# Patient Record
Sex: Female | Born: 1970
Health system: Southern US, Community
[De-identification: ages and names within clinical notes are randomized; demographics above are authoritative.]

## PROBLEM LIST (undated history)

## (undated) DIAGNOSIS — J189 Pneumonia, unspecified organism: Secondary | ICD-10-CM

## (undated) DIAGNOSIS — R0600 Dyspnea, unspecified: Secondary | ICD-10-CM

## (undated) DIAGNOSIS — R29898 Other symptoms and signs involving the musculoskeletal system: Secondary | ICD-10-CM

## (undated) DIAGNOSIS — I452 Bifascicular block: Secondary | ICD-10-CM

## (undated) DIAGNOSIS — Z950 Presence of cardiac pacemaker: Secondary | ICD-10-CM

## (undated) DIAGNOSIS — G7111 Myotonic muscular dystrophy: Secondary | ICD-10-CM

## (undated) DIAGNOSIS — D573 Sickle-cell trait: Secondary | ICD-10-CM

## (undated) DIAGNOSIS — H04203 Unspecified epiphora, bilateral lacrimal glands: Secondary | ICD-10-CM

## (undated) DIAGNOSIS — H269 Unspecified cataract: Secondary | ICD-10-CM

## (undated) DIAGNOSIS — H6120 Impacted cerumen, unspecified ear: Secondary | ICD-10-CM

## (undated) DIAGNOSIS — F329 Major depressive disorder, single episode, unspecified: Secondary | ICD-10-CM

## (undated) DIAGNOSIS — D649 Anemia, unspecified: Secondary | ICD-10-CM

## (undated) DIAGNOSIS — F32A Depression, unspecified: Secondary | ICD-10-CM

## (undated) DIAGNOSIS — E042 Nontoxic multinodular goiter: Secondary | ICD-10-CM

## (undated) HISTORY — DX: Unspecified epiphora, bilateral: H04.203

## (undated) HISTORY — DX: Myotonic muscular dystrophy: G71.11

## (undated) HISTORY — PX: BREAST BIOPSY: SHX20

## (undated) HISTORY — DX: Unspecified cataract: H26.9

## (undated) HISTORY — DX: Impacted cerumen, unspecified ear: H61.20

## (undated) HISTORY — DX: Bifascicular block: I45.2

## (undated) HISTORY — DX: Major depressive disorder, single episode, unspecified: F32.9

## (undated) HISTORY — DX: Other symptoms and signs involving the musculoskeletal system: R29.898

## (undated) HISTORY — DX: Depression, unspecified: F32.A

---

## 2011-04-03 LAB — PULMONARY FUNCTION TEST

## 2014-05-16 ENCOUNTER — Emergency Department (HOSPITAL_COMMUNITY)
Admission: EM | Admit: 2014-05-16 | Discharge: 2014-05-16 | Disposition: A | Discharge status: Home / Self Care | Payer: Medicare (Managed Care) | Attending: Emergency Medicine | Admitting: Emergency Medicine

## 2014-05-16 ENCOUNTER — Encounter (HOSPITAL_COMMUNITY): Payer: Self-pay | Admitting: Emergency Medicine

## 2014-05-16 DIAGNOSIS — N739 Female pelvic inflammatory disease, unspecified: Secondary | ICD-10-CM | POA: Insufficient documentation

## 2014-05-16 DIAGNOSIS — N898 Other specified noninflammatory disorders of vagina: Secondary | ICD-10-CM | POA: Insufficient documentation

## 2014-05-16 DIAGNOSIS — F17200 Nicotine dependence, unspecified, uncomplicated: Secondary | ICD-10-CM | POA: Diagnosis not present

## 2014-05-16 DIAGNOSIS — N730 Acute parametritis and pelvic cellulitis: Secondary | ICD-10-CM

## 2014-05-16 DIAGNOSIS — Z880 Allergy status to penicillin: Secondary | ICD-10-CM | POA: Diagnosis not present

## 2014-05-16 DIAGNOSIS — N939 Abnormal uterine and vaginal bleeding, unspecified: Secondary | ICD-10-CM

## 2014-05-16 DIAGNOSIS — Z3202 Encounter for pregnancy test, result negative: Secondary | ICD-10-CM | POA: Insufficient documentation

## 2014-05-16 DIAGNOSIS — Z8669 Personal history of other diseases of the nervous system and sense organs: Secondary | ICD-10-CM | POA: Diagnosis not present

## 2014-05-16 LAB — CBC WITH DIFFERENTIAL/PLATELET
Basophils Absolute: 0 10*3/uL (ref 0.0–0.1)
Basophils Relative: 0 % (ref 0–1)
Eosinophils Absolute: 0.1 10*3/uL (ref 0.0–0.7)
Eosinophils Relative: 2 % (ref 0–5)
HEMATOCRIT: 39.3 % (ref 36.0–46.0)
Hemoglobin: 12.8 g/dL (ref 12.0–15.0)
LYMPHS PCT: 48 % — AB (ref 12–46)
Lymphs Abs: 2.4 10*3/uL (ref 0.7–4.0)
MCH: 31.1 pg (ref 26.0–34.0)
MCHC: 32.6 g/dL (ref 30.0–36.0)
MCV: 95.4 fL (ref 78.0–100.0)
MONOS PCT: 7 % (ref 3–12)
Monocytes Absolute: 0.3 10*3/uL (ref 0.1–1.0)
NEUTROS ABS: 2.2 10*3/uL (ref 1.7–7.7)
NEUTROS PCT: 43 % (ref 43–77)
Platelets: 307 10*3/uL (ref 150–400)
RBC: 4.12 MIL/uL (ref 3.87–5.11)
RDW: 14.7 % (ref 11.5–15.5)
WBC: 5 10*3/uL (ref 4.0–10.5)

## 2014-05-16 LAB — URINALYSIS, ROUTINE W REFLEX MICROSCOPIC
Bilirubin Urine: NEGATIVE
Glucose, UA: NEGATIVE mg/dL
HGB URINE DIPSTICK: NEGATIVE
Ketones, ur: NEGATIVE mg/dL
Nitrite: NEGATIVE
PROTEIN: NEGATIVE mg/dL
SPECIFIC GRAVITY, URINE: 1.024 (ref 1.005–1.030)
Urobilinogen, UA: 1 mg/dL (ref 0.0–1.0)
pH: 6.5 (ref 5.0–8.0)

## 2014-05-16 LAB — URINE MICROSCOPIC-ADD ON

## 2014-05-16 LAB — COMPREHENSIVE METABOLIC PANEL
ALBUMIN: 3.6 g/dL (ref 3.5–5.2)
ALK PHOS: 50 U/L (ref 39–117)
ALT: 21 U/L (ref 0–35)
AST: 31 U/L (ref 0–37)
Anion gap: 11 (ref 5–15)
BILIRUBIN TOTAL: 0.3 mg/dL (ref 0.3–1.2)
BUN: 15 mg/dL (ref 6–23)
CHLORIDE: 102 meq/L (ref 96–112)
CO2: 27 meq/L (ref 19–32)
CREATININE: 0.6 mg/dL (ref 0.50–1.10)
Calcium: 9.7 mg/dL (ref 8.4–10.5)
GFR calc Af Amer: >90 mL/min (ref >90)
Glucose, Bld: 77 mg/dL (ref 70–99)
POTASSIUM: 4.5 meq/L (ref 3.7–5.3)
Sodium: 140 mEq/L (ref 137–147)
Total Protein: 7.9 g/dL (ref 6.0–8.3)

## 2014-05-16 LAB — PREGNANCY, URINE: PREG TEST UR: NEGATIVE

## 2014-05-16 LAB — WET PREP, GENITAL: YEAST WET PREP: NONE SEEN

## 2014-05-16 MED ORDER — LIDOCAINE HCL (PF) 1 % IJ SOLN
5.0000 mL | Freq: Once | INTRAMUSCULAR | Status: AC
  Administered 2014-05-16: 5 mL
  Filled 2014-05-16: qty 5

## 2014-05-16 MED ORDER — STERILE WATER FOR INJECTION IJ SOLN: 2.1000 mL | Freq: Once | INTRAMUSCULAR | Status: DC

## 2014-05-16 MED ORDER — CEFTRIAXONE SODIUM 250 MG IJ SOLR
250.0000 mg | INTRAMUSCULAR | Status: DC
  Administered 2014-05-16: 250 mg via INTRAMUSCULAR
  Filled 2014-05-16: qty 250

## 2014-05-16 MED ORDER — AZITHROMYCIN 250 MG PO TABS
1000.0000 mg | ORAL_TABLET | Freq: Once | ORAL | Status: AC
  Administered 2014-05-16: 1000 mg via ORAL
  Filled 2014-05-16: qty 4

## 2014-05-16 MED ORDER — DOXYCYCLINE HYCLATE 50 MG PO CAPS: 50.0000 mg | ORAL_CAPSULE | Freq: Two times a day (BID) | ORAL | Status: AC

## 2014-05-16 NOTE — ED Provider Notes (Signed)
CSN: [id number]     Arrival date & time 05/16/14  1600 History   First MD Initiated Contact with Patient 05/16/14 1739     Chief Complaint  Patient presents with  . Vaginal Bleeding    Patient is a 43 y.o. female presenting with vaginal bleeding. The history is provided by the patient.  Vaginal Bleeding Quality:  Lighter than menses Severity:  Mild Onset quality:  Gradual Duration:  2 weeks Timing:  Constant Progression:  Improving Chronicity:  New Menstrual history:  Regular Number of pads used:  4/day initially now 2-3/day Possible pregnancy: no   Context: spontaneously   Context: not foreign body and not genital trauma   Relieved by:  Nothing Worsened by:  Nothing tried Ineffective treatments:  None tried Associated symptoms: no abdominal pain, no back pain, no dizziness, no dysuria, no fatigue, no nausea and no vaginal discharge   Risk factors: no bleeding disorder, no hx of ectopic pregnancy, no gynecological surgery, no new sexual partner, no STD and does not have unprotected sex     Past Medical History  Diagnosis Date  . Myoclonic disorder    History reviewed. No pertinent past surgical history. No family history on file. History  Substance Use Topics  . Smoking status: Current Every Day Smoker  . Smokeless tobacco: Not on file  . Alcohol Use: Yes   OB History   Grav Para Term Preterm Abortions TAB SAB Ect Mult Living                 Review of Systems  Constitutional: Negative for fatigue.  Gastrointestinal: Negative for nausea and abdominal pain.  Genitourinary: Positive for vaginal bleeding. Negative for dysuria and vaginal discharge.  Musculoskeletal: Negative for back pain.  Neurological: Negative for dizziness.      Allergies  Penicillins  Home Medications   Prior to Admission medications   Not on File   BP 115/70  Pulse 89  Temp(Src) 97.9 F (36.6 C) (Oral)  Resp 20  Ht 5\' 3"  (1.6 m)  Wt 110 lb (49.896 kg)  BMI 19.49 kg/m2  SpO2 97%   LMP 04/15/2014 Physical Exam  Nursing note and vitals reviewed. Constitutional: She is oriented to person, place, and time. She appears well-developed and well-nourished. No distress.  thin  HENT:  Head: Atraumatic.  Nose: Nose normal.  Myotonic facial features  Eyes:  Conj pallor  Neck: Normal range of motion. Neck supple. No tracheal deviation present.  Cardiovascular: Normal rate, regular rhythm and normal heart sounds.   No murmur heard. Pulmonary/Chest: Effort normal and breath sounds normal. No respiratory distress. She has no rales.  Abdominal: Soft. Bowel sounds are normal. She exhibits no distension and no mass. There is no tenderness.  Genitourinary:  Cervix with exudate and erythema. Scant discharge around cervix but not from os. No bleeding noted. Vagina normal otherwise.  +CMT, no adnexal fullness or TTP.   Musculoskeletal: Normal range of motion. She exhibits no edema.  Neurological: She is alert and oriented to person, place, and time.  Skin: Skin is warm and dry. No rash noted.  Psychiatric: She has a normal mood and affect.    ED Course  Procedures (including critical care time) Labs Review Labs Reviewed  WET PREP, GENITAL - Abnormal; Notable for the following:    Trich, Wet Prep MANY (*)    Clue Cells Wet Prep HPF POC MANY (*)    WBC, Wet Prep HPF POC MANY (*)    All other components  within normal limits  URINALYSIS, ROUTINE W REFLEX MICROSCOPIC - Abnormal; Notable for the following:    Color, Urine AMBER (*)    APPearance CLOUDY (*)    Leukocytes, UA MODERATE (*)    All other components within normal limits  CBC WITH DIFFERENTIAL - Abnormal; Notable for the following:    Lymphocytes Relative 48 (*)    All other components within normal limits  URINE MICROSCOPIC-ADD ON - Abnormal; Notable for the following:    Squamous Epithelial / LPF FEW (*)    All other components within normal limits  GC/CHLAMYDIA PROBE AMP  PREGNANCY, URINE  COMPREHENSIVE  METABOLIC PANEL    Imaging Review No results found.   EKG Interpretation None      MDM   Final diagnoses:  Vaginal bleeding  PID (acute pelvic inflammatory disease)    VSS, well appearing.  No hx of similar bleeding, or coagulopathy.  Conj pallor but no anemia on labs.  Abd soft NDNT, no signs of systemic toxicity or peritonitis.  Preg neg. UA equivocal for infection send cultures, will treat. Pelvic exam with discharge at os and cervical erythema and CMT. No sign of Rochele Raring.  Pt is sexually active. Treat for PID w/ Rocephin and azithrmycin, home with doxycycline. F/u with Gyn.      Tammy Sours, MD 05/17/14 [phone]

## 2014-05-16 NOTE — ED Notes (Signed)
Dr. Mingo Amber into room, at [facility].

## 2014-05-16 NOTE — ED Notes (Signed)
The pt is c/o vaginal bleeding for the past 2 1/2 weeks.  Her last normal period was aug 12th.she has minor abd cramps.  Hx of  Myoclonic dystrophy

## 2014-05-17 NOTE — ED Provider Notes (Signed)
I saw and evaluated the patient, reviewed the resident's note and I agree with the findings and plan.   EKG Interpretation None        Evelina Bucy, MD 05/17/14 0107

## 2014-05-18 LAB — GC/CHLAMYDIA PROBE AMP
CT Probe RNA: NEGATIVE
GC PROBE AMP APTIMA: NEGATIVE

## 2014-07-21 ENCOUNTER — Encounter (HOSPITAL_COMMUNITY): Payer: Self-pay | Admitting: Emergency Medicine

## 2014-07-21 ENCOUNTER — Emergency Department (HOSPITAL_COMMUNITY)
Admission: EM | Admit: 2014-07-21 | Discharge: 2014-07-22 | Disposition: A | Discharge status: Home / Self Care | Payer: Medicare (Managed Care) | Attending: Emergency Medicine | Admitting: Emergency Medicine

## 2014-07-21 DIAGNOSIS — D649 Anemia, unspecified: Secondary | ICD-10-CM | POA: Diagnosis not present

## 2014-07-21 DIAGNOSIS — H9201 Otalgia, right ear: Secondary | ICD-10-CM | POA: Diagnosis not present

## 2014-07-21 DIAGNOSIS — J029 Acute pharyngitis, unspecified: Secondary | ICD-10-CM | POA: Insufficient documentation

## 2014-07-21 DIAGNOSIS — Z720 Tobacco use: Secondary | ICD-10-CM | POA: Insufficient documentation

## 2014-07-21 DIAGNOSIS — Z880 Allergy status to penicillin: Secondary | ICD-10-CM | POA: Diagnosis not present

## 2014-07-21 DIAGNOSIS — H6121 Impacted cerumen, right ear: Secondary | ICD-10-CM | POA: Insufficient documentation

## 2014-07-21 DIAGNOSIS — G253 Myoclonus: Secondary | ICD-10-CM | POA: Insufficient documentation

## 2014-07-21 HISTORY — DX: Anemia, unspecified: D64.9

## 2014-07-21 LAB — RAPID STREP SCREEN (MED CTR MEBANE ONLY): STREPTOCOCCUS, GROUP A SCREEN (DIRECT): NEGATIVE

## 2014-07-21 NOTE — ED Notes (Signed)
Pt. Reports HA, sore throat, and right sided ear pain.

## 2014-07-21 NOTE — ED Notes (Signed)
The patient has multiple complaints.  She is compalining of sore throat, headache and ear pain.  She has been having the headaches for months, but what brought her in was the sore throat and ear pain.  She is not sleeping well at night due to her sore throat so she decided to come in.

## 2014-07-22 MED ORDER — DOCUSATE SODIUM 50 MG/5ML PO LIQD
50.0000 mg | Freq: Once | ORAL | Status: AC
  Administered 2014-07-22: 50 mg via ORAL
  Filled 2014-07-22: qty 10

## 2014-07-22 MED ORDER — HYDROCODONE-ACETAMINOPHEN 7.5-325 MG/15ML PO SOLN
10.0000 mL | Freq: Once | ORAL | Status: AC
  Administered 2014-07-22: 10 mL via ORAL
  Filled 2014-07-22: qty 15

## 2014-07-22 MED ORDER — IBUPROFEN 600 MG PO TABS: 600.0000 mg | ORAL_TABLET | Freq: Four times a day (QID) | ORAL | Status: DC | PRN

## 2014-07-22 MED ORDER — DEXAMETHASONE 4 MG PO TABS
10.0000 mg | ORAL_TABLET | Freq: Once | ORAL | Status: AC
  Administered 2014-07-22: 10 mg via ORAL
  Filled 2014-07-22: qty 3

## 2014-07-22 NOTE — ED Notes (Signed)
Dr. Horton at bedside. 

## 2014-07-22 NOTE — Discharge Instructions (Signed)
Pharyngitis Pharyngitis is redness, pain, and swelling (inflammation) of your pharynx.  CAUSES  Pharyngitis is usually caused by infection. Most of the time, these infections are from viruses (viral) and are part of a cold. However, sometimes pharyngitis is caused by bacteria (bacterial). Pharyngitis can also be caused by allergies. Viral pharyngitis may be spread from person to person by coughing, sneezing, and personal items or utensils (cups, forks, spoons, toothbrushes). Bacterial pharyngitis may be spread from person to person by more intimate contact, such as kissing.  SIGNS AND SYMPTOMS  Symptoms of pharyngitis include:   Sore throat.   Tiredness (fatigue).   Low-grade fever.   Headache.  Joint pain and muscle aches.  Skin rashes.  Swollen lymph nodes.  Plaque-like film on throat or tonsils (often seen with bacterial pharyngitis). DIAGNOSIS  Your health care provider will ask you questions about your illness and your symptoms. Your medical history, along with a physical exam, is often all that is needed to diagnose pharyngitis. Sometimes, a rapid strep test is done. Other lab tests may also be done, depending on the suspected cause.  TREATMENT  Viral pharyngitis will usually get better in 3-4 days without the use of medicine. Bacterial pharyngitis is treated with medicines that kill germs (antibiotics).  HOME CARE INSTRUCTIONS   Drink enough water and fluids to keep your urine clear or pale yellow.   Only take over-the-counter or prescription medicines as directed by your health care provider:   If you are prescribed antibiotics, make sure you finish them even if you start to feel better.   Do not take aspirin.   Get lots of rest.   Gargle with 8 oz of salt water ( tsp of salt per 1 qt of water) as often as every 1-2 hours to soothe your throat.   Throat lozenges (if you are not at risk for choking) or sprays may be used to soothe your throat. SEEK MEDICAL  CARE IF:   You have large, tender lumps in your neck.  You have a rash.  You cough up green, yellow-brown, or bloody spit. SEEK IMMEDIATE MEDICAL CARE IF:   Your neck becomes stiff.  You drool or are unable to swallow liquids.  You vomit or are unable to keep medicines or liquids down.  You have severe pain that does not go away with the use of recommended medicines.  You have trouble breathing (not caused by a stuffy nose). MAKE SURE YOU:   Understand these instructions.  Will watch your condition.  Will get help right away if you are not doing well or get worse. Document Released: 08/21/2005 Document Revised: 06/11/2013 Document Reviewed: 04/28/2013 [facility] Patient Information 2015 [place], [place]. This information is not intended to replace advice given to you by your health care provider. Make sure you discuss any questions you have with your health care provider. Cerumen Impaction A cerumen impaction is when the wax in your ear forms a plug. This plug usually causes reduced hearing. Sometimes it also causes an earache or dizziness. Removing a cerumen impaction can be difficult and painful. The wax sticks to the ear canal. The canal is sensitive and bleeds easily. If you try to remove a heavy wax buildup with a cotton tipped swab, you may push it in further. Irrigation with water, suction, and small ear curettes may be used to clear out the wax. If the impaction is fixed to the skin in the ear canal, ear drops may be needed for a few days to  loosen the wax. People who build up a lot of wax frequently can use ear wax removal products available in your local drugstore. SEEK MEDICAL CARE IF:  You develop an earache, increased hearing loss, or marked dizziness. Document Released: 09/28/2004 Document Revised: 11/13/2011 Document Reviewed: 11/18/2009 [facility] Patient Information 2015 [place], [place]. This information is not intended to replace advice given to you by your health care  provider. Make sure you discuss any questions you have with your health care provider.

## 2014-07-22 NOTE — ED Provider Notes (Signed)
CSN: [id number]     Arrival date & time 07/21/14  2103 History   First MD Initiated Contact with Patient 07/21/14 2335     Chief Complaint  Patient presents with  . Sore Throat    The patient has multiple complaints.  She is compalining of sore throat, headache and ear pain.     (Consider location/radiation/quality/duration/timing/severity/associated sxs/prior Treatment) HPI  This is a 43 year old female with a history of myoclonic dystrophy who presents with sore throat, earache, and headache. Patient has a history of chronic headaches. She reports over the last week she has had worsening sore throat and right ear pain. She states that she lost her voice this morning. She denies any fevers. She has noted rhinorrhea and watery eyes. She denies any cough. Currently her pain is 10 out of 10. She denies difficulty swallowing. She's not taken anything for her symptoms.  Past Medical History  Diagnosis Date  . Myoclonic disorder   . Anemia    History reviewed. No pertinent past surgical history. History reviewed. No pertinent family history. History  Substance Use Topics  . Smoking status: Current Every Day Smoker -- 0.10 packs/day  . Smokeless tobacco: Not on file  . Alcohol Use: Yes   OB History    No data available     Review of Systems  Constitutional: Negative for fever.  HENT: Positive for ear pain and sore throat. Negative for trouble swallowing.   Respiratory: Negative for cough, chest tightness and shortness of breath.   Cardiovascular: Negative for chest pain.  Gastrointestinal: Negative for nausea, vomiting and abdominal pain.  Genitourinary: Negative for dysuria.  Neurological: Positive for headaches.  All other systems reviewed and are negative.     Allergies  Penicillins  Home Medications   Prior to Admission medications   Medication Sig Start Date End Date Taking? Authorizing Provider  ibuprofen (ADVIL,MOTRIN) 600 MG tablet Take 1 tablet (600 mg total)  by mouth every 6 (six) hours as needed. 07/22/14   Merryl Hacker, MD   BP 115/68 mmHg  Pulse 67  Temp(Src) 98.7 F (37.1 C) (Oral)  Resp 185  Wt 106 lb 3 oz (48.166 kg)  SpO2 99%  LMP 06/20/2014 Physical Exam  Constitutional: She is oriented to person, place, and time. She appears well-developed and well-nourished.  HENT:  Head: Normocephalic and atraumatic.  Left Ear: External ear normal.  Mouth/Throat: Oropharynx is clear and moist. No oropharyngeal exudate.  Complete occlusion of the right ear canal with cerumen, bilateral symmetric tonsillar enlargement, mild erythema, no exudate  Eyes: Pupils are equal, round, and reactive to light.  Neck: Normal range of motion. Neck supple.  Cardiovascular: Normal rate, regular rhythm and normal heart sounds.   No murmur heard. Pulmonary/Chest: Effort normal and breath sounds normal. No respiratory distress. She has no wheezes.  Abdominal: Soft. Bowel sounds are normal.  Lymphadenopathy:    She has cervical adenopathy.  Neurological: She is alert and oriented to person, place, and time.  Skin: Skin is warm and dry.  Psychiatric: She has a normal mood and affect.  Nursing note and vitals reviewed.   ED Course  EAR CERUMEN REMOVAL Date/Time: 07/22/2014 1:46 AM Performed by: Thayer Jew, F Authorized by: Thayer Jew, F Consent: Verbal consent obtained. Risks and benefits: risks, benefits and alternatives were discussed Consent given by: patient Patient identity confirmed: verbally with patient Time out: Immediately prior to procedure a "time out" was called to verify the correct patient, procedure, equipment, support staff and  site/side marked as required. Ceruminolytics applied: Ceruminolytics applied prior to the procedure. Location details: right ear Procedure type: curette and irrigation Patient sedated: no Patient tolerance: Patient tolerated the procedure well with no immediate complications   (including critical  care time) Labs Review Labs Reviewed  RAPID STREP SCREEN  CULTURE, GROUP A STREP    Imaging Review No results found.   EKG Interpretation None      MDM   Final diagnoses:  Pharyngitis  Cerumen impaction, right  Otalgia, right    Patient presents with complaints most consistent with viral illness. She reports sore throat and voice changes. Also reports otalgia.  She has serum impaction on the right. This was removed and eardrum looks normal. Pain may be secondary to impaction. Regarding sore throat and voice change, no obvious exudate on exam. Patient does have symmetric tonsillar enlargement. We'll give Decadron and high set. Patient does report improvement of symptoms with these medications. Suspect viral etiology. Discuss with patient supportive care at home with ibuprofen and over-the-counter medications. Patient stated understanding.  After history, exam, and medical workup I feel the patient has been appropriately medically screened and is safe for discharge home. Pertinent diagnoses were discussed with the patient. Patient was given return precautions.     Merryl Hacker, MD 07/22/14 [phone]

## 2014-07-24 LAB — CULTURE, GROUP A STREP

## 2014-08-11 ENCOUNTER — Encounter: Payer: Self-pay | Admitting: Internal Medicine

## 2014-08-11 ENCOUNTER — Ambulatory Visit: Payer: Medicare (Managed Care) | Attending: Internal Medicine | Admitting: Internal Medicine

## 2014-08-11 ENCOUNTER — Ambulatory Visit (HOSPITAL_BASED_OUTPATIENT_CLINIC_OR_DEPARTMENT_OTHER): Payer: Medicare (Managed Care) | Admitting: *Deleted

## 2014-08-11 VITALS — BP 96/64 | HR 75 | Temp 98.0°F | Resp 16 | Ht 62.0 in | Wt 105.0 lb

## 2014-08-11 DIAGNOSIS — H6123 Impacted cerumen, bilateral: Secondary | ICD-10-CM | POA: Diagnosis not present

## 2014-08-11 DIAGNOSIS — G7111 Myotonic muscular dystrophy: Secondary | ICD-10-CM | POA: Diagnosis present

## 2014-08-11 DIAGNOSIS — R05 Cough: Secondary | ICD-10-CM | POA: Insufficient documentation

## 2014-08-11 DIAGNOSIS — R0683 Snoring: Secondary | ICD-10-CM | POA: Diagnosis not present

## 2014-08-11 DIAGNOSIS — F17200 Nicotine dependence, unspecified, uncomplicated: Secondary | ICD-10-CM | POA: Diagnosis not present

## 2014-08-11 DIAGNOSIS — Z23 Encounter for immunization: Secondary | ICD-10-CM | POA: Insufficient documentation

## 2014-08-11 NOTE — Patient Instructions (Signed)

## 2014-08-11 NOTE — Progress Notes (Signed)
Pt is here to establish care. Pt is requesting referrals and a flu shot. Pt history of myatonia. Pt wants to have a sleep study. Pt states that she has had a terrible cough.

## 2014-08-11 NOTE — Progress Notes (Signed)
Patient ID: Cynthia Underwood, female   DOB: 03-Oct-1970, 43 y.o.   MRN: [id number]  WCH:[id number]  [id number]:[id number]  DOB - Jan 14, 1971  CC:  Chief Complaint  Patient presents with  . Establish Care       HPI: Cynthia Underwood is a 43 y.o. female with a history of Myoclonic Dystrophy and anemia, here today to establish medical care.  Patient reports that she recently moved to [facility] from [place] and referrals to specialist (Neurology, Cardiology, and Orthopedics).  She currently wears bilateral plastic braces on her legs and they are beginning to hurt her feet.  She believes it is time for her to get refitted.  Patient reports that she has been having more difficulty with coughing and snoring at night.  She states that she several of her family members with MD all have sleep apnea.   Patient reports that she was told years ago that she has a abnormal heart beat but her last Echocardiogram was WNL.  Patient reports a family history of heart failure, CHF, and pulmonary sarcoidosis.    Allergies  Allergen Reactions  . Penicillins Nausea And Vomiting, Rash and Other (See Comments)    fever   Past Medical History  Diagnosis Date  . Myoclonic disorder   . Anemia    Current Outpatient Prescriptions on File Prior to Visit  Medication Sig Dispense Refill  . ibuprofen (ADVIL,MOTRIN) 600 MG tablet Take 1 tablet (600 mg total) by mouth every 6 (six) hours as needed. 30 tablet 0   No current facility-administered medications on file prior to visit.   Family History  Problem Relation Age of Onset  . Cancer Father   . Heart disease Father   . Diabetes Maternal Uncle   . Heart disease Maternal Uncle   . Heart disease Paternal Grandmother   . Diabetes Paternal Grandmother   . Hypertension Paternal Grandmother    History   Social History  . Marital Status: Single    Spouse Name: N/A    Number of Children: N/A  . Years of Education: N/A   Occupational History  . Not on file.   Social  History Main Topics  . Smoking status: Current Every Day Smoker -- 0.10 packs/day  . Smokeless tobacco: Not on file  . Alcohol Use: Yes  . Drug Use: No  . Sexual Activity: Not on file   Other Topics Concern  . Not on file   Social History Narrative    Review of Systems  Constitutional: Positive for weight loss (loss of appetite) and malaise/fatigue.  Eyes: Positive for blurred vision.  Respiratory: Positive for cough, shortness of breath and wheezing.   Cardiovascular: Negative.   Gastrointestinal: Positive for abdominal pain and diarrhea (endoscopy 2 years ago). Negative for heartburn, nausea and vomiting.  Genitourinary: Negative.   Musculoskeletal: Positive for myalgias.       Contractures of hands   Neurological: Positive for speech change and headaches. Negative for dizziness, tingling, tremors, seizures and loss of consciousness.  Endo/Heme/Allergies: Negative.   Psychiatric/Behavioral: Positive for depression, memory loss (d/t MD) and substance abuse (marijauna, tobacco).       Objective:   Filed Vitals:   08/11/14 1427  BP: 96/64  Pulse: 75  Temp: 98 F (36.7 C)  Resp: 16    Physical Exam  Constitutional: She is oriented to person, place, and time.  HENT:  Mouth/Throat: Oropharynx is clear and moist.  Bilateral cerumen impaction   Neck: Normal range of motion.  Cardiovascular: Normal  rate, regular rhythm and normal heart sounds.   Pulmonary/Chest: Effort normal and breath sounds normal.  Neurological: She is alert and oriented to person, place, and time.     Lab Results  Component Value Date   WBC 5.0 05/16/2014   HGB 12.8 05/16/2014   HCT 39.3 05/16/2014   MCV 95.4 05/16/2014   PLT 307 05/16/2014   Lab Results  Component Value Date   CREATININE 0.60 05/16/2014   BUN 15 05/16/2014   NA 140 05/16/2014   K 4.5 05/16/2014   CL 102 05/16/2014   CO2 27 05/16/2014    No results found for: HGBA1C Lipid Panel  No results found for: CHOL, TRIG,  HDL, CHOLHDL, VLDL, LDLCALC     Assessment and plan:   Cynthia Underwood was seen today for establish care.  Diagnoses and associated orders for this visit:  Myotonic dystrophy, type 1 - Ambulatory referral to Cardiology - Ambulatory referral to Neurology - Ambulatory referral to Orthopedic Surgery - Ambulatory referral to Gynecology--Patient prefers GYN due to history of her disease - Ambulatory referral to Sleep Studies  Need for influenza vaccination Influenza injection received.  Explained side effects and contraindications to patient. Information sheet given to patient.    Return in about 2 weeks (around 08/25/2014) for physical.      Chari Manning, [facility] [phone] 08/11/2014, 3:05 PM

## 2014-08-18 ENCOUNTER — Encounter: Payer: Self-pay | Admitting: Obstetrics & Gynecology

## 2014-08-26 ENCOUNTER — Other Ambulatory Visit: Payer: Self-pay | Admitting: Internal Medicine

## 2014-08-26 ENCOUNTER — Encounter: Payer: Self-pay | Admitting: Internal Medicine

## 2014-08-26 ENCOUNTER — Other Ambulatory Visit (HOSPITAL_COMMUNITY)
Admission: RE | Admit: 2014-08-26 | Discharge: 2014-08-26 | Disposition: A | Discharge status: Home / Self Care | Payer: Medicare (Managed Care) | Source: Ambulatory Visit | Attending: Internal Medicine | Admitting: Internal Medicine

## 2014-08-26 ENCOUNTER — Ambulatory Visit: Payer: Medicare (Managed Care) | Attending: Internal Medicine | Admitting: Internal Medicine

## 2014-08-26 VITALS — BP 98/64 | HR 81 | Temp 98.3°F | Resp 16 | Ht 62.0 in | Wt 103.0 lb

## 2014-08-26 DIAGNOSIS — F17200 Nicotine dependence, unspecified, uncomplicated: Secondary | ICD-10-CM

## 2014-08-26 DIAGNOSIS — E785 Hyperlipidemia, unspecified: Secondary | ICD-10-CM | POA: Diagnosis not present

## 2014-08-26 DIAGNOSIS — Z01419 Encounter for gynecological examination (general) (routine) without abnormal findings: Secondary | ICD-10-CM | POA: Diagnosis not present

## 2014-08-26 DIAGNOSIS — Z0001 Encounter for general adult medical examination with abnormal findings: Secondary | ICD-10-CM | POA: Diagnosis present

## 2014-08-26 DIAGNOSIS — F17210 Nicotine dependence, cigarettes, uncomplicated: Secondary | ICD-10-CM | POA: Diagnosis not present

## 2014-08-26 DIAGNOSIS — N760 Acute vaginitis: Secondary | ICD-10-CM | POA: Diagnosis present

## 2014-08-26 DIAGNOSIS — N63 Unspecified lump in breast: Secondary | ICD-10-CM | POA: Diagnosis not present

## 2014-08-26 DIAGNOSIS — Z0000 Encounter for general adult medical examination without abnormal findings: Secondary | ICD-10-CM

## 2014-08-26 DIAGNOSIS — A599 Trichomoniasis, unspecified: Secondary | ICD-10-CM

## 2014-08-26 DIAGNOSIS — Z720 Tobacco use: Secondary | ICD-10-CM

## 2014-08-26 DIAGNOSIS — A64 Unspecified sexually transmitted disease: Secondary | ICD-10-CM

## 2014-08-26 DIAGNOSIS — Z1151 Encounter for screening for human papillomavirus (HPV): Secondary | ICD-10-CM | POA: Diagnosis present

## 2014-08-26 DIAGNOSIS — R636 Underweight: Secondary | ICD-10-CM | POA: Diagnosis not present

## 2014-08-26 DIAGNOSIS — Z833 Family history of diabetes mellitus: Secondary | ICD-10-CM | POA: Diagnosis not present

## 2014-08-26 DIAGNOSIS — Z681 Body mass index (BMI) 19 or less, adult: Secondary | ICD-10-CM | POA: Insufficient documentation

## 2014-08-26 DIAGNOSIS — Z113 Encounter for screening for infections with a predominantly sexual mode of transmission: Secondary | ICD-10-CM | POA: Diagnosis present

## 2014-08-26 DIAGNOSIS — N6320 Unspecified lump in the left breast, unspecified quadrant: Secondary | ICD-10-CM

## 2014-08-26 DIAGNOSIS — Z124 Encounter for screening for malignant neoplasm of cervix: Secondary | ICD-10-CM | POA: Insufficient documentation

## 2014-08-26 DIAGNOSIS — R7309 Other abnormal glucose: Secondary | ICD-10-CM | POA: Diagnosis not present

## 2014-08-26 NOTE — Progress Notes (Signed)
Patient ID: Cynthia Underwood, female   DOB: December 08, 1970, 43 y.o.   MRN: [id number]  CC: annual physical  HPI: Cynthia Underwood is a 43 y.o. female here today for a follow up visit.  Patient has past medical history of MD and anemia.  She presents today for a physical and pap smear. She is not due for a mammogram until later next year.  She reports that she would like a referral to GYN because she was told that d/t her MD she will have elevated FSH/LH.    Patient has No headache, No chest pain, No abdominal pain - No Nausea, No new weakness tingling or numbness, No Cough - SOB.  Allergies  Allergen Reactions  . Penicillins Nausea And Vomiting, Rash and Other (See Comments)    fever   Past Medical History  Diagnosis Date  . Myoclonic disorder   . Anemia    Current Outpatient Prescriptions on File Prior to Visit  Medication Sig Dispense Refill  . ibuprofen (ADVIL,MOTRIN) 600 MG tablet Take 1 tablet (600 mg total) by mouth every 6 (six) hours as needed. (Patient not taking: Reported on 08/26/2014) 30 tablet 0   No current facility-administered medications on file prior to visit.   Family History  Problem Relation Age of Onset  . Cancer Father   . Heart disease Father   . Diabetes Maternal Uncle   . Heart disease Maternal Uncle   . Heart disease Paternal Grandmother   . Diabetes Paternal Grandmother   . Hypertension Paternal Grandmother    History   Social History  . Marital Status: Single    Spouse Name: N/A    Number of Children: N/A  . Years of Education: N/A   Occupational History  . Not on file.   Social History Main Topics  . Smoking status: Current Every Day Smoker -- 0.10 packs/day  . Smokeless tobacco: Not on file  . Alcohol Use: Yes  . Drug Use: No  . Sexual Activity: Not on file   Other Topics Concern  . Not on file   Social History Narrative    Review of Systems: Constitutional: Negative for fever, chills, diaphoresis, activity change, appetite change  and fatigue. HENT: Negative for ear pain, nosebleeds, congestion, facial swelling, rhinorrhea, neck pain, neck stiffness and ear discharge.  Eyes: Negative for pain, discharge, redness, itching and visual disturbance. Respiratory: Negative for cough, choking, chest tightness, shortness of breath, wheezing and stridor.  Cardiovascular: Negative for chest pain, palpitations and leg swelling. Gastrointestinal: Negative for abdominal distention. Genitourinary: Negative for dysuria, urgency, frequency, hematuria, flank pain, decreased urine volume, difficulty urinating and dyspareunia.  Musculoskeletal: Negative for back pain, joint swelling, arthralgias and gait problem. Neurological: Negative for dizziness, tremors, seizures, syncope, facial asymmetry, speech difficulty, weakness, light-headedness, numbness and headaches.  Hematological: Negative for adenopathy. Does not bruise/bleed easily. Psychiatric/Behavioral: Negative for hallucinations, behavioral problems, confusion, dysphoric mood, decreased concentration and agitation.    Objective:   Filed Vitals:   08/26/14 1414  BP: 98/64  Pulse: 81  Temp: 98.3 F (36.8 C)  Resp: 16    Physical Exam  Constitutional: She is oriented to person, place, and time.  HENT:  Right Ear: External ear normal.  Left Ear: External ear normal.  Mouth/Throat: Oropharynx is clear and moist.  Eyes: EOM are normal. Pupils are equal, round, and reactive to light.  Neck: Normal range of motion. No thyromegaly present.  Cardiovascular: Normal rate, regular rhythm and normal heart sounds.   Pulmonary/Chest: Effort normal  and breath sounds normal. Right breast exhibits no mass, no nipple discharge and no tenderness. Left breast exhibits mass and tenderness. Left breast exhibits no nipple discharge.    Abdominal: Soft. Bowel sounds are normal. She exhibits no distension. There is no tenderness.  Genitourinary: Uterus normal. Vaginal discharge found.    Musculoskeletal: Normal range of motion. She exhibits no edema or tenderness.  Lymphadenopathy:    She has no cervical adenopathy.  Neurological: She is alert and oriented to person, place, and time.  Skin: Skin is warm and dry.  Psychiatric: She has a normal mood and affect.     Lab Results  Component Value Date   WBC 5.0 05/16/2014   HGB 12.8 05/16/2014   HCT 39.3 05/16/2014   MCV 95.4 05/16/2014   PLT 307 05/16/2014   Lab Results  Component Value Date   CREATININE 0.60 05/16/2014   BUN 15 05/16/2014   NA 140 05/16/2014   K 4.5 05/16/2014   CL 102 05/16/2014   CO2 27 05/16/2014    No results found for: HGBA1C Lipid Panel  No results found for: CHOL, TRIG, HDL, CHOLHDL, VLDL, LDLCALC     Assessment and plan:   Cynthia Underwood was seen today for follow-up.  Diagnoses and associated orders for this visit:  Breast mass, left -: MM Digital Diagnostic Unilat L; Future.  She will need to obtain previous mammogram for comparison   Annual physical exam - Lipid panel - Cytology - PAP [place] - Cervicovaginal ancillary only - RPR - Cervicovaginal ancillary only  HLD (hyperlipidemia) - Lipid panel  Underweight - TSH  Abnormal glucose - Hemoglobin A1c  STD (female) - HIV antibody (with reflex). Possible exposure to STI  Family history of diabetes mellitus (DM) - Hemoglobin A1c  Smoking Smoking cessation discussed for 3 minutes, patient is not willing to quit at this time. Will continue to assess on each visit. Discussed increased risk for diseases such as cancer, heart disease, and stroke.    Return if symptoms worsen or fail to improve.        Chari Manning, NP-C [facility] [phone] 08/26/2014, 2:25 PM

## 2014-08-26 NOTE — Progress Notes (Signed)
Pt is here today for a pap and physical only.

## 2014-08-26 NOTE — Patient Instructions (Signed)

## 2014-08-27 ENCOUNTER — Other Ambulatory Visit: Payer: Self-pay

## 2014-08-27 ENCOUNTER — Telehealth: Payer: Self-pay

## 2014-08-27 LAB — LIPID PANEL
CHOL/HDL RATIO: 3.5 ratio
Cholesterol: 205 mg/dL — ABNORMAL HIGH (ref 0–200)
HDL: 59 mg/dL (ref >39)
LDL Cholesterol: 126 mg/dL — ABNORMAL HIGH (ref 0–99)
Triglycerides: 102 mg/dL (ref <150)
VLDL: 20 mg/dL (ref 0–40)

## 2014-08-27 LAB — HEMOGLOBIN A1C
Hgb A1c MFr Bld: 5.6 %
Mean Plasma Glucose: 114 mg/dL

## 2014-08-27 LAB — CERVICOVAGINAL ANCILLARY ONLY
Chlamydia: NEGATIVE
Neisseria Gonorrhea: NEGATIVE
Wet Prep (BD Affirm): NEGATIVE
Wet Prep (BD Affirm): POSITIVE — AB
Wet Prep (BD Affirm): POSITIVE — AB

## 2014-08-27 LAB — RPR

## 2014-08-27 LAB — CYTOLOGY - PAP

## 2014-08-27 LAB — TSH: TSH: 0.472 u[IU]/mL (ref 0.350–4.500)

## 2014-08-27 MED ORDER — METRONIDAZOLE 500 MG PO TABS: 500.0000 mg | ORAL_TABLET | Freq: Two times a day (BID) | ORAL | Status: DC

## 2014-08-27 NOTE — Telephone Encounter (Signed)
Attempted to call patient to let her know she tested positive for an STD Patient not available Message was left on voice mail to return our call ASAP Prescription for flagyl sent to her pharmacy Spoke with Danae Chen at [facility] to add HIV antibody test to her labs

## 2014-08-28 LAB — HIV ANTIBODY (ROUTINE TESTING W REFLEX): HIV: NONREACTIVE

## 2014-08-31 ENCOUNTER — Encounter: Payer: Self-pay | Admitting: Diagnostic Neuroimaging

## 2014-08-31 ENCOUNTER — Ambulatory Visit (INDEPENDENT_AMBULATORY_CARE_PROVIDER_SITE_OTHER): Payer: Medicare (Managed Care) | Admitting: Diagnostic Neuroimaging

## 2014-08-31 VITALS — BP 97/60 | HR 56 | Temp 97.0°F | Ht 62.0 in | Wt 106.6 lb

## 2014-08-31 DIAGNOSIS — G7111 Myotonic muscular dystrophy: Secondary | ICD-10-CM

## 2014-08-31 DIAGNOSIS — R269 Unspecified abnormalities of gait and mobility: Secondary | ICD-10-CM

## 2014-08-31 NOTE — Progress Notes (Signed)
[facility]  PATIENT: Cynthia Underwood DOB: [birth year]  REFERRING CLINICIAN: Keck HISTORY FROM: patient and aunt REASON FOR VISIT: new consult    HISTORICAL  CHIEF COMPLAINT:  Chief Complaint  Patient presents with  . 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 Age 18 years old patient had intermittent lock jaw, stiffness in fingers, drooping eyelids, difficulty relaxing muscles. Symptoms progressed over time. Around 2009 patient was diagnosed by genetic testing. Patient has a sister who is also severely affected. Patient's father had symptoms as well as patient's paternal aunt who is with the patient in this office visit. Patient's aunt is managed at [facility] by Dr. Narda Amber. Patient's aunt has 2 children who are also affected.  Patient moved here from [place] recently. She was prescribed ankle-foot orthotics in [place], but patient is not using them due to poor fitting.   REVIEW OF SYSTEMS: Full 14 system review of systems performed and notable only for weight loss fatigue blurred vision shortness of breath or cough snoring trouble swallowing aching muscles joint pain depression too much sleep not asleep decreased energy disinterest in activities difficult swallowing slurred speech weakness headache insomnia snoring easy bruising blurred vision.  ALLERGIES: Allergies  Allergen Reactions  . Penicillins Nausea And Vomiting, Rash and Other (See Comments)    fever    HOME MEDICATIONS: Outpatient Prescriptions Prior to Visit  Medication Sig Dispense Refill  . ibuprofen (ADVIL,MOTRIN) 600 MG tablet Take 1 tablet (600 mg total) by mouth every 6 (six) hours as needed. (Patient not taking: Reported on 08/26/2014) 30 tablet 0  . metroNIDAZOLE (FLAGYL) 500 MG tablet Take 1 tablet (500 mg total) by mouth 2 (two) times daily. 14 tablet 0     No facility-administered medications prior to visit.    PAST MEDICAL HISTORY: Past Medical History  Diagnosis Date  . Myoclonic disorder   . Anemia   . Excess ear wax   . Watery eyes     Left  . Headache   . Weakness of both legs   . Depression     PAST SURGICAL HISTORY: Past Surgical History  Procedure Laterality Date  . Biopsy breast Right     FAMILY HISTORY: Family History  Problem Relation Age of Onset  . Cancer Father   . Heart disease Father   . Diabetes Maternal Uncle   . Heart disease Maternal Uncle   . Heart disease Paternal Grandmother   . Diabetes Paternal Grandmother   . Hypertension Paternal Grandmother     SOCIAL HISTORY:  History   Social History  . Marital Status: Single    Spouse Name: N/A    Number of Children: 0  . Years of Education: 1.5 Collge   Occupational History  . Unemployed Other   Social History Main Topics  . Smoking status: Never Smoker   . Smokeless tobacco: Never Used  . Alcohol Use: 0.0 oz/week    0 Not specified per week  . Drug Use: No  . Sexual Activity: Not on file   Other Topics Concern  . Not on file   Social History Narrative   Patient lives at home with her aunt.   Caffeine use:      PHYSICAL EXAM  Filed Vitals:   08/31/14 1005  BP: 97/60  Pulse: 56  Temp: 97 F (36.1 C)  TempSrc: Oral  Height: 5\' 2"  (1.575 m)  Weight: 106 lb 9.6 oz (48.353 kg)    Body mass index is 19.49 kg/(m^2).   Visual Acuity Screening   Right eye Left eye Both eyes  Without correction: 20/70 20/70   With correction:       No flowsheet data found.  GENERAL EXAM: Patient is in no distress; well developed, nourished and groomed; neck is supple  CARDIOVASCULAR: Regular rate and rhythm, no murmurs, no carotid bruits  NEUROLOGIC: MENTAL STATUS: awake, alert, oriented to person, place and time, recent and remote memory intact, normal attention and concentration, language fluent, comprehension intact, naming  intact, fund of knowledge appropriate CRANIAL NERVE: no papilledema on fundoscopic exam, pupils equal and reactive to light, visual fields full to confrontation, extraocular muscles intact, no nystagmus, facial sensation and strength symmetric, hearing intact, palate elevates symmetrically, uvula midline, shoulder shrug symmetric, tongue midline; HOARSE WET VOICE; MILD PTOSIS MOTOR: normal bulk and tone; MILD GRIP-RELEASE MYOTONIA WITH RIGHT GREATER THAN LEFT HAND; BUE (PROX 3, DISTAL 3+); BLE (HF 3, KE/KF 4, DF 3) SENSORY: normal and symmetric to light touch, temperature, vibration  COORDINATION: finger-nose-finger, fine finger movements SLOW REFLEXES: BUE 2, KNEES 1, ANKLES TRACE GAIT/STATION: narrow based gait; SLOW, FOOT DROP GAIT; CANNOT STAND ON TOES, HEELS; CANNOT TANDEM; ROMBERG NEG    DIAGNOSTIC DATA (LABS, IMAGING, TESTING) - I reviewed patient records, labs, notes, testing and imaging myself where available.  Lab Results  Component Value Date   WBC 5.0 05/16/2014   HGB 12.8 05/16/2014   HCT 39.3 05/16/2014   MCV 95.4 05/16/2014   PLT 307 05/16/2014      Component Value Date/Time   NA 140 05/16/2014 1628   K 4.5 05/16/2014 1628   CL 102 05/16/2014 1628   CO2 27 05/16/2014 1628   GLUCOSE 77 05/16/2014 1628   BUN 15 05/16/2014 1628   CREATININE 0.60 05/16/2014 1628   CALCIUM 9.7 05/16/2014 1628   PROT 7.9 05/16/2014 1628   ALBUMIN 3.6 05/16/2014 1628   AST 31 05/16/2014 1628   ALT 21 05/16/2014 1628   ALKPHOS 50 05/16/2014 1628   BILITOT 0.3 05/16/2014 1628   GFRNONAA >90 05/16/2014 1628   GFRAA >90 05/16/2014 1628   Lab Results  Component Value Date   CHOL 205* 08/26/2014   HDL 59 08/26/2014   LDLCALC 126* 08/26/2014   TRIG 102 08/26/2014   CHOLHDL 3.5 08/26/2014   Lab Results  Component Value Date   HGBA1C 5.6 08/26/2014   No results found for: [id number] Lab Results  Component Value Date   TSH 0.472 08/26/2014      ASSESSMENT AND PLAN  43 y.o.  year old female here with myotonic dystrophy type 1. Progressive, with gait difficulty and falls.   PLAN: - PT evaluation - use rollator walker for fall prevention - will refer patient to Dr. Narda Amber (neuromuscular specialist, [facility]) who also sees patient's aunt Cynthia Underwood)  Orders Placed This Encounter  Procedures  . Ambulatory referral to Physical Therapy   Return if symptoms worsen or fail to improve.    Penni Bombard, MD 79/89/2119, 41:74 AM Certified in Neurology, Neurophysiology and Neuroimaging  [facility] [address] [place], [place] [address] [phone]

## 2014-08-31 NOTE — Patient Instructions (Signed)
I will setup physical therapy. 

## 2014-09-08 ENCOUNTER — Other Ambulatory Visit: Payer: Self-pay | Admitting: Internal Medicine

## 2014-09-08 ENCOUNTER — Ambulatory Visit
Admission: RE | Admit: 2014-09-08 | Discharge: 2014-09-08 | Disposition: A | Discharge status: Home / Self Care | Payer: Medicare Other | Source: Ambulatory Visit | Attending: Internal Medicine | Admitting: Internal Medicine

## 2014-09-08 DIAGNOSIS — N6320 Unspecified lump in the left breast, unspecified quadrant: Secondary | ICD-10-CM

## 2014-09-09 ENCOUNTER — Telehealth: Payer: Self-pay | Admitting: *Deleted

## 2014-09-09 MED ORDER — METRONIDAZOLE 500 MG PO TABS: 500.0000 mg | ORAL_TABLET | Freq: Two times a day (BID) | ORAL | Status: DC

## 2014-09-09 MED ORDER — ATORVASTATIN CALCIUM 10 MG PO TABS: 10.0000 mg | ORAL_TABLET | Freq: Every day | ORAL | Status: DC

## 2014-09-09 NOTE — Telephone Encounter (Signed)
Pt aware of lab results, advised [facility] Advice for partner to be treated. Send report to [facility] department Rx send to [facility]  Notes Recorded by Lance Bosch, NP on 09/03/2014 at 1:37 PM Pap negative for malignancies. Will repeat in 3 years. Make sure she took the medication for Trich Notes Recorded by Tresa Garter, MD on 08/27/2014 at 1:05 PM Please inform patient that her Pap smear results shows infection with Trichomonas and bacterial vaginosis. Trichomonas is a sexually transmitted disease therefore all partners need to be treated at the same time. Please avoid alcohol while on Flagyl. We are still waiting for the Pap smear cytology report.  Please call in prescription metronidazole 500 mg tablet by mouth twice a day for 7 days

## 2014-09-09 NOTE — Telephone Encounter (Signed)
-----   Message from Lance Bosch, NP sent at 08/30/2014  7:37 PM EST ----- Not diabetic,  HIV/Syphilis negative Cholesterol is elevated, please send atorvastatin 10 mg to take daily. Educate on diet and exercise Make sure she completed medication for trich/BV and have notified partners of specific disease so they may receive proper treatment. Dalbert Batman is not commonly tested in men

## 2014-09-09 NOTE — Telephone Encounter (Signed)
Left voice message to return call 

## 2014-09-09 NOTE — Telephone Encounter (Signed)
-----   Message from Lance Bosch, NP sent at 09/03/2014  1:37 PM EST ----- Pap negative for malignancies. Will repeat in 3 years.  Make sure she took the medication for Trich

## 2014-09-14 ENCOUNTER — Ambulatory Visit: Payer: Medicare (Managed Care) | Attending: Diagnostic Neuroimaging

## 2014-09-14 DIAGNOSIS — G7111 Myotonic muscular dystrophy: Secondary | ICD-10-CM | POA: Diagnosis not present

## 2014-09-14 DIAGNOSIS — R269 Unspecified abnormalities of gait and mobility: Secondary | ICD-10-CM | POA: Diagnosis not present

## 2014-09-14 DIAGNOSIS — R531 Weakness: Secondary | ICD-10-CM | POA: Insufficient documentation

## 2014-09-14 NOTE — Therapy (Signed)
[facility] [address] [place], [place], [address] Phone: [phone]   Fax:  [phone]  Physical Therapy Evaluation  Patient Details  Name: Cynthia Underwood MRN: [id number] Date of Birth: 25-Aug-1971 Referring Provider:  Penni Bombard, MD  Encounter Date: 09/14/2014      PT End of Session - 09/14/14 1709    Visit Number 1   Number of Visits 17   Date for PT Re-Evaluation 11/13/14   Authorization Type G-code every 10th visit.   PT Start Time 1316   PT Stop Time 1359   PT Time Calculation (min) 43 min   Equipment Utilized During Treatment Gait belt   Activity Tolerance Patient tolerated treatment well   Behavior During Therapy WFL for tasks assessed/performed      Past Medical History  Diagnosis Date  . Myoclonic disorder   . Anemia   . Excess ear wax   . Watery eyes     Left  . Headache   . Weakness of both legs   . Depression     Past Surgical History  Procedure Laterality Date  . Biopsy breast Right     There were no vitals taken for this visit.  Visit Diagnosis:  Abnormality of gait - Plan: PT plan of care cert/re-cert  General weakness - Plan: PT plan of care cert/re-cert      Subjective Assessment - 09/14/14 1329    Symptoms impaired balance, falls, B LE weakness, B UE weakness   Pertinent History Myotonic dystrophy    Patient Stated Goals to strengthen legs and arms, stop falling, be more independent   Currently in Pain? No/denies          [facility] PT Assessment - 09/14/14 0001    Assessment   Medical Diagnosis Myotonic dystrophy-type I   Onset Date 02/03/08   Precautions   Precautions Fall   Precaution Comments fall and no lifting over 10 pounds   Restrictions   Weight Bearing Restrictions No   Balance Screen   Has the patient fallen in the past 6 months Yes   How many times? 3   Has the patient had a decrease in activity level because of a fear of falling?  Yes   Is  the patient reluctant to leave their home because of a fear of falling?  No   Home Environment   Living Enviornment Private residence   Living Arrangements Other relatives  aunt and cousins   Available Help at Discharge Family   Type of [place] to enter   Entrance Stairs-Number of Steps 10   Entrance Stairs-Rails Can reach both   Home Layout Two level   Alternate Level Stairs-Number of Steps 12   Alternate Level Stairs-Rails Right   Home Equipment Other (comment)  B AFOs   Prior Function   Level of Independence Independent with basic ADLs;Independent with gait;Independent with transfers;Independent with homemaking with ambulation   Vocation On disability  former Drug Counselor   Leisure play cards, watch TV   Cognition   Overall Cognitive Status Impaired/Different from baseline   Memory Impaired   Memory Impairment Decreased long term memory;Decreased short term memory   Sensation   Light Touch Appears Intact   Coordination   Gross Motor Movements are Fluid and Coordinated Yes   Fine Motor Movements are Fluid and Coordinated Yes   Posture/Postural Control   Posture/Postural Control Postural limitations   Postural Limitations Increased lumbar lordosis;Forward head  Tone Assessment - Other   Other Tone Location No tone noted during eval but pt reported B UE/LE "spasms" that are intermittent.   AROM   Overall AROM  Within functional limits for tasks performed   Strength   Overall Strength Deficits   Overall Strength Comments B UE/LE strength grossly 3+/5 to 4/5.   Transfers   Transfers Sit to Stand;Stand to Sit   Sit to Stand 5: Supervision;With armrests;With upper extremity assist;From chair/3-in-1   Stand to Sit 5: Supervision;With armrests;With upper extremity assist;To chair/3-in-1   Ambulation/Gait   Ambulation/Gait Yes   Ambulation/Gait Assistance 4: Min guard   Ambulation/Gait Assistance Details Pt ambulated over even terrain with no LOB  episodes, but noted to turn with narrow BOS.   Ambulation Distance (Feet) 100 Feet   Assistive device None   Gait Pattern Step-through pattern;Decreased hip/knee flexion - right;Decreased hip/knee flexion - left;Decreased stride length;Decreased dorsiflexion - left  narrow BOS during turns, decr. trunk rotation   Gait velocity 2.8ft/sec.   Standardized Balance Assessment   Standardized Balance Assessment Timed Up and Go Test;Berg Balance Test   Berg Balance Test   Sit to Stand Able to stand  independently using hands   Standing Unsupported Able to stand 2 minutes with supervision   Sitting with Back Unsupported but Feet Supported on Floor or Stool Able to sit safely and securely 2 minutes   Stand to Sit Controls descent by using hands   Transfers Able to transfer safely, definite need of hands   Standing Unsupported with Eyes Closed Able to stand 10 seconds with supervision   Standing Ubsupported with Feet Together Needs help to attain position and unable to hold for 15 seconds  pt can only stand for 7 seconds without assist   From Standing, Reach Forward with Outstretched Arm Can reach forward >12 cm safely (5")  5.5"   From Standing Position, Pick up Object from Floor Able to pick up shoe, needs supervision   From Standing Position, Turn to Look Behind Over each Shoulder Turn sideways only but maintains balance   Turn 360 Degrees Needs close supervision or verbal cueing   Standing Unsupported, Alternately Place Feet on Step/Stool Able to complete 4 steps without aid or supervision   Standing Unsupported, One Foot in Front Able to plae foot ahead of the other independently and hold 30 seconds   Standing on One Leg Tries to lift leg/unable to hold 3 seconds but remains standing independently   Total Score 34   Timed Up and Go Test   TUG Normal TUG   Normal TUG (seconds) 15.55  no AD                            PT Short Term Goals - 09/14/14 1713    PT SHORT TERM  GOAL #1   Title Pt will be independent in HEP to improve functional mobility. Target date: 10/12/14.   Status New   PT SHORT TERM GOAL #2   Title Pt will verbalize energy conservation strategies to decrease fatigue and overuse of muscles. Target date: 10/12/14.   Status New   PT SHORT TERM GOAL #3   Title Pt will report no falls in the last 4 weeks to improve safety during ambulation. Target dtae: 10/12/14.   Status New           PT Long Term Goals - 09/14/14 1714    PT LONG TERM GOAL #1  Title Pt will improve BERG balance score to >/=42/56 to reduce falls risk. Target date: 11/09/14.   PT LONG TERM GOAL #2   Title Pt will verbalize understanding of fall prevention strategies to decrease falls risk. Target date: 11/09/14.   Status New   PT LONG TERM GOAL #3   Title Pt will perform TUG, without LRAD, in </=13.5 seconds to decrease falls risk. Target date: 11/09/14.   Status New   PT LONG TERM GOAL #4   Title Pt will ambulate 400', with LRAD, over even/uneven terrain at MOD I level to improve functional mobility. Target date: 11/09/14.   Status New   PT LONG TERM GOAL #5   Title Pt will improve gait speed to >/=2.47ft/sec. to be a Hydrographic surveyor. Target date: 11/09/14.   Status New   Additional Long Term Goals   Additional Long Term Goals Yes   PT LONG TERM GOAL #6   Title Pt will improve FOTO score by 10 points to improve quality of life. Target date: 11/09/14.   Status New               Plan - 09-19-14 1324    Clinical Impression Statement Pt is a 44 y/o female presenting to [facility]PPT neuro with impaired balance, difficulty walking, and falls with history of myotonic dystophy type I. Pt also reported that one cardiologist said that she has an abnormal heart beat and one cardiologist said "she was fine". She reported she has an upcoming appt. with another cardiologist and will inform PT of any changes.  Pt reported her B LE weakness has been getting progressively worse, which she believes  is the reason she is falling. Pt last had PT over a year ago, and was fitted with B AFO (semi-solid), which pt does not wear due to extreme discomfort while AFOs donned. Pt reported she has difficulty with swallowing but is able to perform ADLs independently. Pt reported she fatigues easily.   Pt will benefit from skilled therapeutic intervention in order to improve on the following deficits Abnormal gait;Decreased endurance;Decreased knowledge of use of DME;Decreased balance;Impaired tone;Impaired UE functional use;Decreased strength;Decreased mobility   Rehab Potential Good   PT Frequency 2x / week   PT Duration 8 weeks  pt will likely be able to d/c after 4 weeks, or reduce frequency to 1x/week.   PT Treatment/Interventions ADLs/Self Care Home Management;Gait training;Neuromuscular re-education;Stair training;Biofeedback;Functional mobility training;Patient/family education;Therapeutic activities;Therapeutic exercise;Manual techniques;DME Instruction;Balance training   PT Next Visit Plan Initiate balance/strengthening HEP. Provide pt with energy conservation technique.   Recommended Other Services Speech therapy   Consulted and Agree with Plan of Care Patient          G-Codes - 09/19/2014 1719    Functional Assessment Tool Used BERG: 34/56; gait speed without AD: 2.96ft/sec.; TUG without AD: 15.55sec.   Functional Limitation Mobility: Walking and moving around   Mobility: Walking and Moving Around Current Status [phone]) At least 20 percent but less than 40 percent impaired, limited or restricted   Mobility: Walking and Moving Around Goal Status [phone]) At least 1 percent but less than 20 percent impaired, limited or restricted       Problem List Patient Active Problem List   Diagnosis Date Noted  . Myotonic dystrophy, type 1 09/19/14    Miken Stecher L 19-Sep-2014, 5:21 PM  [facility] [address] [place], [place],  [address] Phone: [phone]   Fax:  [phone]    Geoffry Paradise, PT,DPT 09-19-14  5:21 PM Phone: [phone] Fax: [phone]

## 2014-09-15 DIAGNOSIS — A599 Trichomoniasis, unspecified: Secondary | ICD-10-CM | POA: Insufficient documentation

## 2014-09-23 ENCOUNTER — Ambulatory Visit: Payer: Medicare (Managed Care) | Admitting: Physical Therapy

## 2014-09-23 ENCOUNTER — Encounter: Payer: Self-pay | Admitting: Physical Therapy

## 2014-09-23 DIAGNOSIS — R269 Unspecified abnormalities of gait and mobility: Secondary | ICD-10-CM

## 2014-09-23 DIAGNOSIS — R531 Weakness: Secondary | ICD-10-CM

## 2014-09-23 NOTE — Patient Instructions (Signed)
Instructed pt to pace herself and allow frequent rest breaks with there ex and activity ; avoid excessive fatigue

## 2014-09-23 NOTE — Therapy (Signed)
[facility] [address] [place], [place], [address] Phone: [phone]   Fax:  [phone]  Physical Therapy Treatment  Patient Details  Name: Cynthia Underwood MRN: [id number] Date of Birth: 1971-03-09 Referring Provider:  Lance Bosch, NP  Encounter Date: 09/23/2014      PT End of Session - 09/23/14 1100    Visit Number 2   Number of Visits 17   Date for PT Re-Evaluation 11/13/14   Authorization Type G-code every 10th visit.   PT Start Time 1100   PT Stop Time 1145   PT Time Calculation (min) 45 min   Equipment Utilized During Treatment Gait belt   Activity Tolerance Patient tolerated treatment well;Patient limited by fatigue   Behavior During Therapy WFL for tasks assessed/performed      Past Medical History  Diagnosis Date  . Myoclonic disorder   . Anemia   . Excess ear wax   . Watery eyes     Left  . Headache   . Weakness of both legs   . Depression     Past Surgical History  Procedure Laterality Date  . Biopsy breast Right     There were no vitals taken for this visit.  Visit Diagnosis:  Abnormality of gait  General weakness      Subjective Assessment - 09/23/14 1106    Symptoms Almost fell again/twice; it's coordination vs lightheadness/vertigo;    Currently in Pain? Yes   Pain Score 3    Pain Location Hip   Pain Orientation Anterior   Pain Descriptors / Indicators Aching;Stabbing   Pain Type Acute pain   Pain Onset Today   Aggravating Factors  started hurting after she lost her balance        Therapeutic Exercise Yoga progression; sun salutation, flat back, chair pose, mountain pose x 5  Warrior to [employer]; Lunges in parallel bars 5 bouts Monster walk in bars with red resistance band around knees x 5  Balance; bosu ball both legs and single leg stance x 5 min w/rest                     PT Education - 09/23/14 1249    Education provided Yes   Education Details the ex's today are HEP she can do independently   Person(s) Educated Patient   Methods Demonstration   Comprehension Returned demonstration          PT Short Term Goals - 09/14/14 1713    PT SHORT TERM GOAL #1   Title Pt will be independent in HEP to improve functional mobility. Target date: 10/12/14.   Status New   PT SHORT TERM GOAL #2   Title Pt will verbalize energy conservation strategies to decrease fatigue and overuse of muscles. Target date: 10/12/14.   Status New   PT SHORT TERM GOAL #3   Title Pt will report no falls in the last 4 weeks to improve safety during ambulation. Target dtae: 10/12/14.   Status New           PT Long Term Goals - 09/14/14 1714    PT LONG TERM GOAL #1   Title Pt will improve BERG balance score to >/=42/56 to reduce falls risk. Target date: 11/09/14.   PT LONG TERM GOAL #2   Title Pt will verbalize understanding of fall prevention strategies to decrease falls risk. Target date: 11/09/14.   Status New   PT LONG TERM GOAL #3  Title Pt will perform TUG, without LRAD, in </=13.5 seconds to decrease falls risk. Target date: 11/09/14.   Status New   PT LONG TERM GOAL #4   Title Pt will ambulate 400', with LRAD, over even/uneven terrain at MOD I level to improve functional mobility. Target date: 11/09/14.   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 to be a Hydrographic surveyor. Target date: 11/09/14.   Status New   Additional Long Term Goals   Additional Long Term Goals Yes   PT LONG TERM GOAL #6   Title Pt will improve FOTO score by 10 points to improve quality of life. Target date: 11/09/14.   Status New               Plan - 09/23/14 1148    Clinical Impression Statement Pt fatigues quickly and with fatigue has increased fall risk; she tolerated the ex's we did today well; afterwards she stated she felt good and did not mention her hip pain; her poor control of core and pelvis with  walking sets her up for loss of balance; instructed her to do short bouts of ther ex vs a  straight  half hour of zumba;  better to pause the tape every 2-3 minutes and take multi rest breaks to avoid excessive fatigue   PT Next Visit Plan assess tolerance to today's  ther ex; progress w/ focus on HEP        Problem List Patient Active Problem List   Diagnosis Date Noted  . Infection due to trichomonas vaginalis 09/15/2014  . Myotonic dystrophy, type 1 09/14/2014    Jana Hakim 09/23/2014, 12:52 PM  [facility] [address] [place], [place], [address] Phone: [phone]   Fax:  [phone]

## 2014-09-25 ENCOUNTER — Ambulatory Visit: Payer: Medicare (Managed Care)

## 2014-09-28 ENCOUNTER — Ambulatory Visit: Payer: Medicare (Managed Care)

## 2014-09-28 DIAGNOSIS — R269 Unspecified abnormalities of gait and mobility: Secondary | ICD-10-CM

## 2014-09-28 DIAGNOSIS — R531 Weakness: Secondary | ICD-10-CM

## 2014-09-28 NOTE — Patient Instructions (Addendum)
Perform all balance exercises in corner with chair in front of you for safety:  Feet Together, Varied Arm Positions - Eyes Closed   Stand with feet together and arms at your side. Close eyes and visualize upright position. Hold _10-30___ seconds. Repeat __3__ times per session. Do __1__ sessions per day.  Copyright  VHI. All rights reserved.  Feet Heel-Toe "Tandem", Varied Arm Positions - Eyes Open   With eyes open, right foot directly in front of the other, arms at your side, look straight ahead at a stationary object. Hold _30___ seconds. Repeat with left foot in front. Repeat _3___ times per session. Do __1__ sessions per day.  Copyright  VHI. All rights reserved.  Single Leg - Eyes Open   Holding support, lift right leg while maintaining balance over other leg. Repeat with left leg lifted. Progress to removing hands from support surface for longer periods of time. Hold__10-30__ seconds. Repeat _3___ times per session. Do __1__ sessions per day.  Copyright  VHI. All rights reserved.  Feet Together (Compliant Surface) Head Motion - Eyes Open   Stand on compliant surface: __pillow______ with feet together. Eyes open and move head slowly, up and down and side and side for 30 seconds. Repeat _3___ times per session. Do __1__ sessions per day.  Copyright  VHI. All rights reserved.

## 2014-09-28 NOTE — Therapy (Signed)
[facility] [address] [place], [place], [address] Phone: [phone]   Fax:  [phone]  Physical Therapy Treatment  Patient Details  Name: Cynthia Underwood MRN: [id number] Date of Birth: 10-01-1970 Referring Provider:  Lance Bosch, NP  Encounter Date: 09/28/2014      PT End of Session - 09/28/14 1607    Visit Number 3   Number of Visits 17   Date for PT Re-Evaluation 11/13/14   Authorization Type G-code every 10th visit.   PT Start Time 1530   PT Stop Time 1611   PT Time Calculation (min) 41 min   Equipment Utilized During Treatment Gait belt   Activity Tolerance Patient tolerated treatment well;Patient limited by fatigue   Behavior During Therapy WFL for tasks assessed/performed      Past Medical History  Diagnosis Date  . Myoclonic disorder   . Anemia   . Excess ear wax   . Watery eyes     Left  . Headache   . Weakness of both legs   . Depression     Past Surgical History  Procedure Laterality Date  . Biopsy breast Right     There were no vitals taken for this visit.  Visit Diagnosis:  Abnormality of gait  General weakness      Subjective Assessment - 09/28/14 1532    Symptoms Pt denied falls or changes since last visit. Pt reported she tried Zumba video once but pt was too fatigued to finish it.    Pertinent History Myotonic dystrophy    Patient Stated Goals to strengthen legs and arms, stop falling, be more independent   Currently in Pain? No/denies                    [facility] Adult PT Treatment/Exercise - 09/28/14 1600    Balance   Balance Assessed Yes   Static Standing Balance   Static Standing - Balance Support No upper extremity supported   Static Standing - Level of Assistance 5: Stand by assistance   Static Standing - Comment/# of Minutes Pt performed balance activities in corner with chair in front of pt for safety: B LEs, 1-2 sets with 10-30 second holds on  compliant and non-compliant surfaces: feet together/apart with head turns, with eyes open closed, tandem stance, single leg stance. VC's for technique.   Exercises   Exercises Knee/Hip   Knee/Hip Exercises: Aerobic   Stationary Bike SciFit bike with B LEs only to improve strength and endurance. VC's to keep climb rate >45. Pt required 3, one-minute rest breaks at 1:55, 5:08, and 8:01 due to fatigue.    Therapeutic Exercise: reviewed yoga HEP: Yoga progression; sun salutation, flat back, chair pose, mountain pose x 5  Warrior to [employer], B sides. VC's for technique and to keep hips over ankles during flat back.            PT Education - 09/28/14 1607    Education provided Yes   Education Details Reviewed yoga HEP and provided balancd HEP.   Person(s) Educated Patient   Methods Explanation;Demonstration;Tactile cues;Verbal cues;Handout   Comprehension Verbalized understanding;Returned demonstration          PT Short Term Goals - 09/28/14 1615    PT SHORT TERM GOAL #1   Title Pt will be independent in HEP to improve functional mobility. Target date: 10/12/14.   Status On-going   PT SHORT TERM GOAL #2   Title Pt will verbalize  energy conservation strategies to decrease fatigue and overuse of muscles. Target date: 10/12/14.   Status On-going   PT SHORT TERM GOAL #3   Title Pt will report no falls in the last 4 weeks to improve safety during ambulation. Target dtae: 10/12/14.   Status On-going           PT Long Term Goals - 09/28/14 1615    PT LONG TERM GOAL #1   Title Pt will improve BERG balance score to >/=42/56 to reduce falls risk. Target date: 11/09/14.   Status On-going   PT LONG TERM GOAL #2   Title Pt will verbalize understanding of fall prevention strategies to decrease falls risk. Target date: 11/09/14.   Status On-going   PT LONG TERM GOAL #3   Title Pt will perform TUG, without LRAD, in </=13.5 seconds to decrease falls risk. Target date: 11/09/14.   Status  On-going   PT LONG TERM GOAL #4   Title Pt will ambulate 400', with LRAD, over even/uneven terrain at MOD I level to improve functional mobility. Target date: 11/09/14.   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 to be a Hydrographic surveyor. Target date: 11/09/14.   Status On-going   PT LONG TERM GOAL #6   Title Pt will improve FOTO score by 10 points to improve quality of life. Target date: 11/09/14.   Status On-going               Plan - 09/28/14 1607    Clinical Impression Statement Pt demonstrated progress as she was able to tolerate balance HEP without rest breaks. Pt able to pedal 10 minutes on SciFit bike, but required 3, one-minute, rest breaks due to fatigue. Pt would continue to benefit from skilled PT to improve safety during functional mobility.   Pt will benefit from skilled therapeutic intervention in order to improve on the following deficits Abnormal gait;Decreased endurance;Decreased knowledge of use of DME;Decreased balance;Impaired tone;Impaired UE functional use;Decreased strength;Decreased mobility   Rehab Potential Good   PT Frequency 2x / week   PT Duration 8 weeks   PT Treatment/Interventions ADLs/Self Care Home Management;Gait training;Neuromuscular re-education;Stair training;Biofeedback;Functional mobility training;Patient/family education;Therapeutic activities;Therapeutic exercise;Manual techniques;DME Instruction;Balance training   PT Next Visit Plan Gait training with pt's AFOs (and other AFOs as pt states her AFOs are uncomfortable), dynamic balance training, endurance training as tolerated. Strengthening HEP as time permits.   PT Home Exercise Plan Balance and yoga HEP.   Consulted and Agree with Plan of Care Patient        Problem List Patient Active Problem List   Diagnosis Date Noted  . Infection due to trichomonas vaginalis 09/15/2014  . Myotonic dystrophy, type 1 09/14/2014    Genova Kiner  L 09/28/2014, 4:16 PM  [facility] [address] [place], [place], [address] Phone: [phone]   Fax:  [phone]   Geoffry Paradise, PT,DPT 09/28/2014 4:16 PM Phone: [phone] Fax: [phone]

## 2014-09-29 ENCOUNTER — Ambulatory Visit: Payer: Medicare Other | Admitting: Rehabilitative and Restorative Service Providers"

## 2014-09-30 ENCOUNTER — Encounter: Payer: Self-pay | Admitting: Obstetrics & Gynecology

## 2014-09-30 ENCOUNTER — Encounter: Payer: Medicare Other | Admitting: Obstetrics & Gynecology

## 2014-09-30 NOTE — Progress Notes (Signed)
Patient ID: Cynthia Underwood, female   DOB: [birth year], 44 y.o.   MRN: [id number] Pt states she was referred by [facility] for annual exam but she already had pap smear done and mammogram done there. No GYN concerns.  Patient went home, not charged for visit.  Verita Schneiders, MD, [place] Attending [place], [facility]

## 2014-09-30 NOTE — Patient Instructions (Signed)
Return to clinic for any scheduled appointments or for any gynecologic concerns as needed.   

## 2014-10-02 ENCOUNTER — Encounter: Payer: Self-pay | Admitting: Cardiology

## 2014-10-02 ENCOUNTER — Ambulatory Visit (INDEPENDENT_AMBULATORY_CARE_PROVIDER_SITE_OTHER): Payer: Medicare Other | Admitting: Cardiology

## 2014-10-02 VITALS — BP 92/60 | HR 63 | Ht 62.75 in | Wt 108.8 lb

## 2014-10-02 DIAGNOSIS — R0602 Shortness of breath: Secondary | ICD-10-CM | POA: Insufficient documentation

## 2014-10-02 DIAGNOSIS — D869 Sarcoidosis, unspecified: Secondary | ICD-10-CM

## 2014-10-02 DIAGNOSIS — I452 Bifascicular block: Secondary | ICD-10-CM

## 2014-10-02 DIAGNOSIS — G7111 Myotonic muscular dystrophy: Secondary | ICD-10-CM

## 2014-10-02 HISTORY — DX: Bifascicular block: I45.2

## 2014-10-02 LAB — ANGIOTENSIN CONVERTING ENZYME: Angiotensin-Converting Enzyme: 26 U/L (ref 8–52)

## 2014-10-02 MED ORDER — DIAZEPAM 5 MG PO TABS: ORAL_TABLET | ORAL | Status: DC

## 2014-10-02 NOTE — Progress Notes (Signed)
Cardiology Office Note   Date:  10/02/2014   ID:  Cynthia Underwood, Cynthia Underwood Dec 05, 1970, MRN [id number]  PCP:  Chari Manning, NP  Cardiologist:   Sueanne Margarita, MD   No chief complaint on file.     History of Present Illness: Cynthia Underwood is a 44 y.o. female who presents for evaluation of palpitations.  She has a history of myotonic dystrophy Type I since age 27 with intermittent jaw locking, finger stiffness and difficulty relaxing muscles.  She was diagnosed by genetic testing.  She has a sister who is severely affected and father and paternal aunt with symptoms.  She is followed with Neurology.   anemia and depression and has been having palpitations.  Apparently she has a family history of CHF and pulmonary sarcoidosis as well as sleep apnea.  She had an echo and was told she had an abnormal heart beat.  2D echo and EKG not available.  She recently moved from [place] and had seen a Cardiologist for myotonic dystrophy and has been getting 2D echo yearly. She says that several cardiologists have told her that she has had an abnormal heart beat and wore a heart monitor but dose not know the results - this was back in the summer.  She had chronic SOB that has gotten worse and occurs even when lying down.  She denies any chest pain, LE edema, syncope or palpitations.  Occasionally she has some dizziness.      Past Medical History  Diagnosis Date  . Myoclonic disorder   . Anemia   . Excess ear wax   . Watery eyes     Left  . Headache   . Weakness of both legs   . Depression     Past Surgical History  Procedure Laterality Date  . Biopsy breast Right      Current Outpatient Prescriptions  Medication Sig Dispense Refill  . atorvastatin (LIPITOR) 10 MG tablet Take 1 tablet (10 mg total) by mouth daily. 90 tablet 3   No current facility-administered medications for this visit.    Allergies:   Penicillins    Social History:  The patient  reports that she has been smoking  Cigarettes.  She has been smoking about 0.25 packs per day. She has never used smokeless tobacco. She reports that she drinks alcohol. She reports that she does not use illicit drugs.   Family History:  The patient's family history includes Cancer in her father; Diabetes in her maternal uncle and paternal grandmother; Heart disease in her father, maternal uncle, and paternal grandmother; Hypertension in her paternal grandmother.    ROS:  Please see the history of present illness.   Otherwise, review of systems are positive for none.   All other systems are reviewed and negative.    PHYSICAL EXAM: VS:  LMP 08/11/2014 (Exact Date) , BMI There is no weight on file to calculate BMI. GEN: Well nourished, well developed, in no acute distress HEENT: normal Neck: no JVD, carotid bruits, or masses Cardiac: RRR; no murmurs, rubs, or gallops,no edema  Respiratory:  clear to auscultation bilaterally, normal work of breathing GI: soft, nontender, nondistended, + BS MS: no deformity or atrophy Skin: warm and dry, no rash Neuro:  Strength and sensation are intact Psych: euthymic mood, full affect   EKG:  EKG is ordered today. 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  Myoclonic dystrophy followed by Neuro. She has chronic SOB that she thinks may be worse.  She has been followed by yearly echo in [place].  I will repeat an echo due to  SOB.   2.  Chronic SOB - now worse.  EKG shows RBBB with LAFB but I do not have an old EKG to compare.  I will get her records from [place].  She has a bifasciciular block with family history of sarcoidosis raising the suspicion that she may have sarcoidosis.  I will get an ACE level.   I will get a cardiac MRI with gad to rule out cardiac sarcoid.  She says that she will need valium to have the MRI done.  I will give her a prescription for Valium 5mg  to take the day of the MRI.  I have asked her to have someone drive her to and from the MRI since she will be taking valium.   3.  Irregular heart beat - she has worn an event monitor in the past - I will get the results.  She denies any palpitations or syncope. 4.  Family history of sarcoidosis and myoclonic dystrophy - her dad had a heart transplant she thinks because of sarcoid   Current medicines are reviewed at length with the patient today.  The patient does not have concerns regarding medicines.  The following changes have been made:  no change  Labs/ tests ordered today include: 2D echo /Cardiac MRI    Disposition:   FU with me in 6 months   Signed, Sueanne Margarita, MD  10/02/2014 4:22 PM    [facility] [place], [place], [place]  [address] Phone: [phone]; Fax: [phone]

## 2014-10-02 NOTE — Patient Instructions (Addendum)
Your physician recommends that you return for lab work in: TODAY (ACE Level)  Your physician has recommended you make the following change in your medication:  1) Valium 5 mg Take 1 on arrival for MRI (paper Rx given)  Your physician has requested that you have an echocardiogram. Echocardiography is a painless test that uses sound waves to create images of your heart. It provides your doctor with information about the size and shape of your heart and how well your heart's chambers and valves are working. This procedure takes approximately one hour. There are no restrictions for this procedure.  Your physician has requested that you have a cardiac MRI. Cardiac MRI uses a computer to create images of your heart as its beating, producing both still and moving pictures of your heart and major blood vessels. For further information please visit [email or web address] Please follow the instruction sheet given to you today for more information.  Your physician wants you to follow-up in: 6  Months with Dr.  Radford Pax. You will receive a reminder letter in the mail two months in advance. If you don't receive a letter, please call our office to schedule the follow-up appointment.  Magnetic Resonance Imaging Magnetic resonance imaging (MRI) is a painless test that takes pictures of the inside of your body. This test uses a strong magnet. This test does not use X-rays or radiation. BEFORE THE PROCEDURE  You will be asked to take off all metal. This includes:  Your watch, jewelry, and other metal items.  Some makeup may have very small bits of metal and may need to be taken off.  Braces and fillings normally are not a problem. PROCEDURE  You may be given earplugs or headphones to listen to music. The machine can be noisy.  You might get a shot (injection) with a dye (contrast material) to help the MRI take better pictures.  MRI is done in a tunnel-shaped scanner. You will lie on a table that slides into the  tunnel-shaped scanner. Once inside, you will still be able to talk to the person doing the test.  You will be asked to hold very still. You will be told when you can shift position. You may have to wait a few minutes to make sure the images are readable. AFTER THE PROCEDURE  You may go back to your normal activities right away.  If you got a shot of dye, it will pass naturally through your body within a day.  Your doctor will talk to you about the results. Document Released: 09/23/2010 Document Revised: 01/05/2014 Document Reviewed: 10/16/2013 [facility] Patient Information 2015 [place], [place]. This information is not intended to replace advice given to you by your health care provider. Make sure you discuss any questions you have with your health care provider.

## 2014-10-06 ENCOUNTER — Other Ambulatory Visit: Payer: Self-pay | Admitting: Cardiology

## 2014-10-06 ENCOUNTER — Other Ambulatory Visit: Payer: Self-pay | Admitting: Internal Medicine

## 2014-10-06 DIAGNOSIS — Z1231 Encounter for screening mammogram for malignant neoplasm of breast: Secondary | ICD-10-CM

## 2014-10-07 ENCOUNTER — Ambulatory Visit: Payer: Medicare (Managed Care) | Attending: Diagnostic Neuroimaging

## 2014-10-07 ENCOUNTER — Telehealth: Payer: Self-pay | Admitting: Cardiology

## 2014-10-07 ENCOUNTER — Encounter: Payer: Self-pay | Admitting: Cardiology

## 2014-10-07 DIAGNOSIS — R269 Unspecified abnormalities of gait and mobility: Secondary | ICD-10-CM | POA: Diagnosis present

## 2014-10-07 DIAGNOSIS — G7111 Myotonic muscular dystrophy: Secondary | ICD-10-CM | POA: Insufficient documentation

## 2014-10-07 DIAGNOSIS — R531 Weakness: Secondary | ICD-10-CM | POA: Diagnosis not present

## 2014-10-07 NOTE — Therapy (Signed)
[facility] [address] [place], [place], [address] Phone: [phone]   Fax:  [phone]  Physical Therapy Treatment  Patient Details  Name: Cynthia Underwood MRN: [id number] Date of Birth: [birth year] Referring Provider:  Lance Bosch, NP  Encounter Date: 10/07/2014      PT End of Session - 10/07/14 1207    Visit Number 4   Number of Visits 17   Date for PT Re-Evaluation 11/13/14   Authorization Type G-code every 10th visit.   PT Start Time 1116   PT Stop Time 1200   PT Time Calculation (min) 44 min   Activity Tolerance Patient tolerated treatment well;Patient limited by fatigue   Behavior During Therapy [facility] for tasks assessed/performed      Past Medical History  Diagnosis Date  . Myoclonic disorder   . Anemia   . Excess ear wax   . Watery eyes     Left  . Headache   . Weakness of both legs   . Depression   . Bifascicular block 10/02/2014  . 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    Past Surgical History  Procedure Laterality Date  . Biopsy breast Right     LMP 08/11/2014 (Exact Date)  Visit Diagnosis:  Abnormality of gait  General weakness      Subjective Assessment - 10/07/14 1121    Symptoms Pt arrived 15 minutes late. Pt denied falls since last visit. Pt reported she over did it on Saturday, while moving into new apartment. Pt now has 14 steps inside home, and has to lift leg over baby gate to traverse stairs.   Pertinent History Myotonic dystrophy    Patient Stated Goals to strengthen legs and arms, stop falling, be more independent   Currently in Pain? Yes   Pain Score --  4-5/10   Pain Location Leg   Pain Orientation Right;Left;Posterior;Other (Comment)   Pain Descriptors / Indicators Aching   Pain Type Acute pain   Pain Onset In the past 7 days   Pain Frequency Intermittent   Aggravating Factors  standing up   Pain Relieving Factors  rest and walking      Therex:  B LEs, with VC's and demonstration for technique. Pt required seated rest breaks after each set due to fatigue. -Seated B hip marches x10 without resistance and 2x10 with 3 lb. Ankle weights.  -Seated B hamstring curls x10 with 3 lb. Ankle weight and x10 with 2 lb. Ankle weight.  -Sit<>stand without UE support 2x10. VC's to improve eccentric control. -Mini wall squat in standing 5x3-5 second holds.  -Seated B hip abduction with red theraband 2x10.                      PT Education - 10/07/14 1206    Education provided Yes   Education Details Strengthening HEP. PT educated pt on taking rest breaks in order to avoid fatigue and overuse of muscles.   Person(s) Educated Patient   Methods Explanation;Demonstration;Verbal cues;Handout   Comprehension Verbalized understanding;Returned demonstration          PT Short Term Goals - 09/28/14 1615    PT SHORT TERM GOAL #1   Title Pt will be independent in HEP to improve functional mobility. Target date: 10/12/14.   Status On-going   PT SHORT TERM GOAL #2   Title Pt will verbalize energy conservation strategies to decrease fatigue and overuse of muscles. Target date:  10/12/14.   Status On-going   PT SHORT TERM GOAL #3   Title Pt will report no falls in the last 4 weeks to improve safety during ambulation. Target dtae: 10/12/14.   Status On-going           PT Long Term Goals - 09/28/14 1615    PT LONG TERM GOAL #1   Title Pt will improve BERG balance score to >/=42/56 to reduce falls risk. Target date: 11/09/14.   Status On-going   PT LONG TERM GOAL #2   Title Pt will verbalize understanding of fall prevention strategies to decrease falls risk. Target date: 11/09/14.   Status On-going   PT LONG TERM GOAL #3   Title Pt will perform TUG, without LRAD, in </=13.5 seconds to decrease falls risk. Target date: 11/09/14.   Status On-going   PT LONG TERM GOAL #4   Title Pt will ambulate 400', with LRAD,  over even/uneven terrain at MOD I level to improve functional mobility. Target date: 11/09/14.   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 Target date: 11/09/14.   Status On-going   PT LONG TERM GOAL #6   Title Pt will improve FOTO score by 10 points to improve quality of life. Target date: 11/09/14.   Status On-going               Plan - 10/07/14 1207    Clinical Impression Statement Pt demonstrated progress, as she was able to tolerate resistance strengthening HEP. Pt continues to report fatigue and "overuse", and required rest breaks during session. Pt would continue to benefit from skilled PT to improve safety during functional mobility.    Pt will benefit from skilled therapeutic intervention in order to improve on the following deficits Abnormal gait;Decreased endurance;Decreased knowledge of use of DME;Decreased balance;Impaired tone;Impaired UE functional use;Decreased strength;Decreased mobility   Rehab Potential Good   PT Frequency 2x / week   PT Duration 8 weeks   PT Treatment/Interventions ADLs/Self Care Home Management;Gait training;Neuromuscular re-education;Stair training;Biofeedback;Functional mobility training;Patient/family education;Therapeutic activities;Therapeutic exercise;Manual techniques;DME Instruction;Balance training   PT Next Visit Plan Gait training with pt's AFOs (and other AFOs as pt states her AFOs are uncomfortable), stair training.   PT Home Exercise Plan Balance, strengthening, and yoga HEP.   Consulted and Agree with Plan of Care Patient        Problem List Patient Active Problem List   Diagnosis Date Noted  . SOB (shortness of breath) 10/02/2014  . 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 Infection due to trichomonas vaginalis 09/15/2014  . 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 [place], [place], [address] Phone: [phone]   Fax:  [phone]    Geoffry Paradise, PT,DPT 10/07/2014 12:11 PM Phone: [phone] Fax: [phone]

## 2014-10-07 NOTE — Patient Instructions (Addendum)
**  Purchase ankle weights (adjustable weights) at [employer] or online.  FLEXION: Sitting (Active)   Sit, both feet flat. Lift right knee toward ceiling and then left knee. Use _3__ lbs. Complete _2__ sets of __10_ repetitions. Perform _2__ sessions per week.  Copyright  VHI. All rights reserved.  Functional Quadriceps: Sit to Stand   Sit on edge of chair, feet flat on floor. Stand upright, extending knees fully. Repeat _10___ times per set. Do __1__ sets per session. Do _1___ sessions per day.  http://orth.exer.us/735   Copyright  VHI. All rights reserved.  ABDUCTION: Sitting - Resistance Band (Active)   Sit with feet flat. Keep feet together and bring right leg and left leg out to the side, against red resistance band. Complete _2__ sets of _10__ repetitions. Perform _2__ sessions per week.  Copyright  VHI. All rights reserved.  FUNCTIONAL MOBILITY: Wall Squat   Stance: shoulder-width on floor, against wall. Place feet in front of hips. Bend hips and knees and hold for 5 seconds. Keep back straight. Do not allow knees to bend past toes. Squeeze glutes and quads to stand. _5__ reps per set, 2 days per week  Copyright  VHI. All rights reserved.  FLEXION: Sitting (Active)   Sit with right leg extended. Bend knee and draw foot backward. Use _2__ lbs. Repeat with left leg. Complete _2__ sets of _10__ repetitions. Perform __2_ sessions per week.  Copyright  VHI. All rights reserved.

## 2014-10-07 NOTE — Telephone Encounter (Signed)
ROI faxed to [employer] in the city of [place] @ [phone]

## 2014-10-08 ENCOUNTER — Ambulatory Visit (HOSPITAL_COMMUNITY): Payer: Medicare (Managed Care) | Attending: Cardiology

## 2014-10-08 DIAGNOSIS — G7111 Myotonic muscular dystrophy: Secondary | ICD-10-CM | POA: Diagnosis not present

## 2014-10-08 DIAGNOSIS — D869 Sarcoidosis, unspecified: Secondary | ICD-10-CM | POA: Diagnosis not present

## 2014-10-08 NOTE — Progress Notes (Signed)
2D Echo completed. 10/08/2014

## 2014-10-09 ENCOUNTER — Ambulatory Visit: Payer: Medicare (Managed Care) | Admitting: Physical Therapy

## 2014-10-09 ENCOUNTER — Encounter: Payer: Self-pay | Admitting: Physical Therapy

## 2014-10-09 DIAGNOSIS — R269 Unspecified abnormalities of gait and mobility: Secondary | ICD-10-CM | POA: Diagnosis not present

## 2014-10-09 DIAGNOSIS — R531 Weakness: Secondary | ICD-10-CM

## 2014-10-09 NOTE — Patient Instructions (Signed)
It is important to avoid accidents which may result in broken bones.  Here are a few ideas on how to make your home safer so you will be less likely to trip or fall.  1. Use nonskid mats or non slip strips in your shower or tub, on your bathroom floor and around sinks.  If you know that you have spilled water, wipe it up! 2. In the bathroom, it is important to have properly installed grab bars on the walls or on the edge of the tub.  Towel racks are NOT strong enough for you to hold onto or to pull on for support. 3. Stairs and hallways should have enough light.  Add lamps or night lights if you need ore light. 4. It is good to have handrails on both sides of the stairs if possible.  Always fix broken handrails right away. 5. It is important to see the edges of steps.  Paint the edges of outdoor steps white so you can see them better.  Put colored tape on the edge of inside steps. 6. Throw-rugs are dangerous because they can slide.  Removing the rugs is the best idea, but if they must stay, add adhesive carpet tape to prevent slipping. 7. Do not keep things on stairs or in the halls.  Remove small furniture that blocks the halls as it may cause you to trip.  Keep telephone and electrical cords out of the way where you walk. 8. Always were sturdy, rubber-soled shoes for good support.  Never wear just socks, especially on the stairs.  Socks may cause you to slip or fall.  Do not wear full-length housecoats as you can easily trip on the bottom.  9. Place the things you use the most on the shelves that are the easiest to reach.  If you use a stepstool, make sure it is in good condition.  If you feel unsteady, DO NOT climb, ask for help. 10. If a health professional advises you to use a cane or walker, do not be ashamed.  These items can keep you from falling and breaking your bones.  We all get worn out sometimes. But fatigue secondary to disability or aging can really interfere with the ability to  function independently. If you find that fatigue is keeping you from doing things you want to do in your life, energy conservation techniques may help you. Energy conservation means looking at your daily routines to find ways to reduce the amount of effort needed to perform certain tasks, eliminating other tasks, and building more rest throughout the day. Keep in mind that not every technique will work for you. These are suggestions you can use and adapt to find the right fit for you. Remember: Energy is like money - you've only got so much, so think about what you're spending it on! Rearrange Your Environment ! Keep frequently used items in easily accessible places. ! Replace existing heavy items with lighter ones; for example, use plastic plates and cups rather than [place] and glass. ! Install long handles on faucets and doorknobs. ! Adjust work spaces, such as raising a tabletop, to eliminate awkward positions; bad posture drains energy. ! Install pull-out or swing-out shelving in cabinets. ! Wear an apron with pockets to carry around cooking utensils or cleaning tools. ! Consider moving your bed to the first floor to eliminate stair climbing. Eliminate Unnecessary Effort ! Sit rather than stand whenever possible: while preparing meals, washing dishes, ironing, etc. ! Use adaptive  equipment to make tasks easier; try a jar opener, a reacher, a shower chair to allow you to sit while bathing, or a hands-free headset for your phone. ! Soak your dishes before washing, then let them air dry; or use paper plates and napkins. ! Use prepared foods when possible. ! Get a rolling cart to transport things around the house, rather than carry them. ! See if your grocery store will deliver your groceries. ! Use store-provided wheelchairs or scooters when you shop. Plan Ahead ! Gather all the supplies you need for a task or project before starting, so everything is in one place. ! Call ahead to stores to  make sure the items you need are available. ! Cook in larger quantities and refrigerate or freeze extra portions for later. ! Work rest breaks into activities as often as possible. Take a break before you get tired. ! Schedule enough time for activities - rushing takes more energy. ! Try keeping a daily activity journal for a few weeks to identify times of day or certain tasks that result in more fatigue. Prioritize ! Eliminate or reduce tasks that aren't that important to you. ! Delegate tasks to friends or family members who offer help. ! Consider hiring professionals, such as a cleaning or lawn care service, to cut down your workload.

## 2014-10-09 NOTE — Therapy (Signed)
[facility] [address] [place], [place], [address] Phone: [phone]   Fax:  [phone]  Physical Therapy Treatment  Patient Details  Name: Cynthia Underwood MRN: [id number] Date of Birth: [birth year] Referring Provider:  Lance Bosch, NP  Encounter Date: 10/09/2014      PT End of Session - 10/09/14 1106    Visit Number 5   Number of Visits 17   Date for PT Re-Evaluation 11/13/14   Authorization Type G-code every 10th visit.   PT Start Time 1104   PT Stop Time 1145   PT Time Calculation (min) 41 min   Equipment Utilized During Treatment Gait belt   Activity Tolerance Patient tolerated treatment well;Patient limited by fatigue   Behavior During Therapy [facility] for tasks assessed/performed      Past Medical History  Diagnosis Date  . Myoclonic disorder   . Anemia   . Excess ear wax   . Watery eyes     Left  . Headache   . Weakness of both legs   . Depression   . Bifascicular block 10/02/2014  . 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    Past Surgical History  Procedure Laterality Date  . Biopsy breast Right     LMP 08/11/2014 (Exact Date)  Visit Diagnosis:  Abnormality of gait  General weakness      Subjective Assessment - 10/09/14 1105    Symptoms No new complaints. No new falls or pain to report today.   Currently in Pain? No/denies   Pain Score 0-No pain     Treatment: Gait - 115 feet with out AD with supervision. Pt noted to have bil toe out and decreased foot clearance with gait. No knee instability noted. Pt brought her current braces in. Has custom hinged ankle braces. She reports they rub and hurt her at her arch area, causing redness and bruising. For this reason did not put them on for gait. Unable to trial other braces in clinic due to none are small enough to fit in pt's shoe's. Will discuss with primary PT getting orthotist consult to look at  modifying current ones vs new ones.  Exercise: - Scifit x 4 extremities level 1.0 x 8 minutes without rest with rpm >/= 60 for strengthening and activity tolerance.  Self Care: - Educated on and provided information on fall prevention strategies and energy conservation strategies for home. Also discussed for pt to begin to look at community fitness options close to her home for continued fitness after therapy is done. Pt verbalized understanding.         PT Education - 10/09/14 1140    Education provided Yes   Education Details fall prevention strategies;energy conservation strategies   Person(s) Educated Patient   Methods Explanation;Demonstration;Handout   Comprehension Verbalized understanding;Returned demonstration          PT Short Term Goals - 09/28/14 1615    PT SHORT TERM GOAL #1   Title Pt will be independent in HEP to improve functional mobility. Target date: 10/12/14.   Status On-going   PT SHORT TERM GOAL #2   Title Pt will verbalize energy conservation strategies to decrease fatigue and overuse of muscles. Target date: 10/12/14.   Status On-going   PT SHORT TERM GOAL #3   Title Pt will report no falls in the last 4 weeks to improve safety during ambulation. Target dtae: 10/12/14.   Status On-going  PT Long Term Goals - 09/28/14 1615    PT LONG TERM GOAL #1   Title Pt will improve BERG balance score to >/=42/56 to reduce falls risk. Target date: 11/09/14.   Status On-going   PT LONG TERM GOAL #2   Title Pt will verbalize understanding of fall prevention strategies to decrease falls risk. Target date: 11/09/14.   Status On-going   PT LONG TERM GOAL #3   Title Pt will perform TUG, without LRAD, in </=13.5 seconds to decrease falls risk. Target date: 11/09/14.   Status On-going   PT LONG TERM GOAL #4   Title Pt will ambulate 400', with LRAD, over even/uneven terrain at MOD I level to improve functional mobility. Target date: 11/09/14.   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   Status On-going   PT LONG TERM GOAL #6   Title Pt will improve FOTO score by 10 points to improve quality of life. Target date: 11/09/14.   Status On-going           Plan - 10/09/14 1106    Clinical Impression Statement Pt brought in her current ankle foot orthotics (AFO)'s. Unable to try them due to her reports that they cause bruising on her arches and pain. We were unable to trial others as we do not have any small enough for her feet. Will have her primary PT request an orthotic consult with an orthotist to see if her current ones can be modified vs new ones. Progressing toward goals.                                    Pt will benefit from skilled therapeutic intervention in order to improve on the following deficits Abnormal gait;Decreased endurance;Decreased knowledge of use of DME;Decreased balance;Impaired tone;Impaired UE functional use;Decreased strength;Decreased mobility   Rehab Potential Good   PT Frequency 2x / week   PT Duration 8 weeks   PT Treatment/Interventions ADLs/Self Care Home Management;Gait training;Neuromuscular re-education;Stair training;Biofeedback;Functional mobility training;Patient/family education;Therapeutic activities;Therapeutic exercise;Manual techniques;DME Instruction;Balance training   PT Next Visit Plan Continue with strengthening, balance and gait training. Orthotic consult with orthotist.   PT Home Exercise Plan Balance, strengthening, and yoga HEP.   Consulted and Agree with Plan of Care Patient       Problem List Patient Active Problem List   Diagnosis Date Noted  . SOB (shortness of breath) 10/02/2014  . 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 Infection due to trichomonas vaginalis 09/15/2014  . 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

## 2014-10-12 ENCOUNTER — Telehealth: Payer: Self-pay | Admitting: Cardiology

## 2014-10-12 ENCOUNTER — Ambulatory Visit: Payer: Medicare (Managed Care)

## 2014-10-12 DIAGNOSIS — R269 Unspecified abnormalities of gait and mobility: Secondary | ICD-10-CM

## 2014-10-12 DIAGNOSIS — R531 Weakness: Secondary | ICD-10-CM

## 2014-10-12 NOTE — Telephone Encounter (Signed)
Walk in pt form "[employer] Application" Dropped Off gave to [place]

## 2014-10-12 NOTE — Therapy (Signed)
[facility] [address] [place], [place], [address] Phone: [phone]   Fax:  [phone]  Physical Therapy Treatment  Patient Details  Name: Cynthia Underwood MRN: [id number] Date of Birth: 1971-05-13 Referring Provider:  Lance Bosch, NP  Encounter Date: 10/12/2014      PT End of Session - 10/12/14 1233    Visit Number 6   Number of Visits 17   Date for PT Re-Evaluation 11/13/14   Authorization Type G-code every 10th visit.   PT Start Time 1157   PT Stop Time 1227   PT Time Calculation (min) 30 min   Equipment Utilized During Treatment Gait belt   Activity Tolerance Patient tolerated treatment well;Patient limited by fatigue   Behavior During Therapy WFL for tasks assessed/performed      Past Medical History  Diagnosis Date  . Myoclonic disorder   . Anemia   . Excess ear wax   . Watery eyes     Left  . Headache   . Weakness of both legs   . Depression   . Bifascicular block 10/02/2014  . 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    Past Surgical History  Procedure Laterality Date  . Biopsy breast Right     LMP 08/11/2014 (Exact Date)  Visit Diagnosis:  Abnormality of gait  General weakness      Subjective Assessment - 10/12/14 1159    Symptoms Pt arrived 12 minutes late to session. Pt denied falls or changes since last visit.    Pertinent History Myotonic dystrophy    Patient Stated Goals to strengthen legs and arms, stop falling, be more independent   Currently in Pain? No/denies                    [facility] Adult PT Treatment/Exercise - 10/12/14 1207    Standardized Balance Assessment   Standardized Balance Assessment Berg Balance Test   Berg Balance Test   Sit to Stand Able to stand without using hands and stabilize independently   Standing Unsupported Able to stand safely 2 minutes   Sitting with Back Unsupported but Feet Supported on  Floor or Stool Able to sit safely and securely 2 minutes   Stand to Sit Sits safely with minimal use of hands   Transfers Able to transfer safely, minor use of hands   Standing Unsupported with Eyes Closed Able to stand 10 seconds with supervision   Standing Ubsupported with Feet Together Able to place feet together independently and stand 1 minute safely   From Standing, Reach Forward with Outstretched Arm Can reach forward >12 cm safely (5")  6"   From Standing Position, Pick up Object from Floor Able to pick up shoe safely and easily   From Standing Position, Turn to Look Behind Over each Shoulder Looks behind from both sides and weight shifts well   Turn 360 Degrees Able to turn 360 degrees safely but slowly   Standing Unsupported, Alternately Place Feet on Step/Stool Able to stand independently and safely and complete 8 steps in 20 seconds   Standing Unsupported, One Foot in Front Able to plae foot ahead of the other independently and hold 30 seconds   Standing on One Leg Able to lift leg independently and hold 5-10 seconds  5 seconds   Total Score 50                PT Education - 10/12/14 1206  Education provided Yes   Education Details Reviewed HEP. Educated pt on purchasing ankle weight from sporting goods store or online. Educated pt on increasing wall squat hold to 7-10 seconds, as she reported 5 seconds as "being too easy". Pt verbalized energy conservation strategies to decrease fatigue and overuse of muscles. PT explained the significance of BERG score.    Person(s) Educated Patient   Methods Explanation   Comprehension Verbalized understanding          PT Short Term Goals - 10/12/14 1241    PT SHORT TERM GOAL #1   Title Pt will be independent in HEP to improve functional mobility. Target date: 10/12/14.   Status Achieved   PT SHORT TERM GOAL #2   Title Pt will verbalize energy conservation strategies to decrease fatigue and overuse of muscles. Target date:  10/12/14.   Status Achieved   PT SHORT TERM GOAL #3   Title Pt will report no falls in the last 4 weeks to improve safety during ambulation. Target dtae: 10/12/14.   Status Achieved           PT Long Term Goals - 10/12/14 1242    PT LONG TERM GOAL #1   Title Pt will improve BERG balance score to >/=54/56 to reduce falls risk. Target date: 11/09/14.   Baseline Revised on 10/12/14, as pt scored 50/56 on 10/12/14.   Status Revised   PT LONG TERM GOAL #2   Title Pt will verbalize understanding of fall prevention strategies to decrease falls risk. Target date: 11/09/14.   Status On-going   PT LONG TERM GOAL #3   Title Pt will perform TUG, without LRAD, in </=13.5 seconds to decrease falls risk. Target date: 11/09/14.   Status On-going   PT LONG TERM GOAL #4   Title Pt will ambulate 400', with LRAD, over even/uneven terrain at MOD I level to improve functional mobility. Target date: 11/09/14.   Status On-going   PT LONG TERM GOAL #5   Title Pt will improve gait speed to >/=2.4f/sec. to be a cHydrographic surveyor Target date: 11/09/14.   Status On-going   PT LONG TERM GOAL #6   Title Pt will improve FOTO score by 10 points to improve quality of life. Target date: 11/09/14.   Status On-going               Plan - 10/12/14 1234    Clinical Impression Statement Pt demonstrated progress, as she met 3/3 STGs and BERG LTG. Pt scored 50/56 on BERG balance score, indicating pt is a moderate falls risk. Pt demonstrated significant improvements on BERG, as she scored a 34/56 during evaluation. PT reducing duration to 6 weeks vs. 8 weeks due to great progress. Pt continues to experience fatigue, and impaired balance during ambulation and would benefit from skilled PT to improve safety during functional mobility. PT faxed orthotics consult to [facility].   Pt will benefit from skilled therapeutic intervention in order to improve on the following deficits Abnormal gait;Decreased endurance;Decreased knowledge of use  of DME;Decreased balance;Impaired tone;Impaired UE functional use;Decreased strength;Decreased mobility   Rehab Potential Good   PT Frequency 2x / week   PT Duration 8 weeks  changed to 6 weeks, due to great progress   PT Treatment/Interventions ADLs/Self Care Home Management;Gait training;Neuromuscular re-education;Stair training;Biofeedback;Functional mobility training;Patient/family education;Therapeutic activities;Therapeutic exercise;Manual techniques;DME Instruction;Balance training   PT Next Visit Plan Continue to assess LTGs and revise as appropriate. Check to see if pt set up additional appt's.   PT  Home Exercise Plan Balance, strengthening, and yoga HEP.   Consulted and Agree with Plan of Care Patient        Problem List Patient Active Problem List   Diagnosis Date Noted  . SOB (shortness of breath) 10/02/2014  . 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 Infection due to trichomonas vaginalis 09/15/2014  . Myotonic dystrophy, type 1 09/14/2014    Tareva Leske L 10/12/2014, 12:45 PM  [facility] [address] [place], [place], [address] Phone: [phone]   Fax:  [phone]     Geoffry Paradise, PT,DPT 10/12/2014 12:45 PM Phone: [phone] Fax: [phone]

## 2014-10-13 ENCOUNTER — Ambulatory Visit
Admission: RE | Admit: 2014-10-13 | Discharge: 2014-10-13 | Disposition: A | Discharge status: Home / Self Care | Payer: Medicaid Other | Source: Ambulatory Visit | Attending: Internal Medicine | Admitting: Internal Medicine

## 2014-10-13 DIAGNOSIS — Z1231 Encounter for screening mammogram for malignant neoplasm of breast: Secondary | ICD-10-CM

## 2014-10-14 ENCOUNTER — Ambulatory Visit: Payer: Medicare (Managed Care)

## 2014-10-14 DIAGNOSIS — R531 Weakness: Secondary | ICD-10-CM

## 2014-10-14 DIAGNOSIS — R269 Unspecified abnormalities of gait and mobility: Secondary | ICD-10-CM

## 2014-10-14 NOTE — Patient Instructions (Signed)
Abduction: Clam - Side-Lying   Lie on side (on a couch or with pillows behind you) with knees bent and make sure hips stay forward and don't roll backwardst. Lift top knee, keeping feet together. Keep trunk steady. Slowly lower knee. _5__ reps per set, _2__ sets per day, _3__ days per week. Add theraband around knees when you achieve 15___ repetitions.  Copyright  VHI. All rights reserved.

## 2014-10-14 NOTE — Therapy (Signed)
[facility] [address] [place], [place], [address] Phone: [phone]   Fax:  [phone]  Physical Therapy Treatment  Patient Details  Name: Cynthia Underwood MRN: [id number] Date of Birth: [birth year] Referring Provider:  Lance Bosch, NP  Encounter Date: 10/14/2014      PT End of Session - 10/14/14 1215    Visit Number 7   Number of Visits 17   Date for PT Re-Evaluation 11/13/14   Authorization Type G-code every 10th visit.   PT Start Time 1115   PT Stop Time 1155   PT Time Calculation (min) 40 min   Activity Tolerance Patient tolerated treatment well;Patient limited by fatigue   Behavior During Therapy [facility] for tasks assessed/performed      Past Medical History  Diagnosis Date  . Myoclonic disorder   . Anemia   . Excess ear wax   . Watery eyes     Left  . Headache   . Weakness of both legs   . Depression   . Bifascicular block 10/02/2014  . 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    Past Surgical History  Procedure Laterality Date  . Biopsy breast Right     LMP 08/11/2014 (Exact Date)  Visit Diagnosis:  Abnormality of gait  General weakness      Subjective Assessment - 10/14/14 1117    Symptoms Pt denied falls or changes since last visit.    Pertinent History Myotonic dystrophy    Patient Stated Goals to strengthen legs and arms, stop falling, be more independent   Currently in Pain? No/denies          [facility] PT Assessment - 10/14/14 1118    Functional Gait  Assessment   Gait assessed  Yes   Gait Level Surface Walks 20 ft, slow speed, abnormal gait pattern, evidence for imbalance or deviates 10-15 in outside of the 12 in walkway width. Requires more than 7 sec to ambulate 20 ft.  7.46   Change in Gait Speed Able to change speed, demonstrates mild gait deviations, deviates 6-10 in outside of the 12 in walkway width, or no gait deviations, unable  to achieve a major change in velocity, or uses a change in velocity, or uses an assistive device.   Gait with Horizontal Head Turns Performs head turns smoothly with no change in gait. Deviates no more than 6 in outside 12 in walkway width   Gait with Vertical Head Turns Performs head turns with no change in gait. Deviates no more than 6 in outside 12 in walkway width.   Gait and Pivot Turn Pivot turns safely within 3 sec and stops quickly with no loss of balance.   Step Over Obstacle Is able to step over one shoe box (4.5 in total height) without changing gait speed. No evidence of imbalance.   Gait with Narrow Base of Support Ambulates 4-7 steps.  6 steps   Gait with Eyes Closed Walks 20 ft, slow speed, abnormal gait pattern, evidence for imbalance, deviates 10-15 in outside 12 in walkway width. Requires more than 9 sec to ambulate 20 ft.   Ambulating Backwards Walks 20 ft, uses assistive device, slower speed, mild gait deviations, deviates 6-10 in outside 12 in walkway width.   Steps Alternating feet, must use rail.   Total Score 20                  [facility] Adult PT Treatment/Exercise - 10/14/14  1118    Ambulation/Gait   Ambulation/Gait Yes   Ambulation/Gait Assistance 5: Supervision   Ambulation/Gait Assistance Details Pt ambulated over even/uneven terrain without LOB. However, pt noted to ambulate with narrow BOS while ambulating around turns.   Ambulation Distance (Feet) 460 Feet   Assistive device None   Gait Pattern Step-through pattern;Narrow base of support  occasional narrow BOS   Ambulation Surface Level;Unlevel;Indoor   Gait velocity 3.2f/sec.   Stairs Yes   Stairs Assistance 5: Supervision   Stairs Assistance Details (indicate cue type and reason) Supervision for safety, cues to improve weight shifting and to decrease B hip adduction while descending stairs.   Stair Management Technique One rail Right;Alternating pattern   Number of Stairs 8   Height of Stairs 6    Ramp 5: Supervision   Ramp Details (indicate cue type and reason) x2. Supervision to ensure safety.   Curb 5: Supervision   Curb Details (indicate cue type and reason) x2. Supervision to ensure safety.   Standardized Balance Assessment   Standardized Balance Assessment Timed Up and Go Test   Timed Up and Go Test   TUG Normal TUG   Normal TUG (seconds) 10.94  no AD   Exercises   Exercises Knee/Hip   Knee/Hip Exercises: Standing   Other Standing Knee Exercises B LE: 4" step downs to improve eccentric control, x10/LE.   Knee/Hip Exercises: Sidelying   Clams B LEs: 2x5reps. VC's for technique and to keep hips forward.                PT Education - 10/14/14 1144    Education provided Yes   Education Details AFO education: PT educated pt on trying to wear insoles between AFO and socks to increase comfort and to purchase wide shoes to accomodate AFO. PT explained the importance of strengthening B hip abductors to decrease B hip adduction while descending stairs, as pt has knee pain when traversing stairs.   Person(s) Educated Patient   Methods Explanation   Comprehension Verbalized understanding          PT Short Term Goals - 10/12/14 1241    PT SHORT TERM GOAL #1   Title Pt will be independent in HEP to improve functional mobility. Target date: 10/12/14.   Status Achieved   PT SHORT TERM GOAL #2   Title Pt will verbalize energy conservation strategies to decrease fatigue and overuse of muscles. Target date: 10/12/14.   Status Achieved   PT SHORT TERM GOAL #3   Title Pt will report no falls in the last 4 weeks to improve safety during ambulation. Target dtae: 10/12/14.   Status Achieved           PT Long Term Goals - 10/14/14 1218    PT LONG TERM GOAL #1   Title Pt will improve BERG balance score to >/=54/56 to reduce falls risk. Target date: 10/30/14.   Baseline Revised on 10/12/14, as pt scored 50/56 on 10/12/14.   Status On-going   PT LONG TERM GOAL #2   Title Pt will  verbalize understanding of fall prevention strategies to decrease falls risk. Target date: 10/30/14.   Status On-going   PT LONG TERM GOAL #3   Title Pt will perform TUG, without LRAD, in </=13.5 seconds to decrease falls risk. Target date: 11/09/14.   Status Achieved   PT LONG TERM GOAL #4   Title Pt will ambulate 400', with LRAD, over even/uneven terrain at MOD I level to improve functional mobility.  Target date: 10/30/14.   Status On-going   PT LONG TERM GOAL #5   Title Pt will improve gait speed to >/=2.67f/sec. to be a cHydrographic surveyor Target date: 11/09/14.   Status Achieved   Additional Long Term Goals   Additional Long Term Goals Yes   PT LONG TERM GOAL #6   Title Pt will improve FOTO score by 10 points to improve quality of life. Target date: 10/30/14.   Status On-going   PT LONG TERM GOAL #7   Title Pt will improve FGA score to >/=25 to decrease falls risk. Target date: 10/30/14.               Plan - 10/14/14 1216    Clinical Impression Statement Pt scored a 20/30 on FGA indicating she is a moderate risk for falls, she had the most difficulty with ambulating with eyes closed and tandem stance. Pt met TUG and gait speed LTGs, demonstrating progress. Pt did not meet ambulation goal due to pt required supervision to ensure safety due to narrow BOS. Pt would continue to benefit from skilled PT to improve safety during functional mobiity.    Pt will benefit from skilled therapeutic intervention in order to improve on the following deficits Abnormal gait;Decreased endurance;Decreased knowledge of use of DME;Decreased balance;Impaired tone;Impaired UE functional use;Decreased strength;Decreased mobility   Rehab Potential Good   PT Frequency 2x / week   PT Duration 8 weeks  changed to 6 weeks due to pt's progress   PT Treatment/Interventions ADLs/Self Care Home Management;Gait training;Neuromuscular re-education;Stair training;Biofeedback;Functional mobility training;Patient/family  education;Therapeutic activities;Therapeutic exercise;Manual techniques;DME Instruction;Balance training   PT Next Visit Plan Dynamic gait (forward, back, tandem, with eyes open/closed), and dynamic balance activites   PT Home Exercise Plan Balance, strengthening, and yoga HEP.   Consulted and Agree with Plan of Care Patient        Problem List Patient Active Problem List   Diagnosis Date Noted  . SOB (shortness of breath) 10/02/2014  . 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 Infection due to trichomonas vaginalis 09/15/2014  . Myotonic dystrophy, type 1 09/14/2014    Athel Merriweather L 10/14/2014, 12:22 PM  [facility] [place] [address]Phone: [phone]  Fax:  [phone]    JGeoffry Paradise PT,DPT 10/14/2014 12:23 PM Phone: [phone]: [phone]

## 2014-10-15 NOTE — Telephone Encounter (Signed)
Informed patient that Dr. Radford Pax will not complete SCAT eligibility form, that she has no cardiac reason to need it. Instructed patient to take paperwork to PCP. Patient agrees with treatment plan. Paperwork placed in mail per patient request.

## 2014-10-15 NOTE — Telephone Encounter (Signed)
This encounter was created in error - please disregard.

## 2014-10-16 ENCOUNTER — Ambulatory Visit (INDEPENDENT_AMBULATORY_CARE_PROVIDER_SITE_OTHER): Payer: Medicare (Managed Care) | Admitting: Neurology

## 2014-10-16 ENCOUNTER — Encounter: Payer: Self-pay | Admitting: Neurology

## 2014-10-16 VITALS — BP 100/60 | HR 73 | Ht 62.75 in | Wt 109.4 lb

## 2014-10-16 DIAGNOSIS — E46 Unspecified protein-calorie malnutrition: Secondary | ICD-10-CM

## 2014-10-16 DIAGNOSIS — F17200 Nicotine dependence, unspecified, uncomplicated: Secondary | ICD-10-CM

## 2014-10-16 DIAGNOSIS — G7111 Myotonic muscular dystrophy: Secondary | ICD-10-CM

## 2014-10-16 DIAGNOSIS — R5383 Other fatigue: Secondary | ICD-10-CM | POA: Diagnosis not present

## 2014-10-16 DIAGNOSIS — R5381 Other malaise: Secondary | ICD-10-CM | POA: Diagnosis not present

## 2014-10-16 DIAGNOSIS — Z720 Tobacco use: Secondary | ICD-10-CM | POA: Diagnosis not present

## 2014-10-16 LAB — VITAMIN B12: VITAMIN B 12: 586 pg/mL (ref 211–911)

## 2014-10-16 LAB — FOLATE: Folate: 5.4 ng/mL

## 2014-10-16 NOTE — Progress Notes (Signed)
Cynthia Underwood - Initial Visit   Date: 10/16/2014   Cynthia Underwood MRN: [id number] DOB: 09/01/1971   Dear Dr. Feliciana Rossetti:  Thank you for your kind referral of Cynthia Underwood for consultation of myotonic dystrophy type I. Although her history is well known to you, please allow [place] to reiterate it for the purpose of our medical record. The patient was accompanied to the clinic by self.    History of Present Illness: Cynthia Underwood is a 44 y.o. right-handed African American female with myotonic dystrophy type I and hyperlipidemia presenting to establish care for myotonic dystrophy type I.  Starting around the age of 69, she started developed lock jaw and stiffness of the hands but thought that it was normal so did not make much of it.  Over the years, she developed worsening stiffness of her fingers and hands.  Because her maternal aunt, who is also a patient of mine, had known myotonic dystrophy, she ultimately was genetically tested at the age of 14 which confirmed the diagnosis of myotonic dystrophy type 1. She has a strong family history of DM1 including maternal aunt, cousins x 2, and younger sister.  She does not have any children and has no future plans for pregnancy.  Symptoms were relatively stable until her mid-30s and then started developing fatigue, weakness, daytime sleepiness, and shortness of breath with exertion.  She is seeing Dr. Radford Pax in cardiology for surveillance and her recent echocardiogram was within normal limits. Cardiac MRI is pending.  She reports to falling three times since August 2015.  She walks independently but was told previously told to use leg braces, but does not use it due to discomfort.  Over the past few years, she noticed intermittent difficulty swallowing liquids. She underwent barium swallow last year which reportedly showed signs of aspiration and she was recommended to use a straw and use chin tuck position.   She does not have a good appetite and endorses weight loss.  She drink 1 bottle of ensure daily, if that.   Her last eye evaluation was in 2015 which showed astigmatism.  No evidence of cataracts.  Recent blood work does not show evidence of diabetes.  She was seeing several neurologists over the years at [facility] in [place].  She is not working and has been on disability since 2010.  Out-side paper records, electronic medical record, and images have been reviewed where available and summarized as:  Lab Results  Component Value Date   HGBA1C 5.6 08/26/2014   Lab Results  Component Value Date   TSH 0.472 08/26/2014   Echo 10/08/2014:  Normal study, EF 65-70%.   Past Medical History  Diagnosis Date  . Myotonic dystrophy, type 1     Genetically confirmed  . Anemia   . Excess ear wax   . Watery eyes     Left  . Headache   . Weakness of both legs   . Depression   . Bifascicular block 10/02/2014  . 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    Past Surgical History  Procedure Laterality Date  . Biopsy breast Right      Medications:  Current Outpatient Prescriptions on File Prior to Visit  Medication Sig Dispense Refill  . atorvastatin (LIPITOR) 10 MG tablet Take 1 tablet (10 mg total) by mouth daily. 90 tablet 3  . naproxen (NAPROSYN) 375 MG tablet Take 375 mg by mouth. 1 tablet as needed  for menstrual cramping.     No current facility-administered medications on file prior to visit.    Allergies:  Allergies  Allergen Reactions  . Penicillins Nausea And Vomiting, Rash and Other (See Comments)    fever    Family History: Family History  Problem Relation Age of Onset  . Cancer Father 72    Deceased  . Heart disease Father   . Diabetes Maternal Uncle   . Heart disease Maternal Uncle   . Heart disease Paternal Grandmother   . Diabetes Paternal Grandmother   . Hypertension Paternal Grandmother   . COPD Mother   .  Neuromuscular disorder Maternal Aunt     Myotonic dystrophy  . Neuromuscular disorder Cousin     Myotonic dystrophy  . Neuromuscular disorder Sister     Myotonic dystrophy    Social History: History   Social History  . Marital Status: Single    Spouse Name: N/A  . Number of Children: 0  . Years of Education: 1.5 Collge   Occupational History  . Unemployed Other   Social History Main Topics  . Smoking status: Current Every Day Smoker -- 0.25 packs/day for 26 years    Types: Cigarettes  . Smokeless tobacco: Never Used  . Alcohol Use: 0.0 oz/week    0 Standard drinks or equivalent per week     Comment: Rarely only holidays  . Drug Use: Yes     Comment: Marijuana  . Sexual Activity: Not Currently    Birth Control/ Protection: None   Other Topics Concern  . Not on file   Social History Narrative   Patient lives at home with her aunt.   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  She moved to [facility] in August 2015.   She has been on disability since 2010.   Education: 1 1/2 years of college.       Review of Systems:  CONSTITUTIONAL: No fevers, chills, night sweats, +weight loss.   EYES: No visual changes or eye pain ENT: No hearing changes.  No history of nose bleeds.   RESPIRATORY: No cough, wheezing +shortness of breath.   CARDIOVASCULAR: Negative for chest pain, and palpitations.   GI: Negative for abdominal discomfort, blood in stools or black stools.  No recent change in bowel habits.   GU:  No history of incontinence.   MUSCLOSKELETAL: +history of joint pain or swelling.  +myalgias.   SKIN: Negative for lesions, rash, and itching.   HEMATOLOGY/ONCOLOGY: Negative for prolonged bleeding, bruising easily, and swollen nodes.  No history of cancer.   ENDOCRINE: Negative for cold or heat intolerance, polydipsia or goiter.   PSYCH:  No depression or anxiety symptoms.   NEURO: As Above.   Vital Signs:  BP 100/60 mmHg  Pulse 73  Ht 5' 2.75" (1.594  m)  Wt 109 lb 6 oz (49.612 kg)  BMI 19.53 kg/m2  SpO2 99%  LMP 08/11/2014 (Exact Date)   General Medical Exam:   General:  Thin-appearing, comfortable.   Eyes/ENT: see cranial nerve examination.  Typical "haggard" facies, with open mouth, temporal wasting Neck: No masses appreciated.  Full range of motion without tenderness.  No carotid bruits. Respiratory:  Clear to auscultation, good air entry bilaterally.   Cardiac:  Regular rate and rhythm, no murmur.   Extremities:  No deformities, edema, or skin discoloration.  Skin:  No rashes or lesions.  Neurological Exam: MENTAL STATUS including orientation to time, place, person, recent and remote memory, attention  span and concentration, language, and fund of knowledge is normal.  Speech is mildly hoarse and hypophonic.  CRANIAL NERVES: II:  No visual field defects.  Unremarkable fundi.   III-IV-VI: Pupils equal round and reactive to light.  Normal conjugate, extra-ocular eye movements in all directions of gaze.  No nystagmus.  Mild bilateral ptosis.  No eyelid myotonia. V:  Normal facial sensation.   VII:  Transverse smile, inverted V appears of mouth, mild facial weakness as noted frontalis and oribicularis oculi 5-/5, unable to keep air in cheeks, oribicularis oris 4/5.  No pathologic facial reflexes.  VIII:  Normal hearing and vestibular function.   IX-X:  Normal palatal movement.   XI:  Normal shoulder shrug and head rotation.   XII:  Normal tongue strength and range of motion, no deviation or fasciculation.  MOTOR:  Generalized loss of muscle bulk throughout.  Mild grip myotonia bilaterally.  No percussion myotonia.  No pronator drift.  Tone is normal.   Neck flexion 5/5, neck extension 5-/5 Right Upper Extremity:    Left Upper Extremity:    Deltoid  4/5   Deltoid  4/5   Biceps  4/5   Biceps  4/5   Triceps  4/5   Triceps  4/5   Wrist extensors  4/5   Wrist extensors  4/5   Wrist flexors  4/5   Wrist flexors  4/5   Finger  extensors  4/5   Finger extensors  4/5   Finger flexors  4/5   Finger flexors  4/5   Dorsal interossei  4/5   Dorsal interossei  4/5   Abductor pollicis  4/5   Abductor pollicis  4/5   Tone (Ashworth scale)  0  Tone (Ashworth scale)  0   Right Lower Extremity:    Left Lower Extremity:    Hip flexors  4/5   Hip flexors  4/5   Hip extensors  4/5   Hip extensors  4/5   Knee flexors  4/5   Knee flexors  4/5   Knee extensors  4/5   Knee extensors  4/5   Dorsiflexors  4/5   Dorsiflexors  4/5   Plantarflexors  4/5   Plantarflexors  4/5   Toe extensors  4/5   Toe extensors  4/5   Toe flexors  4/5   Toe flexors  4/5   Tone (Ashworth scale)  0  Tone (Ashworth scale)  0   MSRs:  Right                                                                 Left brachioradialis 2+  brachioradialis 2+  biceps 2+  biceps 2+  triceps 2+  triceps 2+  patellar 2+  patellar 2+  ankle jerk 1+  ankle jerk 1+  Hoffman no  Hoffman no  plantar response down  plantar response down   SENSORY:  Normal and symmetric perception of light touch, pinprick, vibration, and proprioception.  Romberg's sign absent.   COORDINATION/GAIT: Normal finger-to- nose-finger and heel-to-shin.  Intact rapid alternating movements bilaterally.  Unable to rise from a chair without using arms.  Gait narrow based and stable.  Mild difficulty with stressed and tandem gait, but she is able to perform.  IMPRESSION: Ms. Kuna is a 44 year-old female with genetically confirmed myotonic dystrophy type 1 presenting to establish care.  Her exam discloses a generalized pattern of muscle weakness, for which she is undergoing physical therapy. Her primary complaint today is fatigue and sleepiness, which may be due to untreated OSA.  She is scheduled to have sleep study later this month.  She does not seem to have any other complications of myotonic dystrophy such as cardiac disease, cataracts, and diabetes at this time, but I stressed the importance  of annual surveillance.   PLAN/RECOMMENDATIONS:  1.  Agree with sleep evaluation as this may be contributing to fatigue.  If sleep evaluation is normal, consider provigil going forward 2.  Recommend annual eye exam 3.  If swallowing worsens, consider repeat barium swallow 4.  Check vitamin B12 and folate 5.  Encouraged to add 2-3 cans of ensure daily 6.  Encouraged to stop smoking 8.  Continue physical therapy, recommended rollator for long distances 9.  Return to clinic 4-6 months   The duration of this appointment visit was 45 minutes of face-to-face time with the patient.  Greater than 50% of this time was spent in counseling, explanation of diagnosis, planning of further management, and coordination of care.   Thank you for allowing me to participate in patient's care.  If I can answer any additional questions, I would be pleased to do so.    Sincerely,    Simrin Vegh K. Posey Pronto, DO

## 2014-10-16 NOTE — Patient Instructions (Addendum)
1.  Agree with sleep evaluation as this may be contributing to fatigue 2.  Recommend annual eye exam 3.  If swallowing worsens, consider repeat barium swallow 4.  Check vitamin B12 and folate 5.  Consider provigil, if sleep evaluation is normal. 6.  Encouraged to add 2-3 cans of ensure daily 7.  Encouraged to stop smoking 8.  Continue physical therapy, use rollator for long distances 9.  Return to clinic 4-6 months

## 2014-10-20 ENCOUNTER — Telehealth: Payer: Self-pay | Admitting: *Deleted

## 2014-10-20 ENCOUNTER — Other Ambulatory Visit: Payer: Self-pay | Admitting: Neurology

## 2014-10-20 ENCOUNTER — Ambulatory Visit: Payer: Medicare (Managed Care)

## 2014-10-20 DIAGNOSIS — R531 Weakness: Secondary | ICD-10-CM

## 2014-10-20 DIAGNOSIS — R269 Unspecified abnormalities of gait and mobility: Secondary | ICD-10-CM | POA: Diagnosis not present

## 2014-10-20 MED ORDER — UNABLE TO FIND: Status: DC

## 2014-10-20 NOTE — Telephone Encounter (Signed)
Pt aware of results 

## 2014-10-20 NOTE — Telephone Encounter (Signed)
-----   Message from Lance Bosch, NP sent at 10/16/2014 11:00 PM EST ----- Mammogram negative, no cancerous findings

## 2014-10-20 NOTE — Therapy (Signed)
[facility] [address] [place], [place], [address] Phone: [phone]   Fax:  [phone]  Physical Therapy Treatment  Patient Details  Name: Cynthia Underwood MRN: [id number] Date of Birth: 1970/09/16 Referring Provider:  Lance Bosch, NP  Encounter Date: 10/20/2014      PT End of Session - 10/20/14 1524    Visit Number 8   Number of Visits 17   Date for PT Re-Evaluation 11/13/14   Authorization Type G-code every 10th visit.   PT Start Time 1315   PT Stop Time 1358   PT Time Calculation (min) 43 min   Equipment Utilized During Treatment Gait belt   Activity Tolerance Patient tolerated treatment well;Patient limited by fatigue   Behavior During Therapy WFL for tasks assessed/performed      Past Medical History  Diagnosis Date  . Myotonic dystrophy, type 1     Genetically confirmed  . Anemia   . Excess ear wax   . Watery eyes     Left  . Headache   . Weakness of both legs   . Depression   . Bifascicular block 10/02/2014  . 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    Past Surgical History  Procedure Laterality Date  . Biopsy breast Right     LMP 08/11/2014 (Exact Date)  Visit Diagnosis:  Abnormality of gait  General weakness      Subjective Assessment - 10/20/14 1317    Symptoms Pt denied falls or changes since last visit.   Pertinent History Myotonic dystrophy    Patient Stated Goals to strengthen legs and arms, stop falling, be more independent   Currently in Pain? No/denies                    [facility] Adult PT Treatment/Exercise - 10/20/14 1323    Ambulation/Gait   Ambulation/Gait Yes   Ambulation/Gait Assistance 5: Supervision   Ambulation/Gait Assistance Details Gerald Stabs Insurance account manager) present during session. Pt ambulated with and without L [place] reaction (anterior) AFO donned during session, as orthotist and PT did not have a R  AFO small enough for pt to trial.  VC's to improve stride length and heel strike. Pt reported [place] AFO much more comforable than current plastic AFOs.   Ambulation Distance (Feet) --  25' with AFO, and 75'x2 without AFOs.   Assistive device None   Gait Pattern Step-through pattern;Narrow base of support   Ambulation Surface Level;Indoor   Ramp 5: Supervision   Ramp Details (indicate cue type and reason) x1. Supervision to ensure safety.   Curb 5: Supervision   Curb Details (indicate cue type and reason) Supervision to ensure safety.   Balance   Balance Assessed Yes   Dynamic Standing Balance   Dynamic Standing - Balance Support Right upper extremity supported;No upper extremity supported   Dynamic Standing - Level of Assistance 5: Stand by assistance;Other (comment)  min guard   Dynamic Standing - Balance Activities Eyes open;Other (comment)   Dynamic Standing - Comments With 0-1 UE support at counter, pt performed braiding ambulation, tandem ambulation and backwards ambulation (4x7').VC's and demonstration for technique and to improve weight shifting.                PT Education - 10/20/14 1523    Education provided Yes   Education Details PT and orthotist re-educated pt on proper AFO wear and benefits of AFO. Dynamic balance HEP.   Person(s) Educated  Patient   Methods Explanation;Demonstration;Verbal cues;Handout   Comprehension Verbalized understanding;Returned demonstration          PT Short Term Goals - 10/12/14 1241    PT SHORT TERM GOAL #1   Title Pt will be independent in HEP to improve functional mobility. Target date: 10/12/14.   Status Achieved   PT SHORT TERM GOAL #2   Title Pt will verbalize energy conservation strategies to decrease fatigue and overuse of muscles. Target date: 10/12/14.   Status Achieved   PT SHORT TERM GOAL #3   Title Pt will report no falls in the last 4 weeks to improve safety during ambulation. Target dtae: 10/12/14.   Status Achieved            PT Long Term Goals - 10/20/14 1526    PT LONG TERM GOAL #1   Title Pt will improve BERG balance score to >/=54/56 to reduce falls risk. Target date: 10/30/14.   Baseline Revised on 10/12/14, as pt scored 50/56 on 10/12/14.   Status On-going   PT LONG TERM GOAL #2   Title Pt will verbalize understanding of fall prevention strategies to decrease falls risk. Target date: 10/30/14.   Status On-going   PT LONG TERM GOAL #3   Title Pt will perform TUG, without LRAD, in </=13.5 seconds to decrease falls risk. Target date: 11/09/14.   Status Achieved   PT LONG TERM GOAL #4   Title Pt will ambulate 400', with LRAD, over even/uneven terrain at MOD I level to improve functional mobility. Target date: 10/30/14.   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 to be a Hydrographic surveyor. Target date: 11/09/14.   Status Achieved   PT LONG TERM GOAL #6   Title Pt will improve FOTO score by 10 points to improve quality of life. Target date: 10/30/14.   Status On-going   PT LONG TERM GOAL #7   Title Pt will improve FGA score to >/=25 to decrease falls risk. Target date: 10/30/14.   Status New               Plan - 10/20/14 1524    Clinical Impression Statement Pt demonstrated progress, as she was able to progress to dynamic balance HEP without UE support, no LOB noted. Pt would benefit from B toe off (2.0) AFOs to improve foot clearance during ambulation and to decrease redness/brusing caused by pt's current B plastic AFOs. PT sent Dr. Posey Pronto request for AFO prescription. Continue with POC.   Pt will benefit from skilled therapeutic intervention in order to improve on the following deficits Abnormal gait;Decreased endurance;Decreased knowledge of use of DME;Decreased balance;Impaired tone;Impaired UE functional use;Decreased strength;Decreased mobility   Rehab Potential Good   PT Frequency 2x / week   PT Duration 8 weeks   PT  Treatment/Interventions ADLs/Self Care Home Management;Gait training;Neuromuscular re-education;Stair training;Biofeedback;Functional mobility training;Patient/family education;Therapeutic activities;Therapeutic exercise;Manual techniques;DME Instruction;Balance training   PT Next Visit Plan Dynamic gait (forward, back, tandem, with eyes open/closed), and dynamic balance activites   PT Home Exercise Plan Balance, strengthening, and yoga HEP.   Consulted and Agree with Plan of Care Patient        Problem List Patient Active Problem List   Diagnosis Date Noted  . SOB (shortness of breath) 10/02/2014  . 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 Infection due to trichomonas vaginalis 09/15/2014  . Myotonic dystrophy, type 1 09/14/2014  Baby Stairs L 10/20/2014, 3:27 PM  [facility] [address] [place], [place], [address] Phone: [phone]   Fax:  [phone]     Geoffry Paradise, PT,DPT 10/20/2014 3:27 PM Phone: [phone] Fax: [phone]

## 2014-10-20 NOTE — Patient Instructions (Addendum)
Hold onto counter with one hand as needed.  Braiding   Move to side: 1) cross right leg in front of left, 2) bring back leg out to side, then 3) cross right leg behind left, 4) bring left leg out to side for about 10 feet. Continue sequence in same direction. Reverse sequence, moving in opposite direction. Repeat sequence _4___ times per session. Do ___1_ sessions per day.  Copyright  VHI. All rights reserved.  Feet Heel-Toe "Tandem"   Arms at your side, walk a straight line bringing one foot directly in front of the other for about 10 feet, repeat 4 times.  Do __1__ sessions per day.  Copyright  VHI. All rights reserved.  Backward   Walk backwards with eyes open. Take even steps, making sure each foot lifts off floor for about 10 feet, repeat 4 times. Do __1__ sessions per day.   Copyright  VHI. All rights reserved.

## 2014-10-20 NOTE — Progress Notes (Signed)
Rx faxed

## 2014-10-21 ENCOUNTER — Telehealth: Payer: Self-pay

## 2014-10-21 NOTE — Telephone Encounter (Signed)
PT faxed B AFO prescription to [facility] today, along with pt's demographic sheet. Pt has [facility] Gerald Stabs) contact information.

## 2014-10-22 ENCOUNTER — Ambulatory Visit (HOSPITAL_COMMUNITY)
Admission: RE | Admit: 2014-10-22 | Discharge: 2014-10-22 | Disposition: A | Discharge status: Home / Self Care | Payer: Medicare (Managed Care) | Source: Ambulatory Visit | Attending: Cardiology | Admitting: Cardiology

## 2014-10-22 DIAGNOSIS — D869 Sarcoidosis, unspecified: Secondary | ICD-10-CM

## 2014-10-22 DIAGNOSIS — G7111 Myotonic muscular dystrophy: Secondary | ICD-10-CM | POA: Diagnosis not present

## 2014-10-22 LAB — CREATININE, SERUM
Creatinine, Ser: 0.7 mg/dL (ref 0.50–1.10)
GFR calc non Af Amer: >90 mL/min (ref >90)

## 2014-10-22 MED ORDER — GADOBENATE DIMEGLUMINE 529 MG/ML IV SOLN: 17.0000 mL | Freq: Once | INTRAVENOUS | Status: AC | PRN

## 2014-10-23 ENCOUNTER — Ambulatory Visit: Payer: Medicare (Managed Care) | Admitting: Physical Therapy

## 2014-10-28 ENCOUNTER — Ambulatory Visit: Payer: Medicare (Managed Care)

## 2014-10-28 DIAGNOSIS — R269 Unspecified abnormalities of gait and mobility: Secondary | ICD-10-CM | POA: Diagnosis not present

## 2014-10-28 DIAGNOSIS — R531 Weakness: Secondary | ICD-10-CM

## 2014-10-28 NOTE — Therapy (Signed)
[facility] [address] [place], [place], [address] Phone: [phone]   Fax:  [phone]  Physical Therapy Treatment  Patient Details  Name: Cynthia Underwood MRN: [id number] Date of Birth: March 19, 1971 Referring Provider:  Lance Bosch, NP  Encounter Date: 10/28/2014      PT End of Session - 10/28/14 1310    Visit Number 9   Number of Visits 17   Date for PT Re-Evaluation 11/13/14   Authorization Type G-code every 10th visit.   PT Start Time 1150   PT Stop Time 1229   PT Time Calculation (min) 39 min   Equipment Utilized During Treatment Gait belt   Activity Tolerance Patient tolerated treatment well   Behavior During Therapy WFL for tasks assessed/performed      Past Medical History  Diagnosis Date  . Myotonic dystrophy, type 1     Genetically confirmed  . Anemia   . Excess ear wax   . Watery eyes     Left  . Headache   . Weakness of both legs   . Depression   . Bifascicular block 10/02/2014  . 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    Past Surgical History  Procedure Laterality Date  . Biopsy breast Right     LMP 08/11/2014 (Exact Date)  Visit Diagnosis:  Abnormality of gait  General weakness      Subjective Assessment - 10/28/14 1154    Symptoms Pt denied falls or changes since last visit.   Pertinent History Myotonic dystrophy    Patient Stated Goals to strengthen legs and arms, stop falling, be more independent   Currently in Pain? No/denies          [facility] PT Assessment - 10/28/14 0001    Standardized Balance Assessment   Standardized Balance Assessment --   Functional Gait  Assessment   Gait assessed  Yes   Gait Level Surface Walks 20 ft in less than 5.5 sec, no assistive devices, good speed, no evidence for imbalance, normal gait pattern, deviates no more than 6 in outside of the 12 in walkway width.  5.07sec.   Change in Gait Speed  Able to smoothly change walking speed without loss of balance or gait deviation. Deviate no more than 6 in outside of the 12 in walkway width.   Gait with Horizontal Head Turns Performs head turns smoothly with no change in gait. Deviates no more than 6 in outside 12 in walkway width   Gait with Vertical Head Turns Performs head turns with no change in gait. Deviates no more than 6 in outside 12 in walkway width.   Gait and Pivot Turn Pivot turns safely within 3 sec and stops quickly with no loss of balance.   Step Over Obstacle Is able to step over one shoe box (4.5 in total height) without changing gait speed. No evidence of imbalance.   Gait with Narrow Base of Support Ambulates 4-7 steps.   Gait with Eyes Closed Walks 20 ft, slow speed, abnormal gait pattern, evidence for imbalance, deviates 10-15 in outside 12 in walkway width. Requires more than 9 sec to ambulate 20 ft.   Ambulating Backwards Walks 20 ft, uses assistive device, slower speed, mild gait deviations, deviates 6-10 in outside 12 in walkway width.   Steps Alternating feet, no rail.   Total Score 24                  [facility]  Adult PT Treatment/Exercise - 10/28/14 1211    Ambulation/Gait   Ambulation/Gait Yes   Ambulation/Gait Assistance 6: Modified independent (Device/Increase time)  Increased time   Ambulation/Gait Assistance Details Pt ambulated over even/uneven without LOB.   Ambulation Distance (Feet) 450 Feet  no AD   Assistive device None   Gait Pattern Step-through pattern;Narrow base of support   Ambulation Surface Level;Unlevel;Indoor   Gait velocity 3.71f/sec.  no AD   Ramp 7: Independent   Ramp Details (indicate cue type and reason) x1. Pt demonstrated safe technique and no LOB.   Curb 7: Independent   Curb Details (indicate cue type and reason) x1. Pt demonstrated safe technique.   Standardized Balance Assessment   Standardized Balance Assessment Berg Balance Test   Berg Balance Test   Sit to Stand  Able to stand without using hands and stabilize independently   Standing Unsupported Able to stand safely 2 minutes   Sitting with Back Unsupported but Feet Supported on Floor or Stool Able to sit safely and securely 2 minutes   Stand to Sit Sits safely with minimal use of hands   Transfers Able to transfer safely, minor use of hands   Standing Unsupported with Eyes Closed Able to stand 10 seconds safely   Standing Ubsupported with Feet Together Able to place feet together independently and stand 1 minute safely   From Standing, Reach Forward with Outstretched Arm Can reach confidently >25 cm (10")  10"   From Standing Position, Pick up Object from Floor Able to pick up shoe safely and easily   From Standing Position, Turn to Look Behind Over each Shoulder Looks behind from both sides and weight shifts well   Turn 360 Degrees Able to turn 360 degrees safely in 4 seconds or less   Standing Unsupported, Alternately Place Feet on Step/Stool Able to stand independently and safely and complete 8 steps in 20 seconds   Standing Unsupported, One Foot in Front Able to plae foot ahead of the other independently and hold 30 seconds   Standing on One Leg Able to lift leg independently and hold 5-10 seconds   Total Score 54                PT Education - 10/28/14 1309    Education provided Yes   Education Details PT encouraged pt to continue with HEP and to call [facility] regarding status or ordering B AFOs. Pt has his contact information. Pt was able to verbalize fall prevention strategies.   Person(s) Educated Patient   Methods Explanation   Comprehension Verbalized understanding          PT Short Term Goals - 10/12/14 1241    PT SHORT TERM GOAL #1   Title Pt will be independent in HEP to improve functional mobility. Target date: 10/12/14.   Status Achieved   PT SHORT TERM GOAL #2   Title Pt will verbalize energy conservation strategies to decrease fatigue and overuse of muscles.  Target date: 10/12/14.   Status Achieved   PT SHORT TERM GOAL #3   Title Pt will report no falls in the last 4 weeks to improve safety during ambulation. Target dtae: 10/12/14.   Status Achieved           PT Long Term Goals - 10/28/14 1311    PT LONG TERM GOAL #1   Title Pt will improve BERG balance score to >/=54/56 to reduce falls risk. Target date: 10/30/14.   Baseline Revised on 10/12/14,  as pt scored 50/56 on 10/12/14.   Status Achieved   PT LONG TERM GOAL #2   Title Pt will verbalize understanding of fall prevention strategies to decrease falls risk. Target date: 10/30/14.   Status Achieved   PT LONG TERM GOAL #3   Title Pt will perform TUG, without LRAD, in </=13.5 seconds to decrease falls risk. Target date: 11/09/14.   Status Achieved   PT LONG TERM GOAL #4   Title Pt will ambulate 400', with LRAD, over even/uneven terrain at MOD I level to improve functional mobility. Target date: 10/30/14.   Status Achieved   PT LONG TERM GOAL #5   Title Pt will improve gait speed to >/=2.39f/sec. to be a cHydrographic surveyor Target date: 11/09/14.   Status Achieved   PT LONG TERM GOAL #6   Title Pt will improve FOTO score by 10 points to improve quality of life. Target date: 10/30/14.   Baseline 11 point improvement from FOTO risk adjustment score.   Status Achieved   PT LONG TERM GOAL #7   Title Pt will improve FGA score to >/=25 to decrease falls risk. Target date: 10/30/14.   Baseline 24   Status Partially Met               Plan - 02016/02/271310    Clinical Impression Statement Pt is discharging from PT due to great progress. See d/c summary for details.          G-Codes - 002/27/20161312    Functional Assessment Tool Used BERG: 54/56; gait speed without AD: 3.787fsec.; TUG without AD: 10.94sec.   Functional Limitation Mobility: Walking and moving around   Mobility: Walking and Moving Around Goal Status ([id number] least 1 percent but less than 20 percent impaired, limited or  restricted   Mobility: Walking and Moving Around Discharge Status ([id number] least 1 percent but less than 20 percent impaired, limited or restricted      Problem List Patient Active Problem List   Diagnosis Date Noted  . SOB (shortness of breath) 10/02/2014  . 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 Infection due to trichomonas vaginalis 09/15/2014  . Myotonic dystrophy, type 1 09/14/2014   PHYSICAL THERAPY DISCHARGE SUMMARY  Visits from Start of Care: 9  Current functional level related to goals / functional outcomes:     PT Long Term Goals - 02February 27, 2016311    PT LONG TERM GOAL #1   Title Pt will improve BERG balance score to >/=54/56 to reduce falls risk. Target date: 10/30/14.   Baseline Revised on 10/12/14, as pt scored 50/56 on 10/12/14.   Status Achieved   PT LONG TERM GOAL #2   Title Pt will verbalize understanding of fall prevention strategies to decrease falls risk. Target date: 10/30/14.   Status Achieved   PT LONG TERM GOAL #3   Title Pt will perform TUG, without LRAD, in </=13.5 seconds to decrease falls risk. Target date: 11/09/14.   Status Achieved   PT LONG TERM GOAL #4   Title Pt will ambulate 400', with LRAD, over even/uneven terrain at MOD I level to improve functional mobility. Target date: 10/30/14.   Status Achieved   PT LONG TERM GOAL #5   Title Pt will improve gait speed to >/=2.6278fec. to be a comHydrographic surveyorarget date: 11/09/14.   Status Achieved   PT LONG TERM GOAL #6   Title Pt will improve FOTO score by 10 points to improve  quality of life. Target date: 10/30/14.   Baseline 11 point improvement from FOTO risk adjustment score.   Status Achieved   PT LONG TERM GOAL #7   Title Pt will improve FGA score to >/=25 to decrease falls risk. Target date: 10/30/14.   Baseline 24   Status Partially Met        Remaining deficits: Poor B toe clearance when ambulating long distances, and impaired balance during  high level balance activities.   Education / Equipment: HEP and AFO consultation  Plan: Patient agrees to discharge.  Patient goals were met. Patient is being discharged due to meeting the stated rehab goals.  ?????       Miller,Jennifer L 10/28/2014, 1:15 PM  [facility] [address] [place], [place], [address] Phone: [phone]   Fax:  [phone]     Geoffry Paradise, PT,DPT 10/28/2014 1:15 PM Phone: [phone] Fax: [phone]

## 2014-11-01 ENCOUNTER — Ambulatory Visit (HOSPITAL_BASED_OUTPATIENT_CLINIC_OR_DEPARTMENT_OTHER): Payer: Medicare (Managed Care) | Attending: Internal Medicine

## 2014-11-01 VITALS — Ht 62.75 in | Wt 108.0 lb

## 2014-11-01 DIAGNOSIS — G4733 Obstructive sleep apnea (adult) (pediatric): Secondary | ICD-10-CM | POA: Diagnosis present

## 2014-11-06 ENCOUNTER — Telehealth: Payer: Self-pay | Admitting: Emergency Medicine

## 2014-11-06 NOTE — Telephone Encounter (Signed)
Patient has paperwork that can be picked up.

## 2014-11-07 DIAGNOSIS — G4733 Obstructive sleep apnea (adult) (pediatric): Secondary | ICD-10-CM | POA: Diagnosis not present

## 2014-11-07 NOTE — Sleep Study (Signed)
   NAME: Cynthia Underwood DATE OF BIRTH:  August 01, 1971 MEDICAL RECORD NUMBER [id number]  LOCATION: [facility]  PHYSICIAN: Ludwin Flahive D  DATE OF STUDY: 11/01/2014  SLEEP STUDY TYPE: Nocturnal Polysomnogram               REFERRING PHYSICIAN: Lance Bosch, NP  INDICATION FOR STUDY: Hypersomnia with sleep apnea  EPWORTH SLEEPINESS SCORE:   11/24 HEIGHT: 5' 2.75" (159.4 cm)  WEIGHT: 48.988 kg (108 lb)    Body mass index is 19.28 kg/(m^2).  NECK SIZE:   12 in.  MEDICATIONS: Charted for review  SLEEP ARCHITECTURE: Total sleep time 337.5 minutes with sleep efficiency 93.1%. Stage I was 8%, stage II 67.9%, stage III 0.1%, REM 24% of total sleep time. Sleep latency 11 minutes, REM latency 9.5 minutes, awake after sleep onset 14 minutes, arousal index 8.2, bedtime medication: None  RESPIRATORY DATA: Apnea hypopnea index (AHI) 3.0 per hour. 17 total events scored including 2 central apneas and 15 hypopneas. Events were more frequent while supine. REM AHI 8.9 per hour. CPAP titration was not done.  OXYGEN DATA: Mild snoring with oxygen desaturation to a nadir of 84% and mean saturation 95.3% on room air.  CARDIAC DATA: Normal sinus rhythm  MOVEMENT/PARASOMNIA: No significant movement disturbance, no bathroom trips  IMPRESSION/ RECOMMENDATION:   1) Normal sleep pattern without medication reported. The patient spent more time in REM and developed REM sleep earlier in the night than is typical. This may have been incidental on this study night but increased time in rem can be associated with rebound if REM had been prevented by inadequate sleep or by antidepressants in the nights before the study. The patient indicated her usual sleep pattern is to be up during the night, going to sleep during the early morning hours. Discussion of sleep habits may be helpful. 2) Occasional respiratory event with sleep disturbance, within normal limits. AHI 3.0 per hour. The normal range  for adults is an AHI from 0-5 events per hour. Mild snoring with oxygen desaturation to a nadir of 84% and mean saturation 95.3% on room air.   [name] Diplomate, American Board of Sleep Medicine  ELECTRONICALLY SIGNED ON:  11/07/2014, 2:13 PM [facility] PH: [phone]   FX: [phone] [place]

## 2014-11-24 NOTE — Telephone Encounter (Signed)
The original Paperwork came back needing additional clarification. Same was fixed, signed, and faxed back to [facility]

## 2014-12-28 ENCOUNTER — Encounter: Payer: Self-pay | Admitting: Cardiology

## 2015-03-19 ENCOUNTER — Ambulatory Visit: Payer: Medicare Other | Admitting: Neurology

## 2015-04-01 ENCOUNTER — Ambulatory Visit (INDEPENDENT_AMBULATORY_CARE_PROVIDER_SITE_OTHER): Payer: Medicare Other | Admitting: Neurology

## 2015-04-01 ENCOUNTER — Encounter: Payer: Self-pay | Admitting: Neurology

## 2015-04-01 VITALS — BP 90/60 | HR 68 | Ht 62.75 in | Wt 107.1 lb

## 2015-04-01 DIAGNOSIS — R51 Headache: Secondary | ICD-10-CM

## 2015-04-01 DIAGNOSIS — G7111 Myotonic muscular dystrophy: Secondary | ICD-10-CM

## 2015-04-01 DIAGNOSIS — F17200 Nicotine dependence, unspecified, uncomplicated: Secondary | ICD-10-CM

## 2015-04-01 DIAGNOSIS — Z720 Tobacco use: Secondary | ICD-10-CM

## 2015-04-01 DIAGNOSIS — R519 Headache, unspecified: Secondary | ICD-10-CM

## 2015-04-01 MED ORDER — TOPIRAMATE 25 MG PO TABS: 25.0000 mg | ORAL_TABLET | Freq: Every day | ORAL | Status: DC

## 2015-04-01 NOTE — Patient Instructions (Addendum)
1.  Start topirmate 25mg  daily 2.  Please let me know if you are able to coordinate a ride to [facility] to be evaluated in the [facility] 3.  Keep wearing your braces and use a walker as needed 4.  Use heat or ice to your neck 5.  Return to clinic in 71-months

## 2015-04-01 NOTE — Progress Notes (Signed)
Follow-up Visit   Date: 04/01/2015    Cynthia Underwood MRN: [id number] DOB: [birth year]   Interim History: Cynthia Underwood is a 44 y.o. right-handed African American female with myotonic dystrophy type I and hyperlipidemia returning to the clinic for follow-up of myotonic dystrophy type I.  The patient was accompanied to the clinic by self.  History of present illness: Starting around the age of 40, she started developed lock jaw and stiffness of the hands but thought that it was normal so did not make much of it. Over the years, she developed worsening stiffness of her fingers and hands. Because her maternal aunt, who is also a patient of mine, had known myotonic dystrophy, she ultimately was genetically tested at the age of 76 which confirmed the diagnosis of myotonic dystrophy type 1. She has a strong family history of DM1 including maternal aunt, cousins x 2, and younger sister. She does not have any children and has no future plans for pregnancy.  Symptoms were relatively stable until her mid-30s and then started developing fatigue, weakness, daytime sleepiness, and shortness of breath with exertion. She is seeing Dr. Radford Pax in cardiology for surveillance and her recent echocardiogram was within normal limits. Cardiac MRI is pending. She reports to falling three times since August 2015. She walks independently but was told previously told to use leg braces, but does not use it due to discomfort.  Over the past few years, she noticed intermittent difficulty swallowing liquids. She underwent barium swallow last year which reportedly showed signs of aspiration and she was recommended to use a straw and use chin tuck position. She does not have a good appetite and endorses weight loss. She drink 1 bottle of ensure daily, if that.   Her last eye evaluation was in 2015 which showed astigmatism. No evidence of cataracts.  Recent blood work does not show evidence of  diabetes.  She was seeing several neurologists over the years at [facility] in [place]. She is not working and has been on disability since 2010  UPDATE 04/01/2015:  Overall, there has been worsening weakness of her legs, especially the right.  Sometimes, she has to lift her left leg out of the car because it is so weak.  She feels that she no longer has any good days, they are either "bad or worse".  She does have spells of difficulty swallowing, but over the years has learned out to best minimize this.  Her weight has been relatively stable since her last visit (2lb weight loss).  She did start adding 2-3 cans of ensure to her diet as recommended.  She is sleeping on 2 pillows for some time and denies any shortness of breath.  Muscle stiffness and pain is not has bad currently.   Her new complaint is headaches with biparietal throbbing pain, which lasts several hours, occuring about 3 times per week.  When the pain is severe, she simply rests and sleeps.  She takes ibuprofen which sometimes helps. She reports worsening pain with loud noises.  No photophobia or nausea.   Medications:  Current Outpatient Prescriptions on File Prior to Visit  Medication Sig Dispense Refill  . naproxen (NAPROSYN) 375 MG tablet Take 375 mg by mouth. 1 tablet as needed for menstrual cramping.    Marland Kitchen UNABLE TO FIND B Toe-off (2.0) AFOs 2 each 0   No current facility-administered medications on file prior to visit.    Allergies:  Allergies  Allergen Reactions  .  Penicillins Nausea And Vomiting, Rash and Other (See Comments)    fever    Review of Systems:  CONSTITUTIONAL: No fevers, chills, night sweats, or weight loss.  EYES: No visual changes or eye pain ENT: No hearing changes.  No history of nose bleeds.   RESPIRATORY: No cough, wheezing and shortness of breath.   CARDIOVASCULAR: Negative for chest pain, and palpitations.   GI: Negative for abdominal discomfort, blood in stools or black stools.   No recent change in bowel habits.   GU:  No history of incontinence.   MUSCLOSKELETAL: No history of joint pain or swelling.  No myalgias.   SKIN: Negative for lesions, rash, and itching.   ENDOCRINE: Negative for cold or heat intolerance, polydipsia or goiter.   PSYCH:  No depression or anxiety symptoms.   NEURO: As Above.   Vital Signs:  BP 90/60 mmHg  Pulse 68  Ht 5' 2.75" (1.594 m)  Wt 107 lb 2 oz (48.592 kg)  BMI 19.12 kg/m2  SpO2 98%  Neurological Exam: MENTAL STATUS including orientation to time, place, person, recent and remote memory, attention span and concentration, language, and fund of knowledge is normal.  Speech is mildly dysarthric and hypophonic.  CRANIAL NERVES: Pupils equal round and reactive to light.  Normal conjugate, extra-ocular eye movements in all directions of gaze. Mild bilateral ptosis.  No eyelid myotonia. Normal facial sensation. Typical mytonic facies with temporal wasting and transverse smile. Mild facial weakness as noted frontalis and oribicularis oculi 5-/5, unable to keep air in cheeks, oribicularis oris 4/5.  Palate elevates symmetrically.  Tongue is midline.   MOTOR:  Generalized loss of muscle bulk. Mild grip myotonia bilaterally. No percussion myotonia. No fasciculations or abnormal movements.  No pronator drift.  Tone is normal.    Right Upper Extremity:    Left Upper Extremity:    Deltoid  4/5   Deltoid  4/5   Biceps  4/5   Biceps  4/5   Triceps  4/5   Triceps  4/5   Wrist extensors  4/5   Wrist extensors  4/5   Wrist flexors  4/5   Wrist flexors  4/5   Finger extensors  4/5   Finger extensors  4/5   Finger flexors  4/5   Finger flexors  4/5   Dorsal interossei  4/5   Dorsal interossei  4/5   Abductor pollicis  4/5   Abductor pollicis  4/5   Tone (Ashworth scale)  0  Tone (Ashworth scale)  0   Right Lower Extremity:    Left Lower Extremity:    Hip flexors  3+/5   Hip flexors  4-/5   Hip extensors  4-/5   Hip extensors  4/5   Knee  flexors  4-/5   Knee flexors  4/5   Knee extensors  4-/5   Knee extensors  4/5   Dorsiflexors  4-/5   Dorsiflexors  4/5   Plantarflexors  4-/5   Plantarflexors  4/5   Toe extensors  4-/5   Toe extensors  4/5   Toe flexors  4-/5   Toe flexors  4/5   Tone (Ashworth scale)  0  Tone (Ashworth scale)  0   MSRs:  Reflexes are 2+/4 throughout, except 1+ bilateral Achilles.  COORDINATION/GAIT:  She is able to rise from chair without using arms.  Gait is slightly wide-based and stable, assisted with bilateral AFO.    Data: Echocardiogram 10/08/2014:  Normal   Lab Results  Component  Value Date   HGBA1C 5.6 08/26/2014   Lab Results  Component Value Date   TSH 0.472 08/26/2014   Lab Results  Component Value Date   TDSKAJGO11 572 10/16/2014   Lab Results  Component Value Date   FOLATE 5.4 10/16/2014     IMPRESSION/PLAN: 1.  Myotonic dystrophy type I, genetically confirmed and with strong family history.  Since her last visit there has been an interval worsening in her right > left leg strength.  She is wearing bilateral AFO which helps and continues with her home exercises. I also encouraged her to use a rollator for long distances. She does not seem to have any other complications of myotonic dystrophy such as cardiac disease, cataracts, and diabetes at this time, but I stressed the importance of annual surveillance.  She is overdue for her eye exam.  I would like her evaluated at [facility] to see if she is a candidate for any research trials, but transportation is difficult for her right now.   2.  Chronic daily headaches, new.  No features on exam to suggest worrisome intracranial problem, so will treat symptomatically with topiramate 25mg  daily.  Common side effects discussed and she does not have any plans for pregnancy.   If there is worsening of headaches, imaging can be done.    Return to clinic in 3 months   The duration of this appointment visit was  40 minutes of face-to-face time with the patient.  Greater than 50% of this time was spent in counseling, explanation of diagnosis, planning of further management, and coordination of care.   Thank you for allowing me to participate in patient's care.  If I can answer any additional questions, I would be pleased to do so.    Sincerely,    Tayshon Winker K. Posey Pronto, DO

## 2015-05-10 NOTE — Progress Notes (Signed)
Cardiology Office Note   Date:  05/11/2015   ID:  Cynthia Underwood 1970/11/16, MRN [id number]  PCP:  Lance Bosch, NP    Chief Complaint  Patient presents with  . RBBB      History of Present Illness: Cynthia Underwood is a 44 y.o. female who presents for followup of palpitations. She has a history of myotonic dystrophy Type I since age 72 with intermittent jaw locking, finger stiffness and difficulty relaxing muscles. She was diagnosed by genetic testing.  She is followed with Neurology. She denies any chest pain, syncope or palpitations. Occasionally she has some dizziness. She has chronic DOE and fatigue going up stairs related to her myotonic dystrophy.  She has occasional swelling in her feet.    Past Medical History  Diagnosis Date  . Myotonic dystrophy, type 1     Genetically confirmed  . Anemia   . Excess ear wax   . Watery eyes     Left  . Headache   . Weakness of both legs   . Depression   . Bifascicular block 10/02/2014  . 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    Past Surgical History  Procedure Laterality Date  . Biopsy breast Right      Current Outpatient Prescriptions  Medication Sig Dispense Refill  . diphenhydrAMINE (BENADRYL) 25 MG tablet Take 25 mg by mouth every 6 (six) hours as needed.    . naproxen (NAPROSYN) 375 MG tablet Take 375 mg by mouth. 1 tablet as needed for menstrual cramping.    . topiramate (TOPAMAX) 25 MG tablet Take 1 tablet (25 mg total) by mouth daily. 30 tablet 3   No current facility-administered medications for this visit.    Allergies:   Penicillins    Social History:  The patient  reports that she has been smoking Cigarettes.  She has a 6.5 pack-year smoking history. She has never used smokeless tobacco. She reports that she drinks alcohol. She reports that she uses illicit drugs.   Family History:  The patient's family history includes COPD in her  mother; Cancer (age of onset: 8) in her father; Diabetes in her maternal uncle and paternal grandmother; Heart disease in her father, maternal uncle, and paternal grandmother; Hypertension in her paternal grandmother; Neuromuscular disorder in her cousin, maternal aunt, and sister.    ROS:  Please see the history of present illness.   Otherwise, review of systems are positive for none.   All other systems are reviewed and negative.    PHYSICAL EXAM: VS:  BP 92/58 mmHg  Pulse 68  Ht 5' 2.75" (1.594 m)  Wt 106 lb 12.8 oz (48.444 kg)  BMI 19.07 kg/m2  SpO2 99% , BMI Body mass index is 19.07 kg/(m^2). GEN: Well nourished, well developed, in no acute distress HEENT: normal Neck: no JVD, carotid bruits, or masses Cardiac: RRR; no murmurs, rubs, or gallops,no edema  Respiratory:  clear to auscultation bilaterally, normal work of breathing GI: soft, nontender, nondistended, + BS MS: no deformity or atrophy Skin: warm and dry, no rash Neuro:  Strength and sensation are intact Psych: euthymic mood, full affect   EKG:  EKG is not ordered today.    Recent Labs: 05/16/2014: ALT 21; BUN 15; Hemoglobin 12.8; Platelets 307; Potassium 4.5; Sodium 140 08/26/2014: TSH 0.472 10/22/2014: Creatinine, Ser 0.70    Lipid Panel  Component Value Date/Time   CHOL 205* 08/26/2014 1516   TRIG 102 08/26/2014 1516   HDL 59 08/26/2014 1516   CHOLHDL 3.5 08/26/2014 1516   VLDL 20 08/26/2014 1516   LDLCALC 126* 08/26/2014 1516      Wt Readings from Last 3 Encounters:  05/11/15 106 lb 12.8 oz (48.444 kg)  04/01/15 107 lb 2 oz (48.592 kg)  11/01/14 108 lb (48.988 kg)     ASSESSMENT AND PLAN:  1. Myoclonic dystrophy followed by Neuro. She has been followed by yearly echo in [place].Recent echo showed normal LVF with EF 65-70%  2. Chronic SOB -  She has a bifasciciular block with family history of sarcoidosis raising the suspicion that she may have sarcoidosis. Cardiac MRI showed small area of  patchy gadolinium uptake at the apex and mid basal anterior wall which is not classic for sarcoid with poor quality study.  F/u MRI in 1 year. 3. Irregular heart beat - she has worn an event monitor in the past which showed PAC's ad PVC's. She denies any palpitations or syncope. 4. Family history of sarcoidosis and myoclonic dystrophy - her dad had a heart transplant she thinks because of sarcoid   Current medicines are reviewed at length with the patient today.  The patient does not have concerns regarding medicines.  The following changes have been made:  no change  Labs/ tests ordered today: See above Assessment and Plan No orders of the defined types were placed in this encounter.     Disposition:   FU with me in 1 year  Signed, Sueanne Margarita, MD  05/11/2015 1:33 PM    [facility] [address], [place], [place]  [address] Phone: [phone]; Fax: [phone]

## 2015-05-11 ENCOUNTER — Ambulatory Visit (INDEPENDENT_AMBULATORY_CARE_PROVIDER_SITE_OTHER): Payer: Medicare Other | Admitting: Cardiology

## 2015-05-11 ENCOUNTER — Encounter: Payer: Self-pay | Admitting: Cardiology

## 2015-05-11 VITALS — BP 92/58 | HR 68 | Ht 62.75 in | Wt 106.8 lb

## 2015-05-11 DIAGNOSIS — R0602 Shortness of breath: Secondary | ICD-10-CM | POA: Diagnosis not present

## 2015-05-11 DIAGNOSIS — G7111 Myotonic muscular dystrophy: Secondary | ICD-10-CM

## 2015-05-11 DIAGNOSIS — I452 Bifascicular block: Secondary | ICD-10-CM

## 2015-05-11 NOTE — Patient Instructions (Signed)
Medication Instructions:  Your physician recommends that you continue on your current medications as directed. Please refer to the Current Medication list given to you today.   Labwork: None  Testing/Procedures: Your physician has requested that you have an echocardiogram in February, 2017. Echocardiography is a painless test that uses sound waves to create images of your heart. It provides your doctor with information about the size and shape of your heart and how well your heart's chambers and valves are working. This procedure takes approximately one hour. There are no restrictions for this procedure.   Follow-Up: Your physician wants you to follow-up in: 1 year with Dr. Radford Pax. You will receive a reminder letter in the mail two months in advance. If you don't receive a letter, please call our office to schedule the follow-up appointment.   Any Other Special Instructions Will Be Listed Below (If Applicable).

## 2015-07-01 ENCOUNTER — Ambulatory Visit: Payer: Medicare Other | Attending: Internal Medicine | Admitting: Internal Medicine

## 2015-07-01 ENCOUNTER — Encounter: Payer: Self-pay | Admitting: Internal Medicine

## 2015-07-01 VITALS — BP 124/76 | HR 76 | Temp 98.8°F | Resp 16 | Ht 63.0 in | Wt 103.8 lb

## 2015-07-01 DIAGNOSIS — Z23 Encounter for immunization: Secondary | ICD-10-CM

## 2015-07-01 DIAGNOSIS — F32A Depression, unspecified: Secondary | ICD-10-CM

## 2015-07-01 DIAGNOSIS — R6889 Other general symptoms and signs: Secondary | ICD-10-CM | POA: Insufficient documentation

## 2015-07-01 DIAGNOSIS — Z880 Allergy status to penicillin: Secondary | ICD-10-CM | POA: Diagnosis not present

## 2015-07-01 DIAGNOSIS — Z8249 Family history of ischemic heart disease and other diseases of the circulatory system: Secondary | ICD-10-CM | POA: Diagnosis not present

## 2015-07-01 DIAGNOSIS — H04202 Unspecified epiphora, left lacrimal gland: Secondary | ICD-10-CM | POA: Diagnosis not present

## 2015-07-01 DIAGNOSIS — F329 Major depressive disorder, single episode, unspecified: Secondary | ICD-10-CM | POA: Diagnosis not present

## 2015-07-01 DIAGNOSIS — G4700 Insomnia, unspecified: Secondary | ICD-10-CM | POA: Insufficient documentation

## 2015-07-01 DIAGNOSIS — G7111 Myotonic muscular dystrophy: Secondary | ICD-10-CM | POA: Insufficient documentation

## 2015-07-01 DIAGNOSIS — F17210 Nicotine dependence, cigarettes, uncomplicated: Secondary | ICD-10-CM | POA: Diagnosis not present

## 2015-07-01 MED ORDER — TRAZODONE HCL 50 MG PO TABS: 25.0000 mg | ORAL_TABLET | Freq: Every evening | ORAL | Status: DC | PRN

## 2015-07-01 NOTE — Progress Notes (Signed)
Patient ID: Cynthia Underwood, female   DOB: Jan 24, 1971, 44 y.o.   MRN: [id number]  CC: eye tearing, insomnia   HPI: Cynthia Underwood is a 44 y.o. female here today for a follow up visit.  Patient has past medical history of myotonic dystrophy, anemia, depression, and RBBB. Patient reports that someone living in her home has shingles and she would like the vaccine. She does admit to having shingles over one year ago but never received the vaccine.  She states that she has been having problems with sleep and appetite for the past 1 month. Patient reports that her grandfather passed away and thats when she noticed the problem. She states that when she does eat she only takes a couple small bits and then she feels full. She reports sleeping around 1 hour and then waking up. She has been on Trazodone in the past for sleep and also received counseling while in [place] for depression.  Left eye tearing for the 3 months. She has tried benadryl without relief and allergy eye drops. She is concerned because 2 months ago she would find black stuff in her eyes. No cough, sneezing, itchy throat, fevers, chills.  Patient has No headache, No chest pain, No abdominal pain - No Nausea, No new weakness tingling or numbness, No Cough - SOB.  Allergies  Allergen Reactions  . Penicillins Nausea And Vomiting, Rash and Other (See Comments)    fever   Past Medical History  Diagnosis Date  . Myotonic dystrophy, type 1 ([place])     Genetically confirmed  . Anemia   . Excess ear wax   . Watery eyes     Left  . Headache   . Weakness of both legs   . Depression   . Bifascicular block 10/02/2014  . 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   Current Outpatient Prescriptions on File Prior to Visit  Medication Sig Dispense Refill  . diphenhydrAMINE (BENADRYL) 25 MG tablet Take 25 mg by mouth every 6 (six) hours as needed.    . naproxen (NAPROSYN) 375 MG tablet Take 375 mg by mouth. 1  tablet as needed for menstrual cramping.    . topiramate (TOPAMAX) 25 MG tablet Take 1 tablet (25 mg total) by mouth daily. 30 tablet 3   No current facility-administered medications on file prior to visit.   Family History  Problem Relation Age of Onset  . Cancer Father 52    Deceased  . Heart disease Father   . Diabetes Maternal Uncle   . Heart disease Maternal Uncle   . Heart disease Paternal Grandmother   . Diabetes Paternal Grandmother   . Hypertension Paternal Grandmother   . COPD Mother   . Neuromuscular disorder Maternal Aunt     Myotonic dystrophy  . Neuromuscular disorder Cousin     Myotonic dystrophy  . Neuromuscular disorder Sister     Myotonic dystrophy   Social History   Social History  . Marital Status: Single    Spouse Name: N/A  . Number of Children: 0  . Years of Education: 1.5 Collge   Occupational History  . Unemployed Other   Social History Main Topics  . Smoking status: Current Every Day Smoker -- 0.25 packs/day for 26 years    Types: Cigarettes  . Smokeless tobacco: Never Used  . Alcohol Use: 0.0 oz/week    0 Standard drinks or equivalent per week     Comment: Rarely only holidays  . Drug  Use: Yes     Comment: Marijuana  . Sexual Activity: Not Currently    Birth Control/ Protection: None   Other Topics Concern  . Not on file   Social History Narrative   Patient lives at home with her aunt.   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  She moved to [facility] in August 2015.   She has been on disability since 2010.   Education: 1 1/2 years of college.       Review of Systems: Other than what is stated in HPI, all other systems are negative.   Objective:   Filed Vitals:   07/01/15 1147  BP: 124/76  Pulse: 76  Temp: 98.8 F (37.1 C)  Resp: 16    Physical Exam  Constitutional: She is oriented to person, place, and time.  HENT:  Right Ear: External ear normal.  Left Ear: External ear normal.  Nose: Nose normal.   Mouth/Throat: Oropharynx is clear and moist. No oropharyngeal exudate.  Eyes: EOM are normal. Pupils are equal, round, and reactive to light. Right eye exhibits no discharge. Left eye exhibits discharge (clear, tearing).  Inflamed tear duct  Cardiovascular: Normal rate, regular rhythm and normal heart sounds.   Pulmonary/Chest: Effort normal and breath sounds normal.  Neurological: She is alert and oriented to person, place, and time.  Skin: Skin is warm and dry.     Lab Results  Component Value Date   WBC 5.0 05/16/2014   HGB 12.8 05/16/2014   HCT 39.3 05/16/2014   MCV 95.4 05/16/2014   PLT 307 05/16/2014   Lab Results  Component Value Date   CREATININE 0.70 10/22/2014   BUN 15 05/16/2014   NA 140 05/16/2014   K 4.5 05/16/2014   CL 102 05/16/2014   CO2 27 05/16/2014    Lab Results  Component Value Date   HGBA1C 5.6 08/26/2014   Lipid Panel     Component Value Date/Time   CHOL 205* 08/26/2014 1516   TRIG 102 08/26/2014 1516   HDL 59 08/26/2014 1516   CHOLHDL 3.5 08/26/2014 1516   VLDL 20 08/26/2014 1516   LDLCALC 126* 08/26/2014 1516       Assessment and plan:   Cynthia Underwood was seen today for insomnia.  Diagnoses and all orders for this visit:  Eye tearing, left -     Ambulatory referral to Ophthalmology Tearing likely from inflamed tear duct, will refer out.   Depression -     Ambulatory referral to Psychiatry  Insomnia -     Begin traZODone (DESYREL) 50 MG tablet; Take 0.5 tablets (25 mg total) by mouth at bedtime as needed for sleep.  Need for prophylactic vaccination and inoculation against influenza -     Flu Vaccine QUAD 36+ mos PF IM (Fluarix & Fluzone Quad PF)   Return if symptoms worsen or fail to improve.       Lance Bosch, [facility] [phone] 07/01/2015, 12:12 PM

## 2015-07-01 NOTE — Patient Instructions (Signed)
[facility] ? Address: [address], [place], [place] [address] Hours: Open today  8AM-5PM

## 2015-07-01 NOTE — Progress Notes (Signed)
Patient complains of not being able to sleep or eat Patient also complains of her left eye tearing alot Patient is requesting a RX for shingles vaccine

## 2015-07-05 ENCOUNTER — Ambulatory Visit (INDEPENDENT_AMBULATORY_CARE_PROVIDER_SITE_OTHER): Payer: Medicare Other | Admitting: Neurology

## 2015-07-05 ENCOUNTER — Telehealth: Payer: Self-pay

## 2015-07-05 ENCOUNTER — Encounter: Payer: Self-pay | Admitting: Neurology

## 2015-07-05 VITALS — BP 100/60 | HR 71 | Wt 106.0 lb

## 2015-07-05 DIAGNOSIS — M797 Fibromyalgia: Secondary | ICD-10-CM | POA: Diagnosis not present

## 2015-07-05 DIAGNOSIS — R5383 Other fatigue: Secondary | ICD-10-CM

## 2015-07-05 DIAGNOSIS — R51 Headache: Secondary | ICD-10-CM

## 2015-07-05 DIAGNOSIS — R519 Headache, unspecified: Secondary | ICD-10-CM

## 2015-07-05 DIAGNOSIS — G7111 Myotonic muscular dystrophy: Secondary | ICD-10-CM | POA: Diagnosis not present

## 2015-07-05 DIAGNOSIS — R5381 Other malaise: Secondary | ICD-10-CM

## 2015-07-05 DIAGNOSIS — F17200 Nicotine dependence, unspecified, uncomplicated: Secondary | ICD-10-CM

## 2015-07-05 DIAGNOSIS — Z131 Encounter for screening for diabetes mellitus: Secondary | ICD-10-CM

## 2015-07-05 MED ORDER — DULOXETINE HCL 30 MG PO CPEP: 30.0000 mg | ORAL_CAPSULE | Freq: Every day | ORAL | Status: DC

## 2015-07-05 NOTE — Patient Instructions (Addendum)
1.  Check blood work 2.  Start cymbalta 30mg  daily 3.  Stop topiramate 4.  Start home PT and occupational therapy 5.  Be sure to get your eyes checked 6.  Return to clinic in 3-4 months

## 2015-07-05 NOTE — Progress Notes (Signed)
Follow-up Visit   Date: 07/05/2015    Cynthia Underwood MRN: [id number] DOB: 19-Apr-1971   Interim History: Carney Living Plato is a 44 y.o. right-handed African American female with myotonic dystrophy type I and hyperlipidemia returning to the clinic for follow-up of myotonic dystrophy type I.  The patient was accompanied to the clinic by self.  History of present illness: Starting around the age of 38, she started developed lock jaw and stiffness of the hands but thought that it was normal so did not make much of it. Over the years, she developed worsening stiffness of her fingers and hands. Because her maternal aunt, who is also a patient of mine, had known myotonic dystrophy, she ultimately was genetically tested at the age of 24 which confirmed the diagnosis of myotonic dystrophy type 1. She has a strong family history of DM1 including maternal aunt, cousins x 2, and younger sister. She does not have any children and has no future plans for pregnancy.  Symptoms were relatively stable until her mid-30s and then started developing fatigue, weakness, daytime sleepiness, and shortness of breath with exertion. She is seeing Dr. Radford Pax in cardiology for surveillance and her recent echocardiogram was within normal limits. Cardiac MRI is pending. She reports to falling three times since August 2015. She walks independently but was told previously told to use leg braces, but does not use it due to discomfort.  Over the past few years, she noticed intermittent difficulty swallowing liquids. She underwent barium swallow last year which reportedly showed signs of aspiration and she was recommended to use a straw and use chin tuck position. She does not have a good appetite and endorses weight loss. She drink 1 bottle of ensure daily, if that.   She was seeing several neurologists over the years at [facility] in [place]. She is not working and has been on disability since  2010.  UPDATE 04/01/2015:  Overall, there has been worsening weakness of her legs, especially the right.  Sometimes, she has to lift her left leg out of the car because it is so weak.  She feels that she no longer has any good days, they are either "bad or worse".  She does have spells of difficulty swallowing, but over the years has learned out to best minimize this.  Her weight has been relatively stable since her last visit (2lb weight loss).  She did start adding 2-3 cans of ensure to her diet as recommended.  She is sleeping on 2 pillows for some time and denies any shortness of breath.  Muscle stiffness and pain is not has bad currently.   Her new complaint is headaches with biparietal throbbing pain, which lasts several hours, occuring about 3 times per week.  When the pain is severe, she simply rests and sleeps.  She takes ibuprofen which sometimes helps. She reports worsening pain with loud noises.  No photophobia or nausea.  UPDATE 07/05/2015:  She no longer has headaches since starting topiramate 25mg .  She reports having muscle aches of the legs, especially when walking and reports lack of energy. She was recently started on trazadone which is helping her sleep.  She does complain of lack of appetite.  Her primary care provider gave ensure to help. No interval falls.  Climbing stairs is becoming more and more diffficult and her aunt is planning on moving her bedroom to the first floor, but unfortunately there is no shower.  They have discussed alternative options, such as  moving into a one-story home, but resources are not available at this time.  Her mood is down and she is planning on seeing psychiatrist soon.   Medications:  Current Outpatient Prescriptions on File Prior to Visit  Medication Sig Dispense Refill  . traZODone (DESYREL) 50 MG tablet Take 0.5 tablets (25 mg total) by mouth at bedtime as needed for sleep. 30 tablet 1   No current facility-administered medications on file prior  to visit.    Allergies:  Allergies  Allergen Reactions  . Penicillins Nausea And Vomiting, Rash and Other (See Comments)    fever    Review of Systems:  CONSTITUTIONAL: No fevers, chills, night sweats, or weight loss.  EYES: No visual changes or eye pain ENT: No hearing changes.  No history of nose bleeds.   RESPIRATORY: No cough, wheezing and shortness of breath.   CARDIOVASCULAR: Negative for chest pain, and palpitations.   GI: Negative for abdominal discomfort, blood in stools or black stools.  No recent change in bowel habits.   GU:  No history of incontinence.   MUSCLOSKELETAL: No history of joint pain or swelling.  No myalgias.   SKIN: Negative for lesions, rash, and itching.   ENDOCRINE: Negative for cold or heat intolerance, polydipsia or goiter.   PSYCH:  No depression or anxiety symptoms.   NEURO: As Above.   Vital Signs:  BP 100/60 mmHg  Pulse 71  Wt 106 lb (48.081 kg)  SpO2 98%  Neurological Exam: MENTAL STATUS including orientation to time, place, person, recent and remote memory, attention span and concentration, language, and fund of knowledge is normal.  Speech is mildly dysarthric and hypophonic.  CRANIAL NERVES: Pupils equal round and reactive to light.  Normal conjugate, extra-ocular eye movements in all directions of gaze. Mild bilateral ptosis.  No eyelid myotonia. Normal facial sensation. Typical mytonic facies with temporal wasting and transverse smile. Mild facial weakness as noted frontalis and oribicularis oculi 5-/5, unable to keep air in cheeks, oribicularis oris 4/5.  Palate elevates symmetrically.  Tongue is midline.  MOTOR:  Generalized loss of muscle bulk. Mild grip myotonia bilaterally. No percussion myotonia. No fasciculations or abnormal movements.  Tone is normal.  Neck flexion is 4/5.  Right Upper Extremity:    Left Upper Extremity:    Deltoid  4/5   Deltoid  4/5   Biceps  4/5   Biceps  4/5   Triceps  4/5   Triceps  4/5   Wrist  extensors  4/5   Wrist extensors  4/5   Wrist flexors  4/5   Wrist flexors  4/5   Finger extensors  4/5   Finger extensors  4/5   Finger flexors  4/5   Finger flexors  4/5   Dorsal interossei  4/5   Dorsal interossei  4/5   Abductor pollicis  4/5   Abductor pollicis  4/5   Tone (Ashworth scale)  0  Tone (Ashworth scale)  0   Right Lower Extremity:    Left Lower Extremity:    Hip flexors  3+/5   Hip flexors  3+/5   Hip extensors  4-/5   Hip extensors  4/5   Knee flexors  4-/5   Knee flexors  4/5   Knee extensors  4-/5   Knee extensors  4/5   Dorsiflexors  4-/5   Dorsiflexors  4/5   Plantarflexors  4-/5   Plantarflexors  4/5   Toe extensors  4-/5   Toe extensors  4/5  Toe flexors  4-/5   Toe flexors  4/5   Tone (Ashworth scale)  0  Tone (Ashworth scale)  0   MSRs:  Reflexes are 2+/4 throughout, except 1+ bilateral Achilles.  COORDINATION/GAIT:   Gait is slow, wide-based and stable, assisted with bilateral AFO.    Data: Echocardiogram 10/08/2014:  Normal   Lab Results  Component Value Date   HGBA1C 5.6 08/26/2014   Lab Results  Component Value Date   TSH 0.472 08/26/2014   Lab Results  Component Value Date   EZMOQHUT65 465 10/16/2014   Lab Results  Component Value Date   FOLATE 5.4 10/16/2014     IMPRESSION/PLAN: 1.  Myotonic dystrophy type I, genetically confirmed and with strong family history.  Since her last visit there has been an interval worsening in her right > left leg strength.  She is wearing bilateral AFO which helps her mobility, but she remains significantly weak, especially in her lower extremities.  She lives with her aunt who I also see for myotonic dystrophy and are trying to rearrange her bedroom on the first floor, because stairs are becoming more difficult.  I will send referral to home PT/OT.  She does not seem to have any other complications of myotonic dystrophy such as cardiac disease, cataracts, and diabetes at this time, but I stressed the  importance of annual surveillance.     2.  Myalgia and fatigue Check TSH, vitamin B12, HbA1c Start Cymbalta 30mg  daily Encouraged to drink 2-3 cans of ensure daily  3.  Chronic daily headaches - well controlled on topiramate, but since we are starting cymbalta, will see if we can manage headaches on Cymbalta alone.  Stop topiramate.    Return to clinic in 3 months   The duration of this appointment visit was 30 minutes of face-to-face time with the patient.  Greater than 50% of this time was spent in counseling, explanation of diagnosis, planning of further management, and coordination of care.   Thank you for allowing me to participate in patient's care.  If I can answer any additional questions, I would be pleased to do so.    Sincerely,    Donika K. Posey Pronto, DO

## 2015-07-05 NOTE — Telephone Encounter (Signed)
Called patient and she is aware her prescription for shingles vaccine it at the front desk She will stop in and pick it up

## 2015-07-06 ENCOUNTER — Other Ambulatory Visit: Payer: Self-pay | Admitting: *Deleted

## 2015-07-06 DIAGNOSIS — G7111 Myotonic muscular dystrophy: Secondary | ICD-10-CM

## 2015-07-06 DIAGNOSIS — M797 Fibromyalgia: Secondary | ICD-10-CM

## 2015-07-06 DIAGNOSIS — R5383 Other fatigue: Secondary | ICD-10-CM

## 2015-07-06 DIAGNOSIS — R5381 Other malaise: Secondary | ICD-10-CM

## 2015-07-06 DIAGNOSIS — Z131 Encounter for screening for diabetes mellitus: Secondary | ICD-10-CM

## 2015-07-09 ENCOUNTER — Other Ambulatory Visit (INDEPENDENT_AMBULATORY_CARE_PROVIDER_SITE_OTHER): Payer: Medicare Other

## 2015-07-09 DIAGNOSIS — G7111 Myotonic muscular dystrophy: Secondary | ICD-10-CM | POA: Diagnosis not present

## 2015-07-09 DIAGNOSIS — Z131 Encounter for screening for diabetes mellitus: Secondary | ICD-10-CM

## 2015-07-09 DIAGNOSIS — R5381 Other malaise: Secondary | ICD-10-CM

## 2015-07-09 DIAGNOSIS — R5383 Other fatigue: Secondary | ICD-10-CM

## 2015-07-09 DIAGNOSIS — M797 Fibromyalgia: Secondary | ICD-10-CM | POA: Diagnosis not present

## 2015-07-09 LAB — VITAMIN B12: VITAMIN B 12: 419 pg/mL (ref 211–911)

## 2015-07-09 LAB — HEMOGLOBIN A1C: Hgb A1c MFr Bld: 5.3 % (ref 4.6–6.5)

## 2015-07-09 LAB — TSH: TSH: 0.41 u[IU]/mL (ref 0.35–4.50)

## 2015-08-10 ENCOUNTER — Telehealth: Payer: Self-pay | Admitting: Neurology

## 2015-08-10 ENCOUNTER — Telehealth: Payer: Self-pay | Admitting: Internal Medicine

## 2015-08-10 NOTE — Telephone Encounter (Signed)
Belenda Cruise from [facility] called requesting verbal order for an OT order from PCP because the pt. Was referred from Neurologist and she called the Neurologist for additional OT and she is out of town for the whole month. She would like to know if PCP can give her authorization for additional OT. Please f/u

## 2015-08-10 NOTE — Telephone Encounter (Signed)
Katherine/[facility]/ called to get a verbal order for more home care visits/ call back @ [phone]

## 2015-08-11 ENCOUNTER — Telehealth: Payer: Self-pay

## 2015-08-11 NOTE — Telephone Encounter (Signed)
Returned call to Belenda Cruise from [facility] @ [phone] in  Reference to this patient Belenda Cruise not available Message left on voice mail to return our call

## 2015-08-11 NOTE — Telephone Encounter (Signed)
Nurse called patient, patient verified date of birth. Nurse called patient to make patient aware of prescription written on 07/05/15 for zostavax vaccine has not been picked up. Per patient, prescription is no longer needed. Nurse will put prescription in shred bin.

## 2015-08-12 NOTE — Telephone Encounter (Signed)
V/O given ok to continue OT.

## 2015-08-16 ENCOUNTER — Other Ambulatory Visit: Payer: Self-pay | Admitting: *Deleted

## 2015-08-16 MED ORDER — DULOXETINE HCL 30 MG PO CPEP: 30.0000 mg | ORAL_CAPSULE | Freq: Every day | ORAL | Status: DC

## 2015-09-05 HISTORY — PX: EYE SURGERY: SHX253

## 2015-09-13 DIAGNOSIS — R0602 Shortness of breath: Secondary | ICD-10-CM | POA: Diagnosis not present

## 2015-09-13 DIAGNOSIS — F419 Anxiety disorder, unspecified: Secondary | ICD-10-CM | POA: Diagnosis not present

## 2015-09-13 DIAGNOSIS — D573 Sickle-cell trait: Secondary | ICD-10-CM | POA: Diagnosis not present

## 2015-09-13 DIAGNOSIS — G7111 Myotonic muscular dystrophy: Secondary | ICD-10-CM | POA: Diagnosis not present

## 2015-09-13 DIAGNOSIS — Z836 Family history of other diseases of the respiratory system: Secondary | ICD-10-CM | POA: Diagnosis not present

## 2015-09-13 DIAGNOSIS — Z79899 Other long term (current) drug therapy: Secondary | ICD-10-CM | POA: Diagnosis not present

## 2015-09-13 DIAGNOSIS — H269 Unspecified cataract: Secondary | ICD-10-CM | POA: Diagnosis not present

## 2015-09-13 DIAGNOSIS — H04552 Acquired stenosis of left nasolacrimal duct: Secondary | ICD-10-CM | POA: Diagnosis not present

## 2015-09-13 DIAGNOSIS — F17210 Nicotine dependence, cigarettes, uncomplicated: Secondary | ICD-10-CM | POA: Diagnosis not present

## 2015-09-13 DIAGNOSIS — H04222 Epiphora due to insufficient drainage, left lacrimal gland: Secondary | ICD-10-CM | POA: Diagnosis not present

## 2015-09-13 DIAGNOSIS — I452 Bifascicular block: Secondary | ICD-10-CM | POA: Diagnosis not present

## 2015-09-13 DIAGNOSIS — Z880 Allergy status to penicillin: Secondary | ICD-10-CM | POA: Diagnosis not present

## 2015-09-13 DIAGNOSIS — R51 Headache: Secondary | ICD-10-CM | POA: Diagnosis not present

## 2015-09-21 DIAGNOSIS — Z48810 Encounter for surgical aftercare following surgery on the sense organs: Secondary | ICD-10-CM | POA: Diagnosis not present

## 2015-09-21 DIAGNOSIS — F17210 Nicotine dependence, cigarettes, uncomplicated: Secondary | ICD-10-CM | POA: Diagnosis not present

## 2015-09-21 DIAGNOSIS — Z880 Allergy status to penicillin: Secondary | ICD-10-CM | POA: Diagnosis not present

## 2015-10-02 ENCOUNTER — Other Ambulatory Visit: Payer: Self-pay | Admitting: Neurology

## 2015-10-04 ENCOUNTER — Other Ambulatory Visit: Payer: Self-pay | Admitting: *Deleted

## 2015-10-04 MED ORDER — DULOXETINE HCL 30 MG PO CPEP: 30.0000 mg | ORAL_CAPSULE | Freq: Every day | ORAL | Status: DC

## 2015-10-04 NOTE — Telephone Encounter (Signed)
Rx sent 

## 2015-10-25 ENCOUNTER — Ambulatory Visit (HOSPITAL_COMMUNITY): Payer: Medicare Other

## 2015-10-26 DIAGNOSIS — Z880 Allergy status to penicillin: Secondary | ICD-10-CM | POA: Diagnosis not present

## 2015-10-26 DIAGNOSIS — Z79891 Long term (current) use of opiate analgesic: Secondary | ICD-10-CM | POA: Diagnosis not present

## 2015-10-26 DIAGNOSIS — F17210 Nicotine dependence, cigarettes, uncomplicated: Secondary | ICD-10-CM | POA: Diagnosis not present

## 2015-10-26 DIAGNOSIS — Z48810 Encounter for surgical aftercare following surgery on the sense organs: Secondary | ICD-10-CM | POA: Diagnosis not present

## 2015-10-29 ENCOUNTER — Encounter: Payer: Self-pay | Admitting: Neurology

## 2015-10-29 ENCOUNTER — Ambulatory Visit (INDEPENDENT_AMBULATORY_CARE_PROVIDER_SITE_OTHER): Payer: Medicare Other | Admitting: Neurology

## 2015-10-29 VITALS — BP 119/66 | HR 79 | Ht 63.0 in | Wt 98.4 lb

## 2015-10-29 DIAGNOSIS — M797 Fibromyalgia: Secondary | ICD-10-CM

## 2015-10-29 DIAGNOSIS — R51 Headache: Secondary | ICD-10-CM | POA: Diagnosis not present

## 2015-10-29 DIAGNOSIS — G7111 Myotonic muscular dystrophy: Secondary | ICD-10-CM

## 2015-10-29 DIAGNOSIS — R519 Headache, unspecified: Secondary | ICD-10-CM

## 2015-10-29 MED ORDER — DULOXETINE HCL 30 MG PO CPEP: 30.0000 mg | ORAL_CAPSULE | Freq: Every day | ORAL | Status: DC

## 2015-10-29 NOTE — Patient Instructions (Signed)
1.  Continue cymbalta 60mg  daily 2.  Encouraged to stop smoking 3.  Return to clinic in 18-months

## 2015-10-29 NOTE — Progress Notes (Signed)
Follow-up Visit   Date: 10/29/2015    Cynthia Underwood MRN: [id number] DOB: 07-26-1971   Interim History: Cynthia Underwood is a 45 y.o. right-handed African American female with myotonic dystrophy type I and hyperlipidemia returning to the clinic for follow-up of myotonic dystrophy type I.  The patient was accompanied to the clinic by self.  History of present illness: Starting around the age of 61, she started developed lock jaw and stiffness of the hands but thought that it was normal so did not make much of it. Over the years, she developed worsening stiffness of her fingers and hands. Because her maternal aunt, who is also a patient of mine, had known myotonic dystrophy, she ultimately was genetically tested at the age of 68 which confirmed the diagnosis of myotonic dystrophy type 1. She has a strong family history of DM1 including maternal aunt, cousins x 2, and younger sister. She does not have any children and has no future plans for pregnancy.  Symptoms were relatively stable until her mid-30s and then started developing fatigue, weakness, daytime sleepiness, and shortness of breath with exertion. She is seeing Dr. Radford Pax in cardiology for surveillance and her recent echocardiogram was within normal limits. Cardiac MRI is pending. She reports to falling three times since August 2015. She walks independently but was told previously told to use leg braces, but does not use it due to discomfort.  Over the past few years, she noticed intermittent difficulty swallowing liquids. She underwent barium swallow last year which reportedly showed signs of aspiration and she was recommended to use a straw and use chin tuck position. She does not have a good appetite and endorses weight loss. She drink 1 bottle of ensure daily, if that.   She was seeing several neurologists over the years at [facility] in [place]. She is not working and has been on disability since  2010.  UPDATE 04/01/2015:  Overall, there has been worsening weakness of her legs, especially the right.  Sometimes, she has to lift her left leg out of the car because it is so weak.  She feels that she no longer has any good days, they are either "bad or worse".  She does have spells of difficulty swallowing, but over the years has learned out to best minimize this.  Her weight has been relatively stable since her last visit (2lb weight loss).  She did start adding 2-3 cans of ensure to her diet as recommended.  She is sleeping on 2 pillows for some time and denies any shortness of breath.  Muscle stiffness and pain is not has bad currently.   Her new complaint is headaches with biparietal throbbing pain, which lasts several hours, occuring about 3 times per week.  When the pain is severe, she simply rests and sleeps.  She takes ibuprofen which sometimes helps. She reports worsening pain with loud noises.  No photophobia or nausea.  UPDATE 07/05/2015:  She no longer has headaches since starting topiramate 25mg .  She reports having muscle aches of the legs, especially when walking and reports lack of energy. She was recently started on trazadone which is helping her sleep.  She does complain of lack of appetite.  Her primary care provider gave ensure to help. No interval falls.  Climbing stairs is becoming more and more diffficult and her aunt is planning on moving her bedroom to the first floor, but unfortunately there is no shower.  They have discussed alternative options, such as  moving into a one-story home, but resources are not available at this time.  Her mood is down and she is planning on seeing psychiatrist soon.  UPDATE 10/29/2015:  She reports having improved mood, myalgias, and headaches with Cymbalta.  She reports being ill for 3-days and had reduced PO intake and lost some weight.  She is taking 3 cans of ensure.  She had one mechanical fall, try to jump over a ditch, but did not sustain  injuries. She is able to climb stairs, but tries to avoid it.  She completed home PT/OT and reports marked improvement in strength, no longer wearing AFOs.  Medications:  Current Outpatient Prescriptions on File Prior to Visit  Medication Sig Dispense Refill  . DULoxetine (CYMBALTA) 30 MG capsule TAKE 1 CAPSULE (30 MG TOTAL) BY MOUTH DAILY. 30 capsule 1   No current facility-administered medications on file prior to visit.    Allergies:  Allergies  Allergen Reactions  . Penicillins Nausea And Vomiting, Rash and Other (See Comments)    fever    Review of Systems:  CONSTITUTIONAL: No fevers, chills, night sweats, or weight loss.  EYES: No visual changes or eye pain ENT: No hearing changes.  No history of nose bleeds.   RESPIRATORY: No cough, wheezing and shortness of breath.   CARDIOVASCULAR: Negative for chest pain, and palpitations.   GI: Negative for abdominal discomfort, blood in stools or black stools.  No recent change in bowel habits.   GU:  No history of incontinence.   MUSCLOSKELETAL: No history of joint pain or swelling.  No myalgias.   SKIN: Negative for lesions, rash, and itching.   ENDOCRINE: Negative for cold or heat intolerance, polydipsia or goiter.   PSYCH:  No depression or anxiety symptoms.   NEURO: As Above.   Vital Signs:  BP 119/66 mmHg  Pulse 79  Ht 5\' 3"  (1.6 m)  Wt 98 lb 7 oz (44.651 kg)  BMI 17.44 kg/m2  SpO2 94%  Neurological Exam: MENTAL STATUS including orientation to time, place, person, recent and remote memory, attention span and concentration, language, and fund of knowledge is normal.  Speech is mildly dysarthric and hypophonic.  CRANIAL NERVES: Pupils equal round and reactive to light.  Normal conjugate, extra-ocular eye movements in all directions of gaze. Mild bilateral ptosis.  No eyelid myotonia. Typical mytonic facies with temporal wasting and transverse smile. Mild facial weakness as noted frontalis and oribicularis oculi 5-/5, unable  to keep air in cheeks, oribicularis oris 4/5.  Palate elevates symmetrically.  Tongue is midline.  MOTOR:  Generalized loss of muscle bulk. Mild grip myotonia bilaterally. No percussion myotonia. No fasciculations or abnormal movements.  Tone is normal.  Neck flexion is 5-/5.  Right Upper Extremity:    Left Upper Extremity:    Deltoid  5/5   Deltoid  5/5   Biceps  5/5   Biceps  5/5   Triceps  5-/5   Triceps  5-/5   Wrist extensors  5/5   Wrist extensors  5/5   Wrist flexors  5/5   Wrist flexors  5/5   Finger extensors  4/5   Finger extensors  4/5   Finger flexors  4/5   Finger flexors  4/5   Dorsal interossei  4/5   Dorsal interossei  4/5   Abductor pollicis  4/5   Abductor pollicis  4/5   Tone (Ashworth scale)  0  Tone (Ashworth scale)  0   Right Lower Extremity:  Left Lower Extremity:    Hip flexors  4+/5   Hip flexors  4+/5   Hip extensors  5-/5   Hip extensors  5-/5   Knee flexors  5-/5   Knee flexors  5-/5   Knee extensors  5-/5   Knee extensors  5-/5   Dorsiflexors  5-/5   Dorsiflexors  5-/5   Plantarflexors  5-/5   Plantarflexors  5-/5   Toe extensors  5-/5   Toe extensors  5-/5   Toe flexors  5-/5   Toe flexors  5-/5   Tone (Ashworth scale)  0  Tone (Ashworth scale)  0   MSRs:  Reflexes are 2+/4 throughout, except 1+ bilateral Achilles.  COORDINATION/GAIT:   Gait is normal.  She is able to walk on heels and toes.  She easily gets up off a low chair without using arms to push off.    Data: Echocardiogram 10/08/2014:  Normal   Lab Results  Component Value Date   HGBA1C 5.3 07/09/2015   Lab Results  Component Value Date   TSH 0.41 07/09/2015   Lab Results  Component Value Date   VITAMINB12 419 07/09/2015   Lab Results  Component Value Date   FOLATE 5.4 10/16/2014    IMPRESSION/PLAN: 1.  Myotonic dystrophy type I, genetically confirmed and with strong family history.  She had marked improvement in motor strength since her last visit and attributed to physical  therapy.  She is now wearing wearing AFOs long distances only.  Continue home exercises She does not seem to have any other complications of myotonic dystrophy such as cardiac disease, cataracts, and diabetes at this time, but I stressed the importance of annual surveillance.  She has a echo scheduled in March and is followed by Dr. Radford Pax.    2.  Myalgia and fatigue, improved! Continue Cymbalta 30mg  daily Encouraged to drink 2-3 cans of ensure daily  3.  Chronic daily headaches, also improved - well controlled on Cymbalta  4.  Tobacco cessation encouraged  Return to clinic in 6 months   The duration of this appointment visit was 25 minutes of face-to-face time with the patient.  Greater than 50% of this time was spent in counseling, explanation of diagnosis, planning of further management, and coordination of care.   Thank you for allowing me to participate in patient's care.  If I can answer any additional questions, I would be pleased to do so.    Sincerely,    Flor Houdeshell K. Posey Pronto, DO

## 2015-11-04 ENCOUNTER — Encounter: Payer: Self-pay | Admitting: Internal Medicine

## 2015-11-04 ENCOUNTER — Ambulatory Visit: Payer: Medicare Other | Attending: Internal Medicine | Admitting: Internal Medicine

## 2015-11-04 VITALS — BP 113/74 | HR 69 | Temp 98.0°F | Resp 16 | Ht 63.0 in | Wt 98.2 lb

## 2015-11-04 DIAGNOSIS — R6889 Other general symptoms and signs: Secondary | ICD-10-CM

## 2015-11-04 DIAGNOSIS — F329 Major depressive disorder, single episode, unspecified: Secondary | ICD-10-CM | POA: Diagnosis not present

## 2015-11-04 DIAGNOSIS — H6123 Impacted cerumen, bilateral: Secondary | ICD-10-CM | POA: Diagnosis not present

## 2015-11-04 DIAGNOSIS — R51 Headache: Secondary | ICD-10-CM | POA: Insufficient documentation

## 2015-11-04 DIAGNOSIS — Z79899 Other long term (current) drug therapy: Secondary | ICD-10-CM | POA: Insufficient documentation

## 2015-11-04 DIAGNOSIS — F17210 Nicotine dependence, cigarettes, uncomplicated: Secondary | ICD-10-CM | POA: Diagnosis not present

## 2015-11-04 DIAGNOSIS — G7111 Myotonic muscular dystrophy: Secondary | ICD-10-CM | POA: Insufficient documentation

## 2015-11-04 DIAGNOSIS — I4510 Unspecified right bundle-branch block: Secondary | ICD-10-CM | POA: Insufficient documentation

## 2015-11-04 DIAGNOSIS — R112 Nausea with vomiting, unspecified: Secondary | ICD-10-CM | POA: Diagnosis not present

## 2015-11-04 DIAGNOSIS — Z880 Allergy status to penicillin: Secondary | ICD-10-CM | POA: Insufficient documentation

## 2015-11-04 DIAGNOSIS — R05 Cough: Secondary | ICD-10-CM | POA: Insufficient documentation

## 2015-11-04 MED ORDER — ONDANSETRON 4 MG PO TBDP: 4.0000 mg | ORAL_TABLET | Freq: Three times a day (TID) | ORAL | Status: DC | PRN

## 2015-11-04 NOTE — Progress Notes (Signed)
Patient complains of decreased  Appetite vomiting Nausea, coughing sneezing and chills that started this past Saturday Denies any fever

## 2015-11-04 NOTE — Progress Notes (Signed)
Patient ID: Cynthia Underwood, female   DOB: 1970/12/28, 45 y.o.   MRN: [id number]  CC: "I'm feeling bad."  HPI: Cynthia Underwood is a 45 y.o. female here today for "feeling bad."  Patient has past medical history of myotonic dystrophy, RBBB, anemia and depression.  Patient reports decreased appetite, coughing, headache, nausea, vomiting, fatigue (worse than usual) and runny nose x4 days.  Patient reports not being able to eat x3 days due to vomiting.  Nothing makes is better or worse. Patient has not tried anything to help.    Allergies  Allergen Reactions  . Penicillins Nausea And Vomiting, Rash and Other (See Comments)    fever   Past Medical History  Diagnosis Date  . Myotonic dystrophy, type 1 ([place])     Genetically confirmed  . Anemia   . Excess ear wax   . Watery eyes     Left  . Headache   . Weakness of both legs   . Depression   . Bifascicular block 10/02/2014  . 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   Current Outpatient Prescriptions on File Prior to Visit  Medication Sig Dispense Refill  . DULoxetine (CYMBALTA) 30 MG capsule Take 1 capsule (30 mg total) by mouth daily. 90 capsule 3  . traZODone (DESYREL) 50 MG tablet Take 50 mg by mouth at bedtime. 1/2 tablet qhs     No current facility-administered medications on file prior to visit.   Family History  Problem Relation Age of Onset  . Cancer Father 9    Deceased  . Heart disease Father   . Diabetes Maternal Uncle   . Heart disease Maternal Uncle   . Heart disease Paternal Grandmother   . Diabetes Paternal Grandmother   . Hypertension Paternal Grandmother   . COPD Mother   . Neuromuscular disorder Maternal Aunt     Myotonic dystrophy  . Neuromuscular disorder Cousin     Myotonic dystrophy  . Neuromuscular disorder Sister     Myotonic dystrophy   Social History   Social History  . Marital Status: Single    Spouse Name: N/A  . Number of Children: 0  . Years of  Education: 1.5 Collge   Occupational History  . Unemployed Other   Social History Main Topics  . Smoking status: Current Every Day Smoker -- 0.25 packs/day for 26 years    Types: Cigarettes  . Smokeless tobacco: Never Used  . Alcohol Use: 0.0 oz/week    0 Standard drinks or equivalent per week     Comment: Rarely only holidays  . Drug Use: Yes     Comment: Marijuana  . Sexual Activity: Not Currently    Birth Control/ Protection: None   Other Topics Concern  . Not on file   Social History Narrative   Patient lives at home with her aunt.   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  She moved to [facility] in August 2015.   She has been on disability since 2010.   Education: 1 1/2 years of college.       Review of Systems: Constitutional: fever, chills, diaphoresis, fatigue, appetite change and activity change HENT: rhinorrhea, headaches Negative for ear pain, nosebleeds, congestion, facial swelling, neck pain, neck stiffness and ear discharge.  Eyes: Negative for pain, discharge, redness, itching and visual disturbance. Respiratory: cough Negative for choking, chest tightness, shortness of breath, wheezing and stridor.  Cardiovascular: Negative for chest pain, palpitations and leg swelling.  Objective:   Filed Vitals:   11/04/15 1638  BP: 113/74  Pulse: 69  Temp: 98 F (36.7 C)  Resp: 16    Physical Exam: Constitutional: Patient appears sick and fatigued. No distress. HENT: Normocephalic, atraumatic, External right and left ear normal. Cerumen impaction bilateral ears. Oropharynx is clear and moist.  Eyes: Conjunctivae and EOM are normal. no scleral icterus. Neck: Normal ROM. Neck supple. No JVD. No tracheal deviation. No thyromegaly. CVS: RRR, S1/S2 +, no murmurs, no gallops, no carotid bruit.  Pulmonary: Effort and breath sounds normal, no stridor, rhonchi, wheezes, rales.  Musculoskeletal: Normal range of motion. No edema and no tenderness.   Lymphadenopathy: No lymphadenopathy noted, cervical, inguinal or axillary Skin: Skin is warm and dry. No rash noted. Not diaphoretic. No erythema. No pallor.   Lab Results  Component Value Date   WBC 5.0 05/16/2014   HGB 12.8 05/16/2014   HCT 39.3 05/16/2014   MCV 95.4 05/16/2014   PLT 307 05/16/2014   Lab Results  Component Value Date   CREATININE 0.70 10/22/2014   BUN 15 05/16/2014   NA 140 05/16/2014   K 4.5 05/16/2014   CL 102 05/16/2014   CO2 27 05/16/2014    Lab Results  Component Value Date   HGBA1C 5.3 07/09/2015   Lipid Panel     Component Value Date/Time   CHOL 205* 08/26/2014 1516   TRIG 102 08/26/2014 1516   HDL 59 08/26/2014 1516   CHOLHDL 3.5 08/26/2014 1516   VLDL 20 08/26/2014 1516   LDLCALC 126* 08/26/2014 1516       Assessment and plan:   Cynthia Underwood was seen today for cough.  Diagnoses and all orders for this visit:  Flu-like symptoms Symptom management addressed with patient. Return precautions addressed.   Nausea and vomiting, intractability of vomiting not specified, unspecified vomiting type -     Begin ondansetron (ZOFRAN ODT) 4 MG disintegrating tablet; Take 1 tablet (4 mg total) by mouth every 8 (eight) hours as needed for nausea or vomiting.  Patient instructed to drink plenty of fluids and rest.  Take medication as ordered to help with nausea.  Patient to return if symptoms worsen.  Cerumen impaction, bilateral Nurse irrigated bilateral ears. Unable to remove all wax---instructed her to get OTC Debrox and use 2 gtts each ear nightly for one week.      Return if symptoms worsen or fail to improve.   Cynthia Underwood, Student NP [facility] [phone] 11/04/2015, 5:27 PM

## 2015-11-04 NOTE — Patient Instructions (Signed)

## 2015-11-05 ENCOUNTER — Encounter: Payer: Self-pay | Admitting: Neurology

## 2015-11-11 ENCOUNTER — Ambulatory Visit (HOSPITAL_COMMUNITY): Payer: Medicare Other | Attending: Cardiovascular Disease

## 2015-11-11 ENCOUNTER — Other Ambulatory Visit: Payer: Self-pay

## 2015-11-11 DIAGNOSIS — G7111 Myotonic muscular dystrophy: Secondary | ICD-10-CM | POA: Diagnosis not present

## 2015-11-11 DIAGNOSIS — I452 Bifascicular block: Secondary | ICD-10-CM | POA: Insufficient documentation

## 2015-11-11 DIAGNOSIS — R0602 Shortness of breath: Secondary | ICD-10-CM | POA: Insufficient documentation

## 2015-11-11 DIAGNOSIS — I4510 Unspecified right bundle-branch block: Secondary | ICD-10-CM | POA: Insufficient documentation

## 2016-01-07 ENCOUNTER — Other Ambulatory Visit: Payer: Self-pay | Admitting: Internal Medicine

## 2016-01-07 DIAGNOSIS — N63 Unspecified lump in unspecified breast: Secondary | ICD-10-CM

## 2016-01-18 ENCOUNTER — Ambulatory Visit
Admission: RE | Admit: 2016-01-18 | Discharge: 2016-01-18 | Disposition: A | Discharge status: Home / Self Care | Payer: Medicare Other | Source: Ambulatory Visit | Attending: Internal Medicine | Admitting: Internal Medicine

## 2016-01-18 DIAGNOSIS — R922 Inconclusive mammogram: Secondary | ICD-10-CM | POA: Diagnosis not present

## 2016-01-18 DIAGNOSIS — N63 Unspecified lump in unspecified breast: Secondary | ICD-10-CM

## 2016-04-26 ENCOUNTER — Ambulatory Visit (INDEPENDENT_AMBULATORY_CARE_PROVIDER_SITE_OTHER): Payer: Medicare Other | Admitting: Neurology

## 2016-04-26 ENCOUNTER — Encounter: Payer: Self-pay | Admitting: Neurology

## 2016-04-26 VITALS — BP 100/60 | HR 64 | Ht 63.0 in | Wt 110.6 lb

## 2016-04-26 DIAGNOSIS — M6289 Other specified disorders of muscle: Secondary | ICD-10-CM | POA: Diagnosis not present

## 2016-04-26 DIAGNOSIS — G7111 Myotonic muscular dystrophy: Secondary | ICD-10-CM

## 2016-04-26 MED ORDER — TIZANIDINE HCL 2 MG PO TABS: 2.0000 mg | ORAL_TABLET | Freq: Every day | ORAL | 5 refills | Status: DC

## 2016-04-26 NOTE — Patient Instructions (Addendum)
1.  Start tizanidine 2mg  at bedtime 2.  Return to clinic in 6 months

## 2016-04-26 NOTE — Progress Notes (Signed)
Follow-up Visit   Date: 04/26/16    Cynthia Underwood MRN: [id number] DOB: April 26, 1971   Interim History: Cynthia Underwood is a 45 y.o. right-handed African American female with myotonic dystrophy type I and hyperlipidemia returning to the clinic for follow-up of myotonic dystrophy type I.  The patient was accompanied to the clinic by self.  History of present illness: Starting around the age of 74, she started having lock jaw and stiffness of the hands. Over the years, she developed worsening stiffness of her fingers and hands. Because her maternal aunt, who is also a patient of mine, had known myotonic dystrophy, she ultimately was genetically tested at the age of 80 which confirmed the diagnosis of myotonic dystrophy type 1. She has a strong family history of DM1 including maternal aunt, cousins x 2, and younger sister. She does not have any children and has no future plans for pregnancy.  Symptoms were relatively stable until her mid-30s and then started developing fatigue, weakness, daytime sleepiness, and shortness of breath with exertion. She is seeing Dr. Radford Pax in cardiology for surveillance. She walks independently but was told previously told to use leg braces.  Over the past few years, she noticed intermittent difficulty swallowing liquids. She underwent barium swallow which reportedly showed signs of aspiration and she was recommended to use a straw and use chin tuck position.   She was seeing several neurologists over the years at [facility] in [place]. She is not working and has been on disability since 2010.  She established care with me in February 2016.  She had significant leg weakness where she had to lift her left leg out of the car because it is so weak.  She feels that she no longer has any good days, they are either "bad or worse".  She also had new biparietal throbbing pain and responded to topiramate 25mg  daily.  In fall of 2016, she had to move  her bedroom to the [place] because of difficulty climbing stairs.  Since completing home PT/OT, her muscle strength has improved and sometimes, she is able to walk without AFO.   She was started in Cymbalta 30mg  for mood, fatigue and myalgias which significantly helped.    UPDATE 04/26/2016: She stopped taking Cymbalta in June because mood improved.  She continues to have myalgias, but is it manageable off medications. She usually drinks 2 cans of ensure daily and has gained 12lb since her last visit in February.  She has not had any interval falls.  She has now moved her bedroom back upstairs and is doing better managing her stairs.  Her biggest concern is increased frequency of muscle stiffness and cramping of the hands.    Medications:  Current Outpatient Prescriptions on File Prior to Visit  Medication Sig Dispense Refill  . DULoxetine (CYMBALTA) 30 MG capsule Take 1 capsule (30 mg total) by mouth daily. 90 capsule 3  . ondansetron (ZOFRAN ODT) 4 MG disintegrating tablet Take 1 tablet (4 mg total) by mouth every 8 (eight) hours as needed for nausea or vomiting. 30 tablet 0  . traZODone (DESYREL) 50 MG tablet Take 50 mg by mouth at bedtime. 1/2 tablet qhs     No current facility-administered medications on file prior to visit.     Allergies:  Allergies  Allergen Reactions  . Penicillins Nausea And Vomiting, Rash and Other (See Comments)    fever    Review of Systems:  CONSTITUTIONAL: No fevers, chills, night sweats, or  weight loss.  EYES: No visual changes or eye pain ENT: No hearing changes.  No history of nose bleeds.   RESPIRATORY: No cough, wheezing and shortness of breath.   CARDIOVASCULAR: Negative for chest pain, and palpitations.   GI: Negative for abdominal discomfort, blood in stools or black stools.  No recent change in bowel habits.   GU:  No history of incontinence.   MUSCLOSKELETAL: No history of joint pain or swelling.  No myalgias.   SKIN: Negative for lesions, rash,  and itching.   ENDOCRINE: Negative for cold or heat intolerance, polydipsia or goiter.   PSYCH:  No depression or anxiety symptoms.   NEURO: As Above.   Vital Signs:  BP 100/60   Pulse 64   Ht 5\' 3"  (1.6 m)   Wt 110 lb 9 oz (50.2 kg)   SpO2 96%   BMI 19.59 kg/m   Neurological Exam: MENTAL STATUS including orientation to time, place, person, recent and remote memory, attention span and concentration, language, and fund of knowledge is normal.  Speech is mildly dysarthric and hypophonic.  CRANIAL NERVES: Pupils equal round and reactive to light.  Normal conjugate, extra-ocular eye movements in all directions of gaze. Mild bilateral ptosis.  No eyelid myotonia. Typical mytonic facies with temporal wasting and transverse smile. Mild facial weakness as noted frontalis and oribicularis oculi 5-/5, unable to keep air in cheeks, oribicularis oris 4/5.  Palate elevates symmetrically.  Tongue is midline.  MOTOR:  Generalized loss of muscle bulk. Mild grip myotonia bilaterally. No percussion myotonia. No fasciculations or abnormal movements.  Tone is normal.  Neck flexion is 5-/5.  Right Upper Extremity:    Left Upper Extremity:    Deltoid  5/5   Deltoid  5/5   Biceps  5/5   Biceps  5/5   Triceps  5-/5   Triceps  5-/5   Wrist extensors  5/5   Wrist extensors  5/5   Wrist flexors  5/5   Wrist flexors  5/5   Finger extensors  4/5   Finger extensors  4/5   Finger flexors  4/5   Finger flexors  4/5   Dorsal interossei  4/5   Dorsal interossei  4/5   Abductor pollicis  4/5   Abductor pollicis  4/5   Tone (Ashworth scale)  0  Tone (Ashworth scale)  0   Right Lower Extremity:    Left Lower Extremity:    Hip flexors  5-/5   Hip flexors  5-/5   Hip extensors  5/5   Hip extensors  5/5   Knee flexors  5/5   Knee flexors  5/5   Knee extensors  5/5   Knee extensors  5/5   Dorsiflexors  5-/5   Dorsiflexors  5-/5   Plantarflexors  5-/5   Plantarflexors  5-/5   Toe extensors  5-/5   Toe extensors   5-/5   Toe flexors  5-/5   Toe flexors  5-/5   Tone (Ashworth scale)  0  Tone (Ashworth scale)  0    COORDINATION/GAIT:   Gait is normal.  She is able to walk on heels and toes.  She easily gets up off a low chair without using arms to push off.    Data: Echocardiogram 10/08/2014:  Normal   Lab Results  Component Value Date   HGBA1C 5.3 07/09/2015   Lab Results  Component Value Date   TSH 0.41 07/09/2015   Lab Results  Component Value Date  [id number] 07/09/2015   Lab Results  Component Value Date   FOLATE 5.4 10/16/2014    IMPRESSION/PLAN: 1.  Myotonic dystrophy type I, genetically confirmed and with strong family history.  Her proximal leg strength is improved today and likely due to better nutrition and weight gain as well as physical therapy.  She is wears AFOs long distances only.  She does not seem to have any other complications of myotonic dystrophy such as cardiac disease, cataracts, and diabetes at this time; continue annual surveillance.   Start tizanidine 2mg  at bedtime for myotonia.  Consider mexiletine going forward.  Check HbA1c at next visit   2.  Myalgia and fatigue, stable.  Previously on Cymbalta 30mg  daily  3.  Chronic daily headaches, resolved  4.  Tobacco cessation encouraged  Return to clinic in 6 months   The duration of this appointment visit was 20 minutes of face-to-face time with the patient.  Greater than 50% of this time was spent in counseling, explanation of diagnosis, planning of further management, and coordination of care.   Thank you for allowing me to participate in patient's care.  If I can answer any additional questions, I would be pleased to do so.    Sincerely,    Jhovany Weidinger K. Posey Pronto, DO

## 2016-04-27 ENCOUNTER — Ambulatory Visit: Payer: Medicare Other | Admitting: Neurology

## 2016-05-21 DIAGNOSIS — R002 Palpitations: Secondary | ICD-10-CM | POA: Insufficient documentation

## 2016-05-21 NOTE — Progress Notes (Signed)
Cardiology Office Note    Date:  05/23/2016   ID:  Underwood, Cynthia March 19, 1971, MRN [id number]  PCP:  Lance Bosch, NP  Cardiologist:  Fransico Him, MD   Chief Complaint  Patient presents with  . Follow-up    sob, rbbb    History of Present Illness:  Cynthia Underwood is a 45 y.o. female who presents for followup of palpitations. She has a history of myotonic dystrophy Type I since age 67 with intermittent jaw locking, finger stiffness and difficulty relaxing muscles.She was diagnosed by genetic testing.  Dictation #1 [id number]  [id number] She has a family history of myotonic dystrophy and sarcoidosis with her Dad having a cardiac transplant.  She has a history of abnormal cardiac MRI showing patch gad uptake in the apex and mid to basal anterior wal. She is followed with Neurology. She denies any chest pain, syncope, dizziness or palpitations.  She has chronic DOE and fatigue going up stairs related to her myotonic dystrophy that is stable and has not changed since I saw her last.  She has occasional swelling in her feet that is stable.    Past Medical History:  Diagnosis Date  . Anemia   . Bifascicular block 10/02/2014  . Depression   . Excess ear wax   . Headache   . 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 Watery eyes    Left  . Weakness of both legs     Past Surgical History:  Procedure Laterality Date  . BIOPSY BREAST Right     Current Medications: Outpatient Medications Prior to Visit  Medication Sig Dispense Refill  . tiZANidine (ZANAFLEX) 2 MG tablet Take 1 tablet (2 mg total) by mouth at bedtime. 30 tablet 5  . [facility]S SALINE NOSE SPRAY 0.65 % nasal spray INSTILL 2 SPRAYS INTO EACH NOSTRIL 2 TIMES DAILY FOR 10 DAYS  1  . DULoxetine (CYMBALTA) 30 MG capsule Take 1 capsule (30 mg total) by mouth daily. (Patient not taking: Reported on 05/23/2016)  90 capsule 3  . ondansetron (ZOFRAN ODT) 4 MG disintegrating tablet Take 1 tablet (4 mg total) by mouth every 8 (eight) hours as needed for nausea or vomiting. (Patient not taking: Reported on 05/23/2016) 30 tablet 0  . traZODone (DESYREL) 50 MG tablet Take 50 mg by mouth at bedtime. 1/2 tablet qhs     No facility-administered medications prior to visit.      Allergies:   Penicillins   Social History   Social History  . Marital status: Single    Spouse name: N/A  . Number of children: 0  . Years of education: 1.5 Coll[employer]   Occupational History  . Unemployed Other   Social History Main Topics  . Smoking status: Current Every Day Smoker    Packs/day: 0.25    Years: 26.00    Types: Cigarettes  . Smokeless tobacco: Never Used  . Alcohol use 0.0 oz/week     Comment: Rarely only holidays  . Drug use:      Comment: Marijuana  . Sexual activity: Not Currently    Birth control/ protection: None   Other Topics Concern  . Not on file   Social History Narrative   Patient lives at home with her aunt.   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  She moved to [facility] in August 2015.  She has been on disability since 2010.   Education: 1 1/2 years of college.        Family History:  The patient's family history includes COPD in her mother; Cancer (age of onset: 63) in her father; Diabetes in her maternal uncle and paternal grandmother; Heart disease in her father, maternal uncle, and paternal grandmother; Hypertension in her paternal grandmother; Neuromuscular disorder in her cousin, maternal aunt, and sister.   ROS:   Please see the history of present illness.    ROS All other systems reviewed and are negative.  No flowsheet data found.     PHYSICAL EXAM:   VS:  BP 122/66 (BP Location: Right Arm, Patient Position: Sitting, Cuff Size: Normal)   Pulse (!) 54   Ht 5\' 3"  (1.6 m)   Wt 110 lb (49.9 kg)   BMI 19.49 kg/m    GEN: Well nourished, well developed,  in no acute distress  HEENT: normal  Neck: no JVD, carotid bruits, or masses Cardiac: RRR; no  rubs, or gallops,no edema.  Intact distal pulses bilaterally. 1/6 SM at RUSB Respiratory:  clear to auscultation bilaterally, normal work of breathing GI: soft, nontender, nondistended, + BS MS: no deformity or atrophy  Skin: warm and dry, no rash Neuro:  Alert and Oriented x 3, Strength and sensation are intact Psych: euthymic mood, full affect  Wt Readings from Last 3 Encounters:  05/23/16 110 lb (49.9 kg)  04/26/16 110 lb 9 oz (50.2 kg)  11/04/15 98 lb 3.2 oz (44.5 kg)      Studies/Labs Reviewed:   EKG:  EKG is ordered today.  The ekg ordered today demonstrates sinus bradycardia at 54 bpm with LVH by volatge with QRS widening and RBBB  Recent Labs: 07/09/2015: TSH 0.41   Lipid Panel    Component Value Date/Time   CHOL 205 (H) 08/26/2014 1516   TRIG 102 08/26/2014 1516   HDL 59 08/26/2014 1516   CHOLHDL 3.5 08/26/2014 1516   VLDL 20 08/26/2014 1516   LDLCALC 126 (H) 08/26/2014 1516    Additional studies/ records that were reviewed today include:  none    ASSESSMENT:    1. RBBB (right 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)   2. Myotonic dystrophy, type 1 ([place])   3. SOB (shortness of breath)   4. Heart palpitations      PLAN:  In order of problems listed above:  1. Chronic bifascicular block with RBBB and LAFB.   2. Type I Myotonic dystrophy - she has a family history of myotonic dystrophy and cardiac sarcoid and her dad had a heart transplant. 3. SOB which is chronic and stable - 2D echo in March was normal.  Cardiac MRI showed area of patchy gad uptake at the apex and mid basal anterior wall suspicious for possible sarcoid.  She has a failyy history of sarcoidosis. I will repeat cardiac MRI. 4. Palpitations - heart monitor with PACs and PVCs    Medication Adjustments/Labs and Tests Ordered: Current medicines are reviewed at length with the  patient today.  Concerns regarding medicines are outlined above.  Medication changes, Labs and Tests ordered today are listed in the Patient Instructions below.  There are no Patient Instructions on file for this visit.   Signed, Fransico Him, MD  05/23/2016 3:06 PM    [facility] [address], [place], [place]  [address] Phone: [phone]; Fax: [phone]

## 2016-05-23 ENCOUNTER — Ambulatory Visit (INDEPENDENT_AMBULATORY_CARE_PROVIDER_SITE_OTHER): Payer: Medicare Other | Admitting: Cardiology

## 2016-05-23 VITALS — BP 122/66 | HR 54 | Ht 63.0 in | Wt 110.0 lb

## 2016-05-23 DIAGNOSIS — R0602 Shortness of breath: Secondary | ICD-10-CM

## 2016-05-23 DIAGNOSIS — I452 Bifascicular block: Secondary | ICD-10-CM

## 2016-05-23 DIAGNOSIS — G7111 Myotonic muscular dystrophy: Secondary | ICD-10-CM | POA: Diagnosis not present

## 2016-05-23 DIAGNOSIS — R002 Palpitations: Secondary | ICD-10-CM | POA: Diagnosis not present

## 2016-05-23 NOTE — Patient Instructions (Signed)
Medication Instructions:  Your physician recommends that you continue on your current medications as directed. Please refer to the Current Medication list given to you today.   Labwork: NONE  Testing/Procedures: CARDIAC MRI NEEDS TO BE SCHEDULED; DR. Meda Coffee TO PER DR. Radford Pax  Follow-Up: Your physician wants you to follow-up in: [place] will receive a reminder letter in the mail two months in advance. If you don't receive a letter, please call our office to schedule the follow-up appointment.   Any Other Special Instructions Will Be Listed Below (If Applicable).     If you need a refill on your cardiac medications before your next appointment, please call your pharmacy.

## 2016-05-25 ENCOUNTER — Encounter: Payer: Self-pay | Admitting: Cardiology

## 2016-05-25 ENCOUNTER — Telehealth: Payer: Self-pay | Admitting: Cardiology

## 2016-05-25 NOTE — Telephone Encounter (Signed)
New message     Patient calling stating someone had called them today -returning call back

## 2016-05-25 NOTE — Telephone Encounter (Signed)
Called and left a voicemail regarding cardiac MRI.

## 2016-06-02 ENCOUNTER — Ambulatory Visit (HOSPITAL_COMMUNITY): Payer: Medicare Other

## 2016-06-05 ENCOUNTER — Ambulatory Visit (HOSPITAL_COMMUNITY)
Admission: RE | Admit: 2016-06-05 | Discharge: 2016-06-05 | Disposition: A | Discharge status: Home / Self Care | Payer: Medicare Other | Source: Ambulatory Visit | Attending: Cardiology | Admitting: Cardiology

## 2016-06-05 DIAGNOSIS — R9439 Abnormal result of other cardiovascular function study: Secondary | ICD-10-CM | POA: Diagnosis not present

## 2016-06-05 DIAGNOSIS — I071 Rheumatic tricuspid insufficiency: Secondary | ICD-10-CM | POA: Insufficient documentation

## 2016-06-05 DIAGNOSIS — R931 Abnormal findings on diagnostic imaging of heart and coronary circulation: Secondary | ICD-10-CM | POA: Diagnosis not present

## 2016-06-05 DIAGNOSIS — G7111 Myotonic muscular dystrophy: Secondary | ICD-10-CM | POA: Insufficient documentation

## 2016-06-05 LAB — CREATININE, SERUM: CREATININE: 0.61 mg/dL (ref 0.44–1.00)

## 2016-06-05 MED ORDER — GADOBENATE DIMEGLUMINE 529 MG/ML IV SOLN
20.0000 mL | Freq: Once | INTRAVENOUS | Status: AC | PRN
  Administered 2016-06-05: 20 mL via INTRAVENOUS

## 2016-06-15 ENCOUNTER — Other Ambulatory Visit: Payer: Self-pay

## 2016-07-17 ENCOUNTER — Telehealth: Payer: Self-pay | Admitting: Cardiology

## 2016-07-17 NOTE — Telephone Encounter (Signed)
Explained to patient that the doctors are discussing her case and reiterated that she has not been forgotten. She understands she will be contacted once the specialists have decided what imaging to complete. She was grateful for update.

## 2016-07-17 NOTE — Telephone Encounter (Signed)
New message      Pt is calling to check the status on her appt for a PET scan in [place].  She states we were to call and schedule an appt for her and she has not heard from anyone. Please call

## 2016-09-25 ENCOUNTER — Telehealth: Payer: Self-pay | Admitting: Cardiology

## 2016-09-25 NOTE — Telephone Encounter (Signed)
Message given to [facility] to return call. [facility] originally called patient to schedule MRI at [facility], not nursing).

## 2016-09-25 NOTE — Telephone Encounter (Signed)
Patient states she just received a call from this office regarding scheduling a PET scan at [facility]

## 2016-10-17 ENCOUNTER — Telehealth: Payer: Self-pay | Admitting: Neurology

## 2016-10-17 ENCOUNTER — Encounter: Payer: Self-pay | Admitting: *Deleted

## 2016-10-17 NOTE — Telephone Encounter (Signed)
PT left a message she needs a letter saying she can't walk up steps/Dawn CB# [phone]

## 2016-10-18 NOTE — Telephone Encounter (Signed)
Left message informing patient that her letter is ready.

## 2016-10-26 ENCOUNTER — Encounter: Payer: Self-pay | Admitting: Cardiology

## 2016-10-26 DIAGNOSIS — G7111 Myotonic muscular dystrophy: Secondary | ICD-10-CM | POA: Diagnosis not present

## 2016-10-26 DIAGNOSIS — I4510 Unspecified right bundle-branch block: Secondary | ICD-10-CM | POA: Diagnosis not present

## 2016-10-26 DIAGNOSIS — I452 Bifascicular block: Secondary | ICD-10-CM | POA: Diagnosis not present

## 2016-10-26 DIAGNOSIS — Z79899 Other long term (current) drug therapy: Secondary | ICD-10-CM | POA: Diagnosis not present

## 2016-10-26 DIAGNOSIS — F329 Major depressive disorder, single episode, unspecified: Secondary | ICD-10-CM | POA: Diagnosis not present

## 2016-10-26 DIAGNOSIS — R002 Palpitations: Secondary | ICD-10-CM | POA: Diagnosis not present

## 2016-10-26 DIAGNOSIS — M7989 Other specified soft tissue disorders: Secondary | ICD-10-CM | POA: Diagnosis not present

## 2016-10-26 DIAGNOSIS — D649 Anemia, unspecified: Secondary | ICD-10-CM | POA: Diagnosis not present

## 2016-10-26 LAB — POCT URINE PREGNANCY: Preg Test, Ur: NEGATIVE

## 2016-10-30 ENCOUNTER — Telehealth: Payer: Self-pay

## 2016-10-30 NOTE — Telephone Encounter (Signed)
Per Dr. Radford Pax, informed patient that PET from [facility] showed no evidence of sarcoidosis. Confirmed follow-up appointment next month. She was grateful for call.

## 2016-11-10 ENCOUNTER — Emergency Department (HOSPITAL_COMMUNITY)
Admission: EM | Admit: 2016-11-10 | Discharge: 2016-11-10 | Disposition: A | Discharge status: Home / Self Care | Payer: Medicare Other | Attending: Emergency Medicine | Admitting: Emergency Medicine

## 2016-11-10 ENCOUNTER — Encounter (HOSPITAL_COMMUNITY): Payer: Self-pay | Admitting: *Deleted

## 2016-11-10 DIAGNOSIS — H1032 Unspecified acute conjunctivitis, left eye: Secondary | ICD-10-CM | POA: Diagnosis not present

## 2016-11-10 DIAGNOSIS — Z79899 Other long term (current) drug therapy: Secondary | ICD-10-CM | POA: Diagnosis not present

## 2016-11-10 DIAGNOSIS — N3000 Acute cystitis without hematuria: Secondary | ICD-10-CM | POA: Diagnosis not present

## 2016-11-10 DIAGNOSIS — F17210 Nicotine dependence, cigarettes, uncomplicated: Secondary | ICD-10-CM | POA: Insufficient documentation

## 2016-11-10 DIAGNOSIS — R350 Frequency of micturition: Secondary | ICD-10-CM | POA: Diagnosis present

## 2016-11-10 DIAGNOSIS — H1089 Other conjunctivitis: Secondary | ICD-10-CM | POA: Diagnosis not present

## 2016-11-10 DIAGNOSIS — B9689 Other specified bacterial agents as the cause of diseases classified elsewhere: Secondary | ICD-10-CM | POA: Diagnosis not present

## 2016-11-10 LAB — URINALYSIS, ROUTINE W REFLEX MICROSCOPIC
Bilirubin Urine: NEGATIVE
Glucose, UA: NEGATIVE mg/dL
Hgb urine dipstick: NEGATIVE
Ketones, ur: NEGATIVE mg/dL
NITRITE: POSITIVE — AB
PH: 6 (ref 5.0–8.0)
Protein, ur: NEGATIVE mg/dL
SPECIFIC GRAVITY, URINE: 1.02 (ref 1.005–1.030)

## 2016-11-10 LAB — RAPID STREP SCREEN (MED CTR MEBANE ONLY): Streptococcus, Group A Screen (Direct): NEGATIVE

## 2016-11-10 MED ORDER — SULFAMETHOXAZOLE-TRIMETHOPRIM 800-160 MG PO TABS
1.0000 | ORAL_TABLET | Freq: Once | ORAL | Status: AC
  Administered 2016-11-10: 1 via ORAL
  Filled 2016-11-10: qty 1

## 2016-11-10 MED ORDER — ERYTHROMYCIN 5 MG/GM OP OINT
1.0000 "application " | TOPICAL_OINTMENT | Freq: Once | OPHTHALMIC | Status: AC
  Administered 2016-11-10: 1 via OPHTHALMIC
  Filled 2016-11-10: qty 3.5

## 2016-11-10 MED ORDER — SULFAMETHOXAZOLE-TRIMETHOPRIM 800-160 MG PO TABS: 1.0000 | ORAL_TABLET | Freq: Two times a day (BID) | ORAL | 0 refills | Status: AC

## 2016-11-10 NOTE — ED Triage Notes (Signed)
The pt is c/o general body aches since Sunday  Headache both eyes irritated and red,  Throat sore

## 2016-11-10 NOTE — ED Provider Notes (Signed)
[facility] DEPT Provider Note   CSN: [id number] Arrival date & time: 11/10/16  1920  By signing my name below, I, Cynthia Underwood, attest that this documentation has been prepared under the direction and in the presence of  [place]. Janit Bern, NP. Electronically Signed: Hansel Underwood, ED Scribe. 11/10/16. 9:06 PM.    History   Chief Complaint Chief Complaint  Patient presents with  . Generalized Body Aches    HPI Cynthia Underwood is a 46 y.o. female with h/o myotonic dystrophy type I who presents to the Emergency Department complaining of moderate generalized body aches that began 5 days ago with associated subjective fever, sore throat, ear pain, sinus pain, cough, diarrhea (x2) that began today, frequency. Pt states her sore throat is worsened with swallowing. She is tolerating food and fluids. Pt denies taking OTC medications at home to improve symptoms. No recent sick contact with strep throat. Pt also complains of left eye redness, itching and burning with crusty drainage that began today. She denies visual changes, nasal congestion, nausea, vomiting, abdominal pain, dysuria.    The history is provided by the patient. No language interpreter was used.  Influenza  Presenting symptoms: cough, diarrhea, fever (subjective), myalgias (generalized ) and sore throat   Presenting symptoms: no nausea, no shortness of breath and no vomiting   Severity:  Moderate Onset quality:  Gradual Duration:  5 days Progression:  Worsening Chronicity:  New Relieved by:  None tried Worsened by:  Nothing Ineffective treatments:  None tried Associated symptoms: ear pain   Associated symptoms: no chills and no congestion   Risk factors: no sick contacts     Past Medical History:  Diagnosis Date  . Anemia   . Bifascicular block 10/02/2014  . Depression   . Excess ear wax   . Headache   . Myotonic dystrophy, type 1 ([place])    Genetically confirmed  . RBBB (right bundle branch block with left anterior  fascicular block) 10/02/2014  . Watery eyes    Left  . Weakness of both legs     Patient Active Problem List   Diagnosis Date Noted  . Heart palpitations 05/21/2016  . SOB (shortness of breath) 10/02/2014  . 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 Infection due to trichomonas vaginalis 09/15/2014  . Myotonic dystrophy, type 1 ([place]) 09/14/2014    Past Surgical History:  Procedure Laterality Date  . BIOPSY BREAST Right     OB History    Gravida Para Term Preterm AB Living   2       2 0   SAB TAB Ectopic Multiple Live Births     2             Home Medications    Prior to Admission medications   Medication Sig Start Date End Date Taking? Authorizing Provider  sulfamethoxazole-trimethoprim (BACTRIM DS,SEPTRA DS) 800-160 MG tablet Take 1 tablet by mouth 2 (two) times daily. 11/10/16 11/17/16  Hope Bunnie Pion, NP  tiZANidine (ZANAFLEX) 2 MG tablet Take 1 tablet (2 mg total) by mouth at bedtime. 04/26/16   Alda Berthold, DO    Family History Family History  Problem Relation Age of Onset  . Cancer Father 49    Deceased  . Heart disease Father   . COPD Mother   . Diabetes Maternal Uncle   . Heart disease Maternal Uncle   . Heart disease Paternal Grandmother   . Diabetes Paternal Grandmother   .  Hypertension Paternal Grandmother   . Neuromuscular disorder Maternal Aunt     Myotonic dystrophy  . Neuromuscular disorder Cousin     Myotonic dystrophy  . Neuromuscular disorder Sister     Myotonic dystrophy    Social History Social History  Substance Use Topics  . Smoking status: Current Every Day Smoker    Packs/day: 0.25    Years: 26.00    Types: Cigarettes  . Smokeless tobacco: Never Used  . Alcohol use 0.0 oz/week     Comment: Rarely only holidays     Allergies   Penicillins   Review of Systems Review of Systems  Constitutional: Positive for fever (subjective). Negative for chills.  HENT: Positive for ear pain, sinus  pain and sore throat. Negative for congestion.   Eyes: Positive for photophobia, pain, discharge, redness and itching. Negative for visual disturbance.       Left eye  Respiratory: Positive for cough. Negative for shortness of breath.   Cardiovascular: Negative for chest pain.  Gastrointestinal: Positive for diarrhea. Negative for abdominal pain, nausea and vomiting.  Genitourinary: Positive for frequency. Negative for dysuria.  Musculoskeletal: Positive for myalgias (generalized ).  Skin: Negative for rash.  Neurological: Negative for syncope.  Psychiatric/Behavioral: Negative for confusion.     Physical Exam Updated Vital Signs BP 100/63   Pulse (!) 58   Temp 97.7 F (36.5 C) (Oral)   Resp 20   LMP 10/30/2016   SpO2 98%   Physical Exam  Constitutional: She appears well-developed and well-nourished. No distress.  HENT:  Head: Normocephalic and atraumatic.  Right Ear: Tympanic membrane normal.  Nose: Nose normal.  Mouth/Throat: Uvula is midline and mucous membranes are normal. Posterior oropharyngeal erythema present. No posterior oropharyngeal edema.  Left TM visualization occluded by cerumen. Right TM normal.   Eyes: EOM are normal. Pupils are equal, round, and reactive to light. Left eye exhibits discharge. Left conjunctiva is injected. No scleral icterus.  Neck: Normal range of motion. Neck supple.  Cardiovascular: Normal rate, regular rhythm and normal heart sounds.   Pulmonary/Chest: Effort normal. She has no wheezes. She has no rales.  Abdominal: Soft. Bowel sounds are normal. There is no tenderness.  Musculoskeletal: Normal range of motion.  Lymphadenopathy:    She has no cervical adenopathy.  Neurological: She is alert.  Skin: Skin is warm and dry.  Psychiatric: She has a normal mood and affect. Her behavior is normal.  Nursing note and vitals reviewed.    ED Treatments / Results   DIAGNOSTIC STUDIES: Oxygen Saturation is 98% on RA, normal by my  interpretation.    COORDINATION OF CARE: 9:03 PM Discussed treatment plan with pt at bedside which includes rapid strep, UA and pt agreed to plan.    Labs (all labs ordered are listed, but only abnormal results are displayed) Labs Reviewed  URINALYSIS, ROUTINE W REFLEX MICROSCOPIC - Abnormal; Notable for the following:       Result Value   APPearance HAZY (*)    Nitrite POSITIVE (*)    Leukocytes, UA MODERATE (*)    Bacteria, UA MANY (*)    Squamous Epithelial / LPF 0-5 (*)    All other components within normal limits  RAPID STREP SCREEN (NOT AT [facility])  CULTURE, GROUP A STREP [facility])  URINE CULTURE   Radiology No results found.  Procedures Procedures (including critical care time)  Medications Ordered in ED Medications  sulfamethoxazole-trimethoprim (BACTRIM DS,SEPTRA DS) 800-160 MG per tablet 1 tablet (1 tablet Oral Given 11/10/16  2316)  erythromycin ophthalmic ointment 1 application (1 application Left Eye Given 11/10/16 2330)     Initial Impression / Assessment and Plan / ED Course  I have reviewed the triage vital signs and the nursing notes.  Pertinent lab results that were available during my care of the patient were reviewed by me and considered in my medical decision making (see chart for details).   Pt diagnosed with a UTI and conjunctivitis. Pt is without fever, tachycardia, hypotension, or other signs of serious infection. .  Presentation not concerning for iritis, or corneal abrasions.  Pt discharged with erythromycin opth ointment for conjunctivitis and Bactrim for UTI.  Personal hygiene and frequent handwashing discussed. Discussed with the patient that antibiotic may cause flair of her muscle aches but needed for treatment of UTI. Patient advised to follow up with ophthalmologist or PCP if symptoms persist or worsen.  Discussed return precautions. Pt appears safe for discharge. Patient verbalizes understanding and is agreeable with discharge. Pt has f/u with her  neurologist in 3 days and was given strict return precautions. Pt advised that medications prescribed may cause increased pain or fatigue with her h/o myotonic dystrophy.    Final Clinical Impressions(s) / ED Diagnoses   Final diagnoses:  Acute cystitis without hematuria  Acute bacterial conjunctivitis of left eye    New Prescriptions Discharge Medication List as of 11/10/2016 11:18 PM    START taking these medications   Details  sulfamethoxazole-trimethoprim (BACTRIM DS,SEPTRA DS) 800-160 MG tablet Take 1 tablet by mouth 2 (two) times daily., Starting Fri 11/10/2016, Until Fri 11/17/2016, Print        I personally performed the services described in this documentation, which was scribed in my presence. The recorded information has been reviewed and is accurate.    [address] [place], [place] 11/11/16 3244    Blanchie Dessert, MD 11/13/16 2056

## 2016-11-10 NOTE — ED Notes (Signed)
Patient able to ambulate independently  

## 2016-11-10 NOTE — Discharge Instructions (Signed)
Treatment with antibiotics can cause a flair of your muscle pain and fatigue, however, you need the antibiotics for your infection. Keep your appointment with the Neurologist Monday. Return here sooner for worsening symptoms.

## 2016-11-13 ENCOUNTER — Ambulatory Visit: Payer: Medicare Other | Admitting: Neurology

## 2016-11-13 LAB — CULTURE, GROUP A STREP (THRC)

## 2016-11-13 LAB — URINE CULTURE: Culture: >=100000 — AB

## 2016-11-14 ENCOUNTER — Telehealth: Payer: Self-pay | Admitting: Emergency Medicine

## 2016-11-14 NOTE — Telephone Encounter (Signed)
Post ED Visit - Positive Culture Follow-up  Culture report reviewed by antimicrobial stewardship pharmacist:  [x]  Elenor Quinones, Pharm.D. []  Heide Guile, Pharm.D., BCPS []  Parks Neptune, Pharm.D. []  Alycia Rossetti, Pharm.D., BCPS []  [place], Pharm.D., BCPS, AAHIVP []  Legrand Como, Pharm.D., BCPS, AAHIVP []  Milus Glazier, Pharm.D. []  Stephens November, [place].D.  Positive urine culture Treated with bactrim DS, organism sensitive to the same and no further patient follow-up is required at this time.  Hazle Nordmann 11/14/2016, 2:06 PM

## 2016-11-15 ENCOUNTER — Encounter: Payer: Self-pay | Admitting: Cardiology

## 2016-11-23 ENCOUNTER — Ambulatory Visit (INDEPENDENT_AMBULATORY_CARE_PROVIDER_SITE_OTHER): Payer: Medicare Other | Admitting: Cardiology

## 2016-11-23 ENCOUNTER — Encounter (INDEPENDENT_AMBULATORY_CARE_PROVIDER_SITE_OTHER): Payer: Self-pay

## 2016-11-23 ENCOUNTER — Encounter: Payer: Self-pay | Admitting: Cardiology

## 2016-11-23 VITALS — BP 112/68 | HR 61 | Ht 63.0 in | Wt 112.8 lb

## 2016-11-23 DIAGNOSIS — G7111 Myotonic muscular dystrophy: Secondary | ICD-10-CM

## 2016-11-23 DIAGNOSIS — R0602 Shortness of breath: Secondary | ICD-10-CM | POA: Diagnosis not present

## 2016-11-23 NOTE — Progress Notes (Signed)
Cardiology Office Note    Date:  11/26/2016   ID:  Cynthia Underwood, Cynthia Underwood [birth year], MRN [id number]  PCP:  Lance Bosch, NP  Cardiologist:  Fransico Him, MD   Chief Complaint  Patient presents with  . Follow-up    myotonic dystrophy and palpitations    History of Present Illness:  Cynthia Underwood is a 46 y.o. female  who presents for followup of abnormal cardiac MR. She has a history of myotonic dystrophy Type I since age 24 with intermittent jaw locking, finger stiffness and difficulty relaxing muscles.She was diagnosed by genetic testing. She has a family history of myotonic dystrophy and sarcoidosis with her Dad having a cardiac transplant.  She is followed with Neurology. She has been followed with yearly echo.  When I saw her in 2016 she complained of SOB that had worsened and a cardiac MRI was done to rule out sarcoid given her family history of not only myotonic dystrophy buy sarcoidosis.  This showed myocardial thinning associated with hypokinesis and subendocardial late gadolinium enhancement in the mid inferior and inferolateral walls that was felt to represent either prior non-transmural infarct (low probability in this young female), cardiac sarcoidosis (subendocardial LGE is rare, more frequently there is midwall or subepicardial involvement). This could also represent cardiac involvement in myotonic dystrophy, however the most typical presentation LV dilatation and dysfunction is missing.  At last OV I ordered a FDG-PET scan which showed no evidence of myocardial FDG uptake indicating that there was no evidence of sarcoidosis and the abnormality noted on cardiac MRI was likely due to myotonic dystrophy.   She denies any chest pain, syncope, dizziness or palpitations.  She has chronic DOE and fatigue going up stairs related to her myotonic dystrophy that she thinks has gotten worse. Her LE edema has resolved but has chronic pain in them.  She has stopped smoking and her  palpitations have resolved.   Past Medical History:  Diagnosis Date  . Anemia   . Bifascicular block 10/02/2014  . Depression   . Excess ear wax   . Headache   . 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 Watery eyes    Left  . Weakness of both legs     Past Surgical History:  Procedure Laterality Date  . BIOPSY BREAST Right     Current Medications: No outpatient prescriptions have been marked as taking for the 11/23/16 encounter (Office Visit) with Sueanne Margarita, MD.    Allergies:   Penicillins   Social History   Social History  . Marital status: Single    Spouse name: N/A  . Number of children: 0  . Years of education: 1.5 Collge   Occupational History  . Unemployed Other   Social History Main Topics  . Smoking status: Current Every Day Smoker    Packs/day: 0.25    Years: 26.00    Types: Cigarettes  . Smokeless tobacco: Never Used  . Alcohol use 0.0 oz/week     Comment: Rarely only holidays  . Drug use: Yes     Comment: Marijuana  . Sexual activity: Not Currently    Birth control/ protection: None   Other Topics Concern  . None   Social History Narrative   Patient lives at home with her aunt.   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  She moved to [facility]  in August 2015.   She has been on disability since 2010.   Education: 1 1/2 years of college.        Family History:  The patient's family history includes COPD in her mother; Cancer (age of onset: 63) in her father; Diabetes in her maternal uncle and paternal grandmother; Heart disease in her father, maternal uncle, and paternal grandmother; Hypertension in her paternal grandmother; Neuromuscular disorder in her cousin, maternal aunt, and sister.   ROS:   Please see the history of present illness.    ROS All other systems reviewed and are negative.  No flowsheet data  found.     PHYSICAL EXAM:   VS:  BP 112/68   Pulse 61   Ht 5\' 3"  (1.6 m)   Wt 112 lb 12.8 oz (51.2 kg)   LMP 10/30/2016   SpO2 98%   BMI 19.98 kg/m    GEN: Well nourished, well developed, in no acute distress  HEENT: normal  Neck: no JVD, carotid bruits, or masses Cardiac: RRR; no murmurs, rubs, or gallops,no edema.  Intact distal pulses bilaterally.  Respiratory:  clear to auscultation bilaterally, normal work of breathing GI: soft, nontender, nondistended, + BS MS: no deformity or atrophy  Skin: warm and dry, no rash Neuro:  Alert and Oriented x 3, Strength and sensation are intact Psych: euthymic mood, full affect  Wt Readings from Last 3 Encounters:  11/23/16 112 lb 12.8 oz (51.2 kg)  05/23/16 110 lb (49.9 kg)  04/26/16 110 lb 9 oz (50.2 kg)      Studies/Labs Reviewed:   EKG:  EKG is not ordered today.   Recent Labs: 06/05/2016: Creatinine, Ser 0.61   Lipid Panel    Component Value Date/Time   CHOL 205 (H) 08/26/2014 1516   TRIG 102 08/26/2014 1516   HDL 59 08/26/2014 1516   CHOLHDL 3.5 08/26/2014 1516   VLDL 20 08/26/2014 1516   LDLCALC 126 (H) 08/26/2014 1516    Additional studies/ records that were reviewed today include:  Cardiac PET scan    ASSESSMENT:    1. Myotonic dystrophy ([place])   2. Shortness of breath      PLAN:  In order of problems listed above:  1.  Myotonic dystrophy with cardiac MRI showing myocardial thinning associated with hypokinesis and subendocardial late gadolinium enhancement in the mid inferior and inferolateral walls which is a non-specific findings and felt to represent either prior non-transmural infarct (low probability in this young female), cardiac sarcoidosis (subendocardial LGE is rare, more frequently there is midwall or subepicardial involvement) or could represent cardiac involvement in myotonic dystrophy, however the most typical presentation LV dilatation and dysfunction was missing.  Cardiac FD[facility]PET scan at  [facility] was normal and therefore her cardiac MRI findings were more likely to represent myotonic dystrophy.  I will continue to follow yearly echo to assess for any change in LVF.    2.  Abnormal EKG with RBBB and LAFB - this is concerning given her underying myotonic dystrophy.  She had genetic testing and is DM1 positive which does increase her risk of conduction system disease as well as SCD.  She denies any recent dizziness but had syncope several years ago in the setting of dehydration according to the patient.  Her QRS duration is 148 ms.  I am going to refer her to EP for further evaluation of whether she needs EP study to determine if she has evidence of higher grade conduction disease and also if ICD needs  to be considered. I will order a 30 day event monitor to rule out any arrhythmias.   3.  SOB - She has chronic SOB which is getting worse.  This is likely not cardiac as her EF is still preserved at 56% on cardiac MRI.  I will refer her to pulmonary for further evaluation.  She will followup with me in 6 months    Medication Adjustments/Labs and Tests Ordered: Current medicines are reviewed at length with the patient today.  Concerns regarding medicines are outlined above.  Medication changes, Labs and Tests ordered today are listed in the Patient Instructions below.  Patient Instructions  Medication Instructions:  Your physician recommends that you continue on your current medications as directed. Please refer to the Current Medication list given to you today.   Labwork: None  Testing/Procedures: Your physician has requested that you have an echocardiogram. Echocardiography is a painless test that uses sound waves to create images of your heart. It provides your doctor with information about the size and shape of your heart and how well your heart's chambers and valves are working. This procedure takes approximately one hour. There are no restrictions for this  procedure.  Follow-Up: You have been referred to Dr. Chase Caller for shortness of breath with myotonic dystrophy.  Your physician wants you to follow-up in: 6 months with Dr. Radford Pax. You will receive a reminder letter in the mail two months in advance. If you don't receive a letter, please call our office to schedule the follow-up appointment.   Any Other Special Instructions Will Be Listed Below (If Applicable).     If you need a refill on your cardiac medications before your next appointment, please call your pharmacy.      Signed, Fransico Him, MD  11/26/2016 4:04 PM    [facility] [address], [place], [place]  [address] Phone: [phone]; Fax: [phone]

## 2016-11-23 NOTE — Patient Instructions (Signed)
Medication Instructions:  Your physician recommends that you continue on your current medications as directed. Please refer to the Current Medication list given to you today.   Labwork: None  Testing/Procedures: Your physician has requested that you have an echocardiogram. Echocardiography is a painless test that uses sound waves to create images of your heart. It provides your doctor with information about the size and shape of your heart and how well your heart's chambers and valves are working. This procedure takes approximately one hour. There are no restrictions for this procedure.  Follow-Up: You have been referred to Dr. Chase Caller for shortness of breath with myotonic dystrophy.  Your physician wants you to follow-up in: 6 months with Dr. Radford Pax. You will receive a reminder letter in the mail two months in advance. If you don't receive a letter, please call our office to schedule the follow-up appointment.   Any Other Special Instructions Will Be Listed Below (If Applicable).     If you need a refill on your cardiac medications before your next appointment, please call your pharmacy.

## 2016-11-30 ENCOUNTER — Telehealth: Payer: Self-pay

## 2016-11-30 DIAGNOSIS — G7111 Myotonic muscular dystrophy: Secondary | ICD-10-CM

## 2016-11-30 NOTE — Telephone Encounter (Signed)
-----   Message from Sueanne Margarita, MD sent at 11/26/2016  4:07 PM EDT ----- Please order a 30 day heart monitor for patient under Dx myotonic dystrophy and also refer to Dr. Curt Bears for EP eval of RBBB/LAFB and ? Need forf AICD.  I had talked with the patient about this at last OV and told her we may refer her to EP as there are increased risks of electrical problems with myotonic dystropyl Thanks Traci

## 2016-11-30 NOTE — Telephone Encounter (Signed)
Left message to call back  

## 2016-12-01 ENCOUNTER — Other Ambulatory Visit: Payer: Self-pay | Admitting: *Deleted

## 2016-12-01 DIAGNOSIS — G7111 Myotonic muscular dystrophy: Secondary | ICD-10-CM

## 2016-12-01 NOTE — Telephone Encounter (Signed)
f/u message ° °Pt returning RN call. Please call back to discuss  °

## 2016-12-01 NOTE — Telephone Encounter (Signed)
PT AWARE OF  TREATMENT  PLAN  .Cynthia Underwood

## 2016-12-07 ENCOUNTER — Other Ambulatory Visit: Payer: Self-pay | Admitting: Internal Medicine

## 2016-12-07 DIAGNOSIS — Z1231 Encounter for screening mammogram for malignant neoplasm of breast: Secondary | ICD-10-CM

## 2016-12-11 ENCOUNTER — Other Ambulatory Visit: Payer: Self-pay

## 2016-12-11 ENCOUNTER — Ambulatory Visit (HOSPITAL_COMMUNITY): Payer: Medicare Other | Attending: Cardiovascular Disease

## 2016-12-11 DIAGNOSIS — R0602 Shortness of breath: Secondary | ICD-10-CM

## 2016-12-11 DIAGNOSIS — I4510 Unspecified right bundle-branch block: Secondary | ICD-10-CM | POA: Diagnosis not present

## 2016-12-11 DIAGNOSIS — G7111 Myotonic muscular dystrophy: Secondary | ICD-10-CM | POA: Diagnosis not present

## 2016-12-12 ENCOUNTER — Other Ambulatory Visit: Payer: Self-pay | Admitting: Cardiology

## 2016-12-12 ENCOUNTER — Ambulatory Visit (INDEPENDENT_AMBULATORY_CARE_PROVIDER_SITE_OTHER): Payer: Medicare Other

## 2016-12-12 DIAGNOSIS — R0602 Shortness of breath: Secondary | ICD-10-CM | POA: Diagnosis not present

## 2016-12-12 DIAGNOSIS — I452 Bifascicular block: Secondary | ICD-10-CM

## 2016-12-12 DIAGNOSIS — G7111 Myotonic muscular dystrophy: Secondary | ICD-10-CM

## 2016-12-12 DIAGNOSIS — R002 Palpitations: Secondary | ICD-10-CM

## 2016-12-22 ENCOUNTER — Encounter: Payer: Self-pay | Admitting: Internal Medicine

## 2016-12-22 ENCOUNTER — Ambulatory Visit (INDEPENDENT_AMBULATORY_CARE_PROVIDER_SITE_OTHER): Payer: Medicare Other | Admitting: Internal Medicine

## 2016-12-22 DIAGNOSIS — R0689 Other abnormalities of breathing: Secondary | ICD-10-CM

## 2016-12-22 DIAGNOSIS — R0600 Dyspnea, unspecified: Secondary | ICD-10-CM

## 2016-12-22 NOTE — Assessment & Plan Note (Signed)
Suspect is due to myotonic dystrophy Has hx of PFT restriction Currently unable to do nitiric oxide test for asthma due to dystrophy Doubt can do PFT test  Plan  do HRCT chest  Do ABG at pft lab for hypercapnia Do NIF at pft lab  [facility] Return after HRCT , abg an dNIF Return next few weeks but after above

## 2016-12-22 NOTE — Addendum Note (Signed)
Addended by: Collier Salina on: 12/22/2016 02:44 PM   Modules accepted: Orders

## 2016-12-22 NOTE — Progress Notes (Signed)
Subjective:    Patient ID: Cynthia Underwood, female    DOB: May 18, 1971, 46 y.o.   MRN: [id number]  HPI   OV 12/22/2016  Chief Complaint  Patient presents with  . pulmonary consult    refered by Dr Radford Pax for SOB. no chest pain. pt c/o coughing non product   46 year old African-American female. 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 She's had this since age 89. Her current features include lockjaw and stiffness of the hands and fingersassociated with fatigue, weakness, daytime sleepiness or shortness of breath. She walks independently but has been told to use braces. She has lower extremity weakness as well particularly proximal muscles.Over the last few years she's also had dysphagia for which she uses a chin tuck approach. She sees Dr. Posey Pronto in the neurology clinic  She has now been referred to the pulmonary clinic because of her shortness of breath. She tells me that she's been dyspneic for over 20 years. For the last few years it has been progressive. It is moderate to severe in intensity. It is associated with exertion relieved by rest. Activities of daily living can make her dyspneic. Is also orthopnea sometimes. There is some cough associated but no wheezing or sputum production. There is no associated chest pain or diaphoresis or weight loss. She believes she does not have sarcoidosis.  04/03/2011: Pulmonary function test done at [facility] shows uniform restriction with low DLCO 50s%. Personally reviewed the trace and visualized it 05/16/2014: Hemoglobin is 12.8 g percent 10/02/2014: Angiotensin-converting enzyme was normal 06/05/2016: Creatinine is normal 0.6 mg percent 10/26/2016 pregnancy test normal 10/26/2016: Myocardial perfusion PET scan: Negative for uptake for sarcoid 12/11/2016: Echocardiogram is normal  12/22/2016 : walking desat test  with forehead probe on room air: did not desat. Pulse ox stayed > 95%. HR 70s to 84/min Stopped a fe times due to dyspnea and fatigue and neuromuscular imbalance  12/22/2016 - feno for asthma: could not do due to difficulty with maneuvers   has a past medical history of Anemia; Bifascicular block (10/02/2014); Depression; Excess ear wax; Headache; Myotonic dystrophy, type 1 ([facility]); RBBB (right 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) (10/02/2014); Watery eyes; and Weakness of both legs.   reports that she quit smoking about 3 months ago. Her smoking use included Cigarettes. She has a 13.00 pack-year smoking history. She has never used smokeless tobacco.  Past Surgical History:  Procedure Laterality Date  . BIOPSY BREAST Right   . EYE SURGERY Left 2017    Allergies  Allergen Reactions  . Penicillins Nausea And Vomiting, Rash and Other (See Comments)    fever    Immunization History  Administered Date(s) Administered  . Influenza,inj,Quad PF,36+ Mos 08/11/2014, 07/01/2015    Family History  Problem Relation Age of Onset  . Cancer Father 19    Deceased  . Heart disease Father   . COPD Mother   . Diabetes Maternal Uncle   . Heart disease Maternal Uncle   . Heart disease Paternal Grandmother   . Diabetes Paternal Grandmother   . Hypertension Paternal Grandmother   . Neuromuscular disorder Maternal Aunt     Myotonic dystrophy  . Neuromuscular disorder Cousin     Myotonic dystrophy  . Neuromuscular disorder Sister     Myotonic dystrophy    No current outpatient prescriptions on file.    Review of Systems  Constitutional: Negative  for fever and unexpected weight change.  HENT: Negative for congestion, dental problem, ear pain, nosebleeds, postnasal drip, rhinorrhea, sinus pressure, sneezing, sore throat and trouble swallowing.   Eyes: Negative for redness and itching.  Respiratory: Positive for cough and shortness of breath. Negative for chest tightness and  wheezing.   Cardiovascular: Negative for palpitations and leg swelling.  Gastrointestinal: Negative for nausea and vomiting.  Genitourinary: Negative for dysuria.  Musculoskeletal: Negative for joint swelling.  Skin: Negative for rash.  Neurological: Negative for headaches.  Hematological: Does not bruise/bleed easily.  Psychiatric/Behavioral: Negative for dysphoric mood. The patient is not nervous/anxious.        Objective:   Physical Exam  Constitutional: She is oriented to person, place, and time. She appears well-developed and well-nourished. No distress.  HENT:  Head: Normocephalic and atraumatic.  Right Ear: External ear normal.  Left Ear: External ear normal.  Mouth/Throat: Oropharynx is clear and moist. No oropharyngeal exudate.  Mild slurred speech  Eyes: Conjunctivae and EOM are normal. Pupils are equal, round, and reactive to light. Right eye exhibits no discharge. Left eye exhibits no discharge. No scleral icterus.  Neck: Normal range of motion. Neck supple. No JVD present. No tracheal deviation present. No thyromegaly present.  Cardiovascular: Normal rate, regular rhythm, normal heart sounds and intact distal pulses.  Exam reveals no gallop and no friction rub.   No murmur heard. Pulmonary/Chest: Effort normal and breath sounds normal. No respiratory distress. She has no wheezes. She has no rales. She exhibits no tenderness.  Abdominal: Soft. Bowel sounds are normal. She exhibits no distension and no mass. There is no tenderness. There is no rebound and no guarding.  Musculoskeletal: Normal range of motion. She exhibits no edema or tenderness.  emacitated muscles imabalanced gait  Lymphadenopathy:    She has no cervical adenopathy.  Neurological: She is alert and oriented to person, place, and time. She has normal reflexes. No cranial nerve deficit. She exhibits normal muscle tone. Coordination normal.  Skin: Skin is warm and dry. No rash noted. She is not diaphoretic.  No erythema. No pallor.  Psychiatric: She has a normal mood and affect. Her behavior is normal. Judgment and thought content normal.  Vitals reviewed.   Vitals:   12/22/16 1349  BP: 102/68  Pulse: 82  SpO2: 96%  Weight: 109 lb (49.4 kg)  Height: 5\' 3"  (1.6 m)    Estimated body mass index is 19.31 kg/m as calculated from the following:   Height as of this encounter: 5\' 3"  (1.6 m).   Weight as of this encounter: 109 lb (49.4 kg).        Assessment & Plan:     ICD-9-CM ICD-10-CM   1. Dyspnea and respiratory abnormality 786.09 R06.00     R06.89      Dyspnea and respiratory abnormality Suspect is due to myotonic dystrophy Has hx of PFT restriction Currently unable to do nitiric oxide test for asthma due to dystrophy Doubt can do PFT test  Plan  do HRCT chest  Do ABG at pft lab for hypercapnia Do NIF at pft lab  [facility] Return after HRCT , abg an dNIF Return next few weeks but after above    Dr. Brand Males, M.D., [facility].C.P Pulmonary and Critical Care Medicine Staff Physician [facility] Pager: [phone], If no answer or between  15:00h - 7:00h: call [phone]  12/22/2016 2:37 PM

## 2016-12-22 NOTE — Addendum Note (Signed)
Addended by: Jannette Spanner on: 12/22/2016 02:45 PM   Modules accepted: Orders

## 2016-12-22 NOTE — Addendum Note (Signed)
Addended by: Jannette Spanner on: 12/22/2016 02:43 PM   Modules accepted: Orders

## 2016-12-22 NOTE — Patient Instructions (Signed)
Dyspnea and respiratory abnormality Suspect is due to myotonic dystrophy Has hx of PFT restriction Currently unable to do nitiric oxide test for asthma due to dystrophy Doubt can do PFT test  Plan  do HRCT chest  Do ABG at pft lab for hypercapnia Do NIF at pft lab  [facility] Return after HRCT , abg an dNIF Return next few weeks but after above

## 2016-12-28 ENCOUNTER — Ambulatory Visit (INDEPENDENT_AMBULATORY_CARE_PROVIDER_SITE_OTHER)
Admission: RE | Admit: 2016-12-28 | Discharge: 2016-12-28 | Disposition: A | Discharge status: Home / Self Care | Payer: Medicare Other | Source: Ambulatory Visit | Attending: Internal Medicine | Admitting: Internal Medicine

## 2016-12-28 DIAGNOSIS — R0602 Shortness of breath: Secondary | ICD-10-CM | POA: Diagnosis not present

## 2016-12-28 DIAGNOSIS — R0600 Dyspnea, unspecified: Secondary | ICD-10-CM | POA: Diagnosis not present

## 2016-12-28 DIAGNOSIS — R0689 Other abnormalities of breathing: Secondary | ICD-10-CM | POA: Diagnosis not present

## 2017-01-01 ENCOUNTER — Ambulatory Visit (HOSPITAL_COMMUNITY)
Admission: RE | Admit: 2017-01-01 | Discharge: 2017-01-01 | Disposition: A | Discharge status: Home / Self Care | Payer: Medicare Other | Source: Ambulatory Visit | Attending: Internal Medicine | Admitting: Internal Medicine

## 2017-01-01 ENCOUNTER — Other Ambulatory Visit (HOSPITAL_COMMUNITY)
Admission: RE | Admit: 2017-01-01 | Discharge: 2017-01-01 | Disposition: A | Discharge status: Home / Self Care | Payer: Medicare Other | Source: Ambulatory Visit | Attending: Family Medicine | Admitting: Family Medicine

## 2017-01-01 ENCOUNTER — Encounter: Payer: Self-pay | Admitting: Family Medicine

## 2017-01-01 ENCOUNTER — Ambulatory Visit: Payer: Medicare Other | Attending: Family Medicine | Admitting: Family Medicine

## 2017-01-01 VITALS — BP 79/46 | HR 56 | Temp 97.5°F | Ht 63.0 in | Wt 109.4 lb

## 2017-01-01 DIAGNOSIS — Z124 Encounter for screening for malignant neoplasm of cervix: Secondary | ICD-10-CM | POA: Insufficient documentation

## 2017-01-01 DIAGNOSIS — G7111 Myotonic muscular dystrophy: Secondary | ICD-10-CM | POA: Insufficient documentation

## 2017-01-01 DIAGNOSIS — R531 Weakness: Secondary | ICD-10-CM | POA: Insufficient documentation

## 2017-01-01 DIAGNOSIS — R51 Headache: Secondary | ICD-10-CM | POA: Diagnosis not present

## 2017-01-01 DIAGNOSIS — D649 Anemia, unspecified: Secondary | ICD-10-CM | POA: Diagnosis not present

## 2017-01-01 DIAGNOSIS — Z13228 Encounter for screening for other metabolic disorders: Secondary | ICD-10-CM

## 2017-01-01 DIAGNOSIS — I4510 Unspecified right bundle-branch block: Secondary | ICD-10-CM | POA: Insufficient documentation

## 2017-01-01 DIAGNOSIS — R0689 Other abnormalities of breathing: Secondary | ICD-10-CM | POA: Insufficient documentation

## 2017-01-01 DIAGNOSIS — Z0000 Encounter for general adult medical examination without abnormal findings: Secondary | ICD-10-CM | POA: Diagnosis not present

## 2017-01-01 DIAGNOSIS — I452 Bifascicular block: Secondary | ICD-10-CM | POA: Diagnosis not present

## 2017-01-01 DIAGNOSIS — E785 Hyperlipidemia, unspecified: Secondary | ICD-10-CM

## 2017-01-01 DIAGNOSIS — F329 Major depressive disorder, single episode, unspecified: Secondary | ICD-10-CM | POA: Insufficient documentation

## 2017-01-01 DIAGNOSIS — R0600 Dyspnea, unspecified: Secondary | ICD-10-CM | POA: Insufficient documentation

## 2017-01-01 LAB — BLOOD GAS, ARTERIAL
Acid-Base Excess: 3.8 mmol/L — ABNORMAL HIGH (ref 0.0–2.0)
BICARBONATE: 28.8 mmol/L — AB (ref 20.0–28.0)
Drawn by: 244901
FIO2: 21
O2 Saturation: 92.1 %
PH ART: 7.364 (ref 7.350–7.450)
PO2 ART: 69.4 mmHg — AB (ref 83.0–108.0)
Patient temperature: 98.6
pCO2 arterial: 51.7 mmHg — ABNORMAL HIGH (ref 32.0–48.0)

## 2017-01-01 NOTE — Progress Notes (Signed)
Subjective:  Patient ID: Cynthia Underwood, female    DOB: 09-22-1970  Age: 46 y.o. MRN: [id number]  CC: myotonic dystrophy   HPI Cynthia Underwood presents for A complete physical exam  Past Medical History:  Diagnosis Date  . Anemia   . Bifascicular block 10/02/2014  . Depression   . Excess ear wax   . Headache   . Myotonic dystrophy, type 1 ([place])    Genetically confirmed  . 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 Watery eyes    Left  . Weakness of both legs     Past Surgical History:  Procedure Laterality Date  . BIOPSY BREAST Right   . EYE SURGERY Left 2017     No outpatient prescriptions prior to visit.   No facility-administered medications prior to visit.     ROS Review of Systems  Constitutional: Negative for activity change, appetite change and fatigue.  HENT: Negative for congestion, sinus pressure and sore throat.   Eyes: Negative for visual disturbance.  Respiratory: Negative for cough, chest tightness, shortness of breath and wheezing.   Cardiovascular: Negative for chest pain and palpitations.  Gastrointestinal: Negative for abdominal distention, abdominal pain and constipation.  Endocrine: Negative for polydipsia.  Genitourinary: Negative for dysuria and frequency.  Musculoskeletal: Positive for gait problem.  Skin: Negative for rash.  Neurological: Positive for weakness. Negative for tremors, light-headedness and numbness.  Hematological: Does not bruise/bleed easily.  Psychiatric/Behavioral: Negative for agitation and behavioral problems.    Objective:  BP (!) 79/46 (BP Location: Right Arm, Patient Position: Sitting, Cuff Size: Small)   Pulse (!) 56   Temp 97.5 F (36.4 C) (Oral)   Ht _0  (1.6 m)   Wt 109 lb 6.4 oz (49.6 kg)   LMP 12/07/2016   SpO2 97%   BMI 19.38 kg/m   BP/Weight 01/01/2017 12/22/2016 7/37/1062  Systolic BP 79 694 854  Diastolic BP 46 68 68  Wt. (Lbs) 109.4 109 112.8    BMI 19.38 19.31 19.98      Physical Exam  Constitutional: She is oriented to person, place, and time. She appears well-developed and well-nourished. No distress.  Underweight  HENT:  Head: Normocephalic.  Nose: Nose normal.  Mouth/Throat: Oropharynx is clear and moist.  Cerumen obscuring both tympanic membranes  Eyes: Conjunctivae and EOM are normal. Pupils are equal, round, and reactive to light.  Neck: Normal range of motion. No JVD present.  Cardiovascular: Regular rhythm, normal heart sounds and intact distal pulses.  Bradycardia present.  Exam reveals no gallop.   No murmur heard. Pulmonary/Chest: Effort normal and breath sounds normal. No respiratory distress. She has no wheezes. She has no rales. She exhibits no tenderness.  Abdominal: Soft. Bowel sounds are normal. She exhibits no distension and no mass. There is no tenderness.  Genitourinary:  Genitourinary Comments: Normal external genitalia, normal vagina, normal cervix, no cervical motion tenderness.  Musculoskeletal: Normal range of motion. She exhibits no edema or tenderness.  Neurological: She is alert and oriented to person, place, and time. She has normal reflexes.  Reduced muscle strength in bilateral lower extremities  Skin: Skin is warm and dry. She is not diaphoretic.  Psychiatric: She has a normal mood and affect.     Assessment & Plan:   1. Annual physical exam Has mammogram scheduled for next month. Blood pressure is on the low side; patient is asymptomatic  2. Screening for cervical cancer - Cytology - PAP [place]  3. Screening for metabolic disorder - [id number]; Future  4. Myotonic dystrophy, type 1 ([place]) - Lipid panel; Future  5. Hyperlipidemia, unspecified hyperlipidemia type - Lipid panel; Future   No orders of the defined types were placed in this encounter.   Follow-up: Return in about 6 months (around 07/03/2017) for Coordination of care.   [name] MD

## 2017-01-03 ENCOUNTER — Telehealth: Payer: Self-pay

## 2017-01-03 LAB — CYTOLOGY - PAP
ADEQUACY: ABSENT
BACTERIAL VAGINITIS: NEGATIVE
CANDIDA VAGINITIS: NEGATIVE
Chlamydia: NEGATIVE
DIAGNOSIS: NEGATIVE
HPV (WINDOPATH): NOT DETECTED
Neisseria Gonorrhea: NEGATIVE
Trichomonas: NEGATIVE

## 2017-01-03 NOTE — Telephone Encounter (Signed)
-----   Message from [name], MD sent at 01/03/2017  4:43 PM EDT ----- Pap smear is normal

## 2017-01-03 NOTE — Telephone Encounter (Signed)
Clld pt - advsd of Pap Smear results. Pt stated she understood and had no other questions.

## 2017-01-05 ENCOUNTER — Ambulatory Visit: Payer: Medicare Other | Attending: Internal Medicine

## 2017-01-05 DIAGNOSIS — G7111 Myotonic muscular dystrophy: Secondary | ICD-10-CM | POA: Diagnosis not present

## 2017-01-05 DIAGNOSIS — Z13228 Encounter for screening for other metabolic disorders: Secondary | ICD-10-CM | POA: Diagnosis not present

## 2017-01-05 DIAGNOSIS — E785 Hyperlipidemia, unspecified: Secondary | ICD-10-CM | POA: Diagnosis not present

## 2017-01-05 NOTE — Progress Notes (Signed)
Patient here for lab visit only 

## 2017-01-06 LAB — CMP14+EGFR
ALT: 14 IU/L (ref 0–32)
AST: 25 IU/L (ref 0–40)
Albumin/Globulin Ratio: 1.3 (ref 1.2–2.2)
Albumin: 3.9 g/dL (ref 3.5–5.5)
Alkaline Phosphatase: 48 IU/L (ref 39–117)
BUN/Creatinine Ratio: 19 (ref 9–23)
BUN: 13 mg/dL (ref 6–24)
Bilirubin Total: 0.3 mg/dL (ref 0.0–1.2)
CALCIUM: 9.4 mg/dL (ref 8.7–10.2)
CO2: 27 mmol/L (ref 18–29)
Chloride: 103 mmol/L (ref 96–106)
Creatinine, Ser: 0.69 mg/dL (ref 0.57–1.00)
GFR calc Af Amer: 120 mL/min/{1.73_m2} (ref >59)
GFR, EST NON AFRICAN AMERICAN: 104 mL/min/{1.73_m2} (ref >59)
GLUCOSE: 72 mg/dL (ref 65–99)
Globulin, Total: 3.1 g/dL (ref 1.5–4.5)
Potassium: 3.5 mmol/L (ref 3.5–5.2)
Sodium: 146 mmol/L — ABNORMAL HIGH (ref 134–144)
TOTAL PROTEIN: 7 g/dL (ref 6.0–8.5)

## 2017-01-06 LAB — LIPID PANEL
CHOL/HDL RATIO: 2.7 ratio (ref 0.0–4.4)
Cholesterol, Total: 184 mg/dL (ref 100–199)
HDL: 67 mg/dL (ref >39)
LDL Calculated: 104 mg/dL — ABNORMAL HIGH (ref 0–99)
TRIGLYCERIDES: 65 mg/dL (ref 0–149)
VLDL CHOLESTEROL CAL: 13 mg/dL (ref 5–40)

## 2017-01-08 ENCOUNTER — Telehealth: Payer: Self-pay | Admitting: *Deleted

## 2017-01-08 NOTE — Telephone Encounter (Signed)
-----   Message from [name], MD sent at 01/08/2017  3:51 PM EDT ----- Please inform the patient that labs are normal. Thank you.

## 2017-01-08 NOTE — Telephone Encounter (Signed)
Patient verified DOB Patient is aware of labs being normal. No further questions at this time.

## 2017-01-09 ENCOUNTER — Other Ambulatory Visit (INDEPENDENT_AMBULATORY_CARE_PROVIDER_SITE_OTHER): Payer: Medicare Other

## 2017-01-09 ENCOUNTER — Ambulatory Visit (INDEPENDENT_AMBULATORY_CARE_PROVIDER_SITE_OTHER): Payer: Medicare Other | Admitting: Neurology

## 2017-01-09 ENCOUNTER — Telehealth: Payer: Self-pay | Admitting: *Deleted

## 2017-01-09 ENCOUNTER — Encounter: Payer: Self-pay | Admitting: Neurology

## 2017-01-09 ENCOUNTER — Other Ambulatory Visit: Payer: Self-pay | Admitting: *Deleted

## 2017-01-09 VITALS — BP 100/64 | HR 87 | Ht 63.0 in | Wt 107.3 lb

## 2017-01-09 DIAGNOSIS — Z131 Encounter for screening for diabetes mellitus: Secondary | ICD-10-CM

## 2017-01-09 DIAGNOSIS — G7111 Myotonic muscular dystrophy: Secondary | ICD-10-CM

## 2017-01-09 LAB — TSH: TSH: 0.3 u[IU]/mL — ABNORMAL LOW (ref 0.35–4.50)

## 2017-01-09 LAB — VITAMIN B12: Vitamin B-12: 400 pg/mL (ref 211–911)

## 2017-01-09 LAB — T3, FREE: T3, Free: 3.3 pg/mL (ref 2.3–4.2)

## 2017-01-09 LAB — HEMOGLOBIN A1C: HEMOGLOBIN A1C: 5.7 % (ref 4.6–6.5)

## 2017-01-09 LAB — T4, FREE: FREE T4: 0.65 ng/dL (ref 0.60–1.60)

## 2017-01-09 MED ORDER — DULOXETINE HCL 30 MG PO CPEP: 30.0000 mg | ORAL_CAPSULE | Freq: Every day | ORAL | 3 refills | Status: DC

## 2017-01-09 NOTE — Telephone Encounter (Signed)
-----   Message from Alda Berthold, DO sent at 01/09/2017  3:30 PM EDT ----- Please notify patient lab are within normal limits.  Thank you.

## 2017-01-09 NOTE — Telephone Encounter (Signed)
-----   Message from Alda Berthold, DO sent at 01/09/2017  2:14 PM EDT ----- Please call the lab and see if they can add a free T3 and free T4, labs from today since her TSH was borderline low. Thanks

## 2017-01-09 NOTE — Telephone Encounter (Signed)
Labs added.

## 2017-01-09 NOTE — Telephone Encounter (Signed)
Left message notifying patient.

## 2017-01-09 NOTE — Patient Instructions (Addendum)
1.  Recommend annual eye exam 2.  Check labs 3.  Start Cymbalta 30mg  daily 4.  Return to clinic 6 months

## 2017-01-09 NOTE — Progress Notes (Signed)
Follow-up Visit   Date: 01/09/17    Cynthia Underwood MRN: [id number] DOB: 01/05/1971   Interim History: Cynthia Underwood is a 46 y.o. right-handed African American female with myotonic dystrophy type I and hyperlipidemia returning to the clinic for follow-up of myotonic dystrophy type I.  The patient was accompanied to the clinic by self.  History of present illness: Starting around the age of 27, she started having lock jaw and stiffness of the hands. Over the years, she developed worsening stiffness of her fingers and hands. Because her maternal aunt, who is also a patient of mine, had known myotonic dystrophy, she ultimately was genetically tested at the age of 74 which confirmed the diagnosis of myotonic dystrophy type 1. She has a strong family history of DM1 including maternal aunt, cousins x 2, and younger sister. She does not have any children and has no future plans for pregnancy.  Symptoms were relatively stable until her mid-30s and then started developing fatigue, weakness, daytime sleepiness, and shortness of breath with exertion. She is seeing Dr. Radford Pax in cardiology for surveillance. She walks independently but was told previously told to use leg braces.  Over the past few years, she noticed intermittent difficulty swallowing liquids. She underwent barium swallow which reportedly showed signs of aspiration and she was recommended to use a straw and use chin tuck position.   She was seeing several neurologists over the years at [place] in [place]. She is not working and has been on disability since 2010.  She established care with me in February 2016.  She had significant leg weakness where she had to lift her left leg out of the car because it is so weak.  She feels that she no longer has any good days, they are either "bad or worse".  She also had new biparietal throbbing pain and responded to topiramate 25mg  daily.  In fall of 2016, she had to move  her bedroom to the [place] because of difficulty climbing stairs.  Since completing home PT/OT, her muscle strength has improved and sometimes, she is able to walk without AFO.   She was started in Cymbalta 30mg  for mood, fatigue and myalgias which significantly helped.    UPDATE 04/26/2016: She stopped taking Cymbalta in June because mood improved.  She continues to have myalgias, but is it manageable off medications. She usually drinks 2 cans of ensure daily and has gained 12lb since her last visit in February.  She has not had any interval falls.  She has now moved her bedroom back upstairs and is doing better managing her stairs.  Her biggest concern is increased frequency of muscle stiffness and cramping of the hands.    UPDATE 01/09/2017:  She reports having more fatigue and difficulty with her balance.  She has more bad days than good days. She was told by PT that she has hip weakness which limits her balance.  She is not compliant with home exercises because she has muscle pain which persists afterwards.  She continues to have mild dysarthria and dysphagia, denies any choking spells.  She continues to be followed by cardiology for abnormal EKG with RBBB and LAFB as well as myocardial thinning.  She is also undergoing pulmonology work-up for dyspnea, which is  suggestive of restrictive changes, consistent with muscular weakness due to DM1.  She complains of blurry vision and has not followed with an eye doctor in quite some time.  Mood has been down.  Medications:  No current outpatient prescriptions on file prior to visit.   No current facility-administered medications on file prior to visit.     Allergies:  Allergies  Allergen Reactions  . Penicillins Nausea And Vomiting, Rash and Other (See Comments)    fever    Review of Systems:  CONSTITUTIONAL: No fevers, chills, night sweats, or weight loss.  EYES: No visual changes or eye pain ENT: No hearing changes.  No history of nose bleeds.     RESPIRATORY: No cough, wheezing and shortness of breath.   CARDIOVASCULAR: Negative for chest pain, and palpitations.   GI: Negative for abdominal discomfort, blood in stools or black stools.  No recent change in bowel habits.   GU:  No history of incontinence.   MUSCLOSKELETAL: No history of joint pain or swelling.  No myalgias.   SKIN: Negative for lesions, rash, and itching.   ENDOCRINE: Negative for cold or heat intolerance, polydipsia or goiter.   PSYCH:  +depression or anxiety symptoms.   NEURO: As Above.   Vital Signs:  BP 100/64   Pulse 87   Ht 5\' 3"  (1.6 m)   Wt 107 lb 5 oz (48.7 kg)   SpO2 97%   BMI 19.01 kg/m   Neurological Exam: MENTAL STATUS including orientation to time, place, person, recent and remote memory, attention span and concentration, language, and fund of knowledge is normal.  Speech is mildly dysarthric and hypophonic.  Mood is down and she does not engage in conversation as much today.  CRANIAL NERVES: Pupils equal round and reactive to light.  Bilateral lens opacity is present. Normal conjugate, extra-ocular eye movements in all directions of gaze. Mild bilateral ptosis. Typical mytonic facies with temporal wasting and transverse smile. Mild facial weakness as noted frontalis and oribicularis oculi 5-/5, unable to keep air in cheeks, oribicularis oris 4/5.  Palate elevates symmetrically.  Tongue is midline.  MOTOR:  Generalized loss of muscle bulk. Mild grip myotonia bilaterally. No percussion myotonia.Tone is normal.  Neck flexion is 5-/5.  Right Upper Extremity:    Left Upper Extremity:    Deltoid  5-/5   Deltoid  5-/5   Biceps  5-/5   Biceps  5-/5   Triceps  5-/5   Triceps  5-/5   Wrist extensors  5/5   Wrist extensors  5/5   Wrist flexors  5/5   Wrist flexors  5/5   Finger extensors  4/5   Finger extensors  4/5   Finger flexors  4/5   Finger flexors  4/5   Dorsal interossei  4/5   Dorsal interossei  4/5   Abductor pollicis  4/5   Abductor  pollicis  4/5   Tone (Ashworth scale)  0  Tone (Ashworth scale)  0   Right Lower Extremity:    Left Lower Extremity:    Hip flexors  4/5   Hip flexors  4/5   Hip extensors  4/5   Hip extensors  4/5   Knee flexors  5/5   Knee flexors  5/5   Knee extensors  5/5   Knee extensors  5/5   Dorsiflexors  4/5   Dorsiflexors  4/5   Plantarflexors  5-/5   Plantarflexors  5-/5   Toe extensors  5-/5   Toe extensors  5-/5   Toe flexors  5-/5   Toe flexors  5-/5   Tone (Ashworth scale)  0  Tone (Ashworth scale)  0    COORDINATION/GAIT:   Gait is normal.  She is more unstable walking on heels and toes as compared to previously. She has some difficulty getting up a low chair without using arms to push off, but is able to complete.   Data: Echocardiogram 10/08/2014:  Normal   Lab Results  Component Value Date   HGBA1C 5.3 07/09/2015   Lab Results  Component Value Date   TSH 0.41 07/09/2015   Lab Results  Component Value Date   VITAMINB12 419 07/09/2015   Lab Results  Component Value Date   FOLATE 5.4 10/16/2014    IMPRESSION/PLAN: 1.  Myotonic dystrophy type I, genetically confirmed and with strong family history.  She has marked myotonic facies and generalized weakness, especially in the lower extremities.  Her proximal strength is worse than before.  Likely due to deconditioning and lack of compliance with PT.  She does not wish to restart physical therapy.  I emphasized the importance of doing her home leg strengthening exercises and using AFOs as this will help with balance and gait.  She is due for annual diabetes screening - check HbA1c She is overdue for annual eye exam and has early cataracts which has not been followed She continues to be followed by cardiology, whose care is appreciated. She has abnormal EKG with RBBB and LAFB as well as myocardial thinning She is undergoing pulmonology work-up for dyspnea, which prelim studies are suggestive of restrictive changes, consistent with  muscular weakness due to DM1   2.  Myalgia, depression, and fatigue, worsening.  Restart Cymbalta 30mg  daily.  Check TSH and vitamin B12  3. Tobacco cessation encouraged  Return to clinic in 6 months   The duration of this appointment visit was 25 minutes of face-to-face time with the patient.  Greater than 50% of this time was spent in counseling, explanation of diagnosis, planning of further management, and coordination of care.   Thank you for allowing me to participate in patient's care.  If I can answer any additional questions, I would be pleased to do so.    Sincerely,    Alaine Loughney K. Posey Pronto, DO

## 2017-01-16 ENCOUNTER — Ambulatory Visit: Payer: Medicare Other | Admitting: Cardiology

## 2017-01-17 ENCOUNTER — Telehealth: Payer: Self-pay | Admitting: Internal Medicine

## 2017-01-17 ENCOUNTER — Encounter: Payer: Self-pay | Admitting: Internal Medicine

## 2017-01-17 ENCOUNTER — Ambulatory Visit (INDEPENDENT_AMBULATORY_CARE_PROVIDER_SITE_OTHER): Payer: Medicare Other | Admitting: Internal Medicine

## 2017-01-17 VITALS — BP 114/68 | HR 97 | Ht 63.0 in | Wt 102.2 lb

## 2017-01-17 DIAGNOSIS — R0600 Dyspnea, unspecified: Secondary | ICD-10-CM | POA: Diagnosis not present

## 2017-01-17 DIAGNOSIS — R0689 Other abnormalities of breathing: Secondary | ICD-10-CM | POA: Diagnosis not present

## 2017-01-17 LAB — NITRIC OXIDE: NITRIC OXIDE: 15

## 2017-01-17 NOTE — Assessment & Plan Note (Addendum)
No evidence of asthma No evidence of lung tissue disease Shortness of breath likely due to neuromuscular issues basd on ABG but not certain.   Plan [facility] with Serita [facility]cher Will also discuss with Dr Posey Pronto Neuromuscular specialist  Will ask RT to provide NIF result  Followup 2 weeks after stress test with myself or APP Tammy Parrett but might cancel dependong on Dr Serita Grit opinin

## 2017-01-17 NOTE — Telephone Encounter (Signed)
Hi Donnika : I got referred [employer].  for dyspnea. HEr aBG shows hypercapnia. How bad is her neuromuscular disease? Do I need to pursue a pulmnary stress test or is the ABG good enough for you ?  Hi ELise: when is CPST? Also please call Resp therapy dept and get me the NIF value ASAP  Thanks  Dr. Brand Males, M.D., [facility].C.P Pulmonary and Critical Care Medicine Staff Physician [place] [facility] Pager: [phone], If no answer or between  15:00h - 7:00h: call [phone]  01/17/2017 6:29 PM

## 2017-01-17 NOTE — Progress Notes (Addendum)
Subjective:     Patient ID: Cynthia Underwood, female   DOB: [birth year], 46 y.o.   MRN: [id number]  HPI HPI   OV 12/22/2016  Chief Complaint  Patient presents with  . pulmonary consult    refered by Dr Radford Pax for SOB. no chest pain. pt c/o coughing non product   46 year old African-American female. 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 She's had this since age 22. Her current features include lockjaw and stiffness of the hands and fingersassociated with fatigue, weakness, daytime sleepiness or shortness of breath. She walks independently but has been told to use braces. She has lower extremity weakness as well particularly proximal muscles.Over the last few years she's also had dysphagia for which she uses a chin tuck approach. She sees Dr. Posey Pronto in the neurology clinic  She has now been referred to the pulmonary clinic because of her shortness of breath. She tells me that she's been dyspneic for over 20 years. For the last few years it has been progressive. It is moderate to severe in intensity. It is associated with exertion relieved by rest. Activities of daily living can make her dyspneic. Is also orthopnea sometimes. There is some cough associated but no wheezing or sputum production. There is no associated chest pain or diaphoresis or weight loss. She believes she does not have sarcoidosis.  04/03/2011: Pulmonary function test done at [facility] shows uniform restriction with low DLCO 50s%. Personally reviewed the trace and visualized it 05/16/2014: Hemoglobin is 12.8 g percent 10/02/2014: Angiotensin-converting enzyme was normal 06/05/2016: Creatinine is normal 0.6 mg percent 10/26/2016 pregnancy test normal 10/26/2016: Myocardial perfusion PET scan: Negative for uptake for sarcoid 12/11/2016: Echocardiogram is normal  12/22/2016 : walking desat test  with forehead probe on room air: did not desat. Pulse ox stayed > 95%. HR 70s to 84/min Stopped a fe times due to dyspnea and fatigue and neuromuscular imbalance  12/22/2016 - feno for asthma: could not do due to difficulty with maneuvers   OV 01/17/2017  Chief Complaint  Patient presents with  . Follow-up    follow up after CT scan. States breathing has been ok since last visit.     FU unexplained dyspnea with neuromuscular disease   She has unexplained dyspnea. She is here to follow-up for her CT scan results. The CT scan results high-resolution CT chest is normal. Today she was able to complete the exhaled nitric oxide testing and this was normal too. She has no new complaints. She thinks she'll be able to do a pulmonary stress test on the bike   ABG Results for Cynthia Underwood, Cynthia Underwood (MRN [id number]) as of 01/17/2017 18:25  Ref. Range 01/01/2017 13:49  FIO2 Unknown 21.00  pH, Arterial Latest Ref Range: 7.350 - 7.450  7.364  pCO2 arterial Latest Ref Range: 32.0 - 48.0 mmHg 51.7 (H)  pO2, Arterial Latest Ref Range: 83.0 - 108.0 mmHg 69.4 (L)    IMPRESSION:  CT CHEST 1. No evidence of of interstitial lung disease. 2. No acute findings in the thorax to account for the patient's symptoms.   Electronically Signed   By: Vinnie Langton M.D.   On: 12/28/2016 11:02   FeNO 15ppb and normal 01/17/2017    has a past medical history of Anemia; Bifascicular block (10/02/2014); Depression; Excess ear wax; Headache; Myotonic dystrophy, type 1 (HCC); RBBB (right 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) (  10/02/2014); Watery eyes; and Weakness of both legs.   reports that she quit smoking about 4 months ago. Her smoking use included Cigarettes. She has a 13.00 pack-year smoking history. She has never used smokeless tobacco.  Past Surgical History:  Procedure Laterality Date  . BIOPSY BREAST Right   . EYE SURGERY Left 2017    Allergies  Allergen Reactions  . Penicillins  Nausea And Vomiting, Rash and Other (See Comments)    fever    Immunization History  Administered Date(s) Administered  . Influenza,inj,Quad PF,36+ Mos 08/11/2014, 07/01/2015    Family History  Problem Relation Age of Onset  . Cancer Father 37       Deceased  . Heart disease Father   . COPD Mother   . Diabetes Maternal Uncle   . Heart disease Maternal Uncle   . Heart disease Paternal Grandmother   . Diabetes Paternal Grandmother   . Hypertension Paternal Grandmother   . Neuromuscular disorder Maternal Aunt        Myotonic dystrophy  . Neuromuscular disorder Cousin        Myotonic dystrophy  . Neuromuscular disorder Sister        Myotonic dystrophy     Current Outpatient Prescriptions:  .  DULoxetine (CYMBALTA) 30 MG capsule, Take 1 capsule (30 mg total) by mouth daily., Disp: 90 capsule, Rfl: 3   Review of Systems     Objective:   Physical Exam Vitals:   01/17/17 1008  BP: 114/68  Pulse: 97  SpO2: 91%  Weight: 102 lb 3.2 oz (46.4 kg)  Height: 5\' 3"  (1.6 m)   Body mass index is 18.1 kg/m.  Discussion visit    Assessment:       ICD-9-CM ICD-10-CM   1. Dyspnea and respiratory abnormality 786.09 R06.00 Nitric oxide    R06.89   2. Dyspnea and respiratory abnormality 786.09 R06.00 Nitric oxide    R06.89        Plan:     Dyspnea and respiratory abnormality No evidence of asthma No evidence of lung tissue disease Shortness of breath likely due to neuromuscular issues basd on ABG but not certain.   Plan Pulmonary [facility] with Serita [facility]cher Will also discuss with Dr Posey Pronto Neuromuscular specialist  Will ask RT to provide NIF result  Followup 2 weeks after stress test with myself or APP Tammy Parrett but might cancel dependong on Dr Serita Grit opinin    (> 50% of this 15 min visit spent in face to face counseling or/and coordination of care)   Dr. Brand Males, M.D., [facility].C.P Pulmonary and Critical Care Medicine Staff  Physician [place] [facility] Pager: [phone], If no answer or between  15:00h - 7:00h: call [phone]  01/17/2017 10:52 AM

## 2017-01-17 NOTE — Patient Instructions (Signed)
ICD-9-CM ICD-10-CM   1. Dyspnea and respiratory abnormality 786.09 R06.00 Nitric oxide    R06.89   2. Dyspnea and respiratory abnormality 786.09 R06.00 Nitric oxide    R06.89    Dyspnea and respiratory abnormality No evidence of asthma No evidence of lung tissue disease Shortness of breath likely due to neuromuscular issues but not certain.   Plan Pulmonary [facility] with [facility]  Followup 2 weeks after stress test with myself or APP Tammy Parrett

## 2017-01-18 ENCOUNTER — Ambulatory Visit
Admission: RE | Admit: 2017-01-18 | Discharge: 2017-01-18 | Disposition: A | Discharge status: Home / Self Care | Payer: Medicare Other | Source: Ambulatory Visit | Attending: Nurse Practitioner | Admitting: Nurse Practitioner

## 2017-01-18 DIAGNOSIS — Z1231 Encounter for screening mammogram for malignant neoplasm of breast: Secondary | ICD-10-CM | POA: Diagnosis not present

## 2017-01-18 NOTE — Telephone Encounter (Signed)
Hi Cynthia Underwood (cc to [facility] my RN and my triage)  1. Ok we will cancel CPST  2. Hypercapnia is worrisome. You think she can handle biPAP at night without aerophagia?   Thanks  Dr. Brand Males, M.D., [facility].C.P Pulmonary and Critical Care Medicine Staff Physician [place] [facility] Pager: [phone], If no answer or between  15:00h - 7:00h: call [phone]  01/18/2017 9:14 AM

## 2017-01-18 NOTE — Telephone Encounter (Signed)
MR please advise if a sleep study needs to be scheduled as well. Thanks.

## 2017-01-18 NOTE — Telephone Encounter (Signed)
Yes, I think biPAP would be a good option for her.  Clarise Chacko K. Posey Pronto, DO

## 2017-01-18 NOTE — Telephone Encounter (Signed)
Hi [name], her she has moderate bulbar and generalized weakness due to her myotonic dystrophy.  I am not sure that she will be able to complete pulmonary stress test due to her leg weakness and fatigue.  I am okay with ABG only, as long as it will provide enough information from a pulmonary stand-point to guide management.   Appreciate your help in her care. Cynthia Underwood

## 2017-01-19 NOTE — Telephone Encounter (Signed)
Patient has neuromuscular diseasee withchronic hypercapnia. So, try to get bipap with just that  Dr. Brand Males, M.D., [facility].C.P Pulmonary and Critical Care Medicine Staff Physician [place] [facility] Pager: [phone], If no answer or between  15:00h - 7:00h: call [phone]  01/19/2017 8:27 AM

## 2017-01-19 NOTE — Telephone Encounter (Signed)
CPST has been cancelled. RT is closed, will call on Monday 5/21.   MR please advise on settings of Bipap. Thanks.

## 2017-01-25 ENCOUNTER — Encounter (HOSPITAL_COMMUNITY): Payer: Medicare Other

## 2017-01-25 NOTE — Telephone Encounter (Signed)
Will also need a pressure support number as well before we can order this test.   Also, insurance will typically not cover a bipap for a patient without an ABG or sleep study.    Please advise on pressure support so bipap can be ordered.  Thanks.

## 2017-01-25 NOTE — Telephone Encounter (Signed)
Bipap 10/5 QHS   Dr. Brand Males, M.D., [facility].C.P Pulmonary and Critical Care Medicine Staff Physician [place] [facility] Pager: [phone], If no answer or between  15:00h - 7:00h: call [phone]  01/25/2017 3:58 PM

## 2017-01-25 NOTE — Telephone Encounter (Signed)
bipap - indication is  - Myotonic Dystrophy, type 1  -  chronic hypercapnic respiratory failure  Dr. Brand Males, M.D., [facility].C.P Pulmonary and Critical Care Medicine Staff Physician [place] [facility] Pager: [phone], If no answer or between  15:00h - 7:00h: call [phone]  01/25/2017 4:26 PM

## 2017-01-25 NOTE — Telephone Encounter (Signed)
LM for RT department for NIF value  Dr Chase Caller please advise, is this BiPAP start based off of any results? ABG results?

## 2017-01-26 NOTE — Telephone Encounter (Signed)
BiPAP pressure is 10/5 that is the pressure setting The ABg is in the system but here it is    Results for Cynthia Underwood, Cynthia Underwood (MRN [id number]) as of 01/26/2017 14:09  Ref. Range 01/01/2017 13:49  Sample type Unknown ARTERIAL DRAW  Delivery systems Unknown ROOM AIR  FIO2 Unknown 21.00  pH, Arterial Latest Ref Range: 7.350 - 7.450  7.364  pCO2 arterial Latest Ref Range: 32.0 - 48.0 mmHg 51.7 (H)  pO2, Arterial Latest Ref Range: 83.0 - 108.0 mmHg 69.4 (L)  Acid-Base Excess Latest Ref Range: 0.0 - 2.0 mmol/L 3.8 (H)  Bicarbonate Latest Ref Range: 20.0 - 28.0 mmol/L 28.8 (H)  O2 Saturation Latest Units: % 92.1  Patient temperature Unknown 98.6  Collection site Unknown RIGHT RADIAL  Allens test (pass/fail) Latest Ref Range: PASS  PASS    Dr. Brand Males, M.D., [facility].C.P Pulmonary and Critical Care Medicine Staff Physician [place] [facility] Pager: [phone], If no answer or between  15:00h - 7:00h: call [phone]  01/26/2017 2:09 PM

## 2017-01-26 NOTE — Telephone Encounter (Signed)
MR for a bipap to be ordered we must provide a max ipap setting, min epap setting, and pressure support setting.   Please advise. Thanks!

## 2017-01-30 ENCOUNTER — Ambulatory Visit (INDEPENDENT_AMBULATORY_CARE_PROVIDER_SITE_OTHER): Payer: Medicare Other | Admitting: Internal Medicine

## 2017-01-30 ENCOUNTER — Encounter: Payer: Self-pay | Admitting: Internal Medicine

## 2017-01-30 VITALS — BP 100/54 | HR 54 | Ht 63.0 in | Wt 103.0 lb

## 2017-01-30 DIAGNOSIS — I452 Bifascicular block: Secondary | ICD-10-CM | POA: Diagnosis not present

## 2017-01-30 DIAGNOSIS — Z01812 Encounter for preprocedural laboratory examination: Secondary | ICD-10-CM

## 2017-01-30 DIAGNOSIS — G7111 Myotonic muscular dystrophy: Secondary | ICD-10-CM | POA: Diagnosis not present

## 2017-01-30 NOTE — Patient Instructions (Addendum)
Medication Instructions: Your physician recommends that you continue on your current medications as directed. Please refer to the Current Medication list given to you today.   Labwork: Your physician recommends that you have pre procedural lab work today: CBC, BMET, PT/INR   Procedures/Testing: Your physician has recommended that you have a pacemaker inserted. A pacemaker is a small device that is placed under the skin of your chest or abdomen to help control abnormal heart rhythms. This device uses electrical pulses to prompt the heart to beat at a normal rate. Pacemakers are used to treat heart rhythms that are too slow. Wire (leads) are attached to the pacemaker that goes into the chambers of you heart. This is done in the hospital and usually requires and overnight stay. Please see the instruction sheet given to you today for more information.    Follow-Up: Your physician recommends that you schedule a follow-up appointment in 10 - 14 days from 02/12/17 with the [facility] for wound check  Your physician recommends that you schedule a follow-up appointment in 3 months from 02/12/17 with Dr. Caryl Comes    Any Additional Special Instructions Will Be Listed Below (If Applicable).     If you need a refill on your cardiac medications before your next appointment, please call your pharmacy.

## 2017-01-30 NOTE — Progress Notes (Signed)
Medical     ELECTROPHYSIOLOGY CONSULT NOTE  Patient ID: Cynthia Underwood, MRN: [id number], DOB/AGE: [birth year] 46 y.o. Admit date: (Not on file) Date of Consult: 01/30/2017  Primary Physician: [name], MD Primary Cardiologist: TT   Cynthia Underwood is being seen today for the evaluation of myotonic dystrophy and heart block  at the request of  Turner, Eber Hong, MD.   HPI Cynthia Underwood is a 46 y.o. 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  She has had lifelong symptoms, but the diagnosis was only made about 10 years ago.   she's had an isolated episode of syncope with a long prodrome. She has had more recent brief episodes of lightheadedness.    she was referred to pulmonary because of shortness of breath. It is felt to be related to neuromuscular weakness.  Cardiac evaluation has been extensive. MRI scanning >>> late gadolinium enhancement in the inferior and inferolateral walls.   FDG-PET >>> no uptake and hence not consistent with sarcoid  No evidence of LV dilitation  FamHx involves paternal lineage with aunts    Past Medical History:  Diagnosis Date  . Anemia   . Bifascicular block 10/02/2014  . Depression   . Excess ear wax   . Headache   . 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 Watery eyes    Left  . Weakness of both legs       Surgical History:  Past Surgical History:  Procedure Laterality Date  . BIOPSY BREAST Right   . EYE SURGERY Left 2017     Home Meds: Prior to Admission medications   Medication Sig Start Date End Date Taking? Authorizing Provider  DULoxetine (CYMBALTA) 30 MG capsule Take 1 capsule (30 mg total) by mouth daily. 01/09/17  Yes Narda Amber K, DO    Allergies:  Allergies  Allergen Reactions  . Penicillins Nausea And Vomiting, Rash and Other (See Comments)    fever    Social History    Social History  . Marital status: Single    Spouse name: N/A  . Number of children: 0  . Years of education: 1.5 Collge   Occupational History  . Unemployed Other   Social History Main Topics  . Smoking status: Former Smoker    Packs/day: 0.50    Years: 26.00    Types: Cigarettes    Quit date: 09/2016  . Smokeless tobacco: Never Used  . Alcohol use 0.0 oz/week     Comment: Rarely only holidays  . Drug use: Yes    Frequency: 2.0 times per week    Types: Marijuana     Comment: month ago  . Sexual activity: Yes    Birth control/ protection: None, Condom   Other Topics Concern  . Not on file   Social History Narrative   Patient lives at home with her aunt.   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   She has been on disability since 2010.   Education: 1 1/2 years of college.        Family History  Problem Relation Age of Onset  . Cancer Father 77       Deceased  . Heart disease Father   . COPD Mother   . Diabetes Maternal Uncle   . Heart disease Maternal  Uncle   . Heart disease Paternal Grandmother   . Diabetes Paternal Grandmother   . Hypertension Paternal Grandmother   . Neuromuscular disorder Maternal Aunt        Myotonic dystrophy  . Neuromuscular disorder Cousin        Myotonic dystrophy  . Neuromuscular disorder Sister        Myotonic dystrophy     ROS:  Please see the history of present illness.     All other systems reviewed and negative.    Physical Exam:  Blood pressure (!) 100/54, pulse (!) 54, height 5\' 3"  (1.6 m), weight 103 lb (46.7 kg), SpO2 98 %. General: Well developed, well nourished tiny  female in no acute distress. Head: Normocephalic, atraumatic, sclera non-icteric, no xanthomas, nares are without discharge. EENT: normal  Lymph Nodes:  none Neck: Negative for carotid bruits. JVD not elevated. Back:without scoliosis kyphosis  Lungs: Clear bilaterally to auscultation without  wheezes, rales, or rhonchi. Breathing is unlabored. Heart: RRR with S1 S2. No   murmur . No rubs, or gallops appreciated. Abdomen: Soft, non-tender, non-distended with normoactive bowel sounds. No hepatomegaly. No rebound/guarding. No obvious abdominal masses. Msk:  Strength and tone appear normal for age. Extremities: No clubbing or cyanosis. No edema.  Distal pedal pulses are 2+ and equal bilaterally. Skin: Warm and Dry Neuro: Alert and oriented X 3. CN III-XII intact Grossly normal sensory and motor function . Flat facies  Psych:  Responds to questions appropriately with a normal affect.      Labs: Cardiac Enzymes No results for input(s): CKTOTAL, CKMB, TROPONINI in the last 72 hours. CBC Lab Results  Component Value Date   WBC 5.0 05/16/2014   HGB 12.8 05/16/2014   HCT 39.3 05/16/2014   MCV 95.4 05/16/2014   PLT 307 05/16/2014   PROTIME: No results for input(s): LABPROT, INR in the last 72 hours. Chemistry No results for input(s): NA, K, CL, CO2, BUN, CREATININE, CALCIUM, PROT, BILITOT, ALKPHOS, ALT, AST, GLUCOSE in the last 168 hours.  Invalid input(s): LABALBU Lipids Lab Results  Component Value Date   CHOL 184 01/05/2017   HDL 67 01/05/2017   LDLCALC 104 (H) 01/05/2017   TRIG 65 01/05/2017   BNP No results found for: PROBNP Thyroid Function Tests: No results for input(s): TSH, T4TOTAL, T3FREE, THYROIDAB in the last 72 hours.  Invalid input(s): FREET3 Miscellaneous No results found for: DDIMER  Radiology/Studies:  Mm Digital Screening Bilateral  Result Date: 01/19/2017 CLINICAL DATA:  Screening. EXAM: DIGITAL SCREENING BILATERAL MAMMOGRAM WITH CAD COMPARISON:  Previous exam(s). ACR Breast Density Category d: The breast tissue is extremely dense, which lowers the sensitivity of mammography. FINDINGS: There are no findings suspicious for malignancy. Images were processed with CAD. IMPRESSION: No mammographic evidence of malignancy. A result letter of this screening  mammogram will be mailed directly to the patient. RECOMMENDATION: Screening mammogram in one year. (Code:SM-B-01Y) BI-RADS CATEGORY  1: Negative. Electronically Signed   By: Everlean Alstrom M.D.   On: 01/19/2017 15:59    EKG: Sinus rhythm at 54 Intervals 15/14/48 Axis left -54  Event recorder Personally reviewed  2-1 heart block developed during daylight hours.  Assessment and Plan:  Syncope  Myotonic dystrophy  First-degree AV block-intermittent  2:1 second-degree heart block  Dyspnea    The patient has myotonic dystrophy with progressive conduction system disease with a 21 heart block and intermittent first-degree block. This is a class I indication for pacing. Her syncopal event sounds as if it were  neurally mediated but it could've been bradycardia mediated as well. This further augments the importance of pacing. I do not think that there is an indication for EP testing to look at [facility] intervals  The benefits and risks were reviewed including but not limited to death,  perforation, infection, lead dislodgement and device malfunction.  The patient understands agrees and is willing to proceed.      Virl Axe

## 2017-01-31 LAB — BASIC METABOLIC PANEL
BUN / CREAT RATIO: 18 (ref 9–23)
BUN: 12 mg/dL (ref 6–24)
CHLORIDE: 102 mmol/L (ref 96–106)
CO2: 26 mmol/L (ref 18–29)
CREATININE: 0.65 mg/dL (ref 0.57–1.00)
Calcium: 10.1 mg/dL (ref 8.7–10.2)
GFR calc Af Amer: 122 mL/min/{1.73_m2} (ref >59)
GFR calc non Af Amer: 106 mL/min/{1.73_m2} (ref >59)
Glucose: 92 mg/dL (ref 65–99)
POTASSIUM: 4.5 mmol/L (ref 3.5–5.2)
Sodium: 146 mmol/L — ABNORMAL HIGH (ref 134–144)

## 2017-01-31 LAB — CBC WITH DIFFERENTIAL/PLATELET
BASOS ABS: 0 10*3/uL (ref 0.0–0.2)
Basos: 0 %
EOS (ABSOLUTE): 0.1 10*3/uL (ref 0.0–0.4)
Eos: 1 %
HEMOGLOBIN: 13.3 g/dL (ref 11.1–15.9)
Hematocrit: 39.5 % (ref 34.0–46.6)
Immature Grans (Abs): 0 10*3/uL (ref 0.0–0.1)
Immature Granulocytes: 0 %
LYMPHS ABS: 2.9 10*3/uL (ref 0.7–3.1)
Lymphs: 48 %
MCH: 30.6 pg (ref 26.6–33.0)
MCHC: 33.7 g/dL (ref 31.5–35.7)
MCV: 91 fL (ref 79–97)
MONOCYTES: 4 %
Monocytes Absolute: 0.3 10*3/uL (ref 0.1–0.9)
Neutrophils Absolute: 2.8 10*3/uL (ref 1.4–7.0)
Neutrophils: 47 %
PLATELETS: 275 10*3/uL (ref 150–379)
RBC: 4.35 x10E6/uL (ref 3.77–5.28)
RDW: 15.3 % (ref 12.3–15.4)
WBC: 6.1 10*3/uL (ref 3.4–10.8)

## 2017-01-31 LAB — PROTIME-INR
INR: 1.1 (ref 0.8–1.2)
Prothrombin Time: 11.2 s (ref 9.1–12.0)

## 2017-02-04 ENCOUNTER — Emergency Department (HOSPITAL_COMMUNITY)
Admission: EM | Admit: 2017-02-04 | Discharge: 2017-02-04 | Disposition: A | Discharge status: Home / Self Care | Payer: Medicare Other | Attending: Emergency Medicine | Admitting: Emergency Medicine

## 2017-02-04 ENCOUNTER — Emergency Department (HOSPITAL_COMMUNITY): Payer: Medicare Other

## 2017-02-04 ENCOUNTER — Encounter (HOSPITAL_COMMUNITY): Payer: Self-pay

## 2017-02-04 DIAGNOSIS — Z87891 Personal history of nicotine dependence: Secondary | ICD-10-CM | POA: Diagnosis not present

## 2017-02-04 DIAGNOSIS — R0789 Other chest pain: Secondary | ICD-10-CM | POA: Diagnosis not present

## 2017-02-04 DIAGNOSIS — R079 Chest pain, unspecified: Secondary | ICD-10-CM | POA: Diagnosis not present

## 2017-02-04 LAB — BASIC METABOLIC PANEL
Anion gap: 11 (ref 5–15)
BUN: 13 mg/dL (ref 6–20)
CALCIUM: 10 mg/dL (ref 8.9–10.3)
CHLORIDE: 107 mmol/L (ref 101–111)
CO2: 24 mmol/L (ref 22–32)
CREATININE: 0.58 mg/dL (ref 0.44–1.00)
GFR calc non Af Amer: >60 mL/min (ref >60)
Glucose, Bld: 109 mg/dL — ABNORMAL HIGH (ref 65–99)
Potassium: 4.2 mmol/L (ref 3.5–5.1)
Sodium: 142 mmol/L (ref 135–145)

## 2017-02-04 LAB — CBC
HCT: 40 % (ref 36.0–46.0)
Hemoglobin: 12.7 g/dL (ref 12.0–15.0)
MCH: 29.9 pg (ref 26.0–34.0)
MCHC: 31.8 g/dL (ref 30.0–36.0)
MCV: 94.1 fL (ref 78.0–100.0)
PLATELETS: 278 10*3/uL (ref 150–400)
RBC: 4.25 MIL/uL (ref 3.87–5.11)
RDW: 14.5 % (ref 11.5–15.5)
WBC: 7.9 10*3/uL (ref 4.0–10.5)

## 2017-02-04 LAB — D-DIMER, QUANTITATIVE: D-Dimer, Quant: <0.27 ug/mL-FEU (ref 0.00–0.50)

## 2017-02-04 LAB — I-STAT TROPONIN, ED: TROPONIN I, POC: 0.01 ng/mL (ref 0.00–0.08)

## 2017-02-04 MED ORDER — ONDANSETRON HCL 4 MG/2ML IJ SOLN
4.0000 mg | Freq: Once | INTRAMUSCULAR | Status: AC
  Administered 2017-02-04: 4 mg via INTRAVENOUS
  Filled 2017-02-04: qty 2

## 2017-02-04 MED ORDER — HYDROMORPHONE HCL 1 MG/ML IJ SOLN
1.0000 mg | Freq: Once | INTRAMUSCULAR | Status: AC
  Administered 2017-02-04: 1 mg via INTRAVENOUS
  Filled 2017-02-04: qty 1

## 2017-02-04 MED ORDER — IBUPROFEN 800 MG PO TABS: 800.0000 mg | ORAL_TABLET | Freq: Three times a day (TID) | ORAL | 0 refills | Status: DC

## 2017-02-04 MED ORDER — LORAZEPAM 2 MG/ML IJ SOLN
1.0000 mg | Freq: Once | INTRAMUSCULAR | Status: AC
  Administered 2017-02-04: 1 mg via INTRAVENOUS
  Filled 2017-02-04: qty 1

## 2017-02-04 NOTE — ED Provider Notes (Signed)
[place] DEPT Provider Note   CSN: [id number] Arrival date & time: 02/04/17  1139     History   Chief Complaint Chief Complaint  Patient presents with  . Chest Pain    HPI Cynthia Underwood is a 46 y.o. female.  Patient complains of right-sided chest pain. No shortness of breath. No sweating.   The history is provided by the patient. No language interpreter was used.  Chest Pain   This is a new problem. The current episode started 3 to 5 hours ago. The problem occurs constantly. The problem has not changed since onset.The pain is associated with movement. The pain is present in the lateral region. The pain is at a severity of 5/10. The pain is moderate. The pain does not radiate. Pertinent negatives include no abdominal pain, no back pain, no cough and no headaches.  Pertinent negatives for past medical history include no seizures.    Past Medical History:  Diagnosis Date  . Anemia   . Bifascicular block 10/02/2014  . Depression   . Excess ear wax   . Headache   . 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 Watery eyes    Left  . Weakness of both legs     Patient Active Problem List   Diagnosis Date Noted  . Heart palpitations 05/21/2016  . Dyspnea and respiratory abnormality 10/02/2014  . 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 Infection due to trichomonas vaginalis 09/15/2014  . Myotonic dystrophy, type 1 ([place]) 09/14/2014    Past Surgical History:  Procedure Laterality Date  . BIOPSY BREAST Right   . EYE SURGERY Left 2017    OB History    Gravida Para Term Preterm AB Living   2       2 0   SAB TAB Ectopic Multiple Live Births     2             Home Medications    Prior to Admission medications   Medication Sig Start Date End Date Taking? Authorizing Provider  DULoxetine (CYMBALTA) 30 MG capsule Take 1  capsule (30 mg total) by mouth daily. 01/09/17  Yes Patel, Donika K, DO  ibuprofen (ADVIL,MOTRIN) 800 MG tablet Take 1 tablet (800 mg total) by mouth 3 (three) times daily. 02/04/17   Milton Ferguson, MD    Family History Family History  Problem Relation Age of Onset  . Cancer Father 70       Deceased  . Heart disease Father   . COPD Mother   . Diabetes Maternal Uncle   . Heart disease Maternal Uncle   . Heart disease Paternal Grandmother   . Diabetes Paternal Grandmother   . Hypertension Paternal Grandmother   . Neuromuscular disorder Maternal Aunt        Myotonic dystrophy  . Neuromuscular disorder Cousin        Myotonic dystrophy  . Neuromuscular disorder Sister        Myotonic dystrophy    Social History Social History  Substance Use Topics  . Smoking status: Former Smoker    Packs/day: 0.50    Years: 26.00    Types: Cigarettes    Quit date: 09/2016  . Smokeless tobacco: Never Used  . Alcohol use 0.0 oz/week     Comment: Rarely only holidays     Allergies   Penicillins  Review of Systems Review of Systems  Constitutional: Negative for appetite change and fatigue.  HENT: Negative for congestion, ear discharge and sinus pressure.   Eyes: Negative for discharge.  Respiratory: Negative for cough.   Cardiovascular: Positive for chest pain.  Gastrointestinal: Negative for abdominal pain and diarrhea.  Genitourinary: Negative for frequency and hematuria.  Musculoskeletal: Negative for back pain.  Skin: Negative for rash.  Neurological: Negative for seizures and headaches.  Psychiatric/Behavioral: Negative for hallucinations.     Physical Exam Updated Vital Signs BP 124/79   Pulse 64   Temp 97.6 F (36.4 C) (Oral)   Resp 11   Ht 5\' 3"  (1.6 m)   Wt 46.7 kg (103 lb)   SpO2 100%   BMI 18.25 kg/m   Physical Exam  Constitutional: She is oriented to person, place, and time. She appears well-developed.  HENT:  Head: Normocephalic.  Eyes: Conjunctivae and  EOM are normal. No scleral icterus.  Neck: Neck supple. No thyromegaly present.  Cardiovascular: Normal rate and regular rhythm.  Exam reveals no gallop and no friction rub.   No murmur heard. Pulmonary/Chest: No stridor. She has no wheezes. She has no rales. She exhibits tenderness.  Patient tender right anterior chest  Abdominal: She exhibits no distension. There is no tenderness. There is no rebound.  Musculoskeletal: Normal range of motion. She exhibits no edema.  Lymphadenopathy:    She has no cervical adenopathy.  Neurological: She is oriented to person, place, and time. She exhibits normal muscle tone. Coordination normal.  Skin: No rash noted. No erythema.  Psychiatric: She has a normal mood and affect. Her behavior is normal.     ED Treatments / Results  Labs (all labs ordered are listed, but only abnormal results are displayed) Labs Reviewed  BASIC METABOLIC PANEL - Abnormal; Notable for the following:       Result Value   Glucose, Bld 109 (*)    All other components within normal limits  CBC  D-DIMER, QUANTITATIVE (NOT AT [facility])  Randolm Idol, ED      Radiology Dg Chest 2 View  Result Date: 02/04/2017 CLINICAL DATA:  Chest pain and left arm numbness. EXAM: CHEST  2 VIEW COMPARISON:  None. FINDINGS: The lungs are clear. Heart size is normal. No pneumothorax or pleural effusion. No bony abnormality. IMPRESSION: Negative chest. Electronically Signed   By: Inge Rise M.D.   On: 02/04/2017 12:34    Procedures Procedures (including critical care time)  Medications Ordered in ED Medications  LORazepam (ATIVAN) injection 1 mg (1 mg Intravenous Given 02/04/17 1336)  HYDROmorphone (DILAUDID) injection 1 mg (1 mg Intravenous Given 02/04/17 1333)  ondansetron (ZOFRAN) injection 4 mg (4 mg Intravenous Given 02/04/17 1330)     Initial Impression / Assessment and Plan / ED Course  I have reviewed the triage vital signs and the nursing notes.  Pertinent labs & imaging  results that were available during my care of the patient were reviewed by me and considered in my medical decision making (see chart for details).     Labs including troponin and d-dimer negative. EKG shows no acute changes chest x-ray unremarkable. Patient improved with pain medicine. I suspect chest discomfort is chest wall pain. She will be given some pain medicine will follow-up with her PCP  Final Clinical Impressions(s) / ED Diagnoses   Final diagnoses:  Atypical chest pain    New Prescriptions New Prescriptions   IBUPROFEN (ADVIL,MOTRIN) 800 MG TABLET    Take 1 tablet (  800 mg total) by mouth 3 (three) times daily.     Milton Ferguson, MD 02/04/17 1610

## 2017-02-04 NOTE — ED Notes (Signed)
Doctor at bedside.

## 2017-02-04 NOTE — Discharge Instructions (Signed)
Follow up with your md next week for recheck °

## 2017-02-04 NOTE — ED Triage Notes (Signed)
Per Pt, Pt is coming from church when she started to have chest tightness and pain radiating down her left arm. Pt reports cardiac hx. Pt is taking shallow breaths and encouraged to slow breathing. Diaphoretic.

## 2017-02-07 NOTE — Telephone Encounter (Signed)
Order has been placed.

## 2017-02-07 NOTE — Telephone Encounter (Signed)
Max ipap - 12 Min epap - 5 Back up Resp rate 12 Fio2 - 35%  Dr. Brand Males, M.D., [facility].C.P Pulmonary and Critical Care Medicine Staff Physician [facility] Pager: [phone], If no answer or between  15:00h - 7:00h: call [phone]  02/07/2017 2:09 PM

## 2017-02-12 ENCOUNTER — Ambulatory Visit (HOSPITAL_COMMUNITY)
Admission: RE | Admit: 2017-02-12 | Discharge: 2017-02-12 | Disposition: A | Discharge status: Home / Self Care | Payer: Medicare Other | Source: Ambulatory Visit | Attending: Internal Medicine | Admitting: Internal Medicine

## 2017-02-12 ENCOUNTER — Other Ambulatory Visit: Payer: Self-pay | Admitting: Physician Assistant

## 2017-02-12 DIAGNOSIS — I452 Bifascicular block: Secondary | ICD-10-CM | POA: Diagnosis present

## 2017-02-12 DIAGNOSIS — R55 Syncope and collapse: Secondary | ICD-10-CM | POA: Insufficient documentation

## 2017-02-12 DIAGNOSIS — Z880 Allergy status to penicillin: Secondary | ICD-10-CM | POA: Insufficient documentation

## 2017-02-12 DIAGNOSIS — I441 Atrioventricular block, second degree: Secondary | ICD-10-CM | POA: Diagnosis not present

## 2017-02-12 DIAGNOSIS — R0600 Dyspnea, unspecified: Secondary | ICD-10-CM | POA: Diagnosis not present

## 2017-02-12 DIAGNOSIS — E785 Hyperlipidemia, unspecified: Secondary | ICD-10-CM | POA: Insufficient documentation

## 2017-02-12 DIAGNOSIS — Z539 Procedure and treatment not carried out, unspecified reason: Secondary | ICD-10-CM | POA: Insufficient documentation

## 2017-02-12 DIAGNOSIS — G7111 Myotonic muscular dystrophy: Secondary | ICD-10-CM | POA: Diagnosis not present

## 2017-02-12 DIAGNOSIS — F1290 Cannabis use, unspecified, uncomplicated: Secondary | ICD-10-CM | POA: Insufficient documentation

## 2017-02-12 DIAGNOSIS — R1310 Dysphagia, unspecified: Secondary | ICD-10-CM | POA: Insufficient documentation

## 2017-02-12 DIAGNOSIS — E1069 Type 1 diabetes mellitus with other specified complication: Secondary | ICD-10-CM | POA: Insufficient documentation

## 2017-02-12 DIAGNOSIS — Z87891 Personal history of nicotine dependence: Secondary | ICD-10-CM | POA: Insufficient documentation

## 2017-02-12 DIAGNOSIS — Z3490 Encounter for supervision of normal pregnancy, unspecified, unspecified trimester: Secondary | ICD-10-CM

## 2017-02-12 DIAGNOSIS — Z01812 Encounter for preprocedural laboratory examination: Secondary | ICD-10-CM

## 2017-02-12 DIAGNOSIS — Z01818 Encounter for other preprocedural examination: Secondary | ICD-10-CM

## 2017-02-12 DIAGNOSIS — F329 Major depressive disorder, single episode, unspecified: Secondary | ICD-10-CM | POA: Diagnosis not present

## 2017-02-12 DIAGNOSIS — I4510 Unspecified right bundle-branch block: Secondary | ICD-10-CM | POA: Insufficient documentation

## 2017-02-12 LAB — HCG, QUANTITATIVE, PREGNANCY: HCG, BETA CHAIN, QUANT, S: 7 m[IU]/mL — AB (ref <5)

## 2017-02-12 LAB — SURGICAL PCR SCREEN
MRSA, PCR: NEGATIVE
STAPHYLOCOCCUS AUREUS: NEGATIVE

## 2017-02-12 LAB — PREGNANCY, URINE: Preg Test, Ur: POSITIVE — AB

## 2017-02-12 MED ORDER — VANCOMYCIN HCL IN DEXTROSE 1-5 GM/200ML-% IV SOLN
1000.0000 mg | INTRAVENOUS | Status: DC
  Filled 2017-02-12: qty 200

## 2017-02-12 MED ORDER — CHLORHEXIDINE GLUCONATE 4 % EX LIQD
60.0000 mL | Freq: Once | CUTANEOUS | Status: DC
  Filled 2017-02-12: qty 60

## 2017-02-12 MED ORDER — SODIUM CHLORIDE 0.9 % IV SOLN
INTRAVENOUS | Status: DC
  Administered 2017-02-12: 09:00:00 via INTRAVENOUS

## 2017-02-12 MED ORDER — MUPIROCIN 2 % EX OINT
TOPICAL_OINTMENT | CUTANEOUS | Status: AC
  Administered 2017-02-12: 1 via TOPICAL
  Filled 2017-02-12: qty 22

## 2017-02-12 MED ORDER — MUPIROCIN 2 % EX OINT
1.0000 "application " | TOPICAL_OINTMENT | Freq: Once | CUTANEOUS | Status: AC
  Administered 2017-02-12: 1 via TOPICAL

## 2017-02-12 MED ORDER — SODIUM CHLORIDE 0.9 % IR SOLN: 80.0000 mg | Status: DC

## 2017-02-12 NOTE — H&P (View-Only) (Signed)
Medical     ELECTROPHYSIOLOGY CONSULT NOTE  Patient ID: Cynthia Underwood, MRN: [id number], DOB/AGE: 11/01/1970 46 y.o. Admit date: (Not on file) Date of Consult: 01/30/2017  Primary Physician: [name], MD Primary Cardiologist: TT   Cynthia Underwood is being seen today for the evaluation of myotonic dystrophy and heart block  at the request of  Turner, Eber Hong, MD.   HPI Cynthia Underwood is a 46 y.o. 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  She has had lifelong symptoms, but the diagnosis was only made about 10 years ago.   she's had an isolated episode of syncope with a long prodrome. She has had more recent brief episodes of lightheadedness.    she was referred to pulmonary because of shortness of breath. It is felt to be related to neuromuscular weakness.  Cardiac evaluation has been extensive. MRI scanning >>> late gadolinium enhancement in the inferior and inferolateral walls.   FDG-PET >>> no uptake and hence not consistent with sarcoid  No evidence of LV dilitation  FamHx involves paternal lineage with aunts    Past Medical History:  Diagnosis Date  . Anemia   . Bifascicular block 10/02/2014  . Depression   . Excess ear wax   . Headache   . 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 Watery eyes    Left  . Weakness of both legs       Surgical History:  Past Surgical History:  Procedure Laterality Date  . BIOPSY BREAST Right   . EYE SURGERY Left 2017     Home Meds: Prior to Admission medications   Medication Sig Start Date End Date Taking? Authorizing Provider  DULoxetine (CYMBALTA) 30 MG capsule Take 1 capsule (30 mg total) by mouth daily. 01/09/17  Yes Narda Amber K, DO    Allergies:  Allergies  Allergen Reactions  . Penicillins Nausea And Vomiting, Rash and Other (See Comments)    fever    Social History    Social History  . Marital status: Single    Spouse name: N/A  . Number of children: 0  . Years of education: 1.5 Collge   Occupational History  . Unemployed Other   Social History Main Topics  . Smoking status: Former Smoker    Packs/day: 0.50    Years: 26.00    Types: Cigarettes    Quit date: 09/2016  . Smokeless tobacco: Never Used  . Alcohol use 0.0 oz/week     Comment: Rarely only holidays  . Drug use: Yes    Frequency: 2.0 times per week    Types: Marijuana     Comment: month ago  . Sexual activity: Yes    Birth control/ protection: None, Condom   Other Topics Concern  . Not on file   Social History Narrative   Patient lives at home with her aunt.   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   She has been on disability since 2010.   Education: 1 1/2 years of college.        Family History  Problem Relation Age of Onset  . Cancer Father 34       Deceased  . Heart disease Father   . COPD Mother   . Diabetes Maternal Uncle   . Heart disease Maternal  Uncle   . Heart disease Paternal Grandmother   . Diabetes Paternal Grandmother   . Hypertension Paternal Grandmother   . Neuromuscular disorder Maternal Aunt        Myotonic dystrophy  . Neuromuscular disorder Cousin        Myotonic dystrophy  . Neuromuscular disorder Sister        Myotonic dystrophy     ROS:  Please see the history of present illness.     All other systems reviewed and negative.    Physical Exam:  Blood pressure (!) 100/54, pulse (!) 54, height 5\' 3"  (1.6 m), weight 103 lb (46.7 kg), SpO2 98 %. General: Well developed, well nourished tiny  female in no acute distress. Head: Normocephalic, atraumatic, sclera non-icteric, no xanthomas, nares are without discharge. EENT: normal  Lymph Nodes:  none Neck: Negative for carotid bruits. JVD not elevated. Back:without scoliosis kyphosis  Lungs: Clear bilaterally to auscultation without  wheezes, rales, or rhonchi. Breathing is unlabored. Heart: RRR with S1 S2. No   murmur . No rubs, or gallops appreciated. Abdomen: Soft, non-tender, non-distended with normoactive bowel sounds. No hepatomegaly. No rebound/guarding. No obvious abdominal masses. Msk:  Strength and tone appear normal for age. Extremities: No clubbing or cyanosis. No edema.  Distal pedal pulses are 2+ and equal bilaterally. Skin: Warm and Dry Neuro: Alert and oriented X 3. CN III-XII intact Grossly normal sensory and motor function . Flat facies  Psych:  Responds to questions appropriately with a normal affect.      Labs: Cardiac Enzymes No results for input(s): CKTOTAL, CKMB, TROPONINI in the last 72 hours. CBC Lab Results  Component Value Date   WBC 5.0 05/16/2014   HGB 12.8 05/16/2014   HCT 39.3 05/16/2014   MCV 95.4 05/16/2014   PLT 307 05/16/2014   PROTIME: No results for input(s): LABPROT, INR in the last 72 hours. Chemistry No results for input(s): NA, K, CL, CO2, BUN, CREATININE, CALCIUM, PROT, BILITOT, ALKPHOS, ALT, AST, GLUCOSE in the last 168 hours.  Invalid input(s): LABALBU Lipids Lab Results  Component Value Date   CHOL 184 01/05/2017   HDL 67 01/05/2017   LDLCALC 104 (H) 01/05/2017   TRIG 65 01/05/2017   BNP No results found for: PROBNP Thyroid Function Tests: No results for input(s): TSH, T4TOTAL, T3FREE, THYROIDAB in the last 72 hours.  Invalid input(s): FREET3 Miscellaneous No results found for: DDIMER  Radiology/Studies:  Mm Digital Screening Bilateral  Result Date: 01/19/2017 CLINICAL DATA:  Screening. EXAM: DIGITAL SCREENING BILATERAL MAMMOGRAM WITH CAD COMPARISON:  Previous exam(s). ACR Breast Density Category d: The breast tissue is extremely dense, which lowers the sensitivity of mammography. FINDINGS: There are no findings suspicious for malignancy. Images were processed with CAD. IMPRESSION: No mammographic evidence of malignancy. A result letter of this screening  mammogram will be mailed directly to the patient. RECOMMENDATION: Screening mammogram in one year. (Code:SM-B-01Y) BI-RADS CATEGORY  1: Negative. Electronically Signed   By: Everlean Alstrom M.D.   On: 01/19/2017 15:59    EKG: Sinus rhythm at 54 Intervals 15/14/48 Axis left -54  Event recorder Personally reviewed  2-1 heart block developed during daylight hours.  Assessment and Plan:  Syncope  Myotonic dystrophy  First-degree AV block-intermittent  2:1 second-degree heart block  Dyspnea    The patient has myotonic dystrophy with progressive conduction system disease with a 21 heart block and intermittent first-degree block. This is a class I indication for pacing. Her syncopal event sounds as if it were  neurally mediated but it could've been bradycardia mediated as well. This further augments the importance of pacing. I do not think that there is an indication for EP testing to look at [facility] intervals  The benefits and risks were reviewed including but not limited to death,  perforation, infection, lead dislodgement and device malfunction.  The patient understands agrees and is willing to proceed.      Virl Axe

## 2017-02-12 NOTE — Progress Notes (Signed)
Trying to get all infor on same note Her syncope has been infrequent and her abnormalities fo conduction present a long time; hence, there is not an emergent need to proceed with pacing.  I am not sure how to adjudicate the pregnancy tests in this 46 yo woman who has had irregular menses and hot flashes for years Hopefully serum test will clarify

## 2017-02-12 NOTE — Interval H&P Note (Deleted)
History and Physical Interval Note:  02/12/2017 10:17 AM  Cynthia Underwood  has presented today for surgery, with the diagnosis of myotonic distrophy/ heart block  The various methods of treatment have been discussed with the patient and family. After consideration of risks, benefits and other options for treatment, the patient has consented to  Procedure(s): Pacemaker Implant (N/A) as a surgical intervention .  The patient's history has been reviewed, patient examined, no change in status, stable for surgery.  I have reviewed the patient's chart and labs.  Questions were answered to the patient's satisfaction.     Virl Axe  Specificity of urine pregnancy testing is hard to clarify but is reported [place] of 90% and is most likely false + with peri or post menopausal women

## 2017-02-12 NOTE — H&P (View-Only) (Signed)
Subjective:     Patient ID: Cynthia Underwood, female   DOB: [birth year], 46 y.o.   MRN: [id number]  HPI HPI   OV 12/22/2016  Chief Complaint  Patient presents with  . pulmonary consult    refered by Dr Radford Pax for SOB. no chest pain. pt c/o coughing non product   46 year old African-American female. 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 She's had this since age 46. Her current features include lockjaw and stiffness of the hands and fingersassociated with fatigue, weakness, daytime sleepiness or shortness of breath. She walks independently but has been told to use braces. She has lower extremity weakness as well particularly proximal muscles.Over the last few years she's also had dysphagia for which she uses a chin tuck approach. She sees Dr. Posey Pronto in the neurology clinic  She has now been referred to the pulmonary clinic because of her shortness of breath. She tells me that she's been dyspneic for over 20 years. For the last few years it has been progressive. It is moderate to severe in intensity. It is associated with exertion relieved by rest. Activities of daily living can make her dyspneic. Is also orthopnea sometimes. There is some cough associated but no wheezing or sputum production. There is no associated chest pain or diaphoresis or weight loss. She believes she does not have sarcoidosis.  04/03/2011: Pulmonary function test done at [facility] shows uniform restriction with low DLCO 50s%. Personally reviewed the trace and visualized it 05/16/2014: Hemoglobin is 12.8 g percent 10/02/2014: Angiotensin-converting enzyme was normal 06/05/2016: Creatinine is normal 0.6 mg percent 10/26/2016 pregnancy test normal 10/26/2016: Myocardial perfusion PET scan: Negative for uptake for sarcoid 12/11/2016: Echocardiogram is normal  12/22/2016 : walking desat test  with forehead probe on room air: did not desat. Pulse ox stayed > 95%. HR 70s to 84/min Stopped a fe times due to dyspnea and fatigue and neuromuscular imbalance  12/22/2016 - feno for asthma: could not do due to difficulty with maneuvers   OV 01/17/2017  Chief Complaint  Patient presents with  . Follow-up    follow up after CT scan. States breathing has been ok since last visit.     FU unexplained dyspnea with neuromuscular disease   She has unexplained dyspnea. She is here to follow-up for her CT scan results. The CT scan results high-resolution CT chest is normal. Today she was able to complete the exhaled nitric oxide testing and this was normal too. She has no new complaints. She thinks she'll be able to do a pulmonary stress test on the bike   ABG Results for DESHANNON, SEIDE (MRN [id number]) as of 01/17/2017 18:25  Ref. Range 01/01/2017 13:49  FIO2 Unknown 21.00  pH, Arterial Latest Ref Range: 7.350 - 7.450  7.364  pCO2 arterial Latest Ref Range: 32.0 - 48.0 mmHg 51.7 (H)  pO2, Arterial Latest Ref Range: 83.0 - 108.0 mmHg 69.4 (L)    IMPRESSION:  CT CHEST 1. No evidence of of interstitial lung disease. 2. No acute findings in the thorax to account for the patient's symptoms.   Electronically Signed   By: Vinnie Langton M.D.   On: 12/28/2016 11:02   FeNO 15ppb and normal 01/17/2017    has a past medical history of Anemia; Bifascicular block (10/02/2014); Depression; Excess ear wax; Headache; Myotonic dystrophy, type 1 (HCC); RBBB (right 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) (  10/02/2014); Watery eyes; and Weakness of both legs.   reports that she quit smoking about 4 months ago. Her smoking use included Cigarettes. She has a 13.00 pack-year smoking history. She has never used smokeless tobacco.  Past Surgical History:  Procedure Laterality Date  . BIOPSY BREAST Right   . EYE SURGERY Left 2017    Allergies  Allergen Reactions  . Penicillins  Nausea And Vomiting, Rash and Other (See Comments)    fever    Immunization History  Administered Date(s) Administered  . Influenza,inj,Quad PF,36+ Mos 08/11/2014, 07/01/2015    Family History  Problem Relation Age of Onset  . Cancer Father 24       Deceased  . Heart disease Father   . COPD Mother   . Diabetes Maternal Uncle   . Heart disease Maternal Uncle   . Heart disease Paternal Grandmother   . Diabetes Paternal Grandmother   . Hypertension Paternal Grandmother   . Neuromuscular disorder Maternal Aunt        Myotonic dystrophy  . Neuromuscular disorder Cousin        Myotonic dystrophy  . Neuromuscular disorder Sister        Myotonic dystrophy     Current Outpatient Prescriptions:  .  DULoxetine (CYMBALTA) 30 MG capsule, Take 1 capsule (30 mg total) by mouth daily., Disp: 90 capsule, Rfl: 3   Review of Systems     Objective:   Physical Exam Vitals:   01/17/17 1008  BP: 114/68  Pulse: 97  SpO2: 91%  Weight: 102 lb 3.2 oz (46.4 kg)  Height: 5\' 3"  (1.6 m)   Body mass index is 18.1 kg/m.  Discussion visit    Assessment:       ICD-9-CM ICD-10-CM   1. Dyspnea and respiratory abnormality 786.09 R06.00 Nitric oxide    R06.89   2. Dyspnea and respiratory abnormality 786.09 R06.00 Nitric oxide    R06.89        Plan:     Dyspnea and respiratory abnormality No evidence of asthma No evidence of lung tissue disease Shortness of breath likely due to neuromuscular issues basd on ABG but not certain.   Plan Pulmonary [facility] with Serita Butcher Will also discuss with Dr Posey Pronto Neuromuscular specialist  Will ask RT to provide NIF result  Followup 2 weeks after stress test with myself or APP Tammy Parrett but might cancel dependong on Dr Serita Grit opinin    (> 50% of this 15 min visit spent in face to face counseling or/and coordination of care)   Dr. Brand Males, M.D., [facility].C.P Pulmonary and Critical Care Medicine Staff  Physician [facility] Pager: [phone], If no answer or between  15:00h - 7:00h: call [phone]  01/17/2017 10:52 AM

## 2017-02-12 NOTE — Interval H&P Note (Signed)
History and Physical Interval Note:  02/12/2017 10:07 AM  Cynthia Underwood  has presented today for surgery, with the diagnosis of myotonic distrophy/ heart block  The various methods of treatment have been discussed with the patient and family. After consideration of risks, benefits and other options for treatment, the patient has consented to  Procedure(s): Pacemaker Implant (N/A) as a surgical intervention .  The patient's history has been reviewed, patient examined, no change in status, stable for surgery.  I have reviewed the patient's chart and labs.  Questions were answered to the patient's satisfaction.     Virl Axe  Reviewed that urine pregnancy test positive and await results from serum test No prior children

## 2017-02-12 NOTE — Interval H&P Note (Signed)
History and Physical Interval Note:  02/12/2017 10:07 AM  [name]  has presented today for surgery, with the diagnosis of myotonic distrophy/ heart block  The various methods of treatment have been discussed with the patient and family. After consideration of risks, benefits and other options for treatment, the patient has consented to  Procedure(s): Pacemaker Implant (N/A) as a surgical intervention .  The patient's history has been reviewed, patient examined, no change in status, stable for surgery.  I have reviewed the patient's chart and labs.  Questions were answered to the patient's satisfaction.     Cynthia Underwood

## 2017-02-12 NOTE — Discharge Instructions (Signed)
Dc home to return 02/15/17 0700, pt aware

## 2017-02-14 ENCOUNTER — Telehealth: Payer: Self-pay | Admitting: Student

## 2017-02-14 ENCOUNTER — Other Ambulatory Visit: Payer: Medicare Other | Admitting: *Deleted

## 2017-02-14 ENCOUNTER — Other Ambulatory Visit: Payer: Self-pay | Admitting: *Deleted

## 2017-02-14 DIAGNOSIS — Z01812 Encounter for preprocedural laboratory examination: Secondary | ICD-10-CM | POA: Diagnosis not present

## 2017-02-14 DIAGNOSIS — G7111 Myotonic muscular dystrophy: Secondary | ICD-10-CM | POA: Diagnosis not present

## 2017-02-14 DIAGNOSIS — Z3A01 Less than 8 weeks gestation of pregnancy: Secondary | ICD-10-CM | POA: Diagnosis not present

## 2017-02-14 DIAGNOSIS — R002 Palpitations: Secondary | ICD-10-CM

## 2017-02-14 DIAGNOSIS — R0600 Dyspnea, unspecified: Secondary | ICD-10-CM

## 2017-02-14 NOTE — Telephone Encounter (Signed)
[facility] called to report the patient's STAT repeat Beta hCG was 7, which is identical to her prior lab draw on 02/12/2017.  Signed, Erma Heritage, PA-C 02/14/2017, 6:08 PM

## 2017-02-14 NOTE — Progress Notes (Signed)
0044 

## 2017-02-15 ENCOUNTER — Encounter (HOSPITAL_COMMUNITY): Payer: Self-pay | Admitting: Internal Medicine

## 2017-02-15 ENCOUNTER — Ambulatory Visit (HOSPITAL_COMMUNITY)
Admission: RE | Admit: 2017-02-15 | Discharge: 2017-02-16 | Disposition: A | Discharge status: Home / Self Care | Payer: Medicare Other | Source: Ambulatory Visit | Attending: Internal Medicine | Admitting: Internal Medicine

## 2017-02-15 ENCOUNTER — Encounter (HOSPITAL_COMMUNITY)
Admission: RE | Disposition: A | Discharge status: Home / Self Care | Payer: Self-pay | Source: Ambulatory Visit | Attending: Internal Medicine

## 2017-02-15 DIAGNOSIS — I441 Atrioventricular block, second degree: Secondary | ICD-10-CM | POA: Diagnosis present

## 2017-02-15 DIAGNOSIS — Z880 Allergy status to penicillin: Secondary | ICD-10-CM | POA: Diagnosis not present

## 2017-02-15 DIAGNOSIS — I442 Atrioventricular block, complete: Secondary | ICD-10-CM | POA: Diagnosis not present

## 2017-02-15 DIAGNOSIS — I452 Bifascicular block: Secondary | ICD-10-CM | POA: Diagnosis present

## 2017-02-15 DIAGNOSIS — G7111 Myotonic muscular dystrophy: Secondary | ICD-10-CM | POA: Diagnosis not present

## 2017-02-15 DIAGNOSIS — Z959 Presence of cardiac and vascular implant and graft, unspecified: Secondary | ICD-10-CM

## 2017-02-15 HISTORY — PX: PACEMAKER IMPLANT: EP1218

## 2017-02-15 HISTORY — PX: INSERT / REPLACE / REMOVE PACEMAKER: SUR710

## 2017-02-15 HISTORY — DX: Sickle-cell trait: D57.3

## 2017-02-15 HISTORY — DX: Presence of cardiac pacemaker: Z95.0

## 2017-02-15 LAB — BETA HCG QUANT (REF LAB): HCG QUANT: 7 m[IU]/mL

## 2017-02-15 SURGERY — PACEMAKER IMPLANT

## 2017-02-15 MED ORDER — ACETAMINOPHEN 325 MG PO TABS
650.0000 mg | ORAL_TABLET | ORAL | Status: DC | PRN
  Filled 2017-02-15: qty 2

## 2017-02-15 MED ORDER — HEPARIN (PORCINE) IN NACL 2-0.9 UNIT/ML-% IJ SOLN
INTRAMUSCULAR | Status: AC
  Filled 2017-02-15: qty 1000

## 2017-02-15 MED ORDER — VANCOMYCIN HCL IN DEXTROSE 1-5 GM/200ML-% IV SOLN
1000.0000 mg | Freq: Two times a day (BID) | INTRAVENOUS | Status: AC
Start: 2017-02-15 — End: 2017-02-15
  Administered 2017-02-15: 20:00:00 1000 mg via INTRAVENOUS
  Filled 2017-02-15: qty 200

## 2017-02-15 MED ORDER — FENTANYL CITRATE (PF) 100 MCG/2ML IJ SOLN
INTRAMUSCULAR | Status: AC
  Filled 2017-02-15: qty 2

## 2017-02-15 MED ORDER — ONDANSETRON HCL 4 MG/2ML IJ SOLN: 4.0000 mg | Freq: Four times a day (QID) | INTRAMUSCULAR | Status: DC | PRN

## 2017-02-15 MED ORDER — SODIUM CHLORIDE 0.9 % IR SOLN
Status: AC
  Filled 2017-02-15: qty 2

## 2017-02-15 MED ORDER — LIDOCAINE HCL (PF) 1 % IJ SOLN
INTRAMUSCULAR | Status: AC
  Filled 2017-02-15: qty 30

## 2017-02-15 MED ORDER — ACETAMINOPHEN 325 MG PO TABS
325.0000 mg | ORAL_TABLET | ORAL | Status: DC | PRN
  Administered 2017-02-15: 13:00:00 650 mg via ORAL
  Filled 2017-02-15: qty 2

## 2017-02-15 MED ORDER — LIDOCAINE HCL (PF) 1 % IJ SOLN
INTRAMUSCULAR | Status: DC | PRN
  Administered 2017-02-15: 30 mL

## 2017-02-15 MED ORDER — CHLORHEXIDINE GLUCONATE 4 % EX LIQD
60.0000 mL | Freq: Once | CUTANEOUS | Status: DC
  Filled 2017-02-15: qty 60

## 2017-02-15 MED ORDER — HEPARIN (PORCINE) IN NACL 2-0.9 UNIT/ML-% IJ SOLN
INTRAMUSCULAR | Status: AC | PRN
  Administered 2017-02-15: 500 mL

## 2017-02-15 MED ORDER — MIDAZOLAM HCL 5 MG/5ML IJ SOLN
INTRAMUSCULAR | Status: DC | PRN
  Administered 2017-02-15: 0.5 mg via INTRAVENOUS
  Administered 2017-02-15: 1 mg via INTRAVENOUS

## 2017-02-15 MED ORDER — VANCOMYCIN HCL IN DEXTROSE 1-5 GM/200ML-% IV SOLN
1000.0000 mg | INTRAVENOUS | Status: AC
  Administered 2017-02-15: 1000 mg via INTRAVENOUS

## 2017-02-15 MED ORDER — YOU HAVE A PACEMAKER BOOK
Freq: Once | Status: AC
  Administered 2017-02-15: 21:00:00
  Filled 2017-02-15: qty 1

## 2017-02-15 MED ORDER — SODIUM CHLORIDE 0.9 % IR SOLN
80.0000 mg | Status: AC
  Administered 2017-02-15: 80 mg

## 2017-02-15 MED ORDER — MIDAZOLAM HCL 5 MG/5ML IJ SOLN
INTRAMUSCULAR | Status: AC
  Filled 2017-02-15: qty 5

## 2017-02-15 MED ORDER — DULOXETINE HCL 30 MG PO CPEP
30.0000 mg | ORAL_CAPSULE | Freq: Every day | ORAL | Status: DC
  Administered 2017-02-16: 30 mg via ORAL
  Filled 2017-02-15 (×2): qty 1

## 2017-02-15 MED ORDER — SODIUM CHLORIDE 0.9 % IV SOLN
INTRAVENOUS | Status: DC
  Administered 2017-02-15: 08:00:00 via INTRAVENOUS

## 2017-02-15 MED ORDER — FENTANYL CITRATE (PF) 100 MCG/2ML IJ SOLN
INTRAMUSCULAR | Status: DC | PRN
  Administered 2017-02-15: 25 ug via INTRAVENOUS

## 2017-02-15 MED ORDER — VANCOMYCIN HCL IN DEXTROSE 1-5 GM/200ML-% IV SOLN
INTRAVENOUS | Status: AC
  Filled 2017-02-15: qty 200

## 2017-02-15 MED ORDER — SODIUM CHLORIDE 0.9 % IV SOLN: INTRAVENOUS | Status: AC

## 2017-02-15 MED ORDER — ENSURE ENLIVE PO LIQD
237.0000 mL | Freq: Two times a day (BID) | ORAL | Status: DC
  Administered 2017-02-15 – 2017-02-16 (×2): 237 mL via ORAL
  Filled 2017-02-15: qty 237

## 2017-02-15 SURGICAL SUPPLY — 13 items
CABLE SURGICAL S-101-97-12 (CABLE) ×3 IMPLANT
CATH RIGHTSITE C315HIS02 (CATHETERS) ×3 IMPLANT
HEMOSTAT SURGICEL 2X4 FIBR (HEMOSTASIS) ×3 IMPLANT
IPG PACE AZUR XT DR MRI W1DR01 (Pacemaker) ×1 IMPLANT
LEAD CAPSURE NOVUS 45CM (Lead) ×3 IMPLANT
LEAD SELECT SECURE 3830 383069 (Lead) ×1 IMPLANT
PACE AZURE XT DR MRI W1DR01 (Pacemaker) ×3 IMPLANT
PAD DEFIB LIFELINK (PAD) ×3 IMPLANT
SELECT SECURE 3830 383069 (Lead) ×3 IMPLANT
SHEATH CLASSIC 7F (SHEATH) ×6 IMPLANT
SLITTER UNIVERSAL DS2A003 (MISCELLANEOUS) ×3 IMPLANT
TRAY PACEMAKER INSERTION (PACKS) ×3 IMPLANT
WIRE HI TORQ VERSACORE-J 145CM (WIRE) ×3 IMPLANT

## 2017-02-15 NOTE — Interval H&P Note (Signed)
History and Physical Interval Note:  02/15/2017 7:50 AM  [name]  has presented today for surgery, with the diagnosis of myotonic distrophy/ heart block  The various methods of treatment have been discussed with the patient and family. After consideration of risks, benefits and other options for treatment, the patient has consented to  Procedure(s): Pacemaker Implant (N/A) as a surgical intervention .  The patient's history has been reviewed, patient examined, no change in status, stable for surgery.  I have reviewed the patient's chart and labs.  Questions were answered to the patient's satisfaction.     Cynthia Underwood  Pt was seen on Monday and procedure canceled 2/2 pos preg test with abnormal quant HCG   Repeat quant yday is unchanged and this per lab NOT Consistent with prenancy

## 2017-02-15 NOTE — Discharge Summary (Signed)
ELECTROPHYSIOLOGY PROCEDURE DISCHARGE SUMMARY    Patient ID: Cynthia Underwood,  MRN: [id number], DOB/AGE: [birth year] 48 y.o.  Admit date: 02/15/2017 Discharge date: 6/15.18  Primary Care Physician: [name], MD  Primary Cardiologist: Dr. Radford Pax Electrophysiologist: Dr. Caryl Comes  Primary Discharge Diagnosis:  1. High degree AVblock  Secondary Discharge Diagnosis:  1. Myotonic Dystrophy    Allergies  Allergen Reactions  . Penicillins Nausea And Vomiting, Rash and Other (See Comments)    Fever Has patient had a PCN reaction causing immediate rash, facial/tongue/throat swelling, SOB or lightheadedness with hypotension: Yes Has patient had a PCN reaction causing severe rash involving mucus membranes or skin necrosis: Unknown Has patient had a PCN reaction that required hospitalization: No Has patient had a PCN reaction occurring within the last 10 years: No If all of the above answers are "NO", then may proceed with Cephalosporin use.      Procedures This Admission:  1.  Implantation of a MDT dual chamber PPM on 02/15/17 by Dr Caryl Comes.  The patient received a Medtronic MRI compatible pulse generator serial number [id number], Medtronic MRI compatible HIS 3830 ventricular lead serial [id number] and a Medtronic MRI compatible 5076 atrial lead serial number [id number] . There were no immediate post procedure complications. 2.  CXR on 02/16/17 demonstrated no pneumothorax status post device implantation.   Brief HPI: Cynthia Underwood is a 46 y.o. female was referred to electrophysiology in the outpatient setting for consideration of PPM implantation.  Past medical history includes syncope with 2:1 AV block without reversible causes identified.  Risks, benefits, and alternatives to PPM implantation were reviewed with the patient who wished to proceed.   Hospital Course:  The patient was admitted and underwent implantation of a PPM with details as outlined above.  She was  monitored on telemetry overnight which demonstrated SR.  Left chest was without hematoma or ecchymosis.  The device was interrogated and found to be functioning normally.  CXR was obtained and demonstrated no pneumothorax status post device implantation.  Wound care, arm mobility, and restrictions were reviewed with the patient.  The patient was examined by Dr. Lovena Le and considered stable for discharge to home.    Physical Exam: Vitals:   02/15/17 1908 02/15/17 2000 02/16/17 0529 02/16/17 0745  BP: 127/73  121/81 108/60  Pulse: 63  85 81  Resp: 17 19 13  (!) 26  Temp: 97 F (36.1 C)  97.4 F (36.3 C) 98 F (36.7 C)  TempSrc: Oral  Oral Oral  SpO2: 100%  99% 99%  Weight:   108 lb 3.9 oz (49.1 kg)   Height:        GEN- The patient is well appearing, thin/petite body habitusalert and oriented x 3 today.   HEENT: normocephalic, atraumatic; sclera clear, conjunctiva pink; hearing intact; oropharynx clear; neck supple, no JVP Lungs- CTA b/l, normal work of breathing.  No wheezes, rales, rhonchi Heart- RRR, no murmurs, rubs or gallops, PMI not laterally displaced GI- soft, non-tender, non-distended Extremities- no clubbing, cyanosis, or edema MS- no significant deformity or atrophy Skin- warm and dry, no rash or lesion, left chest without hematoma/ecchymosis Psych- euthymic mood, full affect Neuro- no gross deficits   Labs:   Lab Results  Component Value Date   WBC 7.9 02/04/2017   HGB 12.7 02/04/2017   HCT 40.0 02/04/2017   MCV 94.1 02/04/2017   PLT 278 02/04/2017   No results for input(s): NA, K, CL, CO2, BUN, CREATININE, CALCIUM, PROT, BILITOT, ALKPHOS, ALT,  AST, GLUCOSE in the last 168 hours.  Invalid input(s): LABALBU  Discharge Medications:  Allergies as of 02/16/2017      Reactions   Penicillins Nausea And Vomiting, Rash, Other (See Comments)   Fever Has patient had a PCN reaction causing immediate rash, facial/tongue/throat swelling, SOB or lightheadedness with  hypotension: Yes Has patient had a PCN reaction causing severe rash involving mucus membranes or skin necrosis: Unknown Has patient had a PCN reaction that required hospitalization: No Has patient had a PCN reaction occurring within the last 10 years: No If all of the above answers are "NO", then may proceed with Cephalosporin use.      Medication List    STOP taking these medications   ibuprofen 800 MG tablet Commonly known as:  ADVIL,MOTRIN     TAKE these medications   DULoxetine 30 MG capsule Commonly known as:  CYMBALTA Take 1 capsule (30 mg total) by mouth daily.       Disposition:  Home  Discharge Instructions    Diet - low sodium heart healthy    Complete by:  As directed    Increase activity slowly    Complete by:  As directed      Follow-up Information    [facility] Follow up on 03/01/2017.   Why:  9:15 am for follow up apt Contact information: [place] [id number] [place] Office Follow up on 02/26/2017.   Specialty:  Cardiology Why:  4:00PM, wound check visit Contact information: [address] [place]       Deboraha Sprang, MD Follow up on 05/15/2017.   Specialty:  Cardiology Why:  3:00PM Contact information: [address] [address] [phone]           Duration of Discharge Encounter: Greater than 30 minutes including physician time.  Venetia Night, PA-C 02/16/2017 8:45 AM  EP Attending  Patient seen and examined. Agree with above.   Mikle Bosworth.D.

## 2017-02-15 NOTE — Care Management Note (Addendum)
Case Management Note  Patient Details  Name: Cynthia Underwood MRN: [id number] Date of Birth: 1971/06/20  Subjective/Objective:  From home with her aunt, she uses [facility] for MD aps, she goes to [facility] , s/p pace maker implant.  Has Medicare/Medicaid insurance.    PCP [name]                  Action/Plan: NCM will follow for dc needs.   Expected Discharge Date:                  Expected Discharge Plan:  Home/Self Care  In-House Referral:     Discharge planning Services  CM Consult  Post Acute Care Choice:    Choice offered to:     DME Arranged:    DME Agency:     HH Arranged:    [place] Agency:     Status of Service:  Completed, signed off  If discussed at [employer] of Stay Meetings, dates discussed:    Additional Comments:  Zenon Mayo, RN 02/15/2017, 3:54 PM

## 2017-02-15 NOTE — H&P (View-Only) (Signed)
Trying to get all infor on same note Her syncope has been infrequent and her abnormalities fo conduction present a long time; hence, there is not an emergent need to proceed with pacing.  I am not sure how to adjudicate the pregnancy tests in this 46 yo woman who has had irregular menses and hot flashes for years Hopefully serum test will clarify

## 2017-02-16 ENCOUNTER — Ambulatory Visit (HOSPITAL_COMMUNITY): Payer: Medicare Other

## 2017-02-16 DIAGNOSIS — Z452 Encounter for adjustment and management of vascular access device: Secondary | ICD-10-CM | POA: Diagnosis not present

## 2017-02-16 DIAGNOSIS — I441 Atrioventricular block, second degree: Secondary | ICD-10-CM | POA: Diagnosis not present

## 2017-02-16 DIAGNOSIS — Z880 Allergy status to penicillin: Secondary | ICD-10-CM | POA: Diagnosis not present

## 2017-02-16 DIAGNOSIS — G7111 Myotonic muscular dystrophy: Secondary | ICD-10-CM | POA: Diagnosis not present

## 2017-02-16 NOTE — Discharge Instructions (Signed)
° ° °  Supplemental Discharge Instructions for  Pacemaker/Defibrillator Patients  Activity No heavy lifting or vigorous activity with your left/right arm for 6 to 8 weeks.  Do not raise your left/right arm above your head for one week.  Gradually raise your affected arm as drawn below.             02/19/17                     02/20/17                    02/21/17                  02/22/17 __  NO DRIVING until cleared to at your wound check visit  [place] the wound area clean and dry.  Do not get this area wet, no showers for 24 hours; you may shower on 02/16/17 evening . - The tape/steri-strips on your wound will fall off; do not pull them off.  No bandage is needed on the site.  DO  NOT apply any creams, oils, or ointments to the wound area. - If you notice any drainage or discharge from the wound, any swelling or bruising at the site, or you develop a fever > 101? F after you are discharged home, call the office at once.  Special Instructions - You are still able to use cellular telephones; use the ear opposite the side where you have your pacemaker/defibrillator.  Avoid carrying your cellular phone near your device. - When traveling through airports, show security personnel your identification card to avoid being screened in the metal detectors.  Ask the security personnel to use the hand wand. - Avoid arc welding equipment, MRI testing (magnetic resonance imaging), TENS units (transcutaneous nerve stimulators).  Call the office for questions about other devices. - Avoid electrical appliances that are in poor condition or are not properly grounded. - Microwave ovens are safe to be near or to operate.  Additional information for defibrillator patients should your device go off: - If your device goes off ONCE and you feel fine afterward, notify the device clinic nurses. - If your device goes off ONCE and you do not feel well afterward, call 911. - If your device goes off TWICE, call  911. - If your device goes off THREE times in one day, call 911.  DO NOT DRIVE YOURSELF OR A FAMILY MEMBER WITH A DEFIBRILLATOR TO THE HOSPITAL--CALL 911.

## 2017-02-23 DIAGNOSIS — H524 Presbyopia: Secondary | ICD-10-CM | POA: Diagnosis not present

## 2017-02-23 DIAGNOSIS — G7111 Myotonic muscular dystrophy: Secondary | ICD-10-CM | POA: Diagnosis not present

## 2017-02-23 DIAGNOSIS — H25043 Posterior subcapsular polar age-related cataract, bilateral: Secondary | ICD-10-CM | POA: Diagnosis not present

## 2017-02-26 ENCOUNTER — Ambulatory Visit (INDEPENDENT_AMBULATORY_CARE_PROVIDER_SITE_OTHER): Payer: Medicare Other | Admitting: *Deleted

## 2017-02-26 DIAGNOSIS — I441 Atrioventricular block, second degree: Secondary | ICD-10-CM | POA: Diagnosis not present

## 2017-02-27 LAB — CUP PACEART INCLINIC DEVICE CHECK
Battery Remaining Longevity: 156 mo
Brady Statistic AP VP Percent: 0.68 %
Brady Statistic AS VP Percent: 47.62 %
Brady Statistic RA Percent Paced: 1.74 %
Brady Statistic RV Percent Paced: 48.47 %
Date Time Interrogation Session: 20180625201546
Implantable Lead Implant Date: 20180614
Implantable Lead Location: 753859
Implantable Lead Location: 753859
Implantable Lead Model: 3830
Lead Channel Impedance Value: 551 Ohm
Lead Channel Pacing Threshold Pulse Width: 0.4 ms
Lead Channel Sensing Intrinsic Amplitude: 2.625 mV
Lead Channel Sensing Intrinsic Amplitude: 3.125 mV
Lead Channel Sensing Intrinsic Amplitude: 3.375 mV
Lead Channel Setting Pacing Amplitude: 3.5 V
Lead Channel Setting Pacing Amplitude: 3.5 V
Lead Channel Setting Pacing Pulse Width: 1 ms
Lead Channel Setting Sensing Sensitivity: 2 mV
MDC IDC LEAD IMPLANT DT: 20180614
MDC IDC MSMT BATTERY VOLTAGE: 3.21 V
MDC IDC MSMT LEADCHNL RA IMPEDANCE VALUE: 266 Ohm
MDC IDC MSMT LEADCHNL RA IMPEDANCE VALUE: 342 Ohm
MDC IDC MSMT LEADCHNL RA PACING THRESHOLD AMPLITUDE: 0.75 V
MDC IDC MSMT LEADCHNL RA SENSING INTR AMPL: 2.5 mV
MDC IDC MSMT LEADCHNL RV IMPEDANCE VALUE: 380 Ohm
MDC IDC MSMT LEADCHNL RV PACING THRESHOLD AMPLITUDE: 0.75 V
MDC IDC MSMT LEADCHNL RV PACING THRESHOLD PULSEWIDTH: 0.4 ms
MDC IDC PG IMPLANT DT: 20180614
MDC IDC STAT BRADY AP VS PERCENT: 1.1 %
MDC IDC STAT BRADY AS VS PERCENT: 50.6 %

## 2017-02-27 NOTE — Progress Notes (Signed)
Wound check appointment. Dermabond removed. Wound without redness or edema. Incision edges approximated, wound well healed. Normal device function. Thresholds, sensing, and impedances consistent with implant measurements. HBP lead tested with use of rhythm strip, septal pacing noted <6V to LOC. Device programmed at 3.5V for extra safety margin until 3 month visit. Histogram distribution appropriate for patient and level of activity. (25) AT/AF episodes, max dur. 47mins 36--?PAT per markers and FFRW. No high ventricular rates noted. Patient educated about wound care, arm mobility, lifting restrictions. ROV in 6 weeks with AS and 3 months with SK.

## 2017-03-01 ENCOUNTER — Ambulatory Visit: Payer: Medicare Other | Attending: Family Medicine | Admitting: Family Medicine

## 2017-03-01 ENCOUNTER — Encounter: Payer: Self-pay | Admitting: Family Medicine

## 2017-03-01 VITALS — BP 90/55 | HR 101 | Temp 98.0°F | Resp 18 | Ht 63.0 in | Wt 101.4 lb

## 2017-03-01 DIAGNOSIS — Z880 Allergy status to penicillin: Secondary | ICD-10-CM | POA: Insufficient documentation

## 2017-03-01 DIAGNOSIS — I441 Atrioventricular block, second degree: Secondary | ICD-10-CM | POA: Insufficient documentation

## 2017-03-01 DIAGNOSIS — Z48812 Encounter for surgical aftercare following surgery on the circulatory system: Secondary | ICD-10-CM | POA: Insufficient documentation

## 2017-03-01 DIAGNOSIS — I4510 Unspecified right bundle-branch block: Secondary | ICD-10-CM | POA: Diagnosis not present

## 2017-03-01 DIAGNOSIS — F329 Major depressive disorder, single episode, unspecified: Secondary | ICD-10-CM | POA: Diagnosis not present

## 2017-03-01 DIAGNOSIS — D573 Sickle-cell trait: Secondary | ICD-10-CM | POA: Diagnosis not present

## 2017-03-01 DIAGNOSIS — G7111 Myotonic muscular dystrophy: Secondary | ICD-10-CM | POA: Insufficient documentation

## 2017-03-01 DIAGNOSIS — Z950 Presence of cardiac pacemaker: Secondary | ICD-10-CM | POA: Diagnosis not present

## 2017-03-01 NOTE — Progress Notes (Signed)
Subjective:  Patient ID: Cynthia Underwood, female    DOB: 05-Jan-1971  Age: 46 y.o. MRN: [id number]  CC: Hospitalization Follow-up (pacemaker placed )   HPI Cynthia Underwood is a 46 year old female with a history of myotonic dystrophy, cardiac conduction abnormalities including 2:1 second-degree heart block who presents today for hospital follow-up status post pacemaker placement.  She was hospitalized from 02/15/17 through 02/16/17 where she underwent implantation of a MDT dual-chamber pacemaker and tolerated the procedure well. She has been to cardiology for a wound check and denies any pain at site. Upcoming appointment is in 6 weeks.  Her myotonic dystrophy managed by neurology and she denies any pain at this time. In the past she has had stiffness of her hands, fatigue, lockjaw, shortness of breath. Takes Cymbalta chronically which she receives from neurology. She has no acute concerns today.  Past Medical History:  Diagnosis Date  . Anemia   . Bifascicular block 10/02/2014  . Depression   . Excess ear wax   . Myotonic dystrophy, type 1 ([place]) dx'd ~ 2005   Genetically confirmed  . Presence of permanent cardiac pacemaker   . 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 Sickle cell trait ([place])   . Watery eyes    Left  . Weakness of both legs     Past Surgical History:  Procedure Laterality Date  . BREAST BIOPSY Right   . EYE SURGERY Left 2017   "related to blockage in my nose"  . INSERT / REPLACE / REMOVE PACEMAKER  02/15/2017  . PACEMAKER IMPLANT N/A 02/15/2017   Procedure: Pacemaker Implant;  Surgeon: Deboraha Sprang, MD;  Location: [facility];  Service: Cardiovascular;  Laterality: N/A;    Allergies  Allergen Reactions  . Penicillins Nausea And Vomiting, Rash and Other (See Comments)    Fever Has patient had a PCN reaction causing immediate rash, facial/tongue/throat swelling, SOB or lightheadedness with hypotension:  Yes Has patient had a PCN reaction causing severe rash involving mucus membranes or skin necrosis: Unknown Has patient had a PCN reaction that required hospitalization: No Has patient had a PCN reaction occurring within the last 10 years: No If all of the above answers are "NO", then may proceed with Cephalosporin use.      Outpatient Medications Prior to Visit  Medication Sig Dispense Refill  . DULoxetine (CYMBALTA) 30 MG capsule Take 1 capsule (30 mg total) by mouth daily. 90 capsule 3   No facility-administered medications prior to visit.     ROS Review of Systems  Constitutional: Negative for activity change, appetite change and fatigue.  HENT: Negative for congestion, sinus pressure and sore throat.   Eyes: Negative for visual disturbance.  Respiratory: Negative for cough, chest tightness, shortness of breath and wheezing.   Cardiovascular: Negative for chest pain and palpitations.  Gastrointestinal: Negative for abdominal distention, abdominal pain and constipation.  Endocrine: Negative for polydipsia.  Genitourinary: Negative for dysuria and frequency.  Musculoskeletal: Negative for arthralgias and back pain.  Skin: Negative for rash.  Neurological: Negative for tremors, light-headedness and numbness.  Hematological: Does not bruise/bleed easily.  Psychiatric/Behavioral: Negative for agitation and behavioral problems.    Objective:  BP (!) 90/55 (BP Location: Right Arm, Patient Position: Sitting, Cuff Size: Normal)   Pulse (!) 101   Temp 98 F (36.7 C) (Oral)   Resp 18   Ht 5\' 3"  (1.6 m)   Wt 101 lb 6.4 oz (46 kg)  LMP 12/03/2016 (Approximate)   SpO2 97%   BMI 17.96 kg/m   BP/Weight 03/01/2017 02/16/2017 9/47/1252  Systolic BP 90 712 -  Diastolic BP 55 60 -  Wt. (Lbs) 101.4 108.25 -  BMI 17.96 - 19.17      Physical Exam  Constitutional: She is oriented to person, place, and time. She appears well-developed and well-nourished.  Cardiovascular: Normal  heart sounds and intact distal pulses.  Tachycardia present.   No murmur heard. Pulmonary/Chest: Effort normal and breath sounds normal. She has no wheezes. She has no rales. She exhibits no tenderness.  Pacemaker in left upper chest wall  Abdominal: Soft. Bowel sounds are normal. She exhibits no distension and no mass. There is no tenderness.  Musculoskeletal: Normal range of motion.  Neurological: She is alert and oriented to person, place, and time.  Skin: Skin is warm and dry.  Psychiatric: She has a normal mood and affect.     Assessment & Plan:   1. Myotonic dystrophy, type 1 ([place]) Patient is not in any pain at this time Takes Cymbalta chronically. Closely followed very neurology  2. Mobitz type 2 second degree heart block Status post cardiac pacemaker Stable  3. Cardiac pacemaker in situ Has had one check During well Keep appointment with EP cardiology   No orders of the defined types were placed in this encounter.   Follow-up: Return in about 6 months (around 08/31/2017) for follow up of chronic medical conditions.   [name] MD

## 2017-03-16 ENCOUNTER — Telehealth: Payer: Self-pay | Admitting: Internal Medicine

## 2017-03-16 NOTE — Telephone Encounter (Signed)
Will route Combine to f/u on.

## 2017-03-16 NOTE — Telephone Encounter (Signed)
refaxed order from 02/07/2017 along with sleep study from 11/01/2014 which is the only sleep study that we have. I rec'd confirmation from the fax that all 10 pages went through

## 2017-03-22 ENCOUNTER — Telehealth: Payer: Self-pay | Admitting: Internal Medicine

## 2017-03-22 NOTE — Telephone Encounter (Signed)
MR  Please advise-  Spoke with [employer] who needed to clarify what the patient needed the bipap for. Informed her that this was not for osa, but for hypercapnia. She wanted to assure of which machine you wanted for the patient. You wrote in phone note 01/17/17 that you wanted her to be on bipap but she wanted to know did you consider her being on Trilogy, she stats it is non invasive,and helps manages pt's CO2.   FYI to nurse: called pt because she states she was unaware as to why she needed this machine, states she told this was for osa explained to her what this was for and she states she is ok with going on the machine if she needs it.

## 2017-03-22 NOTE — Telephone Encounter (Signed)
Chrys Racer returning call - she can be reached at [phone] -pr

## 2017-03-22 NOTE — Telephone Encounter (Signed)
I have called and lmomtcb x 1 for [place] with [employer]

## 2017-03-27 NOTE — Telephone Encounter (Signed)
Spoke with Chrys Racer w/ [employer] and made her aware of MR's below message.  [place] will be faxing over order from for MR to fill out with settings.   Will route to [place] to f/u on.

## 2017-03-27 NOTE — Telephone Encounter (Signed)
Yes if they can go with non invasive triology is better. Let them know patient does NOT have a trach. So has to have a bipap mask  Yes this is not for OSA but for neurmouscular weakness related hypercarbia  Dr. Brand Males, M.D., [facility].C.P Pulmonary and Critical Care Medicine Staff Physician [place] [facility] Pager: [phone], If no answer or between  15:00h - 7:00h: call [phone]  03/27/2017 8:25 AM   g

## 2017-03-28 ENCOUNTER — Telehealth: Payer: Self-pay | Admitting: Internal Medicine

## 2017-03-28 DIAGNOSIS — H2512 Age-related nuclear cataract, left eye: Secondary | ICD-10-CM | POA: Diagnosis not present

## 2017-03-28 DIAGNOSIS — H25042 Posterior subcapsular polar age-related cataract, left eye: Secondary | ICD-10-CM | POA: Diagnosis not present

## 2017-03-28 DIAGNOSIS — H25812 Combined forms of age-related cataract, left eye: Secondary | ICD-10-CM | POA: Diagnosis not present

## 2017-03-28 NOTE — Telephone Encounter (Signed)
Called and spoke to [place] with [employer]. Chrys Racer states she needs pt's ABGs for insurance to approve bipap. ABG has been faxed to [place] at [phone]. Chrys Racer verbalized understanding and denied any further questions or concerns at this time.

## 2017-03-28 NOTE — Telephone Encounter (Signed)
Chrys Racer w/[employer] dropped form off, form placed in MR folder at the check out area...ert

## 2017-03-29 NOTE — Telephone Encounter (Signed)
Verified with Daneil Dan that form was indeed received and placed into MR's lookat Forwarding to MR to make him aware form needs to be signed Thank you

## 2017-04-02 NOTE — Telephone Encounter (Signed)
Called spoke with Chrys Racer w/ [employer] to let her know that we have the form and it is awaiting MR's signature Chrys Racer voiced her understanding Forwarding to MR and Daneil Dan to ensure follow up

## 2017-04-02 NOTE — Telephone Encounter (Signed)
Chrys Racer from [place] came by the office checking on status of order. She can be reached at [phone] -pr

## 2017-04-03 NOTE — Progress Notes (Signed)
Electrophysiology Office Note Date: 04/05/2017  ID:  [name], [name] 07/07/1971, MRN [id number]  PCP: [name], MD Primary Cardiologist: Radford Pax Electrophysiologist: Caryl Comes  CC: Pacemaker follow-up  Cynthia Underwood is a 46 y.o. female seen today for Dr Caryl Comes.  She presents today for routine electrophysiology followup.  Since last being seen in our clinic, the patient reports doing very well.  She denies chest pain, palpitations, dyspnea, PND, orthopnea, nausea, vomiting, dizziness, syncope, edema, weight gain, or early satiety.  Device History: MDT dual chamber PPM (His Bundle) implanted 2018 for high grade heart block   Past Medical History:  Diagnosis Date  . Anemia   . Bifascicular block 10/02/2014  . Depression   . Excess ear wax   . 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 Presence of permanent cardiac pacemaker   . 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 Sickle cell trait ([place])   . Watery eyes    Left  . Weakness of both legs    Past Surgical History:  Procedure Laterality Date  . BREAST BIOPSY Right   . EYE SURGERY Left 2017   "related to blockage in my nose"  . INSERT / REPLACE / REMOVE PACEMAKER  02/15/2017  . PACEMAKER IMPLANT N/A 02/15/2017   Procedure: Pacemaker Implant;  Surgeon: Deboraha Sprang, MD;  Location: [facility];  Service: Cardiovascular;  Laterality: N/A;    Current Outpatient Prescriptions  Medication Sig Dispense Refill  . DULoxetine (CYMBALTA) 30 MG capsule Take 1 capsule (30 mg total) by mouth daily. 90 capsule 3  . moxifloxacin (VIGAMOX) 0.5 % ophthalmic solution Place 1 drop into the left eye 4 (four) times daily.    . prednisoLONE acetate (PRED FORTE) 1 % ophthalmic suspension Place 1 drop into the left eye 4 (four) times daily.     No current facility-administered medications for this visit.     Allergies:   Penicillins   Social  History: Social History   Social History  . Marital status: Single    Spouse name: N/A  . Number of children: 0  . Years of education: 1.5 Collge   Occupational History  . Unemployed Other   Social History Main Topics  . Smoking status: Former Smoker    Packs/day: 0.00    Years: 28.00    Types: Cigarettes    Quit date: 10/04/2016  . Smokeless tobacco: Never Used  . Alcohol use 0.0 oz/week     Comment: 02/15/2017 "maybe 6 times/year"  . Drug use: Yes    Frequency: 2.0 times per week    Types: Marijuana     Comment: 02/15/2017 "couple times/month"  . Sexual activity: Yes    Birth control/ protection: None, Condom   Other Topics Concern  . Not on file   Social History Narrative   Patient lives at home with her aunt.   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  She moved to [facility] in August 2015.   She has been on disability since 2010.   Education: 1 1/2 years of college.       Family History: Family History  Problem Relation Age of Onset  . Cancer Father 1       Deceased  . Heart disease Father   . COPD Mother   . Diabetes Maternal Uncle   . Heart disease Maternal Uncle   . Heart disease Paternal Grandmother   .  Diabetes Paternal Grandmother   . Hypertension Paternal Grandmother   . Neuromuscular disorder Maternal Aunt        Myotonic dystrophy  . Neuromuscular disorder Cousin        Myotonic dystrophy  . Neuromuscular disorder Sister        Myotonic dystrophy     Review of Systems: All other systems reviewed and are otherwise negative except as noted above.   Physical Exam: VS:  BP 102/70   Pulse 60   Ht 5\' 3"  (1.6 m)   Wt 102 lb 9.6 oz (46.5 kg)   BMI 18.17 kg/m  , BMI Body mass index is 18.17 kg/m.  GEN- The patient is well appearing, alert and oriented x 3 today.   HEENT: normocephalic, atraumatic; sclera clear, conjunctiva pink; hearing intact; oropharynx clear; neck supple Lungs- Clear to ausculation bilaterally, normal  work of breathing.  No wheezes, rales, rhonchi Heart- Regular rate and rhythm, no murmurs, rubs or gallops  GI- soft, non-tender, non-distended, bowel sounds present  Extremities- no clubbing, cyanosis, or edema  MS- no significant deformity or atrophy Skin- warm and dry, no rash or lesion; PPM pocket well healed Psych- euthymic mood, full affect Neuro- strength and sensation are intact  PPM Interrogation- reviewed in detail today,  See PACEART report  EKG:  EKG is ordered today. The ekg ordered today shows atrial pacing with intrinsic ventricular conduction  Recent Labs: 01/05/2017: ALT 14 01/09/2017: TSH 0.30 02/04/2017: BUN 13; Creatinine, Ser 0.58; Hemoglobin 12.7; Platelets 278; Potassium 4.2; Sodium 142   Wt Readings from Last 3 Encounters:  04/05/17 102 lb 9.6 oz (46.5 kg)  03/01/17 101 lb 6.4 oz (46 kg)  02/16/17 108 lb 3.9 oz (49.1 kg)     Other studies Reviewed: Additional studies/ records that were reviewed today include: Dr Olin Pia notes, hospital records  Assessment and Plan:  1.  High grade heart block Normal PPM function See Pace Art report No changes today His Bundle capture septal down to loss of capture at 0.5V@1msec  Pacemaker programmed MVP    Current medicines are reviewed at length with the patient today.   The patient does not have concerns regarding her medicines.  The following changes were made today:  none  Labs/ tests ordered today include: none No orders of the defined types were placed in this encounter.    Disposition:   Follow up with Dr Caryl Comes as scheduled    Signed, Chanetta Marshall, NP 04/05/2017 11:59 AM  [facility] [place] [address] [phone] (office) [phone] (fax

## 2017-04-05 ENCOUNTER — Encounter: Payer: Self-pay | Admitting: Nurse Practitioner

## 2017-04-05 ENCOUNTER — Ambulatory Visit (INDEPENDENT_AMBULATORY_CARE_PROVIDER_SITE_OTHER): Payer: Medicare Other | Admitting: Nurse Practitioner

## 2017-04-05 ENCOUNTER — Encounter (INDEPENDENT_AMBULATORY_CARE_PROVIDER_SITE_OTHER): Payer: Self-pay

## 2017-04-05 VITALS — BP 102/70 | HR 60 | Ht 63.0 in | Wt 102.6 lb

## 2017-04-05 DIAGNOSIS — I441 Atrioventricular block, second degree: Secondary | ICD-10-CM | POA: Diagnosis not present

## 2017-04-05 LAB — CUP PACEART INCLINIC DEVICE CHECK
Implantable Lead Implant Date: 20180614
Implantable Lead Implant Date: 20180614
Implantable Lead Model: 3830
Implantable Lead Model: 5076
Implantable Pulse Generator Implant Date: 20180614
MDC IDC LEAD LOCATION: 753859
MDC IDC LEAD LOCATION: 753859
MDC IDC SESS DTM: 20180802120142

## 2017-04-05 NOTE — Patient Instructions (Signed)
Medication Instructions:  Your physician recommends that you continue on your current medications as directed. Please refer to the Current Medication list given to you today.   Labwork: None Ordered   Testing/Procedures: None Ordered   Follow-Up: Your physician recommends that you schedule a follow-up appointment in: as scheduled with Dr. Klein.  Any Other Special Instructions Will Be Listed Below (If Applicable).     If you need a refill on your cardiac medications before your next appointment, please call your pharmacy.   

## 2017-04-09 NOTE — Telephone Encounter (Signed)
Spoke with Daneil Dan, forms have been given to MR to be addressed. MR will be back in office this week.  Chrys Racer with [employer] has been made aware.

## 2017-04-10 NOTE — Telephone Encounter (Signed)
Will send to Daneil Dan to ensure these are signed -- MR is in office this morning.

## 2017-04-11 NOTE — Telephone Encounter (Signed)
Cynthia Underwood were these form signed by MR? Thanks.

## 2017-04-12 ENCOUNTER — Telehealth: Payer: Self-pay | Admitting: Internal Medicine

## 2017-04-12 NOTE — Telephone Encounter (Signed)
Form has already been received and MR is working on the order. Please refer to phone note from 7.19.18. Will sign off.

## 2017-04-13 NOTE — Telephone Encounter (Signed)
Will need to have MR fill out the settings on the form.   MR please advise - there are 4 different types of settings and sub settings within those 4 settings. Form has been placed in look-at cubby.

## 2017-04-13 NOTE — Telephone Encounter (Signed)
Daneil Dan please advise. Thanks

## 2017-04-16 NOTE — Telephone Encounter (Signed)
MR please advise. Thanks! 

## 2017-04-20 NOTE — Telephone Encounter (Signed)
MR please advise. Thanks! 

## 2017-04-23 NOTE — Telephone Encounter (Signed)
MR please advise thanks  

## 2017-04-25 NOTE — Telephone Encounter (Signed)
EE please advise if these forms were filled out by MR.  Thanks

## 2017-04-26 NOTE — Telephone Encounter (Signed)
Please advise if this has been signed yet, thanks

## 2017-04-30 NOTE — Telephone Encounter (Signed)
Has this been taken care of?

## 2017-05-01 NOTE — Telephone Encounter (Signed)
MR please advise on the settings on the forms in your look at folder.

## 2017-05-03 NOTE — Telephone Encounter (Signed)
MR please advise on these settings.

## 2017-05-08 NOTE — Telephone Encounter (Signed)
EE please advise if this message can be closed. Thanks

## 2017-05-10 NOTE — Telephone Encounter (Signed)
Done and given to [facility]  Dr. Brand Males, M.D., [facility].C.P Pulmonary and Critical Care Medicine Staff Physician [place] [facility] Pager: [phone], If no answer or between  15:00h - 7:00h: call [phone]  05/10/2017 5:34 PM

## 2017-05-11 NOTE — Telephone Encounter (Signed)
Daneil Dan please advise if this message can be closed out.  Thanks!

## 2017-05-14 NOTE — Telephone Encounter (Signed)
Form has been completed by MR and faxed. Called and spoke to [place] with [employer] and advised her of the form. She states she is going to stop by today and pick up a copy. This has been left with Sherrine Maples is aware. Original form will be sent to scan. Will sign off.

## 2017-05-15 ENCOUNTER — Encounter (INDEPENDENT_AMBULATORY_CARE_PROVIDER_SITE_OTHER): Payer: Self-pay

## 2017-05-15 ENCOUNTER — Ambulatory Visit (INDEPENDENT_AMBULATORY_CARE_PROVIDER_SITE_OTHER): Payer: Medicare Other | Admitting: Internal Medicine

## 2017-05-15 VITALS — BP 100/60 | HR 59 | Ht 63.0 in | Wt 105.0 lb

## 2017-05-15 DIAGNOSIS — Z950 Presence of cardiac pacemaker: Secondary | ICD-10-CM

## 2017-05-15 DIAGNOSIS — G7111 Myotonic muscular dystrophy: Secondary | ICD-10-CM

## 2017-05-15 DIAGNOSIS — I441 Atrioventricular block, second degree: Secondary | ICD-10-CM

## 2017-05-15 DIAGNOSIS — I452 Bifascicular block: Secondary | ICD-10-CM | POA: Diagnosis not present

## 2017-05-15 NOTE — Patient Instructions (Signed)
Medication Instructions: - Your physician recommends that you continue on your current medications as directed. Please refer to the Current Medication list given to you today.  Labwork: - none ordered  Procedures/Testing: - none ordered  Follow-Up: - Remote monitoring is used to monitor your Pacemaker of ICD from home. This monitoring reduces the number of office visits required to check your device to one time per year. It allows [place] to keep an eye on the functioning of your device to ensure it is working properly. You are scheduled for a device check from home on 08/14/17. You may send your transmission at any time that day. If you have a wireless device, the transmission will be sent automatically. After your physician reviews your transmission, you will receive a postcard with your next transmission date.  - Your physician wants you to follow-up in: 9 months with Chanetta Marshall, NP for Dr. Caryl Comes. You will receive a reminder letter in the mail two months in advance. If you don't receive a letter, please call our office to schedule the follow-up appointment.   Any Additional Special Instructions Will Be Listed Below (If Applicable).     If you need a refill on your cardiac medications before your next appointment, please call your pharmacy.

## 2017-05-15 NOTE — Progress Notes (Signed)
      Patient Care Team: [name], MD as PCP - General (Family Medicine)   HPI  Cynthia Underwood is a 46 y.o. female Seen in followup for His Bundle pacing for high grade block in the setting of myotonic dystrophy  Denies chest pain or shorthness of breath.  She has noted no change in exercise tolerance   Records and Results Reviewed   Past Medical History:  Diagnosis Date  . Anemia   . Bifascicular block 10/02/2014  . Depression   . Excess ear wax   . 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 Presence of permanent cardiac pacemaker   . 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 Sickle cell trait ([place])   . Watery eyes    Left  . Weakness of both legs     Past Surgical History:  Procedure Laterality Date  . BREAST BIOPSY Right   . EYE SURGERY Left 2017   "related to blockage in my nose"  . INSERT / REPLACE / REMOVE PACEMAKER  02/15/2017  . PACEMAKER IMPLANT N/A 02/15/2017   Procedure: Pacemaker Implant;  Surgeon: Deboraha Sprang, MD;  Location: [facility];  Service: Cardiovascular;  Laterality: N/A;    Current Outpatient Prescriptions  Medication Sig Dispense Refill  . DULoxetine (CYMBALTA) 30 MG capsule Take 1 capsule (30 mg total) by mouth daily. 90 capsule 3   No current facility-administered medications for this visit.     Allergies  Allergen Reactions  . Penicillins Nausea And Vomiting, Rash and Other (See Comments)    Fever Has patient had a PCN reaction causing immediate rash, facial/tongue/throat swelling, SOB or lightheadedness with hypotension: Yes Has patient had a PCN reaction causing severe rash involving mucus membranes or skin necrosis: Unknown Has patient had a PCN reaction that required hospitalization: No Has patient had a PCN reaction occurring within the last 10 years: No If all of the above answers are "NO", then may proceed with Cephalosporin use.        Review of Systems negative except from HPI and PMH  Physical Exam BP 100/60   Pulse (!) 59   Ht 5\' 3"  (1.6 m)   Wt 105 lb (47.6 kg)   SpO2 96%   BMI 18.60 kg/m  Well developed and well nourished in no acute distress HENT normal E scleral and icterus clear Neck Supple JVP flat; carotids brisk and full Clear to ausculation Device pocket well healed; without hematoma or erythema.  There is no tethering  Regular rate and rhythm, no murmurs gallops or rub Soft with active bowel sounds No clubbing cyanosis  Edema Alert and oriented, grossly normal motor and sensory function Skin Warm and Dry  ECG  AV pacing  Assessment and  Plan Myotonic dystrophy  Sinus node dysfunction  MBZ 2 AV block  Pacer Medtronic HIS Bundle with septal capture    Normal device function with mostly septal capture  Unsuspected sinus node dysfunction  .normal device function      Current medicines are reviewed at length with the patient today .  The patient does not  have concerns regarding medicines.

## 2017-05-17 LAB — CUP PACEART INCLINIC DEVICE CHECK
Battery Remaining Longevity: 152 mo
Battery Voltage: 3.16 V
Brady Statistic AP VS Percent: 39.74 %
Brady Statistic AS VP Percent: 28.74 %
Brady Statistic AS VS Percent: 26.59 %
Brady Statistic RA Percent Paced: 44.46 %
Date Time Interrogation Session: 20180911191331
Implantable Lead Model: 5076
Implantable Pulse Generator Implant Date: 20180614
Lead Channel Impedance Value: 266 Ohm
Lead Channel Impedance Value: 380 Ohm
Lead Channel Pacing Threshold Amplitude: 0.75 V
Lead Channel Sensing Intrinsic Amplitude: 5.875 mV
Lead Channel Setting Pacing Amplitude: 2 V
Lead Channel Setting Pacing Pulse Width: 1 ms
MDC IDC LEAD IMPLANT DT: 20180614
MDC IDC LEAD IMPLANT DT: 20180614
MDC IDC LEAD LOCATION: 753859
MDC IDC LEAD LOCATION: 753859
MDC IDC MSMT LEADCHNL RA IMPEDANCE VALUE: 361 Ohm
MDC IDC MSMT LEADCHNL RA PACING THRESHOLD AMPLITUDE: 0.75 V
MDC IDC MSMT LEADCHNL RA PACING THRESHOLD PULSEWIDTH: 0.4 ms
MDC IDC MSMT LEADCHNL RA SENSING INTR AMPL: 1.5 mV
MDC IDC MSMT LEADCHNL RV IMPEDANCE VALUE: 551 Ohm
MDC IDC MSMT LEADCHNL RV PACING THRESHOLD PULSEWIDTH: 0.4 ms
MDC IDC SET LEADCHNL RV PACING AMPLITUDE: 2 V
MDC IDC SET LEADCHNL RV SENSING SENSITIVITY: 2 mV
MDC IDC STAT BRADY AP VP PERCENT: 4.93 %
MDC IDC STAT BRADY RV PERCENT PACED: 33.67 %

## 2017-07-16 ENCOUNTER — Ambulatory Visit (INDEPENDENT_AMBULATORY_CARE_PROVIDER_SITE_OTHER): Payer: Medicare Other | Admitting: Neurology

## 2017-07-16 ENCOUNTER — Encounter: Payer: Self-pay | Admitting: Neurology

## 2017-07-16 VITALS — BP 110/70 | HR 101 | Ht 63.0 in | Wt 108.5 lb

## 2017-07-16 DIAGNOSIS — F329 Major depressive disorder, single episode, unspecified: Secondary | ICD-10-CM | POA: Diagnosis not present

## 2017-07-16 DIAGNOSIS — G7111 Myotonic muscular dystrophy: Secondary | ICD-10-CM

## 2017-07-16 DIAGNOSIS — F32A Depression, unspecified: Secondary | ICD-10-CM

## 2017-07-16 MED ORDER — MIRTAZAPINE 7.5 MG PO TABS: 7.5000 mg | ORAL_TABLET | Freq: Every day | ORAL | 5 refills | Status: DC

## 2017-07-16 NOTE — Patient Instructions (Addendum)
Start Remeron 7.5mg  at bedtime for mood and to stimulate appetite  Return to clinic in 6 months

## 2017-07-16 NOTE — Progress Notes (Signed)
Follow-up Visit   Date: 07/16/17    Cynthia Underwood MRN: [id number] DOB: 1970-09-20   Interim History: Cynthia Underwood is a 46 y.o. 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  The patient was accompanied to the clinic by self.  History of present illness: Starting around the age of 36, she started having lock jaw and stiffness of the hands. Over the years, she developed worsening stiffness of her fingers and hands. Because her maternal aunt, who is also a patient of mine, had known myotonic dystrophy, she ultimately was genetically tested at the age of 89 which confirmed the diagnosis of myotonic dystrophy type 1. She has a strong family history of DM1 including maternal aunt, cousins x 2, and younger sister. She does not have any children and has no future plans for pregnancy.  Symptoms were relatively stable until her mid-30s and then started developing fatigue, weakness, daytime sleepiness, and shortness of breath with exertion. She walks independently but was told previously told to use leg braces.  Over the past few years, she noticed intermittent difficulty swallowing liquids. She underwent barium swallow which reportedly showed signs of aspiration and she was recommended to use a straw and use chin tuck position.   She was seeing several neurologists over the years at [facility] in [place]. She is not working and has been on disability since 2010.  She established care with me in February 2016.  She had significant leg weakness where she had to lift her left leg out of the car because it is so weak.  She feels that she no longer has any good days, they are either "bad or worse".  In fall of 2016, she had to move her bedroom to the [place] because of difficulty climbing stairs.  Since completing home  PT/OT, her muscle strength has improved and sometimes, she is able to walk without AFO.  By 2017, she was able to climb stairs and went back to sleeping upstairs. She was started in Cymbalta 30mg  for mood, fatigue and myalgias which significantly helped, but self-discontinued this in 2018 due to feeling it was ineffective.    UPDATE 01/09/2017:  She reports having more fatigue and difficulty with her balance.  She has more bad days than good days. She was told by PT that she has hip weakness which limits her balance.  She is not compliant with home exercises because she has muscle pain which persists afterwards.  She continues to have mild dysarthria and dysphagia, denies any choking spells.  She continues to be followed by cardiology for abnormal EKG with RBBB and LAFB as well as myocardial thinning.  She is also undergoing pulmonology work-up for dyspnea, which is  suggestive of restrictive changes, consistent with muscular weakness due to DM1.  She complains of blurry vision and has not followed with an eye doctor in quite some time.  Mood has been down.  UPDATE 07/16/2017:  She is here for 6 month appointment.  She is constantly feeling tired and has no appetite. She has depressed mood and is not interested in seeing a counselor.  She has been treated for this in the past, but feels that people think she is depressed because of her disease, where she really is not bothered by the fact that she has DM1 and has come to terms with this.  She does not specify exactly what is making her feel depressed.  Of note, during the summer, she had left cataract surgery and had dual chamber PPM implanted by Dr. Caryl Comes. She denies any new problems with swallowing or talking. She complains of new sore throat and headache for the past few days.       Medications:  Current Outpatient Medications on File Prior to Visit  Medication Sig Dispense Refill  . DULoxetine (CYMBALTA) 30 MG capsule Take 1 capsule (30 mg total) by mouth  daily. (Patient not taking: Reported on 07/16/2017) 90 capsule 3   No current facility-administered medications on file prior to visit.     Allergies:  Allergies  Allergen Reactions  . Penicillins Nausea And Vomiting, Rash and Other (See Comments)    Fever Has patient had a PCN reaction causing immediate rash, facial/tongue/throat swelling, SOB or lightheadedness with hypotension: Yes Has patient had a PCN reaction causing severe rash involving mucus membranes or skin necrosis: Unknown Has patient had a PCN reaction that required hospitalization: No Has patient had a PCN reaction occurring within the last 10 years: No If all of the above answers are "NO", then may proceed with Cephalosporin use.     Review of Systems:  CONSTITUTIONAL: No fevers, chills, night sweats, or weight loss.  EYES: No visual changes or eye pain ENT: No hearing changes.  No history of nose bleeds.   RESPIRATORY: No cough, wheezing and shortness of breath.   CARDIOVASCULAR: Negative for chest pain, and palpitations.   GI: Negative for abdominal discomfort, blood in stools or black stools.  No recent change in bowel habits.   GU:  No history of incontinence.   MUSCLOSKELETAL: No history of joint pain or swelling.  No myalgias.   SKIN: Negative for lesions, rash, and itching.   ENDOCRINE: Negative for cold or heat intolerance, polydipsia or goiter.   PSYCH:  +depression or anxiety symptoms.   NEURO: As Above.   Vital Signs:  BP 110/70   Pulse (!) 101   Ht 5\' 3"  (1.6 m)   Wt 108 lb 8 oz (49.2 kg)   SpO2 95%   BMI 19.22 kg/m   Neurological Exam: MENTAL STATUS including orientation to time, place, person, recent and remote memory, attention span and concentration, language, and fund of knowledge is normal.  Speech is mildly dysarthric and hypophonic.  Mood is down and she does not engage in conversation.  CRANIAL NERVES: Pupils equal round and reactive to light.. Normal conjugate, extra-ocular eye  movements in all directions of gaze. Mild bilateral ptosis. Typical mytonic facies with temporal wasting and transverse smile. Mild facial weakness as noted frontalis and oribicularis oculi and oris.  MOTOR:  Generalized loss of muscle bulk. Mild grip myotonia bilaterally. Tone is normal.   Right Upper Extremity:    Left Upper Extremity:    Deltoid  5-/5   Deltoid  5-/5   Biceps  5-/5   Biceps  5-/5   Triceps  5-/5   Triceps  5-/5   Wrist extensors  5/5   Wrist extensors  5/5   Wrist flexors  5/5   Wrist flexors  5/5   Finger extensors  4/5   Finger extensors  4/5   Finger flexors  4/5   Finger flexors  4/5   Dorsal interossei  4/5   Dorsal interossei  4/5   Abductor pollicis  4/5   Abductor pollicis  4/5   Tone (Ashworth scale)  0  Tone (Ashworth  scale)  0   Right Lower Extremity:    Left Lower Extremity:    Hip flexors  4/5   Hip flexors  4/5   Hip extensors  4/5   Hip extensors  4/5   Knee flexors  5/5   Knee flexors  5/5   Knee extensors  5/5   Knee extensors  5/5   Dorsiflexors  4/5   Dorsiflexors  4/5   Plantarflexors  5-/5   Plantarflexors  5-/5   Toe extensors  5-/5   Toe extensors  5-/5   Toe flexors  5-/5   Toe flexors  5-/5   Tone (Ashworth scale)  0  Tone (Ashworth scale)  0    COORDINATION/GAIT:   Gait is normal.  She is able to stand to rise from low chair without using arms to push off   Data: Echocardiogram 10/08/2014:  Normal   Lab Results  Component Value Date   HGBA1C 5.7 01/09/2017   Lab Results  Component Value Date   TSH 0.30 (L) 01/09/2017   Lab Results  Component Value Date   VITAMINB12 400 01/09/2017    IMPRESSION/PLAN: 1.  Myotonic dystrophy type I, genetically confirmed and with strong family history.  This has been complicated by cataracts, cardiac arrhythmia, dypnea, and dysphagia. She has typical myotonic facies and generalized weakness, especially in the lower extremities.  She has completed PT over the years, with variable benefit. I  encouraged her to continue her home exercises and use AFOs. I have reviewed her annual screening for diabetes which is normal. She is followed by ophthalmology and underwent left cataract surgery in July. She continues to be followed by cardiology and pulmonology, whose care is appreciated. She underwent MRI compatible PPM in June 2018.   2.  Depression, start Remeron 7.5mg  at bedtime in hopes this will help mood and stimulate appetite/weight gain  3. Smoking cessation instruction/counseling given:  commended patient for quitting and reviewed strategies for preventing relapses  4.  Upper viral illness. Recommend supportive care with OTC medications, hydration, and rest.  Return to clinic in 6 months  Greater than 50% of this 25 minute visit was spent in counseling, explanation of diagnosis, planning of further management, and coordination of care.   Thank you for allowing me to participate in patient's care.  If I can answer any additional questions, I would be pleased to do so.    Sincerely,    Pavel Gadd K. Posey Pronto, DO

## 2017-08-14 ENCOUNTER — Encounter: Payer: Medicare Other | Admitting: *Deleted

## 2017-08-17 ENCOUNTER — Encounter: Payer: Self-pay | Admitting: Cardiology

## 2017-08-29 DIAGNOSIS — D649 Anemia, unspecified: Secondary | ICD-10-CM | POA: Insufficient documentation

## 2017-08-29 DIAGNOSIS — R29898 Other symptoms and signs involving the musculoskeletal system: Secondary | ICD-10-CM | POA: Insufficient documentation

## 2017-08-29 DIAGNOSIS — D573 Sickle-cell trait: Secondary | ICD-10-CM | POA: Insufficient documentation

## 2017-08-29 DIAGNOSIS — Z950 Presence of cardiac pacemaker: Secondary | ICD-10-CM | POA: Insufficient documentation

## 2017-08-29 DIAGNOSIS — F32A Depression, unspecified: Secondary | ICD-10-CM | POA: Insufficient documentation

## 2017-08-29 DIAGNOSIS — F329 Major depressive disorder, single episode, unspecified: Secondary | ICD-10-CM | POA: Insufficient documentation

## 2017-08-29 DIAGNOSIS — H6120 Impacted cerumen, unspecified ear: Secondary | ICD-10-CM | POA: Insufficient documentation

## 2017-08-29 DIAGNOSIS — H04203 Unspecified epiphora, bilateral lacrimal glands: Secondary | ICD-10-CM | POA: Insufficient documentation

## 2017-09-16 NOTE — Progress Notes (Signed)
Cardiology Office Note:    Date:  09/17/2017   ID:  Cynthia Underwood, DOB 06-21-1971, MRN [id number]  PCP:  Cynthia Morale, MD  Cardiologist:  No primary care provider on file.    Referring MD: Cynthia Morale, MD   Chief Complaint  Patient presents with  . Follow-up    myotonic dystrophy, second degree AV block    History of Present Illness:    Cynthia Underwood is a 47 y.o. female with a hx of a history of myotonic dystrophy Type I since age 78 with intermittent jaw locking, finger stiffness and difficulty relaxing muscles.She was diagnosed by genetic testing. She has a family history of myotonic dystrophy and sarcoidosis with her Dad having a cardiac transplant. She is followed with Neurology. She has been followed with yearly echo.  Cardiac MRI showed myocardial thinning associated with hypokinesis and subendocardial late gadolinium enhancement in the mid inferior and inferolateral walls that was felt to represent either prior non-transmural infarct (low probability in this young female), cardiac sarcoidosis (subendocardial LGE is rare, more frequently there is midwall or subepicardial involvement). FDG-PET scan which showed no evidence of myocardial FDG uptake indicating that there was no evidence of sarcoidosis and the abnormality noted on cardiac MRI was likely due to myotonic dystrophy.    She has a history of RBBB and LAFB and tested positive for DM1 which increases risk of cardiac conduction disease and SCD.  She was referred to EP and found to have mobitz type 2 AV block and underwent MDT dual chamber HIS bundle pacer with septal capture and is followed by Dr. Caryl Underwood.  She was referred to Pulmonary and felt not to have asthma or lung tissue disease by high resolution Chest Ct.  It was felt that her SOB was likely due to neuromuscular disease and was placed on BiPAP due to hypercarbia.  Since she started the BiPAP her SOB has improved.    She is here today for followup and is doing  well.  Her SOB has essentially resolved after starting BiPAP at night.  She denies any chest pain or pressure, SOB, DOE, PND, orthopnea, LE edema, dizziness, palpitations or syncope. She is compliant with her meds and is tolerating meds with no SE.     Past Medical History:  Diagnosis Date  . Anemia   . Bifascicular block 10/02/2014  . Depression   . Excess ear wax   . 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 Presence of permanent cardiac pacemaker   . 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 Sickle cell trait ([place])   . Watery eyes    Left  . Weakness of both legs     Past Surgical History:  Procedure Laterality Date  . BREAST BIOPSY Right   . EYE SURGERY Left 2017   "related to blockage in my nose"  . INSERT / REPLACE / REMOVE PACEMAKER  02/15/2017  . PACEMAKER IMPLANT N/A 02/15/2017   Procedure: Pacemaker Implant;  Surgeon: Cynthia Sprang, MD;  Location: [facility];  Service: Cardiovascular;  Laterality: N/A;    Current Medications: No outpatient medications have been marked as taking for the 09/17/17 encounter (Office Visit) with Cynthia Margarita, MD.     Allergies:   Penicillins   Social History   Socioeconomic History  . Marital status: Single    Spouse name: None  . Number of children: 0  . Years  of education: 1.5 Collge  . Highest education level: None  Social Needs  . Financial resource strain: None  . Food insecurity - worry: None  . Food insecurity - inability: None  . Transportation needs - medical: None  . Transportation needs - non-medical: None  Occupational History  . Occupation: Unemployed    Employer: OTHER  Tobacco Use  . Smoking status: Former Smoker    Packs/day: 0.00    Years: 28.00    Pack years: 0.00    Types: Cigarettes    Last attempt to quit: 10/04/2016    Years since quitting: 0.9  . Smokeless tobacco: Never Used  Substance and Sexual Activity  . Alcohol  use: Yes    Alcohol/week: 0.0 oz    Comment: 02/15/2017 "maybe 6 times/year"  . Drug use: Yes    Frequency: 2.0 times per week    Types: Marijuana    Comment: 02/15/2017 "couple times/month"  . Sexual activity: Yes    Birth control/protection: None, Condom  Other Topics Concern  . None  Social History Narrative   Patient lives at home with her aunt.   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  She moved to [facility] in August 2015.   She has been on disability since 2010.   Education: 1 1/2 years of college.     Family History: The patient's family history includes COPD in her mother; Cancer (age of onset: 58) in her father; Diabetes in her maternal uncle and paternal grandmother; Heart disease in her father, maternal uncle, and paternal grandmother; Hypertension in her paternal grandmother; Neuromuscular disorder in her cousin, maternal aunt, and sister.  ROS:   Please see the history of present illness.    Review of Systems  Musculoskeletal: Positive for back pain.  Neurological: Positive for loss of balance.    All other systems reviewed and negative.   EKGs/Labs/Other Studies Reviewed:    The following studies were reviewed today: none  EKG:  EKG is not ordered today.   Recent Labs: 01/05/2017: ALT 14 01/09/2017: TSH 0.30 02/04/2017: BUN 13; Creatinine, Ser 0.58; Hemoglobin 12.7; Platelets 278; Potassium 4.2; Sodium 142   Recent Lipid Panel    Component Value Date/Time   CHOL 184 01/05/2017 0838   TRIG 65 01/05/2017 0838   HDL 67 01/05/2017 0838   CHOLHDL 2.7 01/05/2017 0838   CHOLHDL 3.5 08/26/2014 1516   VLDL 20 08/26/2014 1516   LDLCALC 104 (H) 01/05/2017 0838    Physical Exam:    VS:  BP (!) 86/61   Pulse 80   Ht 5\' 3"  (1.6 m)   Wt 115 lb 12.8 oz (52.5 kg)   BMI 20.51 kg/m     Wt Readings from Last 3 Encounters:  09/17/17 115 lb 12.8 oz (52.5 kg)  07/16/17 108 lb 8 oz (49.2 kg)  05/15/17 105 lb (47.6 kg)     GEN:  Well nourished, well  developed in no acute distress HEENT: Normal NECK: No JVD; No carotid bruits LYMPHATICS: No lymphadenopathy CARDIAC: RRR, no murmurs, rubs, gallops RESPIRATORY:  Clear to auscultation without rales, wheezing or rhonchi  ABDOMEN: Soft, non-tender, non-distended MUSCULOSKELETAL:  No edema; No deformity  SKIN: Warm and dry NEUROLOGIC:  Alert and oriented x 3 PSYCHIATRIC:  Normal affect   ASSESSMENT:    1. Myotonic dystrophy, type 1 ([place])   2. Mobitz type 2 second degree heart block   3. SOB (shortness of breath)    PLAN:    In  order of problems listed above:  1.  Myotonic dystrophy -  cardiac MRI showed myocardial thinning associated with hypokinesis and subendocardial late gadolinium enhancement in the mid inferior and inferolateral walls and cardiac FDG-PET scan at [facility] was normal and therefore her cardiac MRI findings were more likely to represent myotonic dystrophy than sarcoidosis. Ef was normal by echo 12/2016.  I will repeat her echo in April 2019 to make sure that LVF remains normal.   2.  RBBB with LAFB and high grade 2nd degree AV block s/p MDT dual chamber (HIS bundle) PPM - she is followed in device clinic.    3.  SOB - this was felt in the past to be due to underlying pulmonary etiology as her cardiac workup including MRI and echo showed normal LVF.  She was referred to Pulmonary and felt not to have asthma or lung tissue disease by high resolution Chest Ct.  It was felt that her SOB was likely due to neuromuscular disease and was placed on BiPAP due to hypercarbia.  Since she started the BiPAP her SOB has improved.       Medication Adjustments/Labs and Tests Ordered: Current medicines are reviewed at length with the patient today.  Concerns regarding medicines are outlined above.  No orders of the defined types were placed in this encounter.  No orders of the defined types were placed in this encounter.   Signed, Fransico Him, MD  09/17/2017 2:00 PM    [place]

## 2017-09-17 ENCOUNTER — Ambulatory Visit (INDEPENDENT_AMBULATORY_CARE_PROVIDER_SITE_OTHER): Payer: Medicare Other | Admitting: Cardiology

## 2017-09-17 ENCOUNTER — Encounter: Payer: Self-pay | Admitting: Cardiology

## 2017-09-17 VITALS — BP 86/61 | HR 80 | Ht 63.0 in | Wt 115.8 lb

## 2017-09-17 DIAGNOSIS — R0602 Shortness of breath: Secondary | ICD-10-CM | POA: Diagnosis not present

## 2017-09-17 DIAGNOSIS — G7111 Myotonic muscular dystrophy: Secondary | ICD-10-CM | POA: Diagnosis not present

## 2017-09-17 DIAGNOSIS — I441 Atrioventricular block, second degree: Secondary | ICD-10-CM | POA: Diagnosis not present

## 2017-09-17 NOTE — Patient Instructions (Signed)
Medication Instructions:  Your physician recommends that you continue on your current medications as directed. Please refer to the Current Medication list given to you today.  Labwork: None ordered   Testing/Procedures: Your physician has requested that you have an echocardiogram in September 2019. Echocardiography is a painless test that uses sound waves to create images of your heart. It provides your doctor with information about the size and shape of your heart and how well your heart's chambers and valves are working. This procedure takes approximately one hour. There are no restrictions for this procedure.   Follow-Up: Your physician wants you to follow-up in: 1 year with Dr. Radford Pax. You will receive a reminder letter in the mail two months in advance. If you don't receive a letter, please call our office to schedule the follow-up appointment.  Any Other Special Instructions Will Be Listed Below (If Applicable).     If you need a refill on your cardiac medications before your next appointment, please call your pharmacy.

## 2017-09-19 ENCOUNTER — Telehealth: Payer: Self-pay | Admitting: Internal Medicine

## 2017-09-19 NOTE — Telephone Encounter (Signed)
Left message for Cynthia Underwood to call back.

## 2017-09-20 NOTE — Telephone Encounter (Signed)
Spoke with Caitlyn at [facility]. I have given her the diagnosis codes that we have on file for the pt. She will let us know if these do not work. Nothing further was needed.

## 2017-11-02 DIAGNOSIS — H2511 Age-related nuclear cataract, right eye: Secondary | ICD-10-CM | POA: Diagnosis not present

## 2017-11-02 DIAGNOSIS — H5203 Hypermetropia, bilateral: Secondary | ICD-10-CM | POA: Diagnosis not present

## 2017-11-16 ENCOUNTER — Ambulatory Visit (INDEPENDENT_AMBULATORY_CARE_PROVIDER_SITE_OTHER): Payer: Medicare Other | Admitting: *Deleted

## 2017-11-16 ENCOUNTER — Telehealth: Payer: Self-pay | Admitting: Cardiology

## 2017-11-16 DIAGNOSIS — I441 Atrioventricular block, second degree: Secondary | ICD-10-CM

## 2017-11-16 NOTE — Telephone Encounter (Signed)
Spoke with pt and reminded pt of remote transmission that is due today. Pt verbalized understanding.   

## 2017-11-16 NOTE — Progress Notes (Signed)
Remote pacemaker transmission.   

## 2017-11-20 ENCOUNTER — Encounter: Payer: Self-pay | Admitting: Cardiology

## 2017-11-27 LAB — CUP PACEART REMOTE DEVICE CHECK
Battery Remaining Longevity: 135 mo
Brady Statistic AP VP Percent: 8.02 %
Brady Statistic AP VS Percent: 23.43 %
Brady Statistic AS VP Percent: 55.17 %
Brady Statistic AS VS Percent: 13.38 %
Brady Statistic RV Percent Paced: 63.19 %
Implantable Lead Implant Date: 20180614
Implantable Lead Implant Date: 20180614
Implantable Lead Location: 753859
Implantable Lead Model: 3830
Lead Channel Impedance Value: 323 Ohm
Lead Channel Impedance Value: 551 Ohm
Lead Channel Pacing Threshold Amplitude: 0.875 V
Lead Channel Pacing Threshold Pulse Width: 0.4 ms
Lead Channel Sensing Intrinsic Amplitude: 4.375 mV
Lead Channel Sensing Intrinsic Amplitude: 4.375 mV
Lead Channel Sensing Intrinsic Amplitude: 5.125 mV
Lead Channel Sensing Intrinsic Amplitude: 5.125 mV
Lead Channel Setting Pacing Amplitude: 2 V
Lead Channel Setting Pacing Pulse Width: 1 ms
Lead Channel Setting Sensing Sensitivity: 2 mV
MDC IDC LEAD LOCATION: 753859
MDC IDC MSMT BATTERY VOLTAGE: 3.08 V
MDC IDC MSMT LEADCHNL RA IMPEDANCE VALUE: 266 Ohm
MDC IDC MSMT LEADCHNL RA PACING THRESHOLD AMPLITUDE: 0.75 V
MDC IDC MSMT LEADCHNL RA PACING THRESHOLD PULSEWIDTH: 0.4 ms
MDC IDC MSMT LEADCHNL RV IMPEDANCE VALUE: 418 Ohm
MDC IDC PG IMPLANT DT: 20180614
MDC IDC SESS DTM: 20190315153710
MDC IDC SET LEADCHNL RA PACING AMPLITUDE: 2 V
MDC IDC STAT BRADY RA PERCENT PACED: 31.26 %

## 2017-11-28 ENCOUNTER — Telehealth: Payer: Self-pay | Admitting: Neurology

## 2017-11-28 NOTE — Telephone Encounter (Signed)
Pt called and wanted a sooner appointment with Dr Posey Pronto, she is having problems with her hands, please advise

## 2017-11-29 NOTE — Telephone Encounter (Signed)
Patient notified that we will put her on our urgent waiting list.

## 2017-12-10 ENCOUNTER — Other Ambulatory Visit: Payer: Self-pay | Admitting: Nurse Practitioner

## 2017-12-10 DIAGNOSIS — Z1231 Encounter for screening mammogram for malignant neoplasm of breast: Secondary | ICD-10-CM

## 2017-12-12 ENCOUNTER — Telehealth: Payer: Self-pay | Admitting: Neurology

## 2017-12-12 NOTE — Telephone Encounter (Signed)
Patient Cynthia Underwood needing to be seen sooner regarding her Hands getting worse. She is scheduled for May and on a Wait list. Please Advise. Thanks

## 2017-12-12 NOTE — Telephone Encounter (Signed)
Dr Posey Pronto-   Pt is already on the wait list. Do you want to see her prior to her 01/16/18 appt?

## 2017-12-13 NOTE — Telephone Encounter (Signed)
Patient was notified and will be here at 3:00 PM. Thanks

## 2017-12-13 NOTE — Telephone Encounter (Signed)
There is an opening tomorrow - Friday 4/12 at 3pm.

## 2017-12-14 ENCOUNTER — Encounter: Payer: Self-pay | Admitting: Neurology

## 2017-12-14 ENCOUNTER — Ambulatory Visit (INDEPENDENT_AMBULATORY_CARE_PROVIDER_SITE_OTHER): Payer: Medicare Other | Admitting: Neurology

## 2017-12-14 ENCOUNTER — Other Ambulatory Visit (INDEPENDENT_AMBULATORY_CARE_PROVIDER_SITE_OTHER): Payer: Medicare Other

## 2017-12-14 VITALS — BP 100/70 | HR 83 | Ht 63.0 in | Wt 134.0 lb

## 2017-12-14 DIAGNOSIS — G7111 Myotonic muscular dystrophy: Secondary | ICD-10-CM | POA: Diagnosis not present

## 2017-12-14 DIAGNOSIS — F329 Major depressive disorder, single episode, unspecified: Secondary | ICD-10-CM

## 2017-12-14 DIAGNOSIS — F32A Depression, unspecified: Secondary | ICD-10-CM

## 2017-12-14 DIAGNOSIS — M2550 Pain in unspecified joint: Secondary | ICD-10-CM | POA: Diagnosis not present

## 2017-12-14 LAB — SEDIMENTATION RATE: Sed Rate: 126 mm/h — ABNORMAL HIGH (ref 0–20)

## 2017-12-14 LAB — C-REACTIVE PROTEIN: CRP: 0.6 mg/dL (ref 0.5–20.0)

## 2017-12-14 MED ORDER — NAPROXEN 500 MG PO TABS: 500.0000 mg | ORAL_TABLET | Freq: Two times a day (BID) | ORAL | 0 refills | Status: DC | PRN

## 2017-12-14 NOTE — Progress Notes (Signed)
Follow-up Visit   Date: 12/14/17    Cynthia Underwood MRN: [id number] DOB: Mar 23, 1971   Interim History: Cynthia Underwood is a 47 y.o. right-handed African American female with myotonic dystrophy type I complicated by cardiac arryhtmia s/p PPM (June 2018), and hyperlipidemia returning to the clinic with new complaints of joint stiffness and pain.  She is followed for myotonic dystrophy type I.  The patient was accompanied to the clinic by self.  History of present illness: Starting around the age of 33, she started having lock jaw and stiffness of the hands. Over the years, she developed worsening stiffness of her fingers and hands. Because her maternal aunt, who is also a patient of mine, had known myotonic dystrophy, she ultimately was genetically tested at the age of 12 which confirmed the diagnosis of myotonic dystrophy type 1. She has a strong family history of DM1 including maternal aunt, cousins x 2, and younger sister. She does not have any children and has no future plans for pregnancy.  Symptoms were relatively stable until her mid-30s and then started developing fatigue, weakness, daytime sleepiness, and shortness of breath with exertion. She walks independently but was told previously told to use leg braces.  Over the past few years, she noticed intermittent difficulty swallowing liquids. She underwent barium swallow which reportedly showed signs of aspiration and she was recommended to use a straw and use chin tuck position.   She was seeing several neurologists over the years at [facility] in [place]. She is not working and has been on disability since 2010.  She established care with me in February 2016.  She had significant leg weakness where she had to lift her left leg out of the car because it is so weak.  She feels that she no longer has any good days, they are either "bad or worse".  In fall of 2016, she had to move her bedroom to the [place] because  of difficulty climbing stairs.  Since completing home PT/OT, her muscle strength has improved and sometimes, she is able to walk without AFO.  By 2017, she was able to climb stairs and went back to sleeping upstairs. She was started in Cymbalta 10m for mood, fatigue and myalgias which significantly helped, but self-discontinued this in 2018 due to feeling it was ineffective.    She followed by cardiology for abnormal EKG with RBBB and LAFB as well as myocardial thinning.  She is also undergoing pulmonology work-up for dyspnea, which is  suggestive of restrictive changes, consistent with muscular weakness due to DM1.    UPDATE 07/16/2017:  She is here for 6 month appointment.  She is constantly feeling tired and has no appetite. She has depressed mood and is not interested in seeing a counselor.  She has been treated for this in the past, but feels that people think she is depressed because of her disease, where she really is not bothered by the fact that she has DM1 and has come to terms with this.  She does not specify exactly what is making her feel depressed.  Of note, during the summer, she had left cataract surgery and had dual chamber PPM implanted by Dr. KCaryl Comes  UPDATE 12/14/2017:  She scheduled sooner appointment because of new complaints of 2-week history of increased stiffness and pain of the fingers, such as when making a first. She also has right shoulder pain.  She has not noticed swelling.  She denies any new cramps or weakness.  Her appetite and mood is doing much better on remeron 7.37m and she has gained 30lb.  No new problems with swallowing or talking.   Medications:  Current Outpatient Medications on File Prior to Visit  Medication Sig Dispense Refill  . ibuprofen (ADVIL,MOTRIN) 800 MG tablet Take 800 mg by mouth every 6 (six) hours as needed. for pain  0  . mirtazapine (REMERON) 7.5 MG tablet Take 1 tablet (7.5 mg total) at bedtime by mouth. 30 tablet 5   No current  facility-administered medications on file prior to visit.     Allergies:  Allergies  Allergen Reactions  . Penicillins Nausea And Vomiting, Rash and Other (See Comments)    Fever Has patient had a PCN reaction causing immediate rash, facial/tongue/throat swelling, SOB or lightheadedness with hypotension: Yes Has patient had a PCN reaction causing severe rash involving mucus membranes or skin necrosis: Unknown Has patient had a PCN reaction that required hospitalization: No Has patient had a PCN reaction occurring within the last 10 years: No If all of the above answers are "NO", then may proceed with Cephalosporin use.     Review of Systems:  CONSTITUTIONAL: No fevers, chills, night sweats, or weight loss.  EYES: No visual changes or eye pain ENT: No hearing changes.  No history of nose bleeds.   RESPIRATORY: No cough, wheezing and shortness of breath.   CARDIOVASCULAR: Negative for chest pain, and palpitations.   GI: Negative for abdominal discomfort, blood in stools or black stools.  No recent change in bowel habits.   GU:  No history of incontinence.   MUSCLOSKELETAL: No history of joint pain or swelling.  No myalgias.   SKIN: Negative for lesions, rash, and itching.   ENDOCRINE: Negative for cold or heat intolerance, polydipsia or goiter.   PSYCH:  +depression or anxiety symptoms.   NEURO: As Above.   Vital Signs:  BP 100/70   Pulse 83   Ht 5' 3"  (1.6 m)   Wt 134 lb (60.8 kg)   SpO2 99%   BMI 23.74 kg/m   General Medical Exam:   General:  myotonic facies with temporal wasting, elongated facies Eyes/ENT: see cranial nerve examination.   Neck: No masses appreciated.  Full range of motion without tenderness.  No carotid bruits. Respiratory:  Clear to auscultation, good air entry bilaterally.   Cardiac:  Regular rate and rhythm, no murmur.   Ext:  No edema, there is tenderness over the PIP and DIP in the hands  Neurological Exam: MENTAL STATUS including orientation to  time, place, person, recent and remote memory, attention span and concentration, language, and fund of knowledge is normal.  Speech is mildly dysarthric and hypophonic.    CRANIAL NERVES: Pupils equal round and reactive to light.. Normal conjugate, extra-ocular eye movements in all directions of gaze. Mild bilateral ptosis. Typical mytonic facies with temporal wasting and transverse smile. Mild facial weakness as noted frontalis and oribicularis oculi and oris.  MOTOR:  Generalized loss of muscle bulk. Mild grip myotonia bilaterally. Tone is normal.   Right Upper Extremity:    Left Upper Extremity:    Deltoid  5-/5   Deltoid  5-/5   Biceps  5-/5   Biceps  5-/5   Triceps  5-/5   Triceps  5-/5   Wrist extensors  5/5   Wrist extensors  5/5   Wrist flexors  5/5   Wrist flexors  5/5   Finger extensors  4/5   Finger extensors  4/5  Finger flexors  4/5   Finger flexors  4/5   Dorsal interossei  4/5   Dorsal interossei  4/5   Abductor pollicis  4/5   Abductor pollicis  4/5   Tone (Ashworth scale)  0  Tone (Ashworth scale)  0   Right Lower Extremity:    Left Lower Extremity:    Hip flexors  4/5   Hip flexors  4/5   Hip extensors  4/5   Hip extensors  4/5   Knee flexors  5/5   Knee flexors  5/5   Knee extensors  5/5   Knee extensors  5/5   Dorsiflexors  4/5   Dorsiflexors  4/5   Plantarflexors  5-/5   Plantarflexors  5-/5   Toe extensors  5-/5   Toe extensors  5-/5   Toe flexors  5-/5   Toe flexors  5-/5   Tone (Ashworth scale)  0  Tone (Ashworth scale)  0    COORDINATION/GAIT:   Gait is normal.    Data:  Lab Results  Component Value Date   HGBA1C 5.7 01/09/2017   Lab Results  Component Value Date   TSH 0.30 (L) 01/09/2017   Lab Results  Component Value Date   VITAMINB12 400 01/09/2017    IMPRESSION/PLAN: 1.  Polyarthalgia affecting both hands and right shoulder  - Start naprosyn 538m twice daily as needed for pain  - Check ESR, CRP, RF, ANA  2.  Depression - mood is  improved on remeron 7.537m but will reduce it to half-tablet (3.7526mat bedtime due to weight gain.  She has gained > 30lb over 6 months.  She does not wish to try alternative medication for mood as it is also helping her sleep.    3.  Myotonic dystrophy type I, genetically confirmed and with strong family history.  This has been complicated by cataracts, cardiac arrhythmia s/p PPM, dypnea, and dysphagia.   She has completed PT over the years, with variable benefit. She is compliant with AFOs.  Return to clinic in 1 month   Thank you for allowing me to participate in patient's care.  If I can answer any additional questions, I would be pleased to do so.    Sincerely,    Donika K. PatPosey ProntoO

## 2017-12-14 NOTE — Patient Instructions (Addendum)
Reduce remeron to half tablet daily  Check labs  Start naprosyn 500mg  twice daily as needed for your pain

## 2017-12-18 LAB — ANA: Anti Nuclear Antibody(ANA): NEGATIVE

## 2017-12-18 LAB — RHEUMATOID FACTOR

## 2017-12-20 ENCOUNTER — Telehealth: Payer: Self-pay | Admitting: *Deleted

## 2017-12-20 ENCOUNTER — Other Ambulatory Visit: Payer: Self-pay | Admitting: *Deleted

## 2017-12-20 DIAGNOSIS — R899 Unspecified abnormal finding in specimens from other organs, systems and tissues: Secondary | ICD-10-CM

## 2017-12-20 NOTE — Telephone Encounter (Signed)
Patient given lab results and will come in next week for more lab work.

## 2017-12-20 NOTE — Telephone Encounter (Signed)
-----  Message from Alda Berthold, DO sent at 12/20/2017 10:21 AM EDT ----- Please inform patient that one of her inflammatory markers was elevated and I would like to recheck ESR. The rest of her labs were normal.  If this remains high, I will request that she f/u with her PCP to see if she has an inflammatory condition causes her joint pain.

## 2017-12-28 ENCOUNTER — Other Ambulatory Visit (INDEPENDENT_AMBULATORY_CARE_PROVIDER_SITE_OTHER): Payer: Medicare Other

## 2017-12-28 DIAGNOSIS — R6889 Other general symptoms and signs: Secondary | ICD-10-CM

## 2017-12-28 DIAGNOSIS — R899 Unspecified abnormal finding in specimens from other organs, systems and tissues: Secondary | ICD-10-CM

## 2017-12-28 LAB — SEDIMENTATION RATE: SED RATE: 130 mm/h — AB (ref 0–20)

## 2018-01-02 ENCOUNTER — Telehealth: Payer: Self-pay | Admitting: *Deleted

## 2018-01-02 ENCOUNTER — Encounter: Payer: Self-pay | Admitting: Family Medicine

## 2018-01-02 ENCOUNTER — Ambulatory Visit: Payer: Medicare Other | Attending: Family Medicine | Admitting: Family Medicine

## 2018-01-02 VITALS — BP 112/75 | HR 60 | Temp 97.3°F | Ht 63.0 in | Wt 136.4 lb

## 2018-01-02 DIAGNOSIS — G7111 Myotonic muscular dystrophy: Secondary | ICD-10-CM | POA: Insufficient documentation

## 2018-01-02 DIAGNOSIS — Z0000 Encounter for general adult medical examination without abnormal findings: Secondary | ICD-10-CM

## 2018-01-02 DIAGNOSIS — F329 Major depressive disorder, single episode, unspecified: Secondary | ICD-10-CM | POA: Diagnosis not present

## 2018-01-02 DIAGNOSIS — Z950 Presence of cardiac pacemaker: Secondary | ICD-10-CM | POA: Diagnosis not present

## 2018-01-02 DIAGNOSIS — E782 Mixed hyperlipidemia: Secondary | ICD-10-CM

## 2018-01-02 DIAGNOSIS — Z23 Encounter for immunization: Secondary | ICD-10-CM

## 2018-01-02 DIAGNOSIS — M2550 Pain in unspecified joint: Secondary | ICD-10-CM | POA: Diagnosis not present

## 2018-01-02 DIAGNOSIS — Z79899 Other long term (current) drug therapy: Secondary | ICD-10-CM | POA: Insufficient documentation

## 2018-01-02 DIAGNOSIS — Z0001 Encounter for general adult medical examination with abnormal findings: Secondary | ICD-10-CM | POA: Diagnosis not present

## 2018-01-02 DIAGNOSIS — D573 Sickle-cell trait: Secondary | ICD-10-CM | POA: Diagnosis not present

## 2018-01-02 DIAGNOSIS — Z9889 Other specified postprocedural states: Secondary | ICD-10-CM | POA: Diagnosis not present

## 2018-01-02 MED ORDER — TETANUS-DIPHTH-ACELL PERTUSSIS 5-2.5-18.5 LF-MCG/0.5 IM SUSP: 0.5000 mL | Freq: Once | INTRAMUSCULAR | 0 refills | Status: AC

## 2018-01-02 MED FILL — BOOSTRIX VACCINE SYRINGE: 5-2.5-18.5 | 1 days supply | Qty: 1 | Fill #0

## 2018-01-02 NOTE — Telephone Encounter (Signed)
-----   Message from Alda Berthold, DO sent at 01/01/2018  1:05 PM EDT ----- Please inform patient to f/u with her PCP has her inflammatory marker is still quite high and may need more work-up.  Thanks.

## 2018-01-02 NOTE — Progress Notes (Signed)
Subjective:  Patient ID: Cynthia Underwood, female    DOB: March 08, 1971  Age: 47 y.o. MRN: [id number]  CC: Annual Exam   HPI Cynthia Underwood is a 47 year old female with a history of myotonic dystrophy complicated by cardiac conduction abnormalities including 2:1 second-degree heart s/p pacemaker placement who presents today for an annual physical exam. Last PAP smear was in 12/2016 and was normal She has an upcoming appointment for a mammogram on 02/15/18.  She was recently seen by Neurology, Dr Posey Pronto and complained of polyarthralgias, found to have elevated sed rate of 126 which trended up to 130 on repeat. She states her arthralgias have improved on Naproxen  Past Medical History:  Diagnosis Date  . Anemia   . Bifascicular block 10/02/2014  . Depression   . Excess ear wax   . 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 Presence of permanent cardiac pacemaker   . 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 Sickle cell trait ([place])   . Watery eyes    Left  . Weakness of both legs     Past Surgical History:  Procedure Laterality Date  . BREAST BIOPSY Right   . EYE SURGERY Left 2017   "related to blockage in my nose"  . INSERT / REPLACE / REMOVE PACEMAKER  02/15/2017  . PACEMAKER IMPLANT N/A 02/15/2017   Procedure: Pacemaker Implant;  Surgeon: Deboraha Sprang, MD;  Location: [facility];  Service: Cardiovascular;  Laterality: N/A;     Outpatient Medications Prior to Visit  Medication Sig Dispense Refill  . ibuprofen (ADVIL,MOTRIN) 400 MG tablet TAKE 1 TABLET EVERY 4 HOURS  0  . ibuprofen (ADVIL,MOTRIN) 800 MG tablet Take 800 mg by mouth every 6 (six) hours as needed. for pain  0  . mirtazapine (REMERON) 7.5 MG tablet Take 1 tablet (7.5 mg total) at bedtime by mouth. 30 tablet 5  . naproxen (NAPROSYN) 500 MG tablet Take 1 tablet (500 mg total) by mouth every 12 (twelve) hours as needed for moderate  pain. 60 tablet 0   No facility-administered medications prior to visit.     ROS Review of Systems  Constitutional: Negative for activity change, appetite change and fatigue.  HENT: Negative for congestion, sinus pressure and sore throat.   Eyes: Negative for visual disturbance.  Respiratory: Negative for cough, chest tightness, shortness of breath and wheezing.   Cardiovascular: Negative for chest pain and palpitations.  Gastrointestinal: Negative for abdominal distention, abdominal pain and constipation.  Endocrine: Negative for polydipsia.  Genitourinary: Negative for dysuria and frequency.  Musculoskeletal: Negative for arthralgias and back pain.  Skin: Negative for rash.  Neurological: Negative for tremors, light-headedness and numbness.  Hematological: Does not bruise/bleed easily.  Psychiatric/Behavioral: Negative for agitation and behavioral problems.    Objective:  BP 112/75   Pulse 60   Temp (!) 97.3 F (36.3 C) (Oral)   Ht 5' 3"  (1.6 m)   Wt 136 lb 6.4 oz (61.9 kg)   SpO2 100%   BMI 24.16 kg/m   BP/Weight 01/02/2018 12/14/2017 3/35/4562  Systolic BP 563 893 86  Diastolic BP 75 70 61  Wt. (Lbs) 136.4 134 115.8  BMI 24.16 23.74 20.51      Physical Exam  Constitutional: She is oriented to person, place, and time. She appears well-developed and well-nourished. No distress.  HENT:  Head: Normocephalic.  Right Ear: External ear normal.  Left Ear: External  ear normal.  Nose: Nose normal.  Mouth/Throat: Oropharynx is clear and moist.  Eyes: Pupils are equal, round, and reactive to light. Conjunctivae and EOM are normal.  Neck: Normal range of motion. No JVD present.  Cardiovascular: Normal rate, regular rhythm, normal heart sounds and intact distal pulses. Exam reveals no gallop.  No murmur heard. Pulmonary/Chest: Effort normal and breath sounds normal. No respiratory distress. She has no wheezes. She has no rales. She exhibits no tenderness. Right breast  exhibits no mass and no tenderness. Left breast exhibits no mass and no tenderness.  Abdominal: Soft. Bowel sounds are normal. She exhibits no distension and no mass. There is no tenderness.  Musculoskeletal: Normal range of motion. She exhibits no edema or tenderness.  Neurological: She is alert and oriented to person, place, and time. She has normal reflexes.  Skin: Skin is warm and dry. She is not diaphoretic.  Psychiatric: She has a normal mood and affect.     Assessment & Plan:   1. Annual physical exam Counseled on 150 minutes of exercise per week, healthy eating (including decreased daily intake of saturated fats, cholesterol, added sugars, sodium), STI prevention, routine healthcare maintenance. Keep up coming appointment for mammogram Next PAP smear is due in 12/2019 as per USPSTF guidelines  2. Myotonic dystrophy, type 1 ([place]) Complicated by cardiac conduction abnormalities s/p pacemaker placement. Closely followed by Neurology Referred to Rheumatology due to sed rate trending up - Ambulatory referral to Rheumatology  3. Mixed hyperlipidemia Diet controlled - CMP14+EGFR; Future - Lipid panel; Future  4. Polyarthralgia Improved on Naproxen - Ambulatory referral to Rheumatology  5. Need for Tdap vaccination - Tdap ([place]) 5-2.5-18.5 LF-MCG/0.5 injection; Inject 0.5 mLs into the muscle once for 1 dose.  Dispense: 0.5 mL; Refill: 0   Meds ordered this encounter  Medications  . Tdap (BOOSTRIX) 5-2.5-18.5 LF-MCG/0.5 injection    Sig: Inject 0.5 mLs into the muscle once for 1 dose.    Dispense:  0.5 mL    Refill:  0    Follow-up: Return in about 1 year (around 01/03/2019) for Annual physical.   Charlott Rakes MD

## 2018-01-02 NOTE — Patient Instructions (Signed)

## 2018-01-02 NOTE — Telephone Encounter (Signed)
Left message giving patient results and instructions.   

## 2018-01-03 ENCOUNTER — Encounter: Payer: Self-pay | Admitting: Family Medicine

## 2018-01-10 ENCOUNTER — Ambulatory Visit: Payer: Medicare Other | Attending: Family Medicine

## 2018-01-10 DIAGNOSIS — E782 Mixed hyperlipidemia: Secondary | ICD-10-CM | POA: Insufficient documentation

## 2018-01-10 NOTE — Progress Notes (Signed)
Patient here for lab visit  

## 2018-01-11 ENCOUNTER — Other Ambulatory Visit: Payer: Self-pay | Admitting: Family Medicine

## 2018-01-11 ENCOUNTER — Telehealth: Payer: Self-pay | Admitting: Neurology

## 2018-01-11 LAB — CMP14+EGFR
A/G RATIO: 1 — AB (ref 1.2–2.2)
ALT: 14 IU/L (ref 0–32)
AST: 24 IU/L (ref 0–40)
Albumin: 3.9 g/dL (ref 3.5–5.5)
Alkaline Phosphatase: 78 IU/L (ref 39–117)
BUN/Creatinine Ratio: 18 (ref 9–23)
BUN: 12 mg/dL (ref 6–24)
Bilirubin Total: 0.3 mg/dL (ref 0.0–1.2)
CALCIUM: 9.9 mg/dL (ref 8.7–10.2)
CO2: 27 mmol/L (ref 20–29)
Chloride: 103 mmol/L (ref 96–106)
Creatinine, Ser: 0.68 mg/dL (ref 0.57–1.00)
GFR, EST AFRICAN AMERICAN: 120 mL/min/{1.73_m2} (ref >59)
GFR, EST NON AFRICAN AMERICAN: 104 mL/min/{1.73_m2} (ref >59)
GLOBULIN, TOTAL: 3.8 g/dL (ref 1.5–4.5)
Glucose: 88 mg/dL (ref 65–99)
POTASSIUM: 4.4 mmol/L (ref 3.5–5.2)
SODIUM: 145 mmol/L — AB (ref 134–144)
TOTAL PROTEIN: 7.7 g/dL (ref 6.0–8.5)

## 2018-01-11 LAB — LIPID PANEL
CHOLESTEROL TOTAL: 214 mg/dL — AB (ref 100–199)
Chol/HDL Ratio: 3.8 ratio (ref 0.0–4.4)
HDL: 57 mg/dL (ref >39)
LDL CALC: 134 mg/dL — AB (ref 0–99)
TRIGLYCERIDES: 113 mg/dL (ref 0–149)
VLDL Cholesterol Cal: 23 mg/dL (ref 5–40)

## 2018-01-11 MED ORDER — ATORVASTATIN CALCIUM 20 MG PO TABS: 20.0000 mg | ORAL_TABLET | Freq: Every day | ORAL | 3 refills | Status: DC

## 2018-01-11 NOTE — Telephone Encounter (Signed)
Patient called and has a question regarding something to do with her Primary Doctor? I had a hard time understanding the request. Please Call. Thanks

## 2018-01-15 ENCOUNTER — Telehealth: Payer: Self-pay

## 2018-01-15 DIAGNOSIS — R700 Elevated erythrocyte sedimentation rate: Secondary | ICD-10-CM

## 2018-01-15 NOTE — Telephone Encounter (Signed)
Patient was called and informed of lab results. Patient asked about referral to Hematology

## 2018-01-15 NOTE — Telephone Encounter (Signed)
Patient had already talked to her PCP.

## 2018-01-16 ENCOUNTER — Encounter

## 2018-01-16 ENCOUNTER — Ambulatory Visit: Payer: Medicare Other | Admitting: Neurology

## 2018-01-17 NOTE — Telephone Encounter (Signed)
Hematology referral has been placed.

## 2018-01-17 NOTE — Addendum Note (Signed)
Addended byCharlott Rakes on: 01/17/2018 02:03 PM   Modules accepted: Orders

## 2018-01-18 ENCOUNTER — Telehealth: Payer: Self-pay | Admitting: *Deleted

## 2018-01-18 ENCOUNTER — Telehealth: Payer: Self-pay | Admitting: Hematology

## 2018-01-18 ENCOUNTER — Encounter: Payer: Self-pay | Admitting: Hematology

## 2018-01-18 NOTE — Telephone Encounter (Signed)
New referral from Dr. Margarita Rana for a dx of elevated erythrocyte.  Pt has been scheduled to see Dr. Irene Limbo on 6/13 at 10am. Pt aware to arrive 30 minutes early. Letter mailed.

## 2018-01-21 ENCOUNTER — Ambulatory Visit
Admission: RE | Admit: 2018-01-21 | Discharge: 2018-01-21 | Disposition: A | Discharge status: Home / Self Care | Payer: Medicare Other | Source: Ambulatory Visit | Attending: Nurse Practitioner | Admitting: Nurse Practitioner

## 2018-01-21 DIAGNOSIS — Z1231 Encounter for screening mammogram for malignant neoplasm of breast: Secondary | ICD-10-CM

## 2018-02-10 NOTE — Progress Notes (Signed)
Electrophysiology Office Note Date: 02/11/2018  ID:  Cynthia Underwood, Cynthia Underwood 1971-07-07, MRN [id number]  PCP: Charlott Rakes, MD Primary Cardiologist: Radford Pax Electrophysiologist: Caryl Comes  CC: Pacemaker follow-up  Cynthia Underwood is a 47 y.o. female seen today for Dr Caryl Comes.  She presents today for routine electrophysiology followup.  Since last being seen in our clinic, the patient reports doing very well.  She denies chest pain, palpitations, dyspnea, PND, orthopnea, nausea, vomiting, dizziness, syncope, edema, weight gain, or early satiety.  Device History: MDT dual chamber PPM (His Bundle) implanted 2018 for high grade heart block   Past Medical History:  Diagnosis Date  . Anemia   . Bifascicular block 10/02/2014  . Depression   . Excess ear wax   . 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 Presence of permanent cardiac pacemaker   . 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 Sickle cell trait ([place])   . Watery eyes    Left  . Weakness of both legs    Past Surgical History:  Procedure Laterality Date  . BREAST BIOPSY Right   . EYE SURGERY Left 2017   "related to blockage in my nose"  . INSERT / REPLACE / REMOVE PACEMAKER  02/15/2017  . PACEMAKER IMPLANT N/A 02/15/2017   Procedure: Pacemaker Implant;  Surgeon: Deboraha Sprang, MD;  Location: [facility];  Service: Cardiovascular;  Laterality: N/A;    Current Outpatient Medications  Medication Sig Dispense Refill  . atorvastatin (LIPITOR) 20 MG tablet Take 1 tablet (20 mg total) by mouth daily. 30 tablet 3  . mirtazapine (REMERON) 7.5 MG tablet Take 1 tablet (7.5 mg total) at bedtime by mouth. 30 tablet 5  . naproxen (NAPROSYN) 500 MG tablet Take 1 tablet (500 mg total) by mouth every 12 (twelve) hours as needed for moderate pain. 60 tablet 0   No current facility-administered medications for this visit.     Allergies:   Penicillins    Social History: Social History   Socioeconomic History  . Marital status: Single    Spouse name: Not on file  . Number of children: 0  . Years of education: 1.5 Collge  . Highest education level: Not on file  Occupational History  . Occupation: Unemployed    Employer: OTHER  Social Needs  . Financial resource strain: Not on file  . Food insecurity:    Worry: Not on file    Inability: Not on file  . Transportation needs:    Medical: Not on file    Non-medical: Not on file  Tobacco Use  . Smoking status: Former Smoker    Packs/day: 0.00    Years: 28.00    Pack years: 0.00    Types: Cigarettes    Last attempt to quit: 10/04/2016    Years since quitting: 1.3  . Smokeless tobacco: Never Used  Substance and Sexual Activity  . Alcohol use: Yes    Alcohol/week: 0.0 oz    Comment: 02/15/2017 "maybe 6 times/year"  . Drug use: Yes    Frequency: 2.0 times per week    Types: Marijuana    Comment: 02/15/2017 "couple times/month"  . Sexual activity: Yes    Birth control/protection: None, Condom  Lifestyle  . Physical activity:    Days per week: Not on file    Minutes per session: Not on file  . Stress: Not on file  Relationships  . Social connections:  Talks on phone: Not on file    Gets together: Not on file    Attends religious service: Not on file    Active member of club or organization: Not on file    Attends meetings of clubs or organizations: Not on file    Relationship status: Not on file  . Intimate partner violence:    Fear of current or ex partner: Not on file    Emotionally abused: Not on file    Physically abused: Not on file    Forced sexual activity: Not on file  Other Topics Concern  . Not on file  Social History Narrative   Patient lives at home with her aunt.   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  She moved to [facility] in August 2015.   She has been on disability since 2010.   Education: 1 1/2 years of college.    Family  History: Family History  Problem Relation Age of Onset  . Cancer Father 46       Deceased  . Heart disease Father   . COPD Mother   . Diabetes Maternal Uncle   . Heart disease Maternal Uncle   . Heart disease Paternal Grandmother   . Diabetes Paternal Grandmother   . Hypertension Paternal Grandmother   . Neuromuscular disorder Maternal Aunt        Myotonic dystrophy  . Neuromuscular disorder Cousin        Myotonic dystrophy  . Neuromuscular disorder Sister        Myotonic dystrophy     Review of Systems: All other systems reviewed and are otherwise negative except as noted above.   Physical Exam: VS:  BP 102/60   Pulse 70   Ht 5\' 3"  (1.6 m)   Wt 136 lb (61.7 kg)   SpO2 95%   BMI 24.09 kg/m  , BMI Body mass index is 24.09 kg/m.  GEN- The patient is well appearing, alert and oriented x 3 today.   HEENT: normocephalic, atraumatic; sclera clear, conjunctiva pink; hearing intact; oropharynx clear; neck supple  Lungs- Clear to ausculation bilaterally, normal work of breathing.  No wheezes, rales, rhonchi Heart- Regular rate and rhythm  GI- soft, non-tender, non-distended, bowel sounds present  Extremities- no clubbing, cyanosis, or edema  MS- no significant deformity or atrophy Skin- warm and dry, no rash or lesion; PPM pocket well healed Psych- euthymic mood, full affect Neuro- strength and sensation are intact  PPM Interrogation- reviewed in detail today,  See PACEART report  EKG:  EKG is not ordered today.  Recent Labs: 01/10/2018: ALT 14; BUN 12; Creatinine, Ser 0.68; Potassium 4.4; Sodium 145   Wt Readings from Last 3 Encounters:  02/11/18 136 lb (61.7 kg)  01/02/18 136 lb 6.4 oz (61.9 kg)  12/14/17 134 lb (60.8 kg)     Other studies Reviewed: Additional studies/ records that were reviewed today include: Dr Caryl Comes and Dr Theodosia Blender office notes   Assessment and Plan:  1.  High grade heart block Normal PPM function See Pace Art report No changes  today Device programmed MVP with septal HIS capture    Current medicines are reviewed at length with the patient today.   The patient does not have concerns regarding her medicines.  The following changes were made today:  none  Labs/ tests ordered today include: none Orders Placed This Encounter  Procedures  . CUP PACEART INCLINIC DEVICE CHECK     Disposition:   Follow up  with [facility], Dr Caryl Comes 1 year     Signed, Chanetta Marshall, NP 02/11/2018 10:35 AM  [facility] [address] [place] [address] [phone] (office) [phone] (fax)

## 2018-02-11 ENCOUNTER — Ambulatory Visit (INDEPENDENT_AMBULATORY_CARE_PROVIDER_SITE_OTHER): Payer: Medicare Other | Admitting: Nurse Practitioner

## 2018-02-11 ENCOUNTER — Encounter: Payer: Self-pay | Admitting: Nurse Practitioner

## 2018-02-11 VITALS — BP 102/60 | HR 70 | Ht 63.0 in | Wt 136.0 lb

## 2018-02-11 DIAGNOSIS — I441 Atrioventricular block, second degree: Secondary | ICD-10-CM | POA: Diagnosis not present

## 2018-02-11 LAB — CUP PACEART INCLINIC DEVICE CHECK
Date Time Interrogation Session: 20190610101744
Implantable Lead Model: 3830
Implantable Lead Model: 5076
MDC IDC LEAD IMPLANT DT: 20180614
MDC IDC LEAD IMPLANT DT: 20180614
MDC IDC LEAD LOCATION: 753859
MDC IDC LEAD LOCATION: 753859
MDC IDC PG IMPLANT DT: 20180614

## 2018-02-11 NOTE — Patient Instructions (Addendum)
Medication Instructions:   Your physician recommends that you continue on your current medications as directed. Please refer to the Current Medication list given to you today.   If you need a refill on your cardiac medications before your next appointment, please call your pharmacy.  Labwork: NONE ORDERED  TODAY    Testing/Procedures: NONE ORDERED  TODAY    Follow-Up: Your physician wants you to follow-up in: [place] will receive a reminder letter in the mail two months in advance. If you don't receive a letter, please call our office to schedule the follow-up appointment.   Remote monitoring is used to monitor your Pacemaker of ICD from home. This monitoring reduces the number of office visits required to check your device to one time per year. It allows [place] to keep an eye on the functioning of your device to ensure it is working properly. You are scheduled for a device check from home on . 02-18-18+ You may send your transmission at any time that day. If you have a wireless device, the transmission will be sent automatically. After your physician reviews your transmission, you will receive a postcard with your next transmission date.     Any Other Special Instructions Will Be Listed Below (If Applicable).

## 2018-02-13 NOTE — Progress Notes (Signed)
HEMATOLOGY/ONCOLOGY CONSULTATION NOTE  Date of Service: 02/14/2018  Patient Care Team: Charlott Rakes, MD as PCP - General (Family Medicine)  CHIEF COMPLAINTS/PURPOSE OF CONSULTATION:  Elevated Sed Rate  HISTORY OF PRESENTING ILLNESS:   Cynthia Underwood is a wonderful 47 y.o. female who has been referred to [place] by Dr Charlott Rakes for evaluation and management of Elevated Sed Rate. The pt reports that she is doing well overall.   The pt reports that she has been unable to make her hands into fists, which began 2 months ago in both of her hands involving all of the joints of her hands. She notes that her stiffness was painful and adds that it was worse in the morning and resolved a little as the day progressed. She also notes that her hands were more stiff in the cold. She denies other specific, red, swollen joints.   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 She attended PT but describes it as being unhelpful. She is seeing Dr Narda Amber in neurology. She notes that she has noticed a change in her voice, noting the pitch has become higher and her voice weaker.  She also notes that her abdomen is "very uncomfortable" and has some lower abdominal pains. She notes that she does not take laxatives and has a bowel movement every two days on average. She denies any change in breathing. She has gained 25 pounds since she began Remeron, and notes that when she eats she feels some abdominal discomfort.   She has had two surgeries on her left eye this year for cataracts and is awaiting further evaluation of her right eye.   She has had drenching night sweats and has not been evaluated for this. This began before January when she quit smoking. She also reports that she received a pacemaker within the last year and her fatigue began to improve, but has returned recently. She notes that she doesn't sleep at night as well. She notes that she  began Remeron 7.5mg  with her neurologist and that this is no longer helping her to sleep well. She takes Remeron for her depression and sleep aide. She adds that she has had historic difficulty falling asleep at night.   She notes that her arms and legs have been very weak and has used braces for her legs at times but notes that it is difficult to always use them.  She also notes that she has some difficulty breathing and has a pacemaker related to an abnormal rhythm.    She lives with family.  She is up to date with her mammograms and pap smears.   Most recent lab results (12/28/17) of Sed Rate were elevated at 130.  CRP 12/14/17 was WNL at 0.6.  Beta HCG 02/14/17 was elevated at 7 T3 and T4 from 01/09/17 were WNL  On review of systems, pt reports hand pain and stiffness, night sweats, feeling tired, voice change, moving her bowels, abdominal pain to palpation, weight gain, and denies skin rashes, mouth sores, recent infections, fevers, chills, new/different headaches, skin rashes, urinary discomfort, changes in bowel habits, dry mouth, problems swallowing, leg swelling, shoulder pain, knee pain, hip pain, and any other symptoms.   On PMHx the pt reports myotonic muscular dystrophy, sickle cell trait, depression, left eye surgery in 10/2017 and 12/2017, pacemaker, denies thyroid problems. On Social Hx the pt reports that she quit smoking cigarettes in January 2019, having smoked  a half pack each day prior to cessation. She began smoking when she was 49 year olds On Family Hx the pt reports paternal-side sickle cell, paternal-aunt with muscular dystrophy. Father had stage IV lung cancer and smoked. Brain cancer in paternal-side uncle. Paternal-grandmother had Sarcoidosis. Maternal cousin with lupus.    MEDICAL HISTORY:  Past Medical History:  Diagnosis Date  . Anemia   . Bifascicular block 10/02/2014  . Depression   . Excess ear wax   . Myotonic dystrophy, type 1 ([place]) dx'd ~ 2005   Genetically  confirmed  . Presence of permanent cardiac pacemaker   . 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 Sickle cell trait ([place])   . Watery eyes    Left  . Weakness of both legs     SURGICAL HISTORY: Past Surgical History:  Procedure Laterality Date  . BREAST BIOPSY Right   . EYE SURGERY Left 2017   "related to blockage in my nose"  . INSERT / REPLACE / REMOVE PACEMAKER  02/15/2017  . PACEMAKER IMPLANT N/A 02/15/2017   Procedure: Pacemaker Implant;  Surgeon: Deboraha Sprang, MD;  Location: [facility];  Service: Cardiovascular;  Laterality: N/A;    SOCIAL HISTORY: Social History   Socioeconomic History  . Marital status: Single    Spouse name: Not on file  . Number of children: 0  . Years of education: 1.5 Collge  . Highest education level: Not on file  Occupational History  . Occupation: Unemployed    Employer: OTHER  Social Needs  . Financial resource strain: Not on file  . Food insecurity:    Worry: Not on file    Inability: Not on file  . Transportation needs:    Medical: Not on file    Non-medical: Not on file  Tobacco Use  . Smoking status: Former Smoker    Packs/day: 0.00    Years: 28.00    Pack years: 0.00    Types: Cigarettes    Last attempt to quit: 10/04/2016    Years since quitting: 1.3  . Smokeless tobacco: Never Used  Substance and Sexual Activity  . Alcohol use: Yes    Alcohol/week: 0.0 oz    Comment: 02/15/2017 "maybe 6 times/year"  . Drug use: Yes    Frequency: 2.0 times per week    Types: Marijuana    Comment: 02/15/2017 "couple times/month"  . Sexual activity: Yes    Birth control/protection: None, Condom  Lifestyle  . Physical activity:    Days per week: Not on file    Minutes per session: Not on file  . Stress: Not on file  Relationships  . Social connections:    Talks on phone: Not on file    Gets together: Not on file    Attends religious service: Not on file    Active member of club or  organization: Not on file    Attends meetings of clubs or organizations: Not on file    Relationship status: Not on file  . Intimate partner violence:    Fear of current or ex partner: Not on file    Emotionally abused: Not on file    Physically abused: Not on file    Forced sexual activity: Not on file  Other Topics Concern  . Not on file  Social History Narrative   Patient lives at home with her aunt.   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  She moved  to [facility] in August 2015.   She has been on disability since 2010.   Education: 1 1/2 years of college.    FAMILY HISTORY: Family History  Problem Relation Age of Onset  . Cancer Father 41       Deceased  . Heart disease Father   . COPD Mother   . Diabetes Maternal Uncle   . Heart disease Maternal Uncle   . Heart disease Paternal Grandmother   . Diabetes Paternal Grandmother   . Hypertension Paternal Grandmother   . Neuromuscular disorder Maternal Aunt        Myotonic dystrophy  . Neuromuscular disorder Cousin        Myotonic dystrophy  . Neuromuscular disorder Sister        Myotonic dystrophy    ALLERGIES:  is allergic to penicillins.  MEDICATIONS:  Current Outpatient Medications  Medication Sig Dispense Refill  . atorvastatin (LIPITOR) 20 MG tablet Take 1 tablet (20 mg total) by mouth daily. 30 tablet 3  . mirtazapine (REMERON) 7.5 MG tablet Take 1 tablet (7.5 mg total) at bedtime by mouth. 30 tablet 5  . naproxen (NAPROSYN) 500 MG tablet Take 1 tablet (500 mg total) by mouth every 12 (twelve) hours as needed for moderate pain. 60 tablet 0   No current facility-administered medications for this visit.     REVIEW OF SYSTEMS:    10 Point review of Systems was done is negative except as noted above.  PHYSICAL EXAMINATION:  . Vitals:   02/14/18 1052  BP: 112/70  Pulse: 64  Resp: 18  Temp: 97.6 F (36.4 C)  SpO2: 100%   Filed Weights   02/14/18 1052  Weight: 136 lb (61.7 kg)    .Body mass index is 24.09 kg/m.  GENERAL:alert, in no acute distress and comfortable SKIN: no acute rashes, no significant lesions EYES: conjunctiva are pink and non-injected, sclera anicteric OROPHARYNX: MMM, no exudates, no oropharyngeal erythema or ulceration NECK: supple, palpably prominent thyroid LYMPH:  no palpable lymphadenopathy in the cervical, axillary or inguinal regions LUNGS: clear to auscultation b/l with normal respiratory effort HEART: regular rate & rhythm ABDOMEN:  abdominal distension with generalized tenderness and no peritoneal signs. No obvious palpable hepatosplenomegaly. Extremity: no pedal edema PSYCH: alert & oriented x 3 with fluent speech NEURO: no focal motor/sensory deficits  LABORATORY DATA:  I have reviewed the data as listed  . CBC Latest Ref Rng & Units 02/14/2018 02/04/2017 01/30/2017  WBC 3.9 - 10.3 K/uL 6.4 7.9 6.1  Hemoglobin 11.6 - 15.9 g/dL 11.7 12.7 13.3  Hematocrit 34.8 - 46.6 % 37.0 40.0 39.5  Platelets 145 - 400 K/uL 223 278 275   . CBC    Component Value Date/Time   WBC 6.4 02/14/2018 1225   RBC 3.95 02/14/2018 1225   HGB 11.7 02/14/2018 1225   HGB 13.3 01/30/2017 1641   HCT 37.0 02/14/2018 1225   HCT 39.5 01/30/2017 1641   PLT 223 02/14/2018 1225   PLT 275 01/30/2017 1641   MCV 93.7 02/14/2018 1225   MCV 91 01/30/2017 1641   MCH 29.6 02/14/2018 1225   MCHC 31.6 02/14/2018 1225   RDW 15.4 (H) 02/14/2018 1225   RDW 15.3 01/30/2017 1641   LYMPHSABS 3.6 (H) 02/14/2018 1225   LYMPHSABS 2.9 01/30/2017 1641   MONOABS 0.6 02/14/2018 1225   EOSABS 0.1 02/14/2018 1225   EOSABS 0.1 01/30/2017 1641   BASOSABS 0.0 02/14/2018 1225   BASOSABS 0.0 01/30/2017 1641     . CMP  Latest Ref Rng & Units 02/14/2018 01/10/2018 02/04/2017  Glucose 70 - 140 mg/dL 94 88 109(H)  BUN 7 - 26 mg/dL 18 12 13   Creatinine 0.60 - 1.10 mg/dL 0.74 0.68 0.58  Sodium 136 - 145 mmol/L 144 145(H) 142  Potassium 3.5 - 5.1 mmol/L 3.7 4.4 4.2  Chloride 98 -  109 mmol/L 105 103 107  CO2 22 - 29 mmol/L 28 27 24   Calcium 8.4 - 10.4 mg/dL 10.2 9.9 10.0  Total Protein 6.4 - 8.3 g/dL 8.7(H) 7.7 -  Total Bilirubin 0.2 - 1.2 mg/dL 0.3 0.3 -  Alkaline Phos 40 - 150 U/L 67 78 -  AST 5 - 34 U/L 26 24 -  ALT 0 - 55 U/L 15 14 -   Component     Latest Ref Rng & Units 02/14/2018  IgG (Immunoglobin G), Serum     700 - 1,600 mg/dL 1,367  IgA     87 - 352 mg/dL 420 (H)  IgM (Immunoglobulin M), Srm     26 - 217 mg/dL 157  Total Protein ELP     6.0 - 8.5 g/dL 7.6  Albumin SerPl Elph-Mcnc     2.9 - 4.4 g/dL 3.5  Alpha 1     0.0 - 0.4 g/dL 0.2  Alpha2 Glob SerPl Elph-Mcnc     0.4 - 1.0 g/dL 1.0  B-Globulin SerPl Elph-Mcnc     0.7 - 1.3 g/dL 1.5 (H)  Gamma Glob SerPl Elph-Mcnc     0.4 - 1.8 g/dL 1.4  M Protein SerPl Elph-Mcnc     Not Observed g/dL Not Observed  Globulin, Total     2.2 - 3.9 g/dL 4.1 (H)  Albumin/Glob SerPl     0.7 - 1.7 0.9  IFE 1      Comment  Please Note (HCV):      Comment  TSH     0.450 - 4.500 uIU/mL 1.860  Thyroxine (T4)     4.5 - 12.0 ug/dL 6.4  T3 Uptake Ratio     24 - 39 % 22 (L)  Free Thyroxine Index     1.2 - 4.9 1.4  RA Latex Turbid.     0.0 - 13.9 IU/mL <10.0  CCP Antibodies IgG/IgA     0 - 19 units 8  Speckled Pattern      1:80  NOTE:      Comment  ANA Ab, IFA      Positive (A)  Preg, Serum     NEGATIVE WEAK POSITIVE (A)  LDH     125 - 245 U/L 318 (H)      An apparent polyclonal gammopathy: IgA. Kappa and lambda            Typing appear increased.      RADIOGRAPHIC STUDIES: I have personally reviewed the radiological images as listed and agreed with the findings in the report. Ct Abdomen Pelvis W Contrast  Result Date: 02/20/2018 CLINICAL DATA:  Left upper quadrant pain, elevated sed rate. Abdominal distension, abnormal weight loss. EXAM: CT ABDOMEN AND PELVIS WITH CONTRAST TECHNIQUE: Multidetector CT imaging of the abdomen and pelvis was performed using the standard protocol following bolus  administration of intravenous contrast. CONTRAST:  122mL ISOVUE-300 IOPAMIDOL (ISOVUE-300) INJECTION 61% COMPARISON:  None. FINDINGS: Lower chest: Lung bases are clear. Heart size is at the upper limits of normal. No pericardial or pleural effusion. Hepatobiliary: Liver and gallbladder are unremarkable. No biliary ductal dilatation. Pancreas: Negative. Spleen: Negative. Adrenals/Urinary Tract: Adrenal glands are  unremarkable. Low-attenuation lesions in the kidneys measure up to 2.2 cm on the right and are likely cysts. Ureters are decompressed. Bladder is grossly unremarkable. Stomach/Bowel: Stomach, small bowel, appendix and colon are unremarkable. Vascular/Lymphatic: Vascular structures are unremarkable. No pathologically enlarged lymph nodes. Reproductive: Uterus is visualized. Prominent nabothian cyst. No adnexal mass. Other: No free fluid.  Mesenteries and peritoneum are unremarkable. Musculoskeletal: Right L5 pars defect without alignment abnormality. No worrisome lytic or sclerotic lesions. IMPRESSION: No findings to explain the patient's symptoms. Electronically Signed   By: Lorin Picket M.D.   On: 02/20/2018 11:47    ASSESSMENT & PLAN:   47 y.o. female with  1. Elevated Sed Rate Unclear etiology. Appears to be improving   ?reactive ?infection. PLAN -Discussed patient's most recent labs from 12/28/17, Sed rate elevated at 130 -- now down to 49 -No obvious symptoms of infection -Specific inflammation in her hand joints not found in physical examination, though pt endorsed stiffness - could be from muscular dystrophy. -Will repeat labs and thyroid functions -done and noted as above. Nl TFT's. -has no monoclonal paraproteinemia .  -+ve ANA with polyclonal gammopathy suggestive of an inflammatory process. -Physical examination yielded a finding of an uncomfortable abdomen which was painful to palpation as the only focal symptom and clinical finding and ordered CT Abdomen/ Pelvis- which is  unrevealing. -no overt LNadenopathy or hepato-splenomegaly to suggest lymphoproliferative process and blood counts are WNL -patient with Muscular dystrophy can have upto two-fold increase in the risk of all cancers combined-an excess accounted for by cancers of the colon, endometrium, ovary and brain-continue age appropriate cancer screening. -consider rheumatology referral in setting of joint pains +ANA and elevated inflammatory markers. -RA profile unrevealing.  2. Elevated LDH - unclear etiology. Could be on muscle origin  3. Weakly Positive HCG -recommend GYN examination by PCP/Referral to Gyn. -rpt HCG levels in a couple of weeks.  Labs today Ct abd/pelvis  RTC with Dr Irene Limbo as needed if any new questions/concerns Continue f/u with PCP   All of the patients questions were answered with apparent satisfaction. The patient knows to call the clinic with any problems, questions or concerns.  The total time spent in the appt was 45 minutes and more than 50% was on counseling and direct patient cares.    Sullivan Lone MD MS AAHIVMS [facility] Hematology/Oncology Physician [facility]  (Office):       [phone] (Work cell):  [phone] (Fax):           [phone]  02/14/2018 11:59 AM  I, Baldwin Jamaica, am acting as a Education administrator for Dr Irene Limbo.   .I have reviewed the above documentation for accuracy and completeness, and I agree with the above. Brunetta Genera MD

## 2018-02-14 ENCOUNTER — Inpatient Hospital Stay: Payer: Medicare Other | Attending: Hematology | Admitting: Hematology

## 2018-02-14 ENCOUNTER — Inpatient Hospital Stay: Payer: Medicare Other

## 2018-02-14 ENCOUNTER — Telehealth: Payer: Self-pay | Admitting: Hematology

## 2018-02-14 VITALS — BP 112/70 | HR 64 | Temp 97.6°F | Resp 18 | Ht 63.0 in | Wt 136.0 lb

## 2018-02-14 DIAGNOSIS — D573 Sickle-cell trait: Secondary | ICD-10-CM | POA: Insufficient documentation

## 2018-02-14 DIAGNOSIS — R7989 Other specified abnormal findings of blood chemistry: Secondary | ICD-10-CM

## 2018-02-14 DIAGNOSIS — G7111 Myotonic muscular dystrophy: Secondary | ICD-10-CM | POA: Diagnosis not present

## 2018-02-14 DIAGNOSIS — R700 Elevated erythrocyte sedimentation rate: Secondary | ICD-10-CM | POA: Diagnosis not present

## 2018-02-14 DIAGNOSIS — R634 Abnormal weight loss: Secondary | ICD-10-CM

## 2018-02-14 DIAGNOSIS — Z79899 Other long term (current) drug therapy: Secondary | ICD-10-CM | POA: Diagnosis not present

## 2018-02-14 DIAGNOSIS — Z87891 Personal history of nicotine dependence: Secondary | ICD-10-CM | POA: Diagnosis not present

## 2018-02-14 DIAGNOSIS — R740 Nonspecific elevation of levels of transaminase and lactic acid dehydrogenase [LDH]: Secondary | ICD-10-CM | POA: Diagnosis not present

## 2018-02-14 DIAGNOSIS — R1030 Lower abdominal pain, unspecified: Secondary | ICD-10-CM | POA: Diagnosis not present

## 2018-02-14 DIAGNOSIS — H269 Unspecified cataract: Secondary | ICD-10-CM | POA: Diagnosis not present

## 2018-02-14 DIAGNOSIS — Z950 Presence of cardiac pacemaker: Secondary | ICD-10-CM | POA: Insufficient documentation

## 2018-02-14 DIAGNOSIS — R1084 Generalized abdominal pain: Secondary | ICD-10-CM

## 2018-02-14 DIAGNOSIS — F329 Major depressive disorder, single episode, unspecified: Secondary | ICD-10-CM | POA: Diagnosis not present

## 2018-02-14 DIAGNOSIS — E349 Endocrine disorder, unspecified: Secondary | ICD-10-CM | POA: Diagnosis not present

## 2018-02-14 DIAGNOSIS — R140 Abdominal distension (gaseous): Secondary | ICD-10-CM

## 2018-02-14 DIAGNOSIS — R7402 Elevation of levels of lactic acid dehydrogenase (LDH): Secondary | ICD-10-CM

## 2018-02-14 LAB — CMP (CANCER CENTER ONLY)
ALT: 15 U/L (ref 0–55)
AST: 26 U/L (ref 5–34)
Albumin: 4 g/dL (ref 3.5–5.0)
Alkaline Phosphatase: 67 U/L (ref 40–150)
Anion gap: 11 (ref 3–11)
BUN: 18 mg/dL (ref 7–26)
CHLORIDE: 105 mmol/L (ref 98–109)
CO2: 28 mmol/L (ref 22–29)
Calcium: 10.2 mg/dL (ref 8.4–10.4)
Creatinine: 0.74 mg/dL (ref 0.60–1.10)
GFR, Est AFR Am: >60 mL/min (ref >60)
Glucose, Bld: 94 mg/dL (ref 70–140)
POTASSIUM: 3.7 mmol/L (ref 3.5–5.1)
SODIUM: 144 mmol/L (ref 136–145)
Total Bilirubin: 0.3 mg/dL (ref 0.2–1.2)
Total Protein: 8.7 g/dL — ABNORMAL HIGH (ref 6.4–8.3)

## 2018-02-14 LAB — CBC WITH DIFFERENTIAL/PLATELET
Basophils Absolute: 0 10*3/uL (ref 0.0–0.1)
Basophils Relative: 0 %
EOS PCT: 2 %
Eosinophils Absolute: 0.1 10*3/uL (ref 0.0–0.5)
HCT: 37 % (ref 34.8–46.6)
Hemoglobin: 11.7 g/dL (ref 11.6–15.9)
LYMPHS ABS: 3.6 10*3/uL — AB (ref 0.9–3.3)
Lymphocytes Relative: 56 %
MCH: 29.6 pg (ref 25.1–34.0)
MCHC: 31.6 g/dL (ref 31.5–36.0)
MCV: 93.7 fL (ref 79.5–101.0)
MONOS PCT: 9 %
Monocytes Absolute: 0.6 10*3/uL (ref 0.1–0.9)
NEUTROS PCT: 33 %
Neutro Abs: 2.1 10*3/uL (ref 1.5–6.5)
Platelets: 223 10*3/uL (ref 145–400)
RBC: 3.95 MIL/uL (ref 3.70–5.45)
RDW: 15.4 % — ABNORMAL HIGH (ref 11.2–14.5)
WBC: 6.4 10*3/uL (ref 3.9–10.3)

## 2018-02-14 LAB — SEDIMENTATION RATE: SED RATE: 49 mm/h — AB (ref 0–22)

## 2018-02-14 LAB — HCG, SERUM, QUALITATIVE: Preg, Serum: POSITIVE — AB

## 2018-02-14 LAB — LACTATE DEHYDROGENASE: LDH: 318 U/L — AB (ref 125–245)

## 2018-02-14 NOTE — Telephone Encounter (Signed)
Appointment scheduled AVS/Calendar printed per 6/13 los

## 2018-02-15 ENCOUNTER — Encounter: Payer: Medicare Other | Admitting: Nurse Practitioner

## 2018-02-15 LAB — RHEUMATOID ARTHRITIS PROFILE
CCP Antibodies IgG/IgA: 8 units (ref 0–19)
Rhuematoid fact SerPl-aCnc: <10 IU/mL (ref 0.0–13.9)

## 2018-02-15 LAB — FANA STAINING PATTERNS: Speckled Pattern: 1:80 {titer}

## 2018-02-15 LAB — THYROID PANEL WITH TSH
Free Thyroxine Index: 1.4 (ref 1.2–4.9)
T3 UPTAKE RATIO: 22 % — AB (ref 24–39)
T4, Total: 6.4 ug/dL (ref 4.5–12.0)
TSH: 1.86 u[IU]/mL (ref 0.450–4.500)

## 2018-02-15 LAB — ANTINUCLEAR ANTIBODIES, IFA: ANA Ab, IFA: POSITIVE — AB

## 2018-02-17 LAB — MULTIPLE MYELOMA PANEL, SERUM
Albumin SerPl Elph-Mcnc: 3.5 g/dL (ref 2.9–4.4)
Albumin/Glob SerPl: 0.9 (ref 0.7–1.7)
Alpha 1: 0.2 g/dL (ref 0.0–0.4)
Alpha2 Glob SerPl Elph-Mcnc: 1 g/dL (ref 0.4–1.0)
B-Globulin SerPl Elph-Mcnc: 1.5 g/dL — ABNORMAL HIGH (ref 0.7–1.3)
GAMMA GLOB SERPL ELPH-MCNC: 1.4 g/dL (ref 0.4–1.8)
GLOBULIN, TOTAL: 4.1 g/dL — AB (ref 2.2–3.9)
IGA: 420 mg/dL — AB (ref 87–352)
IGM (IMMUNOGLOBULIN M), SRM: 157 mg/dL (ref 26–217)
IgG (Immunoglobin G), Serum: 1367 mg/dL (ref 700–1600)
Total Protein ELP: 7.6 g/dL (ref 6.0–8.5)

## 2018-02-18 ENCOUNTER — Ambulatory Visit (INDEPENDENT_AMBULATORY_CARE_PROVIDER_SITE_OTHER): Payer: Medicare Other | Admitting: *Deleted

## 2018-02-18 DIAGNOSIS — R001 Bradycardia, unspecified: Secondary | ICD-10-CM | POA: Diagnosis not present

## 2018-02-18 NOTE — Progress Notes (Signed)
Remote pacemaker transmission.   

## 2018-02-19 LAB — CUP PACEART REMOTE DEVICE CHECK
Brady Statistic AP VP Percent: 5.58 %
Brady Statistic AP VS Percent: 19.18 %
Brady Statistic AS VP Percent: 59.51 %
Brady Statistic AS VS Percent: 15.72 %
Brady Statistic RV Percent Paced: 65.09 %
Implantable Lead Location: 753859
Implantable Lead Model: 5076
Implantable Pulse Generator Implant Date: 20180614
Lead Channel Impedance Value: 304 Ohm
Lead Channel Impedance Value: 361 Ohm
Lead Channel Impedance Value: 513 Ohm
Lead Channel Impedance Value: 646 Ohm
Lead Channel Pacing Threshold Amplitude: 0.625 V
Lead Channel Pacing Threshold Amplitude: 1.25 V
Lead Channel Pacing Threshold Pulse Width: 0.4 ms
Lead Channel Sensing Intrinsic Amplitude: 4.5 mV
Lead Channel Sensing Intrinsic Amplitude: 4.5 mV
Lead Channel Setting Pacing Amplitude: 2 V
Lead Channel Setting Pacing Amplitude: 2 V
Lead Channel Setting Pacing Pulse Width: 1 ms
Lead Channel Setting Sensing Sensitivity: 2 mV
MDC IDC LEAD IMPLANT DT: 20180614
MDC IDC LEAD IMPLANT DT: 20180614
MDC IDC LEAD LOCATION: 753859
MDC IDC MSMT BATTERY REMAINING LONGEVITY: 134 mo
MDC IDC MSMT BATTERY VOLTAGE: 3.05 V
MDC IDC MSMT LEADCHNL RA PACING THRESHOLD PULSEWIDTH: 0.4 ms
MDC IDC MSMT LEADCHNL RA SENSING INTR AMPL: 3.5 mV
MDC IDC MSMT LEADCHNL RA SENSING INTR AMPL: 3.5 mV
MDC IDC SESS DTM: 20190617053150
MDC IDC STAT BRADY RA PERCENT PACED: 24.7 %

## 2018-02-20 ENCOUNTER — Ambulatory Visit (HOSPITAL_COMMUNITY)
Admission: RE | Admit: 2018-02-20 | Discharge: 2018-02-20 | Disposition: A | Discharge status: Home / Self Care | Payer: Medicare Other | Source: Ambulatory Visit | Attending: Hematology | Admitting: Hematology

## 2018-02-20 ENCOUNTER — Encounter (HOSPITAL_COMMUNITY): Payer: Self-pay

## 2018-02-20 DIAGNOSIS — R634 Abnormal weight loss: Secondary | ICD-10-CM | POA: Diagnosis not present

## 2018-02-20 DIAGNOSIS — R140 Abdominal distension (gaseous): Secondary | ICD-10-CM

## 2018-02-20 MED ORDER — IOPAMIDOL (ISOVUE-300) INJECTION 61%
INTRAVENOUS | Status: AC
  Filled 2018-02-20: qty 100

## 2018-02-20 MED ORDER — IOPAMIDOL (ISOVUE-300) INJECTION 61%
100.0000 mL | Freq: Once | INTRAVENOUS | Status: AC | PRN
Start: 2018-02-20 — End: 2018-02-20
  Administered 2018-02-20: 100 mL via INTRAVENOUS

## 2018-03-02 ENCOUNTER — Other Ambulatory Visit: Payer: Self-pay | Admitting: Neurology

## 2018-04-19 ENCOUNTER — Other Ambulatory Visit: Payer: Self-pay | Admitting: Family Medicine

## 2018-05-13 ENCOUNTER — Other Ambulatory Visit: Payer: Self-pay

## 2018-05-13 ENCOUNTER — Ambulatory Visit (HOSPITAL_COMMUNITY): Payer: Medicare Other | Attending: Cardiology

## 2018-05-13 DIAGNOSIS — R0600 Dyspnea, unspecified: Secondary | ICD-10-CM | POA: Diagnosis not present

## 2018-05-13 DIAGNOSIS — Z950 Presence of cardiac pacemaker: Secondary | ICD-10-CM

## 2018-05-13 DIAGNOSIS — I452 Bifascicular block: Secondary | ICD-10-CM

## 2018-05-13 DIAGNOSIS — R0602 Shortness of breath: Secondary | ICD-10-CM | POA: Diagnosis not present

## 2018-05-13 DIAGNOSIS — G7111 Myotonic muscular dystrophy: Secondary | ICD-10-CM | POA: Diagnosis not present

## 2018-05-13 DIAGNOSIS — Z87891 Personal history of nicotine dependence: Secondary | ICD-10-CM | POA: Insufficient documentation

## 2018-05-13 DIAGNOSIS — I517 Cardiomegaly: Secondary | ICD-10-CM | POA: Diagnosis not present

## 2018-05-13 DIAGNOSIS — D649 Anemia, unspecified: Secondary | ICD-10-CM | POA: Insufficient documentation

## 2018-05-13 DIAGNOSIS — I4510 Unspecified right bundle-branch block: Secondary | ICD-10-CM | POA: Diagnosis not present

## 2018-05-13 DIAGNOSIS — I441 Atrioventricular block, second degree: Secondary | ICD-10-CM | POA: Diagnosis not present

## 2018-05-13 NOTE — Progress Notes (Signed)
Notes recorded by Sueanne Margarita, MD on 05/13/2018 at 3:39 PM EDT Echo showed normal LVF with mildly decreased RVF likely related to RV pacing. Repeat limited echo in 6 months to assess LV and RV function

## 2018-05-20 ENCOUNTER — Telehealth: Payer: Self-pay | Admitting: Cardiology

## 2018-05-20 ENCOUNTER — Ambulatory Visit (INDEPENDENT_AMBULATORY_CARE_PROVIDER_SITE_OTHER): Payer: Medicare Other | Admitting: *Deleted

## 2018-05-20 DIAGNOSIS — I441 Atrioventricular block, second degree: Secondary | ICD-10-CM

## 2018-05-20 NOTE — Telephone Encounter (Signed)
LMOVM reminding pt to send remote transmission.   

## 2018-05-21 NOTE — Progress Notes (Signed)
Remote pacemaker transmission.   

## 2018-06-12 LAB — CUP PACEART REMOTE DEVICE CHECK
Battery Remaining Longevity: 129 mo
Brady Statistic AP VP Percent: 10.17 %
Brady Statistic AP VS Percent: 17.83 %
Brady Statistic AS VS Percent: 14.36 %
Implantable Lead Implant Date: 20180614
Implantable Lead Location: 753859
Implantable Lead Location: 753859
Implantable Lead Model: 5076
Lead Channel Impedance Value: 285 Ohm
Lead Channel Impedance Value: 342 Ohm
Lead Channel Impedance Value: 475 Ohm
Lead Channel Pacing Threshold Amplitude: 1.25 V
Lead Channel Pacing Threshold Pulse Width: 0.4 ms
Lead Channel Pacing Threshold Pulse Width: 0.4 ms
Lead Channel Sensing Intrinsic Amplitude: 3.375 mV
Lead Channel Sensing Intrinsic Amplitude: 3.375 mV
Lead Channel Sensing Intrinsic Amplitude: 5.5 mV
Lead Channel Setting Pacing Amplitude: 2 V
MDC IDC LEAD IMPLANT DT: 20180614
MDC IDC MSMT BATTERY VOLTAGE: 3.03 V
MDC IDC MSMT LEADCHNL RA PACING THRESHOLD AMPLITUDE: 0.75 V
MDC IDC MSMT LEADCHNL RV IMPEDANCE VALUE: 608 Ohm
MDC IDC MSMT LEADCHNL RV SENSING INTR AMPL: 5.5 mV
MDC IDC PG IMPLANT DT: 20180614
MDC IDC SESS DTM: 20190917053438
MDC IDC SET LEADCHNL RA PACING AMPLITUDE: 2 V
MDC IDC SET LEADCHNL RV PACING PULSEWIDTH: 1 ms
MDC IDC SET LEADCHNL RV SENSING SENSITIVITY: 2 mV
MDC IDC STAT BRADY AS VP PERCENT: 57.64 %
MDC IDC STAT BRADY RA PERCENT PACED: 27.94 %
MDC IDC STAT BRADY RV PERCENT PACED: 67.82 %

## 2018-08-19 ENCOUNTER — Ambulatory Visit (INDEPENDENT_AMBULATORY_CARE_PROVIDER_SITE_OTHER): Payer: Medicare Other

## 2018-08-19 DIAGNOSIS — I441 Atrioventricular block, second degree: Secondary | ICD-10-CM

## 2018-08-19 DIAGNOSIS — R001 Bradycardia, unspecified: Secondary | ICD-10-CM

## 2018-08-19 NOTE — Progress Notes (Signed)
Remote pacemaker transmission.   

## 2018-08-20 ENCOUNTER — Encounter: Payer: Self-pay | Admitting: Cardiology

## 2018-08-27 ENCOUNTER — Other Ambulatory Visit: Payer: Self-pay

## 2018-08-27 ENCOUNTER — Encounter (HOSPITAL_COMMUNITY): Payer: Self-pay | Admitting: Emergency Medicine

## 2018-08-27 ENCOUNTER — Emergency Department (HOSPITAL_COMMUNITY)
Admission: EM | Admit: 2018-08-27 | Discharge: 2018-08-28 | Disposition: A | Discharge status: Home / Self Care | Payer: Medicare Other | Attending: Emergency Medicine | Admitting: Emergency Medicine

## 2018-08-27 ENCOUNTER — Emergency Department (HOSPITAL_COMMUNITY): Payer: Medicare Other

## 2018-08-27 DIAGNOSIS — Z95811 Presence of heart assist device: Secondary | ICD-10-CM | POA: Insufficient documentation

## 2018-08-27 DIAGNOSIS — D573 Sickle-cell trait: Secondary | ICD-10-CM | POA: Insufficient documentation

## 2018-08-27 DIAGNOSIS — F1290 Cannabis use, unspecified, uncomplicated: Secondary | ICD-10-CM | POA: Insufficient documentation

## 2018-08-27 DIAGNOSIS — Z79899 Other long term (current) drug therapy: Secondary | ICD-10-CM | POA: Diagnosis not present

## 2018-08-27 DIAGNOSIS — R197 Diarrhea, unspecified: Secondary | ICD-10-CM | POA: Diagnosis not present

## 2018-08-27 DIAGNOSIS — E876 Hypokalemia: Secondary | ICD-10-CM | POA: Insufficient documentation

## 2018-08-27 DIAGNOSIS — R112 Nausea with vomiting, unspecified: Secondary | ICD-10-CM | POA: Diagnosis not present

## 2018-08-27 DIAGNOSIS — R05 Cough: Secondary | ICD-10-CM | POA: Diagnosis not present

## 2018-08-27 DIAGNOSIS — G7111 Myotonic muscular dystrophy: Secondary | ICD-10-CM | POA: Insufficient documentation

## 2018-08-27 DIAGNOSIS — J181 Lobar pneumonia, unspecified organism: Secondary | ICD-10-CM

## 2018-08-27 DIAGNOSIS — R42 Dizziness and giddiness: Secondary | ICD-10-CM | POA: Diagnosis not present

## 2018-08-27 DIAGNOSIS — R1084 Generalized abdominal pain: Secondary | ICD-10-CM | POA: Diagnosis not present

## 2018-08-27 DIAGNOSIS — R0902 Hypoxemia: Secondary | ICD-10-CM | POA: Diagnosis not present

## 2018-08-27 DIAGNOSIS — Z87891 Personal history of nicotine dependence: Secondary | ICD-10-CM | POA: Insufficient documentation

## 2018-08-27 DIAGNOSIS — R000 Tachycardia, unspecified: Secondary | ICD-10-CM | POA: Diagnosis not present

## 2018-08-27 DIAGNOSIS — J189 Pneumonia, unspecified organism: Secondary | ICD-10-CM | POA: Insufficient documentation

## 2018-08-27 DIAGNOSIS — R52 Pain, unspecified: Secondary | ICD-10-CM | POA: Diagnosis not present

## 2018-08-27 DIAGNOSIS — R0602 Shortness of breath: Secondary | ICD-10-CM | POA: Diagnosis not present

## 2018-08-27 LAB — CBC WITH DIFFERENTIAL/PLATELET
ABS IMMATURE GRANULOCYTES: 0.05 10*3/uL (ref 0.00–0.07)
BASOS PCT: 0 %
Basophils Absolute: 0 10*3/uL (ref 0.0–0.1)
EOS ABS: 0 10*3/uL (ref 0.0–0.5)
Eosinophils Relative: 0 %
HCT: 35.8 % — ABNORMAL LOW (ref 36.0–46.0)
Hemoglobin: 11 g/dL — ABNORMAL LOW (ref 12.0–15.0)
IMMATURE GRANULOCYTES: 1 %
Lymphocytes Relative: 15 %
Lymphs Abs: 1.3 10*3/uL (ref 0.7–4.0)
MCH: 28.7 pg (ref 26.0–34.0)
MCHC: 30.7 g/dL (ref 30.0–36.0)
MCV: 93.5 fL (ref 80.0–100.0)
Monocytes Absolute: 0.6 10*3/uL (ref 0.1–1.0)
Monocytes Relative: 7 %
NEUTROS ABS: 6.5 10*3/uL (ref 1.7–7.7)
NEUTROS PCT: 77 %
PLATELETS: 197 10*3/uL (ref 150–400)
RBC: 3.83 MIL/uL — ABNORMAL LOW (ref 3.87–5.11)
RDW: 15.8 % — AB (ref 11.5–15.5)
WBC: 8.4 10*3/uL (ref 4.0–10.5)
nRBC: 0 % (ref 0.0–0.2)

## 2018-08-27 LAB — COMPREHENSIVE METABOLIC PANEL
ALBUMIN: 2.9 g/dL — AB (ref 3.5–5.0)
ALK PHOS: 53 U/L (ref 38–126)
ALT: 15 U/L (ref 0–44)
AST: 29 U/L (ref 15–41)
Anion gap: 10 (ref 5–15)
BILIRUBIN TOTAL: 0.5 mg/dL (ref 0.3–1.2)
BUN: 13 mg/dL (ref 6–20)
CALCIUM: 8.9 mg/dL (ref 8.9–10.3)
CO2: 23 mmol/L (ref 22–32)
Chloride: 108 mmol/L (ref 98–111)
Creatinine, Ser: 0.76 mg/dL (ref 0.44–1.00)
GFR calc Af Amer: >60 mL/min (ref >60)
GFR calc non Af Amer: >60 mL/min (ref >60)
GLUCOSE: 132 mg/dL — AB (ref 70–99)
POTASSIUM: 3.3 mmol/L — AB (ref 3.5–5.1)
Sodium: 141 mmol/L (ref 135–145)
TOTAL PROTEIN: 8.1 g/dL (ref 6.5–8.1)

## 2018-08-27 LAB — URINALYSIS, ROUTINE W REFLEX MICROSCOPIC
BILIRUBIN URINE: NEGATIVE
GLUCOSE, UA: NEGATIVE mg/dL
KETONES UR: 5 mg/dL — AB
LEUKOCYTES UA: NEGATIVE
Nitrite: NEGATIVE
PH: 5 (ref 5.0–8.0)
Protein, ur: 100 mg/dL — AB
SPECIFIC GRAVITY, URINE: 1.025 (ref 1.005–1.030)

## 2018-08-27 LAB — POC URINE PREG, ED: Preg Test, Ur: NEGATIVE

## 2018-08-27 MED ORDER — SODIUM CHLORIDE 0.9 % IV BOLUS
1000.0000 mL | Freq: Once | INTRAVENOUS | Status: AC
  Administered 2018-08-27: 1000 mL via INTRAVENOUS

## 2018-08-27 MED ORDER — LOPERAMIDE HCL 2 MG PO CAPS
4.0000 mg | ORAL_CAPSULE | Freq: Once | ORAL | Status: AC
  Administered 2018-08-27: 4 mg via ORAL
  Filled 2018-08-27: qty 2

## 2018-08-27 MED ORDER — ONDANSETRON HCL 4 MG/2ML IJ SOLN
4.0000 mg | Freq: Once | INTRAMUSCULAR | Status: AC
  Administered 2018-08-27: 4 mg via INTRAVENOUS
  Filled 2018-08-27: qty 2

## 2018-08-27 MED ORDER — IPRATROPIUM-ALBUTEROL 0.5-2.5 (3) MG/3ML IN SOLN
3.0000 mL | Freq: Once | RESPIRATORY_TRACT | Status: AC
  Administered 2018-08-27: 3 mL via RESPIRATORY_TRACT
  Filled 2018-08-27: qty 3

## 2018-08-27 NOTE — ED Triage Notes (Addendum)
BIB EMS from home. Reports N/V/D for 2 days with generalized weakness since yesterday. Hx muscular dystrophy, pacemaker. Negative for orthostatics per EMS. Denies recent abx use, or admission to nursing facility.

## 2018-08-27 NOTE — ED Provider Notes (Signed)
[facility] EMERGENCY DEPARTMENT Provider Note   CSN: [id number] Arrival date & time: 08/27/18  2237     History   Chief Complaint Chief Complaint  Patient presents with  . Fatigue  . Nausea  . Emesis    HPI Cynthia Underwood is a 47 y.o. female.  The history is provided by the patient.  Emesis    She has history of myotonic dystrophy, sickle cell trait, cardiac pacemaker and comes in with 2-day history of dry cough, nausea, vomiting, diarrhea.  She is feeling generally weak.  There has been subjective fever but no chills or sweats.  She denies arthralgias or myalgias.  There have been no known sick contacts.  She has not done anything to treat her symptoms at home.  Past Medical History:  Diagnosis Date  . Anemia   . Bifascicular block 10/02/2014  . Depression   . Excess ear wax   . Myotonic dystrophy, type 1 ([place]) dx'd ~ 2005   Genetically confirmed  . Presence of permanent cardiac pacemaker   . 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 Sickle cell trait ([place])   . Watery eyes    Left  . Weakness of both legs     Patient Active Problem List   Diagnosis Date Noted  . Weakness of both legs   . Watery eyes   . Sickle cell trait ([place])   . Presence of permanent cardiac pacemaker   . Excess ear wax   . Depression   . Anemia   . Cardiac pacemaker in situ 03/01/2017  . [place]itz type 2 second degree heart block 02/15/2017  . Symptomatic advanced heart block 02/12/2017  . SOB (shortness of breath) 10/02/2014  . 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 Bifascicular block 10/02/2014  . Infection due to trichomonas vaginalis 09/15/2014  . Myotonic dystrophy, type 1 ([place]) 09/14/2014    Past Surgical History:  Procedure Laterality Date  . BREAST BIOPSY Right   . EYE SURGERY Left 2017   "related to blockage in my nose"  . INSERT / REPLACE / REMOVE PACEMAKER  02/15/2017    . PACEMAKER IMPLANT N/A 02/15/2017   Procedure: Pacemaker Implant;  Surgeon: Deboraha Sprang, MD;  Location: [facility];  Service: Cardiovascular;  Laterality: N/A;     OB History    Gravida  2   Para      Term      Preterm      AB  2   Living  0     SAB      TAB  2   Ectopic      Multiple      Live Births               Home Medications    Prior to Admission medications   Medication Sig Start Date End Date Taking? Authorizing Provider  atorvastatin (LIPITOR) 20 MG tablet TAKE 1 TABLET BY MOUTH EVERY DAY 04/19/18   Charlott Rakes, MD  mirtazapine (REMERON) 7.5 MG tablet Take 1/2 tablet daily at bedtime. 03/04/18   Patel, Arvin Collard K, DO  naproxen (NAPROSYN) 500 MG tablet Take 1 tablet (500 mg total) by mouth every 12 (twelve) hours as needed for moderate pain. 12/14/17   Alda Berthold, DO    Family History Family History  Problem Relation Age of Onset  . Cancer Father 74  Deceased  . Heart disease Father   . COPD Mother   . Diabetes Maternal Uncle   . Heart disease Maternal Uncle   . Heart disease Paternal Grandmother   . Diabetes Paternal Grandmother   . Hypertension Paternal Grandmother   . Neuromuscular disorder Maternal Aunt        Myotonic dystrophy  . Neuromuscular disorder Cousin        Myotonic dystrophy  . Neuromuscular disorder Sister        Myotonic dystrophy    Social History Social History   Tobacco Use  . Smoking status: Former Smoker    Packs/day: 0.00    Years: 28.00    Pack years: 0.00    Types: Cigarettes    Last attempt to quit: 10/04/2016    Years since quitting: 1.8  . Smokeless tobacco: Never Used  Substance Use Topics  . Alcohol use: Yes    Alcohol/week: 0.0 standard drinks    Comment: 02/15/2017 "maybe 6 times/year"  . Drug use: Yes    Frequency: 2.0 times per week    Types: Marijuana    Comment: 02/15/2017 "couple times/month"     Allergies   Penicillins   Review of Systems Review of Systems   Gastrointestinal: Positive for vomiting.  All other systems reviewed and are negative.    Physical Exam Updated Vital Signs BP 102/63   Pulse 85   Temp 98.3 F (36.8 C) (Oral)   Resp (!) 22   Ht 5\' 3"  (1.6 m)   Wt 54 kg   SpO2 91%   BMI 21.08 kg/m   Physical Exam Vitals signs and nursing note reviewed.    47 year old female, resting comfortably and in no acute distress. Vital signs are significant for mildly elevated respiratory rate. Oxygen saturation is 91%, which is normal. Head is normocephalic and atraumatic. PERRLA, EOMI. Oropharynx is clear. Neck is nontender and supple without adenopathy or JVD. Back is nontender and there is no CVA tenderness. Lungs are clear without rales, wheezes, or rhonchi. Chest is nontender. Heart has regular rate and rhythm without murmur. Abdomen is soft, flat, nontender without masses or hepatosplenomegaly and peristalsis is hypoactive. Extremities have no cyanosis or edema, full range of motion is present. Skin is warm and dry without rash. Neurologic: Mental status is normal, cranial nerves are intact, there are no motor or sensory deficits.  ED Treatments / Results  Labs (all labs ordered are listed, but only abnormal results are displayed) Labs Reviewed - No data to display  Radiology No results found.  Procedures Procedures   Medications Ordered in ED Medications  sodium chloride 0.9 % bolus 1,000 mL (has no administration in time range)  ondansetron (ZOFRAN) injection 4 mg (has no administration in time range)  loperamide (IMODIUM) capsule 4 mg (has no administration in time range)     Initial Impression / Assessment and Plan / ED Course  I have reviewed the triage vital signs and the nursing notes.  Pertinent labs & imaging results that were available during my care of the patient were reviewed by me and considered in my medical decision making (see chart for details).  Cough, vomiting, diarrhea.  Probable viral  illness, possible influenza.  She is outside the treatment window for antiviral medication, so will not check influenza PCR.  Will check chest x-ray and screening labs.  She will be given IV fluid, IV ondansetron, oral loperamide.  We will also give nebulizer treatment with albuterol and ipratropium.  Old  records are reviewed, and she has no relevant past visits.  Labs are unremarkable.  Mild hypokalemia is present and she is given a dose of oral potassium.  Chest x-ray appears to show early lingular infiltrate.  Patient is nontoxic and does not need to be admitted.  She is given initial dose of cefdinir.  She feels better following IV fluids, ondansetron, loperamide, albuterol and ipratropium.  She is discharged with prescription for albuterol inhaler, oral ondansetron and cefdinir.  Return precautions discussed.  Also, she does have mild hypokalemia and is given a single dose of potassium in the emergency department.  Final Clinical Impressions(s) / ED Diagnoses   Final diagnoses:  Nausea vomiting and diarrhea  Hypokalemia  Community acquired pneumonia of right upper lobe of lung [facility])    ED Discharge Orders         Ordered    cefdinir (OMNICEF) 300 MG capsule  2 times daily     08/28/18 0225    ondansetron (ZOFRAN) 4 MG tablet  Every 6 hours PRN     08/28/18 0225    albuterol (PROVENTIL HFA;VENTOLIN HFA) 108 (90 Base) MCG/ACT inhaler  Every 4 hours PRN     08/28/18 9735           Delora Fuel, MD 32/99/24 0230

## 2018-08-28 DIAGNOSIS — R112 Nausea with vomiting, unspecified: Secondary | ICD-10-CM | POA: Diagnosis not present

## 2018-08-28 MED ORDER — ALUM & MAG HYDROXIDE-SIMETH 200-200-20 MG/5ML PO SUSP
30.0000 mL | Freq: Once | ORAL | Status: AC
  Administered 2018-08-28: 30 mL via ORAL
  Filled 2018-08-28: qty 30

## 2018-08-28 MED ORDER — POTASSIUM CHLORIDE CRYS ER 20 MEQ PO TBCR
40.0000 meq | EXTENDED_RELEASE_TABLET | Freq: Once | ORAL | Status: AC
  Administered 2018-08-28: 40 meq via ORAL
  Filled 2018-08-28: qty 2

## 2018-08-28 MED ORDER — CEFDINIR 300 MG PO CAPS
300.0000 mg | ORAL_CAPSULE | Freq: Once | ORAL | Status: AC
  Administered 2018-08-28: 300 mg via ORAL
  Filled 2018-08-28: qty 1

## 2018-08-28 MED ORDER — ONDANSETRON HCL 4 MG PO TABS: 4.0000 mg | ORAL_TABLET | Freq: Four times a day (QID) | ORAL | 0 refills | Status: DC | PRN

## 2018-08-28 MED ORDER — CEFDINIR 300 MG PO CAPS: 300.0000 mg | ORAL_CAPSULE | Freq: Two times a day (BID) | ORAL | 0 refills | Status: DC

## 2018-08-28 MED ORDER — ALBUTEROL SULFATE HFA 108 (90 BASE) MCG/ACT IN AERS: 2.0000 | INHALATION_SPRAY | RESPIRATORY_TRACT | 0 refills | Status: DC | PRN

## 2018-08-28 MED ORDER — DEXAMETHASONE SODIUM PHOSPHATE 10 MG/ML IJ SOLN
10.0000 mg | Freq: Once | INTRAMUSCULAR | Status: AC
Start: 2018-08-28 — End: 2018-08-28
  Administered 2018-08-28: 10 mg via INTRAVENOUS
  Filled 2018-08-28: qty 1

## 2018-08-28 NOTE — ED Notes (Signed)
Reviewed d/c instructions with pt, who verbalized understanding and had no outstanding questions. Pt departed in NAD.   

## 2018-08-28 NOTE — ED Notes (Signed)
Patient transported to X-ray 

## 2018-08-28 NOTE — Discharge Instructions (Signed)
Take loperamide (Imodium AD) as needed for diarrhea.  Return if symptoms get worse.

## 2018-09-03 ENCOUNTER — Encounter: Payer: Self-pay | Admitting: Family Medicine

## 2018-09-03 ENCOUNTER — Ambulatory Visit: Payer: Medicare Other | Attending: Family Medicine | Admitting: Family Medicine

## 2018-09-03 VITALS — BP 120/71 | HR 78 | Temp 98.6°F | Resp 18 | Ht 63.0 in | Wt 117.0 lb

## 2018-09-03 DIAGNOSIS — R8290 Unspecified abnormal findings in urine: Secondary | ICD-10-CM | POA: Diagnosis not present

## 2018-09-03 DIAGNOSIS — R05 Cough: Secondary | ICD-10-CM | POA: Diagnosis present

## 2018-09-03 DIAGNOSIS — E876 Hypokalemia: Secondary | ICD-10-CM | POA: Insufficient documentation

## 2018-09-03 DIAGNOSIS — J181 Lobar pneumonia, unspecified organism: Secondary | ICD-10-CM | POA: Diagnosis not present

## 2018-09-03 DIAGNOSIS — E860 Dehydration: Secondary | ICD-10-CM

## 2018-09-03 DIAGNOSIS — G7111 Myotonic muscular dystrophy: Secondary | ICD-10-CM | POA: Insufficient documentation

## 2018-09-03 DIAGNOSIS — J189 Pneumonia, unspecified organism: Secondary | ICD-10-CM

## 2018-09-03 DIAGNOSIS — Z880 Allergy status to penicillin: Secondary | ICD-10-CM | POA: Insufficient documentation

## 2018-09-03 DIAGNOSIS — R82998 Other abnormal findings in urine: Secondary | ICD-10-CM

## 2018-09-03 DIAGNOSIS — Z950 Presence of cardiac pacemaker: Secondary | ICD-10-CM | POA: Insufficient documentation

## 2018-09-03 DIAGNOSIS — H6123 Impacted cerumen, bilateral: Secondary | ICD-10-CM

## 2018-09-03 DIAGNOSIS — R059 Cough, unspecified: Secondary | ICD-10-CM

## 2018-09-03 DIAGNOSIS — D573 Sickle-cell trait: Secondary | ICD-10-CM | POA: Insufficient documentation

## 2018-09-03 DIAGNOSIS — R197 Diarrhea, unspecified: Secondary | ICD-10-CM

## 2018-09-03 DIAGNOSIS — Z87891 Personal history of nicotine dependence: Secondary | ICD-10-CM | POA: Diagnosis not present

## 2018-09-03 LAB — POCT URINALYSIS DIP (CLINITEK)
Glucose, UA: NEGATIVE mg/dL
Nitrite: NEGATIVE
POC PROTEIN,UA: 100 — AB
Spec Grav, UA: 1.025
Urobilinogen, UA: 1 U/dL
pH, UA: 5.5

## 2018-09-03 MED ORDER — LEVOFLOXACIN 500 MG PO TABS: 500.0000 mg | ORAL_TABLET | Freq: Every day | ORAL | 0 refills | Status: DC

## 2018-09-03 MED ORDER — GUAIFENESIN-CODEINE 100-10 MG/5ML PO SOLN: 5.0000 mL | Freq: Three times a day (TID) | ORAL | 0 refills | Status: DC | PRN

## 2018-09-03 NOTE — Progress Notes (Signed)
Subjective:    Patient ID: Cynthia Underwood, female    DOB: May 19, 1971, 47 y.o.   MRN: [id number]  HPI       47 year old female who presents in follow-up of emergency department visit on 08/27/2018. Patient was seen due to approximately 2 day onset of cough along with nausea/vomiting and diarrhea. Patient reports that she was diagnosed with pneumonia and she continues to have a recurrent cough and fatigue. Patient reports that the mucus she coughs up is now mostly white.  Patient however states that she has episodes of recurrent cough until she feels slightly short of breath.  Patient denies any current fever or chills.  Patient reports that she continues to have diarrhea.  Patient states that she was given something at the emergency room which helped with her diarrhea for about a day and patient states that she was told to take Imodium at home but patient states that she had an episode in the past when she developed severe constipation after use of Imodium therefore she has been afraid to take the Imodium and she continues to have diarrhea.  Patient did have low potassium at the emergency department.  Patient denies any current muscle cramping but patient with a history of myotonic dystrophy and has some generalized muscle weakness as well as difficulty swallowing.  Patient also has a history of Mobitz type II second-degree heart block and bifascicular block for which she is status post pacemaker placement.  Patient was also slightly anemic during her ED visit but patient has history of sickle cell trait.  Patient denies any current issues with chest pain and no unusual bruising or bleeding.  Patient denies dysuria.  On review of ED notes, patient did have abnormal urinalysis but no indication that urine was sent for culture.      Patient also with complaint of longstanding issues with buildup of wax in her ears.  Patient states that she is having difficulty hearing secondary to the wax buildup.  Patient  would like a referral to ENT to have wax removed from her ears.  Patient states that she has tried over-the-counter eardrops to dissolve the wax in the past without success.  Patient denies any actual ear pain.  Past Medical History:  Diagnosis Date  . Anemia   . Bifascicular block 10/02/2014  . Depression   . Excess ear wax   . 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 Presence of permanent cardiac pacemaker   . 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 Sickle cell trait ([place])   . Watery eyes    Left  . Weakness of both legs    Past Surgical History:  Procedure Laterality Date  . BREAST BIOPSY Right   . EYE SURGERY Left 2017   "related to blockage in my nose"  . INSERT / REPLACE / REMOVE PACEMAKER  02/15/2017  . PACEMAKER IMPLANT N/A 02/15/2017   Procedure: Pacemaker Implant;  Surgeon: Deboraha Sprang, MD;  Location: [facility];  Service: Cardiovascular;  Laterality: N/A;   Family History  Problem Relation Age of Onset  . Cancer Father 26       Deceased  . Heart disease Father   . COPD Mother   . Diabetes Maternal Uncle   . Heart disease Maternal Uncle   . Heart disease Paternal Grandmother   . Diabetes Paternal Grandmother   . Hypertension Paternal Grandmother   .  Neuromuscular disorder Maternal Aunt        Myotonic dystrophy  . Neuromuscular disorder Cousin        Myotonic dystrophy  . Neuromuscular disorder Sister        Myotonic dystrophy   Social History   Tobacco Use  . Smoking status: Former Smoker    Packs/day: 0.00    Years: 28.00    Pack years: 0.00    Types: Cigarettes    Last attempt to quit: 10/04/2016    Years since quitting: 1.9  . Smokeless tobacco: Never Used  Substance Use Topics  . Alcohol use: Yes    Alcohol/week: 0.0 standard drinks    Comment: 02/15/2017 "maybe 6 times/year"  . Drug use: Yes    Frequency: 2.0 times per week    Types: Marijuana    Comment:  02/15/2017 "couple times/month"   Allergies  Allergen Reactions  . Penicillins Nausea And Vomiting, Rash and Other (See Comments)    Fever Has patient had a PCN reaction causing immediate rash, facial/tongue/throat swelling, SOB or lightheadedness with hypotension: Yes Has patient had a PCN reaction causing severe rash involving mucus membranes or skin necrosis: Unknown Has patient had a PCN reaction that required hospitalization: No Has patient had a PCN reaction occurring within the last 10 years: No If all of the above answers are "NO", then may proceed with Cephalosporin use.            Review of Systems  Constitutional: Positive for fatigue. Negative for chills and fever.  HENT: Positive for congestion, hearing loss and postnasal drip. Negative for sore throat and trouble swallowing.   Cardiovascular: Negative for chest pain, palpitations and leg swelling.  Gastrointestinal: Positive for diarrhea. Negative for abdominal pain and blood in stool.  Genitourinary: Positive for flank pain and frequency. Negative for dysuria.  Musculoskeletal: Positive for back pain and myalgias.  Neurological: Positive for light-headedness. Negative for dizziness and headaches.  Hematological: Negative for adenopathy. Does not bruise/bleed easily.       Objective:   Physical Exam BP 120/71 (BP Location: Left Arm, Patient Position: Sitting, Cuff Size: Normal)   Pulse 64   Temp 98.6 F (37 C) (Oral)   Resp 18   Ht 5\' 3"  (1.6 m)   Wt 117 lb (53.1 kg)   SpO2 94%   BMI 20.73 kg/m  nurses notes and vital signs reviewed General- thin framed female in no acute distress but patient appears fatigued.  Patient with occasional, infrequent cough while in the exam room.  Patient frequently leans forward and ask for questions to be repeated due to her difficulty hearing ENT- TMs obscured by impacted cerumen in the canals and patient with very hard/dry appearing cerumen in the left ear canal.  Nares with  edema/erythema of the nasal turbinates with mild clear nasal discharge, patient with mild posterior pharynx/tonsillar arch edema/erythema Neck-supple, no lymphadenopathy Lungs- patient with coarse breath sounds in the right mid back and right lateral midaxillary area consistent with right middle lobe infection.  Patient with some decrease in air movement but no accessory muscle use and patient's breathing is unlabored.  No wheeze. Cardiovascular-regular rate and rhythm Abdomen- mild abdominal distention, slightly hyperactive bowel sounds, soft, patient with some mild generalized lower abdominal discomfort to palpation, no rebound or guarding Back-patient with mild left CVA tenderness     Assessment & Plan:  1. Cough Patient status post recent emergency department visit and patient emergency department discharge paperwork list possible community-acquired pneumonia.  Patient  was placed on Omnicef which patient states she does not believe has helped though patient states that her sputum is now mostly clear.  Patient with abnormal lung sounds on exam in the right middle lobe area.  Patient was asked to obtain a new chest x-ray.  Patient given prescription for Levaquin 500 mg once daily for 7 days as patient also with abnormal urinalysis and possible urinary tract infection.  Prescription also sent to patient's pharmacy for guaifenesin with codeine to help with cough and congestion.  If patient has any worsening of shortness of breath or any concerns, she should go to the emergency department for further evaluation and treatment otherwise, patient was asked to follow-up later this week or early next week with her PCP - DG Chest 2 View; Future - levofloxacin (LEVAQUIN) 500 MG tablet; Take 1 tablet (500 mg total) by mouth daily.  Dispense: 7 tablet; Refill: 0 - guaiFENesin-codeine 100-10 MG/5ML syrup; Take 5 mLs by mouth 3 (three) times daily as needed for up to 10 days for cough.  Dispense: 120 mL; Refill:  0  2. Abnormal urinalysis Patient had an abnormal urinalysis at her recent emergency department visit as urine was cloudy and had multiple bacteria.  Patient had repeat urinalysis at today's visit and follow-up - POCT URINALYSIS DIP (CLINITEK)  3. Pneumonia of right middle lobe due to infectious organism [facility]) Patient with recent diagnosis of right middle lobe pneumonia and patient was placed on Omnicef but patient reports that she continues to have a cough and production of mucus that this has gotten lighter in color.  Patient does have abnormal lung exam at today's visit.  Patient will be placed on Levaquin 500 mg once daily as patient also with abnormal urinalysis that likely represents urinary tract infection and antibiotic chosen that would cover both pneumonia and UTI.  Order was placed for patient to obtain a new chest x-ray to see if there has been any worsening of her pneumonia or improvement in pneumonia.  Patient encouraged to rest and remain well-hydrated and prescription was also provided for cough medication - levofloxacin (LEVAQUIN) 500 MG tablet; Take 1 tablet (500 mg total) by mouth daily.  Dispense: 7 tablet; Refill: 0 - guaiFENesin-codeine 100-10 MG/5ML syrup; Take 5 mLs by mouth 3 (three) times daily as needed for up to 10 days for cough.  Dispense: 120 mL; Refill: 0 - CBC with Differential  4. Leukocytes in urine Patient with the presence of leukocytes in the urine on today's urinalysis.  Prescription provided for Levaquin while urine culture is pending and patient will have CBC to look for elevated white blood cell count as patient also with pneumonia - levofloxacin (LEVAQUIN) 500 MG tablet; Take 1 tablet (500 mg total) by mouth daily.  Dispense: 7 tablet; Refill: 0 - Urine Culture - CBC with Differential  5. Diarrhea, unspecified type Patient with complaint of continued diarrhea.  Patient is encouraged to take over-the-counter Imodium but instead of taking 2 pills initially,  patient is encouraged to take at least 1 pill and then repeat if needed with unformed stools.  Patient with reluctance to take the Imodium due to prior onset of constipation after use but discussed with the patient that she is at risk for further dehydration and electrolyte abnormality if she does not treat her current diarrhea.  Patient did have recent hypokalemia during her ED visit and patient will have BMP at today's visit - Basic Metabolic Panel  6. Dehydration Patient with complaint of continued diarrhea  and likely has some dehydration.  Patient will have BMP done in follow-up.  Patient is encouraged to remain hydrated with water or water and Gatorade max but to avoid fruit juice which may worsen diarrhea. - Basic Metabolic Panel  7. Hearing loss due to cerumen impaction, bilateral Patient with difficulty hearing at today's visit and patient with evidence of bilateral impacted cerumen.  Patient will be referred to ENT for further evaluation and removal of impacted cerumen - Ambulatory referral to ENT  An After Visit Summary was printed and given to the patient.  Return in about 3 days (around 09/06/2018) for later this week or early next week with Dr. Margarita Rana ; ED if worse.

## 2018-09-04 LAB — BASIC METABOLIC PANEL WITH GFR
BUN/Creatinine Ratio: 18 (ref 9–23)
BUN: 13 mg/dL (ref 6–24)
CO2: 22 mmol/L (ref 20–29)
Calcium: 9.7 mg/dL (ref 8.7–10.2)
Chloride: 105 mmol/L (ref 96–106)
Creatinine, Ser: 0.73 mg/dL (ref 0.57–1.00)
GFR calc Af Amer: 112 mL/min/1.73
GFR calc non Af Amer: 97 mL/min/1.73
Glucose: 92 mg/dL (ref 65–99)
Potassium: 4 mmol/L (ref 3.5–5.2)
Sodium: 144 mmol/L (ref 134–144)

## 2018-09-04 LAB — CBC WITH DIFFERENTIAL/PLATELET
Basophils Absolute: 0 x10E3/uL (ref 0.0–0.2)
Basos: 0 %
EOS (ABSOLUTE): 0.1 x10E3/uL (ref 0.0–0.4)
Eos: 1 %
Hematocrit: 36.5 % (ref 34.0–46.6)
Hemoglobin: 11.7 g/dL (ref 11.1–15.9)
Immature Grans (Abs): 0 x10E3/uL (ref 0.0–0.1)
Immature Granulocytes: 0 %
Lymphocytes Absolute: 3.1 x10E3/uL (ref 0.7–3.1)
Lymphs: 47 %
MCH: 28.1 pg (ref 26.6–33.0)
MCHC: 32.1 g/dL (ref 31.5–35.7)
MCV: 88 fL (ref 79–97)
Monocytes Absolute: 0.4 x10E3/uL (ref 0.1–0.9)
Monocytes: 6 %
Neutrophils Absolute: 3.1 x10E3/uL (ref 1.4–7.0)
Neutrophils: 46 %
Platelets: 316 x10E3/uL (ref 150–450)
RBC: 4.16 x10E6/uL (ref 3.77–5.28)
RDW: 14.7 % (ref 12.3–15.4)
WBC: 6.8 x10E3/uL (ref 3.4–10.8)

## 2018-09-05 LAB — URINE CULTURE

## 2018-09-11 ENCOUNTER — Ambulatory Visit: Payer: Medicare Other | Attending: Family Medicine | Admitting: Physician Assistant

## 2018-09-11 VITALS — BP 101/71 | HR 91 | Temp 97.9°F | Resp 16 | Ht 63.0 in | Wt 114.4 lb

## 2018-09-11 DIAGNOSIS — Z79899 Other long term (current) drug therapy: Secondary | ICD-10-CM | POA: Diagnosis not present

## 2018-09-11 DIAGNOSIS — Z880 Allergy status to penicillin: Secondary | ICD-10-CM | POA: Diagnosis not present

## 2018-09-11 DIAGNOSIS — B379 Candidiasis, unspecified: Secondary | ICD-10-CM

## 2018-09-11 DIAGNOSIS — F329 Major depressive disorder, single episode, unspecified: Secondary | ICD-10-CM | POA: Insufficient documentation

## 2018-09-11 DIAGNOSIS — G4709 Other insomnia: Secondary | ICD-10-CM | POA: Diagnosis not present

## 2018-09-11 DIAGNOSIS — B373 Candidiasis of vulva and vagina: Secondary | ICD-10-CM | POA: Diagnosis not present

## 2018-09-11 DIAGNOSIS — E876 Hypokalemia: Secondary | ICD-10-CM | POA: Diagnosis not present

## 2018-09-11 DIAGNOSIS — E559 Vitamin D deficiency, unspecified: Secondary | ICD-10-CM | POA: Insufficient documentation

## 2018-09-11 DIAGNOSIS — Z950 Presence of cardiac pacemaker: Secondary | ICD-10-CM | POA: Insufficient documentation

## 2018-09-11 DIAGNOSIS — R630 Anorexia: Secondary | ICD-10-CM | POA: Insufficient documentation

## 2018-09-11 DIAGNOSIS — G4700 Insomnia, unspecified: Secondary | ICD-10-CM

## 2018-09-11 MED ORDER — FLUCONAZOLE 150 MG PO TABS: 150.0000 mg | ORAL_TABLET | Freq: Once | ORAL | 0 refills | Status: AC

## 2018-09-11 NOTE — Patient Instructions (Signed)
Drink 80 ounces water daily.  Try melatonin to help with sleep.  You can take 5mg  about 1 hour prior to bedtime.  Influenza (Flu) Vaccine (Inactivated or Recombinant): What You Need to Know 1. Why get vaccinated? Influenza vaccine can prevent influenza (flu). Flu is a contagious disease that spreads around the [place] every year, usually between October and May. Anyone can get the flu, but it is more dangerous for some people. Infants and young children, people 68 years of age and older, pregnant women, and people with certain health conditions or a weakened immune system are at greatest risk of flu complications. Pneumonia, bronchitis, sinus infections and ear infections are examples of flu-related complications. If you have a medical condition, such as heart disease, cancer or diabetes, flu can make it worse. Flu can cause fever and chills, sore throat, muscle aches, fatigue, cough, headache, and runny or stuffy nose. Some people may have vomiting and diarrhea, though this is more common in children than adults. Each year thousands of people in the [place] die from flu, and many more are hospitalized. Flu vaccine prevents millions of illnesses and flu-related visits to the doctor each year. 2. Influenza vaccine CDC recommends everyone 13 months of age and older get vaccinated every flu season. Children 6 months through 26 years of age may need 2 doses during a single flu season. Everyone else needs only 1 dose each flu season. It takes about 2 weeks for protection to develop after vaccination. There are many flu viruses, and they are always changing. Each year a new flu vaccine is made to protect against three or four viruses that are likely to cause disease in the upcoming flu season. Even when the vaccine doesn't exactly match these viruses, it may still provide some protection. Influenza vaccine does not cause flu. Influenza vaccine may be given at the same time as other vaccines. 3.  Talk with your health care provider Tell your vaccine provider if the person getting the vaccine:  Has had an allergic reaction after a previous dose of influenza vaccine, or has any severe, life-threatening allergies.  Has ever had Guillain-Barr Syndrome (also called GBS). In some cases, your health care provider may decide to postpone influenza vaccination to a future visit. People with minor illnesses, such as a cold, may be vaccinated. People who are moderately or severely ill should usually wait until they recover before getting influenza vaccine. Your health care provider can give you more information. 4. Risks of a vaccine reaction  Soreness, redness, and swelling where shot is given, fever, muscle aches, and headache can happen after influenza vaccine.  There may be a very small increased risk of Guillain-Barr Syndrome (GBS) after inactivated influenza vaccine (the flu shot). Young children who get the flu shot along with pneumococcal vaccine (PCV13), and/or DTaP vaccine at the same time might be slightly more likely to have a seizure caused by fever. Tell your health care provider if a child who is getting flu vaccine has ever had a seizure. People sometimes faint after medical procedures, including vaccination. Tell your provider if you feel dizzy or have vision changes or ringing in the ears. As with any medicine, there is a very remote chance of a vaccine causing a severe allergic reaction, other serious injury, or death. 5. What if there is a serious problem? An allergic reaction could occur after the vaccinated person leaves the clinic. If you see signs of a severe allergic reaction (hives, swelling of the face  and throat, difficulty breathing, a fast heartbeat, dizziness, or weakness), call 9-1-1 and get the person to the nearest hospital. For other signs that concern you, call your health care provider. Adverse reactions should be reported to the Vaccine Adverse Event Reporting  System (VAERS). Your health care provider will usually file this report, or you can do it yourself. Visit the VAERS website at www.vaers.SamedayNews.es or call 6072068692.VAERS is only for reporting reactions, and VAERS staff do not give medical advice. 6. The National Vaccine Injury Compensation Program The [employer] Vaccine Injury Compensation Program (VICP) is a federal program that was created to compensate people who may have been injured by certain vaccines. Visit the VICP website at GoldCloset.com.ee or call 820-361-3871 to learn about the program and about filing a claim. There is a time limit to file a claim for compensation. 7. How can I learn more?  Ask your healthcare provider.  Call your local or state health department.  Contact the Centers for Disease Control and Prevention (CDC): ? Call 973-295-2291 (1-800-CDC-INFO) or ? Visit CDC's https://gibson.com/ Vaccine Information Statement (Interim) Inactivated Influenza Vaccine (04/18/2018) This information is not intended to replace advice given to you by your health care provider. Make sure you discuss any questions you have with your health care provider. Document Released: 06/15/2006 Document Revised: 04/22/2018 Document Reviewed: 04/22/2018 Elsevier Interactive Patient Education  2019 [employer].

## 2018-09-11 NOTE — Progress Notes (Signed)
Patient ID: Cynthia Underwood, female   DOB: Jun 21, 1971, 48 y.o.   MRN: [id number]       Cynthia Underwood, is a 48 y.o. female  HKV:[id number]  FIE:[id number]  DOB - 03-May-1971  Subjective:  Chief Complaint and HPI: Cynthia Underwood is a 48 y.o. female here today After being seen in the ED 08/27/2018 with dry cough, nausea, vomiting, and diarrhea.  CXR showed early lingular infiltrate.  She was treated with zofran, loperamide, and duoneb, cefdinir.  At her f/up here 09/03/2018, she was given Levaquin.  Today she c/o poor appetite for several months.  Her weight over the last couple of years has fluctuated from 98-136 pounds.  Today, she is 114.  Seems to have worsened since recent illness.  Completed antibiotics.  Also admits to poor water intake.  Just doesn't feel hungry.  Cough is mostly gone.  No fevers.  No vomiting or diarrhea.  Occasional nausea.   Also c/o poor sleep.  She has trouble going to sleep and staying asleep.  She hasn't taken any OTC meds.    Also has vaginal itching s/p heavy antibiotics.  No pelvic pain  ED/Hospital notes and last office note reviewed.     ROS:   Constitutional:  No f/c, No night sweats EENT:  No vision changes, No blurry vision, No hearing changes. No mouth, throat, or ear problems.  Respiratory: much improved cough, No SOB Cardiac: No CP, no palpitations GI:  No abd pain, Occasional N, no V/D. GU: No Urinary s/sx Musculoskeletal: No joint pain Neuro: No headache, no dizziness, no motor weakness.  Skin: No rash Endocrine:  No polydipsia. No polyuria.  Psych: Denies SI/HI  No problems updated.  ALLERGIES: Allergies  Allergen Reactions  . Penicillins Nausea And Vomiting, Rash and Other (See Comments)    Fever Has patient had a PCN reaction causing immediate rash, facial/tongue/throat swelling, SOB or lightheadedness with hypotension: Yes Has patient had a PCN reaction causing severe rash involving mucus membranes or skin necrosis:  Unknown Has patient had a PCN reaction that required hospitalization: No Has patient had a PCN reaction occurring within the last 10 years: No If all of the above answers are "NO", then may proceed with Cephalosporin use.     PAST MEDICAL HISTORY: Past Medical History:  Diagnosis Date  . Anemia   . Bifascicular block 10/02/2014  . Depression   . Excess ear wax   . 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 Presence of permanent cardiac pacemaker   . 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 Sickle cell trait ([place])   . Watery eyes    Left  . Weakness of both legs     MEDICATIONS AT HOME: Prior to Admission medications   Medication Sig Start Date End Date Taking? Authorizing Provider  albuterol (PROVENTIL HFA;VENTOLIN HFA) 108 (90 Base) MCG/ACT inhaler Inhale 2 puffs into the lungs every 4 (four) hours as needed for wheezing or shortness of breath (or coughing). 16/60/63   Delora Fuel, MD  atorvastatin (LIPITOR) 20 MG tablet TAKE 1 TABLET BY MOUTH EVERY DAY 04/19/18   Charlott Rakes, MD  fluconazole (DIFLUCAN) 150 MG tablet Take 1 tablet (150 mg total) by mouth once for 1 dose. 09/11/18 09/11/18  Argentina Donovan, PA-C  mirtazapine (REMERON) 7.5 MG tablet Take 1/2 tablet daily at bedtime. 03/04/18   Patel, Arvin Collard K, DO  naproxen (NAPROSYN) 500 MG tablet Take 1  tablet (500 mg total) by mouth every 12 (twelve) hours as needed for moderate pain. 12/14/17   Patel, Donika K, DO  ondansetron (ZOFRAN) 4 MG tablet Take 1 tablet (4 mg total) by mouth every 6 (six) hours as needed for nausea or vomiting. 77/11/65   Delora Fuel, MD     Objective:  EXAM:   Vitals:   09/11/18 1039  BP: 101/71  Pulse: 91  Resp: 16  Temp: 97.9 F (36.6 C)  TempSrc: Oral  SpO2: 94%  Weight: 114 lb 6.4 oz (51.9 kg)  Height: 5\' 3"  (1.6 m)    General appearance : A&OX3. NAD.  Thin and   Non-toxic-appearing HEENT: Atraumatic and  Normocephalic.  PERRLA. EOM intact.  TM clear B. Mouth-MMM, post pharynx WNL w/o erythema, No PND. Neck: supple, no JVD. No cervical lymphadenopathy. No thyromegaly Chest/Lungs:  Breathing-non-labored, Good air entry bilaterally, breath sounds normal without rales, rhonchi, or wheezing  CVS: S1 S2 regular, no murmurs, gallops, rubs  Extremities: Bilateral Lower Ext shows no edema, both legs are warm to touch with = pulse throughout Neurology:  CN II-XII grossly intact, Non focal.   Psych:  TP linear. J/I fair. Mild speech impediment and speaks softly. poor eye contact and flat affect.  Skin:  No Rash  Data Review Lab Results  Component Value Date   HGBA1C 5.7 01/09/2017   HGBA1C 5.3 07/09/2015   HGBA1C 5.6 08/26/2014     Assessment & Plan   1. Insomnia, unspecified type Can try 5mg  melatonin 1 hr prior to bedtime.    2. Poor appetite Advised frequent nutritious foods whether she feels hungry or not.   - TSH - CBC with Differential/Platelet - Comprehensive metabolic panel - Vitamin D, 25-hydroxy  3. Yeast infection Diflucan 150mg  sent  4. Vitamin D deficiency - Vitamin D, 25-hydroxy  5. Hypokalemia - Comprehensive metabolic panel   Patient have been counseled extensively about nutrition and exercise  Return in about 1 month (around 10/12/2018) for Dr Margarita Rana; f/up appetite and insomnia.  The patient was given clear instructions to go to ER or return to medical center if symptoms don't improve, worsen or new problems develop. The patient verbalized understanding. The patient was told to call to get lab results if they haven't heard anything in the next week.     Freeman Caldron, PA-C [facility] and [facility] [place], [place]   09/11/2018, 10:57 AM

## 2018-09-12 DIAGNOSIS — J3489 Other specified disorders of nose and nasal sinuses: Secondary | ICD-10-CM | POA: Diagnosis not present

## 2018-09-12 DIAGNOSIS — H6123 Impacted cerumen, bilateral: Secondary | ICD-10-CM | POA: Diagnosis not present

## 2018-09-12 DIAGNOSIS — Z972 Presence of dental prosthetic device (complete) (partial): Secondary | ICD-10-CM | POA: Diagnosis not present

## 2018-09-12 LAB — CBC WITH DIFFERENTIAL/PLATELET
BASOS: 1 %
Basophils Absolute: 0 10*3/uL (ref 0.0–0.2)
EOS (ABSOLUTE): 0.1 10*3/uL (ref 0.0–0.4)
EOS: 1 %
HEMATOCRIT: 38.2 % (ref 34.0–46.6)
HEMOGLOBIN: 12.6 g/dL (ref 11.1–15.9)
Immature Grans (Abs): 0 10*3/uL (ref 0.0–0.1)
Immature Granulocytes: 0 %
LYMPHS ABS: 2.3 10*3/uL (ref 0.7–3.1)
Lymphs: 37 %
MCH: 29.2 pg (ref 26.6–33.0)
MCHC: 33 g/dL (ref 31.5–35.7)
MCV: 88 fL (ref 79–97)
MONOCYTES: 7 %
MONOS ABS: 0.5 10*3/uL (ref 0.1–0.9)
Neutrophils Absolute: 3.4 10*3/uL (ref 1.4–7.0)
Neutrophils: 54 %
Platelets: 288 10*3/uL (ref 150–450)
RBC: 4.32 x10E6/uL (ref 3.77–5.28)
RDW: 14.7 % (ref 11.7–15.4)
WBC: 6.3 10*3/uL (ref 3.4–10.8)

## 2018-09-12 LAB — COMPREHENSIVE METABOLIC PANEL
ALT: 8 IU/L (ref 0–32)
AST: 19 IU/L (ref 0–40)
Albumin/Globulin Ratio: 1.1 — ABNORMAL LOW (ref 1.2–2.2)
Albumin: 4 g/dL (ref 3.5–5.5)
Alkaline Phosphatase: 65 IU/L (ref 39–117)
BUN/Creatinine Ratio: 17 (ref 9–23)
BUN: 15 mg/dL (ref 6–24)
Bilirubin Total: 0.6 mg/dL (ref 0.0–1.2)
CALCIUM: 9.9 mg/dL (ref 8.7–10.2)
CO2: 22 mmol/L (ref 20–29)
CREATININE: 0.86 mg/dL (ref 0.57–1.00)
Chloride: 103 mmol/L (ref 96–106)
GFR, EST AFRICAN AMERICAN: 92 mL/min/{1.73_m2} (ref >59)
GFR, EST NON AFRICAN AMERICAN: 80 mL/min/{1.73_m2} (ref >59)
GLOBULIN, TOTAL: 3.5 g/dL (ref 1.5–4.5)
Glucose: 85 mg/dL (ref 65–99)
Potassium: 4 mmol/L (ref 3.5–5.2)
SODIUM: 144 mmol/L (ref 134–144)
TOTAL PROTEIN: 7.5 g/dL (ref 6.0–8.5)

## 2018-09-12 LAB — TSH: TSH: 1.5 u[IU]/mL (ref 0.450–4.500)

## 2018-09-12 LAB — VITAMIN D 25 HYDROXY (VIT D DEFICIENCY, FRACTURES): Vit D, 25-Hydroxy: 4.3 ng/mL — ABNORMAL LOW (ref 30.0–100.0)

## 2018-09-13 ENCOUNTER — Other Ambulatory Visit: Payer: Self-pay | Admitting: Physician Assistant

## 2018-09-13 MED ORDER — VITAMIN D (ERGOCALCIFEROL) 1.25 MG (50000 UNIT) PO CAPS: 50000.0000 [IU] | ORAL_CAPSULE | ORAL | 0 refills | Status: DC

## 2018-09-24 ENCOUNTER — Telehealth: Payer: Self-pay

## 2018-09-24 NOTE — Telephone Encounter (Signed)
Contacted pt to go over lab results pt didn't answer lvm asking pt to give me a call at her earliest convenience  

## 2018-09-24 NOTE — Telephone Encounter (Signed)
Pt returned call and made aware of lab results pt doesn't have any questions or concerns

## 2018-09-29 LAB — CUP PACEART REMOTE DEVICE CHECK
Battery Voltage: 3.03 V
Brady Statistic AP VP Percent: 6.71 %
Date Time Interrogation Session: 20191216113047
Implantable Lead Location: 753859
Implantable Lead Model: 3830
Implantable Lead Model: 5076
Implantable Pulse Generator Implant Date: 20180614
Lead Channel Impedance Value: 285 Ohm
Lead Channel Pacing Threshold Amplitude: 0.625 V
Lead Channel Pacing Threshold Pulse Width: 0.4 ms
Lead Channel Pacing Threshold Pulse Width: 0.4 ms
Lead Channel Sensing Intrinsic Amplitude: 3 mV
Lead Channel Sensing Intrinsic Amplitude: 6.25 mV
Lead Channel Sensing Intrinsic Amplitude: 6.25 mV
Lead Channel Setting Pacing Amplitude: 2 V
Lead Channel Setting Pacing Pulse Width: 1 ms
MDC IDC LEAD IMPLANT DT: 20180614
MDC IDC LEAD IMPLANT DT: 20180614
MDC IDC LEAD LOCATION: 753859
MDC IDC MSMT BATTERY REMAINING LONGEVITY: 126 mo
MDC IDC MSMT LEADCHNL RA IMPEDANCE VALUE: 342 Ohm
MDC IDC MSMT LEADCHNL RA SENSING INTR AMPL: 3 mV
MDC IDC MSMT LEADCHNL RV IMPEDANCE VALUE: 513 Ohm
MDC IDC MSMT LEADCHNL RV IMPEDANCE VALUE: 627 Ohm
MDC IDC MSMT LEADCHNL RV PACING THRESHOLD AMPLITUDE: 1.25 V
MDC IDC SET LEADCHNL RV PACING AMPLITUDE: 2 V
MDC IDC SET LEADCHNL RV SENSING SENSITIVITY: 2 mV
MDC IDC STAT BRADY AP VS PERCENT: 30.64 %
MDC IDC STAT BRADY AS VP PERCENT: 44.13 %
MDC IDC STAT BRADY AS VS PERCENT: 18.51 %
MDC IDC STAT BRADY RA PERCENT PACED: 37.22 %
MDC IDC STAT BRADY RV PERCENT PACED: 50.85 %

## 2018-10-02 ENCOUNTER — Telehealth: Payer: Self-pay | Admitting: *Deleted

## 2018-10-02 NOTE — Telephone Encounter (Signed)
-----   Message from Antony Blackbird, MD sent at 09/04/2018  4:46 PM EST ----- Notify patient that her CBC and BMP were normal on her recent labs

## 2018-10-02 NOTE — Telephone Encounter (Signed)
Patient verified DOB ?Patient is aware of labs being normal. ? ?

## 2018-10-08 ENCOUNTER — Telehealth: Payer: Self-pay

## 2018-10-09 NOTE — Telephone Encounter (Signed)
Error

## 2018-10-21 ENCOUNTER — Ambulatory Visit: Payer: Medicare Other | Admitting: Family Medicine

## 2018-10-26 ENCOUNTER — Other Ambulatory Visit: Payer: Self-pay | Admitting: Family Medicine

## 2018-10-29 ENCOUNTER — Ambulatory Visit (INDEPENDENT_AMBULATORY_CARE_PROVIDER_SITE_OTHER): Payer: Medicare Other | Admitting: Cardiology

## 2018-10-29 ENCOUNTER — Encounter: Payer: Self-pay | Admitting: Cardiology

## 2018-10-29 ENCOUNTER — Encounter (INDEPENDENT_AMBULATORY_CARE_PROVIDER_SITE_OTHER): Payer: Self-pay

## 2018-10-29 VITALS — BP 101/73 | HR 87 | Ht 63.0 in | Wt 120.8 lb

## 2018-10-29 DIAGNOSIS — I452 Bifascicular block: Secondary | ICD-10-CM | POA: Diagnosis not present

## 2018-10-29 DIAGNOSIS — I4510 Unspecified right bundle-branch block: Secondary | ICD-10-CM

## 2018-10-29 DIAGNOSIS — I441 Atrioventricular block, second degree: Secondary | ICD-10-CM

## 2018-10-29 DIAGNOSIS — R0602 Shortness of breath: Secondary | ICD-10-CM

## 2018-10-29 NOTE — Patient Instructions (Addendum)
Medication Instructions:  Your physician recommends that you continue on your current medications as directed. Please refer to the Current Medication list given to you today.  If you need a refill on your cardiac medications before your next appointment, please call your pharmacy.   Lab work: None If you have labs (blood work) drawn today and your tests are completely normal, you will receive your results only by: Marland Kitchen MyChart Message (if you have MyChart) OR . A paper copy in the mail If you have any lab test that is abnormal or we need to change your treatment, we will call you to review the results.  Testing/Procedures: None  Follow-Up: At [facility], you and your health needs are our priority.  As part of our continuing mission to provide you with exceptional heart care, we have created designated Provider Care Teams.  These Care Teams include your primary Cardiologist (physician) and Advanced Practice Providers (APPs -  Physician Assistants and Nurse Practitioners) who all work together to provide you with the care you need, when you need it. You will need a follow up appointment in 1 years.  Please call our office 2 months in advance to schedule this appointment.  You may see Dr. Radford Pax or one of the following Advanced Practice Providers on your designated Care Team:   [place], PA-C Melina Copa, PA-C . Ermalinda Barrios, PA-C  Your physician recommends that you follow up with Pulmonology for SOB.

## 2018-10-29 NOTE — Progress Notes (Signed)
Cardiology Office Note:    Date:  10/29/2018   ID:  Cynthia Underwood, DOB [birth year], MRN [id number]  PCP:  Charlott Rakes, MD  Cardiologist:  No primary care provider on file.    Referring MD: Charlott Rakes, MD   Chief Complaint  Patient presents with  . Follow-up    Myotonic dystrophy, heart block s/p PPM    History of Present Illness:    Cynthia Underwood is a 48 y.o. female with a hx of myotonic dystrophy Type I since age 71 with intermittent jaw locking, finger stiffness and difficulty relaxing muscles.She was diagnosed by genetic testing. She has a family history of myotonic dystrophy and sarcoidosis with her Dad having a cardiac transplant. She is followed with Neurology. She has been followed with yearly echo. Cardiac MRI showed myocardial thinning associated with hypokinesis and subendocardial late gadolinium enhancement in the mid inferior and inferolateral wallsthat was felt torepresent either prior non-transmural infarct (low probability in this young female), cardiac sarcoidosis (subendocardial LGE is rare, more frequently there is midwall or subepicardial involvement). FDG-PET scan which showed no evidence of myocardial FDG uptake indicating that there was no evidence of sarcoidosis and the abnormality noted on cardiac MRI was likely due to myotonic dystrophy.    She has a history of RBBB and LAFB and tested positive for DM1 which increases risk of cardiac conduction disease and SCD.  She was referred to EP and found to have mobitz type 2 AV block and underwent MDT dual chamber HIS bundle pacer with septal capture and is followed by Dr. Caryl Comes.  She was referred to Pulmonary and felt not to have asthma or lung tissue disease by high resolution Chest Ct.  It was felt that her SOB was likely due to neuromuscular disease and was placed on BiPAP due to hypercarbia.  Since she started the BiPAP her SOB has improved.    Past Medical History:  Diagnosis Date  . Anemia   .  Bifascicular block 10/02/2014  . Depression   . Excess ear wax   . 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 Presence of permanent cardiac pacemaker   . 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 Sickle cell trait ([place])   . Watery eyes    Left  . Weakness of both legs     Past Surgical History:  Procedure Laterality Date  . BREAST BIOPSY Right   . EYE SURGERY Left 2017   "related to blockage in my nose"  . INSERT / REPLACE / REMOVE PACEMAKER  02/15/2017  . PACEMAKER IMPLANT N/A 02/15/2017   Procedure: Pacemaker Implant;  Surgeon: Deboraha Sprang, MD;  Location: [facility];  Service: Cardiovascular;  Laterality: N/A;    Current Medications: Current Meds  Medication Sig  . albuterol (PROVENTIL HFA;VENTOLIN HFA) 108 (90 Base) MCG/ACT inhaler Inhale 2 puffs into the lungs every 4 (four) hours as needed for wheezing or shortness of breath (or coughing).  Marland Kitchen atorvastatin (LIPITOR) 20 MG tablet TAKE 1 TABLET BY MOUTH EVERY DAY  . mirtazapine (REMERON) 7.5 MG tablet Take 1/2 tablet daily at bedtime.  . naproxen (NAPROSYN) 500 MG tablet Take 1 tablet (500 mg total) by mouth every 12 (twelve) hours as needed for moderate pain.  . Vitamin D, Ergocalciferol, (DRISDOL) 1.25 MG (50000 UT) CAPS capsule Take 1 capsule (50,000 Units total) by mouth every 7 (seven) days.     Allergies:  Penicillin g and Penicillins   Social History   Socioeconomic History  . Marital status: Single    Spouse name: Not on file  . Number of children: 0  . Years of education: 1.5 Collge  . Highest education level: Not on file  Occupational History  . Occupation: Unemployed    Employer: OTHER  Social Needs  . Financial resource strain: Not on file  . Food insecurity:    Worry: Not on file    Inability: Not on file  . Transportation needs:    Medical: Not on file    Non-medical: Not on file  Tobacco Use  . Smoking status: Former  Smoker    Packs/day: 0.00    Years: 28.00    Pack years: 0.00    Types: Cigarettes    Last attempt to quit: 10/04/2016    Years since quitting: 2.0  . Smokeless tobacco: Never Used  Substance and Sexual Activity  . Alcohol use: Yes    Alcohol/week: 0.0 standard drinks    Comment: 02/15/2017 "maybe 6 times/year"  . Drug use: Yes    Frequency: 2.0 times per week    Types: Marijuana    Comment: 02/15/2017 "couple times/month"  . Sexual activity: Yes    Birth control/protection: None, Condom  Lifestyle  . Physical activity:    Days per week: Not on file    Minutes per session: Not on file  . Stress: Not on file  Relationships  . Social connections:    Talks on phone: Not on file    Gets together: Not on file    Attends religious service: Not on file    Active member of club or organization: Not on file    Attends meetings of clubs or organizations: Not on file    Relationship status: Not on file  Other Topics Concern  . Not on file  Social History Narrative   Patient lives at home with her aunt.   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  She moved to [facility] in August 2015.   She has been on disability since 2010.   Education: 1 1/2 years of college.     Family History: The patient's family history includes COPD in her mother; Cancer (age of onset: 15) in her father; Diabetes in her maternal uncle and paternal grandmother; Heart disease in her father, maternal uncle, and paternal grandmother; Hypertension in her paternal grandmother; Neuromuscular disorder in her cousin, maternal aunt, and sister.  ROS:   Please see the history of present illness.    ROS  All other systems reviewed and negative.   EKGs/Labs/Other Studies Reviewed:    The following studies were reviewed today: none  EKG:  EKG is  ordered today.  The ekg ordered today demonstrates atrial sensed ventricular paced rhythm  Recent Labs: 09/11/2018: ALT 8; BUN 15; Creatinine, Ser 0.86;  Hemoglobin 12.6; Platelets 288; Potassium 4.0; Sodium 144; TSH 1.500   Recent Lipid Panel    Component Value Date/Time   CHOL 214 (H) 01/10/2018 0847   TRIG 113 01/10/2018 0847   HDL 57 01/10/2018 0847   CHOLHDL 3.8 01/10/2018 0847   CHOLHDL 3.5 08/26/2014 1516   VLDL 20 08/26/2014 1516   LDLCALC 134 (H) 01/10/2018 0847    Physical Exam:    VS:  BP 101/73   Pulse 87   Ht 5\' 3"  (1.6 m)   Wt 120 lb 12.8 oz (54.8 kg)   BMI 21.40 kg/m  Wt Readings from Last 3 Encounters:  10/29/18 120 lb 12.8 oz (54.8 kg)  09/11/18 114 lb 6.4 oz (51.9 kg)  09/03/18 117 lb (53.1 kg)     GEN:  Well nourished, well developed in no acute distress HEENT: Normal NECK: No JVD; No carotid bruits LYMPHATICS: No lymphadenopathy CARDIAC: RRR, no murmurs, rubs, gallops RESPIRATORY:  Clear to auscultation without rales, wheezing or rhonchi  ABDOMEN: Soft, non-tender, non-distended MUSCULOSKELETAL:  No edema; No deformity  SKIN: Warm and dry NEUROLOGIC:  Alert and oriented x 3 PSYCHIATRIC:  Normal affect   ASSESSMENT:    1. RBBB   2. RBBB (right 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)   3. Mobitz type 2 second degree heart block   4. SOB (shortness of breath)    PLAN:    In order of problems listed above:  1.  Myotonic dystrophy - cardiac MRI showed myocardial thinning associated with hypokinesis and subendocardial late gadolinium enhancement in the mid inferior and inferolateral walls and cardiac FDG-PET scan at [facility] was normal and therefore her cardiac MRI findings were more likely to represent myotonic dystrophy than sarcoidosis. Ef was normal by echo 12/2016.  Repeat 2D echo 05/24/2018 showed normal LV function with EF 55% and grade 1 diastolic dysfunction.  There was mild RV dysfunction felt likely related to chronic V pacing.  She has a repeat echo pending next month to reassess RV function.  2.  RBBB with LAFB and high grade 2nd degree AV block s/p MDT dual chamber (HIS  bundle) PPM - she is followed in device clinic and is atrial sensed ventricular paced today.  3.  SOB - this was felt in the past to be due to underlying pulmonary etiology as her cardiac workup including MRI and echo showed normal LVF.  She was referred to Pulmonary and felt not to have asthma or lung tissue disease by high resolution Chest Ct.  It was felt that her SOB was likely due to neuromuscular disease and was placed on BiPAP due to hypercarbia.  She did not tolerate BiPAP and is no longer using it.  She has not seen pulmonary in some time.  I will get her back into see pulmonary.   Medication Adjustments/Labs and Tests Ordered: Current medicines are reviewed at length with the patient today.  Concerns regarding medicines are outlined above.  Orders Placed This Encounter  Procedures  . EKG 12-Lead   No orders of the defined types were placed in this encounter.   Signed, Fransico Him, MD  10/29/2018 3:10 PM    [place]

## 2018-11-05 DIAGNOSIS — H25041 Posterior subcapsular polar age-related cataract, right eye: Secondary | ICD-10-CM | POA: Diagnosis not present

## 2018-11-05 DIAGNOSIS — Z961 Presence of intraocular lens: Secondary | ICD-10-CM | POA: Diagnosis not present

## 2018-11-05 DIAGNOSIS — H5203 Hypermetropia, bilateral: Secondary | ICD-10-CM | POA: Diagnosis not present

## 2018-11-07 ENCOUNTER — Ambulatory Visit: Payer: Medicare Other | Attending: Family Medicine | Admitting: Family Medicine

## 2018-11-07 ENCOUNTER — Other Ambulatory Visit: Payer: Self-pay

## 2018-11-07 VITALS — BP 91/60 | HR 69 | Temp 97.4°F | Ht 63.0 in | Wt 122.4 lb

## 2018-11-07 DIAGNOSIS — Z950 Presence of cardiac pacemaker: Secondary | ICD-10-CM | POA: Diagnosis not present

## 2018-11-07 DIAGNOSIS — Z880 Allergy status to penicillin: Secondary | ICD-10-CM | POA: Insufficient documentation

## 2018-11-07 DIAGNOSIS — G4709 Other insomnia: Secondary | ICD-10-CM | POA: Insufficient documentation

## 2018-11-07 DIAGNOSIS — I452 Bifascicular block: Secondary | ICD-10-CM | POA: Diagnosis not present

## 2018-11-07 DIAGNOSIS — J00 Acute nasopharyngitis [common cold]: Secondary | ICD-10-CM

## 2018-11-07 DIAGNOSIS — Z8249 Family history of ischemic heart disease and other diseases of the circulatory system: Secondary | ICD-10-CM | POA: Insufficient documentation

## 2018-11-07 DIAGNOSIS — Z79899 Other long term (current) drug therapy: Secondary | ICD-10-CM | POA: Diagnosis not present

## 2018-11-07 DIAGNOSIS — I441 Atrioventricular block, second degree: Secondary | ICD-10-CM | POA: Diagnosis not present

## 2018-11-07 DIAGNOSIS — G7111 Myotonic muscular dystrophy: Secondary | ICD-10-CM

## 2018-11-07 DIAGNOSIS — Z833 Family history of diabetes mellitus: Secondary | ICD-10-CM | POA: Diagnosis not present

## 2018-11-07 MED ORDER — CETIRIZINE HCL 10 MG PO TABS: 10.0000 mg | ORAL_TABLET | Freq: Every day | ORAL | 1 refills | Status: DC

## 2018-11-07 MED ORDER — HYDROXYZINE HCL 25 MG PO TABS: 25.0000 mg | ORAL_TABLET | Freq: Every day | ORAL | 3 refills | Status: DC

## 2018-11-08 ENCOUNTER — Encounter: Payer: Self-pay | Admitting: Family Medicine

## 2018-11-08 NOTE — Progress Notes (Signed)
Subjective:  Patient ID: Cynthia Underwood, female    DOB: 03-03-1971  Age: 48 y.o. MRN: [id number]  CC: Leg Pain   HPI Cynthia Underwood  is a 48 year old female with a history of myotonic dystrophy complicated by cardiac conduction abnormalities including second-degree heart s/p pacemaker placement who presents today for a follow-up visit. She complains of insomnia and was placed on Remeron by her neurologist however this made her gain weight.  In the past while in [place] she was on trazodone. She goes to bed at midnight but does not fall asleep until early hours of the morning around 5 AM which she is unhappy about as she loses a huge portion of her day due to sleeping then. She does have bilateral thigh pain which occurs when she has been sitting for prolonged.  And occurs at this time as she has been sitting in the exam room for some time. Endorses dyspnea on moderate exertion.  Previously placed on BiPAP which she was unable to tolerate and this was discontinued.  Last seen by cardiology last month and echocardiogram ordered.  Pacemaker remote device check in 09/2017 revealed subclinical atrial fibrillation.   She has had a nonproductive cough, rhinorrhea, headaches for the last few days but denies the presence of fever.  She is concerned because 3 months ago she was diagnosed with community-acquired pneumonia.  Denies history of sick contact, chest pains, palpitations.  Past Medical History:  Diagnosis Date  . Anemia   . Bifascicular block 10/02/2014  . Depression   . Excess ear wax   . 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 Presence of permanent cardiac pacemaker   . 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 Sickle cell trait ([place])   . Watery eyes    Left  . Weakness of both legs     Past Surgical History:  Procedure Laterality Date  . BREAST BIOPSY Right   . EYE SURGERY Left 2017   "related  to blockage in my nose"  . INSERT / REPLACE / REMOVE PACEMAKER  02/15/2017  . PACEMAKER IMPLANT N/A 02/15/2017   Procedure: Pacemaker Implant;  Surgeon: Deboraha Sprang, MD;  Location: [facility];  Service: Cardiovascular;  Laterality: N/A;    Family History  Problem Relation Age of Onset  . Cancer Father 15       Deceased  . Heart disease Father   . COPD Mother   . Diabetes Maternal Uncle   . Heart disease Maternal Uncle   . Heart disease Paternal Grandmother   . Diabetes Paternal Grandmother   . Hypertension Paternal Grandmother   . Neuromuscular disorder Maternal Aunt        Myotonic dystrophy  . Neuromuscular disorder Cousin        Myotonic dystrophy  . Neuromuscular disorder Sister        Myotonic dystrophy    Allergies  Allergen Reactions  . Penicillin G Other (See Comments), Rash and Nausea And Vomiting    Sore throat  . Penicillins Nausea And Vomiting, Rash and Other (See Comments)    Fever Has patient had a PCN reaction causing immediate rash, facial/tongue/throat swelling, SOB or lightheadedness with hypotension: Yes Has patient had a PCN reaction causing severe rash involving mucus membranes or skin necrosis: Unknown Has patient had a PCN reaction that required hospitalization: No Has patient had a PCN reaction occurring within the last  10 years: No If all of the above answers are "NO", then may proceed with Cephalosporin use.     Outpatient Medications Prior to Visit  Medication Sig Dispense Refill  . albuterol (PROVENTIL HFA;VENTOLIN HFA) 108 (90 Base) MCG/ACT inhaler Inhale 2 puffs into the lungs every 4 (four) hours as needed for wheezing or shortness of breath (or coughing). 1 Inhaler 0  . atorvastatin (LIPITOR) 20 MG tablet TAKE 1 TABLET BY MOUTH EVERY DAY 90 tablet 0  . naproxen (NAPROSYN) 500 MG tablet Take 1 tablet (500 mg total) by mouth every 12 (twelve) hours as needed for moderate pain. 60 tablet 0  . Vitamin D, Ergocalciferol, (DRISDOL) 1.25  MG (50000 UT) CAPS capsule Take 1 capsule (50,000 Units total) by mouth every 7 (seven) days. 16 capsule 0  . mirtazapine (REMERON) 7.5 MG tablet Take 1/2 tablet daily at bedtime. (Patient not taking: Reported on 11/07/2018) 135 tablet 1   No facility-administered medications prior to visit.      ROS Review of Systems  Constitutional: Negative for activity change, appetite change and fatigue.  HENT: Negative for congestion, sinus pressure and sore throat.   Eyes: Negative for visual disturbance.  Respiratory: Positive for cough. Negative for chest tightness, shortness of breath and wheezing.   Cardiovascular: Negative for chest pain and palpitations.  Gastrointestinal: Negative for abdominal distention, abdominal pain and constipation.  Endocrine: Negative for polydipsia.  Genitourinary: Negative for dysuria and frequency.  Musculoskeletal:       See hpi  Skin: Negative for rash.  Neurological: Positive for headaches. Negative for tremors, light-headedness and numbness.  Hematological: Does not bruise/bleed easily.  Psychiatric/Behavioral: Positive for sleep disturbance. Negative for agitation and behavioral problems.    Objective:  BP 91/60   Pulse 69   Temp (!) 97.4 F (36.3 C) (Oral)   Ht 5\' 3"  (1.6 m)   Wt 122 lb 6.4 oz (55.5 kg)   SpO2 99%   BMI 21.68 kg/m   BP/Weight 11/07/2018 7/89/3810 09/10/5100  Systolic BP 91 585 277  Diastolic BP 60 73 71  Wt. (Lbs) 122.4 120.8 114.4  BMI 21.68 21.4 20.27      Physical Exam Constitutional:      Appearance: She is well-developed.  HENT:     Head:     Comments: No sinus tenderness    Right Ear: Tympanic membrane normal.     Left Ear: Tympanic membrane normal.     Mouth/Throat:     Pharynx: Oropharynx is clear.  Cardiovascular:     Rate and Rhythm: Normal rate.     Heart sounds: Normal heart sounds. No murmur.  Pulmonary:     Effort: Pulmonary effort is normal.     Breath sounds: Normal breath sounds. No wheezing or  rales.  Chest:     Chest wall: No tenderness.  Abdominal:     General: Bowel sounds are normal. There is no distension.     Palpations: Abdomen is soft. There is no mass.     Tenderness: There is no abdominal tenderness.  Musculoskeletal: Normal range of motion.  Neurological:     Mental Status: She is alert and oriented to person, place, and time.     CMP Latest Ref Rng & Units 09/11/2018 09/03/2018 08/27/2018  Glucose 65 - 99 mg/dL 85 92 132(H)  BUN 6 - 24 mg/dL 15 13 13   Creatinine 0.57 - 1.00 mg/dL 0.86 0.73 0.76  Sodium 134 - 144 mmol/L 144 144 141  Potassium 3.5 - 5.2  mmol/L 4.0 4.0 3.3(L)  Chloride 96 - 106 mmol/L 103 105 108  CO2 20 - 29 mmol/L 22 22 23   Calcium 8.7 - 10.2 mg/dL 9.9 9.7 8.9  Total Protein 6.0 - 8.5 g/dL 7.5 - 8.1  Total Bilirubin 0.0 - 1.2 mg/dL 0.6 - 0.5  Alkaline Phos 39 - 117 IU/L 65 - 53  AST 0 - 40 IU/L 19 - 29  ALT 0 - 32 IU/L 8 - 15    Lipid Panel     Component Value Date/Time   CHOL 214 (H) 01/10/2018 0847   TRIG 113 01/10/2018 0847   HDL 57 01/10/2018 0847   CHOLHDL 3.8 01/10/2018 0847   CHOLHDL 3.5 08/26/2014 1516   VLDL 20 08/26/2014 1516   LDLCALC 134 (H) 01/10/2018 0847    CBC    Component Value Date/Time   WBC 6.3 09/11/2018 1132   WBC 8.4 08/27/2018 2308   RBC 4.32 09/11/2018 1132   RBC 3.83 (L) 08/27/2018 2308   HGB 12.6 09/11/2018 1132   HCT 38.2 09/11/2018 1132   PLT 288 09/11/2018 1132   MCV 88 09/11/2018 1132   MCH 29.2 09/11/2018 1132   MCH 28.7 08/27/2018 2308   MCHC 33.0 09/11/2018 1132   MCHC 30.7 08/27/2018 2308   RDW 14.7 09/11/2018 1132   LYMPHSABS 2.3 09/11/2018 1132   MONOABS 0.6 08/27/2018 2308   EOSABS 0.1 09/11/2018 1132   BASOSABS 0.0 09/11/2018 1132    Lab Results  Component Value Date   HGBA1C 5.7 01/09/2017    Assessment & Plan:   1. Myotonic dystrophy, type 1 ([place]) She does have slight pains after prolonged sitting OTC analgesics recommended Currently followed by neurology  2. Mobitz  type 2 second degree heart block Status post pacemaker Has upcoming appointment for echocardiogram Closely followed by cardiology  3. Other insomnia Unable to tolerate Remeron due to weight gain Holding off on trazodone due to cardiac conduction abnormalities Trial of hydroxyzine Sleep hygiene discussed - hydrOXYzine (ATARAX/VISTARIL) 25 MG tablet; Take 1 tablet (25 mg total) by mouth at bedtime.  Dispense: 30 tablet; Refill: 3  4. Acute nasopharyngitis No antibiotic indicated at this time but symptomatic management - cetirizine (ZYRTEC) 10 MG tablet; Take 1 tablet (10 mg total) by mouth daily.  Dispense: 30 tablet; Refill: 1   Meds ordered this encounter  Medications  . hydrOXYzine (ATARAX/VISTARIL) 25 MG tablet    Sig: Take 1 tablet (25 mg total) by mouth at bedtime.    Dispense:  30 tablet    Refill:  3  . cetirizine (ZYRTEC) 10 MG tablet    Sig: Take 1 tablet (10 mg total) by mouth daily.    Dispense:  30 tablet    Refill:  1    Follow-up: Return in about 9 months (around 08/09/2019) for complete physical exam.       Charlott Rakes, MD, FAAFP. [facility] [place], [place]   11/08/2018, 9:31 AM

## 2018-11-11 ENCOUNTER — Ambulatory Visit (HOSPITAL_COMMUNITY): Payer: Medicare Other | Attending: Cardiology

## 2018-11-11 DIAGNOSIS — I441 Atrioventricular block, second degree: Secondary | ICD-10-CM | POA: Insufficient documentation

## 2018-11-11 DIAGNOSIS — I452 Bifascicular block: Secondary | ICD-10-CM | POA: Diagnosis not present

## 2018-11-11 DIAGNOSIS — Z950 Presence of cardiac pacemaker: Secondary | ICD-10-CM | POA: Diagnosis not present

## 2018-11-14 ENCOUNTER — Telehealth: Payer: Self-pay

## 2018-11-14 DIAGNOSIS — I4510 Unspecified right bundle-branch block: Secondary | ICD-10-CM

## 2018-11-14 NOTE — Telephone Encounter (Signed)
LMTCB

## 2018-11-14 NOTE — Telephone Encounter (Signed)
-----   Message from Sueanne Margarita, MD sent at 11/11/2018  3:32 PM EDT ----- Please let patient know that echo showed low normal LV function with EF 50 to 55% with mildly stiff heart muscle and mild thickening of the aortic valve.

## 2018-11-18 ENCOUNTER — Ambulatory Visit (INDEPENDENT_AMBULATORY_CARE_PROVIDER_SITE_OTHER): Payer: Medicare Other | Admitting: *Deleted

## 2018-11-18 ENCOUNTER — Other Ambulatory Visit: Payer: Self-pay

## 2018-11-18 DIAGNOSIS — I441 Atrioventricular block, second degree: Secondary | ICD-10-CM

## 2018-11-19 LAB — CUP PACEART REMOTE DEVICE CHECK
Brady Statistic AP VP Percent: 3.64 %
Brady Statistic AP VS Percent: 28.84 %
Brady Statistic AS VP Percent: 36.52 %
Brady Statistic RA Percent Paced: 32.29 %
Brady Statistic RV Percent Paced: 40.21 %
Date Time Interrogation Session: 20200316053439
Implantable Lead Location: 753859
Implantable Lead Location: 753859
Implantable Lead Model: 3830
Lead Channel Impedance Value: 437 Ohm
Lead Channel Impedance Value: 570 Ohm
Lead Channel Pacing Threshold Pulse Width: 0.4 ms
Lead Channel Sensing Intrinsic Amplitude: 3.5 mV
Lead Channel Sensing Intrinsic Amplitude: 5 mV
Lead Channel Sensing Intrinsic Amplitude: 5 mV
Lead Channel Setting Pacing Amplitude: 2 V
Lead Channel Setting Pacing Amplitude: 2 V
Lead Channel Setting Pacing Pulse Width: 1 ms
Lead Channel Setting Sensing Sensitivity: 2 mV
MDC IDC LEAD IMPLANT DT: 20180614
MDC IDC LEAD IMPLANT DT: 20180614
MDC IDC MSMT BATTERY REMAINING LONGEVITY: 122 mo
MDC IDC MSMT BATTERY VOLTAGE: 3.02 V
MDC IDC MSMT LEADCHNL RA IMPEDANCE VALUE: 247 Ohm
MDC IDC MSMT LEADCHNL RA IMPEDANCE VALUE: 304 Ohm
MDC IDC MSMT LEADCHNL RA PACING THRESHOLD AMPLITUDE: 0.75 V
MDC IDC MSMT LEADCHNL RA SENSING INTR AMPL: 3.5 mV
MDC IDC MSMT LEADCHNL RV PACING THRESHOLD AMPLITUDE: 1.5 V
MDC IDC MSMT LEADCHNL RV PACING THRESHOLD PULSEWIDTH: 0.4 ms
MDC IDC PG IMPLANT DT: 20180614
MDC IDC STAT BRADY AS VS PERCENT: 31 %

## 2018-11-21 NOTE — Telephone Encounter (Addendum)
-----   Message from Sueanne Margarita, MD sent at 11/11/2018  3:32 PM EDT ----- Please let patient know that echo showed low normal LV function with EF 50 to 55% with mildly stiff heart muscle and mild thickening of the aortic valve.   Notes recorded by Sueanne Margarita, MD on 11/11/2018 at 3:33 PM EDT The RV has normal function and normal size. Compared to last echo EF is subtly decreased. Please repeat echo in 6 months to reevaluate.

## 2018-11-21 NOTE — Telephone Encounter (Signed)
Spoke with the patient, she expressed understanding about her results. Repeat echo was placed for 6 months.

## 2018-11-26 ENCOUNTER — Encounter: Payer: Self-pay | Admitting: Cardiology

## 2018-11-26 NOTE — Progress Notes (Signed)
Remote pacemaker transmission.   

## 2018-11-27 ENCOUNTER — Ambulatory Visit: Payer: Medicare Other | Admitting: Internal Medicine

## 2018-12-16 ENCOUNTER — Telehealth: Payer: Self-pay | Admitting: Family Medicine

## 2018-12-16 NOTE — Telephone Encounter (Signed)
Patient called because they would like to get a letter stating she has a conditioin order to provide to the airlines. Patient states she was set to travel to [place] however, due to what is going on she states she will not be traveling anymore there. Patient states she would like for the letter to be emailed if possible. Please follow up.

## 2018-12-18 NOTE — Telephone Encounter (Signed)
Letter is ready for pick-up.

## 2018-12-18 NOTE — Telephone Encounter (Signed)
Patient was called and states that she needs a letter stating her medical conditions so that she can get a refund for her plane ticket.

## 2018-12-19 NOTE — Telephone Encounter (Signed)
Letter has been emailed to e-mail provided.

## 2018-12-28 ENCOUNTER — Other Ambulatory Visit: Payer: Self-pay | Admitting: Physician Assistant

## 2019-01-19 ENCOUNTER — Other Ambulatory Visit: Payer: Self-pay | Admitting: Family Medicine

## 2019-01-19 DIAGNOSIS — J00 Acute nasopharyngitis [common cold]: Secondary | ICD-10-CM

## 2019-02-02 ENCOUNTER — Other Ambulatory Visit: Payer: Self-pay | Admitting: Family Medicine

## 2019-02-11 ENCOUNTER — Telehealth: Payer: Self-pay

## 2019-02-11 NOTE — Telephone Encounter (Signed)
I called and spoke with patient, she is ok with changing office visit to telehealth visit on 02/21/19.     Virtual Visit Pre-Appointment Phone Call  "(Name), I am calling you today to discuss your upcoming appointment. We are currently trying to limit exposure to the virus that causes COVID-19 by seeing patients at home rather than in the office."  1. "What is the BEST phone number to call the day of the visit?" - include this in appointment notes  2. "Do you have or have access to (through a family member/friend) a smartphone with video capability that we can use for your visit?" a. If yes - list this number in appt notes as "cell" (if different from BEST phone #) and list the appointment type as a VIDEO visit in appointment notes b. If no - list the appointment type as a PHONE visit in appointment notes  3. Confirm consent - "In the setting of the current Covid19 crisis, you are scheduled for a (phone or video) visit with your provider on (date) at (time).  Just as we do with many in-office visits, in order for you to participate in this visit, we must obtain consent.  If you'd like, I can send this to your mychart (if signed up) or email for you to review.  Otherwise, I can obtain your verbal consent now.  All virtual visits are billed to your insurance company just like a normal visit would be.  By agreeing to a virtual visit, we'd like you to understand that the technology does not allow for your provider to perform an examination, and thus may limit your provider's ability to fully assess your condition. If your provider identifies any concerns that need to be evaluated in person, we will make arrangements to do so.  Finally, though the technology is pretty good, we cannot assure that it will always work on either your or our end, and in the setting of a video visit, we may have to convert it to a phone-only visit.  In either situation, we cannot ensure that we have a secure connection.  Are you  willing to proceed?" STAFF: Did the patient verbally acknowledge consent to telehealth visit? Document YES/NO here: YES  4. Advise patient to be prepared - "Two hours prior to your appointment, go ahead and check your blood pressure, pulse, oxygen saturation, and your weight (if you have the equipment to check those) and write them all down. When your visit starts, your provider will ask you for this information. If you have an Apple Watch or Kardia device, please plan to have heart rate information ready on the day of your appointment. Please have a pen and paper handy nearby the day of the visit as well."  5. Give patient instructions for MyChart download to smartphone OR Doximity/Doxy.me as below if video visit (depending on what platform provider is using)  6. Inform patient they will receive a phone call 15 minutes prior to their appointment time (may be from unknown caller ID) so they should be prepared to answer    TELEPHONE CALL NOTE  Quinnley Colasurdo Keast has been deemed a candidate for a follow-up tele-health visit to limit community exposure during the Covid-19 pandemic. I spoke with the patient via phone to ensure availability of phone/video source, confirm preferred email & phone number, and discuss instructions and expectations.  I reminded Cynthia Underwood to be prepared with any vital sign and/or heart rhythm information that could potentially be obtained via  home monitoring, at the time of her visit. I reminded Efrat Zuidema Hidrogo to expect a phone call prior to her visit.  Mady Haagensen, [place] 02/11/2019 12:41 PM   INSTRUCTIONS FOR DOWNLOADING THE MYCHART APP TO SMARTPHONE  - The patient must first make sure to have activated MyChart and know their login information - If Apple, go to [employer] and type in MyChart in the search bar and download the app. If Android, ask patient to go to [employer] and type in [place] in the search bar and download the app. The app is free but as  with any other app downloads, their phone may require them to verify saved payment information or Apple/Android password.  - The patient will need to then log into the app with their MyChart username and password, and select [facility] as their healthcare provider to link the account. When it is time for your visit, go to the MyChart app, find appointments, and click Begin Video Visit. Be sure to Select Allow for your device to access the Microphone and Camera for your visit. You will then be connected, and your provider will be with you shortly.  **If they have any issues connecting, or need assistance please contact MyChart service desk [phone])**  **If using a computer, in order to ensure the best quality for their visit they will need to use either of the following Internet Browsers: [employer], or Google Chrome**  IF USING DOXIMITY or DOXY.ME - The patient will receive a link just prior to their visit by text.     FULL LENGTH CONSENT FOR TELE-HEALTH VISIT   I hereby voluntarily request, consent and authorize [facility] and its employed or contracted physicians, physician assistants, nurse practitioners or other licensed health care professionals (the Practitioner), to provide me with telemedicine health care services (the "Services") as deemed necessary by the treating Practitioner. I acknowledge and consent to receive the Services by the Practitioner via telemedicine. I understand that the telemedicine visit will involve communicating with the Practitioner through live audiovisual communication technology and the disclosure of certain medical information by electronic transmission. I acknowledge that I have been given the opportunity to request an in-person assessment or other available alternative prior to the telemedicine visit and am voluntarily participating in the telemedicine visit.  I understand that I have the right to withhold or withdraw my consent to the  use of telemedicine in the course of my care at any time, without affecting my right to future care or treatment, and that the Practitioner or I may terminate the telemedicine visit at any time. I understand that I have the right to inspect all information obtained and/or recorded in the course of the telemedicine visit and may receive copies of available information for a reasonable fee.  I understand that some of the potential risks of receiving the Services via telemedicine include:  Marland Kitchen Delay or interruption in medical evaluation due to technological equipment failure or disruption; . Information transmitted may not be sufficient (e.g. poor resolution of images) to allow for appropriate medical decision making by the Practitioner; and/or  . In rare instances, security protocols could fail, causing a breach of personal health information.  Furthermore, I acknowledge that it is my responsibility to provide information about my medical history, conditions and care that is complete and accurate to the best of my ability. I acknowledge that Practitioner's advice, recommendations, and/or decision may be based on factors not within their control, such as  incomplete or inaccurate data provided by me or distortions of diagnostic images or specimens that may result from electronic transmissions. I understand that the practice of medicine is not an exact science and that Practitioner makes no warranties or guarantees regarding treatment outcomes. I acknowledge that I will receive a copy of this consent concurrently upon execution via email to the email address I last provided but may also request a printed copy by calling the office of [facility].    I understand that my insurance will be billed for this visit.   I have read or had this consent read to me. . I understand the contents of this consent, which adequately explains the benefits and risks of the Services being provided via telemedicine.  . I have  been provided ample opportunity to ask questions regarding this consent and the Services and have had my questions answered to my satisfaction. . I give my informed consent for the services to be provided through the use of telemedicine in my medical care  By participating in this telemedicine visit I agree to the above.

## 2019-02-12 DIAGNOSIS — Z950 Presence of cardiac pacemaker: Secondary | ICD-10-CM | POA: Diagnosis not present

## 2019-02-12 DIAGNOSIS — I498 Other specified cardiac arrhythmias: Secondary | ICD-10-CM | POA: Diagnosis not present

## 2019-02-12 DIAGNOSIS — R0602 Shortness of breath: Secondary | ICD-10-CM | POA: Diagnosis not present

## 2019-02-17 ENCOUNTER — Ambulatory Visit (INDEPENDENT_AMBULATORY_CARE_PROVIDER_SITE_OTHER): Payer: Medicare Other | Admitting: *Deleted

## 2019-02-17 DIAGNOSIS — I441 Atrioventricular block, second degree: Secondary | ICD-10-CM

## 2019-02-17 LAB — CUP PACEART REMOTE DEVICE CHECK
Battery Remaining Longevity: 119 mo
Battery Voltage: 3.02 V
Brady Statistic AP VP Percent: 4.93 %
Brady Statistic AP VS Percent: 46.89 %
Brady Statistic AS VP Percent: 33.45 %
Brady Statistic AS VS Percent: 14.73 %
Brady Statistic RA Percent Paced: 51.69 %
Brady Statistic RV Percent Paced: 38.38 %
Date Time Interrogation Session: 20200615060419
Implantable Lead Implant Date: 20180614
Implantable Lead Implant Date: 20180614
Implantable Lead Location: 753859
Implantable Lead Location: 753859
Implantable Lead Model: 3830
Implantable Lead Model: 5076
Implantable Pulse Generator Implant Date: 20180614
Lead Channel Impedance Value: 266 Ohm
Lead Channel Impedance Value: 323 Ohm
Lead Channel Impedance Value: 418 Ohm
Lead Channel Impedance Value: 551 Ohm
Lead Channel Pacing Threshold Amplitude: 0.75 V
Lead Channel Pacing Threshold Amplitude: 1 V
Lead Channel Pacing Threshold Pulse Width: 0.4 ms
Lead Channel Pacing Threshold Pulse Width: 0.4 ms
Lead Channel Sensing Intrinsic Amplitude: 3.5 mV
Lead Channel Sensing Intrinsic Amplitude: 3.5 mV
Lead Channel Sensing Intrinsic Amplitude: 4.25 mV
Lead Channel Sensing Intrinsic Amplitude: 4.25 mV
Lead Channel Setting Pacing Amplitude: 2 V
Lead Channel Setting Pacing Amplitude: 2 V
Lead Channel Setting Pacing Pulse Width: 1 ms
Lead Channel Setting Sensing Sensitivity: 2 mV

## 2019-02-21 ENCOUNTER — Encounter: Payer: Self-pay | Admitting: Internal Medicine

## 2019-02-21 ENCOUNTER — Other Ambulatory Visit: Payer: Self-pay

## 2019-02-21 ENCOUNTER — Telehealth (INDEPENDENT_AMBULATORY_CARE_PROVIDER_SITE_OTHER): Payer: Medicare Other | Admitting: Internal Medicine

## 2019-02-21 VITALS — Ht 63.0 in | Wt 115.4 lb

## 2019-02-21 DIAGNOSIS — I4510 Unspecified right bundle-branch block: Secondary | ICD-10-CM | POA: Diagnosis not present

## 2019-02-21 DIAGNOSIS — G7111 Myotonic muscular dystrophy: Secondary | ICD-10-CM

## 2019-02-21 DIAGNOSIS — Z950 Presence of cardiac pacemaker: Secondary | ICD-10-CM

## 2019-02-21 DIAGNOSIS — R001 Bradycardia, unspecified: Secondary | ICD-10-CM

## 2019-02-21 DIAGNOSIS — R0602 Shortness of breath: Secondary | ICD-10-CM

## 2019-02-21 NOTE — Progress Notes (Signed)
Electrophysiology TeleHealth Note   Due to national recommendations of social distancing due to COVID 19, an audio/video telehealth visit is felt to be most appropriate for this patient at this time.  See MyChart message from today for the patient's consent to telehealth for [facility].   Date:  02/21/2019   ID:  [name], DOB 17-May-1971, MRN [id number]  Location: patient's home  Provider location: [address] [place]  Evaluation Performed: Follow-up visit  PCP:  Charlott Rakes, MD  Cardiologist:    TT Electrophysiologist:  SK   Chief Complaint:  Heart block and myotonic dystrophy  History of Present Illness:    Cynthia Underwood is a 48 y.o. female who presents via audio/video conferencing for a telehealth visit today. The patient did not have access to video technology/had technical difficulties with video requiring transitioning to audio format only (telephone).  All issues noted in this document were discussed and addressed.  No physical exam could be performed with this format.     Since last being seen in our clinic, the patient reports doing well  Unless its hot and going a long way, shepatient denies chest pain, shortness of breath, nocturnal dyspnea, orthopnea or peripheral edema.  There have been no palpitations, lightheadedness or syncope.   She has a history of myotonic dystrophy-DM 1 Cardiac evaluation has been extensive. MRI scanning >>> late gadolinium enhancement in the inferior and inferolateral walls.   FDG-PET >>> no uptake and hence not consistent with sarcoid  Underwent pacing in 2018  The patient denies symptoms of fevers, chills, cough, or new SOB worrisome for COVID 19.    Past Medical History:  Diagnosis Date  . Anemia   . Bifascicular block 10/02/2014  . Depression   . Excess ear wax   . 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 Presence of permanent cardiac pacemaker   . RBBB (right  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) 10/02/2014  . Sickle cell trait ([place])   . Watery eyes    Left  . Weakness of both legs     Past Surgical History:  Procedure Laterality Date  . BREAST BIOPSY Right   . EYE SURGERY Left 2017   "related to blockage in my nose"  . INSERT / REPLACE / REMOVE PACEMAKER  02/15/2017  . PACEMAKER IMPLANT N/A 02/15/2017   Procedure: Pacemaker Implant;  Surgeon: Deboraha Sprang, MD;  Location: [facility];  Service: Cardiovascular;  Laterality: N/A;    Current Outpatient Medications  Medication Sig Dispense Refill  . albuterol (PROVENTIL HFA;VENTOLIN HFA) 108 (90 Base) MCG/ACT inhaler Inhale 2 puffs into the lungs every 4 (four) hours as needed for wheezing or shortness of breath (or coughing). 1 Inhaler 0  . atorvastatin (LIPITOR) 20 MG tablet TAKE 1 TABLET BY MOUTH EVERY DAY 90 tablet 0  . cetirizine (ZYRTEC) 10 MG tablet TAKE 1 TABLET BY MOUTH EVERY DAY 30 tablet 2  . hydrOXYzine (ATARAX/VISTARIL) 25 MG tablet Take 1 tablet (25 mg total) by mouth at bedtime. 30 tablet 3  . mirtazapine (REMERON) 7.5 MG tablet Take 1/2 tablet daily at bedtime. 135 tablet 1  . naproxen (NAPROSYN) 500 MG tablet Take 1 tablet (500 mg total) by mouth every 12 (twelve) hours as needed for moderate pain. 60 tablet 0  . Vitamin D, Ergocalciferol, (DRISDOL) 1.25 MG (50000 UT) CAPS capsule TAKE 1 CAPSULE (50,000 UNITS TOTAL) BY MOUTH  EVERY 7 (SEVEN) DAYS. 16 capsule 0   No current facility-administered medications for this visit.     Allergies:   Penicillin g and Penicillins   Social History:  The patient  reports that she quit smoking about 2 years ago. Her smoking use included cigarettes. She smoked 0.00 packs per day for 28.00 years. She has never used smokeless tobacco. She reports current alcohol use. She reports current drug use. Frequency: 2.00 times per week. Drug: Marijuana.   Family History:  The patient's   family history includes COPD in her  mother; Cancer (age of onset: 45) in her father; Diabetes in her maternal uncle and paternal grandmother; Heart disease in her father, maternal uncle, and paternal grandmother; Hypertension in her paternal grandmother; Neuromuscular disorder in her cousin, maternal aunt, and sister.   ROS:  Please see the history of present illness.   All other systems are personally reviewed and negative.    Exam:    Vital Signs:  Ht 5\' 3"  (1.6 m)   Wt 115 lb 6.4 oz (52.3 kg)   BMI 20.44 kg/m      Labs/Other Tests and Data Reviewed:    Recent Labs: 09/11/2018: ALT 8; BUN 15; Creatinine, Ser 0.86; Hemoglobin 12.6; Platelets 288; Potassium 4.0; Sodium 144; TSH 1.500   Wt Readings from Last 3 Encounters:  02/21/19 115 lb 6.4 oz (52.3 kg)  11/07/18 122 lb 6.4 oz (55.5 kg)  10/29/18 120 lb 12.8 oz (54.8 kg)     Other studies personally reviewed: Additional studies/ records that were reviewed today include:As above     Last device remote is reviewed from [facility] PDF dated 4/20 and 3/20 * which reveals normal device function,   arrhythmias - some SVT and false positive detection of AFib   ASSESSMENT & PLAN:    Myotonic dystrophy  Sinus node dysfunction  MBZ 2 AV block  Pacer Medtronic HIS Bundle with septal capture   Device function stable  Continue current meds    COVID 19 screen The patient denies symptoms of COVID 19 at this time.  The importance of social distancing was discussed today.  Follow-up:  9 m  And change TT from 2/21>>12/20 Next remote:As Scheduled   Current medicines are reviewed at length with the patient today.   The patient does not have concerns regarding her medicines.  The following changes were made today:  none  Labs/ tests ordered today include:   No orders of the defined types were placed in this encounter.   Future tests ( post COVID )     Patient Risk:  after full review of this patients clinical status, I feel that they are at moderate risk at  this time.  Today, I have spent 6 minutes with the patient with telehealth technology discussing the above.  Signed, Virl Axe, MD  02/21/2019 1:59 PM     [place] [address] [phone] (office) [phone] (fax)

## 2019-02-25 ENCOUNTER — Encounter: Payer: Self-pay | Admitting: Cardiology

## 2019-02-25 NOTE — Progress Notes (Signed)
Remote pacemaker transmission.   

## 2019-04-09 DIAGNOSIS — H2511 Age-related nuclear cataract, right eye: Secondary | ICD-10-CM | POA: Diagnosis not present

## 2019-04-09 DIAGNOSIS — H25811 Combined forms of age-related cataract, right eye: Secondary | ICD-10-CM | POA: Diagnosis not present

## 2019-04-13 IMAGING — CT CT CHEST HIGH RESOLUTION W/O CM
2 of 5 series · 15 of 36 positions shown, 18 images · non-contrast
Comparison: None.

CLINICAL DATA: 45-year-old female with history of chronic dyspnea
on exertion for several years. Now with shortness of breath even
while resting. Former smoker. Evaluate for interstitial lung
disease.

EXAM:
CT CHEST WITHOUT CONTRAST
TECHNIQUE: Multidetector CT imaging of the chest was performed following the
standard protocol without intravenous contrast. High resolution
imaging of the lungs, as well as inspiratory and expiratory imaging,
was performed.

[Series 2: high resolution · axial · 0.57mm/px · z∈[-228,-0]mm · 12 of 126 slices shown, 15 images]
[im 6/126  mediastinal]
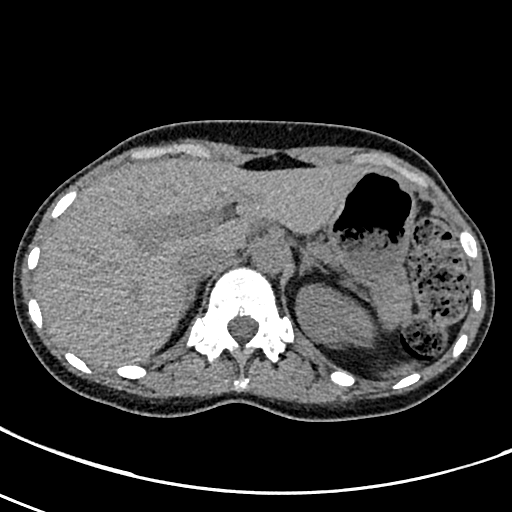
[im 6/126  lung]
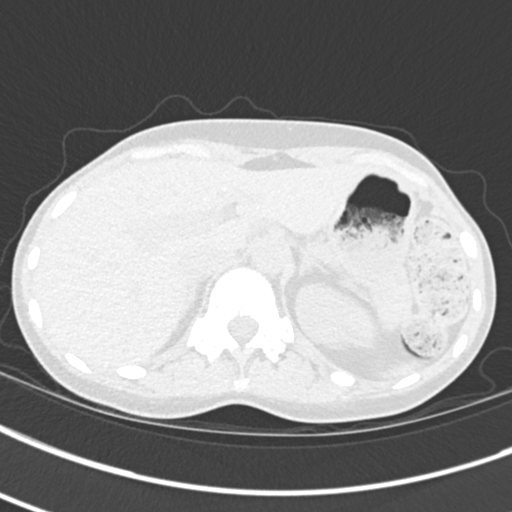
[im 18/126  lung]
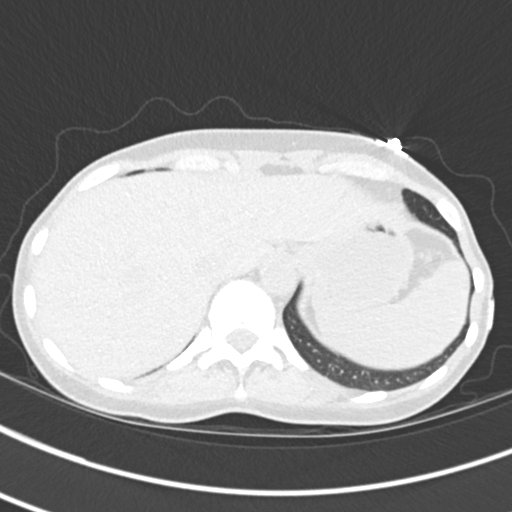
[im 30/126  lung]
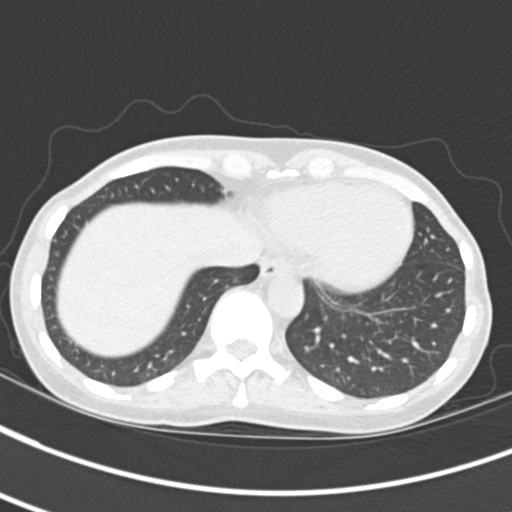
[im 36/126  lung]
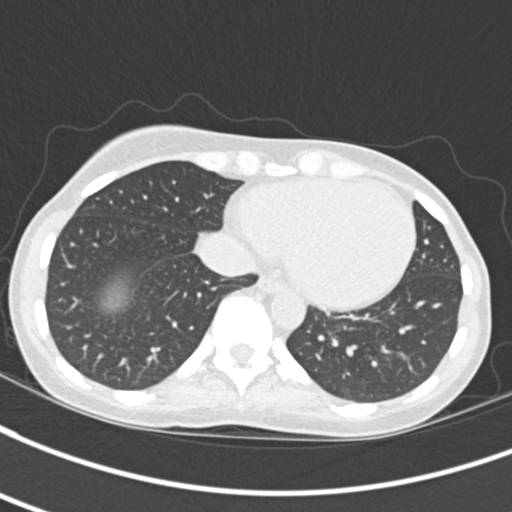
[im 48/126  mediastinal]
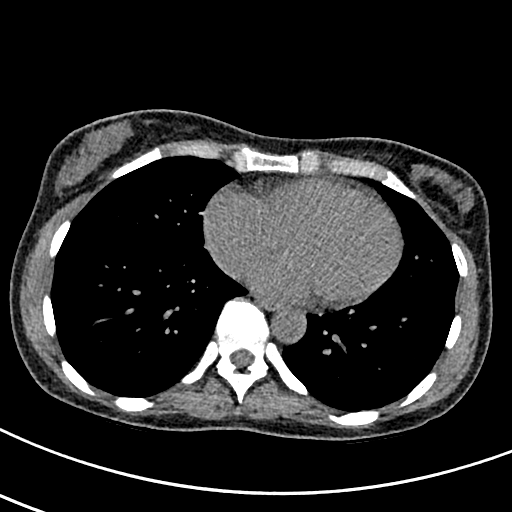
[im 48/126  lung]
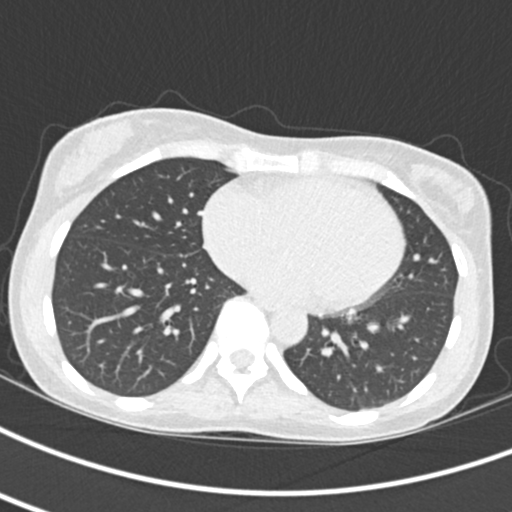
[im 60/126  lung]
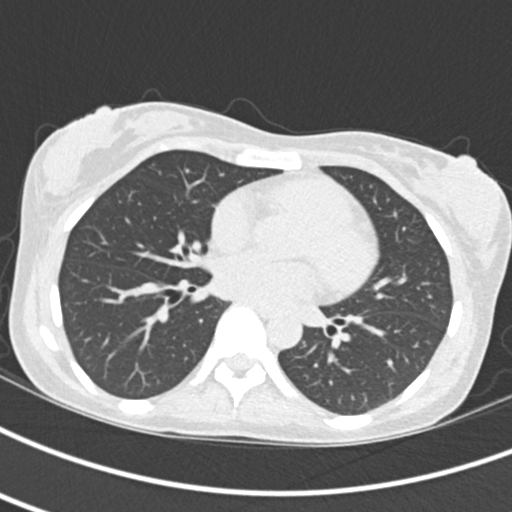
[im 66/126  lung]
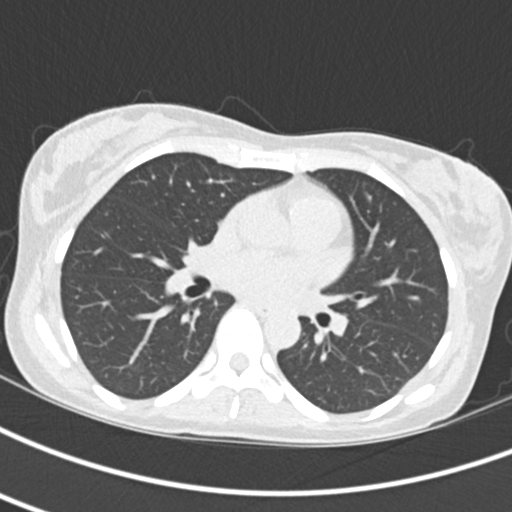
[im 78/126  lung]
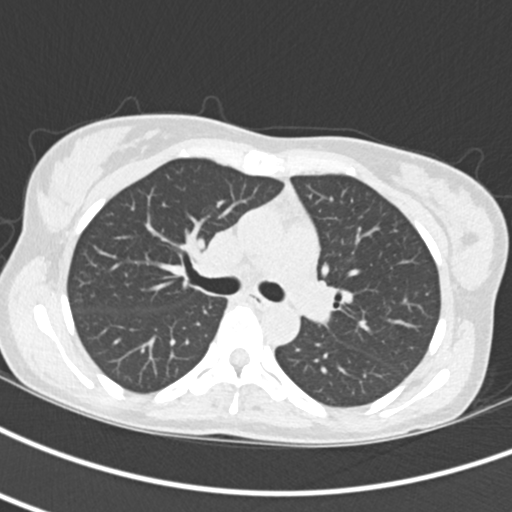
[im 90/126  mediastinal]
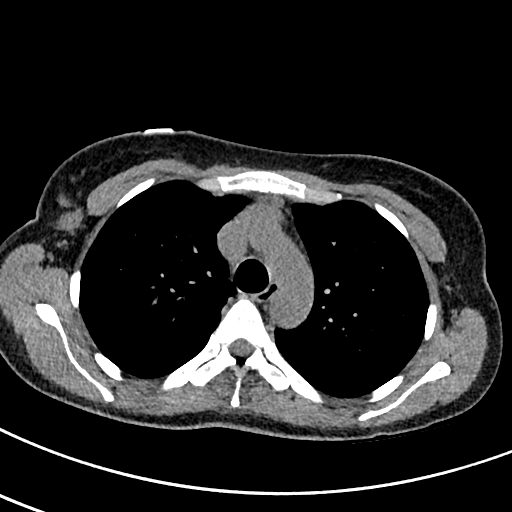
[im 90/126  lung]
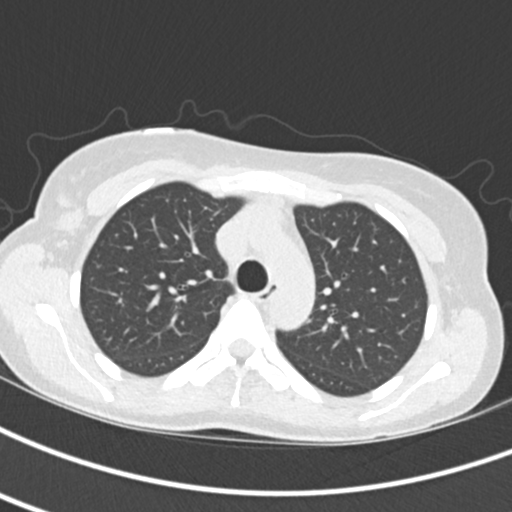
[im 96/126  lung]
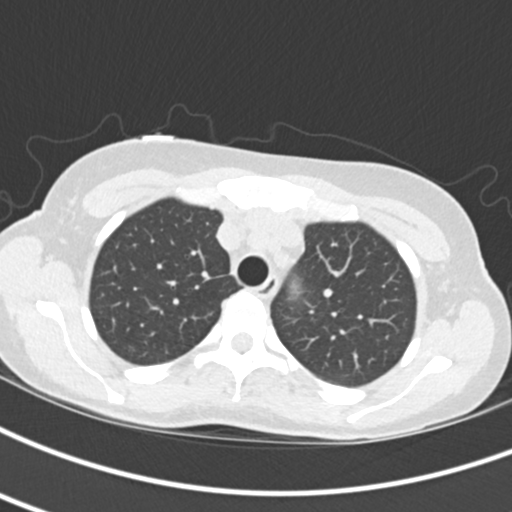
[im 108/126  lung]
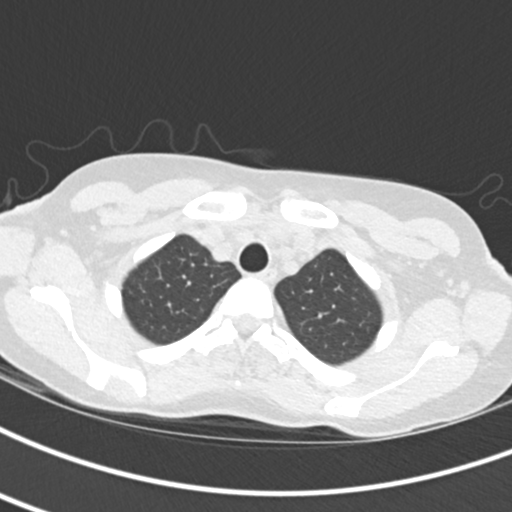
[im 120/126  lung]
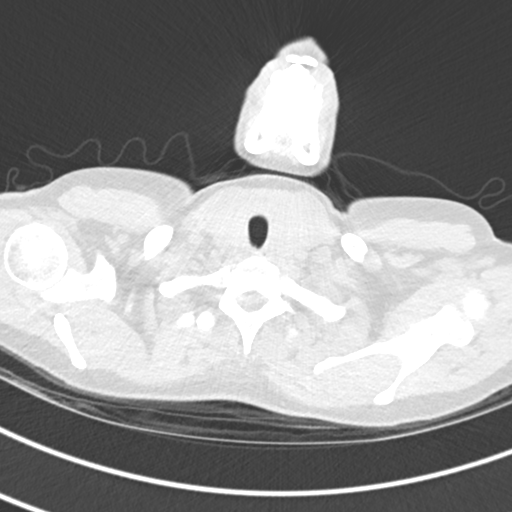

[Series 8: coronal · coronal · 0.52mm/px · 3 of 87 slices shown]
[im 18/87  lung]
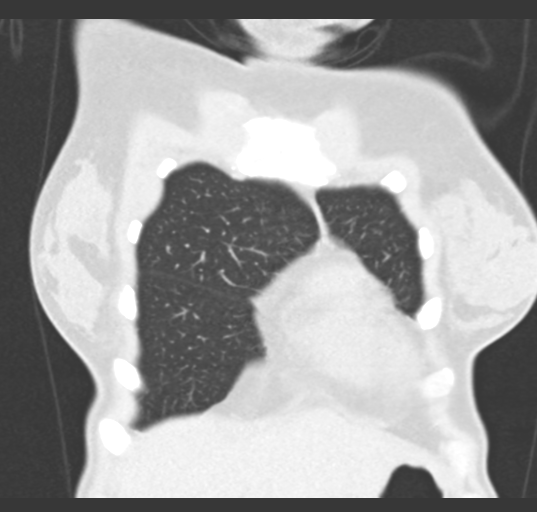
[im 35/87  lung]
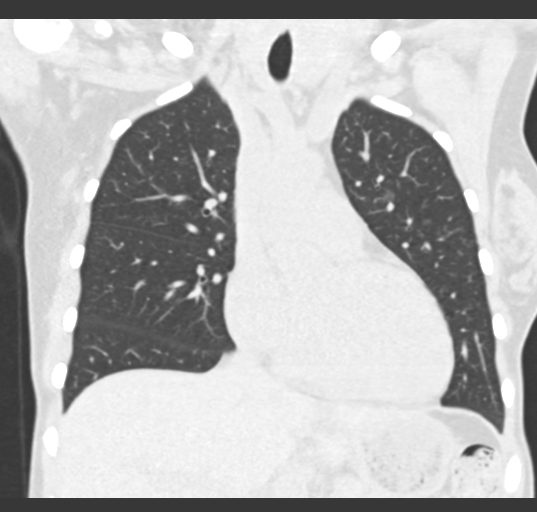
[im 52/87  lung]
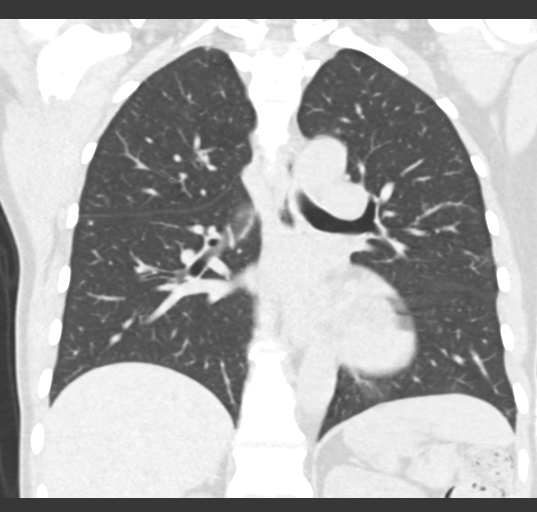

[15 of 36 positions shown; findings below may reference images not displayed]

FINDINGS: Cardiovascular: Heart size is normal. There is no significant
pericardial fluid, thickening or pericardial calcification.
Atherosclerosis in the thoracic aorta (mild). No definite coronary
artery calcifications are identified.

Mediastinum/Nodes: No pathologically enlarged mediastinal or hilar
lymph nodes. No pathologically enlarged mediastinal or hilar lymph
nodes. Please note that accurate exclusion of hilar adenopathy is
limited on noncontrast CT scans. Esophagus is unremarkable in
appearance.

Lungs/Pleura: High-resolution images demonstrate no significant
regions of ground-glass attenuation, subpleural reticulation,
parenchymal banding, traction bronchiectasis or frank honeycombing.
Inspiratory and expiratory imaging is unremarkable. No suspicious
appearing pulmonary nodules or masses. No acute consolidative
airspace disease. No pleural effusions.

Upper Abdomen: Incompletely visualized low-attenuation lesion in the
upper pole of left kidney measuring at least 2 cm is incompletely
characterized on today's noncontrast CT examination, but is
statistically likely a cyst.

Musculoskeletal: There are no aggressive appearing lytic or blastic
lesions noted in the visualized portions of the skeleton.
IMPRESSION: 1. No evidence of of interstitial lung disease.
2. No acute findings in the thorax to account for the patient's
symptoms.

## 2019-05-06 HISTORY — PX: EYE SURGERY: SHX253

## 2019-05-08 ENCOUNTER — Encounter (HOSPITAL_COMMUNITY): Payer: Self-pay | Admitting: Cardiology

## 2019-05-08 ENCOUNTER — Other Ambulatory Visit: Payer: Self-pay | Admitting: Family Medicine

## 2019-05-19 ENCOUNTER — Ambulatory Visit (INDEPENDENT_AMBULATORY_CARE_PROVIDER_SITE_OTHER): Payer: Medicare Other | Admitting: *Deleted

## 2019-05-19 ENCOUNTER — Other Ambulatory Visit: Payer: Self-pay | Admitting: Cardiology

## 2019-05-19 DIAGNOSIS — Z1231 Encounter for screening mammogram for malignant neoplasm of breast: Secondary | ICD-10-CM

## 2019-05-19 DIAGNOSIS — I452 Bifascicular block: Secondary | ICD-10-CM

## 2019-05-19 DIAGNOSIS — I441 Atrioventricular block, second degree: Secondary | ICD-10-CM | POA: Diagnosis not present

## 2019-05-19 LAB — CUP PACEART REMOTE DEVICE CHECK
Battery Remaining Longevity: 117 mo
Battery Voltage: 3.01 V
Brady Statistic AP VP Percent: 9.44 %
Brady Statistic AP VS Percent: 45.12 %
Brady Statistic AS VP Percent: 33.58 %
Brady Statistic AS VS Percent: 11.86 %
Brady Statistic RA Percent Paced: 54.27 %
Brady Statistic RV Percent Paced: 43.02 %
Date Time Interrogation Session: 20200914060444
Implantable Lead Implant Date: 20180614
Implantable Lead Implant Date: 20180614
Implantable Lead Location: 753859
Implantable Lead Location: 753859
Implantable Lead Model: 3830
Implantable Lead Model: 5076
Implantable Pulse Generator Implant Date: 20180614
Lead Channel Impedance Value: 285 Ohm
Lead Channel Impedance Value: 342 Ohm
Lead Channel Impedance Value: 494 Ohm
Lead Channel Impedance Value: 589 Ohm
Lead Channel Pacing Threshold Amplitude: 0.625 V
Lead Channel Pacing Threshold Amplitude: 1.125 V
Lead Channel Pacing Threshold Pulse Width: 0.4 ms
Lead Channel Pacing Threshold Pulse Width: 0.4 ms
Lead Channel Sensing Intrinsic Amplitude: 3.75 mV
Lead Channel Sensing Intrinsic Amplitude: 3.75 mV
Lead Channel Sensing Intrinsic Amplitude: 5 mV
Lead Channel Sensing Intrinsic Amplitude: 5 mV
Lead Channel Setting Pacing Amplitude: 2 V
Lead Channel Setting Pacing Amplitude: 2 V
Lead Channel Setting Pacing Pulse Width: 1 ms
Lead Channel Setting Sensing Sensitivity: 2 mV

## 2019-05-29 ENCOUNTER — Ambulatory Visit (HOSPITAL_COMMUNITY): Payer: Medicare Other | Attending: Internal Medicine

## 2019-05-29 ENCOUNTER — Other Ambulatory Visit: Payer: Self-pay

## 2019-05-29 ENCOUNTER — Encounter (INDEPENDENT_AMBULATORY_CARE_PROVIDER_SITE_OTHER): Payer: Self-pay

## 2019-05-29 DIAGNOSIS — I4510 Unspecified right bundle-branch block: Secondary | ICD-10-CM | POA: Diagnosis not present

## 2019-06-02 ENCOUNTER — Telehealth: Payer: Self-pay

## 2019-06-02 NOTE — Telephone Encounter (Signed)
I spoke with the pt and told her our rule of thumbs is if you going to be gone for more than 7 days to take the home monitor with you. I asked her how long was she was going to be gone in December and she states she do not know. I asked her do she wants to wait until it get closer to the time for her to go to [place] to reschedule her home remote appointment. She states she wants to wait and call back later once she get more details about her trip.

## 2019-06-02 NOTE — Progress Notes (Signed)
Remote pacemaker transmission.   

## 2019-06-02 NOTE — Telephone Encounter (Signed)
-----   Message from Michae Kava, [place] sent at 06/02/2019  3:03 PM EDT ----- Regarding: REMOTE CHECK 08/18/19 Hi Ladies,  I s/w pt about her echo results. Pt states she is scheduled for her remote check 08/18/19. Pt states she said she will be in [place] at that time. Pt wants to know what does she need to bring to be able to do her remote check from [place]. I assured her that I will send a message to the device clinic for someone to please reach out to her.   Thank you Arbie Cookey

## 2019-06-06 ENCOUNTER — Telehealth: Payer: Self-pay | Admitting: Family Medicine

## 2019-06-06 NOTE — Telephone Encounter (Signed)
ericka called wanting to get a update on a requisition for that was sent over for pcp states it was sent back on sep 8th. Please follow up.

## 2019-06-09 NOTE — Telephone Encounter (Signed)
Phone number was called back to get more information. The phone number is a recording.

## 2019-06-23 ENCOUNTER — Encounter: Payer: Self-pay | Admitting: Neurology

## 2019-06-23 ENCOUNTER — Ambulatory Visit (INDEPENDENT_AMBULATORY_CARE_PROVIDER_SITE_OTHER): Payer: Medicare Other | Admitting: Neurology

## 2019-06-23 ENCOUNTER — Other Ambulatory Visit: Payer: Self-pay

## 2019-06-23 VITALS — BP 90/55 | HR 82 | Temp 97.7°F | Ht 63.0 in | Wt 106.0 lb

## 2019-06-23 DIAGNOSIS — R519 Headache, unspecified: Secondary | ICD-10-CM | POA: Diagnosis not present

## 2019-06-23 DIAGNOSIS — G7111 Myotonic muscular dystrophy: Secondary | ICD-10-CM | POA: Diagnosis not present

## 2019-06-23 NOTE — Progress Notes (Signed)
Follow-up Visit   Date: 06/23/19    Cynthia Underwood MRN: [id number] DOB: [birth year]   Interim History: Cynthia Underwood is a 48 y.o. 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  The patient was accompanied to the clinic by self.  History of present illness: Starting around the age of 41, she started having lock jaw and stiffness of the hands. Over the years, she developed worsening stiffness of her fingers and hands. Because her maternal aunt, who is also a patient of mine, had known myotonic dystrophy, she ultimately was genetically tested at the age of 57 which confirmed the diagnosis of myotonic dystrophy type 1. She has a strong family history of DM1 including maternal aunt, cousins x 2, and younger sister. She does not have any children and has no future plans for pregnancy.  Symptoms were relatively stable until her mid-30s and then started developing fatigue, weakness, daytime sleepiness, and shortness of breath with exertion. She walks independently but was told previously told to use leg braces.  Over the past few years, she noticed intermittent difficulty swallowing liquids. She underwent barium swallow which reportedly showed signs of aspiration and she was recommended to use a straw and use chin tuck position.   She was seeing several neurologists over the years at [facility] in [place]. She is not working and has been on disability since 2010.  She established care with me in February 2016.  She had significant leg weakness where she had to lift her left leg out of the car because it is so weak.  She feels that she no longer has any good days, they are either "bad or worse".  In fall of 2016, she had to move her bedroom to the [place] because of difficulty  climbing stairs.  Since completing home PT/OT, her muscle strength has improved and sometimes, she is able to walk without AFO.  By 2017, she was able to climb stairs and went back to sleeping upstairs. She was started in Cymbalta 30mg  for mood, fatigue and myalgias which significantly helped, but self-discontinued this in 2018 due to feeling it was ineffective.    She followed by cardiology for abnormal EKG with RBBB and LAFB as well as myocardial thinning.  In 2018, she had dual chamber PPM implanted by Dr. Caryl Comes.  She is also undergoing pulmonology work-up for dyspnea, which is  suggestive of restrictive changes, consistent with muscular weakness due to DM1.    UPDATE 06/23/2019:  She is here for follow-up visit.  Overall, she is doing well. Since stopped Remeron, her weight has dropped back to 106lb.  She admits to only eating one meal per day due to poor PO intake.  She also complains of bitemporal achy pain, which occurs daily.  No associated vision changes, light or noise sensitivitity.  She does not take anything to treat the headache.  No new weakness or changes in speech/swallow.  She continues to have good days and bad days with her leg weakness.  She has suffered 1 fall this year.  She is very compliant with using her ankle-foot orthotics.  Her mood is doing better.  Unfortunately, she continues to have difficulty with insomnia.  Medications:  Current Outpatient Medications on File Prior to Visit  Medication Sig Dispense Refill  . albuterol (PROVENTIL HFA;VENTOLIN HFA)  108 (90 Base) MCG/ACT inhaler Inhale 2 puffs into the lungs every 4 (four) hours as needed for wheezing or shortness of breath (or coughing). 1 Inhaler 0  . atorvastatin (LIPITOR) 20 MG tablet TAKE 1 TABLET BY MOUTH EVERY DAY 90 tablet 0  . cetirizine (ZYRTEC) 10 MG tablet TAKE 1 TABLET BY MOUTH EVERY DAY 30 tablet 2  . hydrOXYzine (ATARAX/VISTARIL) 25 MG tablet Take 1 tablet (25 mg total) by mouth at bedtime. 30 tablet 3  .  naproxen (NAPROSYN) 500 MG tablet Take 1 tablet (500 mg total) by mouth every 12 (twelve) hours as needed for moderate pain. 60 tablet 0  . prednisoLONE acetate (PRED FORTE) 1 % ophthalmic suspension INSTILL 1 DROP IN THE RIGHT EYE THREE TIMES DAILY    . Vitamin D, Ergocalciferol, (DRISDOL) 1.25 MG (50000 UT) CAPS capsule TAKE 1 CAPSULE (50,000 UNITS TOTAL) BY MOUTH EVERY 7 (SEVEN) DAYS. 16 capsule 0  . mirtazapine (REMERON) 7.5 MG tablet Take 1/2 tablet daily at bedtime. (Patient not taking: Reported on 06/23/2019) 135 tablet 1   No current facility-administered medications on file prior to visit.     Allergies:  Allergies  Allergen Reactions  . Penicillin G Other (See Comments), Rash and Nausea And Vomiting    Sore throat  . Penicillins Nausea And Vomiting, Rash and Other (See Comments)    Fever Has patient had a PCN reaction causing immediate rash, facial/tongue/throat swelling, SOB or lightheadedness with hypotension: Yes Has patient had a PCN reaction causing severe rash involving mucus membranes or skin necrosis: Unknown Has patient had a PCN reaction that required hospitalization: No Has patient had a PCN reaction occurring within the last 10 years: No If all of the above answers are "NO", then may proceed with Cephalosporin use.     Review of Systems:  CONSTITUTIONAL: No fevers, chills, night sweats, or weight loss.  EYES: No visual changes or eye pain ENT: No hearing changes.  No history of nose bleeds.   RESPIRATORY: No cough, wheezing and shortness of breath.   CARDIOVASCULAR: Negative for chest pain, and palpitations.   GI: Negative for abdominal discomfort, blood in stools or black stools.  No recent change in bowel habits.   GU:  No history of incontinence.   MUSCLOSKELETAL: No history of joint pain or swelling.  No myalgias.   SKIN: Negative for lesions, rash, and itching.   ENDOCRINE: Negative for cold or heat intolerance, polydipsia or goiter.   PSYCH:  No  depression or anxiety symptoms.   NEURO: As Above.   Vital Signs:  BP (!) 90/55   Pulse 82   Temp 97.7 F (36.5 C)   Ht 5\' 3"  (1.6 m)   Wt 106 lb (48.1 kg)   SpO2 98%   BMI 18.78 kg/m   General Medical Exam:   General:  Mytonic facies with temporal wasting, elongated facies, thin appearing Eyes/ENT: see cranial nerve examination.   Neck:   No carotid bruits. Respiratory:  Clear to auscultation, good air entry bilaterally.   Cardiac:  Regular rate and rhythm, no murmur.   Ext:  No edema   Neurological Exam: MENTAL STATUS including orientation to time, place, person, recent and remote memory, attention span and concentration, language, and fund of knowledge is normal.  Speech is mildly dysarthric and hypophonic.    CRANIAL NERVES: Pupils equal round and reactive to light.. Normal conjugate, extra-ocular eye movements in all directions of gaze. Mild bilateral ptosis. Typical mytonic facies with temporal wasting and transverse  smile. Mild facial weakness as noted frontalis and oribicularis oculi and oris.  MOTOR:  Generalized loss of muscle bulk. Mild grip myotonia bilaterally. Tone is normal.   Right Upper Extremity:    Left Upper Extremity:    Deltoid  5-/5   Deltoid  5-/5   Biceps  5-/5   Biceps  5-/5   Triceps  5-/5   Triceps  5-/5   Wrist extensors  5/5   Wrist extensors  5/5   Wrist flexors  5/5   Wrist flexors  5/5   Finger extensors  4/5   Finger extensors  4/5   Finger flexors  4/5   Finger flexors  4/5   Dorsal interossei  4/5   Dorsal interossei  4/5   Abductor pollicis  4/5   Abductor pollicis  4/5   Tone (Ashworth scale)  0  Tone (Ashworth scale)  0   Right Lower Extremity:    Left Lower Extremity:    Hip flexors  4+/5   Hip flexors  4+/5   Hip extensors  4/5   Hip extensors  4/5   Knee flexors  5/5   Knee flexors  5/5   Knee extensors  5/5   Knee extensors  5/5   Dorsiflexors  4/5   Dorsiflexors  4/5   Plantarflexors  5-/5   Plantarflexors  5-/5   Toe  extensors  5-/5   Toe extensors  5-/5   Toe flexors  5-/5   Toe flexors  5-/5   Tone (Ashworth scale)  0  Tone (Ashworth scale)  0   COORDINATION/GAIT:   Gait is normal.    Data:  Lab Results  Component Value Date   HGBA1C 5.7 01/09/2017   Lab Results  Component Value Date   TSH 1.500 09/11/2018   Lab Results  Component Value Date   VITAMINB12 400 01/09/2017    IMPRESSION/PLAN: 1.  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  Disease complicated by cataracts, cardiac arrhythmia s/p PPM, dyspnea, and dysphagia.  Overall, exam is stable without progression.  She is compliant with using bilateral AFO.  2.  Weight loss due to poor PO intake. Encouraged to start adding 2-3 cans of ensure daily  3.  Chronic daily headaches Start remeron 7.5mg  half tab daily in hopes this will alleviates headaches and increase appetite   Return to clinic in 6 months  Greater than 50% of this 25 minute visit was spent in counseling, explanation of diagnosis, planning of further management, and coordination of care.   Thank you for allowing me to participate in patient's care.  If I can answer any additional questions, I would be pleased to do so.    Sincerely,    Nailyn Dearinger K. Posey Pronto, DO

## 2019-06-23 NOTE — Patient Instructions (Addendum)
Encouraged add 2-3 cans of ensure daily  Start taking Remeron 7.5mg  half-tab at bedtime.  I will see you back in 6 months

## 2019-06-26 NOTE — Telephone Encounter (Signed)
Jacob Moores is out on vacation for the next month please call another rep if more information is needed  -[phone] ext [phone]

## 2019-06-27 NOTE — Telephone Encounter (Signed)
Paperwork has been given to PCP to be filled out.

## 2019-07-04 ENCOUNTER — Ambulatory Visit
Admission: RE | Admit: 2019-07-04 | Discharge: 2019-07-04 | Disposition: A | Discharge status: Home / Self Care | Payer: Medicare Other | Source: Ambulatory Visit | Attending: Cardiology | Admitting: Cardiology

## 2019-07-04 ENCOUNTER — Other Ambulatory Visit: Payer: Self-pay

## 2019-07-04 DIAGNOSIS — Z1231 Encounter for screening mammogram for malignant neoplasm of breast: Secondary | ICD-10-CM

## 2019-07-28 ENCOUNTER — Ambulatory Visit: Payer: Medicare Other | Admitting: Pharmacist

## 2019-08-04 ENCOUNTER — Ambulatory Visit: Payer: Medicare Other | Attending: Family Medicine | Admitting: Pharmacist

## 2019-08-04 ENCOUNTER — Other Ambulatory Visit: Payer: Self-pay

## 2019-08-04 DIAGNOSIS — Z23 Encounter for immunization: Secondary | ICD-10-CM

## 2019-08-04 NOTE — Progress Notes (Signed)
Patient presents for vaccination against strep pneumo and influenza per orders of Dr. Newlin. Consent given. Counseling provided. No contraindications exists. Vaccine administered without incident.   

## 2019-08-18 ENCOUNTER — Ambulatory Visit (INDEPENDENT_AMBULATORY_CARE_PROVIDER_SITE_OTHER): Payer: Medicare Other | Admitting: *Deleted

## 2019-08-18 DIAGNOSIS — Z950 Presence of cardiac pacemaker: Secondary | ICD-10-CM | POA: Diagnosis not present

## 2019-08-18 LAB — CUP PACEART REMOTE DEVICE CHECK
Battery Remaining Longevity: 114 mo
Battery Voltage: 3.01 V
Brady Statistic AP VP Percent: 11.16 %
Brady Statistic AP VS Percent: 38.17 %
Brady Statistic AS VP Percent: 39.53 %
Brady Statistic AS VS Percent: 11.14 %
Brady Statistic RA Percent Paced: 49.17 %
Brady Statistic RV Percent Paced: 50.7 %
Date Time Interrogation Session: 20201214005144
Implantable Lead Implant Date: 20180614
Implantable Lead Implant Date: 20180614
Implantable Lead Location: 753859
Implantable Lead Location: 753859
Implantable Lead Model: 3830
Implantable Lead Model: 5076
Implantable Pulse Generator Implant Date: 20180614
Lead Channel Impedance Value: 304 Ohm
Lead Channel Impedance Value: 361 Ohm
Lead Channel Impedance Value: 475 Ohm
Lead Channel Impedance Value: 589 Ohm
Lead Channel Pacing Threshold Amplitude: 0.625 V
Lead Channel Pacing Threshold Amplitude: 0.875 V
Lead Channel Pacing Threshold Pulse Width: 0.4 ms
Lead Channel Pacing Threshold Pulse Width: 0.4 ms
Lead Channel Sensing Intrinsic Amplitude: 3.25 mV
Lead Channel Sensing Intrinsic Amplitude: 3.25 mV
Lead Channel Sensing Intrinsic Amplitude: 3.5 mV
Lead Channel Sensing Intrinsic Amplitude: 3.5 mV
Lead Channel Setting Pacing Amplitude: 2 V
Lead Channel Setting Pacing Amplitude: 2 V
Lead Channel Setting Pacing Pulse Width: 1 ms
Lead Channel Setting Sensing Sensitivity: 2 mV

## 2019-09-04 ENCOUNTER — Ambulatory Visit: Payer: Medicare Other | Attending: Internal Medicine

## 2019-09-04 DIAGNOSIS — U071 COVID-19: Secondary | ICD-10-CM

## 2019-09-05 LAB — NOVEL CORONAVIRUS, NAA: SARS-CoV-2, NAA: NOT DETECTED

## 2019-09-15 ENCOUNTER — Encounter: Payer: Self-pay | Admitting: Gastroenterology

## 2019-09-15 ENCOUNTER — Other Ambulatory Visit (HOSPITAL_COMMUNITY)
Admission: RE | Admit: 2019-09-15 | Discharge: 2019-09-15 | Disposition: A | Discharge status: Home / Self Care | Payer: Medicare Other | Source: Ambulatory Visit | Attending: Family Medicine | Admitting: Family Medicine

## 2019-09-15 ENCOUNTER — Ambulatory Visit (HOSPITAL_BASED_OUTPATIENT_CLINIC_OR_DEPARTMENT_OTHER): Payer: Medicare Other | Admitting: Family Medicine

## 2019-09-15 ENCOUNTER — Encounter: Payer: Self-pay | Admitting: Family Medicine

## 2019-09-15 ENCOUNTER — Other Ambulatory Visit: Payer: Self-pay

## 2019-09-15 VITALS — BP 98/60 | HR 63 | Temp 98.2°F | Ht 63.0 in | Wt 101.0 lb

## 2019-09-15 DIAGNOSIS — Z79899 Other long term (current) drug therapy: Secondary | ICD-10-CM | POA: Insufficient documentation

## 2019-09-15 DIAGNOSIS — Z0000 Encounter for general adult medical examination without abnormal findings: Secondary | ICD-10-CM | POA: Insufficient documentation

## 2019-09-15 DIAGNOSIS — Z124 Encounter for screening for malignant neoplasm of cervix: Secondary | ICD-10-CM | POA: Diagnosis not present

## 2019-09-15 DIAGNOSIS — Z1151 Encounter for screening for human papillomavirus (HPV): Secondary | ICD-10-CM | POA: Diagnosis not present

## 2019-09-15 DIAGNOSIS — D649 Anemia, unspecified: Secondary | ICD-10-CM | POA: Insufficient documentation

## 2019-09-15 DIAGNOSIS — Z791 Long term (current) use of non-steroidal anti-inflammatories (NSAID): Secondary | ICD-10-CM | POA: Insufficient documentation

## 2019-09-15 DIAGNOSIS — Z1211 Encounter for screening for malignant neoplasm of colon: Secondary | ICD-10-CM

## 2019-09-15 DIAGNOSIS — Z87891 Personal history of nicotine dependence: Secondary | ICD-10-CM | POA: Insufficient documentation

## 2019-09-15 DIAGNOSIS — D573 Sickle-cell trait: Secondary | ICD-10-CM | POA: Insufficient documentation

## 2019-09-15 DIAGNOSIS — Z681 Body mass index (BMI) 19 or less, adult: Secondary | ICD-10-CM | POA: Insufficient documentation

## 2019-09-15 DIAGNOSIS — Z780 Asymptomatic menopausal state: Secondary | ICD-10-CM | POA: Insufficient documentation

## 2019-09-15 DIAGNOSIS — Z950 Presence of cardiac pacemaker: Secondary | ICD-10-CM | POA: Insufficient documentation

## 2019-09-15 DIAGNOSIS — F329 Major depressive disorder, single episode, unspecified: Secondary | ICD-10-CM | POA: Insufficient documentation

## 2019-09-15 DIAGNOSIS — R630 Anorexia: Secondary | ICD-10-CM | POA: Insufficient documentation

## 2019-09-15 MED ORDER — MIRTAZAPINE 15 MG PO TABS: 15.0000 mg | ORAL_TABLET | Freq: Every day | ORAL | 3 refills | Status: DC

## 2019-09-15 MED ORDER — ALBUTEROL SULFATE HFA 108 (90 BASE) MCG/ACT IN AERS: 2.0000 | INHALATION_SPRAY | RESPIRATORY_TRACT | 1 refills | Status: DC | PRN

## 2019-09-15 NOTE — Patient Instructions (Addendum)
  Cynthia Underwood , Thank you for taking time to come for your Medicare Wellness Visit. I appreciate your ongoing commitment to your health goals. Please review the following plan we discussed and let me know if I can assist you in the future.   These are the goals we discussed: Goals   None     This is a list of the screening recommended for you and due dates:  Health Maintenance  Topic Date Due  . Colon Cancer Screening  08/17/2019  . Pap Smear  01/02/2020  . Mammogram  07/03/2021  . Tetanus Vaccine  01/03/2028  . Flu Shot  Completed  . HIV Screening  Completed

## 2019-09-15 NOTE — Progress Notes (Signed)
Subjective:   Cynthia Underwood is a 49 y.o. female who presents for Medicare Annual (Subsequent) preventive examination.  Review of Systems:  General: negative for fever, weight loss, +appetite change (poor appetitie) Eyes: no visual symptoms. ENT: no ear symptoms, no sinus tenderness, no nasal congestion or sore throat. Neck: no pain  Respiratory: no wheezing, shortness of breath, cough Cardiovascular: no chest pain, no dyspnea on exertion, no pedal edema, no orthopnea. Gastrointestinal: no abdominal pain, no diarrhea, no constipation Genito-Urinary: no urinary frequency, no dysuria, no polyuria. Hematologic: no bruising Endocrine: no cold or heat intolerance Neurological: no headaches, no seizures, no tremors Musculoskeletal: weakness in muscles Skin: no pruritus, no rash. Psychological: no depression, no anxiety,          Objective:     Vitals: BP 98/60   Pulse 63   Temp 98.2 F (36.8 C) (Oral)   Ht 5\' 3"  (1.6 m)   Wt 101 lb (45.8 kg)   SpO2 99%   BMI 17.89 kg/m   Body mass index is 17.89 kg/m.  Advanced Directives 06/23/2019 08/27/2018 03/01/2017 02/15/2017 02/12/2017 02/04/2017 01/01/2017  Does Patient Have a Medical Advance Directive? No No No No No No No  Would patient like information on creating a medical advance directive? - - - No - Patient declined No - Patient declined - No - Patient declined    Tobacco Social History   Tobacco Use  Smoking Status Former Smoker  . Packs/day: 0.00  . Years: 28.00  . Pack years: 0.00  . Types: Cigarettes  . Quit date: 10/04/2016  . Years since quitting: 2.9  Smokeless Tobacco Never Used     Counseling given: Not Answered   Clinical Intake:  Pre-visit preparation completed: Yes  Pain : No/denies pain     Diabetes: No     Interpreter Needed?: No     Past Medical History:  Diagnosis Date  . Anemia   . Bifascicular block 10/02/2014  . Depression   . Excess ear wax   . Myotonic dystrophy, type 1 ([place])  dx'd ~ 2005   Genetically confirmed  . Presence of permanent cardiac pacemaker   . 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 Sickle cell trait ([place])   . Watery eyes    Left  . Weakness of both legs    Past Surgical History:  Procedure Laterality Date  . BREAST BIOPSY Right   . EYE SURGERY Left 2017   "related to blockage in my nose"  . EYE SURGERY Right 05/2019  . INSERT / REPLACE / REMOVE PACEMAKER  02/15/2017  . PACEMAKER IMPLANT N/A 02/15/2017   Procedure: Pacemaker Implant;  Surgeon: Deboraha Sprang, MD;  Location: [facility];  Service: Cardiovascular;  Laterality: N/A;   Family History  Problem Relation Age of Onset  . Cancer Father 22       Deceased  . Heart disease Father   . COPD Mother   . Diabetes Maternal Uncle   . Heart disease Maternal Uncle   . Heart disease Paternal Grandmother   . Diabetes Paternal Grandmother   . Hypertension Paternal Grandmother   . Neuromuscular disorder Maternal Aunt        Myotonic dystrophy  . Neuromuscular disorder Cousin        Myotonic dystrophy  . Neuromuscular disorder Sister        Myotonic dystrophy   Social History   Socioeconomic History  . Marital status: Single  Spouse name: Not on file  . Number of children: 0  . Years of education: 1.5 Collge  . Highest education level: Not on file  Occupational History  . Occupation: Unemployed    Employer: OTHER  Tobacco Use  . Smoking status: Former Smoker    Packs/day: 0.00    Years: 28.00    Pack years: 0.00    Types: Cigarettes    Quit date: 10/04/2016    Years since quitting: 2.9  . Smokeless tobacco: Never Used  Substance and Sexual Activity  . Alcohol use: Yes    Alcohol/week: 0.0 standard drinks    Comment: 02/15/2017 "maybe 6 times/year"  . Drug use: Yes    Frequency: 2.0 times per week    Types: Marijuana    Comment: 02/15/2017 "couple times/month"  . Sexual activity: Yes    Birth control/protection: None,  Condom  Other Topics Concern  . Not on file  Social History Narrative   Patient lives at home with her aunt.   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  She moved to [facility] in August 2015.   She has been on disability since 2010.   Education: 1 1/2 years of college.   Caffeine - coke 2 cans/day   Exercise - no   Social Determinants of Health   Financial Resource Strain:   . Difficulty of Paying Living Expenses: Not on file  Food Insecurity:   . Worried About Charity fundraiser in the Last Year: Not on file  . Ran Out of Food in the Last Year: Not on file  Transportation Needs:   . Lack of Transportation (Medical): Not on file  . Lack of Transportation (Non-Medical): Not on file  Physical Activity:   . Days of Exercise per Week: Not on file  . Minutes of Exercise per Session: Not on file  Stress:   . Feeling of Stress : Not on file  Social Connections:   . Frequency of Communication with Friends and Family: Not on file  . Frequency of Social Gatherings with Friends and Family: Not on file  . Attends Religious Services: Not on file  . Active Member of Clubs or Organizations: Not on file  . Attends Archivist Meetings: Not on file  . Marital Status: Not on file    Outpatient Encounter Medications as of 09/15/2019  Medication Sig  . albuterol (VENTOLIN HFA) 108 (90 Base) MCG/ACT inhaler Inhale 2 puffs into the lungs every 4 (four) hours as needed for wheezing or shortness of breath (or coughing).  Marland Kitchen atorvastatin (LIPITOR) 20 MG tablet TAKE 1 TABLET BY MOUTH EVERY DAY (Patient not taking: Reported on 09/15/2019)  . cetirizine (ZYRTEC) 10 MG tablet TAKE 1 TABLET BY MOUTH EVERY DAY (Patient not taking: Reported on 09/15/2019)  . hydrOXYzine (ATARAX/VISTARIL) 25 MG tablet Take 1 tablet (25 mg total) by mouth at bedtime. (Patient not taking: Reported on 09/15/2019)  . mirtazapine (REMERON) 15 MG tablet Take 1 tablet (15 mg total) by mouth at bedtime. Take 1/2  tablet daily at bedtime.  . naproxen (NAPROSYN) 500 MG tablet Take 1 tablet (500 mg total) by mouth every 12 (twelve) hours as needed for moderate pain. (Patient not taking: Reported on 09/15/2019)  . prednisoLONE acetate (PRED FORTE) 1 % ophthalmic suspension INSTILL 1 DROP IN THE RIGHT EYE THREE TIMES DAILY  . Vitamin D, Ergocalciferol, (DRISDOL) 1.25 MG (50000 UT) CAPS capsule TAKE 1 CAPSULE (50,000 UNITS TOTAL) BY MOUTH EVERY 7 (SEVEN)  DAYS. (Patient not taking: Reported on 09/15/2019)  . [DISCONTINUED] albuterol (PROVENTIL HFA;VENTOLIN HFA) 108 (90 Base) MCG/ACT inhaler Inhale 2 puffs into the lungs every 4 (four) hours as needed for wheezing or shortness of breath (or coughing). (Patient not taking: Reported on 09/15/2019)  . [DISCONTINUED] mirtazapine (REMERON) 7.5 MG tablet Take 1/2 tablet daily at bedtime. (Patient not taking: Reported on 06/23/2019)   No facility-administered encounter medications on file as of 09/15/2019.    Activities of Daily Living In your present state of health, do you have any difficulty performing the following activities: 09/15/2019  Hearing? N  Vision? N  Difficulty concentrating or making decisions? N  Walking or climbing stairs? Y  Comment due to her legs  Dressing or bathing? N  Doing errands, shopping? N  Some recent data might be hidden    Patient Care Team: Charlott Rakes, MD as PCP - General (Family Medicine) Alda Berthold, DO as Consulting Physician (Neurology)    Assessment:   This is a routine wellness examination for Doree.  Exercise Activities and Dietary recommendations    Goals   None     Fall Risk Fall Risk  06/23/2019 11/07/2018 09/03/2018 01/02/2018 07/16/2017  Falls in the past year? 1 0 0 No Yes  Number falls in past yr: 0 - - - 1  Injury with Fall? 0 - - - No  Risk Factor Category  - - - - -  Risk for fall due to : - - - - Other (Comment)  Follow up - - - - Falls evaluation completed;Education provided;Falls prevention  discussed   Is the patient's home free of loose throw rugs in walkways, pet beds, electrical cords, etc?   yes      Grab bars in the bathroom? no      Handrails on the stairs?   n/a      Adequate lighting?   yes  Timed Get Up and Go performed: yes  Depression Screen PHQ 2/9 Scores 09/15/2019 11/07/2018 09/11/2018 09/03/2018  PHQ - 2 Score 0 0 2 2  PHQ- 9 Score 7 8 10 11      Cognitive Function        Immunization History  Administered Date(s) Administered  . Influenza,inj,Quad PF,6+ Mos 08/11/2014, 07/01/2015, 08/04/2019  . Pneumococcal Polysaccharide-23 08/04/2019  . Tdap 01/02/2018    Qualifies for Shingles Vaccine?yes  Screening Tests Health Maintenance  Topic Date Due  . COLONOSCOPY  08/17/2019  . PAP SMEAR-Modifier  01/02/2020  . MAMMOGRAM  07/03/2021  . TETANUS/TDAP  01/03/2028  . INFLUENZA VACCINE  Completed  . HIV Screening  Completed    Cancer Screenings: Lung: Low Dose CT Chest recommended if Age 71-80 years, 30 pack-year currently smoking OR have quit w/in 15years. Patient does not qualify. Breast:  Up to date on Mammogram? Yes   Up to date of Bone Density/Dexa? No Colorectal: due - ordered today  Additional Screenings:  Hepatitis C Screening:      Plan:    1. Encounter for Medicare annual wellness exam Counseled on 150 minutes of exercise per week, healthy eating (including decreased daily intake of saturated fats, cholesterol, added sugars, sodium), routine healthcare maintenance.   2. Screening for colon cancer - Ambulatory referral to Gastroenterology  3. Poor appetite - mirtazapine (REMERON) 15 MG tablet; Take 1 tablet (15 mg total) by mouth at bedtime. Take 1/2 tablet daily at bedtime.  Dispense: 30 tablet; Refill: 3  4. Screening for cervical cancer - Cytology - PAP([place])  I have personally reviewed and noted the following in the patient's chart:   . Medical and social history . Use of alcohol, tobacco or illicit drugs   . Current medications and supplements . Functional ability and status . Nutritional status . Physical activity . Advanced directives . List of other physicians . Hospitalizations, surgeries, and ER visits in previous 12 months . Vitals . Screenings to include cognitive, depression, and falls . Referrals and appointments  In addition, I have reviewed and discussed with patient certain preventive protocols, quality metrics, and best practice recommendations. A written personalized care plan for preventive services as well as general preventive health recommendations were provided to patient.     Charlott Rakes, MD  09/15/2019

## 2019-09-17 LAB — CYTOLOGY - PAP
Adequacy: ABSENT
Comment: NEGATIVE
Diagnosis: NEGATIVE
Diagnosis: REACTIVE
High risk HPV: NEGATIVE

## 2019-09-18 ENCOUNTER — Telehealth: Payer: Self-pay

## 2019-09-18 NOTE — Telephone Encounter (Signed)
-----   Message from Charlott Rakes, MD sent at 09/18/2019 10:43 AM EST ----- PAP smear is negative for malignancy

## 2019-09-18 NOTE — Telephone Encounter (Signed)
Patient name and DOB has been verified Patient was informed of lab results. Patient had no questions.  

## 2019-09-20 NOTE — Progress Notes (Signed)
PPM remote 

## 2019-09-24 ENCOUNTER — Ambulatory Visit (AMBULATORY_SURGERY_CENTER): Payer: Self-pay | Admitting: *Deleted

## 2019-09-24 ENCOUNTER — Other Ambulatory Visit: Payer: Self-pay

## 2019-09-24 VITALS — Temp 98.0°F | Ht 63.0 in | Wt 103.0 lb

## 2019-09-24 DIAGNOSIS — Z01818 Encounter for other preprocedural examination: Secondary | ICD-10-CM

## 2019-09-24 DIAGNOSIS — Z1211 Encounter for screening for malignant neoplasm of colon: Secondary | ICD-10-CM

## 2019-09-24 MED ORDER — NA SULFATE-K SULFATE-MG SULF 17.5-3.13-1.6 GM/177ML PO SOLN: ORAL | 0 refills | Status: DC

## 2019-09-24 NOTE — Progress Notes (Signed)
Patient is here in-person for PV. Patient denies any allergies to eggs or soy. Patient denies any problems with anesthesia/sedation. Patient denies any oxygen use at home. Patient denies taking any diet/weight loss medications or blood thinners. Patient is not being treated for MRSA or C-diff. EMMI education assisgned to the patient for the procedure, this was explained and instructions given to patient. COVID-19 screening test is on 1/28, the pt is aware. Pt is aware that care partner will wait in the car during procedure; if they feel like they will be too hot or cold to wait in the car; they may wait in the 4 th floor lobby. Patient is aware to bring only one care partner. We want them to wear a mask (we do not have any that we can provide them), practice social distancing, and we will check their temperatures when they get here.  I did remind the patient that their care partner needs to stay in the parking lot the entire time and have a cell phone available, we will call them when the pt is ready for discharge. Patient will wear mask into building.   2day mir/suprep given to pt due to constipation.

## 2019-10-02 ENCOUNTER — Ambulatory Visit (INDEPENDENT_AMBULATORY_CARE_PROVIDER_SITE_OTHER): Payer: Medicare Other

## 2019-10-02 ENCOUNTER — Other Ambulatory Visit: Payer: Self-pay | Admitting: Gastroenterology

## 2019-10-02 DIAGNOSIS — Z1159 Encounter for screening for other viral diseases: Secondary | ICD-10-CM | POA: Diagnosis not present

## 2019-10-03 LAB — SARS CORONAVIRUS 2 (TAT 6-24 HRS): SARS Coronavirus 2: NEGATIVE

## 2019-10-07 ENCOUNTER — Encounter: Payer: Self-pay | Admitting: Gastroenterology

## 2019-10-07 ENCOUNTER — Ambulatory Visit (AMBULATORY_SURGERY_CENTER): Payer: Medicare Other | Admitting: Gastroenterology

## 2019-10-07 ENCOUNTER — Other Ambulatory Visit: Payer: Self-pay

## 2019-10-07 VITALS — BP 98/57 | HR 89 | Temp 97.5°F | Resp 20 | Ht 63.0 in | Wt 103.0 lb

## 2019-10-07 DIAGNOSIS — Z1211 Encounter for screening for malignant neoplasm of colon: Secondary | ICD-10-CM

## 2019-10-07 DIAGNOSIS — D122 Benign neoplasm of ascending colon: Secondary | ICD-10-CM

## 2019-10-07 DIAGNOSIS — D123 Benign neoplasm of transverse colon: Secondary | ICD-10-CM

## 2019-10-07 MED ORDER — SODIUM CHLORIDE 0.9 % IV SOLN: 500.0000 mL | Freq: Once | INTRAVENOUS | Status: DC

## 2019-10-07 NOTE — Patient Instructions (Signed)
Handouts provided on polyps and hemorrhoids.  YOU HAD AN ENDOSCOPIC PROCEDURE TODAY AT [facility]:   Refer to the procedure report that was given to you for any specific questions about what was found during the examination.  If the procedure report does not answer your questions, please call your gastroenterologist to clarify.  If you requested that your care partner not be given the details of your procedure findings, then the procedure report has been included in a sealed envelope for you to review at your convenience later.  YOU SHOULD EXPECT: Some feelings of bloating in the abdomen. Passage of more gas than usual.  Walking can help get rid of the air that was put into your GI tract during the procedure and reduce the bloating. If you had a lower endoscopy (such as a colonoscopy or flexible sigmoidoscopy) you may notice spotting of blood in your stool or on the toilet paper. If you underwent a bowel prep for your procedure, you may not have a normal bowel movement for a few days.  Please Note:  You might notice some irritation and congestion in your nose or some drainage.  This is from the oxygen used during your procedure.  There is no need for concern and it should clear up in a day or so.  SYMPTOMS TO REPORT IMMEDIATELY:   Following lower endoscopy (colonoscopy or flexible sigmoidoscopy):  Excessive amounts of blood in the stool  Significant tenderness or worsening of abdominal pains  Swelling of the abdomen that is new, acute  Fever of 100F or higher   For urgent or emergent issues, a gastroenterologist can be reached at any hour by calling [phone].   DIET:  We do recommend a small meal at first, but then you may proceed to your regular diet.  Drink plenty of fluids but you should avoid alcoholic beverages for 24 hours.  ACTIVITY:  You should plan to take it easy for the rest of today and you should NOT DRIVE or use heavy machinery until tomorrow (because of  the sedation medicines used during the test).    FOLLOW UP: Our staff will call the number listed on your records 48-72 hours following your procedure to check on you and address any questions or concerns that you may have regarding the information given to you following your procedure. If we do not reach you, we will leave a message.  We will attempt to reach you two times.  During this call, we will ask if you have developed any symptoms of COVID 19. If you develop any symptoms (ie: fever, flu-like symptoms, shortness of breath, cough etc.) before then, please call [phone].  If you test positive for Covid 19 in the 2 weeks post procedure, please call and report this information to [place].    If any biopsies were taken you will be contacted by phone or by letter within the next 1-3 weeks.  Please call us at [phone] if you have not heard about the biopsies in 3 weeks.    SIGNATURES/CONFIDENTIALITY: You and/or your care partner have signed paperwork which will be entered into your electronic medical record.  These signatures attest to the fact that that the information above on your After Visit Summary has been reviewed and is understood.  Full responsibility of the confidentiality of this discharge information lies with you and/or your care-partner.

## 2019-10-07 NOTE — Progress Notes (Signed)
Report given to PACU, vss 

## 2019-10-07 NOTE — Op Note (Signed)
[facility] Patient Name: Cynthia Underwood Procedure Date: 10/07/2019 8:32 AM MRN: [id number] Endoscopist: Mauri Pole , MD Age: 49 Referring MD:  Date of Birth: Jun 12, 1971 Gender: Female Account #: [id number] Procedure:                Colonoscopy Indications:              Screening for colorectal malignant neoplasm Medicines:                Monitored Anesthesia Care Procedure:                Pre-Anesthesia Assessment:                           - Prior to the procedure, a History and Physical                            was performed, and patient medications and                            allergies were reviewed. The patient's tolerance of                            previous anesthesia was also reviewed. The risks                            and benefits of the procedure and the sedation                            options and risks were discussed with the patient.                            All questions were answered, and informed consent                            was obtained. Prior Anticoagulants: The patient has                            taken no previous anticoagulant or antiplatelet                            agents. ASA Grade Assessment: III - A patient with                            severe systemic disease. After reviewing the risks                            and benefits, the patient was deemed in                            satisfactory condition to undergo the procedure.                           After obtaining informed consent, the colonoscope  was passed under direct vision. Throughout the                            procedure, the patient's blood pressure, pulse, and                            oxygen saturations were monitored continuously. The                            Colonoscope was introduced through the anus and                            advanced to the the cecum, identified by                            appendiceal  orifice and ileocecal valve. The                            colonoscopy was performed without difficulty. The                            patient tolerated the procedure well. The quality                            of the bowel preparation was excellent. The                            ileocecal valve, appendiceal orifice, and rectum                            were photographed. Scope In: 8:42:04 AM Scope Out: 8:54:45 AM Scope Withdrawal Time: 0 hours 6 minutes 56 seconds  Total Procedure Duration: 0 hours 12 minutes 41 seconds  Findings:                 The perianal and digital rectal examinations were                            normal.                           Two sessile polyps were found in the transverse                            colon and ascending colon. The polyps were 1 to 2                            mm in size. These polyps were removed with a cold                            biopsy forceps. Resection and retrieval were                            complete.  A 10 mm polyp was found in the ascending colon. The                            polyp was sessile. The polyp was removed with a                            cold snare. Resection and retrieval were complete.                           Non-bleeding internal hemorrhoids were found during                            retroflexion. The hemorrhoids were medium-sized. Complications:            No immediate complications. Estimated Blood Loss:     Estimated blood loss was minimal. Impression:               - Two 1 to 2 mm polyps in the transverse colon and                            in the ascending colon, removed with a cold biopsy                            forceps. Resected and retrieved.                           - One 10 mm polyp in the ascending colon, removed                            with a cold snare. Resected and retrieved.                           - Non-bleeding internal hemorrhoids. Recommendation:            - Patient has a contact number available for                            emergencies. The signs and symptoms of potential                            delayed complications were discussed with the                            patient. Return to normal activities tomorrow.                            Written discharge instructions were provided to the                            patient.                           - Resume previous diet.                           -  Continue present medications.                           - Await pathology results.                           - Repeat colonoscopy in 3 - 5 years for                            surveillance based on pathology results. Mauri Pole, MD 10/07/2019 9:02:39 AM This report has been signed electronically.

## 2019-10-07 NOTE — Progress Notes (Signed)
Called to room to assist during endoscopic procedure.  Patient ID and intended procedure confirmed with present staff. Received instructions for my participation in the procedure from the performing physician.  

## 2019-10-09 ENCOUNTER — Telehealth: Payer: Self-pay

## 2019-10-09 MED ORDER — HYDROCORTISONE (PERIANAL) 2.5 % EX CREA: 1.0000 "application " | TOPICAL_CREAM | Freq: Two times a day (BID) | CUTANEOUS | 1 refills | Status: DC | PRN

## 2019-10-09 NOTE — Telephone Encounter (Signed)
  Follow up Call-  Call back number 10/07/2019  Post procedure Call Back phone  # [phone]  Permission to leave phone message Yes  Some recent data might be hidden     Patient questions:  Do you have a fever, pain , or abdominal swelling? Yes Pain Score  4 patient c/o hemorrhoid irritation and painful during BM.  Have you tolerated food without any problems? Yes.    Have you been able to return to your normal activities? Yes.    Do you have any questions about your discharge instructions: Diet   No. Medications  No. Follow up visit  No.  Do you have questions or concerns about your Care? No.  Actions: * If pain score is 4 or above: Physician/ provider Notified : Harl Bowie, MD. 10/09/19 @ 1320  1. Have you developed a fever since your procedure? no  2.   Have you had an respiratory symptoms (SOB or cough) since your procedure? no  3.   Have you tested positive for COVID 19 since your procedure no  4.   Have you had any family members/close contacts diagnosed with the COVID 19 since your procedure?  no   If yes to any of these questions please route to Joylene John, RN and Alphonsa Gin, Therapist, sports.

## 2019-10-09 NOTE — Telephone Encounter (Signed)
Message sent to [name], MD pertaining to patient questions.

## 2019-10-09 NOTE — Addendum Note (Signed)
Addended by: Samantha Crimes on: 10/09/2019 01:36 PM   Modules accepted: Orders

## 2019-10-09 NOTE — Telephone Encounter (Signed)
Called patient with recommendations [name], MD had provided.  Order placed for hydrocortisone cream and relayed instructions to patient.  Patient verbalized understanding.

## 2019-10-09 NOTE — Telephone Encounter (Signed)
1st follow up call made.  NAULM 

## 2019-10-09 NOTE — Telephone Encounter (Signed)
Please send Rx for Hydrocortisone HC, advise patient to apply small pea size amount per rectum as needed BID, if continues to have issues with hemorrhoids, call to schedule follow up visit for hemorrhoidal band ligation.  Thanks

## 2019-10-10 ENCOUNTER — Encounter: Payer: Self-pay | Admitting: Gastroenterology

## 2019-10-14 ENCOUNTER — Other Ambulatory Visit: Payer: Self-pay | Admitting: Family Medicine

## 2019-10-14 DIAGNOSIS — R630 Anorexia: Secondary | ICD-10-CM

## 2019-11-17 ENCOUNTER — Ambulatory Visit (INDEPENDENT_AMBULATORY_CARE_PROVIDER_SITE_OTHER): Payer: Medicare Other | Admitting: *Deleted

## 2019-11-17 DIAGNOSIS — Z950 Presence of cardiac pacemaker: Secondary | ICD-10-CM

## 2019-11-18 ENCOUNTER — Other Ambulatory Visit: Payer: Self-pay | Admitting: Family Medicine

## 2019-11-19 ENCOUNTER — Telehealth: Payer: Self-pay

## 2019-11-19 LAB — CUP PACEART REMOTE DEVICE CHECK
Battery Remaining Longevity: 112 mo
Battery Voltage: 3.01 V
Brady Statistic AP VP Percent: 5.18 %
Brady Statistic AP VS Percent: 25.18 %
Brady Statistic AS VP Percent: 46.5 %
Brady Statistic AS VS Percent: 23.14 %
Brady Statistic RA Percent Paced: 30.17 %
Brady Statistic RV Percent Paced: 51.69 %
Date Time Interrogation Session: 20210317160019
Implantable Lead Implant Date: 20180614
Implantable Lead Implant Date: 20180614
Implantable Lead Location: 753859
Implantable Lead Location: 753859
Implantable Lead Model: 3830
Implantable Lead Model: 5076
Implantable Pulse Generator Implant Date: 20180614
Lead Channel Impedance Value: 323 Ohm
Lead Channel Impedance Value: 361 Ohm
Lead Channel Impedance Value: 494 Ohm
Lead Channel Impedance Value: 608 Ohm
Lead Channel Pacing Threshold Amplitude: 0.625 V
Lead Channel Pacing Threshold Amplitude: 1.125 V
Lead Channel Pacing Threshold Pulse Width: 0.4 ms
Lead Channel Pacing Threshold Pulse Width: 0.4 ms
Lead Channel Sensing Intrinsic Amplitude: 3 mV
Lead Channel Sensing Intrinsic Amplitude: 3 mV
Lead Channel Sensing Intrinsic Amplitude: 4 mV
Lead Channel Sensing Intrinsic Amplitude: 4 mV
Lead Channel Setting Pacing Amplitude: 2 V
Lead Channel Setting Pacing Amplitude: 2 V
Lead Channel Setting Pacing Pulse Width: 1 ms
Lead Channel Setting Sensing Sensitivity: 2 mV

## 2019-11-19 NOTE — Telephone Encounter (Signed)
Left message for patient to remind of missed remote transmission.  

## 2019-11-20 DIAGNOSIS — Z833 Family history of diabetes mellitus: Secondary | ICD-10-CM | POA: Diagnosis not present

## 2019-11-20 DIAGNOSIS — Z808 Family history of malignant neoplasm of other organs or systems: Secondary | ICD-10-CM | POA: Diagnosis not present

## 2019-11-20 DIAGNOSIS — Z8679 Personal history of other diseases of the circulatory system: Secondary | ICD-10-CM | POA: Diagnosis not present

## 2019-11-20 DIAGNOSIS — Z8709 Personal history of other diseases of the respiratory system: Secondary | ICD-10-CM | POA: Diagnosis not present

## 2019-11-20 DIAGNOSIS — Z8249 Family history of ischemic heart disease and other diseases of the circulatory system: Secondary | ICD-10-CM | POA: Diagnosis not present

## 2019-11-20 NOTE — Progress Notes (Signed)
PPM Remote  

## 2019-12-22 ENCOUNTER — Other Ambulatory Visit: Payer: Self-pay

## 2019-12-22 ENCOUNTER — Encounter: Payer: Self-pay | Admitting: Neurology

## 2019-12-22 ENCOUNTER — Ambulatory Visit (INDEPENDENT_AMBULATORY_CARE_PROVIDER_SITE_OTHER): Payer: Medicare Other | Admitting: Neurology

## 2019-12-22 VITALS — BP 99/65 | HR 95 | Resp 18 | Ht 63.0 in | Wt 111.0 lb

## 2019-12-22 DIAGNOSIS — R519 Headache, unspecified: Secondary | ICD-10-CM | POA: Diagnosis not present

## 2019-12-22 DIAGNOSIS — G7111 Myotonic muscular dystrophy: Secondary | ICD-10-CM | POA: Diagnosis not present

## 2019-12-22 NOTE — Progress Notes (Signed)
Follow-up Visit   Date: 12/22/19    Cynthia Underwood MRN: [id number] DOB: 07-04-1971   Interim History: Cynthia Underwood is a 49 y.o. 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  The patient was accompanied to the clinic by self.  History of present illness: Starting around the age of 72, she started having lock jaw and stiffness of the hands. Over the years, she developed worsening stiffness of her fingers and hands. Because her maternal aunt, who is also a patient of mine, had known myotonic dystrophy, she ultimately was genetically tested at the age of 27 which confirmed the diagnosis of myotonic dystrophy type 1. She has a strong family history of DM1 including maternal aunt, cousins x 2, and younger sister. She does not have any children and has no future plans for pregnancy.  Symptoms were relatively stable until her mid-30s and then started developing fatigue, weakness, daytime sleepiness, and shortness of breath with exertion. She walks independently but was told previously told to use leg braces.  Over the past few years, she noticed intermittent difficulty swallowing liquids. She underwent barium swallow which reportedly showed signs of aspiration and she was recommended to use a straw and use chin tuck position.   She was seeing several neurologists over the years at [facility] in [place]. She is not working and has been on disability since 2010.  She established care with me in February 2016.   In fall of 2016, she had to move her bedroom to the [place] because of difficulty climbing stairs.  Since completing home PT/OT, her muscle strength has improved and sometimes, she is able to walk without AFO.  By 2017, she was able to climb stairs and went back to sleeping upstairs. She was started in  Cymbalta 30mg  for mood, fatigue and myalgias which significantly helped, but self-discontinued this in 2018 due to feeling it was ineffective.    She followed by cardiology for abnormal EKG with RBBB and LAFB as well as myocardial thinning.  In 2018, she had dual chamber PPM implanted by Dr. Caryl Comes.  She is also undergoing pulmonology work-up for dyspnea, which is  suggestive of restrictive changes, consistent with muscular weakness due to DM1.    UPDATE 12/22/2019:  She is here for follow-up visit.  She is requesting a letter to be excused from jury duty.  Her headaches have resolved since restarting Remeron 7.5mg  and weight has increased by 5lb.  She reports having bilateral leg soreness since doing an intense exercise program over the weekend, but the soreness is improving.  No new weakness or changes in speech/swallow.  She continues to have good days and bad days.  She is no longer using her AFO because of discomfort in the feet.    Medications:  Current Outpatient Medications on File Prior to Visit  Medication Sig Dispense Refill  . albuterol (VENTOLIN HFA) 108 (90 Base) MCG/ACT inhaler INHALE 2 PUFFS INTO THE LUNGS EVERY 4 HOURS AS NEEDED FOR WHEEZING OR SHORTNESS OF BREATH 18 g 1  . hydrocortisone (ANUSOL-HC) 2.5 % rectal cream Place 1 application rectally 2 (two) times daily as needed for hemorrhoids or anal itching. 30 g 1  . mirtazapine (REMERON) 15 MG tablet TAKE 1/2 TO 1 TABLET (15 MG TOTAL) BY MOUTH AT BEDTIME. 90 tablet 1   No current facility-administered medications on  file prior to visit.    Allergies:  Allergies  Allergen Reactions  . Penicillin G Other (See Comments), Rash and Nausea And Vomiting    Sore throat  . Penicillins Nausea And Vomiting, Rash and Other (See Comments)    Fever Has patient had a PCN reaction causing immediate rash, facial/tongue/throat swelling, SOB or lightheadedness with hypotension: Yes Has patient had a PCN reaction causing severe rash involving  mucus membranes or skin necrosis: Unknown Has patient had a PCN reaction that required hospitalization: No Has patient had a PCN reaction occurring within the last 10 years: No If all of the above answers are "NO", then may proceed with Cephalosporin use.     Vital Signs:  BP 99/65   Pulse 95   Resp 18   Ht 5\' 3"  (1.6 m)   Wt 111 lb (50.3 kg)   LMP 12/07/2016   SpO2 98%   BMI 19.66 kg/m   Neurological Exam: MENTAL STATUS including orientation to time, place, person, recent and remote memory, attention span and concentration, language, and fund of knowledge is normal.  Speech is mildly dysarthric and hypophonic.    CRANIAL NERVES: Pupils equal round and reactive to light.. Normal conjugate, extra-ocular eye movements in all directions of gaze. Mild bilateral ptosis. Typical mytonic facies with temporal wasting and transverse smile.   MOTOR:  Generalized loss of muscle bulk. Mild grip myotonia bilaterally. Right Upper Extremity:    Left Upper Extremity:    Deltoid  5-/5   Deltoid  5-/5   Biceps  5-/5   Biceps  5-/5   Triceps  5-/5   Triceps  5-/5   Wrist extensors  5/5   Wrist extensors  5/5   Wrist flexors  5/5   Wrist flexors  5/5   Finger extensors  4/5   Finger extensors  4/5   Finger flexors  4/5   Finger flexors  4/5   Dorsal interossei  4/5   Dorsal interossei  4/5   Abductor pollicis  4/5   Abductor pollicis  4/5   Tone (Ashworth scale)  0  Tone (Ashworth scale)  0   Right Lower Extremity:    Left Lower Extremity:    Hip flexors  4+/5   Hip flexors  4+/5   Hip extensors  4/5   Hip extensors  4/5   Knee flexors  5/5   Knee flexors  5/5   Knee extensors  5/5   Knee extensors  5/5   Dorsiflexors  4/5   Dorsiflexors  4/5   Plantarflexors  5-/5   Plantarflexors  5-/5   Tone (Ashworth scale)  0  Tone (Ashworth scale)  0   COORDINATION/GAIT:   Gait is stable, trace dragging of the feet bilaterally.    Data: N/a  IMPRESSION/PLAN: 1.  Myotonic dystrophy type I,  genetically confirmed with strong family history.  Disease complicated by cataracts, cardiac arrhythmia s/p PPM, dyspnea, and dysphagia.  Clinically stable. Continue to monitor. Fall precautions discussed, she is not wearing AFO due to discomfort.  Recommend she return to orthotics to discuss options  2.  Chronic daily headaches, improved on remeron 7.5mg  daily  3.  Myalgia s/p exercise Increase hydration Recommend that she slowly build up on her exercise regimen as she is deconditioned  4.  Letter provided to be excused from jury [employer]  Return to clinic in 9 months  Time spent:  30 min  Thank you for allowing me to participate in patient's care.  If I can answer any additional questions, I would be pleased to do so.    Sincerely,    Piya Mesch K. Posey Pronto, DO

## 2020-01-21 ENCOUNTER — Telehealth: Payer: Self-pay | Admitting: Family Medicine

## 2020-01-21 NOTE — Telephone Encounter (Signed)
Will route to PCP for review. 

## 2020-01-21 NOTE — Telephone Encounter (Signed)
Patient called in and wanted to know if it was okay for her to get the covid vaccine with her current heath conditions. Please follow up at your earliest convenience.

## 2020-01-22 ENCOUNTER — Telehealth: Payer: Self-pay | Admitting: Neurology

## 2020-01-22 NOTE — Telephone Encounter (Signed)
yes

## 2020-01-22 NOTE — Telephone Encounter (Signed)
Is this patient okay to take Covid vaccine?

## 2020-01-22 NOTE — Telephone Encounter (Signed)
Pt called

## 2020-01-22 NOTE — Telephone Encounter (Signed)
The following message was left with [employer] on 01/22/20 at 12:12 PM.  Caller states she needs to speak with someone that can tell her if she can take the covid shot.

## 2020-01-22 NOTE — Telephone Encounter (Signed)
Patient was called and informed to contact Dr.Patel to discuss COVID vaccine.

## 2020-01-22 NOTE — Telephone Encounter (Signed)
I will need her to check with her Neurologist Dr. Posey Pronto due to her history of myotonic dystrophy.

## 2020-02-16 ENCOUNTER — Ambulatory Visit (INDEPENDENT_AMBULATORY_CARE_PROVIDER_SITE_OTHER): Payer: Medicare Other | Admitting: *Deleted

## 2020-02-16 DIAGNOSIS — R001 Bradycardia, unspecified: Secondary | ICD-10-CM | POA: Diagnosis not present

## 2020-02-18 LAB — CUP PACEART REMOTE DEVICE CHECK
Battery Remaining Longevity: 109 mo
Battery Voltage: 3.01 V
Brady Statistic AP VP Percent: 1.98 %
Brady Statistic AP VS Percent: 43.4 %
Brady Statistic AS VP Percent: 23.65 %
Brady Statistic AS VS Percent: 30.97 %
Brady Statistic RA Percent Paced: 45.37 %
Brady Statistic RV Percent Paced: 25.64 %
Date Time Interrogation Session: 20210615201347
Implantable Lead Implant Date: 20180614
Implantable Lead Implant Date: 20180614
Implantable Lead Location: 753859
Implantable Lead Location: 753859
Implantable Lead Model: 3830
Implantable Lead Model: 5076
Implantable Pulse Generator Implant Date: 20180614
Lead Channel Impedance Value: 304 Ohm
Lead Channel Impedance Value: 361 Ohm
Lead Channel Impedance Value: 475 Ohm
Lead Channel Impedance Value: 589 Ohm
Lead Channel Pacing Threshold Amplitude: 0.75 V
Lead Channel Pacing Threshold Amplitude: 1 V
Lead Channel Pacing Threshold Pulse Width: 0.4 ms
Lead Channel Pacing Threshold Pulse Width: 0.4 ms
Lead Channel Sensing Intrinsic Amplitude: 3.125 mV
Lead Channel Sensing Intrinsic Amplitude: 3.125 mV
Lead Channel Sensing Intrinsic Amplitude: 4.5 mV
Lead Channel Sensing Intrinsic Amplitude: 4.5 mV
Lead Channel Setting Pacing Amplitude: 2 V
Lead Channel Setting Pacing Amplitude: 2 V
Lead Channel Setting Pacing Pulse Width: 1 ms
Lead Channel Setting Sensing Sensitivity: 2 mV

## 2020-02-18 NOTE — Progress Notes (Signed)
Remote pacemaker transmission.   

## 2020-03-10 ENCOUNTER — Telehealth: Payer: Self-pay

## 2020-03-10 DIAGNOSIS — I495 Sick sinus syndrome: Secondary | ICD-10-CM | POA: Insufficient documentation

## 2020-03-10 NOTE — Telephone Encounter (Signed)
Called to update tele consent left message asking pt to call the office.

## 2020-03-11 ENCOUNTER — Telehealth (INDEPENDENT_AMBULATORY_CARE_PROVIDER_SITE_OTHER): Payer: Medicare Other | Admitting: Internal Medicine

## 2020-03-11 ENCOUNTER — Other Ambulatory Visit: Payer: Self-pay

## 2020-03-11 VITALS — Ht 63.0 in | Wt 110.0 lb

## 2020-03-11 DIAGNOSIS — I441 Atrioventricular block, second degree: Secondary | ICD-10-CM

## 2020-03-11 DIAGNOSIS — I495 Sick sinus syndrome: Secondary | ICD-10-CM

## 2020-03-11 DIAGNOSIS — G7111 Myotonic muscular dystrophy: Secondary | ICD-10-CM | POA: Diagnosis not present

## 2020-03-11 DIAGNOSIS — Z950 Presence of cardiac pacemaker: Secondary | ICD-10-CM

## 2020-03-11 NOTE — Progress Notes (Signed)
Electrophysiology TeleHealth Note   Due to national recommendations of social distancing due to COVID 19, an audio/video telehealth visit is felt to be most appropriate for this patient at this time.  See MyChart message from today for the patient's consent to telehealth for [facility]   Date:  03/11/2020   ID:  Cynthia Underwood, DOB March 10, 1971, MRN [id number]  Location: patient's home  Provider location: [address], [place]  Evaluation Performed: Follow-up visit  PCP:  Charlott Rakes, MD  Cardiologist:    TT Electrophysiologist:  SK   Chief Complaint:  Heart block and myotonic dystrophy  History of Present Illness:    Cynthia Underwood is a 49 y.o. female who presents via audio/video conferencing for a telehealth visit today. The patient did not have access to video technology/had technical difficulties with video requiring transitioning to audio format only (telephone).  All issues noted in this document were discussed and addressed.  No physical exam could be performed with this format.     Since last being seen in our clinic for His Bundle pacing for high grade block in the setting of myotonic dystrophy -DM 1 she reports she has been doing well  Some good days and bad days but no progression of weakness  The patient denies chest pain, shortness of breath, nocturnal dyspnea, orthopnea or peripheral edema.  There have been no palpitations, lightheadedness or syncope.      Underwent pacing in 2018  The patient denies symptoms of fevers, chills, cough, or new SOB worrisome for COVID 19.    Past Medical History:  Diagnosis Date  . Anemia   . Bifascicular block 10/02/2014  . Depression   . Excess ear wax   . Myotonic dystrophy, type 1 ([place]) dx'd ~ 2005   Genetically confirmed  . Presence of permanent cardiac pacemaker   . 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 Sickle cell trait ([place])   . Watery eyes    Left    . Weakness of both legs     Past Surgical History:  Procedure Laterality Date  . BREAST BIOPSY Right   . EYE SURGERY Left 2017   "related to blockage in my nose"  . EYE SURGERY Right 05/2019  . INSERT / REPLACE / REMOVE PACEMAKER  02/15/2017  . PACEMAKER IMPLANT N/A 02/15/2017   Procedure: Pacemaker Implant;  Surgeon: Deboraha Sprang, MD;  Location: [facility];  Service: Cardiovascular;  Laterality: N/A;    Current Outpatient Medications  Medication Sig Dispense Refill  . albuterol (VENTOLIN HFA) 108 (90 Base) MCG/ACT inhaler INHALE 2 PUFFS INTO THE LUNGS EVERY 4 HOURS AS NEEDED FOR WHEEZING OR SHORTNESS OF BREATH 18 g 1  . hydrocortisone (ANUSOL-HC) 2.5 % rectal cream Place 1 application rectally 2 (two) times daily as needed for hemorrhoids or anal itching. 30 g 1  . mirtazapine (REMERON) 15 MG tablet TAKE 1/2 TO 1 TABLET (15 MG TOTAL) BY MOUTH AT BEDTIME. 90 tablet 1   No current facility-administered medications for this visit.    Allergies:   Penicillin g and Penicillins   Social History:  The patient  reports that she has been smoking cigarettes. She has been smoking about 0.00 packs per day for the past 28.00 years. She has never used smokeless tobacco. She reports previous alcohol use. She reports current drug use. Drug: Marijuana.   Family History:  The patient's   family history includes  COPD in her mother; Cancer (age of onset: 34) in her father; Diabetes in her maternal uncle and paternal grandmother; Heart disease in her father, maternal uncle, and paternal grandmother; Hypertension in her paternal grandmother; Neuromuscular disorder in her cousin, maternal aunt, and sister.   ROS:  Please see the history of present illness.   All other systems are personally reviewed and negative.    Exam:    Vital Signs:  Ht 5\' 3"  (1.6 m)   Wt 110 lb (49.9 kg)   LMP 12/07/2016   BMI 19.49 kg/m      Labs/Other Tests and Data Reviewed:    Recent Labs: No results found  for requested labs within last 8760 hours.   Wt Readings from Last 3 Encounters:  03/11/20 110 lb (49.9 kg)  12/22/19 111 lb (50.3 kg)  10/07/19 103 lb (46.7 kg)     Other studies personally reviewed: Additional studies/ records that were reviewed today include:As above     Last device remote is reviewed from [facility] PDF dated 6/21* which reveals normal device function,   arrhythmias - false positive SVT detection 2/2 double counting   ASSESSMENT & PLAN:    Myotonic dystrophy  Sinus node dysfunction  MBZ 2 AV block  Pacer Medtronic HIS Bundle with septal capture     Stable     Normal device function       COVID 19 screen The patient denies symptoms of COVID 19 at this time.  The importance of social distancing was discussed today.  Follow-up:   11m  Current medicines are reviewed at length with the patient today.   The patient does not have concerns regarding her medicines.  The following changes were made today:  none  Labs/ tests ordered today include:   No orders of the defined types were placed in this encounter.   Future tests ( post COVID )     Patient Risk:  after full review of this patients clinical status, I feel that they are at moderate risk at this time.  Today, I have spent 7 minutes with the patient with telehealth technology discussing the above.  Signed, Virl Axe, MD  03/11/2020 2:17 PM     [address] [place] [address] [phone] (office) [phone] (fax)

## 2020-03-12 NOTE — Patient Instructions (Addendum)
Medication Instructions:  Your physician recommends that you continue on your current medications as directed. Please refer to the Current Medication list given to you today.  Labwork: None ordered.  Testing/Procedures: None ordered.  Follow-Up: Your physician wants you to follow-up in: 12 months with Dr Caryl Comes. You will receive a reminder letter in the mail two months in advance. If you don't receive a letter, please call our office to schedule the follow-up appointment.  Remote monitoring is used to monitor your Pacemaker of ICD from home. This monitoring reduces the number of office visits required to check your device to one time per year. It allows [place] to keep an eye on the functioning of your device to ensure it is working properly.   Any Other Special Instructions Will Be Listed Below (If Applicable).  AVS mailed to pt 03/12/2020  If you need a refill on your cardiac medications before your next appointment, please call your pharmacy.

## 2020-05-17 ENCOUNTER — Ambulatory Visit (INDEPENDENT_AMBULATORY_CARE_PROVIDER_SITE_OTHER): Payer: Medicare Other | Admitting: *Deleted

## 2020-05-17 DIAGNOSIS — I441 Atrioventricular block, second degree: Secondary | ICD-10-CM

## 2020-05-19 LAB — CUP PACEART REMOTE DEVICE CHECK
Battery Remaining Longevity: 107 mo
Battery Voltage: 3.01 V
Brady Statistic AP VP Percent: 0.26 %
Brady Statistic AP VS Percent: 60.76 %
Brady Statistic AS VP Percent: 7.57 %
Brady Statistic AS VS Percent: 31.41 %
Brady Statistic RA Percent Paced: 61.07 %
Brady Statistic RV Percent Paced: 7.83 %
Date Time Interrogation Session: 20210915023137
Implantable Lead Implant Date: 20180614
Implantable Lead Implant Date: 20180614
Implantable Lead Location: 753859
Implantable Lead Location: 753859
Implantable Lead Model: 3830
Implantable Lead Model: 5076
Implantable Pulse Generator Implant Date: 20180614
Lead Channel Impedance Value: 304 Ohm
Lead Channel Impedance Value: 361 Ohm
Lead Channel Impedance Value: 475 Ohm
Lead Channel Impedance Value: 589 Ohm
Lead Channel Pacing Threshold Amplitude: 0.75 V
Lead Channel Pacing Threshold Amplitude: 0.875 V
Lead Channel Pacing Threshold Pulse Width: 0.4 ms
Lead Channel Pacing Threshold Pulse Width: 0.4 ms
Lead Channel Sensing Intrinsic Amplitude: 4.625 mV
Lead Channel Sensing Intrinsic Amplitude: 4.625 mV
Lead Channel Sensing Intrinsic Amplitude: 8 mV
Lead Channel Sensing Intrinsic Amplitude: 8 mV
Lead Channel Setting Pacing Amplitude: 2 V
Lead Channel Setting Pacing Amplitude: 2 V
Lead Channel Setting Pacing Pulse Width: 1 ms
Lead Channel Setting Sensing Sensitivity: 2 mV

## 2020-05-19 NOTE — Progress Notes (Signed)
Remote pacemaker transmission.   

## 2020-06-04 ENCOUNTER — Other Ambulatory Visit: Payer: Self-pay | Admitting: Cardiology

## 2020-06-04 DIAGNOSIS — Z1231 Encounter for screening mammogram for malignant neoplasm of breast: Secondary | ICD-10-CM

## 2020-07-05 ENCOUNTER — Telehealth: Payer: Self-pay | Admitting: Family Medicine

## 2020-07-05 NOTE — Telephone Encounter (Signed)
Ulice Dash from [facility] is calling to verify that forms where received by fax for cardiac genetic testing . Please advise CB- [phone]

## 2020-07-06 ENCOUNTER — Ambulatory Visit
Admission: RE | Admit: 2020-07-06 | Discharge: 2020-07-06 | Disposition: A | Discharge status: Home / Self Care | Payer: Medicare Other | Source: Ambulatory Visit | Attending: Cardiology | Admitting: Cardiology

## 2020-07-06 ENCOUNTER — Other Ambulatory Visit: Payer: Self-pay

## 2020-07-06 DIAGNOSIS — Z1231 Encounter for screening mammogram for malignant neoplasm of breast: Secondary | ICD-10-CM

## 2020-07-07 NOTE — Telephone Encounter (Signed)
Cynthia Underwood called to report that this is a time sensitive request and that he needs to receive this today if possible. Please advise

## 2020-07-07 NOTE — Telephone Encounter (Signed)
That needs to be sent to her Cardiologist.

## 2020-07-07 NOTE — Telephone Encounter (Signed)
Can we fill this form out or does her heart doctor need to fill out

## 2020-07-08 NOTE — Telephone Encounter (Signed)
Paperwork has been faxed over to heart doctors office.

## 2020-07-12 ENCOUNTER — Other Ambulatory Visit: Payer: Self-pay

## 2020-07-12 ENCOUNTER — Ambulatory Visit: Payer: Medicare Other | Attending: Family | Admitting: Family

## 2020-07-12 ENCOUNTER — Encounter: Payer: Self-pay | Admitting: Family

## 2020-07-12 VITALS — BP 90/56 | HR 70 | Temp 97.5°F | Resp 16 | Ht 63.0 in | Wt 105.8 lb

## 2020-07-12 DIAGNOSIS — E049 Nontoxic goiter, unspecified: Secondary | ICD-10-CM | POA: Diagnosis not present

## 2020-07-12 DIAGNOSIS — R946 Abnormal results of thyroid function studies: Secondary | ICD-10-CM | POA: Diagnosis not present

## 2020-07-12 DIAGNOSIS — Z1329 Encounter for screening for other suspected endocrine disorder: Secondary | ICD-10-CM

## 2020-07-12 DIAGNOSIS — Z23 Encounter for immunization: Secondary | ICD-10-CM

## 2020-07-12 DIAGNOSIS — G7111 Myotonic muscular dystrophy: Secondary | ICD-10-CM | POA: Diagnosis not present

## 2020-07-12 NOTE — Progress Notes (Signed)
Patient ID: Cynthia Underwood, female   DOB: Aug 31, 1971  MRN: [id number]  CC: Neck Concern   Subjective: Cynthia Underwood is a 49 y.o. female with history of right 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, symptomatic advanced heart block, Mobitz type 2 second degree heart block, bifascicular block, sinus node dysfunction, myotonic dystrophy type 1, cardiac pacemaker in situ, sickle cell trait, depression, and anemia who presents for neck concern.  1. NECK CONCERN: Says her neck has been swelling on and off for the past 6 months. Initially noticed by a family member. She recently noticed it herself 1 month ago. Denies soreness and tenderness. It is difficult to swallow fluids and solids. Says she tends to spurt fluids of of her nose because her throat isn't closing properly. Denies shortness of breath while lying on her back/in bed. No previous history of thyroid concerns. Fatigue: yes, and has Myotonic dystrophy type 1 which also causes fatigue dystrophy Cold intolerance: no Heat intolerance: no Weight gain: fluctuates Weight loss: fluctuates  Constipation: no Diarrhea/loose stools: no Palpitations: no, has paceaker Lower extremity edema: no Anxiety/depressed mood: no  2. MYOTONIC DYSTROPHY TYPE 1 FOLLOW-UP: 11/07/2018:  Visit with Dr. Margarita Rana. Patient with bilateral thigh pain occurs when she has been sitting for prolonged time. OTC analgesics recommended.   12/22/2019: Visit with Neurology Dr. Posey Pronto. Fall precautions discussed, not wearing AFO due to discomfort. Recommendation she return to Orthotics to discuss recommendations. Follow-up in 9 months.  07/12/2020: Reports still having bilateral thigh pain 5/10. Denies swelling, warmth, tenderness. Aggravated by walking and using Aleve to help.  Patient Active Problem List   Diagnosis Date Noted  . Sinus node dysfunction ([place]) 03/10/2020  . Weakness of both legs   . Watery eyes   . Sickle cell trait ([place])   . Presence  of permanent cardiac pacemaker   . Excess ear wax   . Depression   . Anemia   . Cardiac pacemaker in situ 03/01/2017  . Mobitz type 2 second degree heart block 02/15/2017  . Symptomatic advanced heart block 02/12/2017  . SOB (shortness of breath) 10/02/2014  . 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 Bifascicular block 10/02/2014  . Infection due to trichomonas vaginalis 09/15/2014  . Myotonic dystrophy, type 1 ([place]) 09/14/2014     Current Outpatient Medications on File Prior to Visit  Medication Sig Dispense Refill  . albuterol (VENTOLIN HFA) 108 (90 Base) MCG/ACT inhaler INHALE 2 PUFFS INTO THE LUNGS EVERY 4 HOURS AS NEEDED FOR WHEEZING OR SHORTNESS OF BREATH 18 g 1  . hydrocortisone (ANUSOL-HC) 2.5 % rectal cream Place 1 application rectally 2 (two) times daily as needed for hemorrhoids or anal itching. 30 g 1  . mirtazapine (REMERON) 15 MG tablet TAKE 1/2 TO 1 TABLET (15 MG TOTAL) BY MOUTH AT BEDTIME. (Patient not taking: Reported on 07/12/2020) 90 tablet 1   No current facility-administered medications on file prior to visit.    Allergies  Allergen Reactions  . Penicillin G Other (See Comments), Rash and Nausea And Vomiting    Sore throat  . Penicillins Nausea And Vomiting, Rash and Other (See Comments)    Fever Has patient had a PCN reaction causing immediate rash, facial/tongue/throat swelling, SOB or lightheadedness with hypotension: Yes Has patient had a PCN reaction causing severe rash involving mucus membranes or skin necrosis: Unknown Has patient had a PCN reaction that required hospitalization: No Has patient had a PCN reaction occurring within the  last 10 years: No If all of the above answers are "NO", then may proceed with Cephalosporin use.     Social History   Socioeconomic History  . Marital status: Single    Spouse name: Not on file  . Number of children: 0  . Years of education: 1.5 Collge  . Highest education  level: Not on file  Occupational History  . Occupation: Unemployed    Employer: OTHER  Tobacco Use  . Smoking status: Former Smoker    Packs/day: 0.00    Years: 28.00    Pack years: 0.00    Types: Cigarettes    Quit date: 10/04/2016    Years since quitting: 3.7  . Smokeless tobacco: Never Used  . Tobacco comment: smoking is intermittent - not ready to quit  Vaping Use  . Vaping Use: Never used  Substance and Sexual Activity  . Alcohol use: Not Currently    Alcohol/week: 0.0 standard drinks    Comment:  few times a year  . Drug use: Yes    Types: Marijuana    Comment: occ   . Sexual activity: Yes    Birth control/protection: None, Condom  Other Topics Concern  . Not on file  Social History Narrative   Patient lives at home with her aunt.   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  She moved to [facility] in August 2015.   She has been on disability since 2010.   Education: 1 1/2 years of college.   Caffeine - coke 2 cans/day   Exercise - no   Social Determinants of Health   Financial Resource Strain:   . Difficulty of Paying Living Expenses: Not on file  Food Insecurity:   . Worried About Charity fundraiser in the Last Year: Not on file  . Ran Out of Food in the Last Year: Not on file  Transportation Needs:   . Lack of Transportation (Medical): Not on file  . Lack of Transportation (Non-Medical): Not on file  Physical Activity:   . Days of Exercise per Week: Not on file  . Minutes of Exercise per Session: Not on file  Stress:   . Feeling of Stress : Not on file  Social Connections:   . Frequency of Communication with Friends and Family: Not on file  . Frequency of Social Gatherings with Friends and Family: Not on file  . Attends Religious Services: Not on file  . Active Member of Clubs or Organizations: Not on file  . Attends Archivist Meetings: Not on file  . Marital Status: Not on file  Intimate Partner Violence:   . Fear of Current  or Ex-Partner: Not on file  . Emotionally Abused: Not on file  . Physically Abused: Not on file  . Sexually Abused: Not on file    Family History  Problem Relation Age of Onset  . Cancer Father 77       Deceased  . Heart disease Father   . COPD Mother   . Diabetes Maternal Uncle   . Heart disease Maternal Uncle   . Heart disease Paternal Grandmother   . Diabetes Paternal Grandmother   . Hypertension Paternal Grandmother   . Neuromuscular disorder Maternal Aunt        Myotonic dystrophy  . Neuromuscular disorder Cousin        Myotonic dystrophy  . Neuromuscular disorder Sister        Myotonic dystrophy  . Colon cancer Neg Hx   .  Colon polyps Neg Hx   . Esophageal cancer Neg Hx   . Rectal cancer Neg Hx   . Stomach cancer Neg Hx     Past Surgical History:  Procedure Laterality Date  . BREAST BIOPSY Right   . EYE SURGERY Left 2017   "related to blockage in my nose"  . EYE SURGERY Right 05/2019  . INSERT / REPLACE / REMOVE PACEMAKER  02/15/2017  . PACEMAKER IMPLANT N/A 02/15/2017   Procedure: Pacemaker Implant;  Surgeon: Deboraha Sprang, MD;  Location: [facility];  Service: Cardiovascular;  Laterality: N/A;    ROS: Review of Systems Negative except as stated above  PHYSICAL EXAM: BP (!) 90/56 (BP Location: Left Arm, Patient Position: Sitting, Cuff Size: Normal)   Pulse 70   Temp (!) 97.5 F (36.4 C) (Temporal)   Resp 16   Ht 5\' 3"  (1.6 m)   Wt 105 lb 12.8 oz (48 kg)   LMP 12/07/2016   SpO2 99%   BMI 18.74 kg/m   Wt Readings from Last 3 Encounters:  07/12/20 105 lb 12.8 oz (48 kg)  03/11/20 110 lb (49.9 kg)  12/22/19 111 lb (50.3 kg)    Physical Exam General appearance - alert, well appearing, and in no distress and oriented to person, place, and time Mental status - alert, oriented to person, place, and time, normal mood, behavior, speech, dress, motor activity, and thought processes Mouth - mucous membranes moist, pharynx normal without  lesions Neck - thyroid exam: thyroid enlarged, smooth, nontender, no nodules,  Lymphatics - no palpable lymphadenopathy, no hepatosplenomegaly Chest - clear to auscultation, no wheezes, rales or rhonchi, symmetric air entry, no tachypnea, retractions or cyanosis Heart - normal rate, regular rhythm, normal S1, S2, no murmurs, rubs, clicks or gallops Neurological - alert, oriented, normal speech, no focal findings or movement disorder noted, cranial nerves II through XII intact, normal muscle tone, no tremors, strength 4/5 Musculoskeletal - no joint tenderness, deformity or swelling  ASSESSMENT AND PLAN: 1. Enlarged thyroid: 2. Thyroid disorder screen: - Patient presents with bilateral enlarged thyroid. Swelling comes and goes for at least the last 6 months. Non-tender and no shortness of breath. No previous history of thyroid concerns. - TSH panel to check thyroid function.  - Ultrasound thyroid ordered STAT and registered nurse to schedule appointment. - Referral to General Surgery for further evaluation and management. - Follow-up with primary physician in 1 - 2 weeks or sooner if needed.  - Patient was given clear instructions to go to Emergency Department or return to medical center if symptoms don't improve, worsen, or new problems develop.The patient verbalized understanding. - Ambulatory referral to General Surgery - US THYROID; Future - TSH+T4F+T3Free  3. Myotonic dystrophy, type 1 ([place]): - Chronic bilateral thigh pain. Denies swelling, warmth, and tenderness. - Continue over-the-counter analgesics as needed.  - Keep all appointments with Neurology. - Follow-up with primary physician as needed.   4. Need for immunization against influenza: - Flu vaccine administered today in clinic. - Flu Vaccine QUAD 36+ mos IM   Patient was given the opportunity to ask questions.  Patient verbalized understanding of the plan and was able to repeat key elements of the plan. Patient was given  clear instructions to go to Emergency Department or return to medical center if symptoms don't improve, worsen, or new problems develop.The patient verbalized understanding.   Orders Placed This Encounter  Procedures  . US THYROID  . Flu Vaccine QUAD 36+ mos IM  .  TSH+T4F+T3Free  . Ambulatory referral to General Surgery     Requested Prescriptions    No prescriptions requested or ordered in this encounter    Return in about 1 week (around 07/19/2020) for Dr. Margarita Rana .  Camillia Herter, NP

## 2020-07-12 NOTE — Progress Notes (Signed)
C/o chronic pain in legs/R foot, requests to see ortho  -Wants flu vaccine today

## 2020-07-12 NOTE — Patient Instructions (Addendum)
Thyroid ultrasound  Referral to General Surgery   Lab today   Follow-up with primary physician in 1 weeks or sooner if needed  Hypothyroidism  Hypothyroidism is when the thyroid gland does not make enough of certain hormones (it is underactive). The thyroid gland is a small gland located in the lower front part of the neck, just in front of the windpipe (trachea). This gland makes hormones that help control how the body uses food for energy (metabolism) as well as how the heart and brain function. These hormones also play a role in keeping your bones strong. When the thyroid is underactive, it produces too little of the hormones thyroxine (T4) and triiodothyronine (T3). What are the causes? This condition may be caused by:  Hashimoto's disease. This is a disease in which the body's disease-fighting system (immune system) attacks the thyroid gland. This is the most common cause.  Viral infections.  Pregnancy.  Certain medicines.  Birth defects.  Past radiation treatments to the head or neck for cancer.  Past treatment with radioactive iodine.  Past exposure to radiation in the environment.  Past surgical removal of part or all of the thyroid.  Problems with a gland in the center of the brain (pituitary gland).  Lack of enough iodine in the diet. What increases the risk? You are more likely to develop this condition if:  You are female.  You have a family history of thyroid conditions.  You use a medicine called lithium.  You take medicines that affect the immune system (immunosuppressants). What are the signs or symptoms? Symptoms of this condition include:  Feeling as though you have no energy (lethargy).  Not being able to tolerate cold.  Weight gain that is not explained by a change in diet or exercise habits.  Lack of appetite.  Dry skin.  Coarse hair.  Menstrual irregularity.  Slowing of thought processes.  Constipation.  Sadness or  depression. How is this diagnosed? This condition may be diagnosed based on:  Your symptoms, your medical history, and a physical exam.  Blood tests. You may also have imaging tests, such as an ultrasound or MRI. How is this treated? This condition is treated with medicine that replaces the thyroid hormones that your body does not make. After you begin treatment, it may take several weeks for symptoms to go away. Follow these instructions at home:  Take over-the-counter and prescription medicines only as told by your health care provider.  If you start taking any new medicines, tell your health care provider.  Keep all follow-up visits as told by your health care provider. This is important. ? As your condition improves, your dosage of thyroid hormone medicine may change. ? You will need to have blood tests regularly so that your health care provider can monitor your condition. Contact a health care provider if:  Your symptoms do not get better with treatment.  You are taking thyroid replacement medicine and you: ? Sweat a lot. ? Have tremors. ? Feel anxious. ? Lose weight rapidly. ? Cannot tolerate heat. ? Have emotional swings. ? Have diarrhea. ? Feel weak. Get help right away if you have:  Chest pain.  An irregular heartbeat.  A rapid heartbeat.  Difficulty breathing. Summary  Hypothyroidism is when the thyroid gland does not make enough of certain hormones (it is underactive).  When the thyroid is underactive, it produces too little of the hormones thyroxine (T4) and triiodothyronine (T3).  The most common cause is Hashimoto's disease, a  disease in which the body's disease-fighting system (immune system) attacks the thyroid gland. The condition can also be caused by viral infections, medicine, pregnancy, or past radiation treatment to the head or neck.  Symptoms may include weight gain, dry skin, constipation, feeling as though you do not have energy, and not  being able to tolerate cold.  This condition is treated with medicine to replace the thyroid hormones that your body does not make. This information is not intended to replace advice given to you by your health care provider. Make sure you discuss any questions you have with your health care provider. Document Revised: 08/03/2017 Document Reviewed: 08/01/2017 Elsevier Patient Education  2020 [employer].

## 2020-07-13 ENCOUNTER — Telehealth: Payer: Self-pay

## 2020-07-13 LAB — TSH+T4F+T3FREE
Free T4: 0.86 ng/dL (ref 0.82–1.77)
T3, Free: 2.6 pg/mL (ref 2.0–4.4)
TSH: 0.82 u[IU]/mL (ref 0.450–4.500)

## 2020-07-13 NOTE — Telephone Encounter (Signed)
Thyroid U/S scheduled for 11/15 @ 11:30am, pt to arrive @ 11:15am, LMOM informing pt of appt date/time and gave phone # for radiology scheduling in case of need to reschedule.

## 2020-07-13 NOTE — Progress Notes (Signed)
Please call patient with update.   Thyroid normal.   Reminder to go to [facility] for thyroid ultrasound (please schedule this for patient urgently).  Follow-up with primary physician and General Surgery as discussed during visit.

## 2020-07-13 NOTE — Telephone Encounter (Signed)
-----   Message from Eddie Dibbles, RN sent at 07/12/2020  5:47 PM EST ----- Regarding: Schedule STAT U/S Schedule U/S

## 2020-07-19 ENCOUNTER — Other Ambulatory Visit: Payer: Self-pay

## 2020-07-19 ENCOUNTER — Ambulatory Visit: Payer: Medicare Other | Attending: Family Medicine | Admitting: Family Medicine

## 2020-07-19 ENCOUNTER — Encounter: Payer: Self-pay | Admitting: Family Medicine

## 2020-07-19 ENCOUNTER — Ambulatory Visit (HOSPITAL_COMMUNITY)
Admission: RE | Admit: 2020-07-19 | Discharge: 2020-07-19 | Disposition: A | Discharge status: Home / Self Care | Payer: Medicare Other | Source: Ambulatory Visit | Attending: Family | Admitting: Family

## 2020-07-19 DIAGNOSIS — E049 Nontoxic goiter, unspecified: Secondary | ICD-10-CM | POA: Diagnosis not present

## 2020-07-19 DIAGNOSIS — G7111 Myotonic muscular dystrophy: Secondary | ICD-10-CM | POA: Diagnosis not present

## 2020-07-19 DIAGNOSIS — I441 Atrioventricular block, second degree: Secondary | ICD-10-CM | POA: Diagnosis not present

## 2020-07-19 DIAGNOSIS — E041 Nontoxic single thyroid nodule: Secondary | ICD-10-CM | POA: Diagnosis not present

## 2020-07-19 MED ORDER — TIZANIDINE HCL 4 MG PO TABS: 4.0000 mg | ORAL_TABLET | Freq: Three times a day (TID) | ORAL | 1 refills | Status: DC | PRN

## 2020-07-19 NOTE — Progress Notes (Signed)
Had US done today.   Follow up for enlarged thyroid.  Wants to discuss general surgery.

## 2020-07-19 NOTE — Progress Notes (Signed)
Virtual Visit via Telephone Note  I connected with Cynthia Underwood, on 07/19/2020 at 1:45 PM by telephone due to the COVID-19 pandemic and verified that I am speaking with the correct person using two identifiers.   Consent: I discussed the limitations, risks, security and privacy concerns of performing an evaluation and management service by telephone and the availability of in person appointments. I also discussed with the patient that there may be a patient responsible charge related to this service. The patient expressed understanding and agreed to proceed.   Location of Patient: Home  Location of Provider: Clinic   Persons participating in Telemedicine visit: Rebecca Eaton Farrington-CMA Dr. Margarita Rana     History of Present Illness: Cynthia Underwood  is a 49 year old female with a history of myotonic dystrophy complicated by cardiac conduction abnormalities including second-degree heart s/p pacemaker placement who presents today for a follow-up visit. Complains of worsening leg pains and does not have any medications for this.  States legs pains have been chronic however this has increased in intensity over time.  Follow-up with her neurologist Dr. Posey Pronto comes up on 09/24/2020.  She had a neck ultrasound today due to complaints of enlarged thyroid and results are pending.  She had also been referred to general surgery for evaluation of possible thyromegaly and has an upcoming appointment on 08/12/2020.   Visit with her EP cardiologist was in 03/2020.  She has no chest pains or palpitations.    Past Medical History:  Diagnosis Date  . Anemia   . Bifascicular block 10/02/2014  . Depression   . Excess ear wax   . Myotonic dystrophy, type 1 ([place]) dx'd ~ 2005   Genetically confirmed  . Presence of permanent cardiac pacemaker   . 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 Sickle cell trait ([place])   . Watery eyes    Left  .  Weakness of both legs    Allergies  Allergen Reactions  . Penicillin G Other (See Comments), Rash and Nausea And Vomiting    Sore throat  . Penicillins Nausea And Vomiting, Rash and Other (See Comments)    Fever Has patient had a PCN reaction causing immediate rash, facial/tongue/throat swelling, SOB or lightheadedness with hypotension: Yes Has patient had a PCN reaction causing severe rash involving mucus membranes or skin necrosis: Unknown Has patient had a PCN reaction that required hospitalization: No Has patient had a PCN reaction occurring within the last 10 years: No If all of the above answers are "NO", then may proceed with Cephalosporin use.     Current Outpatient Medications on File Prior to Visit  Medication Sig Dispense Refill  . albuterol (VENTOLIN HFA) 108 (90 Base) MCG/ACT inhaler INHALE 2 PUFFS INTO THE LUNGS EVERY 4 HOURS AS NEEDED FOR WHEEZING OR SHORTNESS OF BREATH 18 g 1  . hydrocortisone (ANUSOL-HC) 2.5 % rectal cream Place 1 application rectally 2 (two) times daily as needed for hemorrhoids or anal itching. 30 g 1  . mirtazapine (REMERON) 15 MG tablet TAKE 1/2 TO 1 TABLET (15 MG TOTAL) BY MOUTH AT BEDTIME. (Patient not taking: Reported on 07/12/2020) 90 tablet 1   No current facility-administered medications on file prior to visit.    Observations/Objective: Awake, alert, oriented x3 Not in acute distress  Assessment and Plan: 1. Myotonic dystrophy, type 1 ([place]) With ongoing leg pains Placed on tizanidine to be used as needed Advised to keep appointment with neurology  2. Mobitz  type 2 second degree heart block Status post pacemaker Followed by electrophysiology   Follow Up Instructions: 6 months for chronic disease management   I discussed the assessment and treatment plan with the patient. The patient was provided an opportunity to ask questions and all were answered. The patient agreed with the plan and demonstrated an understanding of the  instructions.   The patient was advised to call back or seek an in-person evaluation if the symptoms worsen or if the condition fails to improve as anticipated.     I provided 13 minutes total of non-face-to-face time during this encounter including median intraservice time, reviewing previous notes, investigations, ordering medications, medical decision making, coordinating care and patient verbalized understanding at the end of the visit.     Charlott Rakes, MD, FAAFP. [facility] and [place], [place]   07/19/2020, 1:45 PM

## 2020-08-12 DIAGNOSIS — E041 Nontoxic single thyroid nodule: Secondary | ICD-10-CM | POA: Diagnosis not present

## 2020-08-16 ENCOUNTER — Ambulatory Visit (INDEPENDENT_AMBULATORY_CARE_PROVIDER_SITE_OTHER): Payer: Medicare Other

## 2020-08-16 DIAGNOSIS — R001 Bradycardia, unspecified: Secondary | ICD-10-CM | POA: Diagnosis not present

## 2020-08-16 LAB — CUP PACEART REMOTE DEVICE CHECK
Battery Remaining Longevity: 106 mo
Battery Voltage: 3 V
Brady Statistic AP VP Percent: 5.39 %
Brady Statistic AP VS Percent: 49.66 %
Brady Statistic AS VP Percent: 22.67 %
Brady Statistic AS VS Percent: 22.28 %
Brady Statistic RA Percent Paced: 54.99 %
Brady Statistic RV Percent Paced: 28.08 %
Date Time Interrogation Session: 20211213010810
Implantable Lead Implant Date: 20180614
Implantable Lead Implant Date: 20180614
Implantable Lead Location: 753859
Implantable Lead Location: 753859
Implantable Lead Model: 3830
Implantable Lead Model: 5076
Implantable Pulse Generator Implant Date: 20180614
Lead Channel Impedance Value: 323 Ohm
Lead Channel Impedance Value: 380 Ohm
Lead Channel Impedance Value: 513 Ohm
Lead Channel Impedance Value: 627 Ohm
Lead Channel Pacing Threshold Amplitude: 0.75 V
Lead Channel Pacing Threshold Amplitude: 1 V
Lead Channel Pacing Threshold Pulse Width: 0.4 ms
Lead Channel Pacing Threshold Pulse Width: 0.4 ms
Lead Channel Sensing Intrinsic Amplitude: 4.125 mV
Lead Channel Sensing Intrinsic Amplitude: 4.125 mV
Lead Channel Sensing Intrinsic Amplitude: 4.375 mV
Lead Channel Sensing Intrinsic Amplitude: 4.375 mV
Lead Channel Setting Pacing Amplitude: 2 V
Lead Channel Setting Pacing Amplitude: 2 V
Lead Channel Setting Pacing Pulse Width: 1 ms
Lead Channel Setting Sensing Sensitivity: 2 mV

## 2020-08-30 ENCOUNTER — Other Ambulatory Visit (HOSPITAL_COMMUNITY): Payer: Self-pay | Admitting: Physician Assistant

## 2020-08-30 ENCOUNTER — Ambulatory Visit (HOSPITAL_COMMUNITY)
Admission: RE | Admit: 2020-08-30 | Discharge: 2020-08-30 | Disposition: A | Discharge status: Home / Self Care | Payer: Medicare Other | Source: Ambulatory Visit | Attending: Pulmonary Disease | Admitting: Pulmonary Disease

## 2020-08-30 DIAGNOSIS — Z23 Encounter for immunization: Secondary | ICD-10-CM | POA: Insufficient documentation

## 2020-08-30 DIAGNOSIS — U071 COVID-19: Secondary | ICD-10-CM | POA: Diagnosis not present

## 2020-08-30 DIAGNOSIS — D571 Sickle-cell disease without crisis: Secondary | ICD-10-CM | POA: Insufficient documentation

## 2020-08-30 MED ORDER — DIPHENHYDRAMINE HCL 50 MG/ML IJ SOLN
50.0000 mg | Freq: Once | INTRAMUSCULAR | Status: DC | PRN
Start: 2020-08-30 — End: 2020-08-31

## 2020-08-30 MED ORDER — SODIUM CHLORIDE 0.9 % IV SOLN
INTRAVENOUS | Status: DC | PRN
Start: 2020-08-30 — End: 2020-08-31

## 2020-08-30 MED ORDER — ALBUTEROL SULFATE HFA 108 (90 BASE) MCG/ACT IN AERS: 2.0000 | INHALATION_SPRAY | Freq: Once | RESPIRATORY_TRACT | Status: DC | PRN

## 2020-08-30 MED ORDER — SODIUM CHLORIDE 0.9 % IV SOLN: Freq: Once | INTRAVENOUS | Status: AC

## 2020-08-30 MED ORDER — METHYLPREDNISOLONE SODIUM SUCC 125 MG IJ SOLR: 125.0000 mg | Freq: Once | INTRAMUSCULAR | Status: DC | PRN

## 2020-08-30 MED ORDER — FAMOTIDINE IN NACL 20-0.9 MG/50ML-% IV SOLN: 20.0000 mg | Freq: Once | INTRAVENOUS | Status: DC | PRN

## 2020-08-30 MED ORDER — EPINEPHRINE 0.3 MG/0.3ML IJ SOAJ: 0.3000 mg | Freq: Once | INTRAMUSCULAR | Status: DC | PRN

## 2020-08-30 NOTE — Discharge Instructions (Signed)
10 Things You Can Do to Manage Your COVID-19 Symptoms at Home °If you have possible or confirmed COVID-19: °1. Stay home from work and school. And stay away from other public places. If you must go out, avoid using any kind of public transportation, ridesharing, or taxis. °2. Monitor your symptoms carefully. If your symptoms get worse, call your healthcare provider immediately. °3. Get rest and stay hydrated. °4. If you have a medical appointment, call the healthcare provider ahead of time and tell them that you have or may have COVID-19. °5. For medical emergencies, call 911 and notify the dispatch personnel that you have or may have COVID-19. °6. Cover your cough and sneezes with a tissue or use the inside of your elbow. °7. Wash your hands often with soap and water for at least 20 seconds or clean your hands with an alcohol-based hand sanitizer that contains at least 60% alcohol. °8. As much as possible, stay in a specific room and away from other people in your home. Also, you should use a separate bathroom, if available. If you need to be around other people in or outside of the home, wear a mask. °9. Avoid sharing personal items with other people in your household, like dishes, towels, and bedding. °10. Clean all surfaces that are touched often, like counters, tabletops, and doorknobs. Use household cleaning sprays or wipes according to the label instructions. °What types of side effects do monoclonal antibody drugs cause?  °Common side effects ° °In general, the more common side effects caused by monoclonal antibody drugs include: °• Allergic reactions, such as hives or itching °• Flu-like signs and symptoms, including chills, fatigue, fever, and muscle aches and pains °• Nausea, vomiting °• Diarrhea °• Skin rashes °• Low blood pressure ° ° °The CDC is recommending patients who receive monoclonal antibody treatments wait at least 90 days before being vaccinated. ° °Currently, there are no data on the safety  and efficacy of mRNA COVID-19 vaccines in persons who received monoclonal antibodies or convalescent plasma as part of COVID-19 treatment. Based on the estimated half-life of such therapies as well as evidence suggesting that reinfection is uncommon in the 90 days after initial infection, vaccination should be deferred for at least 90 days, as a precautionary measure until additional information becomes available, to avoid interference of the antibody treatment with vaccine-induced immune responses. °cdc.gov/coronavirus °03/05/2019 °This information is not intended to replace advice given to you by your health care provider. Make sure you discuss any questions you have with your health care provider. °Document Revised: 08/07/2019 Document Reviewed: 08/07/2019 °Elsevier Patient Education © 2020 Else[employer] Inc. ° °

## 2020-08-30 NOTE — Progress Notes (Signed)
Patient reviewed Fact Sheet for Patients, Parents, and Caregivers for Emergency Use Authorization (EUA) of casi for the Treatment of Coronavirus. Patient also reviewed and is agreeable to the estimated cost of treatment. Patient is agreeable to proceed.

## 2020-08-30 NOTE — Progress Notes (Unsigned)
I connected by phone with Cynthia Underwood on 08/30/2020 at 1:18 PM to discuss the potential use of a new treatment for mild to moderate COVID-19 viral infection in non-hospitalized patients.  This patient is a 49 y.o. female that meets the FDA criteria for Emergency Use Authorization of COVID monoclonal antibody casirivimab/imdevimab, bamlanivimab/etesevimab, or sotrovimab.  Has a (+) direct SARS-CoV-2 viral test result  Has mild or moderate COVID-19   Is NOT hospitalized due to COVID-19  Is within 10 days of symptom onset  Has at least one of the high risk factor(s) for progression to severe COVID-19 and/or hospitalization as defined in EUA.  Specific high risk criteria : Sickle Cell Disease unvaccinated   I have spoken and communicated the following to the patient or parent/caregiver regarding COVID monoclonal antibody treatment:  1. FDA has authorized the emergency use for the treatment of mild to moderate COVID-19 in adults and pediatric patients with positive results of direct SARS-CoV-2 viral testing who are 52 years of age and older weighing at least 40 kg, and who are at high risk for progressing to severe COVID-19 and/or hospitalization.  2. The significant known and potential risks and benefits of COVID monoclonal antibody, and the extent to which such potential risks and benefits are unknown.  3. Information on available alternative treatments and the risks and benefits of those alternatives, including clinical trials.  4. Patients treated with COVID monoclonal antibody should continue to self-isolate and use infection control measures (e.g., wear mask, isolate, social distance, avoid sharing personal items, clean and disinfect "high touch" surfaces, and frequent handwashing) according to CDC guidelines.   5. The patient or parent/caregiver has the option to accept or refuse COVID monoclonal antibody treatment.  After reviewing this information with the patient, the patient  has agreed to receive one of the available covid 19 monoclonal antibodies and will be provided an appropriate fact sheet prior to infusion. Konrad Felix, PA-C 08/30/2020 1:18 PM

## 2020-08-30 NOTE — Progress Notes (Signed)
  Diagnosis: COVID-19  Physician:Dr Joya Gaskins  Procedure: [facility] Med: casirivimab\imdevimab infusion - Provided patient with casirivimab\imdevimab fact sheet for patients, parents and caregivers prior to infusion.  Complications: No immediate complications noted.  Discharge: Discharged home   [place], [place] 08/30/2020

## 2020-08-31 NOTE — Progress Notes (Signed)
Remote pacemaker transmission.   

## 2020-09-10 ENCOUNTER — Ambulatory Visit: Payer: Medicare Other | Admitting: Otolaryngology

## 2020-09-24 ENCOUNTER — Telehealth (INDEPENDENT_AMBULATORY_CARE_PROVIDER_SITE_OTHER): Payer: Medicare Other | Admitting: Neurology

## 2020-09-24 ENCOUNTER — Other Ambulatory Visit: Payer: Self-pay

## 2020-09-24 DIAGNOSIS — G7111 Myotonic muscular dystrophy: Secondary | ICD-10-CM

## 2020-09-24 DIAGNOSIS — M791 Myalgia, unspecified site: Secondary | ICD-10-CM | POA: Diagnosis not present

## 2020-09-24 MED ORDER — GABAPENTIN 250 MG/5ML PO SOLN: ORAL | 5 refills | Status: DC

## 2020-09-24 NOTE — Progress Notes (Signed)
° °  Virtual Visit via Virtual Note The purpose of this virtual visit is to provide medical care while limiting exposure to the novel coronavirus.  Patient was unable to connect via video so visit transitioned to telephone.   Consent was obtained for virtual visit:  Yes.   Answered questions that patient had about telehealth interaction:  Yes.   I discussed the limitations, risks, security and privacy concerns of performing an evaluation and management service by telemedicine. I also discussed with the patient that there may be a patient responsible charge related to this service. The patient expressed understanding and agreed to proceed.  Pt location: Home Physician Location: office Name of referring provider:  Charlott Rakes, MD I connected with Cynthia Underwood at patients initiation/request on 09/24/2020 at  9:30 AM EST by video enabled telemedicine application and verified that I am speaking with the correct person using two identifiers. Pt MRN:  [id number] Pt DOB:  [birth year] Participants:  Cynthia Underwood   History of Present Illness: This is a 50 y.o. female returning for follow-up of myotonic dystrophy.  Overall, she has been stable with no new complaints.  Her biggest symptoms is ongoing soreness and pain in the legs.  She was previously on Cymbalta which helped, but she stopped taking it a few years back after her mood improved.  She is unable to swallow pills, so prefers liquid solution or something she can crush. She has mild progression of weakness,but remains highly independent.  She did get new AFOs which are more comfortable, however, unable to wera them regularly due to difficulty finding shoes that fit them.  No interval falls.   She had COVID in December and weight went down. She received out-patient monoclonal antibodies and has recovered well.  She is not vaccinated.    Assessment and Plan:  1. 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   Disease complicated by cataracts, second-degree AV block s/p PPM, dyspnea, and dysphagia.  She has bilateral foot drop and distal hand weakness, with mild progression.  Her most bothersome symptom is ongoing myalgias I will start her on gabapentin liquid solution (due to dysphagia) - start 100mg  at bedtime x 1 week, then increase to 200mg  at bedtime.  She was instructed to call in 3 weeks to determine further titration She has new AFOs, which are more comfortable, but she has not been able to find shoes that fit well.  I suggested that she try buying half shoe size up to see if this helps.  2. COVID19 vaccination counseling.   I urged patient to get vaccinated when able.   Follow Up Instructions:   I discussed the assessment and treatment plan with the patient. The patient was provided an opportunity to ask questions and all were answered. The patient agreed with the plan and demonstrated an understanding of the instructions.   The patient was advised to call back or seek an in-person evaluation if the symptoms worsen or if the condition fails to improve as anticipated.  Follow-up in 9 months  Time spent:  22 min  [place], DO

## 2020-10-01 ENCOUNTER — Ambulatory Visit: Payer: Medicare Other | Admitting: Otolaryngology

## 2020-10-15 ENCOUNTER — Ambulatory Visit: Payer: Medicare Other | Attending: Otolaryngology | Admitting: Otolaryngology

## 2020-10-15 ENCOUNTER — Encounter: Payer: Self-pay | Admitting: Otolaryngology

## 2020-10-15 ENCOUNTER — Other Ambulatory Visit: Payer: Self-pay

## 2020-10-15 VITALS — Wt 104.0 lb

## 2020-10-15 DIAGNOSIS — E040 Nontoxic diffuse goiter: Secondary | ICD-10-CM

## 2020-10-15 DIAGNOSIS — E049 Nontoxic goiter, unspecified: Secondary | ICD-10-CM

## 2020-10-15 NOTE — Patient Instructions (Signed)
you report issues with swallowing, including both food and liquids coming up from your nose.  This seems to be intermittent, but gradually more frequent.  You feel like your neck is more swollen when you are upset.  You had a thyroid ultrasound in November 21 which showed a left superior pole nodule with recommended biopsy.  This was not performed yet.  She was sent to Dr. Rosendo Gros, general surgeon, for consideration.  He apparently recommended a thyroidectomy.  I would like to check a swallowing x-ray and also a CT scan of your neck.  We will call you with these results.  It is possible you will need surgery.  Dr. Rosendo Gros is fine.  It is also possible that at least some of the swallowing issues are related to your myotonic dystrophy.  We may need to do an additional swallowing test including a speech pathologist.

## 2020-10-15 NOTE — Progress Notes (Unsigned)
F/u for throat

## 2020-10-15 NOTE — Progress Notes (Unsigned)
Chief complaint: Dysphagia with velopharyngeal insufficiency  History: 50 year old black female with a history of lower neck swelling for a number of months.  She thinks it may be coming and going.  Thyroid ultrasound in November showed a left-sided nodule recommended for biopsy.  This has not yet been performed.  She was sent to Dr. Rosendo Gros for consideration for thyroid surgery.  She has some nasal regurgitation with liquids greater than solids.  No pain.  No change in voice.  No breathing difficulty.  TSH is normal.  Examination: She is thin.  Voice is slightly muffled.  Mental status is sharp.  She hears adequately in conversational speech.  Voice is clear and respirations unlabored through the nose.  The head is atraumatic and neck supple.  Cranial nerves intact.  Ear canals are waxy on both sides.  Anterior nose is moist and patent.  Oral cavity is clear with teeth in good repair.  She wears an upper plate.  Oropharynx is clear with normal-appearing soft palate.  Neck with a rubbery bulky thyroid gland, multinodular.  No discrete adenopathy.  Impression: Multinodular goiter.  Velopharyngeal insufficiency possibly related to the bulky thyroid gland, possibly related to her myotonic dystrophy.  Plan: I would like to check a CT scan of the neck with contrast to rule out retrosternal extension or recent growth.  I would like to check a barium swallow to see if she is having true insufficiency.  We may need to do a modified barium swallow with the speech pathologist to better assess this issue.

## 2020-10-21 ENCOUNTER — Ambulatory Visit (HOSPITAL_COMMUNITY)
Admission: RE | Admit: 2020-10-21 | Discharge: 2020-10-21 | Disposition: A | Discharge status: Home / Self Care | Payer: Medicare Other | Source: Ambulatory Visit | Attending: Otolaryngology | Admitting: Otolaryngology

## 2020-10-21 ENCOUNTER — Other Ambulatory Visit: Payer: Self-pay

## 2020-10-21 DIAGNOSIS — E040 Nontoxic diffuse goiter: Secondary | ICD-10-CM

## 2020-10-21 DIAGNOSIS — K222 Esophageal obstruction: Secondary | ICD-10-CM | POA: Diagnosis not present

## 2020-10-21 DIAGNOSIS — E049 Nontoxic goiter, unspecified: Secondary | ICD-10-CM

## 2020-10-21 DIAGNOSIS — E041 Nontoxic single thyroid nodule: Secondary | ICD-10-CM | POA: Diagnosis not present

## 2020-10-21 DIAGNOSIS — R1310 Dysphagia, unspecified: Secondary | ICD-10-CM | POA: Diagnosis not present

## 2020-10-21 MED ORDER — IOHEXOL 300 MG/ML  SOLN
75.0000 mL | Freq: Once | INTRAMUSCULAR | Status: AC | PRN
  Administered 2020-10-21: 75 mL via INTRAVENOUS

## 2020-10-26 ENCOUNTER — Ambulatory Visit (HOSPITAL_COMMUNITY): Payer: Medicare Other

## 2020-11-04 DIAGNOSIS — H26493 Other secondary cataract, bilateral: Secondary | ICD-10-CM | POA: Diagnosis not present

## 2020-11-15 ENCOUNTER — Ambulatory Visit (INDEPENDENT_AMBULATORY_CARE_PROVIDER_SITE_OTHER): Payer: Medicare Other

## 2020-11-15 DIAGNOSIS — I441 Atrioventricular block, second degree: Secondary | ICD-10-CM

## 2020-11-17 LAB — CUP PACEART REMOTE DEVICE CHECK
Battery Remaining Longevity: 100 mo
Battery Voltage: 3 V
Brady Statistic AP VP Percent: 8.68 %
Brady Statistic AP VS Percent: 37.57 %
Brady Statistic AS VP Percent: 41.47 %
Brady Statistic AS VS Percent: 12.28 %
Brady Statistic RA Percent Paced: 46.1 %
Brady Statistic RV Percent Paced: 50.15 %
Date Time Interrogation Session: 20220314021112
Implantable Lead Implant Date: 20180614
Implantable Lead Implant Date: 20180614
Implantable Lead Location: 753859
Implantable Lead Location: 753859
Implantable Lead Model: 3830
Implantable Lead Model: 5076
Implantable Pulse Generator Implant Date: 20180614
Lead Channel Impedance Value: 285 Ohm
Lead Channel Impedance Value: 361 Ohm
Lead Channel Impedance Value: 456 Ohm
Lead Channel Impedance Value: 570 Ohm
Lead Channel Pacing Threshold Amplitude: 0.75 V
Lead Channel Pacing Threshold Amplitude: 1 V
Lead Channel Pacing Threshold Pulse Width: 0.4 ms
Lead Channel Pacing Threshold Pulse Width: 0.4 ms
Lead Channel Sensing Intrinsic Amplitude: 3.875 mV
Lead Channel Sensing Intrinsic Amplitude: 3.875 mV
Lead Channel Sensing Intrinsic Amplitude: 4.625 mV
Lead Channel Sensing Intrinsic Amplitude: 4.625 mV
Lead Channel Setting Pacing Amplitude: 2 V
Lead Channel Setting Pacing Amplitude: 2 V
Lead Channel Setting Pacing Pulse Width: 1 ms
Lead Channel Setting Sensing Sensitivity: 2 mV

## 2020-11-24 NOTE — Progress Notes (Signed)
Remote pacemaker transmission.   

## 2020-11-29 DIAGNOSIS — H26491 Other secondary cataract, right eye: Secondary | ICD-10-CM | POA: Diagnosis not present

## 2020-12-10 ENCOUNTER — Telehealth: Payer: Self-pay | Admitting: *Deleted

## 2020-12-10 ENCOUNTER — Ambulatory Visit: Payer: Self-pay | Admitting: General Surgery

## 2020-12-10 DIAGNOSIS — E041 Nontoxic single thyroid nodule: Secondary | ICD-10-CM | POA: Diagnosis not present

## 2020-12-10 NOTE — Telephone Encounter (Signed)
Called the patient on the number listed for her home and mobile numbers and could not leave a voice message due to voicemail box is not set up. I will try calling patient again before end of business day.

## 2020-12-10 NOTE — H&P (Signed)
  History of Present Illness Cynthia Ok MD; 12/10/2020 9:39 AM) The patient is a 50 year old female who presents with a thyroid nodule.Patient follow back up today from or enlarged thyroid gland. Patient has had evaluation by Dr. Erik Obey ENT. Patient underwent CT scan as well as swallow study. Patient states that he was not concerned with her voice as this is been her normal voice recently with the thyromegaly. CT scan does not show any extension substernally. I did review this personally. Patient continues with dysphagia as well as discomfort secondary to her thyromegaly.  Patient sees Dr. Caryl Comes her electrophysiologist for her pacemaker Patient sees Dr. Radford Pax cardiologist.  -------------------------------------------------- Chief Complaint: Thyroid nodule.  Patient is a 50 year old female, who comes in secondary to enlarged thyroid gland. Patient's had discovered this recently. She states that she's had some dysphagia for a long period of time. She states that she has troubles with both liquids and solids however solids or worse. She states that any liquids, but her nose. Patient states that she also has noticed a voice change. She feels that her voice is a lot deeper that have been in the past. Patient with ultrasound which I reviewed personally. This did reveal a 2.6 x 2 cm left thyroid nodule. Patient's thyroid function tests within normal limits. I did review these as well.  Patient denies any family history of any thyroid cancers.  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 She did not have any treatment for this.     Allergies Lindwood Coke, RN; 12/10/2020 9:08 AM) Penicillin G Potassium *PENICILLINS*  Rash. Allergies Reconciled   Medication History (Diane Herrin, RN; 12/10/2020 9:08 AM) Albuterol Sulfate HFA (108 (90 Base)MCG/ACT Aerosol Soln, Inhalation) Active. Hydrocortisone  (Perianal) (2.5% Cream, External) Active. tiZANidine HCl (4MG  Tablet, Oral) Active. Medications Reconciled    Review of Systems Cynthia Ok, MD; 12/10/2020 9:40 AM) All other systems negative   Physical Exam Cynthia Ok, MD; 12/10/2020 9:40 AM) The physical exam findings are as follows: Note: Constitutional: No acute distress, conversant, appears stated age, harsh voice  Eyes: Anicteric sclerae, moist conjunctiva, no lid lag  Neck: thyromegaly-firm, trachea midline, no cervical lymphadenopathy  Lungs: Clear to auscultation biilaterally, normal respiratory effot  Cardiovascular: regular rate & rhythm, no murmurs, no peripheal edema, pedal pulses 2+  GI: Soft, no masses or hepatosplenomegaly, non-tender to palpation  MSK: Normal gait, no clubbing cyanosis, edema  Skin: No rashes, palpation reveals normal skin turgor  Psychiatric: Appropriate judgment and insight, oriented to person, place, and time    Assessment & Plan Cynthia Ok MD; 12/10/2020 9:40 AM) THYROID NODULE (E04.1) Impression: 50 year old female with thyromegaly, compressive symptoms 1. Will proceed to the operating for total thyroidectomy. 2. I discussed with her the risks benefits of the procedure to include but aren't limited to: Infection, bleeding, damage to structures, possible need for tracheostomy, possibly further surgery. Also discussed with patient that she will require lifelong thyroid replacement therapy. Patient voiced understanding and wished to proceed.

## 2020-12-10 NOTE — Telephone Encounter (Signed)
   Patient Name: GEONNA LOCKYER  DOB: 1971/07/29  MRN: [id number]   Primary Cardiologist: Fransico Him, MD  Chart reviewed as part of pre-operative protocol coverage. Because of Mayrani Khamis [facility]'s past medical history and time since last visit, she will require a follow-up visit in order to better assess preoperative cardiovascular risk.  Pre-op covering staff: - Please schedule appointment and call patient to inform them. If patient already had an upcoming appointment within acceptable timeframe, please add "pre-op clearance" to the appointment notes so provider is aware. - Please contact requesting surgeon's office via preferred method (i.e, phone, fax) to inform them of need for appointment prior to surgery.  If applicable, this message will also be routed to pharmacy pool and/or primary cardiologist for input on holding anticoagulant/antiplatelet agent as requested below so that this information is available to the clearing provider at time of patient's appointment.   [place], [place]  12/10/2020, 1:38 PM

## 2020-12-10 NOTE — Telephone Encounter (Signed)
   [facility] Pre-operative Risk Assessment    Patient Name: Cynthia Underwood  DOB: 1971-01-19  MRN: [id number]   HEARTCARE STAFF: - Please ensure there is not already an duplicate clearance open for this procedure. - Under Visit Info/Reason for Call, type in Other and utilize the format Clearance MM/DD/YY or Clearance TBD. Do not use dashes or single digits. - If request is for dental extraction, please clarify the # of teeth to be extracted.  Request for surgical clearance:  1. What type of surgery is being performed? TOTAL THYROIDECTOMY   2. When is this surgery scheduled? TBD   3. What type of clearance is required (medical clearance vs. Pharmacy clearance to hold med vs. Both)? MEDICAL  4. Are there any medications that need to be held prior to surgery and how long? NONE LISTED   5. Practice name and name of physician performing surgery? [facility]; DR. Anne Hahn RAMIREZ   6. What is the office phone number? [phone]   7.   What is the office fax number? [place]: Lindwood Coke, RN  8.   Anesthesia type (None, local, MAC, general) ? GENERAL   Julaine Hua 12/10/2020, 10:11 AM  _________________________________________________________________   (provider comments below)

## 2020-12-10 NOTE — Telephone Encounter (Signed)
Called the requesting office and spoke with a triage nurse informing her that the patient will need an appointment with our office to be cleared and that we will make ever opportunity to contact patient to get scheduled for appointment.

## 2020-12-15 NOTE — Telephone Encounter (Signed)
Tried to reach pt to schedule a pre op appt. VM is not set up could not leave message to call the office for pre op appt.

## 2020-12-16 NOTE — Telephone Encounter (Signed)
VM not set up. Could not leave message. Will send FYI to requesting office that we have been unsuccessful in reaching the pt to schedule a pre op appt.

## 2020-12-20 NOTE — Telephone Encounter (Signed)
Pt has been scheduled to see Dr. Radford Pax 01/10/21 for pre op. Will send notes to MD and he nurse Carly. Will send FYI to requesting office pt has appt 01/10/21.

## 2021-01-10 ENCOUNTER — Ambulatory Visit (INDEPENDENT_AMBULATORY_CARE_PROVIDER_SITE_OTHER): Payer: Medicare Other | Admitting: Cardiology

## 2021-01-10 ENCOUNTER — Other Ambulatory Visit: Payer: Self-pay

## 2021-01-10 ENCOUNTER — Encounter: Payer: Self-pay | Admitting: Cardiology

## 2021-01-10 VITALS — BP 90/54 | HR 76 | Ht 63.0 in | Wt 97.6 lb

## 2021-01-10 DIAGNOSIS — R0602 Shortness of breath: Secondary | ICD-10-CM | POA: Diagnosis not present

## 2021-01-10 DIAGNOSIS — I441 Atrioventricular block, second degree: Secondary | ICD-10-CM | POA: Diagnosis not present

## 2021-01-10 DIAGNOSIS — Z01818 Encounter for other preprocedural examination: Secondary | ICD-10-CM

## 2021-01-10 DIAGNOSIS — G7111 Myotonic muscular dystrophy: Secondary | ICD-10-CM

## 2021-01-10 DIAGNOSIS — Z01810 Encounter for preprocedural cardiovascular examination: Secondary | ICD-10-CM

## 2021-01-10 NOTE — Addendum Note (Signed)
Addended by: Antonieta Iba on: 01/10/2021 02:37 PM   Modules accepted: Orders

## 2021-01-10 NOTE — Patient Instructions (Signed)
Medication Instructions:  Your physician recommends that you continue on your current medications as directed. Please refer to the Current Medication list given to you today.  *If you need a refill on your cardiac medications before your next appointment, please call your pharmacy*  Testing/Procedures: Your physician has requested that you have an echocardiogram. Echocardiography is a painless test that uses sound waves to create images of your heart. It provides your doctor with information about the size and shape of your heart and how well your heart's chambers and valves are working. This procedure takes approximately one hour. There are no restrictions for this procedure.  Follow-Up: At [facility], you and your health needs are our priority.  As part of our continuing mission to provide you with exceptional heart care, we have created designated Provider Care Teams.  These Care Teams include your primary Cardiologist (physician) and Advanced Practice Providers (APPs -  Physician Assistants and Nurse Practitioners) who all work together to provide you with the care you need, when you need it.  Your next appointment:   1 year(s)  The format for your next appointment:   In Person  Provider:   You may see Traci Turner, MD or one of the following Advanced Practice Providers on your designated Care Team:    Dayna Dunn, PA-C  Michele Lenze, PA-C   

## 2021-01-10 NOTE — Progress Notes (Signed)
Cardiology Office Note:    Date:  01/10/2021   ID:  Cynthia Underwood, DOB 09-Mar-1971, MRN [id number]  PCP:  Cynthia Rakes, MD  Cardiologist:  Fransico Him, MD    Referring MD: Cynthia Rakes, MD   Chief Complaint  Patient presents with  . Follow-up    Myotonic dystrophy, SOB, heart block s/p PPM and preop cardiac clearance    History of Present Illness:    [name] is a 50 y.o. female with a hx of myotonic dystrophy Type I since age 57 with intermittent jaw locking, finger stiffness and difficulty relaxing muscles.She was diagnosed by genetic testing. She has a family history of myotonic dystrophy and sarcoidosis with her Dad having a cardiac transplant. She is followed with Neurology. She has been followed with yearly echo. Cardiac MRI showed myocardial thinning associated with hypokinesis and subendocardial late gadolinium enhancement in the mid inferior and inferolateral wallsthat was felt torepresent either prior non-transmural infarct (low probability in this young female), cardiac sarcoidosis (subendocardial LGE is rare, more frequently there is midwall or subepicardial involvement). FDG-PET scan which showed no evidence of myocardial FDG uptake indicating that there was no evidence of sarcoidosis and the abnormality noted on cardiac MRI was likely due to myotonic dystrophy.    She has a history of RBBB and LAFB and tested positive for DM1 which increases risk of cardiac conduction disease and SCD.  She was referred to EP and found to have mobitz type 2 AV block and underwent MDT dual chamber HIS bundle pacer with septal capture and is followed by Dr. Caryl Comes.  She was referred to Pulmonary and felt not to have asthma or lung tissue disease by high resolution Chest Ct.  It was felt that her SOB was likely due to neuromuscular disease and was placed on BiPAP due to hypercarbia.  Since she started the BiPAP her SOB has improved.    She has had problems for a long time  with dysphagia and has a thyroid nodule with enlarged thyroid gland.  She has noticed changes in her voice as well.  She was seen by general surgery and planned for total thyroidectomy.  She is here for preop cardiac clearance.    Past Medical History:  Diagnosis Date  . Anemia   . Bifascicular block 10/02/2014  . Depression   . Excess ear wax   . 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 Presence of permanent cardiac pacemaker   . 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 Sickle cell trait ([place])   . Watery eyes    Left  . Weakness of both legs     Past Surgical History:  Procedure Laterality Date  . BREAST BIOPSY Right   . EYE SURGERY Left 2017   "related to blockage in my nose"  . EYE SURGERY Right 05/2019  . INSERT / REPLACE / REMOVE PACEMAKER  02/15/2017  . PACEMAKER IMPLANT N/A 02/15/2017   Procedure: Pacemaker Implant;  Surgeon: Deboraha Sprang, MD;  Location: [facility];  Service: Cardiovascular;  Laterality: N/A;    Current Medications: Current Meds  Medication Sig  . albuterol (VENTOLIN HFA) 108 (90 Base) MCG/ACT inhaler INHALE 2 PUFFS INTO THE LUNGS EVERY 4 HOURS AS NEEDED FOR WHEEZING OR SHORTNESS OF BREATH  . gabapentin (NEURONTIN) 250 MG/5ML solution Take 59mL at bedtime x 1 week, then increase to 67mL at bedtime.  Call the office in  3 weeks to see if the dose needs to be increased.  . hydrocortisone (ANUSOL-HC) 2.5 % rectal cream Place 1 application rectally 2 (two) times daily as needed for hemorrhoids or anal itching.  Marland Kitchen tiZANidine (ZANAFLEX) 4 MG tablet Take 1 tablet (4 mg total) by mouth every 8 (eight) hours as needed for muscle spasms.     Allergies:   Penicillin g and Penicillins   Social History   Socioeconomic History  . Marital status: Single    Spouse name: Not on file  . Number of children: 0  . Years of education: 1.5 Collge  . Highest education level: Not on file   Occupational History  . Occupation: Unemployed    Employer: OTHER  Tobacco Use  . Smoking status: Former Smoker    Packs/day: 0.00    Years: 28.00    Pack years: 0.00    Types: Cigarettes    Quit date: 10/04/2016    Years since quitting: 4.2  . Smokeless tobacco: Never Used  . Tobacco comment: smoking is intermittent - not ready to quit  Vaping Use  . Vaping Use: Never used  Substance and Sexual Activity  . Alcohol use: Not Currently    Alcohol/week: 0.0 standard drinks    Comment:  few times a year  . Drug use: Yes    Types: Marijuana    Comment: occ   . Sexual activity: Yes    Birth control/protection: None, Condom  Other Topics Concern  . Not on file  Social History Narrative   Patient lives at home with her aunt.   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  She moved to [facility] in August 2015.   She has been on disability since 2010.   Education: 1 1/2 years of college.   Caffeine - coke 2 cans/day   Exercise - no   Social Determinants of Radio broadcast assistant Strain: Not on file  Food Insecurity: Not on file  Transportation Needs: Not on file  Physical Activity: Not on file  Stress: Not on file  Social Connections: Not on file     Family History: The patient's family history includes COPD in her mother; Cancer (age of onset: 23) in her father; Diabetes in her maternal uncle and paternal grandmother; Heart disease in her father, maternal uncle, and paternal grandmother; Hypertension in her paternal grandmother; Neuromuscular disorder in her cousin, maternal aunt, and sister. There is no history of Colon cancer, Colon polyps, Esophageal cancer, Rectal cancer, or Stomach cancer.  ROS:   Please see the history of present illness.    ROS  All other systems reviewed and negative.   EKGs/Labs/Other Studies Reviewed:    The following studies were reviewed today: none  EKG:  EKG is  ordered today.  The ekg ordered today demonstrates NSR  with  A sensed V pacing  Recent Labs: 07/12/2020: TSH 0.820   Recent Lipid Panel    Component Value Date/Time   CHOL 214 (H) 01/10/2018 0847   TRIG 113 01/10/2018 0847   HDL 57 01/10/2018 0847   CHOLHDL 3.8 01/10/2018 0847   CHOLHDL 3.5 08/26/2014 1516   VLDL 20 08/26/2014 1516   LDLCALC 134 (H) 01/10/2018 0847    Physical Exam:    VS:  BP (!) 90/54   Pulse 76   Ht 5\' 3"  (1.6 m)   Wt 97 lb 9.6 oz (44.3 kg)   LMP 12/07/2016   SpO2 96%   BMI 17.29 kg/m  Wt Readings from Last 3 Encounters:  01/10/21 97 lb 9.6 oz (44.3 kg)  10/15/20 104 lb (47.2 kg)  07/12/20 105 lb 12.8 oz (48 kg)     GEN: Well nourished, well developed in no acute distress HEENT: Normal NECK: No JVD; No carotid bruits LYMPHATICS: No lymphadenopathy CARDIAC:RRR, no murmurs, rubs, gallops RESPIRATORY:  Clear to auscultation without rales, wheezing or rhonchi  ABDOMEN: Soft, non-tender, non-distended MUSCULOSKELETAL:  No edema; No deformity  SKIN: Warm and dry NEUROLOGIC:  Alert and oriented x 3 PSYCHIATRIC:  Normal affect    ASSESSMENT:    1. Myotonic dystrophy, type 1 ([place])   2. Mobitz type 2 second degree heart block   3. SOB (shortness of breath)    PLAN:    In order of problems listed above:  1.  Myotonic dystrophy  - cardiac MRI showed myocardial thinning associated with hypokinesis and subendocardial late gadolinium enhancement in the mid inferior and inferolateral walls and cardiac  -FDG-PET scan at [facility] was normal and therefore her cardiac MRI findings were more likely to represent myotonic dystrophy than sarcoidosis.  -Repeat 2D echo 05/24/2018 showed normal LV function with EF 55% and grade 1 diastolic dysfunction.  There was mild RV dysfunction felt likely related to chronic V pacing.   -her last 2Decho was in 2020 and showed normal LVF with EF 60-65% with normal RV  2.  RBBB with LAFB and high grade 2nd degree AV block -s/p MDT dual chamber (HIS bundle) PPM  -she is followed  in device clinic and is atrial sensed ventricular paced today. -EKG done today in the office was personally reviewed and showed NSR with V pacing  3.  SOB  - this was felt in the past to be due to underlying pulmonary etiology as her cardiac workup including MRI and echo showed normal LVF.   -She was referred to Pulmonary and felt not to have asthma or lung tissue disease by high resolution Chest Ct.  -t was felt that her SOB was likely due to neuromuscular disease and was placed on BiPAP due to hypercarbia.   -she did not tolerate BiPAP and is no longer using it.    4.  Preoperative Cardiac Clearance -she is asymptomatic from a cardiac standpoint. -she is able to achieve mets without symptoms and therefore no ischemic cardiac workup needed -I will repeat a 2D echo to ensure that LVF is still normal -she has a periop risk of major cardiac event of 0.4% based on the Revised Cardiac Risk Index  Medication Adjustments/Labs and Tests Ordered: Current medicines are reviewed at length with the patient today.  Concerns regarding medicines are outlined above.  Orders Placed This Encounter  Procedures  . EKG 12-Lead   No orders of the defined types were placed in this encounter.   Signed, Fransico Him, MD  01/10/2021 2:22 PM    [facility]

## 2021-01-12 ENCOUNTER — Telehealth: Payer: Self-pay | Admitting: Neurology

## 2021-01-12 NOTE — Telephone Encounter (Signed)
Patient called and said she does need an increase in her Gabapentin solution. She said it is not giving her the relief she needs.  [facility] [address]

## 2021-01-12 NOTE — Telephone Encounter (Signed)
Patient never called back with status on Gabapetin liquid, is only taken 100mg  at hs, wants to increase please advise.

## 2021-01-12 NOTE — Telephone Encounter (Signed)
OK to increase to 250mg  at bedtime (22mL).

## 2021-01-13 ENCOUNTER — Ambulatory Visit (HOSPITAL_COMMUNITY): Payer: Medicare Other | Attending: Cardiology

## 2021-01-13 ENCOUNTER — Other Ambulatory Visit: Payer: Self-pay

## 2021-01-13 DIAGNOSIS — I4510 Unspecified right bundle-branch block: Secondary | ICD-10-CM | POA: Insufficient documentation

## 2021-01-13 DIAGNOSIS — Z87891 Personal history of nicotine dependence: Secondary | ICD-10-CM | POA: Diagnosis not present

## 2021-01-13 DIAGNOSIS — R0602 Shortness of breath: Secondary | ICD-10-CM | POA: Insufficient documentation

## 2021-01-13 DIAGNOSIS — Z950 Presence of cardiac pacemaker: Secondary | ICD-10-CM | POA: Insufficient documentation

## 2021-01-13 DIAGNOSIS — Z8249 Family history of ischemic heart disease and other diseases of the circulatory system: Secondary | ICD-10-CM | POA: Insufficient documentation

## 2021-01-13 DIAGNOSIS — Z833 Family history of diabetes mellitus: Secondary | ICD-10-CM | POA: Diagnosis not present

## 2021-01-13 DIAGNOSIS — I358 Other nonrheumatic aortic valve disorders: Secondary | ICD-10-CM | POA: Diagnosis not present

## 2021-01-13 DIAGNOSIS — G7111 Myotonic muscular dystrophy: Secondary | ICD-10-CM | POA: Insufficient documentation

## 2021-01-13 LAB — ECHOCARDIOGRAM COMPLETE
AR max vel: 2.02 cm2
AV Area VTI: 2.48 cm2
AV Area mean vel: 2.33 cm2
AV Mean grad: 3 mmHg
AV Peak grad: 7.4 mmHg
Ao pk vel: 1.36 m/s
Area-P 1/2: 2.73 cm2
S' Lateral: 2.6 cm

## 2021-01-13 NOTE — Telephone Encounter (Signed)
Patient advised.

## 2021-02-01 ENCOUNTER — Telehealth: Payer: Self-pay | Admitting: Neurology

## 2021-02-01 NOTE — Telephone Encounter (Signed)
Patient called in needing Dr. Posey Pronto to fill out some paperwork. She will bring it in to be filled out.

## 2021-02-07 ENCOUNTER — Telehealth: Payer: Self-pay | Admitting: *Deleted

## 2021-02-07 NOTE — Telephone Encounter (Signed)
LVM--to pt regarding [employer] application--is complete by Dr. Posey Pronto and ready to be pick-up at the front office.

## 2021-02-14 ENCOUNTER — Ambulatory Visit (INDEPENDENT_AMBULATORY_CARE_PROVIDER_SITE_OTHER): Payer: Medicare Other

## 2021-02-14 DIAGNOSIS — I441 Atrioventricular block, second degree: Secondary | ICD-10-CM | POA: Diagnosis not present

## 2021-02-15 LAB — CUP PACEART REMOTE DEVICE CHECK
Battery Remaining Longevity: 97 mo
Battery Voltage: 2.99 V
Brady Statistic AP VP Percent: 5.62 %
Brady Statistic AP VS Percent: 45.8 %
Brady Statistic AS VP Percent: 31.63 %
Brady Statistic AS VS Percent: 16.95 %
Brady Statistic RA Percent Paced: 51.34 %
Brady Statistic RV Percent Paced: 37.25 %
Date Time Interrogation Session: 20220613021155
Implantable Lead Implant Date: 20180614
Implantable Lead Implant Date: 20180614
Implantable Lead Location: 753859
Implantable Lead Location: 753859
Implantable Lead Model: 3830
Implantable Lead Model: 5076
Implantable Pulse Generator Implant Date: 20180614
Lead Channel Impedance Value: 285 Ohm
Lead Channel Impedance Value: 361 Ohm
Lead Channel Impedance Value: 437 Ohm
Lead Channel Impedance Value: 551 Ohm
Lead Channel Pacing Threshold Amplitude: 0.75 V
Lead Channel Pacing Threshold Amplitude: 0.875 V
Lead Channel Pacing Threshold Pulse Width: 0.4 ms
Lead Channel Pacing Threshold Pulse Width: 0.4 ms
Lead Channel Sensing Intrinsic Amplitude: 3.625 mV
Lead Channel Sensing Intrinsic Amplitude: 3.625 mV
Lead Channel Sensing Intrinsic Amplitude: 5 mV
Lead Channel Sensing Intrinsic Amplitude: 5 mV
Lead Channel Setting Pacing Amplitude: 2 V
Lead Channel Setting Pacing Amplitude: 2 V
Lead Channel Setting Pacing Pulse Width: 1 ms
Lead Channel Setting Sensing Sensitivity: 2 mV

## 2021-03-08 NOTE — Progress Notes (Signed)
Remote pacemaker transmission.   

## 2021-03-21 NOTE — Progress Notes (Signed)
Surgical Instructions    Your procedure is scheduled on Friday, July 29th, 2022.  Report to [facility] Main Entrance "A" at 07:15 A.M., then check in with the Admitting office.  Call this number if you have problems the morning of surgery:  [phone]   If you have any questions prior to your surgery date call [phone]: Open Monday-Friday 8am-4pm    Remember:  Do not eat or drink after midnight the night before your surgery    Take these medicines the morning of surgery with A SIP OF WATER, if needed:  tiZANidine (ZANAFLEX)  albuterol (VENTOLIN HFA) - please, bring the inhaler with you the day of surgery   As of today, STOP taking any Aspirin (unless otherwise instructed by your surgeon) Aleve, Naproxen, Ibuprofen, Motrin, Advil, Goody's, BC's, all herbal medications, fish oil, and all vitamins.          Do not wear jewelry or makeup Do not wear lotions, powders, perfumes, or deodorant. Do not shave 48 hours prior to surgery.   Do not bring valuables to the hospital. DO Not wear nail polish, gel polish, artificial nails, or any other type of covering on natural nails including finger and toenails. If patients have artificial nails, gel coating, etc. that need to be removed by a nail salon please have this removed prior to surgery or surgery may need to be canceled/delayed if the surgeon/ anesthesia feels like the patient is unable to be adequately monitored.             [facility] is not responsible for any belongings or valuables.  Do NOT Smoke (Tobacco/Vaping) or drink Alcohol 24 hours prior to your procedure If you use a CPAP at night, you may bring all equipment for your overnight stay.   Contacts, glasses, dentures or bridgework may not be worn into surgery, please bring cases for these belongings   For patients admitted to the hospital, discharge time will be determined by your treatment team.   Patients discharged the day of surgery will not be allowed to drive  home, and someone needs to stay with them for 24 hours.  ONLY 1 SUPPORT PERSON MAY BE PRESENT WHILE YOU ARE IN SURGERY. IF YOU ARE TO BE ADMITTED ONCE YOU ARE IN YOUR ROOM YOU WILL BE ALLOWED TWO (2) VISITORS.  Minor children may have two parents present. Special consideration for safety and communication needs will be reviewed on a case by case basis.  Special instructions:    Oral Hygiene is also important to reduce your risk of infection.  Remember - BRUSH YOUR TEETH THE MORNING OF SURGERY WITH YOUR REGULAR TOOTHPASTE   [facility]- Preparing For Surgery  Before surgery, you can play an important role. Because skin is not sterile, your skin needs to be as free of germs as possible. You can reduce the number of germs on your skin by washing with CHG (chlorahexidine gluconate) Soap before surgery.  CHG is an antiseptic cleaner which kills germs and bonds with the skin to continue killing germs even after washing.     Please do not use if you have an allergy to CHG or antibacterial soaps. If your skin becomes reddened/irritated stop using the CHG.  Do not shave (including legs and underarms) for at least 48 hours prior to first CHG shower. It is OK to shave your face.  Please follow these instructions carefully.     Shower the NIGHT BEFORE SURGERY and the MORNING OF SURGERY with CHG Soap.  If you chose to wash your hair, wash your hair first as usual with your normal shampoo. After you shampoo, rinse your hair and body thoroughly to remove the shampoo.  Then [employer] and genitals (private parts) with your normal soap and rinse thoroughly to remove soap.  After that Use CHG Soap as you would any other liquid soap. You can apply CHG directly to the skin and wash gently with a scrungie or a clean washcloth.   Apply the CHG Soap to your body ONLY FROM THE NECK DOWN.  Do not use on open wounds or open sores. Avoid contact with your eyes, ears, mouth and genitals (private parts). Wash Face and  genitals (private parts)  with your normal soap.   Wash thoroughly, paying special attention to the area where your surgery will be performed.  Thoroughly rinse your body with warm water from the neck down.  DO NOT shower/wash with your normal soap after using and rinsing off the CHG Soap.  Pat yourself dry with a CLEAN TOWEL.  Wear CLEAN PAJAMAS to bed the night before surgery  Place CLEAN SHEETS on your bed the night before your surgery  DO NOT SLEEP WITH PETS.   Day of Surgery:  Take a shower with CHG soap. Wear Clean/Comfortable clothing the morning of surgery Do not apply any deodorants/lotions.   Remember to brush your teeth WITH YOUR REGULAR TOOTHPASTE.   Please read over the following fact sheets that you were given.

## 2021-03-21 NOTE — Progress Notes (Signed)
This patient is having surgery on 04/01/2021. Patient has a pacemaker. The device rep., Gae Dry was notified via e-mail and phone with information about surgery.

## 2021-03-22 ENCOUNTER — Encounter (HOSPITAL_COMMUNITY): Payer: Self-pay

## 2021-03-22 ENCOUNTER — Encounter: Payer: Self-pay | Admitting: Internal Medicine

## 2021-03-22 ENCOUNTER — Other Ambulatory Visit: Payer: Self-pay

## 2021-03-22 ENCOUNTER — Encounter (HOSPITAL_COMMUNITY)
Admission: RE | Admit: 2021-03-22 | Discharge: 2021-03-22 | Disposition: A | Discharge status: Home / Self Care | Payer: Medicare Other | Source: Ambulatory Visit | Attending: General Surgery | Admitting: General Surgery

## 2021-03-22 DIAGNOSIS — Z01812 Encounter for preprocedural laboratory examination: Secondary | ICD-10-CM | POA: Diagnosis not present

## 2021-03-22 HISTORY — DX: Nontoxic multinodular goiter: E04.2

## 2021-03-22 HISTORY — DX: Pneumonia, unspecified organism: J18.9

## 2021-03-22 LAB — BASIC METABOLIC PANEL
Anion gap: 8 (ref 5–15)
BUN: 17 mg/dL (ref 6–20)
CO2: 27 mmol/L (ref 22–32)
Calcium: 9.8 mg/dL (ref 8.9–10.3)
Chloride: 108 mmol/L (ref 98–111)
Creatinine, Ser: 0.64 mg/dL (ref 0.44–1.00)
GFR, Estimated: >60 mL/min (ref >60)
Glucose, Bld: 105 mg/dL — ABNORMAL HIGH (ref 70–99)
Potassium: 4.1 mmol/L (ref 3.5–5.1)
Sodium: 143 mmol/L (ref 135–145)

## 2021-03-22 LAB — CBC
HCT: 40.7 % (ref 36.0–46.0)
Hemoglobin: 12.4 g/dL (ref 12.0–15.0)
MCH: 29.7 pg (ref 26.0–34.0)
MCHC: 30.5 g/dL (ref 30.0–36.0)
MCV: 97.6 fL (ref 80.0–100.0)
Platelets: 313 10*3/uL (ref 150–400)
RBC: 4.17 MIL/uL (ref 3.87–5.11)
RDW: 17.8 % — ABNORMAL HIGH (ref 11.5–15.5)
WBC: 7.7 10*3/uL (ref 4.0–10.5)
nRBC: 0 % (ref 0.0–0.2)

## 2021-03-22 NOTE — Progress Notes (Signed)
PCP - Charlott Rakes MD Cardiologist - Fransico Him MD  PPM/ICD - pacemaker Device Orders - yes Rep Notified - yes  Chest x-ray -  EKG - 01/10/21 Stress Test - 10/26/16 ECHO - 01/13/21 Cardiac Cath -   Sleep Study - 11/01/2014 CPAP - no  Fasting Blood Sugar - n/a Checks Blood Sugar _____ times a day  Blood Thinner Instructions:n/a Aspirin Instructions:  ERAS Protcol -no PRE-SURGERY Ensure or G2-   COVID TEST- scheduled for 03/30/21   Anesthesia review: yes-pacemaker  Patient denies shortness of breath, fever, cough and chest pain at PAT appointment   All instructions explained to the patient, with a verbal understanding of the material. Patient agrees to go over the instructions while at home for a better understanding. Patient also instructed to self quarantine after being tested for COVID-19. The opportunity to ask questions was provided.

## 2021-03-22 NOTE — Progress Notes (Signed)
PERIOPERATIVE PRESCRIPTION FOR IMPLANTED CARDIAC DEVICE PROGRAMMING  Patient Information: Name:  AUSHA SIEH  DOB:  [birth year]  MRN:  [id number]    Planned Procedure:  Total Thyroidectomy  Surgeon:  Dr. Ralene Ok  Date of Procedure:  04/01/2021  Cautery will be used.  Position during surgery:  supine   Please send documentation back to:  [facility] (Fax # [phone])   Device Information:  Clinic EP Physician:  Virl Axe, MD   Device Type:  Pacemaker Manufacturer and Phone #:  [employer]tronic: [id number] Pacemaker Dependent?:  Unknown. Patient needs device checked by industry d/t no in-clinic check within the past year.  Date of Last Device Check:  02/21/21 (remote) Normal Device Function?:  Yes.    Electrophysiologist's Recommendations:  Have magnet available. Provide continuous ECG monitoring when magnet is used or reprogramming is to be performed.  Procedure may interfere with device function.  Magnet should be placed over device during procedure. Patient needs device checked by industry prior to procedure due to over 1 year in clinic check device.   Per Device Clinic Standing Orders, Simone Curia, RN  8:28 AM 03/22/2021

## 2021-03-22 NOTE — Progress Notes (Signed)
Spoke with Merleen Nicely at Dr. Johney Frame office and informed her that we need pre op orders for this patient who is coming for her pre op appointment today. Surgery is scheduled 7/29. Merleen Nicely said ;he is unavailable today, but she will let him know.

## 2021-03-23 ENCOUNTER — Encounter (HOSPITAL_COMMUNITY): Payer: Self-pay

## 2021-03-23 NOTE — Progress Notes (Signed)
Anesthesia Chart Review:  Case: [id number] Date/Time: 04/01/21 0900   Procedure: TOTAL THYROIDECTOMY   Anesthesia type: General   Pre-op diagnosis: THYROID GOITER   Location: [place] OR ROOM 02 / [place] [facility]   Surgeons: Ralene Ok, MD       DISCUSSION: Patient is a 50 year old female scheduled for the above procedure.  She has a multinodular goiter and velopharyngeal insufficiency possibly related to the bulky thyroid gland or possibly related to her myotonic dystrophy. She was evaluated by ENT Jodi Marble, MD on 9/62/22 and neck CT and barium swallow study ordered which were done on 10/21/20.  Results showed a markedly enlarged multinodular thyroid gland with a 2.4 cm left thyroid nodule.  The thyroid lobes extended posterior to the trachea with mild to moderate mass-effect upon the esophagus and subtle narrowing of the trachea.  She was referred to Dr. Rosendo Gros for total thyroidectomy.  He notes CT scan did not show any extension substernally.  Other history includes former smoker (quit 10/04/16), myotonic dystrophy type 1 (diagnosed ~ 9798, disease complicated by cataracts, 2nd degree AVB s/p PPM, dyspnea, dysphagia and has had mild progression of bilateral foot drop and distal hand weakness at there 09/24/20 neurology follow-up), heart block (bifascicular block, Mobitz type 2 block, s/p Medtronic Azure XT DR MRI [id number] dual chamber HIS bundle PPM with septal capture 02/15/17), Sickle cell trait, anemia, COVID-19 (s/p monoclonal antibodies 08/30/20).   Last visit with Dr. Radford Pax for follow-up and preoperative evaluation was on 01/10/2021. She notes: 1.  Myotonic dystrophy -  cardiac MRI showed myocardial thinning associated with hypokinesis and subendocardial late gadolinium enhancement in the mid inferior and inferolateral walls and cardiac  -FDG-PET scan at [facility] was normal and therefore her cardiac MRI findings were more likely to represent myotonic dystrophy than sarcoidosis.  -Repeat 2D echo 05/24/2018  showed normal LV function with EF 55% and grade 1 diastolic dysfunction.  There was mild RV dysfunction felt likely related to chronic V pacing.   -her last 2Decho was in 2020 and showed normal LVF with EF 60-65% with normal RV   2.  RBBB with LAFB and high grade 2nd degree AV block -s/p MDT dual chamber (HIS bundle) PPM -she is followed in device clinic and is atrial sensed ventricular paced today. -EKG done today in the office was personally reviewed and showed NSR with V pacing   3.  SOB - this was felt in the past to be due to underlying pulmonary etiology as her cardiac workup including MRI and echo showed normal LVF.   -She was referred to Pulmonary and felt not to have asthma or lung tissue disease by high resolution Chest Ct.  -t was felt that her SOB was likely due to neuromuscular disease and was placed on BiPAP due to hypercarbia.   -she did not tolerate BiPAP and is no longer using it.     4.  Preoperative Cardiac Clearance -she is asymptomatic from a cardiac standpoint. -she is able to achieve mets without symptoms and therefore no ischemic cardiac workup needed -I will repeat a 2D echo to ensure that LVF is still normal -she has a periop risk of major cardiac event of 0.4% based on the Revised Cardiac Risk Index  She had the eco on 01/13/21 and showed normal LV and RV function.   Perioperative PPM Rx: Device Type:  Pacemaker Manufacturer and Phone #:  Medtronic: [phone] Pacemaker Dependent?:  Unknown. Patient needs device checked by industry d/t no in-clinic check within the  past year.  Date of Last Device Check:  02/21/21 (remote)        Normal Device Function?:  Yes.     Electrophysiologist's Recommendations: Have magnet available. Provide continuous ECG monitoring when magnet is used or reprogramming is to be performed.  Procedure may interfere with device function.  Magnet should be placed over device during procedure. Patient needs device checked by industry  prior to procedure due to over 1 year in clinic check device.    Preoperative orders not available at time of preadmission testing appointment.  Preoperative COVID-19 testing scheduled for 03/30/2021.  Anesthesia team to evaluate on the day of surgery.    VS: BP 105/68   Pulse 99   Temp 36.4 C   Resp 18   Ht 5\' 3"  (1.6 m)   Wt 41.3 kg   LMP 12/07/2016   SpO2 100%   BMI 16.12 kg/m    PROVIDERS: Charlott Rakes, MD is PCP Fransico Him, MD is cardiologist Virl Axe, MD is EP.  Telehealth visit 03/11/2020.  Normal device function.  6-month follow-up recommended. Narda Amber, DO is neurologist Brand Males, MD is pulmonologist. Last visit in 2018.    LABS: Labs reviewed: Acceptable for surgery. Thyroid panel normal on 07/12/20.  (all labs ordered are listed, but only abnormal results are displayed)  Labs Reviewed  CBC - Abnormal; Notable for the following components:      Result Value   RDW 17.8 (*)    All other components within normal limits  BASIC METABOLIC PANEL - Abnormal; Notable for the following components:   Glucose, Bld 105 (*)    All other components within normal limits    Nocturnal Polysomnogram 11/01/14: IMPRESSION/ RECOMMENDATION:   1) Normal sleep pattern without medication reported. The patient spent more time in REM and developed REM sleep earlier in the night than is typical. This may have been incidental on this study night but increased time in rem can be associated with rebound if REM had been prevented by inadequate sleep or by antidepressants in the nights before the study. The patient indicated her usual sleep pattern is to be up during the night, going to sleep during the early morning hours. Discussion of sleep habits may be helpful. 2) Occasional respiratory event with sleep disturbance, within normal limits. AHI 3.0 per hour. The normal range for adults is an AHI from 0-5 events per hour. Mild snoring with oxygen desaturation to a nadir of  84% and mean saturation 95.3% on room air.   IMAGES:  CT Soft tissue neck 10/21/20: IMPRESSION: - Markedly enlarged and heterogeneous multinodular thyroid gland. Redemonstrated 2.4 cm dominant nodule within the superior aspect of the left thyroid lobe. This nodule was evaluated on the prior thyroid ultrasound 07/19/2020 and biopsy was recommended at that time. - The thyroid lobes extend posterior to the trachea and exert mild to moderate mass effect upon the esophagus. Esophageal narrowing was noted at this level on the same day esophagram. Mass effect from the enlarged thyroid gland also subtly narrows the trachea at this level. - Right maxillary and sphenoid sinusitis, as described.   Esophogram/Barium Swallow Study 10/21/20: IMPRESSION: - Marked difficulty in initiating swallowing with delayed oropharyngeal phase and large amount of residue in the piriform sinuses and vallecula. No frank aspiration seen. - Mild narrowing in the lower cervical esophagus, likely related to thyroid goiter. - The patient may benefit from speech therapy consultation.    EKG: 01/10/21: NSR with V-paced rhythm   CV: Echo  01/13/21: IMPRESSIONS   1. Left ventricular ejection fraction, by estimation, is 60 to 65%. The  left ventricle has normal function. The left ventricle has no regional  wall motion abnormalities. Left ventricular diastolic parameters were  normal. The average left ventricular  global longitudinal strain is 23.7 %.   2. Right ventricular systolic function is normal. The right ventricular  size is normal. There is normal pulmonary artery systolic pressure. The  estimated right ventricular systolic pressure is 95.1 mmHg.   3. The mitral valve is normal in structure. No evidence of mitral valve  regurgitation.   4. The aortic valve is tricuspid. Aortic valve regurgitation is not  visualized. No aortic stenosis is present.   5. The inferior vena cava is normal in size with greater  than 50%  respiratory variability, suggesting right atrial pressure of 3 mmHg.   PET Myocardial Viability 10/26/16 ([facility]): FINAL COMMENTS  Normal PET Myocardial Perfusion Study.  No evidence of Mycardial FDG uptake suggestive of active inflamation or active Cardiac Sarcoide.    Past Medical History:  Diagnosis Date   Anemia    Bifascicular block 10/02/2014   Depression    Excess ear wax    Multinodular goiter    Myotonic dystrophy, type 1 ([place]) dx'd ~ 2005   Genetically confirmed   Pneumonia    Presence of permanent cardiac pacemaker    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   Sickle cell trait ([place])    Watery eyes    Left   Weakness of both legs     Past Surgical History:  Procedure Laterality Date   BREAST BIOPSY Right    EYE SURGERY Left 2017   "related to blockage in my nose"   EYE SURGERY Right 05/2019   INSERT / REPLACE / REMOVE PACEMAKER  02/15/2017   PACEMAKER IMPLANT N/A 02/15/2017   Procedure: Pacemaker Implant;  Surgeon: Deboraha Sprang, MD;  Location: [place] [facility];  Service: Cardiovascular;  Laterality: N/A;    MEDICATIONS:  albuterol (VENTOLIN HFA) 108 (90 Base) MCG/ACT inhaler   gabapentin (NEURONTIN) 250 MG/5ML solution   hydrocortisone (ANUSOL-HC) 2.5 % rectal cream   tiZANidine (ZANAFLEX) 4 MG tablet   No current facility-administered medications for this encounter.    Myra Gianotti, PA-C Surgical Short Stay/Anesthesiology [facility] Phone [phone] [facility] Phone [phone] 03/23/2021 5:47 PM

## 2021-03-23 NOTE — Anesthesia Preprocedure Evaluation (Addendum)
Anesthesia Evaluation  Patient identified by MRN, date of birth, ID band Patient awake    Reviewed: Allergy & Precautions, NPO status , Patient's Chart, lab work & pertinent test results  Airway Mallampati: II  TM Distance: >3 FB Neck ROM: Full   Comment: Large goiter Dental  (+) Poor Dentition, Missing   Pulmonary Patient abstained from smoking., former smoker,    Pulmonary exam normal breath sounds clear to auscultation       Cardiovascular METS: 3 - Mets Normal cardiovascular exam+ dysrhythmias (RBBB; left anterior fascicular block) + pacemaker  Rhythm:Regular Rate:Normal     Neuro/Psych PSYCHIATRIC DISORDERS Depression  Neuromuscular disease (myotonic dystrophy, type 1)    GI/Hepatic dysphagia   Endo/Other  Hypothyroidism (goiter)   Renal/GU      Musculoskeletal negative musculoskeletal ROS (+)   Abdominal   Peds negative pediatric ROS (+)  Hematology  (+) Sickle cell trait ,   Anesthesia Other Findings Voice change  Reproductive/Obstetrics negative OB ROS                           Anesthesia Physical Anesthesia Plan  ASA: 3  Anesthesia Plan: General   Post-op Pain Management:    Induction:   PONV Risk Score and Plan: Ondansetron, Propofol infusion, TIVA and Treatment may vary due to age or medical condition  Airway Management Planned:   Additional Equipment: None  Intra-op Plan:   Post-operative Plan: Extubation in OR  Informed Consent: I have reviewed the patients History and Physical, chart, labs and discussed the procedure including the risks, benefits and alternatives for the proposed anesthesia with the patient or authorized representative who has indicated his/her understanding and acceptance.     Dental advisory given  Plan Discussed with: CRNA and Anesthesiologist  Anesthesia Plan Comments: (GETA. TIVA given myotonic dystrophy. Rocuronium/sugammadex.  Pacemaker to be evaluated/reprogrammed. Glidescope for intubation given large goiter.  Norton Blizzard, MD      See PAT note written 03/23/2021 by Myra Gianotti, PA-C. Cased rescheduled for 04/21/21.  History includes former smoker (quit 10/04/16), myotonic dystrophy type 1 (diagnosed ~ 6387, disease complicated by cataracts, 2nd degree AVB s/p PPM, dyspnea, dysphagia and has had mild progression of bilateral foot drop and distal hand weakness at there 09/24/20 neurology follow-up), heart block (bifascicular block, Mobitz type 2 block, s/p Medtronic Azure XT DR MRI [id number] dual chamber HIS bundle PPM with septal capture 02/15/17), Sickle cell trait, anemia, COVID-19 (s/p monoclonal antibodies 08/30/20).  Last visit with Dr. Radford Pax for follow-up and preoperative evaluation was on 01/10/2021. She notes: "1. Myotonic dystrophy - cardiac MRI showedmyocardial thinning associated with hypokinesis and subendocardial late gadolinium enhancement in the mid inferior and inferolateral walls and cardiac -FDG-PET scan at [facility] was normal and therefore her cardiac MRI findings were more likely to represent myotonic dystrophythan sarcoidosis. -Repeat 2D echo 05/24/2018 showed normal LV function with EF 55% and grade 1 diastolic dysfunction. There was mild RV dysfunction felt likely related to chronic V pacing.  -her last2Decho was in 2020 and showed normal LVF with EF 60-65% with normal RV  2. RBBB with LAFB and high grade 2nd degree AV block -s/p MDT dual chamber (HIS bundle) PPM -she is followed in device clinic and is atrial sensed ventricular paced today. -EKG done today in the office was personally reviewed and showed NSR with V pacing  3. SOB - this was felt in the past to be due to underlying pulmonary etiology as her cardiac  workup including MRI and echo showed normal LVF. -She was referred to Pulmonary and felt not to have asthma or lung tissue disease by high resolution Chest Ct. -t was felt  that her SOB was likely due to neuromuscular disease and was placed on BiPAP due to hypercarbia. -she did not tolerate BiPAP and is no longer using it.  4. Preoperative Cardiac Clearance -she is asymptomatic from a cardiac standpoint. -she is able to achieve mets without symptoms and therefore no ischemic cardiac workup needed -I will repeat a 2D echo to ensure that LVF is still normal -she has a periop risk of major cardiac event of 0.4% based on the Revised Cardiac Risk Index"  She had the eco on 01/13/21 and showed normal LV and RV function. )     Anesthesia Quick Evaluation

## 2021-03-30 ENCOUNTER — Other Ambulatory Visit (HOSPITAL_COMMUNITY)
Admission: RE | Admit: 2021-03-30 | Discharge: 2021-03-30 | Disposition: A | Discharge status: Home / Self Care | Payer: Medicare Other | Source: Ambulatory Visit | Attending: General Surgery | Admitting: General Surgery

## 2021-03-30 DIAGNOSIS — Z01812 Encounter for preprocedural laboratory examination: Secondary | ICD-10-CM | POA: Insufficient documentation

## 2021-03-30 DIAGNOSIS — Z20822 Contact with and (suspected) exposure to covid-19: Secondary | ICD-10-CM | POA: Diagnosis not present

## 2021-03-30 LAB — SARS CORONAVIRUS 2 (TAT 6-24 HRS): SARS Coronavirus 2: NEGATIVE

## 2021-04-20 ENCOUNTER — Other Ambulatory Visit: Payer: Self-pay

## 2021-04-20 ENCOUNTER — Ambulatory Visit: Payer: Self-pay | Admitting: General Surgery

## 2021-04-20 ENCOUNTER — Encounter (HOSPITAL_COMMUNITY): Payer: Self-pay | Admitting: General Surgery

## 2021-04-20 ENCOUNTER — Encounter: Payer: Self-pay | Admitting: Internal Medicine

## 2021-04-20 NOTE — Progress Notes (Signed)
Medtronic Pacemaker Peri-op prescrioption ICD faxed awaiting results. 02/15/21 last remote device check.  Gae Dry with [employer] notified of pts procedure date and time Total Thyroidectomy 04/21/2021 pt will be here at 0530 for 0730 surgery. Gae Dry to evaluate the device day of surgery.

## 2021-04-20 NOTE — H&P (Signed)
  History of Present Illness  The patient is a 50 year old female who presents with a thyroid nodule.Patient follow back up today from or enlarged thyroid gland. Patient has had evaluation by Dr. Erik Obey ENT.  Patient underwent CT scan as well as swallow study.  Patient states that he was not concerned with her voice as this is been her normal voice recently with the thyromegaly. CT scan does not show any extension substernally.  I did review this personally. Patient continues with dysphagia as well as discomfort secondary to her thyromegaly.   Patient sees Dr. Caryl Comes her electrophysiologist for her pacemaker Patient sees Dr. Radford Pax cardiologist.   -------------------------------------------------- Chief Complaint: Thyroid nodule.   Patient is a 50 year old female, who comes in secondary to enlarged thyroid gland. Patient's had discovered this recently.  She states that she's had some dysphagia for a long period of time.  She states that she has troubles with both liquids and solids however solids or worse.  She states that any liquids, but her nose.  Patient states that she also has noticed a voice change.  She feels that her voice is a lot deeper that have been in the past. Patient with ultrasound which I reviewed personally.  This did reveal a 2.6 x 2 cm left thyroid nodule.  Patient's thyroid function tests within normal limits.  I did review these as well.   Patient denies any family history of any thyroid cancers.   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  She did not have any treatment for this.         Allergies  Penicillin G Potassium *PENICILLINS*   Rash. Allergies Reconciled     Medication History  Albuterol Sulfate HFA  (108 (90 Base)MCG/ACT Aerosol Soln, Inhalation) Active. Hydrocortisone (Perianal)  (2.5% Cream, External) Active. tiZANidine HCl  ('4MG'$  Tablet, Oral) Active. Medications  Reconciled        Review of Systems  All other systems negative     Physical Exam The physical exam findings are as follows: Note:  Constitutional: No acute distress, conversant, appears stated age, harsh voice   Eyes: Anicteric sclerae, moist conjunctiva, no lid lag   Neck: thyromegaly-firm, trachea midline, no cervical lymphadenopathy   Lungs: Clear to auscultation biilaterally, normal respiratory effot   Cardiovascular: regular rate & rhythm, no murmurs, no peripheal edema, pedal pulses 2+   GI: Soft, no masses or hepatosplenomegaly, non-tender to palpation   MSK: Normal gait, no clubbing cyanosis, edema   Skin: No rashes, palpation reveals normal skin turgor   Psychiatric: Appropriate judgment and insight, oriented to person, place, and time       Assessment & PlanTHYROID NODULE (E04.1) Impression: 50 year old female with thyromegaly, compressive symptoms 1. Will proceed to the operating for total thyroidectomy. 2. I discussed with her the risks benefits of the procedure to include but aren't limited to: Infection, bleeding, damage to structures, possible need for tracheostomy, possibly further surgery. Also discussed with patient that she will require lifelong thyroid replacement therapy. Patient voiced understanding and wished to proceed.

## 2021-04-20 NOTE — Progress Notes (Signed)
[place] DEVICE PROGRAMMING  Patient Information: Name:  Cynthia Underwood  DOB:  1971/08/30  MRN:  [id number]    Planned Procedure:  TOTAL THYROIDECTOMY  Surgeon:  Dr Rosendo Gros  Date of Procedure:  04/21/21  Cautery will be used.  Position during surgery:  supine   Please send documentation back to:  [facility] (Fax # [phone])  Device Information:  Clinic EP Physician:  Virl Axe, MD   Device Type:  Pacemaker Manufacturer and Phone #:  Medtronic: [phone] Pacemaker Dependent?:  Unknown (patient is overdue in-clinic check Date of Last Device Check:  02/21/21 (remote) Normal Device Function?:  Yes.    Electrophysiologist's Recommendations:  Have magnet available. Provide continuous ECG monitoring when magnet is used or reprogramming is to be performed.  Procedure may interfere with device function.  Magnet should be placed over device during procedure. Industry will need to check patients pacemaker prior to procedure since she is over due for in-clinic testing.   Per Device Clinic Standing Orders, Simone Curia, RN  12:58 PM 04/20/2021

## 2021-04-21 ENCOUNTER — Ambulatory Visit (HOSPITAL_COMMUNITY): Payer: Medicare Other | Admitting: Vascular Surgery

## 2021-04-21 ENCOUNTER — Other Ambulatory Visit: Payer: Self-pay

## 2021-04-21 ENCOUNTER — Encounter (HOSPITAL_COMMUNITY): Payer: Self-pay | Admitting: General Surgery

## 2021-04-21 ENCOUNTER — Observation Stay (HOSPITAL_COMMUNITY)
Admission: RE | Admit: 2021-04-21 | Discharge: 2021-04-22 | Disposition: A | Discharge status: Home / Self Care | Payer: Medicare Other | Attending: General Surgery | Admitting: General Surgery

## 2021-04-21 ENCOUNTER — Ambulatory Visit (HOSPITAL_COMMUNITY): Payer: Medicare Other | Admitting: Physician Assistant

## 2021-04-21 ENCOUNTER — Encounter (HOSPITAL_COMMUNITY)
Admission: RE | Disposition: A | Discharge status: Home / Self Care | Payer: Self-pay | Source: Home / Self Care | Attending: General Surgery

## 2021-04-21 DIAGNOSIS — E890 Postprocedural hypothyroidism: Secondary | ICD-10-CM

## 2021-04-21 DIAGNOSIS — E041 Nontoxic single thyroid nodule: Secondary | ICD-10-CM | POA: Diagnosis present

## 2021-04-21 DIAGNOSIS — E069 Thyroiditis, unspecified: Secondary | ICD-10-CM | POA: Diagnosis not present

## 2021-04-21 DIAGNOSIS — Z20822 Contact with and (suspected) exposure to covid-19: Secondary | ICD-10-CM | POA: Insufficient documentation

## 2021-04-21 DIAGNOSIS — E063 Autoimmune thyroiditis: Secondary | ICD-10-CM | POA: Diagnosis not present

## 2021-04-21 DIAGNOSIS — I452 Bifascicular block: Secondary | ICD-10-CM | POA: Diagnosis not present

## 2021-04-21 DIAGNOSIS — F32A Depression, unspecified: Secondary | ICD-10-CM | POA: Diagnosis not present

## 2021-04-21 DIAGNOSIS — E0520 Thyrotoxicosis with toxic multinodular goiter without thyrotoxic crisis or storm: Secondary | ICD-10-CM | POA: Diagnosis not present

## 2021-04-21 HISTORY — PX: THYROIDECTOMY: SHX17

## 2021-04-21 HISTORY — DX: Dyspnea, unspecified: R06.00

## 2021-04-21 LAB — SARS CORONAVIRUS 2 BY RT PCR (HOSPITAL ORDER, PERFORMED IN ~~LOC~~ HOSPITAL LAB): SARS Coronavirus 2: NEGATIVE

## 2021-04-21 SURGERY — THYROIDECTOMY
Anesthesia: General | Site: Neck

## 2021-04-21 MED ORDER — FENTANYL CITRATE (PF) 100 MCG/2ML IJ SOLN
25.0000 ug | INTRAMUSCULAR | Status: DC | PRN
  Administered 2021-04-21: 25 ug via INTRAVENOUS

## 2021-04-21 MED ORDER — TRAMADOL HCL 50 MG PO TABS: 50.0000 mg | ORAL_TABLET | Freq: Four times a day (QID) | ORAL | Status: DC | PRN

## 2021-04-21 MED ORDER — VANCOMYCIN HCL IN DEXTROSE 1-5 GM/200ML-% IV SOLN
INTRAVENOUS | Status: AC
  Filled 2021-04-21: qty 200

## 2021-04-21 MED ORDER — 0.9 % SODIUM CHLORIDE (POUR BTL) OPTIME
TOPICAL | Status: DC | PRN
  Administered 2021-04-21: 1000 mL

## 2021-04-21 MED ORDER — FENTANYL CITRATE (PF) 250 MCG/5ML IJ SOLN
INTRAMUSCULAR | Status: AC
  Filled 2021-04-21: qty 5

## 2021-04-21 MED ORDER — OXYCODONE HCL 5 MG/5ML PO SOLN: 5.0000 mg | Freq: Once | ORAL | Status: AC | PRN

## 2021-04-21 MED ORDER — ONDANSETRON HCL 4 MG/2ML IJ SOLN: 4.0000 mg | Freq: Four times a day (QID) | INTRAMUSCULAR | Status: DC | PRN

## 2021-04-21 MED ORDER — ONDANSETRON HCL 4 MG/2ML IJ SOLN
INTRAMUSCULAR | Status: DC | PRN
  Administered 2021-04-21: 4 mg via INTRAVENOUS

## 2021-04-21 MED ORDER — ONDANSETRON HCL 4 MG/2ML IJ SOLN: 4.0000 mg | Freq: Once | INTRAMUSCULAR | Status: DC | PRN

## 2021-04-21 MED ORDER — HYDROMORPHONE HCL 1 MG/ML IJ SOLN
1.0000 mg | INTRAMUSCULAR | Status: DC | PRN
Start: 2021-04-21 — End: 2021-04-22
  Administered 2021-04-21 (×2): 1 mg via INTRAVENOUS
  Filled 2021-04-21 (×2): qty 1

## 2021-04-21 MED ORDER — DEXAMETHASONE SODIUM PHOSPHATE 10 MG/ML IJ SOLN
INTRAMUSCULAR | Status: AC
  Filled 2021-04-21: qty 1

## 2021-04-21 MED ORDER — PROPOFOL 500 MG/50ML IV EMUL
INTRAVENOUS | Status: DC | PRN
Start: 2021-04-21 — End: 2021-04-21
  Administered 2021-04-21: 100 ug/kg/min via INTRAVENOUS

## 2021-04-21 MED ORDER — LACTATED RINGERS IV SOLN: INTRAVENOUS | Status: DC | PRN

## 2021-04-21 MED ORDER — DEXAMETHASONE SODIUM PHOSPHATE 10 MG/ML IJ SOLN
INTRAMUSCULAR | Status: DC | PRN
  Administered 2021-04-21: 4 mg via INTRAVENOUS

## 2021-04-21 MED ORDER — HEMOSTATIC AGENTS (NO CHARGE) OPTIME
TOPICAL | Status: DC | PRN
  Administered 2021-04-21: 1 via TOPICAL

## 2021-04-21 MED ORDER — ACETAMINOPHEN 500 MG PO TABS
1000.0000 mg | ORAL_TABLET | Freq: Once | ORAL | Status: AC
  Administered 2021-04-21: 500 mg via ORAL
  Filled 2021-04-21: qty 2

## 2021-04-21 MED ORDER — ORAL CARE MOUTH RINSE: 15.0000 mL | Freq: Once | OROMUCOSAL | Status: AC

## 2021-04-21 MED ORDER — BUPIVACAINE-EPINEPHRINE 0.25% -1:200000 IJ SOLN
INTRAMUSCULAR | Status: DC | PRN
  Administered 2021-04-21: 10 mL

## 2021-04-21 MED ORDER — CHLORHEXIDINE GLUCONATE 0.12 % MT SOLN
15.0000 mL | Freq: Once | OROMUCOSAL | Status: AC
  Administered 2021-04-21: 15 mL via OROMUCOSAL
  Filled 2021-04-21: qty 15

## 2021-04-21 MED ORDER — MIDAZOLAM HCL 5 MG/5ML IJ SOLN
INTRAMUSCULAR | Status: DC | PRN
  Administered 2021-04-21: 2 mg via INTRAVENOUS

## 2021-04-21 MED ORDER — ONDANSETRON HCL 4 MG/2ML IJ SOLN
INTRAMUSCULAR | Status: AC
  Filled 2021-04-21: qty 2

## 2021-04-21 MED ORDER — VANCOMYCIN HCL 1000 MG IV SOLR
INTRAVENOUS | Status: DC | PRN
  Administered 2021-04-21: 1000 mg via INTRAVENOUS

## 2021-04-21 MED ORDER — LIDOCAINE 2% (20 MG/ML) 5 ML SYRINGE
INTRAMUSCULAR | Status: AC
  Filled 2021-04-21: qty 5

## 2021-04-21 MED ORDER — PHENOL 1.4 % MT LIQD: 1.0000 | OROMUCOSAL | Status: DC | PRN

## 2021-04-21 MED ORDER — PROPOFOL 10 MG/ML IV BOLUS
INTRAVENOUS | Status: DC | PRN
  Administered 2021-04-21: 120 mg via INTRAVENOUS
  Administered 2021-04-21: 20 mg via INTRAVENOUS

## 2021-04-21 MED ORDER — DEXTROSE-NACL 5-0.9 % IV SOLN: INTRAVENOUS | Status: DC

## 2021-04-21 MED ORDER — FENTANYL CITRATE (PF) 100 MCG/2ML IJ SOLN
INTRAMUSCULAR | Status: AC
  Filled 2021-04-21: qty 2

## 2021-04-21 MED ORDER — ROCURONIUM BROMIDE 10 MG/ML (PF) SYRINGE
PREFILLED_SYRINGE | INTRAVENOUS | Status: AC
  Filled 2021-04-21: qty 10

## 2021-04-21 MED ORDER — SUGAMMADEX SODIUM 200 MG/2ML IV SOLN
INTRAVENOUS | Status: DC | PRN
  Administered 2021-04-21: 100 mg via INTRAVENOUS

## 2021-04-21 MED ORDER — AMISULPRIDE (ANTIEMETIC) 5 MG/2ML IV SOLN: 10.0000 mg | Freq: Once | INTRAVENOUS | Status: DC | PRN

## 2021-04-21 MED ORDER — PROPOFOL 10 MG/ML IV BOLUS
INTRAVENOUS | Status: AC
  Filled 2021-04-21: qty 20

## 2021-04-21 MED ORDER — LIDOCAINE 2% (20 MG/ML) 5 ML SYRINGE
INTRAMUSCULAR | Status: DC | PRN
  Administered 2021-04-21: 80 mg via INTRAVENOUS

## 2021-04-21 MED ORDER — LACTATED RINGERS IV SOLN: INTRAVENOUS | Status: DC

## 2021-04-21 MED ORDER — HYDROCODONE-ACETAMINOPHEN 5-325 MG PO TABS
1.0000 | ORAL_TABLET | ORAL | Status: DC | PRN
  Administered 2021-04-22: 2 via ORAL
  Filled 2021-04-21: qty 2

## 2021-04-21 MED ORDER — OXYCODONE HCL 5 MG PO TABS
5.0000 mg | ORAL_TABLET | Freq: Once | ORAL | Status: AC | PRN
  Administered 2021-04-21: 5 mg via ORAL

## 2021-04-21 MED ORDER — FENTANYL CITRATE (PF) 250 MCG/5ML IJ SOLN
INTRAMUSCULAR | Status: DC | PRN
  Administered 2021-04-21: 50 ug via INTRAVENOUS

## 2021-04-21 MED ORDER — BUPIVACAINE-EPINEPHRINE (PF) 0.25% -1:200000 IJ SOLN
INTRAMUSCULAR | Status: AC
  Filled 2021-04-21: qty 30

## 2021-04-21 MED ORDER — PHENYLEPHRINE 40 MCG/ML (10ML) SYRINGE FOR IV PUSH (FOR BLOOD PRESSURE SUPPORT)
PREFILLED_SYRINGE | INTRAVENOUS | Status: AC
  Filled 2021-04-21: qty 10

## 2021-04-21 MED ORDER — ONDANSETRON 4 MG PO TBDP: 4.0000 mg | ORAL_TABLET | Freq: Four times a day (QID) | ORAL | Status: DC | PRN

## 2021-04-21 MED ORDER — MIDAZOLAM HCL 2 MG/2ML IJ SOLN
INTRAMUSCULAR | Status: AC
  Filled 2021-04-21: qty 2

## 2021-04-21 MED ORDER — ROCURONIUM BROMIDE 10 MG/ML (PF) SYRINGE
PREFILLED_SYRINGE | INTRAVENOUS | Status: DC | PRN
  Administered 2021-04-21: 50 mg via INTRAVENOUS

## 2021-04-21 MED ORDER — OXYCODONE HCL 5 MG PO TABS
ORAL_TABLET | ORAL | Status: AC
  Filled 2021-04-21: qty 1

## 2021-04-21 SURGICAL SUPPLY — 45 items
ATTRACTOMAT 16X20 MAGNETIC DRP (DRAPES) ×2 IMPLANT
BAG COUNTER SPONGE SURGICOUNT (BAG) ×2 IMPLANT
CANISTER SUCT 3000ML PPV (MISCELLANEOUS) ×2 IMPLANT
CHLORAPREP W/TINT 26 (MISCELLANEOUS) ×4 IMPLANT
CLIP TI MEDIUM 24 (CLIP) ×4 IMPLANT
CLIP TI WIDE RED SMALL 24 (CLIP) ×4 IMPLANT
CLIP VESOCCLUDE SM WIDE 24/CT (CLIP) ×2 IMPLANT
CNTNR URN SCR LID CUP LEK RST (MISCELLANEOUS) IMPLANT
CONT SPEC 4OZ STRL OR WHT (MISCELLANEOUS)
COVER SURGICAL LIGHT HANDLE (MISCELLANEOUS) ×2 IMPLANT
DERMABOND ADVANCED (GAUZE/BANDAGES/DRESSINGS) ×1
DERMABOND ADVANCED .7 DNX12 (GAUZE/BANDAGES/DRESSINGS) ×1 IMPLANT
DRAPE LAPAROTOMY 100X72 PEDS (DRAPES) ×2 IMPLANT
ELECT COATED BLADE 2.86 ST (ELECTRODE) ×2 IMPLANT
ELECT REM PT RETURN 9FT ADLT (ELECTROSURGICAL) ×2
ELECTRODE REM PT RTRN 9FT ADLT (ELECTROSURGICAL) ×1 IMPLANT
GAUZE 4X4 16PLY ~~LOC~~+RFID DBL (SPONGE) ×2 IMPLANT
GAUZE SPONGE 4X4 12PLY STRL (GAUZE/BANDAGES/DRESSINGS) ×2 IMPLANT
GLOVE SURG ENC MOIS LTX SZ7.5 (GLOVE) ×2 IMPLANT
GOWN STRL REUS W/ TWL LRG LVL3 (GOWN DISPOSABLE) ×1 IMPLANT
GOWN STRL REUS W/ TWL XL LVL3 (GOWN DISPOSABLE) ×1 IMPLANT
GOWN STRL REUS W/TWL LRG LVL3 (GOWN DISPOSABLE) ×2
GOWN STRL REUS W/TWL XL LVL3 (GOWN DISPOSABLE) ×2
HEMOSTAT SURGICEL 2X4 FIBR (HEMOSTASIS) ×4 IMPLANT
ILLUMINATOR WAVEGUIDE N/F (MISCELLANEOUS) ×2 IMPLANT
KIT BASIN OR (CUSTOM PROCEDURE TRAY) ×2 IMPLANT
KIT TURNOVER KIT B (KITS) ×2 IMPLANT
NEEDLE HYPO 25GX1X1/2 BEV (NEEDLE) ×2 IMPLANT
NS IRRIG 1000ML POUR BTL (IV SOLUTION) ×2 IMPLANT
PACK GENERAL/GYN (CUSTOM PROCEDURE TRAY) ×2 IMPLANT
PAD ARMBOARD 7.5X6 YLW CONV (MISCELLANEOUS) ×2 IMPLANT
PENCIL SMOKE EVACUATOR (MISCELLANEOUS) ×2 IMPLANT
POSITIONER HEAD DONUT 9IN (MISCELLANEOUS) ×2 IMPLANT
SHEARS HARMONIC 9CM CVD (BLADE) ×2 IMPLANT
SPECIMEN JAR MEDIUM (MISCELLANEOUS) IMPLANT
SPONGE INTESTINAL PEANUT (DISPOSABLE) ×2 IMPLANT
SUT MNCRL AB 4-0 PS2 18 (SUTURE) ×2 IMPLANT
SUT SILK 2 0 (SUTURE) ×2
SUT SILK 2-0 18XBRD TIE 12 (SUTURE) ×1 IMPLANT
SUT SILK 3 0 (SUTURE) ×2
SUT SILK 3-0 18XBRD TIE 12 (SUTURE) ×1 IMPLANT
SUT VIC AB 3-0 SH 18 (SUTURE) ×4 IMPLANT
SYR CONTROL 10ML LL (SYRINGE) ×2 IMPLANT
TOWEL GREEN STERILE (TOWEL DISPOSABLE) ×2 IMPLANT
TOWEL GREEN STERILE FF (TOWEL DISPOSABLE) ×2 IMPLANT

## 2021-04-21 NOTE — Transfer of Care (Signed)
Immediate Anesthesia Transfer of Care Note  Patient: Cynthia Underwood  Procedure(s) Performed: TOTAL THYROIDECTOMY (Neck)  Patient Location: PACU  Anesthesia Type:General  Level of Consciousness: awake, alert  and oriented  Airway & Oxygen Therapy: Patient Spontanous Breathing  Post-op Assessment: Report given to RN, Post -op Vital signs reviewed and stable and Patient moving all extremities X 4  Post vital signs: Reviewed and stable  Last Vitals:  Vitals Value Taken Time  BP    Temp    Pulse    Resp    SpO2      Last Pain:  Vitals:   04/21/21 0638  TempSrc:   PainSc: 0-No pain         Complications: No notable events documented.

## 2021-04-21 NOTE — Anesthesia Postprocedure Evaluation (Signed)
Anesthesia Post Note  Patient: Cynthia Underwood  Procedure(s) Performed: TOTAL THYROIDECTOMY (Neck)     Patient location during evaluation: PACU Anesthesia Type: General Level of consciousness: awake Pain management: pain level controlled Vital Signs Assessment: post-procedure vital signs reviewed and stable Respiratory status: spontaneous breathing and respiratory function stable Cardiovascular status: stable Postop Assessment: no apparent nausea or vomiting Anesthetic complications: no   No notable events documented.  Last Vitals:  Vitals:   04/21/21 1123 04/21/21 1426  BP: 128/72 110/73  Pulse: 62 60  Resp: 11 17  Temp: 36.4 C 36.9 C  SpO2: 100% 92%    Last Pain:  Vitals:   04/21/21 1426  TempSrc: Oral  PainSc:                  Merlinda Frederick

## 2021-04-21 NOTE — Op Note (Signed)
04/21/2021  9:18 AM  PATIENT:  Cynthia Underwood  50 y.o. female  PRE-OPERATIVE DIAGNOSIS:  THYROID GOITER  POST-OPERATIVE DIAGNOSIS:  THYROID GOITER  PROCEDURE:  Procedure(s): TOTAL THYROIDECTOMY (N/A)  SURGEON:  Surgeon(s) and Role:    * Ralene Ok, MD - Primary    Armandina Gemma, MD - Assisting who was essential with retraction and identification of the anatomy.  ANESTHESIA:   local and general  EBL:  50 mL   BLOOD ADMINISTERED:none  DRAINS: none   LOCAL MEDICATIONS USED:  BUPIVICAINE   SPECIMEN:  Source of Specimen:  total thyroid with stitch in left superior lobe  DISPOSITION OF SPECIMEN:  PATHOLOGY  COUNTS:  YES  TOURNIQUET:  * No tourniquets in log *  DICTATION: .Dragon Dictation Indication procedure: Patient is a 50 year old female who was seen in clinic secondary to a large thyroid goiter.  Patient had compressive symptoms.  Patient was taken back to the operating room electively for total thyroidectomy.  ENT was consulted secondary to voice change.  Findings: Patient had a large what he thyroid.  The left and right recurrent large amounts were identified.  The left superior and inferior parathyroids are identified as well as the right superior parathyroid gland.  These were all preserved.  Complications: none   Counts: reported as correct x 2   Details of the procedure:  The patient was taken back to the operating room. The patient was placed in supine position with bilateral SCDs in place.  The patient was prepped and draped in the usual sterile fashion. After appropriate anitbiotics were confirmed, a time-out was confirmed and all facts were verified.   A 5 cm incision was made approximately 2 fingerbreadths above the sternal notch. Bovie cautery was used to maintain hemostasis dissection was carried down through the platysma. The platysma was elevated and flaps were created superiorly and inferiorly to the thyroid cartilage as well as the sternal  notch, repsectively. The strap muscles were identified in the midline and separated.  Left-sided strap muscles were elevated off the anterior surface of the thyroid.  The thyroid was very firm and woody.  This was retracted medially.  This dissection was carried laterally. The middle thyroid vein was identified and doubly ligated. We proceeded to dissect away the superior lobe and Kitners were used to gently dissect the surrounding musculature from the thyroid. The superior thyroid vessels were identified and doubly ligated with clips and transected with a Harmonic scalpel. At this time this freed up the superior lobe was able to deliver this into the wound. We also identified the superior parathyroid gland which we preserved.   We continued to dissect the thyroid off of the trachea from lateral to medial direction. The left recurrent laryngeal nerve was identified and preserved.  We also Right on the surface of the thyroid lobe.  The inferior thyroid vessels were identified ligated with clips.  The left inferior parathyroid gland was also visualized and preserved.  At this time Berry's ligament was dissected away from the trachea. This delivered the left lobe of the thyroid into the wound and the harmonic scalpel was used to divide the thyroid in the midline.   At this time we continued to dissect the right side.  The strap muscles were dissected off the anterior surface of the right thyroid lobe.  The thyroid lobe was woody and firm.  The thyroid lobe was then retracted medially.  We continued to proceed the right superior lobe.  This was dissected  away from the superior vessels.  This was doubly ligated with clips and transected with harmonic scalpel.  We continued to proceed to dissect inferiorly.  The right superior parathyroid gland was visualized and preserved.  We continued to dissect down to the middle colic artery.  This was doubly ligated and transected with the harmonic device.  The right recurrent  laryngeal nerve was identified and preserved.  Dissection was kept right on the thyroid gland.  We continued to dissect inferiorly.  The inferior vessels were doubly ligated and transected using a harmonic scalpel.  We continued the dissection medially and take the thyroid off the trachea.  At this time the left superior thyroid lobe was marked with a stitch.  The area was irrigated out. The dissection bed was hemostatic. We placed fibrillar hemostatic agent into the wound. Strap muscles were then reapproximated in the midline with interrupted 3-0 Vicryl stitches. The platysma was reapproximated using 3-0 Vicryl stitches in interrupted fashion. Skin was then reapproximated using a running subcuticular 4-0 Monocryl. The skin was then dressed with Dermabond. The patient was taken to the recovery room in stable condition.   PLAN OF CARE: Admit for overnight observation  PATIENT DISPOSITION:  PACU - hemodynamically stable.   Delay start of Pharmacological VTE agent (>24hrs) due to surgical blood loss or risk of bleeding: not applicable

## 2021-04-21 NOTE — Anesthesia Procedure Notes (Addendum)
Procedure Name: Intubation Date/Time: 04/21/2021 7:38 AM Performed by: Harden Mo, CRNA Pre-anesthesia Checklist: Patient identified, Emergency Drugs available, Suction available, Patient being monitored and Timeout performed Patient Re-evaluated:Patient Re-evaluated prior to induction Oxygen Delivery Method: Circle System Utilized Preoxygenation: Pre-oxygenation with 100% oxygen Induction Type: IV induction Ventilation: Mask ventilation without difficulty Laryngoscope Size: 3 and Glidescope Grade View: Grade I Tube type: Oral Tube size: 6.0 mm Number of attempts: 1 Airway Equipment and Method: Stylet Placement Confirmation: ETT inserted through vocal cords under direct vision, positive ETCO2 and breath sounds checked- equal and bilateral Secured at: 20 cm Tube secured with: Tape Dental Injury: Teeth and Oropharynx as per pre-operative assessment  Comments: Dory Larsen, SRNA

## 2021-04-21 NOTE — Progress Notes (Signed)
Cynthia Underwood is a 50 y.o. female patient admitted. Awake, alert - oriented  X 4 - no acute distress noted.  VSS - Blood pressure 110/73, pulse 60, temperature 98.4 F (36.9 C), temperature source Oral, resp. rate 17, height '5\' 3"'$  (1.6 m), weight 39.9 kg, last menstrual period 12/07/2016, SpO2 92 %.    IV in place, occlusive dsg intact without redness.  Orientation to room, and floor completed.  Admission INP armband ID verified with patient/family, and in place.   SR up x 2, fall assessment complete, with patient and family able to verbalize understanding of risk associated with falls, and verbalized understanding to call nsg before up out of bed.  Call light within reach, patient able to voice, and demonstrate understanding. No evidence of skin break down noted on exam  Will cont to eval and treat per MD orders.  Caydan Mctavish, RN 04/21/2021 8:00 PM

## 2021-04-21 NOTE — Progress Notes (Signed)
Ronalee Belts w/[employer] notified that he will need to be present for pt's surgery this morning; reported that he would be present.

## 2021-04-22 ENCOUNTER — Encounter (HOSPITAL_COMMUNITY): Payer: Self-pay | Admitting: General Surgery

## 2021-04-22 DIAGNOSIS — E069 Thyroiditis, unspecified: Secondary | ICD-10-CM | POA: Diagnosis not present

## 2021-04-22 DIAGNOSIS — Z20822 Contact with and (suspected) exposure to covid-19: Secondary | ICD-10-CM | POA: Diagnosis not present

## 2021-04-22 LAB — BASIC METABOLIC PANEL
Anion gap: 7 (ref 5–15)
BUN: 5 mg/dL — ABNORMAL LOW (ref 6–20)
CO2: 28 mmol/L (ref 22–32)
Calcium: 8.7 mg/dL — ABNORMAL LOW (ref 8.9–10.3)
Chloride: 106 mmol/L (ref 98–111)
Creatinine, Ser: 0.42 mg/dL — ABNORMAL LOW (ref 0.44–1.00)
GFR, Estimated: >60 mL/min (ref >60)
Glucose, Bld: 137 mg/dL — ABNORMAL HIGH (ref 70–99)
Potassium: 3.5 mmol/L (ref 3.5–5.1)
Sodium: 141 mmol/L (ref 135–145)

## 2021-04-22 MED ORDER — CALCIUM CARBONATE 1250 (500 CA) MG PO TABS: 2.0000 | ORAL_TABLET | Freq: Every day | ORAL | 1 refills | Status: DC

## 2021-04-22 MED ORDER — MAGNESIUM OXIDE -MG SUPPLEMENT 400 (240 MG) MG PO TABS
400.0000 mg | ORAL_TABLET | Freq: Every day | ORAL | Status: DC
  Administered 2021-04-22: 400 mg via ORAL
  Filled 2021-04-22: qty 1

## 2021-04-22 MED ORDER — MAGNESIUM OXIDE -MG SUPPLEMENT 400 (240 MG) MG PO TABS: 400.0000 mg | ORAL_TABLET | Freq: Every day | ORAL | 2 refills | Status: DC

## 2021-04-22 MED ORDER — LEVOTHYROXINE SODIUM 125 MCG PO TABS: 64.0000 ug | ORAL_TABLET | Freq: Every day | ORAL | 1 refills | Status: DC

## 2021-04-22 MED ORDER — HYDROCODONE-ACETAMINOPHEN 5-325 MG PO TABS: 1.0000 | ORAL_TABLET | ORAL | 0 refills | Status: DC | PRN

## 2021-04-22 MED ORDER — CALCIUM CARBONATE 1250 (500 CA) MG PO TABS: 2.0000 | ORAL_TABLET | Freq: Every day | ORAL | Status: DC

## 2021-04-22 MED ORDER — LEVOTHYROXINE SODIUM 125 MCG PO TABS
64.0000 ug | ORAL_TABLET | Freq: Every day | ORAL | Status: DC
  Administered 2021-04-22: 62.5 ug via ORAL
  Filled 2021-04-22: qty 0.5

## 2021-04-22 NOTE — Plan of Care (Signed)
  Problem: Health Behavior/Discharge Planning: Goal: Ability to manage health-related needs will improve Outcome: Progressing   

## 2021-04-22 NOTE — Progress Notes (Signed)
1 Day Post-Op   Subjective/Chief Complaint: Some soreness this AM Speech tone low   Objective: Vital signs in last 24 hours: Temp:  [97.6 F (36.4 C)-98.4 F (36.9 C)] 97.9 F (36.6 C) (08/19 0522) Pulse Rate:  [60-76] 61 (08/19 0522) Resp:  [11-17] 17 (08/19 0522) BP: (110-155)/(19-109) 119/75 (08/19 0522) SpO2:  [92 %-100 %] 100 % (08/19 0522) Last BM Date: 04/20/21  Intake/Output from previous day: 08/18 0701 - 08/19 0700 In: 1192.9 [P.O.:120; I.V.:822.9; IV Piggyback:250] Out: 100 [Blood:100] Intake/Output this shift: No intake/output data recorded.  Incision/Wound: c/d/i  Lab Results:  No results for input(s): WBC, HGB, HCT, PLT in the last 72 hours. BMET Recent Labs    04/22/21 0228  NA 141  K 3.5  CL 106  CO2 28  GLUCOSE 137*  BUN 5*  CREATININE 0.42*  CALCIUM 8.7*   PT/INR No results for input(s): LABPROT, INR in the last 72 hours. ABG No results for input(s): PHART, HCO3 in the last 72 hours.  Invalid input(s): PCO2, PO2  Studies/Results: No results found.  Anti-infectives: Anti-infectives (From admission, onward)    Start     Dose/Rate Route Frequency Ordered Stop   04/21/21 0658  vancomycin (VANCOCIN) 1-5 GM/200ML-% IVPB       Note to Pharmacy: Jeralene Huff   : cabinet override      04/21/21 0658 04/21/21 1914       Assessment/Plan: s/p Procedure(s): TOTAL THYROIDECTOMY (N/A) Discharge later today Ice to incision site Mobilize Will start synthoid, ca supplementation  LOS: 0 days    Ralene Ok 04/22/2021

## 2021-04-24 NOTE — Discharge Summary (Signed)
Physician Discharge Summary  Patient ID: MARESHAH VISGER MRN: [id number] DOB/AGE: March 28, 1971 50 y.o.  Admit date: 04/21/2021 Discharge date: 04/24/2021  Admission Diagnoses: Thyroid goiter  Discharge Diagnoses:  Active Problems:   Thyroid nodule   S/P total thyroidectomy   Discharged Condition: good  Hospital Course: Patient was admitted postop.  Patient did well postoperatively.  Had good pain control.  Patient was on a liquid diet and tolerated that well.  Patient had good pain control.  She was ambulating well on her own.  Patient's calcium within normal limits.  We started her on Synthroid as well as calcium and magnesium.  Patient was deemed stable for discharge and discharged home.  Consults: None  Significant Diagnostic Studies: None  Treatments: surgery: As above  Discharge Exam: Blood pressure 122/80, pulse 64, temperature 97.7 F (36.5 C), temperature source Oral, resp. rate 16, height '5\' 3"'$  (1.6 m), weight 39.9 kg, last menstrual period 12/07/2016, SpO2 97 %. Incision was clean dry and intact.  Disposition: Discharge disposition: 01-Home or Self Care       Discharge Instructions     Diet - low sodium heart healthy   Complete by: As directed    Increase activity slowly   Complete by: As directed       Allergies as of 04/22/2021       Reactions   Penicillin G Other (See Comments), Rash, Nausea And Vomiting   Sore throat   Penicillins Nausea And Vomiting, Rash, Other (See Comments)   Fever Has patient had a PCN reaction causing immediate rash, facial/tongue/throat swelling, SOB or lightheadedness with hypotension: Yes Has patient had a PCN reaction causing severe rash involving mucus membranes or skin necrosis: Unknown Has patient had a PCN reaction that required hospitalization: No Has patient had a PCN reaction occurring within the last 10 years: No If all of the above answers are "NO", then may proceed with Cephalosporin use.        Medication  List     TAKE these medications    albuterol 108 (90 Base) MCG/ACT inhaler Commonly known as: VENTOLIN HFA INHALE 2 PUFFS INTO THE LUNGS EVERY 4 HOURS AS NEEDED FOR WHEEZING OR SHORTNESS OF BREATH   calcium carbonate 1250 (500 Ca) MG tablet Commonly known as: OS-CAL - dosed in mg of elemental calcium Take 2 tablets (1,000 mg of elemental calcium total) by mouth daily with breakfast.   gabapentin 250 MG/5ML solution Commonly known as: NEURONTIN Take 40m at bedtime x 1 week, then increase to 469mat bedtime.  Call the office in 3 weeks to see if the dose needs to be increased.   HYDROcodone-acetaminophen 5-325 MG tablet Commonly known as: NORCO/VICODIN Take 1-2 tablets by mouth every 4 (four) hours as needed for moderate pain.   hydrocortisone 2.5 % rectal cream Commonly known as: ANUSOL-HC Place 1 application rectally 2 (two) times daily as needed for hemorrhoids or anal itching.   levothyroxine 125 MCG tablet Commonly known as: SYNTHROID Take 0.5 tablets (62.5 mcg total) by mouth daily at 6 (six) AM.   magnesium oxide 400 (240 Mg) MG tablet Commonly known as: MAG-OX Take 1 tablet (400 mg total) by mouth daily.   tiZANidine 4 MG tablet Commonly known as: Zanaflex Take 1 tablet (4 mg total) by mouth every 8 (eight) hours as needed for muscle spasms.        Follow-up Information     RaRalene OkMD. Schedule an appointment as soon as possible for a visit in 2  week(s).   Specialty: General Surgery Why: Post op visit Contact information: [address] [place] [address] [phone]                 Signed: Ralene Ok 04/24/2021, 3:27 PM

## 2021-04-25 ENCOUNTER — Telehealth: Payer: Self-pay

## 2021-04-25 LAB — SURGICAL PATHOLOGY

## 2021-04-25 NOTE — Telephone Encounter (Signed)
Transition Care Management Follow-up Telephone Call Date of discharge and from where: 04/22/2021, [facility]  How have you been since you were released from the hospital? She said she is feeling all right.  Any questions or concerns? No  Items Reviewed: Did the pt receive and understand the discharge instructions provided? Yes  Medications obtained and verified? Yes - she has all medications except the calcium and is waiting to pick that up. She did not have any questions about her med regime at this time.  Other? No  Any new allergies since your discharge? No  Dietary orders reviewed? Yes - she stated that she is able to tolerate solids and liquids. Do you have support at home? Yes   Home Care and Equipment/Supplies: Were home health services ordered? no If so, what is the name of the agency? N/a  Has the agency set up a time to come to the patient's home? not applicable Were any new equipment or medical supplies ordered?  No What is the name of the medical supply agency? N/a Were you able to get the supplies/equipment? not applicable Do you have any questions related to the use of the equipment or supplies? No  Functional Questionnaire: (I = Independent and D = Dependent) ADLs: independent.    Follow up appointments reviewed:  PCP Hospital f/u appt confirmed?  She will  call to schedule an appointment    Specialist Hospital f/u appt confirmed?  She needs to call the surgeon today to schedule follow up.    Are transportation arrangements needed? No   She uses [facility] and is also aware that she can contact DSS for transportation to medical appointments If their condition worsens, is the pt aware to call PCP or go to the Emergency Dept.? Yes Was the patient provided with contact information for the PCP's office or ED? Yes Was to pt encouraged to call back with questions or concerns? Yes

## 2021-04-25 NOTE — Telephone Encounter (Signed)
From the discharge call:   She said she is feeling all right.  No questions/concerns at this time.  She has all medications except the calcium. she is tolerating solids and liquids  She will  call to schedule an appointment with Dr Margarita Rana and she  needs to call the surgeon today to schedule follow up

## 2021-05-03 ENCOUNTER — Encounter: Payer: Medicare Other | Admitting: Student

## 2021-05-13 NOTE — Progress Notes (Signed)
Electrophysiology Office Note Date: 05/16/2021  ID:  Cynthia Underwood, Cynthia Underwood Jan 16, 1971, MRN [id number]  PCP: Charlott Rakes, MD Primary Cardiologist: Fransico Him, MD Electrophysiologist: Virl Axe, MD   CC: Pacemaker follow-up  Cynthia Underwood is a 50 y.o. female seen today for Virl Axe, MD for routine electrophysiology followup.  Since last being seen in our clinic the patient reports doing very well. She was recently discharged from the hospital after having a thyroid goiter addressed.  she denies chest pain, palpitations, dyspnea, PND, orthopnea, nausea, vomiting, dizziness, syncope, edema, weight gain, or early satiety.  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   Sickle cell trait ([place])    Watery eyes    Left   Weakness of both legs    Past Surgical History:  Procedure Laterality Date   BREAST BIOPSY Right    EYE SURGERY Left 2017   "related to blockage in my nose"   EYE SURGERY Right 05/2019   INSERT / REPLACE / REMOVE PACEMAKER  02/15/2017   PACEMAKER IMPLANT N/A 02/15/2017   Procedure: Pacemaker Implant;  Surgeon: Deboraha Sprang, MD;  Location: [facility];  Service: Cardiovascular;  Laterality: N/A;   THYROIDECTOMY N/A 04/21/2021   Procedure: TOTAL THYROIDECTOMY;  Surgeon: Ralene Ok, MD;  Location: [facility];  Service: General;  Laterality: N/A;    Current Outpatient Medications  Medication Sig Dispense Refill   albuterol (VENTOLIN HFA) 108 (90 Base) MCG/ACT inhaler INHALE 2 PUFFS INTO THE LUNGS EVERY 4 HOURS AS NEEDED FOR WHEEZING OR SHORTNESS OF BREATH 18 g 1   calcium carbonate (OS-CAL - DOSED IN MG OF ELEMENTAL CALCIUM) 1250 (500  Ca) MG tablet Take 2 tablets (1,000 mg of elemental calcium total) by mouth daily with breakfast. 90 tablet 1   hydrocortisone (ANUSOL-HC) 2.5 % rectal cream Place 1 application rectally 2 (two) times daily as needed for hemorrhoids or anal itching. 30 g 1   levothyroxine (SYNTHROID) 125 MCG tablet Take 0.5 tablets (62.5 mcg total) by mouth daily at 6 (six) AM. 60 tablet 1   magnesium oxide (MAG-OX) 400 (240 Mg) MG tablet Take 1 tablet (400 mg total) by mouth daily. 60 tablet 2   tiZANidine (ZANAFLEX) 4 MG tablet Take 1 tablet (4 mg total) by mouth every 8 (eight) hours as needed for muscle spasms. 60 tablet 1   gabapentin (NEURONTIN) 250 MG/5ML solution Take 62m at bedtime x 1 week, then increase to 442mat bedtime.  Call the office in 3 weeks to see if the dose needs to be increased. (Patient not taking: Reported on 05/16/2021) 470 mL 5   HYDROcodone-acetaminophen (NORCO/VICODIN) 5-325 MG tablet Take 1-2 tablets by mouth every 4 (four) hours as needed for moderate pain. (Patient not taking: Reported on 05/16/2021) 30 tablet 0   No current facility-administered medications for this visit.    Allergies:   Penicillin g and Penicillins   Social History: Social History   Socioeconomic History   Marital status: Single    Spouse name: Not on file   Number of children: 0   Years of education: 1.5 Collge   Highest education level: Not  on file  Occupational History   Occupation: Unemployed    Employer: OTHER  Tobacco Use   Smoking status: Former    Packs/day: 0.00    Years: 28.00    Pack years: 0.00    Types: Cigarettes    Quit date: 10/04/2016    Years since quitting: 4.6   Smokeless tobacco: Never   Tobacco comments:    smoking is intermittent - not ready to quit  Vaping Use   Vaping Use: Never used  Substance and Sexual Activity   Alcohol use: Not Currently    Alcohol/week: 0.0 standard drinks    Comment:  few times a year   Drug use: Not Currently    Types: Marijuana    Comment:  occ    Sexual activity: Yes    Birth control/protection: None, Condom  Other Topics Concern   Not on file  Social History Narrative   Patient lives at home with her aunt.   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  She moved to [facility] in August 2015.   She has been on disability since 2010.   Education: 1 1/2 years of college.   Caffeine - coke 2 cans/day   Exercise - no   Social Determinants of Radio broadcast assistant Strain: Not on file  Food Insecurity: Not on file  Transportation Needs: Not on file  Physical Activity: Not on file  Stress: Not on file  Social Connections: Not on file  Intimate Partner Violence: Not on file    Family History: Family History  Problem Relation Age of Onset   Cancer Father 22       Deceased   Heart disease Father    COPD Mother    Diabetes Maternal Uncle    Heart disease Maternal Uncle    Heart disease Paternal Grandmother    Diabetes Paternal Grandmother    Hypertension Paternal Grandmother    Neuromuscular disorder Maternal Aunt        Myotonic dystrophy   Neuromuscular disorder Cousin        Myotonic dystrophy   Neuromuscular disorder Sister        Myotonic dystrophy   Colon cancer Neg Hx    Colon polyps Neg Hx    Esophageal cancer Neg Hx    Rectal cancer Neg Hx    Stomach cancer Neg Hx      Review of Systems: All other systems reviewed and are otherwise negative except as noted above.  Physical Exam: Vitals:   05/16/21 0918  BP: (!) 74/55  Pulse: 96  SpO2: 100%  Weight: 89 lb (40.4 kg)  Height: '5\' 3"'$  (1.6 m)     GEN- The patient is thin well appearing, alert and oriented x 3 today.   HEENT: normocephalic, atraumatic; sclera clear, conjunctiva pink; hearing intact; oropharynx clear; neck supple  Lungs- Clear to ausculation bilaterally, normal work of breathing.  No wheezes, rales, rhonchi Heart- Regular rate and rhythm, no murmurs, rubs or gallops  GI- soft, non-tender, non-distended, bowel  sounds present  Extremities- no clubbing or cyanosis. No edema MS- no significant deformity or atrophy Skin- warm and dry, no rash or lesion; PPM pocket well healed Psych- euthymic mood, full affect Neuro- strength and sensation are intact  PPM Interrogation- reviewed in detail today,  See PACEART report  EKG:  EKG is not ordered today.   Recent Labs: 07/12/2020: TSH 0.820 03/22/2021: Hemoglobin 12.4; Platelets 313 04/22/2021: BUN 5; Creatinine, Ser 0.42; Potassium 3.5;  Sodium 141   Wt Readings from Last 3 Encounters:  05/16/21 89 lb (40.4 kg)  04/21/21 88 lb (39.9 kg)  03/22/21 91 lb (41.3 kg)     Other studies Reviewed: Additional studies/ records that were reviewed today include: Previous EP office notes, Previous remote checks, Most recent labwork.   Assessment and Plan:  1. Second Degree AV block s/p [employer] PPM  Normal PPM function See Pace Art report No changes today  2. Myotonic dystrophy Stable  3. Hypotension BP in 80s today, though her arms are quite small and we did not have child cuff available. She will continue to monitor closely. She has strong pulses and denies lightheadedness or dizziness. No syncope or near syncope.  Encouraged hydration.   Current medicines are reviewed at length with the patient today.   The patient does not have concerns regarding her medicines.  The following changes were made today:  none  Disposition:   Follow up with Dr. Caryl Comes in 12 Months    Signed, Annamaria Helling  05/16/2021 9:43 AM  [place] [address] [place] [address] [phone] (office) [phone] (fax)

## 2021-05-16 ENCOUNTER — Ambulatory Visit (INDEPENDENT_AMBULATORY_CARE_PROVIDER_SITE_OTHER): Payer: Medicare Other | Admitting: Student

## 2021-05-16 ENCOUNTER — Encounter: Payer: Self-pay | Admitting: Student

## 2021-05-16 ENCOUNTER — Other Ambulatory Visit: Payer: Self-pay

## 2021-05-16 VITALS — BP 84/55 | HR 96 | Ht 63.0 in | Wt 89.0 lb

## 2021-05-16 DIAGNOSIS — G7111 Myotonic muscular dystrophy: Secondary | ICD-10-CM | POA: Diagnosis not present

## 2021-05-16 DIAGNOSIS — I441 Atrioventricular block, second degree: Secondary | ICD-10-CM | POA: Diagnosis not present

## 2021-05-16 DIAGNOSIS — I495 Sick sinus syndrome: Secondary | ICD-10-CM | POA: Diagnosis not present

## 2021-05-16 LAB — CUP PACEART REMOTE DEVICE CHECK
Battery Remaining Longevity: 92 mo
Battery Voltage: 2.98 V
Brady Statistic AP VP Percent: 25.22 %
Brady Statistic AP VS Percent: 23.73 %
Brady Statistic AS VP Percent: 45.91 %
Brady Statistic AS VS Percent: 5.14 %
Brady Statistic RA Percent Paced: 48.76 %
Brady Statistic RV Percent Paced: 71.18 %
Date Time Interrogation Session: 20220912021124
Implantable Lead Implant Date: 20180614
Implantable Lead Implant Date: 20180614
Implantable Lead Location: 753859
Implantable Lead Location: 753859
Implantable Lead Model: 3830
Implantable Lead Model: 5076
Implantable Pulse Generator Implant Date: 20180614
Lead Channel Impedance Value: 285 Ohm
Lead Channel Impedance Value: 342 Ohm
Lead Channel Impedance Value: 418 Ohm
Lead Channel Impedance Value: 513 Ohm
Lead Channel Pacing Threshold Amplitude: 0.75 V
Lead Channel Pacing Threshold Amplitude: 0.875 V
Lead Channel Pacing Threshold Pulse Width: 0.4 ms
Lead Channel Pacing Threshold Pulse Width: 0.4 ms
Lead Channel Sensing Intrinsic Amplitude: 3.5 mV
Lead Channel Sensing Intrinsic Amplitude: 3.5 mV
Lead Channel Sensing Intrinsic Amplitude: 3.875 mV
Lead Channel Sensing Intrinsic Amplitude: 3.875 mV
Lead Channel Setting Pacing Amplitude: 2 V
Lead Channel Setting Pacing Amplitude: 2 V
Lead Channel Setting Pacing Pulse Width: 1 ms
Lead Channel Setting Sensing Sensitivity: 2 mV

## 2021-05-16 LAB — CUP PACEART INCLINIC DEVICE CHECK
Battery Remaining Longevity: 96 mo
Battery Voltage: 2.98 V
Brady Statistic AP VP Percent: 25.41 %
Brady Statistic AP VS Percent: 23.86 %
Brady Statistic AS VP Percent: 45.67 %
Brady Statistic AS VS Percent: 5.07 %
Brady Statistic RA Percent Paced: 49.07 %
Brady Statistic RV Percent Paced: 71.12 %
Date Time Interrogation Session: 20220912100023
Implantable Lead Implant Date: 20180614
Implantable Lead Implant Date: 20180614
Implantable Lead Location: 753859
Implantable Lead Location: 753859
Implantable Lead Model: 3830
Implantable Lead Model: 5076
Implantable Pulse Generator Implant Date: 20180614
Lead Channel Impedance Value: 342 Ohm
Lead Channel Impedance Value: 399 Ohm
Lead Channel Impedance Value: 475 Ohm
Lead Channel Impedance Value: 589 Ohm
Lead Channel Pacing Threshold Amplitude: 0.75 V
Lead Channel Pacing Threshold Amplitude: 0.875 V
Lead Channel Pacing Threshold Pulse Width: 0.4 ms
Lead Channel Pacing Threshold Pulse Width: 0.4 ms
Lead Channel Sensing Intrinsic Amplitude: 3.25 mV
Lead Channel Sensing Intrinsic Amplitude: 4.125 mV
Lead Channel Sensing Intrinsic Amplitude: 4.5 mV
Lead Channel Sensing Intrinsic Amplitude: 5.5 mV
Lead Channel Setting Pacing Amplitude: 2 V
Lead Channel Setting Pacing Amplitude: 2 V
Lead Channel Setting Pacing Pulse Width: 1 ms
Lead Channel Setting Sensing Sensitivity: 2 mV

## 2021-05-16 NOTE — Patient Instructions (Signed)
Medication Instructions:  Your physician recommends that you continue on your current medications as directed. Please refer to the Current Medication list given to you today.  *If you need a refill on your cardiac medications before your next appointment, please call your pharmacy*   Lab Work: None  If you have labs (blood work) drawn today and your tests are completely normal, you will receive your results only by: [place] (if you have MyChart) OR A paper copy in the mail If you have any lab test that is abnormal or we need to change your treatment, we will call you to review the results.   Follow-Up: At [facility], you and your health needs are our priority.  As part of our continuing mission to provide you with exceptional heart care, we have created designated Provider Care Teams.  These Care Teams include your primary Cardiologist (physician) and Advanced Practice Providers (APPs -  Physician Assistants and Nurse Practitioners) who all work together to provide you with the care you need, when you need it.  We recommend signing up for the patient portal called "MyChart".  Sign up information is provided on this After Visit Summary.  MyChart is used to connect with patients for Virtual Visits (Telemedicine).  Patients are able to view lab/test results, encounter notes, upcoming appointments, etc.  Non-urgent messages can be sent to your provider as well.   To learn more about what you can do with MyChart, go to [email or web address].    Your next appointment:   1 year(s)  The format for your next appointment:   In Person  Provider:   You may see Virl Axe, MD or one of the following Advanced Practice Providers on your designated Care Team:   Tommye Standard, [place] "[facility]" [place], [place]

## 2021-05-26 DIAGNOSIS — E890 Postprocedural hypothyroidism: Secondary | ICD-10-CM | POA: Diagnosis not present

## 2021-06-08 LAB — HM HEPATITIS C SCREENING LAB: HM Hepatitis Screen: NEGATIVE

## 2021-06-20 ENCOUNTER — Other Ambulatory Visit: Payer: Self-pay | Admitting: Family Medicine

## 2021-06-20 DIAGNOSIS — Z1231 Encounter for screening mammogram for malignant neoplasm of breast: Secondary | ICD-10-CM

## 2021-06-27 ENCOUNTER — Ambulatory Visit: Payer: Medicare Other | Admitting: Neurology

## 2021-07-06 ENCOUNTER — Encounter: Payer: Self-pay | Admitting: Neurology

## 2021-07-06 ENCOUNTER — Ambulatory Visit (INDEPENDENT_AMBULATORY_CARE_PROVIDER_SITE_OTHER): Payer: Medicare Other | Admitting: Neurology

## 2021-07-06 ENCOUNTER — Other Ambulatory Visit: Payer: Self-pay

## 2021-07-06 VITALS — BP 109/70 | HR 85 | Ht 63.0 in | Wt 94.0 lb

## 2021-07-06 DIAGNOSIS — M791 Myalgia, unspecified site: Secondary | ICD-10-CM

## 2021-07-06 DIAGNOSIS — G7111 Myotonic muscular dystrophy: Secondary | ICD-10-CM

## 2021-07-06 MED ORDER — GABAPENTIN 250 MG/5ML PO SOLN: ORAL | 5 refills | Status: DC

## 2021-07-06 NOTE — Progress Notes (Signed)
Follow-up Visit   Date: 07/06/21    Cynthia Underwood MRN: [id number] DOB: August 06, 1971   Interim History: Cynthia Underwood is a 50 y.o. 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  The patient was accompanied to the clinic by self.  History of present illness: Starting around the age of 56, she started having lock jaw and stiffness of the hands.  Over the years, she developed worsening stiffness of her fingers and hands.  Because her maternal aunt, who is also a patient of mine, had known myotonic dystrophy, she ultimately was genetically tested at the age of 57 which confirmed the diagnosis of myotonic dystrophy type 1. She has a strong family history of DM1 including maternal aunt, cousins x 2, and younger sister.  She does not have any children and has no future plans for pregnancy.  Symptoms were relatively stable until her mid-30s and then started developing fatigue, weakness, daytime sleepiness, and shortness of breath with exertion.  She walks independently but was told previously told to use leg braces.  Over the past few years, she noticed intermittent difficulty swallowing liquids. She underwent barium swallow which reportedly showed signs of aspiration and she was recommended to use a straw and use chin tuck position.    She was seeing several neurologists over the years at [facility] in [place].  She is not working and has been on disability since 2010.  She established care with me in February 2016.   In fall of 2016, she had to move her bedroom to the [place] because of difficulty climbing stairs.  Since completing home PT/OT, her muscle strength has improved and sometimes, she is able to walk without AFO.  By 2017, she was able to climb stairs and went back to sleeping upstairs. She was started in  Cymbalta 30mg  for mood, fatigue and myalgias which significantly helped, but self-discontinued this in 2018 due to feeling it was ineffective.    She followed by cardiology for abnormal EKG with RBBB and LAFB as well as myocardial thinning.  In 2018, she had dual chamber PPM implanted by Dr. Caryl Comes.  She is also undergoing pulmonology work-up for dyspnea, which is suggestive of restrictive changes, consistent with muscular weakness due to DM1.    UPDATE 12/22/2019:  She is here for follow-up visit.  She is requesting a letter to be excused from jury duty.  Her headaches have resolved since restarting Remeron 7.5mg  and weight has increased by 5lb.  She reports having bilateral leg soreness since doing an intense exercise program over the weekend, but the soreness is improving.  No new weakness or changes in speech/swallow.  She continues to have good days and bad days.  She is no longer using her AFO because of discomfort in the feet.    UPDATE 07/06/2021:  She is here for follow-up visit.  Overall, there has been no significant change with her motor strength.  She has not suffered and falls and continues to walk without AFO.  Her swallowing has improved since having thyroidectomy for thyroid goiter.  She continues to have muscle soreness after activity such as exercise or dancing.  She was taking gabapentin but ran out of the prescription and would like to increase the dose.  She also complains of weight loss and insomnia.   Medications:  Current Outpatient Medications on File  Prior to Visit  Medication Sig Dispense Refill   albuterol (VENTOLIN HFA) 108 (90 Base) MCG/ACT inhaler INHALE 2 PUFFS INTO THE LUNGS EVERY 4 HOURS AS NEEDED FOR WHEEZING OR SHORTNESS OF BREATH 18 g 1   gabapentin (NEURONTIN) 250 MG/5ML solution Take 70mL at bedtime x 1 week, then increase to 40mL at bedtime.  Call the office in 3 weeks to see if the dose needs to be increased. (Patient taking differently: Take 31mL at bedtime x 1 week,  then increase to 62mL at bedtime.  Call the office in 3 weeks to see if the dose needs to be increased.) 470 mL 5   hydrocortisone (ANUSOL-HC) 2.5 % rectal cream Place 1 application rectally 2 (two) times daily as needed for hemorrhoids or anal itching. 30 g 1   levothyroxine (SYNTHROID) 125 MCG tablet Take 0.5 tablets (62.5 mcg total) by mouth daily at 6 (six) AM. 60 tablet 1   tiZANidine (ZANAFLEX) 4 MG tablet Take 1 tablet (4 mg total) by mouth every 8 (eight) hours as needed for muscle spasms. 60 tablet 1   No current facility-administered medications on file prior to visit.    Allergies:  Allergies  Allergen Reactions   Penicillin G Other (See Comments), Rash and Nausea And Vomiting    Sore throat   Penicillins Nausea And Vomiting, Rash and Other (See Comments)    Fever Has patient had a PCN reaction causing immediate rash, facial/tongue/throat swelling, SOB or lightheadedness with hypotension: Yes Has patient had a PCN reaction causing severe rash involving mucus membranes or skin necrosis: Unknown Has patient had a PCN reaction that required hospitalization: No Has patient had a PCN reaction occurring within the last 10 years: No If all of the above answers are "NO", then may proceed with Cephalosporin use.     Vital Signs:  BP 109/70   Pulse 85   Ht 5\' 3"  (1.6 m)   Wt 94 lb (42.6 kg)   LMP 12/07/2016   SpO2 97%   BMI 16.65 kg/m   Neurological Exam: MENTAL STATUS including orientation to time, place, person, recent and remote memory, attention span and concentration, language, and fund of knowledge is normal.  Speech is mildly dysarthric and hypophonic.    CRANIAL NERVES: Pupils equal round and reactive to light.. Normal conjugate, extra-ocular eye movements in all directions of gaze. Mild bilateral ptosis. Typical mytonic facies.   MOTOR:  Generalized loss of muscle bulk. Mild grip myotonia bilaterally.  Right Upper Extremity:    Left Upper Extremity:    Deltoid  5-/5    Deltoid  5-/5   Biceps  5-/5   Biceps  5-/5   Triceps  5-/5   Triceps  5-/5   Wrist extensors  5/5   Wrist extensors  5/5   Wrist flexors  5/5   Wrist flexors  5/5   Finger extensors  4/5   Finger extensors  4/5   Finger flexors  4/5   Finger flexors  4/5   Dorsal interossei  4/5   Dorsal interossei  4/5   Abductor pollicis  4/5   Abductor pollicis  4/5   Tone (Ashworth scale)  0  Tone (Ashworth scale)  0   Right Lower Extremity:    Left Lower Extremity:    Hip flexors  4+/5   Hip flexors  4+/5   Hip extensors  4/5   Hip extensors  4/5   Knee flexors  5/5   Knee flexors  5/5  Knee extensors  5/5   Knee extensors  5/5   Dorsiflexors  4/5   Dorsiflexors  4/5   Plantarflexors  5-/5   Plantarflexors  5-/5   Tone (Ashworth scale)  0  Tone (Ashworth scale)  0   SENSATION:  Intact to vibration throughout  COORDINATION/GAIT:   Gait is stable, mild dragging of the feet bilaterally.    Data: N/a  IMPRESSION/PLAN: Myotonic dystrophy type 1, genetically confirmed with strong family history.  Disease complicated by cataracts, cardiac arrhythmia s/p PPM, dyspnea, and dysphagia.  Clinically stable.   - For myalgias, start gabapentin 250mg  at bedtime x 1 week, then increase to 500mg  at bedtime  - Continue to monitor  Insomnia - Recommend she discuss management options with PCP, she can try melatonin 1-3mg   3h before bedtime in the meantime  Return to clinic in 9 months  Thank you for allowing me to participate in patient's care.  If I can answer any additional questions, I would be pleased to do so.    Sincerely,    Erin Uecker K. Posey Pronto, DO

## 2021-07-06 NOTE — Patient Instructions (Addendum)
You can try melatonin 1-3mg  2-3 hours before bedtime to see if it will help you sleep  We will restart gabapentin 42ml x 1 week, then increase to 10mg  at bedtime.  Return to clinic in 9 months

## 2021-07-18 ENCOUNTER — Other Ambulatory Visit: Payer: Self-pay

## 2021-07-20 ENCOUNTER — Ambulatory Visit
Admission: RE | Admit: 2021-07-20 | Discharge: 2021-07-20 | Disposition: A | Discharge status: Home / Self Care | Payer: Medicare Other | Source: Ambulatory Visit | Attending: Family Medicine | Admitting: Family Medicine

## 2021-07-20 ENCOUNTER — Other Ambulatory Visit: Payer: Self-pay

## 2021-07-20 DIAGNOSIS — Z1231 Encounter for screening mammogram for malignant neoplasm of breast: Secondary | ICD-10-CM

## 2021-07-25 ENCOUNTER — Encounter: Payer: Self-pay | Admitting: Family Medicine

## 2021-07-25 ENCOUNTER — Ambulatory Visit: Payer: Medicare Other | Attending: Family Medicine | Admitting: Family Medicine

## 2021-07-25 ENCOUNTER — Other Ambulatory Visit: Payer: Self-pay

## 2021-07-25 VITALS — BP 111/75 | HR 104 | Ht 63.0 in | Wt 93.4 lb

## 2021-07-25 DIAGNOSIS — R634 Abnormal weight loss: Secondary | ICD-10-CM

## 2021-07-25 DIAGNOSIS — G4709 Other insomnia: Secondary | ICD-10-CM | POA: Diagnosis not present

## 2021-07-25 DIAGNOSIS — Z0000 Encounter for general adult medical examination without abnormal findings: Secondary | ICD-10-CM

## 2021-07-25 DIAGNOSIS — Z23 Encounter for immunization: Secondary | ICD-10-CM

## 2021-07-25 MED ORDER — MIRTAZAPINE 15 MG PO TBDP: 15.0000 mg | ORAL_TABLET | Freq: Every day | ORAL | 6 refills | Status: DC

## 2021-07-25 NOTE — Patient Instructions (Signed)
  Ms. Bergquist , Thank you for taking time to come for your Medicare Wellness Visit. I appreciate your ongoing commitment to your health goals. Please review the following plan we discussed and let me know if I can assist you in the future.   These are the goals we discussed:  Goals   None     This is a list of the screening recommended for you and due dates:  Health Maintenance  Topic Date Due   COVID-19 Vaccine (1) Never done   Zoster (Shingles) Vaccine (1 of 2) Never done   Pneumococcal Vaccination (2 - PCV) 08/03/2020   Flu Shot  04/04/2021   Pap Smear  09/14/2022   Colon Cancer Screening  10/06/2022   Mammogram  07/21/2023   Tetanus Vaccine  01/03/2028   Hepatitis C Screening: USPSTF Recommendation to screen - Ages 18-79 yo.  Completed   HIV Screening  Completed   HPV Vaccine  Aged Out

## 2021-07-25 NOTE — Progress Notes (Signed)
Subjective:   Cynthia Underwood is a 50 y.o. female who presents for Medicare Annual (Subsequent) preventive examination. Medical history significant formyotonic dystrophy complicated by cardiac conduction abnormalities including second-degree heart s/p pacemaker placement.  She is concerned about being unable to sleep because she gets a few hours of sleep.  She is also concerned about inability to gain weight as she has a reduced appetite.  Requests something that would help her improve appetite and gained some weight as well as give her good sleep.  Review of Systems    General: negative for fever, + weight loss, reduced appetite change Eyes: no visual symptoms. ENT: no ear symptoms, no sinus tenderness, no nasal congestion or sore throat. Neck: no pain  Respiratory: no wheezing, shortness of breath, cough Cardiovascular: no chest pain, no dyspnea on exertion, no pedal edema, no orthopnea. Gastrointestinal: no abdominal pain, no diarrhea, no constipation Genito-Urinary: no urinary frequency, no dysuria, no polyuria. Hematologic: no bruising Endocrine: no cold or heat intolerance Neurological: no headaches, no seizures, no tremors Musculoskeletal: no joint pains, no joint swelling Skin: no pruritus, no rash. Psychological: no depression, no anxiety,         Objective:    Today's Vitals   07/25/21 1511  BP: 111/75  Pulse: (!) 104  SpO2: 98%  Weight: 93 lb 6.4 oz (42.4 kg)  Height: 5\' 3"  (1.6 m)   Body mass index is 16.55 kg/m.  Advanced Directives 07/25/2021 07/06/2021 04/21/2021 03/22/2021 12/22/2019 06/23/2019 08/27/2018  Does Patient Have a Medical Advance Directive? No No No No No No No  Would patient like information on creating a medical advance directive? - - No - Patient declined No - Patient declined - - -    Current Medications (verified) Outpatient Encounter Medications as of 07/25/2021  Medication Sig   albuterol (VENTOLIN HFA) 108 (90 Base) MCG/ACT inhaler  INHALE 2 PUFFS INTO THE LUNGS EVERY 4 HOURS AS NEEDED FOR WHEEZING OR SHORTNESS OF BREATH   gabapentin (NEURONTIN) 250 MG/5ML solution Take 2mL at bedtime x 1 week, then increase to 57mL at bedtime.   hydrocortisone (ANUSOL-HC) 2.5 % rectal cream Place 1 application rectally 2 (two) times daily as needed for hemorrhoids or anal itching.   levothyroxine (SYNTHROID) 125 MCG tablet Take 0.5 tablets (62.5 mcg total) by mouth daily at 6 (six) AM.   mirtazapine (REMERON SOL-TAB) 15 MG disintegrating tablet Take 1 tablet (15 mg total) by mouth at bedtime.   tiZANidine (ZANAFLEX) 4 MG tablet Take 1 tablet (4 mg total) by mouth every 8 (eight) hours as needed for muscle spasms.   No facility-administered encounter medications on file as of 07/25/2021.    Allergies (verified) Penicillin g and Penicillins   History: Past Medical History:  Diagnosis Date   Bifascicular block 10/02/2014   Dyspnea    Excess ear wax    Multinodular goiter    Pneumonia    Presence of permanent cardiac pacemaker    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   Sickle cell trait ([place])    Watery eyes    Left   Weakness of both legs    Past Surgical History:  Procedure Laterality Date   BREAST BIOPSY Right    EYE SURGERY Left 2017   "related to blockage in my nose"   EYE SURGERY Right 05/2019   INSERT / REPLACE / REMOVE PACEMAKER  02/15/2017   PACEMAKER IMPLANT N/A 02/15/2017   Procedure: Pacemaker Implant;  Surgeon: Virl Axe  C, MD;  Location: [facility];  Service: Cardiovascular;  Laterality: N/A;   THYROIDECTOMY N/A 04/21/2021   Procedure: TOTAL THYROIDECTOMY;  Surgeon: Ralene Ok, MD;  Location: [facility];  Service: General;  Laterality: N/A;   Family History  Problem Relation Age of Onset   Cancer Father 31       Deceased   Heart disease Father    COPD Mother    Diabetes Maternal Uncle    Heart disease Maternal Uncle    Heart disease Paternal  Grandmother    Diabetes Paternal Grandmother    Hypertension Paternal Grandmother    Neuromuscular disorder Maternal Aunt        Myotonic dystrophy   Neuromuscular disorder Cousin        Myotonic dystrophy   Neuromuscular disorder Sister        Myotonic dystrophy   Colon cancer Neg Hx    Colon polyps Neg Hx    Esophageal cancer Neg Hx    Rectal cancer Neg Hx    Stomach cancer Neg Hx    Social History   Socioeconomic History   Marital status: Single    Spouse name: Not on file   Number of children: 0   Years of education: 1.5 Collge   Highest education level: Not on file  Occupational History   Occupation: Unemployed    Employer: OTHER  Tobacco Use   Smoking status: Former    Packs/day: 0.00    Years: 28.00    Pack years: 0.00    Types: Cigarettes    Quit date: 10/04/2016    Years since quitting: 4.8   Smokeless tobacco: Never   Tobacco comments:    smoking is intermittent - not ready to quit  Vaping Use   Vaping Use: Never used  Substance and Sexual Activity   Alcohol use: Not Currently    Alcohol/week: 0.0 standard drinks    Comment:  few times a year   Drug use: Not Currently    Types: Marijuana    Comment: occ    Sexual activity: Yes    Birth control/protection: None, Condom  Other Topics Concern   Not on file  Social History Narrative   Patient lives at home with her aunt.   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  She moved to [facility] in August 2015.   She has been on disability since 2010.   Education: 1 1/2 years of college.   Caffeine - coke 2 cans/day   Exercise - no   Social Determinants of Radio broadcast assistant Strain: Not on file  Food Insecurity: Not on file  Transportation Needs: Not on file  Physical Activity: Not on file  Stress: Not on file  Social Connections: Not on file    Tobacco Counseling Counseling given: Not Answered Tobacco comments: smoking is intermittent - not ready to quit   Clinical  Intake:  Pre-visit preparation completed: Yes  Pain : No/denies pain     Diabetes: No     Diabetic?No  Interpreter Needed?: No      Activities of Daily Living In your present state of health, do you have any difficulty performing the following activities: 07/25/2021 04/21/2021  Hearing? N -  Vision? N -  Difficulty concentrating or making decisions? N -  Walking or climbing stairs? N -  Dressing or bathing? N -  Doing errands, shopping? N N  Preparing Food and eating ? N -  Using the Toilet?  N -  In the past six months, have you accidently leaked urine? N -  Do you have problems with loss of bowel control? N -  Managing your Medications? N -  Managing your Finances? N -  Housekeeping or managing your Housekeeping? N -  Some recent data might be hidden    Patient Care Team: Charlott Rakes, MD as PCP - General (Family Medicine) Sueanne Margarita, MD as PCP - Cardiology (Cardiology) Deboraha Sprang, MD as PCP - Electrophysiology (Cardiology) Alda Berthold, DO as Consulting Physician (Neurology)  Indicate any recent Medical Services you may have received from other than [facility] providers in the past year (date may be approximate).     Constitutional: normal appearing,  Eyes: PERRLA HEENT: Head is atraumatic, normal sinuses, normal oropharynx, normal appearing tonsils and palate, tympanic membrane is normal bilaterally. Neck: normal range of motion, no thyromegaly, no JVD Cardiovascular: tachycardic rate, normal rhythm, normal heart sounds, no murmurs, rub or gallop, no pedal edema Respiratory: Normal breath sounds, clear to auscultation bilaterally, no wheezes, no rales, no rhonchi Abdomen: soft, not tender to palpation, normal bowel sounds, no enlarged organs Musculoskeletal: Full ROM, no tenderness in joints Skin: warm and dry, no lesions. Neurological: alert, oriented x3, cranial nerves I-XII grossly intact , normal motor strength, normal sensation. Psychological:  normal mood.  Assessment:   This is a routine wellness examination for [place].  Hearing/Vision screen Vision Screening   Right eye Left eye Both eyes  Without correction 20/30 20/50   With correction     Comments: DID NOT HAVE GLASSES WITH HER   Dietary issues and exercise activities discussed:     Goals Addressed   None    Depression Screen PHQ 2/9 Scores 07/25/2021 07/25/2021 07/12/2020 09/15/2019 11/07/2018 09/11/2018 09/03/2018  PHQ - 2 Score 0 2 0 0 0 2 2  PHQ- 9 Score - 9 10 7 8 10 11     Fall Risk Fall Risk  07/25/2021 07/25/2021 07/06/2021 07/19/2020 07/12/2020  Falls in the past year? 0 0 0 0 0  Number falls in past yr: 0 - 0 0 0  Injury with Fall? 0 - 0 - 0  Risk Factor Category  - - - - -  Risk for fall due to : - - - - No Fall Risks  Follow up - - - - -    [place]:  Any stairs in or around the home? No  If so, are there any without handrails? No  Home free of loose throw rugs in walkways, pet beds, electrical cords, etc? Yes Adequate lighting in your home to reduce risk of falls? Yes   ASSISTIVE DEVICES UTILIZED TO PREVENT FALLS:  Life alert? No  Use of a cane, walker or w/c? No  Grab bars in the bathroom? Yes  Shower chair or bench in shower? Yes  Elevated toilet seat or a handicapped toilet? No   TIMED UP AND GO:  Was the test performed? Yes .  Length of time to ambulate 10 feet: 6 sec.   Gait steady and fast without use of assistive device  Cognitive Function: MMSE - Mini Mental State Exam 07/25/2021  Orientation to time 5  Orientation to Place 5  Registration 3  Attention/ Calculation 5  Recall 3  Language- name 2 objects 2  Language- repeat 1  Language- follow 3 step command 3  Language- read & follow direction 1  Write a sentence 1  Copy design  1  Total score 30        Immunizations Immunization History  Administered Date(s) Administered   Influenza,inj,Quad PF,6+ Mos 08/11/2014, 07/01/2015,  08/04/2019, 07/12/2020, 07/25/2021   PNEUMOCOCCAL CONJUGATE-20 07/25/2021   Pneumococcal Polysaccharide-23 08/04/2019   Tdap 01/02/2018     Screening Tests Health Maintenance  Topic Date Due   COVID-19 Vaccine (1) Never done   Zoster Vaccines- Shingrix (1 of 2) Never done   PAP SMEAR-Modifier  09/14/2022   COLONOSCOPY (Pts 45-28yrs Insurance coverage will need to be confirmed)  10/06/2022   MAMMOGRAM  07/21/2023   TETANUS/TDAP  01/03/2028   Pneumococcal Vaccine 66-39 Years old  Completed   INFLUENZA VACCINE  Completed   Hepatitis C Screening  Completed   HIV Screening  Completed   HPV VACCINES  Aged Out    Health Maintenance  Health Maintenance Due  Topic Date Due   COVID-19 Vaccine (1) Never done   Zoster Vaccines- Shingrix (1 of 2) Never done    Colorectal cancer screening: Type of screening: Colonoscopy. Completed 10/07/2019. Repeat every 3 years  Mammogram status: Completed 07/2021. Repeat every year  Additional Screening:  Hepatitis C Screening: does qualify; Completed 06/08/21  Vision Screening: Rec Community Resource Referral / Chronic Care Management: CRR required this visit?  No   CCM required this visit?  No      Plan:   1. Encounter for Medicare annual wellness exam Counseled on 150 minutes of exercise per week, healthy eating (including decreased daily intake of saturated fats, cholesterol, added sugars, sodium), routine healthcare maintenance.   2. Other insomnia - mirtazapine (REMERON SOL-TAB) 15 MG disintegrating tablet; Take 1 tablet (15 mg total) by mouth at bedtime.  Dispense: 30 tablet; Refill: 6  3. Weight loss Offered to refer to nutritionist but she declines - mirtazapine (REMERON SOL-TAB) 15 MG disintegrating tablet; Take 1 tablet (15 mg total) by mouth at bedtime.  Dispense: 30 tablet; Refill: 6  4. Need for pneumococcal vaccine - Pneumococcal conjugate vaccine 20-valent  5. Need for immunization against influenza - Flu Vaccine QUAD  15mo+IM (Fluarix, Fluzone & Alfiuria Quad PF)   I have personally reviewed and noted the following in the patient's chart:   Medical and social history Use of alcohol, tobacco or illicit drugs  Current medications and supplements including opioid prescriptions.  Functional ability and status Nutritional status Physical activity Advanced directives List of other physicians Hospitalizations, surgeries, and ER visits in previous 12 months Vitals Screenings to include cognitive, depression, and falls Referrals and appointments  In addition, I have reviewed and discussed with patient certain preventive protocols, quality metrics, and best practice recommendations. A written personalized care plan for preventive services as well as general preventive health recommendations were provided to patient.     Charlott Rakes, MD   07/26/2021

## 2021-07-25 NOTE — Progress Notes (Signed)
Concern about weight.

## 2021-08-15 ENCOUNTER — Ambulatory Visit (INDEPENDENT_AMBULATORY_CARE_PROVIDER_SITE_OTHER): Payer: Medicare Other

## 2021-08-15 DIAGNOSIS — I441 Atrioventricular block, second degree: Secondary | ICD-10-CM

## 2021-08-15 LAB — CUP PACEART REMOTE DEVICE CHECK
Battery Remaining Longevity: 88 mo
Battery Voltage: 2.98 V
Brady Statistic AP VP Percent: 50.98 %
Brady Statistic AP VS Percent: 1.78 %
Brady Statistic AS VP Percent: 47.17 %
Brady Statistic AS VS Percent: 0.06 %
Brady Statistic RA Percent Paced: 52.76 %
Brady Statistic RV Percent Paced: 98.15 %
Date Time Interrogation Session: 20221212011200
Implantable Lead Implant Date: 20180614
Implantable Lead Implant Date: 20180614
Implantable Lead Location: 753859
Implantable Lead Location: 753859
Implantable Lead Model: 3830
Implantable Lead Model: 5076
Implantable Pulse Generator Implant Date: 20180614
Lead Channel Impedance Value: 266 Ohm
Lead Channel Impedance Value: 342 Ohm
Lead Channel Impedance Value: 399 Ohm
Lead Channel Impedance Value: 532 Ohm
Lead Channel Pacing Threshold Amplitude: 0.625 V
Lead Channel Pacing Threshold Amplitude: 1 V
Lead Channel Pacing Threshold Pulse Width: 0.4 ms
Lead Channel Pacing Threshold Pulse Width: 0.4 ms
Lead Channel Sensing Intrinsic Amplitude: 3.375 mV
Lead Channel Sensing Intrinsic Amplitude: 3.375 mV
Lead Channel Sensing Intrinsic Amplitude: 5.375 mV
Lead Channel Sensing Intrinsic Amplitude: 5.375 mV
Lead Channel Setting Pacing Amplitude: 2 V
Lead Channel Setting Pacing Amplitude: 2 V
Lead Channel Setting Pacing Pulse Width: 1 ms
Lead Channel Setting Sensing Sensitivity: 2 mV

## 2021-08-23 ENCOUNTER — Other Ambulatory Visit: Payer: Self-pay | Admitting: Family Medicine

## 2021-08-23 DIAGNOSIS — R634 Abnormal weight loss: Secondary | ICD-10-CM

## 2021-08-23 DIAGNOSIS — G4709 Other insomnia: Secondary | ICD-10-CM

## 2021-08-23 NOTE — Telephone Encounter (Signed)
Requested medications are due for refill today.  unsure  Requested medications are on the active medications list.  yes  Last refill. 07/25/2021  Future visit scheduled.   no  Notes to clinic.  Labs are expired.    Requested Prescriptions  Pending Prescriptions Disp Refills   mirtazapine (REMERON SOL-TAB) 15 MG disintegrating tablet [Pharmacy Med Name: MIRTAZAPINE 15 MG ODT] 90 tablet 3    Sig: TAKE 1 TABLET BY MOUTH EVERYDAY AT BEDTIME     Psychiatry: Antidepressants - mirtazapine Failed - 08/23/2021 12:30 PM      Failed - AST in normal range and within 360 days    AST  Date Value Ref Range Status  09/11/2018 19 0 - 40 IU/L Final  02/14/2018 26 5 - 34 U/L Final          Failed - ALT in normal range and within 360 days    ALT  Date Value Ref Range Status  09/11/2018 8 0 - 32 IU/L Final  02/14/2018 15 0 - 55 U/L Final          Failed - Triglycerides in normal range and within 360 days    Triglycerides  Date Value Ref Range Status  01/10/2018 113 0 - 149 mg/dL Final          Failed - Total Cholesterol in normal range and within 360 days    Cholesterol, Total  Date Value Ref Range Status  01/10/2018 214 (H) 100 - 199 mg/dL Final          Passed - WBC in normal range and within 360 days    WBC  Date Value Ref Range Status  03/22/2021 7.7 4.0 - 10.5 K/uL Final          Passed - Completed PHQ-2 or PHQ-9 in the last 360 days      Passed - Valid encounter within last 6 months    Recent Outpatient Visits           4 weeks ago Encounter for [employer] annual wellness exam   [place], Charlane Ferretti, MD   10 months ago Goiter diffuse   [place], Ileene [place], MD   1 year ago Myotonic dystrophy, type 1 [facility])   [place], [place], MD   1 year ago Enlarged thyroid   [place], [place], NP   1 year ago Encounter for  [employer] annual wellness exam   [facility] Charlott Rakes, MD

## 2021-08-25 NOTE — Progress Notes (Signed)
Remote pacemaker transmission.   

## 2021-11-01 ENCOUNTER — Telehealth (INDEPENDENT_AMBULATORY_CARE_PROVIDER_SITE_OTHER): Payer: Self-pay | Admitting: Family Medicine

## 2021-11-01 NOTE — Telephone Encounter (Signed)
Pt has been called and given an appointment to discuss thyroid.

## 2021-11-01 NOTE — Telephone Encounter (Signed)
Copied from [place] [id number]. Topic: General - Other >> Oct 31, 2021  4:35 PM Cynthia Underwood wrote: Reason for CRM: The patient had Underwood procedure related to their thyroid concerns in November 2022  The patient has been directed to contact their PCP to discuss medication management related to their thyroid   Please contact further when possible

## 2021-11-01 NOTE — Telephone Encounter (Signed)
Left message on voicemail to return call.  Attempt to schedule appt with Dr. Margarita Rana.  Had surgery 04/2021 with [facility].

## 2021-11-07 ENCOUNTER — Ambulatory Visit: Payer: Medicare Other | Attending: Family Medicine | Admitting: Family Medicine

## 2021-11-07 ENCOUNTER — Encounter: Payer: Self-pay | Admitting: Family Medicine

## 2021-11-07 ENCOUNTER — Other Ambulatory Visit: Payer: Self-pay

## 2021-11-07 VITALS — BP 99/66 | HR 78 | Ht 63.0 in | Wt 103.6 lb

## 2021-11-07 DIAGNOSIS — G7111 Myotonic muscular dystrophy: Secondary | ICD-10-CM | POA: Diagnosis not present

## 2021-11-07 DIAGNOSIS — E890 Postprocedural hypothyroidism: Secondary | ICD-10-CM

## 2021-11-07 DIAGNOSIS — I441 Atrioventricular block, second degree: Secondary | ICD-10-CM

## 2021-11-07 DIAGNOSIS — E039 Hypothyroidism, unspecified: Secondary | ICD-10-CM | POA: Insufficient documentation

## 2021-11-07 NOTE — Progress Notes (Signed)
Needs dental clearance. ?

## 2021-11-07 NOTE — Progress Notes (Signed)
Subjective:  Patient ID: Cynthia Underwood, female    DOB: 23-Oct-1970  Age: 51 y.o. MRN: [id number]  CC: Thyroid Problem   HPI Cynthia Underwood is a 51 y.o. 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  Interval History: Doing well on levothyroxine and denies presence of tremors, pedal edema, GI symptoms.  She does have chronic fatigue in keeping with her myotonic dystrophy and has noticed some voice hoarseness.  Endorses compliance with her current dose of levothyroxine.  Last visit with neurology was in 07/2021 and she was placed on gabapentin at that time with next appointment in 1 year.  Today is a good day with regards to her myotonic dystrophy the patient. Last seen by cardiology in 05/2021 She will be undergoing General Anesthesia to extract her tooth and is requesting clearance for her procedure.  I have advised her that she would need to obtain clearance from cardiology given history of second-degree heart block status post pacemaker.   Past Medical History:  Diagnosis Date   Bifascicular block 10/02/2014   Dyspnea    Excess ear wax    Multinodular goiter    Pneumonia    Presence of permanent cardiac pacemaker    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   Sickle cell trait ([place])    Watery eyes    Left   Weakness of both legs     Past Surgical History:  Procedure Laterality Date   BREAST BIOPSY Right    EYE SURGERY Left 2017   "related to blockage in my nose"   EYE SURGERY Right 05/2019   INSERT / REPLACE / REMOVE PACEMAKER  02/15/2017   PACEMAKER IMPLANT N/A 02/15/2017   Procedure: Pacemaker Implant;  Surgeon: Deboraha Sprang, MD;  Location: [facility];  Service: Cardiovascular;  Laterality: N/A;    THYROIDECTOMY N/A 04/21/2021   Procedure: TOTAL THYROIDECTOMY;  Surgeon: Ralene Ok, MD;  Location: [facility] OR;  Service: General;  Laterality: N/A;    Family History  Problem Relation Age of Onset   Cancer Father 82       Deceased   Heart disease Father    COPD Mother    Diabetes Maternal Uncle    Heart disease Maternal Uncle    Heart disease Paternal Grandmother    Diabetes Paternal Grandmother    Hypertension Paternal Grandmother    Neuromuscular disorder Maternal Aunt        Myotonic dystrophy   Neuromuscular disorder Cousin        Myotonic dystrophy   Neuromuscular disorder Sister        Myotonic dystrophy   Colon cancer Neg Hx    Colon polyps Neg Hx    Esophageal cancer Neg Hx    Rectal cancer Neg Hx    Stomach cancer Neg Hx     Allergies  Allergen Reactions   Penicillin G Other (See Comments), Rash and Nausea And Vomiting    Sore throat   Penicillins Nausea And Vomiting, Rash and Other (See Comments)    Fever Has patient had a PCN reaction causing immediate rash, facial/tongue/throat swelling, SOB or lightheadedness with hypotension: Yes Has patient had a PCN reaction causing severe rash involving mucus membranes or skin necrosis: Unknown Has patient had a PCN reaction that required hospitalization: No Has patient had  a PCN reaction occurring within the last 10 years: No If all of the above answers are "NO", then may proceed with Cephalosporin use.     Outpatient Medications Prior to Visit  Medication Sig Dispense Refill   albuterol (VENTOLIN HFA) 108 (90 Base) MCG/ACT inhaler INHALE 2 PUFFS INTO THE LUNGS EVERY 4 HOURS AS NEEDED FOR WHEEZING OR SHORTNESS OF BREATH 18 g 1   gabapentin (NEURONTIN) 250 MG/5ML solution Take 50m at bedtime x 1 week, then increase to 136mat bedtime. 470 mL 5   hydrocortisone (ANUSOL-HC) 2.5 % rectal cream Place 1 application rectally 2 (two) times daily as needed for hemorrhoids or anal itching. 30 g 1   levothyroxine (SYNTHROID) 125  MCG tablet Take 0.5 tablets (62.5 mcg total) by mouth daily at 6 (six) AM. 60 tablet 1   mirtazapine (REMERON SOL-TAB) 15 MG disintegrating tablet TAKE 1 TABLET BY MOUTH EVERYDAY AT BEDTIME 90 tablet 1   tiZANidine (ZANAFLEX) 4 MG tablet Take 1 tablet (4 mg total) by mouth every 8 (eight) hours as needed for muscle spasms. 60 tablet 1   No facility-administered medications prior to visit.     ROS Review of Systems  Constitutional:  Positive for fatigue. Negative for activity change, appetite change and fever.  HENT:  Positive for dental problem. Negative for congestion, sinus pressure and sore throat.   Eyes:  Negative for visual disturbance.  Respiratory:  Negative for cough, chest tightness, shortness of breath and wheezing.   Cardiovascular:  Negative for chest pain and palpitations.  Gastrointestinal:  Negative for abdominal distention, abdominal pain and constipation.  Endocrine: Negative for polydipsia.  Genitourinary:  Negative for dysuria and frequency.  Musculoskeletal:  Negative for arthralgias and back pain.  Skin:  Negative for rash.  Neurological:  Positive for weakness. Negative for tremors, light-headedness and numbness.  Hematological:  Does not bruise/bleed easily.  Psychiatric/Behavioral:  Negative for agitation and behavioral problems.    Objective:  BP 99/66    Pulse 78    Ht '5\' 3"'$  (1.6 m)    Wt 103 lb 9.6 oz (47 kg)    LMP 12/07/2016    SpO2 96%    BMI 18.35 kg/m   BP/Weight 11/07/2021 07/25/2021 1128/4/1324Systolic BP 99 114010027Diastolic BP 66 75 70  Wt. (Lbs) 103.6 93.4 94  BMI 18.35 16.55 16.65      Physical Exam Constitutional:      Appearance: She is well-developed.     Comments: Underweight  Cardiovascular:     Rate and Rhythm: Normal rate.     Heart sounds: Normal heart sounds. No murmur heard. Pulmonary:     Effort: Pulmonary effort is normal.     Breath sounds: Normal breath sounds. No wheezing or rales.  Chest:     Chest wall: No  tenderness.  Abdominal:     General: Bowel sounds are normal. There is no distension.     Palpations: Abdomen is soft. There is no mass.     Tenderness: There is no abdominal tenderness.  Musculoskeletal:        General: Normal range of motion.     Right lower leg: No edema.     Left lower leg: No edema.  Neurological:     Mental Status: She is alert and oriented to person, place, and time.  Psychiatric:        Mood and Affect: Mood normal.    CMP Latest Ref Rng & Units 04/22/2021 03/22/2021 09/11/2018  Glucose 70 - 99 mg/dL 137(H) 105(H) 85  BUN 6 - 20 mg/dL 5(L) 17 15  Creatinine 0.44 - 1.00 mg/dL 0.42(L) 0.64 0.86  Sodium 135 - 145 mmol/L 141 143 144  Potassium 3.5 - 5.1 mmol/L 3.5 4.1 4.0  Chloride 98 - 111 mmol/L 106 108 103  CO2 22 - 32 mmol/L '28 27 22  '$ Calcium 8.9 - 10.3 mg/dL 8.7(L) 9.8 9.9  Total Protein 6.0 - 8.5 g/dL - - 7.5  Total Bilirubin 0.0 - 1.2 mg/dL - - 0.6  Alkaline Phos 39 - 117 IU/L - - 65  AST 0 - 40 IU/L - - 19  ALT 0 - 32 IU/L - - 8    Lipid Panel     Component Value Date/Time   CHOL 214 (H) 01/10/2018 0847   TRIG 113 01/10/2018 0847   HDL 57 01/10/2018 0847   CHOLHDL 3.8 01/10/2018 0847   CHOLHDL 3.5 08/26/2014 1516   VLDL 20 08/26/2014 1516   LDLCALC 134 (H) 01/10/2018 0847    CBC    Component Value Date/Time   WBC 7.7 03/22/2021 1140   RBC 4.17 03/22/2021 1140   HGB 12.4 03/22/2021 1140   HGB 12.6 09/11/2018 1132   HCT 40.7 03/22/2021 1140   HCT 38.2 09/11/2018 1132   PLT 313 03/22/2021 1140   PLT 288 09/11/2018 1132   MCV 97.6 03/22/2021 1140   MCV 88 09/11/2018 1132   MCH 29.7 03/22/2021 1140   MCHC 30.5 03/22/2021 1140   RDW 17.8 (H) 03/22/2021 1140   RDW 14.7 09/11/2018 1132   LYMPHSABS 2.3 09/11/2018 1132   MONOABS 0.6 08/27/2018 2308   EOSABS 0.1 09/11/2018 1132   BASOSABS 0.0 09/11/2018 1132    Lab Results  Component Value Date   HGBA1C 5.7 01/09/2017    Lab Results  Component Value Date   TSH 0.820 07/12/2020    Assessment & Plan:  1. Postoperative hypothyroidism Currently on 62.5 mcg of levothyroxine We will check thyroid panel and adjust regimen accordingly - T4, free - TSH  2. Myotonic dystrophy, type 1 (HCC) Stable Continue gabapentin Follow-up with neurology  3. Mobitz type 2 second degree heart block Status post pacemaker Advised she will need to obtain cardiac clearance from cardiology given she will be undergoing general anesthesia   Health Care Maintenance: Declines Shingrix No orders of the defined types were placed in this encounter.   Follow-up: Return in about 6 months (around 05/10/2022) for Chronic medical conditions.       Charlott Rakes, MD, FAAFP. [facility], [place]   11/07/2021, 2:53 PM

## 2021-11-08 ENCOUNTER — Other Ambulatory Visit: Payer: Self-pay | Admitting: Family Medicine

## 2021-11-08 DIAGNOSIS — E890 Postprocedural hypothyroidism: Secondary | ICD-10-CM

## 2021-11-08 LAB — T4, FREE: Free T4: 0.69 ng/dL — ABNORMAL LOW (ref 0.82–1.77)

## 2021-11-08 LAB — TSH: TSH: 18.4 u[IU]/mL — ABNORMAL HIGH (ref 0.450–4.500)

## 2021-11-08 MED ORDER — LEVOTHYROXINE SODIUM 125 MCG PO TABS: 125.0000 ug | ORAL_TABLET | Freq: Every day | ORAL | 3 refills | Status: DC

## 2021-11-10 ENCOUNTER — Telehealth: Payer: Self-pay

## 2021-11-10 ENCOUNTER — Telehealth: Payer: Self-pay | Admitting: *Deleted

## 2021-11-10 NOTE — Telephone Encounter (Signed)
Left message on patients voicemail advising her to contact the office to schedule a pre op clearance appointment. ?

## 2021-11-10 NOTE — Telephone Encounter (Signed)
Primary Cardiologist:Traci Radford Pax, MD ? ?Chart reviewed as part of pre-operative protocol coverage. Because of Chris H Hackel's past medical history and time since last visit, he/she will require a virtual visit/telephone call in order to better assess preoperative cardiovascular risk. ? ?I called to verify that the dental office would like medical clearance. ? ?Pre-op covering staff: ?- Please contact patient, obtain consent, and schedule appointment  ? ?SBE prophylaxis is not required for the patient. ? ? ?Emmaline Life, NP-C ? ?  ?11/10/2021, 2:08 PM ?[facility] ?[address], [address] ?Office [phone] Fax [phone] ? ? ?

## 2021-11-10 NOTE — Telephone Encounter (Signed)
?  Patient Consent for Virtual Visit  ? ? ?   ? ?Cynthia Underwood has provided verbal consent on 11/10/2021 for a virtual visit (video or telephone). ? ? ?CONSENT FOR VIRTUAL VISIT FOR:  Cynthia Underwood  ?By participating in this virtual visit I agree to the following: ? ?I hereby voluntarily request, consent and authorize [facility] and its employed or contracted physicians, physician assistants, nurse practitioners or other licensed health care professionals (the Practitioner), to provide me with telemedicine health care services (the ?Services") as deemed necessary by the treating Practitioner. I acknowledge and consent to receive the Services by the Practitioner via telemedicine. I understand that the telemedicine visit will involve communicating with the Practitioner through live audiovisual communication technology and the disclosure of certain medical information by electronic transmission. I acknowledge that I have been given the opportunity to request an in-person assessment or other available alternative prior to the telemedicine visit and am voluntarily participating in the telemedicine visit. ? ?I understand that I have the right to withhold or withdraw my consent to the use of telemedicine in the course of my care at any time, without affecting my right to future care or treatment, and that the Practitioner or I may terminate the telemedicine visit at any time. I understand that I have the right to inspect all information obtained and/or recorded in the course of the telemedicine visit and may receive copies of available information for a reasonable fee.  I understand that some of the potential risks of receiving the Services via telemedicine include:  ?Delay or interruption in medical evaluation due to technological equipment failure or disruption; ?Information transmitted may not be sufficient (e.g. poor resolution of images) to allow for appropriate medical decision making by the Practitioner;  and/or  ?In rare instances, security protocols could fail, causing a breach of personal health information. ? ?Furthermore, I acknowledge that it is my responsibility to provide information about my medical history, conditions and care that is complete and accurate to the best of my ability. I acknowledge that Practitioner's advice, recommendations, and/or decision may be based on factors not within their control, such as incomplete or inaccurate data provided by me or distortions of diagnostic images or specimens that may result from electronic transmissions. I understand that the practice of medicine is not an exact science and that Practitioner makes no warranties or guarantees regarding treatment outcomes. I acknowledge that a copy of this consent can be made available to me via my patient portal ([facility]), or I can request a printed copy by calling the office of [facility].   ? ?I understand that my insurance will be billed for this visit.  ? ?I have read or had this consent read to me. ?I understand the contents of this consent, which adequately explains the benefits and risks of the Services being provided via telemedicine.  ?I have been provided ample opportunity to ask questions regarding this consent and the Services and have had my questions answered to my satisfaction. ?I give my informed consent for the services to be provided through the use of telemedicine in my medical care ? ? ? ?

## 2021-11-10 NOTE — Telephone Encounter (Signed)
Pt agreeable to plan of care for tele pre op appt with Almyra Deforest, [facility] 11/14/21 @ 1 pm. Med rec and consent done.  ?

## 2021-11-10 NOTE — Telephone Encounter (Signed)
? ?  Pre-operative Risk Assessment  ?  ?Patient Name: Cynthia Underwood  ?DOB: 11/25/1970 ?MRN: [id number]  ? ? ? ?Request for Surgical Clearance ?  ? ?Procedure:  Dental Extraction - Amount of Teeth to be Pulled:  9 on the top and 2 on the bottom ? ?Date of Surgery:  Clearance TBD                              ? ?Surgeon:  Dr Estrella Myrtle ?Surgeon's Group or Practice Name:  [facility] ?Phone number:  [phone] ?Fax number:  [phone] ? ?Type of Clearance Requested:   ?- Medical  ?  ?Type of Anesthesia:  Local and laughing gas ? ?Additional requests/questions:  Does this patient need antibiotics? ? ?Signed, ?Ulice Brilliant T   ?11/10/2021, 1:23 PM  ? ?

## 2021-11-14 ENCOUNTER — Ambulatory Visit (INDEPENDENT_AMBULATORY_CARE_PROVIDER_SITE_OTHER): Payer: Medicare Other

## 2021-11-14 ENCOUNTER — Ambulatory Visit (INDEPENDENT_AMBULATORY_CARE_PROVIDER_SITE_OTHER): Payer: Medicare Other | Admitting: Physician Assistant

## 2021-11-14 DIAGNOSIS — Z01810 Encounter for preprocedural cardiovascular examination: Secondary | ICD-10-CM

## 2021-11-14 DIAGNOSIS — I441 Atrioventricular block, second degree: Secondary | ICD-10-CM

## 2021-11-14 NOTE — Progress Notes (Signed)
? ?Virtual Visit via Telephone Note  ? ?This visit type was conducted due to national recommendations for restrictions regarding the COVID-19 Pandemic (e.g. social distancing) in an effort to limit this patient's exposure and mitigate transmission in our community.  Due to her co-morbid illnesses, this patient is at least at moderate risk for complications without adequate follow up.  This format is felt to be most appropriate for this patient at this time.  The patient did not have access to video technology/had technical difficulties with video requiring transitioning to audio format only (telephone).  All issues noted in this document were discussed and addressed.  No physical exam could be performed with this format.  Please refer to the patient's chart for her  consent to telehealth for [facility]. ?Evaluation Performed:  Preoperative cardiovascular risk assessment ? ?This visit type was conducted due to national recommendations for restrictions regarding the COVID-19 Pandemic (e.g. social distancing).  This format is felt to be most appropriate for this patient at this time.  All issues noted in this document were discussed and addressed.  No physical exam was performed (except for noted visual exam findings with Video Visits).  Please refer to the patient's chart (MyChart message for video visits and phone note for telephone visits) for the patient's consent to telehealth for [facility]. ?_____________  ? ?Date:  11/14/2021  ? ?Patient ID:  Lorah, Kalina [birth year], MRN [id number] ?Patient Location:  ?Home ?Provider location:   ?Office ? ?Primary Care Provider:  Charlott Rakes, MD ?Primary Cardiologist:  Fransico Him, MD ? ?Chief Complaint  ?  ?51 y.o. y/o female with a h/o myotic dystrophy type I since age 30 with intermittent jaw locking, finger stiffness and difficulty relaxing muscles, history of advanced heart block s/p pacemaker, right bundle branch block, sickle cell trait, who is  pending dental extractions, and presents today for telephonic preoperative cardiovascular risk assessment. ? ?Past Medical History  ?  ?Past Medical History:  ?Diagnosis Date  ? Bifascicular block 10/02/2014  ? Dyspnea   ? Excess ear wax   ? Multinodular goiter   ? Pneumonia   ? Presence of permanent cardiac pacemaker   ? 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  ? Sickle cell trait ([place])   ? Watery eyes   ? Left  ? Weakness of both legs   ? ?Past Surgical History:  ?Procedure Laterality Date  ? BREAST BIOPSY Right   ? EYE SURGERY Left 2017  ? "related to blockage in my nose"  ? EYE SURGERY Right 05/2019  ? INSERT / REPLACE / REMOVE PACEMAKER  02/15/2017  ? PACEMAKER IMPLANT N/A 02/15/2017  ? Procedure: Pacemaker Implant;  Surgeon: Deboraha Sprang, MD;  Location: [facility];  Service: Cardiovascular;  Laterality: N/A;  ? THYROIDECTOMY N/A 04/21/2021  ? Procedure: TOTAL THYROIDECTOMY;  Surgeon: Ralene Ok, MD;  Location: [facility];  Service: General;  Laterality: N/A;  ? ? ?Allergies ? ?Allergies  ?Allergen Reactions  ? Penicillin G Other (See Comments), Rash and Nausea And Vomiting  ?  Sore throat  ? Penicillins Nausea And Vomiting, Rash and Other (See Comments)  ?  Fever ?Has patient had a PCN reaction causing immediate rash, facial/tongue/throat swelling, SOB or lightheadedness with hypotension: Yes ?Has patient had a PCN reaction causing severe rash involving mucus membranes or skin necrosis: Unknown ?Has patient had a PCN reaction that required hospitalization: No ?Has patient had a PCN reaction occurring within the last  10 years: No ?If all of the above answers are "NO", then may proceed with Cephalosporin use. ?  ? ? ?History of Present Illness  ?  ?TIJANA WALDER is a 51 y.o. female who presents via [employer], civil (consulting) for a telehealth visit today.  Pt was last seen in cardiology clinic on 01/10/2021, by Dr. Alfonzo Feller, at that time [name] was  doing well. Since then, patient has underwent thyroid surgery without any complication.  Recent echocardiogram obtained in May 2022 showed normal EF, normal RV function without significant valve issue.   she is now pending dental extractions.  Since his last visit, she has been doing well without significant chest discomfort or worsening dyspnea. ? ? ?Home Medications  ?  ?Prior to Admission medications   ?Medication Sig Start Date End Date Taking? Authorizing Provider  ?albuterol (VENTOLIN HFA) 108 (90 Base) MCG/ACT inhaler INHALE 2 PUFFS INTO THE LUNGS EVERY 4 HOURS AS NEEDED FOR WHEEZING OR SHORTNESS OF BREATH 11/18/19   Charlott Rakes, MD  ?gabapentin (NEURONTIN) 250 MG/5ML solution Take 56m at bedtime x 1 week, then increase to 193mat bedtime. 07/06/21   PaNarda Amber, DO  ?hydrocortisone (ANUSOL-HC) 2.5 % rectal cream Place 1 application rectally 2 (two) times daily as needed for hemorrhoids or anal itching. 10/09/19   NaMauri PoleMD  ?levothyroxine (SYNTHROID) 125 MCG tablet Take 1 tablet (125 mcg total) by mouth daily before breakfast. 11/08/21   NeCharlott RakesMD  ?mirtazapine (REMERON SOL-TAB) 15 MG disintegrating tablet TAKE 1 TABLET BY MOUTH EVERYDAY AT BEDTIME 08/24/21   NeCharlott RakesMD  ?tiZANidine (ZANAFLEX) 4 MG tablet Take 1 tablet (4 mg total) by mouth every 8 (eight) hours as needed for muscle spasms. 07/19/20   NeCharlott RakesMD  ? ? ?Physical Exam  ?  ?Vital Signs:  VaKeiley Leveyorick does not have vital signs available for review today. ? ?Given telephonic nature of communication, physical exam is limited. ?AAOx3. NAD. Normal affect.  Speech and respirations are unlabored. ? ?Accessory Clinical Findings  ?  ?None ? ?Assessment & Plan  ?  ?1.  Preoperative Cardiovascular Risk Assessment:  ? -Patient has pending dental procedure.  She denies any recent chest pain worsening dyspnea.  Last echocardiogram obtained in May 2022 was reassuring.  She is cleared from the cardiac perspective  to proceed with dental extraction.  She does not require any SBE prophylaxis. ? ?COVID-19 Education: ?The signs and symptoms of COVID-19 were discussed with the patient and how to seek care for testing (follow up with PCP or arrange E-visit).  The importance of social distancing was discussed today. ? ?Patient Risk:   ?After full review of this patient's history and clinical status, I feel that he is at least moderate risk for cardiac complications at this time, thus necessitating a telehealth visit sooner than our first available in office visit. ? ?Time:   ?Today, I have spent 5 minutes with the patient with telehealth technology discussing medical history, symptoms, and management plan.   ? ? ?[place]myra Deforest[place] ?11/14/2021, 1:24 PM ? ?

## 2021-11-15 LAB — CUP PACEART REMOTE DEVICE CHECK
Battery Remaining Longevity: 81 mo
Battery Voltage: 2.97 V
Brady Statistic AP VP Percent: 52.03 %
Brady Statistic AP VS Percent: 0 %
Brady Statistic AS VP Percent: 47.97 %
Brady Statistic AS VS Percent: 0 %
Brady Statistic RA Percent Paced: 52.02 %
Brady Statistic RV Percent Paced: 100 %
Date Time Interrogation Session: 20230313021304
Implantable Lead Implant Date: 20180614
Implantable Lead Implant Date: 20180614
Implantable Lead Location: 753859
Implantable Lead Location: 753859
Implantable Lead Model: 3830
Implantable Lead Model: 5076
Implantable Pulse Generator Implant Date: 20180614
Lead Channel Impedance Value: 266 Ohm
Lead Channel Impedance Value: 342 Ohm
Lead Channel Impedance Value: 361 Ohm
Lead Channel Impedance Value: 494 Ohm
Lead Channel Pacing Threshold Amplitude: 0.625 V
Lead Channel Pacing Threshold Amplitude: 1.25 V
Lead Channel Pacing Threshold Pulse Width: 0.4 ms
Lead Channel Pacing Threshold Pulse Width: 0.4 ms
Lead Channel Sensing Intrinsic Amplitude: 3.125 mV
Lead Channel Sensing Intrinsic Amplitude: 3.125 mV
Lead Channel Sensing Intrinsic Amplitude: 5.375 mV
Lead Channel Sensing Intrinsic Amplitude: 5.375 mV
Lead Channel Setting Pacing Amplitude: 2 V
Lead Channel Setting Pacing Amplitude: 2 V
Lead Channel Setting Pacing Pulse Width: 1 ms
Lead Channel Setting Sensing Sensitivity: 2 mV

## 2021-11-25 ENCOUNTER — Encounter: Payer: Self-pay | Admitting: Neurology

## 2021-11-28 NOTE — Progress Notes (Signed)
Remote pacemaker transmission.   

## 2021-12-15 DIAGNOSIS — H26491 Other secondary cataract, right eye: Secondary | ICD-10-CM | POA: Diagnosis not present

## 2021-12-15 DIAGNOSIS — Z961 Presence of intraocular lens: Secondary | ICD-10-CM | POA: Diagnosis not present

## 2021-12-15 DIAGNOSIS — H524 Presbyopia: Secondary | ICD-10-CM | POA: Diagnosis not present

## 2022-01-11 ENCOUNTER — Ambulatory Visit: Payer: Medicare Other | Attending: Family Medicine | Admitting: Family Medicine

## 2022-01-11 ENCOUNTER — Encounter: Payer: Self-pay | Admitting: Family Medicine

## 2022-01-11 VITALS — BP 99/64 | HR 80 | Ht 63.0 in | Wt 96.6 lb

## 2022-01-11 DIAGNOSIS — E890 Postprocedural hypothyroidism: Secondary | ICD-10-CM

## 2022-01-11 DIAGNOSIS — R634 Abnormal weight loss: Secondary | ICD-10-CM

## 2022-01-11 DIAGNOSIS — G4709 Other insomnia: Secondary | ICD-10-CM

## 2022-01-11 MED ORDER — MIRTAZAPINE 30 MG PO TBDP: 30.0000 mg | ORAL_TABLET | Freq: Every day | ORAL | 1 refills | Status: DC

## 2022-01-11 NOTE — Progress Notes (Signed)
[place] ?No appetite.  ?

## 2022-01-11 NOTE — Progress Notes (Signed)
? ?Subjective:  ?Patient ID: Cynthia Underwood, female    DOB: 02-08-1971  Age: 51 y.o. MRN: [id number] ? ?CC: Hypothyroidism ? ? ?HPI ?Cynthia Underwood is a 51 y.o. 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 ?Interval History: ?Complains of difficulty falling and staying asleep for the last few weeks. She watches TV when she is unable to fall asleep. She does not exercise  ?Endorses taking daytime naps.  Denies caffeine intake late in the day. ? ?She also has no appetite and feels full all the time and this is chronic for her.  Her weight fluctuates between 93 pounds and 106 pounds.  She has lost 6 pounds in the last 3 months.  Last colonoscopy was in 10/2019 and next due in 10/2022.  Multiple sessile polyps the largest of which measured 10 mm were retrieved from transverse and ascending colon.pathology revealed tubular adenoma, no high-grade dysplasia.  She has no abdominal pain. ?I had placed her on Remeron previously but she has not been consistent in taking it. ? ?She is currently on levothyroxine for her hypothyroidism and last TSH was elevated. ?Past Medical History:  ?Diagnosis Date  ? Bifascicular block 10/02/2014  ? Dyspnea   ? Excess ear wax   ? Multinodular goiter   ? Pneumonia   ? Presence of permanent cardiac pacemaker   ? 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  ? Sickle cell trait ([place])   ? Watery eyes   ? Left  ? Weakness of both legs   ? ? ?Past Surgical History:  ?Procedure Laterality Date  ? BREAST BIOPSY Right   ? EYE SURGERY Left 2017  ? "related to blockage in my nose"  ? EYE SURGERY Right 05/2019  ? INSERT / REPLACE / REMOVE PACEMAKER  02/15/2017  ? PACEMAKER IMPLANT N/A 02/15/2017  ? Procedure: Pacemaker Implant;  Surgeon: Deboraha Sprang, MD;  Location: [facility];  Service: Cardiovascular;  Laterality: N/A;  ? THYROIDECTOMY N/A 04/21/2021  ? Procedure: TOTAL THYROIDECTOMY;  Surgeon: Ralene Ok, MD;  Location: [facility];  Service: General;  Laterality: N/A;  ? ? ?Family History  ?Problem Relation Age of Onset  ? Cancer Father 71  ?     Deceased  ? Heart disease Father   ? COPD Mother   ? Diabetes Maternal Uncle   ? Heart disease Maternal Uncle   ? Heart disease Paternal Grandmother   ? Diabetes Paternal Grandmother   ? Hypertension Paternal Grandmother   ? Neuromuscular disorder Maternal Aunt   ?     Myotonic dystrophy  ? Neuromuscular disorder Cousin   ?     Myotonic dystrophy  ? Neuromuscular disorder Sister   ?     Myotonic dystrophy  ? Colon cancer Neg Hx   ? Colon polyps Neg Hx   ? Esophageal cancer Neg Hx   ? Rectal cancer Neg Hx   ? Stomach cancer Neg Hx   ? ? ?Social History  ? ?Socioeconomic History  ? Marital status: Single  ?  Spouse name: Not on file  ? Number of children: 0  ? Years of education: 1.5 Collge  ? Highest education level: Not on file  ?Occupational History  ? Occupation: Unemployed  ?  Employer: OTHER  ?Tobacco Use  ? Smoking status:  Former  ?  Packs/day: 0.00  ?  Years: 28.00  ?  Pack years: 0.00  ?  Types: Cigarettes  ?  Quit date: 10/04/2016  ?  Years since quitting: 5.2  ? Smokeless tobacco: Never  ? Tobacco comments:  ?  smoking is intermittent - not ready to quit  ?Vaping Use  ? Vaping Use: Never used  ?Substance and Sexual Activity  ? Alcohol use: Not Currently  ?  Alcohol/week: 0.0 standard drinks  ?  Comment:  few times a year  ? Drug use: Not Currently  ?  Types: Marijuana  ?  Comment: occ   ? Sexual activity: Yes  ?  Birth control/protection: None, Condom  ?Other Topics Concern  ? Not on file  ?Social History Narrative  ? Patient lives at home with her aunt.  ? 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  She moved to [facility] in August 2015.  ? She has been on disability since  2010.  ? Education: 1 1/2 years of college.  ? Caffeine - coke 2 cans/day  ? Exercise - no  ? ?Social Determinants of Health  ? ?Financial Resource Strain: Not on file  ?Food Insecurity: Not on file  ?Transportation Needs: Not on file  ?Physical Activity: Not on file  ?Stress: Not on file  ?Social Connections: Not on file  ? ? ?Allergies  ?Allergen Reactions  ? Penicillin G Other (See Comments), Rash and Nausea And Vomiting  ?  Sore throat  ? Penicillins Nausea And Vomiting, Rash and Other (See Comments)  ?  Fever ?Has patient had a PCN reaction causing immediate rash, facial/tongue/throat swelling, SOB or lightheadedness with hypotension: Yes ?Has patient had a PCN reaction causing severe rash involving mucus membranes or skin necrosis: Unknown ?Has patient had a PCN reaction that required hospitalization: No ?Has patient had a PCN reaction occurring within the last 10 years: No ?If all of the above answers are "NO", then may proceed with Cephalosporin use. ?  ? ? ?Outpatient Medications Prior to Visit  ?Medication Sig Dispense Refill  ? albuterol (VENTOLIN HFA) 108 (90 Base) MCG/ACT inhaler INHALE 2 PUFFS INTO THE LUNGS EVERY 4 HOURS AS NEEDED FOR WHEEZING OR SHORTNESS OF BREATH 18 g 1  ? gabapentin (NEURONTIN) 250 MG/5ML solution Take 28m at bedtime x 1 week, then increase to 127mat bedtime. 470 mL 5  ? hydrocortisone (ANUSOL-HC) 2.5 % rectal cream Place 1 application rectally 2 (two) times daily as needed for hemorrhoids or anal itching. 30 g 1  ? levothyroxine (SYNTHROID) 125 MCG tablet Take 1 tablet (125 mcg total) by mouth daily before breakfast. 30 tablet 3  ? tiZANidine (ZANAFLEX) 4 MG tablet Take 1 tablet (4 mg total) by mouth every 8 (eight) hours as needed for muscle spasms. 60 tablet 1  ? mirtazapine (REMERON SOL-TAB) 15 MG disintegrating tablet TAKE 1 TABLET BY MOUTH EVERYDAY AT BEDTIME 90 tablet 1  ? ?No facility-administered medications prior to visit.  ? ? ? ?ROS ?Review of Systems   ?Constitutional:  Positive for appetite change and unexpected weight change. Negative for activity change and fatigue.  ?HENT:  Negative for congestion, sinus pressure and sore throat.   ?Eyes:  Negative for visual disturbance.  ?Respiratory:  Negative for cough, chest tightness, shortness of breath and wheezing.   ?Cardiovascular:  Negative for chest pain and palpitations.  ?Gastrointestinal:  Negative for abdominal distention, abdominal pain and constipation.  ?Endocrine: Negative for polydipsia.  ?Genitourinary:  Negative for dysuria and frequency.  ?Musculoskeletal:  Negative for arthralgias and back pain.  ?Skin:  Negative for rash.  ?Neurological:  Negative for tremors, light-headedness and numbness.  ?Hematological:  Does not bruise/bleed easily.  ?Psychiatric/Behavioral:  Negative for agitation and behavioral problems.   ? ?Objective:  ?BP 99/64   Pulse 80   Ht '5\' 3"'$  (1.6 m)   Wt 96 lb 9.6 oz (43.8 kg)   LMP 12/07/2016   SpO2 97%   BMI 17.11 kg/m?  ? ? ?  01/11/2022  ?  3:17 PM 11/07/2021  ?  2:08 PM 07/25/2021  ?  3:11 PM  ?BP/Weight  ?Systolic BP 99 99 450  ?Diastolic BP 64 66 75  ?Wt. (Lbs) 96.6 103.6 93.4  ?BMI 17.11 kg/m2 18.35 kg/m2 16.55 kg/m2  ? ? ? ? ?Physical Exam ?Constitutional:   ?   Appearance: She is well-developed.  ?   Comments: Underweight  ?Cardiovascular:  ?   Rate and Rhythm: Normal rate.  ?   Heart sounds: Normal heart sounds. No murmur heard. ?Pulmonary:  ?   Effort: Pulmonary effort is normal.  ?   Breath sounds: Normal breath sounds. No wheezing or rales.  ?Chest:  ?   Chest wall: No tenderness.  ?Abdominal:  ?   General: Bowel sounds are normal. There is no distension.  ?   Palpations: Abdomen is soft. There is no mass.  ?   Tenderness: There is no abdominal tenderness.  ?Musculoskeletal:     ?   General: Normal range of motion.  ?   Right lower leg: No edema.  ?   Left lower leg: No edema.  ?Neurological:  ?   Mental Status: She is alert and oriented to person, place, and time.   ?Psychiatric:     ?   Mood and Affect: Mood normal.  ? ? ? ?  Latest Ref Rng & Units 04/22/2021  ?  2:28 AM 03/22/2021  ? 11:40 AM 09/11/2018  ? 11:32 AM  ?CMP  ?Glucose 70 - 99 mg/dL 137   105   85    ?BUN 6 - 20 mg/dL 5   17   1

## 2022-01-12 ENCOUNTER — Other Ambulatory Visit: Payer: Self-pay | Admitting: Family Medicine

## 2022-01-12 DIAGNOSIS — E890 Postprocedural hypothyroidism: Secondary | ICD-10-CM

## 2022-01-12 LAB — CBC WITH DIFFERENTIAL/PLATELET
Basophils Absolute: 0 10*3/uL (ref 0.0–0.2)
Basos: 0 %
EOS (ABSOLUTE): 0.1 10*3/uL (ref 0.0–0.4)
Eos: 2 %
Hematocrit: 37.5 % (ref 34.0–46.6)
Hemoglobin: 12.9 g/dL (ref 11.1–15.9)
Immature Grans (Abs): 0 10*3/uL (ref 0.0–0.1)
Immature Granulocytes: 0 %
Lymphocytes Absolute: 2.5 10*3/uL (ref 0.7–3.1)
Lymphs: 60 %
MCH: 32.8 pg (ref 26.6–33.0)
MCHC: 34.4 g/dL (ref 31.5–35.7)
MCV: 95 fL (ref 79–97)
Monocytes Absolute: 0.2 10*3/uL (ref 0.1–0.9)
Monocytes: 5 %
Neutrophils Absolute: 1.4 10*3/uL (ref 1.4–7.0)
Neutrophils: 33 %
Platelets: 179 10*3/uL (ref 150–450)
RBC: 3.93 x10E6/uL (ref 3.77–5.28)
RDW: 13.2 % (ref 11.7–15.4)
WBC: 4.2 10*3/uL (ref 3.4–10.8)

## 2022-01-12 LAB — CMP14+EGFR
ALT: 13 IU/L (ref 0–32)
AST: 22 IU/L (ref 0–40)
Albumin/Globulin Ratio: 1.7 (ref 1.2–2.2)
Albumin: 4.3 g/dL (ref 3.8–4.9)
Alkaline Phosphatase: 46 IU/L (ref 44–121)
BUN/Creatinine Ratio: 19 (ref 9–23)
BUN: 11 mg/dL (ref 6–24)
Bilirubin Total: 0.3 mg/dL (ref 0.0–1.2)
CO2: 24 mmol/L (ref 20–29)
Calcium: 9.5 mg/dL (ref 8.7–10.2)
Chloride: 107 mmol/L — ABNORMAL HIGH (ref 96–106)
Creatinine, Ser: 0.59 mg/dL (ref 0.57–1.00)
Globulin, Total: 2.5 g/dL (ref 1.5–4.5)
Glucose: 80 mg/dL (ref 70–99)
Potassium: 3.3 mmol/L — ABNORMAL LOW (ref 3.5–5.2)
Sodium: 146 mmol/L — ABNORMAL HIGH (ref 134–144)
Total Protein: 6.8 g/dL (ref 6.0–8.5)
eGFR: 108 mL/min/{1.73_m2} (ref >59)

## 2022-01-12 LAB — T4, FREE: Free T4: 2.4 ng/dL — ABNORMAL HIGH (ref 0.82–1.77)

## 2022-01-12 LAB — TSH: TSH: 0.013 u[IU]/mL — ABNORMAL LOW (ref 0.450–4.500)

## 2022-01-12 MED ORDER — POTASSIUM CHLORIDE ER 10 MEQ PO TBCR: 10.0000 meq | EXTENDED_RELEASE_TABLET | Freq: Every day | ORAL | 1 refills | Status: DC

## 2022-01-12 MED ORDER — LEVOTHYROXINE SODIUM 100 MCG PO TABS: 100.0000 ug | ORAL_TABLET | Freq: Every day | ORAL | 3 refills | Status: DC

## 2022-02-09 ENCOUNTER — Other Ambulatory Visit: Payer: Self-pay | Admitting: Family Medicine

## 2022-04-07 ENCOUNTER — Ambulatory Visit: Payer: Medicare Other | Admitting: Neurology

## 2022-05-16 ENCOUNTER — Telehealth: Payer: Self-pay | Admitting: Internal Medicine

## 2022-05-16 NOTE — Telephone Encounter (Signed)
Forwarding as FYI

## 2022-05-16 NOTE — Telephone Encounter (Signed)
Patient stated she will not be able to do her heart checks for the next two weeks as her device is not working and [employer] will be sending her a new one.  The battery is dead in the hand held part and will not hold anymore information.

## 2022-05-19 ENCOUNTER — Other Ambulatory Visit: Payer: Self-pay | Admitting: Family Medicine

## 2022-05-19 ENCOUNTER — Encounter: Payer: Self-pay | Admitting: Neurology

## 2022-05-19 ENCOUNTER — Ambulatory Visit (INDEPENDENT_AMBULATORY_CARE_PROVIDER_SITE_OTHER): Payer: Medicare Other | Admitting: Neurology

## 2022-05-19 VITALS — HR 100 | Resp 18 | Ht 63.0 in | Wt 104.0 lb

## 2022-05-19 DIAGNOSIS — G7111 Myotonic muscular dystrophy: Secondary | ICD-10-CM | POA: Diagnosis not present

## 2022-05-19 NOTE — Progress Notes (Signed)
Follow-up Visit   Date: 05/19/22    Cynthia Underwood MRN: [id number] DOB: Oct 12, 1970   Interim History: ADDILYNE Underwood is a 51 y.o. 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  The patient was accompanied to the clinic by self.  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  Disease complicated by cataracts, cardiac arrhthymias s/p PPM, and dyspnea. She does not have diabetes.  She has myalgias and has tried gabapentin and cymbalta in the past, neither effective. She has ongoing weakness as expected with the disease course.  I will refer her to out-patient PT and OT for strengthening.   Return to clinic in 1 year   ----------------------------------------------------------------------- History of present illness: Starting around the age of 62, she started having lock jaw and stiffness of the hands.  Over the years, she developed worsening stiffness of her fingers and hands.  Because her maternal aunt, who is also a patient of mine, had known myotonic dystrophy, she ultimately was genetically tested at the age of 60 which confirmed the diagnosis of myotonic dystrophy type 1. She has a strong family history of DM1 including maternal aunt, cousins x 2, and younger sister.  She does not have any children and has no future plans for pregnancy.  Symptoms were relatively stable until her mid-30s and then started developing fatigue, weakness, daytime sleepiness, and shortness of breath with exertion.  She walks independently but was told previously told to use leg braces.  Over the past few years, she noticed intermittent difficulty swallowing liquids. She underwent barium swallow which showed signs of aspiration and she was recommended to use a straw and use chin tuck position.     She was seeing several neurologists over the years at [facility] in [place].  She is not working and has been on disability since 2010.  She established care with me in February 2016.   In fall of 2016, she had to move her bedroom to the [place] because of difficulty climbing stairs.  Since completing home PT/OT, her muscle strength has improved and sometimes, she is able to walk without AFO.  By 2017, she was able to climb stairs and went back to sleeping upstairs. She was started in Cymbalta '30mg'$  for mood, fatigue and myalgias which significantly helped, but self-discontinued this in 2018 due to feeling it was ineffective.  In 2022, she was tried on gabapentin, but this did not help.   She followed by cardiology for abnormal EKG with RBBB and LAFB as well as myocardial thinning.  In 2018, she had dual chamber PPM implanted by Dr. Caryl Comes.  She is also undergoing pulmonology work-up for dyspnea, which is suggestive of restrictive changes, consistent with muscular weakness due to DM1.    UPDATE 05/19/2022:  She is here for follow-up visit.  She continues to have weakness in the arms and legs, difficulty with grip/reaching for objects and climbing stairs.  She does not regularly wear her AFOs.  Fortunately, she denies any falls.  She is has noticed improved swallow after her thyroid was removed earlier this year.  Her PCP has increased mirtazapine to '30mg'$ /d, but this has not improved her sleep.  Prior sleep study did not show OSA.   Medications:  Current Outpatient Medications on File Prior to Visit  Medication Sig Dispense Refill   mirtazapine (REMERON  SOL-TAB) 30 MG disintegrating tablet Take 1 tablet (30 mg total) by mouth at bedtime. 90 tablet 1   tiZANidine (ZANAFLEX) 4 MG tablet Take 1 tablet (4 mg total) by mouth every 8 (eight) hours as needed for muscle spasms. 60 tablet 1   albuterol (VENTOLIN HFA) 108 (90 Base) MCG/ACT inhaler INHALE 2 PUFFS INTO THE LUNGS EVERY 4 HOURS AS NEEDED  FOR WHEEZING OR SHORTNESS OF BREATH (Patient not taking: Reported on 05/19/2022) 18 g 1   gabapentin (NEURONTIN) 250 MG/5ML solution Take 31m at bedtime x 1 week, then increase to 1107mat bedtime. (Patient not taking: Reported on 05/19/2022) 470 mL 5   hydrocortisone (ANUSOL-HC) 2.5 % rectal cream Place 1 application rectally 2 (two) times daily as needed for hemorrhoids or anal itching. (Patient not taking: Reported on 05/19/2022) 30 g 1   potassium chloride (KLOR-CON) 10 MEQ tablet TAKE 1 TABLET BY MOUTH EVERY DAY (Patient not taking: Reported on 05/19/2022) 30 tablet 1   No current facility-administered medications on file prior to visit.    Allergies:  Allergies  Allergen Reactions   Penicillin G Other (See Comments), Rash and Nausea And Vomiting    Sore throat   Penicillins Nausea And Vomiting, Rash and Other (See Comments)    Fever Has patient had a PCN reaction causing immediate rash, facial/tongue/throat swelling, SOB or lightheadedness with hypotension: Yes Has patient had a PCN reaction causing severe rash involving mucus membranes or skin necrosis: Unknown Has patient had a PCN reaction that required hospitalization: No Has patient had a PCN reaction occurring within the last 10 years: No If all of the above answers are "NO", then may proceed with Cephalosporin use.     Vital Signs:  Pulse 100   Resp 18   Ht '5\' 3"'$  (1.6 m)   Wt 104 lb (47.2 kg)   LMP 12/03/2016 (Approximate)   SpO2 98%   BMI 18.42 kg/m   Neurological Exam: MENTAL STATUS including orientation to time, place, person, recent and remote memory, attention span and concentration, language, and fund of knowledge is normal.  Speech is mildly dysarthric and hypophonic.    CRANIAL NERVES:  Normal conjugate, extra-ocular eye movements in all directions of gaze. Mild bilateral ptosis. Typical mytonic facies. Mild facial weakness with buccinator testing. Tongue strength is 5-/5  MOTOR:  Generalized loss of muscle bulk.  Mild grip myotonia bilaterally.  Right Upper Extremity:    Left Upper Extremity:    Deltoid  5-/5   Deltoid  5-/5   Biceps  5-/5   Biceps  5-/5   Triceps  5-/5   Triceps  5-/5   Wrist extensors  5/5   Wrist extensors  5/5   Wrist flexors  5/5   Wrist flexors  5/5   Finger extensors  4/5   Finger extensors  4/5   Finger flexors  4/5   Finger flexors  4/5   Dorsal interossei  4/5   Dorsal interossei  4/5   Abductor pollicis  4/5   Abductor pollicis  4/5   Tone (Ashworth scale)  0  Tone (Ashworth scale)  0   Right Lower Extremity:    Left Lower Extremity:    Hip flexors  4+/5   Hip flexors  4+/5   Hip extensors  4/5   Hip extensors  4/5   Knee flexors  5/5   Knee flexors  5/5   Knee extensors  5/5   Knee extensors  5/5   Dorsiflexors  4/5  Dorsiflexors  4/5   Plantarflexors  5-/5   Plantarflexors  5-/5   Tone (Ashworth scale)  0  Tone (Ashworth scale)  0   COORDINATION/GAIT:   Gait is stable, mild dragging of the feet bilaterally, unassisted    Data: Lab Results  Component Value Date   HGBA1C 5.7 01/09/2017       Thank you for allowing me to participate in patient's care.  If I can answer any additional questions, I would be pleased to do so.    Sincerely,    Girlie Veltri K. Posey Pronto, DO

## 2022-05-19 NOTE — Patient Instructions (Signed)
Refer for out-patient occupational the physical therapy  Return to clinic in 1 year

## 2022-05-22 ENCOUNTER — Ambulatory Visit (INDEPENDENT_AMBULATORY_CARE_PROVIDER_SITE_OTHER): Payer: Medicare Other

## 2022-05-22 DIAGNOSIS — I441 Atrioventricular block, second degree: Secondary | ICD-10-CM | POA: Diagnosis not present

## 2022-05-23 LAB — CUP PACEART REMOTE DEVICE CHECK
Battery Remaining Longevity: 74 mo
Battery Voltage: 2.97 V
Brady Statistic AP VP Percent: 40.5 %
Brady Statistic AP VS Percent: 8.46 %
Brady Statistic AS VP Percent: 49.25 %
Brady Statistic AS VS Percent: 1.79 %
Brady Statistic RA Percent Paced: 48.85 %
Brady Statistic RV Percent Paced: 89.77 %
Date Time Interrogation Session: 20230917141116
Implantable Lead Implant Date: 20180614
Implantable Lead Implant Date: 20180614
Implantable Lead Location: 753859
Implantable Lead Location: 753859
Implantable Lead Model: 3830
Implantable Lead Model: 5076
Implantable Pulse Generator Implant Date: 20180614
Lead Channel Impedance Value: 266 Ohm
Lead Channel Impedance Value: 342 Ohm
Lead Channel Impedance Value: 380 Ohm
Lead Channel Impedance Value: 494 Ohm
Lead Channel Pacing Threshold Amplitude: 0.625 V
Lead Channel Pacing Threshold Amplitude: 1 V
Lead Channel Pacing Threshold Pulse Width: 0.4 ms
Lead Channel Pacing Threshold Pulse Width: 0.4 ms
Lead Channel Sensing Intrinsic Amplitude: 3 mV
Lead Channel Sensing Intrinsic Amplitude: 3 mV
Lead Channel Sensing Intrinsic Amplitude: 3.75 mV
Lead Channel Sensing Intrinsic Amplitude: 3.75 mV
Lead Channel Setting Pacing Amplitude: 2 V
Lead Channel Setting Pacing Amplitude: 2 V
Lead Channel Setting Pacing Pulse Width: 1 ms
Lead Channel Setting Sensing Sensitivity: 2 mV

## 2022-06-05 NOTE — Progress Notes (Signed)
Remote pacemaker transmission.   

## 2022-06-17 ENCOUNTER — Other Ambulatory Visit: Payer: Self-pay | Admitting: Family Medicine

## 2022-06-19 NOTE — Telephone Encounter (Signed)
Requested medication (s) are due for refill today: yes  Requested medication (s) are on the active medication list: yes  Last refill:  05/19/22, 30 day supply  Future visit scheduled: no  Notes to clinic:  Unable to refill per protocol due to failed labs, no updated results. Patient needs TSH lab work, routing for review.     Requested Prescriptions  Pending Prescriptions Disp Refills   levothyroxine (SYNTHROID) 100 MCG tablet [Pharmacy Med Name: LEVOTHYROXINE 100 MCG TABLET] 30 tablet 0    Sig: TAKE 1 TABLET BY MOUTH EVERY DAY BEFORE BREAKFAST     Endocrinology:  Hypothyroid Agents Failed - 06/17/2022 12:20 AM      Failed - TSH in normal range and within 360 days    TSH  Date Value Ref Range Status  01/11/2022 0.013 (L) 0.450 - 4.500 uIU/mL Final         Passed - Valid encounter within last 12 months    Recent Outpatient Visits           5 months ago Postoperative hypothyroidism   [place], Charlane Ferretti, MD   7 months ago Myotonic dystrophy, type 1 ([place])   [place] Charlott Rakes, MD   10 months ago Encounter for [employer] annual wellness exam   [place], Charlane Ferretti, MD   1 year ago Goiter diffuse   [place] Jodi Marble, MD   1 year ago Myotonic dystrophy, type 1 [facility])   [place], MD       Future Appointments             In 4 weeks Charlott Rakes, MD [place]

## 2022-06-21 ENCOUNTER — Other Ambulatory Visit: Payer: Self-pay | Admitting: Family Medicine

## 2022-06-21 DIAGNOSIS — R634 Abnormal weight loss: Secondary | ICD-10-CM

## 2022-06-21 DIAGNOSIS — G4709 Other insomnia: Secondary | ICD-10-CM

## 2022-07-02 ENCOUNTER — Other Ambulatory Visit: Payer: Self-pay | Admitting: Family Medicine

## 2022-07-03 NOTE — Telephone Encounter (Signed)
Requested medication (s) are due for refill today: yes  Requested medication (s) are on the active medication list:yes  Last refill:  06/19/22  Future visit scheduled: no  Notes to clinic:  Unable to refill per protocol due to failed labs, no updated results.      Requested Prescriptions  Pending Prescriptions Disp Refills   levothyroxine (SYNTHROID) 100 MCG tablet [Pharmacy Med Name: LEVOTHYROXINE 100 MCG TABLET] 90 tablet 1    Sig: TAKE 1 TABLET BY MOUTH EVERY DAY BEFORE BREAKFAST     Endocrinology:  Hypothyroid Agents Failed - 07/02/2022  8:44 PM      Failed - TSH in normal range and within 360 days    TSH  Date Value Ref Range Status  01/11/2022 0.013 (L) 0.450 - 4.500 uIU/mL Final         Passed - Valid encounter within last 12 months    Recent Outpatient Visits           5 months ago Postoperative hypothyroidism   [place], Charlane Ferretti, MD   7 months ago Myotonic dystrophy, type 1 ([place])   [place] Charlott Rakes, MD   11 months ago Encounter for [employer] annual wellness exam   [place], Charlane Ferretti, MD   1 year ago Goiter diffuse   [place] Jodi Marble, MD   1 year ago Myotonic dystrophy, type 1 [facility])   [place], MD       Future Appointments             In 2 weeks Charlott Rakes, MD [place]

## 2022-07-06 ENCOUNTER — Ambulatory Visit: Payer: Medicare Other | Admitting: Physical Therapy

## 2022-07-06 ENCOUNTER — Encounter: Payer: Medicare Other | Admitting: Occupational Therapy

## 2022-07-17 ENCOUNTER — Ambulatory Visit: Payer: Medicare Other | Admitting: Family Medicine

## 2022-07-18 ENCOUNTER — Telehealth: Payer: Self-pay | Admitting: Internal Medicine

## 2022-07-18 NOTE — Telephone Encounter (Signed)
Patient called stating she will not be able to do her heart monitor as she is not in the state of [place].  She will not be back until 07/23/22.

## 2022-07-19 NOTE — Telephone Encounter (Signed)
Noted  

## 2022-08-21 ENCOUNTER — Ambulatory Visit (INDEPENDENT_AMBULATORY_CARE_PROVIDER_SITE_OTHER): Payer: Medicare Other

## 2022-08-21 DIAGNOSIS — I441 Atrioventricular block, second degree: Secondary | ICD-10-CM

## 2022-08-22 LAB — CUP PACEART REMOTE DEVICE CHECK
Battery Remaining Longevity: 63 mo
Battery Voltage: 2.96 V
Brady Statistic AP VP Percent: 42.54 %
Brady Statistic AP VS Percent: 20.58 %
Brady Statistic AS VP Percent: 35.48 %
Brady Statistic AS VS Percent: 1.4 %
Brady Statistic RA Percent Paced: 63.06 %
Brady Statistic RV Percent Paced: 78.03 %
Date Time Interrogation Session: 20231218202944
Implantable Lead Connection Status: 753985
Implantable Lead Connection Status: 753985
Implantable Lead Implant Date: 20180614
Implantable Lead Implant Date: 20180614
Implantable Lead Location: 753859
Implantable Lead Location: 753859
Implantable Lead Model: 3830
Implantable Lead Model: 5076
Implantable Pulse Generator Implant Date: 20180614
Lead Channel Impedance Value: 285 Ohm
Lead Channel Impedance Value: 342 Ohm
Lead Channel Impedance Value: 380 Ohm
Lead Channel Impedance Value: 494 Ohm
Lead Channel Pacing Threshold Amplitude: 0.625 V
Lead Channel Pacing Threshold Amplitude: 1.375 V
Lead Channel Pacing Threshold Pulse Width: 0.4 ms
Lead Channel Pacing Threshold Pulse Width: 0.4 ms
Lead Channel Sensing Intrinsic Amplitude: 3 mV
Lead Channel Sensing Intrinsic Amplitude: 3 mV
Lead Channel Sensing Intrinsic Amplitude: 3.125 mV
Lead Channel Sensing Intrinsic Amplitude: 3.125 mV
Lead Channel Setting Pacing Amplitude: 2 V
Lead Channel Setting Pacing Amplitude: 2 V
Lead Channel Setting Pacing Pulse Width: 1 ms
Lead Channel Setting Sensing Sensitivity: 2 mV
Zone Setting Status: 755011
Zone Setting Status: 755011

## 2022-09-18 ENCOUNTER — Ambulatory Visit: Payer: 59 | Admitting: Occupational Therapy

## 2022-09-18 ENCOUNTER — Encounter: Payer: Self-pay | Admitting: Occupational Therapy

## 2022-09-18 ENCOUNTER — Ambulatory Visit: Payer: 59 | Attending: Family Medicine | Admitting: Physical Therapy

## 2022-09-18 DIAGNOSIS — R2689 Other abnormalities of gait and mobility: Secondary | ICD-10-CM | POA: Insufficient documentation

## 2022-09-18 DIAGNOSIS — R2681 Unsteadiness on feet: Secondary | ICD-10-CM | POA: Diagnosis not present

## 2022-09-18 DIAGNOSIS — Z9181 History of falling: Secondary | ICD-10-CM | POA: Insufficient documentation

## 2022-09-18 DIAGNOSIS — G7111 Myotonic muscular dystrophy: Secondary | ICD-10-CM | POA: Insufficient documentation

## 2022-09-18 DIAGNOSIS — M6281 Muscle weakness (generalized): Secondary | ICD-10-CM

## 2022-09-18 DIAGNOSIS — R29898 Other symptoms and signs involving the musculoskeletal system: Secondary | ICD-10-CM

## 2022-09-18 NOTE — Therapy (Signed)
OUTPATIENT PHYSICAL THERAPY NEURO EVALUATION   Patient Name: Cynthia Underwood MRN: [id number] DOB:1971/01/05, 52 y.o., female Today's Date: 09/18/2022   PCP: Charlott Rakes, MD REFERRING PROVIDER: Alda Berthold, DO  END OF SESSION:  PT End of Session - 09/18/22 1400     Visit Number 1    Number of Visits 13   Plus eval   Date for PT Re-Evaluation 11/06/22   Due to delay in scheduling   Authorization Type [employer] Medicare    Progress Note Due on Visit 10    PT Start Time 1400    PT Stop Time 1440    PT Time Calculation (min) 40 min    Activity Tolerance Patient tolerated treatment well    Behavior During Therapy [facility] for tasks assessed/performed             Past Medical History:  Diagnosis Date   Bifascicular block 10/02/2014   Dyspnea    Excess ear wax    Multinodular goiter    Pneumonia    Presence of permanent cardiac pacemaker    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   Sickle cell trait ([place])    Watery eyes    Left   Weakness of both legs    Past Surgical History:  Procedure Laterality Date   BREAST BIOPSY Right    EYE SURGERY Left 2017   "related to blockage in my nose"   EYE SURGERY Right 05/2019   INSERT / REPLACE / REMOVE PACEMAKER  02/15/2017   PACEMAKER IMPLANT N/A 02/15/2017   Procedure: Pacemaker Implant;  Surgeon: Deboraha Sprang, MD;  Location: [place] [facility];  Service: Cardiovascular;  Laterality: N/A;   THYROIDECTOMY N/A 04/21/2021   Procedure: TOTAL THYROIDECTOMY;  Surgeon: Ralene Ok, MD;  Location: [place];  Service: General;  Laterality: N/A;   Patient Active Problem List   Diagnosis Date Noted   Hypothyroidism 11/07/2021   Thyroid nodule 04/21/2021   S/P total thyroidectomy 04/21/2021   Sinus node dysfunction ([place]) 03/10/2020   Weakness of both legs    Watery eyes    Sickle cell trait ([place])    Presence of permanent cardiac pacemaker    Excess ear wax    Depression    Anemia     Cardiac pacemaker in situ 03/01/2017   Mobitz type 2 second degree heart block 02/15/2017   Symptomatic advanced heart block 02/12/2017   SOB (shortness of breath) 10/02/2014   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   Bifascicular block 10/02/2014   Infection due to trichomonas vaginalis 09/15/2014   Myotonic dystrophy, 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 States they are metal but have laces. Had a fall about 3moago in her home, was able to get up on her own. Lives on second floor in her home and has to frequently ascend/descend stairs. Avoids stairs if she can. Pt reports she has to move slowly, if she moves too quickly she often loses balance backwards. Denies frequent falls but then stated she "falls all the time"   Pt accompanied by: self  PERTINENT HISTORY: Starting around the age of 89 she started having lock jaw and stiffness of the hands.  Over the years, she developed worsening stiffness of her fingers and hands.  Because her maternal aunt, who is also a patient of mine, had known myotonic dystrophy, she ultimately was genetically tested at the age of 89which confirmed the diagnosis of myotonic dystrophy type 1. She has a strong family history of DM1 including maternal aunt, cousins x 2, and younger sister.  She does not have any children and  has no future plans for pregnancy.   Symptoms were relatively stable until her mid-30s and then started developing fatigue, weakness, daytime sleepiness, and shortness of breath with exertion.  She walks independently but was told previously told to use leg braces.  Over the past few years, she noticed intermittent difficulty swallowing liquids. She underwent barium swallow which showed signs of aspiration and she was recommended to use a straw and use chin tuck position.     She was [place] over the [place]  She is not working and has been on disability since 2010.  PAIN:  Are you having pain? No Pt reports frequently having pain in legs, worse during day   PRECAUTIONS: Fall and ICD/Pacemaker  WEIGHT BEARING RESTRICTIONS: No  FALLS: Has patient fallen in last 6 months? Yes. Number of falls at least 1  LIVING ENVIRONMENT: Lives with: lives with their family Lives in: House/apartment on 2nd floor Stairs: Yes: Internal: 14 steps; on left going up Has following equipment at home: Quad cane large base, W[employer]al consultant- 2 wheeled, and [employer]- 4 wheeled  PLOF: Independent with basic ADLs and uses AD occasionally   PATIENT GOALS: "My legs getting stronger without the pain"  OBJECTIVE:   COGNITION: Overall cognitive status: Difficulty to assess due to: no family present   SENSATION: Pt denies numbness/tingling in BPorum hypotonic bilaterally   POSTURE: rounded shoulders   LOWER EXTREMITY MMT:  tested in seated position   MMT Right Eval Left Eval  Hip flexion 3- 3  Hip extension    Hip abduction 3+ 3+  Hip adduction 4- 4-  Hip internal rotation    Hip external rotation    Knee flexion 3+ 3  Knee extension 3+ 3  Ankle dorsiflexion 3 3  Ankle plantarflexion    Ankle inversion    Ankle eversion    (Blank rows = not tested)  BED MOBILITY:  Independent per pt  TRANSFERS: Assistive device utilized: None   Sit to stand: SBA Stand to sit: SBA Pt relies on BUE support to perform   STAIRS: Level of Assistance: Modified independence Stair Negotiation Technique: Alternating Pattern  with Single Rail on Left Number of Stairs: 4  Height of Stairs: 6"  Comments: Noted reliance on adductors and ER of hips to  clear foot on step  GAIT: Gait pattern:  ER of BLEs, step through pattern, decreased arm swing- Right, decreased arm swing- Left, decreased stride length, decreased hip/knee flexion- Right, decreased hip/knee flexion- Left, decreased ankle dorsiflexion- Right, decreased ankle dorsiflexion- Left, narrow BOS, poor foot clearance- Right, and poor foot clearance- Left Distance walked: Various clinic distances  Assistive device utilized: None Level of assistance: SBA Comments: Pt ambulates cautiously   FUNCTIONAL TESTS:   [facility] PT Assessment - 09/18/22 1419       Transfers   Five time sit to stand comments  23.34s w/BUE support   RPE of 5/10     Ambulation/Gait   Gait velocity 32.8' over 11.93s = 2.75 ft/s without AD              TODAY'S TREATMENT:     Next Session                                                                                                                       PATIENT EDUCATION: Education details: *** Person educated: {Person educated:25204} Education method: {Education Method:25205} Education comprehension: {Education Comprehension:25206}  HOME EXERCISE PROGRAM: ***  GOALS: Goals reviewed with patient? {yes/no:20286}  SHORT TERM GOALS: Target date: ***  Floor transfer   Baseline: Goal status: {GOALSTATUS:25110}  2.  *** Baseline:  Goal status: {GOALSTATUS:25110}  3.  *** Baseline:  Goal status: {GOALSTATUS:25110}  4.  *** Baseline:  Goal status: {GOALSTATUS:25110}  5.  *** Baseline:  Goal status: {GOALSTATUS:25110}  6.  *** Baseline:  Goal status: {GOALSTATUS:25110}  LONG TERM GOALS: Target date: ***  *** Baseline:  Goal  status: {GOALSTATUS:25110}  2.  *** Baseline:  Goal status: {GOALSTATUS:25110}  3.  *** Baseline:  Goal status: {GOALSTATUS:25110}  4.  *** Baseline:  Goal status: {GOALSTATUS:25110}  5.  *** Baseline:  Goal status: {GOALSTATUS:25110}  6.  *** Baseline:  Goal status: {GOALSTATUS:25110}  ASSESSMENT:  CLINICAL IMPRESSION: Patient is a 52 year old female referred to Neuro O[facility] for myotonic dystrophy. Pt's PMH is significant for: myotonic dystrophy type I complicated by cardiac arryhtmia s/p PPM (June 2018), and hyperlipidemia. The following deficits were present during the exam: ***. Based on ***, pt is an incr risk for falls. Pt would benefit from skilled PT to address these impairments and functional limitations to maximize functional mobility independence   OBJECTIVE IMPAIRMENTS: {opptimpairments:25111}.   ACTIVITY LIMITATIONS: {activitylimitations:27494}  PARTICIPATION LIMITATIONS: {participationrestrictions:25113}  PERSONAL FACTORS: {Personal factors:25162} are also affecting patient's functional outcome.   REHAB POTENTIAL: {rehabpotential:25112}  CLINICAL DECISION MAKING: {clinical decision making:25114}  EVALUATION COMPLEXITY: {Evaluation complexity:25115}  PLAN:  PT FREQUENCY: {rehab frequency:25116}  PT DURATION: {rehab duration:25117}  PLANNED INTERVENTIONS: {rehab planned interventions:25118::"Therapeutic exercises","Therapeutic activity","Neuromuscular re-education","Balance training","Gait training","Patient/Family education","Self Care","Joint mobilization"}  PLAN FOR NEXT SESSION: ***   Annelisa Ryback E Jeraldin Fesler, PT, DPT 09/18/2022, 2:43 PM

## 2022-09-18 NOTE — Therapy (Signed)
OUTPATIENT OCCUPATIONAL THERAPY NEURO EVALUATION  Patient Name: Cynthia Underwood MRN: [id number] DOB:03/08/1971, 52 y.o., female Today's Date: 09/18/2022  PCP: Charlott Rakes, MD REFERRING PROVIDER: Alda Berthold, DO  END OF SESSION:  OT End of Session - 09/18/22 1413     Visit Number 1    Number of Visits 17   pt may only need 8 visits if coming 1x/wk, up to 17 visits if coming 2x/wk   Authorization Type UHC MCR primary, MCD secondary    Progress Note Due on Visit 10    OT Start Time 1315    OT Stop Time 1355    OT Time Calculation (min) 40 min    Activity Tolerance Patient tolerated treatment well    Behavior During Therapy [facility] for tasks assessed/performed             Past Medical History:  Diagnosis Date   Bifascicular block 10/02/2014   Dyspnea    Excess ear wax    Multinodular goiter    Pneumonia    Presence of permanent cardiac pacemaker    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   Sickle cell trait ([place])    Watery eyes    Left   Weakness of both legs    Past Surgical History:  Procedure Laterality Date   BREAST BIOPSY Right    EYE SURGERY Left 2017   "related to blockage in my nose"   EYE SURGERY Right 05/2019   INSERT / REPLACE / REMOVE PACEMAKER  02/15/2017   PACEMAKER IMPLANT N/A 02/15/2017   Procedure: Pacemaker Implant;  Surgeon: Deboraha Sprang, MD;  Location: [place] [facility];  Service: Cardiovascular;  Laterality: N/A;   THYROIDECTOMY N/A 04/21/2021   Procedure: TOTAL THYROIDECTOMY;  Surgeon: Ralene Ok, MD;  Location: [place];  Service: General;  Laterality: N/A;   Patient Active Problem List   Diagnosis Date Noted   Hypothyroidism 11/07/2021   Thyroid nodule 04/21/2021   S/P total thyroidectomy 04/21/2021   Sinus node dysfunction ([place]) 03/10/2020   Weakness of both legs    Watery eyes    Sickle cell trait ([place])    Presence of permanent cardiac pacemaker    Excess ear wax    Depression     Anemia    Cardiac pacemaker in situ 03/01/2017   Mobitz type 2 second degree heart block 02/15/2017   Symptomatic advanced heart block 02/12/2017   SOB (shortness of breath) 10/02/2014   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   Bifascicular block 10/02/2014   Infection due to trichomonas vaginalis 09/15/2014   Myotonic dystrophy, 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    ONSET DATE: 05/24/2022  REFERRING DIAG: G71.11 (ICD-10-CM) - Myotonic muscular dystrophy (TYPE I)   THERAPY DIAG:  Muscle weakness (generalized)  Other symptoms and signs involving the musculoskeletal system  Unsteadiness on feet  Rationale for Evaluation and Treatment: Rehabilitation  SUBJECTIVE:   SUBJECTIVE STATEMENT: I just need to get more strength Pt accompanied by: self  PERTINENT HISTORY: right-handed [place] American female with myotonic dystrophy type I complicated by cardiac arryhtmia s/p PPM (June 2018), and hyperlipidemia   PRECAUTIONS: ICD/Pacemaker, NO heavy lifting  WEIGHT BEARING RESTRICTIONS: No  PAIN:  Are you having pain? No  FALLS: Has patient fallen in last 6 months? Yes. Number of falls 1  LIVING ENVIRONMENT: Lives with: lives with their family Lives in: house, 2 story with level entry Has following equipment at home:  AFO's  PLOF: Independent with basic ADLs  PATIENT GOALS: increase strength in arms and hands  OBJECTIVE:   HAND DOMINANCE: Right  ADLs: Overall ADLs: overall mod I Transfers/ambulation related to ADLs: Eating: mod I - difficulty cutting food Grooming: mod I  UB Dressing: mod I  LB Dressing: mod I  - leaves shoes untied Toileting: independent Bathing: independent Tub Shower transfers: step over tub/shower combo - feels unsteady, takes her time Equipment: none **pt reports difficulty opening containers, soda bottles  IADLs: Shopping: cousin goes normally for pt Light housekeeping: light housework (washing dishes,  takes garbage out, laundry)  Meal Prep: pt does not cook, but does snacks, sandwiches, heats up things in microwave or rarely heats up things on stove Community mobility: family or medical transport for transportation Medication management: forget the pm medicine  Handwriting: 100% legible  MOBILITY STATUS: Independent    UPPER EXTREMITY ROM:  BUE AROM WFL's but very weak   UPPER EXTREMITY MMT:   BUE grossly 3/5   HAND FUNCTION: Grip strength: Right: 21.3 lbs; Left: 20.9 lbs Lateral pinch: Rt = 4, Lt = 6 lbs 3 tip pinch: Rt = 5, Lt = 6  COORDINATION: 9 Hole Peg test: Right: 25.37 sec; Left: 29.66 sec  SENSATION: WFL  EDEMA: none  MUSCLE TONE: RUE: Hypotonic and LUE: Hypotonic  COGNITION: Overall cognitive status: Within functional limits for tasks assessed  VISION: Subjective report: ? Cataracts with surgery Baseline vision: Wears glasses all the time Visual history: cataracts  OBSERVATIONS: Pt reports increased decline in the last several months   TODAY'S TREATMENT:                                                                                                                              DATE: n/a evaluation only   PATIENT EDUCATION: Education details: OT POC/goals Person educated: Patient Education method: Explanation Education comprehension: verbalized understanding  HOME EXERCISE PROGRAM: N/A today   GOALS: Goals reviewed with patient? Yes  SHORT TERM GOALS: Target date: 10/19/22  Independent with initial HEP for BUE's Baseline: Goal status: INITIAL  2.  Pt to verbalize understanding of DME (tub bench) and A/E (jar & bottle openers, etc) that would increase safety, ease, and/or independence with ADLS Baseline:  Goal status: INITIAL  3.  Pt will verbalize strategy for remembering pm medicine Baseline:  Goal status: INITIAL    LONG TERM GOALS: Target date: 11/17/22  Independent with updated HEP for BUE's Baseline:  Goal status:  INITIAL  2.  Improve bilateral grip strength by 5 lbs to increase ease with opening jars/containers Baseline: Rt = 21 lbs, Lt = 20 lbs Goal status: INITIAL  3.  Pt to demo tub/shower transfer safely with tub bench prn Baseline:  Goal status: INITIAL   ASSESSMENT:  CLINICAL IMPRESSION: Patient is a 52 y.o. female who was seen today for occupational therapy evaluation for myotonic muscular dystrophy (type I). Pt progressively getting weaker (as part of  disease process) however is finding progression is hindering daily activities and therefore would benefit from O.T. to address these deficits as it relates to function  PERFORMANCE DEFICITS: in functional skills including ADLs, IADLs, coordination, tone, ROM, strength, pain, muscle spasms, Fine motor control, balance, body mechanics, endurance, cardiopulmonary status limiting function, decreased knowledge of use of DME, and UE functional use.   IMPAIRMENTS: are limiting patient from ADLs, IADLs, leisure, and social participation.   CO-MORBIDITIES: may have co-morbidities  that affects occupational performance. Patient will benefit from skilled OT to address above impairments and improve overall function.  MODIFICATION OR ASSISTANCE TO COMPLETE EVALUATION: No modification of tasks or assist necessary to complete an evaluation.  OT OCCUPATIONAL PROFILE AND HISTORY: Problem focused assessment: Including review of records relating to presenting problem.  CLINICAL DECISION MAKING: Moderate - several treatment options, min-mod task modification necessary  REHAB POTENTIAL: Good  EVALUATION COMPLEXITY: Low    PLAN:  OT FREQUENCY: 1-2x/week  OT DURATION: up to 8 weeks (plus eval)  PLANNED INTERVENTIONS: self care/ADL training, therapeutic exercise, therapeutic activity, neuromuscular re-education, manual therapy, passive range of motion, functional mobility training, aquatic therapy, moist heat, patient/family education, cognitive  remediation/compensation, visual/perceptual remediation/compensation, coping strategies training, and DME and/or AE instructions  RECOMMENDED OTHER SERVICES: none at this time  CONSULTED AND AGREED WITH PLAN OF CARE: Patient  PLAN FOR NEXT SESSION: HEP (BUE cane HEP, and  putty HEP)   Hans Eden, OT 09/18/2022, 2:14 PM

## 2022-09-26 ENCOUNTER — Encounter: Payer: Self-pay | Admitting: Rehabilitation

## 2022-09-26 ENCOUNTER — Ambulatory Visit: Payer: 59 | Admitting: Rehabilitation

## 2022-09-26 DIAGNOSIS — M6281 Muscle weakness (generalized): Secondary | ICD-10-CM | POA: Diagnosis not present

## 2022-09-26 DIAGNOSIS — Z9181 History of falling: Secondary | ICD-10-CM | POA: Diagnosis not present

## 2022-09-26 DIAGNOSIS — R2681 Unsteadiness on feet: Secondary | ICD-10-CM

## 2022-09-26 DIAGNOSIS — R2689 Other abnormalities of gait and mobility: Secondary | ICD-10-CM | POA: Diagnosis not present

## 2022-09-26 DIAGNOSIS — G7111 Myotonic muscular dystrophy: Secondary | ICD-10-CM | POA: Diagnosis not present

## 2022-09-26 DIAGNOSIS — R29898 Other symptoms and signs involving the musculoskeletal system: Secondary | ICD-10-CM

## 2022-09-26 NOTE — Therapy (Signed)
OUTPATIENT PHYSICAL THERAPY NEURO TREATMENT    Patient Name: Cynthia Underwood MRN: [id number] DOB:[birth year], 52 y.o., female Today's Date: 09/26/2022   PCP: Charlott Rakes, MD REFERRING PROVIDER: Alda Berthold, DO  END OF SESSION:  PT End of Session - 09/26/22 1508     Visit Number 2    Number of Visits 13   Plus eval   Date for PT Re-Evaluation 11/06/22   Due to delay in scheduling   Authorization Type [employer] Medicare    Progress Note Due on Visit 10    PT Start Time 1150    PT Stop Time 1230    PT Time Calculation (min) 40 min    Activity Tolerance Patient tolerated treatment well    Behavior During Therapy [facility] for tasks assessed/performed             Past Medical History:  Diagnosis Date   Bifascicular block 10/02/2014   Dyspnea    Excess ear wax    Multinodular goiter    Pneumonia    Presence of permanent cardiac pacemaker    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   Sickle cell trait ([place])    Watery eyes    Left   Weakness of both legs    Past Surgical History:  Procedure Laterality Date   BREAST BIOPSY Right    EYE SURGERY Left 2017   "related to blockage in my nose"   EYE SURGERY Right 05/2019   INSERT / REPLACE / REMOVE PACEMAKER  02/15/2017   PACEMAKER IMPLANT N/A 02/15/2017   Procedure: Pacemaker Implant;  Surgeon: Deboraha Sprang, MD;  Location: [place] [facility];  Service: Cardiovascular;  Laterality: N/A;   THYROIDECTOMY N/A 04/21/2021   Procedure: TOTAL THYROIDECTOMY;  Surgeon: Ralene Ok, MD;  Location: [place];  Service: General;  Laterality: N/A;   Patient Active Problem List   Diagnosis Date Noted   Hypothyroidism 11/07/2021   Thyroid nodule 04/21/2021   S/P total thyroidectomy 04/21/2021   Sinus node dysfunction ([place]) 03/10/2020   Weakness of both legs    Watery eyes    Sickle cell trait ([place])    Presence of permanent cardiac pacemaker    Excess ear wax    Depression    Anemia     Cardiac pacemaker in situ 03/01/2017   Mobitz type 2 second degree heart block 02/15/2017   Symptomatic advanced heart block 02/12/2017   SOB (shortness of breath) 10/02/2014   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   Bifascicular block 10/02/2014   Infection due to trichomonas vaginalis 09/15/2014   Myotonic dystrophy, 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     Pt accompanied by: self  PERTINENT HISTORY: Starting around the age of 53, she started having lock jaw and stiffness of the hands.  Over the years, she developed worsening stiffness of her fingers and hands.  Because her maternal aunt, who is also a patient of mine, had known myotonic dystrophy, she ultimately was genetically tested at the age of 42 which confirmed the diagnosis of myotonic dystrophy type 1. She has a strong family history of DM1 including maternal aunt, cousins x 2, and younger sister.  She does not have any children and has no future plans for pregnancy.   Symptoms were relatively stable until her mid-30s and then started developing fatigue, weakness, daytime sleepiness, and shortness of breath with exertion.  She walks independently but was told previously told to use leg braces.  Over the past few years, she noticed intermittent difficulty swallowing liquids. She underwent barium swallow which showed signs of aspiration  and she was recommended to use a straw and use chin tuck position.     She was seeing several neurologists over the years at [place] in [place].  She is not working and has been on disability since 2010.  PAIN:  Are you having pain? No Pt reports frequently having pain in legs, worse during day   PRECAUTIONS: Fall and ICD/Pacemaker  WEIGHT BEARING RESTRICTIONS: No  FALLS: Has patient fallen in last 6 months? Yes. Number of falls at least 1  LIVING ENVIRONMENT: Lives with: lives with their family Lives in: House/apartment on 2nd floor Stairs: Yes: Internal: 14 steps; on left going up Has following equipment at home: Quad cane large base, Environmental consultant - 2 wheeled, and Environmental consultant - 4 wheeled  PLOF: Independent with basic ADLs and uses AD occasionally   PATIENT GOALS: "My legs getting stronger without the pain"  OBJECTIVE:   COGNITION: Overall cognitive status: Difficulty to assess due to: no family present   SENSATION: Pt denies numbness/tingling in [place]: hypotonic bilaterally   POSTURE: rounded shoulders   LOWER EXTREMITY MMT:  tested in seated position   MMT Right Eval Left Eval  Hip flexion 3- 3  Hip extension    Hip abduction 3+ 3+  Hip adduction 4- 4-  Hip internal rotation    Hip external rotation    Knee flexion 3+ 3  Knee extension 3+ 3  Ankle dorsiflexion 3 3  Ankle plantarflexion    Ankle inversion    Ankle eversion    (Blank rows = not tested)    TODAY'S TREATMENT:        Patient seen for aquatic therapy today.  Treatment took place in water 3.6-4.0 feet deep depending upon activity.  Pt entered and exited the pool via stairs using B rails in alternating fashion.    In approx 4' dept, had pt ambulate approx 16' x 4 laps without support, backwards x 2 laps (same distance), side stepping x 2 laps again without support and cues for technique.  Backwards and side stepping done holding yellow dumbells for support.  Intermittent  min/guard for safety.     NMR:  Standing near side of pool for UE support as needed; kept RLE in stance moving LLE into hip flex (with knee in ext) x 10 reps, then LLE into ext x 10 reps (again knee in ext).  Transitioned to RLE moving into flexion as above x 10  reps, extension x 10 reps.   R then LLE circles moving clockwise x 10 reps then counter clockwise x 10 reps.  All performed with light UE support as it was esp difficult to maintain balance on LLE.    Ended with Ai Chi Enclosing posture x 10 reps (mild difficulty with balance but able to self correct), Soothing posture x 10 reps with improved balance.      Standing with feet together EO maintaining balance x 20 secs, EO with head turns horizontally and vertically x 10 reps each.  Slight dizziness with vertical head motions but was better during second rep.  Feet together EC x 2 sets of 15 secs.  Mild CGA needed at times.  Alt forward stepping x 10 reps, backwards stepping x 10 reps.  PT providing verbal and tactile cues for full weight shift towards stepping foot.  Then performed forward, lateral, backwards stepping on LLE x 3 sets and on RLE x 3 sets.  Pt with most difficulty with posterior stepping.     Utilized small 4" step in pool with single UE support performing forward step ups x 10 reps each side leading, then lateral step ups x 10 reps each direction.  BUE support used for lateral step up.      Pt requires buoyancy of water for support for reduced fall risk, viscosity of water is needed for resistance for strengthening and current of water provides perturbations for challenge for balance training.                                                                                                                              PATIENT EDUCATION: Education details: reasoning for exercises in pool, schedule going forward  Person educated: Patient Education method: Explanation, Demonstration, Tactile cues, and Verbal cues Education  comprehension: needs further education  HOME EXERCISE PROGRAM: To be established   GOALS: Goals reviewed with patient? Yes  SHORT TERM GOALS: Target date: 10/10/2022    Floor transfer to be assessed and LTG written  Baseline: Goal status: INITIAL  2.  Pt will improve 5 x STS to less than or equal to 20 seconds w/BUE support to demonstrate improved functional strength and transfer efficiency.   Baseline: 23.34s w/BUE support  Goal status: INITIAL  3.  Pt will bring in AFOs for gait training in clinic  Baseline:  Goal status: INITIAL  4.  Pt will be independent with initial land HEP for improved strength, balance, transfers and gait.  Baseline: not established on eval  Goal status: INITIAL   LONG TERM GOALS: Target date: 11/07/2022   Floor transfer goal  Baseline:  Goal status: INITIAL  2.  Pt will be independent with final land and aquatic HEP for improved strength, balance, transfers and gait.  Baseline:  Goal status: INITIAL  3.  Pt will improve gait velocity to at least 3.0 ft/s w/LRAD mod I for improved gait efficiency and independence  Baseline: 2.75  ft/s without AD Goal status: INITIAL  4.  Pt will improve 5 x STS to less than or equal to 16 seconds w/BUE support to demonstrate improved functional strength and transfer efficiency.   Baseline: 23.34s w/BUE support Goal status: INITIAL   ASSESSMENT:  CLINICAL IMPRESSION: Session done in pool today and utilized buoyancy to reduce fall risk and viscosity of water for resistance and strength training.  Emphasized high level balance with narrowed BOS and stepping strategy along with BLE strengthening throughout.  Pt tolerated well, needing only min/guard for intermittent LOB. Continue with POC.     OBJECTIVE IMPAIRMENTS: Abnormal gait, decreased activity tolerance, decreased balance, decreased cognition, decreased coordination, decreased endurance, decreased knowledge of condition, decreased knowledge of use of  DME, decreased mobility, difficulty walking, decreased strength, decreased safety awareness, increased muscle spasms, impaired tone, postural dysfunction, and pain.   ACTIVITY LIMITATIONS: carrying, lifting, standing, squatting, sleeping, stairs, locomotion level, and caring for others  PARTICIPATION LIMITATIONS: laundry, driving, shopping, community activity, occupation, and yard work  PERSONAL FACTORS: Behavior pattern, Education, Fitness, Past/current experiences, Transportation, and 1 comorbidity: myotonic dystrophy type 1  are also affecting patient's functional outcome.   REHAB POTENTIAL: Fair due to poor compliance w/therapy in past and lack of social support  CLINICAL DECISION MAKING: Evolving/moderate complexity  EVALUATION COMPLEXITY: Moderate  PLAN:  PT FREQUENCY: 2x/week (1x on land and 1x aquatic)   PT DURATION: 6 weeks  PLANNED INTERVENTIONS: Therapeutic exercises, Therapeutic activity, Neuromuscular re-education, Balance training, Gait training, Patient/Family education, Self Care, Joint mobilization, Stair training, Vestibular training, Canalith repositioning, Orthotic/Fit training, DME instructions, Aquatic Therapy, Dry Needling, Manual therapy, and Re-evaluation  PLAN FOR NEXT SESSION: How did she tolerate the pool? Did she bring AFOs? HEP for BLE strength, walking program, assess floor transfer    Cameron Sprang, PT, MPT [facility] [address] [place], [place], [address] Phone: [phone]   Fax:  [phone] 09/26/22, 3:09 PM

## 2022-09-27 NOTE — Progress Notes (Signed)
Remote pacemaker transmission.   

## 2022-09-28 ENCOUNTER — Ambulatory Visit: Payer: 59 | Admitting: Occupational Therapy

## 2022-09-28 ENCOUNTER — Ambulatory Visit: Payer: 59 | Admitting: Physical Therapy

## 2022-09-28 DIAGNOSIS — Z9181 History of falling: Secondary | ICD-10-CM | POA: Diagnosis not present

## 2022-09-28 DIAGNOSIS — R2681 Unsteadiness on feet: Secondary | ICD-10-CM | POA: Diagnosis not present

## 2022-09-28 DIAGNOSIS — G7111 Myotonic muscular dystrophy: Secondary | ICD-10-CM | POA: Diagnosis not present

## 2022-09-28 DIAGNOSIS — M6281 Muscle weakness (generalized): Secondary | ICD-10-CM

## 2022-09-28 DIAGNOSIS — R2689 Other abnormalities of gait and mobility: Secondary | ICD-10-CM | POA: Diagnosis not present

## 2022-09-28 DIAGNOSIS — R29898 Other symptoms and signs involving the musculoskeletal system: Secondary | ICD-10-CM | POA: Diagnosis not present

## 2022-09-28 NOTE — Therapy (Signed)
OUTPATIENT PHYSICAL THERAPY NEURO TREATMENT   Patient Name: Cynthia Underwood MRN: [id number] DOB:10-13-1970, 52 y.o., female Today's Date: 09/28/2022   PCP: Charlott Rakes, MD REFERRING PROVIDER: Alda Berthold, DO  END OF SESSION:  PT End of Session - 09/28/22 0918     Visit Number 3    Number of Visits 13   Plus eval   Date for PT Re-Evaluation 11/06/22   Due to delay in scheduling   Authorization Type [employer] Medicare    Progress Note Due on Visit 10    PT Start Time 0917    PT Stop Time 1002    PT Time Calculation (min) 45 min    Equipment Utilized During Treatment Other (comment)   Bilateral ISO braces   Activity Tolerance Patient tolerated treatment well    Behavior During Therapy [facility] for tasks assessed/performed              Past Medical History:  Diagnosis Date   Bifascicular block 10/02/2014   Dyspnea    Excess ear wax    Multinodular goiter    Pneumonia    Presence of permanent cardiac pacemaker    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   Sickle cell trait ([place])    Watery eyes    Left   Weakness of both legs    Past Surgical History:  Procedure Laterality Date   BREAST BIOPSY Right    EYE SURGERY Left 2017   "related to blockage in my nose"   EYE SURGERY Right 05/2019   INSERT / REPLACE / REMOVE PACEMAKER  02/15/2017   PACEMAKER IMPLANT N/A 02/15/2017   Procedure: Pacemaker Implant;  Surgeon: Deboraha Sprang, MD;  Location: [facility] [facility];  Service: Cardiovascular;  Laterality: N/A;   THYROIDECTOMY N/A 04/21/2021   Procedure: TOTAL THYROIDECTOMY;  Surgeon: Ralene Ok, MD;  Location: [place];  Service: General;  Laterality: N/A;   Patient Active Problem List   Diagnosis Date Noted   Hypothyroidism 11/07/2021   Thyroid nodule 04/21/2021   S/P total thyroidectomy 04/21/2021   Sinus node dysfunction ([place]) 03/10/2020   Weakness of both legs    Watery eyes    Sickle cell trait ([place])    Presence of  permanent cardiac pacemaker    Excess ear wax    Depression    Anemia    Cardiac pacemaker in situ 03/01/2017   Mobitz type 2 second degree heart block 02/15/2017   Symptomatic advanced heart block 02/12/2017   SOB (shortness of breath) 10/02/2014   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   Bifascicular block 10/02/2014   Infection due to trichomonas vaginalis 09/15/2014   Myotonic dystrophy, 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 Reports aquatic therapy went well, no pain following. No changes since last visit    Pt accompanied by: self  PERTINENT HISTORY: Starting around the age of 89, she started having lock jaw and stiffness of the hands.  Over the years, she developed worsening stiffness of her fingers and hands.  Because her maternal aunt, who is also a patient of mine, had known myotonic dystrophy, she ultimately was genetically tested at the age of 89 which confirmed the diagnosis of myotonic dystrophy type 1. She has a strong family history of DM1 including maternal aunt, cousins x 2, and younger sister.  She does not have any children and has no future plans for pregnancy.   Symptoms were relatively stable until her mid-30s and then started developing fatigue, weakness, daytime sleepiness, and shortness of breath with exertion.  She walks independently but was told previously told to use leg braces.  Over the past few years,  she noticed intermittent difficulty swallowing liquids. She underwent barium swallow which showed signs of aspiration and she was recommended to use a straw and use chin tuck position.     She was seeing several neurologists over the years at [facility] in [place].  She is not working and has been on disability since 2010.  PAIN:  Are you having pain? No Pt reports frequently having pain in legs, worse during day   PRECAUTIONS: Fall and ICD/Pacemaker  WEIGHT BEARING RESTRICTIONS: No  FALLS: Has patient fallen in last 6 months? Yes. Number of falls at least 1  LIVING ENVIRONMENT: Lives with: lives with their family Lives in: House/apartment on 2nd floor Stairs: Yes: Internal: 14 steps; on left going up Has following equipment at home: Quad cane large base, Environmental consultant - 2 wheeled, and Environmental consultant - 4 wheeled  PLOF: Independent with basic ADLs and uses AD occasionally   PATIENT GOALS: "My legs getting stronger without the pain"  OBJECTIVE:   COGNITION: Overall cognitive status: Difficulty to assess due to: no family present   SENSATION: Pt denies numbness/tingling in [place]: hypotonic bilaterally   LOWER EXTREMITY MMT:  tested in seated position   MMT Right Eval Left Eval  Hip flexion 3- 3  Hip extension    Hip abduction 3+ 3+  Hip adduction 4- 4-  Hip internal rotation    Hip external rotation    Knee flexion 3+ 3  Knee extension 3+ 3  Ankle dorsiflexion 3 3  Ankle plantarflexion    Ankle inversion    Ankle eversion    (Blank rows = not tested)  TODAY'S TREATMENT:   Gait Training  Pt has ISO preferred secure ankle braces, not AFOs. Upon showing pt an AFO, she reported having custom thermoplastic AFOs but she does not know where they are. Provided min A to don ISO braces without cloth support piece, as this is too bulky to fit into pt's shoe.   GAIT: Gait pattern:  ER of BLEs, step through pattern, decreased arm swing- Right, decreased arm swing-  Left, decreased stride length, decreased hip/knee flexion- Right, decreased hip/knee flexion- Left, decreased ankle dorsiflexion- Right, decreased ankle dorsiflexion- Left, narrow BOS, poor foot clearance- Right, and poor foot clearance- Left Distance walked: 115' Assistive device utilized: None Level of assistance: SBA Comments: Pt demonstrates reliance on adductors for swing phase and decreased step clearance w/RLE > LLE.    Gait pattern:  ER of BLEs, step through pattern, decreased  arm swing- Right, decreased arm swing- Left, decreased stride length, decreased hip/knee flexion- Right, decreased hip/knee flexion- Left, decreased ankle dorsiflexion- Right, decreased ankle dorsiflexion- Left, narrow BOS, poor foot clearance- Right, and poor foot clearance- Left Distance walked: 230' Assistive device utilized:  Bilateral ISO braces  Level of assistance: SBA Comments: Pt reported discomfort w/R brace rubbing the lateral aspect of her foot. Did not note any major gait improvements w/brace vs. No brace   Self-care/home management  Discussed timeline of pt receiving AFOs as pt unable to fully remember. At this time, believe pt received thermoplastic AFOs in [place], moved to [place] and did not like braces so was sent to [facility] via [facility] to be fitted for new braces. Pt remembers being fitted for braces but unable to remember which type or where they are. Pt to look for braces at home and pt to reach out to [facility] to inquire which braces she received.                                                                                          PATIENT EDUCATION: Education details: Plan to look for braces  Person educated: Patient Education method: Explanation, Demonstration, Tactile cues, and Verbal cues Education comprehension: needs further education  HOME EXERCISE PROGRAM: To be established   GOALS: Goals reviewed with patient? Yes  SHORT TERM GOALS: Target date: 10/10/2022    Floor transfer  to be assessed and LTG written  Baseline: Goal status: INITIAL  2.  Pt will improve 5 x STS to less than or equal to 20 seconds w/BUE support to demonstrate improved functional strength and transfer efficiency.   Baseline: 23.34s w/BUE support  Goal status: INITIAL  3.  Pt will bring in AFOs for gait training in clinic  Baseline:  Goal status: INITIAL  4.  Pt will be independent with initial land HEP for improved strength, balance, transfers and gait.  Baseline: not established on eval  Goal status: INITIAL   LONG TERM GOALS: Target date: 11/07/2022   Floor transfer goal  Baseline:  Goal status: INITIAL  2.  Pt will be independent with final land and aquatic HEP for improved strength, balance, transfers and gait.  Baseline:  Goal status: INITIAL  3.  Pt will improve gait velocity to at least 3.0 ft/s w/LRAD mod I for improved gait efficiency and independence  Baseline: 2.75 ft/s without AD Goal status: INITIAL  4.  Pt will improve 5 x STS to less than or equal to 16 seconds w/BUE support to demonstrate improved functional strength and transfer efficiency.   Baseline: 23.34s w/BUE support Goal status: INITIAL   ASSESSMENT:  CLINICAL IMPRESSION: Emphasis of skilled PT session on gait training w/ISO braces and pt education. Pt came to clinic w/box of ISO ankle braces and a TSLO brace, unsure who ordered them or why. Trialed use of ISO braces but they are very difficult to don and do not significantly improve pt's gait pattern. Pt unable to recall where her old AFOs are, but is to look for them at home. Pt would be eligible for new AFOs as she last received some in  2016, so therapist to contact [facility] to determine which type of AFO pt received and trial AFOs in clinic. Continue POC.    OBJECTIVE IMPAIRMENTS: Abnormal gait, decreased activity tolerance, decreased balance, decreased cognition, decreased coordination, decreased endurance, decreased knowledge of condition,  decreased knowledge of use of DME, decreased mobility, difficulty walking, decreased strength, decreased safety awareness, increased muscle spasms, impaired tone, postural dysfunction, and pain.   ACTIVITY LIMITATIONS: carrying, lifting, standing, squatting, sleeping, stairs, locomotion level, and caring for others  PARTICIPATION LIMITATIONS: laundry, driving, shopping, community activity, occupation, and yard work  PERSONAL FACTORS: Behavior pattern, Education, Fitness, Past/current experiences, Transportation, and 1 comorbidity: myotonic dystrophy type 1  are also affecting patient's functional outcome.   REHAB POTENTIAL: Fair due to poor compliance w/therapy in past and lack of social support  CLINICAL DECISION MAKING: Evolving/moderate complexity  EVALUATION COMPLEXITY: Moderate  PLAN:  PT FREQUENCY: 2x/week (1x on land and 1x aquatic)   PT DURATION: 6 weeks  PLANNED INTERVENTIONS: Therapeutic exercises, Therapeutic activity, Neuromuscular re-education, Balance training, Gait training, Patient/Family education, Self Care, Joint mobilization, Stair training, Vestibular training, Canalith repositioning, Orthotic/Fit training, DME instructions, Aquatic Therapy, Dry Needling, Manual therapy, and Re-evaluation  PLAN FOR NEXT SESSION: Did she bring AFOs? Did Careers adviser? Establish HEP - sit <>stands, marches, monster walks, calf raises, ankle inv/ev    Yarisbel Miranda E Alanna Storti, PT, DPT 09/28/2022, 10:06 AM

## 2022-10-03 ENCOUNTER — Encounter: Payer: Self-pay | Admitting: Rehabilitation

## 2022-10-03 ENCOUNTER — Ambulatory Visit: Payer: 59 | Admitting: Rehabilitation

## 2022-10-03 DIAGNOSIS — M6281 Muscle weakness (generalized): Secondary | ICD-10-CM

## 2022-10-03 DIAGNOSIS — Z9181 History of falling: Secondary | ICD-10-CM | POA: Diagnosis not present

## 2022-10-03 DIAGNOSIS — G7111 Myotonic muscular dystrophy: Secondary | ICD-10-CM | POA: Diagnosis not present

## 2022-10-03 DIAGNOSIS — R2681 Unsteadiness on feet: Secondary | ICD-10-CM

## 2022-10-03 DIAGNOSIS — R2689 Other abnormalities of gait and mobility: Secondary | ICD-10-CM | POA: Diagnosis not present

## 2022-10-03 DIAGNOSIS — R29898 Other symptoms and signs involving the musculoskeletal system: Secondary | ICD-10-CM | POA: Diagnosis not present

## 2022-10-03 NOTE — Therapy (Signed)
OUTPATIENT PHYSICAL THERAPY NEURO TREATMENT    Patient Name: Cynthia Underwood MRN: [id number] DOB:1970-12-11, 52 y.o., female Today's Date: 10/03/2022   PCP: Charlott Rakes, MD REFERRING PROVIDER: Alda Berthold, DO  END OF SESSION:  PT End of Session - 10/03/22 1231     Visit Number 4    Number of Visits 13   Plus eval   Date for PT Re-Evaluation 11/06/22   Due to delay in scheduling   Authorization Type [employer] Medicare    Progress Note Due on Visit 10    PT Start Time 1130    PT Stop Time 1215    PT Time Calculation (min) 45 min    Equipment Utilized During Treatment Other (comment)   Bilateral ISO braces   Activity Tolerance Patient tolerated treatment well    Behavior During Therapy [facility] for tasks assessed/performed             Past Medical History:  Diagnosis Date   Bifascicular block 10/02/2014   Dyspnea    Excess ear wax    Multinodular goiter    Pneumonia    Presence of permanent cardiac pacemaker    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   Sickle cell trait ([place])    Watery eyes    Left   Weakness of both legs    Past Surgical History:  Procedure Laterality Date   BREAST BIOPSY Right    EYE SURGERY Left 2017   "related to blockage in my nose"   EYE SURGERY Right 05/2019   INSERT / REPLACE / REMOVE PACEMAKER  02/15/2017   PACEMAKER IMPLANT N/A 02/15/2017   Procedure: Pacemaker Implant;  Surgeon: Deboraha Sprang, MD;  Location: [place] [facility];  Service: Cardiovascular;  Laterality: N/A;   THYROIDECTOMY N/A 04/21/2021   Procedure: TOTAL THYROIDECTOMY;  Surgeon: Ralene Ok, MD;  Location: [place];  Service: General;  Laterality: N/A;   Patient Active Problem List   Diagnosis Date Noted   Hypothyroidism 11/07/2021   Thyroid nodule 04/21/2021   S/P total thyroidectomy 04/21/2021   Sinus node dysfunction ([place]) 03/10/2020   Weakness of both legs    Watery eyes    Sickle cell trait ([place])    Presence of  permanent cardiac pacemaker    Excess ear wax    Depression    Anemia    Cardiac pacemaker in situ 03/01/2017   Mobitz type 2 second degree heart block 02/15/2017   Symptomatic advanced heart block 02/12/2017   SOB (shortness of breath) 10/02/2014   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   Bifascicular block 10/02/2014   Infection due to trichomonas vaginalis 09/15/2014   Myotonic dystrophy, 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  Felt really good after last pool session.    Pt accompanied by: self  PERTINENT HISTORY: Starting around the age of 35, she started having lock jaw and stiffness of the hands.  Over the years, she developed worsening stiffness of her fingers and hands.  Because her maternal aunt, who is also a patient of mine, had known myotonic dystrophy, she ultimately was genetically tested at the age of 89 which confirmed the diagnosis of myotonic dystrophy type 1. She has a strong family history of DM1 including maternal aunt, cousins x 2, and younger sister.  She does not have any children and has no future plans for pregnancy.   Symptoms were relatively stable until her mid-30s and then started developing fatigue, weakness, daytime sleepiness, and shortness of breath with exertion.  She walks independently but was told previously told to use leg braces.  Over the past few years, she noticed  intermittent difficulty swallowing liquids. She underwent barium swallow which showed signs of aspiration and she was recommended to use a straw and use chin tuck position.     She was seeing several neurologists over the years at [facility] in [place].  She is not working and has been on disability since 2010.  PAIN:  Are you having pain? No Pt reports frequently having pain in legs, worse during day   PRECAUTIONS: Fall and ICD/Pacemaker  WEIGHT BEARING RESTRICTIONS: No  FALLS: Has patient fallen in last 6 months? Yes. Number of falls at least 1  LIVING ENVIRONMENT: Lives with: lives with their family Lives in: House/apartment on 2nd floor Stairs: Yes: Internal: 14 steps; on left going up Has following equipment at home: Quad cane large base, Environmental consultant - 2 wheeled, and Environmental consultant - 4 wheeled  PLOF: Independent with basic ADLs and uses AD occasionally   PATIENT GOALS: "My legs getting stronger without the pain"  OBJECTIVE:   COGNITION: Overall cognitive status: Difficulty to assess due to: no family present   SENSATION: Pt denies numbness/tingling in [place]: hypotonic bilaterally   POSTURE: rounded shoulders   LOWER EXTREMITY MMT:  tested in seated position   MMT Right Eval Left Eval  Hip flexion 3- 3  Hip extension    Hip abduction 3+ 3+  Hip adduction 4- 4-  Hip internal rotation    Hip external rotation    Knee flexion 3+ 3  Knee extension 3+ 3  Ankle dorsiflexion 3 3  Ankle plantarflexion    Ankle inversion    Ankle eversion    (Blank rows = not tested)    TODAY'S TREATMENT:        Patient seen for aquatic therapy today.  Treatment took place in water 3.6-4.0 feet deep depending upon activity.  Pt entered and exited the pool via stairs using B rails in alternating fashion.    In approx 4' dept, had pt ambulate approx 16' x 2 laps forwards without support, marching forwards with alt UE flex x 2 laps with small dumbbells, backwards x  2 laps (same distance), side stepping with squat and moving arms from abd (when legs in abd) to add when legs adduct with small dumbbells.  Min/guard to min a at times.  Pt needs increased time to complete marching task today with cues to stay on task and for adequate motions with arms/legs.     NMR:  Standing near side of pool for  UE support as needed but had her hold yellow dumbbells today for less support; kept RLE in stance moving LLE into hip flex (with knee in ext) x 10 reps, then LLE into ext x 10 reps (again knee in ext).  Transitioned to RLE moving into flexion as above x 10 reps, extension x 10 reps.   Also did R and LLE abduction (knee in ext) x 10 reps.    For core strength and stabilization: Sitting in "saddle position" on yellow noodle maintaining balance without UE/LEs>LAQs x 10 reps without UE support, Bicycle motions x 20 reps without UE support.  Use of small paddle board with arms extended, kicking LEs in alt pattern x 4 laps with PT providing cues and assist at core for improved activation.      Utilized small bottom step in pool with single UE support performing forward step ups with opposite LE march x 10 reps on each side.  Transitioned from single UE support to no UE support with intermittent min/guard needed for mild LOB.      Pt requires buoyancy of water for support for reduced fall risk, viscosity of water is needed for resistance for strengthening and current of water provides perturbations for challenge for balance training.                                                                                                                              PATIENT EDUCATION: Education details: reasoning for exercises in pool, schedule going forward  Person educated: Patient Education method: Explanation, Demonstration, Tactile cues, and Verbal cues Education comprehension: needs further education  HOME EXERCISE PROGRAM: To be established   GOALS: Goals reviewed with  patient? Yes  SHORT TERM GOALS: Target date: 10/10/2022    Floor transfer to be assessed and LTG written  Baseline: Goal status: INITIAL  2.  Pt will improve 5 x STS to less than or equal to 20 seconds w/BUE support to demonstrate improved functional strength and transfer efficiency.   Baseline: 23.34s w/BUE support  Goal status: INITIAL  3.  Pt will bring in AFOs for gait training in clinic  Baseline:  Goal status: INITIAL  4.  Pt will be independent with initial land HEP for improved strength, balance, transfers and gait.  Baseline: not established on eval  Goal status: INITIAL   LONG TERM GOALS: Target date: 11/07/2022   Floor transfer goal  Baseline:  Goal status: INITIAL  2.  Pt will be independent with final land and aquatic HEP for improved strength, balance, transfers and gait.  Baseline:  Goal status: INITIAL  3.  Pt will improve gait velocity to at least 3.0 ft/s w/LRAD mod I for improved gait efficiency and independence  Baseline: 2.75 ft/s without AD Goal status: INITIAL  4.  Pt will improve 5 x STS to less than or equal to 16 seconds w/BUE support to demonstrate improved functional strength and transfer efficiency.   Baseline: 23.34s w/BUE  support Goal status: INITIAL   ASSESSMENT:  CLINICAL IMPRESSION: Session done in pool today and utilized buoyancy to reduce fall risk and viscosity of water for resistance and strength training.  Emphasized balance and strength exercises with decreasing UE support.   Pt tolerated well, needing only min/guard for intermittent LOB. Continue with POC.     OBJECTIVE IMPAIRMENTS: Abnormal gait, decreased activity tolerance, decreased balance, decreased cognition, decreased coordination, decreased endurance, decreased knowledge of condition, decreased knowledge of use of DME, decreased mobility, difficulty walking, decreased strength, decreased safety awareness, increased muscle spasms, impaired tone, postural dysfunction,  and pain.   ACTIVITY LIMITATIONS: carrying, lifting, standing, squatting, sleeping, stairs, locomotion level, and caring for others  PARTICIPATION LIMITATIONS: laundry, driving, shopping, community activity, occupation, and yard work  PERSONAL FACTORS: Behavior pattern, Education, Fitness, Past/current experiences, Transportation, and 1 comorbidity: myotonic dystrophy type 1  are also affecting patient's functional outcome.   REHAB POTENTIAL: Fair due to poor compliance w/therapy in past and lack of social support  CLINICAL DECISION MAKING: Evolving/moderate complexity  EVALUATION COMPLEXITY: Moderate  PLAN:  PT FREQUENCY: 2x/week (1x on land and 1x aquatic)   PT DURATION: 6 weeks  PLANNED INTERVENTIONS: Therapeutic exercises, Therapeutic activity, Neuromuscular re-education, Balance training, Gait training, Patient/Family education, Self Care, Joint mobilization, Stair training, Vestibular training, Canalith repositioning, Orthotic/Fit training, DME instructions, Aquatic Therapy, Dry Needling, Manual therapy, and Re-evaluation  PLAN FOR NEXT SESSION: How did she tolerate the pool? Did she bring AFOs? HEP for BLE strength, walking program, assess floor transfer    Cameron Sprang, PT, MPT [facility] [address] [place], [place], [address] Phone: [phone]   Fax:  [phone] 10/03/22, 12:32 PM

## 2022-10-04 ENCOUNTER — Other Ambulatory Visit: Payer: Self-pay | Admitting: Family Medicine

## 2022-10-04 ENCOUNTER — Encounter: Payer: Self-pay | Admitting: Family Medicine

## 2022-10-04 ENCOUNTER — Ambulatory Visit: Payer: 59 | Attending: Family Medicine | Admitting: Family Medicine

## 2022-10-04 VITALS — BP 106/72 | HR 76 | Temp 98.0°F | Ht 63.0 in | Wt 103.8 lb

## 2022-10-04 DIAGNOSIS — I441 Atrioventricular block, second degree: Secondary | ICD-10-CM

## 2022-10-04 DIAGNOSIS — K649 Unspecified hemorrhoids: Secondary | ICD-10-CM

## 2022-10-04 DIAGNOSIS — G7111 Myotonic muscular dystrophy: Secondary | ICD-10-CM

## 2022-10-04 DIAGNOSIS — Z1231 Encounter for screening mammogram for malignant neoplasm of breast: Secondary | ICD-10-CM | POA: Diagnosis not present

## 2022-10-04 DIAGNOSIS — E890 Postprocedural hypothyroidism: Secondary | ICD-10-CM | POA: Diagnosis not present

## 2022-10-04 DIAGNOSIS — K635 Polyp of colon: Secondary | ICD-10-CM

## 2022-10-04 MED ORDER — HYDROCORT-PRAMOXINE (PERIANAL) 2.5-1 % EX CREA: 1.0000 | TOPICAL_CREAM | Freq: Three times a day (TID) | CUTANEOUS | 3 refills | Status: DC

## 2022-10-04 NOTE — Patient Instructions (Signed)

## 2022-10-04 NOTE — Progress Notes (Signed)
Subjective:  Patient ID: Cynthia Underwood, female    DOB: Aug 19, 1971  Age: 52 y.o. MRN: [id number]  CC: Hypothyroidism   HPI Cynthia Underwood is a 52 y.o. year old female with a history of myotonic dystrophy complicated by cardiac conduction abnormalities including second-degree heart s/p pacemaker placement, postop hypothyroidism status post subtotal thyroidectomy (for Goiter) who presents today for a follow-up visit   Interval History:  While she was in [place] for 2.5 months she noticed rectal bleeding which was bright red and she used Preparation H wipes. This happened on one occasion in 07/2022 with no relief. She states she has a history of hemorrhoids and endorses intermittent constipation for which she has to use MiraLAX.  She is on a 3-year recall for colonoscopy as last colonoscopy from 10/2019 revealed a 10 mm polyp, 1 to 2 mm polyps and pathology revealed: Surgical [P], colon, transverse and ascending, polyp (3) - TUBULAR ADENOMA (4 OF 6 FRAGMENTS) - BENIGN COLONIC MUCOSA (2 OF 6 FRAGMENTS) - NO HIGH GRADE DYSPLASIA OR MALIGNANCY IDENTIFIED   She endorses adherence with her levothyroxine. Last visit with A-fib clinic clinic and her neurologist were in 05/2022.  She was referred to PT by neurology which she has been undergoing. At her last visit she had complained of insomnia, weight loss and Remeron was prescribed but she states she is no longer needs it. Past Medical History:  Diagnosis Date   Bifascicular block 10/02/2014   Dyspnea    Excess ear wax    Multinodular goiter    Pneumonia    Presence of permanent cardiac pacemaker    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   Sickle cell trait ([place])    Watery eyes    Left   Weakness of both legs     Past Surgical History:  Procedure Laterality Date   BREAST BIOPSY Right    EYE SURGERY Left 2017   "related to blockage in my nose"   EYE SURGERY Right 05/2019   INSERT / REPLACE  / REMOVE PACEMAKER  02/15/2017   PACEMAKER IMPLANT N/A 02/15/2017   Procedure: Pacemaker Implant;  Surgeon: Deboraha Sprang, MD;  Location: [place] [facility];  Service: Cardiovascular;  Laterality: N/A;   THYROIDECTOMY N/A 04/21/2021   Procedure: TOTAL THYROIDECTOMY;  Surgeon: Ralene Ok, MD;  Location: [place];  Service: General;  Laterality: N/A;    Family History  Problem Relation Age of Onset   Cancer Father 66       Deceased   Heart disease Father    COPD Mother    Diabetes Maternal Uncle    Heart disease Maternal Uncle    Heart disease Paternal Grandmother    Diabetes Paternal Grandmother    Hypertension Paternal Grandmother    Neuromuscular disorder Maternal Aunt        Myotonic dystrophy   Neuromuscular disorder Cousin        Myotonic dystrophy   Neuromuscular disorder Sister        Myotonic dystrophy   Colon cancer Neg Hx    Colon polyps Neg Hx    Esophageal cancer Neg Hx    Rectal cancer Neg Hx    Stomach cancer Neg Hx     Social History   Socioeconomic History   Marital status: Single    Spouse name: Not on file   Number of children: 0   Years of education: 1.5 Collge   Highest education level: Not on file  Occupational History   Occupation: Merchandiser, retail: [employer]  Tobacco Use   Smoking status: Former    Packs/day: 0.00    Years: 28.00    Total pack years: 0.00    Types: Cigarettes    Quit date: 10/04/2016    Years since quitting: 6.0   Smokeless tobacco: Never   Tobacco comments:    smoking is intermittent - not ready to quit  Vaping Use   Vaping Use: Never used  Substance and Sexual Activity   Alcohol use: Not Currently    Alcohol/week: 0.0 standard drinks of alcohol    Comment:  few times a year   Drug use: Not Currently    Types: Marijuana    Comment: occ    Sexual activity: Yes    Birth control/protection: None, Condom  Other Topics Concern   Not on file  Social History Narrative   Patient lives at home with her aunt.    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  She moved to [facility] in August 2015.   She has been on disability since 2010.   Education: 1 1/2 years of college.   Caffeine - coke 2 cans/day   Exercise - no   Social Determinants of Radio broadcast assistant Strain: Not on file  Food Insecurity: Not on file  Transportation Needs: Not on file  Physical Activity: Not on file  Stress: Not on file  Social Connections: Not on file    Allergies  Allergen Reactions   Penicillin G Other (See Comments), Rash and Nausea And Vomiting    Sore throat   Penicillins Nausea And Vomiting, Rash and Other (See Comments)    Fever Has patient had a PCN reaction causing immediate rash, facial/tongue/throat swelling, SOB or lightheadedness with hypotension: Yes Has patient had a PCN reaction causing severe rash involving mucus membranes or skin necrosis: Unknown Has patient had a PCN reaction that required hospitalization: No Has patient had a PCN reaction occurring within the last 10 years: No If all of the above answers are "NO", then may proceed with Cephalosporin use.     Outpatient Medications Prior to Visit  Medication Sig Dispense Refill   levothyroxine (SYNTHROID) 100 MCG tablet TAKE 1 TABLET BY MOUTH EVERY DAY BEFORE BREAKFAST 30 tablet 0   albuterol (VENTOLIN HFA) 108 (90 Base) MCG/ACT inhaler INHALE 2 PUFFS INTO THE LUNGS EVERY 4 HOURS AS NEEDED FOR WHEEZING OR SHORTNESS OF BREATH (Patient not taking: Reported on 05/19/2022) 18 g 1   hydrocortisone (ANUSOL-HC) 2.5 % rectal cream Place 1 application rectally 2 (two) times daily as needed for hemorrhoids or anal itching. (Patient not taking: Reported on 05/19/2022) 30 g 1   mirtazapine (REMERON SOL-TAB) 30 MG disintegrating tablet TAKE 1 TABLET BY MOUTH AT BEDTIME. (Patient not taking: Reported on 10/04/2022) 90 tablet 0   potassium chloride (KLOR-CON) 10 MEQ tablet TAKE 1 TABLET BY MOUTH EVERY DAY (Patient not taking: Reported on  05/19/2022) 30 tablet 1   tiZANidine (ZANAFLEX) 4 MG tablet Take 1 tablet (4 mg total) by mouth every 8 (eight) hours as needed for muscle spasms. (Patient not taking: Reported on 09/18/2022) 60 tablet 1   No facility-administered medications prior to visit.     ROS Review of Systems  Constitutional:  Negative for activity change and appetite change.  HENT:  Negative for sinus pressure and sore throat.   Respiratory:  Negative for chest tightness, shortness of breath and wheezing.  Cardiovascular:  Negative for chest pain and palpitations.  Gastrointestinal:  Positive for blood in stool and constipation. Negative for abdominal distention and abdominal pain.  Genitourinary: Negative.   Musculoskeletal: Negative.   Psychiatric/Behavioral:  Negative for behavioral problems and dysphoric mood.     Objective:  BP 106/72   Pulse 76   Temp 98 F (36.7 C) (Oral)   Ht '5\' 3"'$  (1.6 m)   Wt 103 lb 12.8 oz (47.1 kg)   LMP 12/03/2016 (Approximate)   SpO2 99%   BMI 18.39 kg/m      10/04/2022    3:48 PM 05/19/2022   10:21 AM 01/11/2022    3:17 PM  BP/Weight  Systolic BP 916  99  Diastolic BP 72  64  Wt. (Lbs) 103.8 104 96.6  BMI 18.39 kg/m2 18.42 kg/m2 17.11 kg/m2      Physical Exam Constitutional:      Appearance: She is well-developed.  Cardiovascular:     Rate and Rhythm: Normal rate.     Heart sounds: Normal heart sounds. No murmur heard. Pulmonary:     Effort: Pulmonary effort is normal.     Breath sounds: Normal breath sounds. No wheezing or rales.  Chest:     Chest wall: No tenderness.  Abdominal:     General: Bowel sounds are normal. There is no distension.     Palpations: Abdomen is soft. There is no mass.     Tenderness: There is no abdominal tenderness.  Musculoskeletal:        General: Normal range of motion.     Right lower leg: No edema.     Left lower leg: No edema.  Neurological:     Mental Status: She is alert and oriented to person, place, and time.   Psychiatric:        Mood and Affect: Mood normal.        Latest Ref Rng & Units 01/11/2022    4:13 PM 04/22/2021    2:28 AM 03/22/2021   11:40 AM  CMP  Glucose 70 - 99 mg/dL 80  137  105   BUN 6 - 24 mg/dL '11  5  17   '$ Creatinine 0.57 - 1.00 mg/dL 0.59  0.42  0.64   Sodium 134 - 144 mmol/L 146  141  143   Potassium 3.5 - 5.2 mmol/L 3.3  3.5  4.1   Chloride 96 - 106 mmol/L 107  106  108   CO2 20 - 29 mmol/L '24  28  27   '$ Calcium 8.7 - 10.2 mg/dL 9.5  8.7  9.8   Total Protein 6.0 - 8.5 g/dL 6.8     Total Bilirubin 0.0 - 1.2 mg/dL 0.3     Alkaline Phos 44 - 121 IU/L 46     AST 0 - 40 IU/L 22     ALT 0 - 32 IU/L 13       Lipid Panel     Component Value Date/Time   CHOL 214 (H) 01/10/2018 0847   TRIG 113 01/10/2018 0847   HDL 57 01/10/2018 0847   CHOLHDL 3.8 01/10/2018 0847   CHOLHDL 3.5 08/26/2014 1516   VLDL 20 08/26/2014 1516   LDLCALC 134 (H) 01/10/2018 0847    CBC    Component Value Date/Time   WBC 4.2 01/11/2022 1613   WBC 7.7 03/22/2021 1140   RBC 3.93 01/11/2022 1613   RBC 4.17 03/22/2021 1140   HGB 12.9 01/11/2022 1613   HCT 37.5 01/11/2022 1613  PLT 179 01/11/2022 1613   MCV 95 01/11/2022 1613   MCH 32.8 01/11/2022 1613   MCH 29.7 03/22/2021 1140   MCHC 34.4 01/11/2022 1613   MCHC 30.5 03/22/2021 1140   RDW 13.2 01/11/2022 1613   LYMPHSABS 2.5 01/11/2022 1613   MONOABS 0.6 08/27/2018 2308   EOSABS 0.1 01/11/2022 1613   BASOSABS 0.0 01/11/2022 1613    Lab Results  Component Value Date   HGBA1C 5.7 01/09/2017    Lab Results  Component Value Date   TSH 0.013 (L) 01/11/2022    Assessment & Plan:  1. Sessile colonic polyp Due for repeat colonoscopy - Ambulatory referral to Gastroenterology  2. Postoperative hypothyroidism Last TSH was suppressed Will send off thyroid panel and adjust levothyroxine dose accordingly - T4, free - TSH - T3  3. Hemorrhoids, unspecified hemorrhoid type Could explain bright red blood per rectum Counseled on  increasing fiber intake, fruits and vegetable, limit intake of foods like cheese, white bread, white rice - CMP14+EGFR - CBC with Differential/Platelet - hydrocortisone-pramoxine (ANALPRAM HC) 2.5-1 % rectal cream; Place 1 Application rectally 3 (three) times daily.  Dispense: 30 g; Refill: 3  4. Encounter for screening mammogram for malignant neoplasm of breast - MM 3D SCREEN BREAST BILATERAL; Future  5.  Myotonic dystrophy Undergoing PT Continue to follow-up with neurology  6.  Second-degree heart block Status post pacemaker placement Follow-up with cardiology  Meds ordered this encounter  Medications   hydrocortisone-pramoxine (ANALPRAM HC) 2.5-1 % rectal cream    Sig: Place 1 Application rectally 3 (three) times daily.    Dispense:  30 g    Refill:  3    Follow-up: Return in about 1 month (around 11/02/2022) for Pap smear.       Charlott Rakes, MD, FAAFP. [facility], [place]   10/04/2022, 6:12 PM

## 2022-10-04 NOTE — Progress Notes (Signed)
Rectal bleeding in November.

## 2022-10-05 ENCOUNTER — Ambulatory Visit: Payer: 59 | Admitting: Physical Therapy

## 2022-10-05 ENCOUNTER — Encounter: Payer: Self-pay | Admitting: Gastroenterology

## 2022-10-05 MED ORDER — HYDROCORTISONE (PERIANAL) 2.5 % EX CREA: 1.0000 | TOPICAL_CREAM | Freq: Two times a day (BID) | CUTANEOUS | 3 refills | Status: DC

## 2022-10-05 NOTE — Telephone Encounter (Signed)
Pharmacy comment: Alternative Requested:MED  NOT COVERED.

## 2022-10-06 ENCOUNTER — Other Ambulatory Visit: Payer: Self-pay | Admitting: Family Medicine

## 2022-10-06 DIAGNOSIS — E890 Postprocedural hypothyroidism: Secondary | ICD-10-CM

## 2022-10-06 MED ORDER — LEVOTHYROXINE SODIUM 112 MCG PO TABS: 112.0000 ug | ORAL_TABLET | Freq: Every day | ORAL | 1 refills | Status: DC

## 2022-10-08 LAB — CMP14+EGFR
ALT: 15 IU/L (ref 0–32)
AST: 26 IU/L (ref 0–40)
Albumin/Globulin Ratio: 2 (ref 1.2–2.2)
Albumin: 4.7 g/dL (ref 3.8–4.9)
Alkaline Phosphatase: 42 IU/L — ABNORMAL LOW (ref 44–121)
BUN/Creatinine Ratio: 20 (ref 9–23)
BUN: 12 mg/dL (ref 6–24)
Bilirubin Total: 0.5 mg/dL (ref 0.0–1.2)
CO2: 25 mmol/L (ref 20–29)
Calcium: 9.4 mg/dL (ref 8.7–10.2)
Chloride: 105 mmol/L (ref 96–106)
Creatinine, Ser: 0.61 mg/dL (ref 0.57–1.00)
Globulin, Total: 2.4 g/dL (ref 1.5–4.5)
Glucose: 77 mg/dL (ref 70–99)
Potassium: 3.7 mmol/L (ref 3.5–5.2)
Sodium: 143 mmol/L (ref 134–144)
Total Protein: 7.1 g/dL (ref 6.0–8.5)
eGFR: 107 mL/min/{1.73_m2} (ref >59)

## 2022-10-08 LAB — CBC WITH DIFFERENTIAL/PLATELET
Basophils Absolute: 0 10*3/uL (ref 0.0–0.2)
Basos: 0 %
EOS (ABSOLUTE): 0.1 10*3/uL (ref 0.0–0.4)
Eos: 1 %
Hematocrit: 37.6 % (ref 34.0–46.6)
Hemoglobin: 12.6 g/dL (ref 11.1–15.9)
Immature Grans (Abs): 0 10*3/uL (ref 0.0–0.1)
Immature Granulocytes: 0 %
Lymphocytes Absolute: 2.1 10*3/uL (ref 0.7–3.1)
Lymphs: 38 %
MCH: 31.6 pg (ref 26.6–33.0)
MCHC: 33.5 g/dL (ref 31.5–35.7)
MCV: 94 fL (ref 79–97)
Monocytes Absolute: 0.2 10*3/uL (ref 0.1–0.9)
Monocytes: 4 %
Neutrophils Absolute: 3.1 10*3/uL (ref 1.4–7.0)
Neutrophils: 57 %
Platelets: 192 10*3/uL (ref 150–450)
RBC: 3.99 x10E6/uL (ref 3.77–5.28)
RDW: 15.1 % (ref 11.7–15.4)
WBC: 5.5 10*3/uL (ref 3.4–10.8)

## 2022-10-08 LAB — T3: T3, Total: 66 ng/dL — ABNORMAL LOW (ref 71–180)

## 2022-10-08 LAB — TSH: TSH: 19.4 u[IU]/mL — ABNORMAL HIGH (ref 0.450–4.500)

## 2022-10-08 LAB — T4, FREE: Free T4: 1.08 ng/dL (ref 0.82–1.77)

## 2022-10-10 ENCOUNTER — Ambulatory Visit: Payer: 59 | Admitting: Physical Therapy

## 2022-10-12 ENCOUNTER — Other Ambulatory Visit: Payer: Self-pay | Admitting: Family Medicine

## 2022-10-12 ENCOUNTER — Encounter: Payer: Self-pay | Admitting: Occupational Therapy

## 2022-10-12 ENCOUNTER — Ambulatory Visit: Payer: 59 | Admitting: Occupational Therapy

## 2022-10-12 ENCOUNTER — Ambulatory Visit: Payer: 59 | Attending: Family Medicine | Admitting: Physical Therapy

## 2022-10-12 DIAGNOSIS — R2689 Other abnormalities of gait and mobility: Secondary | ICD-10-CM | POA: Diagnosis not present

## 2022-10-12 DIAGNOSIS — M6281 Muscle weakness (generalized): Secondary | ICD-10-CM | POA: Insufficient documentation

## 2022-10-12 DIAGNOSIS — R29898 Other symptoms and signs involving the musculoskeletal system: Secondary | ICD-10-CM | POA: Insufficient documentation

## 2022-10-12 DIAGNOSIS — Z9181 History of falling: Secondary | ICD-10-CM | POA: Diagnosis not present

## 2022-10-12 DIAGNOSIS — R2681 Unsteadiness on feet: Secondary | ICD-10-CM | POA: Insufficient documentation

## 2022-10-12 NOTE — Therapy (Signed)
OUTPATIENT OCCUPATIONAL THERAPY NEURO TREATMENT  Patient Name: Cynthia Underwood MRN: [id number] DOB:03-22-1971, 52 y.o., female Today's Date: 10/12/2022  PCP: Charlott Rakes, MD REFERRING PROVIDER: Alda Berthold, DO  END OF SESSION:  OT End of Session - 10/12/22 1016     Visit Number 2    Number of Visits 17   pt may only need 8 visits if coming 1x/wk, up to 17 visits if coming 2x/wk   Authorization Type UHC MCR primary, MCD secondary    Progress Note Due on Visit 10    OT Start Time 1018    OT Stop Time 1100    OT Time Calculation (min) 42 min    Activity Tolerance Patient tolerated treatment well    Behavior During Therapy [facility] for tasks assessed/performed             Past Medical History:  Diagnosis Date   Bifascicular block 10/02/2014   Dyspnea    Excess ear wax    Multinodular goiter    Pneumonia    Presence of permanent cardiac pacemaker    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   Sickle cell trait ([place])    Watery eyes    Left   Weakness of both legs    Past Surgical History:  Procedure Laterality Date   BREAST BIOPSY Right    EYE SURGERY Left 2017   "related to blockage in my nose"   EYE SURGERY Right 05/2019   INSERT / REPLACE / REMOVE PACEMAKER  02/15/2017   PACEMAKER IMPLANT N/A 02/15/2017   Procedure: Pacemaker Implant;  Surgeon: Deboraha Sprang, MD;  Location: [place] [facility];  Service: Cardiovascular;  Laterality: N/A;   THYROIDECTOMY N/A 04/21/2021   Procedure: TOTAL THYROIDECTOMY;  Surgeon: Ralene Ok, MD;  Location: [place];  Service: General;  Laterality: N/A;   Patient Active Problem List   Diagnosis Date Noted   Hypothyroidism 11/07/2021   Thyroid nodule 04/21/2021   S/P total thyroidectomy 04/21/2021   Sinus node dysfunction ([place]) 03/10/2020   Weakness of both legs    Watery eyes    Sickle cell trait ([place])    Presence of permanent cardiac pacemaker    Excess ear wax    Depression     Anemia    Cardiac pacemaker in situ 03/01/2017   Mobitz type 2 second degree heart block 02/15/2017   Symptomatic advanced heart block 02/12/2017   SOB (shortness of breath) 10/02/2014   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   Bifascicular block 10/02/2014   Infection due to trichomonas vaginalis 09/15/2014   Myotonic dystrophy, 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   Pt accompanied by: self  PERTINENT HISTORY: right-handed [place] American female with myotonic dystrophy type I complicated by cardiac arryhtmia s/p PPM (June 2018), and hyperlipidemia   PRECAUTIONS: ICD/Pacemaker, NO heavy lifting  WEIGHT BEARING RESTRICTIONS: No  PAIN:  Are you having pain? No  FALLS: Has patient fallen in last 6 months? Yes. Number of falls 1  LIVING ENVIRONMENT: Lives with: lives with their family Lives in:  house, 2 story with level entry Has following equipment at home:  AFO's  PLOF: Independent with basic ADLs  PATIENT GOALS: increase strength in arms and hands  OBJECTIVE: (from evaluation unless otherwise noted)  HAND DOMINANCE: Right  ADLs: Overall ADLs: overall mod I Transfers/ambulation related to ADLs: Eating: mod I - difficulty cutting food Grooming: mod I  UB Dressing: mod I  LB Dressing: mod I  - leaves shoes untied Toileting: independent Bathing: independent Tub Shower transfers: step over tub/shower combo - feels unsteady, takes her time Equipment: none **pt reports difficulty opening containers, soda  bottles  IADLs: Shopping: cousin goes normally for pt Light housekeeping: light housework (washing dishes, takes garbage out, laundry)  Meal Prep: pt does not cook, but does snacks, sandwiches, heats up things in microwave or rarely heats up things on stove Community mobility: family or medical transport for transportation Medication management: forget the pm medicine  Handwriting: 100% legible  MOBILITY STATUS: Independent   UPPER EXTREMITY ROM:  BUE AROM WFL's but very weak   UPPER EXTREMITY MMT:   BUE grossly 3/5   HAND FUNCTION: Grip strength: Right: 21.3 lbs; Left: 20.9 lbs Lateral pinch: Rt = 4, Lt = 6 lbs 3 tip pinch: Rt = 5, Lt = 6  COORDINATION: 9 Hole Peg test: Right: 25.37 sec; Left: 29.66 sec  SENSATION: WFL  EDEMA: none  MUSCLE TONE: RUE: Hypotonic and LUE: Hypotonic  COGNITION: Overall cognitive status: Within functional limits for tasks assessed  VISION: Subjective report: ? Cataracts with surgery Baseline vision: Wears glasses all the time Visual history: cataracts  OBSERVATIONS: Pt reports increased decline in the last several months   TODAY'S TREATMENT:                                                                                                                               - Therapeutic exercises completed for duration as noted below including:  OT initiated yellow theraputty exercises (search, grip, pinch, 3 point pinch, and key grip) as noted in patient instructions for coordination and strength OT initiated dowel AAROM HEP including shoulder flex, ER, shoulder horizontal abduction and adduction, elbow flex and ext, wrist flex and ext, flip flops, and chest press for improved ROM of affected extremity. Pt able to return demonstration of each exercise x10 each. Handout provided.  PATIENT EDUCATION: Education details: Dowel and yellow putty HEPs B Person educated: Patient Education method: Explanation, Demonstration, and Handouts Education  comprehension: verbalized understanding, returned demonstration, and needs further education  HOME EXERCISE PROGRAM: 2/8: B dowel and yellow putty HEPs   GOALS: Goals reviewed with patient? Yes  SHORT TERM GOALS: Target date: 10/19/22  Independent with initial HEP for BUE's Baseline: Goal status: INITIAL  2.  Pt to verbalize understanding of DME (tub bench) and A/E (jar & bottle openers, etc) that would increase safety, ease, and/or independence with ADLS Baseline:  Goal status: INITIAL  3.  Pt will verbalize strategy for  remembering pm medicine Baseline:  Goal status: INITIAL    LONG TERM GOALS: Target date: 11/17/22  Independent with updated HEP for BUE's Baseline:  Goal status: INITIAL  2.  Improve bilateral grip strength by 5 lbs to increase ease with opening jars/containers Baseline: Rt = 21 lbs, Lt = 20 lbs Goal status: INITIAL  3.  Pt to demo tub/shower transfer safely with tub bench prn Baseline:  Goal status: INITIAL   ASSESSMENT:  CLINICAL IMPRESSION: Pt good candidate for skilled OT services to improve BUE strength as needed for more safe and independent completion of ADLs and IADLs.   PERFORMANCE DEFICITS: in functional skills including ADLs, IADLs, coordination, tone, ROM, strength, pain, muscle spasms, Fine motor control, balance, body mechanics, endurance, cardiopulmonary status limiting function, decreased knowledge of use of DME, and UE functional use.   IMPAIRMENTS: are limiting patient from ADLs, IADLs, leisure, and social participation.   CO-MORBIDITIES: may have co-morbidities  that affects occupational performance. Patient will benefit from skilled OT to address above impairments and improve overall function.  REHAB POTENTIAL: Good  PLAN:  OT FREQUENCY: 1-2x/week  OT DURATION: up to 8 weeks (plus eval)  PLANNED INTERVENTIONS: self care/ADL training, therapeutic exercise, therapeutic activity, neuromuscular re-education, manual therapy,  passive range of motion, functional mobility training, aquatic therapy, moist heat, patient/family education, cognitive remediation/compensation, visual/perceptual remediation/compensation, coping strategies training, and DME and/or AE instructions  RECOMMENDED OTHER SERVICES: none at this time  CONSULTED AND AGREED WITH PLAN OF CARE: Patient  PLAN FOR NEXT SESSION: Review HEP (BUE cane HEP, and yellow putty HEP)   Dennis Bast, OT 10/12/2022, 11:09 AM

## 2022-10-12 NOTE — Patient Instructions (Addendum)
  Putty Exercises All exercises to be completed TWICE daily Comments **Putty has a tendency to spread out to fit its environment. Please keep putty contained when not in use, out of the heat, out of reach of children and animals, and away from clothing/fabric as it may stick.** SEARCH Locate and pinch each individual item out of the putty. These can be COINS, BUTTONS, MARBLES, ETC. By holding putty in opposite hand, use affected hand to search and remove items. You can also do this with putty on table (as pictured). Repeat until all items have been removed from the putty.   USE:  3 items for 1 hand  OR 1-2 items for each hand    GRIP Hold the putty in the palm of your hand. Now grip the putty bending your fingers into the putty as far as you can until they quit moving. REPEAT:  10 times HOLD: until fingers quit moving or about 5 seconds    [place] Roll up some putty to create a small tubular section. Next, pinch the putty and repeat down the section.   i.e. thumb to index finger, thumb to middle finger, thumb to ring finger, and thumb to pinky finger.        REPEAT:  5 times each finger HOLD:  at least 3 seconds   2.  Roll putty into tube on table and pinch between first two fingers and thumb x 10 reps.   KEY GRIP Hold the putty at the top of your hand. Squeeze the putty between your thumb and the side of your 2nd finger as shown.    -Think of holding a JEOPRADY buzzer in your hand  -Aim for the second knuckle of your second finger.  REPEAT:  10 times HOLD:  at least 3 seconds

## 2022-10-12 NOTE — Therapy (Signed)
OUTPATIENT PHYSICAL THERAPY NEURO TREATMENT    Patient Name: Cynthia Underwood MRN: [id number] DOB:[birth year], 52 y.o., female Today's Date: 10/12/2022   PCP: Charlott Rakes, MD REFERRING PROVIDER: Alda Berthold, DO  END OF SESSION:  PT End of Session - 10/12/22 0935     Visit Number 5    Number of Visits 13   Plus eval   Date for PT Re-Evaluation 11/06/22   Due to delay in scheduling   Authorization Type UHC Medicare    Progress Note Due on Visit 10    PT Start Time 0933    PT Stop Time 1016    PT Time Calculation (min) 43 min    Equipment Utilized During Treatment Other (comment)   Bilateral anterior guard AFOs   Activity Tolerance Patient tolerated treatment well    Behavior During Therapy [facility] for tasks assessed/performed              Past Medical History:  Diagnosis Date   Bifascicular block 10/02/2014   Dyspnea    Excess ear wax    Multinodular goiter    Pneumonia    Presence of permanent cardiac pacemaker    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   Sickle cell trait ([place])    Watery eyes    Left   Weakness of both legs    Past Surgical History:  Procedure Laterality Date   BREAST BIOPSY Right    EYE SURGERY Left 2017   "related to blockage in my nose"   EYE SURGERY Right 05/2019   INSERT / REPLACE / REMOVE PACEMAKER  02/15/2017   PACEMAKER IMPLANT N/A 02/15/2017   Procedure: Pacemaker Implant;  Surgeon: Deboraha Sprang, MD;  Location: [place] [facility];  Service: Cardiovascular;  Laterality: N/A;   THYROIDECTOMY N/A 04/21/2021   Procedure: TOTAL THYROIDECTOMY;  Surgeon: Ralene Ok, MD;  Location: [place];  Service: General;  Laterality: N/A;   Patient Active Problem List   Diagnosis Date Noted   Hypothyroidism 11/07/2021   Thyroid nodule 04/21/2021   S/P total thyroidectomy 04/21/2021   Sinus node dysfunction ([place]) 03/10/2020   Weakness of both legs    Watery eyes    Sickle cell trait ([place])     Presence of permanent cardiac pacemaker    Excess ear wax    Depression    Anemia    Cardiac pacemaker in situ 03/01/2017   Mobitz type 2 second degree heart block 02/15/2017   Symptomatic advanced heart block 02/12/2017   SOB (shortness of breath) 10/02/2014   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   Bifascicular block 10/02/2014   Infection due to trichomonas vaginalis 09/15/2014   Myotonic dystrophy, 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 Brought her custom AFOs with her today.    Pt accompanied by: self  PERTINENT HISTORY: Starting around the age of 11, she started having lock jaw and stiffness of the hands.  Over the years, she developed worsening stiffness of her fingers and hands.  Because her maternal aunt, who is also a patient of mine, had known myotonic dystrophy, she ultimately was genetically tested at the age of 32 which confirmed the diagnosis of myotonic dystrophy type 1. She has a strong family history of DM1 including maternal aunt, cousins x 2, and younger sister.  She does not have any children and has no future plans for pregnancy.   Symptoms were relatively stable until her mid-30s and then started developing fatigue, weakness, daytime sleepiness, and shortness of breath with exertion.  She walks independently but was told previously told to use leg braces.  Over the past few years, she noticed  intermittent difficulty swallowing liquids. She underwent barium swallow which showed signs of aspiration and she was recommended to use a straw and use chin tuck position.     She was seeing several neurologists over the years at [facility] in [place].  She is not working and has been on disability since 2010.  PAIN:  Are you having pain? No Pt reports frequently having pain in legs, worse during day   PRECAUTIONS: Fall and ICD/Pacemaker  WEIGHT BEARING RESTRICTIONS: No  FALLS: Has patient fallen in last 6 months? Yes. Number of falls at least 1  LIVING ENVIRONMENT: Lives with: lives with their family Lives in: House/apartment on 2nd floor Stairs: Yes: Internal: 14 steps; on left going up Has following equipment at home: Quad cane large base, Environmental consultant - 2 wheeled, and [employer] - 4 wheeled  PLOF: Independent with basic ADLs and uses AD occasionally   PATIENT GOALS: "My legs getting stronger without the pain"  OBJECTIVE:   COGNITION: Overall cognitive status: Difficulty to assess due to: no family present   SENSATION: Pt denies numbness/tingling in [place]: hypotonic bilaterally   POSTURE: rounded shoulders   LOWER EXTREMITY MMT:  tested in seated position   MMT Right Eval Left Eval  Hip flexion 3- 3  Hip extension    Hip abduction 3+ 3+  Hip adduction 4- 4-  Hip internal rotation    Hip external rotation    Knee flexion 3+ 3  Knee extension 3+ 3  Ankle dorsiflexion 3 3  Ankle plantarflexion    Ankle inversion    Ankle eversion    (Blank rows = not tested)    TODAY'S TREATMENT:     Gait Training  Gait pattern: step through pattern, decreased arm swing- Right, decreased arm swing- Left, decreased stride length, decreased hip/knee flexion- Right, decreased hip/knee flexion- Left, and decreased trunk rotation Distance walked: 115' Assistive device utilized:  Bilateral allard toe off 2.0 AFOs Level of assistance: SBA Comments: Pt  demonstrates very robotic-like gait pattern and has decreased arm swing/trunk rotation vs no AFOs. Suspect current AFOs too rigid for pt and she states she feels as though she is going to lose her balance. Discussed potential to try SpryStep Plus AFOs, but pt's foot too small to fit into sample AFOs in clinic so therapist to attempt to obtain pediatric-size AFOs to trial in clinic.   Ther Act  STG Assessment     [facility] PT Assessment - 10/12/22 1000  Transfers   Five time sit to stand comments  21.72s without UE support              Ther Ex  Established initial HEP (see bolded below) for improved BLE strength, ankle stability and transfers                                                                                                                       PATIENT EDUCATION: Education details: Adding more appointments, goal assessment, initial HEP Person educated: Patient Education method: Explanation, Demonstration, and Handouts Education comprehension: verbalized understanding  HOME EXERCISE PROGRAM: Access Code: [email or web address] URL: [email or web address] Date: 10/12/2022 Prepared by: Mickie Bail Mandalyn [place]  Exercises - Sit to Stand Without Arm Support  - 1 x daily - 7 x weekly - 2 sets - 6-10 reps - Heel Raises with Counter Support  - 1 x daily - 7 x weekly - 3 sets - 10 reps - Toe Walking with Counter Support  - 1 x daily - 7 x weekly - 3 sets - 10 reps - Heel Walking with Counter Support  - 1 x daily - 7 x weekly - 3 sets - 10 reps  GOALS: Goals reviewed with patient? Yes  SHORT TERM GOALS: Target date: 10/10/2022    Floor transfer to be assessed and LTG written  Baseline: Goal status: IN PROGRESS  2.  Pt will improve 5 x STS to less than or equal to 20 seconds w/BUE support to demonstrate improved functional strength and transfer efficiency.   Baseline: 23.34s w/BUE support; 21.72s without UE support  Goal status: PARTIALLY MET  3.  Pt will bring in AFOs for  gait training in clinic  Baseline:  Goal status: MET  4.  Pt will be independent with initial land HEP for improved strength, balance, transfers and gait.  Baseline: established on 2/8  Goal status: IN PROGRESS   LONG TERM GOALS: Target date: 11/07/2022   Floor transfer goal  Baseline:  Goal status: INITIAL  2.  Pt will be independent with final land and aquatic HEP for improved strength, balance, transfers and gait.  Baseline:  Goal status: INITIAL  3.  Pt will improve gait velocity to at least 3.0 ft/s w/LRAD mod I for improved gait efficiency and independence  Baseline: 2.75 ft/s without AD Goal status: INITIAL  4.  Pt will improve 5 x STS to less than or equal to 16 seconds w/BUE support to demonstrate improved functional strength and transfer efficiency.   Baseline: 23.34s w/BUE support Goal status: INITIAL   ASSESSMENT:  CLINICAL IMPRESSION: Emphasis of skilled PT session on gait training w/custom AFOs, STG assessment and establishing initial HEP. Pt has met 1/4, partially met 1/4 and is progressing 2/4 STGs. Pt has improved her 5x STS and was able to perform without any UE support, but fell short of goal time. Pt did bring her custom AFOs to clinic today but are deemed too restrictive for pt to comfortably walk and she feels very off  balance in them, so will pursue less restrictive AFO type. Established initial HEP today for BLE strength. Pt has been unable to practice floor transfer in clinic this date due to therapist illness resulting in cancelled appointments. Continue POC.    OBJECTIVE IMPAIRMENTS: Abnormal gait, decreased activity tolerance, decreased balance, decreased cognition, decreased coordination, decreased endurance, decreased knowledge of condition, decreased knowledge of use of DME, decreased mobility, difficulty walking, decreased strength, decreased safety awareness, increased muscle spasms, impaired tone, postural dysfunction, and pain.   ACTIVITY  LIMITATIONS: carrying, lifting, standing, squatting, sleeping, stairs, locomotion level, and caring for others  PARTICIPATION LIMITATIONS: laundry, driving, shopping, community activity, occupation, and yard work  PERSONAL FACTORS: Behavior pattern, Education, Fitness, Past/current experiences, Transportation, and 1 comorbidity: myotonic dystrophy type 1  are also affecting patient's functional outcome.   REHAB POTENTIAL: Fair due to poor compliance w/therapy in past and lack of social support  CLINICAL DECISION MAKING: Evolving/moderate complexity  EVALUATION COMPLEXITY: Moderate  PLAN:  PT FREQUENCY: 2x/week (1x on land and 1x aquatic)   PT DURATION: 6 weeks  PLANNED INTERVENTIONS: Therapeutic exercises, Therapeutic activity, Neuromuscular re-education, Balance training, Gait training, Patient/Family education, Self Care, Joint mobilization, Stair training, Vestibular training, Canalith repositioning, Orthotic/Fit training, DME instructions, Aquatic Therapy, Dry Needling, Manual therapy, and Re-evaluation  PLAN FOR NEXT SESSION: Did Mickie Bail get pediatric AFOs? How is HEP? Global strength - eccentric heel taps, scifit, floor transfer    Charlett Nose, PT, [place] [address] [place], [place]  [address] Phone:  [phone] Fax:  [phone] 10/12/22, 10:20 AM

## 2022-10-16 ENCOUNTER — Ambulatory Visit: Payer: 59 | Attending: Family Medicine

## 2022-10-16 DIAGNOSIS — Z0000 Encounter for general adult medical examination without abnormal findings: Secondary | ICD-10-CM

## 2022-10-16 MED ORDER — ZOSTER VAC RECOMB ADJUVANTED 50 MCG/0.5ML IM SUSR: 0.5000 mL | Freq: Once | INTRAMUSCULAR | 1 refills | Status: AC

## 2022-10-16 NOTE — Progress Notes (Signed)
Subjective:   Cynthia Underwood is a 52 y.o. female who presents for Medicare Annual (Subsequent) preventive examination.  Review of Systems     I connected with Raynelle Highland on 10/16/2022 at 3:40 pm by telephone and verified that I am speaking with the correct person using two identifiers. I discussed the limitations, risks, security and privacy concerns of performing an evaluation and management service by telephone and the availability of in person appointments. I also discussed with the patient that there may be a patient responsible charge related to this service. The patient expressed understanding and agreed to proceed.   Patient location: Home My Location: [place] on the telephone call: Myself and Patinet     Cardiac Risk Factors include: none     Objective:    There were no vitals filed for this visit. There is no height or weight on file to calculate BMI.     10/16/2022    3:42 PM 09/18/2022    2:00 PM 09/18/2022    1:22 PM 05/19/2022   10:22 AM 07/25/2021    4:03 PM 07/06/2021   10:22 AM 04/21/2021    6:40 AM  Advanced Directives  Does Patient Have a Medical Advance Directive? No Yes Yes Yes No No No  Type of Advance Directive  Healthcare Power of [place]      Would patient like information on creating a medical advance directive? Yes (ED - Information included in AVS)      No - Patient declined    Current Medications (verified) Outpatient Encounter Medications as of 10/16/2022  Medication Sig   levothyroxine (SYNTHROID) 112 MCG tablet Take 1 tablet (112 mcg total) by mouth daily before breakfast.   albuterol (VENTOLIN HFA) 108 (90 Base) MCG/ACT inhaler INHALE 2 PUFFS INTO THE LUNGS EVERY 4 HOURS AS NEEDED FOR WHEEZING OR SHORTNESS OF BREATH (Patient not taking: Reported on 05/19/2022)   hydrocortisone (ANUSOL-HC) 2.5 % rectal cream Place 1 application rectally 2 (two) times daily as needed for hemorrhoids or  anal itching. (Patient not taking: Reported on 05/19/2022)   hydrocortisone (PROCTOZONE-HC) 2.5 % rectal cream Place 1 Application rectally 2 (two) times daily. (Patient not taking: Reported on 10/16/2022)   No facility-administered encounter medications on file as of 10/16/2022.    Allergies (verified) Penicillin g and Penicillins   History: Past Medical History:  Diagnosis Date   Bifascicular block 10/02/2014   Dyspnea    Excess ear wax    Multinodular goiter    Pneumonia    Presence of permanent cardiac pacemaker    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   Sickle cell trait ([place])    Watery eyes    Left   Weakness of both legs    Past Surgical History:  Procedure Laterality Date   BREAST BIOPSY Right    EYE SURGERY Left 2017   "related to blockage in my nose"   EYE SURGERY Right 05/2019   INSERT / REPLACE / REMOVE PACEMAKER  02/15/2017   PACEMAKER IMPLANT N/A 02/15/2017   Procedure: Pacemaker Implant;  Surgeon: Deboraha Sprang, MD;  Location: [facility];  Service: Cardiovascular;  Laterality: N/A;   THYROIDECTOMY N/A 04/21/2021   Procedure: TOTAL THYROIDECTOMY;  Surgeon: Ralene Ok, MD;  Location: [facility] OR;  Service: General;  Laterality: N/A;   Family History  Problem Relation Age of Onset   Cancer Father 75  Deceased   Heart disease Father    COPD Mother    Diabetes Maternal Uncle    Heart disease Maternal Uncle    Heart disease Paternal Grandmother    Diabetes Paternal Grandmother    Hypertension Paternal Grandmother    Neuromuscular disorder Maternal Aunt        Myotonic dystrophy   Neuromuscular disorder Cousin        Myotonic dystrophy   Neuromuscular disorder Sister        Myotonic dystrophy   Colon cancer Neg Hx    Colon polyps Neg Hx    Esophageal cancer Neg Hx    Rectal cancer Neg Hx    Stomach cancer Neg Hx    Social History   Socioeconomic History   Marital status: Single    Spouse name:  Not on file   Number of children: 0   Years of education: 1.5 Collge   Highest education level: Not on file  Occupational History   Occupation: Unemployed    Employer: OTHER  Tobacco Use   Smoking status: Former    Packs/day: 0.00    Years: 28.00    Total pack years: 0.00    Types: Cigarettes    Quit date: 10/04/2016    Years since quitting: 6.0   Smokeless tobacco: Never   Tobacco comments:    smoking is intermittent - not ready to quit  Vaping Use   Vaping Use: Never used  Substance and Sexual Activity   Alcohol use: Not Currently    Alcohol/week: 0.0 standard drinks of alcohol    Comment:  few times a year   Drug use: Not Currently    Types: Marijuana    Comment: occ    Sexual activity: Yes    Birth control/protection: None, Condom  Other Topics Concern   Not on file  Social History Narrative   Patient lives at home with her aunt.   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  She moved to [facility] in August 2015.   She has been on disability since 2010.   Education: 1 1/2 years of college.   Caffeine - coke 2 cans/day   Exercise - no   Social Determinants of Health   Financial Resource Strain: Low Risk  (10/16/2022)   Overall Financial Resource Strain (CARDIA)    Difficulty of Paying Living Expenses: Not hard at all  Food Insecurity: No Food Insecurity (10/16/2022)   Hunger Vital Sign    Worried About Running Out of Food in the Last Year: Never true    Ran Out of Food in the Last Year: Never true  Transportation Needs: No Transportation Needs (10/16/2022)   PRAPARE - Hydrologist (Medical): No    Lack of Transportation (Non-Medical): No  Physical Activity: Inactive (10/16/2022)   Exercise Vital Sign    Days of Exercise per Week: 0 days    Minutes of Exercise per Session: 0 min  Stress: Stress Concern Present (10/16/2022)   [place]    Feeling of Stress  : To some extent  Social Connections: Socially Isolated (10/16/2022)   Social Connection and Isolation Panel [NHANES]    Frequency of Communication with Friends and Family: Three times a week    Frequency of Social Gatherings with Friends and Family: Three times a week    Attends Religious Services: Never    Active Member of Clubs or Organizations: No  Attends Archivist Meetings: Never    Marital Status: Never married    Tobacco Counseling Counseling given: Yes Tobacco comments: smoking is intermittent - not ready to quit   Clinical Intake:  Pre-visit preparation completed: No  Pain : No/denies pain     Nutritional Risks: None Diabetes: No  How often do you need to have someone help you when you read instructions, pamphlets, or other written materials from your doctor or pharmacy?: 1 - Never  Diabetic?No  Interpreter Needed?: No      Activities of Daily Living    10/16/2022    3:46 PM  In your present state of health, do you have any difficulty performing the following activities:  Hearing? 0  Vision? 0  Difficulty concentrating or making decisions? 0  Walking or climbing stairs? 1  Dressing or bathing? 0  Doing errands, shopping? 0  Preparing Food and eating ? Y  Using the Toilet? N  In the past six months, have you accidently leaked urine? N  Do you have problems with loss of bowel control? N  Managing your Medications? N  Managing your Finances? Y  Housekeeping or managing your Housekeeping? N    Patient Care Team: Charlott Rakes, MD as PCP - General (Family Medicine) Sueanne Margarita, MD as PCP - Cardiology (Cardiology) Deboraha Sprang, MD as PCP - Electrophysiology (Cardiology) Alda Berthold, DO as Consulting Physician (Neurology)  Indicate any recent Medical Services you may have received from other than [facility] providers in the past year (date may be approximate).     Assessment:   This is a routine wellness examination for  [place].  Hearing/Vision screen No results found.  Dietary issues and exercise activities discussed: Current Exercise Habits: The patient does not participate in regular exercise at present, Exercise limited by: None identified   Goals Addressed   None    Depression Screen    10/16/2022    3:42 PM 10/04/2022    3:52 PM 01/11/2022    3:18 PM 11/07/2021    2:09 PM 07/25/2021    4:03 PM 07/25/2021    3:16 PM 07/12/2020    4:33 PM  PHQ 2/9 Scores  PHQ - 2 Score 0 1 0 0 0 2 0  PHQ- 9 Score  5 6 8  9 10    $ Fall Risk    10/16/2022    3:42 PM 10/04/2022    3:48 PM 05/19/2022   10:22 AM 01/11/2022    3:18 PM 11/07/2021    2:10 PM  Fall Risk   Falls in the past year? 0 0 0 0 0  Number falls in past yr: 0 0 0 0 0  Injury with Fall? 0 0 0 0 0  Risk for fall due to : No Fall Risks      Follow up Falls prevention discussed        FALL RISK PREVENTION PERTAINING TO THE HOME:  Any stairs in or around the home? Yes  If so, are there any without handrails? No  Home free of loose throw rugs in walkways, pet beds, electrical cords, etc? Yes  Adequate lighting in your home to reduce risk of falls? Yes   ASSISTIVE DEVICES UTILIZED TO PREVENT FALLS:  Life alert? No  Use of a cane, walker or w/c? No  Grab bars in the bathroom? No  Shower chair or bench in shower? No  Elevated toilet seat or a handicapped toilet? No   TIMED UP AND GO:  Was the test performed? No .  Length of time to ambulate 10 feet: n/a sec.   Gait steady and fast without use of assistive device  Cognitive Function:    10/16/2022    3:42 PM 07/25/2021    4:03 PM  MMSE - Mini Mental State Exam  Orientation to time 5 5  Orientation to Place 5 5  Registration 3 3  Attention/ Calculation 5 5  Recall 3 3  Language- name 2 objects 2 2  Language- repeat 1 1  Language- follow 3 step command 3 3  Language- read & follow direction 1 1  Write a sentence 1 1  Copy design 1 1  Total score 30 30        10/16/2022     3:42 PM  6CIT Screen  What Year? 0 points  What month? 0 points  What time? 0 points  Count back from 20 0 points  Months in reverse 0 points  Repeat phrase 0 points  Total Score 0 points    Immunizations Immunization History  Administered Date(s) Administered   Influenza,inj,Quad PF,6+ Mos 08/11/2014, 07/01/2015, 08/04/2019, 07/12/2020, 07/25/2021   PNEUMOCOCCAL CONJUGATE-20 07/25/2021   Pneumococcal Polysaccharide-23 08/04/2019   Tdap 01/02/2018    TDAP status: Up to date  Flu Vaccine status: Due, Education has been provided regarding the importance of this vaccine. Advised may receive this vaccine at local pharmacy or Health Dept. Aware to provide a copy of the vaccination record if obtained from local pharmacy or Health Dept. Verbalized acceptance and understanding.  Pneumococcal vaccine status: Up to date  Covid-19 vaccine status: Declined, Education has been provided regarding the importance of this vaccine but patient still declined. Advised may receive this vaccine at local pharmacy or Health Dept.or vaccine clinic. Aware to provide a copy of the vaccination record if obtained from local pharmacy or Health Dept. Verbalized acceptance and understanding.  Qualifies for Shingles Vaccine? Yes   Zostavax completed No   Shingrix Completed?: No.    Education has been provided regarding the importance of this vaccine. Patient has been advised to call insurance company to determine out of pocket expense if they have not yet received this vaccine. Advised may also receive vaccine at local pharmacy or Health Dept. Verbalized acceptance and understanding.  Screening Tests Health Maintenance  Topic Date Due   Zoster Vaccines- Shingrix (1 of 2) Never done   INFLUENZA VACCINE  04/04/2022   Medicare Annual Wellness (AWV)  07/25/2022   COLONOSCOPY (Pts 45-2yr Insurance coverage will need to be confirmed)  10/06/2022   PAP SMEAR-Modifier  09/14/2022   COVID-19 Vaccine (1) 11/01/2022  (Originally 02/14/1972)   MAMMOGRAM  07/21/2023   DTaP/Tdap/Td (2 - Td or Tdap) 01/03/2028   Hepatitis C Screening  Completed   HIV Screening  Completed   HPV VACCINES  Aged Out    Health Maintenance  Health Maintenance Due  Topic Date Due   Zoster Vaccines- Shingrix (1 of 2) Never done   INFLUENZA VACCINE  04/04/2022   Medicare Annual Wellness (AWV)  07/25/2022   COLONOSCOPY (Pts 45-455yrInsurance coverage will need to be confirmed)  10/06/2022   PAP SMEAR-Modifier  09/14/2022    Colorectal cancer screening: Type of screening: Colonoscopy. Completed 10/07/2019. Repeat every 3 years Has upcoming appointment  Mammogram status: Completed 07/20/2021. Repeat every year Appointment scheduled for 11/03/2022   Lung Cancer Screening: (Low Dose CT Chest recommended if Age 54-80ears, 30 pack-year currently smoking OR have quit w/in 15years.) does not  qualify.   Lung Cancer Screening Referral: No  Additional Screening:  Hepatitis C Screening: does qualify; Completed 06/08/2021  Vision Screening: Recommended annual ophthalmology exams for early detection of glaucoma and other disorders of the eye. Is the patient up to date with their annual eye exam?  Yes  Who is the provider or what is the name of the office in which the patient attends annual eye exams? [facility] If pt is not established with a provider, would they like to be referred to a provider to establish care? No .   Dental Screening: Recommended annual dental exams for proper oral hygiene  Community Resource Referral / Chronic Care Management: CRR required this visit?  No   CCM required this visit?  No      Plan:     I have personally reviewed and noted the following in the patient's chart:   Medical and social history Use of alcohol, tobacco or illicit drugs  Current medications and supplements including opioid prescriptions. Patient is not currently taking opioid prescriptions. Functional ability and  status Nutritional status Physical activity Advanced directives List of other physicians Hospitalizations, surgeries, and ER visits in previous 12 months Vitals Screenings to include cognitive, depression, and falls Referrals and appointments  In addition, I have reviewed and discussed with patient certain preventive protocols, quality metrics, and best practice recommendations. A written personalized care plan for preventive services as well as general preventive health recommendations were provided to patient.     Gomez Cleverly, Progress   10/16/2022   Nurse Notes: I spent 30 minutes on this telephone encounter AVS mailed to patinet

## 2022-10-16 NOTE — Patient Instructions (Signed)
Cynthia Underwood , Thank you for taking time to come for your Medicare Wellness Visit. I appreciate your ongoing commitment to your health goals. Please review the following plan we discussed and let me know if I can assist you in the future.   These are the goals we discussed:  Goals   None     This is a list of the screening recommended for you and due dates:  Health Maintenance  Topic Date Due   Zoster (Shingles) Vaccine (1 of 2) Never done   Flu Shot  04/04/2022   Colon Cancer Screening  10/06/2022   Pap Smear  09/14/2022   COVID-19 Vaccine (1) 11/01/2022*   Mammogram  07/21/2023   Medicare Annual Wellness Visit  10/17/2023   DTaP/Tdap/Td vaccine (2 - Td or Tdap) 01/03/2028   Hepatitis C Screening: USPSTF Recommendation to screen - Ages 66-79 yo.  Completed   HIV Screening  Completed   HPV Vaccine  Aged Out  *Topic was postponed. The date shown is not the original due date.   Health Maintenance, Female Adopting a healthy lifestyle and getting preventive care are important in promoting health and wellness. Ask your health care provider about: The right schedule for you to have regular tests and exams. Things you can do on your own to prevent diseases and keep yourself healthy. What should I know about diet, weight, and exercise? Eat a healthy diet  Eat a diet that includes plenty of vegetables, fruits, low-fat dairy products, and lean protein. Do not eat a lot of foods that are high in solid fats, added sugars, or sodium. Maintain a healthy weight Body mass index (BMI) is used to identify weight problems. It estimates body fat based on height and weight. Your health care provider can help determine your BMI and help you achieve or maintain a healthy weight. Get regular exercise Get regular exercise. This is one of the most important things you can do for your health. Most adults should: Exercise for at least 150 minutes each week. The exercise should increase your heart rate and  make you sweat (moderate-intensity exercise). Do strengthening exercises at least twice a week. This is in addition to the moderate-intensity exercise. Spend less time sitting. Even light physical activity can be beneficial. Watch cholesterol and blood lipids Have your blood tested for lipids and cholesterol at 54 years of age, then have this test every 5 years. Have your cholesterol levels checked more often if: Your lipid or cholesterol levels are high. You are older than 54 years of age. You are at high risk for heart disease. What should I know about cancer screening? Depending on your health history and family history, you may need to have cancer screening at various ages. This may include screening for: Breast cancer. Cervical cancer. Colorectal cancer. Skin cancer. Lung cancer. What should I know about heart disease, diabetes, and high blood pressure? Blood pressure and heart disease High blood pressure causes heart disease and increases the risk of stroke. This is more likely to develop in people who have high blood pressure readings or are overweight. Have your blood pressure checked: Every 3-5 years if you are 82-30 years of age. Every year if you are 31 years old or older. Diabetes Have regular diabetes screenings. This checks your fasting blood sugar level. Have the screening done: Once every three years after age 65 if you are at a normal weight and have a low risk for diabetes. More often and at a younger age  if you are overweight or have a high risk for diabetes. What should I know about preventing infection? Hepatitis B If you have a higher risk for hepatitis B, you should be screened for this virus. Talk with your health care provider to find out if you are at risk for hepatitis B infection. Hepatitis C Testing is recommended for: Everyone born from [birth year] through 1967. Anyone with known risk factors for hepatitis C. Sexually transmitted infections (STIs) Get screened  for STIs, including gonorrhea and chlamydia, if: You are sexually active and are younger than 54 years of age. You are older than 54 years of age and your health care provider tells you that you are at risk for this type of infection. Your sexual activity has changed since you were last screened, and you are at increased risk for chlamydia or gonorrhea. Ask your health care provider if you are at risk. Ask your health care provider about whether you are at high risk for HIV. Your health care provider may recommend a prescription medicine to help prevent HIV infection. If you choose to take medicine to prevent HIV, you should first get tested for HIV. You should then be tested every 3 months for as long as you are taking the medicine. Pregnancy If you are about to stop having your period (premenopausal) and you may become pregnant, seek counseling before you get pregnant. Take 400 to 800 micrograms (mcg) of folic acid every day if you become pregnant. Ask for birth control (contraception) if you want to prevent pregnancy. Osteoporosis and menopause Osteoporosis is a disease in which the bones lose minerals and strength with aging. This can result in bone fractures. If you are 89 years old or older, or if you are at risk for osteoporosis and fractures, ask your health care provider if you should: Be screened for bone loss. Take a calcium or vitamin D supplement to lower your risk of fractures. Be given hormone replacement therapy (HRT) to treat symptoms of menopause. Follow these instructions at home: Alcohol use Do not drink alcohol if: Your health care provider tells you not to drink. You are pregnant, may be pregnant, or are planning to become pregnant. If you drink alcohol: Limit how much you have to: 0-1 drink a day. Know how much alcohol is in your drink. In the [place] one drink equals one 12 oz bottle of beer (355 mL), one 5 oz glass of wine (148 mL), or one 1 oz glass of hard liquor (44  mL). Lifestyle Do not use any products that contain nicotine or tobacco. These products include cigarettes, chewing tobacco, and vaping devices, such as e-cigarettes. If you need help quitting, ask your health care provider. Do not use street drugs. Do not share needles. Ask your health care provider for help if you need support or information about quitting drugs. General instructions Schedule regular health, dental, and eye exams. Stay current with your vaccines. Tell your health care provider if: You often feel depressed. You have ever been abused or do not feel safe at home. Summary Adopting a healthy lifestyle and getting preventive care are important in promoting health and wellness. Follow your health care provider's instructions about healthy diet, exercising, and getting tested or screened for diseases. Follow your health care provider's instructions on monitoring your cholesterol and blood pressure. This information is not intended to replace advice given to you by your health care provider. Make sure you discuss any questions you have with your health care provider. Document  Revised: 01/10/2021 Document Reviewed: 01/10/2021 Elsevier Patient Education  [place].

## 2022-10-17 ENCOUNTER — Encounter: Payer: Self-pay | Admitting: Rehabilitation

## 2022-10-17 ENCOUNTER — Ambulatory Visit: Payer: 59 | Admitting: Rehabilitation

## 2022-10-17 DIAGNOSIS — R2681 Unsteadiness on feet: Secondary | ICD-10-CM | POA: Diagnosis not present

## 2022-10-17 DIAGNOSIS — R29898 Other symptoms and signs involving the musculoskeletal system: Secondary | ICD-10-CM

## 2022-10-17 DIAGNOSIS — Z9181 History of falling: Secondary | ICD-10-CM

## 2022-10-17 DIAGNOSIS — R2689 Other abnormalities of gait and mobility: Secondary | ICD-10-CM

## 2022-10-17 DIAGNOSIS — M6281 Muscle weakness (generalized): Secondary | ICD-10-CM | POA: Diagnosis not present

## 2022-10-17 NOTE — Therapy (Signed)
OUTPATIENT PHYSICAL THERAPY NEURO TREATMENT    Patient Name: Cynthia Underwood MRN: [id number] DOB:[birth year], 52 y.o., female Today's Date: 10/17/2022   PCP: Charlott Rakes, MD REFERRING PROVIDER: Alda Berthold, DO  END OF SESSION:  PT End of Session - 10/17/22 1227     Visit Number 6    Number of Visits 13   Plus eval   Date for PT Re-Evaluation 11/06/22   Due to delay in scheduling   Authorization Type UHC Medicare    Progress Note Due on Visit 10    PT Start Time 1130    PT Stop Time 1215    PT Time Calculation (min) 45 min    Equipment Utilized During Treatment Other (comment)   Bilateral anterior guard AFOs   Activity Tolerance Patient tolerated treatment well    Behavior During Therapy [facility] for tasks assessed/performed             Past Medical History:  Diagnosis Date   Bifascicular block 10/02/2014   Dyspnea    Excess ear wax    Multinodular goiter    Pneumonia    Presence of permanent cardiac pacemaker    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   Sickle cell trait ([place])    Watery eyes    Left   Weakness of both legs    Past Surgical History:  Procedure Laterality Date   BREAST BIOPSY Right    EYE SURGERY Left 2017   "related to blockage in my nose"   EYE SURGERY Right 05/2019   INSERT / REPLACE / REMOVE PACEMAKER  02/15/2017   PACEMAKER IMPLANT N/A 02/15/2017   Procedure: Pacemaker Implant;  Surgeon: Deboraha Sprang, MD;  Location: [facility] [place] [facility];  Service: Cardiovascular;  Laterality: N/A;   THYROIDECTOMY N/A 04/21/2021   Procedure: TOTAL THYROIDECTOMY;  Surgeon: Ralene Ok, MD;  Location: [place];  Service: General;  Laterality: N/A;   Patient Active Problem List   Diagnosis Date Noted   Hypothyroidism 11/07/2021   Thyroid nodule 04/21/2021   S/P total thyroidectomy 04/21/2021   Sinus node dysfunction ([place]) 03/10/2020   Weakness of both legs    Watery eyes    Sickle cell trait ([place])     Presence of permanent cardiac pacemaker    Excess ear wax    Depression    Anemia    Cardiac pacemaker in situ 03/01/2017   Mobitz type 2 second degree heart block 02/15/2017   Symptomatic advanced heart block 02/12/2017   SOB (shortness of breath) 10/02/2014   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   Bifascicular block 10/02/2014   Infection due to trichomonas vaginalis 09/15/2014   Myotonic dystrophy, 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  Felt really good after last pool session.    Pt accompanied by: self  PERTINENT HISTORY: Starting around the age of 30, she started having lock jaw and stiffness of the hands.  Over the years, she developed worsening stiffness of her fingers and hands.  Because her maternal aunt, who is also a patient of mine, had known myotonic dystrophy, she ultimately was genetically tested at the age of 41 which confirmed the diagnosis of myotonic dystrophy type 1. She has a strong family history of DM1 including maternal aunt, cousins x 2, and younger sister.  She does not have any children and has no future plans for pregnancy.   Symptoms were relatively stable until her mid-30s and then started developing fatigue, weakness, daytime sleepiness, and shortness of breath with exertion.  She walks  independently but was told previously told to use leg braces.  Over the past few years, she noticed intermittent difficulty swallowing liquids. She underwent barium swallow which showed signs of aspiration and she was recommended to use a straw and use chin tuck position.     She was seeing several neurologists over the years at [facility] in [place].  She is not working and has been on disability since 2010.  PAIN:  Are you having pain? No Pt reports frequently having pain in legs, worse during day   PRECAUTIONS: Fall and ICD/Pacemaker  WEIGHT BEARING RESTRICTIONS: No  FALLS: Has patient fallen in last 6 months? Yes. Number of falls at least 1  LIVING ENVIRONMENT: Lives with: lives with their family Lives in: House/apartment on 2nd floor Stairs: Yes: Internal: 14 steps; on left going up Has following equipment at home: Quad cane large base, Environmental consultant - 2 wheeled, and Environmental consultant - 4 wheeled  PLOF: Independent with basic ADLs and uses AD occasionally   PATIENT GOALS: "My legs getting stronger without the pain"  OBJECTIVE:   COGNITION: Overall cognitive status: Difficulty to assess due to: no family present   SENSATION: Pt denies numbness/tingling in [place]: hypotonic bilaterally   POSTURE: rounded shoulders   LOWER EXTREMITY MMT:  tested in seated position   MMT Right Eval Left Eval  Hip flexion 3- 3  Hip extension    Hip abduction 3+ 3+  Hip adduction 4- 4-  Hip internal rotation    Hip external rotation    Knee flexion 3+ 3  Knee extension 3+ 3  Ankle dorsiflexion 3 3  Ankle plantarflexion    Ankle inversion    Ankle eversion    (Blank rows = not tested)    TODAY'S TREATMENT:        Patient seen for aquatic therapy today.  Treatment took place in water 3.6-4.0 feet deep depending upon activity.  Pt entered and exited the pool via stairs using B rails in alternating fashion.    In approx 4' dept, had pt ambulate approx 16' x 2 laps  forwards without support, marching forwards x 2 laps holding small paddle board for resistance, backwards x 2 laps (same distance), side stepping with squat and moving arms from abd (when legs in abd) to add when legs adduct with small dumbbells.  Min/guard fading to S.      NMR:  Standing holding yellow dumbbells today for less support; kept RLE in stance moving LLE into hip flex (with knee in ext) straight into ext (with knee ext) x10 reps.  Transitioned to RLE moving into flexion/ext as above x 10 reps.   Also did R and LLE abduction (knee in ext) x 10 reps.    Strengthening:  With 2.5lb ankle weights on each ankle-hip abd x 10 reps, marching x 10 reps, knee flex x 10 reps. Forward step ups with opposite LE march x 10 reps with light single UE support.  Split jumps x 10 reps with BUE support, scissor jumps x 10 reps with UE support from PT.  Moved away from wall and performed both jumping exercises without support.  Added ankle fins and performed "fast" for more resistance x 20 reps each with light UE support from PT.  Ended with hip abd x 10 reps on each side, moving in fast motion for more resistance and requiring more stability in stance leg with light UE support.     At edge of pool with ankle fins donned, had her hold edge of pool and perform leg kicks x 20 reps with support from PT at pelvis.   Ended with leg kicks across pool holding small paddle board for support, again PT supporting pelvis as needed.  Performed x 2 laps.     Pt requires buoyancy of water for support for reduced fall risk, viscosity of water is needed for resistance for strengthening and current of water provides perturbations for challenge for balance training.                                                                                                                              PATIENT EDUCATION: Education details: reasoning for exercises in pool, schedule going forward  Person educated: Patient Education method:  Explanation, Demonstration, Tactile cues, and Verbal cues Education comprehension: needs further education  HOME EXERCISE PROGRAM: To be established   GOALS: Goals reviewed with patient? Yes  SHORT TERM GOALS: Target date: 10/10/2022    Floor transfer to be assessed and LTG written  Baseline: Goal status: INITIAL  2.  Pt will improve 5 x STS to less than or equal to 20 seconds w/BUE support to demonstrate improved functional strength and transfer efficiency.   Baseline: 23.34s w/BUE support  Goal status: INITIAL  3.  Pt will bring in AFOs for gait training in clinic  Baseline:  Goal status: INITIAL  4.  Pt will be independent with initial land HEP for improved strength, balance, transfers and gait.  Baseline: not established on eval  Goal status: INITIAL   LONG TERM GOALS: Target date: 11/07/2022   Floor transfer goal  Baseline:  Goal status: INITIAL  2.  Pt will be independent with final land and aquatic HEP for improved strength, balance, transfers and gait.  Baseline:  Goal status: INITIAL  3.  Pt will improve gait velocity to at least 3.0 ft/s w/LRAD mod I for improved gait efficiency and independence  Baseline: 2.75 ft/s without AD Goal status: INITIAL  4.  Pt will improve 5 x STS to  less than or equal to 16 seconds w/BUE support to demonstrate improved functional strength and transfer efficiency.   Baseline: 23.34s w/BUE support Goal status: INITIAL   ASSESSMENT:  CLINICAL IMPRESSION: Session done in pool today and utilized buoyancy to reduce fall risk and viscosity of water for resistance and strength training.  Emphasized balance and especially strength exercises with decreasing UE support.   Pt continues to show improvements, needing less external support for balance tasks.     OBJECTIVE IMPAIRMENTS: Abnormal gait, decreased activity tolerance, decreased balance, decreased cognition, decreased coordination, decreased endurance, decreased knowledge of  condition, decreased knowledge of use of DME, decreased mobility, difficulty walking, decreased strength, decreased safety awareness, increased muscle spasms, impaired tone, postural dysfunction, and pain.   ACTIVITY LIMITATIONS: carrying, lifting, standing, squatting, sleeping, stairs, locomotion level, and caring for others  PARTICIPATION LIMITATIONS: laundry, driving, shopping, community activity, occupation, and yard work  PERSONAL FACTORS: Behavior pattern, Education, Fitness, Past/current experiences, Transportation, and 1 comorbidity: myotonic dystrophy type 1  are also affecting patient's functional outcome.   REHAB POTENTIAL: Fair due to poor compliance w/therapy in past and lack of social support  CLINICAL DECISION MAKING: Evolving/moderate complexity  EVALUATION COMPLEXITY: Moderate  PLAN:  PT FREQUENCY: 2x/week (1x on land and 1x aquatic)   PT DURATION: 6 weeks  PLANNED INTERVENTIONS: Therapeutic exercises, Therapeutic activity, Neuromuscular re-education, Balance training, Gait training, Patient/Family education, Self Care, Joint mobilization, Stair training, Vestibular training, Canalith repositioning, Orthotic/Fit training, DME instructions, Aquatic Therapy, Dry Needling, Manual therapy, and Re-evaluation  PLAN FOR NEXT SESSION: How did she tolerate the pool? Did she bring AFOs? HEP for BLE strength, walking program, assess floor transfer    Cameron Sprang, PT, MPT [facility] [address] [place], [place], [address] Phone: [phone]   Fax:  [phone] 10/17/22, 12:28 PM

## 2022-10-19 ENCOUNTER — Ambulatory Visit: Payer: 59 | Admitting: Occupational Therapy

## 2022-10-19 ENCOUNTER — Ambulatory Visit: Payer: 59 | Admitting: Physical Therapy

## 2022-10-19 ENCOUNTER — Encounter: Payer: Self-pay | Admitting: Occupational Therapy

## 2022-10-19 DIAGNOSIS — R2681 Unsteadiness on feet: Secondary | ICD-10-CM | POA: Diagnosis not present

## 2022-10-19 DIAGNOSIS — M6281 Muscle weakness (generalized): Secondary | ICD-10-CM

## 2022-10-19 DIAGNOSIS — R2689 Other abnormalities of gait and mobility: Secondary | ICD-10-CM | POA: Diagnosis not present

## 2022-10-19 DIAGNOSIS — Z9181 History of falling: Secondary | ICD-10-CM | POA: Diagnosis not present

## 2022-10-19 DIAGNOSIS — R29898 Other symptoms and signs involving the musculoskeletal system: Secondary | ICD-10-CM | POA: Diagnosis not present

## 2022-10-19 NOTE — Therapy (Signed)
OUTPATIENT OCCUPATIONAL THERAPY NEURO TREATMENT  Patient Name: Cynthia Underwood MRN: [id number] DOB:May 21, 1971, 52 y.o., female Today's Date: 10/19/2022  PCP: Charlott Rakes, MD REFERRING PROVIDER: Alda Berthold, DO  END OF SESSION:  OT End of Session - 10/19/22 1608     Visit Number 3    Number of Visits 17   pt may only need 8 visits if coming 1x/wk, up to 17 visits if coming 2x/wk   Authorization Type UHC MCR primary, MCD secondary    Progress Note Due on Visit 10    OT Start Time 1607    Activity Tolerance Patient tolerated treatment well    Behavior During Therapy [facility] for tasks assessed/performed              Past Medical History:  Diagnosis Date   Bifascicular block 10/02/2014   Dyspnea    Excess ear wax    Multinodular goiter    Pneumonia    Presence of permanent cardiac pacemaker    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   Sickle cell trait ([place])    Watery eyes    Left   Weakness of both legs    Past Surgical History:  Procedure Laterality Date   BREAST BIOPSY Right    EYE SURGERY Left 2017   "related to blockage in my nose"   EYE SURGERY Right 05/2019   INSERT / REPLACE / REMOVE PACEMAKER  02/15/2017   PACEMAKER IMPLANT N/A 02/15/2017   Procedure: Pacemaker Implant;  Surgeon: Deboraha Sprang, MD;  Location: [facility];  Service: Cardiovascular;  Laterality: N/A;   THYROIDECTOMY N/A 04/21/2021   Procedure: TOTAL THYROIDECTOMY;  Surgeon: Ralene Ok, MD;  Location: [facility];  Service: General;  Laterality: N/A;   Patient Active Problem List   Diagnosis Date Noted   Hypothyroidism 11/07/2021   Thyroid nodule 04/21/2021   S/P total thyroidectomy 04/21/2021   Sinus node dysfunction ([place]) 03/10/2020   Weakness of both legs    Watery eyes    Sickle cell trait ([place])    Presence of permanent cardiac pacemaker    Excess ear wax    Depression    Anemia    Cardiac pacemaker in situ 03/01/2017    Mobitz type 2 second degree heart block 02/15/2017   Symptomatic advanced heart block 02/12/2017   SOB (shortness of breath) 10/02/2014   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   Bifascicular block 10/02/2014   Infection due to trichomonas vaginalis 09/15/2014   Myotonic dystrophy, 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   Pt accompanied by: self  PERTINENT HISTORY: right-handed [place] American female with myotonic dystrophy type I complicated by cardiac arryhtmia s/p PPM (June 2018), and hyperlipidemia   PRECAUTIONS: ICD/Pacemaker, NO heavy lifting  WEIGHT BEARING RESTRICTIONS: No  PAIN:  Are you having pain? No  FALLS: Has patient fallen in last 6 months? Yes. Number of falls 1  LIVING ENVIRONMENT: Lives with: lives with their family Lives in: house, 2 story with level entry Has following equipment at home:  AFO's  PLOF: Independent with basic ADLs  PATIENT GOALS:  increase strength in arms and hands  OBJECTIVE: (from evaluation unless otherwise noted)  HAND DOMINANCE: Right  ADLs: Overall ADLs: overall mod I Transfers/ambulation related to ADLs: Eating: mod I - difficulty cutting food Grooming: mod I  UB Dressing: mod I  LB Dressing: mod I  - leaves shoes untied Toileting: independent Bathing: independent Tub Shower transfers: step over tub/shower combo - feels unsteady, takes her time Equipment: none **pt reports difficulty opening containers, soda bottles  IADLs: Shopping: cousin goes normally for pt Light housekeeping: light housework (washing dishes,  takes garbage out, laundry)  Meal Prep: pt does not cook, but does snacks, sandwiches, heats up things in microwave or rarely heats up things on stove Community mobility: family or medical transport for transportation Medication management: forget the pm medicine  Handwriting: 100% legible  MOBILITY STATUS: Independent   UPPER EXTREMITY ROM:  BUE AROM WFL's but very weak   UPPER EXTREMITY MMT:   BUE grossly 3/5   HAND FUNCTION: Grip strength: Right: 21.3 lbs; Left: 20.9 lbs Lateral pinch: Rt = 4, Lt = 6 lbs 3 tip pinch: Rt = 5, Lt = 6  COORDINATION: 9 Hole Peg test: Right: 25.37 sec; Left: 29.66 sec  SENSATION: WFL  EDEMA: none  MUSCLE TONE: RUE: Hypotonic and LUE: Hypotonic  COGNITION: Overall cognitive status: Within functional limits for tasks assessed  VISION: Subjective report: ? Cataracts with surgery Baseline vision: Wears glasses all the time Visual history: cataracts  OBSERVATIONS: Pt reports increased decline in the last several months   TODAY'S TREATMENT:                                                                                                                               - Self-care/home management completed for duration as noted below including:  Patient completed opening of jars and lidded containers of various diameters for improved UE strength and incorporated functional use as well as bimanual coordination. OT educated pt on use of Dycem or shelf liner for added leverage and joint protection. Additionally, pt was educated on different AD for opening of containers.  OT educated pt on use of tub transfer bench including adjustments to bench. Pt verbalized understanding. Request for DME order sent to PCP. Pt encouraged to complete dry run with family present prior to shower completion for added safety.  Therapist reviewed goals with patient and updated patient progression.   PATIENT EDUCATION: Education details: DME, AD, home management Person  educated: Patient Education method: Explanation, Demonstration, and Handouts Education comprehension: verbalized understanding, returned demonstration, and needs further education  HOME EXERCISE PROGRAM: 2/8: B dowel and yellow putty HEPs   GOALS: Goals reviewed with patient? Yes  SHORT TERM GOALS: Target date: 10/19/22  Independent with initial HEP for BUE's Baseline: Goal status: INITIAL  2.  Pt to verbalize understanding of DME (tub bench) and A/E (jar & bottle openers, etc) that would increase safety, ease, and/or independence with ADLS Baseline:  Goal  status: MET  3.  Pt will verbalize strategy for remembering pm medicine Baseline:  Goal status: MET    LONG TERM GOALS: Target date: 11/17/22  Independent with updated HEP for BUE's Baseline:  Goal status: INITIAL  2.  Improve bilateral grip strength by 5 lbs to increase ease with opening jars/containers Baseline: Rt = 21 lbs, Lt = 20 lbs Goal status: INITIAL  3.  Pt to demo tub/shower transfer safely with tub bench prn Baseline:  Goal status: INITIAL   ASSESSMENT:  CLINICAL IMPRESSION: Pt good candidate for skilled OT services to improve BUE strength as needed for more safe and independent completion of ADLs and IADLs.   PERFORMANCE DEFICITS: in functional skills including ADLs, IADLs, coordination, tone, ROM, strength, pain, muscle spasms, Fine motor control, balance, body mechanics, endurance, cardiopulmonary status limiting function, decreased knowledge of use of DME, and UE functional use.   IMPAIRMENTS: are limiting patient from ADLs, IADLs, leisure, and social participation.   CO-MORBIDITIES: may have co-morbidities  that affects occupational performance. Patient will benefit from skilled OT to address above impairments and improve overall function.  REHAB POTENTIAL: Good  PLAN:  OT FREQUENCY: 1-2x/week  OT DURATION: up to 8 weeks (plus eval)  PLANNED INTERVENTIONS: self care/ADL training,  therapeutic exercise, therapeutic activity, neuromuscular re-education, manual therapy, passive range of motion, functional mobility training, aquatic therapy, moist heat, patient/family education, cognitive remediation/compensation, visual/perceptual remediation/compensation, coping strategies training, and DME and/or AE instructions  RECOMMENDED OTHER SERVICES: none at this time  CONSULTED AND AGREED WITH PLAN OF CARE: Patient  PLAN FOR NEXT SESSION: Review HEP (BUE cane HEP, and yellow putty HEP); Theraband HEP?; did she get tub transfer bench?   Dennis Bast, OT 10/19/2022, 4:09 PM

## 2022-10-19 NOTE — Therapy (Signed)
OUTPATIENT PHYSICAL THERAPY NEURO TREATMENT    Patient Name: Cynthia Underwood MRN: [id number] DOB:[birth year], 52 y.o., female Today's Date: 10/19/2022   PCP: Charlott Rakes, MD REFERRING PROVIDER: Alda Berthold, DO  END OF SESSION:  PT End of Session - 10/19/22 1519     Visit Number 7    Number of Visits 13   Plus eval   Date for PT Re-Evaluation 11/06/22   Due to delay in scheduling   Authorization Type UHC Medicare    Progress Note Due on Visit 10    PT Start Time 1518    PT Stop Time 1601    PT Time Calculation (min) 43 min    Activity Tolerance Patient tolerated treatment well    Behavior During Therapy [facility] for tasks assessed/performed               Past Medical History:  Diagnosis Date   Bifascicular block 10/02/2014   Dyspnea    Excess ear wax    Multinodular goiter    Pneumonia    Presence of permanent cardiac pacemaker    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   Sickle cell trait ([place])    Watery eyes    Left   Weakness of both legs    Past Surgical History:  Procedure Laterality Date   BREAST BIOPSY Right    EYE SURGERY Left 2017   "related to blockage in my nose"   EYE SURGERY Right 05/2019   INSERT / REPLACE / REMOVE PACEMAKER  02/15/2017   PACEMAKER IMPLANT N/A 02/15/2017   Procedure: Pacemaker Implant;  Surgeon: Deboraha Sprang, MD;  Location: [place] [facility];  Service: Cardiovascular;  Laterality: N/A;   THYROIDECTOMY N/A 04/21/2021   Procedure: TOTAL THYROIDECTOMY;  Surgeon: Ralene Ok, MD;  Location: [place];  Service: General;  Laterality: N/A;   Patient Active Problem List   Diagnosis Date Noted   Hypothyroidism 11/07/2021   Thyroid nodule 04/21/2021   S/P total thyroidectomy 04/21/2021   Sinus node dysfunction ([place]) 03/10/2020   Weakness of both legs    Watery eyes    Sickle cell trait ([place])    Presence of permanent cardiac pacemaker    Excess ear wax    Depression    Anemia     Cardiac pacemaker in situ 03/01/2017   Mobitz type 2 second degree heart block 02/15/2017   Symptomatic advanced heart block 02/12/2017   SOB (shortness of breath) 10/02/2014   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   Bifascicular block 10/02/2014   Infection due to trichomonas vaginalis 09/15/2014   Myotonic dystrophy, 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 Had a good pool session this week.    Pt accompanied by: self  PERTINENT HISTORY: Starting around the age of 35, she started having lock jaw and stiffness of the hands.  Over the years, she developed worsening stiffness of her fingers and hands.  Because her maternal aunt, who is also a patient of mine, had known myotonic dystrophy, she ultimately was genetically tested at the age of 61 which confirmed the diagnosis of myotonic dystrophy type 1. She has a strong family history of DM1 including maternal aunt, cousins x 2, and younger sister.  She does not have any children and has no future plans for pregnancy.   Symptoms were relatively stable until her mid-30s and then started developing fatigue, weakness, daytime sleepiness, and shortness of breath with exertion.  She walks independently but was told previously told to use leg braces.  Over the past few years, she noticed intermittent difficulty swallowing liquids. She underwent barium swallow which showed signs  of aspiration and she was recommended to use a straw and use chin tuck position.     She was seeing several neurologists over the years at [place] in [place].  She is not working and has been on disability since 2010.  PAIN:  Are you having pain? No Pt reports frequently having pain in legs, worse during day   PRECAUTIONS: Fall and ICD/Pacemaker  WEIGHT BEARING RESTRICTIONS: No  FALLS: Has patient fallen in last 6 months? Yes. Number of falls at least 1  LIVING ENVIRONMENT: Lives with: lives with their family Lives in: House/apartment on 2nd floor Stairs: Yes: Internal: 14 steps; on left going up Has following equipment at home: Quad cane large base, Environmental consultant - 2 wheeled, and [employer] - 4 wheeled  PLOF: Independent with basic ADLs and uses AD occasionally   PATIENT GOALS: "My legs getting stronger without the pain"  OBJECTIVE:   COGNITION: Overall cognitive status: Difficulty to assess due to: no family present   SENSATION: Pt denies numbness/tingling in [place]: hypotonic bilaterally   POSTURE: rounded shoulders   LOWER EXTREMITY MMT:  tested in seated position   MMT Right Eval Left Eval  Hip flexion 3- 3  Hip extension    Hip abduction 3+ 3+  Hip adduction 4- 4-  Hip internal rotation    Hip external rotation    Knee flexion 3+ 3  Knee extension 3+ 3  Ankle dorsiflexion 3 3  Ankle plantarflexion    Ankle inversion    Ankle eversion    (Blank rows = not tested)    TODAY'S TREATMENT:      Ther Ex  SciFit multi-peaks level 4 for 8 minutes using BUE/BLEs for neural priming for reciprocal movement, dynamic cardiovascular warmup and global strength. RPE of 5/10 following activity  In // bars for improved ankle strategy, single leg stability and LE coordination:  On blue airex, alt toe taps to cone w/intermittent UE support, x12 per side. Increased difficulty when tapping w/RLE >LLE. Progressed to double cone taps, x8 per side  On  purple beam, side stepping down and back w/intermittent BUE support, x3 each way. Increased difficulty stepping to L side > R side  RPE of 6/10 following activity  Seated march overs using 10# KB, 2x10 per side, for improved hip flexor/abductor/adductor strength and core stability. Min cues to avoid posterior lean compensation throughout. Pt w/increased difficulty performing second set  and relied on posterior lean to lift leg.  Walking w/15# surge for 250' around clinic for improved global strength and reactive balance strategies. Pt fatigued fairly quickly but performed well without LOB.  Leg presses for improved BLE strength and knee control:  Double leg presses at 40# x12 reps (tried 50 and 60# but too heavy for pt). Min cues to avoid full extension of knees at top of rep.  Single leg presses at 30#, x10 per side. Increased difficulty w/eccentric control of RLE >LLE  Ended w/5 double leg presses at 50#, as pt reports she can handle more weight once warmed up. No difficulty noted.                                                                                                                       PATIENT EDUCATION: Education details: Continue HEP  Person educated: Patient Education method: Explanation, Media planner, and Handouts Education comprehension: verbalized understanding  HOME EXERCISE PROGRAM: Access Code: [email or web address] URL: [email or web address] Date: 10/12/2022 Prepared by: Mickie Bail Mariadelaluz Gug[place]  Exercises - Sit to Stand Without Arm Support  - 1 x daily - 7 x weekly - 2 sets - 6-10 reps - Heel Raises with Counter Support  - 1 x daily - 7 x weekly - 3 sets - 10 reps - Toe Walking with Counter Support  - 1 x daily - 7 x weekly - 3 sets - 10 reps - Heel Walking with Counter Support  - 1 x daily - 7 x weekly - 3 sets - 10 reps  GOALS: Goals reviewed with patient? Yes  SHORT TERM GOALS: Target date: 10/10/2022    Floor transfer to be assessed and LTG written   Baseline: Goal status: IN PROGRESS  2.  Pt will improve 5 x STS to less than or equal to 20 seconds w/BUE support to demonstrate improved functional strength and transfer efficiency.   Baseline: 23.34s w/BUE support; 21.72s without UE support  Goal status: PARTIALLY MET  3.  Pt will bring in AFOs for gait training in clinic  Baseline:  Goal status: MET  4.  Pt will be independent with initial land HEP for improved strength, balance, transfers and gait.  Baseline: established on 2/8  Goal status: IN PROGRESS   LONG TERM GOALS: Target date: 11/07/2022   Floor transfer goal  Baseline:  Goal status: INITIAL  2.  Pt will be independent with final land and aquatic HEP for improved strength, balance, transfers and gait.  Baseline:  Goal status: INITIAL  3.  Pt will improve gait velocity to at least 3.0 ft/s w/LRAD mod I for improved gait efficiency and independence  Baseline: 2.75 ft/s without AD Goal status: INITIAL  4.  Pt will improve 5 x STS to less than or equal to 16 seconds w/BUE support to demonstrate improved functional strength and transfer efficiency.   Baseline: 23.34s w/BUE support Goal status: INITIAL   ASSESSMENT:  CLINICAL IMPRESSION: Emphasis of skilled PT session on BLE strength, single leg stability  and LE coordination. Pt tolerated session well w/no increase in pain. Pt most challenged by standing on airex and relied on UE support for steadying assist. Pt has not tried her HEP yet but reports she will try it Monday. Continue POC.    OBJECTIVE IMPAIRMENTS: Abnormal gait, decreased activity tolerance, decreased balance, decreased cognition, decreased coordination, decreased endurance, decreased knowledge of condition, decreased knowledge of use of DME, decreased mobility, difficulty walking, decreased strength, decreased safety awareness, increased muscle spasms, impaired tone, postural dysfunction, and pain.   ACTIVITY LIMITATIONS: carrying, lifting,  standing, squatting, sleeping, stairs, locomotion level, and caring for others  PARTICIPATION LIMITATIONS: laundry, driving, shopping, community activity, occupation, and yard work  PERSONAL FACTORS: Behavior pattern, Education, Fitness, Past/current experiences, Transportation, and 1 comorbidity: myotonic dystrophy type 1  are also affecting patient's functional outcome.   REHAB POTENTIAL: Fair due to poor compliance w/therapy in past and lack of social support  CLINICAL DECISION MAKING: Evolving/moderate complexity  EVALUATION COMPLEXITY: Moderate  PLAN:  PT FREQUENCY: 2x/week (1x on land and 1x aquatic)   PT DURATION: 6 weeks  PLANNED INTERVENTIONS: Therapeutic exercises, Therapeutic activity, Neuromuscular re-education, Balance training, Gait training, Patient/Family education, Self Care, Joint mobilization, Stair training, Vestibular training, Canalith repositioning, Orthotic/Fit training, DME instructions, Aquatic Therapy, Dry Needling, Manual therapy, and Re-evaluation  PLAN FOR NEXT SESSION: Did Mickie Bail get pediatric AFOs? How is HEP? Global strength - eccentric heel taps, scifit, floor transfer, elliptical, eccentric heel taps, glute med strength   Cruzita Lederer Damon Baisch, PT, DPT [facility] [address] [place], [place]  [address] Phone:  [phone] Fax:  [phone] 10/19/22, 4:02 PM

## 2022-10-23 ENCOUNTER — Other Ambulatory Visit: Payer: Self-pay | Admitting: Family Medicine

## 2022-10-23 MED ORDER — MISC. DEVICES MISC: 0 refills | Status: DC

## 2022-10-24 ENCOUNTER — Ambulatory Visit: Payer: 59 | Admitting: Rehabilitation

## 2022-10-24 DIAGNOSIS — R2681 Unsteadiness on feet: Secondary | ICD-10-CM | POA: Diagnosis not present

## 2022-10-24 DIAGNOSIS — Z9181 History of falling: Secondary | ICD-10-CM | POA: Diagnosis not present

## 2022-10-24 DIAGNOSIS — R29898 Other symptoms and signs involving the musculoskeletal system: Secondary | ICD-10-CM | POA: Diagnosis not present

## 2022-10-24 DIAGNOSIS — M6281 Muscle weakness (generalized): Secondary | ICD-10-CM

## 2022-10-24 DIAGNOSIS — R2689 Other abnormalities of gait and mobility: Secondary | ICD-10-CM | POA: Diagnosis not present

## 2022-10-24 NOTE — Therapy (Signed)
OUTPATIENT PHYSICAL THERAPY NEURO TREATMENT    Patient Name: Cynthia Underwood MRN: [id number] DOB:08/13/1971, 52 y.o., female Today's Date: 10/24/2022   PCP: Charlott Rakes, MD REFERRING PROVIDER: Alda Berthold, DO  END OF SESSION:  PT End of Session - 10/24/22 [id number]     Visit Number 8    Number of Visits 13   Plus eval   Date for PT Re-Evaluation 11/06/22   Due to delay in scheduling   Authorization Type UHC Medicare    Progress Note Due on Visit 10    PT Start Time 1145    PT Stop Time 1230    PT Time Calculation (min) 45 min    Activity Tolerance Patient tolerated treatment well    Behavior During Therapy [facility] for tasks assessed/performed             Past Medical History:  Diagnosis Date   Bifascicular block 10/02/2014   Dyspnea    Excess ear wax    Multinodular goiter    Pneumonia    Presence of permanent cardiac pacemaker    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   Sickle cell trait ([place])    Watery eyes    Left   Weakness of both legs    Past Surgical History:  Procedure Laterality Date   BREAST BIOPSY Right    EYE SURGERY Left 2017   "related to blockage in my nose"   EYE SURGERY Right 05/2019   INSERT / REPLACE / REMOVE PACEMAKER  02/15/2017   PACEMAKER IMPLANT N/A 02/15/2017   Procedure: Pacemaker Implant;  Surgeon: Deboraha Sprang, MD;  Location: [place] [facility];  Service: Cardiovascular;  Laterality: N/A;   THYROIDECTOMY N/A 04/21/2021   Procedure: TOTAL THYROIDECTOMY;  Surgeon: Ralene Ok, MD;  Location: [place];  Service: General;  Laterality: N/A;   Patient Active Problem List   Diagnosis Date Noted   Hypothyroidism 11/07/2021   Thyroid nodule 04/21/2021   S/P total thyroidectomy 04/21/2021   Sinus node dysfunction ([place]) 03/10/2020   Weakness of both legs    Watery eyes    Sickle cell trait ([place])    Presence of permanent cardiac pacemaker    Excess ear wax    Depression    Anemia     Cardiac pacemaker in situ 03/01/2017   Mobitz type 2 second degree heart block 02/15/2017   Symptomatic advanced heart block 02/12/2017   SOB (shortness of breath) 10/02/2014   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   Bifascicular block 10/02/2014   Infection due to trichomonas vaginalis 09/15/2014   Myotonic dystrophy, 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  Was sore after last land session,    Pt accompanied by: self  PERTINENT HISTORY: Starting around the age of 85, she started having lock jaw and stiffness of the hands.  Over the years, she developed worsening stiffness of her fingers and hands.  Because her maternal aunt, who is also a patient of mine, had known myotonic dystrophy, she ultimately was genetically tested at the age of 81 which confirmed the diagnosis of myotonic dystrophy type 1. She has a strong family history of DM1 including maternal aunt, cousins x 2, and younger sister.  She does not have any children and has no future plans for pregnancy.   Symptoms were relatively stable until her mid-30s and then started developing fatigue, weakness, daytime sleepiness, and shortness of breath with exertion.  She walks independently but was told previously told to use leg braces.  Over the past few years, she noticed intermittent difficulty  swallowing liquids. She underwent barium swallow which showed signs of aspiration and she was recommended to use a straw and use chin tuck position.     She was seeing several neurologists over the years at [place] in [place].  She is not working and has been on disability since 2010.  PAIN:  Are you having pain? No Pt reports frequently having pain in legs, worse during day   PRECAUTIONS: Fall and ICD/Pacemaker  WEIGHT BEARING RESTRICTIONS: No  FALLS: Has patient fallen in last 6 months? Yes. Number of falls at least 1  LIVING ENVIRONMENT: Lives with: lives with their family Lives in: House/apartment on 2nd floor Stairs: Yes: Internal: 14 steps; on left going up Has following equipment at home: Quad cane large base, Environmental consultant - 2 wheeled, and Environmental consultant - 4 wheeled  PLOF: Independent with basic ADLs and uses AD occasionally   PATIENT GOALS: "My legs getting stronger without the pain"  OBJECTIVE:   COGNITION: Overall cognitive status: Difficulty to assess due to: no family present   SENSATION: Pt denies numbness/tingling in [place]: hypotonic bilaterally   POSTURE: rounded shoulders   LOWER EXTREMITY MMT:  tested in seated position   MMT Right Eval Left Eval  Hip flexion 3- 3  Hip extension    Hip abduction 3+ 3+  Hip adduction 4- 4-  Hip internal rotation    Hip external rotation    Knee flexion 3+ 3  Knee extension 3+ 3  Ankle dorsiflexion 3 3  Ankle plantarflexion    Ankle inversion    Ankle eversion    (Blank rows = not tested)    TODAY'S TREATMENT:        Patient seen for aquatic therapy today.  Treatment took place in water 3.6-4.0 feet deep depending upon activity.  Pt entered and exited the pool via stairs using B rails in alternating fashion.    In approx 4' dept, had pt ambulate approx 18' x 2 laps forwards without support, backwards x 2 laps without support, marching forwards x 2 laps holding small dumbbells moving in alt  pattern, side stepping with squat and moving arms from abd (when legs in abd) to add when legs adduct with small dumbbells x 2 laps.  Pt mostly S, some min/guard to avoid others in pool.    NMR:  Standing holding yellow dumbbells or noodle today for less support; kept RLE in stance moving LLE into hip flex (with knee in ext)  straight into ext (with knee ext) x10 reps. Transitioned to RLE moving into flexion/ext as above x 10 reps.   Also did R and LLE abduction (knee in ext) x 10 reps.  Standing on square pool noodle with feet shoulder width apart maintaining balance x 2 sets of 20 secs, progressing to alt UE flex x 10 reps>adding in opposite head turns x 10 reps.  Pt did all standing on noodle with light hand held touch from PT and intermittent support from wall.    Strengthening:  With 2.5lb ankle weights on each ankle-marching x 10 reps. Forward step ups with opposite LE march x 10 reps with light single UE support to no UE support.  Lateral step up/down x 10 reps each direction.  For core strengthening, had pt place feet together holding small barbells performing scap retraction/protraction x 10 reps, shoulder flex/ext x 10 reps, and shoulder abd/add x 10 reps with cues to move faster to allow core to have to stabilize more.  Sitting "saddle style" on yellow pool noodle, maintaining balance x 2 sets of 20 secs, paddling to end of pool x 18' with min/guard to min A and cues for more upright posture as she tends to lean forward too much.   Attempted "swing style" sitting on noodle.  This was more difficult but she is able to maintain position x 10-15 secs x 2-3 reps with CGA to min A      Pt requires buoyancy of water for support for reduced fall risk, viscosity of water is needed for resistance for strengthening and current of water provides perturbations for challenge for balance training.                                                                                                                               PATIENT EDUCATION: Education details: reasoning for exercises in pool, schedule going forward  Person educated: Patient Education method: Explanation, Demonstration, Tactile cues, and Verbal cues Education comprehension: needs further education  HOME EXERCISE PROGRAM: To be established   GOALS: Goals reviewed with patient? Yes  SHORT TERM GOALS: Target date: 10/10/2022    Floor transfer to be assessed and LTG written  Baseline: Goal status: INITIAL  2.  Pt will improve 5 x STS to less than or equal to 20 seconds w/BUE support to demonstrate improved functional strength and transfer efficiency.   Baseline: 23.34s w/BUE support  Goal status: INITIAL  3.  Pt will bring in AFOs for gait training in clinic  Baseline:  Goal status: INITIAL  4.  Pt will be independent with initial land HEP for improved strength, balance, transfers and gait.  Baseline: not established on eval  Goal status: INITIAL   LONG TERM GOALS: Target date: 11/07/2022   Floor transfer goal  Baseline:  Goal status: INITIAL  2.  Pt will be independent with final land and aquatic HEP for improved strength, balance, transfers and gait.  Baseline:  Goal status: INITIAL  3.  Pt will improve gait velocity to at least 3.0 ft/s w/LRAD mod I for improved gait efficiency and independence  Baseline: 2.75 ft/s without AD Goal status: INITIAL  4.  Pt will improve 5 x STS to less than or equal to 16 seconds w/BUE support to demonstrate improved functional strength and transfer efficiency.   Baseline: 23.34s w/BUE support Goal status: INITIAL   ASSESSMENT:  CLINICAL IMPRESSION: Session done in pool today and utilized buoyancy to reduce fall risk and viscosity of water for resistance and strength training.  Emphasized balance and especially strength exercises with decreasing UE support.   Also added more dynamic balance standing on compliant surface, needing to utilize more hip strategy.  Pt  continues to show improvements, needing less external support for balance tasks.     OBJECTIVE IMPAIRMENTS: Abnormal gait, decreased activity tolerance, decreased balance, decreased cognition, decreased coordination, decreased endurance, decreased knowledge of condition, decreased knowledge of use of DME, decreased mobility, difficulty walking, decreased strength, decreased safety awareness, increased muscle spasms, impaired tone, postural dysfunction, and pain.   ACTIVITY LIMITATIONS: carrying, lifting, standing, squatting, sleeping, stairs, locomotion level, and caring for others  PARTICIPATION LIMITATIONS: laundry, driving, shopping, community activity, occupation, and yard work  PERSONAL FACTORS: Behavior pattern, Education, Fitness, Past/current experiences, Transportation, and 1 comorbidity: myotonic dystrophy type 1  are also affecting patient's functional outcome.   REHAB POTENTIAL: Fair due to poor compliance w/therapy in past and lack of social support  CLINICAL DECISION MAKING: Evolving/moderate complexity  EVALUATION COMPLEXITY: Moderate  PLAN:  PT FREQUENCY: 2x/week (1x on land and 1x aquatic)   PT DURATION: 6 weeks  PLANNED INTERVENTIONS: Therapeutic exercises, Therapeutic activity, Neuromuscular re-education, Balance training, Gait training, Patient/Family education, Self Care, Joint mobilization, Stair training, Vestibular training, Canalith repositioning, Orthotic/Fit training, DME instructions, Aquatic Therapy, Dry Needling, Manual therapy, and Re-evaluation  PLAN FOR NEXT SESSION: How did she tolerate the pool? Did she bring AFOs? HEP for BLE strength, walking program, assess floor transfer    Cameron Sprang, PT, MPT [facility] [address] [place], [place], [address] Phone: [phone]   Fax:  [phone] 10/24/22, 2:10 PM

## 2022-10-25 ENCOUNTER — Ambulatory Visit: Payer: 59

## 2022-10-25 ENCOUNTER — Telehealth: Payer: Self-pay | Admitting: *Deleted

## 2022-10-25 NOTE — Progress Notes (Deleted)
No egg or soy allergy known to patient  No issues known to pt with past sedation with any surgeries or procedures Patient denies ever being told they had issues or difficulty with intubation  No FH of Malignant Hyperthermia Pt is not on diet pills Pt is not on  home 02  Pt is not on blood thinners  Pt denies issues with constipation  Pt is not on dialysis Pt denies any upcoming cardiac testing Pt encouraged to use to use Singlecare or Goodrx to reduce cost  Patient's chart reviewed by Osvaldo Angst CNRA prior to previsit and patient appropriate for the [place].  Previsit completed and red dot placed by patient's name on their procedure day (on provider's schedule).  . Visit by phone Instructions reviewed with pt and pt states understanding. Instructed to review again prior to procedure. Pt states they will. Instructions sent by mail and my chart

## 2022-10-25 NOTE — Telephone Encounter (Signed)
Attempt to reach pt for appt. LM with call back #

## 2022-10-26 ENCOUNTER — Encounter: Payer: Self-pay | Admitting: Gastroenterology

## 2022-10-27 ENCOUNTER — Ambulatory Visit: Payer: 59 | Admitting: Physical Therapy

## 2022-10-27 DIAGNOSIS — M6281 Muscle weakness (generalized): Secondary | ICD-10-CM

## 2022-10-27 DIAGNOSIS — R2689 Other abnormalities of gait and mobility: Secondary | ICD-10-CM | POA: Diagnosis not present

## 2022-10-27 DIAGNOSIS — R29898 Other symptoms and signs involving the musculoskeletal system: Secondary | ICD-10-CM | POA: Diagnosis not present

## 2022-10-27 DIAGNOSIS — R2681 Unsteadiness on feet: Secondary | ICD-10-CM

## 2022-10-27 DIAGNOSIS — Z9181 History of falling: Secondary | ICD-10-CM | POA: Diagnosis not present

## 2022-10-27 NOTE — Therapy (Signed)
OUTPATIENT PHYSICAL THERAPY NEURO TREATMENT    Patient Name: Cynthia Underwood MRN: [id number] DOB:[birth year], 52 y.o., female Today's Date: 10/27/2022   PCP: Charlott Rakes, MD REFERRING PROVIDER: Alda Berthold, DO  END OF SESSION:  PT End of Session - 10/27/22 1235     Visit Number 9    Number of Visits 13   Plus eval   Date for PT Re-Evaluation 11/06/22   Due to delay in scheduling   Authorization Type [employer] Medicare    Progress Note Due on Visit 10    PT Start Time 1232    PT Stop Time 1315    PT Time Calculation (min) 43 min    Activity Tolerance Patient tolerated treatment well    Behavior During Therapy [facility] for tasks assessed/performed                Past Medical History:  Diagnosis Date   Bifascicular block 10/02/2014   Dyspnea    Excess ear wax    Multinodular goiter    Pneumonia    Presence of permanent cardiac pacemaker    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   Sickle cell trait ([place])    Watery eyes    Left   Weakness of both legs    Past Surgical History:  Procedure Laterality Date   BREAST BIOPSY Right    EYE SURGERY Left 2017   "related to blockage in my nose"   EYE SURGERY Right 05/2019   INSERT / REPLACE / REMOVE PACEMAKER  02/15/2017   PACEMAKER IMPLANT N/A 02/15/2017   Procedure: Pacemaker Implant;  Surgeon: Deboraha Sprang, MD;  Location: [place] [facility];  Service: Cardiovascular;  Laterality: N/A;   THYROIDECTOMY N/A 04/21/2021   Procedure: TOTAL THYROIDECTOMY;  Surgeon: Ralene Ok, MD;  Location: [facility];  Service: General;  Laterality: N/A;   Patient Active Problem List   Diagnosis Date Noted   Hypothyroidism 11/07/2021   Thyroid nodule 04/21/2021   S/P total thyroidectomy 04/21/2021   Sinus node dysfunction ([place]) 03/10/2020   Weakness of both legs    Watery eyes    Sickle cell trait ([place])    Presence of permanent cardiac pacemaker    Excess ear wax    Depression    Anemia     Cardiac pacemaker in situ 03/01/2017   Mobitz type 2 second degree heart block 02/15/2017   Symptomatic advanced heart block 02/12/2017   SOB (shortness of breath) 10/02/2014   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   Bifascicular block 10/02/2014   Infection due to trichomonas vaginalis 09/15/2014   Myotonic dystrophy, 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 Had a good pool session this week.    Pt accompanied by: self  PERTINENT HISTORY: Starting around the age of 40, she started having lock jaw and stiffness of the hands.  Over the years, she developed worsening stiffness of her fingers and hands.  Because her maternal aunt, who is also a patient of mine, had known myotonic dystrophy, she ultimately was genetically tested at the age of 32 which confirmed the diagnosis of myotonic dystrophy type 1. She has a strong family history of DM1 including maternal aunt, cousins x 2, and younger sister.  She does not have any children and has no future plans for pregnancy.   Symptoms were relatively stable until her mid-30s and then started developing fatigue, weakness, daytime sleepiness, and shortness of breath with exertion.  She walks independently but was told previously told to use leg braces.  Over the past few years, she noticed intermittent difficulty swallowing liquids. She underwent barium swallow which showed  signs of aspiration and she was recommended to use a straw and use chin tuck position.     She was seeing several neurologists over the years at [place] in [place].  She is not working and has been on disability since 2010.  PAIN:  Are you having pain? No Pt reports frequently having pain in legs, worse during day   PRECAUTIONS: Fall and ICD/Pacemaker  WEIGHT BEARING RESTRICTIONS: No  FALLS: Has patient fallen in last 6 months? Yes. Number of falls at least 1  LIVING ENVIRONMENT: Lives with: lives with their family Lives in: House/apartment on 2nd floor Stairs: Yes: Internal: 14 steps; on left going up Has following equipment at home: Quad cane large base, Environmental consultant - 2 wheeled, and [employer] - 4 wheeled  PLOF: Independent with basic ADLs and uses AD occasionally   PATIENT GOALS: "My legs getting stronger without the pain"  OBJECTIVE:   COGNITION: Overall cognitive status: Difficulty to assess due to: no family present   SENSATION: Pt denies numbness/tingling in [place]: hypotonic bilaterally   POSTURE: rounded shoulders   LOWER EXTREMITY MMT:  tested in seated position   MMT Right Eval Left Eval  Hip flexion 3- 3  Hip extension    Hip abduction 3+ 3+  Hip adduction 4- 4-  Hip internal rotation    Hip external rotation    Knee flexion 3+ 3  Knee extension 3+ 3  Ankle dorsiflexion 3 3  Ankle plantarflexion    Ankle inversion    Ankle eversion    (Blank rows = not tested)    TODAY'S TREATMENT:      Ther Ex  Elliptical level 1 for 6 minutes (4 minutes fwd, 2 retro) broken into sets of 2 minutes due to fatigue w/CGA for improved BLE strength and endurance. Pt very challenged by retro direction and required frequent, short standing rest breaks during 2 minute interval.  In // bars for improved eccentric quad strength, single leg stability, posterior chain strength and hip strength:  Alt fwd eccentric heel taps from 4" step w/BUE support,  x15 per side w/BUE support. Lat eccentric heel taps from 4" step, x15 per side w/BUE support DL to 6" step using 12# KB, x10 reps. Min cues for proper form initially but pt performed well.  Progressed to staggered stance DL, x8 per side, using 12# KB for increased balance challenge  Alt fwd step ups w/contralateral  march on [place]u (blue side up) ,x15 per side, starting w/BUE support and progressing to single UE support. No instability noted Progressed to lateral step ups w/contralateral march and BUE support, x12 per side. Pt with increased difficulty w/lateral direction and reported increased fatigue on RLE>LLE  RPE of 8/10 following activity                                                                                                                       PATIENT EDUCATION: Education details: Continue HEP, plan to hold off on AFOs for now  Person educated: Patient Education method: Explanation, Demonstration, and Handouts Education comprehension: verbalized understanding  HOME EXERCISE PROGRAM: Access Code: [email or web address] URL: [email or web address] Date: 10/12/2022 Prepared by: Mickie Bail Kim Lauver  Exercises - Sit to Stand Without Arm Support  - 1 x daily - 7 x weekly - 2 sets - 6-10 reps - Heel Raises with Counter Support  - 1 x daily - 7 x weekly - 3 sets - 10 reps - Toe Walking with Counter Support  - 1 x daily - 7 x weekly - 3 sets - 10 reps - Heel Walking with Counter Support  - 1 x daily - 7 x weekly - 3 sets - 10 reps  GOALS: Goals reviewed with patient? Yes  SHORT TERM GOALS: Target date: 10/10/2022    Floor transfer to be assessed and LTG written  Baseline: Goal status: IN PROGRESS  2.  Pt will improve 5 x STS to less than or equal to 20 seconds w/BUE support to demonstrate improved functional strength and transfer efficiency.   Baseline: 23.34s w/BUE support; 21.72s without UE support  Goal status: PARTIALLY MET  3.  Pt will bring in AFOs for gait training in  clinic  Baseline:  Goal status: MET  4.  Pt will be independent with initial land HEP for improved strength, balance, transfers and gait.  Baseline: established on 2/8  Goal status: IN PROGRESS   LONG TERM GOALS: Target date: 11/07/2022   Floor transfer goal  Baseline:  Goal status: INITIAL  2.  Pt will be independent with final land and aquatic HEP for improved strength, balance, transfers and gait.  Baseline:  Goal status: INITIAL  3.  Pt will improve gait velocity to at least 3.0 ft/s w/LRAD mod I for improved gait efficiency and independence  Baseline: 2.75 ft/s without AD Goal status: INITIAL  4.  Pt will improve 5 x STS to less than or equal to 16 seconds w/BUE support to demonstrate improved functional strength and transfer efficiency.   Baseline: 23.34s w/BUE support Goal status: INITIAL   ASSESSMENT:  CLINICAL IMPRESSION: Emphasis of skilled PT session on global strength, single leg stability and endurance. Pt tolerated session well but did fatigue quickly, especially w/interventions targeting hamstrings. Pt in agreement to hold off on AFOs for now as she is able to walk independently and she does not want to rely on braces. Continue POC.    OBJECTIVE IMPAIRMENTS: Abnormal gait, decreased  activity tolerance, decreased balance, decreased cognition, decreased coordination, decreased endurance, decreased knowledge of condition, decreased knowledge of use of DME, decreased mobility, difficulty walking, decreased strength, decreased safety awareness, increased muscle spasms, impaired tone, postural dysfunction, and pain.   ACTIVITY LIMITATIONS: carrying, lifting, standing, squatting, sleeping, stairs, locomotion level, and caring for others  PARTICIPATION LIMITATIONS: laundry, driving, shopping, community activity, occupation, and yard work  PERSONAL FACTORS: Behavior pattern, Education, Fitness, Past/current experiences, Transportation, and 1 comorbidity: myotonic  dystrophy type 1  are also affecting patient's functional outcome.   REHAB POTENTIAL: Fair due to poor compliance w/therapy in past and lack of social support  CLINICAL DECISION MAKING: Evolving/moderate complexity  EVALUATION COMPLEXITY: Moderate  PLAN:  PT FREQUENCY: 2x/week (1x on land and 1x aquatic)   PT DURATION: 6 weeks  PLANNED INTERVENTIONS: Therapeutic exercises, Therapeutic activity, Neuromuscular re-education, Balance training, Gait training, Patient/Family education, Self Care, Joint mobilization, Stair training, Vestibular training, Canalith repositioning, Orthotic/Fit training, DME instructions, Aquatic Therapy, Dry Needling, Manual therapy, and Re-evaluation  PLAN FOR NEXT SESSION: is HEP? Global strength - eccentric heel taps, scifit, floor transfer, elliptical, eccentric heel taps, glute med strength   Cruzita Lederer Shiann Kam, PT, DPT [place] [address] [place], [place]  [address] Phone:  [phone] Fax:  [phone] 10/27/22, 1:17 PM

## 2022-10-30 ENCOUNTER — Telehealth: Payer: Self-pay | Admitting: Emergency Medicine

## 2022-10-30 NOTE — Telephone Encounter (Signed)
Copied from [place]. Topic: General - Inquiry >> Oct 30, 2022  9:44 AM Marcellus Scott wrote: Reason for CRM: [name] with [employer] they have made three attempts to contact pt in regards to the tub transfer bench. Stated this will be put on hold until pt reaches out to them.  Please advise.

## 2022-10-30 NOTE — Telephone Encounter (Signed)
Attempted to contact patient and LVM, mychart message has been sent to patient also.

## 2022-10-31 ENCOUNTER — Ambulatory Visit (AMBULATORY_SURGERY_CENTER): Payer: 59 | Admitting: *Deleted

## 2022-10-31 ENCOUNTER — Telehealth: Payer: Self-pay | Admitting: *Deleted

## 2022-10-31 ENCOUNTER — Encounter: Payer: Self-pay | Admitting: Rehabilitation

## 2022-10-31 ENCOUNTER — Ambulatory Visit: Payer: 59 | Admitting: Rehabilitation

## 2022-10-31 VITALS — Ht 63.0 in | Wt 103.0 lb

## 2022-10-31 DIAGNOSIS — M6281 Muscle weakness (generalized): Secondary | ICD-10-CM | POA: Diagnosis not present

## 2022-10-31 DIAGNOSIS — R29898 Other symptoms and signs involving the musculoskeletal system: Secondary | ICD-10-CM

## 2022-10-31 DIAGNOSIS — R2681 Unsteadiness on feet: Secondary | ICD-10-CM | POA: Diagnosis not present

## 2022-10-31 DIAGNOSIS — Z86010 Personal history of colonic polyps: Secondary | ICD-10-CM

## 2022-10-31 DIAGNOSIS — R2689 Other abnormalities of gait and mobility: Secondary | ICD-10-CM

## 2022-10-31 DIAGNOSIS — Z9181 History of falling: Secondary | ICD-10-CM | POA: Diagnosis not present

## 2022-10-31 MED ORDER — PEG 3350-KCL-NA BICARB-NACL 420 G PO SOLR: 4000.0000 mL | Freq: Once | ORAL | 0 refills | Status: AC

## 2022-10-31 NOTE — Therapy (Signed)
OUTPATIENT PHYSICAL THERAPY NEURO TREATMENT and Progress Note   Patient Name: Cynthia Underwood MRN: [id number] DOB:[birth year], 52 y.o., female Today's Date: 10/31/2022   PCP: Charlott Rakes, MD REFERRING PROVIDER: Alda Berthold, DO  END OF SESSION:  PT End of Session - 10/31/22 [id number]     Visit Number 10    Number of Visits 13   Plus eval   Date for PT Re-Evaluation 11/06/22   Due to delay in scheduling   Authorization Type UHC Medicare    Progress Note Due on Visit 20    PT Start Time 1147    PT Stop Time 1230    PT Time Calculation (min) 43 min    Equipment Utilized During Treatment --   Floatation devices as needed for safety   Activity Tolerance Patient tolerated treatment well    Behavior During Therapy [facility] for tasks assessed/performed             Past Medical History:  Diagnosis Date   Bifascicular block 10/02/2014   Dyspnea    Excess ear wax    Multinodular goiter    Pneumonia    Presence of permanent cardiac pacemaker    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   Sickle cell trait ([place])    Watery eyes    Left   Weakness of both legs    Past Surgical History:  Procedure Laterality Date   BREAST BIOPSY Right    EYE SURGERY Left 2017   "related to blockage in my nose"   EYE SURGERY Right 05/2019   INSERT / REPLACE / REMOVE PACEMAKER  02/15/2017   PACEMAKER IMPLANT N/A 02/15/2017   Procedure: Pacemaker Implant;  Surgeon: Deboraha Sprang, MD;  Location: [place] [facility];  Service: Cardiovascular;  Laterality: N/A;   THYROIDECTOMY N/A 04/21/2021   Procedure: TOTAL THYROIDECTOMY;  Surgeon: Ralene Ok, MD;  Location: [place];  Service: General;  Laterality: N/A;   Patient Active Problem List   Diagnosis Date Noted   Hypothyroidism 11/07/2021   Thyroid nodule 04/21/2021   S/P total thyroidectomy 04/21/2021   Sinus node dysfunction ([place]) 03/10/2020   Weakness of both legs    Watery eyes    Sickle cell trait  ([place])    Presence of permanent cardiac pacemaker    Excess ear wax    Depression    Anemia    Cardiac pacemaker in situ 03/01/2017   Mobitz type 2 second degree heart block 02/15/2017   Symptomatic advanced heart block 02/12/2017   SOB (shortness of breath) 10/02/2014   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   Bifascicular block 10/02/2014   Infection due to trichomonas vaginalis 09/15/2014   Myotonic dystrophy, 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    Pt accompanied by: self  PERTINENT HISTORY: Starting around the age of 4, she started having lock jaw and stiffness of the hands.  Over the years, she developed worsening stiffness of her fingers and hands.  Because her maternal aunt, who is also a patient of mine, had known myotonic dystrophy, she ultimately was genetically tested at the age of 61 which confirmed the diagnosis of myotonic dystrophy type 1. She has a strong family history of DM1 including maternal aunt, cousins x 2, and younger sister.  She does not have any children and has no future plans for pregnancy.   Symptoms were relatively stable until her mid-30s and then started developing fatigue, weakness, daytime sleepiness, and shortness of breath with exertion.  She walks independently but was told previously told to use leg braces.   Over the past few years, she noticed intermittent difficulty swallowing liquids. She underwent barium swallow which showed signs of aspiration and she was recommended to use a straw and use chin tuck position.     She was seeing several neurologists over the years at [facility] in [place].  She is not working and has been on disability since 2010.  PAIN:  Are you having pain? No Pt reports frequently having pain in legs, worse during day   PRECAUTIONS: Fall and ICD/Pacemaker  WEIGHT BEARING RESTRICTIONS: No  FALLS: Has patient fallen in last 6 months? Yes. Number of falls at least 1  LIVING ENVIRONMENT: Lives with: lives with their family Lives in: House/apartment on 2nd floor Stairs: Yes: Internal: 14 steps; on left going up Has following equipment at home: Quad cane large base, Environmental consultant - 2 wheeled, and Environmental consultant - 4 wheeled  PLOF: Independent with basic ADLs and uses AD occasionally   PATIENT GOALS: "My legs getting stronger without the pain"  OBJECTIVE:   COGNITION: Overall cognitive status: Difficulty to assess due to: no family present   SENSATION: Pt denies numbness/tingling in [place]: hypotonic bilaterally   POSTURE: rounded shoulders   LOWER EXTREMITY MMT:  tested in seated position   MMT Right Eval Left Eval  Hip flexion 3- 3  Hip extension    Hip abduction 3+ 3+  Hip adduction 4- 4-  Hip internal rotation    Hip external rotation    Knee flexion 3+ 3  Knee extension 3+ 3  Ankle dorsiflexion 3 3  Ankle plantarflexion    Ankle inversion    Ankle eversion    (Blank rows = not tested)    TODAY'S TREATMENT:        Patient seen for aquatic therapy today.  Treatment took place in water 3.6-4.0 feet deep depending upon activity.  Pt entered and exited the pool via stairs using B rails in alternating fashion.    In approx 4' dept, had pt ambulate approx 18' x 2 laps forwards without support, backwards x 2 laps without support,  marching forwards x 2 laps holding small dumbbells moving in alt pattern, side stepping with squat and moving arms from abd (when legs in abd) to add when legs adduct with small dumbbells x 2 laps.  Pt doing better today with slow controlled movements.    NMR:  Standing holding noodle today for less support; moving RLE into flex, then abd, and finally ext (with knee ext and without touching floor)  x10 reps on each side.  Pt with  mild LOB but able to self correct.  Added ankle fins and performed single LE circles clockwise x 10 reps then counterclockwise x 10 reps on each side.  Standing on square pool noodle with feet shoulder width apart maintaining balance x 2 sets of 20 secs needing intermittent support from PT.  Note that she tends to have increased R lateral lean today and needs tactile and verbal cues for increased L lateral weight shift.   Sitting "saddle style" on yellow pool noodle, maintaining balance x 2 sets of 20 secs, paddling to end of pool x 18' with min/guard only today and cues for more upright posture as she tends to lean forward too much.   Continue to attempt "swing style" sitting on noodle.  This was more difficult but she is able to maintain position x 10-15 secs x 2-3 reps with CGA to min A  Holding to side of pool with PT assisting at trunk to maintain horizontal position, leg kicks x 20 reps x 2 sets.  Added floatation belt and PT assisted again at trunk for safety, having pt "doggie paddle" across pool x 4 laps.   Cues for breathing throughout but tolerated well.       Pt requires buoyancy of water for support for reduced fall risk, viscosity of water is needed for resistance for strengthening and current of water provides perturbations for challenge for balance training.                                                                                                                              PATIENT EDUCATION: Education details: reasoning for exercises in pool, schedule  going forward  Person educated: Patient Education method: Explanation, Demonstration, Tactile cues, and Verbal cues Education comprehension: needs further education  HOME EXERCISE PROGRAM: To be established   GOALS: Goals reviewed with patient? Yes  SHORT TERM GOALS: Target date: 10/10/2022    Floor transfer to be assessed and LTG written  Baseline: Goal status: INITIAL  2.  Pt will improve 5 x STS to less than or equal to 20 seconds w/BUE support to demonstrate improved functional strength and transfer efficiency.   Baseline: 23.34s w/BUE support  Goal status: INITIAL  3.  Pt will bring in AFOs for gait training in clinic  Baseline:  Goal status: INITIAL  4.  Pt will be independent with initial land HEP for improved strength, balance, transfers and gait.  Baseline: not established on eval  Goal status: INITIAL   LONG TERM GOALS: Target date: 11/07/2022   Floor transfer goal  Baseline:  Goal status: INITIAL  2.  Pt will be independent with final land and aquatic HEP for improved strength, balance, transfers and gait.  Baseline:  Goal status: INITIAL  3.  Pt will improve gait velocity to at least 3.0 ft/s w/LRAD mod I for improved gait efficiency and independence  Baseline: 2.75 ft/s without AD Goal status: INITIAL  4.  Pt will  improve 5 x STS to less than or equal to 16 seconds w/BUE support to demonstrate improved functional strength and transfer efficiency.   Baseline: 23.34s w/BUE support Goal status: INITIAL  Progress Note Reporting Period 09/18/22 to 10/31/22  See note below for Objective Data and Assessment of Progress/Goals.      ASSESSMENT:  CLINICAL IMPRESSION: Session done in pool today and utilized buoyancy to reduce fall risk and viscosity of water for resistance and strength training.  Emphasized balance and especially strength exercises with decreasing UE support.  Pt continues to demo improved strength and tolerating more time in SLS with  less external support.    OBJECTIVE IMPAIRMENTS: Abnormal gait, decreased activity tolerance, decreased balance, decreased cognition, decreased coordination, decreased endurance, decreased knowledge of condition, decreased knowledge of use of DME, decreased mobility, difficulty walking, decreased strength, decreased safety awareness, increased muscle spasms, impaired tone, postural dysfunction, and pain.   ACTIVITY LIMITATIONS: carrying, lifting, standing, squatting, sleeping, stairs, locomotion level, and caring for others  PARTICIPATION LIMITATIONS: laundry, driving, shopping, community activity, occupation, and yard work  PERSONAL FACTORS: Behavior pattern, Education, Fitness, Past/current experiences, Transportation, and 1 comorbidity: myotonic dystrophy type 1  are also affecting patient's functional outcome.   REHAB POTENTIAL: Fair due to poor compliance w/therapy in past and lack of social support  CLINICAL DECISION MAKING: Evolving/moderate complexity  EVALUATION COMPLEXITY: Moderate  PLAN:  PT FREQUENCY: 2x/week (1x on land and 1x aquatic)   PT DURATION: 6 weeks  PLANNED INTERVENTIONS: Therapeutic exercises, Therapeutic activity, Neuromuscular re-education, Balance training, Gait training, Patient/Family education, Self Care, Joint mobilization, Stair training, Vestibular training, Canalith repositioning, Orthotic/Fit training, DME instructions, Aquatic Therapy, Dry Needling, Manual therapy, and Re-evaluation  PLAN FOR NEXT SESSION: She is doing much better, I think we can maybe transition her out of the pool, what are your thoughts?  How did she tolerate the pool? Did she bring AFOs? HEP for BLE strength, walking program, assess floor transfer    Cameron Sprang, PT, MPT [facility] [address] [place], [place], [address] Phone: [phone]   Fax:  [phone] 10/31/22, 2:28 PM

## 2022-10-31 NOTE — Progress Notes (Signed)
No egg or soy allergy known to patient  No issues known to pt with past sedation with any surgeries or procedures Patient denies ever being told they had issues or difficulty with intubation  No issue moving head or neck or swallowing No FH of Malignant Hyperthermia Pt is not on diet pills Pt is not on  home 02  Pt is not on blood thinners  Pt denies issues with constipation  Pt is not on dialysis Pt denies any upcoming cardiac testing Pt encouraged to use to use Singlecare or Goodrx to reduce cost  Patient's chart reviewed by Osvaldo Angst CNRA prior to previsit and patient appropriate for the [place].  Previsit completed and red dot placed by patient's name on their procedure day (on provider's schedule).  . Visit by phone Instructions reviewed with pt and pt states understanding. Instructed to review again prior to procedure. Pt states they will.

## 2022-10-31 NOTE — Telephone Encounter (Signed)
Attempted to reach pt for pre-visit appt. LM with call back #.

## 2022-11-01 ENCOUNTER — Other Ambulatory Visit: Payer: Self-pay | Admitting: Family Medicine

## 2022-11-02 ENCOUNTER — Ambulatory Visit: Payer: 59 | Admitting: Physical Therapy

## 2022-11-02 NOTE — Telephone Encounter (Signed)
Requested medication (s) are due for refill today: yes  Requested medication (s) are on the active medication list: no  Last refill:  10/09/19  Future visit scheduled: yes  Notes to clinic:  Unable to refill per protocol, cannot delegate. Last refilled by another provider.       Requested Prescriptions  Pending Prescriptions Disp Refills   hydrocortisone (ANUSOL-HC) 2.5 % rectal cream [Pharmacy Med Name: HYDROCORTISONE 2.5% RECTAL CRM] 30 g 3    Sig: PLACE 1 APPLICATION RECTALLY 2 (TWO) TIMES DAILY.     Off-Protocol Failed - 11/01/2022  1:28 PM      Failed - Medication not assigned to a protocol, review manually.      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[facility])   [place] Charlott Rakes, MD   1 year ago Encounter for [employer] annual wellness exam   [place], MD   2 years ago Goiter diffuse   [place] Jodi Marble, MD       Future Appointments             In 6 days Charlott Rakes, MD [place]           Not Delegated - Over the Counter: OTC 2 Failed - 11/01/2022  1:28 PM      Failed - This refill cannot be delegated      Passed - Valid encounter within last 12 months    Recent Outpatient Visits           4 weeks ago Sessile colonic polyp   [place], Enobong, MD   9 months ago Postoperative hypothyroidism   [place], Enobong, MD   12 months ago Myotonic dystrophy, 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

## 2022-11-07 ENCOUNTER — Ambulatory Visit: Payer: 59 | Attending: Family Medicine | Admitting: Rehabilitation

## 2022-11-07 ENCOUNTER — Encounter: Payer: Self-pay | Admitting: Rehabilitation

## 2022-11-07 DIAGNOSIS — R2689 Other abnormalities of gait and mobility: Secondary | ICD-10-CM | POA: Insufficient documentation

## 2022-11-07 DIAGNOSIS — R29898 Other symptoms and signs involving the musculoskeletal system: Secondary | ICD-10-CM | POA: Diagnosis not present

## 2022-11-07 DIAGNOSIS — R2681 Unsteadiness on feet: Secondary | ICD-10-CM | POA: Insufficient documentation

## 2022-11-07 DIAGNOSIS — M6281 Muscle weakness (generalized): Secondary | ICD-10-CM | POA: Diagnosis not present

## 2022-11-07 NOTE — Therapy (Signed)
OUTPATIENT PHYSICAL THERAPY NEURO TREATMENT    Patient Name: Cynthia Underwood MRN: [id number] DOB:11/18/1970, 52 y.o., female Today's Date: 11/07/2022   PCP: Charlott Rakes, MD REFERRING PROVIDER: Alda [place], DO  END OF SESSION:  PT End of Session - 11/07/22 0833     Visit Number 11    Number of Visits 13   Plus eval   Date for PT Re-Evaluation 11/06/22   Due to delay in scheduling   Authorization Type UHC Medicare    Progress Note Due on Visit 20    PT Start Time 1146    PT Stop Time 1230    PT Time Calculation (min) 44 min    Equipment Utilized During Treatment --   Floatation devices as needed for safety   Activity Tolerance Patient tolerated treatment well    Behavior During Therapy WFL for tasks assessed/performed             Past Medical History:  Diagnosis Date   Bifascicular block 10/02/2014   Cataract    Dyspnea    Excess ear wax    Multinodular goiter    Pneumonia    Presence of permanent cardiac pacemaker    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   Sickle cell trait ([place])    Watery eyes    Left   Weakness of both legs    Past Surgical History:  Procedure Laterality Date   BREAST BIOPSY Right    EYE SURGERY Left 2017   "related to blockage in my nose"   EYE SURGERY Right 05/2019   INSERT / REPLACE / REMOVE PACEMAKER  02/15/2017   PACEMAKER IMPLANT N/A 02/15/2017   Procedure: Pacemaker Implant;  Surgeon: Deboraha Sprang, MD;  Location: [place] [facility];  Service: Cardiovascular;  Laterality: N/A;   THYROIDECTOMY N/A 04/21/2021   Procedure: TOTAL THYROIDECTOMY;  Surgeon: Ralene Ok, MD;  Location: [place];  Service: General;  Laterality: N/A;   Patient Active Problem List   Diagnosis Date Noted   Hypothyroidism 11/07/2021   Thyroid nodule 04/21/2021   S/P total thyroidectomy 04/21/2021   Sinus node dysfunction ([place]) 03/10/2020   Weakness of both legs    Watery eyes    Sickle cell trait ([place])     Presence of permanent cardiac pacemaker    Excess ear wax    Depression    Anemia    Cardiac pacemaker in situ 03/01/2017   Mobitz type 2 second degree heart block 02/15/2017   Symptomatic advanced heart block 02/12/2017   SOB (shortness of breath) 10/02/2014   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   Bifascicular block 10/02/2014   Infection due to trichomonas vaginalis 09/15/2014   Myotonic dystrophy, 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      Pt accompanied by: self  PERTINENT HISTORY: Starting around the age of 29, she started having lock jaw and stiffness of the hands.  Over the years, she developed worsening stiffness of her fingers and hands.  Because her maternal aunt, who is also a patient of mine, had known myotonic dystrophy, she ultimately was genetically tested at the age of 76 which confirmed the diagnosis of myotonic dystrophy type 1. She has a strong family history of DM1 including maternal aunt, cousins x 2, and younger sister.  She does not have any children and has no future plans for pregnancy.   Symptoms were relatively stable until her mid-30s and then started developing fatigue, weakness, daytime sleepiness, and shortness of breath with exertion.  She walks independently but was told previously told to use leg braces.  Over the past few years, she  noticed intermittent difficulty swallowing liquids. She underwent barium swallow which showed signs of aspiration and she was recommended to use a straw and use chin tuck position.     She was seeing several neurologists over the years at [facility] in [place].  She is not working and has been on disability since 2010.  PAIN:  Are you having pain? No Pt reports frequently having pain in legs, worse during day   PRECAUTIONS: Fall and ICD/Pacemaker  WEIGHT BEARING RESTRICTIONS: No  FALLS: Has patient fallen in last 6 months? Yes. Number of falls at least 1  LIVING ENVIRONMENT: Lives with: lives with their family Lives in: House/apartment on 2nd floor Stairs: Yes: Internal: 14 steps; on left going up Has following equipment at home: Quad cane large base, Environmental consultant - 2 wheeled, and Environmental consultant - 4 wheeled  PLOF: Independent with basic ADLs and uses AD occasionally   PATIENT GOALS: "My legs getting stronger without the pain"  OBJECTIVE:   COGNITION: Overall cognitive status: Difficulty to assess due to: no family present   SENSATION: Pt denies numbness/tingling in [place]: hypotonic bilaterally   POSTURE: rounded shoulders   LOWER EXTREMITY MMT:  tested in seated position   MMT Right Eval Left Eval  Hip flexion 3- 3  Hip extension    Hip abduction 3+ 3+  Hip adduction 4- 4-  Hip internal rotation    Hip external rotation    Knee flexion 3+ 3  Knee extension 3+ 3  Ankle dorsiflexion 3 3  Ankle plantarflexion    Ankle inversion    Ankle eversion    (Blank rows = not tested)    TODAY'S TREATMENT:        Patient seen for aquatic therapy today.  Treatment took place in water 3.6-4.0 feet deep depending upon activity.  Pt entered and exited the pool via stairs using B rails in alternating fashion.    In approx 4' dept, had pt ambulate approx 18' x 2 laps forwards without support, backwards x 2 laps without support, side stepping x 2 laps.     Marching forwards x 2 laps holding small dumbbells moving in alt pattern, side stepping with squat and moving arms from abd (when legs in abd) to add when legs adduct with small dumbbells x 2 laps.  Min cues for slower motions.    NMR:  Standing holding smaller noodle today for less support; moving RLE into flex, then abd, and finally ext (with knee ext and without touching floor)  x10 reps on each side.  Pt with mild LOB but able to self correct.  Performed single LE circles clockwise x 10 reps then counterclockwise x 10 reps on each side, again with smaller noodle.  Standing on square pool noodle with feet shoulder width apart maintaining balance x 2 sets of 20 secs needing intermittent support from PT/wall.  Standing on noodle in tandem stance x 2 mins with intermittent bouts of letting go for up to 5 secs.    Sit<>stand with pt sitting on 3rd step, feet on 2nd step (knees slightly above hips) x 10 reps with tactile cues initially for increased forward trunk lean and weight shift but able to do at S level following that.    Ai Chi postures for core strengthening and balance:  "Enclosing" and " Accepting" with tactile cues for forward/backwards weight shift in Accepting posture.  Performed both x 10 reps.   Jogging forwards x 2 laps, jogging backwards x 2 laps, side shuffle x 2 laps all without UE support and no LOB.  Alt step jumps on bottom step x 10 reps and single UE support.    Utilized small paddle board for UE support while performing LE kicks to opposite end of pool x 2 laps with light support from PT at pelvis but note much less support needed today.      Pt requires buoyancy of water for support for reduced fall risk, viscosity of water is needed for resistance for strengthening and current of water provides perturbations for challenge for balance training.                                                                                                                               PATIENT EDUCATION: Education details: reasoning for exercises in pool, schedule going forward  Person educated: Patient Education method: Explanation, Demonstration, Tactile cues, and Verbal cues Education comprehension: needs further education  HOME EXERCISE PROGRAM: To be established   GOALS: Goals reviewed with patient? Yes  SHORT TERM GOALS: Target date: 10/10/2022    Floor transfer to be assessed and LTG written  Baseline: Goal status: INITIAL  2.  Pt will improve 5 x STS to less than or equal to 20 seconds w/BUE support to demonstrate improved functional strength and transfer efficiency.   Baseline: 23.34s w/BUE support  Goal status: INITIAL  3.  Pt will bring in AFOs for gait training in clinic  Baseline:  Goal status: INITIAL  4.  Pt will be independent with initial land HEP for improved strength, balance, transfers and gait.  Baseline: not established on eval  Goal status: INITIAL   LONG TERM GOALS: Target date: 11/07/2022   Floor transfer goal  Baseline:  Goal status: INITIAL  2.  Pt will be independent with final land and aquatic HEP for improved strength, balance, transfers and gait.  Baseline:  Goal status: INITIAL  3.  Pt will improve gait velocity to at least 3.0 ft/s w/LRAD mod I for improved  gait efficiency and independence  Baseline: 2.75 ft/s without AD Goal status: INITIAL  4.  Pt will improve 5 x STS to less than or equal to 16 seconds w/BUE support to demonstrate improved functional strength and transfer efficiency.   Baseline: 23.34s w/BUE support Goal status: INITIAL  Progress Note Reporting Period 09/18/22 to 10/31/22  See note below for Objective Data and Assessment of Progress/Goals.      ASSESSMENT:  CLINICAL IMPRESSION: Session done in pool today and utilized buoyancy to reduce fall risk and viscosity of water for resistance and strength training.  Pt continues to demonstrate improved balance, able to decrease amount of UE  support, increase challenge of tasks with increased speed/resistance and able to incorporate some plyometric tasks to session.     OBJECTIVE IMPAIRMENTS: Abnormal gait, decreased activity tolerance, decreased balance, decreased cognition, decreased coordination, decreased endurance, decreased knowledge of condition, decreased knowledge of use of DME, decreased mobility, difficulty walking, decreased strength, decreased safety awareness, increased muscle spasms, impaired tone, postural dysfunction, and pain.   ACTIVITY LIMITATIONS: carrying, lifting, standing, squatting, sleeping, stairs, locomotion level, and caring for others  PARTICIPATION LIMITATIONS: laundry, driving, shopping, community activity, occupation, and yard work  PERSONAL FACTORS: Behavior pattern, Education, Fitness, Past/current experiences, Transportation, and 1 comorbidity: myotonic dystrophy type 1  are also affecting patient's functional outcome.   REHAB POTENTIAL: Fair due to poor compliance w/therapy in past and lack of social support  CLINICAL DECISION MAKING: Evolving/moderate complexity  EVALUATION COMPLEXITY: Moderate  PLAN:  PT FREQUENCY: 2x/week (1x on land and 1x aquatic)   PT DURATION: 6 weeks  PLANNED INTERVENTIONS: Therapeutic exercises, Therapeutic activity, Neuromuscular re-education, Balance training, Gait training, Patient/Family education, Self Care, Joint mobilization, Stair training, Vestibular training, Canalith repositioning, Orthotic/Fit training, DME instructions, Aquatic Therapy, Dry Needling, Manual therapy, and Re-evaluation  PLAN FOR NEXT SESSION: She is doing much better, I think we can maybe transition her out of the pool, what are your thoughts?  How did she tolerate the pool? Did she bring AFOs? HEP for BLE strength, walking program, assess floor transfer    Cameron Sprang, PT, MPT [facility] [address] [place], [place], [address] Phone:  [phone]   Fax:  [phone] 11/07/22, 2:43 PM

## 2022-11-08 ENCOUNTER — Other Ambulatory Visit (HOSPITAL_COMMUNITY)
Admission: RE | Admit: 2022-11-08 | Discharge: 2022-11-08 | Disposition: A | Discharge status: Home / Self Care | Payer: 59 | Source: Ambulatory Visit | Attending: Family Medicine | Admitting: Family Medicine

## 2022-11-08 ENCOUNTER — Encounter: Payer: Self-pay | Admitting: Family Medicine

## 2022-11-08 ENCOUNTER — Ambulatory Visit: Payer: 59 | Attending: Family Medicine | Admitting: Family Medicine

## 2022-11-08 ENCOUNTER — Encounter: Payer: 59 | Admitting: Gastroenterology

## 2022-11-08 VITALS — BP 108/72 | HR 88 | Ht 63.0 in | Wt 98.6 lb

## 2022-11-08 DIAGNOSIS — Z23 Encounter for immunization: Secondary | ICD-10-CM | POA: Diagnosis not present

## 2022-11-08 DIAGNOSIS — Z950 Presence of cardiac pacemaker: Secondary | ICD-10-CM | POA: Diagnosis not present

## 2022-11-08 DIAGNOSIS — Z681 Body mass index (BMI) 19 or less, adult: Secondary | ICD-10-CM | POA: Diagnosis not present

## 2022-11-08 DIAGNOSIS — Z01419 Encounter for gynecological examination (general) (routine) without abnormal findings: Secondary | ICD-10-CM | POA: Insufficient documentation

## 2022-11-08 DIAGNOSIS — D573 Sickle-cell trait: Secondary | ICD-10-CM

## 2022-11-08 DIAGNOSIS — Z113 Encounter for screening for infections with a predominantly sexual mode of transmission: Secondary | ICD-10-CM

## 2022-11-08 DIAGNOSIS — Z124 Encounter for screening for malignant neoplasm of cervix: Secondary | ICD-10-CM

## 2022-11-08 DIAGNOSIS — Z1151 Encounter for screening for human papillomavirus (HPV): Secondary | ICD-10-CM | POA: Diagnosis not present

## 2022-11-08 NOTE — Progress Notes (Signed)
Subjective:  Patient ID: Cynthia Underwood, female    DOB: 1970/10/21  Age: 52 y.o. MRN: [id number]  CC: Gynecologic Exam   HPI Cynthia Underwood is a 52 y.o. year old female with a history of myotonic dystrophy complicated by cardiac conduction abnormalities including second-degree heart s/p pacemaker placement, postop hypothyroidism status post subtotal thyroidectomy (for Goiter)   Interval History:  She presents for gynecologic exam today and is due for Pap smear.  Mammogram had been previously ordered and she rescheduled for this.  Also has an appointment for colonoscopy in 2 weeks (she had history of sessile colonic polyps on last colonoscopy). She would like an STD test as well today. She had a visit for chronic disease management 1 month ago. Denies presence of additional concerns. Past Medical History:  Diagnosis Date   Bifascicular block 10/02/2014   Cataract    Dyspnea    Excess ear wax    Multinodular goiter    Pneumonia    Presence of permanent cardiac pacemaker    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   Sickle cell trait ([place])    Watery eyes    Left   Weakness of both legs     Past Surgical History:  Procedure Laterality Date   BREAST BIOPSY Right    EYE SURGERY Left 2017   "related to blockage in my nose"   EYE SURGERY Right 05/2019   INSERT / REPLACE / REMOVE PACEMAKER  02/15/2017   PACEMAKER IMPLANT N/A 02/15/2017   Procedure: Pacemaker Implant;  Surgeon: Deboraha Sprang, MD;  Location: [place] [facility];  Service: Cardiovascular;  Laterality: N/A;   THYROIDECTOMY N/A 04/21/2021   Procedure: TOTAL THYROIDECTOMY;  Surgeon: Ralene Ok, MD;  Location: [place];  Service: General;  Laterality: N/A;    Family History  Problem Relation Age of Onset   Cancer Father 26       Deceased   Heart disease Father    COPD Mother    Diabetes Maternal Uncle    Heart disease Maternal Uncle    Heart disease Paternal  Grandmother    Diabetes Paternal Grandmother    Hypertension Paternal Grandmother    Neuromuscular disorder Maternal Aunt        Myotonic dystrophy   Neuromuscular disorder Cousin        Myotonic dystrophy   Neuromuscular disorder Sister        Myotonic dystrophy   Colon cancer Neg Hx    Colon polyps Neg Hx    Esophageal cancer Neg Hx    Rectal cancer Neg Hx    Stomach cancer Neg Hx     Social History   Socioeconomic History   Marital status: Single    Spouse name: Not on file   Number of children: 0   Years of education: 1.5 Collge   Highest education level: Not on file  Occupational History   Occupation: Unemployed    Employer: OTHER  Tobacco Use   Smoking status: Every Day    Years: 28.00    Types: Cigarettes    Last attempt to quit: 10/04/2016    Years since quitting: 6.0   Smokeless tobacco: Never   Tobacco comments:    3 cigarettes a day  Vaping Use   Vaping Use: Never used  Substance and Sexual Activity   Alcohol use: Not Currently    Alcohol/week: 0.0 standard drinks of alcohol    Comment:  few times a year  Drug use: Not Currently    Types: Marijuana    Comment: occ    Sexual activity: Not Currently    Birth control/protection: None, Condom  Other Topics Concern   Not on file  Social History Narrative   Patient lives at home with her aunt.   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  She moved to [facility] in August 2015.   She has been on disability since 2010.   Education: 1 1/2 years of college.   Caffeine - coke 2 cans/day   Exercise - no   Social Determinants of Health   Financial Resource Strain: Low Risk  (10/16/2022)   Overall Financial Resource Strain (CARDIA)    Difficulty of Paying Living Expenses: Not hard at all  Food Insecurity: No Food Insecurity (10/16/2022)   Hunger Vital Sign    Worried About Running Out of Food in the Last Year: Never true    Ran Out of Food in the Last Year: Never true  Transportation Needs:  No Transportation Needs (10/16/2022)   PRAPARE - Hydrologist (Medical): No    Lack of Transportation (Non-Medical): No  Physical Activity: Inactive (10/16/2022)   Exercise Vital Sign    Days of Exercise per Week: 0 days    Minutes of Exercise per Session: 0 min  Stress: Stress Concern Present (10/16/2022)   [place]    Feeling of Stress : To some extent  Social Connections: Socially Isolated (10/16/2022)   Social Connection and Isolation Panel [NHANES]    Frequency of Communication with Friends and Family: Three times a week    Frequency of Social Gatherings with Friends and Family: Three times a week    Attends Religious Services: Never    Active Member of Clubs or Organizations: No    Attends Archivist Meetings: Never    Marital Status: Never married    Allergies  Allergen Reactions   Penicillin G Other (See Comments), Rash and Nausea And Vomiting    Sore throat   Penicillins Nausea And Vomiting, Rash and Other (See Comments)    Fever Has patient had a PCN reaction causing immediate rash, facial/tongue/throat swelling, SOB or lightheadedness with hypotension: Yes Has patient had a PCN reaction causing severe rash involving mucus membranes or skin necrosis: Unknown Has patient had a PCN reaction that required hospitalization: No Has patient had a PCN reaction occurring within the last 10 years: No If all of the above answers are "NO", then may proceed with Cephalosporin use.     Outpatient Medications Prior to Visit  Medication Sig Dispense Refill   albuterol (VENTOLIN HFA) 108 (90 Base) MCG/ACT inhaler INHALE 2 PUFFS INTO THE LUNGS EVERY 4 HOURS AS NEEDED FOR WHEEZING OR SHORTNESS OF BREATH 18 g 1   hydrocortisone (ANUSOL-HC) 2.5 % rectal cream PLACE 1 APPLICATION RECTALLY 2 (TWO) TIMES DAILY. 30 g 3   levothyroxine (SYNTHROID) 112 MCG tablet Take 1 tablet (112 mcg total)  by mouth daily before breakfast. 90 tablet 1   Misc. Devices MISC TubTransfer bench. Diagnosis - myotonic dystrophy 1 each 0   No facility-administered medications prior to visit.     ROS Review of Systems  Constitutional:  Negative for activity change and appetite change.  HENT:  Negative for sinus pressure and sore throat.   Respiratory:  Negative for chest tightness, shortness of breath and wheezing.   Cardiovascular:  Negative for chest pain  and palpitations.  Gastrointestinal:  Negative for abdominal distention, abdominal pain and constipation.  Genitourinary: Negative.   Musculoskeletal: Negative.   Psychiatric/Behavioral:  Negative for behavioral problems and dysphoric mood.    Objective:  BP 108/72   Pulse 88   Ht '5\' 3"'$  (1.6 m)   Wt 98 lb 9.6 oz (44.7 kg)   LMP 12/03/2016 (Approximate)   SpO2 94%   BMI 17.47 kg/m      11/08/2022    2:22 PM 10/31/2022    3:02 PM 10/25/2022    3:15 PM  BP/Weight  Systolic BP [id number]    Diastolic BP 72    Wt. (Lbs) 98.6 103 168  BMI 17.47 kg/m2 18.25 kg/m2 28.84 kg/m2      Physical Exam Constitutional:      Appearance: She is well-developed.  Cardiovascular:     Rate and Rhythm: Normal rate.     Heart sounds: Normal heart sounds. No murmur heard. Pulmonary:     Effort: Pulmonary effort is normal.     Breath sounds: Normal breath sounds. No wheezing or rales.  Chest:     Chest wall: No tenderness.  Abdominal:     General: Bowel sounds are normal. There is no distension.     Palpations: Abdomen is soft. There is no mass.     Tenderness: There is no abdominal tenderness.  Genitourinary:    Comments: External genitalia, vagina, cervix, adnexa-normal Musculoskeletal:        General: Normal range of motion.     Right lower leg: No edema.     Left lower leg: No edema.  Neurological:     Mental Status: She is alert and oriented to person, place, and time.  Psychiatric:        Mood and Affect: Mood normal.        Latest Ref  Rng & Units 10/04/2022    4:38 PM 01/11/2022    4:13 PM 04/22/2021    2:28 AM  CMP  Glucose 70 - 99 mg/dL 77  80  137   BUN 6 - 24 mg/dL '12  11  5   '$ Creatinine 0.57 - 1.00 mg/dL 0.61  0.59  0.42   Sodium 134 - 144 mmol/L 143  146  141   Potassium 3.5 - 5.2 mmol/L 3.7  3.3  3.5   Chloride 96 - 106 mmol/L 105  107  106   CO2 20 - 29 mmol/L '25  24  28   '$ Calcium 8.7 - 10.2 mg/dL 9.4  9.5  8.7   Total Protein 6.0 - 8.5 g/dL 7.1  6.8    Total Bilirubin 0.0 - 1.2 mg/dL 0.5  0.3    Alkaline Phos 44 - 121 IU/L 42  46    AST 0 - 40 IU/L 26  22    ALT 0 - 32 IU/L 15  13      Lipid Panel     Component Value Date/Time   CHOL 214 (H) 01/10/2018 0847   TRIG 113 01/10/2018 0847   HDL 57 01/10/2018 0847   CHOLHDL 3.8 01/10/2018 0847   CHOLHDL 3.5 08/26/2014 1516   VLDL 20 08/26/2014 1516   LDLCALC 134 (H) 01/10/2018 0847    CBC    Component Value Date/Time   WBC 5.5 10/04/2022 1638   WBC 7.7 03/22/2021 1140   RBC 3.99 10/04/2022 1638   RBC 4.17 03/22/2021 1140   HGB 12.6 10/04/2022 1638   HCT 37.6 10/04/2022 1638   PLT 192  10/04/2022 1638   MCV 94 10/04/2022 1638   MCH 31.6 10/04/2022 1638   MCH 29.7 03/22/2021 1140   MCHC 33.5 10/04/2022 1638   MCHC 30.5 03/22/2021 1140   RDW 15.1 10/04/2022 1638   LYMPHSABS 2.1 10/04/2022 1638   MONOABS 0.6 08/27/2018 2308   EOSABS 0.1 10/04/2022 1638   BASOSABS 0.0 10/04/2022 1638    Lab Results  Component Value Date   HGBA1C 5.7 01/09/2017    Assessment & Plan:  1. Cardiac pacemaker in situ Secondary to sinus node dysfunction Stable Continue to follow-up with cardiology  2. Sickle-cell trait ([place]) Stable  3. Screening for cervical cancer - Cytology - PAP  4. Screening for STD (sexually transmitted disease) - Cervicovaginal ancillary only   Health Care Maintenance: Colonoscopy comes up later this month; she will schedule her mammogram  No orders of the defined types were placed in this encounter.   Follow-up: Return in  about 6 months (around 05/11/2023) for Chronic medical conditions.       Charlott Rakes, MD, FAAFP. [facility] and [place], [place]   11/08/2022, 2:47 PM

## 2022-11-08 NOTE — Patient Instructions (Signed)

## 2022-11-09 ENCOUNTER — Ambulatory Visit: Payer: 59 | Admitting: Occupational Therapy

## 2022-11-09 ENCOUNTER — Ambulatory Visit: Payer: 59 | Admitting: Physical Therapy

## 2022-11-09 LAB — CERVICOVAGINAL ANCILLARY ONLY
Bacterial Vaginitis (gardnerella): NEGATIVE
Candida Glabrata: NEGATIVE
Candida Vaginitis: NEGATIVE
Chlamydia: NEGATIVE
Comment: NEGATIVE
Comment: NEGATIVE
Comment: NEGATIVE
Comment: NEGATIVE
Comment: NEGATIVE
Comment: NORMAL
Neisseria Gonorrhea: NEGATIVE
Trichomonas: NEGATIVE

## 2022-11-09 NOTE — Therapy (Deleted)
OUTPATIENT OCCUPATIONAL THERAPY NEURO TREATMENT  Patient Name: Cynthia Underwood MRN: [id number] DOB:1971-03-22, 52 y.o., female Today's Date: 10/19/2022  PCP: Charlott Rakes, MD REFERRING PROVIDER: Alda Berthold, DO  END OF SESSION:  OT End of Session - 10/19/22 1608     Visit Number 3    Number of Visits 17   pt may only need 8 visits if coming 1x/wk, up to 17 visits if coming 2x/wk   Authorization Type U[employer] MCR primary, MCD secondary    Progress Note Due on Visit 10    OT Start Time 1607    Activity Tolerance Patient tolerated treatment well    Behavior During Therapy [facility] for tasks assessed/performed              Past Medical History:  Diagnosis Date   Bifascicular block 10/02/2014   Dyspnea    Excess ear wax    Multinodular goiter    Pneumonia    Presence of permanent cardiac pacemaker    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   Sickle cell trait ([place])    Watery eyes    Left   Weakness of both legs    Past Surgical History:  Procedure Laterality Date   BREAST BIOPSY Right    EYE SURGERY Left 2017   "related to blockage in my nose"   EYE SURGERY Right 05/2019   INSERT / REPLACE / REMOVE PACEMAKER  02/15/2017   PACEMAKER IMPLANT N/A 02/15/2017   Procedure: Pacemaker Implant;  Surgeon: Deboraha Sprang, MD;  Location: [place] [facility];  Service: Cardiovascular;  Laterality: N/A;   THYROIDECTOMY N/A 04/21/2021   Procedure: TOTAL THYROIDECTOMY;  Surgeon: Ralene Ok, MD;  Location: [place];  Service: General;  Laterality: N/A;   Patient Active Problem List   Diagnosis Date Noted   Hypothyroidism 11/07/2021   Thyroid nodule 04/21/2021   S/P total thyroidectomy 04/21/2021   Sinus node dysfunction ([place]) 03/10/2020   Weakness of both legs    Watery eyes    Sickle cell trait ([place])    Presence of permanent cardiac pacemaker    Excess ear wax    Depression    Anemia    Cardiac pacemaker in situ 03/01/2017    Mobitz type 2 second degree heart block 02/15/2017   Symptomatic advanced heart block 02/12/2017   SOB (shortness of breath) 10/02/2014   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   Bifascicular block 10/02/2014   Infection due to trichomonas vaginalis 09/15/2014   Myotonic dystrophy, 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   Pt accompanied by: self  PERTINENT HISTORY: right-handed [place] American female with myotonic dystrophy type I complicated by cardiac arryhtmia s/p PPM (June 2018), and hyperlipidemia   PRECAUTIONS: ICD/Pacemaker, NO heavy lifting  WEIGHT BEARING RESTRICTIONS: No  PAIN:  Are you having pain? No  FALLS: Has patient fallen in last 6 months? Yes. Number of falls 1  LIVING ENVIRONMENT: Lives with: lives with their family Lives in: house, 2 story with level entry Has following equipment at home:  AFO's  PLOF: Independent with basic ADLs  PATIENT GOALS:  increase strength in arms and hands  OBJECTIVE: (from evaluation unless otherwise noted)  HAND DOMINANCE: Right  ADLs: Overall ADLs: overall mod I Transfers/ambulation related to ADLs: Eating: mod I - difficulty cutting food Grooming: mod I  UB Dressing: mod I  LB Dressing: mod I  - leaves shoes untied Toileting: independent Bathing: independent Tub Shower transfers: step over tub/shower combo - feels unsteady, takes her time Equipment: none **pt reports difficulty opening containers, soda bottles  IADLs: Shopping: cousin goes normally for pt Light housekeeping: light housework (washing dishes,  takes garbage out, laundry)  Meal Prep: pt does not cook, but does snacks, sandwiches, heats up things in microwave or rarely heats up things on stove Community mobility: family or medical transport for transportation Medication management: forget the pm medicine  Handwriting: 100% legible  MOBILITY STATUS: Independent   UPPER EXTREMITY ROM:  BUE AROM WFL's but very weak   UPPER EXTREMITY MMT:   BUE grossly 3/5   HAND FUNCTION: Grip strength: Right: 21.3 lbs; Left: 20.9 lbs Lateral pinch: Rt = 4, Lt = 6 lbs 3 tip pinch: Rt = 5, Lt = 6  COORDINATION: 9 Hole Peg test: Right: 25.37 sec; Left: 29.66 sec  SENSATION: WFL  EDEMA: none  MUSCLE TONE: RUE: Hypotonic and LUE: Hypotonic  COGNITION: Overall cognitive status: Within functional limits for tasks assessed  VISION: Subjective report: ? Cataracts with surgery Baseline vision: Wears glasses all the time Visual history: cataracts  OBSERVATIONS: Pt reports increased decline in the last several months   TODAY'S TREATMENT:                                                                                                                               - Self-care/home management completed for duration as noted below including:  Patient completed opening of jars and lidded containers of various diameters for improved UE strength and incorporated functional use as well as bimanual coordination. OT educated pt on use of Dycem or shelf liner for added leverage and joint protection. Additionally, pt was educated on different AD for opening of containers.  OT educated pt on use of tub transfer bench including adjustments to bench. Pt verbalized understanding. Request for DME order sent to PCP. Pt encouraged to complete dry run with family present prior to shower completion for added safety.  Therapist reviewed goals with patient and updated patient progression.   PATIENT EDUCATION: Education details: DME, AD, home management Person  educated: Patient Education method: Explanation, Demonstration, and Handouts Education comprehension: verbalized understanding, returned demonstration, and needs further education  HOME EXERCISE PROGRAM: 2/8: B dowel and yellow putty HEPs   GOALS: Goals reviewed with patient? Yes  SHORT TERM GOALS: Target date: 10/19/22  Independent with initial HEP for BUE's Baseline: Goal status: INITIAL  2.  Pt to verbalize understanding of DME (tub bench) and A/E (jar & bottle openers, etc) that would increase safety, ease, and/or independence with ADLS Baseline:  Goal  status: MET  3.  Pt will verbalize strategy for remembering pm medicine Baseline:  Goal status: MET    LONG TERM GOALS: Target date: 11/17/22  Independent with updated HEP for BUE's Baseline:  Goal status: INITIAL  2.  Improve bilateral grip strength by 5 lbs to increase ease with opening jars/containers Baseline: Rt = 21 lbs, Lt = 20 lbs Goal status: INITIAL  3.  Pt to demo tub/shower transfer safely with tub bench prn Baseline:  Goal status: INITIAL   ASSESSMENT:  CLINICAL IMPRESSION: Pt good candidate for skilled OT services to improve BUE strength as needed for more safe and independent completion of ADLs and IADLs.   PERFORMANCE DEFICITS: in functional skills including ADLs, IADLs, coordination, tone, ROM, strength, pain, muscle spasms, Fine motor control, balance, body mechanics, endurance, cardiopulmonary status limiting function, decreased knowledge of use of DME, and UE functional use.   IMPAIRMENTS: are limiting patient from ADLs, IADLs, leisure, and social participation.   CO-MORBIDITIES: may have co-morbidities  that affects occupational performance. Patient will benefit from skilled OT to address above impairments and improve overall function.  REHAB POTENTIAL: Good  PLAN:  OT FREQUENCY: 1-2x/week  OT DURATION: up to 8 weeks (plus eval)  PLANNED INTERVENTIONS: self care/ADL training,  therapeutic exercise, therapeutic activity, neuromuscular re-education, manual therapy, passive range of motion, functional mobility training, aquatic therapy, moist heat, patient/family education, cognitive remediation/compensation, visual/perceptual remediation/compensation, coping strategies training, and DME and/or AE instructions  RECOMMENDED OTHER SERVICES: none at this time  CONSULTED AND AGREED WITH PLAN OF CARE: Patient  PLAN FOR NEXT SESSION: Review HEP (BUE cane HEP, and yellow putty HEP); Theraband HEP?; did she get tub transfer bench?   Dennis Bast, OT 10/19/2022, 4:09 PM

## 2022-11-11 ENCOUNTER — Encounter (HOSPITAL_COMMUNITY): Payer: Self-pay

## 2022-11-11 ENCOUNTER — Inpatient Hospital Stay (HOSPITAL_COMMUNITY)
Admission: EM | Admit: 2022-11-11 | Discharge: 2022-12-06 | DRG: 004 | Disposition: A | Discharge status: Other Acute Inpatient Hospital | Payer: 59 | Attending: Pulmonary Disease | Admitting: Pulmonary Disease

## 2022-11-11 ENCOUNTER — Emergency Department (HOSPITAL_COMMUNITY): Payer: 59

## 2022-11-11 ENCOUNTER — Other Ambulatory Visit: Payer: Self-pay

## 2022-11-11 DIAGNOSIS — F32A Depression, unspecified: Secondary | ICD-10-CM | POA: Diagnosis not present

## 2022-11-11 DIAGNOSIS — I5021 Acute systolic (congestive) heart failure: Secondary | ICD-10-CM

## 2022-11-11 DIAGNOSIS — Z79890 Hormone replacement therapy (postmenopausal): Secondary | ICD-10-CM

## 2022-11-11 DIAGNOSIS — I469 Cardiac arrest, cause unspecified: Secondary | ICD-10-CM | POA: Diagnosis not present

## 2022-11-11 DIAGNOSIS — I462 Cardiac arrest due to underlying cardiac condition: Secondary | ICD-10-CM | POA: Diagnosis not present

## 2022-11-11 DIAGNOSIS — Z1152 Encounter for screening for COVID-19: Secondary | ICD-10-CM

## 2022-11-11 DIAGNOSIS — G928 Other toxic encephalopathy: Secondary | ICD-10-CM | POA: Diagnosis not present

## 2022-11-11 DIAGNOSIS — K7200 Acute and subacute hepatic failure without coma: Secondary | ICD-10-CM | POA: Diagnosis not present

## 2022-11-11 DIAGNOSIS — G7281 Critical illness myopathy: Secondary | ICD-10-CM | POA: Diagnosis not present

## 2022-11-11 DIAGNOSIS — D638 Anemia in other chronic diseases classified elsewhere: Secondary | ICD-10-CM | POA: Diagnosis present

## 2022-11-11 DIAGNOSIS — R0789 Other chest pain: Secondary | ICD-10-CM | POA: Diagnosis not present

## 2022-11-11 DIAGNOSIS — K5900 Constipation, unspecified: Secondary | ICD-10-CM | POA: Diagnosis present

## 2022-11-11 DIAGNOSIS — I472 Ventricular tachycardia, unspecified: Secondary | ICD-10-CM | POA: Diagnosis not present

## 2022-11-11 DIAGNOSIS — J470 Bronchiectasis with acute lower respiratory infection: Secondary | ICD-10-CM | POA: Diagnosis present

## 2022-11-11 DIAGNOSIS — R6521 Severe sepsis with septic shock: Secondary | ICD-10-CM | POA: Diagnosis not present

## 2022-11-11 DIAGNOSIS — K2100 Gastro-esophageal reflux disease with esophagitis, without bleeding: Secondary | ICD-10-CM | POA: Diagnosis present

## 2022-11-11 DIAGNOSIS — I3139 Other pericardial effusion (noninflammatory): Secondary | ICD-10-CM | POA: Diagnosis not present

## 2022-11-11 DIAGNOSIS — E872 Acidosis, unspecified: Secondary | ICD-10-CM | POA: Diagnosis not present

## 2022-11-11 DIAGNOSIS — J9801 Acute bronchospasm: Secondary | ICD-10-CM | POA: Diagnosis present

## 2022-11-11 DIAGNOSIS — E43 Unspecified severe protein-calorie malnutrition: Secondary | ICD-10-CM | POA: Diagnosis not present

## 2022-11-11 DIAGNOSIS — R079 Chest pain, unspecified: Secondary | ICD-10-CM | POA: Diagnosis not present

## 2022-11-11 DIAGNOSIS — A4189 Other specified sepsis: Secondary | ICD-10-CM | POA: Diagnosis not present

## 2022-11-11 DIAGNOSIS — B9781 Human metapneumovirus as the cause of diseases classified elsewhere: Secondary | ICD-10-CM | POA: Diagnosis present

## 2022-11-11 DIAGNOSIS — R197 Diarrhea, unspecified: Secondary | ICD-10-CM | POA: Diagnosis not present

## 2022-11-11 DIAGNOSIS — J189 Pneumonia, unspecified organism: Principal | ICD-10-CM | POA: Diagnosis present

## 2022-11-11 DIAGNOSIS — K0889 Other specified disorders of teeth and supporting structures: Secondary | ICD-10-CM | POA: Diagnosis present

## 2022-11-11 DIAGNOSIS — J9601 Acute respiratory failure with hypoxia: Secondary | ICD-10-CM | POA: Diagnosis not present

## 2022-11-11 DIAGNOSIS — I5023 Acute on chronic systolic (congestive) heart failure: Secondary | ICD-10-CM | POA: Diagnosis not present

## 2022-11-11 DIAGNOSIS — E876 Hypokalemia: Secondary | ICD-10-CM | POA: Diagnosis not present

## 2022-11-11 DIAGNOSIS — R1310 Dysphagia, unspecified: Secondary | ICD-10-CM | POA: Diagnosis present

## 2022-11-11 DIAGNOSIS — E875 Hyperkalemia: Secondary | ICD-10-CM | POA: Diagnosis not present

## 2022-11-11 DIAGNOSIS — Z825 Family history of asthma and other chronic lower respiratory diseases: Secondary | ICD-10-CM

## 2022-11-11 DIAGNOSIS — I4721 Torsades de pointes: Secondary | ICD-10-CM | POA: Insufficient documentation

## 2022-11-11 DIAGNOSIS — K209 Esophagitis, unspecified without bleeding: Secondary | ICD-10-CM

## 2022-11-11 DIAGNOSIS — R531 Weakness: Secondary | ICD-10-CM | POA: Diagnosis not present

## 2022-11-11 DIAGNOSIS — Z681 Body mass index (BMI) 19 or less, adult: Secondary | ICD-10-CM

## 2022-11-11 DIAGNOSIS — R451 Restlessness and agitation: Secondary | ICD-10-CM | POA: Diagnosis not present

## 2022-11-11 DIAGNOSIS — E870 Hyperosmolality and hypernatremia: Secondary | ICD-10-CM | POA: Diagnosis not present

## 2022-11-11 DIAGNOSIS — Y95 Nosocomial condition: Secondary | ICD-10-CM | POA: Diagnosis present

## 2022-11-11 DIAGNOSIS — E1065 Type 1 diabetes mellitus with hyperglycemia: Secondary | ICD-10-CM | POA: Diagnosis not present

## 2022-11-11 DIAGNOSIS — Z880 Allergy status to penicillin: Secondary | ICD-10-CM

## 2022-11-11 DIAGNOSIS — Z8249 Family history of ischemic heart disease and other diseases of the circulatory system: Secondary | ICD-10-CM

## 2022-11-11 DIAGNOSIS — I4901 Ventricular fibrillation: Secondary | ICD-10-CM | POA: Diagnosis not present

## 2022-11-11 DIAGNOSIS — I5082 Biventricular heart failure: Secondary | ICD-10-CM | POA: Diagnosis not present

## 2022-11-11 DIAGNOSIS — R0902 Hypoxemia: Secondary | ICD-10-CM | POA: Diagnosis not present

## 2022-11-11 DIAGNOSIS — R443 Hallucinations, unspecified: Secondary | ICD-10-CM | POA: Diagnosis present

## 2022-11-11 DIAGNOSIS — J440 Chronic obstructive pulmonary disease with acute lower respiratory infection: Secondary | ICD-10-CM | POA: Diagnosis present

## 2022-11-11 DIAGNOSIS — I2510 Atherosclerotic heart disease of native coronary artery without angina pectoris: Secondary | ICD-10-CM | POA: Diagnosis present

## 2022-11-11 DIAGNOSIS — D6489 Other specified anemias: Secondary | ICD-10-CM | POA: Diagnosis present

## 2022-11-11 DIAGNOSIS — F411 Generalized anxiety disorder: Secondary | ICD-10-CM | POA: Diagnosis present

## 2022-11-11 DIAGNOSIS — I428 Other cardiomyopathies: Secondary | ICD-10-CM | POA: Diagnosis not present

## 2022-11-11 DIAGNOSIS — I442 Atrioventricular block, complete: Secondary | ICD-10-CM | POA: Diagnosis present

## 2022-11-11 DIAGNOSIS — J123 Human metapneumovirus pneumonia: Secondary | ICD-10-CM | POA: Diagnosis present

## 2022-11-11 DIAGNOSIS — I441 Atrioventricular block, second degree: Secondary | ICD-10-CM | POA: Diagnosis present

## 2022-11-11 DIAGNOSIS — Z950 Presence of cardiac pacemaker: Secondary | ICD-10-CM

## 2022-11-11 DIAGNOSIS — D573 Sickle-cell trait: Secondary | ICD-10-CM | POA: Diagnosis present

## 2022-11-11 DIAGNOSIS — J9811 Atelectasis: Secondary | ICD-10-CM | POA: Diagnosis present

## 2022-11-11 DIAGNOSIS — Z794 Long term (current) use of insulin: Secondary | ICD-10-CM

## 2022-11-11 DIAGNOSIS — D509 Iron deficiency anemia, unspecified: Secondary | ICD-10-CM | POA: Diagnosis present

## 2022-11-11 DIAGNOSIS — D75839 Thrombocytosis, unspecified: Secondary | ICD-10-CM | POA: Diagnosis not present

## 2022-11-11 DIAGNOSIS — G7111 Myotonic muscular dystrophy: Secondary | ICD-10-CM | POA: Diagnosis present

## 2022-11-11 DIAGNOSIS — F17210 Nicotine dependence, cigarettes, uncomplicated: Secondary | ICD-10-CM | POA: Diagnosis present

## 2022-11-11 DIAGNOSIS — Z79899 Other long term (current) drug therapy: Secondary | ICD-10-CM

## 2022-11-11 DIAGNOSIS — E039 Hypothyroidism, unspecified: Secondary | ICD-10-CM | POA: Diagnosis not present

## 2022-11-11 DIAGNOSIS — R059 Cough, unspecified: Secondary | ICD-10-CM | POA: Diagnosis not present

## 2022-11-11 DIAGNOSIS — L299 Pruritus, unspecified: Secondary | ICD-10-CM | POA: Diagnosis not present

## 2022-11-11 DIAGNOSIS — R410 Disorientation, unspecified: Secondary | ICD-10-CM | POA: Diagnosis not present

## 2022-11-11 DIAGNOSIS — R159 Full incontinence of feces: Secondary | ICD-10-CM | POA: Diagnosis present

## 2022-11-11 DIAGNOSIS — Z560 Unemployment, unspecified: Secondary | ICD-10-CM

## 2022-11-11 DIAGNOSIS — Z833 Family history of diabetes mellitus: Secondary | ICD-10-CM

## 2022-11-11 DIAGNOSIS — G4489 Other headache syndrome: Secondary | ICD-10-CM | POA: Diagnosis not present

## 2022-11-11 DIAGNOSIS — I5043 Acute on chronic combined systolic (congestive) and diastolic (congestive) heart failure: Secondary | ICD-10-CM

## 2022-11-11 LAB — CBC
HCT: 35.6 % — ABNORMAL LOW (ref 36.0–46.0)
Hemoglobin: 11.7 g/dL — ABNORMAL LOW (ref 12.0–15.0)
MCH: 32.6 pg (ref 26.0–34.0)
MCHC: 32.9 g/dL (ref 30.0–36.0)
MCV: 99.2 fL (ref 80.0–100.0)
Platelets: 226 10*3/uL (ref 150–400)
RBC: 3.59 MIL/uL — ABNORMAL LOW (ref 3.87–5.11)
RDW: 15.5 % (ref 11.5–15.5)
WBC: 5.3 10*3/uL (ref 4.0–10.5)
nRBC: 0.4 % — ABNORMAL HIGH (ref 0.0–0.2)

## 2022-11-11 LAB — CBC WITH DIFFERENTIAL/PLATELET
Abs Immature Granulocytes: 0 10*3/uL (ref 0.00–0.07)
Basophils Absolute: 0 10*3/uL (ref 0.0–0.1)
Basophils Relative: 0 %
Eosinophils Absolute: 0 10*3/uL (ref 0.0–0.5)
Eosinophils Relative: 0 %
HCT: 36.3 % (ref 36.0–46.0)
Hemoglobin: 11.8 g/dL — ABNORMAL LOW (ref 12.0–15.0)
Lymphocytes Relative: 20 %
Lymphs Abs: 1.2 10*3/uL (ref 0.7–4.0)
MCH: 33 pg (ref 26.0–34.0)
MCHC: 32.5 g/dL (ref 30.0–36.0)
MCV: 101.4 fL — ABNORMAL HIGH (ref 80.0–100.0)
Monocytes Absolute: 0.1 10*3/uL (ref 0.1–1.0)
Monocytes Relative: 1 %
Neutro Abs: 4.7 10*3/uL (ref 1.7–7.7)
Neutrophils Relative %: 79 %
Platelets: 226 10*3/uL (ref 150–400)
RBC: 3.58 MIL/uL — ABNORMAL LOW (ref 3.87–5.11)
RDW: 15.7 % — ABNORMAL HIGH (ref 11.5–15.5)
WBC: 5.9 10*3/uL (ref 4.0–10.5)
nRBC: 0 % (ref 0.0–0.2)
nRBC: 0 /100 WBC

## 2022-11-11 LAB — CREATININE, SERUM
Creatinine, Ser: 0.68 mg/dL (ref 0.44–1.00)
GFR, Estimated: >60 mL/min (ref >60)

## 2022-11-11 LAB — RESP PANEL BY RT-PCR (RSV, FLU A&B, COVID)  RVPGX2
Influenza A by PCR: NEGATIVE
Influenza B by PCR: NEGATIVE
Resp Syncytial Virus by PCR: NEGATIVE
SARS Coronavirus 2 by RT PCR: NEGATIVE

## 2022-11-11 LAB — COMPREHENSIVE METABOLIC PANEL
ALT: 14 U/L (ref 0–44)
AST: 23 U/L (ref 15–41)
Albumin: 3.2 g/dL — ABNORMAL LOW (ref 3.5–5.0)
Alkaline Phosphatase: 36 U/L — ABNORMAL LOW (ref 38–126)
Anion gap: 14 (ref 5–15)
BUN: 16 mg/dL (ref 6–20)
CO2: 23 mmol/L (ref 22–32)
Calcium: 8.7 mg/dL — ABNORMAL LOW (ref 8.9–10.3)
Chloride: 108 mmol/L (ref 98–111)
Creatinine, Ser: 0.69 mg/dL (ref 0.44–1.00)
GFR, Estimated: >60 mL/min (ref >60)
Glucose, Bld: 96 mg/dL (ref 70–99)
Potassium: 3.4 mmol/L — ABNORMAL LOW (ref 3.5–5.1)
Sodium: 145 mmol/L (ref 135–145)
Total Bilirubin: 0.5 mg/dL (ref 0.3–1.2)
Total Protein: 6.5 g/dL (ref 6.5–8.1)

## 2022-11-11 LAB — TSH: TSH: 1.978 u[IU]/mL (ref 0.350–4.500)

## 2022-11-11 LAB — BRAIN NATRIURETIC PEPTIDE: B Natriuretic Peptide: 58.5 pg/mL (ref 0.0–100.0)

## 2022-11-11 LAB — HIV ANTIBODY (ROUTINE TESTING W REFLEX): HIV Screen 4th Generation wRfx: NONREACTIVE

## 2022-11-11 MED ORDER — AZITHROMYCIN 250 MG PO TABS: 500.0000 mg | ORAL_TABLET | Freq: Every day | ORAL | Status: DC

## 2022-11-11 MED ORDER — BENZONATATE 100 MG PO CAPS
200.0000 mg | ORAL_CAPSULE | Freq: Once | ORAL | Status: AC
  Administered 2022-11-11: 200 mg via ORAL
  Filled 2022-11-11: qty 2

## 2022-11-11 MED ORDER — SODIUM CHLORIDE 0.9 % IV SOLN
1.0000 g | Freq: Once | INTRAVENOUS | Status: AC
  Administered 2022-11-11: 1 g via INTRAVENOUS
  Filled 2022-11-11: qty 10

## 2022-11-11 MED ORDER — LEVOTHYROXINE SODIUM 112 MCG PO TABS
112.0000 ug | ORAL_TABLET | Freq: Every day | ORAL | Status: DC
  Administered 2022-11-12 – 2022-11-14 (×3): 112 ug via ORAL
  Filled 2022-11-11 (×4): qty 1

## 2022-11-11 MED ORDER — SODIUM CHLORIDE 0.9 % IV SOLN
500.0000 mg | Freq: Once | INTRAVENOUS | Status: DC
  Filled 2022-11-11: qty 5

## 2022-11-11 MED ORDER — AZITHROMYCIN 250 MG PO TABS
250.0000 mg | ORAL_TABLET | Freq: Every day | ORAL | Status: DC
  Administered 2022-11-12 – 2022-11-14 (×3): 250 mg via ORAL
  Filled 2022-11-11 (×3): qty 1

## 2022-11-11 MED ORDER — ENOXAPARIN SODIUM 40 MG/0.4ML IJ SOSY: 40.0000 mg | PREFILLED_SYRINGE | INTRAMUSCULAR | Status: DC

## 2022-11-11 MED ORDER — METHYLPREDNISOLONE SODIUM SUCC 125 MG IJ SOLR
125.0000 mg | Freq: Once | INTRAMUSCULAR | Status: AC
  Administered 2022-11-11: 125 mg via INTRAVENOUS
  Filled 2022-11-11: qty 2

## 2022-11-11 MED ORDER — IPRATROPIUM-ALBUTEROL 0.5-2.5 (3) MG/3ML IN SOLN
3.0000 mL | Freq: Once | RESPIRATORY_TRACT | Status: AC
  Administered 2022-11-11: 3 mL via RESPIRATORY_TRACT
  Filled 2022-11-11: qty 3

## 2022-11-11 MED ORDER — IOHEXOL 350 MG/ML SOLN
75.0000 mL | Freq: Once | INTRAVENOUS | Status: AC | PRN
  Administered 2022-11-11: 75 mL via INTRAVENOUS

## 2022-11-11 MED ORDER — PANTOPRAZOLE SODIUM 40 MG PO TBEC
40.0000 mg | DELAYED_RELEASE_TABLET | Freq: Every day | ORAL | Status: DC
  Administered 2022-11-11 – 2022-11-14 (×4): 40 mg via ORAL
  Filled 2022-11-11 (×4): qty 1

## 2022-11-11 MED ORDER — AZITHROMYCIN 250 MG PO TABS: 250.0000 mg | ORAL_TABLET | Freq: Every day | ORAL | Status: DC

## 2022-11-11 MED ORDER — ALBUTEROL SULFATE (2.5 MG/3ML) 0.083% IN NEBU
10.0000 mg/h | INHALATION_SOLUTION | Freq: Once | RESPIRATORY_TRACT | Status: AC
  Administered 2022-11-11: 10 mg/h via RESPIRATORY_TRACT
  Filled 2022-11-11: qty 3

## 2022-11-11 MED ORDER — PANTOPRAZOLE SODIUM 40 MG IV SOLR
40.0000 mg | Freq: Once | INTRAVENOUS | Status: AC
  Administered 2022-11-11: 40 mg via INTRAVENOUS
  Filled 2022-11-11: qty 10

## 2022-11-11 MED ORDER — SUCRALFATE 1 G PO TABS
1.0000 g | ORAL_TABLET | Freq: Once | ORAL | Status: AC
  Administered 2022-11-11: 1 g via ORAL
  Filled 2022-11-11: qty 1

## 2022-11-11 MED ORDER — SODIUM CHLORIDE 0.9 % IV SOLN
500.0000 mg | Freq: Once | INTRAVENOUS | Status: AC
  Administered 2022-11-11: 500 mg via INTRAVENOUS
  Filled 2022-11-11: qty 5

## 2022-11-11 MED ORDER — IPRATROPIUM-ALBUTEROL 0.5-2.5 (3) MG/3ML IN SOLN
3.0000 mL | Freq: Four times a day (QID) | RESPIRATORY_TRACT | Status: DC
  Administered 2022-11-11 – 2022-11-12 (×2): 3 mL via RESPIRATORY_TRACT
  Filled 2022-11-11 (×2): qty 3

## 2022-11-11 MED ORDER — SODIUM CHLORIDE 0.9 % IV SOLN
2.0000 g | INTRAVENOUS | Status: AC
  Administered 2022-11-12 – 2022-11-16 (×5): 2 g via INTRAVENOUS
  Filled 2022-11-11 (×5): qty 20

## 2022-11-11 MED ORDER — SODIUM CHLORIDE 3 % IN NEBU
4.0000 mL | INHALATION_SOLUTION | Freq: Two times a day (BID) | RESPIRATORY_TRACT | Status: DC
  Administered 2022-11-12 – 2022-11-13 (×3): 4 mL via RESPIRATORY_TRACT
  Filled 2022-11-11 (×4): qty 4

## 2022-11-11 MED ORDER — ACETAMINOPHEN 325 MG PO TABS: 650.0000 mg | ORAL_TABLET | Freq: Four times a day (QID) | ORAL | Status: DC | PRN

## 2022-11-11 MED ORDER — IPRATROPIUM BROMIDE 0.02 % IN SOLN
0.5000 mg | Freq: Once | RESPIRATORY_TRACT | Status: AC
  Administered 2022-11-11: 0.5 mg via RESPIRATORY_TRACT
  Filled 2022-11-11: qty 2.5

## 2022-11-11 MED ORDER — ENOXAPARIN SODIUM 30 MG/0.3ML IJ SOSY
30.0000 mg | PREFILLED_SYRINGE | INTRAMUSCULAR | Status: DC
  Administered 2022-11-11 – 2022-11-13 (×3): 30 mg via SUBCUTANEOUS
  Filled 2022-11-11 (×4): qty 0.3

## 2022-11-11 MED ORDER — MELATONIN 5 MG PO TABS: 5.0000 mg | ORAL_TABLET | Freq: Every evening | ORAL | Status: DC | PRN

## 2022-11-11 MED ORDER — GUAIFENESIN 100 MG/5ML PO LIQD: 5.0000 mL | ORAL | Status: DC | PRN

## 2022-11-11 MED ORDER — SODIUM CHLORIDE 0.9 % IV SOLN
1.0000 g | Freq: Once | INTRAVENOUS | Status: AC
  Administered 2022-11-12: 1 g via INTRAVENOUS
  Filled 2022-11-11: qty 10

## 2022-11-11 MED ORDER — SUCRALFATE 1 G PO TABS
1.0000 g | ORAL_TABLET | Freq: Two times a day (BID) | ORAL | Status: DC
  Administered 2022-11-11 – 2022-11-14 (×6): 1 g via ORAL
  Filled 2022-11-11 (×7): qty 1

## 2022-11-11 MED ORDER — GUAIFENESIN ER 600 MG PO TB12
600.0000 mg | ORAL_TABLET | Freq: Two times a day (BID) | ORAL | Status: AC
  Administered 2022-11-11 – 2022-11-14 (×6): 600 mg via ORAL
  Filled 2022-11-11 (×6): qty 1

## 2022-11-11 MED ORDER — ONDANSETRON HCL 4 MG/2ML IJ SOLN: 4.0000 mg | Freq: Four times a day (QID) | INTRAMUSCULAR | Status: DC | PRN

## 2022-11-11 NOTE — H&P (Incomplete Revision)
History and Physical  Cynthia Underwood [id number] DOB: 17-Jun-1971 DOA: 11/11/2022  Referring physician: Dr. Kathrynn Humble, [place]  PCP: Charlott Rakes, MD  Outpatient Specialists: None. Patient coming from: Home  Chief Complaint: Cough, diarrhea.   HPI: Cynthia Underwood is a 52 y.o. female with medical history significant for hypothyroidism, tobacco user since the age of 20, no prior diagnosis of COPD or asthma, who presents to [facility] ED from home with complaints of a progressive productive cough, diarrhea for the past 4 days.  Associated with epigastric pain and 1 episode of vomiting 4 days ago.  Denies any subjective fevers.  States she has been taking Motrin frequently for the past few days due to dental pain.  She has a appointment with GI for her first colonoscopy on Thursday, 11/16/2022.  In the ED, found to be hypoxic with O2 saturation of 88% on room air with concern for early left lower lobe pneumonia seen on CT scan.  CT angio was negative for pulmonary embolism.  Also findings suggestive of mild lower lobe bronchial wall thickening and bronchiectasis.  Small amount of fluid in the esophagus, seen on CT scan, concerning for possible esophagitis with concurrent complaints of epigastric pain.  The patient was started on empiric IV antibiotics Rocephin and azithromycin.  She also received a dose of Carafate 1 g x 1 and IV Protonix 40 mg x 1.  TRH, hospitalist service, was asked to admit.    ED Course: Tmax 98.4.  BP 120/77, pulse 78, respiration rate 11, saturation 95% on 2 L.  Lab studies remarkable for serum potassium 3.4, albumin 3.2.  Hemoglobin 11.8, MCV 101.  Review of Systems: Review of systems as noted in the HPI. All other systems reviewed and are negative.   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   Sickle cell trait ([place])    Watery eyes    Left   Weakness of both legs    Past Surgical History:  Procedure Laterality Date   BREAST BIOPSY Right    EYE SURGERY Left 2017   "related to blockage in my nose"   EYE SURGERY Right 05/2019   INSERT / REPLACE / REMOVE PACEMAKER  02/15/2017   PACEMAKER IMPLANT N/A 02/15/2017   Procedure: Pacemaker Implant;  Surgeon: Deboraha Sprang, MD;  Location: [place] [facility];  Service: Cardiovascular;  Laterality: N/A;   THYROIDECTOMY N/A 04/21/2021   Procedure: TOTAL THYROIDECTOMY;  Surgeon: Ralene Ok, MD;  Location: [place];  Service: General;  Laterality: N/A;    Social History:  reports that she has been smoking cigarettes. She has never used smokeless tobacco. She reports that she does not currently use alcohol. She reports that she does not currently use drugs after having used the following drugs: Marijuana.   Allergies  Allergen Reactions   Penicillin G Other (See Comments), Rash and Nausea And Vomiting    Sore throat   Penicillins Nausea And Vomiting, Rash and Other (See Comments)    Fever Has patient had a PCN reaction causing immediate rash, facial/tongue/throat swelling, SOB or lightheadedness with hypotension: Yes Has patient had a PCN reaction causing severe rash involving mucus membranes or skin necrosis: Unknown Has patient had a PCN reaction  that required hospitalization: No Has patient had a PCN reaction occurring within the last 10 years: No If all of the above answers are "NO", then may proceed with Cephalosporin use.     Family History  Problem Relation Age of Onset   Cancer Father 55       Deceased   Heart disease Father    COPD Mother    Diabetes Maternal Uncle    Heart disease Maternal Uncle    Heart disease Paternal Grandmother    Diabetes Paternal Grandmother    Hypertension Paternal Grandmother    Neuromuscular disorder Maternal Aunt        Myotonic  dystrophy   Neuromuscular disorder Cousin        Myotonic dystrophy   Neuromuscular disorder Sister        Myotonic dystrophy   Colon cancer Neg Hx    Colon polyps Neg Hx    Esophageal cancer Neg Hx    Rectal cancer Neg Hx    Stomach cancer Neg Hx       Prior to Admission medications   Medication Sig Start Date End Date Taking? Authorizing Provider  acetaminophen (TYLENOL) 500 MG tablet Take 1,000 mg by mouth every 6 (six) hours as needed for moderate pain.   Yes [provider]  albuterol (VENTOLIN HFA) 108 (90 Base) MCG/ACT inhaler INHALE 2 PUFFS INTO THE LUNGS EVERY 4 HOURS AS NEEDED FOR WHEEZING OR SHORTNESS OF BREATH Patient taking differently: Inhale 2 puffs into the lungs every 4 (four) hours as needed for shortness of breath or wheezing. 11/18/19  Yes Newlin, Enobong, MD  hydrocortisone (ANUSOL-HC) 2.5 % rectal cream PLACE 1 APPLICATION RECTALLY 2 (TWO) TIMES DAILY. Patient taking differently: Apply 1 Application topically 2 (two) times daily as needed for hemorrhoids or anal itching. 11/03/22  Yes Charlott Rakes, MD  ibuprofen (ADVIL) 200 MG tablet Take 600 mg by mouth every 6 (six) hours as needed for moderate pain.   Yes [provider]  levothyroxine (SYNTHROID) 112 MCG tablet Take 1 tablet (112 mcg total) by mouth daily before breakfast. 10/06/22  Yes Charlott Rakes, MD  Misc. Devices MISC TubTransfer bench. Diagnosis - myotonic dystrophy 10/23/22   Charlott Rakes, MD    Physical Exam: BP 130/77   Pulse 78   Temp 98.3 F (36.8 C) (Oral)   Resp 11   Ht '5\' 3"'$  (1.6 m)   Wt 44.5 kg   LMP 12/03/2016 (Approximate)   SpO2 95%   BMI 17.36 kg/m   General: 52 y.o. year-old female well developed well nourished in no acute distress.  Alert and oriented x3. Cardiovascular: Regular rate and rhythm with no rubs or gallops.  No thyromegaly or JVD noted.  No lower extremity edema. 2/4 pulses in all 4 extremities. Respiratory: Mild rales at bases with no wheezing  noted. Good inspiratory effort. Abdomen: Soft epigastric tenderness with mild palpation.  Nondistended with normal bowel sounds x4 quadrants. Muskuloskeletal: No cyanosis, clubbing or edema noted bilaterally Neuro: CN II-XII intact, strength, sensation, reflexes Skin: No ulcerative lesions noted or rashes Psychiatry: Judgement and insight appear normal. Mood is appropriate for condition and setting          Labs on Admission:  Basic Metabolic Panel: Recent Labs  Lab 11/11/22 1238  NA 145  K 3.4*  CL 108  CO2 23  GLUCOSE 96  BUN 16  CREATININE 0.69  CALCIUM 8.7*   Liver Function Tests: Recent Labs  Lab 11/11/22 1238  AST 23  ALT 14  ALKPHOS 36*  BILITOT 0.5  PROT 6.5  ALBUMIN 3.2*   No results for input(s): "LIPASE", "AMYLASE" in the last 168 hours. No results for input(s): "AMMONIA" in the last 168 hours. CBC: Recent Labs  Lab 11/11/22 1238  WBC 5.9  NEUTROABS 4.7  HGB 11.8*  HCT 36.3  MCV 101.4*  PLT 226   Cardiac Enzymes: No results for input(s): "CKTOTAL", "CKMB", "CKMBINDEX", "TROPONINI" in the last 168 hours.  BNP (last 3 results) Recent Labs    11/11/22 1238  BNP 58.5    ProBNP (last 3 results) No results for input(s): "PROBNP" in the last 8760 hours.  CBG: No results for input(s): "GLUCAP" in the last 168 hours.  Radiological Exams on Admission: CT Angio Chest PE W and/or Wo Contrast  Result Date: 11/11/2022 CLINICAL DATA:  Cough, weakness, vomiting for 4 days.  Chest pain. EXAM: CT ANGIOGRAPHY CHEST WITH CONTRAST TECHNIQUE: Multidetector CT imaging of the chest was performed using the standard protocol during bolus administration of intravenous contrast. Multiplanar CT image reconstructions and MIPs were obtained to evaluate the vascular anatomy. RADIATION DOSE REDUCTION: This exam was performed according to the departmental dose-optimization program which includes automated exposure control, adjustment of the mA and/or kV according to patient  size and/or use of iterative reconstruction technique. CONTRAST:  53m OMNIPAQUE IOHEXOL 350 MG/ML SOLN COMPARISON:  CT chest 12/28/2016 FINDINGS: Cardiovascular: Satisfactory opacification of the pulmonary arteries to the segmental level. No evidence of pulmonary embolism. Normal heart size. Small pericardial effusion. Left chest wall pacemaker. Mediastinum/Nodes: Streak artifact from the pacemaker obscures the upper mediastinum. Small amount of fluid in the esophagus which is otherwise unremarkable. No thoracic adenopathy. Lungs/Pleura: Bibasilar atelectasis/scarring. There are few patchy ground-glass opacities in the right middle lobe and bilateral lower lobes. No pleural effusion or pneumothorax. Mild lower lobe bronchial wall thickening and bronchiolectasis. Upper Abdomen: No acute abnormality. Musculoskeletal: No chest wall abnormality. No acute osseous findings. Review of the MIP images confirms the above findings. IMPRESSION: 1. No evidence of pulmonary embolism. 2. Few patchy ground-glass opacities in the right middle lobe and bilateral lower lobes, likely infectious/inflammatory. 3. Mild lower lobe bronchial wall thickening and bronchiolectasis. 4. Small amount of fluid in the esophagus. Correlate for esophageal reflux. 5. Small pericardial effusion. Electronically Signed   By: TPlacido SouM.D.   On: 11/11/2022 19:11   DG Chest Port 1 View  Result Date: 11/11/2022 CLINICAL DATA:  Cough. EXAM: PORTABLE CHEST 1 VIEW COMPARISON:  08/27/2018 FINDINGS: The lungs are clear without focal pneumonia, edema, pneumothorax or pleural effusion. The cardiopericardial silhouette is within normal limits for size. Left permanent pacemaker again noted. Telemetry leads overlie the chest. IMPRESSION: No active disease. Electronically Signed   By: EMisty StanleyM.D.   On: 11/11/2022 13:02    EKG: I independently viewed the EKG done and my findings are as followed:  NSR 65.  QTC 469.  Assessment/Plan Present on  Admission:  CAP (community acquired pneumonia)  Principal Problem:   CAP (community acquired pneumonia)  Left lower lobe community-acquired pneumonia, POA, seen on CT scan Started on Rocephin and IV azithromycin in the ED, continue Monitor fever curve and WBC Obtain baseline procalcitonin Ur ag strep pne and ur ag legionella PRN antitussives Bronchodilators Incentive spirometer Pulmonary toilet hypersaline nebs twice daily, Mucinex 600 mg twice daily x 3 days  Acute hypoxic respiratory failure secondary to above O2 saturation 88% on room air Not on oxygen supplementation at baseline Currently on 2 L  to maintain O2 saturation greater than 92% Wean off O2 supplementation as tolerated DuoNebs every 6 hours Incentive spirometer Early mobilization. Home O2 eval on 11/12/22  Possible bronchiectasis on CT scan Mild lower lobe bronchial wall thickening and bronchiectasis, seen on CT scan. Received 1 dose of IV Solu-Medrol 125 mg x 1. Consider pulmonary referral on discharge  Possible esophagitis Endorses epigastric pain, frequent use of NSAIDs, Motrin, recently due to dental pain. Small amount of fluid in the esophagus, seen on CT scan, concerning for possible esophagitis with concurrent complaints of epigastric pain. Consider referral to GI at discharge Received IV Protonix 40 mg x 1 and sucralfate 1 g x 1 in the ED, will continue at this time. Closely monitor H&H The patient has an appointment with GI on Thursday, 11/16/2022 for a colonoscopy.  Possible acute blood loss anemia Baseline hemoglobin appears to be 12.6 Presented with hemoglobin of 11.8, repeat H&H 11.7. Closely monitor H&H Consider GI referral  Hypothyroidism Obtain TSH Last TSH was 19.400 on 10/04/2022. Resume home llevothyroxine   DVT prophylaxis: SQ Lovenox daily   Code Status: Full code   Family Communication: None at bedside   Disposition Plan: Admitted to telemetry medical unit   Consults called:  None.  Admission status: Observation status.   Status is: Observation    Kayleen Memos MD [facility] Pager [phone]  If 7PM-7AM, please contact night-coverage [email or web address] Password [facility]  11/11/2022, 8:05 PM

## 2022-11-11 NOTE — ED Notes (Signed)
ED TO INPATIENT HANDOFF REPORT  ED Nurse Name and Phone #: Edd Arbour [phone]  S Name/Age/Gender Cynthia Underwood 52 y.o. female Room/Bed: 004C/004C  Code Status   Code Status: Full Code  Home/SNF/Other Home Patient oriented to: self, place, time, and situation Is this baseline? Yes   Triage Complete: Triage complete  Chief Complaint CAP (community acquired pneumonia) [J18.9]  Triage Note Pt to the ed from home via ems with a  CC of cough, weakness, vomiting x 4 days. Pt denies fever, chills. Pt relays  cardiac hx and has a Psychologist, forensic. Pt relays chest pain with coughing, sob. Pt required 2 l [place] with a room air spo2 of 88%    Allergies Allergies  Allergen Reactions   Penicillin G Other (See Comments), Rash and Nausea And Vomiting    Sore throat   Penicillins Nausea And Vomiting, Rash and Other (See Comments)    Fever Has patient had a PCN reaction causing immediate rash, facial/tongue/throat swelling, SOB or lightheadedness with hypotension: Yes Has patient had a PCN reaction causing severe rash involving mucus membranes or skin necrosis: Unknown Has patient had a PCN reaction that required hospitalization: No Has patient had a PCN reaction occurring within the last 10 years: No If all of the above answers are "NO", then may proceed with Cephalosporin use.     Level of Care/Admitting Diagnosis ED Disposition     ED Disposition  Admit   Condition  --   [place]: [place] [[id number]]  Level of Care: Telemetry Medical [104]  May place patient in observation at 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   Sickle cell trait ([place])    Watery eyes    Left   Weakness of both legs    Past Surgical History:  Procedure Laterality Date   BREAST BIOPSY Right    EYE SURGERY Left 2017   "related to blockage in my nose"   EYE SURGERY Right 05/2019   INSERT / REPLACE / REMOVE PACEMAKER  02/15/2017   PACEMAKER IMPLANT N/A 02/15/2017   Procedure: Pacemaker Implant;  Surgeon: Deboraha Sprang, MD;  Location: [place];  Service: Cardiovascular;  Laterality: N/A;   THYROIDECTOMY N/A 04/21/2021   Procedure: TOTAL THYROIDECTOMY;  Surgeon: Ralene Ok, MD;  Location: [place];  Service: General;  Laterality: N/A;     A IV Location/Drains/Wounds Patient Lines/Drains/Airways Status     Active Line/Drains/Airways     Name Placement date Placement time Site Days   Peripheral IV 11/11/22 20 G Anterior;Left Forearm 11/11/22  1807  Forearm  less than 1   Incision (Closed) 02/15/17 Chest Left 02/15/17  1500  -- 2095   Incision (Closed) 04/21/21 Neck Other (Comment) 04/21/21  0917  -- 569            Intake/Output Last 24 hours  No intake or output data in the 24 hours ending 11/11/22 2024  Labs/Imaging Results for orders placed or performed during the hospital encounter of 11/11/22 (from the past 48 hour(s))  Comprehensive metabolic panel     Status: Abnormal   Collection Time: 11/11/22 12:38 PM  Result Value Ref Range   Sodium 145 135 - 145 mmol/L   Potassium 3.4 (L) 3.5 - 5.1 mmol/L   Chloride 108 98 - 111 mmol/L   CO2 23 22 - 32 mmol/L   Glucose, Bld 96 70 - 99 mg/dL    Comment: Glucose reference range applies only to samples taken after fasting for at least 8 hours.   BUN 16 6 - 20 mg/dL   Creatinine, Ser 0.69 0.44 - 1.00 mg/dL   Calcium  8.7 (L) 8.9 - 10.3 mg/dL   Total Protein 6.5 6.5 - 8.1 g/dL   Albumin 3.2 (L) 3.5 - 5.0 g/dL   AST 23 15 - 41 U/L   ALT 14 0 - 44 U/L   Alkaline Phosphatase 36 (L) 38 - 126 U/L   Total Bilirubin 0.5 0.3 - 1.2 mg/dL   GFR, Estimated >60 >60 mL/min    Comment: (NOTE) Calculated using the CKD-EPI Creatinine Equation (2021)    Anion gap 14 5 - 15    Comment: Performed at [facility], [place] [address], [place], [place] [address]  CBC with Differential     Status: Abnormal   Collection Time: 11/11/22 12:38 PM  Result Value Ref Range   WBC 5.9 4.0 - 10.5 K/uL   RBC 3.58 (L) 3.87 - 5.11 MIL/uL   Hemoglobin 11.8 (L) 12.0 - 15.0 g/dL   HCT 36.3 36.0 - 46.0 %   MCV 101.4 (H) 80.0 - 100.0 fL   MCH 33.0 26.0 - 34.0 pg   MCHC 32.5 30.0 - 36.0 g/dL   RDW 15.7 (H) 11.5 - 15.5 %   Platelets 226 150 - 400 K/uL   nRBC 0.0 0.0 - 0.2 %   Neutrophils Relative % 79 %   Neutro Abs 4.7 1.7 - 7.7 K/uL   Lymphocytes Relative 20 %   Lymphs Abs 1.2 0.7 - 4.0 K/uL   Monocytes Relative 1 %   Monocytes Absolute 0.1 0.1 - 1.0 K/uL   Eosinophils Relative 0 %   Eosinophils Absolute 0.0 0.0 - 0.5 K/uL   Basophils Relative 0 %   Basophils Absolute 0.0 0.0 - 0.1 K/uL   WBC Morphology MORPHOLOGY UNREMARKABLE    RBC Morphology MORPHOLOGY UNREMARKABLE    Smear Review MORPHOLOGY UNREMARKABLE    nRBC 0 0 /100 WBC   Abs Immature Granulocytes 0.00 0.00 - 0.07 K/uL    Comment: Performed at [facility], [place] [address] [place], [place] [address]  Brain natriuretic peptide     Status: None   Collection Time: 11/11/22 12:38 PM  Result Value Ref Range   B Natriuretic Peptide 58.5 0.0 - 100.0 pg/mL    Comment: Performed at [place] [address] [place], [place] [address]  Resp panel by RT-PCR (RSV, Flu A&B, Covid) Anterior Nasal Swab     Status: None   Collection Time: 11/11/22 12:39 PM   Specimen: Anterior Nasal Swab  Result Value Ref Range   SARS Coronavirus 2 by RT PCR NEGATIVE NEGATIVE    Influenza A by PCR NEGATIVE NEGATIVE   Influenza B by PCR NEGATIVE NEGATIVE    Comment: (NOTE) The Xpert Xpress SARS-CoV-2/FLU/RSV plus assay is  intended as an aid in the diagnosis of influenza from Nasopharyngeal swab specimens and should not be used as a sole basis for treatment. Nasal washings and aspirates are unacceptable for Xpert Xpress SARS-CoV-2/FLU/RSV testing.  Fact Sheet for Patients: EntrepreneurPulse.com.au  Fact Sheet for Healthcare Providers: IncredibleEmployment.be  This test is not yet approved or cleared by the [place] FDA and has been authorized for detection and/or diagnosis of SARS-CoV-2 by FDA under an Emergency Use Authorization (EUA). This EUA will remain in effect (meaning this test can be used) for the duration of the COVID-19 declaration under Section 564(b)(1) of the Act, 21 U.S.C. section 360bbb-3(b)(1), unless the authorization is terminated or revoked.     Resp Syncytial Virus by PCR NEGATIVE NEGATIVE    Comment: (NOTE) Fact Sheet for Patients: EntrepreneurPulse.com.au  Fact Sheet for Healthcare Providers: IncredibleEmployment.be  This test is not yet approved or cleared by the [place] FDA and has been authorized for detection and/or diagnosis of SARS-CoV-2 by FDA under an Emergency Use Authorization (EUA). This EUA will remain in effect (meaning this test can be used) for the duration of the COVID-19 declaration under Section 564(b)(1) of the Act, 21 U.S.C. section 360bbb-3(b)(1), unless the authorization is terminated or revoked.  Performed at [facility], [place] [address] [place], [place] [address]    CT Angio Chest PE W and/or Wo Contrast  Result Date: 11/11/2022 CLINICAL DATA:  Cough, weakness, vomiting for 4 days.  Chest pain. EXAM: CT ANGIOGRAPHY CHEST WITH CONTRAST TECHNIQUE: Multidetector CT imaging of the chest was performed using the  standard protocol during bolus administration of intravenous contrast. Multiplanar CT image reconstructions and MIPs were obtained to evaluate the vascular anatomy. RADIATION DOSE REDUCTION: This exam was performed according to the departmental dose-optimization program which includes automated exposure control, adjustment of the mA and/or kV according to patient size and/or use of iterative reconstruction technique. CONTRAST:  47m OMNIPAQUE IOHEXOL 350 MG/ML SOLN COMPARISON:  CT chest 12/28/2016 FINDINGS: Cardiovascular: Satisfactory opacification of the pulmonary arteries to the segmental level. No evidence of pulmonary embolism. Normal heart size. Small pericardial effusion. Left chest wall pacemaker. Mediastinum/Nodes: Streak artifact from the pacemaker obscures the upper mediastinum. Small amount of fluid in the esophagus which is otherwise unremarkable. No thoracic adenopathy. Lungs/Pleura: Bibasilar atelectasis/scarring. There are few patchy ground-glass opacities in the right middle lobe and bilateral lower lobes. No pleural effusion or pneumothorax. Mild lower lobe bronchial wall thickening and bronchiolectasis. Upper Abdomen: No acute abnormality. Musculoskeletal: No chest wall abnormality. No acute osseous findings. Review of the MIP images confirms the above findings. IMPRESSION: 1. No evidence of pulmonary embolism. 2. Few patchy ground-glass opacities in the right middle lobe and bilateral lower lobes, likely infectious/inflammatory. 3. Mild lower lobe bronchial wall thickening and bronchiolectasis. 4. Small amount of fluid in the esophagus. Correlate for esophageal reflux. 5. Small pericardial effusion. Electronically Signed   By: TPlacido SouM.D.   On: 11/11/2022 19:11   DG Chest Port 1 View  Result Date: 11/11/2022 CLINICAL DATA:  Cough. EXAM: PORTABLE CHEST 1 VIEW COMPARISON:  08/27/2018 FINDINGS: The lungs are clear without focal pneumonia, edema, pneumothorax or pleural effusion. The  cardiopericardial silhouette is within normal limits for size. Left permanent pacemaker again noted. Telemetry leads overlie the chest. IMPRESSION: No active disease. Electronically Signed   By: EMisty StanleyM.D.   On: 11/11/2022 13:02    Pending Labs [employer](From admission, onward)     Start     Ordered  11/18/22 0500  Creatinine, serum  (enoxaparin (LOVENOX)    CrCl >/= 30 ml/min)  Weekly,   R     Comments: while on enoxaparin therapy    11/11/22 2002   11/11/22 2003  HIV Antibody (routine testing w rflx)  (HIV Antibody (Routine testing w reflex) panel)  Once,   R        11/11/22 2002   11/11/22 2003  CBC  (enoxaparin (LOVENOX)    CrCl >/= 30 ml/min)  Once,   R       Comments: Baseline for enoxaparin therapy IF NOT ALREADY DRAWN.  Notify MD if PLT < 100 K.    11/11/22 2002   11/11/22 2003  Creatinine, serum  (enoxaparin (LOVENOX)    CrCl >/= 30 ml/min)  Once,   R       Comments: Baseline for enoxaparin therapy IF NOT ALREADY DRAWN.    11/11/22 2002            Vitals/Pain Today's Vitals   11/11/22 1730 11/11/22 1800 11/11/22 1830 11/11/22 2020  BP: 124/77 129/72 130/77   Pulse: 65 (!) 58 78   Resp: '13 15 11   '$ Temp:    97.9 F (36.6 C)  TempSrc:      SpO2: 93% 93% 95%   Weight:      Height:        Isolation Precautions Airborne and Contact precautions  Medications Medications  cefTRIAXone (ROCEPHIN) 1 g in sodium chloride 0.9 % 100 mL IVPB (has no administration in time range)  azithromycin (ZITHROMAX) 500 mg in sodium chloride 0.9 % 250 mL IVPB (has no administration in time range)  pantoprazole (PROTONIX) injection 40 mg (has no administration in time range)  sucralfate (CARAFATE) tablet 1 g (has no administration in time range)  enoxaparin (LOVENOX) injection 30 mg (has no administration in time range)  levothyroxine (SYNTHROID) tablet 112 mcg (has no administration in time range)  albuterol (PROVENTIL) (2.5 MG/3ML) 0.083% nebulizer solution (10 mg/hr  Nebulization Given 11/11/22 1404)  ipratropium (ATROVENT) nebulizer solution 0.5 mg (0.5 mg Nebulization Given 11/11/22 1404)  ipratropium-albuterol (DUONEB) 0.5-2.5 (3) MG/3ML nebulizer solution 3 mL (3 mLs Nebulization Given 11/11/22 1801)  methylPREDNISolone sodium succinate (SOLU-MEDROL) 125 mg/2 mL injection 125 mg (125 mg Intravenous Given 11/11/22 1802)  benzonatate (TESSALON) capsule 200 mg (200 mg Oral Given 11/11/22 1801)  iohexol (OMNIPAQUE) 350 MG/ML injection 75 mL (75 mLs Intravenous Contrast Given 11/11/22 1903)    Mobility walks     Focused Assessments Cardiac Assessment Handoff:    No results found for: "CKTOTAL", "CKMB", "CKMBINDEX", "TROPONINI" Lab Results  Component Value Date   DDIMER <0.27 02/04/2017   Does the Patient currently have chest pain? No   , Neuro Assessment Handoff:  Swallow screen pass? Yes          Neuro Assessment: Within Defined Limits Neuro Checks:      Has TPA been given? No If patient is a Neuro Trauma and patient is going to OR before floor call report to 4N Charge nurse: [phone] or [phone]  , Pulmonary Assessment Handoff:  Lung sounds: Bilateral Breath Sounds: Rhonchi L Breath Sounds: Rhonchi R Breath Sounds: Rhonchi O2 Device: Nasal Cannula O2 Flow Rate (L/min): 2 L/min    R Recommendations: See Admitting Provider Note  Report given to:   Additional Notes: n/a

## 2022-11-11 NOTE — ED Notes (Signed)
Pt report received from previous nurse. Pt A&O x4, vitals stable, denies needs/complaints. Call bell in reach. No acute distress noted.  

## 2022-11-11 NOTE — ED Triage Notes (Signed)
Pt to the ed from home via ems with a  CC of cough, weakness, vomiting x 4 days. Pt denies fever, chills. Pt relays  cardiac hx and has a Psychologist, forensic. Pt relays chest pain with coughing, sob. Pt required 2 l [place] with a room air spo2 of 88%

## 2022-11-11 NOTE — ED Provider Notes (Signed)
Cynthia Underwood   CSN: [id number] Arrival date & time: 11/11/22  1201     History  Chief Complaint  Patient presents with   Cough   Diarrhea    Cynthia Underwood is a 52 y.o. female.  HPI     52 y.o. 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  Patient states that she has been feeling weak for the last 4 days and having a nonproductive cough.  She has no appetite and has had reduced p.o. intake for the duration.  She is trying to hydrate herself well.  She did not have any bowel movement yesterday, but she had 2 loose bowel movements earlier today.  She is also experienced some shortness of breath and weakness, today she decided to come to the ER if she is not getting better.  Patient does not have any underlying lung disease.  Review of systems negative for fever or chills.  Home Medications Prior to Admission medications   Medication Sig Start Date End Date Taking? Authorizing Provider  acetaminophen (TYLENOL) 500 MG tablet Take 1,000 mg by mouth every 6 (six) hours as needed for moderate pain.   Yes [provider]  albuterol (VENTOLIN HFA) 108 (90 Base) MCG/ACT inhaler INHALE 2 PUFFS INTO THE LUNGS EVERY 4 HOURS AS NEEDED FOR WHEEZING OR SHORTNESS OF BREATH Patient taking differently: Inhale 2 puffs into the lungs every 4 (four) hours as needed for shortness of breath or wheezing. 11/18/19  Yes Newlin, Enobong, MD  hydrocortisone (ANUSOL-HC) 2.5 % rectal cream PLACE 1 APPLICATION RECTALLY 2 (TWO) TIMES DAILY. Patient taking differently: Apply 1 Application topically 2 (two) times daily as needed for hemorrhoids or anal itching. 11/03/22  Yes Charlott Rakes, MD  ibuprofen (ADVIL) 200 MG tablet Take 600 mg by mouth every 6 (six) hours as  needed for moderate pain.   Yes [provider]  levothyroxine (SYNTHROID) 112 MCG tablet Take 1 tablet (112 mcg total) by mouth daily before breakfast. 10/06/22  Yes Charlott Rakes, MD  Misc. Devices MISC TubTransfer bench. Diagnosis - myotonic dystrophy 10/23/22   Charlott Rakes, MD      Allergies    Penicillin g and Penicillins    Review of Systems   Review of Systems  All other systems reviewed and are negative.   Physical Exam Updated Vital Signs BP 130/77   Pulse 78   Temp 98.3 F (36.8 C) (Oral)   Resp 11   Ht '5\' 3"'$  (1.6 m)   Wt 44.5 kg   LMP 12/03/2016 (Approximate)   SpO2 95%   BMI 17.36 kg/m  Physical Exam Vitals and nursing Underwood reviewed.  Constitutional:      Appearance: She is well-developed.  HENT:     Head: Atraumatic.  Cardiovascular:     Rate and Rhythm: Normal rate.  Pulmonary:     Effort: Pulmonary effort is normal.     Breath sounds: Rhonchi present. No wheezing.     Comments: Rhonchi clears upon coughing Musculoskeletal:     Cervical back: Normal range of motion and neck supple.  Skin:    General: Skin is warm and dry.  Neurological:     Mental Status: She is alert and oriented to person, place, and time.     ED Results / Procedures / Treatments   Labs (all  labs ordered are listed, but only abnormal results are displayed) Labs Reviewed  COMPREHENSIVE METABOLIC PANEL - Abnormal; Notable for the following components:      Result Value   Potassium 3.4 (*)    Calcium 8.7 (*)    Albumin 3.2 (*)    Alkaline Phosphatase 36 (*)    All other components within normal limits  CBC WITH DIFFERENTIAL/PLATELET - Abnormal; Notable for the following components:   RBC 3.58 (*)    Hemoglobin 11.8 (*)    MCV 101.4 (*)    RDW 15.7 (*)    All other components within normal limits  RESP PANEL BY RT-PCR (RSV, FLU A&B, COVID)  RVPGX2  BRAIN NATRIURETIC PEPTIDE    EKG EKG Interpretation  Date/Time:  Saturday November 11 2022 12:15:01  EST Ventricular Rate:  65 PR Interval:  151 QRS Duration: 191 QT Interval:  451 QTC Calculation: 469 R Axis:   -48 Text Interpretation: ATRIAL SENSING and ventricular paced No significant change since last tracing Abnormal ECG Confirmed by Varney Biles [phone]) on 11/11/2022 12:40:21 PM  Radiology CT Angio Chest PE W and/or Wo Contrast  Result Date: 11/11/2022 CLINICAL DATA:  Cough, weakness, vomiting for 4 days.  Chest pain. EXAM: CT ANGIOGRAPHY CHEST WITH CONTRAST TECHNIQUE: Multidetector CT imaging of the chest was performed using the standard protocol during bolus administration of intravenous contrast. Multiplanar CT image reconstructions and MIPs were obtained to evaluate the vascular anatomy. RADIATION DOSE REDUCTION: This exam was performed according to the departmental dose-optimization program which includes automated exposure control, adjustment of the mA and/or kV according to patient size and/or use of iterative reconstruction technique. CONTRAST:  33m OMNIPAQUE IOHEXOL 350 MG/ML SOLN COMPARISON:  CT chest 12/28/2016 FINDINGS: Cardiovascular: Satisfactory opacification of the pulmonary arteries to the segmental level. No evidence of pulmonary embolism. Normal heart size. Small pericardial effusion. Left chest wall pacemaker. Mediastinum/Nodes: Streak artifact from the pacemaker obscures the upper mediastinum. Small amount of fluid in the esophagus which is otherwise unremarkable. No thoracic adenopathy. Lungs/Pleura: Bibasilar atelectasis/scarring. There are few patchy ground-glass opacities in the right middle lobe and bilateral lower lobes. No pleural effusion or pneumothorax. Mild lower lobe bronchial wall thickening and bronchiolectasis. Upper Abdomen: No acute abnormality. Musculoskeletal: No chest wall abnormality. No acute osseous findings. Review of the MIP images confirms the above findings. IMPRESSION: 1. No evidence of pulmonary embolism. 2. Few patchy ground-glass opacities in  the right middle lobe and bilateral lower lobes, likely infectious/inflammatory. 3. Mild lower lobe bronchial wall thickening and bronchiolectasis. 4. Small amount of fluid in the esophagus. Correlate for esophageal reflux. 5. Small pericardial effusion. Electronically Signed   By: TPlacido SouM.D.   On: 11/11/2022 19:11   DG Chest Port 1 View  Result Date: 11/11/2022 CLINICAL DATA:  Cough. EXAM: PORTABLE CHEST 1 VIEW COMPARISON:  08/27/2018 FINDINGS: The lungs are clear without focal pneumonia, edema, pneumothorax or pleural effusion. The cardiopericardial silhouette is within normal limits for size. Left permanent pacemaker again noted. Telemetry leads overlie the chest. IMPRESSION: No active disease. Electronically Signed   By: EMisty StanleyM.D.   On: 11/11/2022 13:02    Procedures .Critical Care  Performed by: NVarney Biles MD Authorized by: NVarney Biles MD   Critical care provider statement:    Critical care time (minutes):  42   Critical care was necessary to treat or prevent imminent or life-threatening deterioration of the following conditions:  Respiratory failure   Critical care was time spent personally by me on  the following activities:  Development of treatment plan with patient or surrogate, discussions with consultants, evaluation of patient's response to treatment, examination of patient, ordering and review of laboratory studies, ordering and review of radiographic studies, ordering and performing treatments and interventions, pulse oximetry, re-evaluation of patient's condition, review of old charts and obtaining history from patient or surrogate     Medications Ordered in ED Medications  cefTRIAXone (ROCEPHIN) 1 g in sodium chloride 0.9 % 100 mL IVPB (has no administration in time range)  azithromycin (ZITHROMAX) 500 mg in sodium chloride 0.9 % 250 mL IVPB (has no administration in time range)  albuterol (PROVENTIL) (2.5 MG/3ML) 0.083% nebulizer solution (10 mg/hr  Nebulization Given 11/11/22 1404)  ipratropium (ATROVENT) nebulizer solution 0.5 mg (0.5 mg Nebulization Given 11/11/22 1404)  ipratropium-albuterol (DUONEB) 0.5-2.5 (3) MG/3ML nebulizer solution 3 mL (3 mLs Nebulization Given 11/11/22 1801)  methylPREDNISolone sodium succinate (SOLU-MEDROL) 125 mg/2 mL injection 125 mg (125 mg Intravenous Given 11/11/22 1802)  benzonatate (TESSALON) capsule 200 mg (200 mg Oral Given 11/11/22 1801)  iohexol (OMNIPAQUE) 350 MG/ML injection 75 mL (75 mLs Intravenous Contrast Given 11/11/22 1903)    ED Course/ Medical Decision Making/ A&P Clinical Course as of 11/11/22 1956  Sat Nov 11, 2022  1353 DG Chest [place] 1 View X-ray independently interpreted.  There is no evidence of pleural effusion, consolidation. [AN]  1353 CBC with Differential(!) CBC, [id number] test and metabolic profile are all normal and reassuring. Patient still coughing, not feeling well.  We will give her nebulizer treatment and then ambulate.  If she is still hypoxic then we will need admission. [AN]  1633 I have turned off patient's oxygen.  She is saturating about 95% on room air after the breathing treatment.  BNP is normal.  At this time she will be getting ambulatory pulse ox, if reassuring then we will discharge her as bronchitis. [AN]  1751 Upon ambulation and patient's O2 sats remained at 90%.  During my reassessment thereafter, patient is satting 88% on room air. We will proceed with more breathing treatment, Solu-Medrol and admit her. There is no fever, leukocytosis therefore we will not start antibiotics now.  Will discuss findings with the admitting team to see if they want additional workup.  [AN]  1753 Patient has no known underlying lung disease history.  Best to get a CT angio chest prior to admitting. [AN]  1955 CT scan question of all 4 pneumonia.  We will proceed with admission.  Patient also noted to have esophagitis, that explains the epigastric pain she is having. [AN]     Clinical Course User Index [AN] Varney Biles, MD                             Medical Decision Making Amount and/or Complexity of Data Reviewed Labs: ordered. Decision-making details documented in ED Course. Radiology: ordered. Decision-making details documented in ED Course.  Risk Prescription drug management. Decision regarding hospitalization.   This patient presents to the ED with chief complaint(s) of cough, emesis, diarrhea, weakness and anorexia with pertinent past medical history of myotonic dystrophy, pacemaker placement.The complaint involves an extensive differential diagnosis and also carries with it a high risk of complications and morbidity.    Patient found to be hypoxic at arrival at 88%.  She was placed on 2 L of oxygen.  The differential diagnosis includes : Viral syndrome, influenza or COVID, pleural effusion, cardiac cough/pulmonary edema, pneumonia, PE.  The  initial plan is to give patient a DuoNeb treatment along with getting basic labs, chest x-ray.  No fevers here.  Patient has low O2 sats on room air.  I placed on 1 L and saturations are between 90 to 95%.  Patient has Wells score of 0, she is PERC negative therefore I do not think she has PE.  Additional history obtained: Records reviewed Primary Care Documents, including the last progress Underwood from PCP and patient's medications.  Independent labs interpretation:  The following labs were independently interpreted: Seen in the workup tab  Independent visualization and interpretation of imaging: - I independently visualized the following imaging with scope of interpretation limited to determining acute life threatening conditions related to emergency care: X-ray of the chest, which revealed no evidence of pneumonia, pleural effusion, pulmonary edema  Treatment and Reassessment: We will order hour-long breathing treatment and then reassess at 3 PM.   Final Clinical Impression(s) / ED Diagnoses Final  diagnoses:  Acute hypoxemic respiratory failure [facility])    Rx / DC Orders ED Discharge Orders     None         Varney Biles, MD 11/11/22 1956

## 2022-11-11 NOTE — H&P (Signed)
History and Physical  KASSI SWEETLAND [id number] DOB: [birth year] DOA: 11/11/2022  Referring physician: Dr. Kathrynn Humble, [place]  PCP: Charlott Rakes, MD  Outpatient Specialists: None. Patient coming from: Home  Chief Complaint: Cough, diarrhea.   HPI: JILLIEN LOUWAGIE is a 52 y.o. female with medical history significant for hypothyroidism, tobacco user since the age of 1, no prior diagnosis of COPD or asthma, who presents to [facility] ED from home with complaints of a progressive productive cough, diarrhea for the past 4 days.  Associated with epigastric pain and 1 episode of vomiting 4 days ago.  Denies any subjective fevers.  States she has been taking Motrin frequently for the past few days due to dental pain.  She has a appointment with GI for her first colonoscopy on Thursday, 11/16/2022.  In the ED, found to be hypoxic with O2 saturation of 88% on room air with concern for early left lower lobe pneumonia seen on CT scan.  CT angio was negative for pulmonary embolism.  Also findings suggestive of mild lower lobe bronchial wall thickening and bronchiectasis.  Small amount of fluid in the esophagus, seen on CT scan, concerning for possible esophagitis with concurrent complaints of epigastric pain.  The patient was started on empiric IV antibiotics Rocephin and azithromycin.  She also received a dose of Carafate 1 g x 1 and IV Protonix 40 mg x 1.  TRH, hospitalist service, was asked to admit.    ED Course: Tmax 98.4.  BP 120/77, pulse 78, respiration rate 11, saturation 95% on 2 L.  Lab studies remarkable for serum potassium 3.4, albumin 3.2.  Hemoglobin 11.8, MCV 101.  Review of Systems: Review of systems as noted in the HPI. All other systems reviewed and are negative.   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   Sickle cell trait ([place])    Watery eyes    Left   Weakness of both legs    Past Surgical History:  Procedure Laterality Date   BREAST BIOPSY Right    EYE SURGERY Left 2017   "related to blockage in my nose"   EYE SURGERY Right 05/2019   INSERT / REPLACE / REMOVE PACEMAKER  02/15/2017   PACEMAKER IMPLANT N/A 02/15/2017   Procedure: Pacemaker Implant;  Surgeon: Deboraha Sprang, MD;  Location: [place] [facility];  Service: Cardiovascular;  Laterality: N/A;   THYROIDECTOMY N/A 04/21/2021   Procedure: TOTAL THYROIDECTOMY;  Surgeon: Ralene Ok, MD;  Location: [place];  Service: General;  Laterality: N/A;    Social History:  reports that she has been smoking cigarettes. She has never used smokeless tobacco. She reports that she does not currently use alcohol. She reports that she does not currently use drugs after having used the following drugs: Marijuana.   Allergies  Allergen Reactions   Penicillin G Other (See Comments), Rash and Nausea And Vomiting    Sore throat   Penicillins Nausea And Vomiting, Rash and Other (See Comments)    Fever Has patient had a PCN reaction causing immediate rash, facial/tongue/throat swelling, SOB or lightheadedness with hypotension: Yes Has patient had a PCN reaction causing severe rash involving mucus membranes or skin necrosis: Unknown Has patient had a PCN reaction  that required hospitalization: No Has patient had a PCN reaction occurring within the last 10 years: No If all of the above answers are "NO", then may proceed with Cephalosporin use.     Family History  Problem Relation Age of Onset   Cancer Father 33       Deceased   Heart disease Father    COPD Mother    Diabetes Maternal Uncle    Heart disease Maternal Uncle    Heart disease Paternal Grandmother    Diabetes Paternal Grandmother    Hypertension Paternal Grandmother    Neuromuscular disorder Maternal Aunt        Myotonic  dystrophy   Neuromuscular disorder Cousin        Myotonic dystrophy   Neuromuscular disorder Sister        Myotonic dystrophy   Colon cancer Neg Hx    Colon polyps Neg Hx    Esophageal cancer Neg Hx    Rectal cancer Neg Hx    Stomach cancer Neg Hx       Prior to Admission medications   Medication Sig Start Date End Date Taking? Authorizing Provider  acetaminophen (TYLENOL) 500 MG tablet Take 1,000 mg by mouth every 6 (six) hours as needed for moderate pain.   Yes [provider]  albuterol (VENTOLIN HFA) 108 (90 Base) MCG/ACT inhaler INHALE 2 PUFFS INTO THE LUNGS EVERY 4 HOURS AS NEEDED FOR WHEEZING OR SHORTNESS OF BREATH Patient taking differently: Inhale 2 puffs into the lungs every 4 (four) hours as needed for shortness of breath or wheezing. 11/18/19  Yes Newlin, Enobong, MD  hydrocortisone (ANUSOL-HC) 2.5 % rectal cream PLACE 1 APPLICATION RECTALLY 2 (TWO) TIMES DAILY. Patient taking differently: Apply 1 Application topically 2 (two) times daily as needed for hemorrhoids or anal itching. 11/03/22  Yes Charlott Rakes, MD  ibuprofen (ADVIL) 200 MG tablet Take 600 mg by mouth every 6 (six) hours as needed for moderate pain.   Yes [provider]  levothyroxine (SYNTHROID) 112 MCG tablet Take 1 tablet (112 mcg total) by mouth daily before breakfast. 10/06/22  Yes Charlott Rakes, MD  Misc. Devices MISC TubTransfer bench. Diagnosis - myotonic dystrophy 10/23/22   Charlott Rakes, MD    Physical Exam: BP 130/77   Pulse 78   Temp 98.3 F (36.8 C) (Oral)   Resp 11   Ht '5\' 3"'$  (1.6 m)   Wt 44.5 kg   LMP 12/03/2016 (Approximate)   SpO2 95%   BMI 17.36 kg/m   General: 52 y.o. year-old female well developed well nourished in no acute distress.  Alert and oriented x3. Cardiovascular: Regular rate and rhythm with no rubs or gallops.  No thyromegaly or JVD noted.  No lower extremity edema. 2/4 pulses in all 4 extremities. Respiratory: Mild rales at bases with no wheezing  noted. Good inspiratory effort. Abdomen: Soft epigastric tenderness with mild palpation.  Nondistended with normal bowel sounds x4 quadrants. Muskuloskeletal: No cyanosis, clubbing or edema noted bilaterally Neuro: CN II-XII intact, strength, sensation, reflexes Skin: No ulcerative lesions noted or rashes Psychiatry: Judgement and insight appear normal. Mood is appropriate for condition and setting          Labs on Admission:  Basic Metabolic Panel: Recent Labs  Lab 11/11/22 1238  NA 145  K 3.4*  CL 108  CO2 23  GLUCOSE 96  BUN 16  CREATININE 0.69  CALCIUM 8.7*   Liver Function Tests: Recent Labs  Lab 11/11/22 1238  AST 23  ALT 14  ALKPHOS 36*  BILITOT 0.5  PROT 6.5  ALBUMIN 3.2*   No results for input(s): "LIPASE", "AMYLASE" in the last 168 hours. No results for input(s): "AMMONIA" in the last 168 hours. CBC: Recent Labs  Lab 11/11/22 1238  WBC 5.9  NEUTROABS 4.7  HGB 11.8*  HCT 36.3  MCV 101.4*  PLT 226   Cardiac Enzymes: No results for input(s): "CKTOTAL", "CKMB", "CKMBINDEX", "TROPONINI" in the last 168 hours.  BNP (last 3 results) Recent Labs    11/11/22 1238  BNP 58.5    ProBNP (last 3 results) No results for input(s): "PROBNP" in the last 8760 hours.  CBG: No results for input(s): "GLUCAP" in the last 168 hours.  Radiological Exams on Admission: CT Angio Chest PE W and/or Wo Contrast  Result Date: 11/11/2022 CLINICAL DATA:  Cough, weakness, vomiting for 4 days.  Chest pain. EXAM: CT ANGIOGRAPHY CHEST WITH CONTRAST TECHNIQUE: Multidetector CT imaging of the chest was performed using the standard protocol during bolus administration of intravenous contrast. Multiplanar CT image reconstructions and MIPs were obtained to evaluate the vascular anatomy. RADIATION DOSE REDUCTION: This exam was performed according to the departmental dose-optimization program which includes automated exposure control, adjustment of the mA and/or kV according to patient  size and/or use of iterative reconstruction technique. CONTRAST:  39m OMNIPAQUE IOHEXOL 350 MG/ML SOLN COMPARISON:  CT chest 12/28/2016 FINDINGS: Cardiovascular: Satisfactory opacification of the pulmonary arteries to the segmental level. No evidence of pulmonary embolism. Normal heart size. Small pericardial effusion. Left chest wall pacemaker. Mediastinum/Nodes: Streak artifact from the pacemaker obscures the upper mediastinum. Small amount of fluid in the esophagus which is otherwise unremarkable. No thoracic adenopathy. Lungs/Pleura: Bibasilar atelectasis/scarring. There are few patchy ground-glass opacities in the right middle lobe and bilateral lower lobes. No pleural effusion or pneumothorax. Mild lower lobe bronchial wall thickening and bronchiolectasis. Upper Abdomen: No acute abnormality. Musculoskeletal: No chest wall abnormality. No acute osseous findings. Review of the MIP images confirms the above findings. IMPRESSION: 1. No evidence of pulmonary embolism. 2. Few patchy ground-glass opacities in the right middle lobe and bilateral lower lobes, likely infectious/inflammatory. 3. Mild lower lobe bronchial wall thickening and bronchiolectasis. 4. Small amount of fluid in the esophagus. Correlate for esophageal reflux. 5. Small pericardial effusion. Electronically Signed   By: TPlacido SouM.D.   On: 11/11/2022 19:11   DG Chest Port 1 View  Result Date: 11/11/2022 CLINICAL DATA:  Cough. EXAM: PORTABLE CHEST 1 VIEW COMPARISON:  08/27/2018 FINDINGS: The lungs are clear without focal pneumonia, edema, pneumothorax or pleural effusion. The cardiopericardial silhouette is within normal limits for size. Left permanent pacemaker again noted. Telemetry leads overlie the chest. IMPRESSION: No active disease. Electronically Signed   By: EMisty StanleyM.D.   On: 11/11/2022 13:02    EKG: I independently viewed the EKG done and my findings are as followed:  NSR 65.  QTC 469.  Assessment/Plan Present on  Admission:  CAP (community acquired pneumonia)  Principal Problem:   CAP (community acquired pneumonia)  Left lower lobe community-acquired pneumonia, POA, seen on CT scan Started on Rocephin and IV azithromycin in the ED, continue Monitor fever curve and WBC Obtain baseline procalcitonin Ur ag strep pne and ur ag legionella PRN antitussives Bronchodilators Incentive spirometer Pulmonary toilet hypersaline nebs twice daily, Mucinex 600 mg twice daily x 3 days  Acute hypoxic respiratory failure secondary to above O2 saturation 88% on room air Not on oxygen supplementation at baseline Currently on 2 L  to maintain O2 saturation greater than 92% Wean off O2 supplementation as tolerated DuoNebs every 6 hours Incentive spirometer Early mobilization. Home O2 eval on 11/12/22  Possible bronchiectasis on CT scan Mild lower lobe bronchial wall thickening and bronchiectasis, seen on CT scan. Received 1 dose of IV Solu-Medrol 125 mg x 1. Consider pulmonary referral on discharge  Possible esophagitis Endorses epigastric pain, frequent use of NSAIDs, Motrin, recently due to dental pain. Small amount of fluid in the esophagus, seen on CT scan, concerning for possible esophagitis with concurrent complaints of epigastric pain. Consider referral to GI at discharge Received IV Protonix 40 mg x 1 and sucralfate 1 g x 1 in the ED, will continue at this time. Closely monitor H&H The patient has an appointment with GI on Thursday, 11/16/2022 for a colonoscopy.  Possible acute blood loss anemia Baseline hemoglobin appears to be 12.6 Presented with hemoglobin of 11.8, repeat H&H 11.7. Closely monitor H&H Consider GI referral  Hypothyroidism Obtain TSH Last TSH was 19.400 on 10/04/2022. Resume home llevothyroxine   DVT prophylaxis: SQ Lovenox daily   Code Status: Full code   Family Communication: None at bedside   Disposition Plan: Admitted to telemetry medical unit   Consults called:  None.  Admission status: Observation status.   Status is: Observation    Kayleen Memos MD [facility] Pager [phone]  If 7PM-7AM, please contact night-coverage [email or web address] Password [facility]  11/11/2022, 8:05 PM

## 2022-11-11 NOTE — ED Notes (Signed)
This NT performed pulse oximetry while ambulating on pt, pt pulse was 90 while walking.

## 2022-11-12 DIAGNOSIS — K209 Esophagitis, unspecified without bleeding: Secondary | ICD-10-CM | POA: Diagnosis not present

## 2022-11-12 DIAGNOSIS — J9601 Acute respiratory failure with hypoxia: Secondary | ICD-10-CM | POA: Diagnosis not present

## 2022-11-12 DIAGNOSIS — J189 Pneumonia, unspecified organism: Secondary | ICD-10-CM | POA: Diagnosis not present

## 2022-11-12 LAB — RESPIRATORY PANEL BY PCR

## 2022-11-12 LAB — BASIC METABOLIC PANEL
Anion gap: 12 (ref 5–15)
BUN: 12 mg/dL (ref 6–20)
CO2: 25 mmol/L (ref 22–32)
Calcium: 8.4 mg/dL — ABNORMAL LOW (ref 8.9–10.3)
Chloride: 106 mmol/L (ref 98–111)
Creatinine, Ser: 0.63 mg/dL (ref 0.44–1.00)
GFR, Estimated: >60 mL/min (ref >60)
Glucose, Bld: 180 mg/dL — ABNORMAL HIGH (ref 70–99)
Potassium: 3.4 mmol/L — ABNORMAL LOW (ref 3.5–5.1)
Sodium: 143 mmol/L (ref 135–145)

## 2022-11-12 LAB — CBC
HCT: 30.9 % — ABNORMAL LOW (ref 36.0–46.0)
Hemoglobin: 10.2 g/dL — ABNORMAL LOW (ref 12.0–15.0)
MCH: 32 pg (ref 26.0–34.0)
MCHC: 33 g/dL (ref 30.0–36.0)
MCV: 96.9 fL (ref 80.0–100.0)
Platelets: 223 10*3/uL (ref 150–400)
RBC: 3.19 MIL/uL — ABNORMAL LOW (ref 3.87–5.11)
RDW: 15.5 % (ref 11.5–15.5)
WBC: 5.1 10*3/uL (ref 4.0–10.5)
nRBC: 0.4 % — ABNORMAL HIGH (ref 0.0–0.2)

## 2022-11-12 LAB — PHOSPHORUS: Phosphorus: 2.4 mg/dL — ABNORMAL LOW (ref 2.5–4.6)

## 2022-11-12 LAB — STREP PNEUMONIAE URINARY ANTIGEN: Strep Pneumo Urinary Antigen: NEGATIVE

## 2022-11-12 LAB — PROCALCITONIN: Procalcitonin: <0.1 ng/mL

## 2022-11-12 LAB — MAGNESIUM: Magnesium: 2.3 mg/dL (ref 1.7–2.4)

## 2022-11-12 MED ORDER — POTASSIUM CHLORIDE CRYS ER 20 MEQ PO TBCR
60.0000 meq | EXTENDED_RELEASE_TABLET | Freq: Once | ORAL | Status: AC
  Administered 2022-11-12: 60 meq via ORAL
  Filled 2022-11-12: qty 3

## 2022-11-12 MED ORDER — IPRATROPIUM-ALBUTEROL 0.5-2.5 (3) MG/3ML IN SOLN
3.0000 mL | Freq: Two times a day (BID) | RESPIRATORY_TRACT | Status: DC
  Administered 2022-11-12 – 2022-11-13 (×2): 3 mL via RESPIRATORY_TRACT
  Filled 2022-11-12 (×2): qty 3

## 2022-11-12 NOTE — Progress Notes (Signed)
  Progress Note   Patient: Cynthia Underwood OVF:[id number] DOB: [birth year] DOA: 11/11/2022     0 DOS: the patient was seen and examined on 11/12/2022   Brief hospital course: 52 y.o. female with medical history significant for hypothyroidism, tobacco user since the age of 4, no prior diagnosis of COPD or asthma, who presents to [facility] ED from home with complaints of a progressive productive cough, diarrhea for the past 4 days.  Associated with epigastric pain and 1 episode of vomiting 4 days ago.  Denies any subjective fevers.   In the ED, found to be hypoxic with O2 saturation of 88% on room air with concern for early left lower lobe pneumonia seen on CT scan.  CT angio was negative for pulmonary embolism.  Also findings suggestive of mild lower lobe bronchial wall thickening and bronchiectasis.   Assessment and Plan: Left lower lobe community-acquired pneumonia, POA, seen on CT scan, secondary to metapneumovirus Started on Rocephin and IV azithromycin in the ED, had been continued -Influenza, covid, RSV neg.  -Pt is pos for metapneumovirus -Cont with supportive care. Wean O2 as tolerated   Acute hypoxic respiratory failure secondary to above -Secondary to above O2 saturation 88% on room air Not on oxygen supplementation at baseline Currently on 2 L to maintain O2 saturation greater than 92% Wean off O2 supplementation as tolerated DuoNebs every 6 hours Incentive spirometer Early mobilization.   bronchiectasis on CT scan ruled in likely secondary to metapneumovirus Mild lower lobe bronchial wall thickening and bronchiectasis, seen on CT scan. Likely secondary to above   Possible esophagitis Endorses epigastric pain, frequent use of NSAIDs, Motrin, recently due to dental pain. Small amount of fluid in the esophagus, seen on CT scan, concerning for possible esophagitis with concurrent complaints of epigastric pain. Continue Protonix 40 mg x 1 and sucralfate 1 g The patient has an  appointment with GI on Thursday, 11/16/2022 for a colonoscopy.   acute blood loss anemia ruled out -Pt denies BRBPR or dark stools Baseline hemoglobin appears to be 12.6 Hgb remains stable   Hypothyroidism Obtain TSH TSH 1.978 Cont home llevothyroxine  Hypokalemia -replaced   Subjective: Complaining of generalize malaise, continued cough, decreased appetite, increased phlegm  Physical Exam: Vitals:   11/11/22 2030 11/11/22 2133 11/12/22 0434 11/12/22 0908  BP:  120/72 108/67   Pulse: 63 73 60   Resp: 18 17 18    Temp:  97.7 F (36.5 C) 98.6 F (37 C)   TempSrc:  Oral    SpO2: 94% 95% 92% 95%  Weight:      Height:       General exam: Awake, laying in bed, in nad Respiratory system: Increased respiratory effort, no wheezing Cardiovascular system: regular rate, s1, s2 Gastrointestinal system: Soft, nondistended, positive BS Central nervous system: CN2-12 grossly intact, strength intact Extremities: Perfused, no clubbing Skin: Normal skin turgor, no notable skin lesions seen Psychiatry: Mood normal // no visual hallucinations   Data Reviewed:  Labs reviewed: Na 143, K 3.4, Cr 0.63, WBC 5.1, Hgb 10.2  Family Communication:  P t in room, famly not at bedside  Disposition: Status is: Observation The patient remains OBS appropriate and will d/c before 2 midnights.  Planned Discharge Destination: Home    Author: Marylu Lund, MD 11/12/2022 3:32 PM  For on call review [email or web address].

## 2022-11-12 NOTE — Hospital Course (Signed)
52 y.o. female with medical history significant for hypothyroidism, tobacco user since the age of 53, no prior diagnosis of COPD or asthma, who presents to [facility] ED from home with complaints of a progressive productive cough, diarrhea for the past 4 days.  Associated with epigastric pain and 1 episode of vomiting 4 days ago.  Denies any subjective fevers.   In the ED, found to be hypoxic with O2 saturation of 88% on room air with concern for early left lower lobe pneumonia seen on CT scan.  CT angio was negative for pulmonary embolism.  Also findings suggestive of mild lower lobe bronchial wall thickening and bronchiectasis.

## 2022-11-12 NOTE — Progress Notes (Addendum)
Patient transfer from ED,alert and oriented,no c/o of pain,patient assessment done,made comfortable in room,telemetry on and verified,will continue to monitor.

## 2022-11-13 DIAGNOSIS — J9601 Acute respiratory failure with hypoxia: Secondary | ICD-10-CM | POA: Diagnosis not present

## 2022-11-13 DIAGNOSIS — J189 Pneumonia, unspecified organism: Secondary | ICD-10-CM | POA: Diagnosis not present

## 2022-11-13 LAB — COMPREHENSIVE METABOLIC PANEL
ALT: 11 U/L (ref 0–44)
AST: 18 U/L (ref 15–41)
Albumin: 2.6 g/dL — ABNORMAL LOW (ref 3.5–5.0)
Alkaline Phosphatase: 31 U/L — ABNORMAL LOW (ref 38–126)
Anion gap: 7 (ref 5–15)
BUN: 10 mg/dL (ref 6–20)
CO2: 25 mmol/L (ref 22–32)
Calcium: 8.5 mg/dL — ABNORMAL LOW (ref 8.9–10.3)
Chloride: 111 mmol/L (ref 98–111)
Creatinine, Ser: 0.74 mg/dL (ref 0.44–1.00)
GFR, Estimated: >60 mL/min (ref >60)
Glucose, Bld: 91 mg/dL (ref 70–99)
Potassium: 3.2 mmol/L — ABNORMAL LOW (ref 3.5–5.1)
Sodium: 143 mmol/L (ref 135–145)
Total Bilirubin: 0.3 mg/dL (ref 0.3–1.2)
Total Protein: 5.6 g/dL — ABNORMAL LOW (ref 6.5–8.1)

## 2022-11-13 LAB — CYTOLOGY - PAP
Comment: NEGATIVE
Diagnosis: NEGATIVE
High risk HPV: NEGATIVE

## 2022-11-13 LAB — MAGNESIUM: Magnesium: 2.2 mg/dL (ref 1.7–2.4)

## 2022-11-13 MED ORDER — POTASSIUM CHLORIDE CRYS ER 20 MEQ PO TBCR
40.0000 meq | EXTENDED_RELEASE_TABLET | ORAL | Status: AC
  Administered 2022-11-13 (×2): 40 meq via ORAL
  Filled 2022-11-13 (×2): qty 2

## 2022-11-13 MED ORDER — LACTATED RINGERS IV SOLN: INTRAVENOUS | Status: DC

## 2022-11-13 MED ORDER — IPRATROPIUM-ALBUTEROL 0.5-2.5 (3) MG/3ML IN SOLN
3.0000 mL | RESPIRATORY_TRACT | Status: DC | PRN
  Administered 2022-11-14 – 2022-12-04 (×4): 3 mL via RESPIRATORY_TRACT
  Filled 2022-11-13: qty 6
  Filled 2022-11-13 (×3): qty 3

## 2022-11-13 NOTE — Progress Notes (Signed)
  Progress Note   Patient: Cynthia Underwood ZOX:[id number] DOB: [birth year] DOA: 11/11/2022     0 DOS: the patient was seen and examined on 11/13/2022   Brief hospital course: 52 y.o. female with medical history significant for hypothyroidism, tobacco user since the age of 80, no prior diagnosis of COPD or asthma, who presents to [facility] ED from home with complaints of a progressive productive cough, diarrhea for the past 4 days.  Associated with epigastric pain and 1 episode of vomiting 4 days ago.  Denies any subjective fevers.   In the ED, found to be hypoxic with O2 saturation of 88% on room air with concern for early left lower lobe pneumonia seen on CT scan.  CT angio was negative for pulmonary embolism.  Also findings suggestive of mild lower lobe bronchial wall thickening and bronchiectasis.   Assessment and Plan: Left lower lobe community-acquired pneumonia, POA, seen on CT scan, secondary to metapneumovirus Started on Rocephin and IV azithromycin in the ED, had been continued -Influenza, covid, RSV neg.  -Pt is pos for metapneumovirus -Cont with supportive care. Weaned to RA    Acute hypoxic respiratory failure secondary to above -Secondary to above O2 saturation 88% on room air at presentation Not on oxygen supplementation at baseline DuoNebs every 6 hours Incentive spirometer Weaned to RA   bronchiectasis on CT scan ruled in likely secondary to metapneumovirus Mild lower lobe bronchial wall thickening and bronchiectasis, seen on CT scan. Likely secondary to above   Possible esophagitis Endorses epigastric pain, frequent use of NSAIDs, Motrin, recently due to dental pain. Small amount of fluid in the esophagus, seen on CT scan, concerning for possible esophagitis with concurrent complaints of epigastric pain. Continue Protonix 40 mg x 1 and sucralfate 1 g The patient has an appointment with GI on Thursday, 11/16/2022 for a colonoscopy.   acute blood loss anemia ruled out -Pt  denies BRBPR or dark stools Baseline hemoglobin appears to be 12.6 Hgb currently remains stable   Hypothyroidism TSH 1.978 Cont home llevothyroxine  Hypokalemia -remains low, will replace   Subjective: Incontinent of stool this AM, over the floor and pt  Physical Exam: Vitals:   11/12/22 2013 11/13/22 0457 11/13/22 0741 11/13/22 0851  BP:  108/63 128/81   Pulse:  63 63 84  Resp:  18 16 17   Temp:  98 F (36.7 C) 97.8 F (36.6 C)   TempSrc:  Oral Oral   SpO2: 95% (!) 89% 93% 96%  Weight:      Height:       General exam: Conversant, in no acute distress Respiratory system: normal chest rise, clear, no audible wheezing Cardiovascular system: regular rhythm, s1-s2 Gastrointestinal system: Nondistended Central nervous system: No seizures, no tremors Extremities: No cyanosis, no joint deformities Skin: No rashes, no pallor Psychiatry: Affect normal // no auditory hallucinations    Data Reviewed:  Labs reviewed: Na 143, K 3.2, Cr 0.74  Family Communication:  Pt in room, family not at bedside  Disposition: Status is: Observation The patient remains OBS appropriate and will d/c before 2 midnights.  Planned Discharge Destination: Home    Author: Marylu Lund, MD 11/13/2022 3:20 PM  For on call review [email or web address].

## 2022-11-14 ENCOUNTER — Ambulatory Visit: Payer: 59 | Admitting: Rehabilitation

## 2022-11-14 ENCOUNTER — Inpatient Hospital Stay (HOSPITAL_COMMUNITY): Payer: 59 | Admitting: Anesthesiology

## 2022-11-14 ENCOUNTER — Inpatient Hospital Stay (HOSPITAL_COMMUNITY): Payer: 59

## 2022-11-14 ENCOUNTER — Telehealth: Payer: Self-pay | Admitting: Gastroenterology

## 2022-11-14 ENCOUNTER — Observation Stay (HOSPITAL_COMMUNITY): Payer: 59

## 2022-11-14 DIAGNOSIS — J440 Chronic obstructive pulmonary disease with acute lower respiratory infection: Secondary | ICD-10-CM | POA: Diagnosis present

## 2022-11-14 DIAGNOSIS — I472 Ventricular tachycardia, unspecified: Secondary | ICD-10-CM | POA: Diagnosis not present

## 2022-11-14 DIAGNOSIS — A4189 Other specified sepsis: Secondary | ICD-10-CM | POA: Diagnosis not present

## 2022-11-14 DIAGNOSIS — I429 Cardiomyopathy, unspecified: Secondary | ICD-10-CM | POA: Diagnosis not present

## 2022-11-14 DIAGNOSIS — R0902 Hypoxemia: Secondary | ICD-10-CM | POA: Diagnosis not present

## 2022-11-14 DIAGNOSIS — R918 Other nonspecific abnormal finding of lung field: Secondary | ICD-10-CM | POA: Diagnosis not present

## 2022-11-14 DIAGNOSIS — I469 Cardiac arrest, cause unspecified: Secondary | ICD-10-CM | POA: Insufficient documentation

## 2022-11-14 DIAGNOSIS — I5021 Acute systolic (congestive) heart failure: Secondary | ICD-10-CM | POA: Diagnosis not present

## 2022-11-14 DIAGNOSIS — R6521 Severe sepsis with septic shock: Secondary | ICD-10-CM | POA: Diagnosis not present

## 2022-11-14 DIAGNOSIS — I4901 Ventricular fibrillation: Secondary | ICD-10-CM

## 2022-11-14 DIAGNOSIS — K209 Esophagitis, unspecified without bleeding: Secondary | ICD-10-CM | POA: Diagnosis not present

## 2022-11-14 DIAGNOSIS — Z452 Encounter for adjustment and management of vascular access device: Secondary | ICD-10-CM | POA: Diagnosis not present

## 2022-11-14 DIAGNOSIS — I428 Other cardiomyopathies: Secondary | ICD-10-CM | POA: Diagnosis not present

## 2022-11-14 DIAGNOSIS — I442 Atrioventricular block, complete: Secondary | ICD-10-CM | POA: Diagnosis not present

## 2022-11-14 DIAGNOSIS — I5043 Acute on chronic combined systolic (congestive) and diastolic (congestive) heart failure: Secondary | ICD-10-CM | POA: Diagnosis not present

## 2022-11-14 DIAGNOSIS — J9621 Acute and chronic respiratory failure with hypoxia: Secondary | ICD-10-CM | POA: Diagnosis not present

## 2022-11-14 DIAGNOSIS — J9811 Atelectasis: Secondary | ICD-10-CM | POA: Diagnosis not present

## 2022-11-14 DIAGNOSIS — J9690 Respiratory failure, unspecified, unspecified whether with hypoxia or hypercapnia: Secondary | ICD-10-CM | POA: Diagnosis not present

## 2022-11-14 DIAGNOSIS — J189 Pneumonia, unspecified organism: Secondary | ICD-10-CM

## 2022-11-14 DIAGNOSIS — Z930 Tracheostomy status: Secondary | ICD-10-CM | POA: Diagnosis not present

## 2022-11-14 DIAGNOSIS — Z1152 Encounter for screening for COVID-19: Secondary | ICD-10-CM | POA: Diagnosis not present

## 2022-11-14 DIAGNOSIS — G7111 Myotonic muscular dystrophy: Secondary | ICD-10-CM | POA: Diagnosis present

## 2022-11-14 DIAGNOSIS — K7200 Acute and subacute hepatic failure without coma: Secondary | ICD-10-CM | POA: Diagnosis not present

## 2022-11-14 DIAGNOSIS — E872 Acidosis, unspecified: Secondary | ICD-10-CM | POA: Diagnosis not present

## 2022-11-14 DIAGNOSIS — J123 Human metapneumovirus pneumonia: Secondary | ICD-10-CM | POA: Diagnosis not present

## 2022-11-14 DIAGNOSIS — G7281 Critical illness myopathy: Secondary | ICD-10-CM | POA: Diagnosis not present

## 2022-11-14 DIAGNOSIS — R451 Restlessness and agitation: Secondary | ICD-10-CM | POA: Diagnosis not present

## 2022-11-14 DIAGNOSIS — J129 Viral pneumonia, unspecified: Secondary | ICD-10-CM | POA: Diagnosis not present

## 2022-11-14 DIAGNOSIS — J470 Bronchiectasis with acute lower respiratory infection: Secondary | ICD-10-CM | POA: Diagnosis present

## 2022-11-14 DIAGNOSIS — I5023 Acute on chronic systolic (congestive) heart failure: Secondary | ICD-10-CM | POA: Diagnosis not present

## 2022-11-14 DIAGNOSIS — R509 Fever, unspecified: Secondary | ICD-10-CM | POA: Diagnosis not present

## 2022-11-14 DIAGNOSIS — Z0389 Encounter for observation for other suspected diseases and conditions ruled out: Secondary | ICD-10-CM | POA: Diagnosis not present

## 2022-11-14 DIAGNOSIS — J9601 Acute respiratory failure with hypoxia: Secondary | ICD-10-CM

## 2022-11-14 DIAGNOSIS — Z4682 Encounter for fitting and adjustment of non-vascular catheter: Secondary | ICD-10-CM | POA: Diagnosis not present

## 2022-11-14 DIAGNOSIS — E1065 Type 1 diabetes mellitus with hyperglycemia: Secondary | ICD-10-CM | POA: Diagnosis present

## 2022-11-14 DIAGNOSIS — R410 Disorientation, unspecified: Secondary | ICD-10-CM | POA: Diagnosis not present

## 2022-11-14 DIAGNOSIS — I462 Cardiac arrest due to underlying cardiac condition: Secondary | ICD-10-CM | POA: Diagnosis not present

## 2022-11-14 DIAGNOSIS — R0602 Shortness of breath: Secondary | ICD-10-CM | POA: Diagnosis not present

## 2022-11-14 DIAGNOSIS — E870 Hyperosmolality and hypernatremia: Secondary | ICD-10-CM | POA: Diagnosis not present

## 2022-11-14 DIAGNOSIS — Y95 Nosocomial condition: Secondary | ICD-10-CM | POA: Diagnosis present

## 2022-11-14 DIAGNOSIS — Z950 Presence of cardiac pacemaker: Secondary | ICD-10-CM | POA: Diagnosis not present

## 2022-11-14 DIAGNOSIS — R9431 Abnormal electrocardiogram [ECG] [EKG]: Secondary | ICD-10-CM | POA: Diagnosis not present

## 2022-11-14 DIAGNOSIS — D638 Anemia in other chronic diseases classified elsewhere: Secondary | ICD-10-CM | POA: Diagnosis present

## 2022-11-14 DIAGNOSIS — F32A Depression, unspecified: Secondary | ICD-10-CM | POA: Diagnosis present

## 2022-11-14 DIAGNOSIS — E039 Hypothyroidism, unspecified: Secondary | ICD-10-CM | POA: Diagnosis present

## 2022-11-14 DIAGNOSIS — E43 Unspecified severe protein-calorie malnutrition: Secondary | ICD-10-CM | POA: Diagnosis not present

## 2022-11-14 DIAGNOSIS — G928 Other toxic encephalopathy: Secondary | ICD-10-CM | POA: Diagnosis not present

## 2022-11-14 DIAGNOSIS — Z4659 Encounter for fitting and adjustment of other gastrointestinal appliance and device: Secondary | ICD-10-CM | POA: Diagnosis not present

## 2022-11-14 DIAGNOSIS — J90 Pleural effusion, not elsewhere classified: Secondary | ICD-10-CM | POA: Diagnosis not present

## 2022-11-14 DIAGNOSIS — I4721 Torsades de pointes: Secondary | ICD-10-CM | POA: Diagnosis not present

## 2022-11-14 LAB — COMPREHENSIVE METABOLIC PANEL
ALT: 12 U/L (ref 0–44)
ALT: 230 U/L — ABNORMAL HIGH (ref 0–44)
AST: 18 U/L (ref 15–41)
AST: 254 U/L — ABNORMAL HIGH (ref 15–41)
Albumin: 3.1 g/dL — ABNORMAL LOW (ref 3.5–5.0)
Albumin: 2.4 g/dL — ABNORMAL LOW (ref 3.5–5.0)
Alkaline Phosphatase: 37 U/L — ABNORMAL LOW (ref 38–126)
Alkaline Phosphatase: 61 U/L (ref 38–126)
Anion gap: 11 (ref 5–15)
Anion gap: 13 (ref 5–15)
BUN: 5 mg/dL — ABNORMAL LOW (ref 6–20)
BUN: <5 mg/dL — ABNORMAL LOW (ref 6–20)
CO2: 23 mmol/L (ref 22–32)
CO2: 15 mmol/L — ABNORMAL LOW (ref 22–32)
Calcium: 9.1 mg/dL (ref 8.9–10.3)
Calcium: 8.7 mg/dL — ABNORMAL LOW (ref 8.9–10.3)
Chloride: 105 mmol/L (ref 98–111)
Chloride: 111 mmol/L (ref 98–111)
Creatinine, Ser: 0.75 mg/dL (ref 0.44–1.00)
Creatinine, Ser: 0.86 mg/dL (ref 0.44–1.00)
GFR, Estimated: >60 mL/min (ref >60)
GFR, Estimated: >60 mL/min (ref >60)
Glucose, Bld: 77 mg/dL (ref 70–99)
Glucose, Bld: 211 mg/dL — ABNORMAL HIGH (ref 70–99)
Potassium: 4 mmol/L (ref 3.5–5.1)
Potassium: 3.2 mmol/L — ABNORMAL LOW (ref 3.5–5.1)
Sodium: 139 mmol/L (ref 135–145)
Sodium: 139 mmol/L (ref 135–145)
Total Bilirubin: 0.3 mg/dL (ref 0.3–1.2)
Total Bilirubin: 0.2 mg/dL — ABNORMAL LOW (ref 0.3–1.2)
Total Protein: 6.5 g/dL (ref 6.5–8.1)
Total Protein: 5.2 g/dL — ABNORMAL LOW (ref 6.5–8.1)

## 2022-11-14 LAB — GLUCOSE, CAPILLARY
Glucose-Capillary: 142 mg/dL — ABNORMAL HIGH (ref 70–99)
Glucose-Capillary: 89 mg/dL (ref 70–99)
Glucose-Capillary: 340 mg/dL — ABNORMAL HIGH (ref 70–99)

## 2022-11-14 LAB — POCT I-STAT 7, (LYTES, BLD GAS, ICA,H+H)
Acid-base deficit: 11 mmol/L — ABNORMAL HIGH (ref 0.0–2.0)
Bicarbonate: 16.4 mmol/L — ABNORMAL LOW (ref 20.0–28.0)
Calcium, Ion: 1.3 mmol/L (ref 1.15–1.40)
HCT: 35 % — ABNORMAL LOW (ref 36.0–46.0)
Hemoglobin: 11.9 g/dL — ABNORMAL LOW (ref 12.0–15.0)
O2 Saturation: 99 %
Patient temperature: 98.2
Potassium: 3.4 mmol/L — ABNORMAL LOW (ref 3.5–5.1)
Sodium: 141 mmol/L (ref 135–145)
TCO2: 18 mmol/L — ABNORMAL LOW (ref 22–32)
pCO2 arterial: 39.8 mmHg (ref 32–48)
pH, Arterial: 7.22 — ABNORMAL LOW (ref 7.35–7.45)
pO2, Arterial: 175 mmHg — ABNORMAL HIGH (ref 83–108)

## 2022-11-14 LAB — MRSA NEXT GEN BY PCR, NASAL: MRSA by PCR Next Gen: NOT DETECTED

## 2022-11-14 LAB — CBC
HCT: 40.4 % (ref 36.0–46.0)
HCT: 35.3 % — ABNORMAL LOW (ref 36.0–46.0)
Hemoglobin: 13 g/dL (ref 12.0–15.0)
Hemoglobin: 11.8 g/dL — ABNORMAL LOW (ref 12.0–15.0)
MCH: 31.3 pg (ref 26.0–34.0)
MCH: 32.7 pg (ref 26.0–34.0)
MCHC: 32.2 g/dL (ref 30.0–36.0)
MCHC: 33.4 g/dL (ref 30.0–36.0)
MCV: 97.3 fL (ref 80.0–100.0)
MCV: 97.8 fL (ref 80.0–100.0)
Platelets: 282 10*3/uL (ref 150–400)
Platelets: 282 10*3/uL (ref 150–400)
RBC: 4.15 MIL/uL (ref 3.87–5.11)
RBC: 3.61 MIL/uL — ABNORMAL LOW (ref 3.87–5.11)
RDW: 15.2 % (ref 11.5–15.5)
RDW: 15 % (ref 11.5–15.5)
WBC: 6.6 10*3/uL (ref 4.0–10.5)
WBC: 21 10*3/uL — ABNORMAL HIGH (ref 4.0–10.5)
nRBC: 0 % (ref 0.0–0.2)
nRBC: 0.1 % (ref 0.0–0.2)

## 2022-11-14 LAB — PROTIME-INR
INR: 1.5 — ABNORMAL HIGH (ref 0.8–1.2)
Prothrombin Time: 18.4 seconds — ABNORMAL HIGH (ref 11.4–15.2)

## 2022-11-14 LAB — MAGNESIUM: Magnesium: 1.7 mg/dL (ref 1.7–2.4)

## 2022-11-14 LAB — LEGIONELLA PNEUMOPHILA SEROGP 1 UR AG: L. pneumophila Serogp 1 Ur Ag: NEGATIVE

## 2022-11-14 LAB — PHOSPHORUS: Phosphorus: 5 mg/dL — ABNORMAL HIGH (ref 2.5–4.6)

## 2022-11-14 LAB — TROPONIN I (HIGH SENSITIVITY): Troponin I (High Sensitivity): 318 ng/L (ref <18)

## 2022-11-14 MED ORDER — FENTANYL BOLUS VIA INFUSION
50.0000 ug | INTRAVENOUS | Status: DC | PRN
  Administered 2022-11-15 – 2022-11-17 (×6): 100 ug via INTRAVENOUS

## 2022-11-14 MED ORDER — DEXMEDETOMIDINE HCL IN NACL 400 MCG/100ML IV SOLN
0.0000 ug/kg/h | INTRAVENOUS | Status: DC
  Administered 2022-11-14: 0.4 ug/kg/h via INTRAVENOUS
  Administered 2022-11-15 – 2022-11-17 (×3): 0.6 ug/kg/h via INTRAVENOUS
  Administered 2022-11-17: 1.2 ug/kg/h via INTRAVENOUS
  Administered 2022-11-18: 0.6 ug/kg/h via INTRAVENOUS
  Administered 2022-11-19 (×2): 1.4 ug/kg/h via INTRAVENOUS
  Administered 2022-11-20: 1.2 ug/kg/h via INTRAVENOUS
  Administered 2022-11-20: 1.6 ug/kg/h via INTRAVENOUS
  Administered 2022-11-20: 1.2 ug/kg/h via INTRAVENOUS
  Administered 2022-11-21: 0.8 ug/kg/h via INTRAVENOUS
  Administered 2022-11-21: 1.2 ug/kg/h via INTRAVENOUS
  Administered 2022-11-22 – 2022-11-24 (×6): 0.8 ug/kg/h via INTRAVENOUS
  Administered 2022-11-25 (×2): 0.9 ug/kg/h via INTRAVENOUS
  Administered 2022-11-26: 1 ug/kg/h via INTRAVENOUS
  Administered 2022-11-26: 1.6 ug/kg/h via INTRAVENOUS
  Administered 2022-11-26 – 2022-11-27 (×3): 1 ug/kg/h via INTRAVENOUS
  Administered 2022-11-27 – 2022-11-30 (×13): 1.8 ug/kg/h via INTRAVENOUS
  Administered 2022-11-30: 1 ug/kg/h via INTRAVENOUS
  Administered 2022-12-01: 1.8 ug/kg/h via INTRAVENOUS
  Administered 2022-12-01: 0.6 ug/kg/h via INTRAVENOUS
  Administered 2022-12-01: 1.8 ug/kg/h via INTRAVENOUS
  Administered 2022-12-02: 1 ug/kg/h via INTRAVENOUS
  Administered 2022-12-02 – 2022-12-03 (×2): 0.6 ug/kg/h via INTRAVENOUS
  Administered 2022-12-04: 0.8 ug/kg/h via INTRAVENOUS
  Administered 2022-12-04: 0.5 ug/kg/h via INTRAVENOUS
  Administered 2022-12-05: 0.6 ug/kg/h via INTRAVENOUS
  Filled 2022-11-14 (×18): qty 100
  Filled 2022-11-14: qty 200
  Filled 2022-11-14 (×20): qty 100
  Filled 2022-11-14: qty 200
  Filled 2022-11-14 (×15): qty 100

## 2022-11-14 MED ORDER — HEPARIN SODIUM (PORCINE) 5000 UNIT/ML IJ SOLN: 5000.0000 [IU] | Freq: Three times a day (TID) | INTRAMUSCULAR | Status: DC

## 2022-11-14 MED ORDER — HYALURONIDASE HUMAN 150 UNIT/ML IJ SOLN
150.0000 [IU] | Freq: Once | INTRAMUSCULAR | Status: DC
  Filled 2022-11-14: qty 1

## 2022-11-14 MED ORDER — MELATONIN 5 MG PO TABS
5.0000 mg | ORAL_TABLET | Freq: Every evening | ORAL | Status: DC | PRN
  Administered 2022-11-16 – 2022-12-01 (×6): 5 mg
  Filled 2022-11-14 (×6): qty 1

## 2022-11-14 MED ORDER — NOREPINEPHRINE 4 MG/250ML-% IV SOLN
0.0000 ug/min | INTRAVENOUS | Status: DC
  Administered 2022-11-14: 24 ug/min via INTRAVENOUS
  Administered 2022-11-15: 14 ug/min via INTRAVENOUS
  Administered 2022-11-16: 2 ug/min via INTRAVENOUS
  Filled 2022-11-14 (×4): qty 250

## 2022-11-14 MED ORDER — CHLORHEXIDINE GLUCONATE CLOTH 2 % EX PADS
6.0000 | MEDICATED_PAD | Freq: Every day | CUTANEOUS | Status: DC
  Administered 2022-11-14 – 2022-12-06 (×20): 6 via TOPICAL

## 2022-11-14 MED ORDER — AZITHROMYCIN 250 MG PO TABS
250.0000 mg | ORAL_TABLET | Freq: Every day | ORAL | Status: AC
  Administered 2022-11-15: 250 mg
  Filled 2022-11-14: qty 1

## 2022-11-14 MED ORDER — DOCUSATE SODIUM 50 MG/5ML PO LIQD: 100.0000 mg | Freq: Two times a day (BID) | ORAL | Status: DC

## 2022-11-14 MED ORDER — LEVOTHYROXINE SODIUM 112 MCG PO TABS
112.0000 ug | ORAL_TABLET | Freq: Every day | ORAL | Status: DC
  Administered 2022-11-15 – 2022-12-04 (×19): 112 ug
  Filled 2022-11-14 (×22): qty 1

## 2022-11-14 MED ORDER — FENTANYL CITRATE PF 50 MCG/ML IJ SOSY
50.0000 ug | PREFILLED_SYRINGE | INTRAMUSCULAR | Status: DC | PRN
  Filled 2022-11-14: qty 1

## 2022-11-14 MED ORDER — GUAIFENESIN 100 MG/5ML PO LIQD
5.0000 mL | ORAL | Status: DC | PRN
  Administered 2022-11-18 – 2022-12-01 (×5): 5 mL
  Filled 2022-11-14 (×5): qty 5

## 2022-11-14 MED ORDER — INSULIN ASPART 100 UNIT/ML IJ SOLN
0.0000 [IU] | INTRAMUSCULAR | Status: DC
  Administered 2022-11-14: 7 [IU] via SUBCUTANEOUS
  Administered 2022-11-15 (×2): 1 [IU] via SUBCUTANEOUS
  Administered 2022-11-15: 2 [IU] via SUBCUTANEOUS
  Administered 2022-11-15 – 2022-11-17 (×3): 1 [IU] via SUBCUTANEOUS
  Administered 2022-11-17: 2 [IU] via SUBCUTANEOUS
  Administered 2022-11-17 – 2022-11-18 (×4): 1 [IU] via SUBCUTANEOUS
  Administered 2022-11-18: 2 [IU] via SUBCUTANEOUS
  Administered 2022-11-18: 3 [IU] via SUBCUTANEOUS
  Administered 2022-11-18: 1 [IU] via SUBCUTANEOUS
  Administered 2022-11-18: 2 [IU] via SUBCUTANEOUS
  Administered 2022-11-18 – 2022-11-19 (×4): 1 [IU] via SUBCUTANEOUS
  Administered 2022-11-19: 3 [IU] via SUBCUTANEOUS
  Administered 2022-11-19 – 2022-11-20 (×4): 1 [IU] via SUBCUTANEOUS
  Administered 2022-11-20 (×2): 2 [IU] via SUBCUTANEOUS
  Administered 2022-11-21 (×5): 1 [IU] via SUBCUTANEOUS
  Administered 2022-11-22: 2 [IU] via SUBCUTANEOUS
  Administered 2022-11-22 – 2022-12-06 (×45): 1 [IU] via SUBCUTANEOUS

## 2022-11-14 MED ORDER — MIDAZOLAM HCL 2 MG/2ML IJ SOLN
1.0000 mg | INTRAMUSCULAR | Status: DC | PRN
  Administered 2022-11-15 – 2022-11-30 (×18): 2 mg via INTRAVENOUS
  Administered 2022-11-30: 1 mg via INTRAVENOUS
  Administered 2022-12-01 – 2022-12-02 (×3): 2 mg via INTRAVENOUS
  Filled 2022-11-14 (×29): qty 2

## 2022-11-14 MED ORDER — SUCRALFATE 1 G PO TABS
1.0000 g | ORAL_TABLET | Freq: Two times a day (BID) | ORAL | Status: DC
  Administered 2022-11-14 – 2022-11-28 (×28): 1 g
  Filled 2022-11-14 (×30): qty 1

## 2022-11-14 MED ORDER — METHYLPREDNISOLONE SODIUM SUCC 40 MG IJ SOLR
40.0000 mg | Freq: Every day | INTRAMUSCULAR | Status: DC
  Administered 2022-11-14 – 2022-11-15 (×2): 40 mg via INTRAVENOUS
  Filled 2022-11-14 (×2): qty 1

## 2022-11-14 MED ORDER — FENTANYL 2500MCG IN NS 250ML (10MCG/ML) PREMIX INFUSION
50.0000 ug/h | INTRAVENOUS | Status: DC
  Administered 2022-11-14: 100 ug/h via INTRAVENOUS
  Administered 2022-11-15: 75 ug/h via INTRAVENOUS
  Administered 2022-11-16 (×3): 200 ug/h via INTRAVENOUS
  Filled 2022-11-14 (×6): qty 250

## 2022-11-14 MED ORDER — FENTANYL CITRATE PF 50 MCG/ML IJ SOSY: 50.0000 ug | PREFILLED_SYRINGE | Freq: Once | INTRAMUSCULAR | Status: AC

## 2022-11-14 MED ORDER — POLYETHYLENE GLYCOL 3350 17 G PO PACK: 17.0000 g | PACK | Freq: Every day | ORAL | Status: DC

## 2022-11-14 MED ORDER — ORAL CARE MOUTH RINSE: 15.0000 mL | OROMUCOSAL | Status: DC | PRN

## 2022-11-14 MED ORDER — ACETAMINOPHEN 325 MG PO TABS
650.0000 mg | ORAL_TABLET | Freq: Four times a day (QID) | ORAL | Status: DC | PRN
  Administered 2022-11-16 – 2022-12-03 (×16): 650 mg
  Filled 2022-11-14 (×17): qty 2

## 2022-11-14 MED ORDER — POTASSIUM CHLORIDE 20 MEQ PO PACK
20.0000 meq | PACK | ORAL | Status: AC
  Administered 2022-11-14 – 2022-11-15 (×2): 20 meq
  Filled 2022-11-14 (×2): qty 1

## 2022-11-14 MED ORDER — FENTANYL CITRATE PF 50 MCG/ML IJ SOSY
50.0000 ug | PREFILLED_SYRINGE | INTRAMUSCULAR | Status: DC | PRN
  Administered 2022-11-14: 50 ug via INTRAVENOUS
  Administered 2022-11-14: 100 ug via INTRAVENOUS
  Administered 2022-11-14: 50 ug via INTRAVENOUS
  Filled 2022-11-14: qty 1
  Filled 2022-11-14: qty 2

## 2022-11-14 MED ORDER — PANTOPRAZOLE SODIUM 40 MG IV SOLR
40.0000 mg | Freq: Every day | INTRAVENOUS | Status: DC
  Administered 2022-11-14 – 2022-12-05 (×22): 40 mg via INTRAVENOUS
  Filled 2022-11-14 (×22): qty 10

## 2022-11-14 MED ORDER — MIDAZOLAM HCL 2 MG/2ML IJ SOLN
INTRAMUSCULAR | Status: AC
  Administered 2022-11-14: 2 mg via INTRAVENOUS
  Filled 2022-11-14: qty 2

## 2022-11-14 MED ORDER — POTASSIUM CHLORIDE 10 MEQ/50ML IV SOLN
10.0000 meq | INTRAVENOUS | Status: AC
  Administered 2022-11-14 – 2022-11-15 (×4): 10 meq via INTRAVENOUS
  Filled 2022-11-14 (×4): qty 50

## 2022-11-14 MED ORDER — MAGNESIUM SULFATE 2 GM/50ML IV SOLN
2.0000 g | Freq: Once | INTRAVENOUS | Status: AC
  Administered 2022-11-14: 2 g via INTRAVENOUS
  Filled 2022-11-14: qty 50

## 2022-11-14 MED ORDER — ORAL CARE MOUTH RINSE
15.0000 mL | OROMUCOSAL | Status: DC
  Administered 2022-11-14 – 2022-11-17 (×34): 15 mL via OROMUCOSAL

## 2022-11-14 NOTE — Progress Notes (Signed)
PT called me to room stating that pt oxygen sat was in the 80s. Nasal canula placed on pt and pt placed on 4L. Respiratory called. Respiratory came to room and placed pt on humidified oxygen at 5L. Patient oxygen sat is now 93%. Will continue to monitor pt.

## 2022-11-14 NOTE — Consult Note (Incomplete)
NAME:  Cynthia Underwood, MRN:  [id number], DOB:  [birth year], LOS: 0 ADMISSION DATE:  11/11/2022 CONSULTATION DATE:  11/14/2022 REFERRING MD:  Wyline Copas - TR[facility] CHIEF COMPLAINT:  Vfib arrest on floor   History of Present Illness:  52 year old woman who initially presented to [facility] 3/9 for cough and progressive dyspnea x 4 days. Also reported diarrhea and 1 episode of epigastric pain with vomiting. PMHx significant for RBBB/high-grade heart block (s/p PPM), hypothyroidism, sickle cell trait, myotonic dystrophy (dx age 70), tobacco use.  Noted to be hypoxic in ED with O2 sat 88% on RA, CTA Chest c/f LLL PNA with mild bronchiectasis, negative for PE. Also c/f esophagitis. Patient was empirically started on CAP coverage (ceftriaxone, azithromycin) and GERD treatment (PPI/sucralfate). Also found to be positive for human metapneumovirus. Admitted to [facility].  Patient was initially progressing as expected 3/11-3/12 until ~1400, sat was noted to be in 80s. Placed on 5L humidified oxygen with slight improvement in sats. Solumedrol and DuoNebs were ordered. Around 1900 patient's RN was informed by telemetry of VF and was found unresponsive and pulseless. Code Blue was called with CPR x 15 minutes and ROSC. Amiodarone, Epi and defib were administered. Patient was intubated by anesthesia peri-code. IO was placed and Levophed was initiated. Cardiology was consulted.  PCCM was consulted for ICU admission post-arrest.  Pertinent Medical History:   Past Medical History:  Diagnosis Date   Bifascicular block 10/02/2014   Cataract    Dyspnea    Excess ear wax    Multinodular goiter    Pneumonia    Presence of permanent cardiac pacemaker    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   Sickle cell trait (HCC)    Watery eyes    Left   Weakness of both legs    Significant Hospital Events: Including procedures, antibiotic start and stop dates in addition to other pertinent events    3/9 - Presented to [facility] for LLL PNA. Started on Surveyor, quantity. +human metapneumovirus. 3/12 - Hypoxic requiring 5LNC. Later VF arrest on floor, CPR x   Interim History / Subjective:  PCCM consulted for ICU transfer after code on floor; Vfib arrest  Objective:  Blood pressure 120/86, pulse 85, temperature (!) 97.5 F (36.4 C), temperature source Oral, resp. rate 18, height '5\' 3"'$  (1.6 m), weight 44.5 kg, last menstrual period 12/03/2016, SpO2 93 %.        Intake/Output Summary (Last 24 hours) at 11/14/2022 2008 Last data filed at 11/14/2022 1242 Gross per 24 hour  Intake 240 ml  Output --  Net 240 ml   Filed Weights   11/11/22 1211  Weight: 44.5 kg   Physical Examination: General: Acutely ill-appearing middle-aged woman in NAD. Intubated, sedated. HEENT: [place]/AT, anicteric sclera, PERRL, moist mucous membranes. Neuro: Sedated. Responds with purposeful movements toward noxious stimuli. Withdraws to pain in in all 4 extremities. Not following commands. Moves all 4 extremities spontaneously. +Corneal, +Cough, and +Gag  CV: Intermittently ranging between rates of 60s and 120s with PVCs/some irregularity, no m/g/r. PULM: Breathing even and unlabored on vent (PEEP 10, FiO2 100%). Lung fields coarse throughout. GI: Soft, nontender, nondistended. Normoactive bowel sounds. Extremities: No LE edema noted. Skin: Warm/dry, no rashes.  Resolved Hospital Problem List:    Assessment & Plan:  Undifferentiated shock, ?septic vs. cardiogenic post-arrest - Transferred to ICU for further care - Goal MAP > 65 - Fluid resuscitation as tolerated - Levophed titrated to goal MAP - Trend WBC, fever curve,  LA - F/u Cx data - Continue broad-spectrum antibiotics (CAP coverage)  Post-Vfib arrest Troponinemia History of RBBB and PPM implantation - Supportive care/normothermia post-arrest - Cardiac monitoring - Trend trops - Optimize lytes for K > 4, Mg > 2 - Cardiology consult - Heparin gtt  for ?ACS  Acute hypoxemic respiratory failure LLL PNA +Human metapneumovirus infection - Continue full vent support (4-8cc/kg IBW) - Wean FiO2 for O2 sat > 90% - Daily WUA/SBT once appropriate from a mental status standpoint - VAP bundle - Pulmonary hygiene - PAD protocol for sedation: Precedex, Fentanyl, and Versed for goal RASS 0 to -1 - CAP coverage as above  Acute toxic metabolic encephalopathy Multifactorial in the setting of post-arrest, metabolic derangements/acidosis, hypoxia, sedation. - CT Head held, given improvement in mental status/purposeful movement and mouthing of words - Correct metabolic derangements - PAD protocol as above - Follow neuro exam closely  Shock liver Likely in the setting of VF arrest with transaminitis post-code. - Trend LFTs - Monitor coags  Hyperglycemia No history of DM per family. - SSI - CBGs Q4H - Goal CBG 140-180  GERD with esophagitis - PPI  Hypothyroidism - Continue levothyroxine  Best Practice: (right click and "Reselect all SmartList Selections" daily)   Diet/type: NPO w/ meds via tube DVT prophylaxis: prophylactic heparin , SCDs GI prophylaxis: PPI Lines: Central line and Arterial Line Foley:  Yes, and it is still needed Code Status:  full code Last date of multidisciplinary goals of care discussion [Pending - family updated at bedside 3/12PM, continue to desire full code/full scope of care]  Labs:  CBC: Recent Labs  Lab 11/11/22 1238 11/11/22 2045 11/12/22 0352 11/14/22 0023 11/14/22 2002  WBC 5.9 5.3 5.1 6.6  --   NEUTROABS 4.7  --   --   --   --   HGB 11.8* 11.7* 10.2* 13.0 11.9*  HCT 36.3 35.6* 30.9* 40.4 35.0*  MCV 101.4* 99.2 96.9 97.3  --   PLT 226 226 223 282  --    Basic Metabolic Panel: Recent Labs  Lab 11/11/22 1238 11/11/22 2045 11/12/22 0352 11/13/22 0215 11/14/22 0422 11/14/22 2002  NA 145  --  143 143 139 141  K 3.4*  --  3.4* 3.2* 4.0 3.4*  CL 108  --  106 111 105  --   CO2 23  --   '25 25 23  '$ --   GLUCOSE 96  --  180* 91 77  --   BUN 16  --  12 10 5*  --   CREATININE 0.69 0.68 0.63 0.74 0.75  --   CALCIUM 8.7*  --  8.4* 8.5* 9.1  --   MG  --   --  2.3 2.2  --   --   PHOS  --   --  2.4*  --   --   --    GFR: Estimated Creatinine Clearance: 57.1 mL/min (by C-G formula based on SCr of 0.75 mg/dL). Recent Labs  Lab 11/11/22 1238 11/11/22 2045 11/12/22 0352 11/14/22 0023  PROCALCITON  --   --  <0.10  --   WBC 5.9 5.3 5.1 6.6   Liver Function Tests: Recent Labs  Lab 11/11/22 1238 11/13/22 0215 11/14/22 0422  AST '23 18 18  '$ ALT '14 11 12  '$ ALKPHOS 36* 31* 37*  BILITOT 0.5 0.3 0.3  PROT 6.5 5.6* 6.5  ALBUMIN 3.2* 2.6* 3.1*   No results for input(s): "LIPASE", "AMYLASE" in the last 168 hours. No results for input(s): "  AMMONIA" in the last 168 hours.  ABG:    Component Value Date/Time   PHART 7.220 (L) 11/14/2022 2002   PCO2ART 39.8 11/14/2022 2002   PO2ART 175 (H) 11/14/2022 2002   HCO3 16.4 (L) 11/14/2022 2002   TCO2 18 (L) 11/14/2022 2002   ACIDBASEDEF 11.0 (H) 11/14/2022 2002   O2SAT 99 11/14/2022 2002    Coagulation Profile: No results for input(s): "INR", "PROTIME" in the last 168 hours.  Cardiac Enzymes: No results for input(s): "CKTOTAL", "CKMB", "CKMBINDEX", "TROPONINI" in the last 168 hours.  HbA1C: Hgb A1c MFr Bld  Date/Time Value Ref Range Status  01/09/2017 08:32 AM 5.7 4.6 - 6.5 % Final    Comment:    Glycemic Control Guidelines for People with Diabetes:Non Diabetic:  <6%Goal of Therapy: <7%Additional Action Suggested:  >8%   07/09/2015 12:33 PM 5.3 4.6 - 6.5 % Final    Comment:    Glycemic Control Guidelines for People with Diabetes:Non Diabetic:  <6%Goal of Therapy: <7%Additional Action Suggested:  >8%    CBG: Recent Labs  Lab 11/14/22 1835 11/14/22 1920  GLUCAP 89 142*   Review of Systems:   Patient is encephalopathic and/or intubated. Therefore history has been obtained from chart review.   Past Medical History:  She,   has a past medical history of Bifascicular block (10/02/2014), Cataract, Dyspnea, Excess ear wax, Multinodular goiter, Pneumonia, Presence of permanent cardiac pacemaker, RBBB (right 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) (10/02/2014), Sickle cell trait ([place]), Watery eyes, and Weakness of both legs.   Surgical History:   Past Surgical History:  Procedure Laterality Date   BREAST BIOPSY Right    EYE SURGERY Left 2017   "related to blockage in my nose"   EYE SURGERY Right 05/2019   INSERT / REPLACE / REMOVE PACEMAKER  02/15/2017   PACEMAKER IMPLANT N/A 02/15/2017   Procedure: Pacemaker Implant;  Surgeon: Deboraha Sprang, MD;  Location: [place] [facility];  Service: Cardiovascular;  Laterality: N/A;   THYROIDECTOMY N/A 04/21/2021   Procedure: TOTAL THYROIDECTOMY;  Surgeon: Ralene Ok, MD;  Location: [place] [facility];  Service: General;  Laterality: N/A;    Social History:   reports that she has been smoking cigarettes. She has never used smokeless tobacco. She reports that she does not currently use alcohol. She reports that she does not currently use drugs after having used the following drugs: Marijuana.   Family History:  Her family history includes COPD in her mother; Cancer (age of onset: 3) in her father; Diabetes in her maternal uncle and paternal grandmother; Heart disease in her father, maternal uncle, and paternal grandmother; Hypertension in her paternal grandmother; Neuromuscular disorder in her cousin, maternal aunt, and sister. There is no history of Colon cancer, Colon polyps, Esophageal cancer, Rectal cancer, or Stomach cancer.   Allergies: Allergies  Allergen Reactions   Penicillin G Other (See Comments), Rash and Nausea And Vomiting    Sore throat   Penicillins Nausea And Vomiting, Rash and Other (See Comments)    Fever Has patient had a PCN reaction causing immediate rash, facial/tongue/throat swelling, SOB or lightheadedness with hypotension: Yes Has  patient had a PCN reaction causing severe rash involving mucus membranes or skin necrosis: Unknown Has patient had a PCN reaction that required hospitalization: No Has patient had a PCN reaction occurring within the last 10 years: No If all of the above answers are "NO", then may proceed with Cephalosporin use.    Home Medications: Prior to Admission medications  Medication Sig Start Date End Date Taking? Authorizing Provider  acetaminophen (TYLENOL) 500 MG tablet Take 1,000 mg by mouth every 6 (six) hours as needed for moderate pain.   Yes [provider]  albuterol (VENTOLIN HFA) 108 (90 Base) MCG/ACT inhaler INHALE 2 PUFFS INTO THE LUNGS EVERY 4 HOURS AS NEEDED FOR WHEEZING OR SHORTNESS OF BREATH Patient taking differently: Inhale 2 puffs into the lungs every 4 (four) hours as needed for shortness of breath or wheezing. 11/18/19  Yes Newlin, Enobong, MD  hydrocortisone (ANUSOL-HC) 2.5 % rectal cream PLACE 1 APPLICATION RECTALLY 2 (TWO) TIMES DAILY. Patient taking differently: Apply 1 Application topically 2 (two) times daily as needed for hemorrhoids or anal itching. 11/03/22  Yes Charlott Rakes, MD  ibuprofen (ADVIL) 200 MG tablet Take 600 mg by mouth every 6 (six) hours as needed for moderate pain.   Yes [provider]  levothyroxine (SYNTHROID) 112 MCG tablet Take 1 tablet (112 mcg total) by mouth daily before breakfast. 10/06/22  Yes Charlott Rakes, MD  Misc. Devices MISC TubTransfer bench. 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   Critical Care Time devoted to patient care services, exclusive of separately billable procedures, described in this  note is 43 minutes.  Lestine Mount, PA-C [facility] 11/14/22 8:09 PM  Please see [email or web address] for pager details.  From 7A-7P if no response, please call [phone] After hours, please call ELink [phone]

## 2022-11-14 NOTE — Procedures (Signed)
Arterial Catheter Insertion Procedure Note  Cynthia Underwood  ZB:[id number]  06/07/1971  Date:11/14/22  Time:8:02 PM    Provider Performing: Ulice Dash    Procedure: Insertion of Arterial Line [phone]) with US guidance [id number])   Indication(s) Blood pressure monitoring and/or need for frequent ABGs  Consent Risks of the procedure as well as the alternatives and risks of each were explained to the patient and/or caregiver.  Consent for the procedure was obtained and is signed in the bedside chart  Anesthesia None   Time Out Verified patient identification, verified procedure, site/side was marked, verified correct patient position, special equipment/implants available, medications/allergies/relevant history reviewed, required imaging and test results available.   Sterile Technique Maximal sterile technique including full sterile barrier drape, hand hygiene, sterile gown, sterile gloves, mask, hair covering, sterile ultrasound probe cover (if used).   Procedure Description Area of catheter insertion was cleaned with chlorhexidine and draped in sterile fashion. With real-time ultrasound guidance an arterial catheter was placed into the right radial artery.  Appropriate arterial tracings confirmed on monitor.     Complications/Tolerance None; patient tolerated the procedure well.   EBL Minimal   Specimen(s) None

## 2022-11-14 NOTE — Telephone Encounter (Signed)
Good afternoon Dr. Silverio Decamp,  Patient called stating that she needed to cancel her colonoscopy with you for  3/14 at 9:30 due to patient is currently in hopstial with pneumonia.    Patient stated she will call back at a later time to reschedule.

## 2022-11-14 NOTE — Anesthesia Procedure Notes (Addendum)
Procedure Name: Intubation Date/Time: 11/14/2022 6:43 PM  Performed by: Rande Brunt, CRNAPre-anesthesia Checklist: Patient identified, Emergency Drugs available, Suction available and Patient being monitored Patient Re-evaluated:Patient Re-evaluated prior to induction Oxygen Delivery Method: Ambu bag Preoxygenation: Pre-oxygenation with 100% oxygen Ventilation: Mask ventilation without difficulty Laryngoscope Size: Glidescope and 3 Grade View: Grade I Tube type: Oral Tube size: 7.5 mm Number of attempts: 1 Airway Equipment and Method: Oral airway and Video-laryngoscopy Placement Confirmation: ETT inserted through vocal cords under direct vision, positive ETCO2 and breath sounds checked- equal and bilateral Secured at: 24 cm Tube secured with: Tape Dental Injury: Teeth and Oropharynx as per pre-operative assessment

## 2022-11-14 NOTE — Code Documentation (Signed)
  Patient Name: Cynthia Underwood   MRN: [id number]   Date of Birth/ Sex: 03-Apr-1971 , female      Admission Date: 11/11/2022  Attending Provider: Donne Hazel, MD  Primary Diagnosis: CAP (community acquired pneumonia)   Indication: Pt was admitted for acute hypoxic respiratory failure due to CAP and metapneumovirus, convalescing well until this PM, when she was noted to be unresponsive. She was not breathing effectively and had no pulse. Her rhythm on telemetry was noted to be V-fib. Code blue was subsequently called. At the time of arrival on scene, ACLS protocol was underway.   Technical Description:  - CPR performance duration:  15 minutes  - Was defibrillation or cardioversion used? Yes   - Was external pacer placed? No  - Was patient intubated pre/post CPR? Yes   Medications Administered: Y = Yes; Blank = No Amiodarone  y  Atropine    Calcium    Epinephrine  y  Lidocaine    Magnesium    Norepinephrine    Phenylephrine    Sodium bicarbonate    Vasopressin     Post CPR evaluation:  - Final Status - Was patient successfully resuscitated ? Yes - What is current rhythm? Sinus rhythm - What is current hemodynamic status? Unstable, starting pressors  Miscellaneous Information:  - Labs sent, including: BMP, magnesium, lactate, EKG, Echo, CXR  - Primary team notified?  Yes  - Family Notified? Yes  - Additional notes/ transfer status: Transferred to 8M57 ICU     Nani Gasser, MD  11/14/2022, 7:24 PM

## 2022-11-14 NOTE — Progress Notes (Signed)
ETT Tube pulled back 3cm to 22cm at the upper lip. CCM at bedside.

## 2022-11-14 NOTE — Progress Notes (Signed)
Progress Note   Patient: Cynthia Underwood [id number] DOB: 08-Nov-1970 DOA: 11/11/2022     0 DOS: the patient was seen and examined on 11/14/2022   Brief hospital course: 52 y.o. female with medical history significant for hypothyroidism, tobacco user since the age of 29, no prior diagnosis of COPD or asthma, who presents to [facility] ED from home with complaints of a progressive productive cough, diarrhea for the past 4 days.  Associated with epigastric pain and 1 episode of vomiting 4 days ago.  Denies any subjective fevers.   In the ED, found to be hypoxic with O2 saturation of 88% on room air with concern for early left lower lobe pneumonia seen on CT scan.  CT angio was negative for pulmonary embolism.  Also findings suggestive of mild lower lobe bronchial wall thickening and bronchiectasis.   Assessment and Plan: Left lower lobe community-acquired pneumonia, POA, seen on CT scan, secondary to metapneumovirus Continued on Rocephin and IV azithromycin -Influenza, covid, RSV neg.  -Pt is pos for metapneumovirus -Pt complaining of increasing cough and malaise this AM. O2 requirements up to [facility] -Ordered and reviewed CXR. Subtle lingular or upper lobe process seen, otherwise unremarkable -Continue PRN duoneb. Have added IV solumedrol. Ordered flutter valve -Wean O2 as tolerated   Acute hypoxic respiratory failure secondary to above -Secondary to above O2 saturation 88% on room air at presentation Not on oxygen supplementation at baseline DuoNebs as needed. O2 requirements up to 5L this afternoon. Added solumedrol and flutter valve Cont to wean O2 as tolerated   bronchiectasis on CT scan ruled in likely secondary to metapneumovirus Mild lower lobe bronchial wall thickening and bronchiectasis, seen on CT scan. Likely secondary to above Flutter valve ordered   Possible esophagitis Endorses epigastric pain, frequent use of NSAIDs, Motrin, recently due to dental pain. Small amount of fluid  in the esophagus, seen on CT scan, concerning for possible esophagitis with concurrent complaints of epigastric pain. Continue Protonix and Carafate as ordered The patient had an appointment with GI on Thursday, 11/16/2022 for a colonoscopy, now to be rescheduled since pt is in hospital   acute blood loss anemia ruled out -Pt denies BRBPR or dark stools Baseline hemoglobin appears to be 12.6 Hgb currently remains stable, currently 13.0   Hypothyroidism TSH 1.978 Cont home levothyroxine  Hypokalemia -remains low, will replace   Subjective: Complains of diarrhea, increased malaise, increased cough overnight  Physical Exam: Vitals:   11/14/22 0840 11/14/22 1500 11/14/22 1559 11/14/22 1603  BP: (!) 122/90     Pulse: 84  85   Resp: 17  18   Temp: 97.8 F (36.6 C)     TempSrc: Oral     SpO2: (!) 80% (!) 67% (!) 83% 93%  Weight:      Height:       General exam: Awake, laying in bed, in appears uncomfortable Respiratory system: Active wheezing, coarse breath sounds Cardiovascular system: regular rate, s1, s2 Gastrointestinal system: Soft, nondistended, positive BS Central nervous system: CN2-12 grossly intact, strength intact Extremities: Perfused, no clubbing Skin: Normal skin turgor, no notable skin lesions seen Psychiatry: Mood normal // no visual hallucinations   Data Reviewed:  Labs reviewed: Na 139, K 4.0, Cr 0.75  Family Communication:  Pt in room, family not at bedside  Disposition: Status is: Observation The patient will require care spanning > 2 midnights and should be moved to inpatient because: Severity of illness  Planned Discharge Destination: Home    Author: Marylu Lund,  MD 11/14/2022 5:30 PM  For on call review [email or web address].

## 2022-11-14 NOTE — Procedures (Signed)
Central Venous Catheter Insertion Procedure Note  CRISSIE LEOS  [id number]  1971-04-11  Date:11/14/22  Time:9:21 PM   Provider Performing:Dawnya Grams Jerilynn Mages Ayesha Rumpf   Procedure: Insertion of Non-tunneled Central Venous [phone]) with US guidance [id number])   Indication(s): Medication administration and Difficult access  Consent: Unable to obtain consent due to emergent nature of procedure.  Anesthesia: Topical only with 1% lidocaine   Timeout: Verified patient identification, verified procedure, site/side was marked, verified correct patient position, special equipment/implants available, medications/allergies/relevant history reviewed, required imaging and test results available.  Sterile Technique: Maximal sterile technique including full sterile barrier drape, hand hygiene, sterile gown, sterile gloves, mask, hair covering, sterile ultrasound probe cover (if used).  Procedure Description: Area of catheter insertion was cleaned with chlorhexidine and draped in sterile fashion.  With real-time ultrasound guidance a central venous catheter was placed into the left internal jugular vein. Nonpulsatile blood flow and easy flushing noted in all ports.  The catheter was sutured in place and sterile dressing applied.    Complications/Tolerance None; patient tolerated the procedure well. Chest X-ray is ordered to verify placement for internal jugular or subclavian cannulation.   EBL Minimal  Specimen(s) None  Lestine Mount, [place] [facility] 11/14/22 9:21 PM  Please see [email or web address] for pager details.  From 7A-7P if no response, please call [phone] After hours, please call ELink [phone]

## 2022-11-14 NOTE — Progress Notes (Signed)
eLink Physician-Brief Progress Note Patient Name: Cynthia Underwood DOB: 1970-10-21 MRN: [id number]   Date of Service  11/14/2022  HPI/Events of Note  Patient originally admitted to the medical floor with community acquired pneumonia for which she was receiving treatment, unfortunately developed V-Fib arrest on the floor and required 15 minutes of ACLS protocol (including defibrillation) prior to ROSC, currently intubated, on the ventilator, and on pressors.  eICU Interventions  New Patient Evaluation.        Frederik Pear 11/14/2022, 7:34 PM

## 2022-11-14 NOTE — Evaluation (Signed)
Physical Therapy Evaluation Patient Details Name: Cynthia Underwood MRN: [id number] DOB: 07-Sep-1970 Today's Date: 11/14/2022  History of Present Illness  52 y.o. admitted 3/9 with Left lower lobe community-acquired pneumonia, Acute hypoxic respiratory failure.    Past medical history significant for hypothyroidism,  Clinical Impression  Limited evaluation. Pt heading to bathroom when PT entered room. Assisted at supervision level with RW for light support. Noted to be mildly dyspneic and wheezing. Sats checked and noted to be 67%, HR 112. RN notified and provided O2 tubing and supplied supplemental O2 at 5L bringing sats into 80s, respiratory called by RN. Further assessment deferred at this time. Pt reports feeling better with supplemental O2. Pt in bed HOB elevated with staff in room. Patient will continue to benefit from skilled physical therapy services to further improve independence with functional mobility.      Recommendations for follow up therapy are one component of a multi-disciplinary discharge planning process, led by the attending physician.  Recommendations may be updated based on patient status, additional functional criteria and insurance authorization.  Follow Up Recommendations No PT follow up (Anticipate good recovery)      Assistance Recommended at Discharge Set up Supervision/Assistance  Patient can return home with the following  A little help with walking and/or transfers;A little help with bathing/dressing/bathroom;Help with stairs or ramp for entrance;Assist for transportation;Assistance with cooking/housework    Equipment Recommendations  (TBD unlikely to need)  Recommendations for Other Services       Functional Status Assessment Patient has had a recent decline in their functional status and demonstrates the ability to make significant improvements in function in a reasonable and predictable amount of time.     Precautions / Restrictions  Precautions Precautions: None Precaution Comments: monitor O2 Restrictions Weight Bearing Restrictions: No      Mobility  Bed Mobility Overal bed mobility: Modified Independent             General bed mobility comments: extra time    Transfers Overall transfer level: Needs assistance Equipment used: Rolling walker (2 wheels) Transfers: Sit to/from Stand Sit to Stand: Supervision           General transfer comment: Pt exiting bed as PT entered room, patient reports going to rest room. no physical assist needed to rise from EOB. RW utilized for support.    Ambulation/Gait Ambulation/Gait assistance: Supervision Gait Distance (Feet): 15 Feet (x2) Assistive device: Rolling walker (2 wheels) Gait Pattern/deviations: Step-through pattern, Decreased stride length Gait velocity: decr Gait velocity interpretation: <1.31 ft/sec, indicative of household ambulator   General Gait Details: Mild instability noted, no physical assist required, pt amb to bathroom and back with supervision for safety. noted to be SOB, sats checked. 67% RN notified, returned to bed quickly.  Stairs            Wheelchair Mobility    Modified Rankin (Stroke Patients Only)       Balance Overall balance assessment: Mild deficits observed, not formally tested                                           Pertinent Vitals/Pain Pain Assessment Pain Assessment: No/denies pain    Home Living Family/patient expects to be discharged to:: Private residence Living Arrangements: Other relatives Available Help at Discharge: Family;Available PRN/intermittently Type of Home: House Home Access: Level entry     Alternate Level Stairs-Number  of Steps: flight Home Layout: Bed/bath upstairs;Two level Home Equipment: None      Prior Function Prior Level of Function : Independent/Modified Independent             Mobility Comments: ind ADLs Comments: ind     Hand Dominance    Dominant Hand: Right    Extremity/Trunk Assessment   Upper Extremity Assessment Upper Extremity Assessment: Defer to OT evaluation    Lower Extremity Assessment Lower Extremity Assessment: Generalized weakness       Communication   Communication: No difficulties  Cognition Arousal/Alertness: Awake/alert Behavior During Therapy: WFL for tasks assessed/performed Overall Cognitive Status: Within Functional Limits for tasks assessed                                          General Comments General comments (skin integrity, edema, etc.): Sats 67%, RN notified, O2 applied 5L, RN called respiratory, pt symptoms improved with increased supplemental o2.    Exercises     Assessment/Plan    PT Assessment Patient needs continued PT services  PT Problem List Decreased strength;Decreased activity tolerance;Decreased balance;Decreased mobility;Decreased knowledge of use of DME;Cardiopulmonary status limiting activity       PT Treatment Interventions DME instruction;Gait training;Stair training;Functional mobility training;Therapeutic activities;Therapeutic exercise;Balance training;Neuromuscular re-education;Patient/family education    PT Goals (Current goals can be found in the Care Plan section)  Acute Rehab PT Goals Patient Stated Goal: get well PT Goal Formulation: With patient Time For Goal Achievement: 11/21/22 Potential to Achieve Goals: Good    Frequency Min 3X/week     Co-evaluation               AM-PAC PT "6 Clicks" Mobility  Outcome Measure Help needed turning from your back to your side while in a flat bed without using bedrails?: None Help needed moving from lying on your back to sitting on the side of a flat bed without using bedrails?: None Help needed moving to and from a bed to a chair (including a wheelchair)?: A Little Help needed standing up from a chair using your arms (e.g., wheelchair or bedside chair)?: A Little Help needed to  walk in hospital room?: A Little Help needed climbing 3-5 steps with a railing? : A Little 6 Click Score: 20    End of Session       Nurse Communication: Other (comment) (Sats 67%) PT Visit Diagnosis: Unsteadiness on feet (R26.81);Other abnormalities of gait and mobility (R26.89);Muscle weakness (generalized) (M62.81)    Time: [id number] PT Time Calculation (min) (ACUTE ONLY): 22 min   Charges:   PT Evaluation $PT Eval Low Complexity: [place], PT, DPT Physical Therapist Acute Rehabilitation Services [place]   Ellouise Newer 11/14/2022, 4:40 PM

## 2022-11-14 NOTE — Consult Note (Signed)
Cardiology Consultation   Patient ID: Cynthia Underwood MRN: [id number]; DOB: 04-Dec-1970  Admit date: 11/11/2022 Date of Consult: 11/14/2022  PCP:  Cynthia Underwood, [place] [facility] Cardiologist:  Cynthia Him, MD  Electrophysiologist:  Cynthia Axe, MD  {  Patient Profile:   Cynthia Underwood is a 52 y.o. female with a hx of COPD, myotonic dystrophy type I since age 28 with intermittent jaw locking, finger stiffness, and difficulty relaxing muscles, high grade heart block s/p PPM, RBBB, sickle cell trait, and DM1 who is being seen 11/14/2022 for the evaluation of post cardiac arrest at the request of Cynthia Underwood.  History of Present Illness:   Cynthia Underwood was last seen by cardiology 01/2021 by Cynthia Underwood and was noted to be doing well at that time. Last echo 01/2021 showed preserved EF, normal RV function, and no significant valvular disease. She has a family history of sarcoidosis (father had heart transplant). Cardiac MRI concerning for myocardial thinning associated with hypokinesis and subendocardial late gadolinium enhancement in the mid inferior and ineferolateral walls, could not rule out sarcoidosis. She underwent FDG-PET that was negative for evidence of sarcoidosis.   Pt admitted 11/11/22 for acute hypoxic respiratory failure.  She received ABX for LLL CAP secondary to metapneumovirus.  There was also note of possible esophagitis as she reported epigastric pain and small amount of fluid in esophagus onCT. Started on protonix and carafate. She carries a history of dysphagia.   She was doing well until this evening, 11/14/22, when she was noted to be unresponsive. Telemetry with V-fib and code blue initiated. She received defibrillation, amiodarone and epinephrine with 15 min of CPR. ROSC achieved and pt intubated.  Currently on Levophed with team working on arterial line and further central venous access.  She has an intraosseous access in her right leg.  Follow-up lab  work and ECG are pending.  Past Medical History:  Diagnosis Date   Bifascicular block 10/02/2014   Cataract    Dyspnea    Excess ear wax    Multinodular goiter    Pneumonia    Presence of permanent cardiac pacemaker    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   Sickle cell trait ([place])    Watery eyes    Left   Weakness of both legs     Past Surgical History:  Procedure Laterality Date   BREAST BIOPSY Right    EYE SURGERY Left 2017   "related to blockage in my nose"   EYE SURGERY Right 05/2019   INSERT / REPLACE / REMOVE PACEMAKER  02/15/2017   PACEMAKER IMPLANT N/A 02/15/2017   Procedure: Pacemaker Implant;  Surgeon: Cynthia Sprang, MD;  Location: [facility];  Service: Cardiovascular;  Laterality: N/A;   THYROIDECTOMY N/A 04/21/2021   Procedure: TOTAL THYROIDECTOMY;  Surgeon: Cynthia Ok, MD;  Location: [facility];  Service: General;  Laterality: N/A;     Home Medications:  Prior to Admission medications   Medication Sig Start Date End Date Taking? Authorizing Provider  acetaminophen (TYLENOL) 500 MG tablet Take 1,000 mg by mouth every 6 (six) hours as needed for moderate pain.   Yes [provider]  albuterol (VENTOLIN HFA) 108 (90 Base) MCG/ACT inhaler INHALE 2 PUFFS INTO THE LUNGS EVERY 4 HOURS AS NEEDED FOR WHEEZING OR SHORTNESS OF BREATH Patient taking differently: Inhale 2 puffs into the lungs every 4 (four) hours as needed for shortness of breath or wheezing.  11/18/19  Yes Underwood, [place], MD  hydrocortisone (ANUSOL-HC) 2.5 % rectal cream PLACE 1 APPLICATION RECTALLY 2 (TWO) TIMES DAILY. Patient taking differently: Apply 1 Application topically 2 (two) times daily as needed for hemorrhoids or anal itching. 11/03/22  Yes Cynthia Rakes, MD  ibuprofen (ADVIL) 200 MG tablet Take 600 mg by mouth every 6 (six) hours as needed for moderate pain.   Yes [provider]  levothyroxine (SYNTHROID) 112 MCG tablet Take 1  tablet (112 mcg total) by mouth daily before breakfast. 10/06/22  Yes Cynthia Rakes, MD  Misc. Devices MISC TubTransfer bench. Diagnosis - myotonic dystrophy 10/23/22   Cynthia Rakes, MD    Inpatient Medications: Scheduled Meds:  azithromycin  250 mg Oral Daily   enoxaparin (LOVENOX) injection  30 mg Subcutaneous Q24H   levothyroxine  112 mcg Oral QAC breakfast   methylPREDNISolone (SOLU-MEDROL) injection  40 mg Intravenous Daily   pantoprazole  40 mg Oral Daily   sucralfate  1 g Oral BID   Continuous Infusions:  cefTRIAXone (ROCEPHIN)  IV 2 g (11/13/22 2121)   lactated ringers 100 mL/hr at 11/14/22 1325   PRN Meds: acetaminophen, guaiFENesin, ipratropium-albuterol, melatonin, ondansetron (ZOFRAN) IV  Allergies:    Allergies  Allergen Reactions   Penicillin G Other (See Comments), Rash and Nausea And Vomiting    Sore throat   Penicillins Nausea And Vomiting, Rash and Other (See Comments)    Fever Has patient had a PCN reaction causing immediate rash, facial/tongue/throat swelling, SOB or lightheadedness with hypotension: Yes Has patient had a PCN reaction causing severe rash involving mucus membranes or skin necrosis: Unknown Has patient had a PCN reaction that required hospitalization: No Has patient had a PCN reaction occurring within the last 10 years: No If all of the above answers are "NO", then may proceed with Cephalosporin use.     Social History:   Social History   Socioeconomic History   Marital status: Single    Spouse name: Not on file   Number of children: 0   Years of education: 1.5 Collge   Highest education level: Not on file  Occupational History   Occupation: Unemployed    Employer: OTHER  Tobacco Use   Smoking status: Every Day    Years: 28.00    Types: Cigarettes    Last attempt to quit: 10/04/2016    Years since quitting: 6.1   Smokeless tobacco: Never   Tobacco comments:    3 cigarettes a day  Vaping Use   Vaping Use: Never used   Substance and Sexual Activity   Alcohol use: Not Currently    Alcohol/week: 0.0 standard drinks of alcohol    Comment:  few times a year   Drug use: Not Currently    Types: Marijuana    Comment: occ    Sexual activity: Not Currently    Birth control/protection: None, Condom  Other Topics Concern   Not on file  Social History Narrative   Patient lives at home with her aunt.   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  She moved to [facility] in August 2015.   She has been on disability since 2010.   Education: 1 1/2 years of college.   Caffeine - coke 2 cans/day   Exercise - no   Social Determinants of Health   Financial Resource Strain: Low Risk  (10/16/2022)   Overall Financial Resource Strain (CARDIA)    Difficulty of Paying Living Expenses: Not hard at all  Food  Insecurity: No Food Insecurity (10/16/2022)   Hunger Vital Sign    Worried About Running Out of Food in the Last Year: Never true    Ran Out of Food in the Last Year: Never true  Transportation Needs: No Transportation Needs (10/16/2022)   PRAPARE - Hydrologist (Medical): No    Lack of Transportation (Non-Medical): No  Physical Activity: Inactive (10/16/2022)   Exercise Vital Sign    Days of Exercise per Week: 0 days    Minutes of Exercise per Session: 0 min  Stress: Stress Concern Present (10/16/2022)   [place]    Feeling of Stress : To some extent  Social Connections: Socially Isolated (10/16/2022)   Social Connection and Isolation Panel [NHANES]    Frequency of Communication with Friends and Family: Three times a week    Frequency of Social Gatherings with Friends and Family: Three times a week    Attends Religious Services: Never    Active Member of Clubs or Organizations: No    Attends Archivist Meetings: Never    Marital Status: Never married  Intimate Partner Violence: Not At Risk  (10/16/2022)   Humiliation, Afraid, Rape, and Kick questionnaire    Fear of Current or Ex-Partner: No    Emotionally Abused: No    Physically Abused: No    Sexually Abused: No    Family History:    Family History  Problem Relation Age of Onset   Cancer Father 14       Deceased   Heart disease Father    COPD Mother    Diabetes Maternal Uncle    Heart disease Maternal Uncle    Heart disease Paternal Grandmother    Diabetes Paternal Grandmother    Hypertension Paternal Grandmother    Neuromuscular disorder Maternal Aunt        Myotonic dystrophy   Neuromuscular disorder Cousin        Myotonic dystrophy   Neuromuscular disorder Sister        Myotonic dystrophy   Colon cancer Neg Hx    Colon polyps Neg Hx    Esophageal cancer Neg Hx    Rectal cancer Neg Hx    Stomach cancer Neg Hx      ROS:  Please see the history of present illness.  All other ROS reviewed and negative.     Physical Exam/Data:   Vitals:   11/14/22 1555 11/14/22 1559 11/14/22 1603 11/14/22 1923  BP: 120/86     Pulse: 87 85    Resp: 16 18    Temp: 98 F (36.7 C)   (!) 97.5 F (36.4 C)  TempSrc: Oral   Oral  SpO2: (!) 81% (!) 83% 93%   Weight:      Height:        Intake/Output Summary (Last 24 hours) at 11/14/2022 1929 Last data filed at 11/14/2022 1242 Gross per 24 hour  Intake 240 ml  Output --  Net 240 ml      11/11/2022   12:11 PM 11/08/2022    2:22 PM 10/31/2022    3:02 PM  Last 3 Weights  Weight (lbs) 98 lb 98 lb 9.6 oz 103 lb  Weight (kg) 44.453 kg 44.725 kg 46.72 kg     Body mass index is 17.36 kg/m.  General: Patient does not follow commands but moving upper extremities, some posturing of her right arm. Neck: no JVD Vascular: No carotid bruits;  Distal pulses diminished at this time. Cardiac: RRR with distant heart sounds and no obvious gallop. Lungs: Clear to auscultation anteriorly. Abd: Soft, bowel sounds decreased. Ext: No pitting edema.   Skin: Warm and dry. Neuro: Unable  to fully assess at this time.  EKG: Tracing from 11/11/2022 shows dual-chamber pacing. Telemetry: Sinus tachycardia.  Laboratory Data:  High Sensitivity Troponin:  No results for input(s): "TROPONINIHS" in the last 720 hours.   Chemistry Recent Labs  Lab 11/12/22 0352 11/13/22 0215 11/14/22 0422  NA 143 143 139  K 3.4* 3.2* 4.0  CL 106 111 105  CO2 '25 25 23  '$ GLUCOSE 180* 91 77  BUN 12 10 5*  CREATININE 0.63 0.74 0.75  CALCIUM 8.4* 8.5* 9.1  MG 2.3 2.2  --   GFRNONAA >60 >60 >60  ANIONGAP '12 7 11    '$ Recent Labs  Lab 11/11/22 1238 11/13/22 0215 11/14/22 0422  PROT 6.5 5.6* 6.5  ALBUMIN 3.2* 2.6* 3.1*  AST '23 18 18  '$ ALT '14 11 12  '$ ALKPHOS 36* 31* 37*  BILITOT 0.5 0.3 0.3   Hematology Recent Labs  Lab 11/11/22 2045 11/12/22 0352 11/14/22 0023  WBC 5.3 5.1 6.6  RBC 3.59* 3.19* 4.15  HGB 11.7* 10.2* 13.0  HCT 35.6* 30.9* 40.4  MCV 99.2 96.9 97.3  MCH 32.6 32.0 31.3  MCHC 32.9 33.0 32.2  RDW 15.5 15.5 15.2  PLT 226 223 282   Thyroid  Recent Labs  Lab 11/11/22 2045  TSH 1.978    BNP Recent Labs  Lab 11/11/22 1238  BNP 58.5     Radiology/Studies:  DG CHEST PORT 1 VIEW  Result Date: 11/14/2022 CLINICAL DATA:  52 year old female presents for evaluation of pneumonia. EXAM: PORTABLE CHEST 1 VIEW COMPARISON:  November 11, 2022 FINDINGS: LEFT-sided dual lead pacer device remains in place. EKG leads project over the chest. Postoperative changes noted in the low neck. Cardiomediastinal contours and hilar structures are normal. No lobar consolidative process. No pneumothorax. No gross effusion. Subtle airspace process along the LEFT heart border. On limited assessment no acute skeletal process. IMPRESSION: Lingular or upper lobe process is quite subtle and could reflect atelectasis, difficult to exclude early infection. Electronically Signed   By: Zetta Bills M.D.   On: 11/14/2022 13:45   CT Angio Chest PE W and/or Wo Contrast  Result Date: 11/11/2022 CLINICAL DATA:   Cough, weakness, vomiting for 4 days.  Chest pain. EXAM: CT ANGIOGRAPHY CHEST WITH CONTRAST TECHNIQUE: Multidetector CT imaging of the chest was performed using the standard protocol during bolus administration of intravenous contrast. Multiplanar CT image reconstructions and MIPs were obtained to evaluate the vascular anatomy. RADIATION DOSE REDUCTION: This exam was performed according to the departmental dose-optimization program which includes automated exposure control, adjustment of the mA and/or kV according to patient size and/or use of iterative reconstruction technique. CONTRAST:  55m OMNIPAQUE IOHEXOL 350 MG/ML SOLN COMPARISON:  CT chest 12/28/2016 FINDINGS: Cardiovascular: Satisfactory opacification of the pulmonary arteries to the segmental level. No evidence of pulmonary embolism. Normal heart size. Small pericardial effusion. Left chest wall pacemaker. Mediastinum/Nodes: Streak artifact from the pacemaker obscures the upper mediastinum. Small amount of fluid in the esophagus which is otherwise unremarkable. No thoracic adenopathy. Lungs/Pleura: Bibasilar atelectasis/scarring. There are few patchy ground-glass opacities in the right middle lobe and bilateral lower lobes. No pleural effusion or pneumothorax. Mild lower lobe bronchial wall thickening and bronchiolectasis. Upper Abdomen: No acute abnormality. Musculoskeletal: No chest wall abnormality. No acute osseous findings. Review  of the MIP images confirms the above findings. IMPRESSION: 1. No evidence of pulmonary embolism. 2. Few patchy ground-glass opacities in the right middle lobe and bilateral lower lobes, likely infectious/inflammatory. 3. Mild lower lobe bronchial wall thickening and bronchiolectasis. 4. Small amount of fluid in the esophagus. Correlate for esophageal reflux. 5. Small pericardial effusion. Electronically Signed   By: Placido Sou M.D.   On: 11/11/2022 19:11   DG Chest Port 1 View  Result Date: 11/11/2022 CLINICAL  DATA:  Cough. EXAM: PORTABLE CHEST 1 VIEW COMPARISON:  08/27/2018 FINDINGS: The lungs are clear without focal pneumonia, edema, pneumothorax or pleural effusion. The cardiopericardial silhouette is within normal limits for size. Left permanent pacemaker again noted. Telemetry leads overlie the chest. IMPRESSION: No active disease. Electronically Signed   By: Misty Stanley M.D.   On: 11/11/2022 13:02     Assessment and Plan:   1.  Status post VF arrest as discussed above with successful ROSC.  Patient currently intubated and on Levophed.  2.  Currently admitted with acute hypoxic respiratory failure in the setting of left lower lobe community-acquired pneumonia secondary to metapneumovirus.  Bronchiectasis noted on CT as well.  She has been on antibiotic therapy per primary team, on 5 L nasal cannula as of this morning.  3.  Myotonic dystrophy type I with history of high degree heart block status post Medtronic dual-chamber pacemaker followed by Dr. Caryl Comes.  4.  Family history of sarcoidosis.  She underwent prior workup with cardiac MRI and subsequent FDG-PET, ultimately with negative workup for sarcoidosis at [facility].  Continue supportive measures.  Would repeat blood work including chemistries and CBC, recheck ECG, order follow-up echocardiogram.  CCM managing at this time.  We will continue to follow and make further recommendations as more information becomes available.  For questions or updates, please contact [facility] Please consult [email or web address] for contact info under    Signed, Satira Sark, M.D., F.A.C.C.

## 2022-11-14 NOTE — Telephone Encounter (Signed)
ok 

## 2022-11-14 NOTE — Progress Notes (Signed)
   11/14/22 1845  Spiritual Encounters  Type of Visit Initial;Attempt (pt unavailable)  Care provided to: Pt not available  Conversation partners present during encounter Physician  Referral source Code page  Reason for visit Code  OnCall Visit Yes   Chaplain responded to a code blue the patient was attended to by the medical team. No family is present. If a chaplain is requested someone will respond.   Danice Goltz  [facility]  [phone]

## 2022-11-14 NOTE — Progress Notes (Signed)
Central monitoring called at 6:27 stating that the patients HR was in the 200s. Walked in room and patient eyes were rolled back but she still had a pulse. Pulled Code button. Patient had agonal breathing. Lost pulse at 6:30 began CPR. Did several rounds of CPR before code team arrived and took over.

## 2022-11-15 ENCOUNTER — Inpatient Hospital Stay (HOSPITAL_COMMUNITY): Payer: 59

## 2022-11-15 DIAGNOSIS — I469 Cardiac arrest, cause unspecified: Secondary | ICD-10-CM

## 2022-11-15 DIAGNOSIS — E43 Unspecified severe protein-calorie malnutrition: Secondary | ICD-10-CM | POA: Insufficient documentation

## 2022-11-15 DIAGNOSIS — I4901 Ventricular fibrillation: Secondary | ICD-10-CM | POA: Diagnosis not present

## 2022-11-15 DIAGNOSIS — J9601 Acute respiratory failure with hypoxia: Secondary | ICD-10-CM | POA: Diagnosis not present

## 2022-11-15 DIAGNOSIS — I5021 Acute systolic (congestive) heart failure: Secondary | ICD-10-CM

## 2022-11-15 DIAGNOSIS — I5043 Acute on chronic combined systolic (congestive) and diastolic (congestive) heart failure: Secondary | ICD-10-CM

## 2022-11-15 DIAGNOSIS — I472 Ventricular tachycardia, unspecified: Secondary | ICD-10-CM

## 2022-11-15 LAB — COMPREHENSIVE METABOLIC PANEL
ALT: 226 U/L — ABNORMAL HIGH (ref 0–44)
ALT: 187 U/L — ABNORMAL HIGH (ref 0–44)
AST: 445 U/L — ABNORMAL HIGH (ref 15–41)
AST: 234 U/L — ABNORMAL HIGH (ref 15–41)
Albumin: 2.7 g/dL — ABNORMAL LOW (ref 3.5–5.0)
Albumin: 2.8 g/dL — ABNORMAL LOW (ref 3.5–5.0)
Alkaline Phosphatase: 48 U/L (ref 38–126)
Alkaline Phosphatase: 50 U/L (ref 38–126)
Anion gap: 12 (ref 5–15)
Anion gap: 13 (ref 5–15)
BUN: 7 mg/dL (ref 6–20)
BUN: 7 mg/dL (ref 6–20)
CO2: 20 mmol/L — ABNORMAL LOW (ref 22–32)
CO2: 21 mmol/L — ABNORMAL LOW (ref 22–32)
Calcium: 9 mg/dL (ref 8.9–10.3)
Calcium: 8.9 mg/dL (ref 8.9–10.3)
Chloride: 107 mmol/L (ref 98–111)
Chloride: 107 mmol/L (ref 98–111)
Creatinine, Ser: 0.81 mg/dL (ref 0.44–1.00)
Creatinine, Ser: 0.81 mg/dL (ref 0.44–1.00)
GFR, Estimated: >60 mL/min (ref >60)
GFR, Estimated: >60 mL/min (ref >60)
Glucose, Bld: 164 mg/dL — ABNORMAL HIGH (ref 70–99)
Glucose, Bld: 143 mg/dL — ABNORMAL HIGH (ref 70–99)
Potassium: 4.6 mmol/L (ref 3.5–5.1)
Potassium: 4 mmol/L (ref 3.5–5.1)
Sodium: 139 mmol/L (ref 135–145)
Sodium: 141 mmol/L (ref 135–145)
Total Bilirubin: 0.4 mg/dL (ref 0.3–1.2)
Total Bilirubin: 0.4 mg/dL (ref 0.3–1.2)
Total Protein: 6 g/dL — ABNORMAL LOW (ref 6.5–8.1)
Total Protein: 6 g/dL — ABNORMAL LOW (ref 6.5–8.1)

## 2022-11-15 LAB — HEMOGLOBIN A1C
Hgb A1c MFr Bld: 5.5 % (ref 4.8–5.6)
Hgb A1c MFr Bld: 5.6 % (ref 4.8–5.6)
Mean Plasma Glucose: 111 mg/dL
Mean Plasma Glucose: 114 mg/dL

## 2022-11-15 LAB — MAGNESIUM
Magnesium: 2.7 mg/dL — ABNORMAL HIGH (ref 1.7–2.4)
Magnesium: 2.5 mg/dL — ABNORMAL HIGH (ref 1.7–2.4)

## 2022-11-15 LAB — POCT I-STAT 7, (LYTES, BLD GAS, ICA,H+H)
Acid-base deficit: 6 mmol/L — ABNORMAL HIGH (ref 0.0–2.0)
Bicarbonate: 19.3 mmol/L — ABNORMAL LOW (ref 20.0–28.0)
Calcium, Ion: 1.27 mmol/L (ref 1.15–1.40)
HCT: 37 % (ref 36.0–46.0)
Hemoglobin: 12.6 g/dL (ref 12.0–15.0)
O2 Saturation: 100 %
Patient temperature: 96.5
Potassium: 4.5 mmol/L (ref 3.5–5.1)
Sodium: 137 mmol/L (ref 135–145)
TCO2: 20 mmol/L — ABNORMAL LOW (ref 22–32)
pCO2 arterial: 35.9 mmHg (ref 32–48)
pH, Arterial: 7.332 — ABNORMAL LOW (ref 7.35–7.45)
pO2, Arterial: 424 mmHg — ABNORMAL HIGH (ref 83–108)

## 2022-11-15 LAB — COOXEMETRY PANEL
Carboxyhemoglobin: 1.8 % — ABNORMAL HIGH (ref 0.5–1.5)
Methemoglobin: <0.7 % (ref 0.0–1.5)
O2 Saturation: 87.1 %
Total hemoglobin: 12.6 g/dL (ref 12.0–16.0)

## 2022-11-15 LAB — CBC
HCT: 34.7 % — ABNORMAL LOW (ref 36.0–46.0)
Hemoglobin: 12.2 g/dL (ref 12.0–15.0)
MCH: 32.8 pg (ref 26.0–34.0)
MCHC: 35.2 g/dL (ref 30.0–36.0)
MCV: 93.3 fL (ref 80.0–100.0)
Platelets: 247 10*3/uL (ref 150–400)
RBC: 3.72 MIL/uL — ABNORMAL LOW (ref 3.87–5.11)
RDW: 14.8 % (ref 11.5–15.5)
WBC: 17.3 10*3/uL — ABNORMAL HIGH (ref 4.0–10.5)
nRBC: 0 % (ref 0.0–0.2)

## 2022-11-15 LAB — ECHOCARDIOGRAM COMPLETE
Area-P 1/2: 2.79 cm2
Calc EF: 36.6 %
Est EF: 20
Height: 63 in
S' Lateral: 2.8 cm
Single Plane A2C EF: 32.3 %
Single Plane A4C EF: 34.6 %
Weight: 1568 oz

## 2022-11-15 LAB — GLUCOSE, CAPILLARY
Glucose-Capillary: 172 mg/dL — ABNORMAL HIGH (ref 70–99)
Glucose-Capillary: 135 mg/dL — ABNORMAL HIGH (ref 70–99)
Glucose-Capillary: 142 mg/dL — ABNORMAL HIGH (ref 70–99)
Glucose-Capillary: 190 mg/dL — ABNORMAL HIGH (ref 70–99)
Glucose-Capillary: 121 mg/dL — ABNORMAL HIGH (ref 70–99)
Glucose-Capillary: 116 mg/dL — ABNORMAL HIGH (ref 70–99)

## 2022-11-15 LAB — HEPARIN LEVEL (UNFRACTIONATED): Heparin Unfractionated: 0.53 IU/mL (ref 0.30–0.70)

## 2022-11-15 LAB — LACTIC ACID, PLASMA
Lactic Acid, Venous: 3 mmol/L (ref 0.5–1.9)
Lactic Acid, Venous: 1.3 mmol/L (ref 0.5–1.9)

## 2022-11-15 LAB — PHOSPHORUS
Phosphorus: 2 mg/dL — ABNORMAL LOW (ref 2.5–4.6)
Phosphorus: 4.1 mg/dL (ref 2.5–4.6)

## 2022-11-15 LAB — TROPONIN I (HIGH SENSITIVITY)
Troponin I (High Sensitivity): 1359 ng/L (ref <18)
Troponin I (High Sensitivity): 411 ng/L (ref <18)

## 2022-11-15 MED ORDER — DOCUSATE SODIUM 50 MG/5ML PO LIQD: 100.0000 mg | Freq: Two times a day (BID) | ORAL | Status: DC | PRN

## 2022-11-15 MED ORDER — HEPARIN BOLUS VIA INFUSION
2000.0000 [IU] | Freq: Once | INTRAVENOUS | Status: AC
  Administered 2022-11-15: 2000 [IU] via INTRAVENOUS
  Filled 2022-11-15: qty 2000

## 2022-11-15 MED ORDER — ASPIRIN 325 MG PO TABS
325.0000 mg | ORAL_TABLET | Freq: Once | ORAL | Status: AC
  Administered 2022-11-15: 325 mg
  Filled 2022-11-15: qty 1

## 2022-11-15 MED ORDER — POLYETHYLENE GLYCOL 3350 17 G PO PACK: 17.0000 g | PACK | Freq: Every day | ORAL | Status: DC | PRN

## 2022-11-15 MED ORDER — SODIUM PHOSPHATES 45 MMOLE/15ML IV SOLN
15.0000 mmol | Freq: Once | INTRAVENOUS | Status: AC
  Administered 2022-11-15: 15 mmol via INTRAVENOUS
  Filled 2022-11-15: qty 5

## 2022-11-15 MED ORDER — THIAMINE MONONITRATE 100 MG PO TABS
100.0000 mg | ORAL_TABLET | Freq: Every day | ORAL | Status: AC
  Administered 2022-11-15 – 2022-11-19 (×4): 100 mg
  Filled 2022-11-15 (×5): qty 1

## 2022-11-15 MED ORDER — VITAL 1.5 CAL PO LIQD
1000.0000 mL | ORAL | Status: DC
  Administered 2022-11-15 – 2022-11-20 (×6): 1000 mL

## 2022-11-15 MED ORDER — HEPARIN (PORCINE) 25000 UT/250ML-% IV SOLN
550.0000 [IU]/h | INTRAVENOUS | Status: DC
  Administered 2022-11-15: 550 [IU]/h via INTRAVENOUS
  Filled 2022-11-15: qty 250

## 2022-11-15 MED ORDER — ASPIRIN 325 MG PO TABS: 325.0000 mg | ORAL_TABLET | Freq: Once | ORAL | Status: DC

## 2022-11-15 MED ORDER — ASPIRIN 81 MG PO CHEW
81.0000 mg | CHEWABLE_TABLET | Freq: Every day | ORAL | Status: DC
  Filled 2022-11-15: qty 1

## 2022-11-15 MED ORDER — PROSOURCE TF20 ENFIT COMPATIBL EN LIQD
60.0000 mL | Freq: Every day | ENTERAL | Status: DC
  Administered 2022-11-15 – 2022-12-06 (×20): 60 mL
  Filled 2022-11-15 (×21): qty 60

## 2022-11-15 NOTE — Progress Notes (Addendum)
Covering cardmaster today. Pt seen by Dr. Harrington Challenger who requests Medtronic PPM interrogation. Request relayed to Leanna Sato, rep, who will contact Dr. Harrington Challenger with result. Rounding team also changed to CHF per d/w Lyda Jester.

## 2022-11-15 NOTE — Progress Notes (Signed)
Pharmacy Electrolyte Replacement  Recent Labs:  Recent Labs    11/15/22 0538  K 4.6  MG 2.7*  PHOS 2.0*  CREATININE 0.81    Low Critical Values (K </= 2.5, Phos </= 1, Mg </= 1) Present: None  MD Contacted: Dr. Lamonte Sakai   Plan: sodium phosphate 15 mmol IV x 1

## 2022-11-15 NOTE — Progress Notes (Signed)
Echocardiogram 2D Echocardiogram has been performed.  Oneal Deputy Bryson Gavia RDCS 11/15/2022, 8:02 AM  Dr. Margaretann Loveless notified of stat

## 2022-11-15 NOTE — H&P (View-Only) (Signed)
Advanced Heart Failure Team Consult Note   Primary Physician: Charlott Rakes, MD PCP-Cardiologist:  Fransico Him, MD  Reason for Consultation: acute biventricular heart failure, post VF arrest   HPI:    Cynthia Underwood is seen today for evaluation of acute biventricular heart failure, post VF arrest, at the request of Dr. Harrington Challenger, Cardiology.   52 y.o. female with a hx of tobacco use, COPD, myotonic dystrophy type I since age 28, high grade heart block s/p PPM, RBBB, sickle cell trait, and DM1. Has significant family history of heart failure. Relative of Vickie [place] ([facility] patient) and her father also had heart transplant. Has family history of sarcoidosis. She had a cMRI in 2017 that showed myocardial thinning associated with hypokinesis and subendocardial late gadolinium enhancement in the mid inferior and ineferolateral walls. She was referred for FDG-PET that was negative for evidence of sarcoidosis.   Last echo 01/2021 showed normal EF, 60-65%, RV normal.   Initially admitted 3/9 for acute hypoxic respiratory failure. She received ABX for LLL CAP secondary to metapneumovirus.   She was doing fairly well until yesterday evening. She became unresponsive and had Vfib arrest, required ~15 min of CPR + defibrillation, epi and amiodarone w/ ROSC.   Echo done today shows biventricular failure. LVEF severely reduced < 20% w/ diffuse HK. RV severely reduced.   Advanced HF team asked to further assist.   Hs trop 318>>1,359>>pending   Remains intubated and sedated. A-V paced w/ occasional PVCs but no further VT/VF. On NE 2. MAPs mid 80s. Renal fx preserved, Scr post arrest 0.81. 750 cc in UOP this shift.   Family present at bedside. Mother, sister and cousin.    Echo 11/14/21 1. LVEF is severely depressed with diffuse hypokinesis;  inferior/inferolateral akinesis Compared to echo from 2022 this is a  significant change. . Left ventricular ejection fraction, by estimation,  is  20%. The left ventricle has severely decreased  function.   2. Right ventricular systolic function is severely reduced. The right  ventricular size is normal. There is normal pulmonary artery systolic  pressure.   3. The mitral valve is normal in structure. Trivial mitral valve  regurgitation.   4. The aortic valve is normal in structure. Aortic valve regurgitation is  not visualized.   5. The inferior vena cava is dilated in size with <50% respiratory  variability, suggesting right atrial pressure of 15 mmHg.     Review of Systems: unable to obtain, currently intubated and sedated     Home Medications Prior to Admission medications   Medication Sig Start Date End Date Taking? Authorizing Provider  acetaminophen (TYLENOL) 500 MG tablet Take 1,000 mg by mouth every 6 (six) hours as needed for moderate pain.   Yes [provider]  albuterol (VENTOLIN HFA) 108 (90 Base) MCG/ACT inhaler INHALE 2 PUFFS INTO THE LUNGS EVERY 4 HOURS AS NEEDED FOR WHEEZING OR SHORTNESS OF BREATH Patient taking differently: Inhale 2 puffs into the lungs every 4 (four) hours as needed for shortness of breath or wheezing. 11/18/19  Yes Newlin, Enobong, MD  hydrocortisone (ANUSOL-HC) 2.5 % rectal cream PLACE 1 APPLICATION RECTALLY 2 (TWO) TIMES DAILY. Patient taking differently: Apply 1 Application topically 2 (two) times daily as needed for hemorrhoids or anal itching. 11/03/22  Yes Charlott Rakes, MD  ibuprofen (ADVIL) 200 MG tablet Take 600 mg by mouth every 6 (six) hours as needed for moderate pain.   Yes [provider]  levothyroxine (SYNTHROID) 112 MCG  tablet Take 1 tablet (112 mcg total) by mouth daily before breakfast. 10/06/22  Yes Charlott Rakes, MD  Misc. Devices MISC TubTransfer bench. Diagnosis - myotonic dystrophy 10/23/22   Charlott Rakes, MD    Past Medical History: Past Medical History:  Diagnosis Date   Bifascicular block 10/02/2014   Cataract    Dyspnea    Excess ear wax     Multinodular goiter    Pneumonia    Presence of permanent cardiac pacemaker    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   Sickle cell trait ([place])    Watery eyes    Left   Weakness of both legs     Past Surgical History: Past Surgical History:  Procedure Laterality Date   BREAST BIOPSY Right    EYE SURGERY Left 2017   "related to blockage in my nose"   EYE SURGERY Right 05/2019   INSERT / REPLACE / REMOVE PACEMAKER  02/15/2017   PACEMAKER IMPLANT N/A 02/15/2017   Procedure: Pacemaker Implant;  Surgeon: Deboraha Sprang, MD;  Location: [facility];  Service: Cardiovascular;  Laterality: N/A;   THYROIDECTOMY N/A 04/21/2021   Procedure: TOTAL THYROIDECTOMY;  Surgeon: Ralene Ok, MD;  Location: [facility];  Service: General;  Laterality: N/A;    Family History: Family History  Problem Relation Age of Onset   Cancer Father 38       Deceased   Heart disease Father    COPD Mother    Diabetes Maternal Uncle    Heart disease Maternal Uncle    Heart disease Paternal Grandmother    Diabetes Paternal Grandmother    Hypertension Paternal Grandmother    Neuromuscular disorder Maternal Aunt        Myotonic dystrophy   Neuromuscular disorder Cousin        Myotonic dystrophy   Neuromuscular disorder Sister        Myotonic dystrophy   Colon cancer Neg Hx    Colon polyps Neg Hx    Esophageal cancer Neg Hx    Rectal cancer Neg Hx    Stomach cancer Neg Hx     Social History: Social History   Socioeconomic History   Marital status: Single    Spouse name: Not on file   Number of children: 0   Years of education: 1.5 Collge   Highest education level: Not on file  Occupational History   Occupation: Unemployed    Employer: OTHER  Tobacco Use   Smoking status: Every Day    Years: 28.00    Types: Cigarettes    Last attempt to quit: 10/04/2016    Years since quitting: 6.1   Smokeless tobacco: Never   Tobacco comments:    3  cigarettes a day  Vaping Use   Vaping Use: Never used  Substance and Sexual Activity   Alcohol use: Not Currently    Alcohol/week: 0.0 standard drinks of alcohol    Comment:  few times a year   Drug use: Not Currently    Types: Marijuana    Comment: occ    Sexual activity: Not Currently    Birth control/protection: None, Condom  Other Topics Concern   Not on file  Social History Narrative   Patient lives at home with her aunt.   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  She moved to [facility] in August 2015.   She has been on disability since 2010.   Education: 1 1/2 years  of college.   Caffeine - coke 2 cans/day   Exercise - no   Social Determinants of Health   Financial Resource Strain: Low Risk  (10/16/2022)   Overall Financial Resource Strain (CARDIA)    Difficulty of Paying Living Expenses: Not hard at all  Food Insecurity: No Food Insecurity (10/16/2022)   Hunger Vital Sign    Worried About Running Out of Food in the Last Year: Never true    Ran Out of Food in the Last Year: Never true  Transportation Needs: No Transportation Needs (10/16/2022)   PRAPARE - Hydrologist (Medical): No    Lack of Transportation (Non-Medical): No  Physical Activity: Inactive (10/16/2022)   Exercise Vital Sign    Days of Exercise per Week: 0 days    Minutes of Exercise per Session: 0 min  Stress: Stress Concern Present (10/16/2022)   [place]    Feeling of Stress : To some extent  Social Connections: Socially Isolated (10/16/2022)   Social Connection and Isolation Panel [NHANES]    Frequency of Communication with Friends and Family: Three times a week    Frequency of Social Gatherings with Friends and Family: Three times a week    Attends Religious Services: Never    Active Member of Clubs or Organizations: No    Attends Archivist Meetings: Never    Marital Status:  Never married    Allergies:  Allergies  Allergen Reactions   Penicillin G Other (See Comments), Rash and Nausea And Vomiting    Sore throat   Penicillins Nausea And Vomiting, Rash and Other (See Comments)    Fever Has patient had a PCN reaction causing immediate rash, facial/tongue/throat swelling, SOB or lightheadedness with hypotension: Yes Has patient had a PCN reaction causing severe rash involving mucus membranes or skin necrosis: Unknown Has patient had a PCN reaction that required hospitalization: No Has patient had a PCN reaction occurring within the last 10 years: No If all of the above answers are "NO", then may proceed with Cephalosporin use.     Objective:    Vital Signs:   Temp:  [96.5 F (35.8 C)-99 F (37.2 C)] 99 F (37.2 C) (03/13 0830) Pulse Rate:  [60-114] 60 (03/13 1232) Resp:  [13-36] 17 (03/13 1232) BP: (112-130)/(77-93) 130/81 (03/13 0308) SpO2:  [55 %-100 %] 100 % (03/13 1232) FiO2 (%):  [40 %-100 %] 40 % (03/13 1232) Last BM Date :  (PTA)  Weight change: Filed Weights   11/11/22 1211  Weight: 44.5 kg    Intake/Output:   Intake/Output Summary (Last 24 hours) at 11/15/2022 1348 Last data filed at 11/15/2022 1200 Gross per 24 hour  Intake 3036.63 ml  Output 800 ml  Net 2236.63 ml      Physical Exam    General:  intubated and sedated, thin AAF HEENT: normal + ETT + cortrak  Neck: supple. JVP not well visualized. Left IJ CVC, Carotids 2+ bilat; no bruits. No lymphadenopathy or thyromegaly appreciated. Cor: PMI nondisplaced. Regular rate & rhythm. No rubs, gallops or murmurs. Lungs: intubated and clear  Abdomen: soft, nontender, nondistended. No hepatosplenomegaly. No bruits or masses. Good bowel sounds. Extremities: no cyanosis, clubbing, rash, edema, luke warm ext  Neuro: intubated and sedated    Telemetry   A-V dual-paced  60s. PVCs but no further VT/VF  EKG    A-v dual paced 60 bpm, prolonged QTc 624 ms   Labs  Basic  Metabolic Panel: Recent Labs  Lab 11/12/22 0352 11/13/22 0215 11/14/22 0422 11/14/22 2000 11/14/22 2002 11/15/22 0103 11/15/22 0538  NA 143 143 139 139 141 137 139  K 3.4* 3.2* 4.0 3.2* 3.4* 4.5 4.6  CL 106 111 105 111  --   --  107  CO2 '25 25 23 '$ 15*  --   --  20*  GLUCOSE 180* 91 77 211*  --   --  164*  BUN 12 10 5* <5*  --   --  7  CREATININE 0.63 0.74 0.75 0.86  --   --  0.81  CALCIUM 8.4* 8.5* 9.1 8.7*  --   --  9.0  MG 2.3 2.2  --  1.7  --   --  2.7*  PHOS 2.4*  --   --  5.0*  --   --  2.0*    Liver Function Tests: Recent Labs  Lab 11/11/22 1238 11/13/22 0215 11/14/22 0422 11/14/22 2000 11/15/22 0538  AST '23 18 18 '$ 254* 445*  ALT '14 11 12 '$ 230* 226*  ALKPHOS 36* 31* 37* 61 48  BILITOT 0.5 0.3 0.3 0.2* 0.4  PROT 6.5 5.6* 6.5 5.2* 6.0*  ALBUMIN 3.2* 2.6* 3.1* 2.4* 2.7*   No results for input(s): "LIPASE", "AMYLASE" in the last 168 hours. No results for input(s): "AMMONIA" in the last 168 hours.  CBC: Recent Labs  Lab 11/11/22 1238 11/11/22 2045 11/12/22 0352 11/14/22 0023 11/14/22 2000 11/14/22 2002 11/15/22 0103 11/15/22 0538  WBC 5.9 5.3 5.1 6.6 21.0*  --   --  17.3*  NEUTROABS 4.7  --   --   --   --   --   --   --   HGB 11.8* 11.7* 10.2* 13.0 11.8* 11.9* 12.6 12.2  HCT 36.3 35.6* 30.9* 40.4 35.3* 35.0* 37.0 34.7*  MCV 101.4* 99.2 96.9 97.3 97.8  --   --  93.3  PLT 226 226 223 282 282  --   --  247    Cardiac Enzymes: No results for input(s): "CKTOTAL", "CKMB", "CKMBINDEX", "TROPONINI" in the last 168 hours.  BNP: BNP (last 3 results) Recent Labs    11/11/22 1238  BNP 58.5    ProBNP (last 3 results) No results for input(s): "PROBNP" in the last 8760 hours.   CBG: Recent Labs  Lab 11/14/22 1920 11/14/22 2325 11/15/22 0340 11/15/22 0752 11/15/22 1108  GLUCAP 142* 340* 172* 135* 142*    Coagulation Studies: Recent Labs    11/14/22 2000  LABPROT 18.4*  INR 1.5*     Imaging   ECHOCARDIOGRAM COMPLETE  Result Date:  11/15/2022    ECHOCARDIOGRAM REPORT   Patient Name:   Cynthia Underwood Date of Exam: 11/15/2022 Medical Rec #:  [id number]         Height:       63.0 in Accession #:    [id number]        Weight:       98.0 lb Date of Birth:  1971/05/23        BSA:          1.428 m Patient Age:    62 years          BP:           128/82 mmHg Patient Gender: F                 HR:           61 bpm. Exam Location:  Inpatient Procedure: 2D Echo, Color Doppler and Cardiac Doppler STAT ECHO Indications:    Cardiac Arrest i46.9  History:        Patient has prior history of Echocardiogram examinations, most                 recent 01/13/2021. Pacemaker; Arrythmias:RBBB.  Sonographer:    Raquel Sarna Senior RDCS Referring Phys: [place]  Sonographer Comments: Technically difficult study due to thin body habitus, scanned supine on artificial respirator. IMPRESSIONS  1. LVEF is severely depressed with diffuse hypokinesis; inferior/inferolateral akinesis Compared to echo from 2022 this is a significant change. . Left ventricular ejection fraction, by estimation, is 20%. The left ventricle has severely decreased function.  2. Right ventricular systolic function is severely reduced. The right ventricular size is normal. There is normal pulmonary artery systolic pressure.  3. The mitral valve is normal in structure. Trivial mitral valve regurgitation.  4. The aortic valve is normal in structure. Aortic valve regurgitation is not visualized.  5. The inferior vena cava is dilated in size with <50% respiratory variability, suggesting right atrial pressure of 15 mmHg. FINDINGS  Left Ventricle: LVEF is severely depressed with diffuse hypokinesis; inferior/inferolateral akinesis Compared to echo from 2022 this is a significant change. Left ventricular ejection fraction, by estimation, is 20%. The left ventricle has severely decreased function. The left ventricular internal cavity size was normal in size. There is no left ventricular hypertrophy. Right  Ventricle: The right ventricular size is normal. Right vetricular wall thickness was not assessed. Right ventricular systolic function is severely reduced. There is normal pulmonary artery systolic pressure. The tricuspid regurgitant velocity is 1.79 m/s, and with an assumed right atrial pressure of 15 mmHg, the estimated right ventricular systolic pressure is 0000000 mmHg. Left Atrium: Left atrial size was normal in size. Right Atrium: Right atrial size was normal in size. Pericardium: There is no evidence of pericardial effusion. Mitral Valve: The mitral valve is normal in structure. Trivial mitral valve regurgitation. Tricuspid Valve: The tricuspid valve is normal in structure. Tricuspid valve regurgitation is mild. Aortic Valve: The aortic valve is normal in structure. Aortic valve regurgitation is not visualized. Pulmonic Valve: The pulmonic valve was normal in structure. Pulmonic valve regurgitation is mild. Aorta: The aortic root and ascending aorta are structurally normal, with no evidence of dilitation. Venous: The inferior vena cava is dilated in size with less than 50% respiratory variability, suggesting right atrial pressure of 15 mmHg. IAS/Shunts: No atrial level shunt detected by color flow Doppler.  LEFT VENTRICLE PLAX 2D LVIDd:         2.75 cm     Diastology LVIDs:         2.80 cm     LV e' medial:    3.48 cm/s LV PW:         0.80 cm     LV E/e' medial:  11.3 LV IVS:        0.70 cm     LV e' lateral:   4.13 cm/s LVOT diam:     1.80 cm     LV E/e' lateral: 9.5 LV SV:         25 LV SV Index:   17 LVOT Area:     2.54 cm  LV Volumes (MOD) LV vol d, MOD A2C: 53.5 ml LV vol d, MOD A4C: 58.4 ml LV vol s, MOD A2C: 36.2 ml LV vol s, MOD A4C: 38.2 ml LV SV MOD A2C:  17.3 ml LV SV MOD A4C:     58.4 ml LV SV MOD BP:      21.4 ml RIGHT VENTRICLE RV S prime:     5.98 cm/s TAPSE (M-mode): 0.8 cm LEFT ATRIUM           Index       RIGHT ATRIUM          Index LA diam:      1.40 cm 0.98 cm/m  RA Area:     7.91 cm LA  Vol (A2C): 10.1 ml 7.07 ml/m  RA Volume:   14.90 ml 10.43 ml/m LA Vol (A4C): 7.0 ml  4.90 ml/m  AORTIC VALVE LVOT Vmax:   54.30 cm/s LVOT Vmean:  36.700 cm/s LVOT VTI:    0.098 m  AORTA Ao Root diam: 2.70 cm Ao Asc diam:  2.60 cm MITRAL VALVE               TRICUSPID VALVE MV Area (PHT): 2.79 cm    TR Peak grad:   12.8 mmHg MV Decel Time: 272 msec    TR Vmax:        179.00 cm/s MV E velocity: 39.30 cm/s MV A velocity: 75.50 cm/s  SHUNTS MV E/A ratio:  0.52        Systemic VTI:  0.10 m                            Systemic Diam: 1.80 cm Dorris Carnes MD Electronically signed by Dorris Carnes MD Signature Date/Time: 11/15/2022/9:26:43 AM    Final    DG Chest Port 1 View  Result Date: 11/15/2022 CLINICAL DATA:  status post intubation. EXAM: PORTABLE CHEST 1 VIEW COMPARISON:  11/14/2022 FINDINGS: ETT tip is stable above the carina. There is a left IJ catheter with tip terminating in the superior cavoatrial junction. Dual lead left chest wall pacer noted. Heart size appears normal. No pleural effusion or edema. No airspace opacities. Signs of previous thyroidectomy. Visualized osseous structures are unremarkable. IMPRESSION: 1. Stable support apparatus. 2. No acute cardiopulmonary abnormalities. Electronically Signed   By: Kerby Moors M.D.   On: 11/15/2022 07:35   DG CHEST PORT 1 VIEW  Result Date: 11/14/2022 CLINICAL DATA:  Reason for exam: Encounter for central line placement EXAM: PORTABLE CHEST - 1 VIEW COMPARISON:  Earlier film of the same day FINDINGS: Interval placement of left IJ central venous catheter to the proximal right atrium. No pneumothorax. The endotracheal tube is been reposition, tip now 4.3 cm above carina. Gastric tube remains in the stomach. Left subclavian dual lead pacemaker stable. Lungs clear. Heart size and mediastinal contours are within normal limits. No effusion. Visualized bones unremarkable. Surgical clips at the thoracic inlet. IMPRESSION: Left IJ central venous catheter to the  proximal right atrium. No pneumothorax. Electronically Signed   By: Lucrezia Europe M.D.   On: 11/14/2022 21:30   DG CHEST PORT 1 VIEW  Addendum Date: 11/14/2022   ADDENDUM REPORT: 11/14/2022 20:01 ADDENDUM: These results were called by telephone at the time of interpretation on 11/14/2022 at 8:01 pm to provider Nevada Crane, physician assistant, who verbally acknowledged these results. Electronically Signed   By: Ronney Asters M.D.   On: 11/14/2022 20:01   Result Date: 11/14/2022 CLINICAL DATA:  Cardiac arrest EXAM: PORTABLE CHEST 1 VIEW COMPARISON:  Chest x-ray 11/14/2022 FINDINGS: There is right mainstem intubation. Enteric tube extends into the mid stomach. Left-sided pacemaker is again seen.  The cardiomediastinal silhouette is within normal limits. There central pulmonary vascular congestion. There is no pleural effusion or pneumothorax visualized on this supine view. No acute fractures are identified. IMPRESSION: 1. Right mainstem intubation.  Recommend retraction proximally 5 cm. 2. Central pulmonary vascular congestion. 3. Enteric tube extends into the mid stomach. Electronically Signed: By: Ronney Asters M.D. On: 11/14/2022 19:50   DG Abd 1 View  Result Date: 11/14/2022 CLINICAL DATA:  Enteric catheter placement EXAM: ABDOMEN - 1 VIEW COMPARISON:  None Available. FINDINGS: Frontal view of the lower chest and upper abdomen demonstrates enteric catheter passing below diaphragm tip and side port projecting over the gastric body. External defibrillator pads overlie the lower chest. Stable position of the dual lead pacemaker. Patchy consolidation at the left lung base. Moderate gaseous distention of the small bowel measuring up to 2.6 cm in diameter. IMPRESSION: 1. Enteric catheter tip and side port projecting over the gastric body. 2. Gaseous distension of the proximal small bowel, incompletely imaged on this exam. This could reflect distal small bowel obstruction or ileus. 3. Patchy left basilar  airspace disease. Electronically Signed   By: Randa Ngo M.D.   On: 11/14/2022 19:52     Medications:     Current Medications:  [START ON 11/16/2022] aspirin  81 mg Per Tube Daily   Chlorhexidine Gluconate Cloth  6 each Topical Daily   insulin aspart  0-9 Units Subcutaneous Q4H   levothyroxine  112 mcg Per Tube QAC breakfast   methylPREDNISolone (SOLU-MEDROL) injection  40 mg Intravenous Daily   mouth rinse  15 mL Mouth Rinse Q2H   pantoprazole (PROTONIX) IV  40 mg Intravenous QHS   sucralfate  1 g Per Tube BID    Infusions:  cefTRIAXone (ROCEPHIN)  IV Stopped (11/14/22 2352)   dexmedetomidine (PRECEDEX) IV infusion 0.5 mcg/kg/hr (11/15/22 1200)   fentaNYL infusion INTRAVENOUS 75 mcg/hr (11/15/22 1200)   heparin 550 Units/hr (11/15/22 1200)   lactated ringers 50 mL/hr at 11/15/22 1200   norepinephrine (LEVOPHED) Adult infusion 2 mcg/min (11/15/22 1200)   sodium phosphate 15 mmol in dextrose 5 % 250 mL infusion 43 mL/hr at 11/15/22 1200      Patient Profile   52 y.o. female with a hx of tobacco use, COPD, myotonic dystrophy type I since age 51, high grade heart block s/p PPM, RBBB, sickle cell trait, and DM1 (questionable diagnosis) and strong family history of heart failure (father s/p cardiac transplant and paternal aunt w/ NICM w/ LVAD), admitted w/ acute hypoxic respiratory failure/ LLL CAP secondary to metapneumovirus. Hospital course c/b VF arrest. Post arrest echo w/ severe BiV failure, LVEF < 20%, RV severely reduced + diffuse HK.   Assessment/Plan   1. Acute Systolic Heart Failure - New diagnosis  - cMRI 2017 LVEF 56%, RVEF 56% + myocardial thinning and subendocardial LGE in the mid inferior and ineferolateral walls>>PET negative for sarcoid  - Echo 2022 EF 60-65%, RV normal - Echo post VF arrest EF < 20%, RV severely reduced w/ diffuse HK  - Hs trop 318>>1,359>>pending  - Biventricular failure in setting of acute metapneumovirus, VF arrest + troponin elevation  concerning for possible viral myocarditis  - CM may also be 2/2 myotonic dystrophy - will need [facility] tomorrow. If no evidence of significant coronary disease will need cMRI  - no GDMT for now given soft BP requiring NE support (currently on 2 mcg)  - Volume status ok, CVP 6 - check co-ox  - has PPM for CHB 2/2  myotonic dystrophy. Will need LifeVest at D/c and ultimate upgrade to ICD if EF does not improve in 3 months    2. In Hospital VF Arrest  - immediate ACLs w/ ROSC ~15 min  - currently sedated but RN reports pt was following commands earlier w/ sedation wean  - no further high grade arrhythmias on tele. Will avoid amio for now w/ prolonged QT (624 ms on EKG)  - keep K > 4.0 and Mg > 2.0  - plan cath tomorrow to r/o CAD  3. Acute Hypoxic Respiratory Failure - LLL CAP secondary to metapneumovirus - intubated during VF arrest  - vent management, abx and steroids per PCCM    Length of Stay: 1  Brittainy Simmons, PA-C  11/15/2022, 1:48 PM  Advanced Heart Failure Team Pager [phone] (M-F; 7a - 5p)  Please contact [facility] for night-coverage after hours (4p -7a ) and weekends on [email or web address]  Patient seen with PA, agree with the above note.   Patient has history of myotonic dystrophy and high grade heart block with [employer] PPM with his bundle lead.  Echo in 5/22 showed normal EF.  She had a workup for cardiac sarcoidosis in the past, FDG-PET was not suggestive of inflammation.  Cardiac MRI in 10/17 showed subendocardial LGE in the mid inferior and inferolateral walls with EF 56%.   She was admitted with dyspnea, LLL PNA and found to have metapneumovirus infection. Yesterday evening, she had VF arrest and was shocked back to NSR.  She was intubated post-arrest.   Echo was done, showing EF 20-25% range with diffuse hypokinesis but relatively preserved function in the apex, moderate RV dysfunction.   She is currently intubated, on NE 1.  She is not on amiodarone, QTc is  prolonged on ECG.   General: Intubated, sedated.  Neck: No JVD, no thyromegaly or thyroid nodule.  Lungs: Clear to auscultation bilaterally with normal respiratory effort. CV: Nondisplaced PMI.  Heart regular S1/S2, no S3/S4, no murmur.  No peripheral edema.  Abdomen: Soft, nontender, no hepatosplenomegaly, no distention.  Skin: Intact without lesions or rashes.  Neurologic: Sedated.  Extremities: No clubbing or cyanosis.  HEENT: Normal.   1. Acute hypoxemic respiratory failure: Intubated post-VF arrest.  Had viral PNA with metapneumovirus at presentation.  - Per CCM.  Plan currently is to keep intubated until after cath.  - On Solumedrol for bronchospasm.  2. High grade heart block: Has MDT PPM with His bundle lead.  This is likely due to cardiac involvement of myotonic dystrophy type 1.  3. VF arrest: 3/12, shocked. 15 minutes CPR total.  - Low threshold to restart amiodarone if further ectopy.  - Plan right/left cath tomorrow (see below), will need to eventually involve EP if cath negative for secondary prevention ICD upgrade.  4. Acute systolic CHF: Prior workup for cardiac sarcoidosis. Cardiac MRI in 2017 showed subendocardial LGE in the mid inferior and inferolateral walls with EF 56%.  Cardiac PET done after this was NOT suggestive of cardiac sarcoidosis.   Echo in 5/22 with normal EF.  Echo this admission reviewed, EF 20-25% range with diffuse hypokinesis but relatively preserved function at the apex, moderate RV dysfunction.  Cause of cardiomyopathy is uncertain.  She is a smoker and noted to be hyperglycemic this admission.  There is also some regionality to her wall motion abnormalities on echo though not clear it is in a CAD pattern.  Cannot rule out ischemic cardiomyopathy.  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  She has an aunt  with LVAD who has cardiomyopathy due to myotonic dystrophy.  Father had a heart transplant, uncertain of underlying condition.  She is on NE 1 currently, can likely be titrated off.  - LHC/RHC tomorrow.  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  Discussed risks/benefits of cath with daughter and mother (patient intubated), they agree to procedure.  - If no significant coronary disease, would eventually repeat cardiac MRI.  LGE pattern is often mid-myocardial inferolateral wall with myotonic dystrophy dilated cardiomyopathy.  - She has MDT PPM with his bundle lead but paced QRS is very wide.  If no revascularizable coronary disease, would recommend upgrade of device to CRT-D (need more narrow paced QRS than we are getting with her his pacing).  5. CAD: HS-TnI peaked at 1359 in setting of VF arrest then trended down.  No prior chest pain.   - As above, needs coronary angiography to rule out CAD.  6. Elevated LFTs: Trending down, suspect shock liver/hypoperfusion in setting of VF arrest.  7. Smoker: She will need to quit.  8. Sickle cell trait 9. Hyperglycemia: Patient does not carry the diagnosis of diabetes but blood glucose has been high in the hospital, using insulin.   - Check hgbA1c.  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 Has family members with the same diagnosis.  Followed by neurology.   Loralie Champagne 11/15/2022 6:04 PM

## 2022-11-15 NOTE — Progress Notes (Signed)
ANTICOAGULATION CONSULT NOTE - Initial Consult  Pharmacy Consult for Heparin Indication: chest pain/ACS  Allergies  Allergen Reactions   Penicillin G Other (See Comments), Rash and Nausea And Vomiting    Sore throat   Penicillins Nausea And Vomiting, Rash and Other (See Comments)    Fever Has patient had a PCN reaction causing immediate rash, facial/tongue/throat swelling, SOB or lightheadedness with hypotension: Yes Has patient had a PCN reaction causing severe rash involving mucus membranes or skin necrosis: Unknown Has patient had a PCN reaction that required hospitalization: No Has patient had a PCN reaction occurring within the last 10 years: No If all of the above answers are "NO", then may proceed with Cephalosporin use.     Patient Measurements: Height: '5\' 3"'$  (160 cm) Weight: 44.5 kg (98 lb) IBW/kg (Calculated) : 52.4  Vital Signs: Temp: 96.5 F (35.8 C) (03/12 2330) Temp Source: Axillary (03/12 2330) BP: 114/77 (03/12 2305) Pulse Rate: 64 (03/13 0106)  Labs: Recent Labs    11/12/22 0352 11/13/22 0215 11/14/22 0023 11/14/22 0422 11/14/22 2000 11/14/22 2002 11/14/22 2318 11/15/22 0103  HGB 10.2*  --  13.0  --  11.8* 11.9*  --  12.6  HCT 30.9*  --  40.4  --  35.3* 35.0*  --  37.0  PLT 223  --  282  --  282  --   --   --   LABPROT  --   --   --   --  18.4*  --   --   --   INR  --   --   --   --  1.5*  --   --   --   CREATININE 0.63 0.74  --  0.75 0.86  --   --   --   TROPONINIHS  --   --   --   --  318*  --  1,359*  --     Estimated Creatinine Clearance: 53.1 mL/min (by C-G formula based on SCr of 0.86 mg/dL).   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   Past Week   albuterol (VENTOLIN HFA) 108 (90 Base) MCG/ACT inhaler INHALE 2 PUFFS INTO THE LUNGS EVERY 4 HOURS AS NEEDED FOR WHEEZING OR SHORTNESS OF BREATH (Patient taking differently: Inhale 2 puffs into the lungs every 4 (four) hours as needed for shortness of breath or wheezing.) 18 g 1 Past Week   hydrocortisone (ANUSOL-HC) 2.5 % rectal cream PLACE 1 APPLICATION RECTALLY 2 (TWO) TIMES DAILY. (Patient taking differently: Apply 1 Application topically 2 (two) times daily as needed for hemorrhoids or anal itching.) 30 g 3 Unk   ibuprofen (ADVIL) 200 MG tablet Take 600 mg by mouth every 6 (six) hours as needed for moderate pain.   Past Week   levothyroxine (SYNTHROID) 112 MCG tablet Take 1 tablet (112 mcg total) by mouth daily  before breakfast. 90 tablet 1 Past Week   Misc. Devices MISC TubTransfer bench. Diagnosis - myotonic dystrophy 1 each 0     Assessment: 52 y.o. female admitted s/p VFib arrest for heparin  Goal of Therapy:  Heparin level 0.3-0.7 units/ml Monitor platelets by anticoagulation protocol: Yes   Plan:  Heparin 2000 units IV bolus, then start heparin 550 units/hr Check heparin level in 6 hours.   Caryl Pina 11/15/2022,1:12 AM

## 2022-11-15 NOTE — Progress Notes (Signed)
eLink Physician-Brief Progress Note Patient Name: Cynthia Underwood DOB: Sep 18, 1970 MRN: [id number]   Date of Service  11/15/2022  HPI/Events of Note  Lactic acid 3.0,  Troponin 1350  eICU Interventions  Will trend lactic acid, Troponin,  and order Aspirin for possible ACS , given history of v-fib preceding cardiac arrest and significantly elevated Troponin possibly beyond what would be accounted for by cardiac arrest s/p CPR, will defer to cardiology who are following regarding empiric heparinization, echocardiogram is ordered for the AM already.        Frederik Pear 11/15/2022, 12:33 AM

## 2022-11-15 NOTE — Progress Notes (Addendum)
Initial Nutrition Assessment  DOCUMENTATION CODES:   Severe malnutrition in context of chronic illness, Underweight  INTERVENTION:  Initiate tube feeding via cortrak tube: Vital 1.5 at 45 ml/h (1080 ml per day) Start at 61m/h and advance by [id number] to goal Prosource TF20 60 ml 1x/d Provides 1700 kcal, 93 gm protein, 825 ml free water daily Thiamine '100mg'$  x 5 days for refeeding risk  NUTRITION DIAGNOSIS:   Severe Malnutrition related to chronic illness (myotonic dystrophy) as evidenced by severe fat depletion, severe muscle depletion.  GOAL:   Patient will meet greater than or equal to 90% of their needs  MONITOR:   TF tolerance, I & O's, Vent status, Labs, Weight trends  REASON FOR ASSESSMENT:   Ventilator, Consult Enteral/tube feeding initiation and management  ASSESSMENT:  Pt with hx of myotonic dystrophy and hypothyroidism s/p thyroidectomy presented to ED with worsening cough and diarrhea for 4 days PTA. Found to have pneumonia related to metapneumovirus  3/12 - code blue called for VF arrest, intubated and transferred to ICU  Pt resting in bed at the time of assessment. Patient is currently intubated on ventilator support. Sister at bedside reports that pt was not eating well prior to intubation and generally has a poor appetite at baseline. Underweight with significant muscle and fat depletions on exam. Sister reports that overtime, pt has lost a lot of weight and reports trouble swallowing at baseline.  Currently has OGT in place. Discussed with family and attending about exchanging for cortrak tube (terminates gastric antrum) and beginning enteral feeds.  Noted that pt is now being followed by advanced heart failure team. EF <20% after arrest.  Will initiate feeds now that tube is in place. Monitor magnesium and phosphorus every 12 hours x 4 occurrences, MD to replete as needed, as pt is at risk for refeeding syndrome given malnutrition and poor PO  status.   MV: 6.9 L/min Temp (24hrs), Avg:97.8 F (36.6 C), Min:96.5 F (35.8 C), Max:99 F (37.2 C)   Intake/Output Summary (Last 24 hours) at 11/15/2022 1524 Last data filed at 11/15/2022 1400 Gross per 24 hour  Intake 3279.63 ml  Output 1050 ml  Net 2229.63 ml  Net IO Since Admission: 3,814.91 mL [11/15/22 1524]  Nutritionally Relevant Medications: Scheduled Meds:  azithromycin  250 mg Per Tube Daily   docusate  100 mg Per Tube BID   insulin aspart  0-9 Units Subcutaneous Q4H   methylPREDNISolone injection  40 mg Intravenous Daily   pantoprazole (PROTONIX) IV  40 mg Intravenous QHS   polyethylene glycol  17 g Per Tube Daily   sucralfate  1 g Per Tube BID   Continuous Infusions:  cefTRIAXone (ROCEPHIN)  IV Stopped (11/14/22 2352)   sodium phosphate 15 mmol in dextrose 5 % 250 mL infusion     PRN Meds: ondansetron  Labs Reviewed: Phosphorus 2.0 Mg 2.7 CBG ranges from 89-340 mg/dL over the last 24 hours  NUTRITION - FOCUSED PHYSICAL EXAM:  Flowsheet Row Most Recent Value  Orbital Region Severe depletion  Upper Arm Region Severe depletion  Thoracic and Lumbar Region Severe depletion  Buccal Region Severe depletion  Temple Region Moderate depletion  Clavicle Bone Region Severe depletion  Clavicle and Acromion Bone Region Severe depletion  Scapular Bone Region Moderate depletion  Dorsal Hand Unable to assess  [mittens]  Patellar Region Severe depletion  Anterior Thigh Region Severe depletion  Posterior Calf Region Severe depletion  Edema (RD Assessment) None  Hair Reviewed  Eyes Reviewed  Mouth  Reviewed  Skin Reviewed  Nails Unable to assess   Diet Order:   Diet Order             Diet NPO time specified  Diet effective now                   EDUCATION NEEDS:   Not appropriate for education at this time  Skin:  Skin Assessment: Reviewed RN Assessment  Last BM:  unsure  Height:   Ht Readings from Last 1 Encounters:  11/11/22 '5\' 3"'$  (1.6 m)     Weight:   Wt Readings from Last 1 Encounters:  11/11/22 44.5 kg    Ideal Body Weight:  52.3 kg  BMI:  Body mass index is 17.36 kg/m.  Estimated Nutritional Needs:  Kcal:  1600-1800 kcal/d Protein:  80-95 g/d Fluid:  >/=1.8L/d    Ranell Patrick, RD, LDN Clinical Dietitian RD pager # available in [facility]  After hours/weekend pager # available in [facility]

## 2022-11-15 NOTE — Consult Note (Addendum)
Advanced Heart Failure Team Consult Note   Primary Physician: Charlott Rakes, MD PCP-Cardiologist:  Fransico Him, MD  Reason for Consultation: acute biventricular heart failure, post VF arrest   HPI:    Cynthia Underwood is seen today for evaluation of acute biventricular heart failure, post VF arrest, at the request of Dr. Harrington Challenger, Cardiology.   52 y.o. female with a hx of tobacco use, COPD, myotonic dystrophy type I since age 25, high grade heart block s/p PPM, RBBB, sickle cell trait, and DM1. Has significant family history of heart failure. Relative of Cynthia Underwood (VAD patient) and her father also had heart transplant. Has family history of sarcoidosis. She had a cMRI in 2017 that showed myocardial thinning associated with hypokinesis and subendocardial late gadolinium enhancement in the mid inferior and ineferolateral walls. She was referred for FDG-PET that was negative for evidence of sarcoidosis.   Last echo 01/2021 showed normal EF, 60-65%, RV normal.   Initially admitted 3/9 for acute hypoxic respiratory failure. She received ABX for LLL CAP secondary to metapneumovirus.   She was doing fairly well until yesterday evening. She became unresponsive and had Vfib arrest, required ~15 min of CPR + defibrillation, epi and amiodarone w/ ROSC.   Echo done today shows biventricular failure. LVEF severely reduced < 20% w/ diffuse HK. RV severely reduced.   Advanced HF team asked to further assist.   Hs trop 318>>1,359>>pending   Remains intubated and sedated. A-V paced w/ occasional PVCs but no further VT/VF. On NE 2. MAPs mid 80s. Renal fx preserved, Scr post arrest 0.81. 750 cc in UOP this shift.   Family present at bedside. Mother, sister and cousin.    Echo 11/14/21 1. LVEF is severely depressed with diffuse hypokinesis;  inferior/inferolateral akinesis Compared to echo from 2022 this is a  significant change. . Left ventricular ejection fraction, by estimation,  is  20%. The left ventricle has severely decreased  function.   2. Right ventricular systolic function is severely reduced. The right  ventricular size is normal. There is normal pulmonary artery systolic  pressure.   3. The mitral valve is normal in structure. Trivial mitral valve  regurgitation.   4. The aortic valve is normal in structure. Aortic valve regurgitation is  not visualized.   5. The inferior vena cava is dilated in size with <50% respiratory  variability, suggesting right atrial pressure of 15 mmHg.     Review of Systems: unable to obtain, currently intubated and sedated     Home Medications Prior to Admission medications   Medication Sig Start Date End Date Taking? Authorizing Provider  acetaminophen (TYLENOL) 500 MG tablet Take 1,000 mg by mouth every 6 (six) hours as needed for moderate pain.   Yes [provider]  albuterol (VENTOLIN HFA) 108 (90 Base) MCG/ACT inhaler INHALE 2 PUFFS INTO THE LUNGS EVERY 4 HOURS AS NEEDED FOR WHEEZING OR SHORTNESS OF BREATH Patient taking differently: Inhale 2 puffs into the lungs every 4 (four) hours as needed for shortness of breath or wheezing. 11/18/19  Yes Newlin, Enobong, MD  hydrocortisone (ANUSOL-HC) 2.5 % rectal cream PLACE 1 APPLICATION RECTALLY 2 (TWO) TIMES DAILY. Patient taking differently: Apply 1 Application topically 2 (two) times daily as needed for hemorrhoids or anal itching. 11/03/22  Yes Charlott Rakes, MD  ibuprofen (ADVIL) 200 MG tablet Take 600 mg by mouth every 6 (six) hours as needed for moderate pain.   Yes [provider]  levothyroxine (SYNTHROID) 112 MCG  tablet Take 1 tablet (112 mcg total) by mouth daily before breakfast. 10/06/22  Yes Charlott Rakes, MD  Misc. Devices MISC TubTransfer bench. Diagnosis - myotonic dystrophy 10/23/22   Charlott Rakes, MD    Past Medical History: Past Medical History:  Diagnosis Date   Bifascicular block 10/02/2014   Cataract    Dyspnea    Excess ear wax     Multinodular goiter    Pneumonia    Presence of permanent cardiac pacemaker    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   Sickle cell trait ([place])    Watery eyes    Left   Weakness of both legs     Past Surgical History: Past Surgical History:  Procedure Laterality Date   BREAST BIOPSY Right    EYE SURGERY Left 2017   "related to blockage in my nose"   EYE SURGERY Right 05/2019   INSERT / REPLACE / REMOVE PACEMAKER  02/15/2017   PACEMAKER IMPLANT N/A 02/15/2017   Procedure: Pacemaker Implant;  Surgeon: Deboraha Sprang, MD;  Location: [facility];  Service: Cardiovascular;  Laterality: N/A;   THYROIDECTOMY N/A 04/21/2021   Procedure: TOTAL THYROIDECTOMY;  Surgeon: Ralene Ok, MD;  Location: [facility];  Service: General;  Laterality: N/A;    Family History: Family History  Problem Relation Age of Onset   Cancer Father 65       Deceased   Heart disease Father    COPD Mother    Diabetes Maternal Uncle    Heart disease Maternal Uncle    Heart disease Paternal Grandmother    Diabetes Paternal Grandmother    Hypertension Paternal Grandmother    Neuromuscular disorder Maternal Aunt        Myotonic dystrophy   Neuromuscular disorder Cousin        Myotonic dystrophy   Neuromuscular disorder Sister        Myotonic dystrophy   Colon cancer Neg Hx    Colon polyps Neg Hx    Esophageal cancer Neg Hx    Rectal cancer Neg Hx    Stomach cancer Neg Hx     Social History: Social History   Socioeconomic History   Marital status: Single    Spouse name: Not on file   Number of children: 0   Years of education: 1.5 Collge   Highest education level: Not on file  Occupational History   Occupation: Unemployed    Employer: OTHER  Tobacco Use   Smoking status: Every Day    Years: 28.00    Types: Cigarettes    Last attempt to quit: 10/04/2016    Years since quitting: 6.1   Smokeless tobacco: Never   Tobacco comments:    3  cigarettes a day  Vaping Use   Vaping Use: Never used  Substance and Sexual Activity   Alcohol use: Not Currently    Alcohol/week: 0.0 standard drinks of alcohol    Comment:  few times a year   Drug use: Not Currently    Types: Marijuana    Comment: occ    Sexual activity: Not Currently    Birth control/protection: None, Condom  Other Topics Concern   Not on file  Social History Narrative   Patient lives at home with her aunt.   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  She moved to [facility] in August 2015.   She has been on disability since 2010.   Education: 1 1/2 years  of college.   Caffeine - coke 2 cans/day   Exercise - no   Social Determinants of Health   Financial Resource Strain: Low Risk  (10/16/2022)   Overall Financial Resource Strain (CARDIA)    Difficulty of Paying Living Expenses: Not hard at all  Food Insecurity: No Food Insecurity (10/16/2022)   Hunger Vital Sign    Worried About Running Out of Food in the Last Year: Never true    Ran Out of Food in the Last Year: Never true  Transportation Needs: No Transportation Needs (10/16/2022)   PRAPARE - Hydrologist (Medical): No    Lack of Transportation (Non-Medical): No  Physical Activity: Inactive (10/16/2022)   Exercise Vital Sign    Days of Exercise per Week: 0 days    Minutes of Exercise per Session: 0 min  Stress: Stress Concern Present (10/16/2022)   [place]    Feeling of Stress : To some extent  Social Connections: Socially Isolated (10/16/2022)   Social Connection and Isolation Panel [NHANES]    Frequency of Communication with Friends and Family: Three times a week    Frequency of Social Gatherings with Friends and Family: Three times a week    Attends Religious Services: Never    Active Member of Clubs or Organizations: No    Attends Archivist Meetings: Never    Marital Status:  Never married    Allergies:  Allergies  Allergen Reactions   Penicillin G Other (See Comments), Rash and Nausea And Vomiting    Sore throat   Penicillins Nausea And Vomiting, Rash and Other (See Comments)    Fever Has patient had a PCN reaction causing immediate rash, facial/tongue/throat swelling, SOB or lightheadedness with hypotension: Yes Has patient had a PCN reaction causing severe rash involving mucus membranes or skin necrosis: Unknown Has patient had a PCN reaction that required hospitalization: No Has patient had a PCN reaction occurring within the last 10 years: No If all of the above answers are "NO", then may proceed with Cephalosporin use.     Objective:    Vital Signs:   Temp:  [96.5 F (35.8 C)-99 F (37.2 C)] 99 F (37.2 C) (03/13 0830) Pulse Rate:  [60-114] 60 (03/13 1232) Resp:  [13-36] 17 (03/13 1232) BP: (112-130)/(77-93) 130/81 (03/13 0308) SpO2:  [55 %-100 %] 100 % (03/13 1232) FiO2 (%):  [40 %-100 %] 40 % (03/13 1232) Last BM Date :  (PTA)  Weight change: Filed Weights   11/11/22 1211  Weight: 44.5 kg    Intake/Output:   Intake/Output Summary (Last 24 hours) at 11/15/2022 1348 Last data filed at 11/15/2022 1200 Gross per 24 hour  Intake 3036.63 ml  Output 800 ml  Net 2236.63 ml      Physical Exam    General:  intubated and sedated, thin AAF HEENT: normal + ETT + cortrak  Neck: supple. JVP not well visualized. Left IJ CVC, Carotids 2+ bilat; no bruits. No lymphadenopathy or thyromegaly appreciated. Cor: PMI nondisplaced. Regular rate & rhythm. No rubs, gallops or murmurs. Lungs: intubated and clear  Abdomen: soft, nontender, nondistended. No hepatosplenomegaly. No bruits or masses. Good bowel sounds. Extremities: no cyanosis, clubbing, rash, edema, luke warm ext  Neuro: intubated and sedated    Telemetry   A-V dual-paced  60s. PVCs but no further VT/VF  EKG    A-v dual paced 60 bpm, prolonged QTc 624 ms   Labs  Basic  Metabolic Panel: Recent Labs  Lab 11/12/22 0352 11/13/22 0215 11/14/22 0422 11/14/22 2000 11/14/22 2002 11/15/22 0103 11/15/22 0538  NA 143 143 139 139 141 137 139  K 3.4* 3.2* 4.0 3.2* 3.4* 4.5 4.6  CL 106 111 105 111  --   --  107  CO2 '25 25 23 '$ 15*  --   --  20*  GLUCOSE 180* 91 77 211*  --   --  164*  BUN 12 10 5* <5*  --   --  7  CREATININE 0.63 0.74 0.75 0.86  --   --  0.81  CALCIUM 8.4* 8.5* 9.1 8.7*  --   --  9.0  MG 2.3 2.2  --  1.7  --   --  2.7*  PHOS 2.4*  --   --  5.0*  --   --  2.0*    Liver Function Tests: Recent Labs  Lab 11/11/22 1238 11/13/22 0215 11/14/22 0422 11/14/22 2000 11/15/22 0538  AST '23 18 18 '$ 254* 445*  ALT '14 11 12 '$ 230* 226*  ALKPHOS 36* 31* 37* 61 48  BILITOT 0.5 0.3 0.3 0.2* 0.4  PROT 6.5 5.6* 6.5 5.2* 6.0*  ALBUMIN 3.2* 2.6* 3.1* 2.4* 2.7*   No results for input(s): "LIPASE", "AMYLASE" in the last 168 hours. No results for input(s): "AMMONIA" in the last 168 hours.  CBC: Recent Labs  Lab 11/11/22 1238 11/11/22 2045 11/12/22 0352 11/14/22 0023 11/14/22 2000 11/14/22 2002 11/15/22 0103 11/15/22 0538  WBC 5.9 5.3 5.1 6.6 21.0*  --   --  17.3*  NEUTROABS 4.7  --   --   --   --   --   --   --   HGB 11.8* 11.7* 10.2* 13.0 11.8* 11.9* 12.6 12.2  HCT 36.3 35.6* 30.9* 40.4 35.3* 35.0* 37.0 34.7*  MCV 101.4* 99.2 96.9 97.3 97.8  --   --  93.3  PLT 226 226 223 282 282  --   --  247    Cardiac Enzymes: No results for input(s): "CKTOTAL", "CKMB", "CKMBINDEX", "TROPONINI" in the last 168 hours.  BNP: BNP (last 3 results) Recent Labs    11/11/22 1238  BNP 58.5    ProBNP (last 3 results) No results for input(s): "PROBNP" in the last 8760 hours.   CBG: Recent Labs  Lab 11/14/22 1920 11/14/22 2325 11/15/22 0340 11/15/22 0752 11/15/22 1108  GLUCAP 142* 340* 172* 135* 142*    Coagulation Studies: Recent Labs    11/14/22 2000  LABPROT 18.4*  INR 1.5*     Imaging   ECHOCARDIOGRAM COMPLETE  Result Date:  11/15/2022    ECHOCARDIOGRAM REPORT   Patient Name:   Cynthia Underwood Date of Exam: 11/15/2022 Medical Rec #:  [id number]         Height:       63.0 in Accession #:    [id number]        Weight:       98.0 lb Date of Birth:  [birth year]        BSA:          1.428 m Patient Age:    17 years          BP:           128/82 mmHg Patient Gender: F                 HR:           61 bpm. Exam Location:  Inpatient Procedure: 2D Echo, Color Doppler and Cardiac Doppler STAT ECHO Indications:    Cardiac Arrest i46.9  History:        Patient has prior history of Echocardiogram examinations, most                 recent 01/13/2021. Pacemaker; Arrythmias:RBBB.  Sonographer:    Raquel Sarna Senior RDCS Referring Phys: [place]  Sonographer Comments: Technically difficult study due to thin body habitus, scanned supine on artificial respirator. IMPRESSIONS  1. LVEF is severely depressed with diffuse hypokinesis; inferior/inferolateral akinesis Compared to echo from 2022 this is a significant change. . Left ventricular ejection fraction, by estimation, is 20%. The left ventricle has severely decreased function.  2. Right ventricular systolic function is severely reduced. The right ventricular size is normal. There is normal pulmonary artery systolic pressure.  3. The mitral valve is normal in structure. Trivial mitral valve regurgitation.  4. The aortic valve is normal in structure. Aortic valve regurgitation is not visualized.  5. The inferior vena cava is dilated in size with <50% respiratory variability, suggesting right atrial pressure of 15 mmHg. FINDINGS  Left Ventricle: LVEF is severely depressed with diffuse hypokinesis; inferior/inferolateral akinesis Compared to echo from 2022 this is a significant change. Left ventricular ejection fraction, by estimation, is 20%. The left ventricle has severely decreased function. The left ventricular internal cavity size was normal in size. There is no left ventricular hypertrophy. Right  Ventricle: The right ventricular size is normal. Right vetricular wall thickness was not assessed. Right ventricular systolic function is severely reduced. There is normal pulmonary artery systolic pressure. The tricuspid regurgitant velocity is 1.79 m/s, and with an assumed right atrial pressure of 15 mmHg, the estimated right ventricular systolic pressure is 0000000 mmHg. Left Atrium: Left atrial size was normal in size. Right Atrium: Right atrial size was normal in size. Pericardium: There is no evidence of pericardial effusion. Mitral Valve: The mitral valve is normal in structure. Trivial mitral valve regurgitation. Tricuspid Valve: The tricuspid valve is normal in structure. Tricuspid valve regurgitation is mild. Aortic Valve: The aortic valve is normal in structure. Aortic valve regurgitation is not visualized. Pulmonic Valve: The pulmonic valve was normal in structure. Pulmonic valve regurgitation is mild. Aorta: The aortic root and ascending aorta are structurally normal, with no evidence of dilitation. Venous: The inferior vena cava is dilated in size with less than 50% respiratory variability, suggesting right atrial pressure of 15 mmHg. IAS/Shunts: No atrial level shunt detected by color flow Doppler.  LEFT VENTRICLE PLAX 2D LVIDd:         2.75 cm     Diastology LVIDs:         2.80 cm     LV e' medial:    3.48 cm/s LV PW:         0.80 cm     LV E/e' medial:  11.3 LV IVS:        0.70 cm     LV e' lateral:   4.13 cm/s LVOT diam:     1.80 cm     LV E/e' lateral: 9.5 LV SV:         25 LV SV Index:   17 LVOT Area:     2.54 cm  LV Volumes (MOD) LV vol d, MOD A2C: 53.5 ml LV vol d, MOD A4C: 58.4 ml LV vol s, MOD A2C: 36.2 ml LV vol s, MOD A4C: 38.2 ml LV SV MOD A2C:  17.3 ml LV SV MOD A4C:     58.4 ml LV SV MOD BP:      21.4 ml RIGHT VENTRICLE RV S prime:     5.98 cm/s TAPSE (M-mode): 0.8 cm LEFT ATRIUM           Index       RIGHT ATRIUM          Index LA diam:      1.40 cm 0.98 cm/m  RA Area:     7.91 cm LA  Vol (A2C): 10.1 ml 7.07 ml/m  RA Volume:   14.90 ml 10.43 ml/m LA Vol (A4C): 7.0 ml  4.90 ml/m  AORTIC VALVE LVOT Vmax:   54.30 cm/s LVOT Vmean:  36.700 cm/s LVOT VTI:    0.098 m  AORTA Ao Root diam: 2.70 cm Ao Asc diam:  2.60 cm MITRAL VALVE               TRICUSPID VALVE MV Area (PHT): 2.79 cm    TR Peak grad:   12.8 mmHg MV Decel Time: 272 msec    TR Vmax:        179.00 cm/s MV E velocity: 39.30 cm/s MV A velocity: 75.50 cm/s  SHUNTS MV E/A ratio:  0.52        Systemic VTI:  0.10 m                            Systemic Diam: 1.80 cm Dorris Carnes MD Electronically signed by Dorris Carnes MD Signature Date/Time: 11/15/2022/9:26:43 AM    Final    DG Chest Port 1 View  Result Date: 11/15/2022 CLINICAL DATA:  status post intubation. EXAM: PORTABLE CHEST 1 VIEW COMPARISON:  11/14/2022 FINDINGS: ETT tip is stable above the carina. There is a left IJ catheter with tip terminating in the superior cavoatrial junction. Dual lead left chest wall pacer noted. Heart size appears normal. No pleural effusion or edema. No airspace opacities. Signs of previous thyroidectomy. Visualized osseous structures are unremarkable. IMPRESSION: 1. Stable support apparatus. 2. No acute cardiopulmonary abnormalities. Electronically Signed   By: Kerby Moors M.D.   On: 11/15/2022 07:35   DG CHEST PORT 1 VIEW  Result Date: 11/14/2022 CLINICAL DATA:  Reason for exam: Encounter for central line placement EXAM: PORTABLE CHEST - 1 VIEW COMPARISON:  Earlier film of the same day FINDINGS: Interval placement of left IJ central venous catheter to the proximal right atrium. No pneumothorax. The endotracheal tube is been reposition, tip now 4.3 cm above carina. Gastric tube remains in the stomach. Left subclavian dual lead pacemaker stable. Lungs clear. Heart size and mediastinal contours are within normal limits. No effusion. Visualized bones unremarkable. Surgical clips at the thoracic inlet. IMPRESSION: Left IJ central venous catheter to the  proximal right atrium. No pneumothorax. Electronically Signed   By: Lucrezia Europe M.D.   On: 11/14/2022 21:30   DG CHEST PORT 1 VIEW  Addendum Date: 11/14/2022   ADDENDUM REPORT: 11/14/2022 20:01 ADDENDUM: These results were called by telephone at the time of interpretation on 11/14/2022 at 8:01 pm to provider Nevada Crane, physician assistant, who verbally acknowledged these results. Electronically Signed   By: Ronney Asters M.D.   On: 11/14/2022 20:01   Result Date: 11/14/2022 CLINICAL DATA:  Cardiac arrest EXAM: PORTABLE CHEST 1 VIEW COMPARISON:  Chest x-ray 11/14/2022 FINDINGS: There is right mainstem intubation. Enteric tube extends into the mid stomach. Left-sided pacemaker is again seen.  The cardiomediastinal silhouette is within normal limits. There central pulmonary vascular congestion. There is no pleural effusion or pneumothorax visualized on this supine view. No acute fractures are identified. IMPRESSION: 1. Right mainstem intubation.  Recommend retraction proximally 5 cm. 2. Central pulmonary vascular congestion. 3. Enteric tube extends into the mid stomach. Electronically Signed: By: Ronney Asters M.D. On: 11/14/2022 19:50   DG Abd 1 View  Result Date: 11/14/2022 CLINICAL DATA:  Enteric catheter placement EXAM: ABDOMEN - 1 VIEW COMPARISON:  None Available. FINDINGS: Frontal view of the lower chest and upper abdomen demonstrates enteric catheter passing below diaphragm tip and side port projecting over the gastric body. External defibrillator pads overlie the lower chest. Stable position of the dual lead pacemaker. Patchy consolidation at the left lung base. Moderate gaseous distention of the small bowel measuring up to 2.6 cm in diameter. IMPRESSION: 1. Enteric catheter tip and side port projecting over the gastric body. 2. Gaseous distension of the proximal small bowel, incompletely imaged on this exam. This could reflect distal small bowel obstruction or ileus. 3. Patchy left basilar  airspace disease. Electronically Signed   By: Randa Ngo M.D.   On: 11/14/2022 19:52     Medications:     Current Medications:  [START ON 11/16/2022] aspirin  81 mg Per Tube Daily   Chlorhexidine Gluconate Cloth  6 each Topical Daily   insulin aspart  0-9 Units Subcutaneous Q4H   levothyroxine  112 mcg Per Tube QAC breakfast   methylPREDNISolone (SOLU-MEDROL) injection  40 mg Intravenous Daily   mouth rinse  15 mL Mouth Rinse Q2H   pantoprazole (PROTONIX) IV  40 mg Intravenous QHS   sucralfate  1 g Per Tube BID    Infusions:  cefTRIAXone (ROCEPHIN)  IV Stopped (11/14/22 2352)   dexmedetomidine (PRECEDEX) IV infusion 0.5 mcg/kg/hr (11/15/22 1200)   fentaNYL infusion INTRAVENOUS 75 mcg/hr (11/15/22 1200)   heparin 550 Units/hr (11/15/22 1200)   lactated ringers 50 mL/hr at 11/15/22 1200   norepinephrine (LEVOPHED) Adult infusion 2 mcg/min (11/15/22 1200)   sodium phosphate 15 mmol in dextrose 5 % 250 mL infusion 43 mL/hr at 11/15/22 1200      Patient Profile   52 y.o. 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 Hospital course c/b VF arrest. Post arrest echo w/ severe BiV failure, LVEF < 20%, RV severely reduced + diffuse HK.   Assessment/Plan   1. Acute Systolic Heart Failure - New diagnosis  - cMRI 2017 LVEF 56%, RVEF 56% + myocardial thinning and subendocardial LGE in the mid inferior and ineferolateral walls>>PET negative for sarcoid  - Echo 2022 EF 60-65%, RV normal - Echo post VF arrest EF < 20%, RV severely reduced w/ diffuse HK  - Hs trop 318>>1,359>>pending  - Biventricular failure in setting of acute metapneumovirus, VF arrest + troponin elevation  concerning for possible viral myocarditis  - CM may also be 2/2 myotonic dystrophy - will need [facility] tomorrow. If no evidence of significant coronary disease will need cMRI  - no GDMT for now given soft BP requiring NE support (currently on 2 mcg)  - Volume status ok, CVP 6 - check co-ox  - has PPM for CHB 2/2  myotonic dystrophy. Will need LifeVest at D/c and ultimate upgrade to ICD if EF does not improve in 3 months    2. In Hospital VF Arrest  - immediate ACLs w/ ROSC ~15 min  - currently sedated but RN reports pt was following commands earlier w/ sedation wean  - no further high grade arrhythmias on tele. Will avoid amio for now w/ prolonged QT (624 ms on EKG)  - keep K > 4.0 and Mg > 2.0  - plan cath tomorrow to r/o CAD  3. Acute Hypoxic Respiratory Failure - LLL CAP secondary to metapneumovirus - intubated during VF arrest  - vent management, abx and steroids per PCCM    Length of Stay: 1  Brittainy Simmons, PA-C  11/15/2022, 1:48 PM  Advanced Heart Failure Team Pager [phone] (M-F; 7a - 5p)  Please contact [facility] for night-coverage after hours (4p -7a ) and weekends on [email or web address]  Patient seen with PA, agree with the above note.   Patient has history of myotonic dystrophy and high grade heart block with [employer] PPM with his bundle lead.  Echo in 5/22 showed normal EF.  She had a workup for cardiac sarcoidosis in the past, FDG-PET was not suggestive of inflammation.  Cardiac MRI in 10/17 showed subendocardial LGE in the mid inferior and inferolateral walls with EF 56%.   She was admitted with dyspnea, LLL PNA and found to have metapneumovirus infection. Yesterday evening, she had VF arrest and was shocked back to NSR.  She was intubated post-arrest.   Echo was done, showing EF 20-25% range with diffuse hypokinesis but relatively preserved function in the apex, moderate RV dysfunction.   She is currently intubated, on NE 1.  She is not on amiodarone, QTc is  prolonged on ECG.   General: Intubated, sedated.  Neck: No JVD, no thyromegaly or thyroid nodule.  Lungs: Clear to auscultation bilaterally with normal respiratory effort. CV: Nondisplaced PMI.  Heart regular S1/S2, no S3/S4, no murmur.  No peripheral edema.  Abdomen: Soft, nontender, no hepatosplenomegaly, no distention.  Skin: Intact without lesions or rashes.  Neurologic: Sedated.  Extremities: No clubbing or cyanosis.  HEENT: Normal.   1. Acute hypoxemic respiratory failure: Intubated post-VF arrest.  Had viral PNA with metapneumovirus at presentation.  - Per CCM.  Plan currently is to keep intubated until after cath.  - On Solumedrol for bronchospasm.  2. High grade heart block: Has MDT PPM with His bundle lead.  This is likely due to cardiac involvement of myotonic dystrophy type 1.  3. VF arrest: 3/12, shocked. 15 minutes CPR total.  - Low threshold to restart amiodarone if further ectopy.  - Plan right/left cath tomorrow (see below), will need to eventually involve EP if cath negative for secondary prevention ICD upgrade.  4. Acute systolic CHF: Prior workup for cardiac sarcoidosis. Cardiac MRI in 2017 showed subendocardial LGE in the mid inferior and inferolateral walls with EF 56%.  Cardiac PET done after this was NOT suggestive of cardiac sarcoidosis.   Echo in 5/22 with normal EF.  Echo this admission reviewed, EF 20-25% range with diffuse hypokinesis but relatively preserved function at the apex, moderate RV dysfunction.  Cause of cardiomyopathy is uncertain.  She is a smoker and noted to be hyperglycemic this admission.  There is also some regionality to her wall motion abnormalities on echo though not clear it is in a CAD pattern.  Cannot rule out ischemic cardiomyopathy.  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  She has an aunt  with LVAD who has cardiomyopathy due to myotonic dystrophy.  Father had a heart transplant, uncertain of underlying condition.  She is on NE 1 currently, can likely be titrated off.  - LHC/RHC tomorrow.  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  Discussed risks/benefits of cath with daughter and mother (patient intubated), they agree to procedure.  - If no significant coronary disease, would eventually repeat cardiac MRI.  LGE pattern is often mid-myocardial inferolateral wall with myotonic dystrophy dilated cardiomyopathy.  - She has MDT PPM with his bundle lead but paced QRS is very wide.  If no revascularizable coronary disease, would recommend upgrade of device to CRT-D (need more narrow paced QRS than we are getting with her his pacing).  5. CAD: HS-TnI peaked at 1359 in setting of VF arrest then trended down.  No prior chest pain.   - As above, needs coronary angiography to rule out CAD.  6. Elevated LFTs: Trending down, suspect shock liver/hypoperfusion in setting of VF arrest.  7. Smoker: She will need to quit.  8. Sickle cell trait 9. Hyperglycemia: Patient does not carry the diagnosis of diabetes but blood glucose has been high in the hospital, using insulin.   - Check hgbA1c.  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 Has family members with the same diagnosis.  Followed by neurology.   Loralie Champagne 11/15/2022 6:04 PM

## 2022-11-15 NOTE — Progress Notes (Signed)
[facility] Progress Note Patient Name: Cynthia Underwood DOB: 1970-09-11 MRN: [id number]   Date of Service  11/15/2022  HPI/Events of Note  Patient currently a RAS of -2 to -3.  eICU Interventions  Continue to monitor closely for now without change to sedation regimen.        Kerry Kass Cynthia Underwood 11/15/2022, 4:25 AM

## 2022-11-15 NOTE — Procedures (Signed)
Cortrak  Tube Type:  Cortrak - 43 inches Tube Location:  Left nare Secured by: Bridle Technique Used to Measure Tube Placement:  Marking at nare/corner of mouth Cortrak Secured At:  65 cm   Cortrak Tube Team Note:  Consult received to place a Cortrak feeding tube.   X-ray is required, abdominal x-ray has been ordered by the Cortrak team. Please confirm tube placement before using the Cortrak tube.   If the tube becomes dislodged please keep the tube and contact the Cortrak team at [email or web address] for replacement.  If after hours and replacement cannot be delayed, place a NG tube and confirm placement with an abdominal x-ray.    Darci Lykins MS, RD, LDN Please refer to AMION for RD and/or RD on-call/weekend/after hours pager   

## 2022-11-15 NOTE — TOC Progression Note (Signed)
Transition of Care [facility]) - Progression Note    Patient Details  Name: Cynthia Underwood MRN: [id number] Date of Birth: 08-May-1971  Transition of [facility]) CM/SW Contact  Cyndi Bender, RN Phone Number: 11/15/2022, 3:07 PM  Clinical Narrative:     Initially admitted 3/9 for acute hypoxic respiratory failure. She received ABX for LLL CAP secondary to metapneumovirus.   he was doing fairly well until yesterday evening. She became unresponsive and had Vfib arrest, required ~15 min of CPR + defibrillation, epi and amiodarone w/ ROSC.  Patient currently intubated.  TOC following.   Expected Discharge Plan: Home/Self Care    Expected Discharge Plan and Services                                               Social Determinants of Health (SDOH) Interventions SDOH Screenings   Food Insecurity: No Food Insecurity (10/16/2022)  Housing: Low Risk  (10/16/2022)  Transportation Needs: No Transportation Needs (10/16/2022)  Utilities: Not At Risk (10/16/2022)  Alcohol Screen: Low Risk  (10/16/2022)  Depression (PHQ2-9): Medium Risk (11/08/2022)  Financial Resource Strain: Low Risk  (10/16/2022)  Physical Activity: Inactive (10/16/2022)  Social Connections: Socially Isolated (10/16/2022)  Stress: Stress Concern Present (10/16/2022)  Tobacco Use: High Risk (11/11/2022)    Readmission Risk Interventions     No data to display

## 2022-11-15 NOTE — Progress Notes (Signed)
Rounding Note    Patient Name: Cynthia Underwood Date of Encounter: 11/15/2022  [place] Cardiologist: Fransico Him, MD   Subjective   Pt intubated   Sedated   Does waken some on examination   Inpatient Medications    Scheduled Meds:  [START ON 11/16/2022] aspirin  81 mg Per Tube Daily   azithromycin  250 mg Per Tube Daily   Chlorhexidine Gluconate Cloth  6 each Topical Daily   docusate  100 mg Per Tube BID   hyaluronidase Human  150 Units Subcutaneous Once   insulin aspart  0-9 Units Subcutaneous Q4H   levothyroxine  112 mcg Per Tube QAC breakfast   methylPREDNISolone (SOLU-MEDROL) injection  40 mg Intravenous Daily   mouth rinse  15 mL Mouth Rinse Q2H   pantoprazole (PROTONIX) IV  40 mg Intravenous QHS   polyethylene glycol  17 g Per Tube Daily   sucralfate  1 g Per Tube BID   Continuous Infusions:  cefTRIAXone (ROCEPHIN)  IV Stopped (11/14/22 2352)   dexmedetomidine (PRECEDEX) IV infusion 0.6 mcg/kg/hr (11/15/22 0617)   fentaNYL infusion INTRAVENOUS 200 mcg/hr (11/15/22 0600)   heparin 550 Units/hr (11/15/22 0600)   lactated ringers Stopped (11/14/22 1840)   norepinephrine (LEVOPHED) Adult infusion Stopped (11/15/22 0539)   sodium phosphate 15 mmol in dextrose 5 % 250 mL infusion     PRN Meds: acetaminophen, fentaNYL, guaiFENesin, ipratropium-albuterol, melatonin, midazolam, ondansetron (ZOFRAN) IV, mouth rinse   Vital Signs    Vitals:   11/15/22 0600 11/15/22 0615 11/15/22 0617 11/15/22 0840  BP:      Pulse: 60 60 60 60  Resp: '16 16 16 16  '$ Temp:      TempSrc:      SpO2: 99% 99% 99% 99%  Weight:      Height:        Intake/Output Summary (Last 24 hours) at 11/15/2022 0951 Last data filed at 11/15/2022 0617 Gross per 24 hour  Intake 3068.9 ml  Output 300 ml  Net 2768.9 ml      11/11/2022   12:11 PM 11/08/2022    2:22 PM 10/31/2022    3:02 PM  Last 3 Weights  Weight (lbs) 98 lb 98 lb 9.6 oz 103 lb  Weight (kg) 44.453 kg 44.725 kg 46.72 kg       Telemetry    SR  Paced, PVCs - Personally Reviewed  ECG    3/9  SR  Paced    - Personally Reviewed  Physical Exam   GEN: Thin 52 yo intubated, sedated  Neck: JVP is normal  Cardiac: RRR, no murmurs, Respiratory: CTA anteriorly  GI: Soft, nontender, non-distended  MS: No edema;   Labs    High Sensitivity Troponin:   Recent Labs  Lab 11/14/22 2000 11/14/22 2318  TROPONINIHS 318* 1,359*     Chemistry Recent Labs  Lab 11/13/22 0215 11/14/22 0422 11/14/22 2000 11/14/22 2002 11/15/22 0103 11/15/22 0538  NA 143 139 139 141 137 139  K 3.2* 4.0 3.2* 3.4* 4.5 4.6  CL 111 105 111  --   --  107  CO2 25 23 15*  --   --  20*  GLUCOSE 91 77 211*  --   --  164*  BUN 10 5* <5*  --   --  7  CREATININE 0.74 0.75 0.86  --   --  0.81  CALCIUM 8.5* 9.1 8.7*  --   --  9.0  MG 2.2  --  1.7  --   --  2.7*  PROT 5.6* 6.5 5.2*  --   --  6.0*  ALBUMIN 2.6* 3.1* 2.4*  --   --  2.7*  AST 18 18 254*  --   --  445*  ALT 11 12 230*  --   --  226*  ALKPHOS 31* 37* 61  --   --  48  BILITOT 0.3 0.3 0.2*  --   --  0.4  GFRNONAA >60 >60 >60  --   --  >60  ANIONGAP '7 11 13  '$ --   --  12    Lipids No results for input(s): "CHOL", "TRIG", "HDL", "LABVLDL", "LDLCALC", "CHOLHDL" in the last 168 hours.  Hematology Recent Labs  Lab 11/14/22 0023 11/14/22 2000 11/14/22 2002 11/15/22 0103 11/15/22 0538  WBC 6.6 21.0*  --   --  17.3*  RBC 4.15 3.61*  --   --  3.72*  HGB 13.0 11.8* 11.9* 12.6 12.2  HCT 40.4 35.3* 35.0* 37.0 34.7*  MCV 97.3 97.8  --   --  93.3  MCH 31.3 32.7  --   --  32.8  MCHC 32.2 33.4  --   --  35.2  RDW 15.2 15.0  --   --  14.8  PLT 282 282  --   --  247   Thyroid  Recent Labs  Lab 11/11/22 2045  TSH 1.978    BNP Recent Labs  Lab 11/11/22 1238  BNP 58.5    DDimer No results for input(s): "DDIMER" in the last 168 hours.   Radiology    ECHOCARDIOGRAM COMPLETE  Result Date: 11/15/2022    ECHOCARDIOGRAM REPORT   Patient Name:   Cynthia Underwood Date  of Exam: 11/15/2022 Medical Rec #:  [id number]         Height:       63.0 in Accession #:    [id number]        Weight:       98.0 lb Date of Birth:  Jan 17, 1971        BSA:          1.428 m Patient Age:    52 years          BP:           128/82 mmHg Patient Gender: F                 HR:           61 bpm. Exam Location:  Inpatient Procedure: 2D Echo, Color Doppler and Cardiac Doppler STAT ECHO Indications:    Cardiac Arrest i46.9  History:        Patient has prior history of Echocardiogram examinations, most                 recent 01/13/2021. Pacemaker; Arrythmias:RBBB.  Sonographer:    Raquel Sarna Senior RDCS Referring Phys: [place]  Sonographer Comments: Technically difficult study due to thin body habitus, scanned supine on artificial respirator. IMPRESSIONS  1. LVEF is severely depressed with diffuse hypokinesis; inferior/inferolateral akinesis Compared to echo from 2022 this is a significant change. . Left ventricular ejection fraction, by estimation, is 20%. The left ventricle has severely decreased function.  2. Right ventricular systolic function is severely reduced. The right ventricular size is normal. There is normal pulmonary artery systolic pressure.  3. The mitral valve is normal in structure. Trivial mitral valve regurgitation.  4. The aortic valve is normal in structure. Aortic valve regurgitation is not visualized.  5. The inferior vena cava is dilated in size with <50% respiratory variability, suggesting right atrial pressure of 15 mmHg. FINDINGS  Left Ventricle: LVEF is severely depressed with diffuse hypokinesis; inferior/inferolateral akinesis Compared to echo from 2022 this is a significant change. Left ventricular ejection fraction, by estimation, is 20%. The left ventricle has severely decreased function. The left ventricular internal cavity size was normal in size. There is no left ventricular hypertrophy. Right Ventricle: The right ventricular size is normal. Right vetricular wall  thickness was not assessed. Right ventricular systolic function is severely reduced. There is normal pulmonary artery systolic pressure. The tricuspid regurgitant velocity is 1.79 m/s, and with an assumed right atrial pressure of 15 mmHg, the estimated right ventricular systolic pressure is 0000000 mmHg. Left Atrium: Left atrial size was normal in size. Right Atrium: Right atrial size was normal in size. Pericardium: There is no evidence of pericardial effusion. Mitral Valve: The mitral valve is normal in structure. Trivial mitral valve regurgitation. Tricuspid Valve: The tricuspid valve is normal in structure. Tricuspid valve regurgitation is mild. Aortic Valve: The aortic valve is normal in structure. Aortic valve regurgitation is not visualized. Pulmonic Valve: The pulmonic valve was normal in structure. Pulmonic valve regurgitation is mild. Aorta: The aortic root and ascending aorta are structurally normal, with no evidence of dilitation. Venous: The inferior vena cava is dilated in size with less than 50% respiratory variability, suggesting right atrial pressure of 15 mmHg. IAS/Shunts: No atrial level shunt detected by color flow Doppler.  LEFT VENTRICLE PLAX 2D LVIDd:         2.75 cm     Diastology LVIDs:         2.80 cm     LV e' medial:    3.48 cm/s LV PW:         0.80 cm     LV E/e' medial:  11.3 LV IVS:        0.70 cm     LV e' lateral:   4.13 cm/s LVOT diam:     1.80 cm     LV E/e' lateral: 9.5 LV SV:         25 LV SV Index:   17 LVOT Area:     2.54 cm  LV Volumes (MOD) LV vol d, MOD A2C: 53.5 ml LV vol d, MOD A4C: 58.4 ml LV vol s, MOD A2C: 36.2 ml LV vol s, MOD A4C: 38.2 ml LV SV MOD A2C:     17.3 ml LV SV MOD A4C:     58.4 ml LV SV MOD BP:      21.4 ml RIGHT VENTRICLE RV S prime:     5.98 cm/s TAPSE (M-mode): 0.8 cm LEFT ATRIUM           Index       RIGHT ATRIUM          Index LA diam:      1.40 cm 0.98 cm/m  RA Area:     7.91 cm LA Vol (A2C): 10.1 ml 7.07 ml/m  RA Volume:   14.90 ml 10.43 ml/m LA  Vol (A4C): 7.0 ml  4.90 ml/m  AORTIC VALVE LVOT Vmax:   54.30 cm/s LVOT Vmean:  36.700 cm/s LVOT VTI:    0.098 m  AORTA Ao Root diam: 2.70 cm Ao Asc diam:  2.60 cm MITRAL VALVE               TRICUSPID VALVE MV Area (PHT): 2.79 cm  TR Peak grad:   12.8 mmHg MV Decel Time: 272 msec    TR Vmax:        179.00 cm/s MV E velocity: 39.30 cm/s MV A velocity: 75.50 cm/s  SHUNTS MV E/A ratio:  0.52        Systemic VTI:  0.10 m                            Systemic Diam: 1.80 cm Dorris Carnes MD Electronically signed by Dorris Carnes MD Signature Date/Time: 11/15/2022/9:26:43 AM    Final    DG Chest Port 1 View  Result Date: 11/15/2022 CLINICAL DATA:  status post intubation. EXAM: PORTABLE CHEST 1 VIEW COMPARISON:  11/14/2022 FINDINGS: ETT tip is stable above the carina. There is a left IJ catheter with tip terminating in the superior cavoatrial junction. Dual lead left chest wall pacer noted. Heart size appears normal. No pleural effusion or edema. No airspace opacities. Signs of previous thyroidectomy. Visualized osseous structures are unremarkable. IMPRESSION: 1. Stable support apparatus. 2. No acute cardiopulmonary abnormalities. Electronically Signed   By: Kerby Moors M.D.   On: 11/15/2022 07:35   DG CHEST PORT 1 VIEW  Result Date: 11/14/2022 CLINICAL DATA:  Reason for exam: Encounter for central line placement EXAM: PORTABLE CHEST - 1 VIEW COMPARISON:  Earlier film of the same day FINDINGS: Interval placement of left IJ central venous catheter to the proximal right atrium. No pneumothorax. The endotracheal tube is been reposition, tip now 4.3 cm above carina. Gastric tube remains in the stomach. Left subclavian dual lead pacemaker stable. Lungs clear. Heart size and mediastinal contours are within normal limits. No effusion. Visualized bones unremarkable. Surgical clips at the thoracic inlet. IMPRESSION: Left IJ central venous catheter to the proximal right atrium. No pneumothorax. Electronically Signed   By: Lucrezia Europe M.D.   On: 11/14/2022 21:30   DG CHEST PORT 1 VIEW  Addendum Date: 11/14/2022   ADDENDUM REPORT: 11/14/2022 20:01 ADDENDUM: These results were called by telephone at the time of interpretation on 11/14/2022 at 8:01 pm to provider [place] Crane, physician assistant, who verbally acknowledged these results. Electronically Signed   By: Ronney Asters M.D.   On: 11/14/2022 20:01   Result Date: 11/14/2022 CLINICAL DATA:  Cardiac arrest EXAM: PORTABLE CHEST 1 VIEW COMPARISON:  Chest x-ray 11/14/2022 FINDINGS: There is right mainstem intubation. Enteric tube extends into the mid stomach. Left-sided pacemaker is again seen. The cardiomediastinal silhouette is within normal limits. There central pulmonary vascular congestion. There is no pleural effusion or pneumothorax visualized on this supine view. No acute fractures are identified. IMPRESSION: 1. Right mainstem intubation.  Recommend retraction proximally 5 cm. 2. Central pulmonary vascular congestion. 3. Enteric tube extends into the mid stomach. Electronically Signed: By: Ronney Asters M.D. On: 11/14/2022 19:50   DG Abd 1 View  Result Date: 11/14/2022 CLINICAL DATA:  Enteric catheter placement EXAM: ABDOMEN - 1 VIEW COMPARISON:  None Available. FINDINGS: Frontal view of the lower chest and upper abdomen demonstrates enteric catheter passing below diaphragm tip and side port projecting over the gastric body. External defibrillator pads overlie the lower chest. Stable position of the dual lead pacemaker. Patchy consolidation at the left lung base. Moderate gaseous distention of the small bowel measuring up to 2.6 cm in diameter. IMPRESSION: 1. Enteric catheter tip and side port projecting over the gastric body. 2. Gaseous distension of the proximal small bowel, incompletely imaged on this exam.  This could reflect distal small bowel obstruction or ileus. 3. Patchy left basilar airspace disease. Electronically Signed   By: Randa Ngo M.D.   On:  11/14/2022 19:52   DG CHEST PORT 1 VIEW  Result Date: 11/14/2022 CLINICAL DATA:  52 year old female presents for evaluation of pneumonia. EXAM: PORTABLE CHEST 1 VIEW COMPARISON:  November 11, 2022 FINDINGS: LEFT-sided dual lead pacer device remains in place. EKG leads project over the chest. Postoperative changes noted in the low neck. Cardiomediastinal contours and hilar structures are normal. No lobar consolidative process. No pneumothorax. No gross effusion. Subtle airspace process along the LEFT heart border. On limited assessment no acute skeletal process. IMPRESSION: Lingular or upper lobe process is quite subtle and could reflect atelectasis, difficult to exclude early infection. Electronically Signed   By: Zetta Bills M.D.   On: 11/14/2022 13:45    Cardiac Studies   Echo   11/15/22   1. LVEF is severely depressed with diffuse hypokinesis;  inferior/inferolateral akinesis Compared to echo from 2022 this is a  significant change. . Left ventricular ejection fraction, by estimation,  is 20%. The left ventricle has severely decreased  function.   2. Right ventricular systolic function is severely reduced. The right  ventricular size is normal. There is normal pulmonary artery systolic  pressure.   3. The mitral valve is normal in structure. Trivial mitral valve  regurgitation.   4. The aortic valve is normal in structure. Aortic valve regurgitation is  not visualized.   5. The inferior vena cava is dilated in size with <50% respiratory  variability, suggesting right atrial pressure of 15 mmHg.   Patient Profile     52 y.o. female with hx of myotonic dystrophy type I (s/p dual chamber pacer,), who is hospitalized s/p VF arrest.     Assessment & Plan    1  VT/VF   [employer] rep has interrogated pts pacemaker   Episodes of NSVT starting last night at around 6:30 PM   Then at 6:44 had more  extended.  Some undersensing occurred    K was 3.2 at time   Hypoxic yesrday afternoon   She has  not had any recurrent VT/VF   She is not on amiodarone   If has any significant ectopy then would resume this     Keep on tele EP will need to evaluate patient  at some point    WOuld start with low dose b blocker  when off of pressors Taper as BP tolerates   2  HFrEF  Echo today shows severe LV dysfunction, RV dysfunction   There are some regional wall motion discrepencies with worse hypokinesis in the inferior, inferolateral walls   This is all new from echo in 2022    I did not question pt (since she is intubated) to eval when SOB started   ? If longer than current viral illness      Etiology   ? Viral    ? Due to myotonic dystrophy   ? Ischemic (doubt, but given age and regional wall motion changes need to rule out)  REcomm:   WOuld recomm R/L heart cath to r/o CAD, evaluate filling pressures  I have also presented case to D Aundra Dubin   Pt's family members follow in the CHF clinic.  The CHF team will see the pt       3  Elevated troponin   May reflect demand in setting of hypoxia, low perfusion   Follow  Plan for LHC as noted above   4 S/p PPM    High degree AV block   Pt follows with [facility] P[facility]   Pacer placed in 2018    5  Pulmonary   Pt intubated   On empiric ABXin  Resp collection + for metapneumovirus  On epiric ABX     6.  Elevated liver enzymes   Pt's LFTs normal yesterday  mornin   They are elevated now  WOuld follow for trends  May reflect poor perfusion       For questions or updates, please contact [place] Please consult [email or web address] for contact info under        Signed, Dorris Carnes, MD  11/15/2022, 9:51 AM

## 2022-11-15 NOTE — Progress Notes (Signed)
NAME:  Cynthia Underwood, MRN:  [id number], DOB:  06-08-1971, LOS: 1 ADMISSION DATE:  11/11/2022 CONSULTATION DATE:  11/14/2022 REFERRING MD:  Wyline Copas - [facility] CHIEF COMPLAINT:  Vfib arrest on floor   History of Present Illness:  52 year old woman who initially presented to [facility] 3/9 for cough and progressive dyspnea x 4 days. Also reported diarrhea and 1 episode of epigastric pain with vomiting. PMHx significant for RBBB/high-grade heart block (s/p PPM), hypothyroidism, sickle cell trait, myotonic dystrophy (dx age 2), tobacco use.  Noted to be hypoxic in ED with O2 sat 88% on RA, CTA Chest c/f LLL PNA with mild bronchiectasis, negative for PE. Also c/f esophagitis. Patient was empirically started on CAP coverage (ceftriaxone, azithromycin) and GERD treatment (PPI/sucralfate). Also found to be positive for human metapneumovirus. Admitted to [facility]  Patient was initially progressing as expected 3/11-3/12 until ~1400, sat was noted to be in 80s. Placed on 5L humidified oxygen with slight improvement in sats. Solumedrol and DuoNebs were ordered. Around 1900 patient's RN was informed by telemetry of VF and was found unresponsive and pulseless. Code Blue was called with CPR x 15 minutes and ROSC. Amiodarone, Epi and defib were administered. Patient was intubated by anesthesia peri-code. IO was placed and Levophed was initiated. Cardiology was consulted.  PCCM was consulted for ICU admission post-arrest.  Pertinent Medical History:   Past Medical History:  Diagnosis Date   Bifascicular block 10/02/2014   Cataract    Dyspnea    Excess ear wax    Multinodular goiter    Pneumonia    Presence of permanent cardiac pacemaker    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   Sickle cell trait (HCC)    Watery eyes    Left   Weakness of both legs    Significant Hospital Events: Including procedures, antibiotic start and stop dates in addition to other pertinent events    3/9 - Presented to [facility] for LLL PNA. Started on Surveyor, quantity. +human metapneumovirus. 3/12 - Hypoxic requiring 5LNC. Later VF arrest on floor, CPR x   Interim History / Subjective:   Fentanyl 200, Precedex 0.6 Norepinephrine weaned to 2 0.40, PEEP 5 I/O+ 4.1 L total  Objective:  Blood pressure 130/81, pulse 60, temperature 97.8 F (36.6 C), temperature source Oral, resp. rate 16, height '5\' 3"'$  (1.6 m), weight 44.5 kg, last menstrual period 12/03/2016, SpO2 99 %.    Vent Mode: PRVC FiO2 (%):  [40 %-100 %] 40 % Set Rate:  [16 bmp] 16 bmp Vt Set:  [420 mL] 420 mL PEEP:  [5 cmH20-10 cmH20] 5 cmH20 Plateau Pressure:  [16 cmH20-17 cmH20] 17 cmH20   Intake/Output Summary (Last 24 hours) at 11/15/2022 0725 Last data filed at 11/15/2022 [id number] Gross per 24 hour  Intake 3068.9 ml  Output 300 ml  Net 2768.9 ml   Filed Weights   11/11/22 1211  Weight: 44.5 kg   Physical Examination: General: Critically ill-appearing woman, ventilated and sedated  HEENT: ET tube in place, pupils equal Neuro: Nodding to questions, try to communicate.  Purposeful movement, not following commands currently CV: Distant, regular, no murmur PULM: Clear bilaterally GI: Nondistended, positive bowel sounds Extremities: No edema Skin: No rash  Resolved Hospital Problem List:    Assessment & Plan:  Undifferentiated shock, ?septic vs. cardiogenic post-arrest -Wean norepinephrine as able, MAP goal 65 -Transduce CVP and give volume resuscitation for goal 10-12 -Follow lactic acid for clearance -Continue antibiotics for presumed CAP as below -Cardiology  evaluation as below  Post-Vfib arrest Troponinemia History of RBBB and PPM implantation -Normothermia protocol postarrest -Appreciate cardiology evaluation -Continue heparin infusion for now, possible ACS.  Following troponin -Optimize potassium, magnesium -Repeat echocardiogram pending  Acute hypoxemic respiratory failure LLL PNA +Human  metapneumovirus infection -PRVC 8 cc/kg, move to SBT as soon as feasible based on respiratory mechanics, mental status -Pulmonary hygiene -Sedation as per PAD protocol, fentanyl and Precedex -Continue ceftriaxone azithromycin -Question role for steroids here, consider DC soon -Follow chest x-ray  Acute metabolic encephalopathy Multifactorial in the setting of post-arrest, metabolic derangements/acidosis, hypoxia, sedation. -CT head was deferred overnight, consider reordering if there is a decline in wakefulness, mental status -Work to correct underlying metabolic derangements, treat underlying infection -PAD protocol as above, minimize sedation and plan for daily wake-up  Shock liver Likely in the setting of VF arrest with transaminitis post-code. -Following LFT, coags  Hyperglycemia No history of DM per family. -Sliding scale insulin and scheduled CBG -Goal CBG <180  GERD with esophagitis -PPI as ordered  Hypothyroidism -levothyroxine as ordered  Best Practice: (right click and "Reselect all SmartList Selections" daily)   Diet/type: NPO w/ meds via tube DVT prophylaxis: prophylactic heparin , SCDs GI prophylaxis: PPI Lines: Central line and Arterial Line Foley:  Yes, and it is still needed Code Status:  full code Last date of multidisciplinary goals of care discussion [Pending - family updated at bedside 3/12PM, continue to desire full code/full scope of care]  Labs:  CBC: Recent Labs  Lab 11/11/22 1238 11/11/22 2045 11/12/22 0352 11/14/22 0023 11/14/22 2000 11/14/22 2002 11/15/22 0103 11/15/22 0538  WBC 5.9 5.3 5.1 6.6 21.0*  --   --  17.3*  NEUTROABS 4.7  --   --   --   --   --   --   --   HGB 11.8* 11.7* 10.2* 13.0 11.8* 11.9* 12.6 12.2  HCT 36.3 35.6* 30.9* 40.4 35.3* 35.0* 37.0 34.7*  MCV 101.4* 99.2 96.9 97.3 97.8  --   --  93.3  PLT 226 226 223 282 282  --   --  [id number]   Basic Metabolic Panel: Recent Labs  Lab 11/12/22 0352 11/13/22 0215  11/14/22 0422 11/14/22 2000 11/14/22 2002 11/15/22 0103 11/15/22 0538  NA 143 143 139 139 141 137 139  K 3.4* 3.2* 4.0 3.2* 3.4* 4.5 4.6  CL 106 111 105 111  --   --  107  CO2 '25 25 23 '$ 15*  --   --  20*  GLUCOSE 180* 91 77 211*  --   --  164*  BUN 12 10 5* <5*  --   --  7  CREATININE 0.63 0.74 0.75 0.86  --   --  0.81  CALCIUM 8.4* 8.5* 9.1 8.7*  --   --  9.0  MG 2.3 2.2  --  1.7  --   --  2.7*  PHOS 2.4*  --   --  5.0*  --   --  2.0*   GFR: Estimated Creatinine Clearance: 56.4 mL/min (by C-G formula based on SCr of 0.81 mg/dL). Recent Labs  Lab 11/12/22 0352 11/14/22 0023 11/14/22 2000 11/14/22 2318 11/15/22 0538  PROCALCITON <0.10  --   --   --   --   WBC 5.1 6.6 21.0*  --  17.3*  LATICACIDVEN  --   --   --  3.0* 1.3   Liver Function Tests: Recent Labs  Lab 11/11/22 1238 11/13/22 0215 11/14/22 0422 11/14/22 2000 11/15/22 [id number]  AST '23 18 18 '$ 254* 445*  ALT '14 11 12 '$ 230* 226*  ALKPHOS 36* 31* 37* 61 48  BILITOT 0.5 0.3 0.3 0.2* 0.4  PROT 6.5 5.6* 6.5 5.2* 6.0*  ALBUMIN 3.2* 2.6* 3.1* 2.4* 2.7*   No results for input(s): "LIPASE", "AMYLASE" in the last 168 hours. No results for input(s): "AMMONIA" in the last 168 hours.  ABG:    Component Value Date/Time   PHART 7.332 (L) 11/15/2022 0103   PCO2ART 35.9 11/15/2022 0103   PO2ART 424 (H) 11/15/2022 0103   HCO3 19.3 (L) 11/15/2022 0103   TCO2 20 (L) 11/15/2022 0103   ACIDBASEDEF 6.0 (H) 11/15/2022 0103   O2SAT 100 11/15/2022 0103    Coagulation Profile: Recent Labs  Lab 11/14/22 2000  INR 1.5*    Cardiac Enzymes: No results for input(s): "CKTOTAL", "CKMB", "CKMBINDEX", "TROPONINI" in the last 168 hours.  HbA1C: Hgb A1c MFr Bld  Date/Time Value Ref Range Status  01/09/2017 08:32 AM 5.7 4.6 - 6.5 % Final    Comment:    Glycemic Control Guidelines for People with Diabetes:Non Diabetic:  <6%Goal of Therapy: <7%Additional Action Suggested:  >8%   07/09/2015 12:33 PM 5.3 4.6 - 6.5 % Final    Comment:     Glycemic Control Guidelines for People with Diabetes:Non Diabetic:  <6%Goal of Therapy: <7%Additional Action Suggested:  >8%    CBG: Recent Labs  Lab 11/14/22 1835 11/14/22 1920 11/14/22 2325 11/15/22 0340  GLUCAP 89 142* 340* 172*     Critical care time: 35 minutes   The patient is critically ill with multiple organ system failure and requires high complexity decision making for assessment and support, frequent evaluation and titration of therapies, advanced monitoring, review of radiographic studies and interpretation of complex data.   Critical Care Time devoted to patient care services, exclusive of separately billable procedures, described in this note is 35 minutes.   Baltazar Apo, MD, PhD 11/15/2022, 7:25 AM [facility] [phone] or if no answer before 7:00PM call [phone] For any issues after 7:00PM please call eLink [phone]

## 2022-11-15 NOTE — Progress Notes (Signed)
ANTICOAGULATION CONSULT NOTE - Follow Up Consult  Pharmacy Consult for Heparin Indication: chest pain/ACS  Allergies  Allergen Reactions   Penicillin G Other (See Comments), Rash and Nausea And Vomiting    Sore throat   Penicillins Nausea And Vomiting, Rash and Other (See Comments)    Fever Has patient had a PCN reaction causing immediate rash, facial/tongue/throat swelling, SOB or lightheadedness with hypotension: Yes Has patient had a PCN reaction causing severe rash involving mucus membranes or skin necrosis: Unknown Has patient had a PCN reaction that required hospitalization: No Has patient had a PCN reaction occurring within the last 10 years: No If all of the above answers are "NO", then may proceed with Cephalosporin use.     Patient Measurements: Height: '5\' 3"'$  (160 cm) Weight: 44.5 kg (98 lb) IBW/kg (Calculated) : 52.4 Heparin Dosing Weight: 44.5 kg  Vital Signs: Temp: 97.8 F (36.6 C) (03/13 0345) Temp Source: Oral (03/13 0345) BP: 130/81 (03/13 0308) Pulse Rate: 60 (03/13 0840)  Labs: Recent Labs    11/14/22 0023 11/14/22 0422 11/14/22 2000 11/14/22 2002 11/14/22 2318 11/15/22 0103 11/15/22 0538 11/15/22 0846  HGB 13.0  --  11.8* 11.9*  --  12.6 12.2  --   HCT 40.4  --  35.3* 35.0*  --  37.0 34.7*  --   PLT 282  --  282  --   --   --  247  --   LABPROT  --   --  18.4*  --   --   --   --   --   INR  --   --  1.5*  --   --   --   --   --   HEPARINUNFRC  --   --   --   --   --   --   --  0.53  CREATININE  --  0.75 0.86  --   --   --  0.81  --   TROPONINIHS  --   --  318*  --  1,359*  --   --   --     Estimated Creatinine Clearance: 56.4 mL/min (by C-G formula based on SCr of 0.81 mg/dL).   Assessment: 52 yo female s/p Vfib arrest found to have elevated troponin (1,359) suspecting ACS. Pharmacy consulted to start IV heparin. Pt is not on oral anticoagulation prior to admission. Six hour heparin level came back at 0.53 therapeutic. CBC within normal  limits (Hgb: 12.2, Plt: 247).  Goal of Therapy:  Heparin level 0.3-0.7 units/ml Monitor platelets by anticoagulation protocol: Yes   Plan:  Continue IV heparin at 550 units/hour  Check confirmatory 6hr heparin level Monitor Daily CBC and heparin level Monitor for signs of bleeding   Tanja Port, Student Pharm-D 11/15/2022,10:01 AM

## 2022-11-16 ENCOUNTER — Encounter: Payer: 59 | Admitting: Gastroenterology

## 2022-11-16 ENCOUNTER — Encounter (HOSPITAL_COMMUNITY)
Admission: EM | Disposition: A | Discharge status: Other Acute Inpatient Hospital | Payer: Self-pay | Source: Home / Self Care | Attending: Pulmonary Disease

## 2022-11-16 DIAGNOSIS — I5021 Acute systolic (congestive) heart failure: Secondary | ICD-10-CM | POA: Insufficient documentation

## 2022-11-16 DIAGNOSIS — J9601 Acute respiratory failure with hypoxia: Secondary | ICD-10-CM | POA: Diagnosis not present

## 2022-11-16 DIAGNOSIS — I429 Cardiomyopathy, unspecified: Secondary | ICD-10-CM | POA: Diagnosis not present

## 2022-11-16 HISTORY — PX: RIGHT/LEFT HEART CATH AND CORONARY ANGIOGRAPHY: CATH118266

## 2022-11-16 LAB — POCT I-STAT EG7
Acid-base deficit: 3 mmol/L — ABNORMAL HIGH (ref 0.0–2.0)
Acid-base deficit: 4 mmol/L — ABNORMAL HIGH (ref 0.0–2.0)
Acid-base deficit: 4 mmol/L — ABNORMAL HIGH (ref 0.0–2.0)
Acid-base deficit: 3 mmol/L — ABNORMAL HIGH (ref 0.0–2.0)
Acid-base deficit: 3 mmol/L — ABNORMAL HIGH (ref 0.0–2.0)
Acid-base deficit: 3 mmol/L — ABNORMAL HIGH (ref 0.0–2.0)
Bicarbonate: 20.5 mmol/L (ref 20.0–28.0)
Bicarbonate: 20.8 mmol/L (ref 20.0–28.0)
Bicarbonate: 21.5 mmol/L (ref 20.0–28.0)
Bicarbonate: 21.8 mmol/L (ref 20.0–28.0)
Bicarbonate: 21.8 mmol/L (ref 20.0–28.0)
Bicarbonate: 21.1 mmol/L (ref 20.0–28.0)
Calcium, Ion: 1.15 mmol/L (ref 1.15–1.40)
Calcium, Ion: 1.21 mmol/L (ref 1.15–1.40)
Calcium, Ion: 1.23 mmol/L (ref 1.15–1.40)
Calcium, Ion: 1.2 mmol/L (ref 1.15–1.40)
Calcium, Ion: 1.22 mmol/L (ref 1.15–1.40)
Calcium, Ion: 1.18 mmol/L (ref 1.15–1.40)
HCT: 32 % — ABNORMAL LOW (ref 36.0–46.0)
HCT: 32 % — ABNORMAL LOW (ref 36.0–46.0)
HCT: 32 % — ABNORMAL LOW (ref 36.0–46.0)
HCT: 31 % — ABNORMAL LOW (ref 36.0–46.0)
HCT: 32 % — ABNORMAL LOW (ref 36.0–46.0)
HCT: 31 % — ABNORMAL LOW (ref 36.0–46.0)
Hemoglobin: 10.9 g/dL — ABNORMAL LOW (ref 12.0–15.0)
Hemoglobin: 10.9 g/dL — ABNORMAL LOW (ref 12.0–15.0)
Hemoglobin: 10.9 g/dL — ABNORMAL LOW (ref 12.0–15.0)
Hemoglobin: 10.5 g/dL — ABNORMAL LOW (ref 12.0–15.0)
Hemoglobin: 10.9 g/dL — ABNORMAL LOW (ref 12.0–15.0)
Hemoglobin: 10.5 g/dL — ABNORMAL LOW (ref 12.0–15.0)
O2 Saturation: 73 %
O2 Saturation: 78 %
O2 Saturation: 82 %
O2 Saturation: 78 %
O2 Saturation: 80 %
O2 Saturation: 76 %
Potassium: 3.8 mmol/L (ref 3.5–5.1)
Potassium: 3.8 mmol/L (ref 3.5–5.1)
Potassium: 3.9 mmol/L (ref 3.5–5.1)
Potassium: 3.8 mmol/L (ref 3.5–5.1)
Potassium: 4 mmol/L (ref 3.5–5.1)
Potassium: 3.7 mmol/L (ref 3.5–5.1)
Sodium: 144 mmol/L (ref 135–145)
Sodium: 145 mmol/L (ref 135–145)
Sodium: 145 mmol/L (ref 135–145)
Sodium: 144 mmol/L (ref 135–145)
Sodium: 145 mmol/L (ref 135–145)
Sodium: 145 mmol/L (ref 135–145)
TCO2: 22 mmol/L (ref 22–32)
TCO2: 22 mmol/L (ref 22–32)
TCO2: 23 mmol/L (ref 22–32)
TCO2: 23 mmol/L (ref 22–32)
TCO2: 23 mmol/L (ref 22–32)
TCO2: 22 mmol/L (ref 22–32)
pCO2, Ven: 33.9 mmHg — ABNORMAL LOW (ref 44–60)
pCO2, Ven: 34.5 mmHg — ABNORMAL LOW (ref 44–60)
pCO2, Ven: 36.2 mmHg — ABNORMAL LOW (ref 44–60)
pCO2, Ven: 35.7 mmHg — ABNORMAL LOW (ref 44–60)
pCO2, Ven: 36 mmHg — ABNORMAL LOW (ref 44–60)
pCO2, Ven: 34.1 mmHg — ABNORMAL LOW (ref 44–60)
pH, Ven: 7.381 (ref 7.25–7.43)
pH, Ven: 7.388 (ref 7.25–7.43)
pH, Ven: 7.39 (ref 7.25–7.43)
pH, Ven: 7.389 (ref 7.25–7.43)
pH, Ven: 7.393 (ref 7.25–7.43)
pH, Ven: 7.399 (ref 7.25–7.43)
pO2, Ven: 38 mmHg (ref 32–45)
pO2, Ven: 43 mmHg (ref 32–45)
pO2, Ven: 46 mmHg — ABNORMAL HIGH (ref 32–45)
pO2, Ven: 43 mmHg (ref 32–45)
pO2, Ven: 45 mmHg (ref 32–45)
pO2, Ven: 41 mmHg (ref 32–45)

## 2022-11-16 LAB — CBC
HCT: 32.1 % — ABNORMAL LOW (ref 36.0–46.0)
Hemoglobin: 11.2 g/dL — ABNORMAL LOW (ref 12.0–15.0)
MCH: 32.2 pg (ref 26.0–34.0)
MCHC: 34.9 g/dL (ref 30.0–36.0)
MCV: 92.2 fL (ref 80.0–100.0)
Platelets: 274 10*3/uL (ref 150–400)
RBC: 3.48 MIL/uL — ABNORMAL LOW (ref 3.87–5.11)
RDW: 15.2 % (ref 11.5–15.5)
WBC: 12.6 10*3/uL — ABNORMAL HIGH (ref 4.0–10.5)
nRBC: 0 % (ref 0.0–0.2)

## 2022-11-16 LAB — COMPREHENSIVE METABOLIC PANEL
ALT: 134 U/L — ABNORMAL HIGH (ref 0–44)
AST: 87 U/L — ABNORMAL HIGH (ref 15–41)
Albumin: 2.5 g/dL — ABNORMAL LOW (ref 3.5–5.0)
Alkaline Phosphatase: 41 U/L (ref 38–126)
Anion gap: 10 (ref 5–15)
BUN: 15 mg/dL (ref 6–20)
CO2: 21 mmol/L — ABNORMAL LOW (ref 22–32)
Calcium: 8.4 mg/dL — ABNORMAL LOW (ref 8.9–10.3)
Chloride: 109 mmol/L (ref 98–111)
Creatinine, Ser: 0.88 mg/dL (ref 0.44–1.00)
GFR, Estimated: >60 mL/min (ref >60)
Glucose, Bld: 118 mg/dL — ABNORMAL HIGH (ref 70–99)
Potassium: 3.4 mmol/L — ABNORMAL LOW (ref 3.5–5.1)
Sodium: 140 mmol/L (ref 135–145)
Total Bilirubin: 0.7 mg/dL (ref 0.3–1.2)
Total Protein: 5.5 g/dL — ABNORMAL LOW (ref 6.5–8.1)

## 2022-11-16 LAB — COOXEMETRY PANEL
Carboxyhemoglobin: 1.1 % (ref 0.5–1.5)
Methemoglobin: <0.7 % (ref 0.0–1.5)
O2 Saturation: 67.8 %
Total hemoglobin: 11.7 g/dL — ABNORMAL LOW (ref 12.0–16.0)

## 2022-11-16 LAB — TROPONIN I (HIGH SENSITIVITY): Troponin I (High Sensitivity): 305 ng/L (ref <18)

## 2022-11-16 LAB — GLUCOSE, CAPILLARY
Glucose-Capillary: 106 mg/dL — ABNORMAL HIGH (ref 70–99)
Glucose-Capillary: 107 mg/dL — ABNORMAL HIGH (ref 70–99)
Glucose-Capillary: 111 mg/dL — ABNORMAL HIGH (ref 70–99)
Glucose-Capillary: 113 mg/dL — ABNORMAL HIGH (ref 70–99)
Glucose-Capillary: 116 mg/dL — ABNORMAL HIGH (ref 70–99)
Glucose-Capillary: 122 mg/dL — ABNORMAL HIGH (ref 70–99)

## 2022-11-16 LAB — PHOSPHORUS
Phosphorus: 3 mg/dL (ref 2.5–4.6)
Phosphorus: 2.7 mg/dL (ref 2.5–4.6)

## 2022-11-16 LAB — HEPARIN LEVEL (UNFRACTIONATED): Heparin Unfractionated: 0.3 IU/mL (ref 0.30–0.70)

## 2022-11-16 LAB — MAGNESIUM
Magnesium: 2.2 mg/dL (ref 1.7–2.4)
Magnesium: 2.1 mg/dL (ref 1.7–2.4)

## 2022-11-16 LAB — LACTIC ACID, PLASMA: Lactic Acid, Venous: 1 mmol/L (ref 0.5–1.9)

## 2022-11-16 SURGERY — RIGHT/LEFT HEART CATH AND CORONARY ANGIOGRAPHY
Anesthesia: LOCAL

## 2022-11-16 MED ORDER — ONDANSETRON HCL 4 MG/2ML IJ SOLN: 4.0000 mg | Freq: Four times a day (QID) | INTRAMUSCULAR | Status: DC | PRN

## 2022-11-16 MED ORDER — SODIUM CHLORIDE 0.9 % WEIGHT BASED INFUSION: 1.0000 mL/kg/h | INTRAVENOUS | Status: AC

## 2022-11-16 MED ORDER — LIDOCAINE HCL (PF) 1 % IJ SOLN
INTRAMUSCULAR | Status: AC
  Filled 2022-11-16: qty 30

## 2022-11-16 MED ORDER — VERAPAMIL HCL 2.5 MG/ML IV SOLN
INTRAVENOUS | Status: AC
  Filled 2022-11-16: qty 2

## 2022-11-16 MED ORDER — ASPIRIN 81 MG PO CHEW: 81.0000 mg | CHEWABLE_TABLET | ORAL | Status: DC

## 2022-11-16 MED ORDER — ENOXAPARIN SODIUM 40 MG/0.4ML IJ SOSY: 40.0000 mg | PREFILLED_SYRINGE | INTRAMUSCULAR | Status: DC

## 2022-11-16 MED ORDER — LABETALOL HCL 5 MG/ML IV SOLN: 10.0000 mg | INTRAVENOUS | Status: AC | PRN

## 2022-11-16 MED ORDER — HEPARIN SODIUM (PORCINE) 1000 UNIT/ML IJ SOLN
INTRAMUSCULAR | Status: AC
  Filled 2022-11-16: qty 10

## 2022-11-16 MED ORDER — HYDRALAZINE HCL 20 MG/ML IJ SOLN: 10.0000 mg | INTRAMUSCULAR | Status: AC | PRN

## 2022-11-16 MED ORDER — SODIUM CHLORIDE 0.9% FLUSH
3.0000 mL | INTRAVENOUS | Status: DC | PRN
  Administered 2022-12-01: 3 mL via INTRAVENOUS

## 2022-11-16 MED ORDER — HEPARIN (PORCINE) IN NACL 1000-0.9 UT/500ML-% IV SOLN
INTRAVENOUS | Status: DC | PRN
  Administered 2022-11-16 (×2): 500 mL

## 2022-11-16 MED ORDER — ASPIRIN 81 MG PO CHEW: 81.0000 mg | CHEWABLE_TABLET | Freq: Every day | ORAL | Status: DC

## 2022-11-16 MED ORDER — IOHEXOL 350 MG/ML SOLN
INTRAVENOUS | Status: DC | PRN
  Administered 2022-11-16: 20 mL

## 2022-11-16 MED ORDER — MIDAZOLAM HCL 2 MG/2ML IJ SOLN
INTRAMUSCULAR | Status: AC
  Filled 2022-11-16: qty 2

## 2022-11-16 MED ORDER — SODIUM CHLORIDE 0.9% FLUSH
3.0000 mL | Freq: Two times a day (BID) | INTRAVENOUS | Status: DC
  Administered 2022-11-16 – 2022-12-06 (×25): 3 mL via INTRAVENOUS

## 2022-11-16 MED ORDER — LIDOCAINE HCL (PF) 1 % IJ SOLN
INTRAMUSCULAR | Status: DC | PRN
  Administered 2022-11-16: 20 mL

## 2022-11-16 MED ORDER — SODIUM CHLORIDE 0.9 % IV SOLN
250.0000 mL | INTRAVENOUS | Status: DC | PRN
  Administered 2022-11-24: 1000 mL via INTRAVENOUS
  Administered 2022-11-25 – 2022-11-29 (×3): 250 mL via INTRAVENOUS

## 2022-11-16 MED ORDER — ASPIRIN 81 MG PO CHEW
81.0000 mg | CHEWABLE_TABLET | ORAL | Status: AC
  Administered 2022-11-16: 81 mg via ORAL
  Filled 2022-11-16: qty 1

## 2022-11-16 MED ORDER — ENOXAPARIN SODIUM 30 MG/0.3ML IJ SOSY
30.0000 mg | PREFILLED_SYRINGE | INTRAMUSCULAR | Status: DC
  Administered 2022-11-16 – 2022-12-06 (×21): 30 mg via SUBCUTANEOUS
  Filled 2022-11-16 (×21): qty 0.3

## 2022-11-16 MED ORDER — SODIUM CHLORIDE 0.9 % WEIGHT BASED INFUSION: 10.0000 mL/h | INTRAVENOUS | Status: DC

## 2022-11-16 MED ORDER — ACETAMINOPHEN 325 MG PO TABS: 650.0000 mg | ORAL_TABLET | ORAL | Status: DC | PRN

## 2022-11-16 MED ORDER — POTASSIUM CHLORIDE 20 MEQ PO PACK
40.0000 meq | PACK | Freq: Once | ORAL | Status: AC
  Administered 2022-11-16: 40 meq
  Filled 2022-11-16: qty 2

## 2022-11-16 SURGICAL SUPPLY — 12 items
CATH INFINITI 5FR MULTPACK ANG (CATHETERS) IMPLANT
CATH SWAN GANZ VIP 7.5F (CATHETERS) IMPLANT
KIT HEART LEFT (KITS) ×1 IMPLANT
KIT MICROPUNCTURE NIT STIFF (SHEATH) IMPLANT
MAT PREVALON FULL STRYKER (MISCELLANEOUS) IMPLANT
PACK CARDIAC CATHETERIZATION (CUSTOM PROCEDURE TRAY) ×1 IMPLANT
SHEATH PINNACLE 5F 10CM (SHEATH) IMPLANT
SHEATH PINNACLE 7F 10CM (SHEATH) IMPLANT
SHEATH PROBE COVER 6X72 (BAG) IMPLANT
TRANSDUCER W/STOPCOCK (MISCELLANEOUS) ×1 IMPLANT
WIRE EMERALD 3MM-J .035X150CM (WIRE) IMPLANT
WIRE MICROINTRODUCER 60CM (WIRE) IMPLANT

## 2022-11-16 NOTE — Progress Notes (Signed)
Pt transported on vent from 2M15 to Cath lab without any complications. RN at bedside, RT will monitor.

## 2022-11-16 NOTE — Progress Notes (Addendum)
Advanced Heart Failure Rounding Note  PCP-Cardiologist: Fransico Him, MD   Subjective:    Remains intubated and sedated. On NE 1. MAPs mid 80s. CVP 5.   A-paced, 60s. No VT/VF.   Objective:   Weight Range: 44.5 kg Body mass index is 17.36 kg/m.   Vital Signs:   Temp:  [93 F (33.9 C)-99.7 F (37.6 C)] 99 F (37.2 C) (03/14 0645) Pulse Rate:  [60-89] 70 (03/14 0645) Resp:  [13-27] 19 (03/14 0645) SpO2:  [99 %-100 %] 100 % (03/14 0645) FiO2 (%):  [40 %] 40 % (03/14 0400) Last BM Date : 11/15/22  Weight change: Filed Weights   11/11/22 1211  Weight: 44.5 kg    Intake/Output:   Intake/Output Summary (Last 24 hours) at 11/16/2022 0741 Last data filed at 11/16/2022 0639 Gross per 24 hour  Intake 1436.23 ml  Output 1250 ml  Net 186.23 ml      Physical Exam    CVP 5  General:  intubated and sedated  HEENT: Normal + ETT Neck: Supple. JVP not elevated . Carotids 2+ bilat; no bruits. No lymphadenopathy or thyromegaly appreciated. Cor: PMI nondisplaced. Regular rate & rhythm. No rubs, gallops or murmurs. Lungs: intubated and clear Abdomen: Soft, nontender, nondistended. No hepatosplenomegaly. No bruits or masses. Good bowel sounds. Extremities: No cyanosis, clubbing, rash, edema Neuro: intubated and sedated    Telemetry   A-paced 60s. No further VT/VF  EKG    No new EKG to review   Labs    CBC Recent Labs    11/15/22 0538 11/16/22 0328  WBC 17.3* 12.6*  HGB 12.2 11.2*  HCT 34.7* 32.1*  MCV 93.3 92.2  PLT 247 [id number]   Basic Metabolic Panel Recent Labs    11/15/22 1500 11/16/22 0328  NA 141 140  K 4.0 3.4*  CL 107 109  CO2 21* 21*  GLUCOSE 143* 118*  BUN 7 15  CREATININE 0.81 0.88  CALCIUM 8.9 8.4*  MG 2.5* 2.2  PHOS 4.1 3.0   Liver Function Tests Recent Labs    11/15/22 1500 11/16/22 0328  AST 234* 87*  ALT 187* 134*  ALKPHOS 50 41  BILITOT 0.4 0.7  PROT 6.0* 5.5*  ALBUMIN 2.8* 2.5*   No results for input(s): "LIPASE",  "AMYLASE" in the last 72 hours. Cardiac Enzymes No results for input(s): "CKTOTAL", "CKMB", "CKMBINDEX", "TROPONINI" in the last 72 hours.  BNP: BNP (last 3 results) Recent Labs    11/11/22 1238  BNP 58.5    ProBNP (last 3 results) No results for input(s): "PROBNP" in the last 8760 hours.   D-Dimer No results for input(s): "DDIMER" in the last 72 hours. Hemoglobin A1C Recent Labs    11/15/22 1829  HGBA1C 5.6   Fasting Lipid Panel No results for input(s): "CHOL", "HDL", "LDLCALC", "TRIG", "CHOLHDL", "LDLDIRECT" in the last 72 hours. Thyroid Function Tests No results for input(s): "TSH", "T4TOTAL", "T3FREE", "THYROIDAB" in the last 72 hours.  Invalid input(s): "FREET3"  Other results:   Imaging    DG Abd Portable 1V  Result Date: 11/15/2022 CLINICAL DATA:  Feeding tube placement EXAM: PORTABLE ABDOMEN - 1 VIEW COMPARISON:  Portable exam 1401 hours compared to 11/14/2022 FINDINGS: Tip of feeding tube projects over gastric antrum. External pacing leads project over upper abdomen. Lung bases clear. Nonobstructive bowel gas pattern. IMPRESSION: Tip of feeding tube projects over gastric antrum. Electronically Signed   By: Lavonia Dana M.D.   On: 11/15/2022 14:35   ECHOCARDIOGRAM COMPLETE  Result Date: 11/15/2022    ECHOCARDIOGRAM REPORT   Patient Name:   Cynthia Underwood Date of Exam: 11/15/2022 Medical Rec #:  [id number]         Height:       63.0 in Accession #:    [id number]        Weight:       98.0 lb Date of Birth:  Nov 16, 1970        BSA:          1.428 m Patient Age:    3 years          BP:           128/82 mmHg Patient Gender: F                 HR:           61 bpm. Exam Location:  Inpatient Procedure: 2D Echo, Color Doppler and Cardiac Doppler STAT ECHO Indications:    Cardiac Arrest i46.9  History:        Patient has prior history of Echocardiogram examinations, most                 recent 01/13/2021. Pacemaker; Arrythmias:RBBB.  Sonographer:    Raquel Sarna Senior RDCS  Referring Phys: [facility]  Sonographer Comments: Technically difficult study due to thin body habitus, scanned supine on artificial respirator. IMPRESSIONS  1. LVEF is severely depressed with diffuse hypokinesis; inferior/inferolateral akinesis Compared to echo from 2022 this is a significant change. . Left ventricular ejection fraction, by estimation, is 20%. The left ventricle has severely decreased function.  2. Right ventricular systolic function is severely reduced. The right ventricular size is normal. There is normal pulmonary artery systolic pressure.  3. The mitral valve is normal in structure. Trivial mitral valve regurgitation.  4. The aortic valve is normal in structure. Aortic valve regurgitation is not visualized.  5. The inferior vena cava is dilated in size with <50% respiratory variability, suggesting right atrial pressure of 15 mmHg. FINDINGS  Left Ventricle: LVEF is severely depressed with diffuse hypokinesis; inferior/inferolateral akinesis Compared to echo from 2022 this is a significant change. Left ventricular ejection fraction, by estimation, is 20%. The left ventricle has severely decreased function. The left ventricular internal cavity size was normal in size. There is no left ventricular hypertrophy. Right Ventricle: The right ventricular size is normal. Right vetricular wall thickness was not assessed. Right ventricular systolic function is severely reduced. There is normal pulmonary artery systolic pressure. The tricuspid regurgitant velocity is 1.79 m/s, and with an assumed right atrial pressure of 15 mmHg, the estimated right ventricular systolic pressure is 0000000 mmHg. Left Atrium: Left atrial size was normal in size. Right Atrium: Right atrial size was normal in size. Pericardium: There is no evidence of pericardial effusion. Mitral Valve: The mitral valve is normal in structure. Trivial mitral valve regurgitation. Tricuspid Valve: The tricuspid valve is normal in structure.  Tricuspid valve regurgitation is mild. Aortic Valve: The aortic valve is normal in structure. Aortic valve regurgitation is not visualized. Pulmonic Valve: The pulmonic valve was normal in structure. Pulmonic valve regurgitation is mild. Aorta: The aortic root and ascending aorta are structurally normal, with no evidence of dilitation. Venous: The inferior vena cava is dilated in size with less than 50% respiratory variability, suggesting right atrial pressure of 15 mmHg. IAS/Shunts: No atrial level shunt detected by color flow Doppler.  LEFT VENTRICLE PLAX 2D LVIDd:  2.75 cm     Diastology LVIDs:         2.80 cm     LV e' medial:    3.48 cm/s LV PW:         0.80 cm     LV E/e' medial:  11.3 LV IVS:        0.70 cm     LV e' lateral:   4.13 cm/s LVOT diam:     1.80 cm     LV E/e' lateral: 9.5 LV SV:         25 LV SV Index:   17 LVOT Area:     2.54 cm  LV Volumes (MOD) LV vol d, MOD A2C: 53.5 ml LV vol d, MOD A4C: 58.4 ml LV vol s, MOD A2C: 36.2 ml LV vol s, MOD A4C: 38.2 ml LV SV MOD A2C:     17.3 ml LV SV MOD A4C:     58.4 ml LV SV MOD BP:      21.4 ml RIGHT VENTRICLE RV S prime:     5.98 cm/s TAPSE (M-mode): 0.8 cm LEFT ATRIUM           Index       RIGHT ATRIUM          Index LA diam:      1.40 cm 0.98 cm/m  RA Area:     7.91 cm LA Vol (A2C): 10.1 ml 7.07 ml/m  RA Volume:   14.90 ml 10.43 ml/m LA Vol (A4C): 7.0 ml  4.90 ml/m  AORTIC VALVE LVOT Vmax:   54.30 cm/s LVOT Vmean:  36.700 cm/s LVOT VTI:    0.098 m  AORTA Ao Root diam: 2.70 cm Ao Asc diam:  2.60 cm MITRAL VALVE               TRICUSPID VALVE MV Area (PHT): 2.79 cm    TR Peak grad:   12.8 mmHg MV Decel Time: 272 msec    TR Vmax:        179.00 cm/s MV E velocity: 39.30 cm/s MV A velocity: 75.50 cm/s  SHUNTS MV E/A ratio:  0.52        Systemic VTI:  0.10 m                            Systemic Diam: 1.80 cm Dorris Carnes MD Electronically signed by Dorris Carnes MD Signature Date/Time: 11/15/2022/9:26:43 AM    Final      Medications:     Scheduled  Medications:  aspirin  81 mg Oral Pre-Cath   [START ON 11/17/2022] aspirin  81 mg Per Tube Daily   Chlorhexidine Gluconate Cloth  6 each Topical Daily   feeding supplement (PROSource TF20)  60 mL Per Tube Daily   insulin aspart  0-9 Units Subcutaneous Q4H   levothyroxine  112 mcg Per Tube QAC breakfast   methylPREDNISolone (SOLU-MEDROL) injection  40 mg Intravenous Daily   mouth rinse  15 mL Mouth Rinse Q2H   pantoprazole (PROTONIX) IV  40 mg Intravenous QHS   sucralfate  1 g Per Tube BID   thiamine  100 mg Per Tube Daily    Infusions:  sodium chloride     cefTRIAXone (ROCEPHIN)  IV Stopped (11/15/22 2203)   dexmedetomidine (PRECEDEX) IV infusion 0.6 mcg/kg/hr (11/16/22 0600)   feeding supplement (VITAL 1.5 CAL) Stopped (11/16/22 0134)   fentaNYL infusion INTRAVENOUS 200 mcg/hr (11/16/22 0600)  heparin 550 Units/hr (11/16/22 0600)   lactated ringers 50 mL/hr at 11/16/22 0600   norepinephrine (LEVOPHED) Adult infusion 2 mcg/min (11/16/22 0600)    PRN Medications: acetaminophen, docusate, fentaNYL, guaiFENesin, ipratropium-albuterol, melatonin, midazolam, ondansetron (ZOFRAN) IV, mouth rinse, polyethylene glycol    Patient Profile   52 y.o. female with a hx of tobacco use, COPD, myotonic dystrophy type I since age 3, high grade heart block s/p PPM, RBBB, sickle cell trait, and DM1 (questionable diagnosis) and strong family history of heart failure (father s/p cardiac transplant and paternal aunt w/ NICM w/ LVAD), admitted w/ acute hypoxic respiratory failure/ LLL CAP secondary to metapneumovirus. Hospital course c/b VF arrest. Post arrest echo w/ severe BiV failure, LVEF < 20%, RV severely reduced + diffuse HK   Assessment/Plan   1. Acute hypoxemic respiratory failure: Intubated post-VF arrest.  Had viral PNA with metapneumovirus at presentation.  - Per CCM.  Plan currently is to keep intubated until after cath.  - On Solumedrol for bronchospasm.  2. High grade heart block: Has  MDT PPM with His bundle lead.  This is likely due to cardiac involvement of myotonic dystrophy type 1.  3. VF arrest: 3/12, shocked. 15 minutes CPR total.  - Low threshold to restart amiodarone if further ectopy.  - Plan right/left cath today (see below), will need to eventually involve EP if cath negative for secondary prevention ICD upgrade.  4. Acute systolic CHF: Prior workup for cardiac sarcoidosis. Cardiac MRI in 2017 showed subendocardial LGE in the mid inferior and inferolateral walls with EF 56%.  Cardiac PET done after this was NOT suggestive of cardiac sarcoidosis.   Echo in 5/22 with normal EF.  Echo this admission reviewed, EF 20-25% range with diffuse hypokinesis but relatively preserved function at the apex, moderate RV dysfunction.  Cause of cardiomyopathy is uncertain.  She is a smoker and noted to be hyperglycemic this admission.  There is also some regionality to her wall motion abnormalities on echo though not clear it is in a CAD pattern.  Cannot rule out ischemic cardiomyopathy.  Her complete heart block is most likely related to myotonic dystrophy diagnosis with cardiac involvement, therefore would not be surprised if her cardiomyopathy is also due to myotonic dystrophy.  She has an aunt with LVAD who has cardiomyopathy due to myotonic dystrophy.  Father had a heart transplant, uncertain of underlying condition.  She is on NE 1 currently, can likely be titrated off.  - LHC/RHC today.  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  Discussed risks/benefits of cath with daughter and mother (patient intubated), they agree to procedure.  - If no significant coronary disease, would eventually repeat cardiac MRI.  LGE pattern is often mid-myocardial inferolateral wall with myotonic dystrophy dilated cardiomyopathy.  - She has MDT PPM with his bundle lead but paced QRS is very wide.  If no revascularizable  coronary disease, would recommend upgrade of device to CRT-D (need more narrow paced QRS than we are getting with her his pacing).  5. CAD: HS-TnI peaked at 1359 in setting of VF arrest then trended down.  No prior chest pain.   - As above, needs coronary angiography to rule out CAD.  6. Elevated LFTs: Trending down, suspect shock liver/hypoperfusion in setting of VF arrest.  7. Smoker: She will need to quit.  8. Sickle cell trait 9. Hyperglycemia: Patient does not carry the diagnosis of diabetes but blood  glucose has been high in the hospital, using insulin.   - Hgb A1c 5.5.  10. Myotonic dystrophy: Type 1, diagnosed at age 39. Has family members with the same diagnosis.  Followed by neurology.   Length of Stay: 2  Lyda Jester, PA-C  11/16/2022, 7:41 AM  Advanced Heart Failure Team Pager [phone] (M-F; 7a - 5p)  Please contact [facility] for night-coverage after hours (5p -7a ) and weekends on [email or web address]  Patient seen with PA, agree with the above note.   RHC/LHC done today, see below: Coronary Findings  Diagnostic Dominance: Right Left Main  Vessel was injected. Vessel is normal in caliber. Vessel is angiographically normal.    Left Anterior Descending  Vessel was injected. Vessel is normal in caliber. Vessel is angiographically normal.    Left Circumflex  Vessel was injected. Vessel is normal in caliber. Vessel is angiographically normal.    Right Coronary Artery  Vessel was injected. Vessel is normal in caliber. Vessel is angiographically normal.    Intervention   No interventions have been documented.   Right Heart  Right Heart Pressures RHC Procedural Findings (on norepinephrine 2): Hemodynamics (mmHg) RA mean 9 RV 25/13 PA 25/14, mean 19 PCWP mean 9 LV 153/16 AO 159/79  Oxygen saturations: IVC 76% RA 80% RV 78% PA 80% AO 100%  Cardiac Output (Fick) 6.27  Cardiac Index (Fick) 4.37  Cardiac Output (thermo) 2.18 (??) Cardiac Index (thermo)  1.52 (??)   Patient remains intubated/sedated, will wake up and follow commands.   General: NAD, intubated Neck: No JVD, no thyromegaly or thyroid nodule.  Lungs: Clear to auscultation bilaterally with normal respiratory effort. CV: Nondisplaced PMI.  Heart regular S1/S2, no S3/S4, no murmur.  No peripheral edema.   Abdomen: Soft, nontender, no hepatosplenomegaly, no distention.  Skin: Intact without lesions or rashes.  Neurologic: Will awaken and follow commands.  Extremities: No clubbing or cyanosis.  HEENT: Normal.   Suspect ready for extubation soon, per CCM.    Nonischemic cardiomyopathy, suspect due to myotonic dystrophy type 1.  Filling pressures minimally elevated on RHC today.  Excellent cardiac output by Fick and no evidence for shunt lesion.  CI low by thermodilution.  Clinically, think Fick is more likely to be accurate.  - SBP 150s, should be able to wean off norepinephrine.  - Gradual introduction of GDMT after she is off NE.  - She will need cardiac MRI repeated eventually.  - No further ventricular arrhythmias.  With VF arrest, she will need upgrade of device to ICD for secondary prevention once she is extubated and stable.  Has MDT PPM with His lead, QRS noted to be very wide when paced.  Will repeat ECG this morning. ?Transition to left bundle or CS lead if she has a high v-pacing burden.   CRITICAL CARE Performed by: Loralie Champagne  Total critical care time: 35 minutes  Critical care time was exclusive of separately billable procedures and treating other patients.  Critical care was necessary to treat or prevent imminent or life-threatening deterioration.  Critical care was time spent personally by me on the following activities: development of treatment plan with patient and/or surrogate as well as nursing, discussions with consultants, evaluation of patient's response to treatment, examination of patient, obtaining history from patient or surrogate, ordering and  performing treatments and interventions, ordering and review of laboratory studies, ordering and review of radiographic studies, pulse oximetry and re-evaluation of patient's condition.  Loralie Champagne 11/16/2022 10:37 AM

## 2022-11-16 NOTE — Progress Notes (Signed)
PT Cancellation Note  Patient Details Name: Cynthia Underwood MRN: [id number] DOB: 11-Dec-1970   Cancelled Treatment:    Reason Eval/Treat Not Completed: Patient at procedure or test/unavailable (Pt in CATH lab.  Will check back tomorrow as pt will be on bedrest post cath.  Thanks.)   [name] 11/16/2022, 9:29 AM Coner Gibbard M,PT Acute Rehab Services [phone]

## 2022-11-16 NOTE — Consult Note (Addendum)
ELECTROPHYSIOLOGY CONSULT NOTE    Patient ID: EMARY FRAGOSO MRN: [id number], DOB/AGE: 54-Aug-1972 54 y.o.  Admit date: 11/11/2022 Date of Consult: 11/17/2022  Primary Physician: Charlott Rakes, MD Primary Cardiologist: Fransico Him, MD  Electrophysiologist: Dr. Caryl Comes    Referring Provider: Dr. Harrington Challenger  Patient Profile: Cynthia Underwood is a 52 y.o. female with a history of Myotonic dystrophy (genetically confirmed), bifascicular block, advanced AV block s/p MDT DDD PPM, anemia, Goiter s/p thyroidectomy, Sickle cell trait, who is being seen today for the evaluation of VT/VF arrest at the request of Dr. Harrington Challenger.  HPI:  Pt known to EP with history of advanced AV block s/p DDD PPM in 2018 in setting of Myotonic Dystrophy. No sarcoid on workup (PET,cMRI as below).   Pt last seen by EP 05/2021 without complaint.   Pt was admitted to [facility] 11/11/2022 with acute hypoxic resp failure. Started on ABX for LLL CAP 2/2 Human Meta-pneumovirus. Pt also felt to have esophagitis.  Patient was initially progressing as expected 3/11-3/12 until ~1400, sat was noted to be in 80s. Placed on 5L humidified oxygen with slight improvement in sats. Solumedrol and DuoNebs were ordered. Around 1900 patient's RN was informed by telemetry of VF and was found unresponsive and pulseless. Code Blue was called with CPR x 15 minutes + ACLS drugs prior to ROSC. Amiodarone, Epi and defib were administered. Patient was intubated by anesthesia peri-code. IO was placed and Levophed was initiated. Echo showed new biventricular failure.   K was 4.0 am of arrest, 3.2 post arrest. Mg was 2.2 3/11, 1.7 post arrest.   [facility] 11/16/2022 showed normal cors, NICM, near normal filling pressures and PA pressure, Felt to have excellent CO despite discrepancy between Thermo and Fick COs.   Today, pt remains intubated and sedated and history is obtained from the chart.  Per RN she becomes agitated any time sedation is weaned.  CCM plans to attempt  to continue to wean sedation for extubation attempt. [facility]) is present.   Labs Potassium2.9* (03/15 0407) Magnesium  2.2 (03/15 0407) Creatinine, ser  0.76 (03/15 0407) PLT  237 (03/15 0407) HGB  10.6* (03/15 0407) WBC 7.1 (03/15 0407) Troponin I (High Sensitivity)305* (03/14 0516).   TSH 1.978  Allergies, Past Medical, Surgical, Social, and Family Histories have been reviewed and are referenced here-in when relevant for medical decision making.    Inpatient Medications:   aspirin  81 mg Per Tube Daily   Chlorhexidine Gluconate Cloth  6 each Topical Daily   enoxaparin (LOVENOX) injection  30 mg Subcutaneous Q24H   feeding supplement (PROSource TF20)  60 mL Per Tube Daily   insulin aspart  0-9 Units Subcutaneous Q4H   levothyroxine  112 mcg Per Tube QAC breakfast   mouth rinse  15 mL Mouth Rinse Q2H   pantoprazole (PROTONIX) IV  40 mg Intravenous QHS   sodium chloride flush  3 mL Intravenous Q12H   sucralfate  1 g Per Tube BID   thiamine  100 mg Per Tube Daily   Physical Exam: Vitals:   11/17/22 0530 11/17/22 0545 11/17/22 0600 11/17/22 0615  BP:      Pulse: 60 60 60 60  Resp: 16 16 16 16   Temp:      TempSrc:      SpO2: 100% 100% 100% 100%  Weight:      Height:       GEN- The patient is intubated and sedated. Thin.  HEENT- + ET tube.  Lungs- +  mechanical breathing sounds.  Heart- Regular rate and rhythm, no murmurs, rubs or gallops GI- soft Extremities- no clubbing or cyanosis. No edema Skin- no rash or lesion Neuro- intubated and sedated.     Radiology/Studies: [facility] 11/16/2022 - Normal cors, near normal filling pressures, good CO, though difference between pick and thermo  Echo 11/15/2022 - LVEF < 20%, severely reduced RV, trivial MR, RAP ~ 15 mm Hg  CT Angio PE 11/11/2022 - No PE, few patchy ground-glass opacities in RML and BLL. Mild LL bronchial thickening and bronchiolectasis, small pericardial effusion  cMRI 06/2016 LVEF 56% , normal RV, mild  TR Myocardial thinning associated with hypokinesis and subendocardial late gadolinium enhancement in the mid inferior and inferolateral walls is a non-specific findings that could represent either prior non-transmural infarct (low probability in this young female), cardiac sarcoidosis (subendocardial LGE is rare, more frequently there is midwall or subepicardial involvement). This could also represent cardiac involvement in myotonic dystrophy, however the most typical presentation LV dilatation and dysfunction is missing.  PET 10/2016 scan at [facility] negative for sarcoidosis, no myocardial FDG uptake.  EKG:on arrival showed AV dual pacing at 65 bpm (personally reviewed)  TELEMETRY: AV dual paced currently in 60s, occasional NRS/ST 90-100s (personally reviewed)     DEVICE HISTORY:  MDT dual chamber PPM (His Bundle) implanted 2018 for high grade heart block   Assessment/Plan: VF Arrest 0000000 Acute systolic CHF Non-ischemic CMP No further arrhythmias.  Family history includes myotonic dystrophy and "heart disease" LHC 11/16/22 with normal cors, filling pressures, and output despite discrepancy between Fick and Thermo.  Echo 11/15/22 with EF <20%, severe RV dysfunction (Compared to normal EF 01/2021) Can consider updating cMRI pending course, but may not be necessary now that telemetry has been retrieved.  Her RV paced beats are quite wide, but her intrinsic beats are ~120-130 ms.   Advanced AV block s/p MDT PPM with his bundle lead Felt to be 2/2 to Myotonic dystrophy type 1 Historically, V pacing has been ~80-90%. ? If pacing induced component to her CMP.   Acute hypoxemic respiratory failure In setting of viral PNA with human meta-pnuemovirus Cases studies describe very rare myocarditis as complication from HMPV Remains intubated and sedated s/p arrest She did have acute worsening of her hypoxia prior to her arrest, which also may have contributed.  Per CCM plan to start weaning sedation  to attempt extubation.   EP will follow along for plan pending course and disposition.   ADDENDUM  Drug induced Torsades de pointes    With help of Sandy Salaam, we were able to retrieve telemetry from 6N which reveals her VF arrest began with a long-short cycle with a coupling interval of > 600 ms, which is highly sensitive and speicific for torsades.   Given use of azithromycin and potassium depletion, would consider this a reversible cause.  QT on EKG 3/14 remains quiet long at ~560-570.     Avoid QT prolonging agents.  Daily EKGs.  Aggressive K and magnesium supplementation  We have raised her lower rate limit to 90 bpm to overdrive pace her as well.   For questions or updates, please contact [facility] Please consult [email or web address] for contact info under Cardiology/STEMI.  Signed, Shirley Friar, PA-C  11/17/2022 7:00 AM   Complex hx as wiell outlined above Torsade de pointes-secondary contributing factors including azithromycin hypokalemia question form fruste  Nonischemic cardiomyopathy  Myotonic dystrophy  Medtronic pacemaker  Respiratory failure secondary to  human meta-pnuemovirus  Progressive left  ventricular dysfunction   Acutely, her polymorphic ventricular tachycardia associated with a long coupled PVC and a QT interval of 600+ milliseconds is torsade de pointes with triggering/contributing factors including hypokalemia with significant whole body score deficit as suggested by the recurring dropping of her potassium following repletion and azithromycin.  QT prolonging drugs should be avoided potassium magnesium aggressively repleted there is no family history but suspect that there is a form fruste QT issue here and so will be important ongoing to protect her from QT prolonging drugs.  We have also reprogrammed her device to a lower rate of 90 as increased heart rates are associated with QT shortening.  There may be further aggravated by her  acute infection.  Her LV dysfunction remains unexplained and may be related to the viral infection for which a few case reports have been identified to have supported by the modest elevations of her troponin.  Alternatively, her pacemaker may be contributing with a pacemaker cardiomyopathy.  Depending on how it is at things recover, would consider upgrade of her device.  She might benefit from MRI scanning to try to elucidate mechanisms of LV dysfunction i.e. myocarditis Date % Vp LVEF  3/21 7-50%   9/22 71% 60-65  3/23 100%   12/.23` 80% 20%   For now we will follow with you.

## 2022-11-16 NOTE — Progress Notes (Signed)
Patient transported from cath lab back to room 2M15 on the ventilator with no problems.

## 2022-11-16 NOTE — Progress Notes (Signed)
ANTICOAGULATION CONSULT NOTE - Follow Up Consult  Pharmacy Consult for Heparin Indication: chest pain/ACS  Allergies  Allergen Reactions   Penicillin G Other (See Comments), Rash and Nausea And Vomiting    Sore throat   Penicillins Nausea And Vomiting, Rash and Other (See Comments)    Fever Has patient had a PCN reaction causing immediate rash, facial/tongue/throat swelling, SOB or lightheadedness with hypotension: Yes Has patient had a PCN reaction causing severe rash involving mucus membranes or skin necrosis: Unknown Has patient had a PCN reaction that required hospitalization: No Has patient had a PCN reaction occurring within the last 10 years: No If all of the above answers are "NO", then may proceed with Cephalosporin use.     Patient Measurements: Height: '5\' 3"'$  (160 cm) Weight: 44.5 kg (98 lb) IBW/kg (Calculated) : 52.4 Heparin Dosing Weight: 44.5 kg  Vital Signs: Temp: 99 F (37.2 C) (03/14 0645) Temp Source: Esophageal (03/14 0400) Pulse Rate: 70 (03/14 0645)  Labs: Recent Labs    11/14/22 2000 11/14/22 2002 11/14/22 2318 11/15/22 0103 11/15/22 0538 11/15/22 0846 11/15/22 1500 11/16/22 0318 11/16/22 0328 11/16/22 0516  HGB 11.8*   < >  --  12.6 12.2  --   --   --  11.2*  --   HCT 35.3*   < >  --  37.0 34.7*  --   --   --  32.1*  --   PLT 282  --   --   --  247  --   --   --  274  --   LABPROT 18.4*  --   --   --   --   --   --   --   --   --   INR 1.5*  --   --   --   --   --   --   --   --   --   HEPARINUNFRC  --   --   --   --   --  0.53  --  0.30  --   --   CREATININE 0.86  --   --   --  0.81  --  0.81  --  0.88  --   TROPONINIHS 318*  --  1,359*  --   --   --  411*  --   --  305*   < > = values in this interval not displayed.     Estimated Creatinine Clearance: 51.9 mL/min (by C-G formula based on SCr of 0.88 mg/dL).   Assessment: 52 yo female s/p Vfib arrest found to have elevated troponin (1,359) suspecting ACS. Pharmacy consulted to start  IV heparin. Pt is not on oral anticoagulation prior to admission. Daily heparin level came back at 0.30 which is therapeutic, but the low end of goal. Hgb 11.2 trending down. Plt WNL (274). Pt will go to Cath lab this AM.  Goal of Therapy:  Heparin level 0.3-0.7 units/ml Monitor platelets by anticoagulation protocol: Yes   Plan:  Continue IV heparin at 550 units/hour  F/u heparin plan after Cath results Monitor Daily CBC and heparin level Monitor for signs of bleeding   Tanja Port, Student Pharm-D 11/16/2022,7:36 AM

## 2022-11-16 NOTE — Progress Notes (Signed)
[facility] ADULT ICU REPLACEMENT PROTOCOL   The patient does apply for the [facility] Adult ICU Electrolyte Replacment Protocol based on the criteria listed below:   1.Exclusion criteria: TCTS, ECMO, Dialysis, and Myasthenia Gravis patients 2. Is GFR >/= 30 ml/min? Yes.    Patient's GFR today is >60 3. Is SCr </= 2? Yes.   Patient's SCr is 0.88 mg/dL 4. Did SCr increase >/= 0.5 in 24 hours? No. 5.Pt's weight >40kg  Yes.   6. Abnormal electrolyte(s): potassium 3.4  7. Electrolytes replaced per protocol 8.  Call MD STAT for K+ </= 2.5, Phos </= 1, or Mag </= 1 Physician:  protocol  Darlys Gales 11/16/2022 5:03 AM

## 2022-11-16 NOTE — Progress Notes (Signed)
NAME:  Cynthia Underwood, MRN:  [id number], DOB:  1971/03/10, LOS: 2 ADMISSION DATE:  11/11/2022 CONSULTATION DATE:  11/14/2022 REFERRING MD:  Wyline Copas - [facility] CHIEF COMPLAINT:  Vfib arrest on floor   History of Present Illness:  52 year old woman who initially presented to [facility] 3/9 for cough and progressive dyspnea x 4 days. Also reported diarrhea and 1 episode of epigastric pain with vomiting. PMHx significant for RBBB/high-grade heart block (s/p PPM), hypothyroidism, sickle cell trait, myotonic dystrophy (dx age 51), tobacco use.  Noted to be hypoxic in ED with O2 sat 88% on RA, CTA Chest c/f LLL PNA with mild bronchiectasis, negative for PE. Also c/f esophagitis. Patient was empirically started on CAP coverage (ceftriaxone, azithromycin) and GERD treatment (PPI/sucralfate). Also found to be positive for human metapneumovirus. Admitted to [facility].  Patient was initially progressing as expected 3/11-3/12 until ~1400, sat was noted to be in 80s. Placed on 5L humidified oxygen with slight improvement in sats. Solumedrol and DuoNebs were ordered. Around 1900 patient's RN was informed by telemetry of VF and was found unresponsive and pulseless. Code Blue was called with CPR x 15 minutes and ROSC. Amiodarone, Epi and defib were administered. Patient was intubated by anesthesia peri-code. IO was placed and Levophed was initiated. Cardiology was consulted.  PCCM was consulted for ICU admission post-arrest.  Pertinent Medical History:   Past Medical History:  Diagnosis Date   Bifascicular block 10/02/2014   Cataract    Dyspnea    Excess ear wax    Multinodular goiter    Pneumonia    Presence of permanent cardiac pacemaker    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   Sickle cell trait (HCC)    Watery eyes    Left   Weakness of both legs    Significant Hospital Events: Including procedures, antibiotic start and stop dates in addition to other pertinent events    3/9 - Presented to [facility] for LLL PNA. Started on Surveyor, quantity. +human metapneumovirus. 3/12 - Hypoxic requiring 5LNC. Later VF arrest on floor, CPR 3/13 echocardiogram LVEF severely depressed 50% (decreased compared with 01/2021), severely reduced RV function  Interim History / Subjective:   Is on low-dose norepinephrine Fentanyl and Precedex running Has had some chest discomfort and "fluttering" I/O+ 4.3 L total   Objective:  Blood pressure 130/81, pulse 70, temperature 99 F (37.2 C), resp. rate 19, height '5\' 3"'$  (1.6 m), weight 44.5 kg, last menstrual period 12/03/2016, SpO2 100 %. CVP:  [5 mmHg-8 mmHg] 5 mmHg  Vent Mode: PRVC FiO2 (%):  [40 %] 40 % Set Rate:  [16 bmp] 16 bmp Vt Set:  [420 mL] 420 mL PEEP:  [5 cmH20] 5 cmH20 Plateau Pressure:  [14 cmH20-19 cmH20] 16 cmH20   Intake/Output Summary (Last 24 hours) at 11/16/2022 0748 Last data filed at 11/16/2022 [id number] Gross per 24 hour  Intake 1436.23 ml  Output 1250 ml  Net 186.23 ml   Filed Weights   11/11/22 1211  Weight: 44.5 kg   Physical Examination: General: critical ill-appearing woman, sedated and ventilated HEENT: Pupils equal, ET tube in place Neuro: Sedated but will wake to voice, not questions, follows commands CV: Regular, distant, intermittently paced PULM: Clear bilaterally GI: Nondistended, positive bowel sounds Extremities: No edema Skin: No rash  Resolved Hospital Problem List:    Assessment & Plan:  Undifferentiated shock, ?septic vs. cardiogenic post-arrest.  Suspect in part due to need for sedation. -Wean norepinephrine as able. -Follow CVP, appears  to be euvolemic -Lactic acid is cleared -Following co-oximetry, 87% on 3/13 Cardiology evaluation ongoing as below  Post-Vfib arrest Troponinemia History of RBBB and PPM implantation New cardiomyopathy, LVEF 20% -Maintain normothermia -Continue heparin for now -Left and right heart catheterization planned for 3/14 -Optimize  electrolytes -Question whether she will need change over to an AICD  Acute hypoxemic respiratory failure LLL PNA +Human metapneumovirus infection -Continue PRVC 8 cc/kg, move to SBT as soon as feasible based on respiratory mechanics, mental status -Okay for PSV for comfort but no plans for extubation until after she has completed her cardiology procedures -Sedation as per PAD protocol, currently fentanyl and Precedex -Chest x-ray is cleared, completed azithromycin.  Will stop ceftriaxone after dose given 3/14 -Discontinue steroids 3/14 -Follow chest x-ray  Acute metabolic encephalopathy Multifactorial in the setting of post-arrest, metabolic derangements/acidosis, hypoxia, sedation. -Correct underlying metabolic derangements -PAD protocol, minimize sedation, Daily wake-up  Shock liver, improved Likely in the setting of VF arrest with transaminitis post-code. -Follow LFT, coags intermittently  Hyperglycemia No history of DM per family. -Sliding scale insulin ordered, CBGs -Goal CBG <180  GERD with esophagitis -Continue PPI  Hypothyroidism -Continue levothyroxine  Best Practice: (right click and "Reselect all SmartList Selections" daily)   Diet/type: NPO w/ meds via tube DVT prophylaxis: heparin gtt GI prophylaxis: PPI Lines: Central line and Arterial Line Foley:  Yes, and it is still needed Code Status:  full code Last date of multidisciplinary goals of care discussion [Pending - family updated at bedside daily, 3/14]  Labs:  CBC: Recent Labs  Lab 11/11/22 1238 11/11/22 2045 11/12/22 0352 11/14/22 0023 11/14/22 2000 11/14/22 2002 11/15/22 0103 11/15/22 0538 11/16/22 0328  WBC 5.9   < > 5.1 6.6 21.0*  --   --  17.3* 12.6*  NEUTROABS 4.7  --   --   --   --   --   --   --   --   HGB 11.8*   < > 10.2* 13.0 11.8* 11.9* 12.6 12.2 11.2*  HCT 36.3   < > 30.9* 40.4 35.3* 35.0* 37.0 34.7* 32.1*  MCV 101.4*   < > 96.9 97.3 97.8  --   --  93.3 92.2  PLT 226   < > 223  282 282  --   --  247 274   < > = values in this interval not displayed.   Basic Metabolic Panel: Recent Labs  Lab 11/12/22 0352 11/13/22 0215 11/14/22 0422 11/14/22 2000 11/14/22 2002 11/15/22 0103 11/15/22 0538 11/15/22 1500 11/16/22 0328  NA 143 143 139 139 141 137 139 141 140  K 3.4* 3.2* 4.0 3.2* 3.4* 4.5 4.6 4.0 3.4*  CL 106 111 105 111  --   --  107 107 109  CO2 '25 25 23 '$ 15*  --   --  20* 21* 21*  GLUCOSE 180* 91 77 211*  --   --  164* 143* 118*  BUN 12 10 5* <5*  --   --  '7 7 15  '$ CREATININE 0.63 0.74 0.75 0.86  --   --  0.81 0.81 0.88  CALCIUM 8.4* 8.5* 9.1 8.7*  --   --  9.0 8.9 8.4*  MG 2.3 2.2  --  1.7  --   --  2.7* 2.5* 2.2  PHOS 2.4*  --   --  5.0*  --   --  2.0* 4.1 3.0   GFR: Estimated Creatinine Clearance: 51.9 mL/min (by C-G formula based on SCr of 0.88 mg/dL).  Recent Labs  Lab 11/12/22 0352 11/14/22 0023 11/14/22 2000 11/14/22 2318 11/15/22 0538 11/16/22 0318 11/16/22 0328  PROCALCITON <0.10  --   --   --   --   --   --   WBC 5.1 6.6 21.0*  --  17.3*  --  12.6*  LATICACIDVEN  --   --   --  3.0* 1.3 1.0  --    Liver Function Tests: Recent Labs  Lab 11/14/22 0422 11/14/22 2000 11/15/22 0538 11/15/22 1500 11/16/22 0328  AST 18 254* 445* 234* 87*  ALT 12 230* 226* 187* 134*  ALKPHOS 37* 61 48 50 41  BILITOT 0.3 0.2* 0.4 0.4 0.7  PROT 6.5 5.2* 6.0* 6.0* 5.5*  ALBUMIN 3.1* 2.4* 2.7* 2.8* 2.5*   No results for input(s): "LIPASE", "AMYLASE" in the last 168 hours. No results for input(s): "AMMONIA" in the last 168 hours.  ABG:    Component Value Date/Time   PHART 7.332 (L) 11/15/2022 0103   PCO2ART 35.9 11/15/2022 0103   PO2ART 424 (H) 11/15/2022 0103   HCO3 19.3 (L) 11/15/2022 0103   TCO2 20 (L) 11/15/2022 0103   ACIDBASEDEF 6.0 (H) 11/15/2022 0103   O2SAT 67.8 11/16/2022 0325    Coagulation Profile: Recent Labs  Lab 11/14/22 2000  INR 1.5*    Cardiac Enzymes: No results for input(s): "CKTOTAL", "CKMB", "CKMBINDEX", "TROPONINI"  in the last 168 hours.  HbA1C: Hgb A1c MFr Bld  Date/Time Value Ref Range Status  11/15/2022 06:29 PM 5.6 4.8 - 5.6 % Final    Comment:    (NOTE)         Prediabetes: 5.7 - 6.4         Diabetes: >6.4         Glycemic control for adults with diabetes: <7.0   11/14/2022 08:00 PM 5.5 4.8 - 5.6 % Final    Comment:    (NOTE)         Prediabetes: 5.7 - 6.4         Diabetes: >6.4         Glycemic control for adults with diabetes: <7.0    CBG: Recent Labs  Lab 11/15/22 1108 11/15/22 1503 11/15/22 1947 11/15/22 2314 11/16/22 0312  GLUCAP 142* 190* 121* 116* 122*     Critical care time: 33 minutes   The patient is critically ill with multiple organ system failure and requires high complexity decision making for assessment and support, frequent evaluation and titration of therapies, advanced monitoring, review of radiographic studies and interpretation of complex data.   Critical Care Time devoted to patient care services, exclusive of separately billable procedures, described in this note is 33 minutes.   Baltazar Apo, MD, PhD 11/16/2022, 7:48 AM [facility] [phone] or if no answer before 7:00PM call [phone] For any issues after 7:00PM please call eLink [phone]

## 2022-11-16 NOTE — Interval H&P Note (Signed)
History and Physical Interval Note:  11/16/2022 9:31 AM  Cynthia Underwood  has presented today for surgery, with the diagnosis of vt arrest - heart failure.  The various methods of treatment have been discussed with the patient and family. After consideration of risks, benefits and other options for treatment, the patient has consented to  Procedure(s): RIGHT/LEFT HEART CATH AND CORONARY ANGIOGRAPHY (N/A) as a surgical intervention.  The patient's history has been reviewed, patient examined, no change in status, stable for surgery.  I have reviewed the patient's chart and labs.  Questions were answered to the patient's satisfaction.     Lizann Edelman [employer]

## 2022-11-17 ENCOUNTER — Ambulatory Visit: Payer: 59 | Admitting: Physical Therapy

## 2022-11-17 ENCOUNTER — Inpatient Hospital Stay (HOSPITAL_COMMUNITY): Payer: 59

## 2022-11-17 ENCOUNTER — Ambulatory Visit: Payer: 59 | Admitting: Occupational Therapy

## 2022-11-17 ENCOUNTER — Encounter (HOSPITAL_COMMUNITY): Payer: Self-pay | Admitting: Cardiology

## 2022-11-17 DIAGNOSIS — I4721 Torsades de pointes: Secondary | ICD-10-CM | POA: Diagnosis not present

## 2022-11-17 DIAGNOSIS — Z950 Presence of cardiac pacemaker: Secondary | ICD-10-CM | POA: Diagnosis not present

## 2022-11-17 DIAGNOSIS — I428 Other cardiomyopathies: Secondary | ICD-10-CM

## 2022-11-17 DIAGNOSIS — I442 Atrioventricular block, complete: Secondary | ICD-10-CM

## 2022-11-17 DIAGNOSIS — I429 Cardiomyopathy, unspecified: Secondary | ICD-10-CM | POA: Diagnosis not present

## 2022-11-17 DIAGNOSIS — J129 Viral pneumonia, unspecified: Secondary | ICD-10-CM

## 2022-11-17 DIAGNOSIS — I5021 Acute systolic (congestive) heart failure: Secondary | ICD-10-CM | POA: Diagnosis not present

## 2022-11-17 DIAGNOSIS — J189 Pneumonia, unspecified organism: Secondary | ICD-10-CM | POA: Diagnosis not present

## 2022-11-17 LAB — POCT I-STAT EG7
Acid-base deficit: 3 mmol/L — ABNORMAL HIGH (ref 0.0–2.0)
Bicarbonate: 21.9 mmol/L (ref 20.0–28.0)
Calcium, Ion: 1.19 mmol/L (ref 1.15–1.40)
HCT: 31 % — ABNORMAL LOW (ref 36.0–46.0)
Hemoglobin: 10.5 g/dL — ABNORMAL LOW (ref 12.0–15.0)
O2 Saturation: 80 %
Potassium: 3.8 mmol/L (ref 3.5–5.1)
Sodium: 144 mmol/L (ref 135–145)
TCO2: 23 mmol/L (ref 22–32)
pCO2, Ven: 36.3 mmHg — ABNORMAL LOW (ref 44–60)
pH, Ven: 7.389 (ref 7.25–7.43)
pO2, Ven: 44 mmHg (ref 32–45)

## 2022-11-17 LAB — POCT I-STAT 7, (LYTES, BLD GAS, ICA,H+H)
Acid-base deficit: 6 mmol/L — ABNORMAL HIGH (ref 0.0–2.0)
Bicarbonate: 21.4 mmol/L (ref 20.0–28.0)
Calcium, Ion: 1.28 mmol/L (ref 1.15–1.40)
HCT: 33 % — ABNORMAL LOW (ref 36.0–46.0)
Hemoglobin: 11.2 g/dL — ABNORMAL LOW (ref 12.0–15.0)
O2 Saturation: 94 %
Patient temperature: 98.8
Potassium: 3.2 mmol/L — ABNORMAL LOW (ref 3.5–5.1)
Sodium: 150 mmol/L — ABNORMAL HIGH (ref 135–145)
TCO2: 23 mmol/L (ref 22–32)
pCO2 arterial: 49.2 mmHg — ABNORMAL HIGH (ref 32–48)
pH, Arterial: 7.247 — ABNORMAL LOW (ref 7.35–7.45)
pO2, Arterial: 83 mmHg (ref 83–108)

## 2022-11-17 LAB — GLUCOSE, CAPILLARY
Glucose-Capillary: 121 mg/dL — ABNORMAL HIGH (ref 70–99)
Glucose-Capillary: 108 mg/dL — ABNORMAL HIGH (ref 70–99)
Glucose-Capillary: 133 mg/dL — ABNORMAL HIGH (ref 70–99)
Glucose-Capillary: 132 mg/dL — ABNORMAL HIGH (ref 70–99)
Glucose-Capillary: 158 mg/dL — ABNORMAL HIGH (ref 70–99)

## 2022-11-17 LAB — BASIC METABOLIC PANEL
Anion gap: 9 (ref 5–15)
BUN: 10 mg/dL (ref 6–20)
CO2: 21 mmol/L — ABNORMAL LOW (ref 22–32)
Calcium: 8.6 mg/dL — ABNORMAL LOW (ref 8.9–10.3)
Chloride: 114 mmol/L — ABNORMAL HIGH (ref 98–111)
Creatinine, Ser: 0.76 mg/dL (ref 0.44–1.00)
GFR, Estimated: >60 mL/min (ref >60)
Glucose, Bld: 113 mg/dL — ABNORMAL HIGH (ref 70–99)
Potassium: 2.9 mmol/L — ABNORMAL LOW (ref 3.5–5.1)
Sodium: 144 mmol/L (ref 135–145)

## 2022-11-17 LAB — COOXEMETRY PANEL
Carboxyhemoglobin: 1.4 % (ref 0.5–1.5)
Methemoglobin: <0.7 % (ref 0.0–1.5)
O2 Saturation: 63.8 %
Total hemoglobin: 10.6 g/dL — ABNORMAL LOW (ref 12.0–16.0)

## 2022-11-17 LAB — CBC
HCT: 30.9 % — ABNORMAL LOW (ref 36.0–46.0)
Hemoglobin: 10.6 g/dL — ABNORMAL LOW (ref 12.0–15.0)
MCH: 32.1 pg (ref 26.0–34.0)
MCHC: 34.3 g/dL (ref 30.0–36.0)
MCV: 93.6 fL (ref 80.0–100.0)
Platelets: 237 10*3/uL (ref 150–400)
RBC: 3.3 MIL/uL — ABNORMAL LOW (ref 3.87–5.11)
RDW: 15.7 % — ABNORMAL HIGH (ref 11.5–15.5)
WBC: 7.1 10*3/uL (ref 4.0–10.5)
nRBC: 0 % (ref 0.0–0.2)

## 2022-11-17 LAB — PHOSPHORUS: Phosphorus: 2.9 mg/dL (ref 2.5–4.6)

## 2022-11-17 LAB — HEPARIN LEVEL (UNFRACTIONATED): Heparin Unfractionated: <0.1 IU/mL — ABNORMAL LOW (ref 0.30–0.70)

## 2022-11-17 LAB — MAGNESIUM: Magnesium: 2.2 mg/dL (ref 1.7–2.4)

## 2022-11-17 LAB — BRAIN NATRIURETIC PEPTIDE: B Natriuretic Peptide: 145.2 pg/mL — ABNORMAL HIGH (ref 0.0–100.0)

## 2022-11-17 MED ORDER — FUROSEMIDE 10 MG/ML IJ SOLN
20.0000 mg | Freq: Two times a day (BID) | INTRAMUSCULAR | Status: AC
  Administered 2022-11-17: 20 mg via INTRAVENOUS
  Filled 2022-11-17: qty 2

## 2022-11-17 MED ORDER — POTASSIUM CHLORIDE 10 MEQ/50ML IV SOLN
10.0000 meq | INTRAVENOUS | Status: AC
  Administered 2022-11-17 (×4): 10 meq via INTRAVENOUS
  Filled 2022-11-17 (×4): qty 50

## 2022-11-17 MED ORDER — SPIRONOLACTONE 12.5 MG HALF TABLET
12.5000 mg | ORAL_TABLET | Freq: Every day | ORAL | Status: DC
  Administered 2022-11-17 – 2022-11-18 (×2): 12.5 mg
  Filled 2022-11-17 (×2): qty 1

## 2022-11-17 MED ORDER — ORAL CARE MOUTH RINSE: 15.0000 mL | OROMUCOSAL | Status: DC | PRN

## 2022-11-17 MED ORDER — POTASSIUM CHLORIDE 20 MEQ PO PACK
40.0000 meq | PACK | Freq: Once | ORAL | Status: AC
  Administered 2022-11-17: 40 meq
  Filled 2022-11-17: qty 2

## 2022-11-17 NOTE — Progress Notes (Signed)
[place] Progress Note Patient Name: Cynthia Underwood DOB: [birth year] MRN: [id number]   Date of Service  11/17/2022  HPI/Events of Note  ABG reviewed.  eICU Interventions  Respiratory rate increased to 20, ABG in AM.        Natia Fahmy [place] 11/17/2022, 11:50 PM

## 2022-11-17 NOTE — Progress Notes (Deleted)
[place] Progress Note Patient Name: Cynthia Underwood DOB: 1970-11-05 MRN: [id number]   Date of Service  11/17/2022  HPI/Events of Note  Temperature 100.6  eICU Interventions  PRN Tylenol ordered.        Frederik Pear 11/17/2022, 11:55 PM

## 2022-11-17 NOTE — Progress Notes (Addendum)
Advanced Heart Failure Rounding Note  PCP-Cardiologist: Fransico Him, MD   Subjective:    Cath yesterday c/w nonischemic cardiomyopathy, filling pressures minimally elevated on RHC. Excellent cardiac output by Fick.  Remains intubated. Weaning off sedation. Off NE.   Co-ox 64%. CVP 7.   No further VF.   LHC - normal cors RHC  Right Heart Pressures RHC Procedural Findings (on norepinephrine 2): Hemodynamics (mmHg) RA mean 9 RV 25/13 PA 25/14, mean 19 PCWP mean 9 LV 153/16 AO 159/79  Oxygen saturations: IVC 76% RA 80% RV 78% PA 80% AO 100%  Cardiac Output (Fick) 6.27  Cardiac Index (Fick) 4.37  Cardiac Output (thermo) 2.18 (??) Cardiac Index (thermo) 1.52 (??)    Objective:   Weight Range: 44.5 kg Body mass index is 17.36 kg/m.   Vital Signs:   Temp:  [96.8 F (36 C)-97.6 F (36.4 C)] 97.3 F (36.3 C) (03/15 0738) Pulse Rate:  [0-104] 60 (03/15 0830) Resp:  [13-30] 16 (03/15 0830) BP: (127-147)/(78-104) 130/104 (03/14 1041) SpO2:  [98 %-100 %] 100 % (03/15 0830) Arterial Line BP: (100-168)/(56-94) 118/71 (03/15 0830) FiO2 (%):  [40 %] 40 % (03/15 0800) Last BM Date : 11/15/22  Weight change: Filed Weights   11/11/22 1211  Weight: 44.5 kg    Intake/Output:   Intake/Output Summary (Last 24 hours) at 11/17/2022 0844 Last data filed at 11/17/2022 0800 Gross per 24 hour  Intake 1091.18 ml  Output 1950 ml  Net -858.82 ml      Physical Exam    CVP 7  General:  chronically ill appearing  HEENT: normal + ETT  Neck: supple. JVD 7 cm. Carotids 2+ bilat; no bruits. No lymphadenopathy or thyromegaly appreciated. Cor: PMI nondisplaced. Regular rate & rhythm. No rubs, gallops or murmurs. Lungs: clear Abdomen: soft, nontender, nondistended. No hepatosplenomegaly. No bruits or masses. Good bowel sounds. Extremities: no cyanosis, clubbing, rash, edema Neuro:intubated, weaning off sedation. Will nod head to respond    Telemetry   A-paced 60s.  No further VF  EKG    No new EKG to review   Labs    CBC Recent Labs    11/16/22 0328 11/16/22 1000 11/16/22 1020 11/17/22 0407  WBC 12.6*  --   --  7.1  HGB 11.2*   < > 10.5* 10.6*  HCT 32.1*   < > 31.0* 30.9*  MCV 92.2  --   --  93.6  PLT 274  --   --  237   < > = values in this interval not displayed.   Basic Metabolic Panel Recent Labs    11/16/22 0328 11/16/22 1000 11/16/22 1020 11/16/22 2130 11/17/22 0407  NA 140   < > 145  --  144  K 3.4*   < > 3.8  --  2.9*  CL 109  --   --   --  114*  CO2 21*  --   --   --  21*  GLUCOSE 118*  --   --   --  113*  BUN 15  --   --   --  10  CREATININE 0.88  --   --   --  0.76  CALCIUM 8.4*  --   --   --  8.6*  MG 2.2  --   --  2.1 2.2  PHOS 3.0  --   --  2.7 2.9   < > = values in this interval not displayed.   Liver Function Tests Recent Labs  11/15/22 1500 11/16/22 0328  AST 234* 87*  ALT 187* 134*  ALKPHOS 50 41  BILITOT 0.4 0.7  PROT 6.0* 5.5*  ALBUMIN 2.8* 2.5*   No results for input(s): "LIPASE", "AMYLASE" in the last 72 hours. Cardiac Enzymes No results for input(s): "CKTOTAL", "CKMB", "CKMBINDEX", "TROPONINI" in the last 72 hours.  BNP: BNP (last 3 results) Recent Labs    11/11/22 1238  BNP 58.5    ProBNP (last 3 results) No results for input(s): "PROBNP" in the last 8760 hours.   D-Dimer No results for input(s): "DDIMER" in the last 72 hours. Hemoglobin A1C Recent Labs    11/15/22 1829  HGBA1C 5.6   Fasting Lipid Panel No results for input(s): "CHOL", "HDL", "LDLCALC", "TRIG", "CHOLHDL", "LDLDIRECT" in the last 72 hours. Thyroid Function Tests No results for input(s): "TSH", "T4TOTAL", "T3FREE", "THYROIDAB" in the last 72 hours.  Invalid input(s): "FREET3"  Other results:   Imaging    CARDIAC CATHETERIZATION  Result Date: 11/16/2022 1. Normal coronaries, nonischemic cardiomyopathy. 2. Near-normal filling pressures and normal PA pressure. 3. Excellent cardiac output by Fick  with no step up in oxygen saturation to suggest a shunt lesion. 4. Marked difference between thermodilution CO and Fick CO, based on clinical picture suspect Fick is more accurate.     Medications:     Scheduled Medications:  aspirin  81 mg Per Tube Daily   Chlorhexidine Gluconate Cloth  6 each Topical Daily   enoxaparin (LOVENOX) injection  30 mg Subcutaneous Q24H   feeding supplement (PROSource TF20)  60 mL Per Tube Daily   insulin aspart  0-9 Units Subcutaneous Q4H   levothyroxine  112 mcg Per Tube QAC breakfast   mouth rinse  15 mL Mouth Rinse Q2H   pantoprazole (PROTONIX) IV  40 mg Intravenous QHS   sodium chloride flush  3 mL Intravenous Q12H   sucralfate  1 g Per Tube BID   thiamine  100 mg Per Tube Daily    Infusions:  sodium chloride     dexmedetomidine (PRECEDEX) IV infusion 1 mcg/kg/hr (11/17/22 0836)   feeding supplement (VITAL 1.5 CAL) 20 mL/hr at 11/17/22 0800   fentaNYL infusion INTRAVENOUS 200 mcg/hr (11/17/22 0800)   norepinephrine (LEVOPHED) Adult infusion 2 mcg/min (11/16/22 0846)   potassium chloride 50 mL/hr at 11/17/22 0800    PRN Medications: sodium chloride, acetaminophen, docusate, fentaNYL, guaiFENesin, ipratropium-albuterol, melatonin, midazolam, ondansetron (ZOFRAN) IV, mouth rinse, polyethylene glycol, sodium chloride flush    Patient Profile   52 y.o. female with a hx of tobacco use, COPD, myotonic dystrophy type I since age 12, high grade heart block s/p PPM, RBBB, sickle cell trait, and DM1 (questionable diagnosis) and strong family history of heart failure (father s/p cardiac transplant and paternal aunt w/ NICM w/ LVAD), admitted w/ acute hypoxic respiratory failure/ LLL CAP secondary to metapneumovirus. Hospital course c/b VF arrest. Post arrest echo w/ severe BiV failure, LVEF < 20%, RV severely reduced + diffuse HK   Assessment/Plan   1. Acute hypoxemic respiratory failure: Intubated post-VF arrest.  Had viral PNA with metapneumovirus at  presentation.  - Per CCM.   - On Solumedrol for bronchospasm.  2. High grade heart block: Has MDT PPM with His bundle lead.  This is likely due to cardiac involvement of myotonic dystrophy type 1.  3. VF arrest: 3/12, shocked. 15 minutes CPR total.  - Low threshold to restart amiodarone if further ectopy.  - LHC showed no CAD  - EP to see. Will need  ICD for secondary prevention 4. Acute systolic CHF: Prior workup for cardiac sarcoidosis. Cardiac MRI in 2017 showed subendocardial LGE in the mid inferior and inferolateral walls with EF 56%.  Cardiac PET done after this was NOT suggestive of cardiac sarcoidosis.   Echo in 5/22 with normal EF.  Echo this admission reviewed, EF 20-25% range with diffuse hypokinesis but relatively preserved function at the apex, moderate RV dysfunction.  Cause of cardiomyopathy is uncertain.  She is a smoker and noted to be hyperglycemic this admission.  There is also some regionality to her wall motion abnormalities on echo though not clear it is in a CAD pattern.  Cannot rule out ischemic cardiomyopathy.  Her complete heart block is most likely related to myotonic dystrophy diagnosis with cardiac involvement, therefore would not be surprised if her cardiomyopathy is also due to myotonic dystrophy.  She has an aunt with LVAD who has cardiomyopathy due to myotonic dystrophy.  Father had a heart transplant, uncertain of underlying condition. R/LHC c/w Nonischemic cardiomyopathy, suspect due to myotonic dystrophy type 1. Filling pressures minimally elevated on RHC. Excellent cardiac output by Fick and no evidence for shunt lesion. Co-ox 64% today. CVP 7.  - Gradual introduction of GDMT if BP remains stable off NE - She will need cardiac MRI repeated eventually.  - With VF arrest, she will need upgrade of device to ICD for secondary prevention once she is extubated and stable. EP following.  5. Elevated HS Trop: HS-TnI peaked at 1359 in setting of VF arrest then trended down.  No  prior chest pain. LHC showed no CAD.  6. Elevated LFTs: Trending down, suspect shock liver/hypoperfusion in setting of VF arrest.  7. Smoker: She will need to quit.  8. Sickle cell trait 9. Hyperglycemia: Patient does not carry the diagnosis of diabetes but blood glucose has been high in the hospital, using insulin.   - Hgb A1c 5.5.  10. Myotonic dystrophy: Type 1, diagnosed at age 22. Has family members with the same diagnosis.  Followed by neurology.   Length of Stay: [address] PA-C  11/17/2022, 8:44 AM  Advanced Heart Failure Team Pager [phone] (M-F; 7a - 5p)  Please contact [facility] for night-coverage after hours (5p -7a ) and weekends on [email or web address]  Patient seen with PA, agree with the above note.   She is off NE with stable to elevated BP.  Co-ox 64% with CVP 7.  She is a-paced today.   Weaning of vent has been limited by agitation/tachycardia.   General: NAD, intubated.  Neck: No JVD, no thyromegaly or thyroid nodule.  Lungs: Decreased at bases.  CV: Nondisplaced PMI.  Heart regular S1/S2, no S3/S4, no murmur.  No peripheral edema.   Abdomen: Soft, nontender, no hepatosplenomegaly, no distention.  Skin: Intact without lesions or rashes.  Neurologic: Follows commands.  Extremities: No clubbing or cyanosis.  HEENT: Normal.   Suspect ready for extubation soon, per CCM.     Nonischemic cardiomyopathy, suspect due to myotonic dystrophy type 1.  Filling pressures minimally elevated on yesterday.  Excellent cardiac output by Fick and no evidence for shunt lesion.  CI low by thermodilution.  Clinically, think Fick is more likely to be accurate. Today, co-ox 64% with CVP 7.  She is now off norepinephrine.  - Can add spironolactone 12.5 - Gradual introduction of GDMT as tolerated.  - She will need cardiac MRI repeated eventually if her current device is compatible.  - No further ventricular arrhythmias.  With VF arrest, she will need upgrade of device to ICD for  secondary prevention once she is extubated and stable.  Has MDT PPM with His lead, QRS noted to be very wide when v-paced.  ?Transition to left bundle or CS lead if she has a high v-pacing burden.  EP will need to be consulted when she is extubated.  CRITICAL CARE Performed by: Loralie Champagne  Total critical care time: 35 minutes  Critical care time was exclusive of separately billable procedures and treating other patients.  Critical care was necessary to treat or prevent imminent or life-threatening deterioration.  Critical care was time spent personally by me on the following activities: development of treatment plan with patient and/or surrogate as well as nursing, discussions with consultants, evaluation of patient's response to treatment, examination of patient, obtaining history from patient or surrogate, ordering and performing treatments and interventions, ordering and review of laboratory studies, ordering and review of radiographic studies, pulse oximetry and re-evaluation of patient's condition.  Loralie Champagne 11/17/2022 9:28 AM

## 2022-11-17 NOTE — Progress Notes (Addendum)
NAME:  Cynthia Underwood, MRN:  [id number], DOB:  1970/09/29, LOS: 3 ADMISSION DATE:  11/11/2022 CONSULTATION DATE:  11/14/2022 REFERRING MD:  Wyline Copas - [facility] CHIEF COMPLAINT:  Vfib arrest on floor   History of Present Illness:   52 year old woman who initially presented to [facility] 3/9 for cough and progressive dyspnea x 4 days. Also reported diarrhea and 1 episode of epigastric pain with vomiting. PMHx significant for RBBB/high-grade heart block (s/p PPM), hypothyroidism, sickle cell trait, myotonic dystrophy (dx age 52), tobacco use.  Noted to be hypoxic in ED with O2 sat 88% on RA, CTA Chest c/f LLL PNA with mild bronchiectasis, negative for PE. Also c/f esophagitis. Patient was empirically started on CAP coverage (ceftriaxone, azithromycin) and GERD treatment (PPI/sucralfate). Also found to be positive for human metapneumovirus. Admitted to [facility].  Patient was initially progressing as expected 3/11-3/12 until ~1400, sat was noted to be in 80s. Placed on 5L humidified oxygen with slight improvement in sats. Solumedrol and DuoNebs were ordered. Around 1900 patient's RN was informed by telemetry of VF and was found unresponsive and pulseless. Code Blue was called with CPR x 15 minutes and ROSC. Amiodarone, Epi and defib were administered. Patient was intubated by anesthesia peri-code. IO was placed and Levophed was initiated. Cardiology was consulted.  PCCM was consulted for ICU admission post-arrest.  Pertinent Medical History:   Past Medical History:  Diagnosis Date   Bifascicular block 10/02/2014   Cataract    Dyspnea    Excess ear wax    Multinodular goiter    Pneumonia    Presence of permanent cardiac pacemaker    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   Sickle cell trait (HCC)    Watery eyes    Left   Weakness of both legs    Significant Hospital Events: Including procedures, antibiotic start and stop dates in addition to other pertinent events    3/9 - Presented to [facility] for LLL PNA. Started on Surveyor, quantity. +human metapneumovirus. 3/12 - Hypoxic requiring 5LNC. Later VF arrest on floor, CPR 3/13 echocardiogram LVEF severely depressed 50% (decreased compared with 01/2021), severely reduced RV function  Interim History / Subjective:   Patient is levophed at 1.  Remains on the ventilator Cardiac catheterization normal coronaries, new normal filling pressures and normal PA pressure.  With excellent cardiac output  Objective:  Blood pressure (!) 130/104, pulse 60, temperature 97.6 F (36.4 C), temperature source Oral, resp. rate 16, height 5\' 3"  (1.6 m), weight 44.5 kg, last menstrual period 12/03/2016, SpO2 100 %. CVP:  [6 mmHg-7 mmHg] 7 mmHg  Vent Mode: PRVC FiO2 (%):  [40 %] 40 % Set Rate:  [16 bmp] 16 bmp Vt Set:  [420 mL] 420 mL PEEP:  [5 cmH20] 5 cmH20 Plateau Pressure:  [17 cmH20] 17 cmH20   Intake/Output Summary (Last 24 hours) at 11/17/2022 0726 Last data filed at 11/17/2022 0616 Gross per 24 hour  Intake 883.93 ml  Output 1950 ml  Net -1066.07 ml   Filed Weights   11/11/22 1211  Weight: 44.5 kg   Physical Examination: Blood pressure (!) 130/104, pulse 60, temperature (!) 97.3 F (36.3 C), temperature source Axillary, resp. rate 16, height 5\' 3"  (1.6 m), weight 44.5 kg, last menstrual period 12/03/2016, SpO2 100 %. Gen:      No acute distress HEENT:  EOMI, sclera anicteric Neck:     No masses; no thyromegaly Lungs:    Clear to auscultation bilaterally; normal respiratory effort  CV:         Regular rate and rhythm; no murmurs Abd:      + bowel sounds; soft, non-tender; no palpable masses, no distension Ext:    No edema; adequate peripheral perfusion Skin:      Warm and dry; no rash Neuro: Sedated  Labs/imaging reviewed Significant for potassium 2.9, BUN/creatinine 10/0.76 WBC 7.1, hemoglobin 10.6, platelets 237 No new imaging  Resolved Hospital Problem List:    Assessment & Plan:  Undifferentiated  shock, ?septic vs. cardiogenic post-arrest.  Suspect in part due to need for sedation. Wean off pressors Continue telemonitoring.   Post-Vfib arrest Troponinemia History of RBBB and PPM implantation New cardiomyopathy, LVEF 20% Cardiac cath shows nonischemic cardiomyopathy with clean coronaries, normal filling pressures and no evidence of pulmonary hypertension Cardiology is following Will need AICD upgrade before discharge  Acute hypoxemic respiratory failure LLL PNA +Human metapneumovirus infection Start weaning trials as tolerated Agitation is a barrier to weaning. Increase precedex ceiling to 1.8 Versed PRN Completed azithromycin and ceftriaxone Steroids discontinued 3/14 Follow intermittent chest x-ray  Acute metabolic encephalopathy Multifactorial in the setting of post-arrest, metabolic derangements/acidosis, hypoxia, sedation. Correct underlying metabolic derangements PAD protocol, minimize sedation, Daily wake-up  Shock liver, improved Likely in the setting of VF arrest with transaminitis post-code. -Follow LFT, coags intermittently  Hyperglycemia No history of DM per family. -Sliding scale insulin ordered, CBGs -Goal CBG <180  GERD with esophagitis -Continue PPI  Hypothyroidism -Continue levothyroxine  Best Practice: (right click and "Reselect all SmartList Selections" daily)   Diet/type: NPO w/ meds via tube DVT prophylaxis: heparin gtt GI prophylaxis: PPI Lines: Central line and Arterial Line Foley:  Yes, and it is still needed Code Status:  full code Last date of multidisciplinary goals of care discussion [Pending - family updated at bedside daily, 3/14]  Critical care time:    The patient is critically ill with multiple organ system failure and requires high complexity decision making for assessment and support, frequent evaluation and titration of therapies, advanced monitoring, review of radiographic studies and interpretation of complex data.    Critical Care Time devoted to patient care services, exclusive of separately billable procedures, described in this note is 35 minutes.   Marshell Garfinkel MD [facility] See Amion for pager  If no response to pager , please call [phone] until 7pm After 7:00 pm call Elink  [phone] 11/17/2022, 8:09 AM

## 2022-11-17 NOTE — Progress Notes (Signed)
eLink Physician-Brief Progress Note Patient Name: ALEXX BARMANN DOB: Mar 16, 1971 MRN: [id number]   Date of Service  11/17/2022  HPI/Events of Note  Patient extubated earlier today, now with desaturation, increasing oxygen requirements, and somnolence. She is on 0.2 mcg of Precedex for anxiety. Per bedside RN patient is + 3 liters fluid overall.  eICU Interventions  Precedex reduced to 0.2 mcg, BIPAP ordered, stat portable CXR, BNP, Lasix 20 mg iv x 1.        Frederik Pear 11/17/2022, 8:49 PM

## 2022-11-17 NOTE — Progress Notes (Signed)
[facility] ADULT ICU REPLACEMENT PROTOCOL   The patient does apply for the [facility] Adult ICU Electrolyte Replacment Protocol based on the criteria listed below:   1.Exclusion criteria: TCTS, ECMO, Dialysis, and Myasthenia Gravis patients 2. Is GFR >/= 30 ml/min? Yes.    Patient's GFR today is >60 3. Is SCr </= 2? Yes.   Patient's SCr is 0.76 mg/dL 4. Did SCr increase >/= 0.5 in 24 hours? No. 5.Pt's weight >40kg  Yes.   6. Abnormal electrolyte(s): potassium 2.9  7. Electrolytes replaced per protocol 8.  Call MD STAT for K+ </= 2.5, Phos </= 1, or Mag </= 1 Physician:  protocol  Darlys Gales 11/17/2022 5:29 AM

## 2022-11-17 NOTE — Progress Notes (Signed)
PCCM Interval Note  S: Extubated 1640, has been tolerating 2-3 L/min Increased work of breathing, tachycardia, hypoxemia this evening.  Developing somnolence on Precedex 0.2 BiPAP initiated 2050 Lasix 20 mg IV given x 1  Vitals:   11/17/22 1700 11/17/22 1800 11/17/22 1919 11/17/22 2027  BP:      Pulse: 89 (!) 105 93   Resp: 14 (!) 7 15   Temp:    97.8 F (36.6 C)  TempSrc:    Oral  SpO2: 93% 93% (!) 89%   Weight:      Height:      Thin ill-appearing woman laying in bed at 45 degrees with BiPAP in place Oropharynx clear, BiPAP with minimal leak, NG tube in place Lungs clear superiorly, decreased at bases Heart regular, tachycardic 105  Chest x-ray 2050 reviewed by me shows increased right basilar atelectasis, no infiltrate on the left  Impression: Hypoxemic respiratory failure in patient with nonischemic cardiomyopathy, recent metapneumovirus infection, originally admitted post VF arrest.  Hypoxemia appears to be due to evolving right basilar atelectasis postextubation  -Continue BiPAP for now, attempted to optimize mask fit and pressures -Check ABG in 30-45 minutes -If evidence for evolving hypercapnia, fatigue, ineffective support with BiPAP then we will ET intubate  Critical care time 30 minutes   Baltazar Apo, MD, PhD 11/17/2022, 9:35 PM [facility] [phone] or if no answer before 7:00PM call [phone] For any issues after 7:00PM please call eLink [phone]

## 2022-11-17 NOTE — Procedures (Signed)
Extubation Procedure Note  Patient Details:   Name: BAILY KNOPS DOB: 1971-05-28 MRN: [id number]   Airway Documentation:    Vent end date: 11/17/22 Vent end time: 1640   Evaluation  O2 sats: stable throughout Complications: No apparent complications Patient did tolerate procedure well. Bilateral Breath Sounds: Clear, Diminished   Yes  Pt extubated to 2L [place], pt tolerating well at this time. No stridor noted, Cuff leak present, RN at bedside, RT will monitor.   Nani Ravens 11/17/2022, 4:40 PM

## 2022-11-17 NOTE — Progress Notes (Signed)
PT Cancellation Note  Patient Details Name: Cynthia Underwood MRN: [id number] DOB: 1970/09/12   Cancelled Treatment:    Reason Eval/Treat Not Completed: Medical issues which prohibited therapy - pt remains intubated, weaning sedation but lethargic at this time. Per rehab protocol, PT to sign off after x3 consecutive cancels, please reconsult as medically appropriate.   Stacie Glaze, PT DPT Acute Rehabilitation Services Pager [phone]  Office [phone]    [name] 11/17/2022, 1:56 PM

## 2022-11-18 ENCOUNTER — Inpatient Hospital Stay (HOSPITAL_COMMUNITY): Payer: 59

## 2022-11-18 DIAGNOSIS — J189 Pneumonia, unspecified organism: Secondary | ICD-10-CM | POA: Diagnosis not present

## 2022-11-18 DIAGNOSIS — J9601 Acute respiratory failure with hypoxia: Secondary | ICD-10-CM | POA: Diagnosis not present

## 2022-11-18 DIAGNOSIS — I4721 Torsades de pointes: Secondary | ICD-10-CM | POA: Diagnosis not present

## 2022-11-18 DIAGNOSIS — I5021 Acute systolic (congestive) heart failure: Secondary | ICD-10-CM | POA: Diagnosis not present

## 2022-11-18 DIAGNOSIS — I469 Cardiac arrest, cause unspecified: Secondary | ICD-10-CM | POA: Diagnosis not present

## 2022-11-18 LAB — CBC
HCT: 31.2 % — ABNORMAL LOW (ref 36.0–46.0)
Hemoglobin: 10.2 g/dL — ABNORMAL LOW (ref 12.0–15.0)
MCH: 32.2 pg (ref 26.0–34.0)
MCHC: 32.7 g/dL (ref 30.0–36.0)
MCV: 98.4 fL (ref 80.0–100.0)
Platelets: 208 10*3/uL (ref 150–400)
RBC: 3.17 MIL/uL — ABNORMAL LOW (ref 3.87–5.11)
RDW: 16.1 % — ABNORMAL HIGH (ref 11.5–15.5)
WBC: 8.7 10*3/uL (ref 4.0–10.5)
nRBC: 0 % (ref 0.0–0.2)

## 2022-11-18 LAB — POCT I-STAT 7, (LYTES, BLD GAS, ICA,H+H)
Acid-Base Excess: 1 mmol/L (ref 0.0–2.0)
Acid-base deficit: 2 mmol/L (ref 0.0–2.0)
Bicarbonate: 23 mmol/L (ref 20.0–28.0)
Bicarbonate: 26.9 mmol/L (ref 20.0–28.0)
Calcium, Ion: 1.31 mmol/L (ref 1.15–1.40)
Calcium, Ion: 1.31 mmol/L (ref 1.15–1.40)
HCT: 29 % — ABNORMAL LOW (ref 36.0–46.0)
HCT: 32 % — ABNORMAL LOW (ref 36.0–46.0)
Hemoglobin: 9.9 g/dL — ABNORMAL LOW (ref 12.0–15.0)
Hemoglobin: 10.9 g/dL — ABNORMAL LOW (ref 12.0–15.0)
O2 Saturation: 96 %
O2 Saturation: 75 %
Patient temperature: 98.1
Patient temperature: 97.5
Potassium: 3.3 mmol/L — ABNORMAL LOW (ref 3.5–5.1)
Potassium: 3.2 mmol/L — ABNORMAL LOW (ref 3.5–5.1)
Sodium: 149 mmol/L — ABNORMAL HIGH (ref 135–145)
Sodium: 149 mmol/L — ABNORMAL HIGH (ref 135–145)
TCO2: 24 mmol/L (ref 22–32)
TCO2: 28 mmol/L (ref 22–32)
pCO2 arterial: 38.1 mmHg (ref 32–48)
pCO2 arterial: 43.9 mmHg (ref 32–48)
pH, Arterial: 7.387 (ref 7.35–7.45)
pH, Arterial: 7.393 (ref 7.35–7.45)
pO2, Arterial: 84 mmHg (ref 83–108)
pO2, Arterial: 39 mmHg — CL (ref 83–108)

## 2022-11-18 LAB — GLUCOSE, CAPILLARY
Glucose-Capillary: 126 mg/dL — ABNORMAL HIGH (ref 70–99)
Glucose-Capillary: 129 mg/dL — ABNORMAL HIGH (ref 70–99)
Glucose-Capillary: 130 mg/dL — ABNORMAL HIGH (ref 70–99)
Glucose-Capillary: 130 mg/dL — ABNORMAL HIGH (ref 70–99)
Glucose-Capillary: 152 mg/dL — ABNORMAL HIGH (ref 70–99)
Glucose-Capillary: 189 mg/dL — ABNORMAL HIGH (ref 70–99)
Glucose-Capillary: 246 mg/dL — ABNORMAL HIGH (ref 70–99)

## 2022-11-18 LAB — PROTIME-INR
INR: 1.4 — ABNORMAL HIGH (ref 0.8–1.2)
Prothrombin Time: 17.3 seconds — ABNORMAL HIGH (ref 11.4–15.2)

## 2022-11-18 LAB — COOXEMETRY PANEL
Carboxyhemoglobin: 0.8 % (ref 0.5–1.5)
Methemoglobin: 0.8 % (ref 0.0–1.5)
O2 Saturation: 99.1 %
Total hemoglobin: 10.6 g/dL — ABNORMAL LOW (ref 12.0–16.0)

## 2022-11-18 LAB — COMPREHENSIVE METABOLIC PANEL
ALT: 60 U/L — ABNORMAL HIGH (ref 0–44)
AST: 20 U/L (ref 15–41)
Albumin: 2.4 g/dL — ABNORMAL LOW (ref 3.5–5.0)
Alkaline Phosphatase: 37 U/L — ABNORMAL LOW (ref 38–126)
Anion gap: 9 (ref 5–15)
BUN: 17 mg/dL (ref 6–20)
CO2: 23 mmol/L (ref 22–32)
Calcium: 8.8 mg/dL — ABNORMAL LOW (ref 8.9–10.3)
Chloride: 115 mmol/L — ABNORMAL HIGH (ref 98–111)
Creatinine, Ser: 1 mg/dL (ref 0.44–1.00)
GFR, Estimated: >60 mL/min (ref >60)
Glucose, Bld: 129 mg/dL — ABNORMAL HIGH (ref 70–99)
Potassium: 3.1 mmol/L — ABNORMAL LOW (ref 3.5–5.1)
Sodium: 147 mmol/L — ABNORMAL HIGH (ref 135–145)
Total Bilirubin: 0.5 mg/dL (ref 0.3–1.2)
Total Protein: 5.5 g/dL — ABNORMAL LOW (ref 6.5–8.1)

## 2022-11-18 LAB — BLOOD GAS, ARTERIAL
Acid-Base Excess: 3.2 mmol/L — ABNORMAL HIGH (ref 0.0–2.0)
Bicarbonate: 26.9 mmol/L (ref 20.0–28.0)
Drawn by: 55062
O2 Saturation: 100 %
Patient temperature: 36.4
pCO2 arterial: 36 mmHg (ref 32–48)
pH, Arterial: 7.48 — ABNORMAL HIGH (ref 7.35–7.45)
pO2, Arterial: 137 mmHg — ABNORMAL HIGH (ref 83–108)

## 2022-11-18 LAB — LIPOPROTEIN A (LPA): Lipoprotein (a): 36.2 nmol/L — ABNORMAL HIGH (ref <75.0)

## 2022-11-18 MED ORDER — ORAL CARE MOUTH RINSE: 15.0000 mL | OROMUCOSAL | Status: DC | PRN

## 2022-11-18 MED ORDER — NOREPINEPHRINE 4 MG/250ML-% IV SOLN
INTRAVENOUS | Status: AC
  Administered 2022-11-18: 12 ug/min via INTRAVENOUS
  Filled 2022-11-18: qty 250

## 2022-11-18 MED ORDER — FENTANYL CITRATE PF 50 MCG/ML IJ SOSY: 25.0000 ug | PREFILLED_SYRINGE | INTRAMUSCULAR | Status: DC | PRN

## 2022-11-18 MED ORDER — FENTANYL CITRATE PF 50 MCG/ML IJ SOSY
PREFILLED_SYRINGE | INTRAMUSCULAR | Status: AC
  Administered 2022-11-18: 100 ug via INTRAVENOUS
  Filled 2022-11-18: qty 2

## 2022-11-18 MED ORDER — POTASSIUM CHLORIDE 10 MEQ/50ML IV SOLN
10.0000 meq | INTRAVENOUS | Status: AC
  Administered 2022-11-18 (×6): 10 meq via INTRAVENOUS
  Filled 2022-11-18 (×6): qty 50

## 2022-11-18 MED ORDER — FENTANYL CITRATE PF 50 MCG/ML IJ SOSY
50.0000 ug | PREFILLED_SYRINGE | INTRAMUSCULAR | Status: DC | PRN
  Administered 2022-11-20: 50 ug via INTRAVENOUS

## 2022-11-18 MED ORDER — FUROSEMIDE 10 MG/ML IJ SOLN
20.0000 mg | Freq: Two times a day (BID) | INTRAMUSCULAR | Status: AC
  Administered 2022-11-18: 20 mg via INTRAVENOUS
  Filled 2022-11-18: qty 2

## 2022-11-18 MED ORDER — FENTANYL CITRATE PF 50 MCG/ML IJ SOSY
50.0000 ug | PREFILLED_SYRINGE | INTRAMUSCULAR | Status: DC | PRN
  Administered 2022-11-18: 100 ug via INTRAVENOUS
  Administered 2022-11-18 – 2022-11-19 (×4): 200 ug via INTRAVENOUS
  Filled 2022-11-18: qty 2
  Filled 2022-11-18 (×4): qty 4

## 2022-11-18 MED ORDER — NOREPINEPHRINE 4 MG/250ML-% IV SOLN
0.0000 ug/min | INTRAVENOUS | Status: DC
  Administered 2022-11-18: 14 ug/min via INTRAVENOUS
  Administered 2022-11-19: 10 ug/min via INTRAVENOUS
  Filled 2022-11-18 (×2): qty 250

## 2022-11-18 MED ORDER — ORAL CARE MOUTH RINSE
15.0000 mL | OROMUCOSAL | Status: DC
  Administered 2022-11-18 – 2022-11-25 (×73): 15 mL via OROMUCOSAL

## 2022-11-18 MED ORDER — MIDAZOLAM HCL 2 MG/2ML IJ SOLN: 2.0000 mg | Freq: Once | INTRAMUSCULAR | Status: AC

## 2022-11-18 MED ORDER — ETOMIDATE 2 MG/ML IV SOLN
INTRAVENOUS | Status: AC
  Administered 2022-11-18: 20 mg via INTRAVENOUS
  Filled 2022-11-18: qty 20

## 2022-11-18 MED ORDER — MIDAZOLAM HCL 2 MG/2ML IJ SOLN
INTRAMUSCULAR | Status: AC
  Administered 2022-11-18: 2 mg via INTRAVENOUS
  Filled 2022-11-18: qty 2

## 2022-11-18 MED ORDER — SENNA 8.6 MG PO TABS
1.0000 | ORAL_TABLET | Freq: Every day | ORAL | Status: DC
  Administered 2022-11-20: 8.6 mg
  Filled 2022-11-18 (×2): qty 1

## 2022-11-18 MED ORDER — ETOMIDATE 2 MG/ML IV SOLN: 20.0000 mg | Freq: Once | INTRAVENOUS | Status: AC

## 2022-11-18 MED ORDER — FENTANYL CITRATE PF 50 MCG/ML IJ SOSY: 100.0000 ug | PREFILLED_SYRINGE | Freq: Once | INTRAMUSCULAR | Status: AC

## 2022-11-18 MED ORDER — POLYETHYLENE GLYCOL 3350 17 G PO PACK
17.0000 g | PACK | Freq: Every day | ORAL | Status: DC
  Administered 2022-11-18 – 2022-11-19 (×2): 17 g
  Filled 2022-11-18 (×2): qty 1

## 2022-11-18 NOTE — Procedures (Signed)
Intubation Procedure Note  Cynthia Underwood  [id number]  Sep 30, 1970  Date:11/18/22  Time:7:51 PM   Provider Performing:Kinney Sackmann V. Whitney Hillegass    Procedure: Intubation (31500)  Indication(s) Respiratory Failure  Consent Risks of the procedure as well as the alternatives and risks of each were explained to the patient and/or caregiver.  Consent for the procedure was obtained and is signed in the bedside chart   Anesthesia Etomidate, Versed, and Fentanyl   Time Out Verified patient identification, verified procedure, site/side was marked, verified correct patient position, special equipment/implants available, medications/allergies/relevant history reviewed, required imaging and test results available.   Sterile Technique Usual hand hygeine, masks, and gloves were used   Procedure Description Patient positioned in bed supine.  Sedation given as noted above.  Patient was intubated with endotracheal tube using Glidescope.  View was Grade 1 full glottis .  Number of attempts was 1.  Colorimetric CO2 detector was consistent with tracheal placement.   Complications/Tolerance None; patient tolerated the procedure well. Chest X-ray is ordered to verify placement.   EBL Minimal   Specimen(s) None  Cynthia Diana V. Cynthia Soho MD

## 2022-11-18 NOTE — Progress Notes (Addendum)
NAME:  Cynthia Underwood, MRN:  [id number], DOB:  [birth year], LOS: 4 ADMISSION DATE:  11/11/2022 CONSULTATION DATE:  11/14/2022 REFERRING MD:  Wyline Copas - [facility] CHIEF COMPLAINT:  Vfib arrest on floor   History of Present Illness:   52 year old woman who initially presented to [facility] 3/9 for cough and progressive dyspnea x 4 days. Also reported diarrhea and 1 episode of epigastric pain with vomiting. PMHx significant for RBBB/high-grade heart block (s/p PPM), hypothyroidism, sickle cell trait, myotonic dystrophy (dx age 68), tobacco use.  Noted to be hypoxic in ED with O2 sat 88% on RA, CTA Chest c/f LLL PNA with mild bronchiectasis, negative for PE. Also c/f esophagitis. Patient was empirically started on CAP coverage (ceftriaxone, azithromycin) and GERD treatment (PPI/sucralfate). Also found to be positive for human metapneumovirus. Admitted to [facility].  Patient was initially progressing as expected 3/11-3/12 until ~1400, sat was noted to be in 80s. Placed on 5L humidified oxygen with slight improvement in sats. Solumedrol and DuoNebs were ordered. Around 1900 patient's RN was informed by telemetry of VF and was found unresponsive and pulseless. Code Blue was called with CPR x 15 minutes and ROSC. Amiodarone, Epi and defib were administered. Patient was intubated by anesthesia peri-code. IO was placed and Levophed was initiated. Cardiology was consulted.  PCCM was consulted for ICU admission post-arrest.  Pertinent Medical History:   Past Medical History:  Diagnosis Date   Bifascicular block 10/02/2014   Cataract    Dyspnea    Excess ear wax    Multinodular goiter    Pneumonia    Presence of permanent cardiac pacemaker    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   Sickle cell trait (HCC)    Watery eyes    Left   Weakness of both legs    Significant Hospital Events: Including procedures, antibiotic start and stop dates in addition to other pertinent events    3/9 - Presented to [facility] for LLL PNA. Started on Surveyor, quantity. +human metapneumovirus. 3/12 - Hypoxic requiring 5LNC. Later VF arrest on floor, CPR 3/13 echocardiogram LVEF severely depressed 50% (decreased compared with 01/2021), severely reduced RV function 3/14 Cardiac catheterization showed normal coronaries, new normal filling pressures and normal PA pressure.  With excellent cardiac output  Interim History / Subjective:   Extubated yesterday.  Overnight she developed increased respiratory distress, hypoxia and was placed on BiPAP Lasix given for volume overload.  Objective:  Blood pressure (!) 130/104, pulse 89, temperature 98.1 F (36.7 C), temperature source Axillary, resp. rate 20, height 5\' 3"  (1.6 m), weight 45.2 kg, last menstrual period 12/03/2016, SpO2 99 %. CVP:  [7 mmHg-8 mmHg] 7 mmHg  Vent Mode: BIPAP;PCV FiO2 (%):  [40 %-100 %] 80 % Set Rate:  [15 bmp-20 bmp] 20 bmp PEEP:  [5 cmH20-8 cmH20] 8 cmH20 Pressure Support:  [12 cmH20] 12 cmH20   Intake/Output Summary (Last 24 hours) at 11/18/2022 0828 Last data filed at 11/18/2022 0600 Gross per 24 hour  Intake 393.67 ml  Output 1450 ml  Net -1056.33 ml   Filed Weights   11/11/22 1211 11/18/22 0500  Weight: 44.5 kg 45.2 kg   Physical Examination: Blood pressure (!) 130/104, pulse 89, temperature 98.1 F (36.7 C), temperature source Axillary, resp. rate 20, height 5\' 3"  (1.6 m), weight 45.2 kg, last menstrual period 12/03/2016, SpO2 99 %. Gen:      No acute distress HEENT:  EOMI, sclera anicteric Neck:     No masses; no thyromegaly  Lungs:    Clear to auscultation bilaterally; normal respiratory effort CV:         Regular rate and rhythm; no murmurs Abd:      + bowel sounds; soft, non-tender; no palpable masses, no distension Ext:    No edema; adequate peripheral perfusion Skin:      Warm and dry; no rash Neuro: alert and oriented x 3 Psych: normal mood and affect   Labs/imaging reviewed Significant for  sodium 147, potassium 3.1 BUN/creatinine 17/1 AST 20, ALT 60 X-ray with right basilar atelectasis.  Resolved Hospital Problem List:    Assessment & Plan:  Undifferentiated shock, ?septic vs. cardiogenic post-arrest.  Suspect in part due to need for sedation. Stable off pressors.  Continue A-line for now Continue telemonitoring.  Post-Vfib arrest Troponinemia History of RBBB and PPM implantation New cardiomyopathy, LVEF 20% Cardiac cath shows nonischemic cardiomyopathy with clean coronaries, normal filling pressures and no evidence of pulmonary hypertension Cardiology and EP are following  Acute hypoxemic respiratory failure LLL PNA +Human metapneumovirus infection Suspect she may have some respiratory muscle weakness from myotonic dystrophy.  Will continue BiPAP as needed and at night Completed azithromycin and ceftriaxone Steroids discontinued 3/14 Follow intermittent chest x-ray  Acute metabolic encephalopathy Multifactorial in the setting of post-arrest, metabolic derangements/acidosis, hypoxia, sedation. Correct underlying metabolic derangements  Shock liver, improved Likely in the setting of VF arrest with transaminitis post-code. Follow LFT, coags intermittently  Hyperglycemia No history of DM per family. Sliding scale insulin ordered, CBGs Goal CBG <180  GERD with esophagitis Continue PPI  Hypothyroidism Continue levothyroxine  Constipation Bowel regimen  Best Practice: (right click and "Reselect all SmartList Selections" daily)   Diet/type: NPO w/ meds via tube DVT prophylaxis: Lovenox GI prophylaxis: PPI Lines: Central line and Arterial Line Foley:  N/A Code Status:  full code Last date of multidisciplinary goals of care discussion [Pending - family updated at bedside daily, 3/16]  Critical care time:    The patient is critically ill with multiple organ system failure and requires high complexity decision making for assessment and support,  frequent evaluation and titration of therapies, advanced monitoring, review of radiographic studies and interpretation of complex data.   Critical Care Time devoted to patient care services, exclusive of separately billable procedures, described in this note is 35 minutes.   Marshell Garfinkel MD [facility] See Amion for pager  If no response to pager , please call [phone] until 7pm After 7:00 pm call Elink  [phone] 11/18/2022, 8:28 AM

## 2022-11-18 NOTE — Progress Notes (Signed)
Pt taken off BiPAP and placed on 10L HFNC per MD. Pt tolerating well at this time,vitals stable, Spo2 95%, no increased WOB noted, MD aware, RN aware,RT will monitor.     11/18/22 0837  Respiratory Assessment  Assessment Type Assess only  Respiratory Pattern Regular;Unlabored  Chest Assessment Chest expansion symmetrical  Cough None  Bilateral Breath Sounds Diminished  Oxygen Therapy/Pulse Ox  O2 Device (S)  HFNC  O2 Therapy Oxygen humidified  O2 Flow Rate (L/min) 10 L/min

## 2022-11-18 NOTE — Progress Notes (Signed)
[facility] ADULT ICU REPLACEMENT PROTOCOL   The patient does apply for the [facility] Adult ICU Electrolyte Replacment Protocol based on the criteria listed below:   1.Exclusion criteria: TCTS, ECMO, Dialysis, and Myasthenia Gravis patients 2. Is GFR >/= 30 ml/min? Yes.    Patient's GFR today is > 60 3. Is SCr </= 2? Yes.   Patient's SCr is 1.00 mg/dL 4. Did SCr increase >/= 0.5 in 24 hours? No. 5.Pt's weight >40kg  Yes.   6. Abnormal electrolyte(s): K+ 3.1  7. Electrolytes replaced per protocol 8.  Call MD STAT for K+ </= 2.5, Phos </= 1, or Mag </= 1 Physician:  Dr York Ram, Elfredia Nevins 11/18/2022 6:42 AM

## 2022-11-18 NOTE — Progress Notes (Addendum)
[place] Progress Note Patient Name: Cynthia Underwood DOB: Feb 04, 1971 MRN: [id number]   Date of Service  11/18/2022  HPI/Events of Note  Alert:  Camera to room. Extubated yesterday, was on HFNC, now on BiPAP 100%, 14/8. Ve 8 lit. HR 50's. MAP > 65 On 0.5 precedex drip Mild confusion. Able to protect airways.  Bed ide team, asking ground team at bed side.  3/9 Pneumonia 3/12 v fib/PEA arrest, Intubated. LHC clean on 14 th. Extubated on 15 th to HFNC. Received lasix, nebs.  Low threshold for re intubation.   eICU Interventions  Notified and discussed with NP.  Stat ABG and CxR ordered Asp precautions  Camera:    Intervention Category Major Interventions: Respiratory failure - evaluation and management Minor Interventions: Communication with other healthcare providers and/or family  Elmer Sow 11/18/2022, 7:24 PM  19:58 CxR looks fine, minimal basal atelectasis,  should not cause so much breathlessness chest on 9th neg for PE,  looks like from myotonic dystrophy/arythmia, muscle weakness- tachypnea. need NIFF? VC when . Dr Elsworth Soho : discussion on secure chat done.

## 2022-11-18 NOTE — Progress Notes (Signed)
Late entry, RT assisted DR Elsworth Soho with intubation.

## 2022-11-18 NOTE — Progress Notes (Signed)
Electrophysiology Progress Note  Patient Name: Cynthia Underwood Date of Encounter: 11/18/2022  Primary Cardiologist: Fransico Him, MD   Subjective   Pt currently with PAP. Somnolent.   Inpatient Medications    Scheduled Meds:  Chlorhexidine Gluconate Cloth  6 each Topical Daily   enoxaparin (LOVENOX) injection  30 mg Subcutaneous Q24H   feeding supplement (PROSource TF20)  60 mL Per Tube Daily   insulin aspart  0-9 Units Subcutaneous Q4H   levothyroxine  112 mcg Per Tube QAC breakfast   pantoprazole (PROTONIX) IV  40 mg Intravenous QHS   sodium chloride flush  3 mL Intravenous Q12H   spironolactone  12.5 mg Per Tube Daily   sucralfate  1 g Per Tube BID   thiamine  100 mg Per Tube Daily   Continuous Infusions:  sodium chloride     dexmedetomidine (PRECEDEX) IV infusion 0.6 mcg/kg/hr (11/17/22 2332)   feeding supplement (VITAL 1.5 CAL) Stopped (11/17/22 2135)   fentaNYL infusion INTRAVENOUS Stopped (11/17/22 1532)   potassium chloride     PRN Meds: sodium chloride, acetaminophen, docusate, fentaNYL, guaiFENesin, ipratropium-albuterol, melatonin, midazolam, mouth rinse, mouth rinse, polyethylene glycol, sodium chloride flush   Vital Signs    Vitals:   11/18/22 0355 11/18/22 0400 11/18/22 0500 11/18/22 0600  BP:      Pulse: 89 89 89 89  Resp: 20 20 20 20   Temp:      TempSrc:      SpO2: 95% 96% 98% 99%  Weight:   45.2 kg   Height:        Intake/Output Summary (Last 24 hours) at 11/18/2022 0705 Last data filed at 11/18/2022 0600 Gross per 24 hour  Intake 498.37 ml  Output 1450 ml  Net -951.63 ml   Filed Weights   11/11/22 1211 11/18/22 0500  Weight: 44.5 kg 45.2 kg    Telemetry    A-V paced. QRS appears slightly narrower than overnight - Personally Reviewed  ECG    This AM: AV paced - Personally Reviewed  Physical Exam   GEN: Appears chronically illNo acute distress.   Cardiac: RRR, no murmurs, rubs, or gallops.  Respiratory: breath sounds  coarse. GI: Soft, nontender, non-distended  MS: No edema; No deformity. Neuro:  Nonfocal  Psych: Normal affect   Labs    Chemistry Recent Labs  Lab 11/15/22 1500 11/16/22 0328 11/16/22 1000 11/17/22 0407 11/17/22 2229 11/18/22 0517 11/18/22 0556  NA 141 140   < > 144 150* 149* 147*  K 4.0 3.4*   < > 2.9* 3.2* 3.3* 3.1*  CL 107 109  --  114*  --   --  115*  CO2 21* 21*  --  21*  --   --  23  GLUCOSE 143* 118*  --  113*  --   --  129*  BUN 7 15  --  10  --   --  17  CREATININE 0.81 0.88  --  0.76  --   --  1.00  CALCIUM 8.9 8.4*  --  8.6*  --   --  8.8*  PROT 6.0* 5.5*  --   --   --   --  5.5*  ALBUMIN 2.8* 2.5*  --   --   --   --  2.4*  AST 234* 87*  --   --   --   --  20  ALT 187* 134*  --   --   --   --  60*  ALKPHOS 50  41  --   --   --   --  37*  BILITOT 0.4 0.7  --   --   --   --  0.5  GFRNONAA >60 >60  --  >60  --   --  >60  ANIONGAP 13 10  --  9  --   --  9   < > = values in this interval not displayed.     Hematology Recent Labs  Lab 11/16/22 0328 11/16/22 1000 11/17/22 0407 11/17/22 2229 11/18/22 0517 11/18/22 0556  WBC 12.6*  --  7.1  --   --  8.7  RBC 3.48*  --  3.30*  --   --  3.17*  HGB 11.2*   < > 10.6* 11.2* 9.9* 10.2*  HCT 32.1*   < > 30.9* 33.0* 29.0* 31.2*  MCV 92.2  --  93.6  --   --  98.4  MCH 32.2  --  32.1  --   --  32.2  MCHC 34.9  --  34.3  --   --  32.7  RDW 15.2  --  15.7*  --   --  16.1*  PLT 274  --  237  --   --  208   < > = values in this interval not displayed.    Cardiac EnzymesNo results for input(s): "TROPONINI" in the last 168 hours. No results for input(s): "TROPIPOC" in the last 168 hours.   BNP Recent Labs  Lab 11/11/22 1238 11/17/22 2150  BNP 58.5 145.2*     DDimer No results for input(s): "DDIMER" in the last 168 hours.   Radiology    DG Chest Port 1 View  Result Date: 11/17/2022 CLINICAL DATA:  5626 Acute respiratory failure ([place]) 5626 [id number] Oxygen desaturation [id number] EXAM: PORTABLE CHEST 1 VIEW  COMPARISON:  11/15/2022 FINDINGS: Left pacer, left central line remain in place, unchanged. Interval removal of NG tube and endotracheal tube. Feeding tube is been placed, projecting off the lower aspect of the image. Right base atelectasis. No confluent opacity on the left. No effusions. Heart and mediastinal contours within normal limits. IMPRESSION: Right base atelectasis. Electronically Signed   By: Rolm Baptise M.D.   On: 11/17/2022 21:04   CARDIAC CATHETERIZATION  Result Date: 11/16/2022 1. Normal coronaries, nonischemic cardiomyopathy. 2. Near-normal filling pressures and normal PA pressure. 3. Excellent cardiac output by Fick with no step up in oxygen saturation to suggest a shunt lesion. 4. Marked difference between thermodilution CO and Fick CO, based on clinical picture suspect Fick is more accurate.    Cardiac Studies   TTE 3/13 EF 20%  Patient Profile     52 y.o. female with a history of myotonic dystrophy, high grade AV block s/p MDT DDD pacemaker (HIS bundle), anemia. EP is consulted for evaluation of VT/VF. She has had a complicated course, admitted 3/9 for acute hypoxic respiratory failure c/b VF arrest (3/12), BiV failure  Assessment & Plan    Polymorphic VT Due to long-short interval (R-on-T) in the setting of QT-prolonging drugs, hypokalemia Pacemaker lower rate limit has been increased to prevent pause-dependent TdP; this should be reprogrammed to a more physiologic rate when the patient recovers and is approaching discharge.  Cardiomyopathy: Paced complex is quite wide She is A-paced, V-sensed now  Myotonic dystrophy  Medtronic dual chamber pacemaker: may require upgrade with LV lead. V this admission was attributable to a reversible cause, so ICD not indicated urgently    For questions or updates, please  contact [place] Please consult [email or web address] for contact info under Cardiology/STEMI.      Signed, Melida Quitter, MD 11/18/2022, 7:05 AM

## 2022-11-18 NOTE — Progress Notes (Signed)
PT Cancellation Note  Patient Details Name: KEYANDA HETMAN MRN: [id number] DOB: April 01, 1971   Cancelled Treatment:    Reason Eval/Treat Not Completed: Patient not medically ready - PT reconsulted s/p extubation on 3/15, but pt on bipap for work of breathing. RN request hold, PT to sign off again and please reconsult when pt is medically ready.   Stacie Glaze, PT DPT Acute Rehabilitation Services Pager [phone]  Office [phone]    [place] 11/18/2022, 8:25 AM

## 2022-11-18 NOTE — Progress Notes (Signed)
Advanced Heart Failure Rounding Note  PCP-Cardiologist: Fransico Him, MD   Subjective:    Cath 3/14 c/w nonischemic cardiomyopathy, filling pressures minimally elevated on RHC. Excellent cardiac output by Fick.  Extubated yesterday.   Awake. Will follow some commands. No further VT/VF with a-pacing at 90   Objective:   Weight Range: 45.2 kg Body mass index is 17.65 kg/m.   Vital Signs:   Temp:  [97.6 F (36.4 C)-98.8 F (37.1 C)] 97.6 F (36.4 C) (03/16 1157) Pulse Rate:  [60-115] 89 (03/16 1100) Resp:  [7-22] 16 (03/16 1100) SpO2:  [89 %-100 %] 96 % (03/16 1100) Arterial Line BP: (92-139)/(52-77) 94/56 (03/16 1100) FiO2 (%):  [40 %-100 %] 80 % (03/16 0800) Weight:  [45.2 kg] 45.2 kg (03/16 0500) Last BM Date : 11/15/22  Weight change: Filed Weights   11/11/22 1211 11/18/22 0500  Weight: 44.5 kg 45.2 kg    Intake/Output:   Intake/Output Summary (Last 24 hours) at 11/18/2022 1256 Last data filed at 11/18/2022 1100 Gross per 24 hour  Intake 661.48 ml  Output 2350 ml  Net -1688.52 ml       Physical Exam    General:  Cachetic. Ill-appearing. No resp difficulty HEENT: normal Neck: supple. JVP 7-8 Carotids 2+ bilat; no bruits. No lymphadenopathy or thryomegaly appreciated. Cor: PMI nondisplaced. Regular rate & rhythm. No rubs, gallops or murmurs. Lungs: clear Abdomen: soft, nontender, nondistended. No hepatosplenomegaly. No bruits or masses. Good bowel sounds. Extremities: no cyanosis, clubbing, rash, edema Neuro: awake. Will follow some commands. Very weak    Telemetry   A-paced 90s. Personally reviewed   Labs    CBC Recent Labs    11/17/22 0407 11/17/22 2229 11/18/22 0517 11/18/22 0556  WBC 7.1  --   --  8.7  HGB 10.6*   < > 9.9* 10.2*  HCT 30.9*   < > 29.0* 31.2*  MCV 93.6  --   --  98.4  PLT 237  --   --  208   < > = values in this interval not displayed.    Basic Metabolic Panel Recent Labs    11/16/22 2130 11/17/22 0407  11/17/22 2229 11/18/22 0517 11/18/22 0556  NA  --  144   < > 149* 147*  K  --  2.9*   < > 3.3* 3.1*  CL  --  114*  --   --  115*  CO2  --  21*  --   --  23  GLUCOSE  --  113*  --   --  129*  BUN  --  10  --   --  17  CREATININE  --  0.76  --   --  1.00  CALCIUM  --  8.6*  --   --  8.8*  MG 2.1 2.2  --   --   --   PHOS 2.7 2.9  --   --   --    < > = values in this interval not displayed.    Liver Function Tests Recent Labs    11/16/22 0328 11/18/22 0556  AST 87* 20  ALT 134* 60*  ALKPHOS 41 37*  BILITOT 0.7 0.5  PROT 5.5* 5.5*  ALBUMIN 2.5* 2.4*    No results for input(s): "LIPASE", "AMYLASE" in the last 72 hours. Cardiac Enzymes No results for input(s): "CKTOTAL", "CKMB", "CKMBINDEX", "TROPONINI" in the last 72 hours.  BNP: BNP (last 3 results) Recent Labs    11/11/22 1238 11/17/22 2150  BNP 58.5 145.2*     ProBNP (last 3 results) No results for input(s): "PROBNP" in the last 8760 hours.   D-Dimer No results for input(s): "DDIMER" in the last 72 hours. Hemoglobin A1C Recent Labs    11/15/22 1829  HGBA1C 5.6    Fasting Lipid Panel No results for input(s): "CHOL", "HDL", "LDLCALC", "TRIG", "CHOLHDL", "LDLDIRECT" in the last 72 hours. Thyroid Function Tests No results for input(s): "TSH", "T4TOTAL", "T3FREE", "THYROIDAB" in the last 72 hours.  Invalid input(s): "FREET3"  Other results:   Imaging    DG Chest Port 1 View  Result Date: 11/17/2022 CLINICAL DATA:  5626 Acute respiratory failure ([place]) 5626 [id number] Oxygen desaturation 242050 EXAM: PORTABLE CHEST 1 VIEW COMPARISON:  11/15/2022 FINDINGS: Left pacer, left central line remain in place, unchanged. Interval removal of NG tube and endotracheal tube. Feeding tube is been placed, projecting off the lower aspect of the image. Right base atelectasis. No confluent opacity on the left. No effusions. Heart and mediastinal contours within normal limits. IMPRESSION: Right base atelectasis. Electronically  Signed   By: Rolm Baptise M.D.   On: 11/17/2022 21:04     Medications:     Scheduled Medications:  Chlorhexidine Gluconate Cloth  6 each Topical Daily   enoxaparin (LOVENOX) injection  30 mg Subcutaneous Q24H   feeding supplement (PROSource TF20)  60 mL Per Tube Daily   insulin aspart  0-9 Units Subcutaneous Q4H   levothyroxine  112 mcg Per Tube QAC breakfast   pantoprazole (PROTONIX) IV  40 mg Intravenous QHS   polyethylene glycol  17 g Per Tube Daily   senna  1 tablet Per Tube QHS   sodium chloride flush  3 mL Intravenous Q12H   spironolactone  12.5 mg Per Tube Daily   sucralfate  1 g Per Tube BID   thiamine  100 mg Per Tube Daily    Infusions:  sodium chloride     dexmedetomidine (PRECEDEX) IV infusion 0.9 mcg/kg/hr (11/18/22 1100)   feeding supplement (VITAL 1.5 CAL) 20 mL/hr at 11/18/22 1100   potassium chloride 10 mEq (11/18/22 1220)    PRN Medications: sodium chloride, acetaminophen, docusate, fentaNYL (SUBLIMAZE) injection, guaiFENesin, ipratropium-albuterol, melatonin, midazolam, mouth rinse, mouth rinse, polyethylene glycol, sodium chloride flush    Patient Profile   52 y.o. female with a hx of tobacco use, COPD, myotonic dystrophy type I since age 42, high grade heart block s/p PPM, RBBB, sickle cell trait, and DM1 (questionable diagnosis) and strong family history of heart failure (father s/p cardiac transplant and paternal aunt w/ NICM w/ LVAD), admitted w/ acute hypoxic respiratory failure/ LLL CAP secondary to metapneumovirus. Hospital course c/b VF arrest. Post arrest echo w/ severe BiV failure, LVEF < 20%, RV severely reduced + diffuse HK   Assessment/Plan    1. VF arrest: 3/12, shocked. 15 minutes CPR total.  - Due to long-short interval (R-on-T) in the setting of QT-prolonging drugs (azithro) and hypokalemia - Pacemaker lower rate limit has been increased to prevent pause-dependent TdP; this should be reprogrammed to a more physiologic rate when the  patient recovers and is approaching discharge. - Keep K> 4.0 Mg > 2.0 - avoid WT prolonging agents  2. Acute hypoxemic respiratory failure: Intubated post-VF arrest.  Had viral PNA with metapneumovirus at presentation.  - extubated 3/15 - CCM managing  3. Acute systolic CHF: Prior workup for cardiac sarcoidosis. Cardiac MRI in 2017 showed subendocardial LGE in the mid inferior and inferolateral walls with EF 56%.  Cardiac PET  done after this was NOT suggestive of cardiac sarcoidosis.    - Echo in 5/22 with normal EF.   - Echo this admission reviewed, EF 20-25% range with diffuse hypokinesis but relatively preserved function at the apex, moderate RV dysfunction.  - cath 3/15 no CAD - suspect due to myotonic dystrophy type 1 -> she has an aunt with LVAD who has cardiomyopathy due to myotonic dystrophy.  Father had a heart transplant, uncertain of underlying condition. R/LHC c/w Nonischemic cardiomyopathy, - Gradual introduction of GDMT  - Consider LifeVest at d/c if doesn't qualify for ICD (felt to be reversible cause) - Will need to reassess EF as outptient to see if EF recovers post-arrest - Given overall debility, I do not think she would be candidate for advanced therapies    4. High grade heart block: Has MDT PPM with His bundle lead.  This is likely due to cardiac involvement of myotonic dystrophy type 1.  - plan as abov  5. Myotonic dystrophy: Type 1, diagnosed at age 51. Has family members with the same diagnosis.  Followed by neurology.   6. Tobacco abuse - needs cessation  CRITICAL CARE Performed by: Glori Bickers  Total critical care time: 35 minutes  Critical care time was exclusive of separately billable procedures and treating other patients.  Critical care was necessary to treat or prevent imminent or life-threatening deterioration.  Critical care was time spent personally by me (independent of midlevel providers or residents) on the following activities:  development of treatment plan with patient and/or surrogate as well as nursing, discussions with consultants, evaluation of patient's response to treatment, examination of patient, obtaining history from patient or surrogate, ordering and performing treatments and interventions, ordering and review of laboratory studies, ordering and review of radiographic studies, pulse oximetry and re-evaluation of patient's condition.   Length of Stay: [place], MD  11/18/2022, 12:56 PM  Advanced Heart Failure Team Pager [phone] (M-F; 7a - 5p)  Please contact [facility] for night-coverage after hours (5p -7a ) and weekends on [email or web address]

## 2022-11-19 DIAGNOSIS — R9431 Abnormal electrocardiogram [ECG] [EKG]: Secondary | ICD-10-CM | POA: Diagnosis not present

## 2022-11-19 DIAGNOSIS — I5021 Acute systolic (congestive) heart failure: Secondary | ICD-10-CM | POA: Diagnosis not present

## 2022-11-19 DIAGNOSIS — I469 Cardiac arrest, cause unspecified: Secondary | ICD-10-CM | POA: Diagnosis not present

## 2022-11-19 DIAGNOSIS — J189 Pneumonia, unspecified organism: Secondary | ICD-10-CM | POA: Diagnosis not present

## 2022-11-19 LAB — CBC
HCT: 31.1 % — ABNORMAL LOW (ref 36.0–46.0)
Hemoglobin: 10.6 g/dL — ABNORMAL LOW (ref 12.0–15.0)
MCH: 32.4 pg (ref 26.0–34.0)
MCHC: 34.1 g/dL (ref 30.0–36.0)
MCV: 95.1 fL (ref 80.0–100.0)
Platelets: 266 10*3/uL (ref 150–400)
RBC: 3.27 MIL/uL — ABNORMAL LOW (ref 3.87–5.11)
RDW: 15.6 % — ABNORMAL HIGH (ref 11.5–15.5)
WBC: 16.8 10*3/uL — ABNORMAL HIGH (ref 4.0–10.5)
nRBC: 0 % (ref 0.0–0.2)

## 2022-11-19 LAB — GLUCOSE, CAPILLARY
Glucose-Capillary: 107 mg/dL — ABNORMAL HIGH (ref 70–99)
Glucose-Capillary: 123 mg/dL — ABNORMAL HIGH (ref 70–99)
Glucose-Capillary: 126 mg/dL — ABNORMAL HIGH (ref 70–99)
Glucose-Capillary: 149 mg/dL — ABNORMAL HIGH (ref 70–99)
Glucose-Capillary: 150 mg/dL — ABNORMAL HIGH (ref 70–99)
Glucose-Capillary: 207 mg/dL — ABNORMAL HIGH (ref 70–99)

## 2022-11-19 LAB — COOXEMETRY PANEL
Carboxyhemoglobin: 1.5 % (ref 0.5–1.5)
Methemoglobin: <0.7 % (ref 0.0–1.5)
O2 Saturation: 73.9 %
Total hemoglobin: 10.9 g/dL — ABNORMAL LOW (ref 12.0–16.0)

## 2022-11-19 LAB — BASIC METABOLIC PANEL
Anion gap: 12 (ref 5–15)
BUN: 15 mg/dL (ref 6–20)
CO2: 25 mmol/L (ref 22–32)
Calcium: 8.9 mg/dL (ref 8.9–10.3)
Chloride: 108 mmol/L (ref 98–111)
Creatinine, Ser: 0.8 mg/dL (ref 0.44–1.00)
GFR, Estimated: >60 mL/min (ref >60)
Glucose, Bld: 178 mg/dL — ABNORMAL HIGH (ref 70–99)
Potassium: 2.4 mmol/L — CL (ref 3.5–5.1)
Sodium: 145 mmol/L (ref 135–145)

## 2022-11-19 LAB — MAGNESIUM: Magnesium: 1.8 mg/dL (ref 1.7–2.4)

## 2022-11-19 LAB — POTASSIUM: Potassium: 4 mmol/L (ref 3.5–5.1)

## 2022-11-19 MED ORDER — POTASSIUM CHLORIDE 10 MEQ/50ML IV SOLN
10.0000 meq | INTRAVENOUS | Status: AC
  Administered 2022-11-19 (×6): 10 meq via INTRAVENOUS
  Filled 2022-11-19 (×6): qty 50

## 2022-11-19 MED ORDER — FENTANYL 2500MCG IN NS 250ML (10MCG/ML) PREMIX INFUSION
0.0000 ug/h | INTRAVENOUS | Status: DC
  Administered 2022-11-19: 25 ug/h via INTRAVENOUS
  Administered 2022-11-20: 150 ug/h via INTRAVENOUS
  Administered 2022-11-21: 75 ug/h via INTRAVENOUS
  Administered 2022-11-23: 25 ug/h via INTRAVENOUS
  Administered 2022-11-24 – 2022-11-26 (×2): 50 ug/h via INTRAVENOUS
  Administered 2022-11-27: 100 ug/h via INTRAVENOUS
  Filled 2022-11-19 (×8): qty 250

## 2022-11-19 MED ORDER — MAGNESIUM SULFATE 4 GM/100ML IV SOLN
4.0000 g | Freq: Once | INTRAVENOUS | Status: AC
  Administered 2022-11-19: 4 g via INTRAVENOUS
  Filled 2022-11-19: qty 100

## 2022-11-19 MED ORDER — POTASSIUM CHLORIDE 20 MEQ PO PACK
40.0000 meq | PACK | Freq: Two times a day (BID) | ORAL | Status: DC
  Administered 2022-11-19 – 2022-11-21 (×5): 40 meq
  Filled 2022-11-19 (×5): qty 2

## 2022-11-19 NOTE — Progress Notes (Signed)
Electrophysiology Progress Note  Patient Name: Cynthia Underwood Date of Encounter: 11/19/2022  Primary Cardiologist: Fransico Him, MD   Subjective   Pt was reintubated yesterday   Off telemetry this AM; no telemetry data available for review.  Inpatient Medications    Scheduled Meds:  Chlorhexidine Gluconate Cloth  6 each Topical Daily   enoxaparin (LOVENOX) injection  30 mg Subcutaneous Q24H   feeding supplement (PROSource TF20)  60 mL Per Tube Daily   insulin aspart  0-9 Units Subcutaneous Q4H   levothyroxine  112 mcg Per Tube QAC breakfast   mouth rinse  15 mL Mouth Rinse Q2H   pantoprazole (PROTONIX) IV  40 mg Intravenous QHS   polyethylene glycol  17 g Per Tube Daily   potassium chloride  40 mEq Per Tube BID   senna  1 tablet Per Tube QHS   sodium chloride flush  3 mL Intravenous Q12H   sucralfate  1 g Per Tube BID   thiamine  100 mg Per Tube Daily   Continuous Infusions:  sodium chloride     dexmedetomidine (PRECEDEX) IV infusion 1.4 mcg/kg/hr (11/19/22 0557)   feeding supplement (VITAL 1.5 CAL) 40 mL/hr at 11/19/22 0557   fentaNYL infusion INTRAVENOUS 25 mcg/hr (11/19/22 0623)   magnesium sulfate bolus IVPB     norepinephrine (LEVOPHED) Adult infusion 10 mcg/min (11/19/22 0557)   potassium chloride 10 mEq (11/19/22 0756)   PRN Meds: sodium chloride, acetaminophen, docusate, fentaNYL (SUBLIMAZE) injection, fentaNYL (SUBLIMAZE) injection, guaiFENesin, ipratropium-albuterol, melatonin, midazolam, mouth rinse, mouth rinse, mouth rinse, polyethylene glycol, sodium chloride flush   Vital Signs    Vitals:   11/19/22 0630 11/19/22 0645 11/19/22 0700 11/19/22 0815  BP: (!) 140/89 (!) 154/95 108/72   Pulse: 89 89 89   Resp: (!) 24 (!) 24 (!) 24   Temp:    97.9 F (36.6 C)  TempSrc:    Axillary  SpO2: 100% 100% 100%   Weight:      Height:        Intake/Output Summary (Last 24 hours) at 11/19/2022 0821 Last data filed at 11/19/2022 0710 Gross per 24 hour   Intake 1627.14 ml  Output 1650 ml  Net -22.86 ml   Filed Weights   11/11/22 1211 11/18/22 0500 11/19/22 0500  Weight: 44.5 kg 45.2 kg 44.6 kg    Telemetry    No data available for review. Telemetry resumed this morning shows A-V paced rhythm - Personally Reviewed  ECG    This AM: AV paced - Personally Reviewed  Physical Exam   GEN: Appears chronically ill No acute distress.   Cardiac: RRR, no murmurs, rubs, or gallops.  Respiratory: breath sounds coarse. GI: Soft, nontender, non-distended  MS: No edema; No deformity. Neuro:  Nonfocal  Psych: Normal affect   Labs    Chemistry Recent Labs  Lab 11/15/22 1500 11/16/22 0328 11/16/22 1000 11/17/22 0407 11/17/22 2229 11/18/22 0556 11/18/22 1931 11/19/22 0450  NA 141 140   < > 144   < > 147* 149* 145  K 4.0 3.4*   < > 2.9*   < > 3.1* 3.2* 2.4*  CL 107 109  --  114*  --  115*  --  108  CO2 21* 21*  --  21*  --  23  --  25  GLUCOSE 143* 118*  --  113*  --  129*  --  178*  BUN 7 15  --  10  --  17  --  15  CREATININE 0.81 0.88  --  0.76  --  1.00  --  0.80  CALCIUM 8.9 8.4*  --  8.6*  --  8.8*  --  8.9  PROT 6.0* 5.5*  --   --   --  5.5*  --   --   ALBUMIN 2.8* 2.5*  --   --   --  2.4*  --   --   AST 234* 87*  --   --   --  20  --   --   ALT 187* 134*  --   --   --  60*  --   --   ALKPHOS 50 41  --   --   --  37*  --   --   BILITOT 0.4 0.7  --   --   --  0.5  --   --   GFRNONAA >60 >60  --  >60  --  >60  --  >60  ANIONGAP 13 10  --  9  --  9  --  12   < > = values in this interval not displayed.     Hematology Recent Labs  Lab 11/17/22 0407 11/17/22 2229 11/18/22 0556 11/18/22 1931 11/19/22 0450  WBC 7.1  --  8.7  --  16.8*  RBC 3.30*  --  3.17*  --  3.27*  HGB 10.6*   < > 10.2* 10.9* 10.6*  HCT 30.9*   < > 31.2* 32.0* 31.1*  MCV 93.6  --  98.4  --  95.1  MCH 32.1  --  32.2  --  32.4  MCHC 34.3  --  32.7  --  34.1  RDW 15.7*  --  16.1*  --  15.6*  PLT 237  --  208  --  266   < > = values in this  interval not displayed.    Cardiac EnzymesNo results for input(s): "TROPONINI" in the last 168 hours. No results for input(s): "TROPIPOC" in the last 168 hours.   BNP Recent Labs  Lab 11/17/22 2150  BNP 145.2*     DDimer No results for input(s): "DDIMER" in the last 168 hours.   Radiology    DG CHEST PORT 1 VIEW  Result Date: 11/18/2022 CLINICAL DATA:  Respiratory failure EXAM: PORTABLE CHEST 1 VIEW COMPARISON:  11/18/2022 FINDINGS: Endotracheal tube seen 15 mm above the carina. Nasoenteric feeding tube extends into the upper abdomen beyond the margin of the examination. Left internal jugular central venous catheter tip noted at the superior cavoatrial junction. Pulmonary insufflation is normal and stable. Bibasilar pulmonary infiltrates are stable. Small right pleural effusion is unchanged. No pneumothorax. Cardiac size within normal limits. Left subclavian dual lead pacemaker unchanged. Pulmonary vascularity is normal. Surgical clips noted at the thoracic inlet. No acute bone abnormality. IMPRESSION: 1. Support lines and tubes in appropriate position. 2. Stable bibasilar pulmonary infiltrates and small right pleural effusion. Electronically Signed   By: Fidela Salisbury M.D.   On: 11/18/2022 20:02   DG CHEST PORT 1 VIEW  Result Date: 11/18/2022 CLINICAL DATA:  Hypoxemia EXAM: PORTABLE CHEST 1 VIEW COMPARISON:  11/18/2022 FINDINGS: Left pacer, left central line, and feeding tube remain in place, unchanged. Heart and mediastinal contours are within normal limits. Bibasilar airspace opacities, new since prior study. No visible effusions or pneumothorax. No acute bony abnormality. IMPRESSION: Bibasilar airspace opacities could reflect atelectasis or infiltrates/pneumonia. Electronically Signed   By: Rolm Baptise M.D.   On: 11/18/2022 19:36  DG Chest Port 1 View  Result Date: 11/18/2022 CLINICAL DATA:  Acute respiratory failure. Community acquired pneumonia. EXAM: PORTABLE CHEST 1 VIEW  COMPARISON:  Radiograph yesterday. CT 11/11/2022 FINDINGS: Weighted enteric tube tip is below the diaphragm. Left-sided pacemaker in place. The heart is normal in size. Decreased volume loss in the right hemithorax with interval expansion of the right lower lobe. Persistent patchy right infrahilar opacity. Streaky retrocardiac atelectasis or scarring. No pleural fluid, pulmonary edema or pneumothorax. IMPRESSION: 1. Weighted enteric tube tip below the diaphragm. 2. Improved right lung aeration with persistent patchy right infrahilar opacity, may represent atelectasis or pneumonia. 3. Streaky retrocardiac atelectasis or scarring. Electronically Signed   By: Keith Rake M.D.   On: 11/18/2022 18:03   DG Chest Port 1 View  Result Date: 11/17/2022 CLINICAL DATA:  5626 Acute respiratory failure ([place]) 5626 [id number] Oxygen desaturation [id number] EXAM: PORTABLE CHEST 1 VIEW COMPARISON:  11/15/2022 FINDINGS: Left pacer, left central line remain in place, unchanged. Interval removal of NG tube and endotracheal tube. Feeding tube is been placed, projecting off the lower aspect of the image. Right base atelectasis. No confluent opacity on the left. No effusions. Heart and mediastinal contours within normal limits. IMPRESSION: Right base atelectasis. Electronically Signed   By: Rolm Baptise M.D.   On: 11/17/2022 21:04    Cardiac Studies   TTE 3/13 EF 20%  Patient Profile     52 y.o. female with a history of myotonic dystrophy, high grade AV block s/p MDT DDD pacemaker (HIS bundle), anemia. EP is consulted for evaluation of VT/VF. She has had a complicated course, admitted 3/9 for acute hypoxic respiratory failure c/b VF arrest (3/12), BiV failure  Assessment & Plan    Polymorphic VT Due to long-short interval (R-on-T) in the setting of QT-prolonging drugs, hypokalemia Pacemaker lower rate limit has been increased to prevent pause-dependent TdP; this should be reprogrammed to a more physiologic rate when the  patient recovers and is approaching discharge. 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 alternate cause (or contrinubuting cause) could be pacing-induced cardiomyopathy - GDMT being introduced by CHF team  Myotonic dystrophy  Medtronic dual chamber pacemaker: may require upgrade with LV lead. May require upgrade to primary prevention ICD if EF does not recover with GDMT. VT this admission was attributable to a reversible cause, so ICD not indicated urgently for secondary prevention. Would consider placement of LifeVest on discharge/    For questions or updates, please contact [facility] [place] Please consult [email or web address] for contact info under Cardiology/STEMI.      Signed, Melida Quitter, MD 11/19/2022, 8:21 AM

## 2022-11-19 NOTE — Progress Notes (Signed)
Advanced Heart Failure Rounding Note  PCP-Cardiologist: Fransico Him, MD   Subjective:    Cath 3/14 c/w nonischemic cardiomyopathy, filling pressures minimally elevated on RHC. Excellent cardiac output by Fick.  Extubated 3/15. Reintubated last night (3/16)  Remains intubated sedated   Co-ox 74%  Volume status ok. K2.4 Mag 1.8  No further torsades with pacing    Objective:   Weight Range: 44.6 kg Body mass index is 17.42 kg/m.   Vital Signs:   Temp:  [97.4 F (36.3 C)-98.9 F (37.2 C)] 97.9 F (36.6 C) (03/17 0815) Pulse Rate:  [89-127] 89 (03/17 1030) Resp:  [8-28] 24 (03/17 1030) BP: (88-154)/(64-96) 93/64 (03/17 1030) SpO2:  [87 %-100 %] 94 % (03/17 1030) Arterial Line BP: (94-104)/(57-62) 94/57 (03/16 1700) FiO2 (%):  [60 %-100 %] 60 % (03/17 0822) Weight:  [44.6 kg] 44.6 kg (03/17 0500) Last BM Date : 11/19/22  Weight change: Filed Weights   11/11/22 1211 11/18/22 0500 11/19/22 0500  Weight: 44.5 kg 45.2 kg 44.6 kg    Intake/Output:   Intake/Output Summary (Last 24 hours) at 11/19/2022 1103 Last data filed at 11/19/2022 1000 Gross per 24 hour  Intake 2031.34 ml  Output 750 ml  Net 1281.34 ml       Physical Exam    General:  Intubated Cachetic. Ill-appearing. No resp difficulty HEENT: normal + ETT Neck: supple. no JVD. Carotids 2+ bilat; no bruits. No lymphadenopathy or thryomegaly appreciated. Cor: PMI nondisplaced. Regular rate & rhythm. No rubs, gallops or murmurs. Lungs: clear Abdomen: soft, nontender, nondistended. No hepatosplenomegaly. No bruits or masses. Good bowel sounds. Extremities: no cyanosis, clubbing, rash, edema Neuro: intubated/sedated   Telemetry   A-paced 90s.No VT/VF Personally reviewed   Labs    CBC Recent Labs    11/18/22 0556 11/18/22 1931 11/19/22 0450  WBC 8.7  --  16.8*  HGB 10.2* 10.9* 10.6*  HCT 31.2* 32.0* 31.1*  MCV 98.4  --  95.1  PLT 208  --  [id number]    Basic Metabolic Panel Recent Labs     11/16/22 2130 11/16/22 2130 11/17/22 0407 11/17/22 2229 11/18/22 0556 11/18/22 1931 11/19/22 0450 11/19/22 0500  NA  --   --  144   < > 147* 149* 145  --   K  --   --  2.9*   < > 3.1* 3.2* 2.4*  --   CL  --    < > 114*  --  115*  --  108  --   CO2  --    < > 21*  --  23  --  25  --   GLUCOSE  --    < > 113*  --  129*  --  178*  --   BUN  --    < > 10  --  17  --  15  --   CREATININE  --    < > 0.76  --  1.00  --  0.80  --   CALCIUM  --    < > 8.6*  --  8.8*  --  8.9  --   MG 2.1  --  2.2  --   --   --   --  1.8  PHOS 2.7  --  2.9  --   --   --   --   --    < > = values in this interval not displayed.    Liver Function Tests Recent Labs  11/18/22 0556  AST 20  ALT 60*  ALKPHOS 37*  BILITOT 0.5  PROT 5.5*  ALBUMIN 2.4*    No results for input(s): "LIPASE", "AMYLASE" in the last 72 hours. Cardiac Enzymes No results for input(s): "CKTOTAL", "CKMB", "CKMBINDEX", "TROPONINI" in the last 72 hours.  BNP: BNP (last 3 results) Recent Labs    11/11/22 1238 11/17/22 2150  BNP 58.5 145.2*     ProBNP (last 3 results) No results for input(s): "PROBNP" in the last 8760 hours.   D-Dimer No results for input(s): "DDIMER" in the last 72 hours. Hemoglobin A1C No results for input(s): "HGBA1C" in the last 72 hours.  Fasting Lipid Panel No results for input(s): "CHOL", "HDL", "LDLCALC", "TRIG", "CHOLHDL", "LDLDIRECT" in the last 72 hours. Thyroid Function Tests No results for input(s): "TSH", "T4TOTAL", "T3FREE", "THYROIDAB" in the last 72 hours.  Invalid input(s): "FREET3"  Other results:   Imaging    DG CHEST PORT 1 VIEW  Result Date: 11/18/2022 CLINICAL DATA:  Respiratory failure EXAM: PORTABLE CHEST 1 VIEW COMPARISON:  11/18/2022 FINDINGS: Endotracheal tube seen 15 mm above the carina. Nasoenteric feeding tube extends into the upper abdomen beyond the margin of the examination. Left internal jugular central venous catheter tip noted at the superior cavoatrial  junction. Pulmonary insufflation is normal and stable. Bibasilar pulmonary infiltrates are stable. Small right pleural effusion is unchanged. No pneumothorax. Cardiac size within normal limits. Left subclavian dual lead pacemaker unchanged. Pulmonary vascularity is normal. Surgical clips noted at the thoracic inlet. No acute bone abnormality. IMPRESSION: 1. Support lines and tubes in appropriate position. 2. Stable bibasilar pulmonary infiltrates and small right pleural effusion. Electronically Signed   By: Fidela Salisbury M.D.   On: 11/18/2022 20:02   DG CHEST PORT 1 VIEW  Result Date: 11/18/2022 CLINICAL DATA:  Hypoxemia EXAM: PORTABLE CHEST 1 VIEW COMPARISON:  11/18/2022 FINDINGS: Left pacer, left central line, and feeding tube remain in place, unchanged. Heart and mediastinal contours are within normal limits. Bibasilar airspace opacities, new since prior study. No visible effusions or pneumothorax. No acute bony abnormality. IMPRESSION: Bibasilar airspace opacities could reflect atelectasis or infiltrates/pneumonia. Electronically Signed   By: Rolm Baptise M.D.   On: 11/18/2022 19:36     Medications:     Scheduled Medications:  Chlorhexidine Gluconate Cloth  6 each Topical Daily   enoxaparin (LOVENOX) injection  30 mg Subcutaneous Q24H   feeding supplement (PROSource TF20)  60 mL Per Tube Daily   insulin aspart  0-9 Units Subcutaneous Q4H   levothyroxine  112 mcg Per Tube QAC breakfast   mouth rinse  15 mL Mouth Rinse Q2H   pantoprazole (PROTONIX) IV  40 mg Intravenous QHS   polyethylene glycol  17 g Per Tube Daily   potassium chloride  40 mEq Per Tube BID   senna  1 tablet Per Tube QHS   sodium chloride flush  3 mL Intravenous Q12H   sucralfate  1 g Per Tube BID   thiamine  100 mg Per Tube Daily    Infusions:  sodium chloride     dexmedetomidine (PRECEDEX) IV infusion 1.4 mcg/kg/hr (11/19/22 1015)   feeding supplement (VITAL 1.5 CAL) 40 mL/hr at 11/19/22 1000   fentaNYL infusion  INTRAVENOUS 25 mcg/hr (11/19/22 1000)   norepinephrine (LEVOPHED) Adult infusion 10 mcg/min (11/19/22 1000)   potassium chloride 10 mEq (11/19/22 1016)    PRN Medications: sodium chloride, acetaminophen, docusate, fentaNYL (SUBLIMAZE) injection, fentaNYL (SUBLIMAZE) injection, guaiFENesin, ipratropium-albuterol, melatonin, midazolam, mouth rinse, mouth rinse, mouth  rinse, polyethylene glycol, sodium chloride flush    Patient Profile   52 y.o. female with a hx of tobacco use, COPD, myotonic dystrophy type I since age 7, high grade heart block s/p PPM, RBBB, sickle cell trait, and DM1 (questionable diagnosis) and strong family history of heart failure (father s/p cardiac transplant and paternal aunt w/ NICM w/ LVAD), admitted w/ acute hypoxic respiratory failure/ LLL CAP secondary to metapneumovirus. Hospital course c/b VF arrest. Post arrest echo w/ severe BiV failure, LVEF < 20%, RV severely reduced + diffuse HK   Assessment/Plan    1. VF arrest: 3/12, shocked. 15 minutes CPR total.  - Due to long-short interval (R-on-T) in the setting of QT-prolonging drugs (azithro) and hypokalemia - Pacemaker lower rate limit has been increased to prevent pause-dependent TdP; this should be reprogrammed to a more physiologic rate if/when approaching discharge. - Per EP: may require upgrade with LV lead +/-upgrade to primary prevention ICD if EF does not recover with GDMT. VT this admission was attributable to a reversible cause, so ICD not indicated urgently for secondary prevention. Can consider placement of LifeVest on discharge - Keep K> 4.0 Mg > 2.0 - avoid QT prolonging agents  2. Acute hypoxemic respiratory failure: Intubated post-VF arrest.  Had viral PNA with metapneumovirus at presentation.  - extubated 3/14 Reintubated 3/16 - CCM managing. Suspect she has significant respiratory muscle weakness in setting of myotonic dystrophy  3. Acute systolic CHF: Prior workup for cardiac sarcoidosis.  Cardiac MRI in 2017 showed subendocardial LGE in the mid inferior and inferolateral walls with EF 56%.  Cardiac PET done after this was NOT suggestive of cardiac sarcoidosis.  - Echo in 5/22 with normal EF.   - Echo this admission reviewed, EF 20-25% range with diffuse hypokinesis but relatively preserved function at the apex, moderate RV dysfunction.  - cath 3/15 no CAD - suspect due to myotonic dystrophy type 1 -> she has an aunt with LVAD who has cardiomyopathy due to myotonic dystrophy.  Father had a heart transplant, uncertain of underlying condition. R/LHC c/w Nonischemic cardiomyopathy, - Continue NE for BP/inotropic support - Given overall debility, she is not currently a candidate for advanced therapies - Would consider palliative care input    4. High grade heart block: Has MDT PPM with His bundle lead.  This is likely due to cardiac involvement of myotonic dystrophy type 1.  - plan as above  5. Myotonic dystrophy: Type 1, diagnosed at age 37. Has family members with the same diagnosis.  Followed by neurology.   6. Tobacco abuse - needs cessation  7. Hypokalemia/hypomag - supp  CRITICAL CARE Performed by: Glori Bickers  Total critical care time: 35 minutes  Critical care time was exclusive of separately billable procedures and treating other patients.  Critical care was necessary to treat or prevent imminent or life-threatening deterioration.  Critical care was time spent personally by me (independent of midlevel providers or residents) on the following activities: development of treatment plan with patient and/or surrogate as well as nursing, discussions with consultants, evaluation of patient's response to treatment, examination of patient, obtaining history from patient or surrogate, ordering and performing treatments and interventions, ordering and review of laboratory studies, ordering and review of radiographic studies, pulse oximetry and re-evaluation of patient's  condition.   Length of Stay: [place], MD  11/19/2022, 11:03 AM  Advanced Heart Failure Team Pager [phone] (M-F; 7a - 5p)  Please contact [facility] for night-coverage after hours (5p -  7a ) and weekends on [email or web address]

## 2022-11-19 NOTE — Progress Notes (Signed)
NAME:  Cynthia Underwood, MRN:  [id number], DOB:  Apr 09, 1971, LOS: 5 ADMISSION DATE:  11/11/2022 CONSULTATION DATE:  11/14/2022 REFERRING MD:  Wyline Copas - TRH CHIEF COMPLAINT:  Vfib arrest on floor   History of Present Illness:   52 year old woman who initially presented to [facility] 3/9 for cough and progressive dyspnea x 4 days. Also reported diarrhea and 1 episode of epigastric pain with vomiting. PMHx significant for RBBB/high-grade heart block (s/p PPM), hypothyroidism, sickle cell trait, myotonic dystrophy (dx age 65), tobacco use.  Noted to be hypoxic in ED with O2 sat 88% on RA, CTA Chest c/f LLL PNA with mild bronchiectasis, negative for PE. Also c/f esophagitis. Patient was empirically started on CAP coverage (ceftriaxone, azithromycin) and GERD treatment (PPI/sucralfate). Also found to be positive for human metapneumovirus. Admitted to [facility].  Patient was initially progressing as expected 3/11-3/12 until ~1400, sat was noted to be in 80s. Placed on 5L humidified oxygen with slight improvement in sats. Solumedrol and DuoNebs were ordered. Around 1900 patient's RN was informed by telemetry of VF and was found unresponsive and pulseless. Code Blue was called with CPR x 15 minutes and ROSC. Amiodarone, Epi and defib were administered. Patient was intubated by anesthesia peri-code. IO was placed and Levophed was initiated. Cardiology was consulted.  PCCM was consulted for ICU admission post-arrest.  Pertinent Medical History:   Past Medical History:  Diagnosis Date   Bifascicular block 10/02/2014   Cataract    Dyspnea    Excess ear wax    Multinodular goiter    Pneumonia    Presence of permanent cardiac pacemaker    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   Sickle cell trait (HCC)    Watery eyes    Left   Weakness of both legs    Significant Hospital Events: Including procedures, antibiotic start and stop dates in addition to other pertinent events    3/9 - Presented to [facility] for LLL PNA. Started on Surveyor, quantity. +human metapneumovirus. 3/12 - Hypoxic requiring 5LNC. Later VF arrest on floor, CPR 3/13 echocardiogram LVEF severely depressed 50% (decreased compared with 01/2021), severely reduced RV function 3/14 Cardiac catheterization showed normal coronaries, new normal filling pressures and normal PA pressure.  With excellent cardiac output 3/15 Extubated.  3/16 Reintubated for increased work of breathing, failed BiPAP  Interim History / Subjective:   Reintubated overnight for increased work of breathing, failed BiPAP  Objective:  Blood pressure 93/64, pulse 89, temperature 97.9 F (36.6 C), temperature source Axillary, resp. rate (!) 24, height 5\' 3"  (1.6 m), weight 44.6 kg, last menstrual period 12/03/2016, SpO2 94 %. CVP:  [6 mmHg-8 mmHg] 6 mmHg  Vent Mode: PRVC FiO2 (%):  [60 %-100 %] 60 % Set Rate:  [24 bmp] 24 bmp Vt Set:  [410 mL-420 mL] 410 mL PEEP:  [5 [id number] cmH20] 5 cmH20 Plateau Pressure:  [18 cmH20-35 cmH20] 18 cmH20   Intake/Output Summary (Last 24 hours) at 11/19/2022 1055 Last data filed at 11/19/2022 1000 Gross per 24 hour  Intake 2110.04 ml  Output 1650 ml  Net 460.04 ml   Filed Weights   11/11/22 1211 11/18/22 0500 11/19/22 0500  Weight: 44.5 kg 45.2 kg 44.6 kg   Physical Examination: Blood pressure (!) 130/104, pulse 89, temperature 98.1 F (36.7 C), temperature source Axillary, resp. rate 20, height 5\' 3"  (1.6 m), weight 45.2 kg, last menstrual period 12/03/2016, SpO2 99 %. Gen:      No acute distress, frail,  malnourished HEENT:  EOMI, sclera anicteric Neck:     No masses; no thyromegaly, ET tube Lungs:    Clear to auscultation bilaterally; normal respiratory effort CV:         Regular rate and rhythm; no murmurs Abd:      + bowel sounds; soft, non-tender; no palpable masses, no distension Ext:    No edema; adequate peripheral perfusion Skin:      Warm and dry; no rash Neuro:  Sedated  Labs/imaging reviewed Potassium 2.4, BUN/creatinine 15/0.80 Chest x-ray with stable bibasal opacities  Resolved Hospital Problem List:    Assessment & Plan:  Undifferentiated shock, ?septic vs. cardiogenic post-arrest.  Suspect in part due to need for sedation. Stable off pressors.  Continue telemonitoring.  Post-Vfib arrest Troponinemia History of RBBB and PPM implantation New cardiomyopathy, LVEF 20% Cardiac cath shows nonischemic cardiomyopathy with clean coronaries, normal filling pressures and no evidence of pulmonary hypertension Cardiology and EP are following  Acute hypoxemic respiratory failure LLL PNA +Human metapneumovirus infection Suspect she may have some respiratory muscle weakness from myotonic dystrophy.   Now reintubated.  Will allow her to rest overnight Briefly brought up tracheostomy with family but they would like to give her another chance at weaning. Completed azithromycin and ceftriaxone Steroids discontinued 3/14 Follow intermittent chest x-ray  Acute metabolic encephalopathy Multifactorial in the setting of post-arrest, metabolic derangements/acidosis, hypoxia, sedation. Correct underlying metabolic derangements  Shock liver, improved Likely in the setting of VF arrest with transaminitis post-code. Follow LFT, coags intermittently  Hyperglycemia No history of DM per family. Sliding scale insulin ordered, CBGs Goal CBG <180  GERD with esophagitis Continue PPI  Hypothyroidism Continue levothyroxine  Constipation Bowel regimen  Best Practice: (right click and "Reselect all SmartList Selections" daily)   Diet/type: NPO w/ meds via tube DVT prophylaxis: Lovenox GI prophylaxis: PPI Lines: Central line and Arterial Line Foley:  N/A Code Status:  full code Last date of multidisciplinary goals of care discussion [Family updated at bedside daily, 3/17]  Critical care time:    The patient is critically ill with multiple organ  system failure and requires high complexity decision making for assessment and support, frequent evaluation and titration of therapies, advanced monitoring, review of radiographic studies and interpretation of complex data.   Critical Care Time devoted to patient care services, exclusive of separately billable procedures, described in this note is 35 minutes.   Marshell Garfinkel MD  Pulmonary & Critical care See Amion for pager  If no response to pager , please call [phone] until 7pm After 7:00 pm call Elink  [phone] 11/19/2022, 10:55 AM

## 2022-11-20 DIAGNOSIS — I469 Cardiac arrest, cause unspecified: Secondary | ICD-10-CM | POA: Diagnosis not present

## 2022-11-20 DIAGNOSIS — J189 Pneumonia, unspecified organism: Secondary | ICD-10-CM | POA: Diagnosis not present

## 2022-11-20 DIAGNOSIS — I5021 Acute systolic (congestive) heart failure: Secondary | ICD-10-CM | POA: Diagnosis not present

## 2022-11-20 LAB — CBC
HCT: 27.6 % — ABNORMAL LOW (ref 36.0–46.0)
Hemoglobin: 9.1 g/dL — ABNORMAL LOW (ref 12.0–15.0)
MCH: 31.9 pg (ref 26.0–34.0)
MCHC: 33 g/dL (ref 30.0–36.0)
MCV: 96.8 fL (ref 80.0–100.0)
Platelets: 239 10*3/uL (ref 150–400)
RBC: 2.85 MIL/uL — ABNORMAL LOW (ref 3.87–5.11)
RDW: 16 % — ABNORMAL HIGH (ref 11.5–15.5)
WBC: 13.1 10*3/uL — ABNORMAL HIGH (ref 4.0–10.5)
nRBC: 0 % (ref 0.0–0.2)

## 2022-11-20 LAB — GLUCOSE, CAPILLARY
Glucose-Capillary: 117 mg/dL — ABNORMAL HIGH (ref 70–99)
Glucose-Capillary: 125 mg/dL — ABNORMAL HIGH (ref 70–99)
Glucose-Capillary: 126 mg/dL — ABNORMAL HIGH (ref 70–99)
Glucose-Capillary: 142 mg/dL — ABNORMAL HIGH (ref 70–99)
Glucose-Capillary: 161 mg/dL — ABNORMAL HIGH (ref 70–99)
Glucose-Capillary: 191 mg/dL — ABNORMAL HIGH (ref 70–99)

## 2022-11-20 LAB — COMPREHENSIVE METABOLIC PANEL
ALT: 34 U/L (ref 0–44)
AST: 21 U/L (ref 15–41)
Albumin: 2.1 g/dL — ABNORMAL LOW (ref 3.5–5.0)
Alkaline Phosphatase: 53 U/L (ref 38–126)
Anion gap: 12 (ref 5–15)
BUN: 14 mg/dL (ref 6–20)
CO2: 22 mmol/L (ref 22–32)
Calcium: 8.5 mg/dL — ABNORMAL LOW (ref 8.9–10.3)
Chloride: 111 mmol/L (ref 98–111)
Creatinine, Ser: 0.74 mg/dL (ref 0.44–1.00)
GFR, Estimated: >60 mL/min (ref >60)
Glucose, Bld: 139 mg/dL — ABNORMAL HIGH (ref 70–99)
Potassium: 3.2 mmol/L — ABNORMAL LOW (ref 3.5–5.1)
Sodium: 145 mmol/L (ref 135–145)
Total Bilirubin: 0.3 mg/dL (ref 0.3–1.2)
Total Protein: 6 g/dL — ABNORMAL LOW (ref 6.5–8.1)

## 2022-11-20 LAB — COOXEMETRY PANEL
Carboxyhemoglobin: 0.8 % (ref 0.5–1.5)
Methemoglobin: <0.7 % (ref 0.0–1.5)
O2 Saturation: 64.3 %
Total hemoglobin: 9.4 g/dL — ABNORMAL LOW (ref 12.0–16.0)

## 2022-11-20 LAB — PHOSPHORUS: Phosphorus: 2.1 mg/dL — ABNORMAL LOW (ref 2.5–4.6)

## 2022-11-20 LAB — MAGNESIUM: Magnesium: 2.2 mg/dL (ref 1.7–2.4)

## 2022-11-20 MED ORDER — POTASSIUM CHLORIDE 10 MEQ/50ML IV SOLN: 10.0000 meq | INTRAVENOUS | Status: DC

## 2022-11-20 MED ORDER — FENTANYL BOLUS VIA INFUSION
50.0000 ug | INTRAVENOUS | Status: DC | PRN
  Administered 2022-11-21 – 2022-11-22 (×12): 100 ug via INTRAVENOUS
  Administered 2022-11-23: 25 ug via INTRAVENOUS
  Administered 2022-11-23 (×3): 100 ug via INTRAVENOUS
  Administered 2022-11-25 (×4): 50 ug via INTRAVENOUS
  Administered 2022-11-25: 25 ug via INTRAVENOUS
  Administered 2022-11-26 – 2022-11-27 (×3): 50 ug via INTRAVENOUS
  Administered 2022-11-27: 75 ug via INTRAVENOUS
  Administered 2022-11-27 – 2022-11-28 (×2): 100 ug via INTRAVENOUS
  Administered 2022-11-28: 75 ug via INTRAVENOUS

## 2022-11-20 MED ORDER — VITAL 1.5 CAL PO LIQD
1000.0000 mL | ORAL | Status: DC
  Administered 2022-11-20 – 2022-11-28 (×5): 1000 mL
  Filled 2022-11-20 (×2): qty 1000

## 2022-11-20 MED ORDER — SODIUM PHOSPHATES 45 MMOLE/15ML IV SOLN
15.0000 mmol | Freq: Once | INTRAVENOUS | Status: AC
  Administered 2022-11-20: 15 mmol via INTRAVENOUS
  Filled 2022-11-20: qty 5

## 2022-11-20 MED ORDER — POTASSIUM CHLORIDE 20 MEQ PO PACK: 20.0000 meq | PACK | ORAL | Status: DC

## 2022-11-20 NOTE — Progress Notes (Signed)
Pharmacy Electrolyte Replacement  Recent Labs:  Recent Labs    11/20/22 0421  K 3.2*  MG 2.2  PHOS 2.1*  CREATININE 0.74    Low Critical Values (K </= 2.5, Phos </= 1, Mg </= 1) Present: None  MD Contacted: Dr. Silas Flood  Plan: sodium phosphate 15 mmol IV x 1  Eliseo Gum, PharmD PGY1 Pharmacy Resident   11/20/2022  9:08 AM

## 2022-11-20 NOTE — Progress Notes (Addendum)
NAME:  Cynthia Underwood, MRN:  [id number], DOB:  Jan 23, 1971, LOS: 6 ADMISSION DATE:  11/11/2022 CONSULTATION DATE:  11/14/2022 REFERRING MD:  Wyline Copas - TRH CHIEF COMPLAINT:  Vfib arrest on floor   History of Present Illness:   52 year old woman who initially presented to [facility] 3/9 for cough and progressive dyspnea x 4 days. Also reported diarrhea and 1 episode of epigastric pain with vomiting. PMHx significant for RBBB/high-grade heart block (s/p PPM), hypothyroidism, sickle cell trait, myotonic dystrophy (dx age 87), tobacco use.  Noted to be hypoxic in ED with O2 sat 88% on RA, CTA Chest c/f LLL PNA with mild bronchiectasis, negative for PE. Also c/f esophagitis. Patient was empirically started on CAP coverage (ceftriaxone, azithromycin) and GERD treatment (PPI/sucralfate). Also found to be positive for human metapneumovirus. Admitted to [facility].  Patient was initially progressing as expected 3/11-3/12 until ~1400, sat was noted to be in 80s. Placed on 5L humidified oxygen with slight improvement in sats. Solumedrol and DuoNebs were ordered. Around 1900 patient's RN was informed by telemetry of VF and was found unresponsive and pulseless. Code Blue was called with CPR x 15 minutes and ROSC. Amiodarone, Epi and defib were administered. Patient was intubated by anesthesia peri-code. IO was placed and Levophed was initiated. Cardiology was consulted.  PCCM was consulted for ICU admission post-arrest.  Pertinent Medical History:   Past Medical History:  Diagnosis Date   Bifascicular block 10/02/2014   Cataract    Dyspnea    Excess ear wax    Multinodular goiter    Pneumonia    Presence of permanent cardiac pacemaker    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   Sickle cell trait (HCC)    Watery eyes    Left   Weakness of both legs    Significant Hospital Events: Including procedures, antibiotic start and stop dates in addition to other pertinent events    3/9 - Presented to [facility] for LLL PNA. Started on Surveyor, quantity. +human metapneumovirus. 3/12 - Hypoxic requiring 5LNC. Later VF arrest on floor, CPR 3/13 echocardiogram LVEF severely depressed 50% (decreased compared with 01/2021), severely reduced RV function 3/14 Cardiac catheterization showed normal coronaries, new normal filling pressures and normal PA pressure.  With excellent cardiac output 3/15 Extubated.  3/16 Reintubated for increased work of breathing, failed BiPAP 3/18 alert, interactive off vasopressors  Interim History / Subjective:   No acute events, weaned off pressors per RN report, despite flowsheet data  Objective:  Blood pressure (!) 111/91, pulse 90, temperature 97.7 F (36.5 C), temperature source Axillary, resp. rate (!) 25, height 5\' 3"  (1.6 m), weight 43.8 kg, last menstrual period 12/03/2016, SpO2 99 %. CVP:  [5 mmHg-8 mmHg] 5 mmHg  Vent Mode: PRVC FiO2 (%):  [40 %] 40 % Set Rate:  [24 bmp] 24 bmp Vt Set:  [410 mL] 410 mL PEEP:  [5 cmH20] 5 cmH20 Plateau Pressure:  [18 cmH20-19 cmH20] 19 cmH20   Intake/Output Summary (Last 24 hours) at 11/20/2022 1211 Last data filed at 11/20/2022 1200 Gross per 24 hour  Intake 2741.82 ml  Output 1000 ml  Net 1741.82 ml    Filed Weights   11/18/22 0500 11/19/22 0500 11/20/22 0402  Weight: 45.2 kg 44.6 kg 43.8 kg   Physical Examination: BP (!) 111/91 (BP Location: Right Arm)   Pulse 90   Temp 97.7 F (36.5 C) (Axillary)   Resp (!) 25   Ht 5\' 3"  (1.6 m)   Wt 43.8  kg   LMP 12/03/2016 (Approximate)   SpO2 99%   BMI 17.11 kg/m  Gen:      No acute distress, frail, malnourished HEENT:  EOMI, sclera anicteric Neck:     No masses; no thyromegaly, ET tube Lungs:    Coarse ventilated sounds CV:         Regular rate and rhythm; no murmurs Abd:      + bowel sounds; soft, non-tender; no palpable masses, no distension Ext:    No edema; adequate peripheral perfusion Skin:      Warm and dry; no rash Neuro:  Sedated   Resolved Hospital Problem List:    Assessment & Plan:  Undifferentiated shock, ?septic vs. cardiogenic post-arrest.  Suspect in part due to need for sedation. --Now off vasopressors -- minimize sedatives  Post-Vfib arrest Troponinemia History of RBBB and PPM implantation New cardiomyopathy, LVEF 20% --Cardiac cath shows nonischemic cardiomyopathy with clean coronaries, normal filling pressures and no evidence of pulmonary hypertension --Cardiology and EP are following  Acute hypoxemic respiratory failure LLL PNA +Human metapneumovirus infection --Reintubated 3/16 after failing BiPAP, likely due to neuromuscular weakness in the setting of myotonic dystrophy worsened in the setting of myopathy of critical illness -- Consider another trial of extubation versus tracheostomy if within goals of care  Acute metabolic encephalopathy --Multifactorial in the setting of post-arrest, metabolic derangements/acidosis, hypoxia, sedation. --Now improved  Shock liver, Resolved -- likely in the setting of VF arrest with transaminitis post-code.  Hyperglycemia --Sliding scale insulin   GERD with esophagitis -- Continue PPI  Hypothyroidism -- Continue levothyroxine  Constipation -- Bowel regimen  Best Practice: (right click and "Reselect all SmartList Selections" daily)   Diet/type: NPO w/ meds via tube DVT prophylaxis: Lovenox GI prophylaxis: PPI Lines: Central line and Arterial Line Foley:  N/A Code Status:  full code Last date of multidisciplinary goals of care discussion [Family updated at bedside, 3/17]  Critical care time:    CRITICAL CARE Performed by: Lanier Clam   Total critical care time: 35 minutes  Critical care time was exclusive of separately billable procedures and treating other patients.  Critical care was necessary to treat or prevent imminent or life-threatening deterioration.  Critical care was time spent personally by me on the  following activities: development of treatment plan with patient and/or surrogate as well as nursing, discussions with consultants, evaluation of patient's response to treatment, examination of patient, obtaining history from patient or surrogate, ordering and performing treatments and interventions, ordering and review of laboratory studies, ordering and review of radiographic studies, pulse oximetry and re-evaluation of patient's condition.   Lanier Clam, MD [place] See Amion for pager  If no response to pager , please call [phone] until 7pm After 7:00 pm call Elink  [phone] 11/20/2022, 12:11 PM

## 2022-11-20 NOTE — Progress Notes (Addendum)
Patient Name: Cynthia Underwood Date of Encounter: 11/20/2022  Primary Cardiologist: Fransico Him, MD Electrophysiologist: Virl Axe, MD  Interval Summary   Reintubated 3/16 evening.  Mom states they weaned her settings further this am.   No further VT  Pt is awake and intubated. Nods head to questioning.   Inpatient Medications    Scheduled Meds:  Chlorhexidine Gluconate Cloth  6 each Topical Daily   enoxaparin (LOVENOX) injection  30 mg Subcutaneous Q24H   feeding supplement (PROSource TF20)  60 mL Per Tube Daily   insulin aspart  0-9 Units Subcutaneous Q4H   levothyroxine  112 mcg Per Tube QAC breakfast   mouth rinse  15 mL Mouth Rinse Q2H   pantoprazole (PROTONIX) IV  40 mg Intravenous QHS   polyethylene glycol  17 g Per Tube Daily   potassium chloride  40 mEq Per Tube BID   senna  1 tablet Per Tube QHS   sodium chloride flush  3 mL Intravenous Q12H   sucralfate  1 g Per Tube BID   thiamine  100 mg Per Tube Daily   Continuous Infusions:  sodium chloride     dexmedetomidine (PRECEDEX) IV infusion 1.6 mcg/kg/hr (11/20/22 0522)   feeding supplement (VITAL 1.5 CAL) 40 mL/hr at 11/19/22 1800   fentaNYL infusion INTRAVENOUS 150 mcg/hr (11/20/22 0521)   norepinephrine (LEVOPHED) Adult infusion 10 mcg/min (11/19/22 1800)   PRN Meds: sodium chloride, acetaminophen, docusate, fentaNYL (SUBLIMAZE) injection, fentaNYL (SUBLIMAZE) injection, guaiFENesin, ipratropium-albuterol, melatonin, midazolam, mouth rinse, mouth rinse, mouth rinse, polyethylene glycol, sodium chloride flush   Vital Signs    Vitals:   11/20/22 0600 11/20/22 0700 11/20/22 0803 11/20/22 0804  BP: 119/86 128/89    Pulse: 89 89    Resp: (!) 24 (!) 24    Temp:   97.8 F (36.6 C)   TempSrc:   Axillary   SpO2: 99% 97%  99%  Weight:      Height:        Intake/Output Summary (Last 24 hours) at 11/20/2022 0837 Last data filed at 11/20/2022 0545 Gross per 24 hour  Intake 1325.4 ml  Output 1200 ml  Net  125.4 ml   Filed Weights   11/18/22 0500 11/19/22 0500 11/20/22 0402  Weight: 45.2 kg 44.6 kg 43.8 kg    Physical Exam    GEN- The patient is intubated but awake. Nods heads to questions.  Lungs- + mechanical breathing sound Cardiac- Regular rate and rhythm, no murmurs, rubs or gallops GI- soft, NT, ND, + BS Extremities- no clubbing or cyanosis. 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 female with a history of Myotonic dystrophy (genetically confirmed), bifascicular block, advanced AV block s/p MDT DDD PPM, anemia, Goiter s/p thyroidectomy, Sickle cell trait, who is being seen today for the evaluation of VT/VF arrest at the request of Dr. Harrington Challenger.   Felt to have torsades in the setting of hypokalemia and QT prolongation on azithromycin.   Assessment & Plan    Polymorphic VT Due to long-short interval (R-on-T) in the setting of QT prolonging drugs and hypokalemia PPM LRL increased to 90 bpm. Will re-program to a more physiologic rate prior to discharge.  No further.  QT today appears 480-490 when corrected for QRS  Cardiomyopathy TTE this admission EF 20-25%, compared to TTE 01/2021 with normal EF ? If pacing induced cardiomyopathy could have contributed GDMT being introduced by  CHF team  Myotonic dystrophy  MDT PPM May need to consider upgrade with LV lead +/- ICD for primary prevention if EF does not recover. In setting of reversible cause of her PMVT, not urgent indication for secondary prevention  Hypoxic respiratory failure Reintubated, awake.  Per primary team.   For questions or updates, please contact [place] Please consult [email or web address] for contact info under Cardiology/STEMI.  Signed, Shirley Friar, PA-C  11/20/2022, 8:37 AM

## 2022-11-20 NOTE — TOC Initial Note (Signed)
Transition of Care [facility]) - Initial/Assessment Note    Patient Details  Name: Cynthia Underwood MRN: [id number] Date of Birth: 1970-10-02  [facility]) CM/SW Contact:    Erenest Rasher, RN Phone Number: [phone] 11/20/2022, 6:00 PM  Clinical Narrative:                 CM received referral for LTAC. Contacted pt's sister, Deedra Ehrich and mother. Sister states she would like to discuss with physician LTAC before making referral to [facility]. Provided CM contact information to sister.   Expected Discharge Plan: Long Term Acute Care (LTAC) Barriers to Discharge: Continued Medical Work up   Patient Goals and CMS Choice Patient states their goals for this hospitalization and ongoing recovery are:: wants sister to recover          Expected Discharge Plan and Services   Discharge Planning Services: CM Consult Post Acute Care Choice: Long Term Acute Care (LTAC)                                        Prior Living Arrangements/Services   Lives with:: Relatives Patient language and need for interpreter reviewed:: Yes              Criminal Activity/Legal Involvement Pertinent to Current Situation/Hospitalization: No - Comment as needed  Activities of Daily Living   ADL Screening (condition at time of admission) Patient's cognitive ability adequate to safely complete daily activities?: Yes  Permission Sought/Granted Permission sought to share information with : Case Manager    Share Information with NAME: Williams Pizzi HCPOA     Permission granted to share info w Relationship: sister  Permission granted to share info w Contact Information: [phone]  Emotional Assessment   Attitude/Demeanor/Rapport: Intubated (Following Commands or Not Following Commands)          Admission diagnosis:  CAP (community acquired pneumonia) [J18.9] Esophagitis [K20.90] Acute hypoxemic respiratory failure ([place]) [J96.01] Community acquired pneumonia, unspecified  laterality [J18.9] Patient Active Problem List   Diagnosis Date Noted   Drug-induced torsades de pointes ([place]) [id number]   Acute systolic CHF (congestive heart failure) ([place]) 11/16/2022   Acute hypoxemic respiratory failure ([place]) 11/15/2022   Acute on chronic combined systolic and diastolic CHF (congestive heart failure) ([place]) 11/15/2022   Protein-calorie malnutrition, severe 11/15/2022   Cardiac arrest ([place]) 11/14/2022   CAP (community acquired pneumonia) 11/11/2022   Hypothyroidism 11/07/2021   Thyroid nodule 04/21/2021   S/P total thyroidectomy 04/21/2021   Sinus node dysfunction ([place]) 03/10/2020   Weakness of both legs    Watery eyes    Sickle cell trait (HCC)    Presence of permanent cardiac pacemaker    Excess ear wax    Depression    Anemia    Cardiac pacemaker in situ 03/01/2017   Mobitz type 2 second degree heart block 02/15/2017   Symptomatic advanced heart block 02/12/2017   SOB (shortness of breath) 10/02/2014   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   Bifascicular block 10/02/2014   Infection due to trichomonas vaginalis 09/15/2014   Myotonic dystrophy, 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   PCP:  Charlott Rakes, MD Pharmacy:   [id number] [employer], [place] [id number] [place] [id number] Phone: [phone] Fax: [phone]     Social Determinants of Health (  SDOH) Social History: SDOH Screenings   Food Insecurity: No Food Insecurity (10/16/2022)  Housing: Low Risk  (10/16/2022)  Transportation Needs: No Transportation Needs (10/16/2022)  Utilities: Not At Risk (10/16/2022)  Alcohol Screen: Low Risk  (10/16/2022)  Depression (PHQ2-9): Medium Risk (11/08/2022)  Financial Resource Strain: Low Risk  (10/16/2022)  Physical Activity: Inactive (10/16/2022)  Social Connections: Socially Isolated (10/16/2022)  Stress: Stress Concern Present (10/16/2022)   Tobacco Use: High Risk (11/17/2022)   SDOH Interventions:     Readmission Risk Interventions     No data to display

## 2022-11-20 NOTE — Progress Notes (Signed)
Nutrition Follow-up  DOCUMENTATION CODES:  Severe malnutrition in context of chronic illness, Underweight  INTERVENTION:  Tube feeding via cortrak tube: Vital 1.5 at 45 ml/h (1080 ml per day) Prosource TF20 60 ml 1x/d Provides 1700 kcal, 93 gm protein, 825 ml free water daily Thiamine 100mg  x 5 days for refeeding risk  NUTRITION DIAGNOSIS:  Severe Malnutrition related to chronic illness (myotonic dystrophy) as evidenced by severe fat depletion, severe muscle depletion. - remains applicable   GOAL:  Patient will meet greater than or equal to 90% of their needs - being met through TF  MONITOR:  TF tolerance, I & O's, Vent status, Labs, Weight trends  REASON FOR ASSESSMENT:  Ventilator, Consult Enteral/tube feeding initiation and management  ASSESSMENT:  Pt with hx of myotonic dystrophy and hypothyroidism s/p thyroidectomy presented to ED with worsening cough and diarrhea for 4 days PTA. Found to have pneumonia related to metapneumovirus  3/12 - code blue called for VF arrest, intubated and transferred to ICU 3/14 - right/left heart cath and coronary angiography 3/15 - extubated 3/16 - intubated  Pt remains intubated and sedated at this time. Required re-intubation on 3/16 due to neuromuscular weakness. MD to speak to family and pt about extubation trial and possible trach placement. Discussed with RN at rounds, TF infusing at goal and well tolerated.    MV: 9.8 L/min Temp (24hrs), Avg:98 F (36.7 C), Min:97.6 F (36.4 C), Max:98.9 F (37.2 C)   Intake/Output Summary (Last 24 hours) at 11/20/2022 1400 Last data filed at 11/20/2022 1300 Gross per 24 hour  Intake 2605.7 ml  Output 1000 ml  Net 1605.7 ml  Net IO Since Admission: 5,168.77 mL [11/20/22 1400]  Nutritionally Relevant Medications: Scheduled Meds:  feeding supplement (PROSource TF20)  60 mL Per Tube Daily   insulin aspart  0-9 Units Subcutaneous Q4H   pantoprazole (PROTONIX) IV  40 mg Intravenous QHS    polyethylene glycol  17 g Per Tube Daily   potassium chloride  40 mEq Per Tube BID   senna  1 tablet Per Tube QHS   sucralfate  1 g Per Tube BID   Continuous Infusions:  sodium chloride     feeding supplement (VITAL 1.5 CAL)     norepinephrine (LEVOPHED) Adult infusion 10 mcg/min (11/20/22 1000)   sodium phosphate 15 mmol in dextrose 5 % 250 mL infusion 43 mL/hr at 11/20/22 1000   PRN Meds: docusate, polyethylene glycol,   Labs Reviewed: K 3.2 Phosphorus 2.1 CBG ranges from 107-207 mg/dL over the last 24 hours  NUTRITION - FOCUSED PHYSICAL EXAM: Flowsheet Row Most Recent Value  Orbital Region Severe depletion  Upper Arm Region Severe depletion  Thoracic and Lumbar Region Severe depletion  Buccal Region Severe depletion  Temple Region Moderate depletion  Clavicle Bone Region Severe depletion  Clavicle and Acromion Bone Region Severe depletion  Scapular Bone Region Moderate depletion  Dorsal Hand Unable to assess  [mittens]  Patellar Region Severe depletion  Anterior Thigh Region Severe depletion  Posterior Calf Region Severe depletion  Edema (RD Assessment) None  Hair Reviewed  Eyes Reviewed  Mouth Reviewed  Skin Reviewed  Nails Unable to assess   Diet Order:   Diet Order             Diet NPO time specified  Diet effective now                   EDUCATION NEEDS:   Not appropriate for education at this time  Skin:  Skin Assessment: Reviewed RN Assessment  Last BM:  3/17 - type 7  Height:   Ht Readings from Last 1 Encounters:  11/11/22 5\' 3"  (1.6 m)    Weight:   Wt Readings from Last 1 Encounters:  11/20/22 43.8 kg    Ideal Body Weight:  52.3 kg  BMI:  Body mass index is 17.11 kg/m.  Estimated Nutritional Needs:  Kcal:  1600-1800 kcal/d Protein:  80-95 g/d Fluid:  >/=1.8L/d    Ranell Patrick, RD, LDN Clinical Dietitian RD pager # available in [facility]  After hours/weekend pager # available in [facility]

## 2022-11-20 NOTE — Progress Notes (Addendum)
Advanced Heart Failure Rounding Note  PCP-Cardiologist: Fransico Him, MD   Subjective:    Cath 3/14 c/w nonischemic cardiomyopathy, filling pressures minimally elevated on RHC. Excellent cardiac output by Fick.  Extubated 3/15. Reintubated (3/16)  Off pressors. CO-OX 64%.   Remains intubated.   Objective:   Weight Range: 43.8 kg Body mass index is 17.11 kg/m.   Vital Signs:   Temp:  [97.6 F (36.4 C)-98.9 F (37.2 C)] 97.8 F (36.6 C) (03/18 0803) Pulse Rate:  [88-109] 89 (03/18 0700) Resp:  [19-25] 24 (03/18 0700) BP: (86-131)/(64-101) 128/89 (03/18 0700) SpO2:  [94 %-100 %] 99 % (03/18 0804) FiO2 (%):  [40 %-60 %] 40 % (03/18 0804) Weight:  [43.8 kg] 43.8 kg (03/18 0402) Last BM Date : 11/19/22  Weight change: Filed Weights   11/18/22 0500 11/19/22 0500 11/20/22 0402  Weight: 45.2 kg 44.6 kg 43.8 kg    Intake/Output:   Intake/Output Summary (Last 24 hours) at 11/20/2022 0808 Last data filed at 11/20/2022 0545 Gross per 24 hour  Intake 1325.4 ml  Output 1200 ml  Net 125.4 ml     CVP 1 Physical Exam  General:  Intubated.  HEENT: ETT  Cortrak Neck: supple. no JVD. Carotids 2+ bilat; no bruits. No lymphadenopathy or thryomegaly appreciated. Cor: PMI nondisplaced. Regular rate & rhythm. No rubs, gallops or murmurs. Lungs: clear Abdomen: soft, nontender, nondistended. No hepatosplenomegaly. No bruits or masses. Good bowel sounds. Extremities: no cyanosis, clubbing, rash, edema Neuro: Intubated. Opens eyes.    Telemetry  A paced 90s   Labs    CBC Recent Labs    11/19/22 0450 11/20/22 0421  WBC 16.8* 13.1*  HGB 10.6* 9.1*  HCT 31.1* 27.6*  MCV 95.1 96.8  PLT 266 [id number]   Basic Metabolic Panel Recent Labs    11/19/22 0450 11/19/22 0500 11/19/22 1440 11/20/22 0421  NA 145  --   --  145  K 2.4*  --  4.0 3.2*  CL 108  --   --  111  CO2 25  --   --  22  GLUCOSE 178*  --   --  139*  BUN 15  --   --  14  CREATININE 0.80  --   --  0.74   CALCIUM 8.9  --   --  8.5*  MG  --  1.8  --  2.2  PHOS  --   --   --  2.1*   Liver Function Tests Recent Labs    11/18/22 0556 11/20/22 0421  AST 20 21  ALT 60* 34  ALKPHOS 37* 53  BILITOT 0.5 0.3  PROT 5.5* 6.0*  ALBUMIN 2.4* 2.1*   No results for input(s): "LIPASE", "AMYLASE" in the last 72 hours. Cardiac Enzymes No results for input(s): "CKTOTAL", "CKMB", "CKMBINDEX", "TROPONINI" in the last 72 hours.  BNP: BNP (last 3 results) Recent Labs    11/11/22 1238 11/17/22 2150  BNP 58.5 145.2*    ProBNP (last 3 results) No results for input(s): "PROBNP" in the last 8760 hours.   D-Dimer No results for input(s): "DDIMER" in the last 72 hours. Hemoglobin A1C No results for input(s): "HGBA1C" in the last 72 hours.  Fasting Lipid Panel No results for input(s): "CHOL", "HDL", "LDLCALC", "TRIG", "CHOLHDL", "LDLDIRECT" in the last 72 hours. Thyroid Function Tests No results for input(s): "TSH", "T4TOTAL", "T3FREE", "THYROIDAB" in the last 72 hours.  Invalid input(s): "FREET3"  Other results:   Imaging    No results found.  Medications:     Scheduled Medications:  Chlorhexidine Gluconate Cloth  6 each Topical Daily   enoxaparin (LOVENOX) injection  30 mg Subcutaneous Q24H   feeding supplement (PROSource TF20)  60 mL Per Tube Daily   insulin aspart  0-9 Units Subcutaneous Q4H   levothyroxine  112 mcg Per Tube QAC breakfast   mouth rinse  15 mL Mouth Rinse Q2H   pantoprazole (PROTONIX) IV  40 mg Intravenous QHS   polyethylene glycol  17 g Per Tube Daily   potassium chloride  40 mEq Per Tube BID   senna  1 tablet Per Tube QHS   sodium chloride flush  3 mL Intravenous Q12H   sucralfate  1 g Per Tube BID   thiamine  100 mg Per Tube Daily    Infusions:  sodium chloride     dexmedetomidine (PRECEDEX) IV infusion 1.6 mcg/kg/hr (11/20/22 0522)   feeding supplement (VITAL 1.5 CAL) 40 mL/hr at 11/19/22 1800   fentaNYL infusion INTRAVENOUS 150 mcg/hr (11/20/22  0521)   norepinephrine (LEVOPHED) Adult infusion 10 mcg/min (11/19/22 1800)    PRN Medications: sodium chloride, acetaminophen, docusate, fentaNYL (SUBLIMAZE) injection, fentaNYL (SUBLIMAZE) injection, guaiFENesin, ipratropium-albuterol, melatonin, midazolam, mouth rinse, mouth rinse, mouth rinse, polyethylene glycol, sodium chloride flush    Patient Profile   53 y.o. female with a hx of tobacco use, COPD, myotonic dystrophy type I since age 74, high grade heart block s/p PPM, RBBB, sickle cell trait, and DM1 (questionable diagnosis) and strong family history of heart failure (father s/p cardiac transplant and paternal aunt w/ NICM w/ LVAD), admitted w/ acute hypoxic respiratory failure/ LLL CAP secondary to metapneumovirus. Hospital course c/b VF arrest. Post arrest echo w/ severe BiV failure, LVEF < 20%, RV severely reduced + diffuse HK   Assessment/Plan    1. VF arrest: 3/12, shocked. 15 minutes CPR total.  - Due to long-short interval (R-on-T) in the setting of QT-prolonging drugs (azithro) and hypokalemia - Pacemaker lower rate limit has been increased to prevent pause-dependent TdP; this should be reprogrammed to a more physiologic rate if/when approaching discharge. - Per EP: may require upgrade with LV lead +/-upgrade to primary prevention ICD if EF does not recover with GDMT. VT this admission was attributable to a reversible cause, so ICD not indicated urgently for secondary prevention. Can consider placement of LifeVest on discharge - Keep K> 4.0 Mg > 2.0 - avoid QT prolonging agents  2. Acute hypoxemic respiratory failure: Intubated post-VF arrest.  Had viral PNA with metapneumovirus at presentation.  - extubated 3/14 Reintubated 3/16 - CCM managing.   3. Acute systolic CHF: Prior workup for cardiac sarcoidosis. Cardiac MRI in 2017 showed subendocardial LGE in the mid inferior and inferolateral walls with EF 56%.  Cardiac PET done after this was NOT suggestive of cardiac  sarcoidosis.  - Echo in 5/22 with normal EF.   - Echo this admission reviewed, EF 20-25% range with diffuse hypokinesis but relatively preserved function at the apex, moderate RV dysfunction.  - cath 3/15 no CAD - suspect due to myotonic dystrophy type 1 -> she has an aunt with LVAD who has cardiomyopathy due to myotonic dystrophy.  Father had a heart transplant, uncertain of underlying condition. R/LHC c/w Nonischemic cardiomyopathy.  - CVP 1. Does not need diuretics.  - Off pressors. No room fr GDMT.  - Given overall debility, she is not currently a candidate for advanced therapies - Would consider palliative care input    4. High grade heart block: Has  MDT PPM with His bundle lead.  This is likely due to cardiac involvement of myotonic dystrophy type 1.  - plan as above  5. Myotonic dystrophy: Type 1, diagnosed at age 66. Has family members with the same diagnosis.  Followed by neurology.   6. Tobacco abuse - needs cessation  7. Hypokalemia/hypomag - supp K     Length of Stay: Kimberly, NP  11/20/2022, 8:08 AM  Advanced Heart Failure Team Pager [phone] (M-F; 7a - 5p)  Please contact [facility] for night-coverage after hours (5p -7a ) and weekends on [email or web address]   Agree with above.  Remains intubated. Awake. Very weak but will follow commands.   On NE 10. Co-ox 64%  No further VT/VF with atrial pacing  General:  Weak appearing. Cachetic. Intubated. Awake  HEENT: normal + ETT Neck: supple. no JVD. Carotids 2+ bilat; no bruits. No lymphadenopathy or thryomegaly appreciated. Cor: Regular rate & rhythm. No rubs, gallops or murmurs. Lungs: clear Abdomen: soft, nontender, nondistended. No hepatosplenomegaly. No bruits or masses. Good bowel sounds. Extremities: no cyanosis, clubbing, rash, edema Neuro: awake on vent. Weak will follow commands  Remains tenuous. Major issue currently seems to be respiratory muscle weakness. Continue to wean vent per CCM. Wean NE as  tolerated (should be easier as sedation comes off)/ Continue a-pacing at 90 for now.   CRITICAL CARE Performed by: Glori Bickers  Total critical care time: 35 minutes  Critical care time was exclusive of separately billable procedures and treating other patients.  Critical care was necessary to treat or prevent imminent or life-threatening deterioration.  Critical care was time spent personally by me (independent of midlevel providers or residents) on the following activities: development of treatment plan with patient and/or surrogate as well as nursing, discussions with consultants, evaluation of patient's response to treatment, examination of patient, obtaining history from patient or surrogate, ordering and performing treatments and interventions, ordering and review of laboratory studies, ordering and review of radiographic studies, pulse oximetry and re-evaluation of patient's condition.  Glori Bickers, MD  9:07 AM

## 2022-11-21 DIAGNOSIS — I469 Cardiac arrest, cause unspecified: Secondary | ICD-10-CM | POA: Diagnosis not present

## 2022-11-21 DIAGNOSIS — J189 Pneumonia, unspecified organism: Secondary | ICD-10-CM | POA: Diagnosis not present

## 2022-11-21 DIAGNOSIS — J9601 Acute respiratory failure with hypoxia: Secondary | ICD-10-CM | POA: Diagnosis not present

## 2022-11-21 DIAGNOSIS — I5021 Acute systolic (congestive) heart failure: Secondary | ICD-10-CM | POA: Diagnosis not present

## 2022-11-21 LAB — CBC WITH DIFFERENTIAL/PLATELET
Abs Immature Granulocytes: 0.12 10*3/uL — ABNORMAL HIGH (ref 0.00–0.07)
Basophils Absolute: 0 10*3/uL (ref 0.0–0.1)
Basophils Relative: 0 %
Eosinophils Absolute: 0.4 10*3/uL (ref 0.0–0.5)
Eosinophils Relative: 3 %
HCT: 29.2 % — ABNORMAL LOW (ref 36.0–46.0)
Hemoglobin: 9.7 g/dL — ABNORMAL LOW (ref 12.0–15.0)
Immature Granulocytes: 1 %
Lymphocytes Relative: 11 %
Lymphs Abs: 1.4 10*3/uL (ref 0.7–4.0)
MCH: 32.6 pg (ref 26.0–34.0)
MCHC: 33.2 g/dL (ref 30.0–36.0)
MCV: 98 fL (ref 80.0–100.0)
Monocytes Absolute: 0.5 10*3/uL (ref 0.1–1.0)
Monocytes Relative: 4 %
Neutro Abs: 10.1 10*3/uL — ABNORMAL HIGH (ref 1.7–7.7)
Neutrophils Relative %: 81 %
Platelets: 267 10*3/uL (ref 150–400)
RBC: 2.98 MIL/uL — ABNORMAL LOW (ref 3.87–5.11)
RDW: 16.6 % — ABNORMAL HIGH (ref 11.5–15.5)
WBC: 12.6 10*3/uL — ABNORMAL HIGH (ref 4.0–10.5)
nRBC: 0.2 % (ref 0.0–0.2)

## 2022-11-21 LAB — CULTURE, RESPIRATORY W GRAM STAIN: Culture: NORMAL

## 2022-11-21 LAB — GLUCOSE, CAPILLARY
Glucose-Capillary: 119 mg/dL — ABNORMAL HIGH (ref 70–99)
Glucose-Capillary: 123 mg/dL — ABNORMAL HIGH (ref 70–99)
Glucose-Capillary: 131 mg/dL — ABNORMAL HIGH (ref 70–99)
Glucose-Capillary: 133 mg/dL — ABNORMAL HIGH (ref 70–99)
Glucose-Capillary: 142 mg/dL — ABNORMAL HIGH (ref 70–99)
Glucose-Capillary: 144 mg/dL — ABNORMAL HIGH (ref 70–99)

## 2022-11-21 LAB — COMPREHENSIVE METABOLIC PANEL
ALT: 35 U/L (ref 0–44)
AST: 29 U/L (ref 15–41)
Albumin: 2.2 g/dL — ABNORMAL LOW (ref 3.5–5.0)
Alkaline Phosphatase: 60 U/L (ref 38–126)
Anion gap: 10 (ref 5–15)
BUN: 20 mg/dL (ref 6–20)
CO2: 22 mmol/L (ref 22–32)
Calcium: 8.5 mg/dL — ABNORMAL LOW (ref 8.9–10.3)
Chloride: 116 mmol/L — ABNORMAL HIGH (ref 98–111)
Creatinine, Ser: 0.88 mg/dL (ref 0.44–1.00)
GFR, Estimated: >60 mL/min (ref >60)
Glucose, Bld: 120 mg/dL — ABNORMAL HIGH (ref 70–99)
Potassium: 4.1 mmol/L (ref 3.5–5.1)
Sodium: 148 mmol/L — ABNORMAL HIGH (ref 135–145)
Total Bilirubin: 0.6 mg/dL (ref 0.3–1.2)
Total Protein: 6.4 g/dL — ABNORMAL LOW (ref 6.5–8.1)

## 2022-11-21 LAB — BASIC METABOLIC PANEL
Anion gap: 9 (ref 5–15)
BUN: 21 mg/dL — ABNORMAL HIGH (ref 6–20)
CO2: 23 mmol/L (ref 22–32)
Calcium: 8.3 mg/dL — ABNORMAL LOW (ref 8.9–10.3)
Chloride: 122 mmol/L — ABNORMAL HIGH (ref 98–111)
Creatinine, Ser: 0.79 mg/dL (ref 0.44–1.00)
GFR, Estimated: >60 mL/min (ref >60)
Glucose, Bld: 100 mg/dL — ABNORMAL HIGH (ref 70–99)
Potassium: 3.9 mmol/L (ref 3.5–5.1)
Sodium: 154 mmol/L — ABNORMAL HIGH (ref 135–145)

## 2022-11-21 LAB — PHOSPHORUS: Phosphorus: 4.4 mg/dL (ref 2.5–4.6)

## 2022-11-21 LAB — COOXEMETRY PANEL
Carboxyhemoglobin: 1.1 % (ref 0.5–1.5)
Methemoglobin: 1.2 % (ref 0.0–1.5)
O2 Saturation: 80.1 %
Total hemoglobin: 9.9 g/dL — ABNORMAL LOW (ref 12.0–16.0)

## 2022-11-21 LAB — MAGNESIUM: Magnesium: 2.2 mg/dL (ref 1.7–2.4)

## 2022-11-21 MED ORDER — MAGNESIUM OXIDE -MG SUPPLEMENT 400 (240 MG) MG PO TABS
400.0000 mg | ORAL_TABLET | Freq: Every day | ORAL | Status: DC
  Administered 2022-11-21 – 2022-11-22 (×2): 400 mg
  Filled 2022-11-21 (×3): qty 1

## 2022-11-21 MED ORDER — POTASSIUM CHLORIDE 20 MEQ PO PACK
40.0000 meq | PACK | Freq: Every day | ORAL | Status: DC
  Administered 2022-11-22: 40 meq
  Filled 2022-11-21: qty 2

## 2022-11-21 MED ORDER — ALBUMIN HUMAN 25 % IV SOLN
25.0000 g | Freq: Once | INTRAVENOUS | Status: AC
  Administered 2022-11-21: 25 g via INTRAVENOUS
  Filled 2022-11-21: qty 100

## 2022-11-21 MED ORDER — FREE WATER
200.0000 mL | Status: DC
  Administered 2022-11-21 – 2022-11-23 (×13): 200 mL

## 2022-11-21 NOTE — Progress Notes (Addendum)
Advanced Heart Failure Rounding Note  PCP-Cardiologist: Fransico Him, MD   Subjective:    Cath 3/14 c/w nonischemic cardiomyopathy, filling pressures minimally elevated on RHC. Excellent cardiac output by Fick. Extubated 3/15. Reintubated (3/16)  CO-OX 80%   Remains intubated.   Objective:   Weight Range: 41.7 kg Body mass index is 16.29 kg/m.   Vital Signs:   Temp:  [97.6 F (36.4 C)-100.9 F (38.3 C)] 100 F (37.8 C) (03/19 0718) Pulse Rate:  [88-123] 89 (03/19 0800) Resp:  [21-25] 24 (03/19 0800) BP: (81-135)/(60-95) 81/60 (03/19 0800) SpO2:  [92 %-100 %] 97 % (03/19 0800) FiO2 (%):  [40 %-60 %] 40 % (03/19 0800) Weight:  [41.7 kg] 41.7 kg (03/19 0500) Last BM Date : 11/20/22  Weight change: Filed Weights   11/19/22 0500 11/20/22 0402 11/21/22 0500  Weight: 44.6 kg 43.8 kg 41.7 kg    Intake/Output:   Intake/Output Summary (Last 24 hours) at 11/21/2022 0806 Last data filed at 11/21/2022 0800 Gross per 24 hour  Intake 2453.17 ml  Output 1375 ml  Net 1078.17 ml     CVP 4-5  Physical Exam  General: Intubated  HEENT: ETT Neck: supple. no JVD. Carotids 2+ bilat; no bruits. No lymphadenopathy or thryomegaly appreciated. LIJ  Cor: PMI nondisplaced. Regular rate & rhythm. No rubs, gallops or murmurs. Lungs: clear Abdomen: soft, nontender, nondistended. No hepatosplenomegaly. No bruits or masses. Good bowel sounds. Extremities: no cyanosis, clubbing, rash, edema Neuro: Opens eyes follows commands.  .    Telemetry  A p sebnsed V paced  90-120s  No VT   Labs    CBC Recent Labs    11/20/22 0421 11/21/22 0112  WBC 13.1* 12.6*  NEUTROABS  --  10.1*  HGB 9.1* 9.7*  HCT 27.6* 29.2*  MCV 96.8 98.0  PLT 239 [id number]   Basic Metabolic Panel Recent Labs    11/20/22 0421 11/21/22 0112  NA 145 148*  K 3.2* 4.1  CL 111 116*  CO2 22 22  GLUCOSE 139* 120*  BUN 14 20  CREATININE 0.74 0.88  CALCIUM 8.5* 8.5*  MG 2.2 2.2  PHOS 2.1* 4.4   Liver  Function Tests Recent Labs    11/20/22 0421 11/21/22 0112  AST 21 29  ALT 34 35  ALKPHOS 53 60  BILITOT 0.3 0.6  PROT 6.0* 6.4*  ALBUMIN 2.1* 2.2*   No results for input(s): "LIPASE", "AMYLASE" in the last 72 hours. Cardiac Enzymes No results for input(s): "CKTOTAL", "CKMB", "CKMBINDEX", "TROPONINI" in the last 72 hours.  BNP: BNP (last 3 results) Recent Labs    11/11/22 1238 11/17/22 2150  BNP 58.5 145.2*    ProBNP (last 3 results) No results for input(s): "PROBNP" in the last 8760 hours.   D-Dimer No results for input(s): "DDIMER" in the last 72 hours. Hemoglobin A1C No results for input(s): "HGBA1C" in the last 72 hours.  Fasting Lipid Panel No results for input(s): "CHOL", "HDL", "LDLCALC", "TRIG", "CHOLHDL", "LDLDIRECT" in the last 72 hours. Thyroid Function Tests No results for input(s): "TSH", "T4TOTAL", "T3FREE", "THYROIDAB" in the last 72 hours.  Invalid input(s): "FREET3"  Other results:   Imaging    No results found.   Medications:     Scheduled Medications:  Chlorhexidine Gluconate Cloth  6 each Topical Daily   enoxaparin (LOVENOX) injection  30 mg Subcutaneous Q24H   feeding supplement (PROSource TF20)  60 mL Per Tube Daily   free water  200 mL Per Tube Q4H  insulin aspart  0-9 Units Subcutaneous Q4H   levothyroxine  112 mcg Per Tube QAC breakfast   mouth rinse  15 mL Mouth Rinse Q2H   pantoprazole (PROTONIX) IV  40 mg Intravenous QHS   polyethylene glycol  17 g Per Tube Daily   potassium chloride  40 mEq Per Tube BID   senna  1 tablet Per Tube QHS   sodium chloride flush  3 mL Intravenous Q12H   sucralfate  1 g Per Tube BID    Infusions:  sodium chloride     dexmedetomidine (PRECEDEX) IV infusion 0.6 mcg/kg/hr (11/21/22 0800)   feeding supplement (VITAL 1.5 CAL) 45 mL/hr at 11/21/22 0800   fentaNYL infusion INTRAVENOUS 50 mcg/hr (11/21/22 0800)    PRN Medications: sodium chloride, acetaminophen, docusate, fentaNYL, fentaNYL  (SUBLIMAZE) injection, guaiFENesin, ipratropium-albuterol, melatonin, midazolam, mouth rinse, mouth rinse, mouth rinse, polyethylene glycol, sodium chloride flush    Patient Profile   52 y.o. female with a hx of tobacco use, COPD, myotonic dystrophy type I since age 74, high grade heart block s/p PPM, RBBB, sickle cell trait, and DM1 (questionable diagnosis) and strong family history of heart failure (father s/p cardiac transplant and paternal aunt w/ NICM w/ LVAD), admitted w/ acute hypoxic respiratory failure/ LLL CAP secondary to metapneumovirus. Hospital course c/b VF arrest. Post arrest echo w/ severe BiV failure, LVEF < 20%, RV severely reduced + diffuse HK   Assessment/Plan    1. VF arrest: 3/12, shocked. 15 minutes CPR total.  - Due to long-short interval (R-on-T) in the setting of QT-prolonging drugs (azithro) and hypokalemia - Pacemaker lower rate limit has been increased to prevent pause-dependent TdP; this should be reprogrammed to a more physiologic rate if/when approaching discharge. - Per EP: may require upgrade with LV lead +/-upgrade to primary prevention ICD if EF does not recover with GDMT. VT this admission was attributable to a reversible cause, so ICD not indicated urgently for secondary prevention. Can consider placement of LifeVest on discharge - Keep K> 4.0 Mg > 2.0 - avoid QT prolonging agents  2. Acute hypoxemic respiratory failure: Intubated post-VF arrest.  Had viral PNA with metapneumovirus at presentation.  - extubated 3/14 Reintubated 3/16 ? Possible trach  - CCM managing.   3. Acute systolic CHF: Prior workup for cardiac sarcoidosis. Cardiac MRI in 2017 showed subendocardial LGE in the mid inferior and inferolateral walls with EF 56%.  Cardiac PET done after this was NOT suggestive of cardiac sarcoidosis.  - Echo in 5/22 with normal EF.   - Echo this admission reviewed, EF 20-25% range with diffuse hypokinesis but relatively preserved function at the apex,  moderate RV dysfunction.  - cath 3/15 no CAD - suspect due to myotonic dystrophy type 1 -> she has an aunt with LVAD who has cardiomyopathy due to myotonic dystrophy.  Father had a heart transplant, uncertain of underlying condition. R/LHC c/w Nonischemic cardiomyopathy.  - CVP 4-5 . Does not need diuretics.  - Off pressors. No room for GDMT.  - Given overall debility, she is not currently a candidate for advanced therapies - Would consider palliative care input    4. High grade heart block: Has MDT PPM with His bundle lead.  This is likely due to cardiac involvement of myotonic dystrophy type 1.  - plan as above  5. Myotonic dystrophy: Type 1, diagnosed at age 37. Has family members with the same diagnosis.  Followed by neurology.   6. Tobacco abuse - needs cessation  7. Hypokalemia/hypomag -  supp K     Length of Stay: 7  Amy Clegg, NP  11/21/2022, 8:06 AM  Advanced Heart Failure Team Pager [phone] (M-F; [place])  Please contact [facility] for night-coverage after hours (5p -7a ) and weekends on [email or web address]  Agree with above.   Remains intubated. Now on PS trial. Tolerating well though TVs seem low. Co-ox stable off NE. Volume status ok with CVP 7  A pacing turned down by EP this afternoon. No recurrent VT/VF  General:  Cachetic. Weak appearing. On vent HEENT: normal + ETT Neck: supple. no JVD. Carotids 2+ bilat; no bruits. No lymphadenopathy or thryomegaly appreciated. Cor: PMI nondisplaced. Regular rate & rhythm. No rubs, gallops or murmurs. Lungs: clear Abdomen: soft, nontender, nondistended. No hepatosplenomegaly. No bruits or masses. Good bowel sounds. Extremities: no cyanosis, clubbing, rash, edema Neuro: awake on vent follows commands  Remains very tenuous. Will likely be ready for repeat attempt at extubation tomorrow. IF able to be stabilized will need to consider LifeVest if/when stable. Titrate GDMT as BP allows.   CRITICAL CARE Performed by: Glori Bickers  Total critical care time: 35 minutes  Critical care time was exclusive of separately billable procedures and treating other patients.  Critical care was necessary to treat or prevent imminent or life-threatening deterioration.  Critical care was time spent personally by me (independent of midlevel providers or residents) on the following activities: development of treatment plan with patient and/or surrogate as well as nursing, discussions with consultants, evaluation of patient's response to treatment, examination of patient, obtaining history from patient or surrogate, ordering and performing treatments and interventions, ordering and review of laboratory studies, ordering and review of radiographic studies, pulse oximetry and re-evaluation of patient's condition.  Glori Bickers, MD  10:30 PM

## 2022-11-21 NOTE — Progress Notes (Signed)
NAME:  Cynthia Underwood, MRN:  [id number], DOB:  July 28, 1971, LOS: 7 ADMISSION DATE:  11/11/2022 CONSULTATION DATE:  11/14/2022 REFERRING MD:  Wyline Copas - TRH CHIEF COMPLAINT:  Vfib arrest on floor   History of Present Illness:   52 year old woman who initially presented to [facility] 3/9 for cough and progressive dyspnea x 4 days. Also reported diarrhea and 1 episode of epigastric pain with vomiting. PMHx significant for RBBB/high-grade heart block (s/p PPM), hypothyroidism, sickle cell trait, myotonic dystrophy (dx age 35), tobacco use.  Noted to be hypoxic in ED with O2 sat 88% on RA, CTA Chest c/f LLL PNA with mild bronchiectasis, negative for PE. Also c/f esophagitis. Patient was empirically started on CAP coverage (ceftriaxone, azithromycin) and GERD treatment (PPI/sucralfate). Also found to be positive for human metapneumovirus. Admitted to [facility].  Patient was initially progressing as expected 3/11-3/12 until ~1400, sat was noted to be in 80s. Placed on 5L humidified oxygen with slight improvement in sats. Solumedrol and DuoNebs were ordered. Around 1900 patient's RN was informed by telemetry of VF and was found unresponsive and pulseless. Code Blue was called with CPR x 15 minutes and ROSC. Amiodarone, Epi and defib were administered. Patient was intubated by anesthesia peri-code. IO was placed and Levophed was initiated. Cardiology was consulted.  PCCM was consulted for ICU admission post-arrest.  Pertinent Medical History:   Past Medical History:  Diagnosis Date   Bifascicular block 10/02/2014   Cataract    Dyspnea    Excess ear wax    Multinodular goiter    Pneumonia    Presence of permanent cardiac pacemaker    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   Sickle cell trait (HCC)    Watery eyes    Left   Weakness of both legs    Significant Hospital Events: Including procedures, antibiotic start and stop dates in addition to other pertinent events    3/9 - Presented to [facility] for LLL PNA. Started on Surveyor, quantity. +human metapneumovirus. 3/12 - Hypoxic requiring 5LNC. Later VF arrest on floor, CPR 3/13 echocardiogram LVEF severely depressed 50% (decreased compared with 01/2021), severely reduced RV function 3/14 Cardiac catheterization showed normal coronaries, new normal filling pressures and normal PA pressure.  With excellent cardiac output 3/15 Extubated.  3/16 Reintubated for increased work of breathing, failed BiPAP 3/18 alert, interactive off vasopressors  Interim History / Subjective:   HR up, had 700 cc in bladder, foley replaced, very weak not tolerating long on PS  Objective:  Blood pressure (!) 81/60, pulse 89, temperature 100 F (37.8 C), temperature source Axillary, resp. rate (!) 24, height 5\' 3"  (1.6 m), weight 41.7 kg, last menstrual period 12/03/2016, SpO2 97 %. CVP:  [4 mmHg-9 mmHg] 9 mmHg  Vent Mode: PRVC FiO2 (%):  [40 %-60 %] 40 % Set Rate:  [24 bmp] 24 bmp Vt Set:  [410 mL] 410 mL PEEP:  [5 cmH20] 5 cmH20 Plateau Pressure:  [19 cmH20-22 cmH20] 22 cmH20   Intake/Output Summary (Last 24 hours) at 11/21/2022 0856 Last data filed at 11/21/2022 0800 Gross per 24 hour  Intake 2453.17 ml  Output 2100 ml  Net 353.17 ml    Filed Weights   11/19/22 0500 11/20/22 0402 11/21/22 0500  Weight: 44.6 kg 43.8 kg 41.7 kg   Physical Examination: BP (!) 81/60 (BP Location: Right Arm)   Pulse 89   Temp 100 F (37.8 C) (Axillary)   Resp (!) 24   Ht 5\' 3"  (1.6  m)   Wt 41.7 kg   LMP 12/03/2016 (Approximate)   SpO2 97%   BMI 16.29 kg/m  Gen:      No acute distress, frail, malnourished HEENT:  EOMI, sclera anicteric Neck:     No masses; no thyromegaly, ET tube Lungs:    Coarse ventilated sounds CV:         Regular rate and rhythm; no murmurs Abd:      + bowel sounds; soft, non-tender; no palpable masses, no distension Ext:    No edema; adequate peripheral perfusion Skin:      Warm and dry; no rash Neuro:  Sedated   Resolved Hospital Problem List:    Assessment & Plan:  Undifferentiated shock, ?septic vs. cardiogenic post-arrest.  Suspect in part due to need for sedation. --Now off vasopressors -- minimize sedatives  Post-Vfib arrest Troponinemia History of RBBB and PPM implantation New cardiomyopathy, LVEF 20% --Cardiac cath shows nonischemic cardiomyopathy with clean coronaries, normal filling pressures and no evidence of pulmonary hypertension --Cardiology and EP are following  Acute hypoxemic respiratory failure LLL PNA +Human metapneumovirus infection --Reintubated 3/16 after failing BiPAP, likely due to neuromuscular weakness in the setting of myotonic dystrophy worsened in the setting of myopathy of critical illness -- Consider another trial of extubation versus tracheostomy if within goals of care, too weak not passing SBT, not tolerating long on PSV  Acute metabolic encephalopathy --Multifactorial in the setting of post-arrest, metabolic derangements/acidosis, hypoxia, sedation. --Now improved --wean sedatives  Shock liver, Resolved -- likely in the setting of VF arrest with transaminitis post-code.  Hyperglycemia --Sliding scale insulin   GERD with esophagitis -- Continue PPI  Hypothyroidism -- Continue levothyroxine  Constipation -- Bowel regimen  Best Practice: (right click and "Reselect all SmartList Selections" daily)   Diet/type: NPO w/ meds via tube DVT prophylaxis: Lovenox GI prophylaxis: PPI Lines: Central line and Arterial Line Foley:  N/A Code Status:  full code Last date of multidisciplinary goals of care discussion [Family updated at bedside, 3/18]  Critical care time:    CRITICAL CARE Performed by: Lanier Clam   Total critical care time: 31 minutes  Critical care time was exclusive of separately billable procedures and treating other patients.  Critical care was necessary to treat or prevent imminent or life-threatening  deterioration.  Critical care was time spent personally by me on the following activities: development of treatment plan with patient and/or surrogate as well as nursing, discussions with consultants, evaluation of patient's response to treatment, examination of patient, obtaining history from patient or surrogate, ordering and performing treatments and interventions, ordering and review of laboratory studies, ordering and review of radiographic studies, pulse oximetry and re-evaluation of patient's condition.   Lanier Clam, MD [place] See Amion for pager  If no response to pager , please call [phone] until 7pm After 7:00 pm call Elink  [phone] 11/21/2022, 8:56 AM

## 2022-11-21 NOTE — Progress Notes (Addendum)
eLink Physician-Brief Progress Note Patient Name: [name] DOB: June 04, 1971 MRN: [id number]   Date of Service  11/21/2022  HPI/Events of Note  52-year-old woman who initially presented to [facility] 3/9 for cough and progressive dyspnea x 4 days. Also reported diarrhea and 1 episode of epigastric pain with vomiting. PMHx significant for RBBB/high-grade heart block (s/p PPM), hypothyroidism, sickle cell trait, myotonic dystrophy (dx age 4), tobacco use.   Extubated and reintubated, stable BP but now tachy to 130. Still paced but inconsistent atrial pacing  eICU Interventions  Given hemodynamic stability, no change in plan for now.  Add pacer check in AM.   EKG pending, will review   0206 - ecg reviewed. Tachycardic but otherwise as expected.  0529 - bladder scan is showing over 700cc in bladder.  Nurses have attempted to straight cath, but unable to due to the size of the catheter. They do have a 59fr foley catheter kit; place foley for retention  Intervention Category Intermediate Interventions: Arrhythmia - evaluation and management  Baby Gieger 11/21/2022, 1:35 AM

## 2022-11-21 NOTE — Progress Notes (Signed)
With QT improvement LRL adjusted down to 70.  In attempt to promote intrinsic conduction, AV delays adjusted to 280 ms when A paced and 250 ms when A sensed.     This resulted in AS-VS (patients own intrinsic rhythm) at approx 80 bpm.   Paced AV delay of 260 resulted in a V-paced beat every 3-4 beats.  If pacing cardiomyopathy felt to be contributing, this may help by limiting RV pacing.  EKG ordered.  Mich ael "[address] [place], [place]

## 2022-11-21 NOTE — Progress Notes (Signed)
Patient Name: Cynthia Underwood Date of Encounter: 11/21/2022  Primary Cardiologist: Fransico Him, MD Electrophysiologist: Virl Axe, MD  Interval Summary   Remains intubated. Intermittently tachycardia during the night, but no further VT. Tachycardia appears atrially driven with atrial rate around 120-125 with V pacing.    Inpatient Medications    Scheduled Meds:  Chlorhexidine Gluconate Cloth  6 each Topical Daily   enoxaparin (LOVENOX) injection  30 mg Subcutaneous Q24H   feeding supplement (PROSource TF20)  60 mL Per Tube Daily   insulin aspart  0-9 Units Subcutaneous Q4H   levothyroxine  112 mcg Per Tube QAC breakfast   mouth rinse  15 mL Mouth Rinse Q2H   pantoprazole (PROTONIX) IV  40 mg Intravenous QHS   polyethylene glycol  17 g Per Tube Daily   potassium chloride  40 mEq Per Tube BID   senna  1 tablet Per Tube QHS   sodium chloride flush  3 mL Intravenous Q12H   sucralfate  1 g Per Tube BID   Continuous Infusions:  sodium chloride     dexmedetomidine (PRECEDEX) IV infusion 1 mcg/kg/hr (11/21/22 0653)   feeding supplement (VITAL 1.5 CAL) 45 mL/hr at 11/21/22 0653   fentaNYL infusion INTRAVENOUS 75 mcg/hr (11/21/22 0653)   PRN Meds: sodium chloride, acetaminophen, docusate, fentaNYL, fentaNYL (SUBLIMAZE) injection, guaiFENesin, ipratropium-albuterol, melatonin, midazolam, mouth rinse, mouth rinse, mouth rinse, polyethylene glycol, sodium chloride flush   Vital Signs    Vitals:   11/21/22 0336 11/21/22 0339 11/21/22 0500 11/21/22 0718  BP:      Pulse:      Resp:      Temp: 99.5 F (37.5 C)   100 F (37.8 C)  TempSrc: Oral   Axillary  SpO2:  96%    Weight:   41.7 kg   Height:        Intake/Output Summary (Last 24 hours) at 11/21/2022 0722 Last data filed at 11/21/2022 0653 Gross per 24 hour  Intake 2447.72 ml  Output 1375 ml  Net 1072.72 ml   Filed Weights   11/19/22 0500 11/20/22 0402 11/21/22 0500  Weight: 44.6 kg 43.8 kg 41.7 kg    Physical  Exam    GEN- The patient is well appearing, alert and oriented x 3 today.   Lungs- Clear to ausculation bilaterally, normal work of breathing Cardiac- Regular rate and rhythm, no murmurs, rubs or gallops GI- soft, NT, ND, + BS Extremities- no clubbing or cyanosis. No edema  Telemetry    AV dual paced at 90, with intermittent AS-VP tachycardia around 120 bpm (personally reviewed)  Cynthia Underwood is a 52 y.o. female with a history of Myotonic dystrophy (genetically confirmed), bifascicular block, advanced AV block s/p MDT DDD PPM, anemia, Goiter s/p thyroidectomy, Sickle cell trait, who is being seen today for the evaluation of VT/VF arrest at the request of Dr. Harrington Challenger.    Felt to have torsades in the setting of hypokalemia and QT prolongation on azithromycin.   Assessment & Plan    Polymorphic VT Due to long-short interval (R-on-T) in the setting of QT prolonging drugs and hypokalemia PPM LRL increased to 90 bpm. Will re-program to a more physiologic rate prior to discharge.  No further.  QT this am remains around 470-480 when corrected for QRS, does appear improved from day of arrest. Tachycardia overnight is not concerning for VT. Likely ST vs AT with atrial rate ~ 120 bpm   Cardiomyopathy TTE this admission EF 20-25%,  compared to TTE 01/2021 with normal EF ? If pacing induced cardiomyopathy could have contributed GDMT being introduced by CHF team Unlikely to tolerate BB yet.    Myotonic dystrophy   MDT PPM May need to consider upgrade with LV lead +/- ICD for primary prevention if EF does not recover. In setting of reversible cause of her PMVT, not urgent indication for secondary prevention   Hypoxic respiratory failure Re-intubated, awake.  PEEP 5 and O2 conc 40% this am.   For questions or updates, please contact [place] Please consult [email or web address] for contact info under Cardiology/STEMI.  Signed, Shirley Friar, PA-C  11/21/2022,  7:22 AM

## 2022-11-22 ENCOUNTER — Inpatient Hospital Stay (HOSPITAL_COMMUNITY): Payer: 59

## 2022-11-22 DIAGNOSIS — I469 Cardiac arrest, cause unspecified: Secondary | ICD-10-CM | POA: Diagnosis not present

## 2022-11-22 DIAGNOSIS — J189 Pneumonia, unspecified organism: Secondary | ICD-10-CM | POA: Diagnosis not present

## 2022-11-22 DIAGNOSIS — I5021 Acute systolic (congestive) heart failure: Secondary | ICD-10-CM | POA: Diagnosis not present

## 2022-11-22 LAB — CBC WITH DIFFERENTIAL/PLATELET
Abs Immature Granulocytes: 0.09 10*3/uL — ABNORMAL HIGH (ref 0.00–0.07)
Basophils Absolute: 0 10*3/uL (ref 0.0–0.1)
Basophils Relative: 0 %
Eosinophils Absolute: 0.3 10*3/uL (ref 0.0–0.5)
Eosinophils Relative: 2 %
HCT: 26.5 % — ABNORMAL LOW (ref 36.0–46.0)
Hemoglobin: 8.6 g/dL — ABNORMAL LOW (ref 12.0–15.0)
Immature Granulocytes: 1 %
Lymphocytes Relative: 16 %
Lymphs Abs: 1.9 10*3/uL (ref 0.7–4.0)
MCH: 32.2 pg (ref 26.0–34.0)
MCHC: 32.5 g/dL (ref 30.0–36.0)
MCV: 99.3 fL (ref 80.0–100.0)
Monocytes Absolute: 0.7 10*3/uL (ref 0.1–1.0)
Monocytes Relative: 6 %
Neutro Abs: 8.7 10*3/uL — ABNORMAL HIGH (ref 1.7–7.7)
Neutrophils Relative %: 75 %
Platelets: 259 10*3/uL (ref 150–400)
RBC: 2.67 MIL/uL — ABNORMAL LOW (ref 3.87–5.11)
RDW: 17.1 % — ABNORMAL HIGH (ref 11.5–15.5)
WBC: 11.7 10*3/uL — ABNORMAL HIGH (ref 4.0–10.5)
nRBC: 0.4 % — ABNORMAL HIGH (ref 0.0–0.2)

## 2022-11-22 LAB — GLUCOSE, CAPILLARY
Glucose-Capillary: 123 mg/dL — ABNORMAL HIGH (ref 70–99)
Glucose-Capillary: 125 mg/dL — ABNORMAL HIGH (ref 70–99)
Glucose-Capillary: 125 mg/dL — ABNORMAL HIGH (ref 70–99)
Glucose-Capillary: 152 mg/dL — ABNORMAL HIGH (ref 70–99)
Glucose-Capillary: 128 mg/dL — ABNORMAL HIGH (ref 70–99)
Glucose-Capillary: 122 mg/dL — ABNORMAL HIGH (ref 70–99)

## 2022-11-22 LAB — BASIC METABOLIC PANEL
Anion gap: 12 (ref 5–15)
BUN: 12 mg/dL (ref 6–20)
CO2: 24 mmol/L (ref 22–32)
Calcium: 8.5 mg/dL — ABNORMAL LOW (ref 8.9–10.3)
Chloride: 112 mmol/L — ABNORMAL HIGH (ref 98–111)
Creatinine, Ser: 0.72 mg/dL (ref 0.44–1.00)
GFR, Estimated: >60 mL/min (ref >60)
Glucose, Bld: 126 mg/dL — ABNORMAL HIGH (ref 70–99)
Potassium: 3.3 mmol/L — ABNORMAL LOW (ref 3.5–5.1)
Sodium: 148 mmol/L — ABNORMAL HIGH (ref 135–145)

## 2022-11-22 LAB — COOXEMETRY PANEL
Carboxyhemoglobin: 1.6 % — ABNORMAL HIGH (ref 0.5–1.5)
Methemoglobin: <0.7 % (ref 0.0–1.5)
O2 Saturation: 66.5 %
Total hemoglobin: 8.9 g/dL — ABNORMAL LOW (ref 12.0–16.0)

## 2022-11-22 LAB — MAGNESIUM: Magnesium: 2 mg/dL (ref 1.7–2.4)

## 2022-11-22 MED ORDER — POTASSIUM CHLORIDE 10 MEQ/50ML IV SOLN
10.0000 meq | INTRAVENOUS | Status: AC
  Administered 2022-11-22 (×4): 10 meq via INTRAVENOUS
  Filled 2022-11-22 (×4): qty 50

## 2022-11-22 MED ORDER — POTASSIUM CHLORIDE 20 MEQ PO PACK
20.0000 meq | PACK | ORAL | Status: DC
  Administered 2022-11-22: 20 meq
  Filled 2022-11-22: qty 1

## 2022-11-22 MED ORDER — ALBUMIN HUMAN 25 % IV SOLN
25.0000 g | Freq: Once | INTRAVENOUS | Status: AC
  Administered 2022-11-22: 25 g via INTRAVENOUS
  Filled 2022-11-22: qty 100

## 2022-11-22 NOTE — Progress Notes (Cosign Needed)
NAME:  Cynthia Underwood, MRN:  [id number], DOB:  28-Nov-1970, LOS: 8 ADMISSION DATE:  11/11/2022 CONSULTATION DATE:  11/14/2022 REFERRING MD:  Wyline Copas - TRH CHIEF COMPLAINT:  Vfib arrest on floor   History of Present Illness:   52 year old woman who initially presented to [facility] 3/9 for cough and progressive dyspnea x 4 days. Also reported diarrhea and 1 episode of epigastric pain with vomiting. PMHx significant for RBBB/high-grade heart block (s/p PPM), hypothyroidism, sickle cell trait, myotonic dystrophy (dx age 61), tobacco use.  Noted to be hypoxic in ED with O2 sat 88% on RA, CTA Chest c/f LLL PNA with mild bronchiectasis, negative for PE. Also c/f esophagitis. Patient was empirically started on CAP coverage (ceftriaxone, azithromycin) and GERD treatment (PPI/sucralfate). Also found to be positive for human metapneumovirus. Admitted to [facility]  Patient was initially progressing as expected 3/11-3/12 until ~1400, sat was noted to be in 80s. Placed on 5L humidified oxygen with slight improvement in sats. Solumedrol and DuoNebs were ordered. Around 1900 patient's RN was informed by telemetry of VF and was found unresponsive and pulseless. Code Blue was called with CPR x 15 minutes and ROSC. Amiodarone, Epi and defib were administered. Patient was intubated by anesthesia peri-code. IO was placed and Levophed was initiated. Cardiology was consulted.  PCCM was consulted for ICU admission post-arrest.  Pertinent Medical History:   Past Medical History:  Diagnosis Date   Bifascicular block 10/02/2014   Cataract    Dyspnea    Excess ear wax    Multinodular goiter    Pneumonia    Presence of permanent cardiac pacemaker    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   Sickle cell trait (HCC)    Watery eyes    Left   Weakness of both legs    Significant Hospital Events: Including procedures, antibiotic start and stop dates in addition to other pertinent events    3/9 - Presented to [facility] for LLL PNA. Started on Surveyor, quantity. +human metapneumovirus. 3/12 - Hypoxic requiring 5LNC. Later VF arrest on floor, CPR 3/13 echocardiogram LVEF severely depressed 50% (decreased compared with 01/2021), severely reduced RV function 3/14 Cardiac catheterization showed normal coronaries, new normal filling pressures and normal PA pressure.  With excellent cardiac output 3/15 Extubated.  3/16 Reintubated for increased work of breathing, failed BiPAP 3/18 alert, interactive off vasopressors 3/20 Wean, trial extubation today   Interim History / Subjective:   HR up, had 700 cc in bladder, foley replaced, very weak not tolerating long on PS  Objective:  Blood pressure 111/84, pulse (!) 108, temperature (!) 101.8 F (38.8 C), temperature source Oral, resp. rate (!) 24, height 5\' 3"  (1.6 m), weight 40.3 kg, last menstrual period 12/03/2016, SpO2 98 %. CVP:  [6 mmHg-12 mmHg] 12 mmHg  Vent Mode: PRVC FiO2 (%):  [40 %] 40 % Set Rate:  [24 bmp] 24 bmp Vt Set:  [410 mL] 410 mL PEEP:  [5 cmH20] 5 cmH20 Pressure Support:  [5 cmH20-10 cmH20] 10 cmH20 Plateau Pressure:  [21 cmH20-22 cmH20] 22 cmH20   Intake/Output Summary (Last 24 hours) at 11/22/2022 0741 Last data filed at 11/22/2022 0541 Gross per 24 hour  Intake 1255.32 ml  Output 2265 ml  Net -1009.68 ml    Filed Weights   11/20/22 0402 11/21/22 0500 11/22/22 0353  Weight: 43.8 kg 41.7 kg 40.3 kg   Physical Examination:  General: ill appearing adult, lying in ICU bed  HENT: Normocephalic, ETT in place/OG in  place  Cardio: s1, s2, auscultated, RRR, no m/r/g Pulm: CTA BL, no respiratory distress Abd: BS active, OG in place GU: Foley in place Skin: warm, dry  Extremities: no edema Neuro: RASS -2, opens eyes to stimuli   Resolved Hospital Problem List:    Assessment & Plan:  Undifferentiated shock, ?septic vs. cardiogenic post-arrest.  Suspect in part due to need for sedation. Still remains off  pressors, some hypotension overnight Having Fevers P: Continue to monitor VS Continue to wean off sedatives  Obtain tracheal aspirate Obtain Vascular Dopplers of LE   Post-Vfib arrest Troponinemia History of RBBB and PPM implantation New cardiomyopathy, LVEF 20% Cardiac cath shows nonischemic cardiomyopathy with clean coronaries, normal filling pressures and no evidence of pulmonary hypertension. Cardiomyopathy more than likely related to myotonic dystrophy type 1 per cardiology  P: Cardiology and EP continues to follow  Continue to trend vitals  Continue to have ongoing [place] with family   Acute hypoxemic respiratory failure LLL PNA +Human metapneumovirus infection Reintubated 3/16 after failing BiPAP, likely due to neuromuscular weakness in the setting of myotonic dystrophy worsened in the setting of myopathy of critical illness P:  Attempt to SBT/wean sedatives-titrate off fentanyl, continue precedex  May trial extubation, if not successful-reintubate and schedule trache   Acute metabolic encephalopathy Multifactorial in the setting of post-arrest, metabolic derangements/acidosis, hypoxia, sedation. Continue to improve  P: Continue to decrease sedation Continue delirium precautions-light during day, frequent addressing reorientation, etc..    Hypernatremia 3/20 154 to 148  Improving  P:  Continue Free Water 200 ml q4hrs  Continue to trend BMETS   Hyperglycemia CBGs consistently in the 120s  P: Continue Sliding scale insulin   GERD with esophagitis P: Continue utilization of protonix IV  Hypothyroidism P: Continue to utilize levothyroxine  Constipation Patient having liquid stools P: Hold off on [employer]: (right click and "Reselect all SmartList Selections" daily)   Diet/type: NPO w/ meds via tube DVT prophylaxis: Lovenox GI prophylaxis: PPI Lines: Central line and Arterial Line Foley:  N/A Code Status:  full code Last date of  multidisciplinary goals of care discussion [Will update family at beside]  Critical care time:    CRITICAL CARE Performed by: Waldo Laine BSN, RN, S-ACNP    Total critical care time: 31 minutes  Critical care time was exclusive of separately billable procedures and treating other patients.  Critical care was necessary to treat or prevent imminent or life-threatening deterioration.  Critical care was time spent personally by me on the following activities: development of treatment plan with patient and/or surrogate as well as nursing, discussions with consultants, evaluation of patient's response to treatment, examination of patient, obtaining history from patient or surrogate, ordering and performing treatments and interventions, ordering and review of laboratory studies, ordering and review of radiographic studies, pulse oximetry and re-evaluation of patient's condition.   Waldo Laine, RN, BSN, S-ACNP  [place] [facility] See Amion for pager  If no response to pager , please call [phone] until 7pm After 7:00 pm call Elink  [phone] 11/22/2022, 7:41 AM

## 2022-11-22 NOTE — Progress Notes (Addendum)
Advanced Heart Failure Rounding Note  PCP-Cardiologist: Fransico Him, MD   Subjective:    Cath 3/14 c/w nonischemic cardiomyopathy, filling pressures minimally elevated on RHC. Excellent cardiac output by Fick. Extubated 3/15. Reintubated (3/16)  CO-OX 67%.   Remains intubated. Awake on vent.   Objective:   Weight Range: 40.3 kg Body mass index is 15.74 kg/m.   Vital Signs:   Temp:  [98.6 F (37 C)-101.8 F (38.8 C)] 99.8 F (37.7 C) (03/20 0745) Pulse Rate:  [67-119] 70 (03/20 0846) Resp:  [19-30] 20 (03/20 0846) BP: (80-126)/(52-84) 91/65 (03/20 0812) SpO2:  [91 %-99 %] 97 % (03/20 0812) FiO2 (%):  [40 %] 40 % (03/20 0812) Weight:  [40.3 kg] 40.3 kg (03/20 0353) Last BM Date : 11/21/22  Weight change: Filed Weights   11/20/22 0402 11/21/22 0500 11/22/22 0353  Weight: 43.8 kg 41.7 kg 40.3 kg    Intake/Output:   Intake/Output Summary (Last 24 hours) at 11/22/2022 0859 Last data filed at 11/22/2022 0800 Gross per 24 hour  Intake 1728.23 ml  Output 1815 ml  Net -86.77 ml     CVP 4-5  Physical Exam  General: Intubated HEENT: ETT Neck: supple. no JVD. Carotids 2+ bilat; no bruits. No lymphadenopathy or thryomegaly appreciated. Cor: PMI nondisplaced. Regular rate & rhythm. No rubs, gallops or murmurs. Lungs: clear Abdomen: soft, nontender, nondistended. No hepatosplenomegaly. No bruits or masses. Good bowel sounds. Extremities: no cyanosis, clubbing, rash, edema Neuro: Intubated.   Telemetry    A Paced  80s  No VT Labs    CBC Recent Labs    11/21/22 0112 11/22/22 0323  WBC 12.6* 11.7*  NEUTROABS 10.1* 8.7*  HGB 9.7* 8.6*  HCT 29.2* 26.5*  MCV 98.0 99.3  PLT 267 [id number]   Basic Metabolic Panel Recent Labs    11/20/22 0421 11/21/22 0112 11/21/22 1313 11/22/22 0323  NA 145 148* 154* 148*  K 3.2* 4.1 3.9 3.3*  CL 111 116* 122* 112*  CO2 22 22 23 24   GLUCOSE 139* 120* 100* 126*  BUN 14 20 21* 12  CREATININE 0.74 0.88 0.79 0.72  CALCIUM  8.5* 8.5* 8.3* 8.5*  MG 2.2 2.2  --  2.0  PHOS 2.1* 4.4  --   --    Liver Function Tests Recent Labs    11/20/22 0421 11/21/22 0112  AST 21 29  ALT 34 35  ALKPHOS 53 60  BILITOT 0.3 0.6  PROT 6.0* 6.4*  ALBUMIN 2.1* 2.2*   No results for input(s): "LIPASE", "AMYLASE" in the last 72 hours. Cardiac Enzymes No results for input(s): "CKTOTAL", "CKMB", "CKMBINDEX", "TROPONINI" in the last 72 hours.  BNP: BNP (last 3 results) Recent Labs    11/11/22 1238 11/17/22 2150  BNP 58.5 145.2*    ProBNP (last 3 results) No results for input(s): "PROBNP" in the last 8760 hours.   D-Dimer No results for input(s): "DDIMER" in the last 72 hours. Hemoglobin A1C No results for input(s): "HGBA1C" in the last 72 hours.  Fasting Lipid Panel No results for input(s): "CHOL", "HDL", "LDLCALC", "TRIG", "CHOLHDL", "LDLDIRECT" in the last 72 hours. Thyroid Function Tests No results for input(s): "TSH", "T4TOTAL", "T3FREE", "THYROIDAB" in the last 72 hours.  Invalid input(s): "FREET3"  Other results:   Imaging    No results found.   Medications:     Scheduled Medications:  Chlorhexidine Gluconate Cloth  6 each Topical Daily   enoxaparin (LOVENOX) injection  30 mg Subcutaneous Q24H   feeding supplement (PROSource  TF20)  60 mL Per Tube Daily   free water  200 mL Per Tube Q4H   insulin aspart  0-9 Units Subcutaneous Q4H   levothyroxine  112 mcg Per Tube QAC breakfast   magnesium oxide  400 mg Per Tube Daily   mouth rinse  15 mL Mouth Rinse Q2H   pantoprazole (PROTONIX) IV  40 mg Intravenous QHS   potassium chloride  20 mEq Per Tube Q4H   potassium chloride  40 mEq Per Tube Daily   senna  1 tablet Per Tube QHS   sodium chloride flush  3 mL Intravenous Q12H   sucralfate  1 g Per Tube BID    Infusions:  sodium chloride     dexmedetomidine (PRECEDEX) IV infusion 0.8 mcg/kg/hr (11/22/22 0800)   feeding supplement (VITAL 1.5 CAL) 45 mL/hr at 11/22/22 0800   fentaNYL infusion  INTRAVENOUS Stopped (11/22/22 0746)   potassium chloride 50 mL/hr at 11/22/22 0800    PRN Medications: sodium chloride, acetaminophen, docusate, fentaNYL, guaiFENesin, ipratropium-albuterol, melatonin, midazolam, mouth rinse, mouth rinse, mouth rinse, polyethylene glycol, sodium chloride flush    Patient Profile   52 y.o. female with a hx of tobacco use, COPD, myotonic dystrophy type I since age 53, high grade heart block s/p PPM, RBBB, sickle cell trait, and DM1 (questionable diagnosis) and strong family history of heart failure (father s/p cardiac transplant and paternal aunt w/ NICM w/ LVAD), admitted w/ acute hypoxic respiratory failure/ LLL CAP secondary to metapneumovirus. Hospital course c/b VF arrest. Post arrest echo w/ severe BiV failure, LVEF < 20%, RV severely reduced + diffuse HK   Assessment/Plan    1. VF arrest: 3/12, shocked. 15 minutes CPR total.  - Due to long-short interval (R-on-T) in the setting of QT-prolonging drugs (azithro) and hypokalemia - Pacemaker lower rate limit has been increased to prevent pause-dependent TdP; this should be reprogrammed to a more physiologic rate if/when approaching discharge. - Per EP: may require upgrade with LV lead +/-upgrade to primary prevention ICD if EF does not recover with GDMT. VT this admission was attributable to a reversible cause, so ICD not indicated urgently for secondary prevention. Can consider placement of LifeVest on discharge.  - No arrhythmias.  - Keep K> 4.0 Mg > 2.0 - avoid QT prolonging agents  2. Acute hypoxemic respiratory failure: Intubated post-VF arrest.  Had viral PNA with metapneumovirus at presentation.  - extubated 3/14 Reintubated 3/16 ? Possible trach  - CCM managing.   3. Acute systolic CHF: Prior workup for cardiac sarcoidosis. Cardiac MRI in 2017 showed subendocardial LGE in the mid inferior and inferolateral walls with EF 56%.  Cardiac PET done after this was NOT suggestive of cardiac sarcoidosis.   - Echo in 5/22 with normal EF.   - Echo this admission reviewed, EF 20-25% range with diffuse hypokinesis but relatively preserved function at the apex, moderate RV dysfunction.  - cath 3/15 no CAD - suspect due to myotonic dystrophy type 1 -> she has an aunt with LVAD who has cardiomyopathy due to myotonic dystrophy.  Father had a heart transplant, uncertain of underlying condition. R/LHC c/w Nonischemic cardiomyopathy.  - CVP 4-5 . Does not need diuretics.  - Off pressors. No room for GDMT.  - Given overall debility, she is not currently a candidate for advanced therapies - Would consider palliative care input    4. High grade heart block: Has MDT PPM with His bundle lead.  This is likely due to cardiac involvement of myotonic dystrophy  type 1.  - plan as above  5. Myotonic dystrophy: Type 1, diagnosed at age 44. Has family members with the same diagnosis.  Followed by neurology.   6. Tobacco abuse - needs cessation  7. Hypokalemia/hypomag - supp K  - Mag ok.    Length of Stay: Benson, NP  11/22/2022, 8:59 AM  Advanced Heart Failure Team Pager [phone] (M-F; 7a - 5p)  Please contact [facility] for night-coverage after hours (5p -7a ) and weekends on [email or web address]   Patient seen and examined with the above-signed Advanced Practice Provider and/or Housestaff. I personally reviewed laboratory data, imaging studies and relevant notes. I independently examined the patient and formulated the important aspects of the plan. I have edited the note to reflect any of my changes or salient points. I have personally discussed the plan with the patient and/or family.  Awake on vent. Follows commands. Unable to wean vent due to muscle weakness.  General:  Weak appearing. On vent HEENT: normal + ETT Neck: supple. no JVD. Carotids 2+ bilat; no bruits. No lymphadenopathy or thryomegaly appreciated. Cor: PMI nondisplaced. Regular rate & rhythm. No rubs, gallops or murmurs. Lungs:  clear Abdomen: soft, nontender, nondistended. No hepatosplenomegaly. No bruits or masses. Good bowel sounds. Extremities: no cyanosis, clubbing, rash, edema Neuro: awake follows commands  Unable to wean from vent due to muscle weakness. Otherwise stable from HF standpoint. Rhythm stable. Not candidate for advanced HF therapies.   CRITICAL CARE Performed by: Glori Bickers  Total critical care time: 35 minutes  Critical care time was exclusive of separately billable procedures and treating other patients.  Critical care was necessary to treat or prevent imminent or life-threatening deterioration.  Critical care was time spent personally by me (independent of midlevel providers or residents) on the following activities: development of treatment plan with patient and/or surrogate as well as nursing, discussions with consultants, evaluation of patient's response to treatment, examination of patient, obtaining history from patient or surrogate, ordering and performing treatments and interventions, ordering and review of laboratory studies, ordering and review of radiographic studies, pulse oximetry and re-evaluation of patient's condition.  Glori Bickers, MD  2:16 PM

## 2022-11-22 NOTE — Progress Notes (Signed)
[facility] ADULT ICU REPLACEMENT PROTOCOL   The patient does apply for the [facility] Adult ICU Electrolyte Replacment Protocol based on the criteria listed below:   1.Exclusion criteria: TCTS, ECMO, Dialysis, and Myasthenia Gravis patients 2. Is GFR >/= 30 ml/min? Yes.    Patient's GFR today is >60 3. Is SCr </= 2? Yes.   Patient's SCr is 0.72 mg/dL 4. Did SCr increase >/= 0.5 in 24 hours? No. 5.Pt's weight >40kg  Yes.   6. Abnormal electrolyte(s): potassium 3.3  7. Electrolytes replaced per protocol 8.  Call MD STAT for K+ </= 2.5, Phos </= 1, or Mag </= 1 Physician:  protocol  Darlys Gales 11/22/2022 6:03 AM

## 2022-11-22 NOTE — Progress Notes (Signed)
NAME:  Cynthia Underwood, MRN:  [id number], DOB:  1971-05-09, LOS: 8 ADMISSION DATE:  11/11/2022 CONSULTATION DATE:  11/14/2022 REFERRING MD:  Wyline Copas - TRH CHIEF COMPLAINT:  Vfib arrest on floor   History of Present Illness:   52 year old woman who initially presented to [facility] 3/9 for cough and progressive dyspnea x 4 days. Also reported diarrhea and 1 episode of epigastric pain with vomiting. PMHx significant for RBBB/high-grade heart block (s/p PPM), hypothyroidism, sickle cell trait, myotonic dystrophy (dx age 53), tobacco use.  Noted to be hypoxic in ED with O2 sat 88% on RA, CTA Chest c/f LLL PNA with mild bronchiectasis, negative for PE. Also c/f esophagitis. Patient was empirically started on CAP coverage (ceftriaxone, azithromycin) and GERD treatment (PPI/sucralfate). Also found to be positive for human metapneumovirus. Admitted to [facility].  Patient was initially progressing as expected 3/11-3/12 until ~1400, sat was noted to be in 80s. Placed on 5L humidified oxygen with slight improvement in sats. Solumedrol and DuoNebs were ordered. Around 1900 patient's RN was informed by telemetry of VF and was found unresponsive and pulseless. Code Blue was called with CPR x 15 minutes and ROSC. Amiodarone, Epi and defib were administered. Patient was intubated by anesthesia peri-code. IO was placed and Levophed was initiated. Cardiology was consulted.  PCCM was consulted for ICU admission post-arrest.  Pertinent Medical History:   Past Medical History:  Diagnosis Date   Bifascicular block 10/02/2014   Cataract    Dyspnea    Excess ear wax    Multinodular goiter    Pneumonia    Presence of permanent cardiac pacemaker    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   Sickle cell trait (HCC)    Watery eyes    Left   Weakness of both legs    Significant Hospital Events: Including procedures, antibiotic start and stop dates in addition to other pertinent events    3/9 - Presented to [facility] for LLL PNA. Started on Surveyor, quantity. +human metapneumovirus. 3/12 - Hypoxic requiring 5LNC. Later VF arrest on floor, CPR 3/13 echocardiogram LVEF severely depressed 50% (decreased compared with 01/2021), severely reduced RV function 3/14 Cardiac catheterization showed normal coronaries, new normal filling pressures and normal PA pressure.  With excellent cardiac output 3/15 Extubated.  3/16 Reintubated for increased work of breathing, failed BiPAP 3/18 alert, interactive off vasopressors  Interim History / Subjective:   Tolerated brief trial pressure support 5/5 this afternoon.  Reliably in the mornings has difficulty initiating breath, has to be coached.  Difficulty weaning that she has intermittent or inconsistent effort.  Ongoing goals of care.  Objective:  Blood pressure (!) 80/58, pulse 69, temperature 98.3 F (36.8 C), temperature source Axillary, resp. rate 19, height 5\' 3"  (1.6 m), weight 40.3 kg, last menstrual period 12/03/2016, SpO2 97 %. CVP:  [5 mmHg-12 mmHg] 7 mmHg  Vent Mode: CPAP;PSV FiO2 (%):  [40 %] 40 % Set Rate:  [24 bmp] 24 bmp Vt Set:  [410 mL] 410 mL PEEP:  [5 cmH20] 5 cmH20 Pressure Support:  [10 [id number] cmH20] 10 cmH20 Plateau Pressure:  [22 cmH20] 22 cmH20   Intake/Output Summary (Last 24 hours) at 11/22/2022 1529 Last data filed at 11/22/2022 1500 Gross per 24 hour  Intake 1815.71 ml  Output 1865 ml  Net -49.29 ml    Filed Weights   11/20/22 0402 11/21/22 0500 11/22/22 0353  Weight: 43.8 kg 41.7 kg 40.3 kg   Physical Examination: BP (!) 80/58  Pulse 69   Temp 98.3 F (36.8 C) (Axillary)   Resp 19   Ht 5\' 3"  (1.6 m)   Wt 40.3 kg   LMP 12/03/2016 (Approximate)   SpO2 97%   BMI 15.74 kg/m  Gen:      No acute distress, frail, malnourished HEENT:  EOMI, sclera anicteric Neck:     No masses; no thyromegaly, ET tube Lungs:    Coarse ventilated sounds CV:         Regular rate and rhythm; no murmurs Abd:      +  bowel sounds; soft, non-tender; no palpable masses, no distension Ext:    No edema; adequate peripheral perfusion Skin:      Warm and dry; no rash Neuro: Sedated   Resolved Hospital Problem List:    Assessment & Plan:  Undifferentiated shock, ?septic vs. cardiogenic post-arrest.  Suspect in part due to need for sedation. --off vasopressors -- minimize sedatives  Post-Vfib arrest Troponinemia History of RBBB and PPM implantation New cardiomyopathy, LVEF 20% --Cardiac cath shows nonischemic cardiomyopathy with clean coronaries, normal filling pressures and no evidence of pulmonary hypertension --Cardiology and EP are following  Acute hypoxemic respiratory failure LLL PNA +Human metapneumovirus infection --Reintubated 3/16 after failing BiPAP, likely due to neuromuscular weakness in the setting of myotonic dystrophy worsened in the setting of myopathy of critical illness -- Consider another trial of extubation versus tracheostomy, discussed with family 3/19 and prefer a trial of extubation but tracheostomy currently within goals of care  Acute metabolic encephalopathy --Multifactorial in the setting of post-arrest, metabolic derangements/acidosis, hypoxia, sedation. --Now improved --wean sedatives  Shock liver, Resolved -- likely in the setting of VF arrest with transaminitis post-code.  Hyperglycemia --Sliding scale insulin   GERD with esophagitis -- Continue PPI  Hypothyroidism -- Continue levothyroxine  Constipation -- Bowel regimen  Best Practice: (right click and "Reselect all SmartList Selections" daily)   Diet/type: NPO w/ meds via tube DVT prophylaxis: Lovenox GI prophylaxis: PPI Lines: Central line and Arterial Line Foley:  N/A Code Status:  full code Last date of multidisciplinary goals of care discussion [Family updated at bedside, 3/19, plan family meeting PM 3/20]  Critical care time:    CRITICAL CARE Performed by: Bonna Gains Aysa Larivee   Total  critical care time: 31 minutes  Critical care time was exclusive of separately billable procedures and treating other patients.  Critical care was necessary to treat or prevent imminent or life-threatening deterioration.  Critical care was time spent personally by me on the following activities: development of treatment plan with patient and/or surrogate as well as nursing, discussions with consultants, evaluation of patient's response to treatment, examination of patient, obtaining history from patient or surrogate, ordering and performing treatments and interventions, ordering and review of laboratory studies, ordering and review of radiographic studies, pulse oximetry and re-evaluation of patient's condition.   Lanier Clam, MD [facility] See Amion for pager  If no response to pager , please call [phone] until 7pm After 7:00 pm call Elink  [phone] 11/22/2022, 3:29 PM

## 2022-11-22 NOTE — Procedures (Signed)
  Interdisciplinary Goals of Care Family Meeting   Date carried out:: 11/22/2022  Location of the meeting: Bedside  Member's involved: Nurse Practitioner, Bedside Registered Nurse, and Family Member or next of kin  Durable Power of Attorney or acting medical decision maker: Patients sister, Cynthia Underwood, Mother, Cynthia Underwood, was also present   Discussion: Cynthia Underwood continues to require intubation and ventilator support but today she is tolerating weaning trial with slow down titration of vent support. Therefore, GOC discussion was held with patient, sister, and mother at bedside. Patient shrugs her shoulders when asked "would you want a tracheostomy tube in the event you are unable to tolerate extubation". I then asked [name] who would make her decision for her in the event she is unable to and she indicated with pointing that her sister would be her medical power of attorney. Family and patient decided to continue current plan of care with plans to reevaluate [place] tomorrow 3/21      Code status: Full Code  Disposition: Continue current acute care  Time spent for the meeting: 35   Keric Zehren D. Harris 11/22/2022, 4:19 PM

## 2022-11-23 ENCOUNTER — Inpatient Hospital Stay (HOSPITAL_COMMUNITY): Payer: 59

## 2022-11-23 DIAGNOSIS — I469 Cardiac arrest, cause unspecified: Secondary | ICD-10-CM | POA: Diagnosis not present

## 2022-11-23 DIAGNOSIS — I5021 Acute systolic (congestive) heart failure: Secondary | ICD-10-CM | POA: Diagnosis not present

## 2022-11-23 DIAGNOSIS — J9690 Respiratory failure, unspecified, unspecified whether with hypoxia or hypercapnia: Secondary | ICD-10-CM

## 2022-11-23 DIAGNOSIS — J189 Pneumonia, unspecified organism: Secondary | ICD-10-CM | POA: Diagnosis not present

## 2022-11-23 LAB — BASIC METABOLIC PANEL
Anion gap: 9 (ref 5–15)
BUN: 13 mg/dL (ref 6–20)
CO2: 23 mmol/L (ref 22–32)
Calcium: 8.7 mg/dL — ABNORMAL LOW (ref 8.9–10.3)
Chloride: 110 mmol/L (ref 98–111)
Creatinine, Ser: 0.7 mg/dL (ref 0.44–1.00)
GFR, Estimated: >60 mL/min (ref >60)
Glucose, Bld: 122 mg/dL — ABNORMAL HIGH (ref 70–99)
Potassium: 3.9 mmol/L (ref 3.5–5.1)
Sodium: 142 mmol/L (ref 135–145)

## 2022-11-23 LAB — GLUCOSE, CAPILLARY
Glucose-Capillary: 103 mg/dL — ABNORMAL HIGH (ref 70–99)
Glucose-Capillary: 137 mg/dL — ABNORMAL HIGH (ref 70–99)
Glucose-Capillary: 130 mg/dL — ABNORMAL HIGH (ref 70–99)
Glucose-Capillary: 139 mg/dL — ABNORMAL HIGH (ref 70–99)
Glucose-Capillary: 135 mg/dL — ABNORMAL HIGH (ref 70–99)
Glucose-Capillary: 140 mg/dL — ABNORMAL HIGH (ref 70–99)

## 2022-11-23 LAB — CBC
HCT: 25 % — ABNORMAL LOW (ref 36.0–46.0)
Hemoglobin: 8.1 g/dL — ABNORMAL LOW (ref 12.0–15.0)
MCH: 32.1 pg (ref 26.0–34.0)
MCHC: 32.4 g/dL (ref 30.0–36.0)
MCV: 99.2 fL (ref 80.0–100.0)
Platelets: 256 10*3/uL (ref 150–400)
RBC: 2.52 MIL/uL — ABNORMAL LOW (ref 3.87–5.11)
RDW: 16.9 % — ABNORMAL HIGH (ref 11.5–15.5)
WBC: 14.4 10*3/uL — ABNORMAL HIGH (ref 4.0–10.5)
nRBC: 0.3 % — ABNORMAL HIGH (ref 0.0–0.2)

## 2022-11-23 LAB — MAGNESIUM: Magnesium: 2 mg/dL (ref 1.7–2.4)

## 2022-11-23 LAB — COOXEMETRY PANEL
Carboxyhemoglobin: 2.6 % — ABNORMAL HIGH (ref 0.5–1.5)
Methemoglobin: <0.7 % (ref 0.0–1.5)
O2 Saturation: 75.9 %
Total hemoglobin: 8.4 g/dL — ABNORMAL LOW (ref 12.0–16.0)

## 2022-11-23 MED ORDER — SODIUM CHLORIDE 0.9 % IV SOLN
1.0000 g | Freq: Two times a day (BID) | INTRAVENOUS | Status: DC
  Administered 2022-11-23: 1 g via INTRAVENOUS
  Filled 2022-11-23 (×3): qty 10

## 2022-11-23 MED ORDER — POTASSIUM CHLORIDE CRYS ER 20 MEQ PO TBCR: 20.0000 meq | EXTENDED_RELEASE_TABLET | Freq: Once | ORAL | Status: DC

## 2022-11-23 MED ORDER — POTASSIUM CHLORIDE 20 MEQ PO PACK
20.0000 meq | PACK | Freq: Once | ORAL | Status: AC
  Administered 2022-11-23: 20 meq via ORAL
  Filled 2022-11-23: qty 1

## 2022-11-23 MED ORDER — BANATROL TF EN LIQD
60.0000 mL | Freq: Two times a day (BID) | ENTERAL | Status: DC
  Administered 2022-11-23 – 2022-11-29 (×13): 60 mL
  Filled 2022-11-23 (×12): qty 60

## 2022-11-23 MED ORDER — NUTRISOURCE FIBER PO PACK
1.0000 | PACK | Freq: Two times a day (BID) | ORAL | Status: DC
  Administered 2022-11-23: 1
  Filled 2022-11-23 (×2): qty 1

## 2022-11-23 MED ORDER — BETHANECHOL CHLORIDE 10 MG PO TABS
10.0000 mg | ORAL_TABLET | Freq: Three times a day (TID) | ORAL | Status: AC
  Administered 2022-11-23 – 2022-11-26 (×9): 10 mg
  Filled 2022-11-23 (×9): qty 1

## 2022-11-23 NOTE — Evaluation (Signed)
Physical Therapy Evaluation Patient Details Name: Cynthia Underwood MRN: [id number] DOB: 1971/06/29 Today's Date: 11/23/2022  History of Present Illness  52 y.o. admitted 3/9 with Left lower lobe community-acquired pneumonia, Acute hypoxic respiratory failure.   Pt with Vfib arrest on 3/12 with CPR and VDRF wtih transfer to ICU. Extubated 3/15 and re-intubated 3/16.    Past medical history significant for hypothyroidism, RBBB/high-grade heart block (s/p PPM),  sickle cell trait, myotonic dystrophy (dx age 63), tobacco use  Clinical Impression  Pt admitted with above diagnosis. Pt able to tolerate chair position in bed during treatment to perform exercises and trunk activity.  Left in chair position at 50 degrees.  Pt tearful during session and appeared frustrated.  Pt deconditioned and will benefit from skilled therapy > 3 hours to return to prior functional level. Will continue to follow acutely.   Pt currently with functional limitations due to the deficits listed below (see PT Problem List). Pt will benefit from skilled PT to increase their independence and safety with mobility to allow discharge to the venue listed below.          Recommendations for follow up therapy are one component of a multi-disciplinary discharge planning process, led by the attending physician.  Recommendations may be updated based on patient status, additional functional criteria and insurance authorization.  Follow Up Recommendations Acute inpatient rehab (3hours/day)      Assistance Recommended at Discharge Frequent or constant Supervision/Assistance  Patient can return home with the following  A lot of help with walking and/or transfers;A lot of help with bathing/dressing/bathroom;Assistance with cooking/housework;Assist for transportation;Help with stairs or ramp for entrance    Equipment Recommendations Other (comment) (TBA)  Recommendations for Other Services  Rehab consult    Functional Status Assessment  Patient has had a recent decline in their functional status and demonstrates the ability to make significant improvements in function in a reasonable and predictable amount of time.     Precautions / Restrictions Precautions Precautions: Fall Precaution Comments: monitor O2 Restrictions Weight Bearing Restrictions: No      Mobility  Bed Mobility               General bed mobility comments: Pt did not want to sit EOB therefore inclined bed to chair position.  Pt worked on leaning trunk forward x 4 but could not maintain for more than a few seconds without assist. Pt also performed some exercises. Pt tearful and motioning that she wants tube removed.  Pt appears very frustrated.    Transfers                   General transfer comment: TBA    Ambulation/Gait                  Stairs            Wheelchair Mobility    Modified Rankin (Stroke Patients Only)       Balance                                             Pertinent Vitals/Pain Pain Assessment Pain Assessment: No/denies pain    Home Living Family/patient expects to be discharged to:: Private residence Living Arrangements: Other relatives Available Help at Discharge: Family;Available PRN/intermittently Type of Home: House Home Access: Level entry     Alternate Level Stairs-Number of Steps: flight  Home Layout: Bed/bath upstairs;Two level Home Equipment: None      Prior Function Prior Level of Function : Independent/Modified Independent                     Hand Dominance   Dominant Hand: Right    Extremity/Trunk Assessment   Upper Extremity Assessment Upper Extremity Assessment: Defer to OT evaluation    Lower Extremity Assessment Lower Extremity Assessment: Generalized weakness    Cervical / Trunk Assessment Cervical / Trunk Assessment: Kyphotic  Communication   Communication: No difficulties  Cognition Arousal/Alertness:  Awake/alert Behavior During Therapy: WFL for tasks assessed/performed Overall Cognitive Status: Difficult to assess                                          General Comments General comments (skin integrity, edema, etc.): VSS on vent at 40% FiO2 and PEEP 5    Exercises General Exercises - Upper Extremity Shoulder Flexion: AROM, Both, 5 reps, Seated Elbow Flexion: AROM, Both, 5 reps, Seated Elbow Extension: AROM, Both, 5 reps, Seated General Exercises - Lower Extremity Ankle Circles/Pumps: AROM, Both, 5 reps, Supine Quad Sets: AROM, Both, 5 reps, Supine Heel Slides: AROM, Both, 5 reps, Supine   Assessment/Plan    PT Assessment Patient needs continued PT services  PT Problem List Decreased strength;Decreased activity tolerance;Decreased balance;Decreased mobility;Decreased knowledge of use of DME;Cardiopulmonary status limiting activity       PT Treatment Interventions DME instruction;Gait training;Stair training;Functional mobility training;Therapeutic activities;Therapeutic exercise;Balance training;Neuromuscular re-education;Patient/family education    PT Goals (Current goals can be found in the Care Plan section)  Acute Rehab PT Goals Patient Stated Goal: get well PT Goal Formulation: With patient Time For Goal Achievement: 12/07/22 Potential to Achieve Goals: Good    Frequency Min 3X/week     Co-evaluation               AM-PAC PT "6 Clicks" Mobility  Outcome Measure Help needed turning from your back to your side while in a flat bed without using bedrails?: Total Help needed moving from lying on your back to sitting on the side of a flat bed without using bedrails?: Total Help needed moving to and from a bed to a chair (including a wheelchair)?: Total Help needed standing up from a chair using your arms (e.g., wheelchair or bedside chair)?: Total Help needed to walk in hospital room?: Total Help needed climbing 3-5 steps with a railing? :  Total 6 Click Score: 6    End of Session Equipment Utilized During Treatment:  (Vent) Activity Tolerance: Patient limited by fatigue Patient left: in bed;with call bell/phone within reach;with bed alarm set (Pt placed in 50 degree chair position) Nurse Communication: Mobility status PT Visit Diagnosis: Unsteadiness on feet (R26.81);Other abnormalities of gait and mobility (R26.89);Muscle weakness (generalized) (M62.81)    Time: [id number] PT Time Calculation (min) (ACUTE ONLY): 25 min   Charges:   PT Evaluation $PT Eval Moderate Complexity: 1 Mod PT Treatments $Therapeutic Activity: 8-22 mins        [facility] M,PT Acute Rehab Services [phone]   [name] 11/23/2022, 1:54 PM

## 2022-11-23 NOTE — Progress Notes (Addendum)
Advanced Heart Failure Rounding Note  PCP-Cardiologist: Fransico Him, MD   Subjective:    Cath 3/14 c/w nonischemic cardiomyopathy, filling pressures minimally elevated on RHC. Excellent cardiac output by Fick. Extubated 3/15. Reintubated (3/16)  CO-OX 76%.   Still intubated, awake, following commands.   Objective:   Weight Range: 40.3 kg Body mass index is 15.74 kg/m.   Vital Signs:   Temp:  [98.3 F (36.8 C)-100.2 F (37.9 C)] 100.2 F (37.9 C) (03/21 0333) Pulse Rate:  [69-95] 73 (03/21 0300) Resp:  [13-25] 24 (03/21 0300) BP: (80-115)/(55-77) 101/63 (03/21 0300) SpO2:  [77 %-100 %] 99 % (03/21 0401) FiO2 (%):  [40 %] 40 % (03/21 0401) Last BM Date : 11/22/22  Weight change: Filed Weights   11/20/22 0402 11/21/22 0500 11/22/22 0353  Weight: 43.8 kg 41.7 kg 40.3 kg    Intake/Output:   Intake/Output Summary (Last 24 hours) at 11/23/2022 0706 Last data filed at 11/23/2022 0600 Gross per 24 hour  Intake 1795.15 ml  Output 1550 ml  Net 245.15 ml     CVP 5 Physical Exam  General:  intubated HEENT: +cortrack Neck: supple. No JVD. Carotids 2+ bilat; no bruits. No lymphadenopathy or thyromegaly appreciated. 3L CVC LIJ Cor: PMI nondisplaced. Regular rate & rhythm. No rubs, gallops or murmurs. Lungs: clear, diminished bases Abdomen: soft, nontender, nondistended. No hepatosplenomegaly. No bruits or masses. Good bowel sounds. Extremities: no cyanosis, clubbing, rash, edema  Neuro: intubated, follows commands and nods appropriately  Telemetry  A paved 70s  No VT (Personally reviewed)   Labs    CBC Recent Labs    11/21/22 0112 11/22/22 0323 11/23/22 0325  WBC 12.6* 11.7* 14.4*  NEUTROABS 10.1* 8.7*  --   HGB 9.7* 8.6* 8.1*  HCT 29.2* 26.5* 25.0*  MCV 98.0 99.3 99.2  PLT 267 259 [id number]   Basic Metabolic Panel Recent Labs    11/21/22 0112 11/21/22 1313 11/22/22 0323 11/23/22 0325  NA 148*   < > 148* 142  K 4.1   < > 3.3* 3.9  CL 116*   < > 112*  110  CO2 22   < > 24 23  GLUCOSE 120*   < > 126* 122*  BUN 20   < > 12 13  CREATININE 0.88   < > 0.72 0.70  CALCIUM 8.5*   < > 8.5* 8.7*  MG 2.2  --  2.0 2.0  PHOS 4.4  --   --   --    < > = values in this interval not displayed.   Liver Function Tests Recent Labs    11/21/22 0112  AST 29  ALT 35  ALKPHOS 60  BILITOT 0.6  PROT 6.4*  ALBUMIN 2.2*   No results for input(s): "LIPASE", "AMYLASE" in the last 72 hours. Cardiac Enzymes No results for input(s): "CKTOTAL", "CKMB", "CKMBINDEX", "TROPONINI" in the last 72 hours.  BNP: BNP (last 3 results) Recent Labs    11/11/22 1238 11/17/22 2150  BNP 58.5 145.2*    ProBNP (last 3 results) No results for input(s): "PROBNP" in the last 8760 hours.   D-Dimer No results for input(s): "DDIMER" in the last 72 hours. Hemoglobin A1C No results for input(s): "HGBA1C" in the last 72 hours.  Fasting Lipid Panel No results for input(s): "CHOL", "HDL", "LDLCALC", "TRIG", "CHOLHDL", "LDLDIRECT" in the last 72 hours. Thyroid Function Tests No results for input(s): "TSH", "T4TOTAL", "T3FREE", "THYROIDAB" in the last 72 hours.  Invalid input(s): "FREET3"  Other results:  Imaging    DG Chest Port 1 View  Result Date: 11/22/2022 CLINICAL DATA:  Respiratory failure.  Ventilator support. EXAM: PORTABLE CHEST 1 VIEW COMPARISON:  11/18/2022 FINDINGS: Endotracheal tube at the carina. Consider withdrawal by 1-2 cm. Soft feeding tube enters the stomach with its tip in the fundus or proximal body. Pacemaker leads appear unchanged. Left internal jugular central line tip at the SVC RA junction. Improved aeration of both lower lobes since the previous examination. Mild persistent atelectasis/infiltrate. No worsening or new finding. IMPRESSION: 1. Endotracheal tube at the carina. Consider withdrawal by 1-2 cm. 2. Improved aeration of both lower lobes since the previous examination. Mild persistent atelectasis/infiltrate. Electronically Signed    By: Nelson Chimes M.D.   On: 11/22/2022 12:11     Medications:     Scheduled Medications:  Chlorhexidine Gluconate Cloth  6 each Topical Daily   enoxaparin (LOVENOX) injection  30 mg Subcutaneous Q24H   feeding supplement (PROSource TF20)  60 mL Per Tube Daily   free water  200 mL Per Tube Q4H   insulin aspart  0-9 Units Subcutaneous Q4H   levothyroxine  112 mcg Per Tube QAC breakfast   magnesium oxide  400 mg Per Tube Daily   mouth rinse  15 mL Mouth Rinse Q2H   pantoprazole (PROTONIX) IV  40 mg Intravenous QHS   sodium chloride flush  3 mL Intravenous Q12H   sucralfate  1 g Per Tube BID    Infusions:  sodium chloride     dexmedetomidine (PRECEDEX) IV infusion 0.8 mcg/kg/hr (11/23/22 0645)   feeding supplement (VITAL 1.5 CAL) 45 mL/hr at 11/23/22 0600   fentaNYL infusion INTRAVENOUS 25 mcg/hr (11/23/22 0600)    PRN Medications: sodium chloride, acetaminophen, docusate, fentaNYL, guaiFENesin, ipratropium-albuterol, melatonin, midazolam, mouth rinse, mouth rinse, mouth rinse, polyethylene glycol, sodium chloride flush    Patient Profile   52 y.o. female with a hx of tobacco use, COPD, myotonic dystrophy type I since age 83, high grade heart block s/p PPM, RBBB, sickle cell trait, and DM1 (questionable diagnosis) and strong family history of heart failure (father s/p cardiac transplant and paternal aunt w/ NICM w/ LVAD), admitted w/ acute hypoxic respiratory failure/ LLL CAP secondary to metapneumovirus. Hospital course c/b VF arrest. Post arrest echo w/ severe BiV failure, LVEF < 20%, RV severely reduced + diffuse HK   Assessment/Plan  1. VF arrest: 3/12, shocked. 15 minutes CPR total.  - Due to long-short interval (R-on-T) in the setting of QT-prolonging drugs (azithro) and hypokalemia - Pacemaker lower rate limit has been increased to prevent pause-dependent TdP; this should be reprogrammed to a more physiologic rate if/when approaching discharge. - Per EP: may require upgrade  with LV lead +/-upgrade to primary prevention ICD if EF does not recover with GDMT. VT this admission was attributable to a reversible cause, so ICD not indicated urgently for secondary prevention. Can consider placement of LifeVest on discharge.  - No arrhythmias.  - Keep K> 4.0 Mg > 2.0 - avoid QT prolonging agents  2. Acute hypoxemic respiratory failure: Intubated post-VF arrest.  Had viral PNA with metapneumovirus at presentation.  - extubated 3/14 Reintubated 3/16 ? Possible trach, family discussion ongoing.  - CCM managing.   3. Acute systolic CHF: Prior workup for cardiac sarcoidosis. Cardiac MRI in 2017 showed subendocardial LGE in the mid inferior and inferolateral walls with EF 56%.  Cardiac PET done after this was NOT suggestive of cardiac sarcoidosis.  - Echo in 5/22 with normal EF.   -  Echo this admission reviewed, EF 20-25% range with diffuse hypokinesis but relatively preserved function at the apex, moderate RV dysfunction.  - cath 3/15 no CAD - suspect due to myotonic dystrophy type 1 -> she has an aunt with LVAD who has cardiomyopathy due to myotonic dystrophy.  Father had a heart transplant, uncertain of underlying condition. R/LHC c/w Nonischemic cardiomyopathy.  - CVP 5. Does not need diuretics.  - Off pressors. No room for GDMT.  - Given overall debility, she is not currently a candidate for advanced therapies - Would consider palliative care input    4. High grade heart block: Has MDT PPM with His bundle lead.  This is likely due to cardiac involvement of myotonic dystrophy type 1.  - plan as above  5. Myotonic dystrophy: Type 1, diagnosed at age 29. Has family members with the same diagnosis.  Followed by neurology.   6. Tobacco abuse - needs cessation  7. Hypokalemia/hypomag - supp K  - Mag ok.   Length of Stay: [place], NP  11/23/2022, 7:06 AM  Advanced Heart Failure Team Pager [phone] (M-F; 7a - 5p)  Please contact [facility] for  night-coverage after hours (5p -7a ) and weekends on [email or web address]   Agree with above.  Remains on vent. Awake. Following commands. Failed SBT today.   A paced at back-up rate 70 today.   General:  On vent. Weak cachetic HEENT: normal + TEE Neck: supple. no JVD. Carotids 2+ bilat; no bruits. No lymphadenopathy or thryomegaly appreciated. Cor: PMI nondisplaced. Regular rate & rhythm. No rubs, gallops or murmurs. Lungs: clear Abdomen: soft, nontender, nondistended. No hepatosplenomegaly. No bruits or masses. Good bowel sounds. Extremities: no cyanosis, clubbing, rash, edema Neuro: awake following commands  She has failed multiple extubation attempts. D/w CCM. She will need trach. Pacer rate set with back-up at 70. No VT. HF currently stable. Will continue to titrate GDMT.   CRITICAL CARE Performed by: Glori Bickers  Total critical care time: 45 minutes  Critical care time was exclusive of separately billable procedures and treating other patients.  Critical care was necessary to treat or prevent imminent or life-threatening deterioration.  Critical care was time spent personally by me (independent of midlevel providers or residents) on the following activities: development of treatment plan with patient and/or surrogate as well as nursing, discussions with consultants, evaluation of patient's response to treatment, examination of patient, obtaining history from patient or surrogate, ordering and performing treatments and interventions, ordering and review of laboratory studies, ordering and review of radiographic studies, pulse oximetry and re-evaluation of patient's condition.  Glori Bickers, MD  12:19 PM

## 2022-11-23 NOTE — Progress Notes (Signed)
Bilateral lower extremity venous study completed.   Preliminary results relayed to RN.  Please see CV Procedures for preliminary results.  Akiya Morr, RVT  10:57 AM 11/23/22

## 2022-11-23 NOTE — IPAL (Signed)
  Interdisciplinary Goals of Care Family Meeting   Date carried out:: 11/23/2022  Location of the meeting: Unit  Member's involved: Physician, Nurse Practitioner, Bedside Registered Nurse, and Family Member or next of kin  Durable Power of Attorney or acting medical decision maker: Patients sister, Deedra Ehrich, Mother, Pura Spice, was also present     Discussion: Dr. Silas Flood and I met with patient, Deedra Ehrich (sister and medical POA), and Claudette (mother) at beside with decision made to proceed directly with tracheostomy tube. This will be arranged. For now continue current plan of care.   Code status: Full Code  Disposition: Continue current acute care   Time spent for the meeting: 25 min  Taura Lamarre D. Harris 11/23/2022, 4:42 PM

## 2022-11-23 NOTE — Progress Notes (Signed)
Tylenol given per [facility] for fever

## 2022-11-23 NOTE — Plan of Care (Signed)
  Problem: Education: Goal: Knowledge of General Education information will improve Description: Including pain rating scale, medication(s)/side effects and non-pharmacologic comfort measures Outcome: Not Progressing   Problem: Health Behavior/Discharge Planning: Goal: Ability to manage health-related needs will improve Outcome: Not Progressing   Problem: Clinical Measurements: Goal: Ability to maintain clinical measurements within normal limits will improve Outcome: Not Progressing Goal: Will remain free from infection Outcome: Not Progressing Goal: Diagnostic test results will improve Outcome: Not Progressing Goal: Respiratory complications will improve Outcome: Not Progressing   Problem: Activity: Goal: Risk for activity intolerance will decrease Outcome: Not Progressing   

## 2022-11-23 NOTE — Progress Notes (Addendum)
eLink Physician-Brief Progress Note Patient Name: FLORIANA CORBRIDGE DOB: 01-Jun-1971 MRN: [id number]   Date of Service  11/23/2022  HPI/Events of Note  52 year old female presented with undifferentiated shock septic versus cardiogenic post V-fib arrest with new cardiomyopathy.  Thought to have a human metapneumovirus left lower lobe pneumonia.  Now developing fever to 103.2.  Mild leukocytosis this morning.  Has been having increased ear pain.  Fever abated with Tylenol.  eICU Interventions  Obtain urinalysis, blood cultures, procalcitonin  Replace Foley catheter.  Given history of ear infections, would be reasonable to start on antibiotics-unfortunately difficult to assess remotely.  Cefepime.  Obtain sinus imaging.   2046 -patient was extremely difficult to get fully into and currently has a 10 [place] after multiple attempts.  Better to hold on exchange at this time.  Will defer urinalysis for now.  Unable to obtain sinus imaging due to inability to do so portable.  Will continue with empiric treatment.     Armani Brar 11/23/2022, 8:14 PM

## 2022-11-23 NOTE — Progress Notes (Signed)
PHARMACY NOTE:  ANTIMICROBIAL RENAL DOSAGE ADJUSTMENT  Current antimicrobial regimen includes a mismatch between antimicrobial dosage and estimated renal function.  As per policy approved by the Pharmacy & Therapeutics and Medical Executive Committees, the antimicrobial dosage will be adjusted accordingly.  Current antimicrobial dosage:  Cefepime 2g IV q24  Indication: r/o ear infection  Renal Function:  Estimated Creatinine Clearance: 51.7 mL/min (by C-G formula based on SCr of 0.7 mg/dL). []      On intermittent HD, scheduled: []      On CRRT    Antimicrobial dosage has been changed to:  Cefepime 1g IV q12  Additional comments: Will use modified dose due to low wt.  Onnie Boer, PharmD, BCIDP, AAHIVP, CPP Infectious Disease Pharmacist 11/23/2022 8:27 PM

## 2022-11-23 NOTE — Progress Notes (Signed)
NAME:  Cynthia Underwood, MRN:  [id number], DOB:  10/11/1970, LOS: 9 ADMISSION DATE:  11/11/2022 CONSULTATION DATE:  11/14/2022 REFERRING MD:  Wyline Copas - TRH CHIEF COMPLAINT:  Vfib arrest on floor   History of Present Illness:   52 year old woman who initially presented to [facility] 3/9 for cough and progressive dyspnea x 4 days. Also reported diarrhea and 1 episode of epigastric pain with vomiting. PMHx significant for RBBB/high-grade heart block (s/p PPM), hypothyroidism, sickle cell trait, myotonic dystrophy (dx age 69), tobacco use.  Noted to be hypoxic in ED with O2 sat 88% on RA, CTA Chest c/f LLL PNA with mild bronchiectasis, negative for PE. Also c/f esophagitis. Patient was empirically started on CAP coverage (ceftriaxone, azithromycin) and GERD treatment (PPI/sucralfate). Also found to be positive for human metapneumovirus. Admitted to [facility].  Patient was initially progressing as expected 3/11-3/12 until ~1400, sat was noted to be in 80s. Placed on 5L humidified oxygen with slight improvement in sats. Solumedrol and DuoNebs were ordered. Around 1900 patient's RN was informed by telemetry of VF and was found unresponsive and pulseless. Code Blue was called with CPR x 15 minutes and ROSC. Amiodarone, Epi and defib were administered. Patient was intubated by anesthesia peri-code. IO was placed and Levophed was initiated. Cardiology was consulted.  PCCM was consulted for ICU admission post-arrest.  Pertinent Medical History:   Past Medical History:  Diagnosis Date   Bifascicular block 10/02/2014   Cataract    Dyspnea    Excess ear wax    Multinodular goiter    Pneumonia    Presence of permanent cardiac pacemaker    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   Sickle cell trait (HCC)    Watery eyes    Left   Weakness of both legs    Significant Hospital Events: Including procedures, antibiotic start and stop dates in addition to other pertinent events    3/9 - Presented to [facility] for LLL PNA. Started on Surveyor, quantity. +human metapneumovirus. 3/12 - Hypoxic requiring 5LNC. Later VF arrest on floor, CPR 3/13 echocardiogram LVEF severely depressed 50% (decreased compared with 01/2021), severely reduced RV function 3/14 Cardiac catheterization showed normal coronaries, new normal filling pressures and normal PA pressure.  With excellent cardiac output 3/15 Extubated.  3/16 Reintubated for increased work of breathing, failed BiPAP 3/18 alert, interactive off vasopressors 3/20 Wean, trial extubation today  3/21 No acute issues overnight, failed early am wean due to apnea on 50 Fentanyl and 0.8 Pecedex   Interim History / Subjective:  Wakes to verbal stimuli Remains weak and deconditioned  Good urine output but remains 4L + for admission   Objective:  Blood pressure 101/63, pulse 73, temperature 100.2 F (37.9 C), temperature source Oral, resp. rate (!) 24, height 5\' 3"  (1.6 m), weight 40.3 kg, last menstrual period 12/03/2016, SpO2 99 %. CVP:  [5 mmHg-10 mmHg] 6 mmHg  Vent Mode: PRVC FiO2 (%):  [40 %] 40 % Set Rate:  [24 bmp] 24 bmp Vt Set:  [410 mL] 410 mL PEEP:  [5 cmH20] 5 cmH20 Pressure Support:  [5 [id number] cmH20] 5 cmH20 Plateau Pressure:  [2 cmH20-21 cmH20] 20 cmH20   Intake/Output Summary (Last 24 hours) at 11/23/2022 0701 Last data filed at 11/23/2022 0600 Gross per 24 hour  Intake 1795.15 ml  Output 1550 ml  Net 245.15 ml    Filed Weights   11/20/22 0402 11/21/22 0500 11/22/22 0353  Weight: 43.8 kg 41.7 kg 40.3 kg  Physical Examination: General: Acute on chronically ill appearing middle female lying in bed in NAD HEENT: ETT, MM pink/moist, PERRL,  Neuro: Wakes to verbal stimuli, no increased work of breathing on vent, apnea this am on SBT CV: s1s2 regular rate and rhythm, no murmur, rubs, or gallops,  PULM:  Clear to auscultation, no increased work of breathing, no added breath sounds  GI: soft, bowel sounds active  in all 4 quadrants, non-tender, non-distended, tolerating TF Extremities: warm/dry, no edema  Skin: no rashes or lesions  Resolved Hospital Problem List:  Undifferentiated shock, ?septic vs. cardiogenic post-arrest.   -Received 6 days IV Ceftriaxone  LLL PNA -Received 5 days of IV antibiotics with resolution in fever Hypernatremia  Assessment & Plan:   Post-Vfib arrest -Due to long-short interval (R-on-T) in the setting of QT-prolonging med and hypokalemia Troponinemia History of RBBB and PPM implantation -Pacemaker rate limit was increased to prevent pause-dependent tdP, per cardiology this should be decreased when/if approaching discharge   New cardiomyopathy, LVEF 20% -Cardiac cath shows nonischemic cardiomyopathy with clean coronaries, normal filling pressures and no evidence of pulmonary hypertension. Cardiomyopathy more than likely related to myotonic dystrophy type 1 per cardiology  -Per EP may need to consider ICD but currently no indications as arrest was felt secondary to reversible causes P: Continuous telemetry  Optimize electrolytes, K > 4, Mg > 2 Strict intake and output  Daily weight to assess volume status Daily assessment for need to diurese  Avoid QT prolonging medication  Minimize sedation   Acute hypoxemic respiratory failure +Human metapneumovirus infection -Reintubated 3/16 after failing BiPAP, likely due to neuromuscular weakness in the setting of myotonic dystrophy worsened in the setting of myopathy of critical illness P:  Continue ventilator support with lung protective strategies  Wean PEEP and FiO2 for sats greater than 90%. Head of bed elevated 30 degrees. Plateau pressures less than 30 cm H20.  Follow intermittent chest x-ray and ABG.   SAT/SBT as tolerated, mentation preclude extubation  Ensure adequate pulmonary hygiene  Follow cultures  VAP bundle in place  PAD protocol Need to formalize airway plan//tracheostomy decision prior to  re-attempt at extubation   Acute metabolic encephalopathy Multifactorial in the setting of post-arrest, metabolic derangements/acidosis, hypoxia, sedation. Continue to improve  P: Minimize sedation as able  Maintain neuro protective measures Nutrition and bowel regiment  Aspirations precautions  Delirium precautions   Fever -Tmax 101.8 3/20, repeat CXR improved and tracheal aspirate collected 3/20 P: Continue to trend CBC and fever curve off ABT  Hyperglycemia CBGs consistently in the 120s  P:  Continue SSI  GERD with esophagitis P: Continue IV Protonix   Hypothyroidism P: Continue Levothyroxine   Constipation Patient having liquid stools P: Stop laxative  Start fiber    Best Practice: (right click and "Reselect all SmartList Selections" daily)   Diet/type: NPO w/ meds via tube DVT prophylaxis: Lovenox GI prophylaxis: PPI Lines: Central line and Arterial Line Foley:  N/A Code Status:  full code Last date of multidisciplinary goals of care discussion [Will update family at beside]  Critical care time:    CRITICAL CARE Performed by: Asael Pann D. Harris BSN, RN, S-ACNP    Total critical care time: 38 minutes  Critical care time was exclusive of separately billable procedures and treating other patients.  Critical care was necessary to treat or prevent imminent or life-threatening deterioration.  Critical care was time spent personally by me on the following activities: development of treatment plan with patient and/or surrogate as  well as nursing, discussions with consultants, evaluation of patient's response to treatment, examination of patient, obtaining history from patient or surrogate, ordering and performing treatments and interventions, ordering and review of laboratory studies, ordering and review of radiographic studies, pulse oximetry and re-evaluation of patient's condition.  Enola Siebers D. Harris, NP-C [facility] Personal  contact information can be found on Amion  If no contact or response made please call [phone] 11/23/2022, 7:04 AM

## 2022-11-23 NOTE — Progress Notes (Signed)
eLink Physician-Brief Progress Note Patient Name: Cynthia Underwood DOB: [birth year] MRN: [id number]   Date of Service  11/23/2022  HPI/Events of Note  52 year old female with undifferentiated shock thought to be septic versus cardiogenic postarrest.  Post V-fib arrest with now new pacemaker implantation.  Has been treated for left lower lower pneumonia and encephalopathy.  Has been having liquid stools and rectal pouch is ineffective.  No immediate complications no contraindications.  eICU Interventions  Agree with Flexi-Seal for now.  No new fevers or chills, no significant leukocytosis.  Can defer C. difficile testing for now.  Not on stool softeners.  Ongoing tube feeds.  May need added fiber.     Intervention Category Minor Interventions: Routine modifications to care plan (e.g. PRN medications for pain, fever)  Edie Vallandingham 11/23/2022, 3:03 AM

## 2022-11-23 NOTE — Progress Notes (Signed)
Inpatient Rehab Admissions Coordinator:   Per therapy recommendations pt was screened for CIR by Shann Medal, PT, DPT.  Unable to mobilize to EOB at this time.  Will follow for a few more therapy sessions to see if pt may be able to tolerate intensity of CIR.  No consult at this time.   Shann Medal, PT, DPT Admissions Coordinator [phone] 11/23/22  3:17 PM

## 2022-11-23 NOTE — Progress Notes (Signed)
RT NOTE: attempted SBT on patient this AM however patient became apneic and backup ventilation alarmed.  Placed patient back on full support ventilator settings and tolerating well at this time.  Will continue to monitor.

## 2022-11-24 ENCOUNTER — Inpatient Hospital Stay (HOSPITAL_COMMUNITY): Payer: 59

## 2022-11-24 DIAGNOSIS — I5021 Acute systolic (congestive) heart failure: Secondary | ICD-10-CM | POA: Diagnosis not present

## 2022-11-24 DIAGNOSIS — J189 Pneumonia, unspecified organism: Secondary | ICD-10-CM | POA: Diagnosis not present

## 2022-11-24 DIAGNOSIS — I469 Cardiac arrest, cause unspecified: Secondary | ICD-10-CM | POA: Diagnosis not present

## 2022-11-24 LAB — GLUCOSE, CAPILLARY
Glucose-Capillary: 84 mg/dL (ref 70–99)
Glucose-Capillary: 96 mg/dL (ref 70–99)
Glucose-Capillary: 124 mg/dL — ABNORMAL HIGH (ref 70–99)
Glucose-Capillary: 136 mg/dL — ABNORMAL HIGH (ref 70–99)
Glucose-Capillary: 138 mg/dL — ABNORMAL HIGH (ref 70–99)
Glucose-Capillary: 129 mg/dL — ABNORMAL HIGH (ref 70–99)

## 2022-11-24 LAB — BASIC METABOLIC PANEL
Anion gap: 8 (ref 5–15)
BUN: 12 mg/dL (ref 6–20)
CO2: 22 mmol/L (ref 22–32)
Calcium: 8.2 mg/dL — ABNORMAL LOW (ref 8.9–10.3)
Chloride: 107 mmol/L (ref 98–111)
Creatinine, Ser: 0.54 mg/dL (ref 0.44–1.00)
GFR, Estimated: >60 mL/min (ref >60)
Glucose, Bld: 90 mg/dL (ref 70–99)
Potassium: 3.6 mmol/L (ref 3.5–5.1)
Sodium: 137 mmol/L (ref 135–145)

## 2022-11-24 LAB — PROCALCITONIN: Procalcitonin: 2.16 ng/mL

## 2022-11-24 LAB — MAGNESIUM: Magnesium: 1.9 mg/dL (ref 1.7–2.4)

## 2022-11-24 MED ORDER — POTASSIUM CHLORIDE 20 MEQ PO PACK
40.0000 meq | PACK | Freq: Once | ORAL | Status: AC
  Administered 2022-11-24: 40 meq
  Filled 2022-11-24: qty 2

## 2022-11-24 MED ORDER — OXYCODONE HCL 5 MG PO TABS
5.0000 mg | ORAL_TABLET | Freq: Four times a day (QID) | ORAL | Status: DC
  Administered 2022-11-24 – 2022-12-01 (×29): 5 mg
  Filled 2022-11-24 (×30): qty 1

## 2022-11-24 MED ORDER — SPIRONOLACTONE 12.5 MG HALF TABLET
12.5000 mg | ORAL_TABLET | Freq: Every day | ORAL | Status: DC
  Administered 2022-11-24 – 2022-11-26 (×3): 12.5 mg via ORAL
  Filled 2022-11-24 (×5): qty 1

## 2022-11-24 MED ORDER — MAGNESIUM SULFATE 2 GM/50ML IV SOLN
2.0000 g | Freq: Once | INTRAVENOUS | Status: AC
  Administered 2022-11-24: 2 g via INTRAVENOUS
  Filled 2022-11-24: qty 50

## 2022-11-24 MED ORDER — SODIUM CHLORIDE 0.9 % IV SOLN
2.0000 g | Freq: Two times a day (BID) | INTRAVENOUS | Status: AC
  Administered 2022-11-24 – 2022-11-29 (×12): 2 g via INTRAVENOUS
  Filled 2022-11-24 (×13): qty 12.5

## 2022-11-24 NOTE — Progress Notes (Addendum)
Advanced Heart Failure Rounding Note  PCP-Cardiologist: Fransico Him, MD   Subjective:    Cath 3/14 c/w nonischemic cardiomyopathy, filling pressures minimally elevated on RHC. Excellent cardiac output by Fick. Extubated 3/15. Reintubated (3/16)  Failed SBT yesterday, plan for trach.   Comfortable in bed.   Objective:   Weight Range: 42.2 kg Body mass index is 16.48 kg/m.   Vital Signs:   Temp:  [98.3 F (36.8 C)-103.2 F (39.6 C)] 98.3 F (36.8 C) (03/22 0334) Pulse Rate:  [68-79] 70 (03/22 0600) Resp:  [21-25] 24 (03/22 0600) BP: (79-108)/(55-90) 101/65 (03/22 0500) SpO2:  [96 %-100 %] 98 % (03/22 0600) FiO2 (%):  [40 %] 40 % (03/22 0352) Weight:  [42.2 kg] 42.2 kg (03/22 0500) Last BM Date : 11/23/22  Weight change: Filed Weights   11/21/22 0500 11/22/22 0353 11/24/22 0500  Weight: 41.7 kg 40.3 kg 42.2 kg    Intake/Output:   Intake/Output Summary (Last 24 hours) at 11/24/2022 0710 Last data filed at 11/24/2022 0600 Gross per 24 hour  Intake 1594.9 ml  Output 1725 ml  Net -130.1 ml     CVP 7 Physical Exam  General: intubated HEENT: +cortrack  Neck: supple. No JVD. Carotids 2+ bilat; no bruits. No lymphadenopathy or thyromegaly appreciated. 3L CVC LIJ Cor: PMI nondisplaced. Regular rate & rhythm. No rubs, gallops or murmurs. Lungs: coarse Abdomen: soft, nontender, nondistended. No hepatosplenomegaly. No bruits or masses. Good bowel sounds. Extremities: no cyanosis, clubbing, rash, edema  Neuro: intubated, follows commands and nods appropriately  Telemetry  A paved 70s  No VT (Personally reviewed)   Labs    CBC Recent Labs    11/22/22 0323 11/23/22 0325  WBC 11.7* 14.4*  NEUTROABS 8.7*  --   HGB 8.6* 8.1*  HCT 26.5* 25.0*  MCV 99.3 99.2  PLT 259 [id number]   Basic Metabolic Panel Recent Labs    11/23/22 0325 11/24/22 0402  NA 142 137  K 3.9 3.6  CL 110 107  CO2 23 22  GLUCOSE 122* 90  BUN 13 12  CREATININE 0.70 0.54  CALCIUM 8.7*  8.2*  MG 2.0 1.9   Liver Function Tests No results for input(s): "AST", "ALT", "ALKPHOS", "BILITOT", "PROT", "ALBUMIN" in the last 72 hours.  No results for input(s): "LIPASE", "AMYLASE" in the last 72 hours. Cardiac Enzymes No results for input(s): "CKTOTAL", "CKMB", "CKMBINDEX", "TROPONINI" in the last 72 hours.  BNP: BNP (last 3 results) Recent Labs    11/11/22 1238 11/17/22 2150  BNP 58.5 145.2*    ProBNP (last 3 results) No results for input(s): "PROBNP" in the last 8760 hours.   D-Dimer No results for input(s): "DDIMER" in the last 72 hours. Hemoglobin A1C No results for input(s): "HGBA1C" in the last 72 hours.  Fasting Lipid Panel No results for input(s): "CHOL", "HDL", "LDLCALC", "TRIG", "CHOLHDL", "LDLDIRECT" in the last 72 hours. Thyroid Function Tests No results for input(s): "TSH", "T4TOTAL", "T3FREE", "THYROIDAB" in the last 72 hours.  Invalid input(s): "FREET3"  Other results:   Imaging    VAS [place] LOWER EXTREMITY VENOUS (DVT)  Result Date: 11/23/2022  Lower Venous DVT Study Patient Name:  KLYNN WEHMEYER  Date of Exam:   11/23/2022 Medical Rec #: [id number]          Accession #:    [id number] Date of Birth: 24-Oct-1970         Patient Gender: F Patient Age:   52 years Exam Location:  [facility] Procedure:  VAS [place] LOWER EXTREMITY VENOUS (DVT) Referring Phys: WHITNEY HARRIS --------------------------------------------------------------------------------  Indications: Respiratory failure.  Comparison Study: No previous study. Performing Technologist: McKayla Maag RVT, VT  Examination Guidelines: A complete evaluation includes B-mode imaging, spectral Doppler, color Doppler, and power Doppler as needed of all accessible portions of each vessel. Bilateral testing is considered an integral part of a complete examination. Limited examinations for reoccurring indications may be performed as noted. The reflux portion of the exam is performed with the patient  in reverse Trendelenburg.  +---------+---------------+---------+-----------+----------+--------------+ RIGHT    CompressibilityPhasicitySpontaneityPropertiesThrombus Aging +---------+---------------+---------+-----------+----------+--------------+ CFV      Full           Yes      Yes                                 +---------+---------------+---------+-----------+----------+--------------+ SFJ      Full                                                        +---------+---------------+---------+-----------+----------+--------------+ FV Prox  Full                                                        +---------+---------------+---------+-----------+----------+--------------+ FV Mid   Full                                                        +---------+---------------+---------+-----------+----------+--------------+ FV DistalFull                                                        +---------+---------------+---------+-----------+----------+--------------+ PFV      Full                                                        +---------+---------------+---------+-----------+----------+--------------+ POP      Full           Yes      Yes                                 +---------+---------------+---------+-----------+----------+--------------+ PTV      Full                                                        +---------+---------------+---------+-----------+----------+--------------+ PERO     Full                                                        +---------+---------------+---------+-----------+----------+--------------+   +---------+---------------+---------+-----------+----------+--------------+  LEFT     CompressibilityPhasicitySpontaneityPropertiesThrombus Aging +---------+---------------+---------+-----------+----------+--------------+ CFV      Full           Yes      Yes                                  +---------+---------------+---------+-----------+----------+--------------+ SFJ      Full                                                        +---------+---------------+---------+-----------+----------+--------------+ FV Prox  Full                                                        +---------+---------------+---------+-----------+----------+--------------+ FV Mid   Full                                                        +---------+---------------+---------+-----------+----------+--------------+ FV DistalFull                                                        +---------+---------------+---------+-----------+----------+--------------+ PFV      Full                                                        +---------+---------------+---------+-----------+----------+--------------+ POP      Full           Yes      Yes                                 +---------+---------------+---------+-----------+----------+--------------+ PTV      Full                                                        +---------+---------------+---------+-----------+----------+--------------+ PERO     Full                                                        +---------+---------------+---------+-----------+----------+--------------+     Summary: BILATERAL: - No evidence of deep vein thrombosis seen in the lower extremities, bilaterally. - No evidence of superficial venous thrombosis in the lower extremities, bilaterally. -No evidence of popliteal cyst, bilaterally.   *See table(s) above for measurements and observations. Electronically  signed by Jamelle Haring on 11/23/2022 at 3:47:30 PM.    Final      Medications:     Scheduled Medications:  bethanechol  10 mg Per Tube TID   Chlorhexidine Gluconate Cloth  6 each Topical Daily   enoxaparin (LOVENOX) injection  30 mg Subcutaneous Q24H   feeding supplement (PROSource TF20)  60 mL Per Tube Daily   fiber supplement  (BANATROL TF)  60 mL Per Tube BID   insulin aspart  0-9 Units Subcutaneous Q4H   levothyroxine  112 mcg Per Tube QAC breakfast   mouth rinse  15 mL Mouth Rinse Q2H   pantoprazole (PROTONIX) IV  40 mg Intravenous QHS   potassium chloride  40 mEq Per Tube Once   sodium chloride flush  3 mL Intravenous Q12H   sucralfate  1 g Per Tube BID    Infusions:  sodium chloride     ceFEPime (MAXIPIME) IV Stopped (11/23/22 2234)   dexmedetomidine (PRECEDEX) IV infusion 0.8 mcg/kg/hr (11/24/22 0600)   feeding supplement (VITAL 1.5 CAL) 45 mL/hr at 11/24/22 0600   fentaNYL infusion INTRAVENOUS 50 mcg/hr (11/24/22 0600)   magnesium sulfate bolus IVPB      PRN Medications: sodium chloride, acetaminophen, docusate, fentaNYL, guaiFENesin, ipratropium-albuterol, melatonin, midazolam, mouth rinse, mouth rinse, mouth rinse, polyethylene glycol, sodium chloride flush  Patient Profile   52 y.o. female with a hx of tobacco use, COPD, myotonic dystrophy type I since age 75, high grade heart block s/p PPM, RBBB, sickle cell trait, and DM1 (questionable diagnosis) and strong family history of heart failure (father s/p cardiac transplant and paternal aunt w/ NICM w/ LVAD), admitted w/ acute hypoxic respiratory failure/ LLL CAP secondary to metapneumovirus. Hospital course c/b VF arrest. Post arrest echo w/ severe BiV failure, LVEF < 20%, RV severely reduced + diffuse HK   Assessment/Plan  1. VF arrest: 3/12, shocked. 15 minutes CPR total.  - Due to long-short interval (R-on-T) in the setting of QT-prolonging drugs (azithro) and hypokalemia - Pacemaker lower rate limit has been increased to prevent pause-dependent TdP; this should be reprogrammed to a more physiologic rate if/when approaching discharge. - Per EP: may require upgrade with LV lead +/-upgrade to primary prevention ICD if EF does not recover with GDMT. VT this admission was attributable to a reversible cause, so ICD not indicated urgently for secondary  prevention. Can consider placement of LifeVest on discharge.  - No arrhythmias.  - Keep K> 4.0 Mg > 2.0 - avoid QT prolonging agents  2. Acute hypoxemic respiratory failure: Intubated post-VF arrest.  Had viral PNA with metapneumovirus at presentation.  - extubated 3/14 Reintubated 3/16 - Plan for trach, family discussion ongoing.  - CCM managing.   3. Acute systolic CHF: Prior workup for cardiac sarcoidosis. Cardiac MRI in 2017 showed subendocardial LGE in the mid inferior and inferolateral walls with EF 56%.  Cardiac PET done after this was NOT suggestive of cardiac sarcoidosis.  - Echo in 5/22 with normal EF.   - Echo this admission reviewed, EF 20-25% range with diffuse hypokinesis but relatively preserved function at the apex, moderate RV dysfunction.  - cath 3/15 no CAD - suspect due to myotonic dystrophy type 1 -> she has an aunt with LVAD who has cardiomyopathy due to myotonic dystrophy.  Father had a heart transplant, uncertain of underlying condition. R/LHC c/w Nonischemic cardiomyopathy.  - CVP 7. Does not need diuretics.  - start spiro 12.5 mg daily - Off pressors. No room for GDMT.  -  Given overall debility, she is not currently a candidate for advanced therapies - Would consider palliative care input    4. High grade heart block: Has MDT PPM with His bundle lead.  This is likely due to cardiac involvement of myotonic dystrophy type 1.  - plan as above  5. Myotonic dystrophy: Type 1, diagnosed at age 50. Has family members with the same diagnosis.  Followed by neurology.   6. Tobacco abuse - needs cessation  7. Hypokalemia/hypomag - supp K  - Mag ok.   Length of Stay: [place], NP  11/24/2022, 7:10 AM  Advanced Heart Failure Team Pager [phone] (M-F; 7a - 5p)  Please contact [facility] for night-coverage after hours (5p -7a ) and weekends on [email or web address]  Agree with above.   Failed SBT yesterday. Planning for trach today. Rhythm stable   General:   Weak appearing. On vent HEENT: normal + ETT Neck: supple. no JVD. Carotids 2+ bilat; no bruits. No lymphadenopathy or thryomegaly appreciated. Cor: PMI nondisplaced. Regular rate & rhythm. No rubs, gallops or murmurs. Lungs: clear Abdomen: soft, nontender, nondistended. No hepatosplenomegaly. No bruits or masses. Good bowel sounds. Extremities: no cyanosis, clubbing, rash, edema Neuro: alert & orientedx3, cranial nerves grossly intact. moves all 4 extremities w/o difficulty. Affect pleasant  Agree with plan for trachesotomy. Agree with adding spiro. Avoiding dig with recent brady mediated torsades,   HF team will see again Monday unless called over the weekend.   CRITICAL CARE Performed by: Glori Bickers  Total critical care time: 35 minutes  Critical care time was exclusive of separately billable procedures and treating other patients.  Critical care was necessary to treat or prevent imminent or life-threatening deterioration.  Critical care was time spent personally by me (independent of midlevel providers or residents) on the following activities: development of treatment plan with patient and/or surrogate as well as nursing, discussions with consultants, evaluation of patient's response to treatment, examination of patient, obtaining history from patient or surrogate, ordering and performing treatments and interventions, ordering and review of laboratory studies, ordering and review of radiographic studies, pulse oximetry and re-evaluation of patient's condition.  Glori Bickers, MD  9:31 AM

## 2022-11-24 NOTE — Progress Notes (Signed)
Pharmacy Electrolyte Replacement  Recent Labs:  Recent Labs    11/24/22 0402  K 3.6  MG 1.9  CREATININE 0.54    Low Critical Values (K </= 2.5, Phos </= 1, Mg </= 1) Present: None  MD Contacted: Dr. Silas Flood  Plan: potassium chloride 40 MEq per tube x 1 magnesium sulfate 2g IV x1   Eliseo Gum, PharmD PGY1 Pharmacy Resident   11/24/2022  6:36 AM

## 2022-11-24 NOTE — Progress Notes (Signed)
NAME:  Cynthia Underwood, MRN:  [id number], DOB:  May 23, 1971, LOS: 85 ADMISSION DATE:  11/11/2022 CONSULTATION DATE:  11/14/2022 REFERRING MD:  Wyline Copas - TR[facility] CHIEF COMPLAINT:  Vfib arrest on floor   History of Present Illness:   52 year old woman who initially presented to [facility] 3/9 for cough and progressive dyspnea x 4 days. Also reported diarrhea and 1 episode of epigastric pain with vomiting. PMHx significant for RBBB/high-grade heart block (s/p PPM), hypothyroidism, sickle cell trait, myotonic dystrophy (dx age 59), tobacco use.  Noted to be hypoxic in ED with O2 sat 88% on RA, CTA Chest c/f LLL PNA with mild bronchiectasis, negative for PE. Also c/f esophagitis. Patient was empirically started on CAP coverage (ceftriaxone, azithromycin) and GERD treatment (PPI/sucralfate). Also found to be positive for human metapneumovirus. Admitted to [facility].  Patient was initially progressing as expected 3/11-3/12 until ~1400, sat was noted to be in 80s. Placed on 5L humidified oxygen with slight improvement in sats. Solumedrol and DuoNebs were ordered. Around 1900 patient's RN was informed by telemetry of VF and was found unresponsive and pulseless. Code Blue was called with CPR x 15 minutes and ROSC. Amiodarone, Epi and defib were administered. Patient was intubated by anesthesia peri-code. IO was placed and Levophed was initiated. Cardiology was consulted.  PCCM was consulted for ICU admission post-arrest.  Pertinent Medical History:   Past Medical History:  Diagnosis Date   Bifascicular block 10/02/2014   Cataract    Dyspnea    Excess ear wax    Multinodular goiter    Pneumonia    Presence of permanent cardiac pacemaker    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   Sickle cell trait (HCC)    Watery eyes    Left   Weakness of both legs    Significant Hospital Events: Including procedures, antibiotic start and stop dates in addition to other pertinent events    3/9 - Presented to [facility] for LLL PNA. Started on Surveyor, quantity. +human metapneumovirus. 3/12 - Hypoxic requiring 5LNC. Later VF arrest on floor, CPR 3/13 echocardiogram LVEF severely depressed 50% (decreased compared with 01/2021), severely reduced RV function 3/14 Cardiac catheterization showed normal coronaries, new normal filling pressures and normal PA pressure.  With excellent cardiac output 3/15 Extubated.  3/16 Reintubated for increased work of breathing, failed BiPAP 3/18 alert, interactive off vasopressors 3/20 Wean, trial extubation today  3/21 No acute issues overnight, failed early am wean due to apnea on 50 Fentanyl and 0.8 Pecedex   Interim History / Subjective:  Wakes to verbal stimuli Remains weak and deconditioned    Objective:  Blood pressure 99/62, pulse 69, temperature 99.2 F (37.3 C), temperature source Oral, resp. rate (!) 24, height 5\' 3"  (1.6 m), weight 42.2 kg, last menstrual period 12/03/2016, SpO2 99 %. CVP:  [11 mmHg-13 mmHg] 11 mmHg  Vent Mode: PRVC FiO2 (%):  [40 %] 40 % Set Rate:  [24 bmp] 24 bmp Vt Set:  [410 mL] 410 mL PEEP:  [5 cmH20] 5 cmH20 Plateau Pressure:  [16 cmH20-20 cmH20] 20 cmH20   Intake/Output Summary (Last 24 hours) at 11/24/2022 0812 Last data filed at 11/24/2022 0732 Gross per 24 hour  Intake 1552.39 ml  Output 1725 ml  Net -172.61 ml    Filed Weights   11/21/22 0500 11/22/22 0353 11/24/22 0500  Weight: 41.7 kg 40.3 kg 42.2 kg   Physical Examination: General: Acute on chronically ill appearing middle female lying in bed in NAD HEENT:  ETT, MM pink/moist, PERRL,  Neuro: Wakes to verbal stimuli, no increased work of breathing on vent, apnea this am on SBT CV: s1s2 regular rate and rhythm, no murmur, rubs, or gallops,  PULM:  Clear to auscultation, no increased work of breathing, no added breath sounds  GI: soft, bowel sounds active in all 4 quadrants, non-tender, non-distended, tolerating TF Extremities: warm/dry, no edema   Skin: no rashes or lesions  Resolved Hospital Problem List:    Assessment & Plan:   Multifactorial shock, septic (pneumonia as source) vs. cardiogenic post-arrest.  Suspect in part due to need for sedation. --off vasopressors -- minimize sedatives   Post-Vfib arrest Troponinemia History of RBBB and PPM implantation New cardiomyopathy, LVEF 20% --Cardiac cath shows nonischemic cardiomyopathy with clean coronaries, normal filling pressures and no evidence of pulmonary hypertension --Cardiology and EP are following   Acute hypoxemic respiratory failure LLL PNA +Human metapneumovirus infection --Reintubated 3/16 after failing BiPAP, likely due to neuromuscular weakness in the setting of myotonic dystrophy worsened in the setting of myopathy of critical illness -- Not able to pass SBT, plan move forward with tracheostomy   Acute metabolic encephalopathy --Multifactorial in the setting of post-arrest, metabolic derangements/acidosis, hypoxia, sedation. --Now improved --Start low dose oxy per tube 3/22 to minimize withdrawal with fentanyl wean   Shock liver, Resolved -- likely in the setting of VF arrest with transaminitis post-code.   Hyperglycemia --Sliding scale insulin    GERD with esophagitis -- Continue PPI   Hypothyroidism -- Continue levothyroxine   Constipation -- hold Bowel regimen now with diarrhea   Best Practice: (right click and "Reselect all SmartList Selections" daily)   Diet/type: NPO w/ meds via tube DVT prophylaxis: Lovenox GI prophylaxis: PPI Lines: Central line and Arterial Line Foley:  N/A Code Status:  full code Last date of multidisciplinary goals of care discussion [See IPAL 3/21]  Critical care time:    CRITICAL CARE Performed by: Lanier Clam    Total critical care time: 31 minutes  Critical care time was exclusive of separately billable procedures and treating other patients.  Critical care was necessary to treat or  prevent imminent or life-threatening deterioration.  Critical care was time spent personally by me on the following activities: development of treatment plan with patient and/or surrogate as well as nursing, discussions with consultants, evaluation of patient's response to treatment, examination of patient, obtaining history from patient or surrogate, ordering and performing treatments and interventions, ordering and review of laboratory studies, ordering and review of radiographic studies, pulse oximetry and re-evaluation of patient's condition.  Lanier Clam, MD [facility] Personal contact information can be found on Amion  If no contact or response made please call [phone] 11/24/2022, 8:12 AM

## 2022-11-24 NOTE — Progress Notes (Signed)
Patient remains intubated and resting on vent settings of PRVC 410/F24/+5/40. Pt is not showing any signs of distress at this time. No vent changes have been made. All vent checks have been completed and ETT remains at 23cm @ lip and has been rotated to prevent any sores on lip. Family member at bedside. Pt will continue to be monitored.

## 2022-11-25 DIAGNOSIS — J189 Pneumonia, unspecified organism: Secondary | ICD-10-CM | POA: Diagnosis not present

## 2022-11-25 DIAGNOSIS — J123 Human metapneumovirus pneumonia: Secondary | ICD-10-CM

## 2022-11-25 DIAGNOSIS — I469 Cardiac arrest, cause unspecified: Secondary | ICD-10-CM | POA: Diagnosis not present

## 2022-11-25 DIAGNOSIS — I5021 Acute systolic (congestive) heart failure: Secondary | ICD-10-CM | POA: Diagnosis not present

## 2022-11-25 LAB — BASIC METABOLIC PANEL
Anion gap: 10 (ref 5–15)
BUN: 15 mg/dL (ref 6–20)
CO2: 20 mmol/L — ABNORMAL LOW (ref 22–32)
Calcium: 8.3 mg/dL — ABNORMAL LOW (ref 8.9–10.3)
Chloride: 103 mmol/L (ref 98–111)
Creatinine, Ser: 0.59 mg/dL (ref 0.44–1.00)
GFR, Estimated: >60 mL/min (ref >60)
Glucose, Bld: 142 mg/dL — ABNORMAL HIGH (ref 70–99)
Potassium: 4.4 mmol/L (ref 3.5–5.1)
Sodium: 133 mmol/L — ABNORMAL LOW (ref 135–145)

## 2022-11-25 LAB — GLUCOSE, CAPILLARY
Glucose-Capillary: 150 mg/dL — ABNORMAL HIGH (ref 70–99)
Glucose-Capillary: 127 mg/dL — ABNORMAL HIGH (ref 70–99)
Glucose-Capillary: 131 mg/dL — ABNORMAL HIGH (ref 70–99)
Glucose-Capillary: 128 mg/dL — ABNORMAL HIGH (ref 70–99)
Glucose-Capillary: 118 mg/dL — ABNORMAL HIGH (ref 70–99)
Glucose-Capillary: 114 mg/dL — ABNORMAL HIGH (ref 70–99)

## 2022-11-25 LAB — RETICULOCYTES
Immature Retic Fract: 26.8 % — ABNORMAL HIGH (ref 2.3–15.9)
RBC.: 2.23 MIL/uL — ABNORMAL LOW (ref 3.87–5.11)
Retic Count, Absolute: 36.8 10*3/uL (ref 19.0–186.0)
Retic Ct Pct: 1.7 % (ref 0.4–3.1)

## 2022-11-25 LAB — CULTURE, RESPIRATORY W GRAM STAIN: Culture: NORMAL

## 2022-11-25 LAB — CBC
HCT: 22.1 % — ABNORMAL LOW (ref 36.0–46.0)
Hemoglobin: 7 g/dL — ABNORMAL LOW (ref 12.0–15.0)
MCH: 31.3 pg (ref 26.0–34.0)
MCHC: 31.7 g/dL (ref 30.0–36.0)
MCV: 98.7 fL (ref 80.0–100.0)
Platelets: 310 10*3/uL (ref 150–400)
RBC: 2.24 MIL/uL — ABNORMAL LOW (ref 3.87–5.11)
RDW: 16.4 % — ABNORMAL HIGH (ref 11.5–15.5)
WBC: 9.4 10*3/uL (ref 4.0–10.5)
nRBC: 0.5 % — ABNORMAL HIGH (ref 0.0–0.2)

## 2022-11-25 LAB — ABO/RH: ABO/RH(D): B POS

## 2022-11-25 LAB — FERRITIN: Ferritin: 143 ng/mL (ref 11–307)

## 2022-11-25 LAB — IRON AND TIBC
Iron: 19 ug/dL — ABNORMAL LOW (ref 28–170)
Saturation Ratios: 7 % — ABNORMAL LOW (ref 10.4–31.8)
TIBC: 279 ug/dL (ref 250–450)
UIBC: 260 ug/dL

## 2022-11-25 LAB — HEMOGLOBIN AND HEMATOCRIT, BLOOD
HCT: 21.2 % — ABNORMAL LOW (ref 36.0–46.0)
Hemoglobin: 6.9 g/dL — CL (ref 12.0–15.0)

## 2022-11-25 LAB — PREPARE RBC (CROSSMATCH)

## 2022-11-25 LAB — PHOSPHORUS: Phosphorus: 2.2 mg/dL — ABNORMAL LOW (ref 2.5–4.6)

## 2022-11-25 LAB — MAGNESIUM: Magnesium: 2.3 mg/dL (ref 1.7–2.4)

## 2022-11-25 MED ORDER — ORAL CARE MOUTH RINSE: 15.0000 mL | OROMUCOSAL | Status: DC | PRN

## 2022-11-25 MED ORDER — ORAL CARE MOUTH RINSE
15.0000 mL | OROMUCOSAL | Status: DC
  Administered 2022-11-25 – 2022-12-06 (×94): 15 mL via OROMUCOSAL

## 2022-11-25 MED ORDER — SODIUM CHLORIDE 0.9 % IV SOLN: 250.0000 mL | INTRAVENOUS | Status: DC | PRN

## 2022-11-25 MED ORDER — ORAL CARE MOUTH RINSE
15.0000 mL | OROMUCOSAL | Status: DC
  Administered 2022-11-25 (×3): 15 mL via OROMUCOSAL

## 2022-11-25 MED ORDER — SODIUM CHLORIDE 0.9% FLUSH: 3.0000 mL | Freq: Two times a day (BID) | INTRAVENOUS | Status: DC

## 2022-11-25 MED ORDER — LIDOCAINE VISCOUS HCL 2 % MT SOLN
15.0000 mL | Freq: Four times a day (QID) | OROMUCOSAL | Status: DC | PRN
  Administered 2022-11-25 – 2022-11-26 (×2): 15 mL via OROMUCOSAL
  Filled 2022-11-25 (×3): qty 15

## 2022-11-25 MED ORDER — SODIUM CHLORIDE 0.9% FLUSH: 3.0000 mL | INTRAVENOUS | Status: DC | PRN

## 2022-11-25 MED ORDER — ORAL CARE MOUTH RINSE
15.0000 mL | OROMUCOSAL | Status: DC
  Administered 2022-11-25: 15 mL via OROMUCOSAL

## 2022-11-25 MED ORDER — LIP MEDEX EX OINT
TOPICAL_OINTMENT | CUTANEOUS | Status: DC | PRN
  Administered 2022-11-25 – 2022-12-03 (×2): 75 via TOPICAL
  Filled 2022-11-25: qty 7

## 2022-11-25 MED ORDER — ORAL CARE MOUTH RINSE
15.0000 mL | OROMUCOSAL | Status: DC
  Administered 2022-11-25 (×2): 15 mL via OROMUCOSAL

## 2022-11-25 MED ORDER — SODIUM CHLORIDE 0.9% IV SOLUTION: Freq: Once | INTRAVENOUS | Status: AC

## 2022-11-25 MED ORDER — SODIUM PHOSPHATES 45 MMOLE/15ML IV SOLN
15.0000 mmol | Freq: Once | INTRAVENOUS | Status: AC
  Administered 2022-11-25: 15 mmol via INTRAVENOUS
  Filled 2022-11-25: qty 5

## 2022-11-25 MED ORDER — DIPHENHYDRAMINE HCL 50 MG/ML IJ SOLN
25.0000 mg | Freq: Four times a day (QID) | INTRAMUSCULAR | Status: DC | PRN
  Administered 2022-11-25 – 2022-12-01 (×5): 25 mg via INTRAVENOUS
  Filled 2022-11-25 (×5): qty 1

## 2022-11-25 NOTE — Progress Notes (Signed)
NAME:  Cynthia Underwood, MRN:  [id number], DOB:  01-08-1971, LOS: 73 ADMISSION DATE:  11/11/2022 CONSULTATION DATE:  11/14/2022 REFERRING MD:  Wyline Copas - [facility] CHIEF COMPLAINT:  Vfib arrest on floor   History of Present Illness:   52 year old woman who initially presented to [facility] 3/9 for cough and progressive dyspnea x 4 days. Also reported diarrhea and 1 episode of epigastric pain with vomiting. PMHx significant for RBBB/high-grade heart block (s/p PPM), hypothyroidism, sickle cell trait, myotonic dystrophy (dx age 27), tobacco use.  Noted to be hypoxic in ED with O2 sat 88% on RA, CTA Chest c/f LLL PNA with mild bronchiectasis, negative for PE. Also c/f esophagitis. Patient was empirically started on CAP coverage (ceftriaxone, azithromycin) and GERD treatment (PPI/sucralfate). Also found to be positive for human metapneumovirus. Admitted to [facility].  Patient was initially progressing as expected 3/11-3/12 until ~1400, sat was noted to be in 80s. Placed on 5L humidified oxygen with slight improvement in sats. Solumedrol and DuoNebs were ordered. Around 1900 patient's RN was informed by telemetry of VF and was found unresponsive and pulseless. Code Blue was called with CPR x 15 minutes and ROSC. Amiodarone, Epi and defib were administered. Patient was intubated by anesthesia peri-code. IO was placed and Levophed was initiated. Cardiology was consulted.  PCCM was consulted for ICU admission post-arrest.  Pertinent Medical History:  Heart block, Goiter, Sickle cell trait, Myotonic dystrophy  Significant Hospital Events: Including procedures, antibiotic start and stop dates in addition to other pertinent events   3/9 - Presented to [facility] for LLL PNA. Started on Surveyor, quantity. +human metapneumovirus. 3/12 - Hypoxic requiring 5LNC. Later VF arrest on floor, CPR 3/13 echocardiogram LVEF severely depressed 50% (decreased compared with 01/2021), severely reduced RV function 3/14 Cardiac catheterization showed  normal coronaries, new normal filling pressures and normal PA pressure.  With excellent cardiac output 3/15 Extubated.  3/16 Reintubated for increased work of breathing, failed BiPAP 3/18 alert, interactive off vasopressors 3/20 Wean, trial extubation today  3/21 No acute issues overnight, failed early am wean due to apnea on 50 Fentanyl and 0.8 Pecedex   Interim History / Subjective:  Remains on sedation.  Objective:  Blood pressure 96/82, pulse 89, temperature 98.9 F (37.2 C), temperature source Oral, resp. rate 20, height 5\' 3"  (1.6 m), weight 41.9 kg, last menstrual period 12/03/2016, SpO2 95 %. CVP:  [5 mmHg-9 mmHg] 5 mmHg  Vent Mode: PRVC FiO2 (%):  [40 %] 40 % Set Rate:  [24 bmp] 24 bmp Vt Set:  [410 mL] 410 mL PEEP:  [5 cmH20] 5 cmH20 Plateau Pressure:  [19 cmH20-22 cmH20] 22 cmH20   Intake/Output Summary (Last 24 hours) at 11/25/2022 [id number] Last data filed at 11/25/2022 [id number] Gross per 24 hour  Intake 2395.46 ml  Output 1500 ml  Net 895.46 ml   Filed Weights   11/22/22 0353 11/24/22 0500 11/25/22 0154  Weight: 40.3 kg 42.2 kg 41.9 kg   Physical Examination:  General - sedated Eyes - pupils reactive ENT - ETT in place Cardiac - regular rate/rhythm, no murmur Chest - equal breath sounds b/l, no wheezing or rales Abdomen - soft, non tender, + bowel sounds Extremities - decreased muscle bulk Skin - no rashes Neuro - follows simple commands  Resolved Hospital Problem List:  Septic shock from pneumonia, Cardiogenic shock from cardiac arrest, Shock liver in setting of cardiac arrest  Assessment & Plan:   Post-Vfib arrest. Elevated troponin. Heart block s/p MDT PPM. New acute non ischemic systolic CHF  with EF 20%. - CHF team and EP cardiology following  Acute hypoxic respiratory failure from cardiac arrest, CHF, Metapneumovirus pneumonia, HCAP, muscle weakness from myotonic dystrophy. - continue full vent support for now - goal SpO2 > 92% - f/u CXR  intermittently - plan for tracheostomy week of 3/25 - day 2 of cefepime, f/u blood culture from [id number]   Acute metabolic encephalopathy from anoxia, sepsis. - RASS goal 0 to -1   Hyperglycemia. - SSI   GERD with esophagitis. -- Continue PPI   Hypothyroidism. - Continue levothyroxine   Constipation. -- hold Bowel regimen now with diarrhea  Anemia of critical illness and chronic disease. - f/u CBC - transfuse for Hb < 7 - check iron levels  Hypophosphatemia. - replace as needed  Best Practice: (right click and "Reselect all SmartList Selections" daily)   Diet/type: tubefeeds DVT prophylaxis: Lovenox GI prophylaxis: PPI Lines: Central line Foley:  N/A Code Status:  full code Last date of multidisciplinary goals of care discussion [updated her sister at bedside]  Labs:      Latest Ref Rng & Units 11/25/2022    3:52 AM 11/24/2022    4:02 AM 11/23/2022    3:25 AM  CMP  Glucose 70 - 99 mg/dL 142  90  122   BUN 6 - 20 mg/dL 15  12  13    Creatinine 0.44 - 1.00 mg/dL 0.59  0.54  0.70   Sodium 135 - 145 mmol/L 133  137  142   Potassium 3.5 - 5.1 mmol/L 4.4  3.6  3.9   Chloride 98 - 111 mmol/L 103  107  110   CO2 22 - 32 mmol/L 20  22  23    Calcium 8.9 - 10.3 mg/dL 8.3  8.2  8.7        Latest Ref Rng & Units 11/25/2022    3:52 AM 11/23/2022    3:25 AM 11/22/2022    3:23 AM  CBC  WBC 4.0 - 10.5 K/uL 9.4  14.4  11.7   Hemoglobin 12.0 - 15.0 g/dL 7.0  8.1  8.6   Hematocrit 36.0 - 46.0 % 22.1  25.0  26.5   Platelets 150 - 400 K/uL 310  256  259     ABG    Component Value Date/Time   PHART 7.48 (H) 11/18/2022 2306   PCO2ART 36 11/18/2022 2306   PO2ART 137 (H) 11/18/2022 2306   HCO3 26.9 11/18/2022 2306   TCO2 28 11/18/2022 1931   ACIDBASEDEF 2.0 11/18/2022 0517   O2SAT 75.9 11/23/2022 0325    CBG (last 3)  Recent Labs    11/24/22 2319 11/25/22 0356 11/25/22 0803  GLUCAP 129* 150* 127*    Critical care time 32 minutes  Chesley Mires, MD [place] Pager - [phone] or [phone] 11/25/2022, 9:28 AM

## 2022-11-25 NOTE — Progress Notes (Addendum)
Date and time results received: 11/25/22 2010 (use smartphrase ".now" to insert current time)  Test: HgB Critical Value: 6.9  Name of Provider Notified: Elink  Orders Received? Or Actions Taken?:  Awaiting new orders New orders placed at 2027. There is already a blood consent in chart and Dr. James Ivanoff discussed blood transfusion with patient and patients mom. 1 unit of PRBC's ordered. Will collect Type & Screen. Continuing to monitor patient.

## 2022-11-25 NOTE — Progress Notes (Signed)
eLink Physician-Brief Progress Note Patient Name: Cynthia Underwood DOB: 30-Apr-1971 MRN: [id number]   Date of Service  11/25/2022  HPI/Events of Note  Pt complaining of itching.  Blood has not been started.   eICU Interventions  Benadryl ordered PRN.      Intervention Category Intermediate Interventions: Other:  [name] 11/25/2022, 10:15 PM

## 2022-11-25 NOTE — Progress Notes (Signed)
Advanced Heart Failure Rounding Note  PCP-Cardiologist: Fransico Him, MD   Subjective:    Cath 3/14 c/w nonischemic cardiomyopathy, filling pressures minimally elevated on RHC. Excellent cardiac output by Fick. Extubated 3/15. Reintubated (3/16)   Failed SBT again this week. Awake on vent following commands. SBP 90s. Plan for trach on Monday .   Objective:   Weight Range: 41.9 kg Body mass index is 16.36 kg/m.   Vital Signs:   Temp:  [98.1 F (36.7 C)-99 F (37.2 C)] 98.9 F (37.2 C) (03/23 0805) Pulse Rate:  [66-114] 69 (03/23 1100) Resp:  [16-24] 24 (03/23 1100) BP: (80-112)/(44-82) 94/62 (03/23 1100) SpO2:  [91 %-100 %] 96 % (03/23 1100) FiO2 (%):  [40 %] 40 % (03/23 0802) Weight:  [41.9 kg] 41.9 kg (03/23 0154) Last BM Date : 11/25/22  Weight change: Filed Weights   11/22/22 0353 11/24/22 0500 11/25/22 0154  Weight: 40.3 kg 42.2 kg 41.9 kg    Intake/Output:   Intake/Output Summary (Last 24 hours) at 11/25/2022 1127 Last data filed at 11/25/2022 1100 Gross per 24 hour  Intake 2215.45 ml  Output 1400 ml  Net 815.45 ml       Physical Exam  General: intubated awake following commands. Cahcetic HEENT: +cortrak + ETT Neck: supple. no JVD. Carotids 2+ bilat; no bruits. No lymphadenopathy or thryomegaly appreciated. Cor: PMI nondisplaced. Regular rate & rhythm. No rubs, gallops or murmurs. Lungs: clear Abdomen: soft, nontender, nondistended. No hepatosplenomegaly. No bruits or masses. Good bowel sounds. Extremities: no cyanosis, clubbing, rash, edema Neuro: awake following commands moves al4    Telemetry   A paced 70 No VT (Personally reviewed)    Labs    CBC Recent Labs    11/23/22 0325 11/25/22 0352  WBC 14.4* 9.4  HGB 8.1* 7.0*  HCT 25.0* 22.1*  MCV 99.2 98.7  PLT 256 [id number]    Basic Metabolic Panel Recent Labs    11/24/22 0402 11/25/22 0352  NA 137 133*  K 3.6 4.4  CL 107 103  CO2 22 20*  GLUCOSE 90 142*  BUN 12 15  CREATININE  0.54 0.59  CALCIUM 8.2* 8.3*  MG 1.9 2.3  PHOS  --  2.2*    Liver Function Tests No results for input(s): "AST", "ALT", "ALKPHOS", "BILITOT", "PROT", "ALBUMIN" in the last 72 hours.  No results for input(s): "LIPASE", "AMYLASE" in the last 72 hours. Cardiac Enzymes No results for input(s): "CKTOTAL", "CKMB", "CKMBINDEX", "TROPONINI" in the last 72 hours.  BNP: BNP (last 3 results) Recent Labs    11/11/22 1238 11/17/22 2150  BNP 58.5 145.2*     ProBNP (last 3 results) No results for input(s): "PROBNP" in the last 8760 hours.   D-Dimer No results for input(s): "DDIMER" in the last 72 hours. Hemoglobin A1C No results for input(s): "HGBA1C" in the last 72 hours.  Fasting Lipid Panel No results for input(s): "CHOL", "HDL", "LDLCALC", "TRIG", "CHOLHDL", "LDLDIRECT" in the last 72 hours. Thyroid Function Tests No results for input(s): "TSH", "T4TOTAL", "T3FREE", "THYROIDAB" in the last 72 hours.  Invalid input(s): "FREET3"  Other results:   Imaging    No results found.   Medications:     Scheduled Medications:  bethanechol  10 mg Per Tube TID   Chlorhexidine Gluconate Cloth  6 each Topical Daily   enoxaparin (LOVENOX) injection  30 mg Subcutaneous Q24H   feeding supplement (PROSource TF20)  60 mL Per Tube Daily   fiber supplement (BANATROL TF)  60 mL Per  Tube BID   insulin aspart  0-9 Units Subcutaneous Q4H   levothyroxine  112 mcg Per Tube QAC breakfast   mouth rinse  15 mL Mouth Rinse Q2H   mouth rinse  15 mL Mouth Rinse Q2H   oxyCODONE  5 mg Per Tube Q6H   pantoprazole (PROTONIX) IV  40 mg Intravenous QHS   sodium chloride flush  3 mL Intravenous Q12H   spironolactone  12.5 mg Oral Daily   sucralfate  1 g Per Tube BID    Infusions:  sodium chloride Stopped (11/25/22 1027)   ceFEPime (MAXIPIME) IV 200 mL/hr at 11/25/22 1100   dexmedetomidine (PRECEDEX) IV infusion 0.9 mcg/kg/hr (11/25/22 1100)   feeding supplement (VITAL 1.5 CAL) 45 mL/hr at 11/25/22  1100   fentaNYL infusion INTRAVENOUS 50 mcg/hr (11/25/22 1100)   sodium phosphate 15 mmol in dextrose 5 % 250 mL infusion 43 mL/hr at 11/25/22 1100    PRN Medications: sodium chloride, acetaminophen, docusate, fentaNYL, guaiFENesin, ipratropium-albuterol, melatonin, midazolam, mouth rinse, polyethylene glycol, sodium chloride flush  Patient Profile   52 y.o. female with a hx of tobacco use, COPD, myotonic dystrophy type I since age 74, high grade heart block s/p PPM, RBBB, sickle cell trait, and DM1 (questionable diagnosis) and strong family history of heart failure (father s/p cardiac transplant and paternal aunt w/ NICM w/ LVAD), admitted w/ acute hypoxic respiratory failure/ LLL CAP secondary to metapneumovirus. Hospital course c/b VF arrest. Post arrest echo w/ severe BiV failure, LVEF < 20%, RV severely reduced + diffuse HK   Assessment/Plan  1. VF arrest: 3/12, shocked. 15 minutes CPR total.  - Due to long-short interval (R-on-T) in the setting of QT-prolonging drugs (azithro) and hypokalemia - Pacemaker lower rate limit increased to 90 to prevent pause-dependent TdP; now down to 70 - Per EP: may require upgrade with LV lead +/-upgrade to primary prevention ICD if EF does not recover with GDMT. VT this admission was attributable to a reversible cause, so ICD not indicated urgently for secondary prevention. Can consider placement of LifeVest on discharge. Continue to follow  - No arrhythmias on tele - Keep K> 4.0 Mg > 2.0 - avoid QT prolonging agents  2. Acute hypoxemic respiratory failure: Intubated post-VF arrest.  Had viral PNA with metapneumovirus at presentation.  - extubated 3/14 Reintubated 3/16 - Failed multiple SBTs - Plan for trach Monday  - CCM managing.   3. Acute systolic CHF: Prior workup for cardiac sarcoidosis. Cardiac MRI in 2017 showed subendocardial LGE in the mid inferior and inferolateral walls with EF 56%.  Cardiac PET done after this was NOT suggestive of  cardiac sarcoidosis.  - Echo in 5/22 with normal EF.   - Echo this admission reviewed, EF 20-25% range with diffuse hypokinesis but relatively preserved function at the apex, moderate RV dysfunction.  - cath 3/15 no CAD - suspect due to myotonic dystrophy type 1 -> she has an aunt with LVAD who has cardiomyopathy due to myotonic dystrophy.  Father had a heart transplant, uncertain of underlying condition. R/LHC c/w Nonischemic cardiomyopathy.  - CVP ok. Off lasix - Continue spiro 12.5 mg daily - Off pressors. No room for GDMT with low BP - Given overall debility, she is not currently a candidate for advanced therapies - Would consider palliative care input    4. High grade heart block: Has MDT PPM with His bundle lead.  This is likely due to cardiac involvement of myotonic dystrophy type 1.  - plan as above  5. Myotonic dystrophy: Type 1, diagnosed at age 6. Has family members with the same diagnosis.  Followed by neurology.   6. Tobacco abuse - needs cessation  7. Hypokalemia/hypomag - supp as needed  8. Anemia of chronic illness - hgb 7.0 - consider transfusing to keep Hgb >= 7.5  CRITICAL CARE Performed by: Glori Bickers  Total critical care time: 35 minutes  Critical care time was exclusive of separately billable procedures and treating other patients.  Critical care was necessary to treat or prevent imminent or life-threatening deterioration.  Critical care was time spent personally by me (independent of midlevel providers or residents) on the following activities: development of treatment plan with patient and/or surrogate as well as nursing, discussions with consultants, evaluation of patient's response to treatment, examination of patient, obtaining history from patient or surrogate, ordering and performing treatments and interventions, ordering and review of laboratory studies, ordering and review of radiographic studies, pulse oximetry and re-evaluation of patient's  condition.   Length of Stay: 45  Glori Bickers, MD  11/25/2022, 11:27 AM  Advanced Heart Failure Team Pager [phone] (M-F; 7a - 5p)  Please contact [facility] for night-coverage after hours (5p -7a ) and weekends on [email or web address]

## 2022-11-25 NOTE — Progress Notes (Signed)
[facility] Progress Note Patient Name: Cynthia Underwood DOB: 1971-02-24 MRN: [id number]   Date of Service  11/25/2022  HPI/Events of Note  Notified of anemia with hgb of 6.9 <-- 7.0.  Hgb has been trending down for several days since admission.   Pt remains intubated.    eICU Interventions  Transfuse 1 unit pRBC.  Pt with sickle cell trait. Type and screen ordered.     Intervention Category Intermediate Interventions: Other:  [name] 11/25/2022, 8:27 PM

## 2022-11-26 DIAGNOSIS — I5021 Acute systolic (congestive) heart failure: Secondary | ICD-10-CM | POA: Diagnosis not present

## 2022-11-26 DIAGNOSIS — J9621 Acute and chronic respiratory failure with hypoxia: Secondary | ICD-10-CM | POA: Diagnosis not present

## 2022-11-26 LAB — TYPE AND SCREEN
ABO/RH(D): B POS
Antibody Screen: NEGATIVE
Unit division: 0

## 2022-11-26 LAB — BASIC METABOLIC PANEL
Anion gap: 12 (ref 5–15)
BUN: 13 mg/dL (ref 6–20)
CO2: 21 mmol/L — ABNORMAL LOW (ref 22–32)
Calcium: 8.4 mg/dL — ABNORMAL LOW (ref 8.9–10.3)
Chloride: 101 mmol/L (ref 98–111)
Creatinine, Ser: 0.54 mg/dL (ref 0.44–1.00)
GFR, Estimated: >60 mL/min (ref >60)
Glucose, Bld: 119 mg/dL — ABNORMAL HIGH (ref 70–99)
Potassium: 4.3 mmol/L (ref 3.5–5.1)
Sodium: 134 mmol/L — ABNORMAL LOW (ref 135–145)

## 2022-11-26 LAB — GLUCOSE, CAPILLARY
Glucose-Capillary: 114 mg/dL — ABNORMAL HIGH (ref 70–99)
Glucose-Capillary: 98 mg/dL (ref 70–99)
Glucose-Capillary: 111 mg/dL — ABNORMAL HIGH (ref 70–99)
Glucose-Capillary: 122 mg/dL — ABNORMAL HIGH (ref 70–99)
Glucose-Capillary: 109 mg/dL — ABNORMAL HIGH (ref 70–99)
Glucose-Capillary: 106 mg/dL — ABNORMAL HIGH (ref 70–99)

## 2022-11-26 LAB — BPAM RBC
Blood Product Expiration Date: 202403252359
ISSUE DATE / TIME: 202403232247
Unit Type and Rh: 7300

## 2022-11-26 LAB — CBC
HCT: 27.5 % — ABNORMAL LOW (ref 36.0–46.0)
Hemoglobin: 9.3 g/dL — ABNORMAL LOW (ref 12.0–15.0)
MCH: 31.3 pg (ref 26.0–34.0)
MCHC: 33.8 g/dL (ref 30.0–36.0)
MCV: 92.6 fL (ref 80.0–100.0)
Platelets: 341 10*3/uL (ref 150–400)
RBC: 2.97 MIL/uL — ABNORMAL LOW (ref 3.87–5.11)
RDW: 16.1 % — ABNORMAL HIGH (ref 11.5–15.5)
WBC: 10 10*3/uL (ref 4.0–10.5)
nRBC: 0.8 % — ABNORMAL HIGH (ref 0.0–0.2)

## 2022-11-26 LAB — PHOSPHORUS: Phosphorus: 2 mg/dL — ABNORMAL LOW (ref 2.5–4.6)

## 2022-11-26 LAB — MAGNESIUM: Magnesium: 2 mg/dL (ref 1.7–2.4)

## 2022-11-26 MED ORDER — SODIUM PHOSPHATES 45 MMOLE/15ML IV SOLN
15.0000 mmol | Freq: Once | INTRAVENOUS | Status: AC
  Administered 2022-11-26: 15 mmol via INTRAVENOUS
  Filled 2022-11-26: qty 5

## 2022-11-26 MED ORDER — SODIUM CHLORIDE 0.9 % IV SOLN
250.0000 mg | Freq: Every day | INTRAVENOUS | Status: AC
  Administered 2022-11-26 – 2022-11-27 (×2): 250 mg via INTRAVENOUS
  Filled 2022-11-26 (×2): qty 20

## 2022-11-26 MED ORDER — POTASSIUM & SODIUM PHOSPHATES 280-160-250 MG PO PACK
2.0000 | PACK | Freq: Three times a day (TID) | ORAL | Status: AC
  Administered 2022-11-26 (×3): 2
  Filled 2022-11-26 (×3): qty 2

## 2022-11-26 NOTE — Progress Notes (Signed)
Advanced Heart Failure Rounding Note  PCP-Cardiologist: Fransico Him, MD   Subjective:    Cath 3/14 c/w nonischemic cardiomyopathy, filling pressures minimally elevated on RHC. Excellent cardiac output by Fick. Extubated 3/15. Reintubated (3/16)   Failed SBT again this week. Awake on vent following commands. Currently having a hot flash. No CP.   Remains a-paced at 47. No further VT/VF/torsades  Plan for trach early this week.  SBP 90s  .   Objective:   Weight Range: 41.9 kg Body mass index is 16.36 kg/m.   Vital Signs:   Temp:  [98 F (36.7 C)-99.6 F (37.6 C)] 98 F (36.7 C) (03/24 1131) Pulse Rate:  [69-211] 72 (03/24 1129) Resp:  [12-24] 18 (03/24 1129) BP: (83-133)/(58-95) 90/61 (03/24 1100) SpO2:  [86 %-100 %] 96 % (03/24 1129) FiO2 (%):  [30 %-40 %] 30 % (03/24 1129) Weight:  [41.9 kg] 41.9 kg (03/24 0308) Last BM Date : 11/24/22  Weight change: Filed Weights   11/24/22 0500 11/25/22 0154 11/26/22 0308  Weight: 42.2 kg 41.9 kg 41.9 kg    Intake/Output:   Intake/Output Summary (Last 24 hours) at 11/26/2022 1247 Last data filed at 11/26/2022 0900 Gross per 24 hour  Intake 2885.37 ml  Output 2310 ml  Net 575.37 ml       Physical Exam  General: intubated awake following commands.Cachetic HEENT: +cortrak + ETT Neck: supple. no JVD. Carotids 2+ bilat; no bruits. No lymphadenopathy or thryomegaly appreciated. Cor: PMI nondisplaced. Regular rate & rhythm. No rubs, gallops or murmurs. Lungs: clear Abdomen: soft, nontender, nondistended. No hepatosplenomegaly. No bruits or masses. Good bowel sounds. Extremities: no cyanosis, clubbing, rash, edema Neuro: awake on vent following commands  Telemetry   A paced 70 No VT/VF/torsades (Personally reviewed)    Labs    CBC Recent Labs    11/25/22 0352 11/25/22 1928 11/26/22 0318  WBC 9.4  --  10.0  HGB 7.0* 6.9* 9.3*  HCT 22.1* 21.2* 27.5*  MCV 98.7  --  92.6  PLT 310  --  341    Basic  Metabolic Panel Recent Labs    11/25/22 0352 11/26/22 0318  NA 133* 134*  K 4.4 4.3  CL 103 101  CO2 20* 21*  GLUCOSE 142* 119*  BUN 15 13  CREATININE 0.59 0.54  CALCIUM 8.3* 8.4*  MG 2.3 2.0  PHOS 2.2* 2.0*    Liver Function Tests No results for input(s): "AST", "ALT", "ALKPHOS", "BILITOT", "PROT", "ALBUMIN" in the last 72 hours.  No results for input(s): "LIPASE", "AMYLASE" in the last 72 hours. Cardiac Enzymes No results for input(s): "CKTOTAL", "CKMB", "CKMBINDEX", "TROPONINI" in the last 72 hours.  BNP: BNP (last 3 results) Recent Labs    11/11/22 1238 11/17/22 2150  BNP 58.5 145.2*     ProBNP (last 3 results) No results for input(s): "PROBNP" in the last 8760 hours.   D-Dimer No results for input(s): "DDIMER" in the last 72 hours. Hemoglobin A1C No results for input(s): "HGBA1C" in the last 72 hours.  Fasting Lipid Panel No results for input(s): "CHOL", "HDL", "LDLCALC", "TRIG", "CHOLHDL", "LDLDIRECT" in the last 72 hours. Thyroid Function Tests No results for input(s): "TSH", "T4TOTAL", "T3FREE", "THYROIDAB" in the last 72 hours.  Invalid input(s): "FREET3"  Other results:   Imaging    No results found.   Medications:     Scheduled Medications:  Chlorhexidine Gluconate Cloth  6 each Topical Daily   enoxaparin (LOVENOX) injection  30 mg Subcutaneous Q24H  feeding supplement (PROSource TF20)  60 mL Per Tube Daily   fiber supplement (BANATROL TF)  60 mL Per Tube BID   insulin aspart  0-9 Units Subcutaneous Q4H   levothyroxine  112 mcg Per Tube QAC breakfast   mouth rinse  15 mL Mouth Rinse Q2H   oxyCODONE  5 mg Per Tube Q6H   pantoprazole (PROTONIX) IV  40 mg Intravenous QHS   potassium & sodium phosphates  2 packet Per Tube TID WC & HS   sodium chloride flush  3 mL Intravenous Q12H   spironolactone  12.5 mg Oral Daily   sucralfate  1 g Per Tube BID    Infusions:  sodium chloride Stopped (11/26/22 0854)   ceFEPime (MAXIPIME) IV 200  mL/hr at 11/26/22 0900   dexmedetomidine (PRECEDEX) IV infusion 1 mcg/kg/hr (11/26/22 0900)   feeding supplement (VITAL 1.5 CAL) 45 mL/hr at 11/26/22 0900   fentaNYL infusion INTRAVENOUS 50 mcg/hr (11/26/22 0900)   ferric gluconate (FERRLECIT) IVPB 250 mg (11/26/22 1042)    PRN Medications: sodium chloride, acetaminophen, diphenhydrAMINE, docusate, fentaNYL, guaiFENesin, ipratropium-albuterol, lidocaine, lip balm, melatonin, midazolam, mouth rinse, polyethylene glycol, sodium chloride flush  Patient Profile   52 y.o. female with a hx of tobacco use, COPD, myotonic dystrophy type I since age 59, high grade heart block s/p PPM, RBBB, sickle cell trait, and DM1 (questionable diagnosis) and strong family history of heart failure (father s/p cardiac transplant and paternal aunt w/ NICM w/ LVAD), admitted w/ acute hypoxic respiratory failure/ LLL CAP secondary to metapneumovirus. Hospital course c/b VF arrest. Post arrest echo w/ severe BiV failure, LVEF < 20%, RV severely reduced + diffuse HK   Assessment/Plan  1. VF arrest: 3/12, shocked. 15 minutes CPR total.  - Due to long-short interval (R-on-T) in the setting of QT-prolonging drugs (azithro) and hypokalemia - Pacemaker lower rate limit increased to 90 to prevent pause-dependent TdP; now down to 70 - Per EP: may require upgrade with LV lead +/-upgrade to primary prevention ICD if EF does not recover with GDMT. VT this admission was attributable to a reversible cause, so ICD not indicated urgently for secondary prevention. Can consider placement of LifeVest on discharge but given stability of rhythm and need for chronic trach care would likely defer. - No arrhythmias on tele - Keep K> 4.0 Mg > 2.0 - avoid QT prolonging agents  2. Acute hypoxemic respiratory failure: Intubated post-VF arrest.  Had viral PNA with metapneumovirus at presentation.  - extubated 3/14 Reintubated 3/16 - Failed multiple SBTs - Plan for trach early this week - CCM  managing.   3. Acute systolic CHF: Prior workup for cardiac sarcoidosis. Cardiac MRI in 2017 showed subendocardial LGE in the mid inferior and inferolateral walls with EF 56%.  Cardiac PET done after this was NOT suggestive of cardiac sarcoidosis.  - Echo in 5/22 with normal EF.   - Echo this admission reviewed, EF 20-25% range with diffuse hypokinesis but relatively preserved function at the apex, moderate RV dysfunction.  - cath 3/15 no CAD - suspect due to myotonic dystrophy type 1 -> she has an aunt with LVAD who has cardiomyopathy due to myotonic dystrophy.  Father had a heart transplant, uncertain of underlying condition. R/LHC c/w Nonischemic cardiomyopathy.  - CVP ok. Off lasix - Continue spiro 12.5 mg daily - Off pressors. No room for GDMT with low BP (SBP 90s) - Given overall debility, she is not currently a candidate for advanced therapies - Would consider palliative care input  4. High grade heart block: Has MDT PPM with His bundle lead.  This is likely due to cardiac involvement of myotonic dystrophy type 1.  - plan as above  5. Myotonic dystrophy: Type 1, diagnosed at age 55. Has family members with the same diagnosis.  Followed by neurology.   6. Tobacco abuse - needs cessation  7. Hypokalemia/hypomag - supp as needed  8. Anemia of chronic illness - hgb 7.0 -> 9.3 after transfusion  CRITICAL CARE Performed by: Glori Bickers  Total critical care time: 35 minutes  Critical care time was exclusive of separately billable procedures and treating other patients.  Critical care was necessary to treat or prevent imminent or life-threatening deterioration.  Critical care was time spent personally by me (independent of midlevel providers or residents) on the following activities: development of treatment plan with patient and/or surrogate as well as nursing, discussions with consultants, evaluation of patient's response to treatment, examination of patient, obtaining  history from patient or surrogate, ordering and performing treatments and interventions, ordering and review of laboratory studies, ordering and review of radiographic studies, pulse oximetry and re-evaluation of patient's condition.   Length of Stay: [place], MD  11/26/2022, 12:47 PM  Advanced Heart Failure Team Pager [phone] (M-F; 7a - 5p)  Please contact [facility] for night-coverage after hours (5p -7a ) and weekends on [email or web address]

## 2022-11-26 NOTE — Progress Notes (Signed)
[facility] ADULT ICU REPLACEMENT PROTOCOL   The patient does apply for the [facility] Adult ICU Electrolyte Replacment Protocol based on the criteria listed below:   1.Exclusion criteria: TCTS, ECMO, Dialysis, and Myasthenia Gravis patients 2. Is GFR >/= 30 ml/min? Yes.    Patient's GFR today is >60 3. Is SCr </= 2? Yes.   Patient's SCr is 0.54 mg/dL 4. Did SCr increase >/= 0.5 in 24 hours? No. 5.Pt's weight >40kg  Yes.   6. Abnormal electrolyte(s): Phos  7. Electrolytes replaced per protocol 8.  Call MD STAT for K+ </= 2.5, Phos </= 1, or Mag </= 1 Physician:  Sherlene Shams [facility] 11/26/2022 5:19 AM

## 2022-11-26 NOTE — Progress Notes (Signed)
Pt found with EET at 21, Advanced to 24 with a reminder to not pull on tube. Soft restraint applied.

## 2022-11-26 NOTE — Progress Notes (Signed)
NAME:  Cynthia Underwood, MRN:  [id number], DOB:  1971/05/23, LOS: 68 ADMISSION DATE:  11/11/2022 CONSULTATION DATE:  11/14/2022 REFERRING MD:  Wyline Copas - TRH CHIEF COMPLAINT:  Vfib arrest on floor   History of Present Illness:   52 year old woman who initially presented to [facility] 3/9 for cough and progressive dyspnea x 4 days. Also reported diarrhea and 1 episode of epigastric pain with vomiting. PMHx significant for RBBB/high-grade heart block (s/p PPM), hypothyroidism, sickle cell trait, myotonic dystrophy (dx age 60), tobacco use.  Noted to be hypoxic in ED with O2 sat 88% on RA, CTA Chest c/f LLL PNA with mild bronchiectasis, negative for PE. Also c/f esophagitis. Patient was empirically started on CAP coverage (ceftriaxone, azithromycin) and GERD treatment (PPI/sucralfate). Also found to be positive for human metapneumovirus. Admitted to [facility].  Patient was initially progressing as expected 3/11-3/12 until ~1400, sat was noted to be in 80s. Placed on 5L humidified oxygen with slight improvement in sats. Solumedrol and DuoNebs were ordered. Around 1900 patient's RN was informed by telemetry of VF and was found unresponsive and pulseless. Code Blue was called with CPR x 15 minutes and ROSC. Amiodarone, Epi and defib were administered. Patient was intubated by anesthesia peri-code. IO was placed and Levophed was initiated. Cardiology was consulted.  PCCM was consulted for ICU admission post-arrest.  Pertinent Medical History:  Heart block, Goiter, Sickle cell trait, Myotonic dystrophy  Significant Hospital Events: Including procedures, antibiotic start and stop dates in addition to other pertinent events   3/9 - Presented to [facility] for LLL PNA. Started on Surveyor, quantity. +human metapneumovirus. 3/12 - Hypoxic requiring 5LNC. Later VF arrest on floor, CPR 3/13 echocardiogram LVEF severely depressed 50% (decreased compared with 01/2021), severely reduced RV function 3/14 Cardiac catheterization showed  normal coronaries, new normal filling pressures and normal PA pressure.  With excellent cardiac output 3/15 Extubated.  3/16 Reintubated for increased work of breathing, failed BiPAP 3/18 alert, interactive off vasopressors 3/20 Wean, trial extubation today  3/21 No acute issues overnight, failed early am wean due to apnea on 50 Fentanyl and 0.8 Pecedex  3/24 PRBC transfusion  Interim History / Subjective:  Needed transfusion overnight.  Objective:  Blood pressure 94/64, pulse 70, temperature 99.6 F (37.6 C), temperature source Oral, resp. rate 16, height 5\' 3"  (1.6 m), weight 41.9 kg, last menstrual period 12/03/2016, SpO2 94 %.    Vent Mode: PSV;CPAP FiO2 (%):  [30 %-40 %] 30 % Set Rate:  [24 bmp] 24 bmp Vt Set:  [410 mL] 410 mL PEEP:  [5 cmH20] 5 cmH20 Pressure Support:  [12 cmH20-16 cmH20] 12 cmH20 Plateau Pressure:  [15 cmH20-22 cmH20] 21 cmH20   Intake/Output Summary (Last 24 hours) at 11/26/2022 1004 Last data filed at 11/26/2022 0900 Gross per 24 hour  Intake 3168.66 ml  Output 2310 ml  Net 858.66 ml   Filed Weights   11/24/22 0500 11/25/22 0154 11/26/22 0308  Weight: 42.2 kg 41.9 kg 41.9 kg   Physical Examination:  General - sedated Eyes - pupils reactive ENT - ETT in place Cardiac - regular rate/rhythm, no murmur Chest - equal breath sounds b/l, no wheezing or rales Abdomen - soft, non tender, + bowel sounds Extremities - decreased muscle bulk Skin - no rashes Neuro - follows simple commands  Resolved Hospital Problem List:  Septic shock from pneumonia, Cardiogenic shock from cardiac arrest, Shock liver in setting of cardiac arrest  Assessment & Plan:   Post-Vfib arrest. Elevated troponin. Heart block s/p MDT  PPM. New acute non ischemic systolic CHF with EF 123456. - CHF team following  Acute hypoxic respiratory failure from cardiac arrest, CHF, Metapneumovirus pneumonia, HCAP, muscle weakness from myotonic dystrophy. - continue full vent support for  now - goal SpO2 > 92% - f/u CXR intermittently - plan for tracheostomy week of 3/25 - day 3 of cefepime, f/u blood culture from Q000111Q   Acute metabolic encephalopathy from anoxia, sepsis. - RASS goal 0 to -1   Hyperglycemia. - SSI   GERD with esophagitis. -- Continue PPI   Hypothyroidism. - Continue levothyroxine   Constipation. -- hold Bowel regimen now with diarrhea  Anemia of iron deficiency, critical illness and chronic disease. - f/u CBC - transfuse for Hb < 7 - start iron replacement  Hypophosphatemia. - continue replacement  Best Practice: (right click and "Reselect all SmartList Selections" daily)   Diet/type: tubefeeds DVT prophylaxis: Lovenox GI prophylaxis: PPI Lines: Central line Foley:  N/A Code Status:  full code Last date of multidisciplinary goals of care discussion [updated her mother at bedside]  Labs:      Latest Ref Rng & Units 11/26/2022    3:18 AM 11/25/2022    3:52 AM 11/24/2022    4:02 AM  CMP  Glucose 70 - 99 mg/dL 119  142  90   BUN 6 - 20 mg/dL 13  15  12    Creatinine 0.44 - 1.00 mg/dL 0.54  0.59  0.54   Sodium 135 - 145 mmol/L 134  133  137   Potassium 3.5 - 5.1 mmol/L 4.3  4.4  3.6   Chloride 98 - 111 mmol/L 101  103  107   CO2 22 - 32 mmol/L 21  20  22    Calcium 8.9 - 10.3 mg/dL 8.4  8.3  8.2        Latest Ref Rng & Units 11/26/2022    3:18 AM 11/25/2022    7:28 PM 11/25/2022    3:52 AM  CBC  WBC 4.0 - 10.5 K/uL 10.0   9.4   Hemoglobin 12.0 - 15.0 g/dL 9.3  6.9  7.0   Hematocrit 36.0 - 46.0 % 27.5  21.2  22.1   Platelets 150 - 400 K/uL 341   310     ABG    Component Value Date/Time   PHART 7.48 (H) 11/18/2022 2306   PCO2ART 36 11/18/2022 2306   PO2ART 137 (H) 11/18/2022 2306   HCO3 26.9 11/18/2022 2306   TCO2 28 11/18/2022 1931   ACIDBASEDEF 2.0 11/18/2022 0517   O2SAT 75.9 11/23/2022 0325    CBG (last 3)  Recent Labs    11/25/22 2329 11/26/22 0321 11/26/22 0755  GLUCAP 114* 114* 98    Signature:  Chesley Mires, MD [place] Pager - [phone] or [phone] - [phone] 11/26/2022, 10:04 AM

## 2022-11-27 ENCOUNTER — Inpatient Hospital Stay (HOSPITAL_COMMUNITY): Payer: 59

## 2022-11-27 DIAGNOSIS — J9601 Acute respiratory failure with hypoxia: Secondary | ICD-10-CM | POA: Diagnosis not present

## 2022-11-27 LAB — BASIC METABOLIC PANEL
Anion gap: 11 (ref 5–15)
BUN: 13 mg/dL (ref 6–20)
CO2: 22 mmol/L (ref 22–32)
Calcium: 8 mg/dL — ABNORMAL LOW (ref 8.9–10.3)
Chloride: 101 mmol/L (ref 98–111)
Creatinine, Ser: 0.57 mg/dL (ref 0.44–1.00)
GFR, Estimated: >60 mL/min (ref >60)
Glucose, Bld: 120 mg/dL — ABNORMAL HIGH (ref 70–99)
Potassium: 4.2 mmol/L (ref 3.5–5.1)
Sodium: 134 mmol/L — ABNORMAL LOW (ref 135–145)

## 2022-11-27 LAB — CBC
HCT: 29 % — ABNORMAL LOW (ref 36.0–46.0)
Hemoglobin: 9.4 g/dL — ABNORMAL LOW (ref 12.0–15.0)
MCH: 31 pg (ref 26.0–34.0)
MCHC: 32.4 g/dL (ref 30.0–36.0)
MCV: 95.7 fL (ref 80.0–100.0)
Platelets: 406 10*3/uL — ABNORMAL HIGH (ref 150–400)
RBC: 3.03 MIL/uL — ABNORMAL LOW (ref 3.87–5.11)
RDW: 17 % — ABNORMAL HIGH (ref 11.5–15.5)
WBC: 13.4 10*3/uL — ABNORMAL HIGH (ref 4.0–10.5)
nRBC: 1 % — ABNORMAL HIGH (ref 0.0–0.2)

## 2022-11-27 LAB — GLUCOSE, CAPILLARY
Glucose-Capillary: 107 mg/dL — ABNORMAL HIGH (ref 70–99)
Glucose-Capillary: 108 mg/dL — ABNORMAL HIGH (ref 70–99)
Glucose-Capillary: 110 mg/dL — ABNORMAL HIGH (ref 70–99)
Glucose-Capillary: 117 mg/dL — ABNORMAL HIGH (ref 70–99)
Glucose-Capillary: 121 mg/dL — ABNORMAL HIGH (ref 70–99)
Glucose-Capillary: 131 mg/dL — ABNORMAL HIGH (ref 70–99)

## 2022-11-27 LAB — MAGNESIUM: Magnesium: 1.9 mg/dL (ref 1.7–2.4)

## 2022-11-27 LAB — PHOSPHORUS: Phosphorus: 1.9 mg/dL — ABNORMAL LOW (ref 2.5–4.6)

## 2022-11-27 MED ORDER — MIDAZOLAM HCL 2 MG/2ML IJ SOLN
5.0000 mg | Freq: Once | INTRAMUSCULAR | Status: AC
  Administered 2022-11-27: 2 mg via INTRAVENOUS
  Filled 2022-11-27: qty 6

## 2022-11-27 MED ORDER — MAGNESIUM SULFATE 2 GM/50ML IV SOLN
2.0000 g | Freq: Once | INTRAVENOUS | Status: AC
  Administered 2022-11-27: 2 g via INTRAVENOUS
  Filled 2022-11-27: qty 50

## 2022-11-27 MED ORDER — FENTANYL CITRATE PF 50 MCG/ML IJ SOSY: 200.0000 ug | PREFILLED_SYRINGE | Freq: Once | INTRAMUSCULAR | Status: DC

## 2022-11-27 MED ORDER — LIDOCAINE-EPINEPHRINE (PF) 2 %-1:200000 IJ SOLN
INTRAMUSCULAR | Status: AC
  Filled 2022-11-27: qty 20

## 2022-11-27 MED ORDER — FUROSEMIDE 40 MG PO TABS
20.0000 mg | ORAL_TABLET | Freq: Every day | ORAL | Status: DC
  Administered 2022-11-27: 20 mg via ORAL
  Filled 2022-11-27: qty 1

## 2022-11-27 MED ORDER — SPIRONOLACTONE 12.5 MG HALF TABLET
12.5000 mg | ORAL_TABLET | Freq: Every day | ORAL | Status: DC
  Administered 2022-11-27: 12.5 mg
  Filled 2022-11-27 (×2): qty 1

## 2022-11-27 MED ORDER — PHENYLEPHRINE 80 MCG/ML (10ML) SYRINGE FOR IV PUSH (FOR BLOOD PRESSURE SUPPORT)
80.0000 ug | PREFILLED_SYRINGE | Freq: Once | INTRAVENOUS | Status: DC | PRN
  Filled 2022-11-27: qty 10

## 2022-11-27 MED ORDER — SODIUM PHOSPHATES 45 MMOLE/15ML IV SOLN
30.0000 mmol | Freq: Once | INTRAVENOUS | Status: AC
  Administered 2022-11-27: 30 mmol via INTRAVENOUS
  Filled 2022-11-27: qty 10

## 2022-11-27 MED ORDER — ROCURONIUM BROMIDE 10 MG/ML (PF) SYRINGE
50.0000 mg | PREFILLED_SYRINGE | Freq: Once | INTRAVENOUS | Status: AC
  Administered 2022-11-27: 50 mg via INTRAVENOUS
  Filled 2022-11-27: qty 10

## 2022-11-27 MED ORDER — QUETIAPINE FUMARATE 25 MG PO TABS
25.0000 mg | ORAL_TABLET | Freq: Two times a day (BID) | ORAL | Status: DC
  Administered 2022-11-27 – 2022-11-28 (×3): 25 mg
  Filled 2022-11-27 (×3): qty 1

## 2022-11-27 MED ORDER — ETOMIDATE 2 MG/ML IV SOLN
20.0000 mg | Freq: Once | INTRAVENOUS | Status: AC
  Administered 2022-11-27: 20 mg via INTRAVENOUS
  Filled 2022-11-27: qty 10

## 2022-11-27 MED ORDER — FUROSEMIDE 40 MG PO TABS
20.0000 mg | ORAL_TABLET | Freq: Every day | ORAL | Status: DC
  Administered 2022-11-28 – 2022-11-29 (×2): 20 mg
  Filled 2022-11-27 (×2): qty 1

## 2022-11-27 NOTE — Progress Notes (Signed)
SLP Cancellation Note  Patient Details Name: Cynthia Underwood MRN: [id number] DOB: 15-Jul-1971   Cancelled treatment:       Reason Eval/Treat Not Completed: Patient not medically ready Patient with new tracheostomy 11/27/2022 and remains on the vent at this time. Orders for SLP eval and treat for PMSV and swallowing received. Will follow pt closely for readiness for SLP interventions as appropriate.   Tayari Yankee I. Hardin Negus, [place], [place] Office number [phone]  Horton Marshall 11/27/2022, 2:29 PM

## 2022-11-27 NOTE — Progress Notes (Signed)
NAME:  Cynthia Underwood, MRN:  [id number], DOB:  1970-12-16, LOS: 44 ADMISSION DATE:  11/11/2022 CONSULTATION DATE:  11/14/2022 REFERRING MD:  Wyline Copas - TRH CHIEF COMPLAINT:  Vfib arrest on floor   History of Present Illness:   52 year old woman who initially presented to [facility] 3/9 for cough and progressive dyspnea x 4 days. Also reported diarrhea and 1 episode of epigastric pain with vomiting. PMHx significant for RBBB/high-grade heart block (s/p PPM), hypothyroidism, sickle cell trait, myotonic dystrophy (dx age 63), tobacco use.  Noted to be hypoxic in ED with O2 sat 88% on RA, CTA Chest c/f LLL PNA with mild bronchiectasis, negative for PE. Also c/f esophagitis. Patient was empirically started on CAP coverage (ceftriaxone, azithromycin) and GERD treatment (PPI/sucralfate). Also found to be positive for human metapneumovirus. Admitted to [facility].  Patient was initially progressing as expected 3/11-3/12 until ~1400, sat was noted to be in 80s. Placed on 5L humidified oxygen with slight improvement in sats. Solumedrol and DuoNebs were ordered. Around 1900 patient's RN was informed by telemetry of VF and was found unresponsive and pulseless. Code Blue was called with CPR x 15 minutes and ROSC. Amiodarone, Epi and defib were administered. Patient was intubated by anesthesia peri-code. IO was placed and Levophed was initiated. Cardiology was consulted.  PCCM was consulted for ICU admission post-arrest.  Pertinent Medical History:  Heart block, Goiter, Sickle cell trait, Myotonic dystrophy  Significant Hospital Events: Including procedures, antibiotic start and stop dates in addition to other pertinent events   3/9 - Presented to [facility] for LLL PNA. Started on Surveyor, quantity. +human metapneumovirus. 3/12 - Hypoxic requiring 5LNC. Later VF arrest on floor, CPR 3/13 echocardiogram LVEF severely depressed 50% (decreased compared with 01/2021), severely reduced RV function 3/14 Cardiac catheterization showed  normal coronaries, new normal filling pressures and normal PA pressure.  With excellent cardiac output 3/15 Extubated.  3/16 Reintubated for increased work of breathing, failed BiPAP 3/18 alert, interactive off vasopressors 3/20 Wean, trial extubation today  3/21 No acute issues overnight, failed early am wean due to apnea on 50 Fentanyl and 0.8 Pecedex  3/24 PRBC transfusion  Interim History / Subjective:   No acute events.  Remains on the ventilator. Failed PSV weans  Objective:  Blood pressure 113/70, pulse 71, temperature 98.2 F (36.8 C), temperature source Oral, resp. rate (!) 24, height 5\' 3"  (1.6 m), weight 41.6 kg, last menstrual period 12/03/2016, SpO2 94 %.    Vent Mode: PRVC FiO2 (%):  [30 %] 30 % Set Rate:  [24 bmp] 24 bmp Vt Set:  [410 mL] 410 mL PEEP:  [5 cmH20] 5 cmH20 Pressure Support:  [10 cmH20-16 cmH20] 10 cmH20 Plateau Pressure:  [22 cmH20] 22 cmH20   Intake/Output Summary (Last 24 hours) at 11/27/2022 0805 Last data filed at 11/27/2022 0700 Gross per 24 hour  Intake 3004.42 ml  Output 1654 ml  Net 1350.42 ml   Filed Weights   11/25/22 0154 11/26/22 0308 11/27/22 0500  Weight: 41.9 kg 41.9 kg 41.6 kg   Physical Examination: Gen:      No acute distress HEENT:  EOMI, sclera anicteric Neck:     No masses; no thyromegaly, ETT Lungs:    Clear to auscultation bilaterally; normal respiratory effort CV:         Regular rate and rhythm; no murmurs Abd:      + bowel sounds; soft, non-tender; no palpable masses, no distension Ext:    No edema; adequate peripheral perfusion Skin:  Warm and dry; no rash Neuro: Somnolent  Labs, imaging reviewed Significant for sodium 134, BUN/creatinine 13/0.57, Phos 1.9 WBC 13.4, hemoglobin 9.4, platelets 406 No new imaging  Resolved Hospital Problem List:  Septic shock from pneumonia, Cardiogenic shock from cardiac arrest, Shock liver in setting of cardiac arrest  Assessment & Plan:   Post-Vfib arrest. Elevated  troponin. Heart block s/p MDT PPM. New acute non ischemic systolic CHF with EF 123456. CHF team following  Acute hypoxic respiratory failure from cardiac arrest, CHF, Metapneumovirus pneumonia, HCAP, muscle weakness from myotonic dystrophy. - continue full vent support for now - goal SpO2 > 92% - f/u CXR intermittently - plan for tracheostomy week of 3/25 - day 4 of cefepime, f/u blood culture from Q000111Q   Acute metabolic encephalopathy from anoxia, sepsis. - RASS goal 0 to -1   Hyperglycemia. - SSI   GERD with esophagitis. -- Continue PPI   Hypothyroidism. - Continue levothyroxine   Constipation. -- hold Bowel regimen now with diarrhea  Anemia of iron deficiency, critical illness and chronic disease. - f/u CBC - transfuse for Hb < 7 - start iron replacement  Hypophosphatemia. - continue replacement  Best Practice: (right click and "Reselect all SmartList Selections" daily)   Diet/type: tubefeeds DVT prophylaxis: Lovenox GI prophylaxis: PPI Lines: Central line Foley:  N/A Code Status:  full code Last date of multidisciplinary goals of care discussion [updated her mother at bedside]  Signature:   The patient is critically ill with multiple organ system failure and requires high complexity decision making for assessment and support, frequent evaluation and titration of therapies, advanced monitoring, review of radiographic studies and interpretation of complex data.   Critical Care Time devoted to patient care services, exclusive of separately billable procedures, described in this note is 35 minutes.   Marshell Garfinkel MD [facility] See Amion for pager  If no response to pager , please call [phone] until 7pm After 7:00 pm call Elink  [phone] 11/27/2022, 8:06 AM

## 2022-11-27 NOTE — Progress Notes (Signed)
[facility] ADULT ICU REPLACEMENT PROTOCOL   The patient does apply for the [facility] Adult ICU Electrolyte Replacment Protocol based on the criteria listed below:   1.Exclusion criteria: TCTS, ECMO, Dialysis, and Myasthenia Gravis patients 2. Is GFR >/= 30 ml/min? Yes.    Patient's GFR today is >60 3. Is SCr </= 2? Yes.   Patient's SCr is 0.57 mg/dL 4. Did SCr increase >/= 0.5 in 24 hours? No. 5.Pt's weight >40kg  Yes.   6. Abnormal electrolyte(s): Phos, Mag  7. Electrolytes replaced per protocol 8.  Call MD STAT for K+ </= 2.5, Phos </= 1, or Mag </= 1 Physician:  Sherlene Shams [facility] 11/27/2022 4:21 AM

## 2022-11-27 NOTE — TOC Progression Note (Signed)
Transition of Care [facility]) - Progression Note    Patient Details  Name: Cynthia Underwood MRN: [id number] Date of Birth: 03-06-1971  Transition of Care [facility] CM/SW [place], RN Phone Number: 11/27/2022, 2:45 PM  Clinical Narrative:     Percutaneous Tracheostomy with Bronchoscopic Guidance today. TOC following.   Expected Discharge Plan: Long Term Acute Care (LTAC) Barriers to Discharge: Continued Medical Work up  Expected Discharge Plan and Services   Discharge Planning Services: CM Consult Post Acute Care Choice: Long Term Acute Care (LTAC)                                         Social Determinants of Health (SDOH) Interventions SDOH Screenings   Food Insecurity: No Food Insecurity (10/16/2022)  Housing: Low Risk  (10/16/2022)  Transportation Needs: No Transportation Needs (10/16/2022)  Utilities: Not At Risk (10/16/2022)  Alcohol Screen: Low Risk  (10/16/2022)  Depression (PHQ2-9): Medium Risk (11/08/2022)  Financial Resource Strain: Low Risk  (10/16/2022)  Physical Activity: Inactive (10/16/2022)  Social Connections: Socially Isolated (10/16/2022)  Stress: Stress Concern Present (10/16/2022)  Tobacco Use: High Risk (11/17/2022)    Readmission Risk Interventions     No data to display

## 2022-11-27 NOTE — Progress Notes (Signed)
Advanced Heart Failure Rounding Note  PCP-Cardiologist: Fransico Him, MD   Subjective:    Cath 3/14 c/w nonischemic cardiomyopathy, filling pressures minimally elevated on RHC. Excellent cardiac output by Fick. Extubated 3/15. Reintubated (3/16)  Remains a-paced at 39. No further VT/VF/torsades  Plan for trach early this week.    Feels ok this am, mom at bedside. Denies CP/SOB.   Objective:   Weight Range: 41.6 kg Body mass index is 16.25 kg/m.   Vital Signs:   Temp:  [98 F (36.7 C)-99.6 F (37.6 C)] 99.1 F (37.3 C) (03/25 0324) Pulse Rate:  [69-104] 70 (03/25 0630) Resp:  [12-30] 24 (03/25 0630) BP: (86-129)/(57-85) 106/71 (03/25 0630) SpO2:  [86 %-100 %] 94 % (03/25 0630) FiO2 (%):  [30 %] 30 % (03/25 0400) Weight:  [41.6 kg] 41.6 kg (03/25 0500) Last BM Date : 11/26/22  Weight change: Filed Weights   11/25/22 0154 11/26/22 0308 11/27/22 0500  Weight: 41.9 kg 41.9 kg 41.6 kg   Intake/Output:   Intake/Output Summary (Last 24 hours) at 11/27/2022 0745 Last data filed at 11/27/2022 0600 Gross per 24 hour  Intake 2983.22 ml  Output 1714 ml  Net 1269.22 ml   Physical Exam  CVP 9 General:  acutely ill appearing. HEENT: +cortrak +ETT Neck: supple. JVD ~9 cm. Carotids 2+ bilat; no bruits. No lymphadenopathy or thyromegaly appreciated. Cor: PMI nondisplaced. Regular rate & rhythm. No rubs, gallops or murmurs. Lungs: clear Abdomen: soft, nontender, nondistended. No hepatosplenomegaly. No bruits or masses. Good bowel sounds. GU: + Foley Extremities: no cyanosis, clubbing, rash, edema  Neuro: intubated, lightly sedated. Nods appropriately  Telemetry   A paced 70 No VT/VF/torsades (Personally reviewed)    Labs    CBC Recent Labs    11/26/22 0318 11/27/22 0313  WBC 10.0 13.4*  HGB 9.3* 9.4*  HCT 27.5* 29.0*  MCV 92.6 95.7  PLT 341 [id number]*   Basic Metabolic Panel Recent Labs    11/26/22 0318 11/27/22 0313  NA 134* 134*  K 4.3 4.2  CL 101 101   CO2 21* 22  GLUCOSE 119* 120*  BUN 13 13  CREATININE 0.54 0.57  CALCIUM 8.4* 8.0*  MG 2.0 1.9  PHOS 2.0* 1.9*   Liver Function Tests No results for input(s): "AST", "ALT", "ALKPHOS", "BILITOT", "PROT", "ALBUMIN" in the last 72 hours.  No results for input(s): "LIPASE", "AMYLASE" in the last 72 hours. Cardiac Enzymes No results for input(s): "CKTOTAL", "CKMB", "CKMBINDEX", "TROPONINI" in the last 72 hours.  BNP: BNP (last 3 results) Recent Labs    11/11/22 1238 11/17/22 2150  BNP 58.5 145.2*   ProBNP (last 3 results) No results for input(s): "PROBNP" in the last 8760 hours.  D-Dimer No results for input(s): "DDIMER" in the last 72 hours. Hemoglobin A1C No results for input(s): "HGBA1C" in the last 72 hours.  Fasting Lipid Panel No results for input(s): "CHOL", "HDL", "LDLCALC", "TRIG", "CHOLHDL", "LDLDIRECT" in the last 72 hours. Thyroid Function Tests No results for input(s): "TSH", "T4TOTAL", "T3FREE", "THYROIDAB" in the last 72 hours.  Invalid input(s): "FREET3"  Other results:  Imaging   No results found.  Medications:     Scheduled Medications:  Chlorhexidine Gluconate Cloth  6 each Topical Daily   enoxaparin (LOVENOX) injection  30 mg Subcutaneous Q24H   feeding supplement (PROSource TF20)  60 mL Per Tube Daily   fiber supplement (BANATROL TF)  60 mL Per Tube BID   insulin aspart  0-9 Units Subcutaneous Q4H   levothyroxine  112 mcg Per Tube QAC breakfast   mouth rinse  15 mL Mouth Rinse Q2H   oxyCODONE  5 mg Per Tube Q6H   pantoprazole (PROTONIX) IV  40 mg Intravenous QHS   sodium chloride flush  3 mL Intravenous Q12H   spironolactone  12.5 mg Oral Daily   sucralfate  1 g Per Tube BID    Infusions:  sodium chloride 10 mL/hr at 11/27/22 0600   ceFEPime (MAXIPIME) IV Stopped (11/26/22 2116)   dexmedetomidine (PRECEDEX) IV infusion 1.8 mcg/kg/hr (11/27/22 0600)   feeding supplement (VITAL 1.5 CAL) 1,000 mL (11/27/22 0600)   fentaNYL infusion  INTRAVENOUS 100 mcg/hr (11/27/22 0600)   ferric gluconate (FERRLECIT) IVPB Stopped (11/26/22 1242)   sodium phosphate 30 mmol in dextrose 5 % 250 mL infusion 43 mL/hr at 11/27/22 0600    PRN Medications: sodium chloride, acetaminophen, diphenhydrAMINE, docusate, fentaNYL, guaiFENesin, ipratropium-albuterol, lidocaine, lip balm, melatonin, midazolam, mouth rinse, polyethylene glycol, sodium chloride flush  Patient Profile   52 y.o. female with a hx of tobacco use, COPD, myotonic dystrophy type I since age 69, high grade heart block s/p PPM, RBBB, sickle cell trait, and DM1 (questionable diagnosis) and strong family history of heart failure (father s/p cardiac transplant and paternal aunt w/ NICM w/ LVAD), admitted w/ acute hypoxic respiratory failure/ LLL CAP secondary to metapneumovirus. Hospital course c/b VF arrest. Post arrest echo w/ severe BiV failure, LVEF < 20%, RV severely reduced + diffuse HK   Assessment/Plan  1. VF arrest: 3/12, shocked. 15 minutes CPR total.  - Due to long-short interval (R-on-T) in the setting of QT-prolonging drugs (azithro) and hypokalemia - Pacemaker lower rate limit increased to 90 to prevent pause-dependent TdP; now down to 70 - Per EP: may require upgrade with LV lead +/-upgrade to primary prevention ICD if EF does not recover with GDMT. VT this admission was attributable to a reversible cause, so ICD not indicated urgently for secondary prevention. Can consider placement of LifeVest on discharge but given stability of rhythm and need for chronic trach care would likely defer. - No arrhythmias on tele - Keep K> 4.0 Mg > 2.0 - avoid QT prolonging agents  2. Acute hypoxemic respiratory failure: Intubated post-VF arrest.  Had viral PNA with metapneumovirus at presentation.  - extubated 3/14 Reintubated 3/16 - Failed multiple SBTs - Plan for trach early this week - CCM managing.   3. Acute systolic CHF: Prior workup for cardiac sarcoidosis. Cardiac MRI in  2017 showed subendocardial LGE in the mid inferior and inferolateral walls with EF 56%.  Cardiac PET done after this was NOT suggestive of cardiac sarcoidosis.  - Echo in 5/22 with normal EF.   - Echo this admission reviewed, EF 20-25% range with diffuse hypokinesis but relatively preserved function at the apex, moderate RV dysfunction.  - cath 3/15 no CAD - suspect due to myotonic dystrophy type 1 -> she has an aunt with LVAD who has cardiomyopathy due to myotonic dystrophy.  Father had a heart transplant, uncertain of underlying condition. R/LHC c/w Nonischemic cardiomyopathy.  - CVP 9. Start 20 PO lasix daily - Continue spiro 12.5 mg daily - SGLT2i when foley removed - Off pressors. No room for GDMT with low BP (SBP 90s) - Given overall debility, she is not currently a candidate for advanced therapies - Would consider palliative care input    4. High grade heart block: Has MDT PPM with His bundle lead.  This is likely due to cardiac involvement of myotonic dystrophy  type 1.  - plan as above  5. Myotonic dystrophy: Type 1, diagnosed at age 50. Has family members with the same diagnosis.  Followed by neurology.   6. Tobacco abuse - needs cessation  7. Hypokalemia/hypomag - supp as needed  8. Anemia of chronic illness - hgb 7.0 -> 9.4. s/p 1uPRBC  Length of Stay: [place], NP  11/27/2022, 7:45 AM  Advanced Heart Failure Team Pager [phone] (M-F; 7a - 5p)  Please contact [facility] for night-coverage after hours (5p -7a ) and weekends on [email or web address]

## 2022-11-27 NOTE — Procedures (Signed)
Percutaneous Tracheostomy Procedure Note   Cynthia Underwood  [id number]  Feb 25, 1971  Date:11/27/22  Time:12:24 PM   Provider Performing:Akiba Melfi C Tamala Julian  Procedure: Percutaneous Tracheostomy with Bronchoscopic Guidance (31600)  Indication(s) Persistent resp failure  Consent Risks of the procedure as well as the alternatives and risks of each were explained to the patient and/or caregiver.  Consent for the procedure was obtained.  Anesthesia Etomidate, Versed, Fentanyl, Vecuronium   Time Out Verified patient identification, verified procedure, site/side was marked, verified correct patient position, special equipment/implants available, medications/allergies/relevant history reviewed, required imaging and test results available.   Sterile Technique Maximal sterile technique including sterile barrier drape, hand hygiene, sterile gown, sterile gloves, mask, hair covering.    Procedure Description Appropriate anatomy identified by palpation.  Patient's neck prepped and draped in sterile fashion.  1% lidocaine with epinephrine was used to anesthetize skin overlying neck.  1.5cm incision made and blunt dissection performed until tracheal rings could be easily palpated.   Then a size 6-0 Shiley tracheostomy was placed under bronchoscopic visualization using usual Seldinger technique and serial dilation.   Bronchoscope confirmed placement above the carina.  Tracheostomy was sutured in place with adhesive pad to protect skin under pressure.    Patient connected to ventilator.   Complications/Tolerance None; patient tolerated the procedure well. Chest X-ray is ordered to confirm no post-procedural complication.   EBL Minimal   Specimen(s) None

## 2022-11-27 NOTE — Procedures (Signed)
Diagnostic Bronchoscopy  Cynthia Underwood  [id number]  1970/11/18  Date:11/27/22  Time:12:41 PM   Provider Performing:Elaiza Shoberg   Procedure: Diagnostic Bronchoscopy [id number])  Indication(s) Assist with direct visualization of tracheostomy placement and BAL to rule out infection  Consent Risks of the procedure as well as the alternatives and risks of each were explained to the patient and/or caregiver.  Consent for the procedure was obtained.   Anesthesia See separate tracheostomy note   Time Out Verified patient identification, verified procedure, site/side was marked, verified correct patient position, special equipment/implants available, medications/allergies/relevant history reviewed, required imaging and test results available.   Sterile Technique Usual hand hygiene, masks, gowns, and gloves were used   Procedure Description Bronchoscope advanced through endotracheal tube and into airway.  After suctioning out tracheal secretions, bronchoscope used to provide direct visualization of tracheostomy placement.  After the trach was secured the bronchoscope was advanced through tracheostomy. Thick mucus noted in bilateral lower lobes R > L. BAL was performed in the right lower lobe.   Complications/Tolerance None; patient tolerated the procedure well.   EBL None  Specimen(s) None  Cynthia Garfinkel MD [facility] See Amion for pager  If no response to pager , please call [phone] until 7pm After 7:00 pm call Elink  [phone] 11/27/2022, 12:43 PM

## 2022-11-28 ENCOUNTER — Other Ambulatory Visit (HOSPITAL_COMMUNITY): Payer: Self-pay

## 2022-11-28 DIAGNOSIS — J9601 Acute respiratory failure with hypoxia: Secondary | ICD-10-CM | POA: Diagnosis not present

## 2022-11-28 LAB — CBC
HCT: 28 % — ABNORMAL LOW (ref 36.0–46.0)
Hemoglobin: 9.3 g/dL — ABNORMAL LOW (ref 12.0–15.0)
MCH: 32 pg (ref 26.0–34.0)
MCHC: 33.2 g/dL (ref 30.0–36.0)
MCV: 96.2 fL (ref 80.0–100.0)
Platelets: 426 10*3/uL — ABNORMAL HIGH (ref 150–400)
RBC: 2.91 MIL/uL — ABNORMAL LOW (ref 3.87–5.11)
RDW: 16.5 % — ABNORMAL HIGH (ref 11.5–15.5)
WBC: 16.5 10*3/uL — ABNORMAL HIGH (ref 4.0–10.5)
nRBC: 0.4 % — ABNORMAL HIGH (ref 0.0–0.2)

## 2022-11-28 LAB — CULTURE, BLOOD (ROUTINE X 2)
Culture: NO GROWTH
Culture: NO GROWTH
Special Requests: ADEQUATE
Special Requests: ADEQUATE

## 2022-11-28 LAB — BASIC METABOLIC PANEL
Anion gap: 11 (ref 5–15)
BUN: 17 mg/dL (ref 6–20)
CO2: 22 mmol/L (ref 22–32)
Calcium: 8.2 mg/dL — ABNORMAL LOW (ref 8.9–10.3)
Chloride: 100 mmol/L (ref 98–111)
Creatinine, Ser: 0.69 mg/dL (ref 0.44–1.00)
GFR, Estimated: >60 mL/min (ref >60)
Glucose, Bld: 122 mg/dL — ABNORMAL HIGH (ref 70–99)
Potassium: 4.4 mmol/L (ref 3.5–5.1)
Sodium: 133 mmol/L — ABNORMAL LOW (ref 135–145)

## 2022-11-28 LAB — GLUCOSE, CAPILLARY
Glucose-Capillary: 114 mg/dL — ABNORMAL HIGH (ref 70–99)
Glucose-Capillary: 123 mg/dL — ABNORMAL HIGH (ref 70–99)
Glucose-Capillary: 128 mg/dL — ABNORMAL HIGH (ref 70–99)
Glucose-Capillary: 114 mg/dL — ABNORMAL HIGH (ref 70–99)
Glucose-Capillary: 145 mg/dL — ABNORMAL HIGH (ref 70–99)

## 2022-11-28 LAB — COOXEMETRY PANEL
Carboxyhemoglobin: 2 % — ABNORMAL HIGH (ref 0.5–1.5)
Methemoglobin: <0.7 % (ref 0.0–1.5)
O2 Saturation: 56.8 %
Total hemoglobin: 8.7 g/dL — ABNORMAL LOW (ref 12.0–16.0)

## 2022-11-28 LAB — MAGNESIUM: Magnesium: 2.2 mg/dL (ref 1.7–2.4)

## 2022-11-28 LAB — TROPONIN I (HIGH SENSITIVITY)
Troponin I (High Sensitivity): 8 ng/L (ref <18)
Troponin I (High Sensitivity): 10 ng/L (ref <18)

## 2022-11-28 LAB — PHOSPHORUS: Phosphorus: 2.1 mg/dL — ABNORMAL LOW (ref 2.5–4.6)

## 2022-11-28 MED ORDER — SODIUM CHLORIDE 0.9% FLUSH
10.0000 mL | Freq: Two times a day (BID) | INTRAVENOUS | Status: DC
  Administered 2022-11-28 – 2022-12-05 (×11): 10 mL

## 2022-11-28 MED ORDER — ALTEPLASE 2 MG IJ SOLR
2.0000 mg | Freq: Once | INTRAMUSCULAR | Status: AC
  Administered 2022-11-28: 2 mg
  Filled 2022-11-28: qty 2

## 2022-11-28 MED ORDER — OSMOLITE 1.5 CAL PO LIQD
1000.0000 mL | ORAL | Status: DC
  Administered 2022-11-28 – 2022-12-06 (×6): 1000 mL
  Filled 2022-11-28 (×11): qty 1000

## 2022-11-28 MED ORDER — SODIUM PHOSPHATES 45 MMOLE/15ML IV SOLN
15.0000 mmol | Freq: Once | INTRAVENOUS | Status: AC
  Administered 2022-11-28: 15 mmol via INTRAVENOUS
  Filled 2022-11-28: qty 5

## 2022-11-28 MED ORDER — SODIUM CHLORIDE 0.9% FLUSH
10.0000 mL | INTRAVENOUS | Status: DC | PRN
  Administered 2022-12-05: 10 mL

## 2022-11-28 MED ORDER — QUETIAPINE FUMARATE 25 MG PO TABS
25.0000 mg | ORAL_TABLET | Freq: Two times a day (BID) | ORAL | Status: DC | PRN
  Administered 2022-11-29 – 2022-12-01 (×5): 25 mg
  Filled 2022-11-28 (×5): qty 1

## 2022-11-28 MED ORDER — SPIRONOLACTONE 12.5 MG HALF TABLET
12.5000 mg | ORAL_TABLET | Freq: Every day | ORAL | Status: DC
  Administered 2022-11-28: 12.5 mg
  Filled 2022-11-28 (×2): qty 1

## 2022-11-28 MED FILL — Medication: Qty: 1 | Status: AC

## 2022-11-28 NOTE — Progress Notes (Addendum)
eLink Physician-Brief Progress Note Patient Name: Cynthia Underwood DOB: 1970-11-23 MRN: [id number]   Date of Service  11/28/2022  HPI/Events of Note  Notified of abnormal EKG.  PT was restless and had some chest pain for which EKG was done.    EKG shows sinus rhythm with 1st degree AV block, q waves in the inferior leads, non-specific ST-T wave changes. Pt refusing chest pain at this time.   eICU Interventions  Repeat EKG.  Get troponin. Send AM labs now.       Intervention Category Intermediate Interventions: Diagnostic test evaluation  [name] 11/28/2022, 3:18 AM  3:28 AM Repeat EKG is not consistent with STEMI.   Plan> Will follow up labs.

## 2022-11-28 NOTE — Progress Notes (Incomplete Revision)
NAME:  Cynthia Underwood, MRN:  [id number], DOB:  12-24-1970, LOS: 50 ADMISSION DATE:  11/11/2022 CONSULTATION DATE:  11/14/2022 REFERRING MD:  Wyline Copas - TRH CHIEF COMPLAINT:  Vfib arrest on floor   History of Present Illness:   52 year old woman who initially presented to [facility] 3/9 for cough and progressive dyspnea x 4 days. Also reported diarrhea and 1 episode of epigastric pain with vomiting. PMHx significant for RBBB/high-grade heart block (s/p PPM), hypothyroidism, sickle cell trait, myotonic dystrophy (dx age 17), tobacco use.  Noted to be hypoxic in ED with O2 sat 88% on RA, CTA Chest c/f LLL PNA with mild bronchiectasis, negative for PE. Also c/f esophagitis. Patient was empirically started on CAP coverage (ceftriaxone, azithromycin) and GERD treatment (PPI/sucralfate). Also found to be positive for human metapneumovirus. Admitted to [facility].  Patient was initially progressing as expected 3/11-3/12 until ~1400, sat was noted to be in 80s. Placed on 5L humidified oxygen with slight improvement in sats. Solumedrol and DuoNebs were ordered. Around 1900 patient's RN was informed by telemetry of VF and was found unresponsive and pulseless. Code Blue was called with CPR x 15 minutes and ROSC. Amiodarone, Epi and defib were administered. Patient was intubated by anesthesia peri-code. IO was placed and Levophed was initiated. Cardiology was consulted.  PCCM was consulted for ICU admission post-arrest.  Pertinent Medical History:  Heart block, Goiter, Sickle cell trait, Myotonic dystrophy  Significant Hospital Events: Including procedures, antibiotic start and stop dates in addition to other pertinent events   3/9 - Presented to [facility] for LLL PNA. Started on Surveyor, quantity. +human metapneumovirus. 3/12 - Hypoxic requiring 5LNC. Later VF arrest on floor, CPR 3/13 echocardiogram LVEF severely depressed 50% (decreased compared with 01/2021), severely reduced RV function 3/14 Cardiac catheterization showed  normal coronaries, new normal filling pressures and normal PA pressure.  With excellent cardiac output 3/15 Extubated.  3/16 Reintubated for increased work of breathing, failed BiPAP 3/18 alert, interactive off vasopressors 3/20 Wean, trial extubation today  3/21 No acute issues overnight, failed early am wean due to apnea on 50 Fentanyl and 0.8 Pecedex  3/24 PRBC transfusion 3/25 Percutaneous tracheostomy  Interim History / Subjective:   Underwent tracheostomy yesterday.  Bronchoscopy BAL done in the right lower lobe Had some chest pain overnight.  EKG and troponin done.  Objective:  Blood pressure (!) 84/53, pulse 69, temperature 99.5 F (37.5 C), temperature source Axillary, resp. rate (!) 25, height 5\' 3"  (1.6 m), weight 41.6 kg, last menstrual period 12/03/2016, SpO2 94 %.    Vent Mode: PSV;CPAP FiO2 (%):  [30 %-50 %] 40 % Set Rate:  [24 bmp] 24 bmp Vt Set:  [410 mL] 410 mL PEEP:  [5 cmH20] 5 cmH20 Pressure Support:  [12 cmH20] 12 cmH20 Plateau Pressure:  [22 cmH20-28 cmH20] 22 cmH20   Intake/Output Summary (Last 24 hours) at 11/28/2022 0903 Last data filed at 11/28/2022 0500 Gross per 24 hour  Intake 722.14 ml  Output 2350 ml  Net -1627.86 ml   Filed Weights   11/25/22 0154 11/26/22 0308 11/27/22 0500  Weight: 41.9 kg 41.9 kg 41.6 kg   Physical Examination: Gen:      No acute distress HEENT:  EOMI, sclera anicteric, trach Neck:     No masses; no thyromegaly Lungs:    Clear to auscultation bilaterally; normal respiratory effort CV:         Regular rate and rhythm; no murmurs Abd:      + bowel sounds; soft, non-tender; no palpable  masses, no distension Ext:    No edema; adequate peripheral perfusion Skin:      Warm and dry; no rash Neuro: Somnolent  Labs, imaging reviewed Significant for sodium 133, creatinine 0.69, Phos 2.1 WBC count increased to 16.5, platelets 426 No new imaging today High-sensitivity troponin 10  Resolved Hospital Problem List:  Septic  shock from pneumonia, Cardiogenic shock from cardiac arrest, Shock liver in setting of cardiac arrest  Assessment & Plan:  Post-Vfib arrest. Elevated troponin. Heart block s/p MDT PPM. New acute non ischemic systolic CHF with EF [id number]. CHF team following  Acute hypoxic respiratory failure from cardiac arrest, CHF, Metapneumovirus pneumonia, HCAP, muscle weakness from myotonic dystrophy. S/p tracheostomy on 3/25 Hospital-acquired pneumonia -SLP consult for PMV - Start pressure support weans as tolerated - Day 6 of cefepime, f/u blood culture from 3/21 and BAL cultures from [id number]   Acute metabolic encephalopathy from anoxia, sepsis. - RASS goal 0 to -1 - Started Seroquel PRN 3/25 for anxiety - Consult Psych as she likely has depression and anxitey   Hyperglycemia. SSI   GERD with esophagitis. Continue PPI   Hypothyroidism. Continue levothyroxine   Constipation. Bowel regimen  Anemia of iron deficiency, critical illness and chronic disease. - f/u CBC - transfuse for Hb < 7 -Given Ferrlecit on Sunday for iron supplementation  Hypophosphatemia. - continue replacement  Best Practice: (right click and "Reselect all SmartList Selections" daily)   Diet/type: tubefeeds DVT prophylaxis: Lovenox GI prophylaxis: PPI Lines: Central line Foley:  N/A Code Status:  full code Last date of multidisciplinary goals of care discussion [updated her mother at bedside]  Signature:   The patient is critically ill with multiple organ system failure and requires high complexity decision making for assessment and support, frequent evaluation and titration of therapies, advanced monitoring, review of radiographic studies and interpretation of complex data.   Critical Care Time devoted to patient care services, exclusive of separately billable procedures, described in this note is 35 minutes.   Marshell Garfinkel MD [facility] See Amion for pager  If no response to pager  , please call [phone] until 7pm After 7:00 pm call Elink  [phone] 11/28/2022, 9:03 AM

## 2022-11-28 NOTE — Progress Notes (Signed)
Pharmacy Electrolyte Replacement  Recent Labs:  Recent Labs    11/28/22 0355 11/28/22 0638  K 4.4  --   MG  --  2.2  PHOS  --  2.1*  CREATININE 0.69  --     Low Critical Values (K </= 2.5, Phos </= 1, Mg </= 1) Present: None  MD Contacted: Dr. Vaughan Browner  Plan: sodium phosphate 15 mmol IV x 1

## 2022-11-28 NOTE — TOC Benefit Eligibility Note (Signed)
Patient Teacher, English as a foreign language completed.    The patient is currently admitted and upon discharge could be taking Farxiga 10 mg.  The current 30 day co-pay is $0.00.   The patient is currently admitted and upon discharge could be taking Jardiance 10 mg.  The current 30 day co-pay is $0.00.   The patient is insured through [employer] Part D   This test claim was processed through [place] amounts may vary at other pharmacies due to pharmacy/plan contracts, or as the patient moves through the different stages of their insurance plan.  Lyndel Safe, [place] Patient Advocate Specialist [place] Patient Advocate Team Direct Number: [phone]  Fax: [phone]

## 2022-11-28 NOTE — Progress Notes (Signed)
eLink Physician-Brief Progress Note Patient Name: Cynthia Underwood DOB: 1971-06-05 MRN: [id number]   Date of Service  11/28/2022  HPI/Events of Note  Patient with frankly watery stools, normal platelet count, no obvious bleeding.  eICU Interventions  Flexiseal ordered.        Frederik Pear 11/28/2022, 11:33 PM

## 2022-11-28 NOTE — Progress Notes (Signed)
Nutrition Follow-up  DOCUMENTATION CODES:  Severe malnutrition in context of chronic illness, Underweight  INTERVENTION:  Continue tube feeding via cortrak tube. Adjust to the following: Osmolite 1.5 at 50 ml/h (1200 ml per day) Prosource TF20 60 ml 1x/d Provides 1880 kcal, 95 gm protein, 914 ml free water daily Banatrol BID   NUTRITION DIAGNOSIS:  Severe Malnutrition related to chronic illness (myotonic dystrophy) as evidenced by severe fat depletion, severe muscle depletion. -remains applicable  GOAL:  Patient will meet greater than or equal to 90% of their needs - being met with TF  MONITOR:  TF tolerance, I & O's, Vent status, Labs, Weight trends  REASON FOR ASSESSMENT:  Ventilator, Consult Enteral/tube feeding initiation and management  ASSESSMENT:  Pt with hx of myotonic dystrophy and hypothyroidism s/p thyroidectomy presented to ED with worsening cough and diarrhea for 4 days PTA. Found to have pneumonia related to metapneumovirus  3/12 - code blue called for VF arrest, intubated and transferred to ICU 3/14 - right/left heart cath and coronary angiography 3/15 - extubated 3/16 - intubated 3/25 - trach placed, bronch  Patient is currently intubated on ventilator support via newly placed trach. Discussed in rounds, pt tolerating TF. Noted a slight decrease in weight since admission. Will adjust TF formula and rate slightly to increase kcal provided.    MV: 8 L/min Temp (24hrs), Avg:99 F (37.2 C), Min:97.5 F (36.4 C), Max:99.6 F (37.6 C)   Intake/Output Summary (Last 24 hours) at 11/28/2022 1414 Last data filed at 11/28/2022 1236 Gross per 24 hour  Intake 284.21 ml  Output 2050 ml  Net -1765.79 ml  Net IO Since Admission: 5,446.67 mL [11/28/22 1414]  Nutritionally Relevant Medications: Scheduled Meds:  PROSource TF20  60 mL Per Tube Daily   BANATROL TF  60 mL Per Tube BID   furosemide  20 mg Per Tube Daily   insulin aspart  0-9 Units Subcutaneous Q4H    pantoprazole IV  40 mg Intravenous QHS   spironolactone  12.5 mg Per Tube Daily   sucralfate  1 g Per Tube BID   Continuous Infusions:  ceFEPime (MAXIPIME) IV 2 g (11/28/22 0950)   feeding supplement (VITAL 1.5 CAL) 45 mL/hr at 11/27/22 1800   sodium phosphate 15 mmol in D5 250 mL infusion     PRN Meds: diphenhydrAMINE, docusate, polyethylene glycol  Labs Reviewed: Na 133  Phosphorus 2.1 CBG ranges from 107-131 mg/dL over the last 24 hours  NUTRITION - FOCUSED PHYSICAL EXAM: Flowsheet Row Most Recent Value  Orbital Region Severe depletion  Upper Arm Region Severe depletion  Thoracic and Lumbar Region Severe depletion  Buccal Region Severe depletion  Temple Region Moderate depletion  Clavicle Bone Region Severe depletion  Clavicle and Acromion Bone Region Severe depletion  Scapular Bone Region Moderate depletion  Dorsal Hand Unable to assess  [mittens]  Patellar Region Severe depletion  Anterior Thigh Region Severe depletion  Posterior Calf Region Severe depletion  Edema (RD Assessment) None  Hair Reviewed  Eyes Reviewed  Mouth Reviewed  Skin Reviewed  Nails Unable to assess       Diet Order:   Diet Order             Diet NPO time specified  Diet effective now                   EDUCATION NEEDS:  Not appropriate for education at this time  Skin:  Skin Assessment: Reviewed RN Assessment  Last BM:  3/24  Height:  Ht Readings from Last 1 Encounters:  11/11/22 5\' 3"  (1.6 m)    Weight:  Wt Readings from Last 1 Encounters:  11/27/22 41.6 kg    Ideal Body Weight:  52.3 kg  BMI:  Body mass index is 16.25 kg/m.  Estimated Nutritional Needs:  Kcal:  1600-1800 kcal/d Protein:  80-95 g/d Fluid:  >/=1.8L/d    Ranell Patrick, RD, LDN Clinical Dietitian RD pager # available in [facility]  After hours/weekend pager # available in [facility]

## 2022-11-28 NOTE — Progress Notes (Signed)
Advanced Heart Failure Rounding Note  PCP-Cardiologist: Fransico Him, MD   Subjective:   Cath 3/14 c/w nonischemic cardiomyopathy, filling pressures minimally elevated on RHC. Excellent cardiac output by Fick. Extubated 3/15. Reintubated (3/16). Now s/p trach 3/25  Remains a-paced at 3. No further VT/VF/torsades  Apparently was restless and had some chest pain overnight, EKG showed NSR. HsTrop 8.   PO lasix started yesterday, -2.5 L UOP. Weight down 1 lb  Stable on trach/vent.   Objective:   Weight Range: 41.6 kg Body mass index is 16.25 kg/m.   Vital Signs:   Temp:  [97.5 F (36.4 C)-99.6 F (37.6 C)] 99.5 F (37.5 C) (03/26 0804) Pulse Rate:  [69-134] 74 (03/26 0749) Resp:  [11-27] 24 (03/26 0749) BP: (77-134)/(49-93) 98/62 (03/26 0600) SpO2:  [89 %-100 %] 94 % (03/26 0753) FiO2 (%):  [30 %-50 %] 40 % (03/26 0753) Last BM Date : 11/26/22  Weight change: Filed Weights   11/25/22 0154 11/26/22 0308 11/27/22 0500  Weight: 41.9 kg 41.9 kg 41.6 kg   Intake/Output:   Intake/Output Summary (Last 24 hours) at 11/28/2022 0818 Last data filed at 11/28/2022 0500 Gross per 24 hour  Intake 802.75 ml  Output 2350 ml  Net -1547.25 ml   Physical Exam  CVP 6 General:  ill appearing.   HEENT: +cortrak, +trach Neck: supple. JVD ~6 cm. Carotids 2+ bilat; no bruits. No lymphadenopathy or thyromegaly appreciated. Cor: PMI nondisplaced. Regular rate & rhythm. No rubs, gallops or murmurs. Lungs: coarse Abdomen: soft, nontender, nondistended. No hepatosplenomegaly. No bruits or masses. Good bowel sounds. GU: +foley Extremities: no cyanosis, clubbing, rash, edema. 3L LIJ CVC Neuro: nods appropriately on trach/vent  Telemetry   A paced 70 No VT/VF/torsades (Personally reviewed)    Labs    CBC Recent Labs    11/27/22 0313 11/28/22 0355  WBC 13.4* 16.5*  HGB 9.4* 9.3*  HCT 29.0* 28.0*  MCV 95.7 96.2  PLT 406* 123XX123*   Basic Metabolic Panel Recent Labs     11/26/22 0318 11/27/22 0313 11/28/22 0355  NA 134* 134* 133*  K 4.3 4.2 4.4  CL 101 101 100  CO2 21* 22 22  GLUCOSE 119* 120* 122*  BUN 13 13 17   CREATININE 0.54 0.57 0.69  CALCIUM 8.4* 8.0* 8.2*  MG 2.0 1.9  --   PHOS 2.0* 1.9*  --    Liver Function Tests No results for input(s): "AST", "ALT", "ALKPHOS", "BILITOT", "PROT", "ALBUMIN" in the last 72 hours.  No results for input(s): "LIPASE", "AMYLASE" in the last 72 hours. Cardiac Enzymes No results for input(s): "CKTOTAL", "CKMB", "CKMBINDEX", "TROPONINI" in the last 72 hours.  BNP: BNP (last 3 results) Recent Labs    11/11/22 1238 11/17/22 2150  BNP 58.5 145.2*   ProBNP (last 3 results) No results for input(s): "PROBNP" in the last 8760 hours.  D-Dimer No results for input(s): "DDIMER" in the last 72 hours. Hemoglobin A1C No results for input(s): "HGBA1C" in the last 72 hours.  Fasting Lipid Panel No results for input(s): "CHOL", "HDL", "LDLCALC", "TRIG", "CHOLHDL", "LDLDIRECT" in the last 72 hours. Thyroid Function Tests No results for input(s): "TSH", "T4TOTAL", "T3FREE", "THYROIDAB" in the last 72 hours.  Invalid input(s): "FREET3"  Other results:  Imaging   DG Chest Port 1 View  Result Date: 11/27/2022 CLINICAL DATA:  Status post tracheostomy. EXAM: PORTABLE CHEST 1 VIEW COMPARISON:  Same day. FINDINGS: Interval placement of tracheostomy in grossly good position. Distal tip of feeding tube is noted  in proximal stomach. Left-sided pacemaker is unchanged. Right middle lobe atelectasis or infiltrate is noted. Bony thorax is unremarkable. IMPRESSION: Tracheostomy in grossly good position. Increased right middle lobe opacity concerning for worsening atelectasis or infiltrate. Electronically Signed   By: Marijo Conception M.D.   On: 11/27/2022 12:51   DG CHEST PORT 1 VIEW  Result Date: 11/27/2022 CLINICAL DATA:  Shortness of breath EXAM: PORTABLE CHEST 1 VIEW COMPARISON:  CXR 11/24/22 FINDINGS: Endotracheal tube  terminates approximately 1 cm above the carina, consider slight retraction. Weighted enteric tube terminates in the stomach. Left-sided dual lead cardiac device with unchanged lead positioning. Left-sided central venous catheter with tip near the cavoatrial junction. Surgical clips in the region of the thyroid. No pleural effusion. No pneumothorax. Linear right basilar airspace opacities, favored to represent atelectasis. No radiographically apparent displaced rib fractures. Visualized upper abdomen is unremarkable IMPRESSION: 1. Endotracheal tube terminates approximately 1 cm above the carina, consider slight retraction. Additional support apparatus as above. 2. Linear right basilar airspace opacities, favored to represent atelectasis. Electronically Signed   By: Marin Roberts M.D.   On: 11/27/2022 09:44    Medications:     Scheduled Medications:  Chlorhexidine Gluconate Cloth  6 each Topical Daily   enoxaparin (LOVENOX) injection  30 mg Subcutaneous Q24H   feeding supplement (PROSource TF20)  60 mL Per Tube Daily   fentaNYL (SUBLIMAZE) injection  200 mcg Intravenous Once   fiber supplement (BANATROL TF)  60 mL Per Tube BID   furosemide  20 mg Per Tube Daily   insulin aspart  0-9 Units Subcutaneous Q4H   levothyroxine  112 mcg Per Tube QAC breakfast   mouth rinse  15 mL Mouth Rinse Q2H   oxyCODONE  5 mg Per Tube Q6H   pantoprazole (PROTONIX) IV  40 mg Intravenous QHS   QUEtiapine  25 mg Per Tube BID   sodium chloride flush  10-40 mL Intracatheter Q12H   sodium chloride flush  3 mL Intravenous Q12H   spironolactone  12.5 mg Per Tube Daily   sucralfate  1 g Per Tube BID    Infusions:  sodium chloride 10 mL/hr at 11/27/22 1800   ceFEPime (MAXIPIME) IV 2 g (11/27/22 2133)   dexmedetomidine (PRECEDEX) IV infusion 1.8 mcg/kg/hr (11/28/22 0158)   feeding supplement (VITAL 1.5 CAL) 45 mL/hr at 11/27/22 1800   fentaNYL infusion INTRAVENOUS 25 mcg/hr (11/27/22 1800)    PRN Medications: sodium  chloride, acetaminophen, diphenhydrAMINE, docusate, fentaNYL, guaiFENesin, ipratropium-albuterol, lidocaine, lip balm, melatonin, midazolam, mouth rinse, phenylephrine, polyethylene glycol, sodium chloride flush, sodium chloride flush  Patient Profile   52 y.o. female with a hx of tobacco use, COPD, myotonic dystrophy type I since age 65, high grade heart block s/p PPM, RBBB, sickle cell trait, and DM1 (questionable diagnosis) and strong family history of heart failure (father s/p cardiac transplant and paternal aunt w/ NICM w/ LVAD), admitted w/ acute hypoxic respiratory failure/ LLL CAP secondary to metapneumovirus. Hospital course c/b VF arrest. Post arrest echo w/ severe BiV failure, LVEF < 20%, RV severely reduced + diffuse HK   Assessment/Plan  1. VF arrest: 3/12, shocked. 15 minutes CPR total.  - Due to long-short interval (R-on-T) in the setting of QT-prolonging drugs (azithro) and hypokalemia - Pacemaker lower rate limit increased to 90 to prevent pause-dependent TdP; now down to 70 - Per EP: may require upgrade with LV lead +/-upgrade to primary prevention ICD if EF does not recover with GDMT. VT this admission was attributable to  a reversible cause, so ICD not indicated urgently for secondary prevention. Can consider placement of LifeVest on discharge but given stability of rhythm and need for chronic trach care would likely defer. - No arrhythmias on tele - Keep K> 4.0 Mg > 2.0 - avoid QT prolonging agents  2. Acute hypoxemic respiratory failure: Intubated post-VF arrest.  Had viral PNA with metapneumovirus at presentation.  - extubated 3/14 Reintubated 3/16 - Failed multiple SBTs - Trached and bronch 3/25 - CCM managing.   3. Acute systolic CHF: Prior workup for cardiac sarcoidosis. Cardiac MRI in 2017 showed subendocardial LGE in the mid inferior and inferolateral walls with EF 56%.  Cardiac PET done after this was NOT suggestive of cardiac sarcoidosis.  - Echo in 5/22 with normal  EF.   - Echo this admission reviewed, EF 20-25% range with diffuse hypokinesis but relatively preserved function at the apex, moderate RV dysfunction.  - cath 3/15 no CAD - suspect due to myotonic dystrophy type 1 -> she has an aunt with LVAD who has cardiomyopathy due to myotonic dystrophy.  Father had a heart transplant, uncertain of underlying condition. R/LHC c/w Nonischemic cardiomyopathy.  - CVP 6. Continue 20 PO lasix daily through today, can probably hold tomorrow - Continue spiro 12.5 mg daily - SGLT2i when foley removed - Off pressors. No room for GDMT with low BP (SBP 90s) - Given overall debility, she is not currently a candidate for advanced therapies - Would consider palliative care input    4. High grade heart block: Has MDT PPM with His bundle lead.  This is likely due to cardiac involvement of myotonic dystrophy type 1.  - plan as above  5. Myotonic dystrophy: Type 1, diagnosed at age 13. Has family members with the same diagnosis.  Followed by neurology.   6. Tobacco abuse - needs cessation  7. Hypokalemia/hypomag - supp as needed  8. Anemia of chronic illness - hgb 7.0 -> 9.4-> 9.3. s/p 1uPRBC  Length of Stay: [place], NP  11/28/2022, 8:18 AM  Advanced Heart Failure Team Pager [phone] (M-F; 7a - 5p)  Please contact [facility] for night-coverage after hours (5p -7a ) and weekends on [email or web address]

## 2022-11-28 NOTE — Progress Notes (Signed)
NAME:  Cynthia Underwood, MRN:  [id number], DOB:  05/03/1971, LOS: 53 ADMISSION DATE:  11/11/2022 CONSULTATION DATE:  11/14/2022 REFERRING MD:  Wyline Copas - [facility] CHIEF COMPLAINT:  Vfib arrest on floor   History of Present Illness:   52 year old woman who initially presented to [facility] 3/9 for cough and progressive dyspnea x 4 days. Also reported diarrhea and 1 episode of epigastric pain with vomiting. PMHx significant for RBBB/high-grade heart block (s/p PPM), hypothyroidism, sickle cell trait, myotonic dystrophy (dx age 13), tobacco use.  Noted to be hypoxic in ED with O2 sat 88% on RA, CTA Chest c/f LLL PNA with mild bronchiectasis, negative for PE. Also c/f esophagitis. Patient was empirically started on CAP coverage (ceftriaxone, azithromycin) and GERD treatment (PPI/sucralfate). Also found to be positive for human metapneumovirus. Admitted to [facility].  Patient was initially progressing as expected 3/11-3/12 until ~1400, sat was noted to be in 80s. Placed on 5L humidified oxygen with slight improvement in sats. Solumedrol and DuoNebs were ordered. Around 1900 patient's RN was informed by telemetry of VF and was found unresponsive and pulseless. Code Blue was called with CPR x 15 minutes and ROSC. Amiodarone, Epi and defib were administered. Patient was intubated by anesthesia peri-code. IO was placed and Levophed was initiated. Cardiology was consulted.  PCCM was consulted for ICU admission post-arrest.  Pertinent Medical History:  Heart block, Goiter, Sickle cell trait, Myotonic dystrophy  Significant Hospital Events: Including procedures, antibiotic start and stop dates in addition to other pertinent events   3/9 - Presented to [facility] for LLL PNA. Started on Surveyor, quantity. +human metapneumovirus. 3/12 - Hypoxic requiring 5LNC. Later VF arrest on floor, CPR 3/13 echocardiogram LVEF severely depressed 50% (decreased compared with 01/2021), severely reduced RV function 3/14 Cardiac catheterization showed  normal coronaries, new normal filling pressures and normal PA pressure.  With excellent cardiac output 3/15 Extubated.  3/16 Reintubated for increased work of breathing, failed BiPAP 3/18 alert, interactive off vasopressors 3/20 Wean, trial extubation today  3/21 No acute issues overnight, failed early am wean due to apnea on 50 Fentanyl and 0.8 Pecedex  3/24 PRBC transfusion 3/25 Percutaneous tracheostomy  Interim History / Subjective:   Underwent tracheostomy yesterday.  Bronchoscopy BAL done in the right lower lobe Had some chest pain overnight.  EKG and troponin done.  Objective:  Blood pressure (!) 84/53, pulse 69, temperature 99.5 F (37.5 C), temperature source Axillary, resp. rate (!) 25, height 5\' 3"  (1.6 m), weight 41.6 kg, last menstrual period 12/03/2016, SpO2 94 %.    Vent Mode: PSV;CPAP FiO2 (%):  [30 %-50 %] 40 % Set Rate:  [24 bmp] 24 bmp Vt Set:  [410 mL] 410 mL PEEP:  [5 cmH20] 5 cmH20 Pressure Support:  [12 cmH20] 12 cmH20 Plateau Pressure:  [22 cmH20-28 cmH20] 22 cmH20   Intake/Output Summary (Last 24 hours) at 11/28/2022 0903 Last data filed at 11/28/2022 0500 Gross per 24 hour  Intake 722.14 ml  Output 2350 ml  Net -1627.86 ml   Filed Weights   11/25/22 0154 11/26/22 0308 11/27/22 0500  Weight: 41.9 kg 41.9 kg 41.6 kg   Physical Examination: Gen:      No acute distress HEENT:  EOMI, sclera anicteric, trach Neck:     No masses; no thyromegaly Lungs:    Clear to auscultation bilaterally; normal respiratory effort CV:         Regular rate and rhythm; no murmurs Abd:      + bowel sounds; soft, non-tender; no palpable  masses, no distension Ext:    No edema; adequate peripheral perfusion Skin:      Warm and dry; no rash Neuro: Somnolent  Labs, imaging reviewed Significant for sodium 133, creatinine 0.69, Phos 2.1 WBC count increased to 16.5, platelets 426 No new imaging today High-sensitivity troponin 10  Resolved Hospital Problem List:  Septic  shock from pneumonia, Cardiogenic shock from cardiac arrest, Shock liver in setting of cardiac arrest  Assessment & Plan:   Post-Vfib arrest. Elevated troponin. Heart block s/p MDT PPM. New acute non ischemic systolic CHF with EF [id number]. CHF team following  Acute hypoxic respiratory failure from cardiac arrest, CHF, Metapneumovirus pneumonia, HCAP, muscle weakness from myotonic dystrophy. S/p tracheostomy on 3/25 Hospital-acquired pneumonia -SLP consult for PMV - Start pressure support weans as tolerated - day 5 of cefepime, f/u blood culture from 3/21 and BAL cultures from [id number]   Acute metabolic encephalopathy from anoxia, sepsis. - RASS goal 0 to -1 - Started Seroquel 3/25 for anxiety   Hyperglycemia. - SSI   GERD with esophagitis. -- Continue PPI   Hypothyroidism. - Continue levothyroxine   Constipation. -- Bowel regimen  Anemia of iron deficiency, critical illness and chronic disease. - f/u CBC - transfuse for Hb < 7 -Given Ferrlecit on Sunday for iron supplementation  Hypophosphatemia. - continue replacement  Best Practice: (right click and "Reselect all SmartList Selections" daily)   Diet/type: tubefeeds DVT prophylaxis: Lovenox GI prophylaxis: PPI Lines: Central line Foley:  N/A Code Status:  full code Last date of multidisciplinary goals of care discussion [updated her mother at bedside]  Signature:   The patient is critically ill with multiple organ system failure and requires high complexity decision making for assessment and support, frequent evaluation and titration of therapies, advanced monitoring, review of radiographic studies and interpretation of complex data.   Critical Care Time devoted to patient care services, exclusive of separately billable procedures, described in this note is 35 minutes.   Marshell Garfinkel MD [facility] See Amion for pager  If no response to pager , please call [phone] until 7pm After 7:00  pm call Elink  [phone] 11/28/2022, 9:03 AM

## 2022-11-28 NOTE — Evaluation (Signed)
Occupational Therapy Evaluation Patient Details Name: Cynthia Underwood MRN: [id number] DOB: June 04, 1971 Today's Date: 11/28/2022   History of Present Illness 52 y.o. admitted 3/9 with Left lower lobe community-acquired pneumonia, Acute hypoxic respiratory failure.   Pt with Vfib arrest on 3/12 with CPR and VDRF wtih transfer to ICU. Extubated 3/15 and re-intubated 3/16. s/p trach 3/25. PMH includes hypothyroidism, RBBB/high-grade heart block (s/p PPM),  sickle cell trait, myotonic dystrophy (dx age 62), tobacco use   Clinical Impression   Cynthia Underwood was evaluated s/p the above admission list. Per chart, he is indep at baseline. Upon evaluation she was limited by generalized weakness, poor activity tolerance, low LOA, impiared communication and command following and soft BP. Overall she is dependent for self care at bed level and needed mod A +2 for bed mobility. Once sitting EOB pt reported dizziness with low BP and MAP of 56. Pt will benefit from continued acute OT services. Pt will benefit from intensive inpatient follow up therapy, >3 hours/day after discharge. .       Recommendations for follow up therapy are one component of a multi-disciplinary discharge planning process, led by the attending physician.  Recommendations may be updated based on patient status, additional functional criteria and insurance authorization.   Assistance Recommended at Discharge Frequent or constant Supervision/Assistance  Patient can return home with the following A lot of help with walking and/or transfers;Two people to help with walking and/or transfers;A lot of help with bathing/dressing/bathroom;Two people to help with bathing/dressing/bathroom;Direct supervision/assist for financial management;Direct supervision/assist for medications management;Assist for transportation;Help with stairs or ramp for entrance;Assistance with cooking/housework;Assistance with feeding    Functional Status Assessment  Patient  has had a recent decline in their functional status and demonstrates the ability to make significant improvements in function in a reasonable and predictable amount of time.  Equipment Recommendations  Other (comment) (pending progress)    Recommendations for Other Services Rehab consult     Precautions / Restrictions Precautions Precautions: Fall Precaution Comments: trach (PEEP 5, 40%), bilat mitts as pt reaching for trach Restrictions Weight Bearing Restrictions: No      Mobility Bed Mobility Overal bed mobility: Needs Assistance Bed Mobility: Supine to Sit, Sit to Supine     Supine to sit: Mod assist, +2 for physical assistance Sit to supine: +2 for physical assistance, Max assist   General bed mobility comments: assist +2 for trunk and LE management, scooting to/from EOB. Coughing at EOB, tolerated EOB sitting x5 minutes before fatiguing and BP drop (see note)    Transfers Overall transfer level: Needs assistance                 General transfer comment: unable to attempt given symptomatic hypotension      Balance Overall balance assessment: Needs assistance Sitting-balance support: No upper extremity supported, Feet supported Sitting balance-Leahy Scale: Fair Sitting balance - Comments: EOB sitting tolerance x5 minutes             ADL either performed or assessed with clinical judgement   ADL Overall ADL's : Needs assistance/impaired Eating/Feeding: NPO             General ADL Comments: dependent for self care     Vision Baseline Vision/History: 0 No visual deficits Vision Assessment?: No apparent visual deficits Additional Comments: did not formally assess, seemingly [facility]     Perception Perception Perception Tested?: No   Praxis Praxis Praxis tested?: Not tested    Pertinent Vitals/Pain Pain Assessment Pain Assessment: Faces Faces  Pain Scale: Hurts little more Pain Location: chest, with coughing Pain Descriptors / Indicators: Sore,  Discomfort, Grimacing Pain Intervention(s): Limited activity within patient's tolerance, Monitored during session     Hand Dominance Right   Extremity/Trunk Assessment Upper Extremity Assessment Upper Extremity Assessment: Generalized weakness   Lower Extremity Assessment Lower Extremity Assessment: Defer to PT evaluation   Cervical / Trunk Assessment Cervical / Trunk Assessment: Kyphotic   Communication Communication Communication: No difficulties   Cognition Arousal/Alertness: Awake/alert Behavior During Therapy: WFL for tasks assessed/performed Overall Cognitive Status: Difficult to assess                 General Comments: follows one-step commands, communicates needs with gesturing and mouthing (feels dizzy, needs to lay down; chest discomfort with coughing)     General Comments  vent via trach 40%/PEEP 5, RR to 40 breaths/min EOB given coughing bout, RN aware            Home Living Family/patient expects to be discharged to:: Private residence Living Arrangements: Other relatives Available Help at Discharge: Family;Available PRN/intermittently Type of Home: House Home Access: Level entry     Home Layout: Bed/bath upstairs;Two level Alternate Level Stairs-Number of Steps: flight   Bathroom Shower/Tub: Tub/shower unit         Home Equipment: None          Prior Functioning/Environment Prior Level of Function : Independent/Modified Independent             Mobility Comments: ind ADLs Comments: ind        OT Problem List: Decreased strength;Decreased range of motion;Decreased activity tolerance;Impaired balance (sitting and/or standing);Decreased safety awareness;Decreased knowledge of precautions      OT Treatment/Interventions: Self-care/ADL training;Therapeutic exercise;DME and/or AE instruction;Energy conservation;Therapeutic activities;Patient/family education;Balance training    OT Goals(Current goals can be found in the care plan  section) Acute Rehab OT Goals Patient Stated Goal: unable to state OT Goal Formulation: With patient Time For Goal Achievement: 12/12/22 Potential to Achieve Goals: Good ADL Goals Pt Will Perform Grooming: with mod assist;sitting Pt Will Perform Upper Body Dressing: with mod assist;sitting Pt Will Perform Lower Body Dressing: with max assist;sit to/from stand Additional ADL Goal #1: Pt will complete bed mobility with min A as a precursor to ADLs  OT Frequency: Min 2X/week    Co-evaluation PT/OT/SLP Co-Evaluation/Treatment: Yes Reason for Co-Treatment: Complexity of the patient's impairments (multi-system involvement);For patient/therapist safety;To address functional/ADL transfers PT goals addressed during session: Mobility/safety with mobility;Balance OT goals addressed during session: ADL's and self-care      AM-PAC OT "6 Clicks" Daily Activity     Outcome Measure Help from another person eating meals?: Total Help from another person taking care of personal grooming?: Total Help from another person toileting, which includes using toliet, bedpan, or urinal?: Total Help from another person bathing (including washing, rinsing, drying)?: Total Help from another person to put on and taking off regular upper body clothing?: Total Help from another person to put on and taking off regular lower body clothing?: Total 6 Click Score: 6   End of Session Nurse Communication: Mobility status  Activity Tolerance: Patient tolerated treatment well Patient left: in bed;with call bell/phone within reach;with bed alarm set  OT Visit Diagnosis: Unsteadiness on feet (R26.81);Other abnormalities of gait and mobility (R26.89);Muscle weakness (generalized) (M62.81)                Time: [id number] OT Time Calculation (min): 19 min Charges:  OT General Charges $OT Visit: 1 Visit  OT Evaluation $OT Eval Moderate Complexity: 1 Mod  Shade Flood, OTR/L [place] [facility]  [place] Communication Preferred   Elliot Cousin 11/28/2022, 12:53 PM

## 2022-11-28 NOTE — Progress Notes (Signed)
Physical Therapy Treatment Patient Details Name: Cynthia Underwood MRN: [id number] DOB: 1971-08-28 Today's Date: 11/28/2022   History of Present Illness 52 y.o. admitted 3/9 with Left lower lobe community-acquired pneumonia, Acute hypoxic respiratory failure.   Pt with Vfib arrest on 3/12 with CPR and VDRF wtih transfer to ICU. Extubated 3/15 and re-intubated 3/16. s/p trach 3/25. PMH includes hypothyroidism, RBBB/high-grade heart block (s/p PPM),  sickle cell trait, myotonic dystrophy (dx age 19), tobacco use    PT Comments    Pt restless with mitts donned upon arrival to room, pt initially resistant but then agreeable to sitting EOB. Pt tolerated EOB sitting x5 minutes, limited by symptomatic hypotension and fatigue. Pt overall requiring mod-max +2 for bed mobility tasks at this time, PT to continue to progress pt as tolerated.   BP supine: 92/66(76) BP sitting: 82/43(56) BP return to supine: 93/53(66)    Recommendations for follow up therapy are one component of a multi-disciplinary discharge planning process, led by the attending physician.  Recommendations may be updated based on patient status, additional functional criteria and insurance authorization.  Follow Up Recommendations       Assistance Recommended at Discharge Frequent or constant Supervision/Assistance  Patient can return home with the following A lot of help with walking and/or transfers;A lot of help with bathing/dressing/bathroom;Assistance with cooking/housework;Assist for transportation;Help with stairs or ramp for entrance   Equipment Recommendations  Other (comment) (tbd)    Recommendations for Other Services Rehab consult     Precautions / Restrictions Precautions Precautions: Fall Precaution Comments: trach (PEEP 5, 40%), bilat mitts as pt reaching for trach Restrictions Weight Bearing Restrictions: No     Mobility  Bed Mobility Overal bed mobility: Needs Assistance Bed Mobility: Supine to Sit,  Sit to Supine     Supine to sit: Mod assist, +2 for physical assistance Sit to supine: +2 for physical assistance, Max assist   General bed mobility comments: assist +2 for trunk and LE management, scooting to/from EOB. Coughing at EOB, tolerated EOB sitting x5 minutes before fatiguing and BP drop (see note)    Transfers                   General transfer comment: unable to attempt given symptomatic hypotension    Ambulation/Gait                   Stairs             Wheelchair Mobility    Modified Rankin (Stroke Patients Only)       Balance Overall balance assessment: Needs assistance Sitting-balance support: No upper extremity supported, Feet supported Sitting balance-Leahy Scale: Fair Sitting balance - Comments: EOB sitting tolerance x5 minutes                                    Cognition Arousal/Alertness: Awake/alert Behavior During Therapy: WFL for tasks assessed/performed Overall Cognitive Status: Difficult to assess                                 General Comments: follows one-step commands, communicates needs with gesturing and mouthing (feels dizzy, needs to lay down; chest discomfort with coughing)        Exercises      General Comments General comments (skin integrity, edema, etc.): vent via trach 40%/PEEP 5, RR to 40 breaths/min EOB  given coughing bout, RN aware      Pertinent Vitals/Pain Pain Assessment Pain Assessment: Faces Faces Pain Scale: Hurts little more Pain Location: chest, with coughing Pain Descriptors / Indicators: Sore, Discomfort, Grimacing Pain Intervention(s): Limited activity within patient's tolerance, Monitored during session, Repositioned    Home Living                          Prior Function            PT Goals (current goals can now be found in the care plan section) Acute Rehab PT Goals Patient Stated Goal: get well PT Goal Formulation: With  patient Time For Goal Achievement: 12/07/22 Potential to Achieve Goals: Good Progress towards PT goals: Progressing toward goals    Frequency    Min 3X/week      PT Plan Current plan remains appropriate    Co-evaluation PT/OT/SLP Co-Evaluation/Treatment: Yes Reason for Co-Treatment: For patient/therapist safety;To address functional/ADL transfers;Complexity of the patient's impairments (multi-system involvement) PT goals addressed during session: Mobility/safety with mobility;Balance        AM-PAC PT "6 Clicks" Mobility   Outcome Measure  Help needed turning from your back to your side while in a flat bed without using bedrails?: A Lot Help needed moving from lying on your back to sitting on the side of a flat bed without using bedrails?: A Lot Help needed moving to and from a bed to a chair (including a wheelchair)?: Total Help needed standing up from a chair using your arms (e.g., wheelchair or bedside chair)?: Total Help needed to walk in hospital room?: Total Help needed climbing 3-5 steps with a railing? : Total 6 Click Score: 8    End of Session Equipment Utilized During Treatment: Other (comment) (trach) Activity Tolerance: Patient limited by fatigue Patient left: in bed;with call bell/phone within reach;with bed alarm set;Other (comment) (mitts donned) Nurse Communication: Mobility status PT Visit Diagnosis: Unsteadiness on feet (R26.81);Other abnormalities of gait and mobility (R26.89);Muscle weakness (generalized) (M62.81)     Time: [id number] PT Time Calculation (min) (ACUTE ONLY): 19 min  Charges:  $Therapeutic Activity: 8-22 mins                     Stacie Glaze, PT DPT Acute Rehabilitation Services Pager [phone]  Office [phone]    [place] Ruffin Pyo 11/28/2022, 12:00 PM

## 2022-11-29 DIAGNOSIS — J9601 Acute respiratory failure with hypoxia: Secondary | ICD-10-CM | POA: Diagnosis not present

## 2022-11-29 LAB — CBC WITH DIFFERENTIAL/PLATELET
Abs Immature Granulocytes: 0.08 10*3/uL — ABNORMAL HIGH (ref 0.00–0.07)
Basophils Absolute: 0 10*3/uL (ref 0.0–0.1)
Basophils Relative: 0 %
Eosinophils Absolute: 0.1 10*3/uL (ref 0.0–0.5)
Eosinophils Relative: 1 %
HCT: 26.7 % — ABNORMAL LOW (ref 36.0–46.0)
Hemoglobin: 8.8 g/dL — ABNORMAL LOW (ref 12.0–15.0)
Immature Granulocytes: 1 %
Lymphocytes Relative: 13 %
Lymphs Abs: 1.6 10*3/uL (ref 0.7–4.0)
MCH: 31.8 pg (ref 26.0–34.0)
MCHC: 33 g/dL (ref 30.0–36.0)
MCV: 96.4 fL (ref 80.0–100.0)
Monocytes Absolute: 0.8 10*3/uL (ref 0.1–1.0)
Monocytes Relative: 7 %
Neutro Abs: 9.1 10*3/uL — ABNORMAL HIGH (ref 1.7–7.7)
Neutrophils Relative %: 78 %
Platelets: 446 10*3/uL — ABNORMAL HIGH (ref 150–400)
RBC: 2.77 MIL/uL — ABNORMAL LOW (ref 3.87–5.11)
RDW: 16.2 % — ABNORMAL HIGH (ref 11.5–15.5)
WBC: 11.7 10*3/uL — ABNORMAL HIGH (ref 4.0–10.5)
nRBC: 0.2 % (ref 0.0–0.2)

## 2022-11-29 LAB — BASIC METABOLIC PANEL
Anion gap: 9 (ref 5–15)
BUN: 15 mg/dL (ref 6–20)
CO2: 28 mmol/L (ref 22–32)
Calcium: 8.5 mg/dL — ABNORMAL LOW (ref 8.9–10.3)
Chloride: 97 mmol/L — ABNORMAL LOW (ref 98–111)
Creatinine, Ser: 0.48 mg/dL (ref 0.44–1.00)
GFR, Estimated: >60 mL/min (ref >60)
Glucose, Bld: 131 mg/dL — ABNORMAL HIGH (ref 70–99)
Potassium: 4.2 mmol/L (ref 3.5–5.1)
Sodium: 134 mmol/L — ABNORMAL LOW (ref 135–145)

## 2022-11-29 LAB — PHOSPHORUS
Phosphorus: 1.4 mg/dL — ABNORMAL LOW (ref 2.5–4.6)
Phosphorus: 2 mg/dL — ABNORMAL LOW (ref 2.5–4.6)

## 2022-11-29 LAB — CULTURE, RESPIRATORY W GRAM STAIN: Culture: NORMAL

## 2022-11-29 LAB — GLUCOSE, CAPILLARY
Glucose-Capillary: 121 mg/dL — ABNORMAL HIGH (ref 70–99)
Glucose-Capillary: 122 mg/dL — ABNORMAL HIGH (ref 70–99)
Glucose-Capillary: 132 mg/dL — ABNORMAL HIGH (ref 70–99)
Glucose-Capillary: 136 mg/dL — ABNORMAL HIGH (ref 70–99)
Glucose-Capillary: 138 mg/dL — ABNORMAL HIGH (ref 70–99)
Glucose-Capillary: 138 mg/dL — ABNORMAL HIGH (ref 70–99)
Glucose-Capillary: 144 mg/dL — ABNORMAL HIGH (ref 70–99)

## 2022-11-29 MED ORDER — SODIUM BICARBONATE 650 MG PO TABS
650.0000 mg | ORAL_TABLET | Freq: Once | ORAL | Status: AC
  Administered 2022-11-29: 650 mg
  Filled 2022-11-29: qty 1

## 2022-11-29 MED ORDER — BANATROL TF EN LIQD
60.0000 mL | Freq: Three times a day (TID) | ENTERAL | Status: DC
  Administered 2022-11-29 – 2022-12-06 (×17): 60 mL
  Filled 2022-11-29 (×17): qty 60

## 2022-11-29 MED ORDER — PANCRELIPASE (LIP-PROT-AMYL) 10440-39150 UNITS PO TABS
20880.0000 [IU] | ORAL_TABLET | Freq: Once | ORAL | Status: AC
  Administered 2022-11-29: 20880 [IU]
  Filled 2022-11-29: qty 2

## 2022-11-29 MED ORDER — SPIRONOLACTONE 25 MG PO TABS
25.0000 mg | ORAL_TABLET | Freq: Every day | ORAL | Status: DC
  Administered 2022-11-29 – 2022-12-03 (×5): 25 mg
  Filled 2022-11-29 (×6): qty 1

## 2022-11-29 MED ORDER — SODIUM PHOSPHATES 45 MMOLE/15ML IV SOLN
45.0000 mmol | Freq: Once | INTRAVENOUS | Status: AC
  Administered 2022-11-29: 45 mmol via INTRAVENOUS
  Filled 2022-11-29: qty 15

## 2022-11-29 MED ORDER — SODIUM PHOSPHATES 45 MMOLE/15ML IV SOLN
15.0000 mmol | Freq: Once | INTRAVENOUS | Status: AC
  Administered 2022-11-29: 15 mmol via INTRAVENOUS
  Filled 2022-11-29: qty 5

## 2022-11-29 NOTE — Progress Notes (Addendum)
NAME:  Cynthia Underwood, MRN:  [id number], DOB:  07-25-1971, LOS: 71 ADMISSION DATE:  11/11/2022 CONSULTATION DATE:  11/14/2022 REFERRING MD:  Wyline Copas - TRH CHIEF COMPLAINT:  Vfib arrest on floor   History of Present Illness:   52 year old woman who initially presented to [facility] 3/9 for cough and progressive dyspnea x 4 days. Also reported diarrhea and 1 episode of epigastric pain with vomiting. PMHx significant for RBBB/high-grade heart block (s/p PPM), hypothyroidism, sickle cell trait, myotonic dystrophy (dx age 53), tobacco use.  Noted to be hypoxic in ED with O2 sat 88% on RA, CTA Chest c/f LLL PNA with mild bronchiectasis, negative for PE. Also c/f esophagitis. Patient was empirically started on CAP coverage (ceftriaxone, azithromycin) and GERD treatment (PPI/sucralfate). Also found to be positive for human metapneumovirus. Admitted to [facility].  Patient was initially progressing as expected 3/11-3/12 until ~1400, sat was noted to be in 80s. Placed on 5L humidified oxygen with slight improvement in sats. Solumedrol and DuoNebs were ordered. Around 1900 patient's RN was informed by telemetry of VF and was found unresponsive and pulseless. Code Blue was called with CPR x 15 minutes and ROSC. Amiodarone, Epi and defib were administered. Patient was intubated by anesthesia peri-code. IO was placed and Levophed was initiated. Cardiology was consulted.  PCCM was consulted for ICU admission post-arrest.  Pertinent Medical History:  Heart block, Goiter, Sickle cell trait, Myotonic dystrophy  Significant Hospital Events: Including procedures, antibiotic start and stop dates in addition to other pertinent events   3/9 - Presented to [facility] for LLL PNA. Started on Surveyor, quantity. +human metapneumovirus. 3/12 - Hypoxic requiring 5LNC. Later VF arrest on floor, CPR 3/13 echocardiogram LVEF severely depressed 50% (decreased compared with 01/2021), severely reduced RV function 3/14 Cardiac catheterization showed  normal coronaries, new normal filling pressures and normal PA pressure.  With excellent cardiac output 3/15 Extubated.  3/16 Reintubated for increased work of breathing, failed BiPAP 3/18 alert, interactive off vasopressors 3/20 Wean, trial extubation today  3/21 No acute issues overnight, failed early am wean due to apnea on 50 Fentanyl and 0.8 Pecedex  3/24 PRBC transfusion 3/25 Percutaneous tracheostomy  Interim History / Subjective:   Remains anxious, unable to wean on pressure support Episode of diarrhea overnight  Objective:  Blood pressure 97/74, pulse 76, temperature 99.2 F (37.3 C), temperature source Axillary, resp. rate (!) 24, height 5\' 3"  (1.6 m), weight 41.2 kg, last menstrual period 12/03/2016, SpO2 95 %.    Vent Mode: CPAP FiO2 (%):  [30 %-40 %] 30 % Set Rate:  [24 bmp] 24 bmp Vt Set:  [410 mL] 410 mL PEEP:  [5 cmH20] 5 cmH20 Pressure Support:  [12 cmH20] 12 cmH20 Plateau Pressure:  [16 cmH20-20 cmH20] 16 cmH20   Intake/Output Summary (Last 24 hours) at 11/29/2022 0912 Last data filed at 11/29/2022 0800 Gross per 24 hour  Intake 3846.5 ml  Output 2625 ml  Net 1221.5 ml   Filed Weights   11/26/22 0308 11/27/22 0500 11/29/22 0400  Weight: 41.9 kg 41.6 kg 41.2 kg   Physical Examination: Gen:      No acute distress HEENT:  EOMI, sclera anicteric, trach Neck:     No masses; no thyromegaly Lungs:    Clear to auscultation bilaterally; normal respiratory effort CV:         Regular rate and rhythm; no murmurs Abd:      + bowel sounds; soft, non-tender; no palpable masses, no distension Ext:    No edema; adequate peripheral  perfusion Skin:      Warm and dry; no rash Neuro: alert and oriented x 3 Psych: normal mood and affect   Labs, imaging reviewed Significant for sodium 134, Phos 1.4 WBC improved to 11.7, hemoglobin 8.8, platelets 446 No new imaging  Resolved Hospital Problem List:  Septic shock from pneumonia, Cardiogenic shock from cardiac arrest, Shock  liver in setting of cardiac arrest  Assessment & Plan:   Post-Vfib arrest. Elevated troponin. Heart block s/p MDT PPM. New acute non ischemic systolic CHF with EF [id number]. CHF team following  Acute hypoxic respiratory failure from cardiac arrest, CHF, Metapneumovirus pneumonia, HCAP, muscle weakness from myotonic dystrophy. S/p tracheostomy on 3/25 Hospital-acquired pneumonia SLP consult for PMV Start pressure support weans as tolerated Day 7/7 of cefepime, f/u blood culture from 3/21 and BAL cultures from [id number]   Acute metabolic encephalopathy from anoxia, sepsis. RASS goal 0 to -1 Started Seroquel PRN 3/25 for anxiety. Will consult psych    Hyperglycemia. SSI   GERD with esophagitis. Continue PPI   Hypothyroidism. Continue levothyroxine   Constipation. Bowel regimen  Anemia of iron deficiency, critical illness and chronic disease. f/u CBC Transfuse for Hb < 7 Given Ferrlecit on Sunday 3/24 for iron supplementation  Hypophosphatemia. Continue replacement  Severe malnutrition, present on admission Continue tube feeds  Best Practice: (right click and "Reselect all SmartList Selections" daily)   Diet/type: tubefeeds DVT prophylaxis: Lovenox GI prophylaxis: PPI Lines: Central line Foley:  N/A Code Status:  full code Last date of multidisciplinary goals of care discussion [updated her mother at bedside]  Signature:   The patient is critically ill with multiple organ system failure and requires high complexity decision making for assessment and support, frequent evaluation and titration of therapies, advanced monitoring, review of radiographic studies and interpretation of complex data.   Critical Care Time devoted to patient care services, exclusive of separately billable procedures, described in this note is 35 minutes.   Marshell Garfinkel MD [facility] See Amion for pager  If no response to pager , please call [phone] until 7pm After  7:00 pm call Elink  [phone] 11/29/2022, 9:12 AM

## 2022-11-29 NOTE — Progress Notes (Signed)
[facility] ADULT ICU REPLACEMENT PROTOCOL   The patient does apply for the [facility] Adult ICU Electrolyte Replacment Protocol based on the criteria listed below:   1.Exclusion criteria: TCTS, ECMO, Dialysis, and Myasthenia Gravis patients 2. Is GFR >/= 30 ml/min? Yes.    Patient's GFR today is >60 3. Is SCr </= 2? Yes.   Patient's SCr is 0.48 mg/dL 4. Did SCr increase >/= 0.5 in 24 hours? No. 5.Pt's weight >40kg  Yes.   6. Abnormal electrolyte(s): phos 1.4  7. Electrolytes replaced per protocol 8.  Call MD STAT for K+ </= 2.5, Phos </= 1, or Mag </= 1 Physician:  protocol  Darlys Gales 11/29/2022 5:48 AM

## 2022-11-29 NOTE — Progress Notes (Signed)
SLP Cancellation Note  Patient Details Name: Cynthia Underwood MRN: [id number] DOB: 01-Apr-1971   Cancelled treatment:       Reason Eval/Treat Not Completed: Patient not medically ready;Other (comment) (Patient unable to go on trach collar today. SLP will continue to follow for readiness)  Sonia Baller, MA, CCC-SLP Speech Therapy

## 2022-11-29 NOTE — Consult Note (Addendum)
Brief Psychiatry Consult Note  I was asked to see pt by Dr. Vaughan Browner for depression and anxiety after a long, traumatic hospital stay involving a prolonged v-fib arrest. Notably pt is trached and communicates via gestures and mouthing words.   When I went to room, pt was very clear that she did not want to talk to psychiatry (asked verbally, wrote it down, etc). She did not answer further questions beyond checking "no" when asked if she had mood issues. She had fairly marked deficits in attention, had difficulty processing speech (after I wrote down some questions, communication was more successful, but she took a very long time to read things. Based on this and on nursing notes I suspect there is a component of ongoing encephalopathy which is not unexpected in a pt with a long hospitalization.   I tried to explain to mom that, while pt has some confusion, I was not going to force her to have a psych evaluation as she was very clear that she did not want to talk to a psychiatrist, and no parties (family, primary team, etc had any safety concerns). Mom continued to repeat that pt needed to "understand" why she was in the hospital, how sick she was, what her road to recovery looked like, etc when I tried to excuse myself. It sounds like family is more worried about confusion and intermittent agitation than mood. I tried to provide some education on natural course of delirium, how continued re-orientation (preferably by someone the pt knows and trusts) is essential, etc.   Eventually we were able to get ahold of pt's sister, whose connection kept fuzzing out. Sister will be here Friday. Please reconsult on Friday.     - no charge, pt declined consult.  - strongly suspect ICU delirium; please de-escalate cefepime if possible and minimize deliriogenic agents.  - would avoid starting antidepressant at this time (serotonergic agents can lengthen delirium)  - please reconsult Friday in AM.    Joycelyn Schmid A  Kentaro Alewine

## 2022-11-29 NOTE — Progress Notes (Addendum)
Advanced Heart Failure Rounding Note  PCP-Cardiologist: Fransico Him, MD   Subjective:   Cath 3/14 c/w nonischemic cardiomyopathy, filling pressures minimally elevated on RHC. Excellent cardiac output by Fick. Extubated 3/15. Reintubated (3/16). Now s/p trach 3/25  Remains a-paced at 57. No further VT/VF/torsades  Remains on vent through trach   CVP 4. Had diarrhea yesterday     Objective:   Weight Range: 41.2 kg Body mass index is 16.09 kg/m.   Vital Signs:   Temp:  [98.5 F (36.9 C)-99.4 F (37.4 C)] 99.2 F (37.3 C) (03/27 0757) Pulse Rate:  [68-126] 76 (03/27 0817) Resp:  [14-27] 24 (03/27 0817) BP: (89-138)/(54-114) 97/74 (03/27 0817) SpO2:  [88 %-100 %] 95 % (03/27 0829) FiO2 (%):  [30 %-40 %] 30 % (03/27 0829) Weight:  [41.2 kg] 41.2 kg (03/27 0400) Last BM Date : 11/28/22  Weight change: Filed Weights   11/26/22 0308 11/27/22 0500 11/29/22 0400  Weight: 41.9 kg 41.6 kg 41.2 kg   Intake/Output:   Intake/Output Summary (Last 24 hours) at 11/29/2022 0915 Last data filed at 11/29/2022 0800 Gross per 24 hour  Intake 3846.5 ml  Output 2625 ml  Net 1221.5 ml   Physical Exam   CVP 4  General:  thin/ frail and chronically ill appearing  HEENT: +cortrak, +trach Neck: supple. JVD not elevated, + left IJ CVC. Carotids 2+ bilat; no bruits. No lymphadenopathy or thyromegaly appreciated. Cor: PMI nondisplaced. Regular rate & rhythm. No rubs, gallops or murmurs. Lungs: decreased BS anteriorly  Abdomen: soft, nontender, nondistended. No hepatosplenomegaly. No bruits or masses. Good bowel sounds. Extremities: no cyanosis, clubbing, rash, edema. Neuro: nods appropriately on trach/vent Neuro: affect flat. Moves all 4 ext w/o difficulty  GU: + foley   Telemetry   A paced 70 No further VT/VF/torsades (Personally reviewed)    Labs    CBC Recent Labs    11/28/22 0355 11/29/22 0354  WBC 16.5* 11.7*  NEUTROABS  --  9.1*  HGB 9.3* 8.8*  HCT 28.0* 26.7*   MCV 96.2 96.4  PLT 426* 123XX123*   Basic Metabolic Panel Recent Labs    11/27/22 0313 11/28/22 0355 11/28/22 0638 11/29/22 0354  NA 134* 133*  --  134*  K 4.2 4.4  --  4.2  CL 101 100  --  97*  CO2 22 22  --  28  GLUCOSE 120* 122*  --  131*  BUN 13 17  --  15  CREATININE 0.57 0.69  --  0.48  CALCIUM 8.0* 8.2*  --  8.5*  MG 1.9  --  2.2  --   PHOS 1.9*  --  2.1* 1.4*   Liver Function Tests No results for input(s): "AST", "ALT", "ALKPHOS", "BILITOT", "PROT", "ALBUMIN" in the last 72 hours.  No results for input(s): "LIPASE", "AMYLASE" in the last 72 hours. Cardiac Enzymes No results for input(s): "CKTOTAL", "CKMB", "CKMBINDEX", "TROPONINI" in the last 72 hours.  BNP: BNP (last 3 results) Recent Labs    11/11/22 1238 11/17/22 2150  BNP 58.5 145.2*   ProBNP (last 3 results) No results for input(s): "PROBNP" in the last 8760 hours.  D-Dimer No results for input(s): "DDIMER" in the last 72 hours. Hemoglobin A1C No results for input(s): "HGBA1C" in the last 72 hours.  Fasting Lipid Panel No results for input(s): "CHOL", "HDL", "LDLCALC", "TRIG", "CHOLHDL", "LDLDIRECT" in the last 72 hours. Thyroid Function Tests No results for input(s): "TSH", "T4TOTAL", "T3FREE", "THYROIDAB" in the last 72 hours.  Invalid  input(s): "FREET3"  Other results:  Imaging   No results found.  Medications:     Scheduled Medications:  Chlorhexidine Gluconate Cloth  6 each Topical Daily   enoxaparin (LOVENOX) injection  30 mg Subcutaneous Q24H   feeding supplement (PROSource TF20)  60 mL Per Tube Daily   fiber supplement (BANATROL TF)  60 mL Per Tube BID   furosemide  20 mg Per Tube Daily   insulin aspart  0-9 Units Subcutaneous Q4H   levothyroxine  112 mcg Per Tube QAC breakfast   mouth rinse  15 mL Mouth Rinse Q2H   oxyCODONE  5 mg Per Tube Q6H   pantoprazole (PROTONIX) IV  40 mg Intravenous QHS   sodium chloride flush  10-40 mL Intracatheter Q12H   sodium chloride flush  3 mL  Intravenous Q12H   spironolactone  12.5 mg Per Tube Daily   sucralfate  1 g Per Tube BID    Infusions:  sodium chloride 250 mL (11/29/22 0827)   ceFEPime (MAXIPIME) IV Stopped (11/28/22 2137)   dexmedetomidine (PRECEDEX) IV infusion 1.8 mcg/kg/hr (11/29/22 0826)   feeding supplement (OSMOLITE 1.5 CAL) 50 mL/hr at 11/29/22 0800   fentaNYL infusion INTRAVENOUS 25 mcg/hr (11/29/22 0800)   sodium phosphate 45 mmol in dextrose 5 % 250 mL infusion 44 mL/hr at 11/29/22 0800    PRN Medications: sodium chloride, acetaminophen, diphenhydrAMINE, docusate, fentaNYL, guaiFENesin, ipratropium-albuterol, lidocaine, lip balm, melatonin, midazolam, mouth rinse, polyethylene glycol, QUEtiapine, sodium chloride flush, sodium chloride flush  Patient Profile   52 y.o. female with a hx of tobacco use, COPD, myotonic dystrophy type I since age 59, high grade heart block s/p PPM, RBBB, sickle cell trait, and DM1 (questionable diagnosis) and strong family history of heart failure (father s/p cardiac transplant and paternal aunt w/ NICM w/ LVAD), admitted w/ acute hypoxic respiratory failure/ LLL CAP secondary to metapneumovirus. Hospital course c/b VF arrest. Post arrest echo w/ severe BiV failure, LVEF < 20%, RV severely reduced + diffuse HK   Assessment/Plan  1. VF arrest: 3/12, shocked. 15 minutes CPR total.  - Due to long-short interval (R-on-T) in the setting of QT-prolonging drugs (azithro) and hypokalemia - Pacemaker lower rate limit increased to 90 to prevent pause-dependent TdP; now down to 70 - Per EP: may require upgrade with LV lead +/-upgrade to primary prevention ICD if EF does not recover with GDMT. VT this admission was attributable to a reversible cause, so ICD not indicated urgently for secondary prevention. Can consider placement of LifeVest on discharge but given stability of rhythm and need for chronic trach care would likely defer. - No arrhythmias on tele - Keep K> 4.0 Mg > 2.0 - avoid QT  prolonging agents  2. Acute hypoxemic respiratory failure: Intubated post-VF arrest.  Had viral PNA with metapneumovirus at presentation.  - extubated 3/14 Reintubated 3/16 - Failed multiple SBTs - Trached and bronch 3/25 - CCM managing.   3. Acute systolic CHF: Prior workup for cardiac sarcoidosis. Cardiac MRI in 2017 showed subendocardial LGE in the mid inferior and inferolateral walls with EF 56%.  Cardiac PET done after this was NOT suggestive of cardiac sarcoidosis.  - Echo in 5/22 with normal EF.   - Echo this admission reviewed, EF 20-25% range with diffuse hypokinesis but relatively preserved function at the apex, moderate RV dysfunction.  - cath 3/15 no CAD - suspect due to myotonic dystrophy type 1 -> she has an aunt with LVAD who has cardiomyopathy due to myotonic dystrophy.  Father  had a heart transplant, uncertain of underlying condition. R/LHC c/w Nonischemic cardiomyopathy.  - CVP 4. SBPs upper 90s.  - Increase spiro to 25 mg daily - can use Lasix PRN  - SGLT2i when foley removed - BP too soft currently for ARNi. Addition of possible low dose losartan soon if BP can tolerate  - Given overall debility, she is not currently a candidate for advanced therapies - Would consider palliative care input    4. High grade heart block: Has MDT PPM with His bundle lead.  This is likely due to cardiac involvement of myotonic dystrophy type 1.  - plan as above  5. Myotonic dystrophy: Type 1, diagnosed at age 40. Has family members with the same diagnosis.  Followed by neurology.   6. Tobacco abuse - needs cessation  7. Hypokalemia/hypomag - supp as needed - continue spiro   8. Anemia of chronic illness - s/p 1uPRBC this admit - hgb stable today at 8.8   Meds per above. We will increase spiro today. Can later add Jardiance once foley is removed. Add low dose losartan once SBPs consistently above 110.   Length of Stay: [id number] PA-C  11/29/2022, 9:15  AM  Advanced Heart Failure Team Pager [phone] (M-F; 7a - 5p)  Please contact [facility] for night-coverage after hours (5p -7a ) and weekends on [email or web address]

## 2022-11-30 ENCOUNTER — Inpatient Hospital Stay (HOSPITAL_COMMUNITY): Payer: 59

## 2022-11-30 DIAGNOSIS — J9601 Acute respiratory failure with hypoxia: Secondary | ICD-10-CM | POA: Diagnosis not present

## 2022-11-30 LAB — BASIC METABOLIC PANEL
Anion gap: 10 (ref 5–15)
BUN: 13 mg/dL (ref 6–20)
CO2: 25 mmol/L (ref 22–32)
Calcium: 8.5 mg/dL — ABNORMAL LOW (ref 8.9–10.3)
Chloride: 99 mmol/L (ref 98–111)
Creatinine, Ser: 0.5 mg/dL (ref 0.44–1.00)
GFR, Estimated: >60 mL/min (ref >60)
Glucose, Bld: 134 mg/dL — ABNORMAL HIGH (ref 70–99)
Potassium: 3.7 mmol/L (ref 3.5–5.1)
Sodium: 134 mmol/L — ABNORMAL LOW (ref 135–145)

## 2022-11-30 LAB — CBC
HCT: 26.9 % — ABNORMAL LOW (ref 36.0–46.0)
Hemoglobin: 9 g/dL — ABNORMAL LOW (ref 12.0–15.0)
MCH: 31.8 pg (ref 26.0–34.0)
MCHC: 33.5 g/dL (ref 30.0–36.0)
MCV: 95.1 fL (ref 80.0–100.0)
Platelets: 486 10*3/uL — ABNORMAL HIGH (ref 150–400)
RBC: 2.83 MIL/uL — ABNORMAL LOW (ref 3.87–5.11)
RDW: 16.1 % — ABNORMAL HIGH (ref 11.5–15.5)
WBC: 10 10*3/uL (ref 4.0–10.5)
nRBC: 0 % (ref 0.0–0.2)

## 2022-11-30 LAB — GLUCOSE, CAPILLARY
Glucose-Capillary: 131 mg/dL — ABNORMAL HIGH (ref 70–99)
Glucose-Capillary: 145 mg/dL — ABNORMAL HIGH (ref 70–99)
Glucose-Capillary: 121 mg/dL — ABNORMAL HIGH (ref 70–99)
Glucose-Capillary: 127 mg/dL — ABNORMAL HIGH (ref 70–99)
Glucose-Capillary: 118 mg/dL — ABNORMAL HIGH (ref 70–99)
Glucose-Capillary: 117 mg/dL — ABNORMAL HIGH (ref 70–99)

## 2022-11-30 LAB — PHOSPHORUS: Phosphorus: 3.2 mg/dL (ref 2.5–4.6)

## 2022-11-30 MED ORDER — SCOPOLAMINE 1 MG/3DAYS TD PT72
1.0000 | MEDICATED_PATCH | TRANSDERMAL | Status: DC
  Administered 2022-11-30 – 2022-12-03 (×2): 1.5 mg via TRANSDERMAL
  Filled 2022-11-30 (×3): qty 1

## 2022-11-30 MED ORDER — POTASSIUM CHLORIDE 20 MEQ PO PACK
40.0000 meq | PACK | Freq: Once | ORAL | Status: AC
  Administered 2022-11-30: 40 meq
  Filled 2022-11-30: qty 2

## 2022-11-30 NOTE — TOC Initial Note (Signed)
Transition of Care [facility]) - Initial/Assessment Note    Patient Details  Name: Cynthia Underwood MRN: [id number] Date of Birth: 25-Mar-1971  Transition of Care [facility]) CM/SW Contact:    Erenest Rasher, RN Phone Number: [phone] 11/30/2022, 12:40 PM  Clinical Narrative:               Family plans to make final decision about LTAC. [employer] rep, Anderson Malta has provided family with her contact. She will start auth once final decision for LTAC.    Expected Discharge Plan: Long Term Acute Care (LTAC) Barriers to Discharge: Continued Medical Work up   Patient Goals and CMS Choice Patient states their goals for this hospitalization and ongoing recovery are:: wants sister to recover          Expected Discharge Plan and Services   Discharge Planning Services: CM Consult Post Acute Care Choice: Long Term Acute Care (LTAC)                                        Prior Living Arrangements/Services   Lives with:: Relatives Patient language and need for interpreter reviewed:: Yes              Criminal Activity/Legal Involvement Pertinent to Current Situation/Hospitalization: No - Comment as needed  Activities of Daily Living   ADL Screening (condition at time of admission) Patient's cognitive ability adequate to safely complete daily activities?: Yes  Permission Sought/Granted Permission sought to share information with : Case Manager    Share Information with NAME: Tiamarie Zabinski HCPOA     Permission granted to share info w Relationship: sister  Permission granted to share info w Contact Information: [id number]  Emotional Assessment   Attitude/Demeanor/Rapport: Intubated (Following Commands or Not Following Commands)          Admission diagnosis:  CAP (community acquired pneumonia) [J18.9] Esophagitis [K20.90] Acute hypoxemic respiratory failure ([place]) [J96.01] Community acquired pneumonia, unspecified laterality [J18.9] Patient Active Problem List    Diagnosis Date Noted   Acute systolic heart failure ([place]) 11/26/2022   Drug-induced torsades de pointes ([place]) [id number]   Acute systolic CHF (congestive heart failure) ([place]) 11/16/2022   Acute hypoxemic respiratory failure ([place]) 11/15/2022   Acute on chronic combined systolic and diastolic CHF (congestive heart failure) ([place]) 11/15/2022   Protein-calorie malnutrition, severe 11/15/2022   Cardiac arrest ([place]) 11/14/2022   CAP (community acquired pneumonia) 11/11/2022   Hypothyroidism 11/07/2021   Thyroid nodule 04/21/2021   S/P total thyroidectomy 04/21/2021   Sinus node dysfunction ([place]) 03/10/2020   Weakness of both legs    Watery eyes    Sickle cell trait (HCC)    Presence of permanent cardiac pacemaker    Excess ear wax    Depression    Anemia    Cardiac pacemaker in situ 03/01/2017   Mobitz type 2 second degree heart block 02/15/2017   Symptomatic advanced heart block 02/12/2017   SOB (shortness of breath) 10/02/2014   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   Bifascicular block 10/02/2014   Infection due to trichomonas vaginalis 09/15/2014   Myotonic dystrophy, type 1 ([place]) 09/14/2014   PCP:  Charlott Rakes, MD Pharmacy:   [id number] [place], [place] [id number] [place] [id number] Phone: [phone] Fax: [phone]     Social Determinants of  Health (SDOH) Social History: SDOH Screenings   Food Insecurity: No Food Insecurity (10/16/2022)  Housing: Low Risk  (10/16/2022)  Transportation Needs: No Transportation Needs (10/16/2022)  Utilities: Not At Risk (10/16/2022)  Alcohol Screen: Low Risk  (10/16/2022)  Depression (PHQ2-9): Medium Risk (11/08/2022)  Financial Resource Strain: Low Risk  (10/16/2022)  Physical Activity: Inactive (10/16/2022)  Social Connections: Socially Isolated (10/16/2022)  Stress: Stress Concern Present (10/16/2022)  Tobacco  Use: High Risk (11/17/2022)   SDOH Interventions:     Readmission Risk Interventions     No data to display

## 2022-11-30 NOTE — Progress Notes (Signed)
Pt placed on PS/CPAP 8/5 on 40% and is tolerating well. RT will monitor. 

## 2022-11-30 NOTE — Progress Notes (Signed)
[facility] ADULT ICU REPLACEMENT PROTOCOL   The patient does apply for the [facility] Adult ICU Electrolyte Replacment Protocol based on the criteria listed below:   1.Exclusion criteria: TCTS, ECMO, Dialysis, and Myasthenia Gravis patients 2. Is GFR >/= 30 ml/min? Yes.    Patient's GFR today is >60 3. Is SCr </= 2? Yes.   Patient's SCr is 0.50 mg/dL 4. Did SCr increase >/= 0.5 in 24 hours? No. 5.Pt's weight >40kg  Yes.   6. Abnormal electrolyte(s): potassium 3.7  7. Electrolytes replaced per protocol 8.  Call MD STAT for K+ </= 2.5, Phos </= 1, or Mag </= 1 Physician:  protocol  Darlys Gales 11/30/2022 5:17 AM

## 2022-11-30 NOTE — Progress Notes (Signed)
Pt placed on ATC 12L/40% and is tolerating well. RT will monitor.

## 2022-11-30 NOTE — Progress Notes (Addendum)
Physical Therapy Treatment Patient Details Name: Cynthia Underwood MRN: [id number] DOB: [birth year] Today's Date: 11/30/2022   History of Present Illness 52 y.o. admitted 3/9 with Left lower lobe community-acquired pneumonia, Acute hypoxic respiratory failure.   Pt with Vfib arrest on 3/12 with CPR and VDRF wtih transfer to ICU. Extubated 3/15 and re-intubated 3/16. s/p trach 3/25. PMH includes hypothyroidism, RBBB/high-grade heart block (s/p PPM),  sickle cell trait, myotonic dystrophy (dx age 69), tobacco use    PT Comments    Pt admitted with above diagnosis. Pt was able to sit EOB for 4 minutes with min to mod assist with pt fatiguiing quickly as well as with soft BP causing dizziness and pt asking to lie down.  Pt did appear to need less assist today with bed mobilityand sitting balance but continues to be limited by hypotension and fatigue.  Pt currently with functional limitations due to balance and endurance deficits. Pt will benefit from acute skilled PT to increase their independence and safety with mobility to allow discharge.      Recommendations for follow up therapy are one component of a multi-disciplinary discharge planning process, led by the attending physician.  Recommendations may be updated based on patient status, additional functional criteria and insurance authorization.  Follow Up Recommendations       Assistance Recommended at Discharge Frequent or constant Supervision/Assistance  Patient can return home with the following A lot of help with walking and/or transfers;A lot of help with bathing/dressing/bathroom;Assistance with cooking/housework;Assist for transportation;Help with stairs or ramp for entrance   Equipment Recommendations  Other (comment) (tbd)    Recommendations for Other Services Rehab consult     Precautions / Restrictions Precautions Precautions: Fall Precaution Comments: trach (PEEP 5, 30%), flexi seal, foley, cortrak Restrictions Weight  Bearing Restrictions: No     Mobility  Bed Mobility Overal bed mobility: Needs Assistance Bed Mobility: Supine to Sit, Sit to Supine     Supine to sit: Mod assist, +2 for physical assistance Sit to supine: +2 for physical assistance, Max assist   General bed mobility comments: mod assist +2 for trunk and LE management with pt initiating roll and side to sit, scooting to/from EOB. Coughing at EOB, tolerated EOB sitting x4 minutes before fatiguing and BP drop (see note)    Transfers                        Ambulation/Gait                   Stairs             Wheelchair Mobility    Modified Rankin (Stroke Patients Only)       Balance Overall balance assessment: Needs assistance Sitting-balance support: No upper extremity supported, Feet supported, Bilateral upper extremity supported Sitting balance-Leahy Scale: Poor Sitting balance - Comments: EOB sitting tolerance x 4 minutes.  Pt was able to sit with min to mod assist with pt initiating trunk activity to sit EOB but could maintain. Used UEs intermittently for balance. Pt limited as BP dropped. Postural control: Posterior lean, Right lateral lean, Left lateral lean                                  Cognition Arousal/Alertness: Awake/alert Behavior During Therapy: WFL for tasks assessed/performed Overall Cognitive Status: Difficult to assess  General Comments: follows one-step commands with delay following commands, communicates needs with gesturing and mouthing (feels dizzy, needs to lay down; chest discomfort with coughing)        Exercises General Exercises - Upper Extremity Shoulder Flexion: AROM, Both, 5 reps, Seated Elbow Flexion: AROM, Both, 5 reps, Seated Elbow Extension: AROM, Both, 5 reps, Seated General Exercises - Lower Extremity Ankle Circles/Pumps: AROM, Both, 5 reps, Supine Quad Sets: AROM, Both, 5 reps, Supine Heel  Slides: Both, 5 reps, Supine, AAROM Hip ABduction/ADduction: Both, 5 reps, Supine, AAROM    General Comments General comments (skin integrity, edema, etc.): Vent via trach 30%FiO2 and PEEP 5      Pertinent Vitals/Pain Pain Assessment Pain Assessment: Faces Faces Pain Scale: Hurts even more Pain Location: chest, with coughing, trach site Pain Descriptors / Indicators: Sore, Discomfort, Grimacing Pain Intervention(s): Limited activity within patient's tolerance, Monitored during session, Premedicated before session, Repositioned    Home Living                          Prior Function            PT Goals (current goals can now be found in the care plan section) Acute Rehab PT Goals Patient Stated Goal: get well Progress towards PT goals: Not progressing toward goals - comment (due to BP drop/fatigue)    Frequency    Min 3X/week      PT Plan Current plan remains appropriate    Co-evaluation PT/OT/SLP Co-Evaluation/Treatment: Yes Reason for Co-Treatment: Complexity of the patient's impairments (multi-system involvement);For patient/therapist safety;To address functional/ADL transfers PT goals addressed during session: Mobility/safety with mobility;Balance        AM-PAC PT "6 Clicks" Mobility   Outcome Measure  Help needed turning from your back to your side while in a flat bed without using bedrails?: Total Help needed moving from lying on your back to sitting on the side of a flat bed without using bedrails?: Total Help needed moving to and from a bed to a chair (including a wheelchair)?: Total Help needed standing up from a chair using your arms (e.g., wheelchair or bedside chair)?: Total Help needed to walk in hospital room?: Total Help needed climbing 3-5 steps with a railing? : Total 6 Click Score: 6    End of Session Equipment Utilized During Treatment: Other (comment) (trach) Activity Tolerance: Patient limited by fatigue Patient left: in bed;with  call bell/phone within reach;with bed alarm set;with family/visitor present;with SCD's reapplied Nurse Communication: Mobility status;Need for lift equipment PT Visit Diagnosis: Unsteadiness on feet (R26.81);Other abnormalities of gait and mobility (R26.89);Muscle weakness (generalized) (M62.81)     Time: [id number] PT Time Calculation (min) (ACUTE ONLY): 33 min  Charges:  $Therapeutic Activity: 8-22 mins                     [facility] M,PT Acute Rehab Services [phone]    [name] 11/30/2022, 11:26 AM

## 2022-11-30 NOTE — Progress Notes (Signed)
Occupational Therapy Treatment Patient Details Name: Cynthia Underwood MRN: [id number] DOB: Feb 05, 1971 Today's Date: 11/30/2022   History of present illness 52 y.o. admitted 3/9 with Left lower lobe community-acquired pneumonia, Acute hypoxic respiratory failure.   Pt with Vfib arrest on 3/12 with CPR and VDRF wtih transfer to ICU. Extubated 3/15 and re-intubated 3/16. s/p trach 3/25. 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 Able to tolerate about 4 minutes sitting EOB before onset of symptomatic orthostatic hypotension. +2 assist utilized. Pt's mother present throughout session. Plan to provide built-up foam for functional hand held items ie pen as pt struggles a bit with grip strength. BP at start of session lying in bed was 91/63. Sitting EOB bp dropped to 69/50 with pt indicating she was feeling poorly. Pt returned to supine and after a few minutes placed bed into chair position. BP at end of session in this position was 93/62. D/c recommendation remains appropriate.    Recommendations for follow up therapy are one component of a multi-disciplinary discharge planning process, led by the attending physician.  Recommendations may be updated based on patient status, additional functional criteria and insurance authorization.    Assistance Recommended at Discharge Frequent or constant Supervision/Assistance  Patient can return home with the following  A lot of help with walking and/or transfers;Two people to help with walking and/or transfers;A lot of help with bathing/dressing/bathroom;Two people to help with bathing/dressing/bathroom;Direct supervision/assist for financial management;Direct supervision/assist for medications management;Assist for transportation;Help with stairs or ramp for entrance;Assistance with cooking/housework;Assistance with  feeding   Equipment Recommendations  Other (comment)    Recommendations for Other Services      Precautions / Restrictions Precautions Precautions: Fall Precaution Comments: trach (PEEP 5, 30%), flexi seal, foley, cortrak Restrictions Weight Bearing Restrictions: No       Mobility Bed Mobility Overal bed mobility: Needs Assistance Bed Mobility: Supine to Sit, Sit to Supine     Supine to sit: Mod assist, +2 for physical assistance Sit to supine: +2 for physical assistance, Max assist   General bed mobility comments: mod assist +2 for trunk and LE management with pt initiating roll and side to sit, scooting to/from EOB. Coughing at EOB, tolerated EOB sitting x4 minutes before fatiguing and BP drop (see note)    Transfers                   General transfer comment: unable to attempt given symptomatic hypotension     Balance Overall balance assessment: Needs assistance Sitting-balance support: No upper extremity supported, Feet supported, Bilateral upper extremity supported Sitting balance-Leahy Scale: Poor Sitting balance - Comments: EOB sitting tolerance x 4 minutes.  Pt was able to sit with min to mod assist with pt initiating trunk activity to sit EOB but could maintain. Used UEs intermittently for balance. Pt limited as BP dropped. Postural control: Posterior lean, Right lateral lean, Left lateral lean                                 ADL either performed or assessed with clinical judgement   ADL Overall ADL's : Needs assistance/impaired  General ADL Comments: Pt able to lightly hold functional items in right (dominant hand) and bring to mouth. Struggles with holding pen, plan to issue built up foam.    Extremity/Trunk Assessment Upper Extremity Assessment Upper Extremity Assessment: Generalized weakness   Lower Extremity Assessment Lower Extremity Assessment: Defer to PT evaluation         Vision       Perception     Praxis      Cognition Arousal/Alertness: Awake/alert Behavior During Therapy: WFL for tasks assessed/performed Overall Cognitive Status: Difficult to assess                                 General Comments: follows one-step commands with delay following commands, communicates needs with gesturing and mouthing (feels dizzy, needs to lay down; chest discomfort with coughing)        Exercises      Shoulder Instructions       General Comments Vent via trach 30%FiO2 and PEEP 5    Pertinent Vitals/ Pain       Pain Assessment Pain Assessment: Faces Faces Pain Scale: Hurts even more Pain Location: chest, with coughing, trach site Pain Descriptors / Indicators: Sore, Discomfort, Grimacing Pain Intervention(s): Limited activity within patient's tolerance, Monitored during session, Premedicated before session, Repositioned  Home Living                                          Prior Functioning/Environment              Frequency  Min 2X/week        Progress Toward Goals  OT Goals(current goals can now be found in the care plan section)  Progress towards OT goals: Progressing toward goals (slow progress)  Acute Rehab OT Goals Patient Stated Goal: did not state OT Goal Formulation: With patient Time For Goal Achievement: 12/12/22 Potential to Achieve Goals: Good ADL Goals Pt Will Perform Grooming: with mod assist;sitting Pt Will Perform Upper Body Dressing: with mod assist;sitting Pt Will Perform Lower Body Dressing: with max assist;sit to/from stand Additional ADL Goal #1: Pt will complete bed mobility with min A as a precursor to ADLs  Plan Discharge plan remains appropriate    Co-evaluation    PT/OT/SLP Co-Evaluation/Treatment: Yes Reason for Co-Treatment: Complexity of the patient's impairments (multi-system involvement);For patient/therapist safety;To address functional/ADL transfers PT  goals addressed during session: Mobility/safety with mobility;Balance OT goals addressed during session: ADL's and self-care      AM-PAC OT "6 Clicks" Daily Activity     Outcome Measure   Help from another person eating meals?: Total Help from another person taking care of personal grooming?: Total Help from another person toileting, which includes using toliet, bedpan, or urinal?: Total Help from another person bathing (including washing, rinsing, drying)?: Total Help from another person to put on and taking off regular upper body clothing?: Total Help from another person to put on and taking off regular lower body clothing?: Total 6 Click Score: 6    End of Session    OT Visit Diagnosis: Unsteadiness on feet (R26.81);Other abnormalities of gait and mobility (R26.89);Muscle weakness (generalized) (M62.81)   Activity Tolerance Other (comment) (symptomatic orthostatic hypotension)   Patient Left in bed;with call bell/phone within reach;with bed alarm set;with family/visitor present   Nurse Communication  Time: [id number] OT Time Calculation (min): 33 min  Charges: OT General Charges $OT Visit: 1 Visit OT Treatments $Self Care/Home Management : 8-22 mins  Tyrone Schimke, OT Acute Rehabilitation Services Office: [phone]   Hortencia Pilar 11/30/2022, 11:47 AM

## 2022-11-30 NOTE — Progress Notes (Addendum)
NAME:  Cynthia Underwood, MRN:  [id number], DOB:  1971/08/05, LOS: 67 ADMISSION DATE:  11/11/2022 CONSULTATION DATE:  11/14/2022 REFERRING MD:  Wyline Copas - [facility] CHIEF COMPLAINT:  Vfib arrest on floor   History of Present Illness:   52 year old woman who initially presented to [facility] 3/9 for cough and progressive dyspnea x 4 days. Also reported diarrhea and 1 episode of epigastric pain with vomiting. PMHx significant for RBBB/high-grade heart block (s/p PPM), hypothyroidism, sickle cell trait, myotonic dystrophy (dx age 20), tobacco use.  Noted to be hypoxic in ED with O2 sat 88% on RA, CTA Chest c/f LLL PNA with mild bronchiectasis, negative for PE. Also c/f esophagitis. Patient was empirically started on CAP coverage (ceftriaxone, azithromycin) and GERD treatment (PPI/sucralfate). Also found to be positive for human metapneumovirus. Admitted to [facility].  Patient was initially progressing as expected 3/11-3/12 until ~1400, sat was noted to be in 80s. Placed on 5L humidified oxygen with slight improvement in sats. Solumedrol and DuoNebs were ordered. Around 1900 patient's RN was informed by telemetry of VF and was found unresponsive and pulseless. Code Blue was called with CPR x 15 minutes and ROSC. Amiodarone, Epi and defib were administered. Patient was intubated by anesthesia peri-code. IO was placed and Levophed was initiated. Cardiology was consulted.  PCCM was consulted for ICU admission post-arrest.  Pertinent Medical History:  Heart block, Goiter, Sickle cell trait, Myotonic dystrophy  Significant Hospital Events: Including procedures, antibiotic start and stop dates in addition to other pertinent events   3/9 - Presented to [facility] for LLL PNA. Started on Surveyor, quantity. +human metapneumovirus. 3/12 - Hypoxic requiring 5LNC. Later VF arrest on floor, CPR 3/13 echocardiogram LVEF severely depressed 50% (decreased compared with 01/2021), severely reduced RV function 3/14 Cardiac catheterization showed  normal coronaries, new normal filling pressures and normal PA pressure.  With excellent cardiac output 3/15 Extubated.  3/16 Reintubated for increased work of breathing, failed BiPAP 3/18 alert, interactive off vasopressors 3/20 Wean, trial extubation today  3/21 No acute issues overnight, failed early am wean due to apnea on 50 Fentanyl and 0.8 Pecedex  3/24 PRBC transfusion 3/25 Percutaneous tracheostomy  Interim History / Subjective:   Weaning on pressure support.  No acute events overnight  Objective:  Blood pressure 113/70, pulse 69, temperature 97.9 F (36.6 C), temperature source Axillary, resp. rate (!) 24, height 5\' 3"  (1.6 m), weight 39.9 kg, last menstrual period 12/03/2016, SpO2 97 %. CVP:  [2 mmHg-17 mmHg] 17 mmHg  Vent Mode: PRVC FiO2 (%):  [30 %] 30 % Set Rate:  [24 bmp] 24 bmp Vt Set:  [410 mL] 410 mL PEEP:  [5 cmH20] 5 cmH20 Pressure Support:  [12 cmH20] 12 cmH20 Plateau Pressure:  [15 cmH20-19 cmH20] 15 cmH20   Intake/Output Summary (Last 24 hours) at 11/30/2022 0737 Last data filed at 11/30/2022 0600 Gross per 24 hour  Intake 2509.8 ml  Output 2160 ml  Net 349.8 ml   Filed Weights   11/27/22 0500 11/29/22 0400 11/30/22 0200  Weight: 41.6 kg 41.2 kg 39.9 kg   Physical Examination: Blood pressure 113/70, pulse 69, temperature 97.9 F (36.6 C), temperature source Axillary, resp. rate (!) 24, height 5\' 3"  (1.6 m), weight 39.9 kg, last menstrual period 12/03/2016, SpO2 97 %. Gen:      No acute distress HEENT:  EOMI, sclera anicteric Neck:     No masses; no thyromegaly Lungs:    Clear to auscultation bilaterally; normal respiratory effort CV:  Regular rate and rhythm; no murmurs Abd:      + bowel sounds; soft, non-tender; no palpable masses, no distension Ext:    No edema; adequate peripheral perfusion Skin:      Warm and dry; no rash Neuro: alert and oriented x 3 Psych: normal mood and affect   Labs, imaging reviewed Significant for sodium 134,  BUN/creatinine 13/0.50, hemoglobin 9.0, platelets 486   Resolved Hospital Problem List:  Septic shock from pneumonia, Cardiogenic shock from cardiac arrest, Shock liver in setting of cardiac arrest  Assessment & Plan:   Post-Vfib arrest. Elevated troponin. Heart block s/p MDT PPM. New acute non ischemic systolic CHF with EF [id number]. CHF team following  Acute hypoxic respiratory failure from cardiac arrest, CHF, Metapneumovirus pneumonia, HCAP, muscle weakness from myotonic dystrophy. S/p tracheostomy on 3/25 Hospital-acquired pneumonia SLP consult for PMV Pressure support weans.  Will try trach collar today Finished course of Cefepime.  BAL cultures from 3/25 shows normal flora   Acute metabolic encephalopathy from anoxia, sepsis. RASS goal 0 to -1 Seroquel PRN 3/25 for anxiety.  Psychiatry consulted on 3/27 to help with mood, anxiety but patient would not engage.   Hyperglycemia. SSI   GERD with esophagitis. Continue PPI   Hypothyroidism. Continue levothyroxine   Constipation. Bowel regimen  Anemia of iron deficiency, critical illness and chronic disease. f/u CBC Transfuse for Hb < 7 Given Ferrlecit on Sunday 3/24 for iron supplementation  Hypophosphatemia. Continue replacement  Severe malnutrition, present on admission Continue tube feeds  She is a good candidate for LTAC and the family still considering it.  Best Practice: (right click and "Reselect all SmartList Selections" daily)   Diet/type: tubefeeds DVT prophylaxis: Lovenox GI prophylaxis: PPI Lines: No longer needed.  Order written to d/c  Foley:  Yes, and it is still needed Code Status:  full code Last date of multidisciplinary goals of care discussion [updated her mother at bedside].   Signature:   The patient is critically ill with multiple organ system failure and requires high complexity decision making for assessment and support, frequent evaluation and titration of therapies, advanced  monitoring, review of radiographic studies and interpretation of complex data.   Critical Care Time devoted to patient care services, exclusive of separately billable procedures, described in this note is 35 minutes.   Marshell Garfinkel MD [facility] See Amion for pager  If no response to pager , please call [phone] until 7pm After 7:00 pm call Elink  [phone] 11/30/2022, 7:37 AM

## 2022-12-01 ENCOUNTER — Inpatient Hospital Stay (HOSPITAL_COMMUNITY): Payer: 59

## 2022-12-01 DIAGNOSIS — R451 Restlessness and agitation: Secondary | ICD-10-CM

## 2022-12-01 DIAGNOSIS — R410 Disorientation, unspecified: Secondary | ICD-10-CM

## 2022-12-01 DIAGNOSIS — I5021 Acute systolic (congestive) heart failure: Secondary | ICD-10-CM | POA: Diagnosis not present

## 2022-12-01 DIAGNOSIS — J9601 Acute respiratory failure with hypoxia: Secondary | ICD-10-CM | POA: Diagnosis not present

## 2022-12-01 LAB — BASIC METABOLIC PANEL
Anion gap: 13 (ref 5–15)
BUN: 13 mg/dL (ref 6–20)
CO2: 25 mmol/L (ref 22–32)
Calcium: 9.3 mg/dL (ref 8.9–10.3)
Chloride: 98 mmol/L (ref 98–111)
Creatinine, Ser: 0.59 mg/dL (ref 0.44–1.00)
GFR, Estimated: >60 mL/min (ref >60)
Glucose, Bld: 138 mg/dL — ABNORMAL HIGH (ref 70–99)
Potassium: 4.2 mmol/L (ref 3.5–5.1)
Sodium: 136 mmol/L (ref 135–145)

## 2022-12-01 LAB — CBC
HCT: 30.7 % — ABNORMAL LOW (ref 36.0–46.0)
Hemoglobin: 10 g/dL — ABNORMAL LOW (ref 12.0–15.0)
MCH: 31.7 pg (ref 26.0–34.0)
MCHC: 32.6 g/dL (ref 30.0–36.0)
MCV: 97.5 fL (ref 80.0–100.0)
Platelets: 583 10*3/uL — ABNORMAL HIGH (ref 150–400)
RBC: 3.15 MIL/uL — ABNORMAL LOW (ref 3.87–5.11)
RDW: 16.4 % — ABNORMAL HIGH (ref 11.5–15.5)
WBC: 10.4 10*3/uL (ref 4.0–10.5)
nRBC: 0.2 % (ref 0.0–0.2)

## 2022-12-01 LAB — GLUCOSE, CAPILLARY
Glucose-Capillary: 132 mg/dL — ABNORMAL HIGH (ref 70–99)
Glucose-Capillary: 135 mg/dL — ABNORMAL HIGH (ref 70–99)
Glucose-Capillary: 127 mg/dL — ABNORMAL HIGH (ref 70–99)
Glucose-Capillary: 106 mg/dL — ABNORMAL HIGH (ref 70–99)
Glucose-Capillary: 98 mg/dL (ref 70–99)
Glucose-Capillary: 106 mg/dL — ABNORMAL HIGH (ref 70–99)

## 2022-12-01 MED ORDER — QUETIAPINE FUMARATE 25 MG PO TABS: 25.0000 mg | ORAL_TABLET | Freq: Two times a day (BID) | ORAL | Status: DC

## 2022-12-01 MED ORDER — QUETIAPINE FUMARATE 25 MG PO TABS
25.0000 mg | ORAL_TABLET | Freq: Two times a day (BID) | ORAL | Status: DC | PRN
  Administered 2022-12-01: 25 mg
  Filled 2022-12-01 (×2): qty 1

## 2022-12-01 MED ORDER — GUAIFENESIN-CODEINE 100-10 MG/5ML PO SOLN
5.0000 mL | ORAL | Status: DC | PRN
  Administered 2022-12-02: 5 mL via ORAL
  Filled 2022-12-01 (×2): qty 5

## 2022-12-01 MED ORDER — OXYCODONE HCL 5 MG PO TABS
5.0000 mg | ORAL_TABLET | Freq: Four times a day (QID) | ORAL | Status: DC | PRN
  Administered 2022-12-01 – 2022-12-04 (×8): 5 mg
  Filled 2022-12-01 (×9): qty 1

## 2022-12-01 NOTE — Progress Notes (Signed)
[facility] Progress Note Patient Name: Cynthia Underwood DOB: 05/25/1971 MRN: [id number]   Date of Service  12/01/2022  HPI/Events of Note  Concern for a dislodged CorTrak feeding tube, incessant coughing.  eICU Interventions  Stat KUB, Robitussin with Codeine ordered for the cough.        Frederik Pear 12/01/2022, 11:53 PM

## 2022-12-01 NOTE — Progress Notes (Signed)
NAME:  Cynthia Underwood, MRN:  [id number], DOB:  Mar 20, 1971, LOS: 23 ADMISSION DATE:  11/11/2022 CONSULTATION DATE:  11/14/2022 REFERRING MD:  Wyline Copas - [facility] CHIEF COMPLAINT:  Vfib arrest on floor   History of Present Illness:   52 year old woman who initially presented to [facility] 3/9 for cough and progressive dyspnea x 4 days. Also reported diarrhea and 1 episode of epigastric pain with vomiting. PMHx significant for RBBB/high-grade heart block (s/p PPM), hypothyroidism, sickle cell trait, myotonic dystrophy (dx age 24), tobacco use.  Noted to be hypoxic in ED with O2 sat 88% on RA, CTA Chest c/f LLL PNA with mild bronchiectasis, negative for PE. Also c/f esophagitis. Patient was empirically started on CAP coverage (ceftriaxone, azithromycin) and GERD treatment (PPI/sucralfate). Also found to be positive for human metapneumovirus. Admitted to [facility].  Patient was initially progressing as expected 3/11-3/12 until ~1400, sat was noted to be in 80s. Placed on 5L humidified oxygen with slight improvement in sats. Solumedrol and DuoNebs were ordered. Around 1900 patient's RN was informed by telemetry of VF and was found unresponsive and pulseless. Code Blue was called with CPR x 15 minutes and ROSC. Amiodarone, Epi and defib were administered. Patient was intubated by anesthesia peri-code. IO was placed and Levophed was initiated. Cardiology was consulted.  PCCM was consulted for ICU admission post-arrest.  Pertinent Medical History:  Heart block, Goiter, Sickle cell trait, Myotonic dystrophy  Significant Hospital Events: Including procedures, antibiotic start and stop dates in addition to other pertinent events   3/9 - Presented to [facility] for LLL PNA. Started on Surveyor, quantity. +human metapneumovirus. 3/12 - Hypoxic requiring 5LNC. Later VF arrest on floor, CPR 3/13 echocardiogram LVEF severely depressed 50% (decreased compared with 01/2021), severely reduced RV function 3/14 Cardiac catheterization showed  normal coronaries, new normal filling pressures and normal PA pressure.  With excellent cardiac output 3/15 Extubated.  3/16 Reintubated for increased work of breathing, failed BiPAP 3/18 alert, interactive off vasopressors 3/20 Wean, trial extubation today  3/21 No acute issues overnight, failed early am wean due to apnea on 50 Fentanyl and 0.8 Pecedex  3/24 PRBC transfusion 3/25 Percutaneous tracheostomy  Interim History / Subjective:   She is working with speech therapist this morning.  She is alert, awake and communicating appropriately.  Sister is at bedside.  Objective:  Blood pressure (!) 80/56, pulse 70, temperature 98.4 F (36.9 C), temperature source Axillary, resp. rate (!) 22, height 5\' 3"  (1.6 m), weight 41.4 kg, last menstrual period 12/03/2016, SpO2 98 %.    Vent Mode: PRVC FiO2 (%):  [30 %-40 %] 40 % Set Rate:  [24 bmp] 24 bmp Vt Set:  [410 mL] 410 mL PEEP:  [5 cmH20] 5 cmH20 Plateau Pressure:  [16 cmH20] 16 cmH20   Intake/Output Summary (Last 24 hours) at 12/01/2022 0825 Last data filed at 12/01/2022 0600 Gross per 24 hour  Intake 1561.8 ml  Output 2275 ml  Net -713.2 ml    Filed Weights   11/29/22 0400 11/30/22 0200 12/01/22 0500  Weight: 41.2 kg 39.9 kg 41.4 kg   Physical Examination: Blood pressure 113/70, pulse 69, temperature 97.9 F (36.6 C), temperature source Axillary, resp. rate (!) 24, height 5\' 3"  (1.6 m), weight 39.9 kg, last menstrual period 12/03/2016, SpO2 97 %. Physical Exam Constitutional:      Comments: cachetic  Eyes:     General:        Right eye: No discharge.        Left eye: No discharge.  Conjunctiva/sclera: Conjunctivae normal.  Neurological:     Mental Status: She is alert.       Resolved Hospital Problem List:  Septic shock from pneumonia, Cardiogenic shock from cardiac arrest, Shock liver in setting of cardiac arrest  Assessment & Plan:  Post-Vfib arrest. Elevated troponin. Heart block s/p MDT PPM. New acute non  ischemic systolic CHF with EF 123456. Heart failure team is following.  Diuresis was held off due to euvolemia.  She only on spironolactone for GDMT.  Blood pressure is a barrier for adding ARNI.  Difficult to add beta-blocker since she is on high-dose Precedex.  Acute hypoxic respiratory failure from cardiac arrest, CHF, Metapneumovirus pneumonia, HCAP, muscle weakness from myotonic dystrophy. S/p tracheostomy on 3/25 Hospital-acquired pneumonia SLP consult for PMV Trach collar as much as tolerated.  Try 24 hours trach collar today Finished course of Cefepime.  BAL cultures from 3/25 shows normal flora   Acute metabolic encephalopathy from anoxia, sepsis. -Difficulty weaning off of Precedex due to agitations and anxiety.  -Currently on Seroquel 25 mg BID PRN.  Will make as scheduled. -If unable to wean off of Precedex, may Klonopin as well -Psychiatry consulted on 3/27 to help with mood, anxiety but patient would not engage.  Will reconsult psychiatry today per their recommendation   Hyperglycemia. SSI   GERD with esophagitis. Continue PPI   Hypothyroidism. Continue levothyroxine   Constipation. Bowel regimen  Anemia of iron deficiency, critical illness and chronic disease. f/u CBC Transfuse for Hb < 7 Given Ferrlecit on Sunday 3/24 for iron supplementation  Hypophosphatemia. Continue replacement  Severe malnutrition, present on admission Continue tube feeds  Disposition Patient will not be a good candidate for inpatient rehab due to the intensive therapy.  LTAC is a good option for her and family is considering it.  Best Practice: (right click and "Reselect all SmartList Selections" daily)   Diet/type: tubefeeds DVT prophylaxis: Lovenox GI prophylaxis: PPI Lines:NA Foley:  Yes, and it is still needed Code Status:  voiding trial today Last date of multidisciplinary goals of care discussion [updated her sister at bedside].   Signature:   Gaylan Gerold, DO Internal  Medicine Residency My pager: [phone]

## 2022-12-01 NOTE — Progress Notes (Signed)
Physical Therapy Treatment Patient Details Name: Cynthia Underwood MRN: [id number] DOB: 1971-01-17 Today's Date: 12/01/2022   History of Present Illness 52 y.o. admitted 3/9 with Left lower lobe community-acquired pneumonia, Acute hypoxic respiratory failure.   Pt with Vfib arrest on 3/12 with CPR and VDRF wtih transfer to ICU. Extubated 3/15 and re-intubated 3/16. s/p trach 3/25. PMH includes hypothyroidism, RBBB/high-grade heart block (s/p PPM),  sickle cell trait, myotonic dystrophy (dx age 9), tobacco use    PT Comments    Pt coughing with secretions upon PT arrival to room, pt's sister at bedside and states she has been coughing on and off since PMV trial. Pt agreeable to OOB attempt, requiring less physical assist +2 for to/from EOB this date vs previous sessions. Pt tolerating EOB sitting at supervision level at least for 10 minutes, limited by coughing, copious secretions to the point of dry heaving and emesis. This was the main limiting factor this date, PT feels pt will continue to progress well with intensive therapies.   VSS, including BP this date      Recommendations for follow up therapy are one component of a multi-disciplinary discharge planning process, led by the attending physician.  Recommendations may be updated based on patient status, additional functional criteria and insurance authorization.  Follow Up Recommendations       Assistance Recommended at Discharge Frequent or constant Supervision/Assistance  Patient can return home with the following A lot of help with walking and/or transfers;A lot of help with bathing/dressing/bathroom;Assistance with cooking/housework;Assist for transportation;Help with stairs or ramp for entrance   Equipment Recommendations  Other (comment) (defer)    Recommendations for Other Services       Precautions / Restrictions Precautions Precautions: Fall Precaution Comments: trach collar (12L/40%), cortrak Restrictions Weight  Bearing Restrictions: No     Mobility  Bed Mobility Overal bed mobility: Needs Assistance Bed Mobility: Supine to Sit, Sit to Supine     Supine to sit: Mod assist, +2 for physical assistance Sit to supine: Mod assist, +2 for physical assistance   General bed mobility comments: mod +2 for trunk and LEs, cues for sequencing and pt initiating well    Transfers                        Ambulation/Gait                   Stairs             Wheelchair Mobility    Modified Rankin (Stroke Patients Only)       Balance Overall balance assessment: Needs assistance Sitting-balance support: No upper extremity supported, Feet supported, Bilateral upper extremity supported Sitting balance-Leahy Scale: Fair Sitting balance - Comments: able to sit EOB 10+ minutes no physical assist until pt leans too far anteriorly, light assist to correct                                    Cognition Arousal/Alertness: Awake/alert Behavior During Therapy: WFL for tasks assessed/performed Overall Cognitive Status: Difficult to assess                                 General Comments: follows one-step commands with delay, communicates with writing and gesturing (PMV trials started today)        Exercises  General Comments General comments (skin integrity, edema, etc.): trach collar 40%/12LO2, VSS incuding BP with no reports of dizziness. Pt limited in progression by n/v at EOB      Pertinent Vitals/Pain Pain Assessment Pain Assessment: Faces Faces Pain Scale: Hurts little more Pain Location: throat Pain Descriptors / Indicators: Other (Comment) (pt wrote "feels like fire" after vomiting) Pain Intervention(s): Monitored during session, Limited activity within patient's tolerance    Home Living                          Prior Function            PT Goals (current goals can now be found in the care plan section) Acute Rehab  PT Goals Patient Stated Goal: get well PT Goal Formulation: With patient Time For Goal Achievement: 12/07/22 Potential to Achieve Goals: Good Progress towards PT goals: Progressing toward goals    Frequency    Min 3X/week      PT Plan Current plan remains appropriate    Co-evaluation              AM-PAC PT "6 Clicks" Mobility   Outcome Measure  Help needed turning from your back to your side while in a flat bed without using bedrails?: A Lot Help needed moving from lying on your back to sitting on the side of a flat bed without using bedrails?: A Lot Help needed moving to and from a bed to a chair (including a wheelchair)?: Total Help needed standing up from a chair using your arms (e.g., wheelchair or bedside chair)?: Total Help needed to walk in hospital room?: Total Help needed climbing 3-5 steps with a railing? : Total 6 Click Score: 8    End of Session Equipment Utilized During Treatment: Other (comment) (trach collar) Activity Tolerance: Patient limited by fatigue Patient left: in bed;with call bell/phone within reach;with bed alarm set;with family/visitor present;with SCD's reapplied Nurse Communication: Mobility status PT Visit Diagnosis: Unsteadiness on feet (R26.81);Other abnormalities of gait and mobility (R26.89);Muscle weakness (generalized) (M62.81)     Time: 1440-1510 PT Time Calculation (min) (ACUTE ONLY): 30 min  Charges:  $Therapeutic Activity: 8-22 mins $Neuromuscular Re-education: 8-22 mins                     Stacie Glaze, PT DPT Acute Rehabilitation Services Pager [phone]  Office [phone]    [name] 12/01/2022, 4:26 PM

## 2022-12-01 NOTE — Consult Note (Addendum)
Face-to-Face Psychiatry Consult   Reason for Consult: Concerns for depression and anxiety Referring Physician: Dr. Alfonse Spruce Patient Identification: Cynthia Underwood MRN:  [id number] Principal Diagnosis: Acute hypoxemic respiratory failure Diagnosis:  Principal Problem:   Acute hypoxemic respiratory failure ([place]) Active Problems:   CAP (community acquired pneumonia)   Cardiac arrest ([place])   Acute on chronic combined systolic and diastolic CHF (congestive heart failure) ([place])   Protein-calorie malnutrition, severe   Acute systolic CHF (congestive heart failure) ([place])   Drug-induced torsades de pointes ([place])   Acute systolic heart failure ([place])  Total Time Spent in Direct Patient Care:  I personally spent 55 minutes on the unit in direct patient care. The direct patient care time included face-to-face time with the patient, reviewing the patient's chart, communicating with other professionals, and coordinating care. Greater than 50% of this time was spent in counseling or coordinating care with the patient regarding goals of hospitalization, psycho-education, and discharge planning needs.  HPI:  Cynthia Underwood is a 52 y.o. female patient admitted with biventricular heart failure following cardiac arrest and resuscitation while hospitalized for pneumonia and hypoxic respiratory failure. Chart is reviewed and collateral obtained from patient's sister, Deedra Ehrich. Patient participated in assessment appropriately with yes no answers and hand gestures as she was wearing a trach collar at time of assessment and unable to speak.  Patient did have a rating board, but had difficulty with writing which caused her some frustration. Psychiatry was initially evaluated for ICU delirium, and she has been continued on Seroquel 25 mg twice daily. Today, on assessment patient is awake and alert.  She smiles and is able to answer yes/no questions appropriately.  Patient specifically denies any SI, HI, AVH.  Patient  gestures that she has had a small amount of hallucinations, but is unable to describe these.  Is also uncertain as to when she last had any hallucinations.  I suspect that this was during her delirium. I spoke with sister by phone.  Sister has been present with patient today.  Sister is pleased with her progress, noting that she is not agitated and is actively participating in her self-care.  Sister states that she was worried about patient's memory following her cardiac arrest, but now believes that patient's memory was coming and going while on Versed. Sister denies any past psychiatric concerns for patient.  She does worry that patient may possibly develop some depression, but denies concerns for patient being depressed at this time.  She states that her sister would be unlikely to want to take medication for depression, but believes that she would be interested in doing psychotherapy.  Sister requests that patient's overall medication burden be decreased as it is able.  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   Sickle cell trait ([place])    Watery eyes    Left   Weakness of both legs     Past Surgical History:  Procedure Laterality Date   BREAST BIOPSY Right    EYE SURGERY Left 2017   "related to blockage  in my nose"   EYE SURGERY Right 05/2019   INSERT / REPLACE / REMOVE PACEMAKER  02/15/2017   PACEMAKER IMPLANT N/A 02/15/2017   Procedure: Pacemaker Implant;  Surgeon: Deboraha Sprang, MD;  Location: [facility];  Service: Cardiovascular;  Laterality: N/A;   RIGHT/LEFT HEART CATH AND  CORONARY ANGIOGRAPHY N/A 11/16/2022   Procedure: RIGHT/LEFT HEART CATH AND CORONARY ANGIOGRAPHY;  Surgeon: Larey Dresser, MD;  Location: [facility];  Service: Cardiovascular;  Laterality: N/A;   THYROIDECTOMY N/A 04/21/2021   Procedure: TOTAL THYROIDECTOMY;  Surgeon: Ralene Ok, MD;  Location: [facility];  Service: General;  Laterality: N/A;   Family History:  Family History  Problem Relation Age of Onset   Cancer Father 28       Deceased   Heart disease Father    COPD Mother    Diabetes Maternal Uncle    Heart disease Maternal Uncle    Heart disease Paternal Grandmother    Diabetes Paternal Grandmother    Hypertension Paternal Grandmother    Neuromuscular disorder Maternal Aunt        Myotonic dystrophy   Neuromuscular disorder Cousin        Myotonic dystrophy   Neuromuscular disorder Sister        Myotonic dystrophy   Colon cancer Neg Hx    Colon polyps Neg Hx    Esophageal cancer Neg Hx    Rectal cancer Neg Hx    Stomach cancer Neg Hx    Family Psychiatric  History: None  Social History:  Social History   Substance and Sexual Activity  Alcohol Use Not Currently   Alcohol/week: 0.0 standard drinks of alcohol   Comment:  few times a year     Social History   Substance and Sexual Activity  Drug Use Not Currently   Types: Marijuana   Comment: occ     Social History   Socioeconomic History   Marital status: Single    Spouse name: Not on file   Number of children: 0   Years of education: 1.5 Collge   Highest education level: Not on file  Occupational History   Occupation: Unemployed    Employer: OTHER  Tobacco Use   Smoking status: Every Day    Years: 28    Types: Cigarettes    Last attempt to quit: 10/04/2016    Years since quitting: 6.1   Smokeless tobacco: Never   Tobacco comments:    3 cigarettes a day  Vaping Use   Vaping Use: Never used  Substance and Sexual Activity   Alcohol use: Not Currently    Alcohol/week: 0.0 standard drinks of  alcohol    Comment:  few times a year   Drug use: Not Currently    Types: Marijuana    Comment: occ    Sexual activity: Not Currently    Birth control/protection: None, Condom  Other Topics Concern   Not on file  Social History Narrative   Patient lives at home with her aunt.   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  She moved to [facility] in August 2015.   She has been on disability since 2010.   Education: 1 1/2 years of college.   Caffeine - coke 2 cans/day   Exercise - no   Social Determinants of Health   Financial Resource Strain: Low Risk  (10/16/2022)   Overall Financial Resource Strain (CARDIA)    Difficulty of Paying Living Expenses: Not hard  at all  Food Insecurity: No Food Insecurity (10/16/2022)   Hunger Vital Sign    Worried About Running Out of Food in the Last Year: Never true    Ran Out of Food in the Last Year: Never true  Transportation Needs: No Transportation Needs (10/16/2022)   PRAPARE - Hydrologist (Medical): No    Lack of Transportation (Non-Medical): No  Physical Activity: Inactive (10/16/2022)   Exercise Vital Sign    Days of Exercise per Week: 0 days    Minutes of Exercise per Session: 0 min  Stress: Stress Concern Present (10/16/2022)   [place]    Feeling of Stress : To some extent  Social Connections: Socially Isolated (10/16/2022)   Social Connection and Isolation Panel [NHANES]    Frequency of Communication with Friends and Family: Three times a week    Frequency of Social Gatherings with Friends and Family: Three times a week    Attends Religious Services: Never    Active Member of Clubs or Organizations: No    Attends Archivist Meetings: Never    Marital Status: Never married   Additional Social History:    Allergies:   Allergies  Allergen Reactions   Penicillin G Other (See Comments), Rash and Nausea And Vomiting     Sore throat   Penicillins Nausea And Vomiting, Rash and Other (See Comments)    Fever Has patient had a PCN reaction causing immediate rash, facial/tongue/throat swelling, SOB or lightheadedness with hypotension: Yes Has patient had a PCN reaction causing severe rash involving mucus membranes or skin necrosis: Unknown Has patient had a PCN reaction that required hospitalization: No Has patient had a PCN reaction occurring within the last 10 years: No If all of the above answers are "NO", then may proceed with Cephalosporin use.     Labs:  Results for orders placed or performed during the hospital encounter of 11/11/22 (from the past 48 hour(s))  Glucose, capillary     Status: Abnormal   Collection Time: 11/29/22  7:45 PM  Result Value Ref Range   Glucose-Capillary 132 (H) 70 - 99 mg/dL    Comment: Glucose reference range applies only to samples taken after fasting for at least 8 hours.  Phosphorus     Status: Abnormal   Collection Time: 11/29/22  8:22 PM  Result Value Ref Range   Phosphorus 2.0 (L) 2.5 - 4.6 mg/dL    Comment: Performed at [facility] [address] [place], [place] [address]  Glucose, capillary     Status: Abnormal   Collection Time: 11/29/22 11:43 PM  Result Value Ref Range   Glucose-Capillary 121 (H) 70 - 99 mg/dL    Comment: Glucose reference range applies only to samples taken after fasting for at least 8 hours.  Glucose, capillary     Status: Abnormal   Collection Time: 11/30/22  3:52 AM  Result Value Ref Range   Glucose-Capillary 131 (H) 70 - 99 mg/dL    Comment: Glucose reference range applies only to samples taken after fasting for at least 8 hours.  Basic metabolic panel     Status: Abnormal   Collection Time: 11/30/22  4:11 AM  Result Value Ref Range   Sodium 134 (L) 135 - 145 mmol/L   Potassium 3.7 3.5 - 5.1 mmol/L   Chloride 99 98 - 111 mmol/L   CO2 25 22 - 32 mmol/L   Glucose, Bld  134 (H) 70 - 99 mg/dL    Comment: Glucose reference  range applies only to samples taken after fasting for at least 8 hours.   BUN 13 6 - 20 mg/dL   Creatinine, Ser 0.50 0.44 - 1.00 mg/dL   Calcium 8.5 (L) 8.9 - 10.3 mg/dL   GFR, Estimated >60 >60 mL/min    Comment: (NOTE) Calculated using the CKD-EPI Creatinine Equation (2021)    Anion gap 10 5 - 15    Comment: Performed at [facility] [address], [place], [place] [address]  CBC     Status: Abnormal   Collection Time: 11/30/22  4:11 AM  Result Value Ref Range   WBC 10.0 4.0 - 10.5 K/uL   RBC 2.83 (L) 3.87 - 5.11 MIL/uL   Hemoglobin 9.0 (L) 12.0 - 15.0 g/dL   HCT 26.9 (L) 36.0 - 46.0 %   MCV 95.1 80.0 - 100.0 fL   MCH 31.8 26.0 - 34.0 pg   MCHC 33.5 30.0 - 36.0 g/dL   RDW 16.1 (H) 11.5 - 15.5 %   Platelets 486 (H) 150 - 400 K/uL   nRBC 0.0 0.0 - 0.2 %    Comment: Performed at [facility] [address], [place], [place] [address]  Phosphorus     Status: None   Collection Time: 11/30/22  4:11 AM  Result Value Ref Range   Phosphorus 3.2 2.5 - 4.6 mg/dL    Comment: Performed at [facility] [address] [place], [place] [address]  Glucose, capillary     Status: Abnormal   Collection Time: 11/30/22  7:53 AM  Result Value Ref Range   Glucose-Capillary 145 (H) 70 - 99 mg/dL    Comment: Glucose reference range applies only to samples taken after fasting for at least 8 hours.  Glucose, capillary     Status: Abnormal   Collection Time: 11/30/22 12:13 PM  Result Value Ref Range   Glucose-Capillary 121 (H) 70 - 99 mg/dL    Comment: Glucose reference range applies only to samples taken after fasting for at least 8 hours.  Glucose, capillary     Status: Abnormal   Collection Time: 11/30/22  3:33 PM  Result Value Ref Range   Glucose-Capillary 127 (H) 70 - 99 mg/dL    Comment: Glucose reference range applies only to samples taken after fasting for at least 8 hours.  Glucose, capillary     Status: Abnormal   Collection Time: 11/30/22  7:29 PM  Result Value  Ref Range   Glucose-Capillary 118 (H) 70 - 99 mg/dL    Comment: Glucose reference range applies only to samples taken after fasting for at least 8 hours.  Glucose, capillary     Status: Abnormal   Collection Time: 11/30/22 11:18 PM  Result Value Ref Range   Glucose-Capillary 117 (H) 70 - 99 mg/dL    Comment: Glucose reference range applies only to samples taken after fasting for at least 8 hours.  Glucose, capillary     Status: Abnormal   Collection Time: 12/01/22  3:22 AM  Result Value Ref Range   Glucose-Capillary 132 (H) 70 - 99 mg/dL    Comment: Glucose reference range applies only to samples taken after fasting for at least 8 hours.  Glucose, capillary     Status: Abnormal   Collection Time: 12/01/22  7:37 AM  Result Value Ref Range   Glucose-Capillary 135 (H) 70 - 99 mg/dL    Comment:  Glucose reference range applies only to samples taken after fasting for at least 8 hours.  Glucose, capillary     Status: Abnormal   Collection Time: 12/01/22 10:58 AM  Result Value Ref Range   Glucose-Capillary 127 (H) 70 - 99 mg/dL    Comment: Glucose reference range applies only to samples taken after fasting for at least 8 hours.  CBC     Status: Abnormal   Collection Time: 12/01/22 11:05 AM  Result Value Ref Range   WBC 10.4 4.0 - 10.5 K/uL   RBC 3.15 (L) 3.87 - 5.11 MIL/uL   Hemoglobin 10.0 (L) 12.0 - 15.0 g/dL   HCT 30.7 (L) 36.0 - 46.0 %   MCV 97.5 80.0 - 100.0 fL   MCH 31.7 26.0 - 34.0 pg   MCHC 32.6 30.0 - 36.0 g/dL   RDW 16.4 (H) 11.5 - 15.5 %   Platelets 583 (H) 150 - 400 K/uL   nRBC 0.2 0.0 - 0.2 %    Comment: Performed at [facility] [address] [place], [place] [id number]  Basic metabolic panel     Status: Abnormal   Collection Time: 12/01/22 11:05 AM  Result Value Ref Range   Sodium 136 135 - 145 mmol/L   Potassium 4.2 3.5 - 5.1 mmol/L   Chloride 98 98 - 111 mmol/L   CO2 25 22 - 32 mmol/L   Glucose, Bld 138 (H) 70 - 99 mg/dL    Comment: Glucose reference  range applies only to samples taken after fasting for at least 8 hours.   BUN 13 6 - 20 mg/dL   Creatinine, Ser 0.59 0.44 - 1.00 mg/dL   Calcium 9.3 8.9 - 10.3 mg/dL   GFR, Estimated >60 >60 mL/min    Comment: (NOTE) Calculated using the CKD-EPI Creatinine Equation (2021)    Anion gap 13 5 - 15    Comment: Performed at [facility] [address], [place], [place] [address]  Glucose, capillary     Status: Abnormal   Collection Time: 12/01/22  3:39 PM  Result Value Ref Range   Glucose-Capillary 106 (H) 70 - 99 mg/dL    Comment: Glucose reference range applies only to samples taken after fasting for at least 8 hours.    Current Facility-Administered Medications  Medication Dose Route Frequency Provider Last Rate Last Admin   0.9 %  sodium chloride infusion  250 mL Intravenous PRN Larey Dresser, MD 10 mL/hr at 12/01/22 1600 Infusion Verify at 12/01/22 1600   acetaminophen (TYLENOL) tablet 650 mg  650 mg Per Tube Q6H PRN Lestine Mount, PA-C   650 mg at 12/01/22 [id number]   Chlorhexidine Gluconate Cloth 2 % PADS 6 each  6 each Topical Daily Olalere, Adewale A, MD   6 each at 11/29/22 2130   dexmedetomidine (PRECEDEX) 400 MCG/100ML (4 mcg/mL) infusion  0-1.8 mcg/kg/hr Intravenous Continuous Mannam, Praveen, MD 5.56 mL/hr at 12/01/22 1600 0.5 mcg/kg/hr at 12/01/22 1600   diphenhydrAMINE (BENADRYL) injection 25 mg  25 mg Intravenous Q6H PRN [name], MD   25 mg at 12/01/22 1622   docusate (COLACE) 50 MG/5ML liquid 100 mg  100 mg Per Tube BID PRN Sanjuan Dame, MD       enoxaparin (LOVENOX) injection 30 mg  30 mg Subcutaneous Q24H Collene Gobble, MD   30 mg at 12/01/22 1008   feeding supplement (OSMOLITE 1.5 CAL) liquid 1,000 mL  1,000 mL Per Tube Continuous Mannam, Hart Robinsons, MD 50  mL/hr at 12/01/22 1600 Infusion Verify at 12/01/22 1600   feeding supplement (PROSource TF20) liquid 60 mL  60 mL Per Tube Daily Collene Gobble, MD   60 mL at 12/01/22 1007   fiber supplement  (BANATROL TF) liquid 60 mL  60 mL Per Tube TID Mannam, Praveen, MD   60 mL at 12/01/22 1622   guaiFENesin (ROBITUSSIN) 100 MG/5ML liquid 5 mL  5 mL Per Tube Q4H PRN Lestine Mount, PA-C   5 mL at 11/26/22 2041   insulin aspart (novoLOG) injection 0-9 Units  0-9 Units Subcutaneous Q4H [place] Crane M, PA-C   1 Units at 12/01/22 1239   ipratropium-albuterol (DUONEB) 0.5-2.5 (3) MG/3ML nebulizer solution 3 mL  3 mL Nebulization Q4H PRN Donne Hazel, MD   3 mL at 11/18/22 1910   levothyroxine (SYNTHROID) tablet 112 mcg  112 mcg Per Tube QAC breakfast [place] Crane M, PA-C   112 mcg at 12/01/22 0546   lidocaine (XYLOCAINE) 2 % viscous mouth solution 15 mL  15 mL Mouth/Throat Q6H PRN Chesley Mires, MD   15 mL at 11/26/22 2041   lip balm (CARMEX) ointment   Topical PRN Chesley Mires, MD   75 Application at [id number] 2057   melatonin tablet 5 mg  5 mg Per Tube QHS PRN Lestine Mount, PA-C   5 mg at 11/30/22 2124   midazolam (VERSED) injection 1-2 mg  1-2 mg Intravenous Q1H PRN Lestine Mount, PA-C   2 mg at 12/01/22 0108   Oral care mouth rinse  15 mL Mouth Rinse Q2H Chesley Mires, MD   15 mL at 12/01/22 1615   Oral care mouth rinse  15 mL Mouth Rinse PRN Chesley Mires, MD       oxyCODONE (Oxy IR/ROXICODONE) immediate release tablet 5 mg  5 mg Per Tube Q6H PRN Gaylan Gerold, DO       pantoprazole (PROTONIX) injection 40 mg  40 mg Intravenous QHS [place] Crane M, PA-C   40 mg at 11/30/22 2124   polyethylene glycol (MIRALAX / GLYCOLAX) packet 17 g  17 g Per Tube Daily PRN Sanjuan Dame, MD       QUEtiapine (SEROQUEL) tablet 25 mg  25 mg Per Tube BID PRN Lavella Hammock, MD       scopolamine (TRANSDERM-SCOP) 1 MG/3DAYS 1.5 mg  1 patch Transdermal Q72H Gaylan Gerold, DO   1.5 mg at 11/30/22 1729   sodium chloride flush (NS) 0.9 % injection 10-40 mL  10-40 mL Intracatheter Q12H Mannam, Praveen, MD   10 mL at 12/01/22 1011   sodium chloride flush (NS) 0.9 % injection 10-40 mL  10-40 mL  Intracatheter PRN Mannam, Praveen, MD       sodium chloride flush (NS) 0.9 % injection 3 mL  3 mL Intravenous Q12H Larey Dresser, MD   3 mL at 12/01/22 1010   sodium chloride flush (NS) 0.9 % injection 3 mL  3 mL Intravenous PRN Larey Dresser, MD   3 mL at 12/01/22 1010   spironolactone (ALDACTONE) tablet 25 mg  25 mg Per Tube Daily Lyda Jester M, PA-C   25 mg at 12/01/22 1008    Musculoskeletal: Strength & Muscle Tone: decreased Gait & Station:  Not assessed, patient in bed Patient leans: N/A   Psychiatric Specialty Exam:  Presentation  General Appearance:  Appropriate for Environment  Eye Contact: Good  Speech: -- (wearing a trach collar)  Speech Volume: -- (wearing a trach collar)  Handedness:No data recorded  Mood and Affect  Mood: Euthymic  Affect: Appropriate   Thought Process  Thought Processes: Coherent  Descriptions of Associations:-- (Unable to assess)  Orientation:Full (Time, Place and Person)  Thought Content:-- (unable to assess)  History of Schizophrenia/Schizoaffective disorder:No data recorded Duration of Psychotic Symptoms:No data recorded Hallucinations:Hallucinations: -- (Vague, notes with her fingers a small amount but is not able to express when these last heard.)  Ideas of Reference:-- (Unable to assess)  Suicidal Thoughts:Suicidal Thoughts: No  Homicidal Thoughts:Homicidal Thoughts: No   Sensorium  Memory: -- (Per sister, memory has been improving since she is off of Versed)  Judgment: -- (Unable to assess)  Insight: -- (Unable to assess)   Executive Functions  Concentration: Good  Attention Span: Good  Recall: -- (Unable to assess)  Fund of Knowledge: -- (Unable to assess)  Language: -- (Wearing a trach collar in currently on verbal)   Psychomotor Activity  Psychomotor Activity: Psychomotor Activity: Normal   Assets  Assets: Desire for Improvement; Social Support   Sleep   Sleep: Sleep: Good (Per documentation and sister's report)   Physical Exam: Physical Exam Vitals and nursing note reviewed.  Constitutional:      Appearance: Normal appearance.  Pulmonary:     Effort: Pulmonary effort is normal. No respiratory distress.     Comments: On trach collar Neurological:     Mental Status: She is alert.    Review of Systems  Unable to perform ROS: Critical illness   Blood pressure 106/68, pulse 77, temperature (!) 97 F (36.1 C), temperature source Axillary, resp. rate (!) 27, height 5\' 3"  (1.6 m), weight 41.4 kg, last menstrual period 12/03/2016, SpO2 98 %. Body mass index is 16.17 kg/m.  Treatment Plan Summary: Medication management Change Seroquel to 25 mg twice daily as needed for agitation. There is some vague description by patient of hallucinations, but uncertain as to when this last occurred.  I suspect these were related to her ICU delirium which has significantly improved since she is off of Versed. Agree with plan to minimize medications as able.  Antipsychotics to carry a risk of increasing QTc in a patient that has had a recent ventricular tachycardia event. In addition, antidepressants could also affect QTc.  From collateral information obtained from patient's sister, Deedra Ehrich, patient does not appear depressed and historically would not want psychotropic medications.  Patient could benefit from psychotherapy in the setting of recent medical trauma.  This can be arranged as an outpatient following her hospitalization.   Disposition: No evidence of imminent risk to self or others at present.   Patient does not meet criteria for psychiatric inpatient admission. Supportive therapy provided about ongoing stressors.  Psychiatry will sign off.  Please re-consult for any future acute psychiatric concerns.     Lavella Hammock, MD 12/01/2022 6:24 PM

## 2022-12-01 NOTE — Evaluation (Signed)
[facility] Speaking Valve - Evaluation Patient Details  Name: Cynthia Underwood MRN: [id number] Date of Birth: 05-01-1971  Today's Date: 12/01/2022 Time: [id number] SLP Time Calculation (min) (ACUTE ONLY): 29 min  Past Medical History:  Past Medical History:  Diagnosis Date   Bifascicular block 10/02/2014   Cataract    Dyspnea    Excess ear wax    Multinodular goiter    Pneumonia    Presence of permanent cardiac pacemaker    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   Sickle cell trait ([place])    Watery eyes    Left   Weakness of both legs    Past Surgical History:  Past Surgical History:  Procedure Laterality Date   BREAST BIOPSY Right    EYE SURGERY Left 2017   "related to blockage in my nose"   EYE SURGERY Right 05/2019   INSERT / REPLACE / REMOVE PACEMAKER  02/15/2017   PACEMAKER IMPLANT N/A 02/15/2017   Procedure: Pacemaker Implant;  Surgeon: Deboraha Sprang, MD;  Location: [facility];  Service: Cardiovascular;  Laterality: N/A;   RIGHT/LEFT HEART CATH AND CORONARY ANGIOGRAPHY N/A 11/16/2022   Procedure: RIGHT/LEFT HEART CATH AND CORONARY ANGIOGRAPHY;  Surgeon: Larey Dresser, MD;  Location: [facility];  Service: Cardiovascular;  Laterality: N/A;   THYROIDECTOMY N/A 04/21/2021   Procedure: TOTAL THYROIDECTOMY;  Surgeon: Ralene Ok, MD;  Location: [facility];  Service: General;  Laterality: N/A;   HPI:  52 y.o. admitted 3/9 with Left lower lobe community-acquired pneumonia, Acute hypoxic respiratory failure.   Pt with Vfib arrest on 3/12 with CPR and VDRF wtih transfer to ICU. Extubated 3/15 and re-intubated 3/16. s/p trach 3/25. PMH includes hypothyroidism, RBBB/high-grade heart block (s/p PPM),  sickle cell trait, myotonic dystrophy (dx age 47), tobacco use.    Assessment / Plan / Recommendation  Clinical Impression  Pt seen for PMV evaluation on trach collar with sister at bedside and cuff deflated on arrival.  When valve donned pt able to cough and orally expectorate mild amount of secretions. Patent airway and pt exhaling and blowing air through pursed lips. Valve remained on trach hub throughout evaluation and when removed there were no indications of back pressure. Pt is deconditioned, still with some sedating meds (MD trying to wean) and reduced respiratory effort resulting in vocal attempts that are whispered with use of more false vocal folds and hypohonia. When she was cued to take deeper breath, push against therapists hand and speak loud ("yell") phonation with use of true vocal cords was slightly audible on 2 attempts. Her RR 25, SpO2 97% and HR 71. Once sedating meds are reduced and she gains strength feel she will be able to achieve audible phonation more consistently and able to use strategies to facilitate. Pt may use valve with staff/therapy, checking for cuff deflation with FULL supervision and remove when sleeping. ST will continue to work with pt toward respiratory support, phonation with use of speaking valve. SLP Visit Diagnosis: Aphonia (R49.1)    SLP Assessment  Patient needs continued Speech [facility]    Recommendations for follow up therapy are one component of a multi-disciplinary discharge planning process, led by the attending physician.  Recommendations may be updated based on patient status, additional functional criteria and insurance authorization.  Follow Up Recommendations  SLP at Long-term acute care hospital    Assistance Recommended at Discharge Frequent or constant Supervision/Assistance  Functional Status Assessment Patient has had  a recent decline in their functional status and demonstrates the ability to make significant improvements in function in a reasonable and predictable amount of time.  Frequency and Duration min 2x/week  2 weeks    PMSV Trial PMSV was placed for: 23 min Able to redirect subglottic air through upper airway: Yes Able to  Attain Phonation: Yes Voice Quality: Hoarse;Low vocal intensity Able to Expectorate Secretions: Yes Level of Secretion Expectoration with PMSV: Oral Breath Support for Phonation: Moderately decreased Intelligibility: Intelligibility reduced Word: 50-74% accurate Phrase: Not tested Respirations During Trial:  (25-27) SpO2 During Trial: 97 % Pulse During Trial: 71 Behavior: Alert;Controlled;Cooperative;Responsive to questions   Tracheostomy Tube       Vent Dependency  FiO2 (%): 40 %    Cuff Deflation Trial Tolerated Cuff Deflation:  (deflated on arrival) Behavior: Alert;Controlled;Cooperative;Responsive to questions         Houston Siren 12/01/2022, 9:24 AM

## 2022-12-01 NOTE — Progress Notes (Signed)
Pt placed on ATC 12L/40% and is tolerating well. RT will monitor. 

## 2022-12-02 ENCOUNTER — Inpatient Hospital Stay (HOSPITAL_COMMUNITY): Payer: 59

## 2022-12-02 DIAGNOSIS — J9601 Acute respiratory failure with hypoxia: Secondary | ICD-10-CM | POA: Diagnosis not present

## 2022-12-02 LAB — GLUCOSE, CAPILLARY
Glucose-Capillary: 117 mg/dL — ABNORMAL HIGH (ref 70–99)
Glucose-Capillary: 120 mg/dL — ABNORMAL HIGH (ref 70–99)
Glucose-Capillary: 120 mg/dL — ABNORMAL HIGH (ref 70–99)
Glucose-Capillary: 122 mg/dL — ABNORMAL HIGH (ref 70–99)
Glucose-Capillary: 129 mg/dL — ABNORMAL HIGH (ref 70–99)
Glucose-Capillary: 145 mg/dL — ABNORMAL HIGH (ref 70–99)

## 2022-12-02 LAB — BASIC METABOLIC PANEL
Anion gap: 16 — ABNORMAL HIGH (ref 5–15)
BUN: 22 mg/dL — ABNORMAL HIGH (ref 6–20)
CO2: 23 mmol/L (ref 22–32)
Calcium: 9.8 mg/dL (ref 8.9–10.3)
Chloride: 98 mmol/L (ref 98–111)
Creatinine, Ser: 0.74 mg/dL (ref 0.44–1.00)
GFR, Estimated: >60 mL/min (ref >60)
Glucose, Bld: 117 mg/dL — ABNORMAL HIGH (ref 70–99)
Potassium: 3.5 mmol/L (ref 3.5–5.1)
Sodium: 137 mmol/L (ref 135–145)

## 2022-12-02 LAB — CBC
HCT: 33.7 % — ABNORMAL LOW (ref 36.0–46.0)
Hemoglobin: 11.1 g/dL — ABNORMAL LOW (ref 12.0–15.0)
MCH: 32.5 pg (ref 26.0–34.0)
MCHC: 32.9 g/dL (ref 30.0–36.0)
MCV: 98.5 fL (ref 80.0–100.0)
Platelets: 709 10*3/uL — ABNORMAL HIGH (ref 150–400)
RBC: 3.42 MIL/uL — ABNORMAL LOW (ref 3.87–5.11)
RDW: 16.3 % — ABNORMAL HIGH (ref 11.5–15.5)
WBC: 14.9 10*3/uL — ABNORMAL HIGH (ref 4.0–10.5)
nRBC: 0.1 % (ref 0.0–0.2)

## 2022-12-02 MED ORDER — FUROSEMIDE 10 MG/ML IJ SOLN
40.0000 mg | Freq: Once | INTRAMUSCULAR | Status: AC
  Administered 2022-12-02: 40 mg via INTRAVENOUS
  Filled 2022-12-02: qty 4

## 2022-12-02 MED ORDER — CLONAZEPAM 1 MG PO TABS
1.0000 mg | ORAL_TABLET | Freq: Two times a day (BID) | ORAL | Status: DC
  Administered 2022-12-02 – 2022-12-03 (×3): 1 mg
  Filled 2022-12-02 (×2): qty 1

## 2022-12-02 MED ORDER — CLONAZEPAM 1 MG PO TABS
1.0000 mg | ORAL_TABLET | Freq: Two times a day (BID) | ORAL | Status: DC
  Administered 2022-12-02: 1 mg via ORAL
  Filled 2022-12-02 (×2): qty 1

## 2022-12-02 MED ORDER — IBUPROFEN 200 MG PO TABS
400.0000 mg | ORAL_TABLET | Freq: Once | ORAL | Status: DC
  Filled 2022-12-02: qty 2

## 2022-12-02 MED ORDER — GUAIFENESIN-CODEINE 100-10 MG/5ML PO SOLN
5.0000 mL | ORAL | Status: DC | PRN
  Administered 2022-12-03: 5 mL
  Filled 2022-12-02: qty 5

## 2022-12-02 MED ORDER — QUETIAPINE FUMARATE 25 MG PO TABS
25.0000 mg | ORAL_TABLET | Freq: Every day | ORAL | Status: DC
  Filled 2022-12-02: qty 1

## 2022-12-02 MED ORDER — IBUPROFEN 200 MG PO TABS
400.0000 mg | ORAL_TABLET | Freq: Once | ORAL | Status: AC
  Administered 2022-12-02: 400 mg

## 2022-12-02 MED ORDER — QUETIAPINE FUMARATE 25 MG PO TABS
25.0000 mg | ORAL_TABLET | Freq: Every day | ORAL | Status: DC
  Administered 2022-12-02 – 2022-12-04 (×3): 25 mg
  Filled 2022-12-02 (×2): qty 1

## 2022-12-02 MED ORDER — POTASSIUM CHLORIDE 20 MEQ PO PACK
40.0000 meq | PACK | Freq: Once | ORAL | Status: AC
  Administered 2022-12-02: 40 meq
  Filled 2022-12-02: qty 2

## 2022-12-02 MED ORDER — QUETIAPINE FUMARATE 25 MG PO TABS: 25.0000 mg | ORAL_TABLET | Freq: Every day | ORAL | Status: DC | PRN

## 2022-12-02 NOTE — Progress Notes (Signed)
During assessment by this RT, patient was found on ATC 75%  w/ Spo2 85-89%. Pt also had what looked to be tube feed in her secretions. Pt returned to vent w/previous noted vent settings for the night to rest.  Pt appears more comfortable with  Spo2 of 95% on 50% Fio2. Will resume trach collar in am as able

## 2022-12-02 NOTE — Progress Notes (Signed)
Inpatient Rehab Admissions Coordinator:    CIR consult received. Pt.  Is currently not yet tolerating OOB and is not yet at a level where I think she could tolerate the intensity of CIR. However, pt. Does have potential to progress so I will monitor for progress and participation with therapies and pursue admit if Pt. Becomes an appropriate CIR candidate.   Clemens Catholic, [place], [place] [facility] Admissions Coordinator  [phone] ([place]) [phone] (office)

## 2022-12-02 NOTE — Progress Notes (Signed)
[facility] ADULT ICU REPLACEMENT PROTOCOL   The patient does apply for the [facility] Adult ICU Electrolyte Replacment Protocol based on the criteria listed below:   1.Exclusion criteria: TCTS, ECMO, Dialysis, and Myasthenia Gravis patients 2. Is GFR >/= 30 ml/min? Yes.    Patient's GFR today is >60 3. Is SCr </= 2? Yes.   Patient's SCr is 0.74 mg/dL 4. Did SCr increase >/= 0.5 in 24 hours? No. 5.Pt's weight >40kg  Yes.   6. Abnormal electrolyte(s): Potassium 3.5  7. Electrolytes replaced per protocol 8.  Call MD STAT for K+ </= 2.5, Phos </= 1, or Mag </= 1 Physician:  Dr. Harlon Flor A Fredi Hurtado 12/02/2022 4:40 AM

## 2022-12-02 NOTE — Progress Notes (Signed)
Overnight, the patient very anxious. Asking for the bedpan about every 15-30 minutes with no void. I ended up putting a purwick in place and with reassurance about placement to the patient. Prior to being put on the vent the patient had incessant coughing for about 4 hours. No desats or resp distress. The patient did not seem to respond to any of the PRNs- seroquel, melatonin, versed, robittussin. She seemed to respond best to the oxycodone for ear pain. Overnight she got about 2 hours of sleep total. Very restless requesting to be sat up to spit into the bedpan (per patient preference). Question at one point about cortrak placement and this was confirmed again with KUB so tube feed restarted.

## 2022-12-02 NOTE — Progress Notes (Signed)
NAME:  Cynthia Underwood, MRN:  [id number], DOB:  [birth year], LOS: 83 ADMISSION DATE:  11/11/2022 CONSULTATION DATE:  11/14/2022 REFERRING MD:  Wyline Copas - TRH CHIEF COMPLAINT:  Vfib arrest on floor   History of Present Illness:   52 year old woman who initially presented to [facility] 3/9 for cough and progressive dyspnea x 4 days. Also reported diarrhea and 1 episode of epigastric pain with vomiting. PMHx significant for RBBB/high-grade heart block (s/p PPM), hypothyroidism, sickle cell trait, myotonic dystrophy (dx age 84), tobacco use.  Noted to be hypoxic in ED with O2 sat 88% on RA, CTA Chest c/f LLL PNA with mild bronchiectasis, negative for PE. Also c/f esophagitis. Patient was empirically started on CAP coverage (ceftriaxone, azithromycin) and GERD treatment (PPI/sucralfate). Also found to be positive for human metapneumovirus. Admitted to [facility].  Patient was initially progressing as expected 3/11-3/12 until ~1400, sat was noted to be in 80s. Placed on 5L humidified oxygen with slight improvement in sats. Solumedrol and DuoNebs were ordered. Around 1900 patient's RN was informed by telemetry of VF and was found unresponsive and pulseless. Code Blue was called with CPR x 15 minutes and ROSC. Amiodarone, Epi and defib were administered. Patient was intubated by anesthesia peri-code. IO was placed and Levophed was initiated. Cardiology was consulted.  PCCM was consulted for ICU admission post-arrest.  Pertinent Medical History:  Heart block, Goiter, Sickle cell trait, Myotonic dystrophy  Significant Hospital Events: Including procedures, antibiotic start and stop dates in addition to other pertinent events   3/9 - Presented to [facility] for LLL PNA. Started on Surveyor, quantity. +human metapneumovirus. 3/12 - Hypoxic requiring 5LNC. Later VF arrest on floor, CPR 3/13 echocardiogram LVEF severely depressed 50% (decreased compared with 01/2021), severely reduced RV function 3/14 Cardiac catheterization showed  normal coronaries, new normal filling pressures and normal PA pressure.  With excellent cardiac output 3/15 Extubated.  3/16 Reintubated for increased work of breathing, failed BiPAP 3/18 alert, interactive off vasopressors 3/20 Wean, trial extubation today  3/21 No acute issues overnight, failed early am wean due to apnea on 50 Fentanyl and 0.8 Pecedex  3/24 PRBC transfusion 3/25 Percutaneous tracheostomy  Interim History / Subjective:   Attempted ATC overnight. Episode of emesis and coughing so returned to vent but not in respiratory distress When placed on ATC this am, thin but copious secretions seen by RT  Objective:  Blood pressure 106/86, pulse 80, temperature 98.8 F (37.1 C), temperature source Oral, resp. rate 18, height 5\' 3"  (1.6 m), weight 41.4 kg, last menstrual period 12/03/2016, SpO2 92 %.    Vent Mode: PRVC FiO2 (%):  [35 %-70 %] 50 % Set Rate:  [24 bmp] 24 bmp Vt Set:  [410 mL] 410 mL PEEP:  [5 cmH20] 5 cmH20   Intake/Output Summary (Last 24 hours) at 12/02/2022 0907 Last data filed at 12/02/2022 0400 Gross per 24 hour  Intake 1116.94 ml  Output 275 ml  Net 841.94 ml    Filed Weights   11/29/22 0400 11/30/22 0200 12/01/22 0500  Weight: 41.2 kg 39.9 kg 41.4 kg   Physical Exam: General: Chronically ill and cachectic-appearing, no acute distress HENT: [place], AT, OP clear, MMM Eyes: EOMI, no scleral icterus Respiratory: Diminished to auscultation bilaterally.  No crackles, wheezing or rales Cardiovascular: RRR, -M/R/G, no JVD GI: BS+, soft, nontender Extremities:-Edema,-tenderness Neuro: AAO x2, CNII-XII grossly intact  WBC increased to 14.9 Stable anemia    Resolved Hospital Problem List:  Septic shock from pneumonia, Cardiogenic shock from cardiac arrest, Shock  liver in setting of cardiac arrest  Assessment & Plan:  Post-Vfib arrest. Elevated troponin. Heart block s/p MDT PPM. New acute non ischemic systolic CHF with EF [id number]. Heart failure team is  following.  Spironolactone Lasix OTO for +CFB  Acute hypoxic respiratory failure from cardiac arrest, CHF, Metapneumovirus pneumonia, HCAP, muscle weakness from myotonic dystrophy. S/p tracheostomy on 3/25 Hospital-acquired pneumonia SLP consult for PMV Trach collar as tolerated. Re-try for 24 hours again S/p Cefepime.  BAL cultures from 3/25 shows normal flora   Acute metabolic encephalopathy from anoxia, sepsis. -Difficulty weaning off of Precedex due to agitations and anxiety.  -Seroquel 25 mg BID PRN per Psych -If unable to wean off of Precedex -Start klonopin 1 mg BID -Start Seroquel 25 mg nightly. Will monitor QTC -Appreciate psych input   Hyperglycemia. SSI   GERD with esophagitis. Continue PPI   Hypothyroidism. Continue levothyroxine   Constipation. Bowel regimen  Anemia of iron deficiency, critical illness and chronic disease. f/u CBC Transfuse for Hb < 7 Given Ferrlecit on Sunday 3/24 for iron supplementation  Hypophosphatemia. Continue replacement  Severe malnutrition, present on admission Continue tube feeds  Disposition Inpatient rehab referral ordered for evaluation  Best Practice: (right click and "Reselect all SmartList Selections" daily)   Diet/type: tubefeeds DVT prophylaxis: Lovenox GI prophylaxis: PPI Lines:NA Foley:  Yes, and it is still needed Code Status:  voiding trial today Last date of multidisciplinary goals of care discussion [updated sister at bedside].   Signature:    The patient is critically ill with acute on chronic respiratory failure and requires high complexity decision making for assessment and support, frequent evaluation and titration of therapies, application of advanced monitoring technologies and extensive interpretation of multiple databases.  Independent Critical Care Time: 35 Minutes.   Rodman Pickle, M.D. [facility] Pulmonary/Critical Care Medicine 12/02/2022 9:07 AM   Please see Amion for pager number to reach  on-call Pulmonary and Critical Care Team.

## 2022-12-02 NOTE — Progress Notes (Signed)
Pt placed back on ventilator with cuff up at 00:15 due to vomiting and inability to tolerate ATC.

## 2022-12-03 DIAGNOSIS — J9601 Acute respiratory failure with hypoxia: Secondary | ICD-10-CM | POA: Diagnosis not present

## 2022-12-03 LAB — GLUCOSE, CAPILLARY
Glucose-Capillary: 104 mg/dL — ABNORMAL HIGH (ref 70–99)
Glucose-Capillary: 106 mg/dL — ABNORMAL HIGH (ref 70–99)
Glucose-Capillary: 113 mg/dL — ABNORMAL HIGH (ref 70–99)
Glucose-Capillary: 117 mg/dL — ABNORMAL HIGH (ref 70–99)
Glucose-Capillary: 121 mg/dL — ABNORMAL HIGH (ref 70–99)
Glucose-Capillary: 127 mg/dL — ABNORMAL HIGH (ref 70–99)

## 2022-12-03 LAB — BASIC METABOLIC PANEL
Anion gap: 10 (ref 5–15)
BUN: 48 mg/dL — ABNORMAL HIGH (ref 6–20)
CO2: 25 mmol/L (ref 22–32)
Calcium: 9.2 mg/dL (ref 8.9–10.3)
Chloride: 101 mmol/L (ref 98–111)
Creatinine, Ser: 0.95 mg/dL (ref 0.44–1.00)
GFR, Estimated: >60 mL/min (ref >60)
Glucose, Bld: 120 mg/dL — ABNORMAL HIGH (ref 70–99)
Potassium: 5.1 mmol/L (ref 3.5–5.1)
Sodium: 136 mmol/L (ref 135–145)

## 2022-12-03 MED ORDER — CARBAMIDE PEROXIDE 6.5 % OT SOLN
5.0000 [drp] | Freq: Two times a day (BID) | OTIC | Status: DC | PRN
  Administered 2022-12-04: 5 [drp] via OTIC
  Filled 2022-12-03: qty 15

## 2022-12-03 MED ORDER — FUROSEMIDE 40 MG PO TABS
20.0000 mg | ORAL_TABLET | Freq: Every day | ORAL | Status: DC
  Administered 2022-12-03: 20 mg via ORAL
  Filled 2022-12-03: qty 1

## 2022-12-03 NOTE — Progress Notes (Signed)
NAME:  Cynthia Underwood, MRN:  [id number], DOB:  1971/02/21, LOS: 69 ADMISSION DATE:  11/11/2022 CONSULTATION DATE:  11/14/2022 REFERRING MD:  Wyline Copas - TRH CHIEF COMPLAINT:  Vfib arrest on floor   History of Present Illness:   52 year old woman who initially presented to [facility] 3/9 for cough and progressive dyspnea x 4 days. Also reported diarrhea and 1 episode of epigastric pain with vomiting. PMHx significant for RBBB/high-grade heart block (s/p PPM), hypothyroidism, sickle cell trait, myotonic dystrophy (dx age 33), tobacco use.  Noted to be hypoxic in ED with O2 sat 88% on RA, CTA Chest c/f LLL PNA with mild bronchiectasis, negative for PE. Also c/f esophagitis. Patient was empirically started on CAP coverage (ceftriaxone, azithromycin) and GERD treatment (PPI/sucralfate). Also found to be positive for human metapneumovirus. Admitted to [facility].  Patient was initially progressing as expected 3/11-3/12 until ~1400, sat was noted to be in 80s. Placed on 5L humidified oxygen with slight improvement in sats. Solumedrol and DuoNebs were ordered. Around 1900 patient's RN was informed by telemetry of VF and was found unresponsive and pulseless. Code Blue was called with CPR x 15 minutes and ROSC. Amiodarone, Epi and defib were administered. Patient was intubated by anesthesia peri-code. IO was placed and Levophed was initiated. Cardiology was consulted.  PCCM was consulted for ICU admission post-arrest.  Pertinent Medical History:  Heart block, Goiter, Sickle cell trait, Myotonic dystrophy  Significant Hospital Events: Including procedures, antibiotic start and stop dates in addition to other pertinent events   3/9 - Presented to [facility] for LLL PNA. Started on Surveyor, quantity. +human metapneumovirus. 3/12 - Hypoxic requiring 5LNC. Later VF arrest on floor, CPR 3/13 echocardiogram LVEF severely depressed 50% (decreased compared with 01/2021), severely reduced RV function 3/14 Cardiac catheterization showed  normal coronaries, new normal filling pressures and normal PA pressure.  With excellent cardiac output 3/15 Extubated.  3/16 Reintubated for increased work of breathing, failed BiPAP 3/18 alert, interactive off vasopressors 3/20 Wean, trial extubation today  3/21 No acute issues overnight, failed early am wean due to apnea on 50 Fentanyl and 0.8 Pecedex  3/24 PRBC transfusion 3/25 Percutaneous tracheostomy 3/29 ATC trials during the day  Interim History / Subjective:   Attempted TCT for >12 hours for last two days unsuccessful. Discussed rest period today Diuresed  Objective:  Blood pressure (!) 85/67, pulse 76, temperature 98.7 F (37.1 C), temperature source Oral, resp. rate (!) 24, height 5\' 3"  (1.6 m), weight 39.1 kg, last menstrual period 12/03/2016, SpO2 90 %.    Vent Mode: PRVC FiO2 (%):  [50 %-75 %] 50 % Set Rate:  [24 bmp] 24 bmp Vt Set:  [410 mL] 410 mL PEEP:  [5 cmH20] 5 cmH20 Plateau Pressure:  [16 cmH20-17 cmH20] 16 cmH20   Intake/Output Summary (Last 24 hours) at 12/03/2022 1136 Last data filed at 12/03/2022 1043 Gross per 24 hour  Intake 713.48 ml  Output 300 ml  Net 413.48 ml   Filed Weights   11/30/22 0200 12/01/22 0500 12/03/22 0339  Weight: 39.9 kg 41.4 kg 39.1 kg   Physical Exam: General: Chronically and cachectic-appearing, no acute distress HENT: [place], AT, OP clear, MMM Eyes: EOMI, no scleral icterus Respiratory: Diminished but clear to auscultation bilaterally.  No crackles, wheezing or rales Cardiovascular: RRR, -M/R/G, no JVD GI: BS+, soft, nontender Extremities:-Edema,-tenderness Neuro: AAO x2, CNII-XII grossly intact  BMET pending   Resolved Hospital Problem List:  Septic shock from pneumonia, Cardiogenic shock from cardiac arrest, Shock liver in setting of  cardiac arrest  Assessment & Plan:  Post-Vfib arrest. Elevated troponin. Heart block s/p MDT PPM. New acute non ischemic systolic CHF with EF 123456. Heart failure team is following.   Spironolactone Diurese as able. +CFB  Acute hypoxic respiratory failure from cardiac arrest, CHF, Metapneumovirus pneumonia, HCAP, muscle weakness from myotonic dystrophy. S/p tracheostomy on 3/25 Hospital-acquired pneumonia SLP consult for PMV Trach collar as tolerated. Rest on vent today and reattempt tomorrow S/p Cefepime.  BAL cultures from 3/25 shows normal flora   Acute metabolic encephalopathy from anoxia, sepsis. -Difficulty weaning off of Precedex due to agitation and anxiety.  -Seroquel 25 mg BID PRN per Psych -Start klonopin 1 mg BID 3/30 -Start Seroquel 25 mg nightly. Will monitor QTC -Appreciate psych input   Hyperglycemia. SSI   GERD with esophagitis. Continue PPI   Hypothyroidism. Continue levothyroxine   Constipation. Bowel regimen  Anemia of iron deficiency, critical illness and chronic disease. f/u CBC Transfuse for Hb < 7 Given Ferrlecit on Sunday 3/24 for iron supplementation  Hypophosphatemia. Continue replacement  Severe malnutrition, present on admission Continue tube feeds  Disposition Inpatient rehab referral ordered for evaluation  Best Practice: (right click and "Reselect all SmartList Selections" daily)   Diet/type: tubefeeds DVT prophylaxis: Lovenox GI prophylaxis: PPI Lines:NA Foley:  Yes, and it is still needed Code Status:  voiding trial today Last date of multidisciplinary goals of care discussion [updated sister at bedside].   Signature:    The patient is critically ill with respiratory failure and requires high complexity decision making for assessment and support, frequent evaluation and titration of therapies, application of advanced monitoring technologies and extensive interpretation of multiple databases.  Independent Critical Care Time: 40 Minutes.   Rodman Pickle, M.D. [facility] Pulmonary/Critical Care Medicine 12/03/2022 11:36 AM   Please see Amion for pager number to reach on-call Pulmonary and Critical Care  Team.

## 2022-12-03 NOTE — Progress Notes (Signed)
   12/03/22 2243  Spiritual Encounters  Type of Visit Initial  Care provided to: Patient  Conversation partners present during encounter Nurse  Referral source Nurse (RN/NT/LPN)  Reason for visit Routine spiritual support  OnCall Visit Yes  Spiritual Framework  Presenting Themes Courage hope and growth  Interventions  Spiritual Care Interventions Made Reflective listening;Compassionate presence;Prayer   Chaplain responded to call from RN that PT was struggling with a fear that she is dying. Chaplain arrived at PT room to discover she is unable to speak and uses a clip board with pen and paper to communicate, and writing is a slow and difficult process for the PT.  Despite the difficult Chaplain was able to form a connection with PT and converse about her fears.  Chaplain prayed for PT and spent time just offering commpassionate presence. PT would like to talk to a chaplain again tomorrow.

## 2022-12-04 DIAGNOSIS — J9601 Acute respiratory failure with hypoxia: Secondary | ICD-10-CM | POA: Diagnosis not present

## 2022-12-04 LAB — CBC WITH DIFFERENTIAL/PLATELET
Abs Immature Granulocytes: 0.06 10*3/uL (ref 0.00–0.07)
Basophils Absolute: 0.1 10*3/uL (ref 0.0–0.1)
Basophils Relative: 1 %
Eosinophils Absolute: 0.3 10*3/uL (ref 0.0–0.5)
Eosinophils Relative: 3 %
HCT: 35.7 % — ABNORMAL LOW (ref 36.0–46.0)
Hemoglobin: 11.4 g/dL — ABNORMAL LOW (ref 12.0–15.0)
Immature Granulocytes: 1 %
Lymphocytes Relative: 30 %
Lymphs Abs: 2.7 10*3/uL (ref 0.7–4.0)
MCH: 31.3 pg (ref 26.0–34.0)
MCHC: 31.9 g/dL (ref 30.0–36.0)
MCV: 98.1 fL (ref 80.0–100.0)
Monocytes Absolute: 0.7 10*3/uL (ref 0.1–1.0)
Monocytes Relative: 8 %
Neutro Abs: 5.3 10*3/uL (ref 1.7–7.7)
Neutrophils Relative %: 57 %
Platelets: 686 10*3/uL — ABNORMAL HIGH (ref 150–400)
RBC: 3.64 MIL/uL — ABNORMAL LOW (ref 3.87–5.11)
RDW: 15.9 % — ABNORMAL HIGH (ref 11.5–15.5)
Smear Review: INCREASED
WBC: 9.1 10*3/uL (ref 4.0–10.5)
nRBC: 0 % (ref 0.0–0.2)

## 2022-12-04 LAB — CBC
HCT: 35.6 % — ABNORMAL LOW (ref 36.0–46.0)
Hemoglobin: 11.3 g/dL — ABNORMAL LOW (ref 12.0–15.0)
MCH: 31 pg (ref 26.0–34.0)
MCHC: 31.7 g/dL (ref 30.0–36.0)
MCV: 97.5 fL (ref 80.0–100.0)
Platelets: 705 10*3/uL — ABNORMAL HIGH (ref 150–400)
RBC: 3.65 MIL/uL — ABNORMAL LOW (ref 3.87–5.11)
RDW: 15.8 % — ABNORMAL HIGH (ref 11.5–15.5)
WBC: 9.1 10*3/uL (ref 4.0–10.5)
nRBC: 0 % (ref 0.0–0.2)

## 2022-12-04 LAB — BASIC METABOLIC PANEL
Anion gap: 15 (ref 5–15)
BUN: 48 mg/dL — ABNORMAL HIGH (ref 6–20)
CO2: 27 mmol/L (ref 22–32)
Calcium: 9.5 mg/dL (ref 8.9–10.3)
Chloride: 97 mmol/L — ABNORMAL LOW (ref 98–111)
Creatinine, Ser: 0.85 mg/dL (ref 0.44–1.00)
GFR, Estimated: >60 mL/min (ref >60)
Glucose, Bld: 131 mg/dL — ABNORMAL HIGH (ref 70–99)
Potassium: 5.2 mmol/L — ABNORMAL HIGH (ref 3.5–5.1)
Sodium: 139 mmol/L (ref 135–145)

## 2022-12-04 LAB — GLUCOSE, CAPILLARY
Glucose-Capillary: 115 mg/dL — ABNORMAL HIGH (ref 70–99)
Glucose-Capillary: 118 mg/dL — ABNORMAL HIGH (ref 70–99)
Glucose-Capillary: 124 mg/dL — ABNORMAL HIGH (ref 70–99)
Glucose-Capillary: 127 mg/dL — ABNORMAL HIGH (ref 70–99)
Glucose-Capillary: 138 mg/dL — ABNORMAL HIGH (ref 70–99)
Glucose-Capillary: 98 mg/dL (ref 70–99)

## 2022-12-04 LAB — TECHNOLOGIST SMEAR REVIEW: Plt Morphology: INCREASED

## 2022-12-04 MED ORDER — CARBAMIDE PEROXIDE 6.5 % OT SOLN
5.0000 [drp] | Freq: Every day | OTIC | Status: DC
  Filled 2022-12-04: qty 15

## 2022-12-04 MED ORDER — SERTRALINE HCL 50 MG PO TABS
75.0000 mg | ORAL_TABLET | Freq: Every day | ORAL | Status: DC
  Administered 2022-12-04 – 2022-12-06 (×2): 75 mg
  Filled 2022-12-04 (×2): qty 2

## 2022-12-04 MED ORDER — CLONAZEPAM 1 MG PO TABS
1.0000 mg | ORAL_TABLET | Freq: Three times a day (TID) | ORAL | Status: DC
  Administered 2022-12-04 – 2022-12-06 (×3): 1 mg
  Filled 2022-12-04 (×4): qty 1

## 2022-12-04 MED ORDER — CARBAMIDE PEROXIDE 6.5 % OT SOLN
5.0000 [drp] | Freq: Two times a day (BID) | OTIC | Status: DC
  Administered 2022-12-04 – 2022-12-06 (×5): 5 [drp] via OTIC
  Filled 2022-12-04: qty 15

## 2022-12-04 MED ORDER — FUROSEMIDE 10 MG/ML IJ SOLN
40.0000 mg | Freq: Once | INTRAMUSCULAR | Status: AC
  Administered 2022-12-04: 40 mg via INTRAVENOUS
  Filled 2022-12-04: qty 4

## 2022-12-04 NOTE — Progress Notes (Signed)
Occupational Therapy Treatment Patient Details Name: Cynthia Underwood MRN: [id number] DOB: 02-10-1971 Today's Date: 12/04/2022   History of present illness 52 y.o. admitted 3/9 with Left lower lobe community-acquired pneumonia, Acute hypoxic respiratory failure.   Pt with Vfib arrest on 3/12 with CPR and VDRF wtih transfer to ICU. Extubated 3/15 and re-intubated 3/16. s/p trach 3/25. PMH includes hypothyroidism, RBBB/high-grade heart block (s/p PPM),  sickle cell trait, myotonic dystrophy (dx age 61), tobacco use   OT comments  Pt is making steady progress towards their acute OT goals. She continues to be limited by weakness, activity tolerance, secretions with mobility and anxiety. Overall she needed mod A+2 for all aspects of mobility and tolerated pivotal stepping from the bed>chair.  BP stable throughout positional changes. OT to continue to follow acutely to facilitate progress towards established goals. Pt will continue to benefit from intensive inpatient follow up therapy, >3 hours/day after discharge.     Recommendations for follow up therapy are one component of a multi-disciplinary discharge planning process, led by the attending physician.  Recommendations may be updated based on patient status, additional functional criteria and insurance authorization.    Assistance Recommended at Discharge Frequent or constant Supervision/Assistance  Patient can return home with the following  A lot of help with walking and/or transfers;Two people to help with walking and/or transfers;A lot of help with bathing/dressing/bathroom;Two people to help with bathing/dressing/bathroom;Direct supervision/assist for financial management;Direct supervision/assist for medications management;Assist for transportation;Help with stairs or ramp for entrance;Assistance with cooking/housework;Assistance with feeding   Equipment Recommendations  Other (comment)    Recommendations for Other Services Rehab  consult    Precautions / Restrictions Precautions Precautions: Fall Precaution Comments: trach collar (10L/50%), cortrak Restrictions Weight Bearing Restrictions: No       Mobility Bed Mobility Overal bed mobility: Needs Assistance Bed Mobility: Supine to Sit     Supine to sit: Mod assist, HOB elevated, +2 for safety/equipment     General bed mobility comments: mod +2 for trunk elevation, LE progression to EOB, scooting with bed pad. Increased time    Transfers Overall transfer level: Needs assistance Equipment used: Rolling walker (2 wheels) Transfers: Sit to/from Stand, Bed to chair/wheelchair/BSC Sit to Stand: Mod assist, +2 physical assistance     Step pivot transfers: Mod assist, +2 physical assistance     General transfer comment: mod +2 for power up, rise, steadying, pivotal steps to recliner with HHA+2.     Balance Overall balance assessment: Needs assistance Sitting-balance support: No upper extremity supported, Feet supported, Bilateral upper extremity supported Sitting balance-Leahy Scale: Fair     Standing balance support: Bilateral upper extremity supported, During functional activity Standing balance-Leahy Scale: Poor Standing balance comment: reliant on HHA +2                           ADL either performed or assessed with clinical judgement   ADL Overall ADL's : Needs assistance/impaired Eating/Feeding: NPO                       Toilet Transfer: Moderate assistance;+2 for physical assistance;+2 for safety/equipment Toilet Transfer Details (indicate cue type and reason): simulated. pivotal steps from bed>chair         Functional mobility during ADLs: Moderate assistance;+2 for physical assistance;+2 for safety/equipment General ADL Comments: significantly limited by weakness and activity toleranc e    Extremity/Trunk Assessment Upper Extremity Assessment Upper Extremity Assessment: Generalized weakness  Lower  Extremity Assessment Lower Extremity Assessment: Defer to PT evaluation        Vision   Vision Assessment?: No apparent visual deficits   Perception Perception Perception: Not tested   Praxis Praxis Praxis: Not tested    Cognition Arousal/Alertness: Awake/alert Behavior During Therapy: Anxious Overall Cognitive Status: Difficult to assess                                 General Comments: anxiety once up in chair, tearful.              General Comments trach collar 40%/12LO2, VSS incuding BP with no reports of dizziness. Pt limited in progression by n/v at EOB    Pertinent Vitals/ Pain       Pain Assessment Pain Assessment: Faces Faces Pain Scale: Hurts little more Pain Location: generalized Pain Descriptors / Indicators: Grimacing, Discomfort Pain Intervention(s): Limited activity within patient's tolerance, Monitored during session   Frequency  Min 2X/week        Progress Toward Goals  OT Goals(current goals can now be found in the care plan section)  Progress towards OT goals: Progressing toward goals  Acute Rehab OT Goals Patient Stated Goal: to get warm OT Goal Formulation: With patient Time For Goal Achievement: 12/12/22 Potential to Achieve Goals: Good ADL Goals Pt Will Perform Grooming: with mod assist;sitting Pt Will Perform Upper Body Dressing: with mod assist;sitting Pt Will Perform Lower Body Dressing: with max assist;sit to/from stand Additional ADL Goal #1: Pt will complete bed mobility with min A as a precursor to ADLs  Plan Discharge plan remains appropriate    Co-evaluation    PT/OT/SLP Co-Evaluation/Treatment: Yes Reason for Co-Treatment: For patient/therapist safety;To address functional/ADL transfers;Complexity of the patient's impairments (multi-system involvement) PT goals addressed during session: Mobility/safety with mobility;Balance OT goals addressed during session: ADL's and self-care      AM-PAC OT "6  Clicks" Daily Activity     Outcome Measure   Help from another person eating meals?: Total Help from another person taking care of personal grooming?: A Little Help from another person toileting, which includes using toliet, bedpan, or urinal?: A Lot Help from another person bathing (including washing, rinsing, drying)?: A Lot Help from another person to put on and taking off regular upper body clothing?: A Lot Help from another person to put on and taking off regular lower body clothing?: A Lot 6 Click Score: 12    End of Session Equipment Utilized During Treatment: Gait belt;Oxygen  OT Visit Diagnosis: Unsteadiness on feet (R26.81);Other abnormalities of gait and mobility (R26.89);Muscle weakness (generalized) (M62.81)   Activity Tolerance Patient limited by fatigue;Patient tolerated treatment well   Patient Left in chair;with call bell/phone within reach;with chair alarm set   Nurse Communication Mobility status        Time: [phone] OT Time Calculation (min): 25 min  Charges: OT General Charges $OT Visit: 1 Visit OT Treatments $Therapeutic Activity: 8-22 mins  Shade Flood, OTR/L [place] Office [phone] Secure Chat Communication Preferred   Elliot Cousin 12/04/2022, 11:37 AM

## 2022-12-04 NOTE — Progress Notes (Signed)
Nutrition Follow-up  DOCUMENTATION CODES:  Severe malnutrition in context of chronic illness, Underweight  INTERVENTION:  Continue tube feeding via cortrak tube. Osmolite 1.5 at 50 ml/h (1200 ml per day) Prosource TF20 60 ml 1x/d Provides 1880 kcal, 95 gm protein, 914 ml free water daily Banatrol TID   NUTRITION DIAGNOSIS:  Severe Malnutrition related to chronic illness (myotonic dystrophy) as evidenced by severe fat depletion, severe muscle depletion. -remains applicable  GOAL:  Patient will meet greater than or equal to 90% of their needs - being met with TF  MONITOR:  TF tolerance, I & O's, Vent status, Labs, Weight trends  REASON FOR ASSESSMENT:  Ventilator, Consult Enteral/tube feeding initiation and management  ASSESSMENT:  Pt with hx of myotonic dystrophy and hypothyroidism s/p thyroidectomy presented to ED with worsening cough and diarrhea for 4 days PTA. Found to have pneumonia related to metapneumovirus  3/12 - code blue called for VF arrest, intubated and transferred to ICU 3/14 - right/left heart cath and coronary angiography 3/15 - extubated 3/16 - intubated 3/25 - trach placed, bronch  Patient resting in bed at the time of assessment. TF running at goal and tolerating. Discussed in rounds, pt stable to dc to [facility] but family would like pt to be able to go to CIR. Currently unable to tolerate trach collar for 24 hours due to anxiety. CM working on discharge plan.   Noted rising BUN and K today. Could be the result of diuresing. Will monitor to determine if changes to feeding regimen are needed.   Intake/Output Summary (Last 24 hours) at 12/04/2022 1301 Last data filed at 12/04/2022 1200 Gross per 24 hour  Intake 1135.79 ml  Output 500 ml  Net 635.79 ml  Net IO Since Admission: 9,112.66 mL [12/04/22 1301]  Nutritionally Relevant Medications: Scheduled Meds:  feeding supplement (PROSource TF20)  60 mL Per Tube Daily   fiber supplement (BANATROL TF)  60 mL  Per Tube TID   insulin aspart  0-9 Units Subcutaneous Q4H   levothyroxine  112 mcg Per Tube QAC breakfast   pantoprazole (PROTONIX) IV  40 mg Intravenous QHS   scopolamine  1 patch Transdermal Q72H   Continuous Infusions:  dexmedetomidine (PRECEDEX) IV infusion 0.7 mcg/kg/hr (12/04/22 1200)   feeding supplement (OSMOLITE 1.5 CAL) 50 mL/hr at 12/04/22 1200   PRN Meds: diphenhydrAMINE, docusate, polyethylene glycol  Labs Reviewed: K 5.2 Chloride 97 BUN 48 CBG ranges from 98-127 mg/dL over the last 24 hours  NUTRITION - FOCUSED PHYSICAL EXAM: Flowsheet Row Most Recent Value  Orbital Region Severe depletion  Upper Arm Region Severe depletion  Thoracic and Lumbar Region Severe depletion  Buccal Region Severe depletion  Temple Region Moderate depletion  Clavicle Bone Region Severe depletion  Clavicle and Acromion Bone Region Severe depletion  Scapular Bone Region Moderate depletion  Dorsal Hand Unable to assess  [mittens]  Patellar Region Severe depletion  Anterior Thigh Region Severe depletion  Posterior Calf Region Severe depletion  Edema (RD Assessment) None  Hair Reviewed  Eyes Reviewed  Mouth Reviewed  Skin Reviewed  Nails Unable to assess    Diet Order:   Diet Order             Diet NPO time specified  Diet effective now                   EDUCATION NEEDS:  Not appropriate for education at this time  Skin:  Skin Assessment: Reviewed RN Assessment  Last BM:  3/31 -  type 7  Height:  Ht Readings from Last 1 Encounters:  11/11/22 5\' 3"  (1.6 m)   Weight:  Wt Readings from Last 1 Encounters:  12/04/22 41 kg   Ideal Body Weight:  52.3 kg  BMI:  Body mass index is 16.01 kg/m.  Estimated Nutritional Needs:  Kcal:  1600-1800 kcal/d Protein:  80-95 g/d Fluid:  >/=1.8L/d    Ranell Patrick, RD, LDN Clinical Dietitian RD pager # available in [facility]  After hours/weekend pager # available in [facility]

## 2022-12-04 NOTE — Progress Notes (Addendum)
NAME:  Cynthia Underwood, MRN:  [id number], DOB:  June 19, 1971, LOS: 24 ADMISSION DATE:  11/11/2022 CONSULTATION DATE:  11/14/2022 REFERRING MD:  Wyline Copas - TRH CHIEF COMPLAINT:  Vfib arrest on floor   History of Present Illness:   52 year old woman who initially presented to [facility] 3/9 for cough and progressive dyspnea x 4 days. Also reported diarrhea and 1 episode of epigastric pain with vomiting. PMHx significant for RBBB/high-grade heart block (s/p PPM), hypothyroidism, sickle cell trait, myotonic dystrophy (dx age 52), tobacco use.  Noted to be hypoxic in ED with O2 sat 88% on RA, CTA Chest c/f LLL PNA with mild bronchiectasis, negative for PE. Also c/f esophagitis. Patient was empirically started on CAP coverage (ceftriaxone, azithromycin) and GERD treatment (PPI/sucralfate). Also found to be positive for human metapneumovirus. Ad[facility].  Patient was initially progressing as expected 3/11-3/12 until ~1400, sat was noted to be in 80s. Placed on 5L humidified oxygen with slight improvement in sats. Solumedrol and DuoNebs were ordered. Around 1900 patient's RN was informed by telemetry of VF and was found unresponsive and pulseless. Code Blue was called with CPR x 15 minutes and ROSC. Amiodarone, Epi and defib were administered. Patient was intubated by anesthesia peri-code. IO was placed and Levophed was initiated. Cardiology was consulted.  PCCM was consulted for ICU admission post-arrest.  Pertinent Medical History:  Heart block, Goiter, Sickle cell trait, Myotonic dystrophy  Significant Hospital Events: Including procedures, antibiotic start and stop dates in addition to other pertinent events   3/9 - Presented to [facility] for LLL PNA. Started on Surveyor, quantity. +human metapneumovirus. 3/12 - Hypoxic requiring 5LNC. Later VF arrest on floor, CPR 3/13 echocardiogram LVEF severely depressed 50% (decreased compared with 01/2021), severely reduced RV function 3/14 Cardiac catheterization showed  normal coronaries, new normal filling pressures and normal PA pressure.  With excellent cardiac output 3/15 Extubated.  3/16 Reintubated for increased work of breathing, failed BiPAP 3/18 alert, interactive off vasopressors 3/20 Wean, trial extubation today  3/21 No acute issues overnight, failed early am wean due to apnea on 50 Fentanyl and 0.8 Pecedex  3/24 PRBC transfusion 3/25 Percutaneous tracheostomy 3/29 ATC trials during the day  Interim History / Subjective:   Patient was on ventilator overnight.  She states that she could not tolerate more than 12 hours of trach collar is due to anxiety.  She would like to talk to a chaplain.  Objective:  Blood pressure 95/67, pulse 69, temperature 98.2 F (36.8 C), temperature source Oral, resp. rate (!) 24, height 5\' 3"  (1.6 m), weight 41 kg, last menstrual period 12/03/2016, SpO2 96 %.    Vent Mode: PRVC FiO2 (%):  [50 %] 50 % Set Rate:  [24 bmp] 24 bmp Vt Set:  [410 mL] 410 mL PEEP:  [5 cmH20] 5 cmH20 Plateau Pressure:  [16 cmH20-18 cmH20] 17 cmH20   Intake/Output Summary (Last 24 hours) at 12/04/2022 0723 Last data filed at 12/04/2022 0700 Gross per 24 hour  Intake 1075.12 ml  Output 550 ml  Net 525.12 ml    Filed Weights   12/01/22 0500 12/03/22 0339 12/04/22 0500  Weight: 41.4 kg 39.1 kg 41 kg   Physical Exam: General: Chronically and cachectic-appearing, no acute distress HENT: [place], AT, OP clear, MMM Eyes: EOMI, no scleral icterus Respiratory: Diminished but clear to auscultation bilaterally.  No  Cardiovascular: RRR, -M/R/G GI: BS+, soft, nontender Extremities:-Edema,-tenderness Neuro: AAO x2, CNII-XII grossly intact  95/67, 70, 24, afebrile Vent QHS. Unable to tolerate trach collar >  12h Precedex 0.8 WBC 9.1, Hgb 11.3, pt 703 Na 139, K 5.2, Cr 0.85  Resolved Hospital Problem List:  Septic shock from pneumonia, Cardiogenic shock from cardiac arrest, Shock liver in setting of cardiac arrest  Assessment & Plan:   Post-Vfib arrest. Elevated troponin. Heart block s/p MDT PPM. New acute non ischemic systolic CHF with EF [id number]. Heart failure team is following peripherally -Continue spironolactone -PO Lasix 20 mg twice daily. Only got 1 dose yesteray (Net + 500 cc and +9L total) -Hypotension is a barrier for adding GDMT  Acute hypoxic respiratory failure from cardiac arrest, CHF, Metapneumovirus pneumonia, HCAP, muscle weakness from myotonic dystrophy. S/p tracheostomy on 3/25 Hospital-acquired pneumonia SLP consult for PMV Trach collar as tolerated.  Will reattempt or a trach collar today. S/p Cefepime.  BAL cultures from 3/25 shows normal flora   Acute metabolic encephalopathy from anoxia, sepsis. -Difficulty weaning off of Precedex due to agitation and anxiety.  -Seroquel 25 mg QHS PRN per Psych -Increase klonopin 1 mg TID 4/1 -Appreciate psych input   Thrombocytosis -Obtain blood smear  Hyperglycemia. SSI   GERD with esophagitis. Continue PPI   Hypothyroidism. Continue levothyroxine  Anemia of iron deficiency, critical illness and chronic disease. f/u CBC Transfuse for Hb < 7 Given Ferrlecit on Sunday 3/24 for iron supplementation  Severe malnutrition, present on admission Continue tube feeds  Disposition Inpatient rehab referral ordered for evaluation.  But if patient continues to have difficulty weaning off of ventilator, LTAC may be a better option for her. Sister Deedra [facility]rich [facility] and asked [facility] to run patient's insurance.   Best Practice: (right click and "Reselect all SmartList Selections" daily)   Diet/type: tubefeeds DVT prophylaxis: Lovenox GI prophylaxis: PPI Lines:NA Foley:  NA Code Status: Full Last date of multidisciplinary goals of care discussion [updated sister via phone 4/1] Signature:   Gaylan Gerold, DO Internal Medicine Residency My pager: [phone]

## 2022-12-04 NOTE — Progress Notes (Signed)
eLink Physician-Brief Progress Note Patient Name: Cynthia Underwood DOB: 1971/02/03 MRN: [id number]   Date of Service  12/04/2022  HPI/Events of Note  Informed of significant earwax Already on ear drops  eICU Interventions  May need to be physically removed which may entail ENT referral in AM     Intervention Category Minor Interventions: Other:  Judd Lien 12/04/2022, 4:45 AM

## 2022-12-04 NOTE — Progress Notes (Signed)
[place] Progress Note Patient Name: Cynthia Underwood DOB: 04-Aug-1971 MRN: [id number]   Date of Service  12/04/2022  HPI/Events of Note  Patient has had flexiseal for a few days but does not have an updated order. RN requested order  eICU Interventions  Placed     Intervention Category Intermediate Interventions: Other:  Margaretmary Lombard 12/04/2022, 9:57 PM  Addendum at 3:05 am - Straight cath x 1 for bladder scan over 900 cc

## 2022-12-04 NOTE — Progress Notes (Signed)
Pt complaining of right ear pain. RN used ear drops for ear wax but it was not effective. Upon assessment, there is a large amount of wax built up in ear. Notified MD for further evaluation.   Cynthia Underwood

## 2022-12-04 NOTE — Progress Notes (Signed)
Inpatient Rehab Admissions Coordinator:    I spoke with sister Deedra Ehrich regarding potential CIR admit. At this time, Pt. Isn't medically ready for CIR as she remains on 15L oxygen via TC and is not fully weaned from vent. If she is able to wean and reduce oxygen requirements, pt. Would be a great candidate for CIR. If unable to wean, may want to consider d/c to [facility] instead. Sister states she prefers CIR but understands it may not be an option. She did affirm that family is working toward setting up 24/7 support.   Clemens Catholic, [place], [place] Admissions Coordinator  [phone] ([place]) [phone] (office)

## 2022-12-04 NOTE — Progress Notes (Signed)
RT called to patient beside due to desaturation. Patient currently on 40% ATC. Patient sat 84-85% and has increased WOB with RR 38. RT place patient back on Vent to rest tonight. Patient tolerating well at this time.

## 2022-12-04 NOTE — Progress Notes (Signed)
Physical Therapy Treatment Patient Details Name: Cynthia Underwood MRN: [id number] DOB: 19-Apr-1971 Today's Date: 12/04/2022   History of Present Illness 52 y.o. admitted 3/9 with Left lower lobe community-acquired pneumonia, Acute hypoxic respiratory failure.   Pt with Vfib arrest on 3/12 with CPR and VDRF wtih transfer to ICU. Extubated 3/15 and re-intubated 3/16. s/p trach 3/25. PMH includes hypothyroidism, RBBB/high-grade heart block (s/p PPM),  sickle cell trait, myotonic dystrophy (dx age 53), tobacco use    PT Comments    Pt demonstrating good progression today, tolerating transfer OOB to recliner with mod +2 assist. Pt demonstrates significant LE weakness and decreased activity tolerance, but improving each session. Pt with coughing with + secretions through trach site throughout session, but improved from last session. SPO2 maintained 90% and greater. PT tolerated LE exercise well, up in chair supported with pillows. PT to continue to follow, d/c recommendations remain appropriate.      Recommendations for follow up therapy are one component of a multi-disciplinary discharge planning process, led by the attending physician.  Recommendations may be updated based on patient status, additional functional criteria and insurance authorization.  Follow Up Recommendations       Assistance Recommended at Discharge Frequent or constant Supervision/Assistance  Patient can return home with the following A lot of help with walking and/or transfers;A lot of help with bathing/dressing/bathroom;Assistance with cooking/housework;Assist for transportation;Help with stairs or ramp for entrance   Equipment Recommendations  Other (comment) (defer to next venue)    Recommendations for Other Services       Precautions / Restrictions Precautions Precautions: Fall Precaution Comments: trach collar (10L/50%), cortrak Restrictions Weight Bearing Restrictions: No     Mobility  Bed Mobility Overal  bed mobility: Needs Assistance Bed Mobility: Supine to Sit     Supine to sit: Mod assist, HOB elevated, +2 for safety/equipment     General bed mobility comments: mod +2 for trunk elevation, LE progression to EOB, scooting with bed pad. Increased time    Transfers Overall transfer level: Needs assistance Equipment used: Rolling walker (2 wheels) Transfers: Sit to/from Stand, Bed to chair/wheelchair/BSC Sit to Stand: Mod assist, +2 physical assistance   Step pivot transfers: Mod assist, +2 physical assistance       General transfer comment: mod +2 for power up, rise, steadying, pivotal steps to recliner with HHA+2.    Ambulation/Gait                   Stairs             Wheelchair Mobility    Modified Rankin (Stroke Patients Only)       Balance Overall balance assessment: Needs assistance Sitting-balance support: No upper extremity supported, Feet supported, Bilateral upper extremity supported Sitting balance-Leahy Scale: Fair     Standing balance support: Bilateral upper extremity supported, During functional activity Standing balance-Leahy Scale: Poor Standing balance comment: reliant on HHA +2                            Cognition Arousal/Alertness: Awake/alert Behavior During Therapy: Anxious Overall Cognitive Status: Difficult to assess                                 General Comments: anxiety once up in chair, tearful.        Exercises General Exercises - Lower Extremity Long Arc Quad: AAROM, Both, 10 reps,  Seated    General Comments        Pertinent Vitals/Pain Pain Assessment Pain Assessment: Faces Faces Pain Scale: Hurts little more Pain Location: generalized Pain Descriptors / Indicators: Grimacing, Discomfort Pain Intervention(s): Limited activity within patient's tolerance, Monitored during session, Repositioned    Home Living                          Prior Function            PT  Goals (current goals can now be found in the care plan section) Acute Rehab PT Goals Patient Stated Goal: get well PT Goal Formulation: With patient Time For Goal Achievement: 12/07/22 Potential to Achieve Goals: Good Progress towards PT goals: Progressing toward goals    Frequency    Min 3X/week      PT Plan Current plan remains appropriate    Co-evaluation PT/OT/SLP Co-Evaluation/Treatment: Yes Reason for Co-Treatment: For patient/therapist safety;To address functional/ADL transfers;Complexity of the patient's impairments (multi-system involvement) PT goals addressed during session: Mobility/safety with mobility;Balance        AM-PAC PT "6 Clicks" Mobility   Outcome Measure  Help needed turning from your back to your side while in a flat bed without using bedrails?: A Lot Help needed moving from lying on your back to sitting on the side of a flat bed without using bedrails?: A Lot Help needed moving to and from a bed to a chair (including a wheelchair)?: A Lot Help needed standing up from a chair using your arms (e.g., wheelchair or bedside chair)?: A Lot Help needed to walk in hospital room?: Total Help needed climbing 3-5 steps with a railing? : Total 6 Click Score: 10    End of Session Equipment Utilized During Treatment: Other (comment) (trach collar) Activity Tolerance: Patient limited by fatigue Patient left: with call bell/phone within reach;in chair;with chair alarm set;with nursing/sitter in room Nurse Communication: Mobility status PT Visit Diagnosis: Unsteadiness on feet (R26.81);Other abnormalities of gait and mobility (R26.89);Muscle weakness (generalized) (M62.81)     Time: [id number] PT Time Calculation (min) (ACUTE ONLY): 25 min  Charges:  $Therapeutic Activity: 8-22 mins                     Stacie Glaze, PT DPT Acute Rehabilitation Services Pager [phone]  Office [phone]    Roxine Caddy E Ruffin Pyo 12/04/2022, 10:48 AM

## 2022-12-04 NOTE — Progress Notes (Addendum)
Advanced Heart Failure Rounding Note  PCP-Cardiologist: Fransico Him, MD   Subjective:   Cath 3/14 c/w nonischemic cardiomyopathy, filling pressures minimally elevated on RHC. Excellent cardiac output by Fick. Extubated 3/15. Reintubated (3/16). Now s/p trach 3/25  No further VT/VF/torsades  Remains on vent through trach. Continues w/ tube feeds. Overall net +. Received dose of IV Lasix this morning. K 5.2. SBPs soft, 90s. No longer w/ central access.     Objective:   Weight Range: 41 kg Body mass index is 16.01 kg/m.   Vital Signs:   Temp:  [98.1 F (36.7 C)-98.6 F (37 C)] 98.2 F (36.8 C) (04/01 0733) Pulse Rate:  [68-76] 70 (04/01 0730) Resp:  [16-29] 26 (04/01 0730) BP: (77-107)/(55-77) 95/67 (04/01 0700) SpO2:  [82 %-98 %] 96 % (04/01 0730) FiO2 (%):  [50 %] 50 % (04/01 0730) Weight:  [41 kg] 41 kg (04/01 0500) Last BM Date : 12/03/22  Weight change: Filed Weights   12/01/22 0500 12/03/22 0339 12/04/22 0500  Weight: 41.4 kg 39.1 kg 41 kg   Intake/Output:   Intake/Output Summary (Last 24 hours) at 12/04/2022 0906 Last data filed at 12/04/2022 0800 Gross per 24 hour  Intake 1060.66 ml  Output 500 ml  Net 560.66 ml   Physical Exam    General:  chronically ill appearing, thin AAF  HEENT: +cortrak, +trach Neck: supple. JVD not elevated, Carotids 2+ bilat; no bruits. No lymphadenopathy or thyromegaly appreciated. Cor: PMI nondisplaced. Regular rhythm and rate. No rubs, gallops or murmurs. Lungs: decreased BS at the bases, no wheezing   Abdomen: soft, nontender, nondistended. No hepatosplenomegaly. No bruits or masses. Good bowel sounds. Extremities: no cyanosis, clubbing, rash, edema. Neuro: nods appropriately on trach/vent Neuro: affect flat. Moves all 4 ext w/o difficulty  GU: + foley   Telemetry   NSR-ST, 90s-low 100s, 1st deg AVB. No further VT/VF/torsades (Personally reviewed)    Labs    CBC Recent Labs    12/02/22 0104 12/04/22 0247  WBC  14.9* 9.1  HGB 11.1* 11.3*  HCT 33.7* 35.6*  MCV 98.5 97.5  PLT 709* [id number]*   Basic Metabolic Panel Recent Labs    12/03/22 1234 12/04/22 0247  NA 136 139  K 5.1 5.2*  CL 101 97*  CO2 25 27  GLUCOSE 120* 131*  BUN 48* 48*  CREATININE 0.95 0.85  CALCIUM 9.2 9.5   Liver Function Tests No results for input(s): "AST", "ALT", "ALKPHOS", "BILITOT", "PROT", "ALBUMIN" in the last 72 hours.  No results for input(s): "LIPASE", "AMYLASE" in the last 72 hours. Cardiac Enzymes No results for input(s): "CKTOTAL", "CKMB", "CKMBINDEX", "TROPONINI" in the last 72 hours.  BNP: BNP (last 3 results) Recent Labs    11/11/22 1238 11/17/22 2150  BNP 58.5 145.2*   ProBNP (last 3 results) No results for input(s): "PROBNP" in the last 8760 hours.  D-Dimer No results for input(s): "DDIMER" in the last 72 hours. Hemoglobin A1C No results for input(s): "HGBA1C" in the last 72 hours.  Fasting Lipid Panel No results for input(s): "CHOL", "HDL", "LDLCALC", "TRIG", "CHOLHDL", "LDLDIRECT" in the last 72 hours. Thyroid Function Tests No results for input(s): "TSH", "T4TOTAL", "T3FREE", "THYROIDAB" in the last 72 hours.  Invalid input(s): "FREET3"  Other results:  Imaging   No results found.  Medications:     Scheduled Medications:  [START ON 12/05/2022] carbamide peroxide  5 drop Right EAR Daily   Chlorhexidine Gluconate Cloth  6 each Topical Daily   clonazePAM  1  mg Per Tube Q8H   enoxaparin (LOVENOX) injection  30 mg Subcutaneous Q24H   feeding supplement (PROSource TF20)  60 mL Per Tube Daily   fiber supplement (BANATROL TF)  60 mL Per Tube TID   insulin aspart  0-9 Units Subcutaneous Q4H   levothyroxine  112 mcg Per Tube QAC breakfast   mouth rinse  15 mL Mouth Rinse Q2H   pantoprazole (PROTONIX) IV  40 mg Intravenous QHS   QUEtiapine  25 mg Per Tube QHS   scopolamine  1 patch Transdermal Q72H   sodium chloride flush  10-40 mL Intracatheter Q12H   sodium chloride flush  3 mL  Intravenous Q12H   spironolactone  25 mg Per Tube Daily    Infusions:  sodium chloride Stopped (12/02/22 2226)   dexmedetomidine (PRECEDEX) IV infusion 0.8 mcg/kg/hr (12/04/22 0800)   feeding supplement (OSMOLITE 1.5 CAL) 50 mL/hr at 12/04/22 0800    PRN Medications: sodium chloride, acetaminophen, diphenhydrAMINE, docusate, guaiFENesin-codeine, ipratropium-albuterol, lidocaine, lip balm, melatonin, midazolam, mouth rinse, oxyCODONE, polyethylene glycol, sodium chloride flush, sodium chloride flush  Patient Profile   52 y.o. female with a hx of tobacco use, COPD, myotonic dystrophy type I since age 79, high grade heart block s/p PPM, RBBB, sickle cell trait, and DM1 (questionable diagnosis) and strong family history of heart failure (father s/p cardiac transplant and paternal aunt w/ NICM w/ LVAD), admitted w/ acute hypoxic respiratory failure/ LLL CAP secondary to metapneumovirus. Hospital course c/b VF arrest. Post arrest echo w/ severe BiV failure, LVEF < 20%, RV severely reduced + diffuse HK   Assessment/Plan  1. VF arrest: 3/12, shocked. 15 minutes CPR total.  - Due to long-short interval (R-on-T) in the setting of QT-prolonging drugs (azithro) and hypokalemia - Pacemaker lower rate limit increased to 90 to prevent pause-dependent TdP; now down to 70 - Per EP: may require upgrade with LV lead +/-upgrade to primary prevention ICD if EF does not recover with GDMT. VT this admission was attributable to a reversible cause, so ICD not indicated urgently for secondary prevention. Can consider placement of LifeVest on discharge but given stability of rhythm and need for chronic trach care would likely defer. - No arrhythmias on tele - Keep K> 4.0 Mg > 2.0 - avoid QT prolonging agents  2. Acute hypoxemic respiratory failure: Intubated post-VF arrest.  Had viral PNA with metapneumovirus at presentation.  - extubated 3/14 Reintubated 3/16 - Failed multiple SBTs - Trached and bronch 3/25 -  CCM managing.   3. Acute systolic CHF: Prior workup for cardiac sarcoidosis. Cardiac MRI in 2017 showed subendocardial LGE in the mid inferior and inferolateral walls with EF 56%.  Cardiac PET done after this was NOT suggestive of cardiac sarcoidosis.  - Echo in 5/22 with normal EF.   - Echo this admission reviewed, EF 20-25% range with diffuse hypokinesis but relatively preserved function at the apex, moderate RV dysfunction.  - cath 3/15 no CAD - suspect due to myotonic dystrophy type 1 -> she has an aunt with LVAD who has cardiomyopathy due to myotonic dystrophy.  Father had a heart transplant, uncertain of underlying condition. R/LHC c/w Nonischemic cardiomyopathy.  - Overall net +. Getting TFs. Received dose of 40 mg IV Lasix today. No central access for CVP monitoring. SBPs soft, 90s. K 5.2  - Holding spiro today w/ hyperkalemia  - Eventual SGLT2i when foley removed - BP too soft currently for ARNi.  - Given overall debility, she is not currently a candidate for advanced  therapies - Would consider palliative care input    4. High grade heart block: Has MDT PPM with His bundle lead.  This is likely due to cardiac involvement of myotonic dystrophy type 1.  - plan as above  5. Myotonic dystrophy: Type 1, diagnosed at age 18. Has family members with the same diagnosis.  Followed by neurology.   6. Tobacco abuse - needs cessation  7. Hypokalemia/hypomag - K elevated today at 5.2 - received dose of IV Lasix this am - hold spiro today  - follow BMP   8. Anemia of chronic illness - s/p 1uPRBC this admit - hgb stable today at 11.3    Length of Stay: [address], PA-C  12/04/2022, 9:06 AM  Advanced Heart Failure Team Pager [phone] (M-F; 7a - 5p)  Please contact [facility] for night-coverage after hours (5p -7a ) and weekends on [email or web address]  Patient seen with PA, agree with the above note.   She is on trach collar trial in chair today.  SBP remains in 90s, K 5.2.     General: NAD Neck: No JVD, no thyromegaly or thyroid nodule. Tracheostomy Lungs: Rhonchi.  CV: Nondisplaced PMI.  Heart regular S1/S2, no S3/S4, no murmur.  No peripheral edema.   Abdomen: Soft, nontender, no hepatosplenomegaly, no distention.  Skin: Intact without lesions or rashes.  Neurologic: Alert and oriented x 3.  Psych: Normal affect. Extremities: No clubbing or cyanosis.  HEENT: Normal.   Trach collar trial today, hopefully we can get her to the point of decannulation soon.  Suspect muscle weakness from myotonic dystrophy plays a large role.   Nonischemic cardiomyopathy, likely from myotonic dystrophy.  Limited in terms of GDMT by soft BP.  Spironolactone held today with high K.  - Lasix 40 mg IV today.  - Hopefully restart spironolactone 12.5 daily tomorrow.  - SGLT2 inhibitor when foley out.   Loralie Champagne 12/04/2022 11:30 AM

## 2022-12-04 NOTE — Progress Notes (Signed)
Chaplain addressed referral from [facility] for follow up visit as she had tried to visit multiple times throughout the day without success.  Chaplain found pt unable to speak and pt's handwriting was illegible.  Pt unable to give indication to simple yes/no questions.  As it was impossible to address the patient's deep needs, Chaplain offered a blessing at bedside.  [place]

## 2022-12-04 NOTE — TOC Progression Note (Signed)
Transition of Care [facility]) - Progression Note    Patient Details  Name: Cynthia Underwood MRN: [id number] Date of Birth: [birth year]  Transition of Care [facility] CM/SW Contact  Erenest Rasher, RN Phone Number: [phone] 12/04/2022, 1:57 PM  Clinical Narrative:     Referral received for LTAC. Contacted [facility] rep, Anderson Malta with referral.   Expected Discharge Plan: Long Term Acute Care (LTAC) Barriers to Discharge: Continued Medical Work up  Expected Discharge Plan and Services   Discharge Planning Services: CM Consult Post Acute Care Choice: Long Term Acute Care (LTAC)                                         Social Determinants of Health (SDOH) Interventions SDOH Screenings   Food Insecurity: No Food Insecurity (10/16/2022)  Housing: Low Risk  (10/16/2022)  Transportation Needs: No Transportation Needs (10/16/2022)  Utilities: Not At Risk (10/16/2022)  Alcohol Screen: Low Risk  (10/16/2022)  Depression (PHQ2-9): Medium Risk (11/08/2022)  Financial Resource Strain: Low Risk  (10/16/2022)  Physical Activity: Inactive (10/16/2022)  Social Connections: Socially Isolated (10/16/2022)  Stress: Stress Concern Present (10/16/2022)  Tobacco Use: High Risk (11/17/2022)    Readmission Risk Interventions     No data to display

## 2022-12-04 NOTE — Progress Notes (Addendum)
Speech Language Pathology Treatment: Nada Boozer Speaking valve  Patient Details Name: Cynthia Underwood MRN: [id number] DOB: [birth year] Today's Date: 12/04/2022 Time: [id number] SLP Time Calculation (min) (ACUTE ONLY): 16 min  Assessment / Plan / Recommendation Clinical Impression  Pt seen for PMV on trach collar with cuff deflated and initially needed encouragement to participate. She wore valve for total of approximately 15 min with adequate inhalation and exhalation without air trapping. Weak throat clears with valve and pt is significantly deconditioned. Verbal cues, tactile cues with pressure on diaphragm and donn/doff of valve were used to facilitate upward movement and expectoration of secretions. Eventually she was able to expectorate mild amount orally and when valve doffed cough mucous from trach. Attempts at phonation are more of a whisper using false vocal cords with a raspy quality. Resistance on therapist's hand used to increase volume was not successful this session. Pt with significant deconditioning and decreased respiratory support. Vitals remained stable. Pt may wear valve with full supervision with staff while awake. She continues to complain of right ear pain.    HPI HPI: 52 y.o. admitted 3/9 with Left lower lobe community-acquired pneumonia, Acute hypoxic respiratory failure.   Pt with Vfib arrest on 3/12 with CPR and VDRF wtih transfer to ICU. Extubated 3/15 and re-intubated 3/16. s/p trach 3/25. PMH includes hypothyroidism, RBBB/high-grade heart block (s/p PPM),  sickle cell trait, myotonic dystrophy (dx age 89), tobacco use.      SLP Plan  Continue with current plan of care      Recommendations for follow up therapy are one component of a multi-disciplinary discharge planning process, led by the attending physician.  Recommendations may be updated based on patient status, additional functional criteria and insurance authorization.    Recommendations         Patient may  use Passy-Muir Speech Valve: During all therapies with supervision PMSV Supervision: Full MD: Please consider changing trach tube to : Cuffless (once able to come off vent)           Oral care QID   Frequent or constant Supervision/Assistance Aphonia (R49.1)     Continue with current plan of care     Houston Siren  12/04/2022, 12:08 PM

## 2022-12-05 DIAGNOSIS — J9601 Acute respiratory failure with hypoxia: Secondary | ICD-10-CM | POA: Diagnosis not present

## 2022-12-05 LAB — BASIC METABOLIC PANEL
Anion gap: 14 (ref 5–15)
BUN: 43 mg/dL — ABNORMAL HIGH (ref 6–20)
CO2: 25 mmol/L (ref 22–32)
Calcium: 9.4 mg/dL (ref 8.9–10.3)
Chloride: 103 mmol/L (ref 98–111)
Creatinine, Ser: 0.76 mg/dL (ref 0.44–1.00)
GFR, Estimated: >60 mL/min (ref >60)
Glucose, Bld: 144 mg/dL — ABNORMAL HIGH (ref 70–99)
Potassium: 4.6 mmol/L (ref 3.5–5.1)
Sodium: 142 mmol/L (ref 135–145)

## 2022-12-05 LAB — GLUCOSE, CAPILLARY
Glucose-Capillary: 109 mg/dL — ABNORMAL HIGH (ref 70–99)
Glucose-Capillary: 115 mg/dL — ABNORMAL HIGH (ref 70–99)
Glucose-Capillary: 120 mg/dL — ABNORMAL HIGH (ref 70–99)
Glucose-Capillary: 105 mg/dL — ABNORMAL HIGH (ref 70–99)
Glucose-Capillary: 109 mg/dL — ABNORMAL HIGH (ref 70–99)

## 2022-12-05 LAB — CBC
HCT: 35.4 % — ABNORMAL LOW (ref 36.0–46.0)
Hemoglobin: 11.1 g/dL — ABNORMAL LOW (ref 12.0–15.0)
MCH: 30.9 pg (ref 26.0–34.0)
MCHC: 31.4 g/dL (ref 30.0–36.0)
MCV: 98.6 fL (ref 80.0–100.0)
Platelets: 605 10*3/uL — ABNORMAL HIGH (ref 150–400)
RBC: 3.59 MIL/uL — ABNORMAL LOW (ref 3.87–5.11)
RDW: 15.9 % — ABNORMAL HIGH (ref 11.5–15.5)
WBC: 11.1 10*3/uL — ABNORMAL HIGH (ref 4.0–10.5)
nRBC: 0 % (ref 0.0–0.2)

## 2022-12-05 LAB — PATHOLOGIST SMEAR REVIEW: Path Review: REACTIVE

## 2022-12-05 MED ORDER — SODIUM CHLORIDE 0.9 % IV SOLN
6.2500 mg | Freq: Once | INTRAVENOUS | Status: AC
  Administered 2022-12-05: 6.25 mg via INTRAVENOUS
  Filled 2022-12-05: qty 0.25

## 2022-12-05 MED ORDER — PANCRELIPASE (LIP-PROT-AMYL) 10440-39150 UNITS PO TABS
20880.0000 [IU] | ORAL_TABLET | Freq: Once | ORAL | Status: AC
  Administered 2022-12-05: 20880 [IU]
  Filled 2022-12-05: qty 2

## 2022-12-05 MED ORDER — SPIRONOLACTONE 12.5 MG HALF TABLET
12.5000 mg | ORAL_TABLET | Freq: Every day | ORAL | Status: DC
  Administered 2022-12-06: 12.5 mg
  Filled 2022-12-05: qty 1

## 2022-12-05 MED ORDER — SODIUM BICARBONATE 650 MG PO TABS
650.0000 mg | ORAL_TABLET | Freq: Once | ORAL | Status: AC
  Administered 2022-12-05: 650 mg
  Filled 2022-12-05: qty 1

## 2022-12-05 MED ORDER — FUROSEMIDE 10 MG/ML IJ SOLN
40.0000 mg | Freq: Once | INTRAMUSCULAR | Status: AC
  Administered 2022-12-05: 40 mg via INTRAVENOUS
  Filled 2022-12-05: qty 4

## 2022-12-05 MED ORDER — DEXMEDETOMIDINE HCL IN NACL 400 MCG/100ML IV SOLN
0.0000 ug/kg/h | INTRAVENOUS | Status: DC
  Administered 2022-12-05: 0.5 ug/kg/h via INTRAVENOUS

## 2022-12-05 NOTE — Progress Notes (Signed)
Physical Therapy Treatment Patient Details Name: Cynthia Underwood MRN: [id number] DOB: 1971-03-07 Today's Date: 12/05/2022   History of Present Illness 52 y.o. admitted 3/9 with Left lower lobe community-acquired pneumonia, Acute hypoxic respiratory failure.   Pt with Vfib arrest on 3/12 with CPR and VDRF wtih transfer to ICU. Extubated 3/15 and re-intubated 3/16. s/p trach 3/25. PMH includes hypothyroidism, RBBB/high-grade heart block (s/p PPM),  sickle cell trait, myotonic dystrophy (dx age 71), tobacco use    PT Comments    Pt resting upon PT arrival to room, wakes easily and expresses need to go to the bathroom. Pt tolerating repeated transfers into standing this date, to and from [facility], and short distance gait in room. Pt overall requiring mod physical assist for mobility, improving with each PT session. Pt tolerated LE exercises in chair well. SPO2 87-88% on 50%/12L post-session, RRT aware and monitoring for pt safety. PT to continue to follow.      Recommendations for follow up therapy are one component of a multi-disciplinary discharge planning process, led by the attending physician.  Recommendations may be updated based on patient status, additional functional criteria and insurance authorization.  Follow Up Recommendations       Assistance Recommended at Discharge Frequent or constant Supervision/Assistance  Patient can return home with the following A lot of help with walking and/or transfers;A lot of help with bathing/dressing/bathroom;Assistance with cooking/housework;Assist for transportation;Help with stairs or ramp for entrance   Equipment Recommendations  Other (comment) (defer)    Recommendations for Other Services Rehab consult     Precautions / Restrictions Precautions Precautions: Fall Precaution Comments: trach collar (10L/50%), cortrak Restrictions Weight Bearing Restrictions: No     Mobility  Bed Mobility Overal bed mobility: Needs Assistance Bed  Mobility: Rolling, Sidelying to Sit Rolling: Min assist Sidelying to sit: Mod assist, HOB elevated       General bed mobility comments: assist for roll to L for truncal translation, power up from bed at shoulder girdle    Transfers Overall transfer level: Needs assistance Equipment used: 1 person hand held assist Transfers: Sit to/from Stand, Bed to chair/wheelchair/BSC Sit to Stand: Mod assist   Step pivot transfers: Mod assist       General transfer comment: assist for power up, rise, steadying, pivot to [facility] towards R. sit<>stand x2, from EOB and BSC.    Ambulation/Gait Ambulation/Gait assistance: Mod assist Gait Distance (Feet): 8 Feet Assistive device: 1 person hand held assist Gait Pattern/deviations: Step-through pattern, Decreased stride length, Trunk flexed Gait velocity: decr     General Gait Details: assist to steady, guide pt, lines/leads. VSS   Stairs             Wheelchair Mobility    Modified Rankin (Stroke Patients Only)       Balance Overall balance assessment: Needs assistance Sitting-balance support: No upper extremity supported, Feet supported, Bilateral upper extremity supported Sitting balance-Leahy Scale: Fair     Standing balance support: During functional activity, Single extremity supported Standing balance-Leahy Scale: Poor Standing balance comment: reliant on HHA                            Cognition Arousal/Alertness: Awake/alert Behavior During Therapy: Anxious Overall Cognitive Status: Difficult to assess  Exercises General Exercises - Lower Extremity Ankle Circles/Pumps: AROM, Both, 5 reps, Seated Long Arc Quad: AROM, Both, 5 reps, Seated Hip Flexion/Marching: AROM, Both, 5 reps, Seated    General Comments General comments (skin integrity, edema, etc.): trach collar 50%/12L. Spo2 87-88% post-gait, RRT informed and she reports she will monitor       Pertinent Vitals/Pain Pain Assessment Pain Assessment: Faces Faces Pain Scale: Hurts even more Pain Location: chest wall, due to coughing Pain Descriptors / Indicators: Grimacing, Discomfort Pain Intervention(s): Limited activity within patient's tolerance, Monitored during session, Repositioned    Home Living                          Prior Function            PT Goals (current goals can now be found in the care plan section) Acute Rehab PT Goals Patient Stated Goal: get well PT Goal Formulation: With patient Time For Goal Achievement: 12/07/22 Potential to Achieve Goals: Good Progress towards PT goals: Progressing toward goals    Frequency           PT Plan Current plan remains appropriate    Co-evaluation              AM-PAC PT "6 Clicks" Mobility   Outcome Measure  Help needed turning from your back to your side while in a flat bed without using bedrails?: A Little Help needed moving from lying on your back to sitting on the side of a flat bed without using bedrails?: A Lot Help needed moving to and from a bed to a chair (including a wheelchair)?: A Lot Help needed standing up from a chair using your arms (e.g., wheelchair or bedside chair)?: A Lot Help needed to walk in hospital room?: A Lot Help needed climbing 3-5 steps with a railing? : A Lot 6 Click Score: 13    End of Session Equipment Utilized During Treatment: Gait belt;Other (comment) (trach collar) Activity Tolerance: Patient limited by fatigue Patient left: with call bell/phone within reach;in chair;with chair alarm set;with nursing/sitter in room (NT in room) Nurse Communication: Mobility status PT Visit Diagnosis: Unsteadiness on feet (R26.81);Other abnormalities of gait and mobility (R26.89);Muscle weakness (generalized) (M62.81)     Time: 1000-1032 PT Time Calculation (min) (ACUTE ONLY): 32 min  Charges:  $Therapeutic Activity: 23-37 mins                     Stacie Glaze, PT  DPT Acute Rehabilitation Services Pager [phone]  Office [phone]    [place] E Ruffin Pyo 12/05/2022, 11:10 AM

## 2022-12-05 NOTE — TOC Progression Note (Signed)
Transition of Care [facility]) - Progression Note    Patient Details  Name: Cynthia Underwood MRN: [id number] Date of Birth: 1970-11-24  [facility] of [facility]) CM/SW Contact  Erenest Rasher, RN Phone Number: [phone] 12/05/2022, 2:21 PM  Clinical Narrative:    223 pm Pt was approved for LTAC from [facility].   Presenter, broadcasting has been offered for [name]. MD can call [phone] opt 5 This offer expires @4pm  today. They will ask for DOB and Ins subscriber [id number].   Expected Discharge Plan: Long Term Acute Care (LTAC) Barriers to Discharge: Continued Medical Work up  Expected Discharge Plan and Services   Discharge Planning Services: CM Consult Post Acute Care Choice: Long Term Acute Care (LTAC)     Social Determinants of Health (SDOH) Interventions SDOH Screenings   Food Insecurity: No Food Insecurity (10/16/2022)  Housing: Low Risk  (10/16/2022)  Transportation Needs: No Transportation Needs (10/16/2022)  Utilities: Not At Risk (10/16/2022)  Alcohol Screen: Low Risk  (10/16/2022)  Depression (PHQ2-9): Medium Risk (11/08/2022)  Financial Resource Strain: Low Risk  (10/16/2022)  Physical Activity: Inactive (10/16/2022)  Social Connections: Socially Isolated (10/16/2022)  Stress: Stress Concern Present (10/16/2022)  Tobacco Use: High Risk (11/17/2022)    Readmission Risk Interventions     No data to display

## 2022-12-05 NOTE — Progress Notes (Addendum)
Advanced Heart Failure Rounding Note  PCP-Cardiologist: Fransico Him, MD   Subjective:   Cath 3/14 c/w nonischemic cardiomyopathy, filling pressures minimally elevated on RHC. Excellent cardiac output by Fick. Extubated 3/15. Reintubated (3/16). Now s/p trach 3/25  No further VT/VF/torsades  This morning stable on trach collar. . Continues w/ tube feeds. Diuresed yesterday with 40 IV lasix, net - 111mL + 2 unmeasured urine occurrences.  Weight down 3lbs.   Objective:   Weight Range: 39.5 kg Body mass index is 15.43 kg/m.   Vital Signs:   Temp:  [97.9 F (36.6 C)-99.3 F (37.4 C)] 99.3 F (37.4 C) (04/02 0757) Pulse Rate:  [69-117] 69 (04/02 0800) Resp:  [20-37] 24 (04/02 0800) BP: (86-122)/(61-91) 90/61 (04/02 0800) SpO2:  [85 %-100 %] 96 % (04/02 0800) FiO2 (%):  [40 %-100 %] 60 % (04/02 0800) Weight:  [39.5 kg] 39.5 kg (04/02 0346) Last BM Date : 12/03/22  Weight change: Filed Weights   12/03/22 0339 12/04/22 0500 12/05/22 0346  Weight: 39.1 kg 41 kg 39.5 kg   Intake/Output:   Intake/Output Summary (Last 24 hours) at 12/05/2022 0831 Last data filed at 12/05/2022 0800 Gross per 24 hour  Intake 1258.6 ml  Output 1375 ml  Net -116.4 ml   Physical Exam   General:  chronically ill appearing, thin AAF   HEENT: +trach collar, +cortrak Neck: supple. JVD ~5 cm. Carotids 2+ bilat; no bruits. No lymphadenopathy or thyromegaly appreciated. Cor: PMI nondisplaced. Regular rate & rhythm. No rubs, gallops or murmurs. Lungs: clear Abdomen: soft, nontender, nondistended. No hepatosplenomegaly. No bruits or masses. Good bowel sounds. GU: purewick Extremities: no cyanosis, clubbing, rash, edema  Neuro: nods appropriately, follows commands  Telemetry   Paced 80s/90s No further VT/VF/torsades (Personally reviewed)    Labs    CBC Recent Labs    12/04/22 0247 12/05/22 0152  WBC 9.1  9.1 11.1*  NEUTROABS 5.3  --   HGB 11.4*  11.3* 11.1*  HCT 35.7*  35.6* 35.4*   MCV 98.1  97.5 98.6  PLT 686*  705* [id number]*   Basic Metabolic Panel Recent Labs    12/04/22 0247 12/05/22 0152  NA 139 142  K 5.2* 4.6  CL 97* 103  CO2 27 25  GLUCOSE 131* 144*  BUN 48* 43*  CREATININE 0.85 0.76  CALCIUM 9.5 9.4   Liver Function Tests No results for input(s): "AST", "ALT", "ALKPHOS", "BILITOT", "PROT", "ALBUMIN" in the last 72 hours.  No results for input(s): "LIPASE", "AMYLASE" in the last 72 hours. Cardiac Enzymes No results for input(s): "CKTOTAL", "CKMB", "CKMBINDEX", "TROPONINI" in the last 72 hours.  BNP: BNP (last 3 results) Recent Labs    11/11/22 1238 11/17/22 2150  BNP 58.5 145.2*   ProBNP (last 3 results) No results for input(s): "PROBNP" in the last 8760 hours.  D-Dimer No results for input(s): "DDIMER" in the last 72 hours. Hemoglobin A1C No results for input(s): "HGBA1C" in the last 72 hours.  Fasting Lipid Panel No results for input(s): "CHOL", "HDL", "LDLCALC", "TRIG", "CHOLHDL", "LDLDIRECT" in the last 72 hours. Thyroid Function Tests No results for input(s): "TSH", "T4TOTAL", "T3FREE", "THYROIDAB" in the last 72 hours.  Invalid input(s): "FREET3"  Other results:  Imaging   No results found.  Medications:     Scheduled Medications:  carbamide peroxide  5 drop Right EAR BID   Chlorhexidine Gluconate Cloth  6 each Topical Daily   clonazePAM  1 mg Per Tube Q8H   enoxaparin (LOVENOX) injection  30 mg Subcutaneous Q24H   feeding supplement (PROSource TF20)  60 mL Per Tube Daily   fiber supplement (BANATROL TF)  60 mL Per Tube TID   insulin aspart  0-9 Units Subcutaneous Q4H   levothyroxine  112 mcg Per Tube QAC breakfast   mouth rinse  15 mL Mouth Rinse Q2H   pantoprazole (PROTONIX) IV  40 mg Intravenous QHS   QUEtiapine  25 mg Per Tube QHS   scopolamine  1 patch Transdermal Q72H   sertraline  75 mg Per Tube Daily   sodium chloride flush  10-40 mL Intracatheter Q12H   sodium chloride flush  3 mL Intravenous Q12H     Infusions:  sodium chloride Stopped (12/02/22 2226)   dexmedetomidine (PRECEDEX) IV infusion     feeding supplement (OSMOLITE 1.5 CAL) 50 mL/hr at 12/05/22 0800    PRN Medications: sodium chloride, acetaminophen, diphenhydrAMINE, docusate, guaiFENesin-codeine, ipratropium-albuterol, lidocaine, lip balm, melatonin, mouth rinse, oxyCODONE, polyethylene glycol, sodium chloride flush, sodium chloride flush  Patient Profile   52 y.o. female with a hx of tobacco use, COPD, myotonic dystrophy type I since age 4, high grade heart block s/p PPM, RBBB, sickle cell trait, and DM1 (questionable diagnosis) and strong family history of heart failure (father s/p cardiac transplant and paternal aunt w/ NICM w/ LVAD), admitted w/ acute hypoxic respiratory failure/ LLL CAP secondary to metapneumovirus. Hospital course c/b VF arrest. Post arrest echo w/ severe BiV failure, LVEF < 20%, RV severely reduced + diffuse HK   Assessment/Plan  1. VF arrest: 3/12, shocked. 15 minutes CPR total.  - Due to long-short interval (R-on-T) in the setting of QT-prolonging drugs (azithro) and hypokalemia - Pacemaker lower rate limit increased to 90 to prevent pause-dependent TdP; now down to 70 - Per EP: may need primary prevention ICD if EF does not recover with GDMT. VT this admission was attributable to a reversible cause, so ICD not indicated urgently for secondary prevention. Can consider placement of LifeVest on discharge but given stability of rhythm and need for chronic trach care would likely defer. - No arrhythmias on tele - Keep K> 4.0 Mg > 2.0 - avoid QT prolonging agents  2. Acute hypoxemic respiratory failure: Intubated post-VF arrest.  Had viral PNA with metapneumovirus at presentation.  - extubated 3/14 Reintubated 3/16 - Failed multiple SBTs - Trached and bronch 3/25 - CCM managing.   3. Acute systolic CHF: Prior workup for cardiac sarcoidosis. Cardiac MRI in 2017 showed subendocardial LGE in the mid  inferior and inferolateral walls with EF 56%.  Cardiac PET done after this was NOT suggestive of cardiac sarcoidosis.  - Echo in 5/22 with normal EF.   - Echo this admission reviewed, EF 20-25% range with diffuse hypokinesis but relatively preserved function at the apex, moderate RV dysfunction.  - cath 3/15 no CAD - suspect due to myotonic dystrophy type 1 -> she has an aunt with LVAD who has cardiomyopathy due to myotonic dystrophy.  Father had a heart transplant, uncertain of underlying condition. R/LHC c/w Nonischemic cardiomyopathy.  - Diuresed yesterday with 40 IV lasix x1. -1.3L.  Down 3lbs. No central access for CVP monitoring. SBPs soft, 90s. No further diuresis today - Restart spiro 12.5 mg daily, monitor K - Eventual SGLT2i, foley now removed with purewick. Needs to be more mobile. - BP too soft currently for ARNi.  - Given overall debility, she is not currently a candidate for advanced therapies - Would consider palliative care input    4. High  grade heart block: Has MDT PPM with His bundle lead.  This is likely due to cardiac involvement of myotonic dystrophy type 1.  - plan as above  5. Myotonic dystrophy: Type 1, diagnosed at age 76. Has family members with the same diagnosis.  Followed by neurology.   6. Tobacco abuse - needs cessation  7. Hypokalemia/hypomag - K 4.6 - restart spiro today, monitor K  - follow BMP   8. Anemia of chronic illness - s/p 1uPRBC this admit - hgb stable today at 11.1   Length of Stay: [place], NP  12/05/2022, 8:31 AM  Advanced Heart Failure Team Pager [phone] (M-F; 7a - 5p)  Please contact [facility] for night-coverage after hours (5p -7a ) and weekends on [email or web address]  Patient seen with NP, agree with the above note.   Trach collar currently, had to go back on the vent last night as she tired overnight.   SBP 90s, no complaints.   General: NAD, thin.  Neck:  Tracheostomy. No JVD, no thyromegaly or thyroid nodule.   Lungs: Clear to auscultation bilaterally with normal respiratory effort. CV: Nondisplaced PMI.  Heart regular S1/S2, no S3/S4, no murmur.  No peripheral edema.   Abdomen: Soft, nontender, no hepatosplenomegaly, no distention.  Skin: Intact without lesions or rashes.  Neurologic: Alert and oriented x 3.  Psych: Normal affect. Extremities: No clubbing or cyanosis.  HEENT: Normal.   She continues on trach collar during the day but has needed vent at night. Hopefully we can get her to the point of decannulation, if not will need LTAC.  Suspect muscle weakness from myotonic dystrophy plays a large role.    Nonischemic cardiomyopathy, likely from myotonic dystrophy.  Limited in terms of GDMT by soft BP.  Volume status looks ok.  - Restart spironolactone 12.5 daily today.  - SGLT2 inhibitor when foley out.   Loralie Champagne 12/05/2022 11:40 AM

## 2022-12-05 NOTE — Progress Notes (Signed)
Inpatient Rehab Admissions Coordinator:  Spoke with pt's cousin Rip Harbour. Discussed CIR goals and expectations. Discussed average length of stay, insurance authorization requirement and discharge home after completion of CIR. She acknowledged understanding. She is supportive of pt pursuing CIR. She confirmed that she, family and friends will be able to provide 24/7 support for pt after discharge.  Pt is not medically stable for potential CIR admission. Pt placed back on ventilator on 4/1. Will continue to follow.   Gayland Curry, [place], [place] Admissions Coordinator [phone]

## 2022-12-05 NOTE — Progress Notes (Signed)
Pt sitting in chair cpt held at this time

## 2022-12-05 NOTE — Progress Notes (Signed)
NAME:  Cynthia Underwood, MRN:  [id number], DOB:  02/09/1971, LOS: 64 ADMISSION DATE:  11/11/2022 CONSULTATION DATE:  11/14/2022 REFERRING MD:  Wyline Copas - TRH CHIEF COMPLAINT:  Vfib arrest on floor   History of Present Illness:   52 year old woman who initially presented to [facility] 3/9 for cough and progressive dyspnea x 4 days. Also reported diarrhea and 1 episode of epigastric pain with vomiting. PMHx significant for RBBB/high-grade heart block (s/p PPM), hypothyroidism, sickle cell trait, myotonic dystrophy (dx age 28), tobacco use.  Noted to be hypoxic in ED with O2 sat 88% on RA, CTA Chest c/f LLL PNA with mild bronchiectasis, negative for PE. Also c/f esophagitis. Patient was empirically started on CAP coverage (ceftriaxone, azithromycin) and GERD treatment (PPI/sucralfate). Also found to be positive for human metapneumovirus. Admitted to [facility].  Patient was initially progressing as expected 3/11-3/12 until ~1400, sat was noted to be in 80s. Placed on 5L humidified oxygen with slight improvement in sats. Solumedrol and DuoNebs were ordered. Around 1900 patient's RN was informed by telemetry of VF and was found unresponsive and pulseless. Code Blue was called with CPR x 15 minutes and ROSC. Amiodarone, Epi and defib were administered. Patient was intubated by anesthesia peri-code. IO was placed and Levophed was initiated. Cardiology was consulted.  PCCM was consulted for ICU admission post-arrest.  Pertinent Medical History:  Heart block, Goiter, Sickle cell trait, Myotonic dystrophy  Significant Hospital Events: Including procedures, antibiotic start and stop dates in addition to other pertinent events   3/9 - Presented to [facility] for LLL PNA. Started on Surveyor, quantity. +human metapneumovirus. 3/12 - Hypoxic requiring 5LNC. Later VF arrest on floor, CPR 3/13 echocardiogram LVEF severely depressed 50% (decreased compared with 01/2021), severely reduced RV function 3/14 Cardiac catheterization showed  normal coronaries, new normal filling pressures and normal PA pressure.  With excellent cardiac output 3/15 Extubated.  3/16 Reintubated for increased work of breathing, failed BiPAP 3/18 alert, interactive off vasopressors 3/20 Wean, trial extubation today  3/21 No acute issues overnight, failed early am wean due to apnea on 50 Fentanyl and 0.8 Pecedex  3/24 PRBC transfusion 3/25 Percutaneous tracheostomy 3/29 ATC trials during the day  Interim History / Subjective:   Patient was placed on ventilator again overnight due to hypoxemia and tachypnea.  She is lethargic this morning.  Objective:  Blood pressure 90/61, pulse 69, temperature 99.3 F (37.4 C), temperature source Oral, resp. rate (!) 24, height 5\' 3"  (1.6 m), weight 39.5 kg, last menstrual period 12/03/2016, SpO2 96 %.    Vent Mode: PRVC FiO2 (%):  [40 %-100 %] 60 % Set Rate:  [24 bmp] 24 bmp Vt Set:  [410 mL] 410 mL PEEP:  [5 cmH20] 5 cmH20 Plateau Pressure:  [18 cmH20-21 cmH20] 18 cmH20   Intake/Output Summary (Last 24 hours) at 12/05/2022 0903 Last data filed at 12/05/2022 0800 Gross per 24 hour  Intake 1199.7 ml  Output 1375 ml  Net -175.3 ml    Filed Weights   12/03/22 0339 12/04/22 0500 12/05/22 0346  Weight: 39.1 kg 41 kg 39.5 kg   Physical Exam: Physical Exam Constitutional:      General: She is not in acute distress.    Comments: Somnolent but awakes to verbal stimulation  HENT:     Head: Normocephalic.  Eyes:     General:        Right eye: No discharge.        Left eye: No discharge.  Cardiovascular:  Rate and Rhythm: Normal rate and regular rhythm.  Pulmonary:     Effort: No respiratory distress.     Breath sounds: Normal breath sounds.     Comments: She is on trach collar this morning Abdominal:     General: Bowel sounds are normal. There is no distension.     Palpations: Abdomen is soft.     Tenderness: There is no abdominal tenderness.  Musculoskeletal:     Right lower leg: No edema.      Left lower leg: No edema.  Skin:    General: Skin is warm.  Neurological:     Mental Status: Mental status is at baseline.       Resolved Hospital Problem List:  Septic shock from pneumonia, Cardiogenic shock from cardiac arrest, Shock liver in setting of cardiac arrest  Assessment & Plan:  Post-Vfib arrest Heart block s/p MDT PPM. New acute non ischemic systolic CHF with EF [id number]. Heart failure team is following.  -Resume spironolactone 12.5 mg -Redose IV lasix 40 mg (-114.2 cc ON). K 4.6 this AM -Hypotension is a barrier for adding GDMT  Acute hypoxic respiratory failure from cardiac arrest, CHF, Metapneumovirus pneumonia, HCAP, muscle weakness from myotonic dystrophy. S/p tracheostomy on 3/25 Hospital-acquired pneumonia SLP consult for PMV Trach collar as tolerated. S/p Cefepime.  BAL cultures from 3/25 shows normal flora   Acute metabolic encephalopathy from anoxia, sepsis. -Difficulty weaning off of Precedex due to agitation and anxiety.  -Seroquel 25 mg QHS  -Klonopin 1 mg TID 4/1 -Zoloft 75 mg daily 4/1   Thrombocytosis -Obtain blood smear  Hyperglycemia. SSI   GERD with esophagitis. Continue PPI   Hypothyroidism. Continue levothyroxine  Anemia of iron deficiency, critical illness and chronic disease. f/u CBC Transfuse for Hb < 7 Given Ferrlecit on Sunday 3/24 for iron supplementation  Severe malnutrition, present on admission Continue tube feeds.  Her core track was clogged so we will place a new one tomorrow.  Disposition Patient is unable to tolerate trach collar for > 24h.  She has been in the ICU for 23 days which can cause serious deconditioning in the setting of myotonic dystrophy.  Her sister agrees that [place] would be her next best option and she is working with [facility] for placement.  Our case manager is also contacting [facility] as well.  Her sister will be here on Thursday.  Best Practice: (right click and "Reselect all SmartList Selections"  daily)   Diet/type: tubefeeds DVT prophylaxis: Lovenox GI prophylaxis: PPI Lines:NA Foley:  NA Code Status: Full Last date of multidisciplinary goals of care discussion [updated sister via phone 4/1] Signature:   Gaylan Gerold, DO Internal Medicine Residency My pager: [phone]

## 2022-12-05 NOTE — Progress Notes (Signed)
[facility] Progress Note Patient Name: Cynthia Underwood DOB: [birth year] MRN: [id number]   Date of Service  12/05/2022  HPI/Events of Note  Urinary retention - Bladder scan with 900 mL.   eICU Interventions  Plan: I/O Cath PRN.     Intervention Category Major Interventions: Other:  Lysle Dingwall 12/05/2022, 11:05 PM

## 2022-12-05 NOTE — Progress Notes (Signed)
Attempted to give 0600 meds and found core track to be clogged. Attempted to unclog and was unsuccessful. Will be holding 0600 meds. Notified elink.

## 2022-12-05 NOTE — Progress Notes (Signed)
eLink Physician-Brief Progress Note Patient Name: Cynthia Underwood DOB: 1971/02/25 MRN: [id number]   Date of Service  12/05/2022  HPI/Events of Note  Cough - Unable to get ordered Guaifenesin/Codeine per tube d/t loss of CorTrak tube today. Plan is to replace CorTrak tomorrow. QTc interval = 0.36 seconds.   eICU Interventions  Plan: Phenergan 6.25 mg IV X 1.      Intervention Category Major Interventions: Other:  Lysle Dingwall 12/05/2022, 10:28 PM

## 2022-12-06 ENCOUNTER — Inpatient Hospital Stay (HOSPITAL_COMMUNITY): Payer: 59

## 2022-12-06 ENCOUNTER — Other Ambulatory Visit (HOSPITAL_COMMUNITY): Payer: 59

## 2022-12-06 ENCOUNTER — Inpatient Hospital Stay
Admission: AD | Admit: 2022-12-06 | Discharge: 2023-01-19 | Disposition: A | Discharge status: Home / Self Care | Payer: 59 | Source: Other Acute Inpatient Hospital | Attending: Internal Medicine | Admitting: Internal Medicine

## 2022-12-06 DIAGNOSIS — Z8674 Personal history of sudden cardiac arrest: Secondary | ICD-10-CM | POA: Diagnosis not present

## 2022-12-06 DIAGNOSIS — G7111 Myotonic muscular dystrophy: Secondary | ICD-10-CM | POA: Diagnosis not present

## 2022-12-06 DIAGNOSIS — J9621 Acute and chronic respiratory failure with hypoxia: Secondary | ICD-10-CM | POA: Diagnosis not present

## 2022-12-06 DIAGNOSIS — D72829 Elevated white blood cell count, unspecified: Secondary | ICD-10-CM | POA: Diagnosis not present

## 2022-12-06 DIAGNOSIS — L89309 Pressure ulcer of unspecified buttock, unspecified stage: Secondary | ICD-10-CM | POA: Diagnosis not present

## 2022-12-06 DIAGNOSIS — I110 Hypertensive heart disease with heart failure: Secondary | ICD-10-CM | POA: Diagnosis not present

## 2022-12-06 DIAGNOSIS — G9341 Metabolic encephalopathy: Secondary | ICD-10-CM | POA: Diagnosis not present

## 2022-12-06 DIAGNOSIS — E43 Unspecified severe protein-calorie malnutrition: Secondary | ICD-10-CM | POA: Diagnosis not present

## 2022-12-06 DIAGNOSIS — R918 Other nonspecific abnormal finding of lung field: Secondary | ICD-10-CM | POA: Diagnosis not present

## 2022-12-06 DIAGNOSIS — F17210 Nicotine dependence, cigarettes, uncomplicated: Secondary | ICD-10-CM | POA: Diagnosis not present

## 2022-12-06 DIAGNOSIS — Z743 Need for continuous supervision: Secondary | ICD-10-CM | POA: Diagnosis not present

## 2022-12-06 DIAGNOSIS — I5043 Acute on chronic combined systolic (congestive) and diastolic (congestive) heart failure: Secondary | ICD-10-CM | POA: Diagnosis not present

## 2022-12-06 DIAGNOSIS — B952 Enterococcus as the cause of diseases classified elsewhere: Secondary | ICD-10-CM | POA: Diagnosis not present

## 2022-12-06 DIAGNOSIS — K8020 Calculus of gallbladder without cholecystitis without obstruction: Secondary | ICD-10-CM | POA: Diagnosis not present

## 2022-12-06 DIAGNOSIS — J9811 Atelectasis: Secondary | ICD-10-CM | POA: Diagnosis not present

## 2022-12-06 DIAGNOSIS — I5033 Acute on chronic diastolic (congestive) heart failure: Secondary | ICD-10-CM | POA: Diagnosis not present

## 2022-12-06 DIAGNOSIS — Z4659 Encounter for fitting and adjustment of other gastrointestinal appliance and device: Secondary | ICD-10-CM | POA: Diagnosis not present

## 2022-12-06 DIAGNOSIS — E039 Hypothyroidism, unspecified: Secondary | ICD-10-CM | POA: Diagnosis not present

## 2022-12-06 DIAGNOSIS — K56609 Unspecified intestinal obstruction, unspecified as to partial versus complete obstruction: Secondary | ICD-10-CM | POA: Diagnosis not present

## 2022-12-06 DIAGNOSIS — J189 Pneumonia, unspecified organism: Secondary | ICD-10-CM | POA: Diagnosis not present

## 2022-12-06 DIAGNOSIS — J9601 Acute respiratory failure with hypoxia: Secondary | ICD-10-CM | POA: Diagnosis not present

## 2022-12-06 DIAGNOSIS — Z931 Gastrostomy status: Secondary | ICD-10-CM | POA: Diagnosis not present

## 2022-12-06 DIAGNOSIS — Z930 Tracheostomy status: Secondary | ICD-10-CM | POA: Diagnosis not present

## 2022-12-06 DIAGNOSIS — Z681 Body mass index (BMI) 19 or less, adult: Secondary | ICD-10-CM | POA: Diagnosis not present

## 2022-12-06 DIAGNOSIS — D649 Anemia, unspecified: Secondary | ICD-10-CM | POA: Diagnosis not present

## 2022-12-06 DIAGNOSIS — Z431 Encounter for attention to gastrostomy: Secondary | ICD-10-CM | POA: Diagnosis not present

## 2022-12-06 DIAGNOSIS — Z430 Encounter for attention to tracheostomy: Secondary | ICD-10-CM | POA: Diagnosis not present

## 2022-12-06 DIAGNOSIS — R1312 Dysphagia, oropharyngeal phase: Secondary | ICD-10-CM | POA: Diagnosis not present

## 2022-12-06 DIAGNOSIS — R059 Cough, unspecified: Secondary | ICD-10-CM | POA: Diagnosis not present

## 2022-12-06 DIAGNOSIS — F411 Generalized anxiety disorder: Secondary | ICD-10-CM | POA: Insufficient documentation

## 2022-12-06 DIAGNOSIS — Z4682 Encounter for fitting and adjustment of non-vascular catheter: Secondary | ICD-10-CM | POA: Diagnosis not present

## 2022-12-06 DIAGNOSIS — R1310 Dysphagia, unspecified: Secondary | ICD-10-CM | POA: Diagnosis not present

## 2022-12-06 DIAGNOSIS — K567 Ileus, unspecified: Secondary | ICD-10-CM | POA: Diagnosis not present

## 2022-12-06 DIAGNOSIS — R0602 Shortness of breath: Secondary | ICD-10-CM | POA: Diagnosis not present

## 2022-12-06 DIAGNOSIS — N281 Cyst of kidney, acquired: Secondary | ICD-10-CM | POA: Diagnosis not present

## 2022-12-06 DIAGNOSIS — G931 Anoxic brain damage, not elsewhere classified: Secondary | ICD-10-CM | POA: Diagnosis not present

## 2022-12-06 DIAGNOSIS — J690 Pneumonitis due to inhalation of food and vomit: Secondary | ICD-10-CM | POA: Diagnosis not present

## 2022-12-06 DIAGNOSIS — E46 Unspecified protein-calorie malnutrition: Secondary | ICD-10-CM | POA: Diagnosis not present

## 2022-12-06 DIAGNOSIS — B965 Pseudomonas (aeruginosa) (mallei) (pseudomallei) as the cause of diseases classified elsewhere: Secondary | ICD-10-CM | POA: Diagnosis not present

## 2022-12-06 DIAGNOSIS — J9600 Acute respiratory failure, unspecified whether with hypoxia or hypercapnia: Secondary | ICD-10-CM | POA: Diagnosis not present

## 2022-12-06 DIAGNOSIS — J9690 Respiratory failure, unspecified, unspecified whether with hypoxia or hypercapnia: Secondary | ICD-10-CM | POA: Diagnosis not present

## 2022-12-06 DIAGNOSIS — R6889 Other general symptoms and signs: Secondary | ICD-10-CM | POA: Diagnosis not present

## 2022-12-06 DIAGNOSIS — I420 Dilated cardiomyopathy: Secondary | ICD-10-CM | POA: Diagnosis not present

## 2022-12-06 DIAGNOSIS — Z950 Presence of cardiac pacemaker: Secondary | ICD-10-CM | POA: Diagnosis not present

## 2022-12-06 LAB — BLOOD GAS, ARTERIAL
Acid-Base Excess: 3.8 mmol/L — ABNORMAL HIGH (ref 0.0–2.0)
Bicarbonate: 25.7 mmol/L (ref 20.0–28.0)
O2 Saturation: 96.4 %
Patient temperature: 36.8
pCO2 arterial: 30 mmHg — ABNORMAL LOW (ref 32–48)
pH, Arterial: 7.54 — ABNORMAL HIGH (ref 7.35–7.45)
pO2, Arterial: 72 mmHg — ABNORMAL LOW (ref 83–108)

## 2022-12-06 LAB — CBC
HCT: 43.6 % (ref 36.0–46.0)
Hemoglobin: 13.5 g/dL (ref 12.0–15.0)
MCH: 31 pg (ref 26.0–34.0)
MCHC: 31 g/dL (ref 30.0–36.0)
MCV: 100.2 fL — ABNORMAL HIGH (ref 80.0–100.0)
Platelets: 849 10*3/uL — ABNORMAL HIGH (ref 150–400)
RBC: 4.35 MIL/uL (ref 3.87–5.11)
RDW: 15.9 % — ABNORMAL HIGH (ref 11.5–15.5)
WBC: 15.4 10*3/uL — ABNORMAL HIGH (ref 4.0–10.5)
nRBC: 0 % (ref 0.0–0.2)

## 2022-12-06 LAB — BASIC METABOLIC PANEL
Anion gap: 17 — ABNORMAL HIGH (ref 5–15)
BUN: 37 mg/dL — ABNORMAL HIGH (ref 6–20)
CO2: 27 mmol/L (ref 22–32)
Calcium: 10.3 mg/dL (ref 8.9–10.3)
Chloride: 101 mmol/L (ref 98–111)
Creatinine, Ser: 0.85 mg/dL (ref 0.44–1.00)
GFR, Estimated: >60 mL/min (ref >60)
Glucose, Bld: 117 mg/dL — ABNORMAL HIGH (ref 70–99)
Potassium: 3.3 mmol/L — ABNORMAL LOW (ref 3.5–5.1)
Sodium: 145 mmol/L (ref 135–145)

## 2022-12-06 LAB — GLUCOSE, CAPILLARY
Glucose-Capillary: 117 mg/dL — ABNORMAL HIGH (ref 70–99)
Glucose-Capillary: 123 mg/dL — ABNORMAL HIGH (ref 70–99)
Glucose-Capillary: 136 mg/dL — ABNORMAL HIGH (ref 70–99)
Glucose-Capillary: 144 mg/dL — ABNORMAL HIGH (ref 70–99)
Glucose-Capillary: 95 mg/dL (ref 70–99)

## 2022-12-06 MED ORDER — POTASSIUM CHLORIDE 10 MEQ/100ML IV SOLN
10.0000 meq | INTRAVENOUS | Status: AC
  Administered 2022-12-06 (×4): 10 meq via INTRAVENOUS
  Filled 2022-12-06 (×4): qty 100

## 2022-12-06 MED ORDER — QUETIAPINE FUMARATE 25 MG PO TABS: 25.0000 mg | ORAL_TABLET | Freq: Every day | ORAL | Status: DC

## 2022-12-06 MED ORDER — POTASSIUM CHLORIDE 10 MEQ/100ML IV SOLN: 10.0000 meq | INTRAVENOUS | Status: DC

## 2022-12-06 MED ORDER — ONDANSETRON HCL 4 MG/2ML IJ SOLN
4.0000 mg | Freq: Once | INTRAMUSCULAR | Status: AC
  Administered 2022-12-06: 4 mg via INTRAVENOUS
  Filled 2022-12-06: qty 2

## 2022-12-06 MED ORDER — PROSOURCE TF20 ENFIT COMPATIBL EN LIQD: 60.0000 mL | Freq: Every day | ENTERAL | Status: DC

## 2022-12-06 MED ORDER — MIDAZOLAM HCL 2 MG/2ML IJ SOLN
1.0000 mg | Freq: Once | INTRAMUSCULAR | Status: AC
  Administered 2022-12-06: 1 mg via INTRAVENOUS
  Filled 2022-12-06: qty 2

## 2022-12-06 MED ORDER — CLONAZEPAM 1 MG PO TABS: 1.0000 mg | ORAL_TABLET | Freq: Three times a day (TID) | ORAL | 0 refills | Status: DC

## 2022-12-06 MED ORDER — LEVOTHYROXINE SODIUM 112 MCG PO TABS: 112.0000 ug | ORAL_TABLET | Freq: Every day | ORAL | Status: DC

## 2022-12-06 MED ORDER — SPIRONOLACTONE 25 MG PO TABS: 12.5000 mg | ORAL_TABLET | Freq: Every day | ORAL | Status: DC

## 2022-12-06 MED ORDER — FUROSEMIDE 10 MG/ML IJ SOLN
40.0000 mg | Freq: Once | INTRAMUSCULAR | Status: AC
  Administered 2022-12-06: 40 mg via INTRAVENOUS
  Filled 2022-12-06: qty 4

## 2022-12-06 MED ORDER — PANTOPRAZOLE SODIUM 40 MG IV SOLR: 40.0000 mg | Freq: Every day | INTRAVENOUS | Status: DC

## 2022-12-06 MED ORDER — SERTRALINE HCL 25 MG PO TABS: 75.0000 mg | ORAL_TABLET | Freq: Every day | ORAL | Status: DC

## 2022-12-06 MED ORDER — OSMOLITE 1.5 CAL PO LIQD: 1000.0000 mL | ORAL | 0 refills | Status: DC

## 2022-12-06 MED ORDER — FENTANYL CITRATE PF 50 MCG/ML IJ SOSY
12.5000 ug | PREFILLED_SYRINGE | INTRAMUSCULAR | Status: AC | PRN
  Administered 2022-12-06 (×2): 12.5 ug via INTRAVENOUS
  Filled 2022-12-06 (×2): qty 1

## 2022-12-06 MED ORDER — POTASSIUM CHLORIDE 20 MEQ PO PACK
40.0000 meq | PACK | Freq: Once | ORAL | Status: AC
  Administered 2022-12-06: 40 meq
  Filled 2022-12-06: qty 2

## 2022-12-06 NOTE — Progress Notes (Signed)
Pt being prepped to transfer to [facility], CPT held at this time, pt appears comfortable and no distress noted.

## 2022-12-06 NOTE — Procedures (Signed)
Cortrak  Person Inserting Tube:  Liesa Tsan T, RD Tube Type:  Cortrak - 43 inches Tube Size:  10 Tube Location:  Left nare Secured by: Bridle Technique Used to Measure Tube Placement:  Marking at nare/corner of mouth Cortrak Secured At:  64 cm   Cortrak Tube Team Note:  Consult received to place a Cortrak feeding tube.   X-ray is required, abdominal x-ray has been ordered by the Cortrak team. Please confirm tube placement before using the Cortrak tube.   If the tube becomes dislodged please keep the tube and contact the Cortrak team at [email or web address] for replacement.  If after hours and replacement cannot be delayed, place a NG tube and confirm placement with an abdominal x-ray.    Samson Frederic RD, LDN For contact information, refer to [facility].

## 2022-12-06 NOTE — Progress Notes (Signed)
eLink Physician-Brief Progress Note Patient Name: Cynthia Underwood DOB: 04-17-1971 MRN: [id number]   Date of Service  12/06/2022  HPI/Events of Note  Patient c/o 10/10 back pain. Lost CorTrak and unable to give ordered oxycodone IR per tube PRN pain medication.   eICU Interventions  Plan: Fentanyl 12.5 mcg IV Q 4 hours PRN severe pain X 2 doses.     Intervention Category Major Interventions: Other:  Lysle Dingwall 12/06/2022, 2:34 AM

## 2022-12-06 NOTE — Progress Notes (Signed)
[facility] Progress Note Patient Name: Cynthia Underwood DOB: [birth year] MRN: [id number]   Date of Service  12/06/2022  HPI/Events of Note  Hypokalemia - K+ = 3.3 and Creatinine = 0.85.   eICU Interventions  Will replace K+.       Intervention Category Major Interventions: Electrolyte abnormality - evaluation and management  Dannielle Baskins Eugene 12/06/2022, 1:58 AM

## 2022-12-06 NOTE — Progress Notes (Signed)
Came back to room after RN cleaned pt up.  Pt appears comfortable, no distress noted.  Pt placed back on ATC per earlier discussion w/ CCM to trial pt on ATC again today.  Per RN, she just sx pt for clear secretions.    Report called to [facility] RT

## 2022-12-06 NOTE — Congregational Nurse Program (Signed)
0300 patient very restless since taking over her care at 1 am patient hitting staff that are try ing to help her hanging over the bed spitting on the floor not using yaunker hitting staff when they try to position TC so patient gets needed oxygen and when staff want to suction patient for desat in sp02. Spoke with patient sister so she could talk to patient about behavior 0500 patient remains restless not pulling trach collar from around trach sp02 keeps decreasing education not successful with patient 02 increased to get sp02 levels above 90% patient keeps taking off sp02 rolling in the bed unable to keep lines and TC on patient for her benefit.

## 2022-12-06 NOTE — Discharge Summary (Signed)
Name: Cynthia Underwood MRN: [id number] DOB: 03/16/1971 52 y.o. PCP: Charlott Rakes, MD  Date of Admission: 11/11/2022 12:01 PM Date of Discharge: 12/06/2022 Attending Physician: Lanier Clam, MD  Discharge Diagnosis: 1. Principal Problem:   Acute hypoxemic respiratory failure Active Problems:   Myotonic dystrophy, type 1 (HCC)   Symptomatic advanced heart block   Presence of permanent cardiac pacemaker   Hypothyroidism   CAP (community acquired pneumonia)   Cardiac arrest   Acute on chronic combined systolic and diastolic CHF (congestive heart failure)   Protein-calorie malnutrition, severe   Drug-induced torsades de pointes   Generalized anxiety disorder   Discharge Medications: Allergies as of 12/06/2022       Reactions   Penicillin G Other (See Comments), Rash, Nausea And Vomiting   Sore throat   Penicillins Nausea And Vomiting, Rash, Other (See Comments)   Fever Has patient had a PCN reaction causing immediate rash, facial/tongue/throat swelling, SOB or lightheadedness with hypotension: Yes Has patient had a PCN reaction causing severe rash involving mucus membranes or skin necrosis: Unknown Has patient had a PCN reaction that required hospitalization: No Has patient had a PCN reaction occurring within the last 10 years: No If all of the above answers are "NO", then may proceed with Cephalosporin use.        Medication List     STOP taking these medications    acetaminophen 500 MG tablet Commonly known as: TYLENOL   albuterol 108 (90 Base) MCG/ACT inhaler Commonly known as: VENTOLIN HFA   hydrocortisone 2.5 % rectal cream Commonly known as: ANUSOL-HC   ibuprofen 200 MG tablet Commonly known as: ADVIL   Misc. Devices Misc       TAKE these medications    clonazePAM 1 MG tablet Commonly known as: KLONOPIN Place 1 tablet (1 mg total) into feeding tube every 8 (eight) hours.   feeding supplement (OSMOLITE 1.5 CAL) Liqd Place 1,000 mLs into  feeding tube continuous.   feeding supplement (PROSource TF20) liquid Place 60 mLs into feeding tube daily. Start taking on: December 07, 2022   levothyroxine 112 MCG tablet Commonly known as: SYNTHROID Place 1 tablet (112 mcg total) into feeding tube daily before breakfast. Start taking on: December 07, 2022 What changed: how to take this   pantoprazole 40 MG injection Commonly known as: PROTONIX Inject 40 mg into the vein at bedtime.   QUEtiapine 25 MG tablet Commonly known as: SEROQUEL Place 1 tablet (25 mg total) into feeding tube at bedtime.   sertraline 25 MG tablet Commonly known as: ZOLOFT Place 3 tablets (75 mg total) into feeding tube daily. Start taking on: December 07, 2022   spironolactone 25 MG tablet Commonly known as: ALDACTONE Place 0.5 tablets (12.5 mg total) into feeding tube daily. Start taking on: December 07, 2022        Disposition and follow-up:   Ms.Darolyn H Lawhead was discharged from [facility] in Stable condition.  At the hospital follow up visit please address:  1.    Acute hypoxemic respiratory failure -Continue to wean off of ventilator   Nonischemic cardiomyopathy EF 20% -Following with heart failure team -Currently on spironolactone 12.5 mg  -Can start losartan if blood pressure allows. -Consider SGLT2 inhibitor if she no longer requires a Foley catheter -Diuresis with IV 40 Lasix mg daily  Severe protein calorie malnutrition -Currently has a core track. -Please discuss with family regarding a PEG tube  Anxiety -Patient is off of Precedex drip -Continue  Seroquel and Klonopin.  Can uptitrate if needed  Myotonic dystrophy -Follow-up with neurology  2.  Labs / imaging needed at time of follow-up: CBC, CMP  3.  Pending labs/ test needing follow-up: NA  Follow-up Appointments:   Hospital Course  Cynthia Underwood is a 52 year old woman living with myotonic dystrophy type I, sickle cell trait, advanced heart block with  pacemaker placement, who was admitted for acute hypoxic respiratory failure from CAP and suffered from V-fib arrest.   Acute hypoxic respiratory failure  Metapneumovirus pneumonia HCAP Patient presented to the ED on 3/9 for coughing and progressive dyspnea x 4 days.  Initial CTA chest on admission showed left lower lobe pneumonia and negative for PE.  She was started on ceftriaxone and azithromycin.  She was also tested positive for human metapneumovirus.  She was originally satting well on 5 L humidified oxygen.  Around 1900 on 3/12, patient was found to be in ventricular fibrillation with pulselessness.  CODE BLUE was called.  ROSC obtained after 15 minutes of CPR.  She received amiodarone, epinephrine and defibrillation.  Patient was intubated and transferred to the ICU.  She received 6 days of ceftriaxone and 6 more days of cefepime for HCAP.  Sputum culture and BAL culture all grew normal respiratory flora.  Patient was extubated on 3/15 but was reintubated on 3/16 due to increased work of breathing and failed BiPAP.  She had a percutaneous tracheostomy on 3/25 due to inability to wean off of ventilator.  Patient was on intermittent trach collar but could not tolerate more than 12 hours.  She will continue ventilator we[facility] at [facility].  Acute non ischemic systolic CHF with EF [id number].  Post-Vfib arrest Heart block s/p MDT PPM. Postarrest echocardiogram shows severe biventricular failure with EF < 20% and severely reduced RV function.  Right/left heart cath showed normal coronaries with good cardiac output.  Suspect that her myotonic dystrophy also contributes to this nonischemic cardiomyopathy.  GDMT was limited due to her hypotension.  Patient was started on spironolactone and diuresed with IV Lasix.  Per cardiology, she is not currently a candidate for advanced therapy.  Per EP, an urgent ICD was not indicated because her V-fib was likely medication related.  She will need a ICD in the near  future if her EF does not recover with GDMT.   Pertinent Labs, Studies, and Procedures:     Latest Ref Rng & Units 12/06/2022   12:51 AM 12/05/2022    1:52 AM 12/04/2022    2:47 AM  CBC  WBC 4.0 - 10.5 K/uL 15.4  11.1  9.1    9.1   Hemoglobin 12.0 - 15.0 g/dL 13.5  11.1  11.3    11.4   Hematocrit 36.0 - 46.0 % 43.6  35.4  35.6    35.7   Platelets 150 - 400 K/uL 849  605  705    686       Latest Ref Rng & Units 12/06/2022   12:51 AM 12/05/2022    1:52 AM 12/04/2022    2:47 AM  CMP  Glucose 70 - 99 mg/dL 117  144  131   BUN 6 - 20 mg/dL 37  43  48   Creatinine 0.44 - 1.00 mg/dL 0.85  0.76  0.85   Sodium 135 - 145 mmol/L 145  142  139   Potassium 3.5 - 5.1 mmol/L 3.3  4.6  5.2   Chloride 98 - 111 mmol/L 101  103  97  CO2 22 - 32 mmol/L 27  25  27    Calcium 8.9 - 10.3 mg/dL 10.3  9.4  9.5    CT Angio Chest PE W and/or Wo Contrast  Result Date: 11/11/2022 CLINICAL DATA:  Cough, weakness, vomiting for 4 days.  Chest pain. EXAM: CT ANGIOGRAPHY CHEST WITH CONTRAST TECHNIQUE: Multidetector CT imaging of the chest was performed using the standard protocol during bolus administration of intravenous contrast. Multiplanar CT image reconstructions and MIPs were obtained to evaluate the vascular anatomy. RADIATION DOSE REDUCTION: This exam was performed according to the departmental dose-optimization program which includes automated exposure control, adjustment of the mA and/or kV according to patient size and/or use of iterative reconstruction technique. CONTRAST:  24mL OMNIPAQUE IOHEXOL 350 MG/ML SOLN COMPARISON:  CT chest 12/28/2016 FINDINGS: Cardiovascular: Satisfactory opacification of the pulmonary arteries to the segmental level. No evidence of pulmonary embolism. Normal heart size. Small pericardial effusion. Left chest wall pacemaker. Mediastinum/Nodes: Streak artifact from the pacemaker obscures the upper mediastinum. Small amount of fluid in the esophagus which is otherwise unremarkable. No  thoracic adenopathy. Lungs/Pleura: Bibasilar atelectasis/scarring. There are few patchy ground-glass opacities in the right middle lobe and bilateral lower lobes. No pleural effusion or pneumothorax. Mild lower lobe bronchial wall thickening and bronchiolectasis. Upper Abdomen: No acute abnormality. Musculoskeletal: No chest wall abnormality. No acute osseous findings. Review of the MIP images confirms the above findings. IMPRESSION: 1. No evidence of pulmonary embolism. 2. Few patchy ground-glass opacities in the right middle lobe and bilateral lower lobes, likely infectious/inflammatory. 3. Mild lower lobe bronchial wall thickening and bronchiolectasis. 4. Small amount of fluid in the esophagus. Correlate for esophageal reflux. 5. Small pericardial effusion. Electronically Signed   By: Placido Sou M.D.   On: 11/11/2022 19:11   DG Chest Port 1 View  Result Date: 11/11/2022 CLINICAL DATA:  Cough. EXAM: PORTABLE CHEST 1 VIEW COMPARISON:  08/27/2018 FINDINGS: The lungs are clear without focal pneumonia, edema, pneumothorax or pleural effusion. The cardiopericardial silhouette is within normal limits for size. Left permanent pacemaker again noted. Telemetry leads overlie the chest. IMPRESSION: No active disease. Electronically Signed   By: Misty Stanley M.D.   On: 11/11/2022 13:02     Discharge Instructions: Discharge Instructions     Call MD for:  difficulty breathing, headache or visual disturbances   Complete by: As directed    Call MD for:  hives   Complete by: As directed    Call MD for:  persistant nausea and vomiting   Complete by: As directed    Call MD for:  redness, tenderness, or signs of infection (pain, swelling, redness, odor or green/yellow discharge around incision site)   Complete by: As directed    Call MD for:  severe uncontrolled pain   Complete by: As directed    Call MD for:  temperature >100.4   Complete by: As directed    Discharge instructions   Complete by: As  directed    Mr. Anglen,  It was a pleasure taking care of you during this admission.  You will be transferred to [facility], where they will continue to wean you off of the ventilator.  You also have physical therapy to regain your strength.  You will be on several new medication for your blood pressure and for your heart.  You will need to follow-up with the heart failure team outpatient as well.  Take care,  Dr. Alfonse Spruce   Increase activity slowly   Complete by: As directed  Signed: Gaylan Gerold, DO 12/06/2022, 1:31 PM   Pager: [phone]

## 2022-12-06 NOTE — Progress Notes (Addendum)
RN called d/t pt desat in low 80's on ATC, pt appears anxious.  FIO2 on ATC was increased to 100%.  Sx for small amount watery clear secretions (noted watery appearance on chest and gown), RN and CCM aware.  Changed pox probe to verify correct sat/pleth.  Pt continues to desat 85-88% on 100% ATC, at this time pt was placed back on full vent support, fio2 on 60%.  Sat improved to 92% on vent.  CCM at bedside and aware, CXRay obtained.   Due to pt anxiety and being placed on vent w/ xray in room, unable to do CPT at this time

## 2022-12-06 NOTE — Progress Notes (Signed)
SLP Cancellation Note  Patient Details Name: Cynthia Underwood MRN: [id number] DOB: 1971-04-04   Cancelled treatment:        Attempted to see for PMV however pt needed to be placed back on vent support. Will continue efforts.   Houston Siren 12/06/2022, 11:12 AM

## 2022-12-06 NOTE — Progress Notes (Addendum)
Advanced Heart Failure Rounding Note  PCP-Cardiologist: Fransico Him, MD   Subjective:   Cath 3/14 c/w nonischemic cardiomyopathy, filling pressures minimally elevated on RHC. Excellent cardiac output by Fick. Extubated 3/15. Reintubated (3/16). Now s/p trach 3/25  No further VT/VF/torsades  Back on the vent this morning. Agitated. Denies drinking fluid. Refuses mouth care.   Objective:   Weight Range: 39.9 kg Body mass index is 15.58 kg/m.   Vital Signs:   Temp:  [97.5 F (36.4 C)-99.7 F (37.6 C)] 99.7 F (37.6 C) (04/03 0813) Pulse Rate:  [70-115] 105 (04/03 0700) Resp:  [14-27] 24 (04/03 0700) BP: (91-143)/(67-91) 119/79 (04/03 0700) SpO2:  [85 %-98 %] 90 % (04/03 0700) FiO2 (%):  [30 %-50 %] 50 % (04/03 0458) Weight:  [39.9 kg] 39.9 kg (04/03 0500) Last BM Date : 12/03/22  Weight change: Filed Weights   12/04/22 0500 12/05/22 0346 12/06/22 0500  Weight: 41 kg 39.5 kg 39.9 kg   Intake/Output:   Intake/Output Summary (Last 24 hours) at 12/06/2022 0914 Last data filed at 12/06/2022 0100 Gross per 24 hour  Intake 66.15 ml  Output 1150 ml  Net -1083.85 ml   Physical Exam  General:  Thin  HEENT: normal Neck: Trach. no JVD. Carotids 2+ bilat; no bruits. No lymphadenopathy or thryomegaly appreciated. Cor: PMI nondisplaced. Regular rate & rhythm. No rubs, gallops or murmurs. Lungs: Decreased in the bases.  Abdomen: soft, nontender, nondistended. No hepatosplenomegaly. No bruits or masses. Good bowel sounds. Extremities: no cyanosis, clubbing, rash, edema Neuro: alert . MAE x4. Follows commands.  Telemetry   ST. Intermittent pacing.  Labs    CBC Recent Labs    12/04/22 0247 12/05/22 0152 12/06/22 0051  WBC 9.1  9.1 11.1* 15.4*  NEUTROABS 5.3  --   --   HGB 11.4*  11.3* 11.1* 13.5  HCT 35.7*  35.6* 35.4* 43.6  MCV 98.1  97.5 98.6 100.2*  PLT 686*  705* 605* [id number]*   Basic Metabolic Panel Recent Labs    12/05/22 0152 12/06/22 0051  NA 142 145   K 4.6 3.3*  CL 103 101  CO2 25 27  GLUCOSE 144* 117*  BUN 43* 37*  CREATININE 0.76 0.85  CALCIUM 9.4 10.3   Liver Function Tests No results for input(s): "AST", "ALT", "ALKPHOS", "BILITOT", "PROT", "ALBUMIN" in the last 72 hours.  No results for input(s): "LIPASE", "AMYLASE" in the last 72 hours. Cardiac Enzymes No results for input(s): "CKTOTAL", "CKMB", "CKMBINDEX", "TROPONINI" in the last 72 hours.  BNP: BNP (last 3 results) Recent Labs    11/11/22 1238 11/17/22 2150  BNP 58.5 145.2*   ProBNP (last 3 results) No results for input(s): "PROBNP" in the last 8760 hours.  D-Dimer No results for input(s): "DDIMER" in the last 72 hours. Hemoglobin A1C No results for input(s): "HGBA1C" in the last 72 hours.  Fasting Lipid Panel No results for input(s): "CHOL", "HDL", "LDLCALC", "TRIG", "CHOLHDL", "LDLDIRECT" in the last 72 hours. Thyroid Function Tests No results for input(s): "TSH", "T4TOTAL", "T3FREE", "THYROIDAB" in the last 72 hours.  Invalid input(s): "FREET3"  Other results:  Imaging   DG CHEST PORT 1 VIEW  Result Date: 12/06/2022 CLINICAL DATA:  Shortness of breath EXAM: PORTABLE CHEST 1 VIEW COMPARISON:  Portable exam 0028 hours compared to 11/30/2022 FINDINGS: Tracheostomy tube projects over tracheal air column. LEFT subclavian sequential transvenous pacemaker leads project at RIGHT atrium and RIGHT ventricle. Normal heart size, mediastinal contours, and pulmonary vascularity. Atelectasis versus consolidation BILATERAL lower  lobes increased from previous exam. Upper lungs clear. No pleural effusion or pneumothorax. IMPRESSION: Increased atelectasis versus consolidation BILATERAL lower lobes. Electronically Signed   By: Lavonia Dana M.D.   On: 12/06/2022 08:35    Medications:     Scheduled Medications:  carbamide peroxide  5 drop Right EAR BID   Chlorhexidine Gluconate Cloth  6 each Topical Daily   clonazePAM  1 mg Per Tube Q8H   enoxaparin (LOVENOX) injection   30 mg Subcutaneous Q24H   feeding supplement (PROSource TF20)  60 mL Per Tube Daily   fiber supplement (BANATROL TF)  60 mL Per Tube TID   insulin aspart  0-9 Units Subcutaneous Q4H   levothyroxine  112 mcg Per Tube QAC breakfast   mouth rinse  15 mL Mouth Rinse Q2H   pantoprazole (PROTONIX) IV  40 mg Intravenous QHS   QUEtiapine  25 mg Per Tube QHS   scopolamine  1 patch Transdermal Q72H   sertraline  75 mg Per Tube Daily   sodium chloride flush  10-40 mL Intracatheter Q12H   sodium chloride flush  3 mL Intravenous Q12H   spironolactone  12.5 mg Per Tube Daily    Infusions:  sodium chloride Stopped (12/02/22 2226)   feeding supplement (OSMOLITE 1.5 CAL) Stopped (12/05/22 0701)    PRN Medications: sodium chloride, acetaminophen, diphenhydrAMINE, docusate, guaiFENesin-codeine, ipratropium-albuterol, lidocaine, lip balm, melatonin, mouth rinse, oxyCODONE, polyethylene glycol, sodium chloride flush, sodium chloride flush  Patient Profile   52 y.o. 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 Hospital course c/b VF arrest. Post arrest echo w/ severe BiV failure, LVEF < 20%, RV severely reduced + diffuse HK   Assessment/Plan  1. VF arrest: 3/12, shocked. 15 minutes CPR total.  - Due to long-short interval (R-on-T) in the setting of QT-prolonging drugs (azithro) and hypokalemia - Pacemaker lower rate limit increased to 90 to prevent pause-dependent TdP; now down to 70 - Per EP: may need primary prevention ICD if EF does not recover with GDMT. VT this admission was attributable to a reversible cause, so ICD not indicated urgently for secondary prevention. Can consider placement  of LifeVest on discharge but given stability of rhythm and need for chronic trach care would likely defer. - No arrhythmias  - Keep K> 4.0 Mg > 2.0 - avoid QT prolonging agents  2. Acute hypoxemic respiratory failure: Intubated post-VF arrest.  Had viral PNA with metapneumovirus at presentation.  - extubated 3/14 Reintubated 3/16 - Failed multiple SBTs - Trached and bronch 3/25- Back in vent this morning.  - CCM managing.   3. Acute systolic CHF: Prior workup for cardiac sarcoidosis. Cardiac MRI in 2017 showed subendocardial LGE in the mid inferior and inferolateral walls with EF 56%.  Cardiac PET done after this was NOT suggestive of cardiac sarcoidosis.  - Echo in 5/22 with normal EF.   - Echo this admission reviewed, EF 20-25% range with diffuse hypokinesis but relatively preserved function at the apex, moderate RV dysfunction.  - cath 3/15 no CAD - suspect due to myotonic dystrophy type 1 -> she has an aunt with LVAD who has cardiomyopathy due to myotonic dystrophy.  Father had a heart transplant, uncertain of underlying condition.  R/LHC c/w Nonischemic cardiomyopathy. No longer has PICC.  - Volume status stable. Start spiro 12.5 mg daily. Ordered 4/2 but  Cortrak clogged. Replacing today.   - Eventual SGLT2i, foley now removed with purewick. Needs to be more mobile. - GDMT limited by hypotension.  - Given overall debility, she is not currently a candidate for advanced therapies - Would consider palliative care input    4. High grade heart block: Has MDT PPM with His bundle lead.  This is likely due to cardiac involvement of myotonic dystrophy type 1.  - plan as above  5. Myotonic dystrophy: Type 1, diagnosed at age 41. Has family members with the same diagnosis.  Followed by neurology.   6. Tobacco abuse - needs cessation  7. Hypokalemia/hypomag - K 3.3  - Supp K  - follow BMP   8. Anemia of chronic illness - s/p 1uPRBC this admit - hgb stable today at 11.1   Length of  Stay: [place], NP  12/06/2022, 9:14 AM  Advanced Heart Failure Team Pager [phone] (M-F; 7a - 5p)  Please contact [facility] for night-coverage after hours (5p -7a ) and weekends on [email or web address]  Agree with above NP note.   Still requiring vent, tires on trach collar.   General: NAD, frail. Neck: tracheostomy. No JVD, no thyromegaly or thyroid nodule.  Lungs: Clear to auscultation bilaterally with normal respiratory effort. CV: Nondisplaced PMI.  Heart regular S1/S2, no S3/S4, no murmur.  No peripheral edema.   Abdomen: Soft, nontender, no hepatosplenomegaly, no distention.  Skin: Intact without lesions or rashes.  Neurologic: Alert and oriented x 3.  Psych: Normal affect. Extremities: No clubbing or cyanosis.  HEENT: Normal.   She continues to require vent at times as she tires on trach collar.  Suspect muscle weakness from myotonic dystrophy plays a large role.  May need LTAC.    Nonischemic cardiomyopathy, likely from myotonic dystrophy.  Limited in terms of GDMT by soft BP.  Volume status looks ok.  - Restart spironolactone 12.5 daily today (did not get yesterday).  - Can start losartan low dose tomorrow if does ok with spironolactone.   Loralie Champagne 12/06/2022 10:08 AM

## 2022-12-06 NOTE — TOC Transition Note (Signed)
Transition of Care [facility]) - CM/SW Discharge Note   Patient Details  Name: Cynthia Underwood MRN: [id number] Date of Birth: February 27, 1971  Transition of Care [facility]) CM/SW Contact:  Cyndi Bender, RN Phone Number: 12/06/2022, 1:46 PM   Clinical Narrative:    Patient will transfer to [facility] room [phone].     Barriers to Discharge: Continued Medical Work up   Patient Goals and CMS Choice      Discharge Placement                         Discharge Plan and Services Additional resources added to the After Visit Summary for     Discharge Planning Services: CM Consult Post Acute Care Choice: Long Term Acute Care (LTAC)                               Social Determinants of Health (SDOH) Interventions SDOH Screenings   Food Insecurity: No Food Insecurity (10/16/2022)  Housing: Low Risk  (10/16/2022)  Transportation Needs: No Transportation Needs (10/16/2022)  Utilities: Not At Risk (10/16/2022)  Alcohol Screen: Low Risk  (10/16/2022)  Depression (PHQ2-9): Medium Risk (11/08/2022)  Financial Resource Strain: Low Risk  (10/16/2022)  Physical Activity: Inactive (10/16/2022)  Social Connections: Socially Isolated (10/16/2022)  Stress: Stress Concern Present (10/16/2022)  Tobacco Use: High Risk (11/17/2022)     Readmission Risk Interventions     No data to display

## 2022-12-06 NOTE — Progress Notes (Signed)
NAME:  Cynthia Underwood, MRN:  [id number], DOB:  Apr 05, 1971, LOS: 53 ADMISSION DATE:  11/11/2022 CONSULTATION DATE:  11/14/2022 REFERRING MD:  Wyline Copas - TRH CHIEF COMPLAINT:  Vfib arrest on floor   History of Present Illness:   52 year old woman who initially presented to [facility] 3/9 for cough and progressive dyspnea x 4 days. Also reported diarrhea and 1 episode of epigastric pain with vomiting. PMHx significant for RBBB/high-grade heart block (s/p PPM), hypothyroidism, sickle cell trait, myotonic dystrophy (dx age 69), tobacco use.  Noted to be hypoxic in ED with O2 sat 88% on RA, CTA Chest c/f LLL PNA with mild bronchiectasis, negative for PE. Also c/f esophagitis. Patient was empirically started on CAP coverage (ceftriaxone, azithromycin) and GERD treatment (PPI/sucralfate). Also found to be positive for human metapneumovirus. Admitted to [facility].  Patient was initially progressing as expected 3/11-3/12 until ~1400, sat was noted to be in 80s. Placed on 5L humidified oxygen with slight improvement in sats. Solumedrol and DuoNebs were ordered. Around 1900 patient's RN was informed by telemetry of VF and was found unresponsive and pulseless. Code Blue was called with CPR x 15 minutes and ROSC. Amiodarone, Epi and defib were administered. Patient was intubated by anesthesia peri-code. IO was placed and Levophed was initiated. Cardiology was consulted.  PCCM was consulted for ICU admission post-arrest.  Pertinent Medical History:  Heart block, Goiter, Sickle cell trait, Myotonic dystrophy  Significant Hospital Events: Including procedures, antibiotic start and stop dates in addition to other pertinent events   3/9 - Presented to [facility] for LLL PNA. Started on Surveyor, quantity. +human metapneumovirus. 3/12 - Hypoxic requiring 5LNC. Later VF arrest on floor, CPR 3/13 echocardiogram LVEF severely depressed 50% (decreased compared with 01/2021), severely reduced RV function 3/14 Cardiac catheterization showed  normal coronaries, new normal filling pressures and normal PA pressure.  With excellent cardiac output 3/15 Extubated.  3/16 Reintubated for increased work of breathing, failed BiPAP 3/18 alert, interactive off vasopressors 3/20 Wean, trial extubation today  3/21 No acute issues overnight, failed early am wean due to apnea on 50 Fentanyl and 0.8 Pecedex  3/24 PRBC transfusion 3/25 Percutaneous tracheostomy 3/29 ATC trials during the day  Interim History / Subjective:   Patient was able to tolerate trach collar all at night.  She was anxious and agitated last night but did not have to be back on a ventilator.  This morning she was calm.   Objective:  Blood pressure 119/79, pulse (!) 105, temperature 98.4 F (36.9 C), temperature source Oral, resp. rate (!) 24, height 5\' 3"  (1.6 m), weight 39.9 kg, last menstrual period 12/03/2016, SpO2 90 %.    Vent Mode: PRVC FiO2 (%):  [30 %-60 %] 50 % Set Rate:  [24 bmp] 24 bmp Vt Set:  [410 mL] 410 mL PEEP:  [5 cmH20] 5 cmH20 Plateau Pressure:  [18 cmH20] 18 cmH20   Intake/Output Summary (Last 24 hours) at 12/06/2022 0739 Last data filed at 12/06/2022 0100 Gross per 24 hour  Intake 78.38 ml  Output 1150 ml  Net -1071.62 ml    Filed Weights   12/04/22 0500 12/05/22 0346 12/06/22 0500  Weight: 41 kg 39.5 kg 39.9 kg   Physical Exam: Physical Exam Constitutional:      General: She is not in acute distress.    Comments: Awake, alert and oriented  HENT:     Head: Normocephalic.  Eyes:     General:        Right eye: No discharge.  Left eye: No discharge.  Neck:     Comments: Trach collar in place.  She has copious secretion Cardiovascular:     Rate and Rhythm: Regular rhythm. Tachycardia present.  Pulmonary:     Effort: No respiratory distress.     Comments: She is on trach collar this morning Abdominal:     General: Bowel sounds are normal. There is no distension.     Palpations: Abdomen is soft.     Tenderness: There is no  abdominal tenderness.  Musculoskeletal:        General: Normal range of motion.     Right lower leg: No edema.     Left lower leg: No edema.  Skin:    General: Skin is warm.  Neurological:     Mental Status: Mental status is at baseline.       Resolved Hospital Problem List:  Septic shock from pneumonia, Cardiogenic shock from cardiac arrest, Shock liver in setting of cardiac arrest  Assessment & Plan:  Post-Vfib arrest Heart block s/p MDT PPM. New acute non ischemic systolic CHF with EF 123456. Heart failure team is following.  -Continue spironolactone 12.5 mg -Will redose IV Lasix if there is evidence of pulmonary edema -Hypotension is a barrier for adding GDMT  Acute hypoxic respiratory failure from cardiac arrest, CHF, Metapneumovirus pneumonia, HCAP, muscle weakness from myotonic dystrophy. S/p tracheostomy on 3/25 Hospital-acquired pneumonia SLP consult for PMV Trach collar as tolerated. S/p Cefepime.  BAL cultures from 3/25 shows normal flora -Repeat chest x-ray today to look for pulmonary edema.  This may explain her cough and sputum production   Acute metabolic encephalopathy from anoxia, sepsis. Off Precedex -Seroquel 25 mg QHS  -Klonopin 1 mg TID 4/1 -Zoloft 75 mg daily 4/1   Thrombocytosis Likely reactive in the setting of IDA and infection.  No worrisome finding on blood smear.  Hyperglycemia. SSI   GERD with esophagitis. Continue PPI   Hypothyroidism. Continue levothyroxine  Anemia of iron deficiency, critical illness and chronic disease. f/u CBC Transfuse for Hb < 7 Given Ferrlecit on Sunday 3/24 for iron supplementation  Severe malnutrition, present on admission Continue tube feeds.  Placing new core track today   Disposition Patient is approved for LTAC.  Pending [facility] approval.  Best Practice: (right click and "Reselect all SmartList Selections" daily)   Diet/type: tubefeeds DVT prophylaxis: Lovenox GI prophylaxis:  PPI Lines:NA Foley:  NA Code Status: Full Last date of multidisciplinary goals of care discussion [updated sister via phone 4/2]  Signature:   Gaylan Gerold, DO Internal Medicine Residency My pager: [phone]

## 2022-12-06 NOTE — Progress Notes (Signed)
Came to room to assess pt, noted pt vomiting green/yellow bile colored- RN was at bedside and pt was oral sx and also suctioned trach.  Sx for clear secretions, No noted green/yellow bile when sx trach.  RN aware.

## 2022-12-07 ENCOUNTER — Other Ambulatory Visit (HOSPITAL_COMMUNITY): Payer: 59

## 2022-12-07 DIAGNOSIS — J189 Pneumonia, unspecified organism: Secondary | ICD-10-CM

## 2022-12-07 DIAGNOSIS — I5043 Acute on chronic combined systolic (congestive) and diastolic (congestive) heart failure: Secondary | ICD-10-CM

## 2022-12-07 DIAGNOSIS — Z930 Tracheostomy status: Secondary | ICD-10-CM

## 2022-12-07 DIAGNOSIS — Z4659 Encounter for fitting and adjustment of other gastrointestinal appliance and device: Secondary | ICD-10-CM | POA: Diagnosis not present

## 2022-12-07 DIAGNOSIS — G7111 Myotonic muscular dystrophy: Secondary | ICD-10-CM

## 2022-12-07 DIAGNOSIS — J9621 Acute and chronic respiratory failure with hypoxia: Secondary | ICD-10-CM

## 2022-12-07 MED ORDER — DIATRIZOATE MEGLUMINE & SODIUM 66-10 % PO SOLN
ORAL | Status: AC
  Filled 2022-12-07: qty 30

## 2022-12-07 NOTE — Consult Note (Signed)
[facility]  PULMONARY SERVICE  Date of Service: 12/07/2022  PULMONARY CRITICAL CARE CONSULT   Cynthia Underwood  [id number]  DOB: [birth year]   DOA: 12/06/2022  Referring Physician: Satira Sark, MD  HPI: Cynthia Underwood is a 52 y.o. female seen for follow up of Acute on Chronic Respiratory Failure.  Patient has multiple medical problems including pneumonia bundle branch block biventricular heart failure who came into the hospital with increasing shortness of breath.  Patient was found to have healthcare associated pneumonia has been treated with antibiotics.  She subsequently was intubated placed on the mechanical ventilation however then was not able to come off the ventilator and therefore had to have a tracheostomy.  Patient also has a history of CHF with an ejection fraction of 20% patient has been standard treatment for heart failure appears to be doing a little bit better.  She is transferred to our facility on T-bar and for further management.  Review of Systems:  ROS performed and is unremarkable other than noted above.  Past Medical History:  Diagnosis Date   Bifascicular block 10/02/2014   Cataract    Dyspnea    Excess ear wax    Multinodular goiter    Pneumonia    Presence of permanent cardiac pacemaker    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   Sickle cell trait ([place])    Watery eyes    Left   Weakness of both legs     Past Surgical History:  Procedure Laterality Date   BREAST BIOPSY Right    EYE SURGERY Left 2017   "related to blockage in my nose"   EYE SURGERY Right 05/2019   INSERT / REPLACE / REMOVE PACEMAKER  02/15/2017   PACEMAKER IMPLANT N/A 02/15/2017   Procedure: Pacemaker Implant;  Surgeon: Deboraha Sprang, MD;  Location: [place] [facility];  Service: Cardiovascular;  Laterality: N/A;   RIGHT/LEFT HEART CATH AND CORONARY ANGIOGRAPHY N/A 11/16/2022    Procedure: RIGHT/LEFT HEART CATH AND CORONARY ANGIOGRAPHY;  Surgeon: Larey Dresser, MD;  Location: [place] [facility];  Service: Cardiovascular;  Laterality: N/A;   THYROIDECTOMY N/A 04/21/2021   Procedure: TOTAL THYROIDECTOMY;  Surgeon: Ralene Ok, MD;  Location: [place];  Service: General;  Laterality: N/A;    Social History:    reports that she has been smoking cigarettes. She has never used smokeless tobacco. She reports that she does not currently use alcohol. She reports that she does not currently use drugs after having used the following drugs: Marijuana.  Family History: Non-Contributory to the present illness  Allergies  Allergen Reactions   Penicillin G Other (See Comments), Rash and Nausea And Vomiting    Sore throat   Penicillins Nausea And Vomiting, Rash and Other (See Comments)    Fever Has patient had a PCN reaction causing immediate rash, facial/tongue/throat swelling, SOB or lightheadedness with hypotension: Yes Has patient had a PCN reaction causing severe rash involving mucus membranes or skin necrosis: Unknown Has patient had a PCN reaction that required hospitalization: No Has patient had a PCN reaction occurring within the last 10 years: No If all of the above answers are "NO", then may proceed with Cephalosporin use.     Medications: Reviewed on Rounds  Physical Exam:  Vitals: Temperature is 98.8 pulse of 108 respiratory rate is 20 blood pressure 127/84 saturation is 98%  Ventilator Settings patient was currently on the ventilator and full  support  General: Comfortable at this time Eyes: Grossly normal lids, irises & conjunctiva ENT: grossly tongue is normal Neck: no obvious mass Cardiovascular: S1-S2 normal no gallop or rub Respiratory: No rhonchi no rales are noted at this time Abdomen: Soft nontender Skin: no rash seen on limited exam Musculoskeletal: not rigid Psychiatric:unable to assess Neurologic: no seizure no involuntary movements          Labs on Admission:  Basic Metabolic Panel: Recent Labs  Lab 12/02/22 0104 12/03/22 1234 12/04/22 0247 12/05/22 0152 12/06/22 0051  NA 137 136 139 142 145  K 3.5 5.1 5.2* 4.6 3.3*  CL 98 101 97* 103 101  CO2 23 25 27 25 27   GLUCOSE 117* 120* 131* 144* 117*  BUN 22* 48* 48* 43* 37*  CREATININE 0.74 0.95 0.85 0.76 0.85  CALCIUM 9.8 9.2 9.5 9.4 10.3    Recent Labs  Lab 12/06/22 1714  PHART 7.54*  PCO2ART 30*  PO2ART 72*  HCO3 25.7  O2SAT 96.4    Liver Function Tests: No results for input(s): "AST", "ALT", "ALKPHOS", "BILITOT", "PROT", "ALBUMIN" in the last 168 hours. No results for input(s): "LIPASE", "AMYLASE" in the last 168 hours. No results for input(s): "AMMONIA" in the last 168 hours.  CBC: Recent Labs  Lab 12/01/22 1105 12/02/22 0104 12/04/22 0247 12/05/22 0152 12/06/22 0051  WBC 10.4 14.9* 9.1  9.1 11.1* 15.4*  NEUTROABS  --   --  5.3  --   --   HGB 10.0* 11.1* 11.4*  11.3* 11.1* 13.5  HCT 30.7* 33.7* 35.7*  35.6* 35.4* 43.6  MCV 97.5 98.5 98.1  97.5 98.6 100.2*  PLT 583* 709* 686*  705* 605* 849*    Cardiac Enzymes: No results for input(s): "CKTOTAL", "CKMB", "CKMBINDEX", "TROPONINI" in the last 168 hours.  BNP (last 3 results) Recent Labs    11/11/22 1238 11/17/22 2150  BNP 58.5 145.2*    ProBNP (last 3 results) No results for input(s): "PROBNP" in the last 8760 hours.   Radiological Exams on Admission: DG Abd 1 View  Result Date: 12/07/2022 CLINICAL DATA:  Feeding tube placement EXAM: ABDOMEN - 1 VIEW COMPARISON:  12/06/2022 FINDINGS: A feeding tube extends down the esophagus and terminates in the stomach body. Stable Cardiac pacer leads noted. The lung bases appear clear. Unremarkable bowel gas pattern. Spina bifida occulta and right pars defect at L5. IMPRESSION: 1. Feeding tube tip is in the stomach body. 2. Spina bifida occulta and chronic right pars defect at L5. Electronically Signed   By: Van Clines M.D.   On:  12/07/2022 12:19   DG Abd Portable 1V  Result Date: 12/06/2022 CLINICAL DATA:  Pneumonia. Respiratory failure. Nasogastric tube placement. EXAM: PORTABLE ABDOMEN - 1 VIEW COMPARISON:  Radiographs 12/06/2022 and 11/15/2022.  CT 02/20/2018. FINDINGS: 1728 hours. Feeding tube tip projects over the L2 vertebral body, likely in the distal stomach. The visualized bowel gas pattern appears nonobstructive. No acute findings are identified. IMPRESSION: Feeding tube tip likely in the distal stomach. Electronically Signed   By: Richardean Sale M.D.   On: 12/06/2022 17:43   DG CHEST PORT 1 VIEW  Result Date: 12/06/2022 CLINICAL DATA:  Pneumonia. Respiratory failure. Nasogastric tube placement. EXAM: PORTABLE CHEST 1 VIEW COMPARISON:  Radiographs earlier the same date and 11/30/2022. CT 11/11/2022. FINDINGS: 1727 hours. Tracheostomy and left subclavian pacemaker leads appear unchanged. Interval placement of a feeding tube which projects below the diaphragm, tip overlying the L2 vertebral body, likely in the distal stomach. The  heart size and mediastinal contours are stable. There is interval improved aeration of both lung bases. No pleural effusion or pneumothorax. Stable postsurgical changes from previous thyroidectomy. IMPRESSION: Interval feeding tube placement with tip likely in the distal stomach. Improved aeration of the lung bases. Electronically Signed   By: Richardean Sale M.D.   On: 12/06/2022 17:42   DG Abd Portable 1V  Result Date: 12/06/2022 CLINICAL DATA:  Enteric tube placement. EXAM: PORTABLE ABDOMEN - 1 VIEW COMPARISON:  Abdominal x-ray dated December 02, 2022. FINDINGS: The feeding tube is been advanced since the prior study, with tip now in the gastric antrum. IMPRESSION: 1. Feeding tube tip in the gastric antrum. Electronically Signed   By: Titus Dubin M.D.   On: 12/06/2022 11:37   DG CHEST PORT 1 VIEW  Result Date: 12/06/2022 CLINICAL DATA:  Shortness of breath EXAM: PORTABLE CHEST 1 VIEW  COMPARISON:  Portable exam 0028 hours compared to 11/30/2022 FINDINGS: Tracheostomy tube projects over tracheal air column. LEFT subclavian sequential transvenous pacemaker leads project at RIGHT atrium and RIGHT ventricle. Normal heart size, mediastinal contours, and pulmonary vascularity. Atelectasis versus consolidation BILATERAL lower lobes increased from previous exam. Upper lungs clear. No pleural effusion or pneumothorax. IMPRESSION: Increased atelectasis versus consolidation BILATERAL lower lobes. Electronically Signed   By: Lavonia Dana M.D.   On: 12/06/2022 08:35    Assessment/Plan Active Problems:   Myotonic dystrophy   Healthcare-associated pneumonia   Acute on chronic combined systolic and diastolic CHF (congestive heart failure)   Acute on chronic respiratory failure with hypoxia   Tracheostomy status   Acute on chronic respiratory failure hypoxia patient's RSBI and mechanics will be assessed and will start the patient on weaning protocol.  As patient progresses we will wean down on the oxygen. Chronic heart failure with reduced ejection fraction patient's last ejection fraction was 20% we will continue to monitor fluid status diuresis as tolerated follow labs as closely as possible. Healthcare associated pneumonia patient has been treated with antibiotics at the transferring facility.  Latest chest x-ray shows some atelectasis versus consolidation in the lower lobes Tracheostomy status will continue with routine tracheal care Myotonic dystrophy supportive care and physical therapy to evaluate  I have personally seen and evaluated the patient, evaluated laboratory and imaging results, formulated the assessment and plan and placed orders. The Patient requires high complexity decision making with multiple systems involvement.  Case was discussed on Rounds with the Respiratory Therapy Director and the Respiratory staff Time Spent 61minutes  Kajuana Shareef A Runette Scifres, MD [facility] Pulmonary Critical  Care Medicine Sleep Medicine

## 2022-12-08 ENCOUNTER — Other Ambulatory Visit (HOSPITAL_COMMUNITY): Payer: 59

## 2022-12-08 LAB — CBC
HCT: 42.1 % (ref 36.0–46.0)
Hemoglobin: 13.5 g/dL (ref 12.0–15.0)
MCH: 31.8 pg (ref 26.0–34.0)
MCHC: 32.1 g/dL (ref 30.0–36.0)
MCV: 99.3 fL (ref 80.0–100.0)
Platelets: 619 10*3/uL — ABNORMAL HIGH (ref 150–400)
RBC: 4.24 MIL/uL (ref 3.87–5.11)
RDW: 15.9 % — ABNORMAL HIGH (ref 11.5–15.5)
WBC: 17.5 10*3/uL — ABNORMAL HIGH (ref 4.0–10.5)
nRBC: 0 % (ref 0.0–0.2)

## 2022-12-08 LAB — BASIC METABOLIC PANEL
Anion gap: 18 — ABNORMAL HIGH (ref 5–15)
BUN: 32 mg/dL — ABNORMAL HIGH (ref 6–20)
CO2: 25 mmol/L (ref 22–32)
Calcium: 9.7 mg/dL (ref 8.9–10.3)
Chloride: 104 mmol/L (ref 98–111)
Creatinine, Ser: 0.88 mg/dL (ref 0.44–1.00)
GFR, Estimated: >60 mL/min (ref >60)
Glucose, Bld: 147 mg/dL — ABNORMAL HIGH (ref 70–99)
Potassium: 4.4 mmol/L (ref 3.5–5.1)
Sodium: 147 mmol/L — ABNORMAL HIGH (ref 135–145)

## 2022-12-08 LAB — MAGNESIUM: Magnesium: 2.5 mg/dL — ABNORMAL HIGH (ref 1.7–2.4)

## 2022-12-08 NOTE — Progress Notes (Addendum)
Pulmonary Critical Care Medicine [facility]   PULMONARY CRITICAL CARE SERVICE  PROGRESS NOTE     EZRAH Underwood  [id number]  DOB: 1970/10/17   DOA: 12/06/2022  Referring Physician: Luna Kitchens, MD  HPI: Cynthia Underwood is a 52 y.o. female being followed for ventilator/airway/oxygen weaning Acute on Chronic Respiratory Failure.  Patient seen lying in bed, currently on pressure support 12/5 FiO2 28% working towards 4-hour pressure support goal.  Successfully completed 2-hour pressure support goal yesterday.  Medications: Reviewed on Rounds  Physical Exam:  Vitals: Temp 100.5, pulse 118, respirations 20, BP 133/88, SpO2 95%  Ventilator Settings pressure support 12/5 FiO2 28%  General: Comfortable at this time Neck: supple Cardiovascular: no malignant arrhythmias Respiratory: Bilaterally diminished Skin: no rash seen on limited exam Musculoskeletal: No gross abnormality Psychiatric:unable to assess Neurologic:no involuntary movements         Lab Data:   Basic Metabolic Panel: Recent Labs  Lab 12/03/22 1234 12/04/22 0247 12/05/22 0152 12/06/22 0051 12/08/22 1111  NA 136 139 142 145 147*  K 5.1 5.2* 4.6 3.3* 4.4  CL 101 97* 103 101 104  CO2 25 27 25 27 25   GLUCOSE 120* 131* 144* 117* 147*  BUN 48* 48* 43* 37* 32*  CREATININE 0.95 0.85 0.76 0.85 0.88  CALCIUM 9.2 9.5 9.4 10.3 9.7  MG  --   --   --   --  2.5*    ABG: Recent Labs  Lab 12/06/22 1714  PHART 7.54*  PCO2ART 30*  PO2ART 72*  HCO3 25.7  O2SAT 96.4    Liver Function Tests: No results for input(s): "AST", "ALT", "ALKPHOS", "BILITOT", "PROT", "ALBUMIN" in the last 168 hours. No results for input(s): "LIPASE", "AMYLASE" in the last 168 hours. No results for input(s): "AMMONIA" in the last 168 hours.  CBC: Recent Labs  Lab 12/02/22 0104 12/04/22 0247 12/05/22 0152 12/06/22 0051 12/08/22 1111  WBC 14.9* 9.1  9.1 11.1* 15.4* 17.5*  NEUTROABS  --  5.3  --    --   --   HGB 11.1* 11.4*  11.3* 11.1* 13.5 13.5  HCT 33.7* 35.7*  35.6* 35.4* 43.6 42.1  MCV 98.5 98.1  97.5 98.6 100.2* 99.3  PLT 709* 686*  705* 605* 849* 619*    Cardiac Enzymes: No results for input(s): "CKTOTAL", "CKMB", "CKMBINDEX", "TROPONINI" in the last 168 hours.  BNP (last 3 results) Recent Labs    11/11/22 1238 11/17/22 2150  BNP 58.5 145.2*    ProBNP (last 3 results) No results for input(s): "PROBNP" in the last 8760 hours.  Radiological Exams: DG CHEST PORT 1 VIEW  Result Date: 12/08/2022 CLINICAL DATA:  Nasogastric tube placement.  Concern for aspiration. EXAM: PORTABLE CHEST 1 VIEW COMPARISON:  Radiographs 12/06/2022 and CT 11/11/2022. FINDINGS: 1125 hours. Feeding tube tip projects over the L2-3 disc space, consistent with position in the distal stomach. Tracheostomy and left subclavian pacemaker leads appear unchanged. The heart size and mediastinal contours are stable. Patchy bibasilar pulmonary opacities have mildly worsened on the left in the interval. No pneumothorax or significant pleural effusion. Surgical clips from previous thyroidectomy. Numerous telemetry leads overlie the chest. IMPRESSION: Feeding tube tip projects over the distal stomach. Increased patchy left basilar pulmonary opacity which may reflect atelectasis or aspiration. Electronically Signed   By: Carey Bullocks M.D.   On: 12/08/2022 11:43   DG Abd 1 View  Result Date: 12/07/2022 CLINICAL DATA:  Feeding tube placement EXAM: ABDOMEN - 1 VIEW COMPARISON:  12/06/2022 FINDINGS: A feeding tube extends down the esophagus and terminates in the stomach body. Stable Cardiac pacer leads noted. The lung bases appear clear. Unremarkable bowel gas pattern. Spina bifida occulta and right pars defect at L5. IMPRESSION: 1. Feeding tube tip is in the stomach body. 2. Spina bifida occulta and chronic right pars defect at L5. Electronically Signed   By: Gaylyn Rong M.D.   On: 12/07/2022 12:19   DG Abd  Portable 1V  Result Date: 12/06/2022 CLINICAL DATA:  Pneumonia. Respiratory failure. Nasogastric tube placement. EXAM: PORTABLE ABDOMEN - 1 VIEW COMPARISON:  Radiographs 12/06/2022 and 11/15/2022.  CT 02/20/2018. FINDINGS: 1728 hours. Feeding tube tip projects over the L2 vertebral body, likely in the distal stomach. The visualized bowel gas pattern appears nonobstructive. No acute findings are identified. IMPRESSION: Feeding tube tip likely in the distal stomach. Electronically Signed   By: Carey Bullocks M.D.   On: 12/06/2022 17:43   DG CHEST PORT 1 VIEW  Result Date: 12/06/2022 CLINICAL DATA:  Pneumonia. Respiratory failure. Nasogastric tube placement. EXAM: PORTABLE CHEST 1 VIEW COMPARISON:  Radiographs earlier the same date and 11/30/2022. CT 11/11/2022. FINDINGS: 1727 hours. Tracheostomy and left subclavian pacemaker leads appear unchanged. Interval placement of a feeding tube which projects below the diaphragm, tip overlying the L2 vertebral body, likely in the distal stomach. The heart size and mediastinal contours are stable. There is interval improved aeration of both lung bases. No pleural effusion or pneumothorax. Stable postsurgical changes from previous thyroidectomy. IMPRESSION: Interval feeding tube placement with tip likely in the distal stomach. Improved aeration of the lung bases. Electronically Signed   By: Carey Bullocks M.D.   On: 12/06/2022 17:42    Assessment/Plan Active Problems:   Myotonic dystrophy   Healthcare-associated pneumonia   Acute on chronic combined systolic and diastolic CHF (congestive heart failure)   Acute on chronic respiratory failure with hypoxia   Tracheostomy status   Acute on chronic respiratory failure hypoxia-patient successfully completed 2-hour pressure support goal yesterday, currently working towards 4-hour pressure support goal today. Chronic heart failure with reduced ejection fraction patient's last ejection fraction was 20% we will continue  to monitor fluid status diuresis as tolerated follow labs as closely as possible. Healthcare associated pneumonia patient has been treated with antibiotics at the transferring facility.  Latest chest x-ray shows some atelectasis versus consolidation in the lower lobes Tracheostomy status will continue with routine tracheal care Myotonic dystrophy supportive care and physical therapy to evaluate   I have personally seen and evaluated the patient, evaluated laboratory and imaging results, formulated the assessment and plan and placed orders. The Patient requires high complexity decision making with multiple systems involvement.  Rounds were done with the Respiratory Therapy Director and Staff therapists and discussed with nursing staff also.  Yevonne Pax, MD [facility] Pulmonary Critical Care Medicine Sleep Medicine

## 2022-12-09 LAB — BASIC METABOLIC PANEL
Anion gap: 14 (ref 5–15)
BUN: 24 mg/dL — ABNORMAL HIGH (ref 6–20)
CO2: 25 mmol/L (ref 22–32)
Calcium: 9.3 mg/dL (ref 8.9–10.3)
Chloride: 107 mmol/L (ref 98–111)
Creatinine, Ser: 0.74 mg/dL (ref 0.44–1.00)
GFR, Estimated: >60 mL/min (ref >60)
Glucose, Bld: 135 mg/dL — ABNORMAL HIGH (ref 70–99)
Potassium: 3.5 mmol/L (ref 3.5–5.1)
Sodium: 146 mmol/L — ABNORMAL HIGH (ref 135–145)

## 2022-12-09 LAB — CBC WITH DIFFERENTIAL/PLATELET
Abs Immature Granulocytes: 0.13 10*3/uL — ABNORMAL HIGH (ref 0.00–0.07)
Basophils Absolute: 0.1 10*3/uL (ref 0.0–0.1)
Basophils Relative: 1 %
Eosinophils Absolute: 0.1 10*3/uL (ref 0.0–0.5)
Eosinophils Relative: 1 %
HCT: 40.9 % (ref 36.0–46.0)
Hemoglobin: 13 g/dL (ref 12.0–15.0)
Immature Granulocytes: 1 %
Lymphocytes Relative: 19 %
Lymphs Abs: 2.9 10*3/uL (ref 0.7–4.0)
MCH: 31.6 pg (ref 26.0–34.0)
MCHC: 31.8 g/dL (ref 30.0–36.0)
MCV: 99.3 fL (ref 80.0–100.0)
Monocytes Absolute: 1.3 10*3/uL — ABNORMAL HIGH (ref 0.1–1.0)
Monocytes Relative: 9 %
Neutro Abs: 10.9 10*3/uL — ABNORMAL HIGH (ref 1.7–7.7)
Neutrophils Relative %: 69 %
Platelets: 524 10*3/uL — ABNORMAL HIGH (ref 150–400)
RBC: 4.12 MIL/uL (ref 3.87–5.11)
RDW: 15.7 % — ABNORMAL HIGH (ref 11.5–15.5)
WBC: 15.4 10*3/uL — ABNORMAL HIGH (ref 4.0–10.5)
nRBC: 0 % (ref 0.0–0.2)

## 2022-12-09 NOTE — Progress Notes (Cosign Needed)
Pulmonary Critical Care Medicine [facility] [place]   PULMONARY CRITICAL CARE SERVICE  PROGRESS NOTE     Cynthia Underwood  ZSW:[id number]  DOB: 1971/08/02   DOA: 12/06/2022  Referring Physician: Luna Kitchens, MD  HPI: Cynthia Underwood is a 52 y.o. female being followed for ventilator/airway/oxygen weaning Acute on Chronic Respiratory Failure.  Patient seen lying in bed, currently on pressure support 12/5 FiO2 28%.  Unfortunately failed 4-hour attempt pressure support yesterday due to low tidal volumes, currently working towards 4-hour pressure support attempt again today.  Medications: Reviewed on Rounds  Physical Exam:  Vitals: Temp 98.5, pulse 104, respirations 25, BP 121/82, SpO2 93%  Ventilator Settings pressure support 12/5 FiO2 28%  General: Comfortable at this time Neck: supple Cardiovascular: no malignant arrhythmias Respiratory: Bilaterally diminished Skin: no rash seen on limited exam Musculoskeletal: No gross abnormality Psychiatric:unable to assess Neurologic:no involuntary movements         Lab Data:   Basic Metabolic Panel: Recent Labs  Lab 12/04/22 0247 12/05/22 0152 12/06/22 0051 12/08/22 1111 12/09/22 0120  NA 139 142 145 147* 146*  K 5.2* 4.6 3.3* 4.4 3.5  CL 97* 103 101 104 107  CO2 27 25 27 25 25   GLUCOSE 131* 144* 117* 147* 135*  BUN 48* 43* 37* 32* 24*  CREATININE 0.85 0.76 0.85 0.88 0.74  CALCIUM 9.5 9.4 10.3 9.7 9.3  MG  --   --   --  2.5*  --     ABG: Recent Labs  Lab 12/06/22 1714  PHART 7.54*  PCO2ART 30*  PO2ART 72*  HCO3 25.7  O2SAT 96.4    Liver Function Tests: No results for input(s): "AST", "ALT", "ALKPHOS", "BILITOT", "PROT", "ALBUMIN" in the last 168 hours. No results for input(s): "LIPASE", "AMYLASE" in the last 168 hours. No results for input(s): "AMMONIA" in the last 168 hours.  CBC: Recent Labs  Lab 12/04/22 0247 12/05/22 0152 12/06/22 0051 12/08/22 1111 12/09/22 0120  WBC 9.1  9.1  11.1* 15.4* 17.5* 15.4*  NEUTROABS 5.3  --   --   --  10.9*  HGB 11.4*  11.3* 11.1* 13.5 13.5 13.0  HCT 35.7*  35.6* 35.4* 43.6 42.1 40.9  MCV 98.1  97.5 98.6 100.2* 99.3 99.3  PLT 686*  705* 605* 849* 619* 524*    Cardiac Enzymes: No results for input(s): "CKTOTAL", "CKMB", "CKMBINDEX", "TROPONINI" in the last 168 hours.  BNP (last 3 results) Recent Labs    11/11/22 1238 11/17/22 2150  BNP 58.5 145.2*    ProBNP (last 3 results) No results for input(s): "PROBNP" in the last 8760 hours.  Radiological Exams: DG CHEST PORT 1 VIEW  Result Date: 12/08/2022 CLINICAL DATA:  Nasogastric tube placement.  Concern for aspiration. EXAM: PORTABLE CHEST 1 VIEW COMPARISON:  Radiographs 12/06/2022 and CT 11/11/2022. FINDINGS: 1125 hours. Feeding tube tip projects over the L2-3 disc space, consistent with position in the distal stomach. Tracheostomy and left subclavian pacemaker leads appear unchanged. The heart size and mediastinal contours are stable. Patchy bibasilar pulmonary opacities have mildly worsened on the left in the interval. No pneumothorax or significant pleural effusion. Surgical clips from previous thyroidectomy. Numerous telemetry leads overlie the chest. IMPRESSION: Feeding tube tip projects over the distal stomach. Increased patchy left basilar pulmonary opacity which may reflect atelectasis or aspiration. Electronically Signed   By: Carey Bullocks M.D.   On: 12/08/2022 11:43    Assessment/Plan Active Problems:   Myotonic dystrophy   Healthcare-associated pneumonia   Acute  on chronic combined systolic and diastolic CHF (congestive heart failure)   Acute on chronic respiratory failure with hypoxia   Tracheostomy status   Acute on chronic respiratory failure hypoxia-unfortunately was not successful with 4-hour pressure support goal yesterday due to low tidal volumes, was tried twice.  Will reattempt 4-hour pressure support goal again today. Chronic heart failure with reduced  ejection fraction patient's last ejection fraction was 20% we will continue to monitor fluid status diuresis as tolerated follow labs as closely as possible. Healthcare associated pneumonia patient has been treated with antibiotics at the transferring facility.  Latest chest x-ray shows some atelectasis versus consolidation in the lower lobes Tracheostomy status will continue with routine tracheal care Myotonic dystrophy supportive care and physical therapy to evaluate   I have personally seen and evaluated the patient, evaluated laboratory and imaging results, formulated the assessment and plan and placed orders. The Patient requires high complexity decision making with multiple systems involvement.  Rounds were done with the Respiratory Therapy Director and Staff therapists and discussed with nursing staff also.  Yevonne Pax, MD [facility] Pulmonary Critical Care Medicine Sleep Medicine

## 2022-12-10 LAB — BASIC METABOLIC PANEL
Anion gap: 10 (ref 5–15)
BUN: 18 mg/dL (ref 6–20)
CO2: 22 mmol/L (ref 22–32)
Calcium: 8.6 mg/dL — ABNORMAL LOW (ref 8.9–10.3)
Chloride: 107 mmol/L (ref 98–111)
Creatinine, Ser: 0.63 mg/dL (ref 0.44–1.00)
GFR, Estimated: >60 mL/min (ref >60)
Glucose, Bld: 98 mg/dL (ref 70–99)
Potassium: 4.2 mmol/L (ref 3.5–5.1)
Sodium: 139 mmol/L (ref 135–145)

## 2022-12-10 LAB — CBC
HCT: 43.2 % (ref 36.0–46.0)
Hemoglobin: 13.3 g/dL (ref 12.0–15.0)
MCH: 31.5 pg (ref 26.0–34.0)
MCHC: 30.8 g/dL (ref 30.0–36.0)
MCV: 102.4 fL — ABNORMAL HIGH (ref 80.0–100.0)
Platelets: 369 10*3/uL (ref 150–400)
RBC: 4.22 MIL/uL (ref 3.87–5.11)
RDW: 15.8 % — ABNORMAL HIGH (ref 11.5–15.5)
WBC: 10.7 10*3/uL — ABNORMAL HIGH (ref 4.0–10.5)
nRBC: 0.2 % (ref 0.0–0.2)

## 2022-12-10 NOTE — Progress Notes (Cosign Needed)
Pulmonary Critical Care Medicine [facility] [place]   PULMONARY CRITICAL CARE SERVICE  PROGRESS NOTE     Cynthia Underwood  [id number]:[id number]  DOB: Jan 18, 1971   DOA: 12/06/2022  Referring Physician: Luna Kitchens, MD  HPI: Cynthia Underwood is a 52 y.o. female being followed for ventilator/airway/oxygen weaning Acute on Chronic Respiratory Failure.  Patient seen lying in bed, currently on pressure support 12/5 FiO2 40%.  Currently working towards 8-hour pressure support goal, successfully completed 4-hour pressure support goal yesterday.  Medications: Reviewed on Rounds  Physical Exam:  Vitals: Temp 97.5, pulse 81, respirations 14, BP 135/85, SpO2 99%  Ventilator Settings pressure support 12/5 40% FiO2  General: Comfortable at this time Neck: supple Cardiovascular: no malignant arrhythmias Respiratory: Bilaterally diminished Skin: no rash seen on limited exam Musculoskeletal: No gross abnormality Psychiatric:unable to assess Neurologic:no involuntary movements         Lab Data:   Basic Metabolic Panel: Recent Labs  Lab 12/05/22 0152 12/06/22 0051 12/08/22 1111 12/09/22 0120 12/10/22 0147  NA 142 145 147* 146* 139  K 4.6 3.3* 4.4 3.5 4.2  CL 103 101 104 107 107  CO2 25 27 25 25 22   GLUCOSE 144* 117* 147* 135* 98  BUN 43* 37* 32* 24* 18  CREATININE 0.76 0.85 0.88 0.74 0.63  CALCIUM 9.4 10.3 9.7 9.3 8.6*  MG  --   --  2.5*  --   --     ABG: Recent Labs  Lab 12/06/22 1714  PHART 7.54*  PCO2ART 30*  PO2ART 72*  HCO3 25.7  O2SAT 96.4    Liver Function Tests: No results for input(s): "AST", "ALT", "ALKPHOS", "BILITOT", "PROT", "ALBUMIN" in the last 168 hours. No results for input(s): "LIPASE", "AMYLASE" in the last 168 hours. No results for input(s): "AMMONIA" in the last 168 hours.  CBC: Recent Labs  Lab 12/04/22 0247 12/05/22 0152 12/06/22 0051 12/08/22 1111 12/09/22 0120 12/10/22 0147  WBC 9.1  9.1 11.1* 15.4* 17.5* 15.4* 10.7*   NEUTROABS 5.3  --   --   --  10.9*  --   HGB 11.4*  11.3* 11.1* 13.5 13.5 13.0 13.3  HCT 35.7*  35.6* 35.4* 43.6 42.1 40.9 43.2  MCV 98.1  97.5 98.6 100.2* 99.3 99.3 102.4*  PLT 686*  705* 605* 849* 619* 524* 369    Cardiac Enzymes: No results for input(s): "CKTOTAL", "CKMB", "CKMBINDEX", "TROPONINI" in the last 168 hours.  BNP (last 3 results) Recent Labs    11/11/22 1238 11/17/22 2150  BNP 58.5 145.2*    ProBNP (last 3 results) No results for input(s): "PROBNP" in the last 8760 hours.  Radiological Exams: No results found.  Assessment/Plan Active Problems:   Myotonic dystrophy   Healthcare-associated pneumonia   Acute on chronic combined systolic and diastolic CHF (congestive heart failure)   Acute on chronic respiratory failure with hypoxia   Tracheostomy status   Acute on chronic respiratory failure hypoxia-patient successfully completed 4-hour pressure support goal yesterday and currently working on 8-hour pressure support goal today. Chronic heart failure with reduced ejection fraction patient's last ejection fraction was 20% we will continue to monitor fluid status diuresis as tolerated follow labs as closely as possible. Healthcare associated pneumonia patient has been treated with antibiotics at the transferring facility.  Latest chest x-ray shows some atelectasis versus consolidation in the lower lobes Tracheostomy status will continue with routine tracheal care Myotonic dystrophy supportive care and physical therapy to evaluate   I have personally seen and evaluated  the patient, evaluated laboratory and imaging results, formulated the assessment and plan and placed orders. The Patient requires high complexity decision making with multiple systems involvement.  Rounds were done with the Respiratory Therapy Director and Staff therapists and discussed with nursing staff also.  Allyne Gee, MD [facility] Pulmonary Critical Care Medicine Sleep Medicine

## 2022-12-11 NOTE — Progress Notes (Cosign Needed)
Pulmonary Critical Care Medicine [facility] [place]   PULMONARY CRITICAL CARE SERVICE  PROGRESS NOTE     Cynthia Underwood  FVC:[id number]  DOB: 04/16/1971   DOA: 12/06/2022  Referring Physician: Luna Kitchens, MD  HPI: Cynthia Underwood is a 52 y.o. female being followed for ventilator/airway/oxygen weaning Acute on Chronic Respiratory Failure.  Patient seen sitting up in bed, currently on pressure support working towards 12-hour goal.  Successfully completed 8-hour pressure support goal yesterday.  Medications: Reviewed on Rounds  Physical Exam:  Vitals: Temp 97.2, pulse 100, respirations 18, BP 119/75, SpO2 97%  Ventilator Settings pressure support 12/5 FiO2 40%  General: Comfortable at this time Neck: supple Cardiovascular: no malignant arrhythmias Respiratory: Bilaterally diminished Skin: no rash seen on limited exam Musculoskeletal: No gross abnormality Psychiatric:unable to assess Neurologic:no involuntary movements         Lab Data:   Basic Metabolic Panel: Recent Labs  Lab 12/05/22 0152 12/06/22 0051 12/08/22 1111 12/09/22 0120 12/10/22 0147  NA 142 145 147* 146* 139  K 4.6 3.3* 4.4 3.5 4.2  CL 103 101 104 107 107  CO2 25 27 25 25 22   GLUCOSE 144* 117* 147* 135* 98  BUN 43* 37* 32* 24* 18  CREATININE 0.76 0.85 0.88 0.74 0.63  CALCIUM 9.4 10.3 9.7 9.3 8.6*  MG  --   --  2.5*  --   --     ABG: Recent Labs  Lab 12/06/22 1714  PHART 7.54*  PCO2ART 30*  PO2ART 72*  HCO3 25.7  O2SAT 96.4    Liver Function Tests: No results for input(s): "AST", "ALT", "ALKPHOS", "BILITOT", "PROT", "ALBUMIN" in the last 168 hours. No results for input(s): "LIPASE", "AMYLASE" in the last 168 hours. No results for input(s): "AMMONIA" in the last 168 hours.  CBC: Recent Labs  Lab 12/05/22 0152 12/06/22 0051 12/08/22 1111 12/09/22 0120 12/10/22 0147  WBC 11.1* 15.4* 17.5* 15.4* 10.7*  NEUTROABS  --   --   --  10.9*  --   HGB 11.1* 13.5 13.5  13.0 13.3  HCT 35.4* 43.6 42.1 40.9 43.2  MCV 98.6 100.2* 99.3 99.3 102.4*  PLT 605* 849* 619* 524* 369    Cardiac Enzymes: No results for input(s): "CKTOTAL", "CKMB", "CKMBINDEX", "TROPONINI" in the last 168 hours.  BNP (last 3 results) Recent Labs    11/11/22 1238 11/17/22 2150  BNP 58.5 145.2*    ProBNP (last 3 results) No results for input(s): "PROBNP" in the last 8760 hours.  Radiological Exams: No results found.  Assessment/Plan Active Problems:   Myotonic dystrophy   Healthcare-associated pneumonia   Acute on chronic combined systolic and diastolic CHF (congestive heart failure)   Acute on chronic respiratory failure with hypoxia   Tracheostomy status   Acute on chronic respiratory failure hypoxia-patient successfully completed 8-hour pressure support goal yesterday and currently working on 12-hour pressure support goal today. Chronic heart failure with reduced ejection fraction patient's last ejection fraction was 20% we will continue to monitor fluid status diuresis as tolerated follow labs as closely as possible. Healthcare associated pneumonia patient has been treated with antibiotics at the transferring facility.  Latest chest x-ray shows some atelectasis versus consolidation in the lower lobes Tracheostomy status will continue with routine tracheal care Myotonic dystrophy supportive care and physical therapy to evaluate   I have personally seen and evaluated the patient, evaluated laboratory and imaging results, formulated the assessment and plan and placed orders. The Patient requires high complexity decision making with  multiple systems involvement.  Rounds were done with the Respiratory Therapy Director and Staff therapists and discussed with nursing staff also.  Allyne Gee, MD [facility] Pulmonary Critical Care Medicine Sleep Medicine

## 2022-12-12 DIAGNOSIS — Z930 Tracheostomy status: Secondary | ICD-10-CM | POA: Diagnosis not present

## 2022-12-12 DIAGNOSIS — J9621 Acute and chronic respiratory failure with hypoxia: Secondary | ICD-10-CM | POA: Diagnosis not present

## 2022-12-12 DIAGNOSIS — I5043 Acute on chronic combined systolic (congestive) and diastolic (congestive) heart failure: Secondary | ICD-10-CM | POA: Diagnosis not present

## 2022-12-12 LAB — CULTURE, RESPIRATORY W GRAM STAIN

## 2022-12-12 LAB — CULTURE, BLOOD (ROUTINE X 2): Special Requests: ADEQUATE

## 2022-12-12 NOTE — Progress Notes (Cosign Needed)
Pulmonary Critical Care Medicine [facility] [place]   PULMONARY CRITICAL CARE SERVICE  PROGRESS NOTE     SHANTAYA MARIE  QKM:[id number]  DOB: October 03, 1970   DOA: 12/06/2022  Referring Physician: Luna Kitchens, MD  HPI: AILANY ESKELSON is a 52 y.o. female being followed for ventilator/airway/oxygen weaning Acute on Chronic Respiratory Failure.  Patient seen lying in bed, currently on pressure support 35%.  Working towards 16-hour goal.  Successfully completed 12 hours pressure support goal yesterday.  Medications: Reviewed on Rounds  Physical Exam:  Vitals: Temp 98.1, pulse 98, respirations 20, BP 108/67, SpO2 99%  Ventilator Settings pressure support 12/5 FiO2 35%  General: Comfortable at this time Neck: supple Cardiovascular: no malignant arrhythmias Respiratory: Bilaterally diminished Skin: no rash seen on limited exam Musculoskeletal: No gross abnormality Psychiatric:unable to assess Neurologic:no involuntary movements         Lab Data:   Basic Metabolic Panel: Recent Labs  Lab 12/06/22 0051 12/08/22 1111 12/09/22 0120 12/10/22 0147  NA 145 147* 146* 139  K 3.3* 4.4 3.5 4.2  CL 101 104 107 107  CO2 27 25 25 22   GLUCOSE 117* 147* 135* 98  BUN 37* 32* 24* 18  CREATININE 0.85 0.88 0.74 0.63  CALCIUM 10.3 9.7 9.3 8.6*  MG  --  2.5*  --   --     ABG: Recent Labs  Lab 12/06/22 1714  PHART 7.54*  PCO2ART 30*  PO2ART 72*  HCO3 25.7  O2SAT 96.4    Liver Function Tests: No results for input(s): "AST", "ALT", "ALKPHOS", "BILITOT", "PROT", "ALBUMIN" in the last 168 hours. No results for input(s): "LIPASE", "AMYLASE" in the last 168 hours. No results for input(s): "AMMONIA" in the last 168 hours.  CBC: Recent Labs  Lab 12/06/22 0051 12/08/22 1111 12/09/22 0120 12/10/22 0147  WBC 15.4* 17.5* 15.4* 10.7*  NEUTROABS  --   --  10.9*  --   HGB 13.5 13.5 13.0 13.3  HCT 43.6 42.1 40.9 43.2  MCV 100.2* 99.3 99.3 102.4*  PLT 849* 619*  524* 369    Cardiac Enzymes: No results for input(s): "CKTOTAL", "CKMB", "CKMBINDEX", "TROPONINI" in the last 168 hours.  BNP (last 3 results) Recent Labs    11/11/22 1238 11/17/22 2150  BNP 58.5 145.2*    ProBNP (last 3 results) No results for input(s): "PROBNP" in the last 8760 hours.  Radiological Exams: No results found.  Assessment/Plan Active Problems:   Myotonic dystrophy   Healthcare-associated pneumonia   Acute on chronic combined systolic and diastolic CHF (congestive heart failure)   Acute on chronic respiratory failure with hypoxia   Tracheostomy status   Acute on chronic respiratory failure hypoxia-patient successfully completed 12-hour pressure support goal yesterday and currently working on 16-hour pressure support goal today. Chronic heart failure with reduced ejection fraction patient's last ejection fraction was 20% we will continue to monitor fluid status diuresis as tolerated follow labs as closely as possible. Healthcare associated pneumonia patient has been treated with antibiotics at the transferring facility.  Latest chest x-ray shows some atelectasis versus consolidation in the lower lobes Tracheostomy status will continue with routine tracheal care Myotonic dystrophy supportive care and physical therapy to evaluate   I have personally seen and evaluated the patient, evaluated laboratory and imaging results, formulated the assessment and plan and placed orders. The Patient requires high complexity decision making with multiple systems involvement.  Rounds were done with the Respiratory Therapy Director and Staff therapists and discussed with nursing staff also.  Allyne Gee, MD [facility] Pulmonary Critical Care Medicine Sleep Medicine

## 2022-12-13 DIAGNOSIS — I5043 Acute on chronic combined systolic (congestive) and diastolic (congestive) heart failure: Secondary | ICD-10-CM | POA: Diagnosis not present

## 2022-12-13 DIAGNOSIS — J9621 Acute and chronic respiratory failure with hypoxia: Secondary | ICD-10-CM | POA: Diagnosis not present

## 2022-12-13 DIAGNOSIS — Z930 Tracheostomy status: Secondary | ICD-10-CM | POA: Diagnosis not present

## 2022-12-13 LAB — CBC
HCT: 36.9 % (ref 36.0–46.0)
Hemoglobin: 11.7 g/dL — ABNORMAL LOW (ref 12.0–15.0)
MCH: 31.6 pg (ref 26.0–34.0)
MCHC: 31.7 g/dL (ref 30.0–36.0)
MCV: 99.7 fL (ref 80.0–100.0)
Platelets: 110 10*3/uL — ABNORMAL LOW (ref 150–400)
RBC: 3.7 MIL/uL — ABNORMAL LOW (ref 3.87–5.11)
RDW: 15.5 % (ref 11.5–15.5)
WBC: 12.9 10*3/uL — ABNORMAL HIGH (ref 4.0–10.5)
nRBC: 0.2 % (ref 0.0–0.2)

## 2022-12-13 LAB — COMPREHENSIVE METABOLIC PANEL
ALT: 61 U/L — ABNORMAL HIGH (ref 0–44)
AST: 46 U/L — ABNORMAL HIGH (ref 15–41)
Albumin: 2.6 g/dL — ABNORMAL LOW (ref 3.5–5.0)
Alkaline Phosphatase: 109 U/L (ref 38–126)
Anion gap: 13 (ref 5–15)
BUN: 13 mg/dL (ref 6–20)
CO2: 25 mmol/L (ref 22–32)
Calcium: 9.1 mg/dL (ref 8.9–10.3)
Chloride: 98 mmol/L (ref 98–111)
Creatinine, Ser: 0.68 mg/dL (ref 0.44–1.00)
GFR, Estimated: >60 mL/min (ref >60)
Glucose, Bld: 107 mg/dL — ABNORMAL HIGH (ref 70–99)
Potassium: 5 mmol/L (ref 3.5–5.1)
Sodium: 136 mmol/L (ref 135–145)
Total Bilirubin: 1 mg/dL (ref 0.3–1.2)
Total Protein: 8.3 g/dL — ABNORMAL HIGH (ref 6.5–8.1)

## 2022-12-13 LAB — BASIC METABOLIC PANEL
Anion gap: 11 (ref 5–15)
BUN: 14 mg/dL (ref 6–20)
CO2: 24 mmol/L (ref 22–32)
Calcium: 9.1 mg/dL (ref 8.9–10.3)
Chloride: 98 mmol/L (ref 98–111)
Creatinine, Ser: 0.64 mg/dL (ref 0.44–1.00)
GFR, Estimated: >60 mL/min (ref >60)
Glucose, Bld: 105 mg/dL — ABNORMAL HIGH (ref 70–99)
Potassium: 4.1 mmol/L (ref 3.5–5.1)
Sodium: 133 mmol/L — ABNORMAL LOW (ref 135–145)

## 2022-12-13 LAB — MAGNESIUM
Magnesium: 2.1 mg/dL (ref 1.7–2.4)
Magnesium: 2.3 mg/dL (ref 1.7–2.4)

## 2022-12-13 LAB — CULTURE, BLOOD (ROUTINE X 2): Culture: NO GROWTH

## 2022-12-13 LAB — D-DIMER, QUANTITATIVE: D-Dimer, Quant: 1.11 ug/mL-FEU — ABNORMAL HIGH (ref 0.00–0.50)

## 2022-12-13 NOTE — Consult Note (Signed)
Referring Physician: Luna KitchensStephanie Brown, MD  Cynthia Underwood is an 52 y.o. female.                       Chief Complaint: CHF  HPI: 52 years old black female with non-ischemic cardiomyopathy with normal coronaries on recent cardiac catheterization has acute on chronic respiratory failure. She is currently on pressure support with 35 % FiO2 by T-piece on tracheostomy. She has pacemaker implant in 02/2017. Her echocardiogram last month showed bi-ventricular failure with 20 % LV EF. Post thyroidectomy in 04/2021 for multinodular goiter, she is on levothyroxine 112 mcg daily. Last TSH on 11/11/2022 was normal at 1.978 uIU/mL.  Chest x-ray is negative for pulmonary edema. EKG shows sinus tachycardia, 1st degree AV block and v paced rhythm. 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   Sickle cell trait (HCC)    Watery eyes    Left   Weakness of both legs       Past Surgical History:  Procedure Laterality Date   BREAST BIOPSY Right    EYE SURGERY Left 2017   "related to blockage in my nose"   EYE SURGERY Right 05/2019   INSERT / REPLACE / REMOVE PACEMAKER  02/15/2017   PACEMAKER IMPLANT N/A 02/15/2017   Procedure: Pacemaker Implant;  Surgeon: Duke SalviaKlein, Steven C, MD;  [facility];  Service: Cardiovascular;  Laterality: N/A;   RIGHT/LEFT HEART CATH AND CORONARY ANGIOGRAPHY N/A 11/16/2022   Procedure: RIGHT/LEFT HEART CATH AND CORONARY ANGIOGRAPHY;  Surgeon: Laurey MoraleMcLean, Dalton S, MD;  [facility];  Service: Cardiovascular;  Laterality: N/A;   THYROIDECTOMY N/A 04/21/2021   Procedure: TOTAL THYROIDECTOMY;  Surgeon: Axel Filleramirez, Armando, MD;  [facility];  Service: General;  Laterality: N/A;    Family History   Problem Relation Age of Onset   Cancer Father [id number]       Deceased   Heart disease Father    COPD Mother    Diabetes Maternal Uncle    Heart disease Maternal Uncle    Heart disease Paternal Grandmother    Diabetes Paternal Grandmother    Hypertension Paternal Grandmother    Neuromuscular disorder Maternal Aunt        Myotonic dystrophy   Neuromuscular disorder Cousin        Myotonic dystrophy   Neuromuscular disorder Sister        Myotonic dystrophy   Colon cancer Neg Hx    Colon polyps Neg Hx    Esophageal cancer Neg Hx    Rectal cancer Neg Hx    Stomach cancer Neg Hx    Social History:  reports that she has been smoking cigarettes. She has never used smokeless tobacco. She reports that she does not currently use alcohol. She reports that she does not currently use drugs after having used the following drugs: Marijuana.  Allergies:  Allergies  Allergen Reactions   Penicillin G Other (See Comments), Rash and Nausea And Vomiting    Sore throat   Penicillins Nausea And Vomiting, Rash and Other (See Comments)    Fever Has patient had a PCN reaction causing immediate rash, facial/tongue/throat swelling, SOB or  lightheadedness with hypotension: Yes Has patient had a PCN reaction causing severe rash involving mucus membranes or skin necrosis: Unknown Has patient had a PCN reaction that required hospitalization: No Has patient had a PCN reaction occurring within the last 10 years: No If all of the above answers are "NO", then may proceed with Cephalosporin use.     Medications Prior to Admission  Medication Sig Dispense Refill   clonazePAM (KLONOPIN) 1 MG tablet Place 1 tablet (1 mg total) into feeding tube every 8 (eight) hours. 30 tablet 0   levothyroxine (SYNTHROID) 112 MCG tablet Place 1 tablet (112 mcg total) into feeding tube daily before breakfast.     Nutritional Supplements (FEEDING SUPPLEMENT, OSMOLITE 1.5 CAL,) LIQD Place 1,000 mLs into feeding tube continuous.  0    pantoprazole (PROTONIX) 40 MG injection Inject 40 mg into the vein at bedtime. 1 each    Protein (FEEDING SUPPLEMENT, PROSOURCE TF20,) liquid Place 60 mLs into feeding tube daily.     QUEtiapine (SEROQUEL) 25 MG tablet Place 1 tablet (25 mg total) into feeding tube at bedtime.     sertraline (ZOLOFT) 25 MG tablet Place 3 tablets (75 mg total) into feeding tube daily.     spironolactone (ALDACTONE) 25 MG tablet Place 0.5 tablets (12.5 mg total) into feeding tube daily.      Results for orders placed or performed during the hospital encounter of 12/06/22 (from the past 48 hour(s))  CBC     Status: Abnormal   Collection Time: 12/13/22  2:52 AM  Result Value Ref Range   WBC 12.9 (H) 4.0 - 10.5 K/uL   RBC 3.70 (L) 3.87 - 5.11 MIL/uL   Hemoglobin 11.7 (L) 12.0 - 15.0 g/dL   HCT 16.1 09.6 - 04.5 %   MCV 99.7 80.0 - 100.0 fL   MCH 31.6 26.0 - 34.0 pg   MCHC 31.7 30.0 - 36.0 g/dL   RDW 40.9 81.1 - 91.4 %   Platelets 110 (L) 150 - 400 K/uL    Comment: SPECIMEN CHECKED FOR CLOTS REPEATED TO VERIFY    nRBC 0.2 0.0 - 0.2 %    Comment: Performed at [facility], [address], [place], [place] [address]  Basic metabolic panel     Status: Abnormal   Collection Time: 12/13/22  6:39 AM  Result Value Ref Range   Sodium 133 (L) 135 - 145 mmol/L   Potassium 4.1 3.5 - 5.1 mmol/L   Chloride 98 98 - 111 mmol/L   CO2 24 22 - 32 mmol/L   Glucose, Bld 105 (H) 70 - 99 mg/dL    Comment: Glucose reference range applies only to samples taken after fasting for at least 8 hours.   BUN 14 6 - 20 mg/dL   Creatinine, Ser 6.21 0.44 - 1.00 mg/dL   Calcium 9.1 8.9 - 30.8 mg/dL   GFR, Estimated >65 >78 mL/min    Comment: (NOTE) Calculated using the CKD-EPI Creatinine Equation (2021)    Anion gap 11 5 - 15    Comment: Performed at [facility], [address] [place], [place] [address]  Magnesium     Status: None   Collection Time: 12/13/22  6:39 AM  Result Value Ref Range   Magnesium 2.1 1.7 -  2.4 mg/dL    Comment: Performed at [facility], [address] [place], [place] [address]  D-dimer, quantitative     Status: Abnormal   Collection Time: 12/13/22  3:33 PM  Result Value Ref Range   D-Dimer, Quant 1.11 (H) 0.00 - 0.50 ug/mL-FEU    Comment: (NOTE) At the manufacturer cut-off value of 0.5 g/mL FEU, this assay has a negative predictive value of 95-100%.This assay is intended for use in conjunction with a clinical pretest probability (PTP) assessment model to exclude pulmonary embolism (PE) and deep venous thrombosis (DVT) in outpatients suspected of PE or DVT. Results should be correlated with clinical presentation. Performed at [facility], [address], [place], [place] [address]   Comprehensive metabolic panel     Status: Abnormal   Collection Time: 12/13/22  3:33 PM  Result Value Ref Range   Sodium 136 135 - 145 mmol/L   Potassium 5.0 3.5 - 5.1 mmol/L    Comment: HEMOLYSIS AT THIS LEVEL MAY AFFECT RESULT   Chloride 98 98 - 111 mmol/L   CO2 25 22 - 32 mmol/L   Glucose, Bld 107 (H) 70 - 99 mg/dL    Comment: Glucose reference range applies only to samples taken after fasting for at least 8 hours.   BUN 13 6 - 20 mg/dL   Creatinine, Ser 7.25 0.44 - 1.00 mg/dL    Comment: DELTA CHECK NOTED   Calcium 9.1 8.9 - 10.3 mg/dL   Total Protein 8.3 (H) 6.5 - 8.1 g/dL   Albumin 2.6 (L) 3.5 - 5.0 g/dL   AST 46 (H) 15 - 41 U/L    Comment: HEMOLYSIS AT THIS LEVEL MAY AFFECT RESULT   ALT 61 (H) 0 - 44 U/L    Comment: HEMOLYSIS AT THIS LEVEL MAY AFFECT RESULT   Alkaline Phosphatase 109 38 - 126 U/L   Total Bilirubin 1.0 0.3 - 1.2 mg/dL    Comment: HEMOLYSIS AT THIS LEVEL MAY AFFECT RESULT   GFR, Estimated >60 >60 mL/min    Comment: (NOTE) Calculated using the CKD-EPI Creatinine Equation (2021)    Anion gap 13 5 - 15    Comment: Performed at [facility], [address] [place], [place] [address]  Magnesium     Status: None   Collection Time: 12/13/22   3:33 PM  Result Value Ref Range   Magnesium 2.3 1.7 - 2.4 mg/dL    Comment: Performed at [facility], [address], [place], [place] [address]   No results found.  Review Of Systems Constitutional: H/O fever, chills, weight loss. Eyes: No vision change, wears glasses. No discharge or pain. Ears: No hearing loss, No tinnitus. Respiratory: No asthma, COPD, positive pneumonias and shortness of breath. No hemoptysis. Cardiovascular: No chest pain, positive palpitation, leg edema. Gastrointestinal: No nausea, vomiting, diarrhea, constipation. No GI bleed. No hepatitis. Genitourinary: No dysuria, hematuria, kidney stone. No incontinance. Neurological: No headache, stroke, seizures.  Psychiatry: No psych facility admission for anxiety, depression, suicide. No detox. Skin: No rash. Musculoskeletal: h/o joint pain, fibromyalgia. No neck pain, back pain. Lymphadenopathy: No lymphadenopathy. Hematology: h/o anemia, no easy bruising.   Last menstrual period 12/03/2016. There is no height or weight on file to calculate BMI. General appearance: alert, cooperative, appears stated age and no distress Head: Normocephalic, atraumatic. Eyes: Underwood eyes, Pale pink conjunctiva, corneas clear. Neck: No adenopathy, no carotid bruit, no JVD, supple, symmetrical, tracheostomy present. Resp: Clearing to auscultation bilaterally. Cardio: Regular rate and rhythm, S1, S2 normal, II/VI systolic murmur, no click, rub or gallop GI: Soft, non-tender; bowel sounds normal; no organomegaly. Extremities: Trace ankle edema, no cyanosis or clubbing. Skin: Warm and dry.  Neurologic: Alert  and oriented X 2, decreased strength. Normal coordination.  Assessment/Plan Acute on chronic systolic left heart failure, HFrEF Right ventricular failure Non-ischemic cardiomyopathy Acute on chronic respiratory failure with hypoxia Iron deficiency anemia S/P thyroidectomy S/P Pacemaker placement S/P  Tracheostomy Healthcare associated pneumonia Myotonic dystrophy Hypoalbuminemia  Plan: Add digoxin for RV and LV failure Small dose beta-blocker if tolerated. Iron supplement per primary care  Time spent: Review of old records, Lab, x-rays, EKG, other cardiac tests, examination, discussion with patient/Nurse/Tech/Doctor over 70 minutes.  Ricki Rodriguez, MD  12/13/2022, 5:33 PM

## 2022-12-14 DIAGNOSIS — J9621 Acute and chronic respiratory failure with hypoxia: Secondary | ICD-10-CM | POA: Diagnosis not present

## 2022-12-14 DIAGNOSIS — I5043 Acute on chronic combined systolic (congestive) and diastolic (congestive) heart failure: Secondary | ICD-10-CM | POA: Diagnosis not present

## 2022-12-14 DIAGNOSIS — Z930 Tracheostomy status: Secondary | ICD-10-CM | POA: Diagnosis not present

## 2022-12-14 LAB — TROPONIN I (HIGH SENSITIVITY)
Troponin I (High Sensitivity): 8 ng/L (ref <18)
Troponin I (High Sensitivity): 6 ng/L (ref <18)
Troponin I (High Sensitivity): 6 ng/L (ref <18)

## 2022-12-14 LAB — CULTURE, BLOOD (ROUTINE X 2): Culture: NO GROWTH

## 2022-12-14 NOTE — Progress Notes (Signed)
Pulmonary Critical Care Medicine [facility] [place]   PULMONARY CRITICAL CARE SERVICE  PROGRESS NOTE     Cynthia Underwood  WYS:[id number]  DOB: 1971/07/26   DOA: 12/06/2022  Referring Physician: Luna Kitchens, MD  HPI: Cynthia Underwood is a 52 y.o. female being followed for ventilator/airway/oxygen weaning Acute on Chronic Respiratory Failure.  Patient is afebrile resting comfortably on T collar with a goal of 4 hours  Medications: Reviewed on Rounds  Physical Exam:  Vitals: Temperature is 98.0 pulse 78 respiratory is 25 blood pressure 108/68 saturation is 100%  Ventilator Settings on T collar FiO2 is 35%  General: Comfortable at this time Neck: supple Cardiovascular: no malignant arrhythmias Respiratory: Scattered rhonchi expansion is equal Skin: no rash seen on limited exam Musculoskeletal: No gross abnormality Psychiatric:unable to assess Neurologic:no involuntary movements         Lab Data:   Basic Metabolic Panel: Recent Labs  Lab 12/08/22 1111 12/09/22 0120 12/10/22 0147 12/13/22 0639 12/13/22 1533  NA 147* 146* 139 133* 136  K 4.4 3.5 4.2 4.1 5.0  CL 104 107 107 98 98  CO2 25 25 22 24 25   GLUCOSE 147* 135* 98 105* 107*  BUN 32* 24* 18 14 13   CREATININE 0.88 0.74 0.63 0.64 0.68  CALCIUM 9.7 9.3 8.6* 9.1 9.1  MG 2.5*  --   --  2.1 2.3    ABG: No results for input(s): "PHART", "PCO2ART", "PO2ART", "HCO3", "O2SAT" in the last 168 hours.  Liver Function Tests: Recent Labs  Lab 12/13/22 1533  AST 46*  ALT 61*  ALKPHOS 109  BILITOT 1.0  PROT 8.3*  ALBUMIN 2.6*   No results for input(s): "LIPASE", "AMYLASE" in the last 168 hours. No results for input(s): "AMMONIA" in the last 168 hours.  CBC: Recent Labs  Lab 12/08/22 1111 12/09/22 0120 12/10/22 0147 12/13/22 0252  WBC 17.5* 15.4* 10.7* 12.9*  NEUTROABS  --  10.9*  --   --   HGB 13.5 13.0 13.3 11.7*  HCT 42.1 40.9 43.2 36.9  MCV 99.3 99.3 102.4* 99.7  PLT 619* 524*  369 110*    Cardiac Enzymes: No results for input(s): "CKTOTAL", "CKMB", "CKMBINDEX", "TROPONINI" in the last 168 hours.  BNP (last 3 results) Recent Labs    11/11/22 1238 11/17/22 2150  BNP 58.5 145.2*    ProBNP (last 3 results) No results for input(s): "PROBNP" in the last 8760 hours.  Radiological Exams: No results found.  Assessment/Plan Active Problems:   Myotonic dystrophy   Healthcare-associated pneumonia   Acute on chronic combined systolic and diastolic CHF (congestive heart failure)   Acute on chronic respiratory failure with hypoxia   Tracheostomy status   Acute on chronic respiratory failure hypoxia will continue with T collar trials and the goal is 4 hours as mentioned Myotonic dystrophy at baseline supportive care Healthcare associated pneumonia has been treated we will continue to monitor Chronic systolic diastolic heart failure supportive care monitor fluid status Tracheostomy remains in place   I have personally seen and evaluated the patient, evaluated laboratory and imaging results, formulated the assessment and plan and placed orders. The Patient requires high complexity decision making with multiple systems involvement.  Rounds were done with the Respiratory Therapy Director and Staff therapists and discussed with nursing staff also.  Yevonne Pax, MD [facility] Pulmonary Critical Care Medicine Sleep Medicine

## 2022-12-15 DIAGNOSIS — Z930 Tracheostomy status: Secondary | ICD-10-CM | POA: Diagnosis not present

## 2022-12-15 DIAGNOSIS — I5043 Acute on chronic combined systolic (congestive) and diastolic (congestive) heart failure: Secondary | ICD-10-CM | POA: Diagnosis not present

## 2022-12-15 DIAGNOSIS — J9621 Acute and chronic respiratory failure with hypoxia: Secondary | ICD-10-CM | POA: Diagnosis not present

## 2022-12-15 NOTE — Consult Note (Signed)
Ref: Shayne Alken, MD   Subjective:  Awake. VS stable  She is off ventilator. Her FiO2 is 35 % by T-piece. Sinus tachycardia resolved. V-paced rhythm continues with 1st degree AV block.  Objective:  Vital Signs in the last 24 hours:  P:69, R; 17, BP: 100/60, O2 sat 99 % on 35 % FiO2 by T-piece  Physical Exam: BP Readings from Last 1 Encounters:  12/06/22 (!) 126/95     Wt Readings from Last 1 Encounters:  12/06/22 39.9 kg    Weight change:  There is no height or weight on file to calculate BMI. HEENT: [place]/AT, Eyes-Brown, Conjunctiva-Pink, Sclera-Non-icteric Neck: No JVD, No bruit, Tracheostomy present. Lungs:  Clearing, Bilateral. Cardiac:  Regular rhythm, normal S1 and S2, no S3. II/VI systolic murmur. Abdomen:  Soft, non-tender. BS present. Extremities:  No edema present. No cyanosis. No clubbing. CNS: AxOx2, Cranial nerves grossly intact, moves all 4 extremities.  Skin: Warm and dry.   Intake/Output from previous day: No intake/output data recorded.    Lab Results: BMET    Component Value Date/Time   NA 136 12/13/2022 1533   NA 133 (L) 12/13/2022 0639   NA 139 12/10/2022 0147   NA 143 10/04/2022 1638   NA 146 (H) 01/11/2022 1613   NA 144 09/11/2018 1132   K 5.0 12/13/2022 1533   K 4.1 12/13/2022 0639   K 4.2 12/10/2022 0147   CL 98 12/13/2022 1533   CL 98 12/13/2022 0639   CL 107 12/10/2022 0147   CO2 25 12/13/2022 1533   CO2 24 12/13/2022 0639   CO2 22 12/10/2022 0147   GLUCOSE 107 (H) 12/13/2022 1533   GLUCOSE 105 (H) 12/13/2022 0639   GLUCOSE 98 12/10/2022 0147   BUN 13 12/13/2022 1533   BUN 14 12/13/2022 0639   BUN 18 12/10/2022 0147   BUN 12 10/04/2022 1638   BUN 11 01/11/2022 1613   BUN 15 09/11/2018 1132   CREATININE 0.68 12/13/2022 1533   CREATININE 0.64 12/13/2022 0639   CREATININE 0.63 12/10/2022 0147   CREATININE 0.74 02/14/2018 1225   CALCIUM 9.1 12/13/2022 1533   CALCIUM 9.1 12/13/2022 0639   CALCIUM 8.6 (L) 12/10/2022  0147   GFRNONAA >60 12/13/2022 1533   GFRNONAA >60 12/13/2022 0639   GFRNONAA >60 12/10/2022 0147   GFRNONAA >60 02/14/2018 1225   GFRAA 92 09/11/2018 1132   GFRAA 112 09/03/2018 1207   GFRAA >60 08/27/2018 2308   GFRAA >60 02/14/2018 1225   CBC    Component Value Date/Time   WBC 12.9 (H) 12/13/2022 0252   RBC 3.70 (L) 12/13/2022 0252   HGB 11.7 (L) 12/13/2022 0252   HGB 12.6 10/04/2022 1638   HCT 36.9 12/13/2022 0252   HCT 37.6 10/04/2022 1638   PLT 110 (L) 12/13/2022 0252   PLT 192 10/04/2022 1638   MCV 99.7 12/13/2022 0252   MCV 94 10/04/2022 1638   MCH 31.6 12/13/2022 0252   MCHC 31.7 12/13/2022 0252   RDW 15.5 12/13/2022 0252   RDW 15.1 10/04/2022 1638   LYMPHSABS 2.9 12/09/2022 0120   LYMPHSABS 2.1 10/04/2022 1638   MONOABS 1.3 (H) 12/09/2022 0120   EOSABS 0.1 12/09/2022 0120   EOSABS 0.1 10/04/2022 1638   BASOSABS 0.1 12/09/2022 0120   BASOSABS 0.0 10/04/2022 1638   HEPATIC Function Panel Recent Labs    11/20/22 0421 11/21/22 0112 12/13/22 1533  PROT 6.0* 6.4* 8.3*  ALBUMIN 2.1* 2.2* 2.6*  AST 21 29 46*  ALT 34  35 61*  ALKPHOS 53 60 109   HEMOGLOBIN A1C Lab Results  Component Value Date   MPG 114 11/15/2022   CARDIAC ENZYMES No results found for: "CKTOTAL", "CKMB", "CKMBINDEX", "TROPONINI" BNP No results for input(s): "PROBNP" in the last 8760 hours. TSH Recent Labs    01/11/22 1613 10/04/22 1638 11/11/22 2045  TSH 0.013* 19.400* 1.978   CHOLESTEROL No results for input(s): "CHOL" in the last 8760 hours.  Scheduled Meds: Continuous Infusions: PRN Meds:.  Assessment/Plan: Acute on chronic systolic left heart failure Right heart failure Non-ischemic cardiomyopathy Acute on chronic respiratory failure with hypoxia Iron deficiency anemia Sickle cell trait S/P Pacemaker placement S/P Tracheostomy Healthcare associated pneumonia Myotonic dystrophy Hypoalbuminemia  Plan: Continue low dose digoxin and Beta-blocker Increase activity  as tolerated.   LOS: 0 days   Time spent including chart review, lab review, examination, discussion with patient/Nurse : 30 min   Orpah Cobb  MD  12/15/2022, 9:17 AM

## 2022-12-16 DIAGNOSIS — Z930 Tracheostomy status: Secondary | ICD-10-CM | POA: Diagnosis not present

## 2022-12-16 DIAGNOSIS — J9621 Acute and chronic respiratory failure with hypoxia: Secondary | ICD-10-CM | POA: Diagnosis not present

## 2022-12-16 DIAGNOSIS — I5043 Acute on chronic combined systolic (congestive) and diastolic (congestive) heart failure: Secondary | ICD-10-CM | POA: Diagnosis not present

## 2022-12-16 LAB — CBC
HCT: 38.6 % (ref 36.0–46.0)
Hemoglobin: 12.6 g/dL (ref 12.0–15.0)
MCH: 31.9 pg (ref 26.0–34.0)
MCHC: 32.6 g/dL (ref 30.0–36.0)
MCV: 97.7 fL (ref 80.0–100.0)
Platelets: 321 10*3/uL (ref 150–400)
RBC: 3.95 MIL/uL (ref 3.87–5.11)
RDW: 15.5 % (ref 11.5–15.5)
WBC: 8.7 10*3/uL (ref 4.0–10.5)
nRBC: 0 % (ref 0.0–0.2)

## 2022-12-16 LAB — POTASSIUM: Potassium: 5 mmol/L (ref 3.5–5.1)

## 2022-12-16 LAB — BASIC METABOLIC PANEL
Anion gap: 12 (ref 5–15)
BUN: 20 mg/dL (ref 6–20)
CO2: 29 mmol/L (ref 22–32)
Calcium: 9.4 mg/dL (ref 8.9–10.3)
Chloride: 95 mmol/L — ABNORMAL LOW (ref 98–111)
Creatinine, Ser: 0.67 mg/dL (ref 0.44–1.00)
GFR, Estimated: >60 mL/min (ref >60)
Glucose, Bld: 109 mg/dL — ABNORMAL HIGH (ref 70–99)
Potassium: 5.5 mmol/L — ABNORMAL HIGH (ref 3.5–5.1)
Sodium: 136 mmol/L (ref 135–145)

## 2022-12-16 LAB — CULTURE, BLOOD (ROUTINE X 2)

## 2022-12-16 LAB — MAGNESIUM: Magnesium: 2.3 mg/dL (ref 1.7–2.4)

## 2022-12-16 NOTE — Progress Notes (Signed)
Pulmonary Critical Care Medicine [facility] [place]   PULMONARY CRITICAL CARE SERVICE  PROGRESS NOTE     Cynthia Underwood  MBE:[id number]  DOB: May 21, 1971   DOA: 12/06/2022  Referring Physician: Luna Kitchens, MD  HPI: Cynthia Underwood is a 52 y.o. female being followed for ventilator/airway/oxygen weaning Acute on Chronic Respiratory Failure.  Patient is currently on T collar comfortable without distress goal of 8 hours  Medications: Reviewed on Rounds  Physical Exam:  Vitals: Temperature is 97.3 pulse 89 respiratory 21 blood pressure 96/63 saturation 99%  Ventilator Settings on T collar FiO2 is 35%  General: Comfortable at this time Neck: supple Cardiovascular: no malignant arrhythmias Respiratory: Scattered rhonchi expansion is equal Skin: no rash seen on limited exam Musculoskeletal: No gross abnormality Psychiatric:unable to assess Neurologic:no involuntary movements         Lab Data:   Basic Metabolic Panel: Recent Labs  Lab 12/10/22 0147 12/13/22 0639 12/13/22 1533 12/16/22 0116  NA 139 133* 136 136  K 4.2 4.1 5.0 5.5*  CL 107 98 98 95*  CO2 22 24 25 29   GLUCOSE 98 105* 107* 109*  BUN 18 14 13 20   CREATININE 0.63 0.64 0.68 0.67  CALCIUM 8.6* 9.1 9.1 9.4  MG  --  2.1 2.3 2.3    ABG: No results for input(s): "PHART", "PCO2ART", "PO2ART", "HCO3", "O2SAT" in the last 168 hours.  Liver Function Tests: Recent Labs  Lab 12/13/22 1533  AST 46*  ALT 61*  ALKPHOS 109  BILITOT 1.0  PROT 8.3*  ALBUMIN 2.6*   No results for input(s): "LIPASE", "AMYLASE" in the last 168 hours. No results for input(s): "AMMONIA" in the last 168 hours.  CBC: Recent Labs  Lab 12/10/22 0147 12/13/22 0252 12/16/22 0116  WBC 10.7* 12.9* 8.7  HGB 13.3 11.7* 12.6  HCT 43.2 36.9 38.6  MCV 102.4* 99.7 97.7  PLT 369 110* 321    Cardiac Enzymes: No results for input(s): "CKTOTAL", "CKMB", "CKMBINDEX", "TROPONINI" in the last 168 hours.  BNP (last 3  results) Recent Labs    11/11/22 1238 11/17/22 2150  BNP 58.5 145.2*    ProBNP (last 3 results) No results for input(s): "PROBNP" in the last 8760 hours.  Radiological Exams: No results found.  Assessment/Plan Active Problems:   Myotonic dystrophy   Healthcare-associated pneumonia   Acute on chronic combined systolic and diastolic CHF (congestive heart failure)   Acute on chronic respiratory failure with hypoxia   Tracheostomy status   Acute on chronic respiratory failure hypoxia will continue with T collar trials continue goal of 8 hours. Myotonic dystrophy patient is at baseline Healthcare associated pneumonia has been treated we will continue to follow along closely. Tracheostomy status remains in place Combined systolic diastolic heart failure   I have personally seen and evaluated the patient, evaluated laboratory and imaging results, formulated the assessment and plan and placed orders. The Patient requires high complexity decision making with multiple systems involvement.  Rounds were done with the Respiratory Therapy Director and Staff therapists and discussed with nursing staff also.  Yevonne Pax, MD [facility] Pulmonary Critical Care Medicine Sleep Medicine

## 2022-12-16 NOTE — Progress Notes (Signed)
Pulmonary Critical Care Medicine [facility] [place]   PULMONARY CRITICAL CARE SERVICE  PROGRESS NOTE     NEENA DELONE  YIA:[id number]  DOB: 1971-02-13   DOA: 12/06/2022  Referring Physician: Luna Kitchens, MD  HPI: Cynthia Underwood is a 52 y.o. female being followed for ventilator/airway/oxygen weaning Acute on Chronic Respiratory Failure.  Patient is currently on T collar with a goal of 12 hours  Medications: Reviewed on Rounds  Physical Exam:  Vitals: Temperature is 96.7 pulse of 84 respiratory 21 blood pressure 119/71 saturation is 100%  Ventilator Settings off the ventilator currently on T collar FiO2 35%  General: Comfortable at this time Neck: supple Cardiovascular: no malignant arrhythmias Respiratory: Scattered rhonchi coarse breath sounds Skin: no rash seen on limited exam Musculoskeletal: No gross abnormality Psychiatric:unable to assess Neurologic:no involuntary movements         Lab Data:   Basic Metabolic Panel: Recent Labs  Lab 12/10/22 0147 12/13/22 0639 12/13/22 1533 12/16/22 0116 12/16/22 1129  NA 139 133* 136 136  --   K 4.2 4.1 5.0 5.5* 5.0  CL 107 98 98 95*  --   CO2 22 24 25 29   --   GLUCOSE 98 105* 107* 109*  --   BUN 18 14 13 20   --   CREATININE 0.63 0.64 0.68 0.67  --   CALCIUM 8.6* 9.1 9.1 9.4  --   MG  --  2.1 2.3 2.3  --     ABG: No results for input(s): "PHART", "PCO2ART", "PO2ART", "HCO3", "O2SAT" in the last 168 hours.  Liver Function Tests: Recent Labs  Lab 12/13/22 1533  AST 46*  ALT 61*  ALKPHOS 109  BILITOT 1.0  PROT 8.3*  ALBUMIN 2.6*   No results for input(s): "LIPASE", "AMYLASE" in the last 168 hours. No results for input(s): "AMMONIA" in the last 168 hours.  CBC: Recent Labs  Lab 12/10/22 0147 12/13/22 0252 12/16/22 0116  WBC 10.7* 12.9* 8.7  HGB 13.3 11.7* 12.6  HCT 43.2 36.9 38.6  MCV 102.4* 99.7 97.7  PLT 369 110* 321    Cardiac Enzymes: No results for input(s):  "CKTOTAL", "CKMB", "CKMBINDEX", "TROPONINI" in the last 168 hours.  BNP (last 3 results) Recent Labs    11/11/22 1238 11/17/22 2150  BNP 58.5 145.2*    ProBNP (last 3 results) No results for input(s): "PROBNP" in the last 8760 hours.  Radiological Exams: No results found.  Assessment/Plan Active Problems:   Myotonic dystrophy   Healthcare-associated pneumonia   Acute on chronic combined systolic and diastolic CHF (congestive heart failure)   Acute on chronic respiratory failure with hypoxia   Tracheostomy status   Acute on chronic respiratory failure with hypoxia plan is going to be to continue with the T collar weaning as tolerated goal of 12 hours Myotonic dystrophy supportive care will continue to monitor Healthcare associated pneumonia has been treated clinically improving Combined systolic diastolic heart failure no change Tracheostomy remains in place   I have personally seen and evaluated the patient, evaluated laboratory and imaging results, formulated the assessment and plan and placed orders. The Patient requires high complexity decision making with multiple systems involvement.  Rounds were done with the Respiratory Therapy Director and Staff therapists and discussed with nursing staff also.  Yevonne Pax, MD [facility] Pulmonary Critical Care Medicine Sleep Medicine

## 2022-12-17 DIAGNOSIS — Z930 Tracheostomy status: Secondary | ICD-10-CM | POA: Diagnosis not present

## 2022-12-17 DIAGNOSIS — D72829 Elevated white blood cell count, unspecified: Secondary | ICD-10-CM | POA: Diagnosis not present

## 2022-12-17 DIAGNOSIS — J9621 Acute and chronic respiratory failure with hypoxia: Secondary | ICD-10-CM | POA: Diagnosis not present

## 2022-12-17 DIAGNOSIS — J690 Pneumonitis due to inhalation of food and vomit: Secondary | ICD-10-CM | POA: Diagnosis not present

## 2022-12-17 DIAGNOSIS — I5043 Acute on chronic combined systolic (congestive) and diastolic (congestive) heart failure: Secondary | ICD-10-CM | POA: Diagnosis not present

## 2022-12-17 DIAGNOSIS — J9601 Acute respiratory failure with hypoxia: Secondary | ICD-10-CM | POA: Diagnosis not present

## 2022-12-17 LAB — CBC
HCT: 37.7 % (ref 36.0–46.0)
Hemoglobin: 12.1 g/dL (ref 12.0–15.0)
MCH: 31 pg (ref 26.0–34.0)
MCHC: 32.1 g/dL (ref 30.0–36.0)
MCV: 96.7 fL (ref 80.0–100.0)
Platelets: 330 10*3/uL (ref 150–400)
RBC: 3.9 MIL/uL (ref 3.87–5.11)
RDW: 15.6 % — ABNORMAL HIGH (ref 11.5–15.5)
WBC: 12.9 10*3/uL — ABNORMAL HIGH (ref 4.0–10.5)
nRBC: 0 % (ref 0.0–0.2)

## 2022-12-17 LAB — BASIC METABOLIC PANEL
Anion gap: 11 (ref 5–15)
BUN: 14 mg/dL (ref 6–20)
CO2: 26 mmol/L (ref 22–32)
Calcium: 8.9 mg/dL (ref 8.9–10.3)
Chloride: 96 mmol/L — ABNORMAL LOW (ref 98–111)
Creatinine, Ser: 0.66 mg/dL (ref 0.44–1.00)
GFR, Estimated: >60 mL/min (ref >60)
Glucose, Bld: 102 mg/dL — ABNORMAL HIGH (ref 70–99)
Potassium: 4.4 mmol/L (ref 3.5–5.1)
Sodium: 133 mmol/L — ABNORMAL LOW (ref 135–145)

## 2022-12-17 NOTE — Progress Notes (Cosign Needed)
Pulmonary Critical Care Medicine [facility] [place]   PULMONARY CRITICAL CARE SERVICE  PROGRESS NOTE     Cynthia Underwood  RFF:[id number]  DOB: 01-13-1971   DOA: 12/06/2022  Referring Physician: Luna Kitchens, MD  HPI: Cynthia Underwood is a 52 y.o. female being followed for ventilator/airway/oxygen weaning Acute on Chronic Respiratory Failure.  Patient seen sitting in chair, currently on ATC 35%.  Successfully completed 16-hour pressure support goal yesterday and is currently working on 2-hour ATC goal today.  Medications: Reviewed on Rounds  Physical Exam:  Vitals: Temp 97.6, pulse 110, respirations 18, BP 131/75, SpO2 100%  Ventilator Settings ATC 35%  General: Comfortable at this time Neck: supple Cardiovascular: no malignant arrhythmias Respiratory: Bilaterally diminished Skin: no rash seen on limited exam Musculoskeletal: No gross abnormality Psychiatric:unable to assess Neurologic:no involuntary movements         Lab Data:   Basic Metabolic Panel: Recent Labs  Lab 12/13/22 0639 12/13/22 1533 12/16/22 0116 12/16/22 1129 12/17/22 0434  NA 133* 136 136  --  133*  K 4.1 5.0 5.5* 5.0 4.4  CL 98 98 95*  --  96*  CO2 24 25 29   --  26  GLUCOSE 105* 107* 109*  --  102*  BUN 14 13 20   --  14  CREATININE 0.64 0.68 0.67  --  0.66  CALCIUM 9.1 9.1 9.4  --  8.9  MG 2.1 2.3 2.3  --   --     ABG: No results for input(s): "PHART", "PCO2ART", "PO2ART", "HCO3", "O2SAT" in the last 168 hours.  Liver Function Tests: Recent Labs  Lab 12/13/22 1533  AST 46*  ALT 61*  ALKPHOS 109  BILITOT 1.0  PROT 8.3*  ALBUMIN 2.6*   No results for input(s): "LIPASE", "AMYLASE" in the last 168 hours. No results for input(s): "AMMONIA" in the last 168 hours.  CBC: Recent Labs  Lab 12/13/22 0252 12/16/22 0116 12/17/22 0434  WBC 12.9* 8.7 12.9*  HGB 11.7* 12.6 12.1  HCT 36.9 38.6 37.7  MCV 99.7 97.7 96.7  PLT 110* 321 330    Cardiac Enzymes: No  results for input(s): "CKTOTAL", "CKMB", "CKMBINDEX", "TROPONINI" in the last 168 hours.  BNP (last 3 results) Recent Labs    11/11/22 1238 11/17/22 2150  BNP 58.5 145.2*    ProBNP (last 3 results) No results for input(s): "PROBNP" in the last 8760 hours.  Radiological Exams: No results found.  Assessment/Plan Active Problems:   Myotonic dystrophy   Healthcare-associated pneumonia   Acute on chronic combined systolic and diastolic CHF (congestive heart failure)   Acute on chronic respiratory failure with hypoxia   Tracheostomy status   Acute on chronic respiratory failure hypoxia-patient successfully completed 16-hour pressure support goal yesterday and currently working on 2-hour ATC goal today. Chronic heart failure with reduced ejection fraction patient's last ejection fraction was 20% we will continue to monitor fluid status diuresis as tolerated follow labs as closely as possible. Healthcare associated pneumonia patient has been treated with antibiotics at the transferring facility.  Latest chest x-ray shows some atelectasis versus consolidation in the lower lobes Tracheostomy status will continue with routine tracheal care Myotonic dystrophy supportive care and physical therapy to evaluate   I have personally seen and evaluated the patient, evaluated laboratory and imaging results, formulated the assessment and plan and placed orders. The Patient requires high complexity decision making with multiple systems involvement.  Rounds were done with the Respiratory Therapy Director and Staff therapists and discussed  with nursing staff also.  Allyne Gee, MD [facility] Pulmonary Critical Care Medicine Sleep Medicine

## 2022-12-17 NOTE — Progress Notes (Signed)
Pulmonary Critical Care Medicine [facility] [place]   PULMONARY CRITICAL CARE SERVICE  PROGRESS NOTE     Cynthia Underwood  [id number]:[id number]  DOB: [birth year]   DOA: 12/06/2022  Referring Physician: Luna Kitchens, MD  HPI: Cynthia Underwood is a 52 y.o. female being followed for ventilator/airway/oxygen weaning Acute on Chronic Respiratory Failure.  Patient currently is on T collar 35% FiO2  Medications: Reviewed on Rounds  Physical Exam:  Vitals: Temperature 97.5 pulse 100 respiratory 16 blood pressure 96/58 saturation is 98%  Ventilator Settings T collar FiO2 35%  General: Comfortable at this time Neck: supple Cardiovascular: no malignant arrhythmias Respiratory: No rhonchi very coarse breath sounds Skin: no rash seen on limited exam Musculoskeletal: No gross abnormality Psychiatric:unable to assess Neurologic:no involuntary movements         Lab Data:   Basic Metabolic Panel: Recent Labs  Lab 12/13/22 0639 12/13/22 1533 12/16/22 0116 12/16/22 1129 12/17/22 0434  NA 133* 136 136  --  133*  K 4.1 5.0 5.5* 5.0 4.4  CL 98 98 95*  --  96*  CO2 24 25 29   --  26  GLUCOSE 105* 107* 109*  --  102*  BUN 14 13 20   --  14  CREATININE 0.64 0.68 0.67  --  0.66  CALCIUM 9.1 9.1 9.4  --  8.9  MG 2.1 2.3 2.3  --   --     ABG: No results for input(s): "PHART", "PCO2ART", "PO2ART", "HCO3", "O2SAT" in the last 168 hours.  Liver Function Tests: Recent Labs  Lab 12/13/22 1533  AST 46*  ALT 61*  ALKPHOS 109  BILITOT 1.0  PROT 8.3*  ALBUMIN 2.6*   No results for input(s): "LIPASE", "AMYLASE" in the last 168 hours. No results for input(s): "AMMONIA" in the last 168 hours.  CBC: Recent Labs  Lab 12/13/22 0252 12/16/22 0116 12/17/22 0434  WBC 12.9* 8.7 12.9*  HGB 11.7* 12.6 12.1  HCT 36.9 38.6 37.7  MCV 99.7 97.7 96.7  PLT 110* 321 330    Cardiac Enzymes: No results for input(s): "CKTOTAL", "CKMB", "CKMBINDEX", "TROPONINI" in the last 168  hours.  BNP (last 3 results) Recent Labs    11/11/22 1238 11/17/22 2150  BNP 58.5 145.2*    ProBNP (last 3 results) No results for input(s): "PROBNP" in the last 8760 hours.  Radiological Exams: No results found.  Assessment/Plan Active Problems:   Myotonic dystrophy   Healthcare-associated pneumonia   Acute on chronic combined systolic and diastolic CHF (congestive heart failure)   Acute on chronic respiratory failure with hypoxia   Tracheostomy status   Acute on chronic respiratory failure hypoxia will continue with T collar the goal is for 16 hours weaning Myotonic dystrophy no change will continue with supportive care Healthcare associated pneumonia has been treated we will continue to follow along Systolic diastolic heart failure supportive care Tracheostomy remains in place at this time   I have personally seen and evaluated the patient, evaluated laboratory and imaging results, formulated the assessment and plan and placed orders. The Patient requires high complexity decision making with multiple systems involvement.  Rounds were done with the Respiratory Therapy Director and Staff therapists and discussed with nursing staff also.  Yevonne Pax, MD [facility] Pulmonary Critical Care Medicine Sleep Medicine

## 2022-12-17 NOTE — Consult Note (Signed)
Infectious Disease Consultation   Cynthia Underwood  ZOX:[id number]  DOB: [birth year]  DOA: 12/06/2022  Requesting physician: Dr. Manson Passey  Reason for consultation: Antibiotic Recommendations   History of Present Illness: Cynthia Underwood is an 52 y.o. female with history significant for myotonic dystrophy type I, sickle cell trait, high-grade heart block status post pacemaker, tobacco abuse, COPD who was admitted to [facility] on 11/11/2022 when she presented with acute hypoxemic respiratory failure thought to be secondary to community-acquired pneumonia in the setting of metapneumovirus.  Hospital course complicated by V-fib cardiac arrest.  Patient received treatment with IV antibiotics with ceftriaxone/azithromycin.  She was initially on oxygen 5 L nasal cannula.  On 11/14/2022 she suffered a ventricular fibrillation and was found to be pulseless and CODE BLUE was called.  Patient achieved ROSC after 15 minutes of CPR.  She received treatment with amiodarone, epinephrine and defibrillation during the code.  She was intubated and transferred to the ICU service.  She was treated with additional 6 days of antibiotics for healthcare associated pneumonia with ceftriaxone initially and subsequently switched to cefepime.  Sputum cultures that showed normal respiratory flora.  Patient was extubated on 11/17/2022 but unfortunately had to be reintubated on 11/18/2022 due to increased work of breathing and failing BiPAP.  She failed vent wean after reintubation therefore requiring tracheostomy which was placed on 11/27/2022.  Due to her complex medical problems she was transferred and admitted to [facility].  She had respiratory cultures collected on 12/08/2022 that showed abundant Hafnia alvei, moderate Corynebacterium pseudodiphtheriae.  Patient currently has a trach in place on the vent, 35% FiO2.  Review of Systems:  Limited review of systems since the patient currently has a trach,  on vent.  Otherwise negative except as mentioned above in the HPI.  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   Sickle cell trait (HCC)    Watery eyes    Left   Weakness of both legs     Past Surgical History: Past Surgical History:  Procedure Laterality Date   BREAST BIOPSY Right    EYE SURGERY Left 2017   "related to blockage in my nose"   EYE SURGERY Right 05/2019   INSERT / REPLACE / REMOVE PACEMAKER  02/15/2017   PACEMAKER IMPLANT N/A 02/15/2017   Procedure: Pacemaker Implant;  Surgeon: Duke Salvia, MD;  Location: [facility] INVASIVE CV LAB;  Service: Cardiovascular;  Laterality: N/A;   RIGHT/LEFT HEART CATH AND CORONARY ANGIOGRAPHY N/A 11/16/2022   Procedure: RIGHT/LEFT HEART CATH AND CORONARY ANGIOGRAPHY;  Surgeon: Laurey Morale, MD;  Location: [facility] INVASIVE CV LAB;  Service: Cardiovascular;  Laterality: N/A;   THYROIDECTOMY N/A 04/21/2021   Procedure: TOTAL THYROIDECTOMY;  Surgeon: Axel Filler, MD;  Location: [facility] OR;  Service: General;  Laterality: N/A;     Allergies:   Allergies  Allergen Reactions   Penicillin G Other (See Comments), Rash and Nausea And Vomiting    Sore throat   Penicillins Nausea And Vomiting, Rash and Other (See Comments)    Fever Has patient had a PCN reaction causing immediate rash, facial/tongue/throat swelling, SOB or lightheadedness with hypotension: Yes  Has patient had a PCN reaction causing severe rash involving mucus membranes or skin necrosis: Unknown Has patient had a PCN reaction that required hospitalization: No Has patient had a PCN reaction occurring within the last 10 years: No If all of the above answers are "NO", then may proceed with Cephalosporin use.      Social  History:  reports that she has been smoking cigarettes. She has never used smokeless tobacco. She reports that she does not currently use alcohol. She reports that she does not currently use drugs after having used the following drugs: Marijuana.   Family History: Family History  Problem Relation Age of Onset   Cancer Father 31       Deceased   Heart disease Father    COPD Mother    Diabetes Maternal Uncle    Heart disease Maternal Uncle    Heart disease Paternal Grandmother    Diabetes Paternal Grandmother    Hypertension Paternal Grandmother    Neuromuscular disorder Maternal Aunt        Myotonic dystrophy   Neuromuscular disorder Cousin        Myotonic dystrophy   Neuromuscular disorder Sister        Myotonic dystrophy   Colon cancer Neg Hx    Colon polyps Neg Hx    Esophageal cancer Neg Hx    Rectal cancer Neg Hx    Stomach cancer Neg Hx     Physical Exam: Vitals: Temperature 97.5, pulse 100, respiratory rate 16, blood pressure 96/58, oxygen saturation 98%  Constitutional: Ill-appearing female, appears very debilitated, opening eyes Head: Atraumatic, normocephalic Eyes: PERLA, EOMI  ENMT: external ears and nose appear normal, normal hearing, moist oral mucosa  Neck: Has trach in place CVS: S1-S2   Respiratory: Coarse breath sounds with scattered rhonchi Abdomen: soft nontender, nondistended, normal bowel sounds  Musculoskeletal: No edema                       Neuro: Debility with generalized weakness Psych: Not agitated Skin: no rashes    Data reviewed:  I have personally reviewed following labs and imaging studies Labs:  CBC: Recent Labs  Lab 12/13/22 0252 12/16/22 0116 12/17/22 0434  WBC 12.9* 8.7 12.9*  HGB 11.7* 12.6 12.1  HCT 36.9 38.6 37.7  MCV 99.7 97.7 96.7  PLT 110* 321 330    Basic Metabolic Panel: Recent Labs  Lab 12/13/22 0639 12/13/22 1533 12/16/22 0116 12/16/22 1129 12/17/22 0434  NA 133* 136 136  --  133*  K 4.1 5.0 5.5* 5.0 4.4   CL 98 98 95*  --  96*  CO2 24 25 29   --  26  GLUCOSE 105* 107* 109*  --  102*  BUN 14 13 20   --  14  CREATININE 0.64 0.68 0.67  --  0.66  CALCIUM 9.1 9.1 9.4  --  8.9  MG 2.1 2.3 2.3  --   --    GFR Estimated Creatinine Clearance: 51.2 mL/min (by C-G formula based on SCr of 0.66 mg/dL). Liver Function Tests: Recent Labs  Lab 12/13/22 1533  AST 46*  ALT 61*  ALKPHOS 109  BILITOT 1.0  PROT 8.3*  ALBUMIN 2.6*   No results for input(s): "LIPASE", "AMYLASE" in the last 168 hours. No results for input(s): "AMMONIA" in the last 168 hours. Coagulation profile No results for input(s): "INR", "PROTIME" in the last 168 hours.  Cardiac Enzymes: No results for input(s): "CKTOTAL", "CKMB", "CKMBINDEX", "TROPONINI" in  the last 168 hours. BNP: Invalid input(s): "POCBNP" CBG: No results for input(s): "GLUCAP" in the last 168 hours. D-Dimer No results for input(s): "DDIMER" in the last 72 hours. Hgb A1c No results for input(s): "HGBA1C" in the last 72 hours. Lipid Profile No results for input(s): "CHOL", "HDL", "LDLCALC", "TRIG", "CHOLHDL", "LDLDIRECT" in the last 72 hours. Thyroid function studies No results for input(s): "TSH", "T4TOTAL", "T3FREE", "THYROIDAB" in the last 72 hours.  Invalid input(s): "FREET3" Anemia work up No results for input(s): "VITAMINB12", "FOLATE", "FERRITIN", "TIBC", "IRON", "RETICCTPCT" in the last 72 hours. Urinalysis    Component Value Date/Time   COLORURINE AMBER (A) 08/27/2018 2312   APPEARANCEUR CLOUDY (A) 08/27/2018 2312   LABSPEC 1.025 08/27/2018 2312   PHURINE 5.0 08/27/2018 2312   GLUCOSEU NEGATIVE 08/27/2018 2312   HGBUR MODERATE (A) 08/27/2018 2312   BILIRUBINUR moderate (A) 09/03/2018 1138   KETONESUR trace (5) (A) 09/03/2018 1138   KETONESUR 5 (A) 08/27/2018 2312   PROTEINUR 100 (A) 08/27/2018 2312   UROBILINOGEN 1.0 09/03/2018 1138   UROBILINOGEN 1.0 05/16/2014 1619   NITRITE neg 09/03/2018 1138   LEUKOCYTESUR Trace (A) 09/03/2018  1138     Sepsis Labs Recent Labs  Lab 12/13/22 0252 12/16/22 0116 12/17/22 0434  WBC 12.9* 8.7 12.9*   Microbiology Recent Results (from the past 240 hour(s))  Culture, Respiratory w Gram Stain     Status: None   Collection Time: 12/08/22  1:46 PM   Specimen: Tracheal Aspirate; Respiratory  Result Value Ref Range Status   Specimen Description TRACHEAL ASPIRATE  Final   Special Requests NONE  Final   Gram Stain   Final    FEW WBC PRESENT, PREDOMINANTLY PMN FEW GRAM NEGATIVE RODS RARE GRAM POSITIVE COCCI RARE GRAM POSITIVE RODS    Culture   Final    ABUNDANT HAFNIA ALVEI MODERATE CORYNEBACTERIUM PSEUDODIPHTHERIAE Standardized susceptibility testing for this organism is not available. Performed at [facility], [address], [place], [place] [address]    Report Status 12/12/2022 FINAL  Final   Organism ID, Bacteria HAFNIA ALVEI  Final      Susceptibility   Hafnia alvei - MIC*    AMPICILLIN >=32 RESISTANT Resistant     CEFTAZIDIME >=64 RESISTANT Resistant     CEFTRIAXONE >=64 RESISTANT Resistant     CIPROFLOXACIN <=0.25 SENSITIVE Sensitive     GENTAMICIN <=1 SENSITIVE Sensitive     IMIPENEM <=0.25 SENSITIVE Sensitive     TRIMETH/SULFA <=20 SENSITIVE Sensitive     AMPICILLIN/SULBACTAM >=32 RESISTANT Resistant     PIP/TAZO >=128 RESISTANT Resistant     * ABUNDANT HAFNIA ALVEI  Culture, blood (Routine X 2) w Reflex to ID Panel     Status: None   Collection Time: 12/11/22  3:56 PM   Specimen: BLOOD RIGHT HAND  Result Value Ref Range Status   Specimen Description BLOOD RIGHT HAND  Final   Special Requests   Final    BOTTLES DRAWN AEROBIC AND ANAEROBIC Blood Culture results may not be optimal due to an inadequate volume of blood received in culture bottles   Culture   Final    NO GROWTH 5 DAYS Performed at [facility], [address], [place], [place] [address]    Report Status 12/16/2022 FINAL  Final  Culture, blood (Routine X 2) w Reflex to ID Panel      Status: None   Collection Time: 12/11/22  3:58 PM   Specimen: BLOOD LEFT HAND  Result Value Ref  Range Status   Specimen Description BLOOD LEFT HAND  Final   Special Requests   Final    BOTTLES DRAWN AEROBIC AND ANAEROBIC Blood Culture adequate volume   Culture   Final    NO GROWTH 5 DAYS Performed at [facility], [address] [place], [place] [address]    Report Status 12/16/2022 FINAL  Final   Inpatient Medications:   Scheduled Meds: Please see MAR   Radiological Exams on Admission: No results found.  Impression/Recommendations Active Problems: Acute hypoxemic respiratory failure Vent dependent respiratory failure Aspiration pneumonia Leukocytosis Myotonic dystrophy type I Acute on chronic combined systolic and diastolic CHF with decreased EF of 20%  Status post V-fib arrest Tracheostomy status Dysphagia/protein calorie malnutrition  Acute hypoxemic respiratory failure/vent dependence/aspiration pneumonia: Patient noted to have left lower lobe pneumonia at the outside hospital.  She had episode of cardiac arrest with V-fib that required intubation and placement of tracheostomy.  Initially respiratory cultures showed metapneumovirus.  She received treatment with ceftriaxone, azithromycin.  Subsequently treated with IV cefepime at the outside hospital.  Unfortunately patient had episode of vomiting and chest x-ray on 12/08/2022 showed new left lower lobe opacity.  Tracheal aspirate showed Hafnia alvei susceptible to gentamicin, meropenem.  She is currently on treatment with IV meropenem.  Due to elevated WBC count, aspiration event with chest x-ray findings recommend to continue treatment with meropenem.  Will plan to treat for duration of 5 to 7 days pending improvement.  Unfortunately she has chronic trach and dysphagia and is at risk for recurrent acute bronchitis/aspiration pneumonia despite being on antibiotics.  If her respiratory status does not improve, recommend to  check CT of the chest which can be done without contrast if concern for renal compromise.  Monitor CBC, CMP while on antibiotics.  Myotonic dystrophy/dysphagia/protein calorie malnutrition: Unfortunately due to her myotonic dystrophy she is at risk for worsening weakness, recurrent aspiration and aspiration pneumonia despite being on antibiotics.  Continue supportive management per primary team.  Acute on chronic combined systolic/diastolic CHF with reduced ejection fraction of 20%/status post V-fib arrest: Patient went into cardiac arrest with V-fib at the acute facility requiring CPR, amiodarone, epinephrine and defibrillation.  A transthoracic echocardiogram showed biventricular failure with EF less than 20%, severely reduced RV function.  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  She is on digoxin, metoprolol, mexiletine and Aldactone.  Cardiology following.  Further management per primary team and cardiology.  Dysphagia/protein calorie malnutrition: Due to her dysphagia she is at risk for recurrent aspiration and aspiration pneumonia despite being on antibiotics.  Management of PCM per the primary team.  Unfortunately due to her complex medical problems she is at risk for worsening and decompensation.  Plan of care discussed with the primary team and pharmacy.  Thank you for this consultation.   Vonzella Nipple M.D. 12/17/2022, 9:58 PM

## 2022-12-18 DIAGNOSIS — Z930 Tracheostomy status: Secondary | ICD-10-CM | POA: Diagnosis not present

## 2022-12-18 DIAGNOSIS — I5043 Acute on chronic combined systolic (congestive) and diastolic (congestive) heart failure: Secondary | ICD-10-CM | POA: Diagnosis not present

## 2022-12-18 DIAGNOSIS — J9621 Acute and chronic respiratory failure with hypoxia: Secondary | ICD-10-CM | POA: Diagnosis not present

## 2022-12-18 LAB — MAGNESIUM: Magnesium: 2.3 mg/dL (ref 1.7–2.4)

## 2022-12-18 LAB — BASIC METABOLIC PANEL
Anion gap: 12 (ref 5–15)
BUN: 17 mg/dL (ref 6–20)
CO2: 30 mmol/L (ref 22–32)
Calcium: 9.3 mg/dL (ref 8.9–10.3)
Chloride: 94 mmol/L — ABNORMAL LOW (ref 98–111)
Creatinine, Ser: 0.61 mg/dL (ref 0.44–1.00)
GFR, Estimated: >60 mL/min (ref >60)
Glucose, Bld: 87 mg/dL (ref 70–99)
Potassium: 4.6 mmol/L (ref 3.5–5.1)
Sodium: 136 mmol/L (ref 135–145)

## 2022-12-18 LAB — BLOOD GAS, ARTERIAL
Acid-Base Excess: 10.4 mmol/L — ABNORMAL HIGH (ref 0.0–2.0)
Bicarbonate: 35.7 mmol/L — ABNORMAL HIGH (ref 20.0–28.0)
O2 Saturation: 97.4 %
Patient temperature: 36.4
pCO2 arterial: 48 mmHg (ref 32–48)
pH, Arterial: 7.48 — ABNORMAL HIGH (ref 7.35–7.45)
pO2, Arterial: 75 mmHg — ABNORMAL LOW (ref 83–108)

## 2022-12-18 LAB — CBC
HCT: 40.1 % (ref 36.0–46.0)
Hemoglobin: 12.9 g/dL (ref 12.0–15.0)
MCH: 31.5 pg (ref 26.0–34.0)
MCHC: 32.2 g/dL (ref 30.0–36.0)
MCV: 97.8 fL (ref 80.0–100.0)
Platelets: 283 10*3/uL (ref 150–400)
RBC: 4.1 MIL/uL (ref 3.87–5.11)
RDW: 15.6 % — ABNORMAL HIGH (ref 11.5–15.5)
WBC: 7.4 10*3/uL (ref 4.0–10.5)
nRBC: 0 % (ref 0.0–0.2)

## 2022-12-18 NOTE — Progress Notes (Signed)
Pulmonary Critical Care Medicine [facility] [place]   PULMONARY CRITICAL CARE SERVICE  PROGRESS NOTE     Cynthia Underwood  HGD:[id number]  DOB: Jan 25, 1971   DOA: 12/06/2022  Referring Physician: Luna Kitchens, MD  HPI: Cynthia Underwood is a 52 y.o. female being followed for ventilator/airway/oxygen weaning Acute on Chronic Respiratory Failure.  Patient is on T collar currently completing 24 hours goal of 48 hours now  Medications: Reviewed on Rounds  Physical Exam:  Vitals: Temperature is 97.3 pulse of 86 respiratory is 19 blood pressure is 99/66 saturation 97%  Ventilator Settings on T collar  General: Comfortable at this time Neck: supple Cardiovascular: no malignant arrhythmias Skin: no rash seen on limited exam Musculoskeletal: No gross abnormality Psychiatric:unable to assess Neurologic:no involuntary movements         Lab Data:   Basic Metabolic Panel: Recent Labs  Lab 12/13/22 0639 12/13/22 1533 12/16/22 0116 12/16/22 1129 12/17/22 0434 12/18/22 0234  NA 133* 136 136  --  133* 136  K 4.1 5.0 5.5* 5.0 4.4 4.6  CL 98 98 95*  --  96* 94*  CO2 24 25 29   --  26 30  GLUCOSE 105* 107* 109*  --  102* 87  BUN 14 13 20   --  14 17  CREATININE 0.64 0.68 0.67  --  0.66 0.61  CALCIUM 9.1 9.1 9.4  --  8.9 9.3  MG 2.1 2.3 2.3  --   --  2.3    ABG: Recent Labs  Lab 12/18/22 0748  PHART 7.48*  PCO2ART 48  PO2ART 75*  HCO3 35.7*  O2SAT 97.4    Liver Function Tests: Recent Labs  Lab 12/13/22 1533  AST 46*  ALT 61*  ALKPHOS 109  BILITOT 1.0  PROT 8.3*  ALBUMIN 2.6*   No results for input(s): "LIPASE", "AMYLASE" in the last 168 hours. No results for input(s): "AMMONIA" in the last 168 hours.  CBC: Recent Labs  Lab 12/13/22 0252 12/16/22 0116 12/17/22 0434 12/18/22 0234  WBC 12.9* 8.7 12.9* 7.4  HGB 11.7* 12.6 12.1 12.9  HCT 36.9 38.6 37.7 40.1  MCV 99.7 97.7 96.7 97.8  PLT 110* 321 330 283    Cardiac Enzymes: No results  for input(s): "CKTOTAL", "CKMB", "CKMBINDEX", "TROPONINI" in the last 168 hours.  BNP (last 3 results) Recent Labs    11/11/22 1238 11/17/22 2150  BNP 58.5 145.2*    ProBNP (last 3 results) No results for input(s): "PROBNP" in the last 8760 hours.  Radiological Exams: No results found.  Assessment/Plan Active Problems:   Myotonic dystrophy   Healthcare-associated pneumonia   Acute on chronic combined systolic and diastolic CHF (congestive heart failure)   Acute on chronic respiratory failure with hypoxia   Tracheostomy status   Acute on chronic respiratory failure hypoxia will continue with T collar trials goal of 48 hours Myotonic dystrophy no change Healthcare associated pneumonia treated clinically improving Tracheostomy remains in place we will continue to follow Chronic systolic diastolic heart failure compensated   I have personally seen and evaluated the patient, evaluated laboratory and imaging results, formulated the assessment and plan and placed orders. The Patient requires high complexity decision making with multiple systems involvement.  Rounds were done with the Respiratory Therapy Director and Staff therapists and discussed with nursing staff also.  Yevonne Pax, MD [facility] Pulmonary Critical Care Medicine Sleep Medicine

## 2022-12-19 DIAGNOSIS — J9621 Acute and chronic respiratory failure with hypoxia: Secondary | ICD-10-CM | POA: Diagnosis not present

## 2022-12-19 DIAGNOSIS — I5043 Acute on chronic combined systolic (congestive) and diastolic (congestive) heart failure: Secondary | ICD-10-CM | POA: Diagnosis not present

## 2022-12-19 DIAGNOSIS — Z930 Tracheostomy status: Secondary | ICD-10-CM | POA: Diagnosis not present

## 2022-12-19 NOTE — Progress Notes (Signed)
Pulmonary Critical Care Medicine [facility] [place]   PULMONARY CRITICAL CARE SERVICE  PROGRESS NOTE     Cynthia Underwood  IFO:[id number]  DOB: 1970/10/09   DOA: 12/06/2022  Referring Physician: Luna Kitchens, MD  HPI: Cynthia Underwood is a 52 y.o. female being followed for ventilator/airway/oxygen weaning Acute on Chronic Respiratory Failure.  Patient is on T collar currently on 28% FiO2  Medications: Reviewed on Rounds  Physical Exam:  Vitals: Temperature 97.0 pulse of 87 respiratory 24 blood pressure 110/65 saturation 97%  Ventilator Settings on T collar FiO2 is 28%  General: Comfortable at this time Neck: supple Cardiovascular: no malignant arrhythmias Skin: no rash seen on limited exam Musculoskeletal: No gross abnormality Psychiatric:unable to assess Neurologic:no involuntary movements         Lab Data:   Basic Metabolic Panel: Recent Labs  Lab 12/13/22 0639 12/13/22 1533 12/16/22 0116 12/16/22 1129 12/17/22 0434 12/18/22 0234  NA 133* 136 136  --  133* 136  K 4.1 5.0 5.5* 5.0 4.4 4.6  CL 98 98 95*  --  96* 94*  CO2 24 25 29   --  26 30  GLUCOSE 105* 107* 109*  --  102* 87  BUN 14 13 20   --  14 17  CREATININE 0.64 0.68 0.67  --  0.66 0.61  CALCIUM 9.1 9.1 9.4  --  8.9 9.3  MG 2.1 2.3 2.3  --   --  2.3    ABG: Recent Labs  Lab 12/18/22 0748  PHART 7.48*  PCO2ART 48  PO2ART 75*  HCO3 35.7*  O2SAT 97.4    Liver Function Tests: Recent Labs  Lab 12/13/22 1533  AST 46*  ALT 61*  ALKPHOS 109  BILITOT 1.0  PROT 8.3*  ALBUMIN 2.6*   No results for input(s): "LIPASE", "AMYLASE" in the last 168 hours. No results for input(s): "AMMONIA" in the last 168 hours.  CBC: Recent Labs  Lab 12/13/22 0252 12/16/22 0116 12/17/22 0434 12/18/22 0234  WBC 12.9* 8.7 12.9* 7.4  HGB 11.7* 12.6 12.1 12.9  HCT 36.9 38.6 37.7 40.1  MCV 99.7 97.7 96.7 97.8  PLT 110* 321 330 283    Cardiac Enzymes: No results for input(s): "CKTOTAL",  "CKMB", "CKMBINDEX", "TROPONINI" in the last 168 hours.  BNP (last 3 results) Recent Labs    11/11/22 1238 11/17/22 2150  BNP 58.5 145.2*    ProBNP (last 3 results) No results for input(s): "PROBNP" in the last 8760 hours.  Radiological Exams: No results found.  Assessment/Plan Active Problems:   Myotonic dystrophy   Healthcare-associated pneumonia   Acute on chronic combined systolic and diastolic CHF (congestive heart failure)   Acute on chronic respiratory failure with hypoxia   Tracheostomy status   Acute on chronic respiratory failure hypoxia will continue with T collar trials patient is on 28% FiO2 Myotonic dystrophy no change will continue to follow along closely. Healthcare associated pneumonia has been treated Tracheostomy remains in place Combined systolic diastolic heart failure compensated   I have personally seen and evaluated the patient, evaluated laboratory and imaging results, formulated the assessment and plan and placed orders. The Patient requires high complexity decision making with multiple systems involvement.  Rounds were done with the Respiratory Therapy Director and Staff therapists and discussed with nursing staff also.  Yevonne Pax, MD [facility] Pulmonary Critical Care Medicine Sleep Medicine

## 2022-12-20 DIAGNOSIS — I5043 Acute on chronic combined systolic (congestive) and diastolic (congestive) heart failure: Secondary | ICD-10-CM | POA: Diagnosis not present

## 2022-12-20 DIAGNOSIS — Z930 Tracheostomy status: Secondary | ICD-10-CM | POA: Diagnosis not present

## 2022-12-20 DIAGNOSIS — J9621 Acute and chronic respiratory failure with hypoxia: Secondary | ICD-10-CM | POA: Diagnosis not present

## 2022-12-20 NOTE — Progress Notes (Signed)
Pulmonary Critical Care Medicine [facility] [place]   PULMONARY CRITICAL CARE SERVICE  PROGRESS NOTE     Cynthia Underwood  BZJ:[id number]  DOB: [birth year]   DOA: 12/06/2022  Referring Physician: Luna Kitchens, MD  HPI: Cynthia Underwood is a 52 y.o. female being followed for ventilator/airway/oxygen weaning Acute on Chronic Respiratory Failure.  Patient is currently on T collar on 35% FiO2 liberated from the ventilator  Medications: Reviewed on Rounds  Physical Exam:  Vitals: Temperature is 97.6 pulse of 88 respiratory rate is 11 blood pressure 109/64 saturation is 94%  Ventilator Settings off the ventilator on T collar  General: Comfortable at this time Neck: supple Cardiovascular: no malignant arrhythmias Skin: no rash seen on limited exam Musculoskeletal: No gross abnormality Psychiatric:unable to assess Neurologic:no involuntary movements         Lab Data:   Basic Metabolic Panel: Recent Labs  Lab 12/16/22 0116 12/16/22 1129 12/17/22 0434 12/18/22 0234  NA 136  --  133* 136  K 5.5* 5.0 4.4 4.6  CL 95*  --  96* 94*  CO2 29  --  26 30  GLUCOSE 109*  --  102* 87  BUN 20  --  14 17  CREATININE 0.67  --  0.66 0.61  CALCIUM 9.4  --  8.9 9.3  MG 2.3  --   --  2.3    ABG: Recent Labs  Lab 12/18/22 0748  PHART 7.48*  PCO2ART 48  PO2ART 75*  HCO3 35.7*  O2SAT 97.4    Liver Function Tests: No results for input(s): "AST", "ALT", "ALKPHOS", "BILITOT", "PROT", "ALBUMIN" in the last 168 hours. No results for input(s): "LIPASE", "AMYLASE" in the last 168 hours. No results for input(s): "AMMONIA" in the last 168 hours.  CBC: Recent Labs  Lab 12/16/22 0116 12/17/22 0434 12/18/22 0234  WBC 8.7 12.9* 7.4  HGB 12.6 12.1 12.9  HCT 38.6 37.7 40.1  MCV 97.7 96.7 97.8  PLT 321 330 283    Cardiac Enzymes: No results for input(s): "CKTOTAL", "CKMB", "CKMBINDEX", "TROPONINI" in the last 168 hours.  BNP (last 3 results) Recent Labs     11/11/22 1238 11/17/22 2150  BNP 58.5 145.2*    ProBNP (last 3 results) No results for input(s): "PROBNP" in the last 8760 hours.  Radiological Exams: No results found.  Assessment/Plan Active Problems:   Myotonic dystrophy   Healthcare-associated pneumonia   Acute on chronic combined systolic and diastolic CHF (congestive heart failure)   Acute on chronic respiratory failure with hypoxia   Tracheostomy status   Acute on chronic respiratory failure hypoxia patient is doing well on T collar we will go ahead and change the trach out to a cuffless trach today Myotonic dystrophy patient is at baseline we will continue with supportive care Healthcare associated pneumonia has been treated Tracheostomy status will continue with pulmonary toilet change the trach out today Chronic systolic diastolic heart failure compensated   I have personally seen and evaluated the patient, evaluated laboratory and imaging results, formulated the assessment and plan and placed orders. The Patient requires high complexity decision making with multiple systems involvement.  Rounds were done with the Respiratory Therapy Director and Staff therapists and discussed with nursing staff also.  Yevonne Pax, MD [facility] Pulmonary Critical Care Medicine Sleep Medicine

## 2022-12-21 ENCOUNTER — Other Ambulatory Visit (HOSPITAL_COMMUNITY): Payer: 59

## 2022-12-21 DIAGNOSIS — J690 Pneumonitis due to inhalation of food and vomit: Secondary | ICD-10-CM | POA: Diagnosis not present

## 2022-12-21 DIAGNOSIS — R1310 Dysphagia, unspecified: Secondary | ICD-10-CM | POA: Diagnosis not present

## 2022-12-21 DIAGNOSIS — Z4659 Encounter for fitting and adjustment of other gastrointestinal appliance and device: Secondary | ICD-10-CM | POA: Diagnosis not present

## 2022-12-21 DIAGNOSIS — R918 Other nonspecific abnormal finding of lung field: Secondary | ICD-10-CM | POA: Diagnosis not present

## 2022-12-21 DIAGNOSIS — D72829 Elevated white blood cell count, unspecified: Secondary | ICD-10-CM | POA: Diagnosis not present

## 2022-12-21 DIAGNOSIS — J9601 Acute respiratory failure with hypoxia: Secondary | ICD-10-CM | POA: Diagnosis not present

## 2022-12-21 DIAGNOSIS — I5043 Acute on chronic combined systolic (congestive) and diastolic (congestive) heart failure: Secondary | ICD-10-CM | POA: Diagnosis not present

## 2022-12-21 DIAGNOSIS — J9621 Acute and chronic respiratory failure with hypoxia: Secondary | ICD-10-CM | POA: Diagnosis not present

## 2022-12-21 DIAGNOSIS — Z930 Tracheostomy status: Secondary | ICD-10-CM | POA: Diagnosis not present

## 2022-12-21 LAB — BASIC METABOLIC PANEL
Anion gap: 14 (ref 5–15)
BUN: 21 mg/dL — ABNORMAL HIGH (ref 6–20)
CO2: 31 mmol/L (ref 22–32)
Calcium: 9.5 mg/dL (ref 8.9–10.3)
Chloride: 90 mmol/L — ABNORMAL LOW (ref 98–111)
Creatinine, Ser: 0.62 mg/dL (ref 0.44–1.00)
GFR, Estimated: >60 mL/min (ref >60)
Glucose, Bld: 115 mg/dL — ABNORMAL HIGH (ref 70–99)
Potassium: 4.3 mmol/L (ref 3.5–5.1)
Sodium: 135 mmol/L (ref 135–145)

## 2022-12-21 LAB — CBC
HCT: 41.6 % (ref 36.0–46.0)
Hemoglobin: 12.9 g/dL (ref 12.0–15.0)
MCH: 31.2 pg (ref 26.0–34.0)
MCHC: 31 g/dL (ref 30.0–36.0)
MCV: 100.5 fL — ABNORMAL HIGH (ref 80.0–100.0)
Platelets: 322 10*3/uL (ref 150–400)
RBC: 4.14 MIL/uL (ref 3.87–5.11)
RDW: 15.3 % (ref 11.5–15.5)
WBC: 15.8 10*3/uL — ABNORMAL HIGH (ref 4.0–10.5)
nRBC: 0 % (ref 0.0–0.2)

## 2022-12-21 NOTE — Progress Notes (Cosign Needed)
Pulmonary Critical Care Medicine [facility] [place]   PULMONARY CRITICAL CARE SERVICE  PROGRESS NOTE     JOANNY DUPREE  NWG:[id number]  DOB: 1971/02/08   DOA: 12/06/2022  Referring Physician: Luna Kitchens, MD  HPI: BEONCA GIBB is a 52 y.o. female being followed for ventilator/airway/oxygen weaning Acute on Chronic Respiratory Failure.  Patient seen lying in bed, currently on ATC 40%.  Janina Mayo was changed back to cuffed due to vomiting and possible aspirations.  There were bright yellow secretions are suctioned from the airway and her FiO2 needs slightly had increased.  Medications: Reviewed on Rounds  Physical Exam:  Vitals: Temp 96.5, pulse 93, respirations 22, BP 98/67, SpO2 94%  Ventilator Settings ATC 40%  General: Comfortable at this time Neck: supple Cardiovascular: no malignant arrhythmias Respiratory: Bilaterally diminished Skin: no rash seen on limited exam Musculoskeletal: No gross abnormality Psychiatric:unable to assess Neurologic:no involuntary movements         Lab Data:   Basic Metabolic Panel: Recent Labs  Lab 12/16/22 0116 12/16/22 1129 12/17/22 0434 12/18/22 0234 12/21/22 0140  NA 136  --  133* 136 135  K 5.5* 5.0 4.4 4.6 4.3  CL 95*  --  96* 94* 90*  CO2 29  --  GLUCOSE 109*  --  102* 87 115*  BUN 20  --  14 17 21*  CREATININE 0.67  --  0.66 0.61 0.62  CALCIUM 9.4  --  8.9 9.3 9.5  MG 2.3  --   --  2.3  --     ABG: Recent Labs  Lab 12/18/22 0748  PHART 7.48*  PCO2ART 48  PO2ART 75*  HCO3 35.7*  O2SAT 97.4    Liver Function Tests: No results for input(s): "AST", "ALT", "ALKPHOS", "BILITOT", "PROT", "ALBUMIN" in the last 168 hours. No results for input(s): "LIPASE", "AMYLASE" in the last 168 hours. No results for input(s): "AMMONIA" in the last 168 hours.  CBC: Recent Labs  Lab 12/16/22 0116 12/17/22 0434 12/18/22 0234 12/21/22 0140  WBC 8.7 12.9* 7.4 15.8*  HGB 12.6 12.1 12.9 12.9  HCT  38.6 37.7 40.1 41.6  MCV 97.7 96.7 97.8 100.5*  PLT 321 330 283 322    Cardiac Enzymes: No results for input(s): "CKTOTAL", "CKMB", "CKMBINDEX", "TROPONINI" in the last 168 hours.  BNP (last 3 results) Recent Labs    11/11/22 1238 11/17/22 2150  BNP 58.5 145.2*    ProBNP (last 3 results) No results for input(s): "PROBNP" in the last 8760 hours.  Radiological Exams: DG Abd 1 View  Result Date: 12/21/2022 CLINICAL DATA:  Feeding tube placed EXAM: ABDOMEN - 1 VIEW COMPARISON:  12/07/2022 FINDINGS: Feeding tube tip extends to the distal duodenum in the region of the ligament of Treitz. Endotracheal tube projects 2 cm above carina. There is a left-sided pacer. Linear subsegmental atelectasis right base. IMPRESSION: Feeding tube in place. Electronically Signed   By: Layla Maw M.D.   On: 12/21/2022 17:11   DG CHEST PORT 1 VIEW  Result Date: 12/21/2022 CLINICAL DATA:  Status post percutaneous endoscopic gastrostomy tube placement. Evaluation for aspiration/pneumonia. EXAM: PORTABLE CHEST 1 VIEW COMPARISON:  Chest radiograph 12/08/2022 FINDINGS: Tracheostomy tube overlies the airway. Feeding tube courses into the abdomen and is reported on the separate abdominal study. A pacemaker remains in place. The cardiomediastinal silhouette is unchanged with normal heart size. Streaky and patchy bibasilar airspace opacities have increased, particularly on the right with evidence of volume loss. No sizable pleural  effusion or pneumothorax is identified. No acute osseous abnormality is seen. IMPRESSION: Increased bibasilar airspace opacities which may reflect atelectasis, aspiration, or pneumonia. Electronically Signed   By: Sebastian Ache M.D.   On: 12/21/2022 09:57    Assessment/Plan Active Problems:   Myotonic dystrophy   Healthcare-associated pneumonia   Acute on chronic combined systolic and diastolic CHF (congestive heart failure)   Acute on chronic respiratory failure with hypoxia    Tracheostomy status   Acute on chronic respiratory failure hypoxia patient is doing well on T collar.  Continue weaning oxygen down. 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  Continue with trach care per protocol. Chronic systolic diastolic heart failure compensated   I have personally seen and evaluated the patient, evaluated laboratory and imaging results, formulated the assessment and plan and placed orders. The Patient requires high complexity decision making with multiple systems involvement.  Rounds were done with the Respiratory Therapy Director and Staff therapists and discussed with nursing staff also.  Yevonne Pax, MD [facility] Pulmonary Critical Care Medicine Sleep Medicine

## 2022-12-21 NOTE — Progress Notes (Signed)
PROGRESS NOTE    Cynthia Underwood  Cynthia Underwood:[id number] DOB: Jun 01, 1971 DOA: 12/06/2022  Brief Narrative:  Cynthia Underwood is an 52 y.o. female with history significant for myotonic dystrophy type I, sickle cell trait, high-grade heart block status post pacemaker, tobacco abuse, COPD who was admitted to [facility] on 11/11/2022 when she presented with acute hypoxemic respiratory failure thought to be secondary to community-acquired pneumonia in the setting of metapneumovirus.  Hospital course complicated by V-fib cardiac arrest.  Patient received treatment with IV antibiotics with ceftriaxone/azithromycin.  She was initially on oxygen 5 L nasal cannula.  On 11/14/2022 she suffered a ventricular fibrillation and was found to be pulseless and CODE BLUE was called.  Patient achieved ROSC after 15 minutes of CPR.  She received treatment with amiodarone, epinephrine and defibrillation during the code.  She was intubated and transferred to the ICU service.  She was treated with additional 6 days of antibiotics for healthcare associated pneumonia with ceftriaxone initially and subsequently switched to cefepime.  Sputum cultures that showed normal respiratory flora.  Patient was extubated on 11/17/2022 but unfortunately had to be reintubated on 11/18/2022 due to increased work of breathing and failing BiPAP.  She failed vent wean after reintubation therefore requiring tracheostomy which was placed on 11/27/2022.  Due to her complex medical problems she was transferred and admitted to [facility].  She had respiratory cultures collected on 12/08/2022 that showed abundant Hafnia alvei, moderate Corynebacterium pseudodiphtheriae.  She received treatment with IV meropenem.  Patient unfortunately had an episode of vomiting and aspirated and restarted on IV vancomycin, cefepime.  Assessment & Plan:  Active Problems: Acute hypoxemic respiratory failure Recurrent aspiration pneumonia Leukocytosis Myotonic  dystrophy type I Acute on chronic combined systolic and diastolic CHF with decreased EF of 20%  Status post V-fib arrest Dysphagia/protein calorie malnutrition   Acute hypoxemic respiratory failure/vent dependence/aspiration pneumonia: Patient noted to have left lower lobe pneumonia at the outside hospital.  She had episode of cardiac arrest with V-fib that required intubation and placement of tracheostomy.  Initially respiratory cultures showed metapneumovirus.  She received treatment with ceftriaxone, azithromycin.  Subsequently treated with IV cefepime at the outside hospital.  Tracheal aspirate showed Hafnia alvei susceptible to gentamicin, meropenem.  She received treatment with IV meropenem. Unfortunately had episode of vomiting and aspirated again and now restarted on IV vancomycin, cefepime.  Chest x-ray showing bibasilar infiltrates concerning for pneumonia. Repeat respiratory cultures ordered.  Recommend to follow-up on the final cultures and adjust antibiotics accordingly. Unfortunately she has dysphagia and is at risk for recurrent aspiration pneumonia despite being on antibiotics.  If her respiratory status does not improve, recommend to check CT of the chest which can be done without contrast if concern for renal compromise.  Monitor CBC, CMP while on antibiotics.   Myotonic dystrophy/dysphagia/protein calorie malnutrition: Unfortunately due to her myotonic dystrophy she is at risk for worsening weakness, recurrent aspiration and aspiration pneumonia despite being on antibiotics.  Continue supportive management per primary team.   Acute on chronic combined systolic/diastolic CHF with reduced ejection fraction of 20%/status post V-fib arrest: Patient went into cardiac arrest with V-fib at the acute facility requiring CPR, amiodarone, epinephrine and defibrillation.  A transthoracic echocardiogram showed biventricular failure with EF less than 20%, severely reduced RV function.  Cardiac cath  showed nonocclusive coronaries likely nonischemic cardiomyopathy thought to be related to her myotonic dystrophy type I.  She is on digoxin, metoprolol, mexiletine and Aldactone.  Cardiology following.  Further  management per primary team and cardiology.   Dysphagia/protein calorie malnutrition: Due to her dysphagia she is at risk for recurrent aspiration and aspiration pneumonia despite being on antibiotics.  Management of PCM per the primary team.   Unfortunately due to her complex medical problems she is at risk for worsening and decompensation.  Plan of care discussed with the primary team and pharmacy.  Subjective: Patient had vomiting and aspiration event.  Chest x-ray showed increased bibasilar opacities. Repeat cultures ordered.  She has been started on empiric IV vancomycin, cefepime.  Objective: Vitals: Temperature 98.5, pulse 98, respiratory 24, blood pressure 103/69, pulse oximetry 93%  Examination: Constitutional: Ill-appearing female, appears very debilitated, opening eyes Head: Atraumatic, normocephalic Eyes: PERLA, EOMI  ENMT: external ears and nose appear normal, normal hearing, moist oral mucosa  Neck: supple CVS: S1-S2   Respiratory: Coarse breath sounds with scattered rhonchi Abdomen: soft nontender, nondistended, normal bowel sounds  Musculoskeletal: No edema                       Neuro: Debility with generalized weakness Psych: Not agitated Skin: no rashes   Data Reviewed: I have personally reviewed following labs and imaging studies  CBC: Recent Labs  Lab 12/16/22 0116 12/17/22 0434 12/18/22 0234 12/21/22 0140  WBC 8.7 12.9* 7.4 15.8*  HGB 12.6 12.1 12.9 12.9  HCT 38.6 37.7 40.1 41.6  MCV 97.7 96.7 97.8 100.5*  PLT 321 330 283 322    Basic Metabolic Panel: Recent Labs  Lab 12/16/22 0116 12/16/22 1129 12/17/22 0434 12/18/22 0234 12/21/22 0140  NA 136  --  133* 136 135  K 5.5* 5.0 4.4 4.6 4.3  CL 95*  --  96* 94* 90*  CO2 29  --  GLUCOSE 109*  --  102* 87 115*  BUN 20  --  14 17 21*  CREATININE 0.67  --  0.66 0.61 0.62  CALCIUM 9.4  --  8.9 9.3 9.5  MG 2.3  --   --  2.3  --     GFR: CrCl cannot be calculated (Unknown ideal weight.).  Liver Function Tests: No results for input(s): "AST", "ALT", "ALKPHOS", "BILITOT", "PROT", "ALBUMIN" in the last 168 hours.  CBG: No results for input(s): "GLUCAP" in the last 168 hours.   No results found for this or any previous visit (from the past 240 hour(s)).    Radiology Studies: DG Abd 1 View  Result Date: 12/21/2022 CLINICAL DATA:  Feeding tube placed EXAM: ABDOMEN - 1 VIEW COMPARISON:  12/07/2022 FINDINGS: Feeding tube tip extends to the distal duodenum in the region of the ligament of Treitz. Endotracheal tube projects 2 cm above carina. There is a left-sided pacer. Linear subsegmental atelectasis right base. IMPRESSION: Feeding tube in place. Electronically Signed   By: Layla Maw M.D.   On: 12/21/2022 17:11   DG CHEST PORT 1 VIEW  Result Date: 12/21/2022 CLINICAL DATA:  Status post percutaneous endoscopic gastrostomy tube placement. Evaluation for aspiration/pneumonia. EXAM: PORTABLE CHEST 1 VIEW COMPARISON:  Chest radiograph 12/08/2022 FINDINGS: Tracheostomy tube overlies the airway. Feeding tube courses into the abdomen and is reported on the separate abdominal study. A pacemaker remains in place. The cardiomediastinal silhouette is unchanged with normal heart size. Streaky and patchy bibasilar airspace opacities have increased, particularly on the right with evidence of volume loss. No sizable pleural effusion or pneumothorax is identified. No acute osseous abnormality is seen. IMPRESSION: Increased bibasilar airspace opacities which may  reflect atelectasis, aspiration, or pneumonia. Electronically Signed   By: Sebastian Ache M.D.   On: 12/21/2022 09:57     Scheduled Meds: Please see MAR  Vonzella Nipple, MD 12/21/2022, 7:06 PM

## 2022-12-22 DIAGNOSIS — I5043 Acute on chronic combined systolic (congestive) and diastolic (congestive) heart failure: Secondary | ICD-10-CM | POA: Diagnosis not present

## 2022-12-22 DIAGNOSIS — J9621 Acute and chronic respiratory failure with hypoxia: Secondary | ICD-10-CM | POA: Diagnosis not present

## 2022-12-22 DIAGNOSIS — Z930 Tracheostomy status: Secondary | ICD-10-CM | POA: Diagnosis not present

## 2022-12-22 LAB — CBC
HCT: 34.8 % — ABNORMAL LOW (ref 36.0–46.0)
Hemoglobin: 11.5 g/dL — ABNORMAL LOW (ref 12.0–15.0)
MCH: 32 pg (ref 26.0–34.0)
MCHC: 33 g/dL (ref 30.0–36.0)
MCV: 96.9 fL (ref 80.0–100.0)
Platelets: 272 10*3/uL (ref 150–400)
RBC: 3.59 MIL/uL — ABNORMAL LOW (ref 3.87–5.11)
RDW: 15.2 % (ref 11.5–15.5)
WBC: 10.7 10*3/uL — ABNORMAL HIGH (ref 4.0–10.5)
nRBC: 0 % (ref 0.0–0.2)

## 2022-12-22 LAB — BASIC METABOLIC PANEL
Anion gap: 11 (ref 5–15)
BUN: 15 mg/dL (ref 6–20)
CO2: 29 mmol/L (ref 22–32)
Calcium: 9.1 mg/dL (ref 8.9–10.3)
Chloride: 96 mmol/L — ABNORMAL LOW (ref 98–111)
Creatinine, Ser: 0.6 mg/dL (ref 0.44–1.00)
GFR, Estimated: >60 mL/min (ref >60)
Glucose, Bld: 113 mg/dL — ABNORMAL HIGH (ref 70–99)
Potassium: 3.6 mmol/L (ref 3.5–5.1)
Sodium: 136 mmol/L (ref 135–145)

## 2022-12-22 LAB — CULTURE, RESPIRATORY W GRAM STAIN: Gram Stain: NONE SEEN

## 2022-12-22 NOTE — Progress Notes (Incomplete)
Pulmonary Critical Care Medicine [facility] [place]   PULMONARY CRITICAL CARE SERVICE  PROGRESS NOTE     Cynthia Underwood  BJY:[id number]  DOB: [birth year]   DOA: 12/06/2022  Referring Physician: Luna Kitchens, MD  HPI: Cynthia Underwood is a 52 y.o. female being followed for ventilator/airway/oxygen weaning Acute on Chronic Respiratory Failure. ***  Medications: Reviewed on Rounds  Physical Exam:  Vitals: ***  Ventilator Settings ***  General: Comfortable at this time Neck: supple Cardiovascular: no malignant arrhythmias Skin: no rash seen on limited exam Musculoskeletal: No gross abnormality Psychiatric:unable to assess Neurologic:no involuntary movements         Lab Data:   Basic Metabolic Panel: Recent Labs  Lab 12/16/22 0116 12/16/22 1129 12/17/22 0434 12/18/22 0234 12/21/22 0140 12/22/22 0205  NA 136  --  133* 136 135 136  K 5.5* 5.0 4.4 4.6 4.3 3.6  CL 95*  --  96* 94* 90* 96*  CO2 29  --  GLUCOSE 109*  --  102* 87 115* 113*  BUN 20  --  14 17 21* 15  CREATININE 0.67  --  0.66 0.61 0.62 0.60  CALCIUM 9.4  --  8.9 9.3 9.5 9.1  MG 2.3  --   --  2.3  --   --     ABG: Recent Labs  Lab 12/18/22 0748  PHART 7.48*  PCO2ART 48  PO2ART 75*  HCO3 35.7*  O2SAT 97.4    Liver Function Tests: No results for input(s): "AST", "ALT", "ALKPHOS", "BILITOT", "PROT", "ALBUMIN" in the last 168 hours. No results for input(s): "LIPASE", "AMYLASE" in the last 168 hours. No results for input(s): "AMMONIA" in the last 168 hours.  CBC: Recent Labs  Lab 12/16/22 0116 12/17/22 0434 12/18/22 0234 12/21/22 0140 12/22/22 0205  WBC 8.7 12.9* 7.4 15.8* 10.7*  HGB 12.6 12.1 12.9 12.9 11.5*  HCT 38.6 37.7 40.1 41.6 34.8*  MCV 97.7 96.7 97.8 100.5* 96.9  PLT 321 330 283 322 272    Cardiac Enzymes: No results for input(s): "CKTOTAL", "CKMB", "CKMBINDEX", "TROPONINI" in the last 168 hours.  BNP (last 3 results) Recent Labs     11/11/22 1238 11/17/22 2150  BNP 58.5 145.2*    ProBNP (last 3 results) No results for input(s): "PROBNP" in the last 8760 hours.  Radiological Exams: DG Abd 1 View  Result Date: 12/21/2022 CLINICAL DATA:  Feeding tube placed EXAM: ABDOMEN - 1 VIEW COMPARISON:  12/07/2022 FINDINGS: Feeding tube tip extends to the distal duodenum in the region of the ligament of Treitz. Endotracheal tube projects 2 cm above carina. There is a left-sided pacer. Linear subsegmental atelectasis right base. IMPRESSION: Feeding tube in place. Electronically Signed   By: Layla Maw M.D.   On: 12/21/2022 17:11   DG CHEST PORT 1 VIEW  Result Date: 12/21/2022 CLINICAL DATA:  Status post percutaneous endoscopic gastrostomy tube placement. Evaluation for aspiration/pneumonia. EXAM: PORTABLE CHEST 1 VIEW COMPARISON:  Chest radiograph 12/08/2022 FINDINGS: Tracheostomy tube overlies the airway. Feeding tube courses into the abdomen and is reported on the separate abdominal study. A pacemaker remains in place. The cardiomediastinal silhouette is unchanged with normal heart size. Streaky and patchy bibasilar airspace opacities have increased, particularly on the right with evidence of volume loss. No sizable pleural effusion or pneumothorax is identified. No acute osseous abnormality is seen. IMPRESSION: Increased bibasilar airspace opacities which may reflect atelectasis, aspiration, or pneumonia. Electronically Signed   By: Jolaine Click.D.  On: 12/21/2022 09:57    Assessment/Plan Active Problems:   Myotonic dystrophy   Healthcare-associated pneumonia   Acute on chronic combined systolic and diastolic CHF (congestive heart failure)   Acute on chronic respiratory failure with hypoxia   Tracheostomy status   Acute on chronic respiratory failure hypoxia patient is doing well on T collar.  Continue weaning oxygen down. Myotonic dystrophy patient is at baseline we will continue with supportive care Healthcare  associated pneumonia has been treated Tracheostomy status-changed back to a cuffed yesterday.  Continue with trach care per protocol. Chronic systolic diastolic heart failure compensated   I have personally seen and evaluated the patient, evaluated laboratory and imaging results, formulated the assessment and plan and placed orders. The Patient requires high complexity decision making with multiple systems involvement.  Rounds were done with the Respiratory Therapy Director and Staff therapists and discussed with nursing staff also.  Yevonne Pax, MD [facility] Pulmonary Critical Care Medicine Sleep Medicine

## 2022-12-23 DIAGNOSIS — J9621 Acute and chronic respiratory failure with hypoxia: Secondary | ICD-10-CM | POA: Diagnosis not present

## 2022-12-23 DIAGNOSIS — I5043 Acute on chronic combined systolic (congestive) and diastolic (congestive) heart failure: Secondary | ICD-10-CM | POA: Diagnosis not present

## 2022-12-23 DIAGNOSIS — Z930 Tracheostomy status: Secondary | ICD-10-CM | POA: Diagnosis not present

## 2022-12-23 LAB — CBC
HCT: 36.6 % (ref 36.0–46.0)
Hemoglobin: 11.7 g/dL — ABNORMAL LOW (ref 12.0–15.0)
MCH: 31.4 pg (ref 26.0–34.0)
MCHC: 32 g/dL (ref 30.0–36.0)
MCV: 98.1 fL (ref 80.0–100.0)
Platelets: 305 10*3/uL (ref 150–400)
RBC: 3.73 MIL/uL — ABNORMAL LOW (ref 3.87–5.11)
RDW: 15.1 % (ref 11.5–15.5)
WBC: 8.3 10*3/uL (ref 4.0–10.5)
nRBC: 0 % (ref 0.0–0.2)

## 2022-12-23 LAB — CULTURE, RESPIRATORY W GRAM STAIN

## 2022-12-23 LAB — VANCOMYCIN, TROUGH: Vancomycin Tr: 9 ug/mL — ABNORMAL LOW (ref 15–20)

## 2022-12-23 NOTE — Progress Notes (Incomplete)
Pulmonary Critical Care Medicine [facility] [place]   PULMONARY CRITICAL CARE SERVICE  PROGRESS NOTE     KARALINA TIFT  MVH:[id number]  DOB: November 22, 1970   DOA: 12/06/2022  Referring Physician: Luna Kitchens, MD  HPI: JATAVIA KELTNER is a 52 y.o. female being followed for ventilator/airway/oxygen weaning Acute on Chronic Respiratory Failure. ***  Medications: Reviewed on Rounds  Physical Exam:  Vitals: ***  Ventilator Settings ***  General: Comfortable at this time Neck: supple Cardiovascular: no malignant arrhythmias Skin: no rash seen on limited exam Musculoskeletal: No gross abnormality Psychiatric:unable to assess Neurologic:no involuntary movements         Lab Data:   Basic Metabolic Panel: Recent Labs  Lab 12/17/22 0434 12/18/22 0234 12/21/22 0140 12/22/22 0205  NA 133* 136 135 136  K 4.4 4.6 4.3 3.6  CL 96* 94* 90* 96*  CO2 GLUCOSE 102* 87 115* 113*  BUN 14 17 21* 15  CREATININE 0.66 0.61 0.62 0.60  CALCIUM 8.9 9.3 9.5 9.1  MG  --  2.3  --   --     ABG: Recent Labs  Lab 12/18/22 0748  PHART 7.48*  PCO2ART 48  PO2ART 75*  HCO3 35.7*  O2SAT 97.4     Liver Function Tests: No results for input(s): "AST", "ALT", "ALKPHOS", "BILITOT", "PROT", "ALBUMIN" in the last 168 hours. No results for input(s): "LIPASE", "AMYLASE" in the last 168 hours. No results for input(s): "AMMONIA" in the last 168 hours.  CBC: Recent Labs  Lab 12/17/22 0434 12/18/22 0234 12/21/22 0140 12/22/22 0205 12/23/22 0037  WBC 12.9* 7.4 15.8* 10.7* 8.3  HGB 12.1 12.9 12.9 11.5* 11.7*  HCT 37.7 40.1 41.6 34.8* 36.6  MCV 96.7 97.8 100.5* 96.9 98.1  PLT 330 283 322 272 305     Cardiac Enzymes: No results for input(s): "CKTOTAL", "CKMB", "CKMBINDEX", "TROPONINI" in the last 168 hours.  BNP (last 3 results) Recent Labs    11/11/22 1238 11/17/22 2150  BNP 58.5 145.2*     ProBNP (last 3 results) No results for input(s):  "PROBNP" in the last 8760 hours.  Radiological Exams: DG Abd 1 View  Result Date: 12/21/2022 CLINICAL DATA:  Feeding tube placed EXAM: ABDOMEN - 1 VIEW COMPARISON:  12/07/2022 FINDINGS: Feeding tube tip extends to the distal duodenum in the region of the ligament of Treitz. Endotracheal tube projects 2 cm above carina. There is a left-sided pacer. Linear subsegmental atelectasis right base. IMPRESSION: Feeding tube in place. Electronically Signed   By: Layla Maw M.D.   On: 12/21/2022 17:11    Assessment/Plan Active Problems:   Myotonic dystrophy   Healthcare-associated pneumonia   Acute on chronic combined systolic and diastolic CHF (congestive heart failure)   Acute on chronic respiratory failure with hypoxia   Tracheostomy status   Acute on chronic respiratory failure hypoxia patient is doing well on T collar.  Continue weaning oxygen down. 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  Continue with trach care per protocol. Chronic systolic diastolic heart failure compensated   I have personally seen and evaluated the patient, evaluated laboratory and imaging results, formulated the assessment and plan and placed orders. The Patient requires high complexity decision making with multiple systems involvement.  Rounds were done with the Respiratory Therapy Director and Staff therapists and discussed with nursing staff also.  Yevonne Pax, MD [facility]  Pulmonary Critical Care Medicine Sleep Medicine

## 2022-12-24 DIAGNOSIS — G7111 Myotonic muscular dystrophy: Secondary | ICD-10-CM | POA: Diagnosis not present

## 2022-12-24 DIAGNOSIS — J9621 Acute and chronic respiratory failure with hypoxia: Secondary | ICD-10-CM | POA: Diagnosis not present

## 2022-12-24 DIAGNOSIS — J189 Pneumonia, unspecified organism: Secondary | ICD-10-CM | POA: Diagnosis not present

## 2022-12-24 DIAGNOSIS — I5043 Acute on chronic combined systolic (congestive) and diastolic (congestive) heart failure: Secondary | ICD-10-CM | POA: Diagnosis not present

## 2022-12-24 LAB — CULTURE, RESPIRATORY W GRAM STAIN

## 2022-12-25 DIAGNOSIS — J189 Pneumonia, unspecified organism: Secondary | ICD-10-CM | POA: Diagnosis not present

## 2022-12-25 DIAGNOSIS — G7111 Myotonic muscular dystrophy: Secondary | ICD-10-CM | POA: Diagnosis not present

## 2022-12-25 DIAGNOSIS — J9621 Acute and chronic respiratory failure with hypoxia: Secondary | ICD-10-CM | POA: Diagnosis not present

## 2022-12-25 DIAGNOSIS — I5043 Acute on chronic combined systolic (congestive) and diastolic (congestive) heart failure: Secondary | ICD-10-CM | POA: Diagnosis not present

## 2022-12-25 LAB — CBC
HCT: 37.8 % (ref 36.0–46.0)
Hemoglobin: 11.6 g/dL — ABNORMAL LOW (ref 12.0–15.0)
MCH: 30.8 pg (ref 26.0–34.0)
MCHC: 30.7 g/dL (ref 30.0–36.0)
MCV: 100.3 fL — ABNORMAL HIGH (ref 80.0–100.0)
Platelets: 330 10*3/uL (ref 150–400)
RBC: 3.77 MIL/uL — ABNORMAL LOW (ref 3.87–5.11)
RDW: 15.2 % (ref 11.5–15.5)
WBC: 6.2 10*3/uL (ref 4.0–10.5)
nRBC: 0 % (ref 0.0–0.2)

## 2022-12-25 LAB — BASIC METABOLIC PANEL
Anion gap: 7 (ref 5–15)
BUN: 10 mg/dL (ref 6–20)
CO2: 31 mmol/L (ref 22–32)
Calcium: 8.9 mg/dL (ref 8.9–10.3)
Chloride: 101 mmol/L (ref 98–111)
Creatinine, Ser: 0.47 mg/dL (ref 0.44–1.00)
GFR, Estimated: >60 mL/min (ref >60)
Glucose, Bld: 105 mg/dL — ABNORMAL HIGH (ref 70–99)
Potassium: 4.3 mmol/L (ref 3.5–5.1)
Sodium: 139 mmol/L (ref 135–145)

## 2022-12-25 NOTE — Progress Notes (Addendum)
Pulmonary Critical Care Medicine [facility] [place]   PULMONARY CRITICAL CARE SERVICE  PROGRESS NOTE     Cynthia Underwood  MRN:[id number]  DOB: 02/14/1971   DOA: 12/06/2022  Referring Physician: Stephanie Brown, MD  HPI: Cynthia Underwood is a 52 y.o. female being followed for ventilator/airway/oxygen weaning Acute on Chronic Respiratory Failure. ***  Medications: Reviewed on Rounds  Physical Exam:  Vitals: ***  Ventilator Settings ***  General: Comfortable at this time Neck: supple Cardiovascular: no malignant arrhythmias Skin: no rash seen on limited exam Musculoskeletal: No gross abnormality Psychiatric:unable to assess Neurologic:no involuntary movements         Lab Data:   Basic Metabolic Panel: Recent Labs  Lab 12/21/22 0140 12/22/22 0205 12/25/22 0158  NA 135 136 139  K 4.3 3.6 4.3  CL 90* 96* 101  CO2 31 29 31  GLUCOSE 115* 113* 105*  BUN 21* 15 10  CREATININE 0.62 0.60 0.47  CALCIUM 9.5 9.1 8.9     ABG: No results for input(s): "PHART", "PCO2ART", "PO2ART", "HCO3", "O2SAT" in the last 168 hours.  Liver Function Tests: No results for input(s): "AST", "ALT", "ALKPHOS", "BILITOT", "PROT", "ALBUMIN" in the last 168 hours. No results for input(s): "LIPASE", "AMYLASE" in the last 168 hours. No results for input(s): "AMMONIA" in the last 168 hours.  CBC: Recent Labs  Lab 12/21/22 0140 12/22/22 0205 12/23/22 0037 12/25/22 0158  WBC 15.8* 10.7* 8.3 6.2  HGB 12.9 11.5* 11.7* 11.6*  HCT 41.6 34.8* 36.6 37.8  MCV 100.5* 96.9 98.1 100.3*  PLT 322 272 305 330     Cardiac Enzymes: No results for input(s): "CKTOTAL", "CKMB", "CKMBINDEX", "TROPONINI" in the last 168 hours.  BNP (last 3 results) Recent Labs    11/11/22 1238 11/17/22 2150  BNP 58.5 145.2*     ProBNP (last 3 results) No results for input(s): "PROBNP" in the last 8760 hours.  Radiological Exams: No results found.  Assessment/Plan Active Problems:    Myotonic dystrophy   Healthcare-associated pneumonia   Acute on chronic combined systolic and diastolic CHF (congestive heart failure)   Acute on chronic respiratory failure with hypoxia   Tracheostomy status   Acute on chronic respiratory failure hypoxia patient is doing well on T collar.  Continue weaning oxygen down. 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  Continue with trach care per protocol. Chronic systolic diastolic heart failure compensated   I have personally seen and evaluated the patient, evaluated laboratory and imaging results, formulated the assessment and plan and placed orders. The Patient requires high complexity decision making with multiple systems involvement.  Rounds were done with the Respiratory Therapy Director and Staff therapists and discussed with nursing staff also.  Shirell Struthers A Cebastian Neis, MD FCCP Pulmonary Critical Care Medicine Sleep Medicine  

## 2022-12-25 NOTE — Progress Notes (Addendum)
Pulmonary Critical Care Medicine [facility] [place]   PULMONARY CRITICAL CARE SERVICE  PROGRESS NOTE     Cynthia Underwood  EXB:[id number]  DOB: 1971-07-05   DOA: 12/06/2022  Referring Physician: Luna Kitchens, MD  HPI: Cynthia Underwood is a 52 y.o. female being followed for ventilator/airway/oxygen weaning Acute on Chronic Respiratory Failure. ***  Medications: Reviewed on Rounds  Physical Exam:  Vitals: ***  Ventilator Settings ***  General: Comfortable at this time Neck: supple Cardiovascular: no malignant arrhythmias Skin: no rash seen on limited exam Musculoskeletal: No gross abnormality Psychiatric:unable to assess Neurologic:no involuntary movements         Lab Data:   Basic Metabolic Panel: Recent Labs  Lab 12/21/22 0140 12/22/22 0205 12/25/22 0158  NA 135 136 139  K 4.3 3.6 4.3  CL 90* 96* 101  CO2 GLUCOSE 115* 113* 105*  BUN 21* 15 10  CREATININE 0.62 0.60 0.47  CALCIUM 9.5 9.1 8.9    ABG: No results for input(s): "PHART", "PCO2ART", "PO2ART", "HCO3", "O2SAT" in the last 168 hours.  Liver Function Tests: No results for input(s): "AST", "ALT", "ALKPHOS", "BILITOT", "PROT", "ALBUMIN" in the last 168 hours. No results for input(s): "LIPASE", "AMYLASE" in the last 168 hours. No results for input(s): "AMMONIA" in the last 168 hours.  CBC: Recent Labs  Lab 12/21/22 0140 12/22/22 0205 12/23/22 0037 12/25/22 0158  WBC 15.8* 10.7* 8.3 6.2  HGB 12.9 11.5* 11.7* 11.6*  HCT 41.6 34.8* 36.6 37.8  MCV 100.5* 96.9 98.1 100.3*  PLT 322 272 305 330    Cardiac Enzymes: No results for input(s): "CKTOTAL", "CKMB", "CKMBINDEX", "TROPONINI" in the last 168 hours.  BNP (last 3 results) Recent Labs    11/11/22 1238 11/17/22 2150  BNP 58.5 145.2*    ProBNP (last 3 results) No results for input(s): "PROBNP" in the last 8760 hours.  Radiological Exams: No results found.  Assessment/Plan Active Problems:   Myotonic  dystrophy   Healthcare-associated pneumonia   Acute on chronic combined systolic and diastolic CHF (congestive heart failure)   Acute on chronic respiratory failure with hypoxia   Tracheostomy status   Acute on chronic respiratory failure hypoxia patient is doing well on T collar.  Continue weaning oxygen down. 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  Continue with trach care per protocol. Chronic systolic diastolic heart failure compensated   I have personally seen and evaluated the patient, evaluated laboratory and imaging results, formulated the assessment and plan and placed orders. The Patient requires high complexity decision making with multiple systems involvement.  Rounds were done with the Respiratory Therapy Director and Staff therapists and discussed with nursing staff also.  Yevonne Pax, MD [facility] Pulmonary Critical Care Medicine Sleep Medicine

## 2022-12-26 ENCOUNTER — Other Ambulatory Visit (HOSPITAL_COMMUNITY): Payer: 59

## 2022-12-26 DIAGNOSIS — J9811 Atelectasis: Secondary | ICD-10-CM | POA: Diagnosis not present

## 2022-12-26 DIAGNOSIS — N281 Cyst of kidney, acquired: Secondary | ICD-10-CM | POA: Diagnosis not present

## 2022-12-26 DIAGNOSIS — K8020 Calculus of gallbladder without cholecystitis without obstruction: Secondary | ICD-10-CM | POA: Diagnosis not present

## 2022-12-26 DIAGNOSIS — J189 Pneumonia, unspecified organism: Secondary | ICD-10-CM | POA: Diagnosis not present

## 2022-12-26 DIAGNOSIS — G7111 Myotonic muscular dystrophy: Secondary | ICD-10-CM | POA: Diagnosis not present

## 2022-12-26 DIAGNOSIS — I5043 Acute on chronic combined systolic (congestive) and diastolic (congestive) heart failure: Secondary | ICD-10-CM | POA: Diagnosis not present

## 2022-12-26 DIAGNOSIS — J9621 Acute and chronic respiratory failure with hypoxia: Secondary | ICD-10-CM | POA: Diagnosis not present

## 2022-12-26 NOTE — Progress Notes (Addendum)
Pulmonary Critical Care Medicine [facility] [place]   PULMONARY CRITICAL CARE SERVICE  PROGRESS NOTE     Cynthia Underwood  ZOX:[id number]  DOB: 1971-03-12   DOA: 12/06/2022  Referring Physician: Luna Kitchens, MD  HPI: Cynthia Underwood is a 52 y.o. female being followed for ventilator/airway/oxygen weaning Acute on Chronic Respiratory Failure.  Patient seen lying in bed, currently capped on 2 L nasal cannula, just completed 24-hour capped ABG was good.  Will move forward with the 48-hour capping goal.  Unfortunately did fail swallow study today.  Medications: Reviewed on Rounds  Physical Exam:  Vitals: Temp 97.8, pulse 92, respirations 16, BP 110/59, SpO2 96%  Ventilator Settings capped on 2 L nasal cannula  General: Comfortable at this time Neck: supple Cardiovascular: no malignant arrhythmias Respiratory: Bilaterally diminished Skin: no rash seen on limited exam Musculoskeletal: No gross abnormality Psychiatric:unable to assess Neurologic:no involuntary movements         Lab Data:   Basic Metabolic Panel: Recent Labs  Lab 12/21/22 0140 12/22/22 0205 12/25/22 0158  NA 135 136 139  K 4.3 3.6 4.3  CL 90* 96* 101  CO2 31 29 31   GLUCOSE 115* 113* 105*  BUN 21* 15 10  CREATININE 0.62 0.60 0.47  CALCIUM 9.5 9.1 8.9     ABG: No results for input(s): "PHART", "PCO2ART", "PO2ART", "HCO3", "O2SAT" in the last 168 hours.  Liver Function Tests: No results for input(s): "AST", "ALT", "ALKPHOS", "BILITOT", "PROT", "ALBUMIN" in the last 168 hours. No results for input(s): "LIPASE", "AMYLASE" in the last 168 hours. No results for input(s): "AMMONIA" in the last 168 hours.  CBC: Recent Labs  Lab 12/21/22 0140 12/22/22 0205 12/23/22 0037 12/25/22 0158  WBC 15.8* 10.7* 8.3 6.2  HGB 12.9 11.5* 11.7* 11.6*  HCT 41.6 34.8* 36.6 37.8  MCV 100.5* 96.9 98.1 100.3*  PLT 322 272 305 330     Cardiac Enzymes: No results for input(s): "CKTOTAL",  "CKMB", "CKMBINDEX", "TROPONINI" in the last 168 hours.  BNP (last 3 results) Recent Labs    11/11/22 1238 11/17/22 2150  BNP 58.5 145.2*     ProBNP (last 3 results) No results for input(s): "PROBNP" in the last 8760 hours.  Radiological Exams: No results found.  Assessment/Plan Active Problems:   Myotonic dystrophy   Healthcare-associated pneumonia   Acute on chronic combined systolic and diastolic CHF (congestive heart failure)   Acute on chronic respiratory failure with hypoxia   Tracheostomy status   Acute on chronic respiratory failure hypoxia-patient currently just completed 24 hours of caffeine and work towards 48-hour And goal. 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  Continue with trach care per protocol.  Remains stable and in place Chronic systolic diastolic heart failure compensated   I have personally seen and evaluated the patient, evaluated laboratory and imaging results, formulated the assessment and plan and placed orders. The Patient requires high complexity decision making with multiple systems involvement.  Rounds were done with the Respiratory Therapy Director and Staff therapists and discussed with nursing staff also.  Yevonne Pax, MD [facility] Pulmonary Critical Care Medicine Sleep Medicine

## 2022-12-27 DIAGNOSIS — I5043 Acute on chronic combined systolic (congestive) and diastolic (congestive) heart failure: Secondary | ICD-10-CM | POA: Diagnosis not present

## 2022-12-27 DIAGNOSIS — J189 Pneumonia, unspecified organism: Secondary | ICD-10-CM | POA: Diagnosis not present

## 2022-12-27 DIAGNOSIS — J9621 Acute and chronic respiratory failure with hypoxia: Secondary | ICD-10-CM | POA: Diagnosis not present

## 2022-12-27 DIAGNOSIS — G7111 Myotonic muscular dystrophy: Secondary | ICD-10-CM | POA: Diagnosis not present

## 2022-12-27 LAB — CBC
HCT: 38.8 % (ref 36.0–46.0)
Hemoglobin: 12.4 g/dL (ref 12.0–15.0)
MCH: 31.3 pg (ref 26.0–34.0)
MCHC: 32 g/dL (ref 30.0–36.0)
MCV: 98 fL (ref 80.0–100.0)
Platelets: 367 10*3/uL (ref 150–400)
RBC: 3.96 MIL/uL (ref 3.87–5.11)
RDW: 15.1 % (ref 11.5–15.5)
WBC: 21.9 10*3/uL — ABNORMAL HIGH (ref 4.0–10.5)
nRBC: 0 % (ref 0.0–0.2)

## 2022-12-27 LAB — BASIC METABOLIC PANEL
Anion gap: 11 (ref 5–15)
BUN: 19 mg/dL (ref 6–20)
CO2: 29 mmol/L (ref 22–32)
Calcium: 9.2 mg/dL (ref 8.9–10.3)
Chloride: 98 mmol/L (ref 98–111)
Creatinine, Ser: 0.66 mg/dL (ref 0.44–1.00)
GFR, Estimated: >60 mL/min (ref >60)
Glucose, Bld: 143 mg/dL — ABNORMAL HIGH (ref 70–99)
Potassium: 4.6 mmol/L (ref 3.5–5.1)
Sodium: 138 mmol/L (ref 135–145)

## 2022-12-28 ENCOUNTER — Other Ambulatory Visit (HOSPITAL_COMMUNITY): Payer: 59

## 2022-12-28 DIAGNOSIS — Z4659 Encounter for fitting and adjustment of other gastrointestinal appliance and device: Secondary | ICD-10-CM | POA: Diagnosis not present

## 2022-12-28 DIAGNOSIS — R918 Other nonspecific abnormal finding of lung field: Secondary | ICD-10-CM | POA: Diagnosis not present

## 2022-12-28 DIAGNOSIS — J9811 Atelectasis: Secondary | ICD-10-CM | POA: Diagnosis not present

## 2022-12-28 LAB — CULTURE, RESPIRATORY W GRAM STAIN

## 2022-12-28 NOTE — Progress Notes (Addendum)
Pulmonary Critical Care Medicine [facility] [place]   PULMONARY CRITICAL CARE SERVICE  PROGRESS NOTE     Cynthia Underwood  [id number]  DOB: 1970-11-28   DOA: 12/06/2022  Referring Physician: Luna Kitchens, MD  HPI: Cynthia Underwood is a 52 y.o. female being followed for ventilator/airway/oxygen weaning Acute on Chronic Respiratory Failure.  Patient seen sitting in chair, currently on 50% ATC.  Has required anywhere from 40 to 60% ATC.  We will continue capping trials as patient tolerates. Medications: Reviewed on Rounds  Physical Exam:  Vitals: Temp 102.3, pulse 102, respirations 21, BP 21, BP 96/57, SpO2 100%  Ventilator Settings ATC 50%  General: Comfortable at this time Neck: supple Cardiovascular: no malignant arrhythmias Respiratory: Bilaterally diminished Skin: no rash seen on limited exam Musculoskeletal: No gross abnormality Psychiatric:unable to assess Neurologic:no involuntary movements         Lab Data:   Basic Metabolic Panel: Recent Labs  Lab 12/21/22 0140 12/22/22 0205 12/25/22 0158 12/27/22 0951  NA 135 136 139 138  K 4.3 3.6 4.3 4.6  CL 90* 96* 101 98  CO2 GLUCOSE 115* 113* 105* 143*  BUN 21* CREATININE 0.62 0.60 0.47 0.66  CALCIUM 9.5 9.1 8.9 9.2     ABG: No results for input(s): "PHART", "PCO2ART", "PO2ART", "HCO3", "O2SAT" in the last 168 hours.  Liver Function Tests: No results for input(s): "AST", "ALT", "ALKPHOS", "BILITOT", "PROT", "ALBUMIN" in the last 168 hours. No results for input(s): "LIPASE", "AMYLASE" in the last 168 hours. No results for input(s): "AMMONIA" in the last 168 hours.  CBC: Recent Labs  Lab 12/21/22 0140 12/22/22 0205 12/23/22 0037 12/25/22 0158 12/27/22 0951  WBC 15.8* 10.7* 8.3 6.2 21.9*  HGB 12.9 11.5* 11.7* 11.6* 12.4  HCT 41.6 34.8* 36.6 37.8 38.8  MCV 100.5* 96.9 98.1 100.3* 98.0  PLT 322 272 305 330 367     Cardiac Enzymes: No results for  input(s): "CKTOTAL", "CKMB", "CKMBINDEX", "TROPONINI" in the last 168 hours.  BNP (last 3 results) Recent Labs    11/11/22 1238 11/17/22 2150  BNP 58.5 145.2*     ProBNP (last 3 results) No results for input(s): "PROBNP" in the last 8760 hours.  Radiological Exams: CT ABDOMEN PELVIS WO CONTRAST  Result Date: 12/26/2022 CLINICAL DATA:  Peg tube placement planning EXAM: CT ABDOMEN AND PELVIS WITHOUT CONTRAST TECHNIQUE: Multidetector CT imaging of the abdomen and pelvis was performed following the standard protocol without IV contrast. RADIATION DOSE REDUCTION: This exam was performed according to the departmental dose-optimization program which includes automated exposure control, adjustment of the mA and/or kV according to patient size and/or use of iterative reconstruction technique. COMPARISON:  02/20/2018 FINDINGS: Lower chest: Substantial airspace opacities and bronchiectasis in both lower lobes with associated volume loss. There is also atelectasis in the right middle lobe cylindrical bronchiectasis. Dual lead pacer noted. A tube in the esophagus appears to terminate in the distal esophagus. Hepatobiliary: Dependent density in the gallbladder favoring gallstones. Pancreas: Unremarkable Spleen: Unremarkable Adrenals/Urinary Tract: Normal hypodense renal cysts require no further workup. Urinary bladder unremarkable. Stomach/Bowel: Contrast medium is present in the colon and distal small bowel as well as the appendix. Currently there is little contrast medium in the stomach fundus. No gastric distention or obvious gastric wall abnormality. Vascular/Lymphatic: Unremarkable Reproductive: Unremarkable Other: No supplemental non-categorized findings. Musculoskeletal: Chronic right pars defect at L5. IMPRESSION: 1. A tube in the esophagus appears to terminate in the distal esophagus.  2. Substantial airspace opacities and bronchiectasis in both lower lobes with associated volume loss. There is also  atelectasis in the right middle lobe. 3. Cholelithiasis. 4. Chronic right pars defect at L5. Electronically Signed   By: Gaylyn Rong M.D.   On: 12/26/2022 17:18   DG Chest 1 View  Result Date: 12/26/2022 CLINICAL DATA:  Sickle trait, atelectasis EXAM: CHEST  1 VIEW COMPARISON:  Portable exam 1340 hours compared to 12/21/2022 FINDINGS: Tracheostomy tube tube projects over tracheal air column. Tip of feeding tube projects over esophagus distally, recommend advancing tube 13 cm to place at mid stomach. LEFT subclavian pacemaker leads noted. Enlargement of cardiac silhouette. Mediastinal contours and pulmonary vascularity normal. Bibasilar atelectasis greater on RIGHT, chronic. Upper lungs clear. No pleural effusion or pneumothorax. IMPRESSION: Mild persistent bibasilar atelectasis greater on RIGHT. Tip of feeding tube projects over distal esophagus, recommend advancing tube 13 cm to place tip at the mid stomach. Electronically Signed   By: Ulyses Southward M.D.   On: 12/26/2022 13:31    Assessment/Plan Active Problems:   Myotonic dystrophy   Healthcare-associated pneumonia   Acute on chronic combined systolic and diastolic CHF (congestive heart failure)   Acute on chronic respiratory failure with hypoxia   Tracheostomy status   Acute on chronic respiratory failure hypoxia-patient had just completed almost 48 hours of capping but then required being placed back on ATC.  We will continue capping trials as patient tolerates. 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  Continue with trach care per protocol.  Remains stable and in place Chronic systolic diastolic heart failure compensated   I have personally seen and evaluated the patient, evaluated laboratory and imaging results, formulated the assessment and plan and placed orders. The Patient requires high  complexity decision making with multiple systems involvement.  Rounds were done with the Respiratory Therapy Director and Staff therapists and discussed with nursing staff also.  Yevonne Pax, MD [facility] Pulmonary Critical Care Medicine Sleep Medicine

## 2022-12-28 NOTE — Consult Note (Signed)
Chief Complaint: Patient was seen in consultation today for percutaneous gastric tube placement at the request of Dr Theora Gianotti  Supervising Physician: Pernell Dupre  Patient Status: Select IP  History of Present Illness: Cynthia Underwood is a 52 y.o. female   Respiratory failure; pneumonia Myotonic dystrophy CHF Protein calorie malnutrition Vent/trach Deconditioning Failure to thrive Need for long term care  Request made for percutaneous gastric tube placement Radiologist reviewed imaging and approved procedure  Recent temp of 102.3 this am; wbc 21.9 yesterday   Past Medical History:  Diagnosis Date   Bifascicular block 10/02/2014   Cataract    Dyspnea    Excess ear wax    Multinodular goiter    Pneumonia    Presence of permanent cardiac pacemaker    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   Sickle cell trait (HCC)    Watery eyes    Left   Weakness of both legs     Past Surgical History:  Procedure Laterality Date   BREAST BIOPSY Right    EYE SURGERY Left 2017   "related to blockage in my nose"   EYE SURGERY Right 05/2019   INSERT / REPLACE / REMOVE PACEMAKER  02/15/2017   PACEMAKER IMPLANT N/A 02/15/2017   Procedure: Pacemaker Implant;  Surgeon: Duke Salvia, MD;  Location: [facility] INVASIVE CV LAB;  Service: Cardiovascular;  Laterality: N/A;   RIGHT/LEFT HEART CATH AND CORONARY ANGIOGRAPHY N/A 11/16/2022   Procedure: RIGHT/LEFT HEART CATH AND CORONARY ANGIOGRAPHY;  Surgeon: Laurey Morale, MD;  Location: [facility] INVASIVE CV LAB;  Service: Cardiovascular;  Laterality: N/A;   THYROIDECTOMY N/A 04/21/2021   Procedure: TOTAL THYROIDECTOMY;  Surgeon: Axel Filler, MD;  Location: [facility] OR;  Service: General;  Laterality: N/A;    Allergies: Penicillin g and Penicillins  Medications: Prior to Admission medications   Medication Sig Start Date End Date Taking? Authorizing Provider  clonazePAM (KLONOPIN) 1 MG tablet Place 1  tablet (1 mg total) into feeding tube every 8 (eight) hours. 12/06/22   Doran Stabler, DO  levothyroxine (SYNTHROID) 112 MCG tablet Place 1 tablet (112 mcg total) into feeding tube daily before breakfast. 12/07/22   Doran Stabler, DO  Nutritional Supplements (FEEDING SUPPLEMENT, OSMOLITE 1.5 CAL,) LIQD Place 1,000 mLs into feeding tube continuous. 12/06/22   Doran Stabler, DO  pantoprazole (PROTONIX) 40 MG injection Inject 40 mg into the vein at bedtime. 12/06/22   Doran Stabler, DO  Protein (FEEDING SUPPLEMENT, PROSOURCE TF20,) liquid Place 60 mLs into feeding tube daily. 12/07/22   Doran Stabler, DO  QUEtiapine (SEROQUEL) 25 MG tablet Place 1 tablet (25 mg total) into feeding tube at bedtime. 12/06/22   Doran Stabler, DO  sertraline (ZOLOFT) 25 MG tablet Place 3 tablets (75 mg total) into feeding tube daily. 12/07/22   Doran Stabler, DO  spironolactone (ALDACTONE) 25 MG tablet Place 0.5 tablets (12.5 mg total) into feeding tube daily. 12/07/22   Doran Stabler, DO     Family History  Problem Relation Age of Onset   Cancer Father 44       Deceased   Heart disease Father    COPD Mother    Diabetes Maternal Uncle    Heart disease Maternal Uncle    Heart disease Paternal Grandmother    Diabetes Paternal Grandmother    Hypertension Paternal Grandmother    Neuromuscular disorder Maternal Aunt        Myotonic dystrophy   Neuromuscular disorder Cousin  Myotonic dystrophy   Neuromuscular disorder Sister        Myotonic dystrophy   Colon cancer Neg Hx    Colon polyps Neg Hx    Esophageal cancer Neg Hx    Rectal cancer Neg Hx    Stomach cancer Neg Hx     Social History   Socioeconomic History   Marital status: Single    Spouse name: Not on file   Number of children: 0   Years of education: 1.5 Collge   Highest education level: Not on file  Occupational History   Occupation: Unemployed    Employer: OTHER  Tobacco Use   Smoking status: Every Day    Years: 28    Types: Cigarettes    Last attempt to  quit: 10/04/2016    Years since quitting: 6.2   Smokeless tobacco: Never   Tobacco comments:    3 cigarettes a day  Vaping Use   Vaping Use: Never used  Substance and Sexual Activity   Alcohol use: Not Currently    Alcohol/week: 0.0 standard drinks of alcohol    Comment:  few times a year   Drug use: Not Currently    Types: Marijuana    Comment: occ    Sexual activity: Not Currently    Birth control/protection: None, Condom  Other Topics Concern   Not on file  Social History Narrative   Patient lives at home with her aunt.   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  She moved to [facility] in August 2015.   She has been on disability since 2010.   Education: 1 1/2 years of college.   Caffeine - coke 2 cans/day   Exercise - no   Social Determinants of Health   Financial Resource Strain: Low Risk  (10/16/2022)   Overall Financial Resource Strain (CARDIA)    Difficulty of Paying Living Expenses: Not hard at all  Food Insecurity: No Food Insecurity (10/16/2022)   Hunger Vital Sign    Worried About Running Out of Food in the Last Year: Never true    Ran Out of Food in the Last Year: Never true  Transportation Needs: No Transportation Needs (10/16/2022)   PRAPARE - Administrator, Civil Service (Medical): No    Lack of Transportation (Non-Medical): No  Physical Activity: Inactive (10/16/2022)   Exercise Vital Sign    Days of Exercise per Week: 0 days    Minutes of Exercise per Session: 0 min  Stress: Stress Concern Present (10/16/2022)   [employer] of Occupational Health - Occupational Stress Questionnaire    Feeling of Stress : To some extent  Social Connections: Socially Isolated (10/16/2022)   Social Connection and Isolation Panel [NHANES]    Frequency of Communication with Friends and Family: Three times a week    Frequency of Social Gatherings with Friends and Family: Three times a week    Attends Religious Services: Never    Active Member  of Clubs or Organizations: No    Attends Banker Meetings: Never    Marital Status: Never married    Review of Systems: A 12 point ROS discussed and pertinent positives are indicated in the HPI above.  All other systems are negative.  Vital Signs: LMP 12/03/2016 (Approximate)     Physical Exam Vitals reviewed.  Constitutional:      Appearance: She is ill-appearing.  HENT:     Mouth/Throat:     Mouth: Mucous membranes are moist.  Cardiovascular:     Rate and Rhythm: Normal rate and regular rhythm.     Heart sounds: Normal heart sounds.  Pulmonary:     Effort: Pulmonary effort is normal.     Breath sounds: Rhonchi present.  Abdominal:     Palpations: Abdomen is soft.  Musculoskeletal:        General: Normal range of motion.  Skin:    General: Skin is warm.  Neurological:     Mental Status: She is alert.     Comments: Discussed procedure with pt and mother at bedside They agree to consent for procedure     Imaging: DG Abd Portable 1V  Result Date: 12/28/2022 CLINICAL DATA:  Nasogastric tube placement. EXAM: PORTABLE ABDOMEN - 1 VIEW COMPARISON:  CT 12/26/2022.  Radiographs 12/06/2022. FINDINGS: 1048 hours. A feeding tube is in place with its tip overlying the L3 vertebral body, likely in the distal stomach. No nasogastric tube is visualized. Pacemaker leads appear unchanged. The visualized bowel gas pattern is normal. There are increased streaky opacities at both lung bases, right greater left, compared with prior radiographs, similar to more recent CT. There may be a small amount of pleural fluid on the right. IMPRESSION: 1. Feeding tube tip overlies the distal stomach. No nasogastric tube visualized. 2. Increased bibasilar pulmonary opacities, similar to recent CT. Electronically Signed   By: Carey Bullocks M.D.   On: 12/28/2022 11:14   CT ABDOMEN PELVIS WO CONTRAST  Result Date: 12/26/2022 CLINICAL DATA:  Peg tube placement planning EXAM: CT ABDOMEN AND  PELVIS WITHOUT CONTRAST TECHNIQUE: Multidetector CT imaging of the abdomen and pelvis was performed following the standard protocol without IV contrast. RADIATION DOSE REDUCTION: This exam was performed according to the departmental dose-optimization program which includes automated exposure control, adjustment of the mA and/or kV according to patient size and/or use of iterative reconstruction technique. COMPARISON:  02/20/2018 FINDINGS: Lower chest: Substantial airspace opacities and bronchiectasis in both lower lobes with associated volume loss. There is also atelectasis in the right middle lobe cylindrical bronchiectasis. Dual lead pacer noted. A tube in the esophagus appears to terminate in the distal esophagus. Hepatobiliary: Dependent density in the gallbladder favoring gallstones. Pancreas: Unremarkable Spleen: Unremarkable Adrenals/Urinary Tract: Normal hypodense renal cysts require no further workup. Urinary bladder unremarkable. Stomach/Bowel: Contrast medium is present in the colon and distal small bowel as well as the appendix. Currently there is little contrast medium in the stomach fundus. No gastric distention or obvious gastric wall abnormality. Vascular/Lymphatic: Unremarkable Reproductive: Unremarkable Other: No supplemental non-categorized findings. Musculoskeletal: Chronic right pars defect at L5. IMPRESSION: 1. A tube in the esophagus appears to terminate in the distal esophagus. 2. Substantial airspace opacities and bronchiectasis in both lower lobes with associated volume loss. There is also atelectasis in the right middle lobe. 3. Cholelithiasis. 4. Chronic right pars defect at L5. Electronically Signed   By: Gaylyn Rong M.D.   On: 12/26/2022 17:18   DG Chest 1 View  Result Date: 12/26/2022 CLINICAL DATA:  Sickle trait, atelectasis EXAM: CHEST  1 VIEW COMPARISON:  Portable exam 1340 hours compared to 12/21/2022 FINDINGS: Tracheostomy tube tube projects over tracheal air column.  Tip of feeding tube projects over esophagus distally, recommend advancing tube 13 cm to place at mid stomach. LEFT subclavian pacemaker leads noted. Enlargement of cardiac silhouette. Mediastinal contours and pulmonary vascularity normal. Bibasilar atelectasis greater on RIGHT, chronic. Upper lungs clear. No pleural effusion or pneumothorax. IMPRESSION: Mild persistent bibasilar atelectasis greater on RIGHT. Tip of  feeding tube projects over distal esophagus, recommend advancing tube 13 cm to place tip at the mid stomach. Electronically Signed   By: Ulyses Southward M.D.   On: 12/26/2022 13:31   DG Abd 1 View  Result Date: 12/21/2022 CLINICAL DATA:  Feeding tube placed EXAM: ABDOMEN - 1 VIEW COMPARISON:  12/07/2022 FINDINGS: Feeding tube tip extends to the distal duodenum in the region of the ligament of Treitz. Endotracheal tube projects 2 cm above carina. There is a left-sided pacer. Linear subsegmental atelectasis right base. IMPRESSION: Feeding tube in place. Electronically Signed   By: Layla Maw M.D.   On: 12/21/2022 17:11   DG CHEST PORT 1 VIEW  Result Date: 12/21/2022 CLINICAL DATA:  Status post percutaneous endoscopic gastrostomy tube placement. Evaluation for aspiration/pneumonia. EXAM: PORTABLE CHEST 1 VIEW COMPARISON:  Chest radiograph 12/08/2022 FINDINGS: Tracheostomy tube overlies the airway. Feeding tube courses into the abdomen and is reported on the separate abdominal study. A pacemaker remains in place. The cardiomediastinal silhouette is unchanged with normal heart size. Streaky and patchy bibasilar airspace opacities have increased, particularly on the right with evidence of volume loss. No sizable pleural effusion or pneumothorax is identified. No acute osseous abnormality is seen. IMPRESSION: Increased bibasilar airspace opacities which may reflect atelectasis, aspiration, or pneumonia. Electronically Signed   By: Sebastian Ache M.D.   On: 12/21/2022 09:57   DG CHEST PORT 1  VIEW  Result Date: 12/08/2022 CLINICAL DATA:  Nasogastric tube placement.  Concern for aspiration. EXAM: PORTABLE CHEST 1 VIEW COMPARISON:  Radiographs 12/06/2022 and CT 11/11/2022. FINDINGS: 1125 hours. Feeding tube tip projects over the L2-3 disc space, consistent with position in the distal stomach. Tracheostomy and left subclavian pacemaker leads appear unchanged. The heart size and mediastinal contours are stable. Patchy bibasilar pulmonary opacities have mildly worsened on the left in the interval. No pneumothorax or significant pleural effusion. Surgical clips from previous thyroidectomy. Numerous telemetry leads overlie the chest. IMPRESSION: Feeding tube tip projects over the distal stomach. Increased patchy left basilar pulmonary opacity which may reflect atelectasis or aspiration. Electronically Signed   By: Carey Bullocks M.D.   On: 12/08/2022 11:43   DG Abd 1 View  Result Date: 12/07/2022 CLINICAL DATA:  Feeding tube placement EXAM: ABDOMEN - 1 VIEW COMPARISON:  12/06/2022 FINDINGS: A feeding tube extends down the esophagus and terminates in the stomach body. Stable Cardiac pacer leads noted. The lung bases appear clear. Unremarkable bowel gas pattern. Spina bifida occulta and right pars defect at L5. IMPRESSION: 1. Feeding tube tip is in the stomach body. 2. Spina bifida occulta and chronic right pars defect at L5. Electronically Signed   By: Gaylyn Rong M.D.   On: 12/07/2022 12:19   DG Abd Portable 1V  Result Date: 12/06/2022 CLINICAL DATA:  Pneumonia. Respiratory failure. Nasogastric tube placement. EXAM: PORTABLE ABDOMEN - 1 VIEW COMPARISON:  Radiographs 12/06/2022 and 11/15/2022.  CT 02/20/2018. FINDINGS: 1728 hours. Feeding tube tip projects over the L2 vertebral body, likely in the distal stomach. The visualized bowel gas pattern appears nonobstructive. No acute findings are identified. IMPRESSION: Feeding tube tip likely in the distal stomach. Electronically Signed   By: Carey Bullocks M.D.   On: 12/06/2022 17:43   DG CHEST PORT 1 VIEW  Result Date: 12/06/2022 CLINICAL DATA:  Pneumonia. Respiratory failure. Nasogastric tube placement. EXAM: PORTABLE CHEST 1 VIEW COMPARISON:  Radiographs earlier the same date and 11/30/2022. CT 11/11/2022. FINDINGS: 1727 hours. Tracheostomy and left subclavian pacemaker leads appear unchanged. Interval placement  of a feeding tube which projects below the diaphragm, tip overlying the L2 vertebral body, likely in the distal stomach. The heart size and mediastinal contours are stable. There is interval improved aeration of both lung bases. No pleural effusion or pneumothorax. Stable postsurgical changes from previous thyroidectomy. IMPRESSION: Interval feeding tube placement with tip likely in the distal stomach. Improved aeration of the lung bases. Electronically Signed   By: Carey Bullocks M.D.   On: 12/06/2022 17:42   DG Abd Portable 1V  Result Date: 12/06/2022 CLINICAL DATA:  Enteric tube placement. EXAM: PORTABLE ABDOMEN - 1 VIEW COMPARISON:  Abdominal x-ray dated December 02, 2022. FINDINGS: The feeding tube is been advanced since the prior study, with tip now in the gastric antrum. IMPRESSION: 1. Feeding tube tip in the gastric antrum. Electronically Signed   By: Obie Dredge M.D.   On: 12/06/2022 11:37   DG CHEST PORT 1 VIEW  Result Date: 12/06/2022 CLINICAL DATA:  Shortness of breath EXAM: PORTABLE CHEST 1 VIEW COMPARISON:  Portable exam 0028 hours compared to 11/30/2022 FINDINGS: Tracheostomy tube projects over tracheal air column. LEFT subclavian sequential transvenous pacemaker leads project at RIGHT atrium and RIGHT ventricle. Normal heart size, mediastinal contours, and pulmonary vascularity. Atelectasis versus consolidation BILATERAL lower lobes increased from previous exam. Upper lungs clear. No pleural effusion or pneumothorax. IMPRESSION: Increased atelectasis versus consolidation BILATERAL lower lobes. Electronically Signed   By:  Ulyses Southward M.D.   On: 12/06/2022 08:35   DG Abd 1 View  Result Date: 12/02/2022 CLINICAL DATA:  NG tube placement EXAM: ABDOMEN - 1 VIEW COMPARISON:  12/01/2022 FINDINGS: Feeding tube tip is in the stomach, unchanged since prior study. IMPRESSION: Feeding tube tip in the stomach. Electronically Signed   By: Charlett Nose M.D.   On: 12/02/2022 01:08   DG Abd 1 View  Result Date: 12/01/2022 CLINICAL DATA:  [id number] Encounter for imaging study to confirm nasogastric (NG) tube placement [id number]. EXAM: ABDOMEN - 1 VIEW COMPARISON:  11/15/2022. FINDINGS: Feeding tube tip projects over the stomach. Normal bowel-gas pattern. Visualized portions of the lung bases are unremarkable. IMPRESSION: Feeding tube tip projects over the stomach. Electronically Signed   By: Orvan Falconer M.D.   On: 12/01/2022 11:18   DG Chest Port 1 View  Result Date: 11/30/2022 CLINICAL DATA:  Acute respiratory failure EXAM: PORTABLE CHEST 1 VIEW COMPARISON:  Chest x-ray dated November 27, 2022 FINDINGS: Tracheostomy tube feeding tube and left IJ line are unchanged in position. Left chest wall dual lead pacer with unchanged lead position. Cardiac and mediastinal contours are unchanged. Unchanged consolidation of the right lower lung, likely a combination right middle lobe atelectasis and consolidation. No large pleural effusion. No evidence of pneumothorax. IMPRESSION: Unchanged consolidation of the right lower lung. Electronically Signed   By: Allegra Lai M.D.   On: 11/30/2022 08:09    Labs:  CBC: Recent Labs    12/22/22 0205 12/23/22 0037 12/25/22 0158 12/27/22 0951  WBC 10.7* 8.3 6.2 21.9*  HGB 11.5* 11.7* 11.6* 12.4  HCT 34.8* 36.6 37.8 38.8  PLT 272 305 330 367    COAGS: Recent Labs    11/14/22 2000 11/18/22 0556  INR 1.5* 1.4*    BMP: Recent Labs    12/21/22 0140 12/22/22 0205 12/25/22 0158 12/27/22 0951  NA 135 136 139 138  K 4.3 3.6 4.3 4.6  CL 90* 96* 101 98  CO2 31 29 31 29   GLUCOSE 115* 113*  105* 143*  BUN 21* 15  10 19  CALCIUM 9.5 9.1 8.9 9.2  CREATININE 0.62 0.60 0.47 0.66  GFRNONAA >60 >60 >60 >60    LIVER FUNCTION TESTS: Recent Labs    11/18/22 0556 11/20/22 0421 11/21/22 0112 12/13/22 1533  BILITOT 0.5 0.3 0.6 1.0  AST 20 21 29  46*  ALT 60* 34 35 61*  ALKPHOS 37* 53 60 109  PROT 5.5* 6.0* 6.4* 8.3*  ALBUMIN 2.4* 2.1* 2.2* 2.6*    TUMOR MARKERS: No results for input(s): "AFPTM", "CEA", "CA199", "CHROMGRNA" in the last 8760 hours.  Assessment and Plan:  Scheduled for percutaneous gastric tube placement when appropriate Wbc 21.9; temp 102 We will monitor wbc and temp When wnl--- we can move forward.  Risks and benefits image guided gastrostomy tube placement was discussed with the patient and her mother at bedside including, but not limited to the need for a barium enema during the procedure, bleeding, infection, peritonitis and/or damage to adjacent structures.  All of the patient's questions were answered, patient is agreeable to proceed. Consent signed and in chart.  Thank you for this interesting consult.  I greatly enjoyed meeting HAROLYN COCKER and look forward to participating in their care.  A copy of this report was sent to the requesting provider on this date.  Electronically Signed: Robet Leu, PA-C 12/28/2022, 1:47 PM   I spent a total of 40 Minutes    in face to face in clinical consultation, greater than 50% of which was counseling/coordinating care for percutaneous gastric tube placement

## 2022-12-29 ENCOUNTER — Other Ambulatory Visit (HOSPITAL_COMMUNITY): Payer: 59

## 2022-12-29 DIAGNOSIS — J9621 Acute and chronic respiratory failure with hypoxia: Secondary | ICD-10-CM | POA: Diagnosis not present

## 2022-12-29 DIAGNOSIS — I5043 Acute on chronic combined systolic (congestive) and diastolic (congestive) heart failure: Secondary | ICD-10-CM | POA: Diagnosis not present

## 2022-12-29 DIAGNOSIS — Z4659 Encounter for fitting and adjustment of other gastrointestinal appliance and device: Secondary | ICD-10-CM | POA: Diagnosis not present

## 2022-12-29 DIAGNOSIS — J189 Pneumonia, unspecified organism: Secondary | ICD-10-CM | POA: Diagnosis not present

## 2022-12-29 DIAGNOSIS — G7111 Myotonic muscular dystrophy: Secondary | ICD-10-CM | POA: Diagnosis not present

## 2022-12-29 LAB — BASIC METABOLIC PANEL
Anion gap: 9 (ref 5–15)
BUN: 11 mg/dL (ref 6–20)
CO2: 32 mmol/L (ref 22–32)
Calcium: 9.2 mg/dL (ref 8.9–10.3)
Chloride: 97 mmol/L — ABNORMAL LOW (ref 98–111)
Creatinine, Ser: 0.47 mg/dL (ref 0.44–1.00)
GFR, Estimated: >60 mL/min (ref >60)
Glucose, Bld: 122 mg/dL — ABNORMAL HIGH (ref 70–99)
Potassium: 4.5 mmol/L (ref 3.5–5.1)
Sodium: 138 mmol/L (ref 135–145)

## 2022-12-29 LAB — CBC
HCT: 35.2 % — ABNORMAL LOW (ref 36.0–46.0)
Hemoglobin: 11.3 g/dL — ABNORMAL LOW (ref 12.0–15.0)
MCH: 31.5 pg (ref 26.0–34.0)
MCHC: 32.1 g/dL (ref 30.0–36.0)
MCV: 98.1 fL (ref 80.0–100.0)
Platelets: 352 K/uL (ref 150–400)
RBC: 3.59 MIL/uL — ABNORMAL LOW (ref 3.87–5.11)
RDW: 15.3 % (ref 11.5–15.5)
WBC: 10.9 K/uL — ABNORMAL HIGH (ref 4.0–10.5)
nRBC: 0 % (ref 0.0–0.2)

## 2022-12-29 NOTE — Progress Notes (Incomplete)
Pulmonary Critical Care Medicine [facility]   PULMONARY CRITICAL CARE SERVICE  PROGRESS NOTE     Cynthia Underwood  WUJ:[id number]  DOB: [birth year]   DOA: 12/06/2022  Referring Physician: Luna Kitchens, MD  HPI: Cynthia Underwood is a 52 y.o. female being followed for ventilator/airway/oxygen weaning Acute on Chronic Respiratory Failure. ***  Medications: Reviewed on Rounds  Physical Exam:  Vitals: ***  Ventilator Settings ***  General: Comfortable at this time Neck: supple Cardiovascular: no malignant arrhythmias Skin: no rash seen on limited exam Musculoskeletal: No gross abnormality Psychiatric:unable to assess Neurologic:no involuntary movements         Lab Data:   Basic Metabolic Panel: Recent Labs  Lab 12/25/22 0158 12/27/22 0951 12/29/22 0240  NA 139 138 138  K 4.3 4.6 4.5  CL 101 98 97*  CO2 31 29 32  GLUCOSE 105* 143* 122*  BUN 10 19 11   CREATININE 0.47 0.66 0.47  CALCIUM 8.9 9.2 9.2    ABG: No results for input(s): "PHART", "PCO2ART", "PO2ART", "HCO3", "O2SAT" in the last 168 hours.  Liver Function Tests: No results for input(s): "AST", "ALT", "ALKPHOS", "BILITOT", "PROT", "ALBUMIN" in the last 168 hours. No results for input(s): "LIPASE", "AMYLASE" in the last 168 hours. No results for input(s): "AMMONIA" in the last 168 hours.  CBC: Recent Labs  Lab 12/23/22 0037 12/25/22 0158 12/27/22 0951 12/29/22 0240  WBC 8.3 6.2 21.9* 10.9*  HGB 11.7* 11.6* 12.4 11.3*  HCT 36.6 37.8 38.8 35.2*  MCV 98.1 100.3* 98.0 98.1  PLT 305 330 367 352    Cardiac Enzymes: No results for input(s): "CKTOTAL", "CKMB", "CKMBINDEX", "TROPONINI" in the last 168 hours.  BNP (last 3 results) Recent Labs    11/11/22 1238 11/17/22 2150  BNP 58.5 145.2*    ProBNP (last 3 results) No results for input(s): "PROBNP" in the last 8760 hours.  Radiological Exams: DG CHEST PORT 1 VIEW  Result Date: 12/28/2022 CLINICAL DATA:   Pneumonia. EXAM: PORTABLE CHEST 1 VIEW COMPARISON:  December 26, 2022. FINDINGS: Stable cardiomediastinal silhouette. Feeding tube is seen entering stomach. Tracheostomy tube is unchanged. Left-sided pacemaker is unchanged. Minimal left basilar subsegmental atelectasis is noted. Right basilar opacity is noted concerning for pneumonia or atelectasis. Bony thorax is unremarkable. IMPRESSION: Right basilar pneumonia or atelectasis is noted. Minimal left basilar subsegmental atelectasis. Stable support apparatus. Electronically Signed   By: Lupita Raider M.D.   On: 12/28/2022 17:07   DG Abd Portable 1V  Result Date: 12/28/2022 CLINICAL DATA:  Nasogastric tube placement. EXAM: PORTABLE ABDOMEN - 1 VIEW COMPARISON:  CT 12/26/2022.  Radiographs 12/06/2022. FINDINGS: 1048 hours. A feeding tube is in place with its tip overlying the L3 vertebral body, likely in the distal stomach. No nasogastric tube is visualized. Pacemaker leads appear unchanged. The visualized bowel gas pattern is normal. There are increased streaky opacities at both lung bases, right greater left, compared with prior radiographs, similar to more recent CT. There may be a small amount of pleural fluid on the right. IMPRESSION: 1. Feeding tube tip overlies the distal stomach. No nasogastric tube visualized. 2. Increased bibasilar pulmonary opacities, similar to recent CT. Electronically Signed   By: Carey Bullocks M.D.   On: 12/28/2022 11:14    Assessment/Plan Active Problems:   Myotonic dystrophy (HCC)   Healthcare-associated pneumonia   Acute on chronic combined systolic and diastolic CHF (congestive heart failure) (HCC)   Acute on chronic respiratory failure with hypoxia (HCC)   Tracheostomy status (  HCC)   *** *** *** *** ***   I have personally seen and evaluated the patient, evaluated laboratory and imaging results, formulated the assessment and plan and placed orders. The Patient requires high complexity decision making with  multiple systems involvement.  Rounds were done with the Respiratory Therapy Director and Staff therapists and discussed with nursing staff also.  Yevonne Pax, MD [facility] Pulmonary Critical Care Medicine Sleep Medicine

## 2022-12-30 DIAGNOSIS — I5043 Acute on chronic combined systolic (congestive) and diastolic (congestive) heart failure: Secondary | ICD-10-CM | POA: Diagnosis not present

## 2022-12-30 DIAGNOSIS — J9621 Acute and chronic respiratory failure with hypoxia: Secondary | ICD-10-CM | POA: Diagnosis not present

## 2022-12-30 DIAGNOSIS — J189 Pneumonia, unspecified organism: Secondary | ICD-10-CM | POA: Diagnosis not present

## 2022-12-30 DIAGNOSIS — G7111 Myotonic muscular dystrophy: Secondary | ICD-10-CM | POA: Diagnosis not present

## 2022-12-30 NOTE — Progress Notes (Cosign Needed)
Pulmonary Critical Care Medicine [facility] [place]   PULMONARY CRITICAL CARE SERVICE  PROGRESS NOTE     Cynthia Underwood  WUJ:[id number]  DOB: [birth year]   DOA: 12/06/2022  Referring Physician: Luna Kitchens, MD  HPI: Cynthia Underwood is a 52 y.o. female being followed for ventilator/airway/oxygen weaning Acute on Chronic Respiratory Failure.  Patient seen sitting lying in bed, currently on ATC 40%.  Will continue PMV use as patient tolerates.  Medications: Reviewed on Rounds  Physical Exam:  Vitals: Temp 97.9, pulse 95, respirations 17, BP 102/59, SpO2 97%  Ventilator Settings ATC 40%  General: Comfortable at this time Neck: supple Cardiovascular: no malignant arrhythmias Respiratory: Bilaterally diminished Skin: no rash seen on limited exam Musculoskeletal: No gross abnormality Psychiatric:unable to assess Neurologic:no involuntary movements         Lab Data:   Basic Metabolic Panel: Recent Labs  Lab 12/25/22 0158 12/27/22 0951 12/29/22 0240  NA 139 138 138  K 4.3 4.6 4.5  CL 101 98 97*  CO2 31 29 32  GLUCOSE 105* 143* 122*  BUN 10 19 11   CREATININE 0.47 0.66 0.47  CALCIUM 8.9 9.2 9.2     ABG: No results for input(s): "PHART", "PCO2ART", "PO2ART", "HCO3", "O2SAT" in the last 168 hours.  Liver Function Tests: No results for input(s): "AST", "ALT", "ALKPHOS", "BILITOT", "PROT", "ALBUMIN" in the last 168 hours. No results for input(s): "LIPASE", "AMYLASE" in the last 168 hours. No results for input(s): "AMMONIA" in the last 168 hours.  CBC: Recent Labs  Lab 12/25/22 0158 12/27/22 0951 12/29/22 0240  WBC 6.2 21.9* 10.9*  HGB 11.6* 12.4 11.3*  HCT 37.8 38.8 35.2*  MCV 100.3* 98.0 98.1  PLT 330 367 352     Cardiac Enzymes: No results for input(s): "CKTOTAL", "CKMB", "CKMBINDEX", "TROPONINI" in the last 168 hours.  BNP (last 3 results) Recent Labs    11/11/22 1238 11/17/22 2150  BNP 58.5 145.2*     ProBNP (last 3  results) No results for input(s): "PROBNP" in the last 8760 hours.  Radiological Exams: DG Abd 1 View  Result Date: 12/29/2022 CLINICAL DATA:  [id number] Nasogastric tube present [id number] EXAM: ABDOMEN - 1 VIEW COMPARISON:  X-ray abdomen 12/28/2022 FINDINGS: Tracheostomy tube terminating 3.5 cm above the carina. Enteric tube coursing below the hemidiaphragm with tip and side port overlying the gastric lumen. Tube is noted to be kinked within the stomach with at least an additional 15 cm of tubing within the gastric lumen. Left chest wall dual lead pacemaker in grossly similar position. Multiple lines and tubes overlie the chest and abdomen. The bowel gas pattern is normal. No radio-opaque calculi or other significant radiographic abnormality are seen. IMPRESSION: Enteric tube coursing below the hemidiaphragm with tip and side port overlying the gastric lumen. Tube kinked within the stomach with at least an additional 15 cm of tubing within the gastric lumen. Consider retraction. Electronically Signed   By: Tish Frederickson M.D.   On: 12/29/2022 17:03    Assessment/Plan Active Problems:   Myotonic dystrophy (HCC)   Healthcare-associated pneumonia   Acute on chronic combined systolic and diastolic CHF (congestive heart failure) (HCC)   Acute on chronic respiratory failure with hypoxia (HCC)   Tracheostomy status (HCC)   Acute on chronic respiratory failure hypoxia-currently remains on ATC 40% with PMV.  Continue with PMV as patient tolerates. Myotonic dystrophy patient is at baseline we will continue with supportive care Healthcare associated pneumonia has been treated Tracheostomy status-remains in place and  stable.  Continue with trach care per protocol.  Remains stable and in place Chronic systolic diastolic heart failure compensated   I have personally seen and evaluated the patient, evaluated laboratory and imaging results, formulated the assessment and plan and placed orders. The Patient  requires high complexity decision making with multiple systems involvement.  Rounds were done with the Respiratory Therapy Director and Staff therapists and discussed with nursing staff also.  Yevonne Pax, MD [facility] Pulmonary Critical Care Medicine Sleep Medicine

## 2022-12-30 NOTE — Progress Notes (Signed)
Patient has been followed by IR for G tube placement, has been held due to fever and leukocytosis.   Fever resolved, leukocytosis better 10.9 yesterday.  Pt no on blood thinner, allergic to penicillin.  Consult done on 4/25, will repeat CBC with tomorrow and if no leukocytosis, will proceed with G tube on Monday, pending IR schedule.  IR will follow.   Lynann Bologna Traves Majchrzak PA-C 12/30/2022 11:54 AM

## 2022-12-31 DIAGNOSIS — J9621 Acute and chronic respiratory failure with hypoxia: Secondary | ICD-10-CM | POA: Diagnosis not present

## 2022-12-31 DIAGNOSIS — I5043 Acute on chronic combined systolic (congestive) and diastolic (congestive) heart failure: Secondary | ICD-10-CM | POA: Diagnosis not present

## 2022-12-31 DIAGNOSIS — Z930 Tracheostomy status: Secondary | ICD-10-CM | POA: Diagnosis not present

## 2022-12-31 LAB — CBC WITH DIFFERENTIAL/PLATELET
Abs Immature Granulocytes: 0.02 10*3/uL (ref 0.00–0.07)
Basophils Absolute: 0 10*3/uL (ref 0.0–0.1)
Basophils Relative: 1 %
Eosinophils Absolute: 0.3 10*3/uL (ref 0.0–0.5)
Eosinophils Relative: 5 %
HCT: 39.6 % (ref 36.0–46.0)
Hemoglobin: 12.7 g/dL (ref 12.0–15.0)
Immature Granulocytes: 0 %
Lymphocytes Relative: 33 %
Lymphs Abs: 2.3 10*3/uL (ref 0.7–4.0)
MCH: 31.1 pg (ref 26.0–34.0)
MCHC: 32.1 g/dL (ref 30.0–36.0)
MCV: 96.8 fL (ref 80.0–100.0)
Monocytes Absolute: 0.6 10*3/uL (ref 0.1–1.0)
Monocytes Relative: 9 %
Neutro Abs: 3.7 10*3/uL (ref 1.7–7.7)
Neutrophils Relative %: 52 %
Platelets: 354 10*3/uL (ref 150–400)
RBC: 4.09 MIL/uL (ref 3.87–5.11)
RDW: 15.5 % (ref 11.5–15.5)
WBC: 7 10*3/uL (ref 4.0–10.5)
nRBC: 0 % (ref 0.0–0.2)

## 2022-12-31 LAB — CULTURE, RESPIRATORY W GRAM STAIN

## 2022-12-31 MED ORDER — VANCOMYCIN HCL IN DEXTROSE 1-5 GM/200ML-% IV SOLN: 1000.0000 mg | INTRAVENOUS | Status: AC

## 2022-12-31 NOTE — Progress Notes (Signed)
CBC with diff w/o leukocytosis, patient afebrile today.   Patient is tentatively scheduled for image guided G tube placement tomorrow.  Spoke with [facility] RN, TF to be held at [facility].   Formal consult done, consents in IR.  Patient will received 1g Vancomycin  in IR for the procedure.   Lynann Bologna Hiral Lukasiewicz PA-C 12/31/2022 10:03 AM

## 2022-12-31 NOTE — Progress Notes (Signed)
PROGRESS NOTE    DAILEE KLINGEL  WJX:[id number] DOB: 1971-09-01 DOA: 12/06/2022  Brief Narrative:  LEILA GROTHE is an 52 y.o. female with history significant for myotonic dystrophy type I, sickle cell trait, high-grade heart block status post pacemaker, tobacco abuse, COPD who was admitted to [facility] on 11/11/2022 when she presented with acute hypoxemic respiratory failure thought to be secondary to community-acquired pneumonia in the setting of metapneumovirus.  Hospital course complicated by V-fib cardiac arrest.  Patient received treatment with IV antibiotics with ceftriaxone/azithromycin.  She was initially on oxygen 5 L nasal cannula.  On 11/14/2022 she suffered a ventricular fibrillation and was found to be pulseless and CODE BLUE was called.  Patient achieved ROSC after 15 minutes of CPR.  She received treatment with amiodarone, epinephrine and defibrillation during the code.  She was intubated and transferred to the ICU service.  She was treated with additional 6 days of antibiotics for healthcare associated pneumonia with ceftriaxone initially and subsequently switched to cefepime.  Sputum cultures that showed normal respiratory flora.  Patient was extubated on 11/17/2022 but unfortunately had to be reintubated on 11/18/2022 due to increased work of breathing and failing BiPAP.  She failed vent wean after reintubation therefore requiring tracheostomy which was placed on 11/27/2022.  Due to her complex medical problems she was transferred and admitted to [facility].  She had respiratory cultures collected on 12/08/2022 that showed abundant Hafnia alvei, moderate Corynebacterium pseudodiphtheriae.  She received treatment with IV meropenem.  Patient unfortunately had an episode of vomiting and aspirated and restarted on IV vancomycin, cefepime and also completed treatment with another round of ciprofloxacin.  As soon as she completed treatment with antibiotic she again had a  fever spike of 102 with elevated WBC count.  Assessment & Plan:  Active Problems: Acute hypoxemic respiratory failure Recurrent aspiration pneumonia Fever/leukocytosis Myotonic dystrophy type I Acute on chronic combined systolic and diastolic CHF with decreased EF of 20%  Status post V-fib arrest Dysphagia/protein calorie malnutrition   Acute hypoxemic respiratory failure/vent dependence/aspiration pneumonia: Patient noted to have left lower lobe pneumonia at the outside hospital.  She had episode of cardiac arrest with V-fib that required intubation and placement of tracheostomy.  Initially respiratory cultures showed metapneumovirus.  She received treatment with ceftriaxone, azithromycin.  Subsequently treated with IV cefepime at the outside hospital.  Tracheal aspirate showed Hafnia alvei susceptible to gentamicin, meropenem.  She received treatment with IV meropenem. Unfortunately had episode of vomiting and aspirated again and restarted on IV vancomycin, cefepime.  Chest x-ray showing bibasilar infiltrates concerning for pneumonia. Repeat respiratory cultures ordered.  Tracheal aspirate cultures showed abundant squamous epithelial cells, few WBC, rare gram-positive cocci in pairs, rare budding yeast, Pseudomonas aeruginosa. She was switched to ciprofloxacin.  As soon as she completed ciprofloxacin she again had a fever of 102 and WBC count elevated to 21.  She is now started on meropenem.  Recommend to follow-up for the final cultures and once finalized adjust antibiotics according to the susceptibilities and possibly de-escalate to ciprofloxacin with a plan to treat for duration of 1 week pending improvement.  Unfortunately she has dysphagia and is at risk for recurrent aspiration pneumonia despite being on antibiotics.  If her respiratory status does not improve, recommend to check CT of the chest which can be done without contrast if concern for renal compromise.  Monitor CBC, CMP while on  antibiotics.   Myotonic dystrophy/dysphagia/protein calorie malnutrition: Unfortunately due to her myotonic dystrophy she  is at risk for worsening weakness, recurrent aspiration and aspiration pneumonia despite being on antibiotics.  Continue supportive management per primary team.   Acute on chronic combined systolic/diastolic CHF with reduced ejection fraction of 20%/status post V-fib arrest: Patient went into cardiac arrest with V-fib at the acute facility requiring CPR, amiodarone, epinephrine and defibrillation.  A transthoracic echocardiogram showed biventricular failure with EF less than 20%, severely reduced RV function.  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  She is on digoxin, metoprolol, mexiletine and Aldactone.  Cardiology consulted by the primary team.  Further management per primary team and cardiology.   Dysphagia/protein calorie malnutrition: Due to her dysphagia she is at risk for recurrent aspiration and aspiration pneumonia despite being on antibiotics.  Management of PCM per the primary team.   Unfortunately due to her complex medical problems she is at risk for worsening and decompensation.  Plan of care discussed with the primary team and pharmacy.  Subjective: Patient had fever of 102 with elevated WBC count of 21 as soon as she completed the antibiotics.  Vomiting and aspiration event.  Chest x-ray showed increased bibasilar opacities.   Objective: Vitals: Temperature 97.8, Tmax 102, pulse 70, respiratory rate 21, blood pressure 121/55, oxygen saturation 98%  Examination: Constitutional: Ill-appearing female, appears very debilitated, opening eyes Head: Atraumatic, normocephalic Eyes: PERLA, EOMI  ENMT: external ears and nose appear normal, normal hearing, moist oral mucosa  Neck: supple, trach in place CVS: S1-S2   Respiratory: Coarse breath sounds with scattered  rhonchi Abdomen: soft nontender, nondistended, normal bowel sounds  Musculoskeletal: No edema                       Neuro: Debility with generalized weakness Psych: Not agitated Skin: no rashes   Data Reviewed: I have personally reviewed following labs and imaging studies  CBC: Recent Labs  Lab 12/25/22 0158 12/27/22 0951 12/29/22 0240 12/31/22 0134  WBC 6.2 21.9* 10.9* 7.0  NEUTROABS  --   --   --  3.7  HGB 11.6* 12.4 11.3* 12.7  HCT 37.8 38.8 35.2* 39.6  MCV 100.3* 98.0 98.1 96.8  PLT 330 367 352 354     Basic Metabolic Panel: Recent Labs  Lab 12/25/22 0158 12/27/22 0951 12/29/22 0240  NA 139 138 138  K 4.3 4.6 4.5  CL 101 98 97*  CO2 31 29 32  GLUCOSE 105* 143* 122*  BUN 10 19 11   CREATININE 0.47 0.66 0.47  CALCIUM 8.9 9.2 9.2     GFR: CrCl cannot be calculated (Unknown ideal weight.).  Liver Function Tests: No results for input(s): "AST", "ALT", "ALKPHOS", "BILITOT", "PROT", "ALBUMIN" in the last 168 hours.  CBG: No results for input(s): "GLUCAP" in the last 168 hours.   Recent Results (from the past 240 hour(s))  Culture, Respiratory w Gram Stain     Status: None (Preliminary result)   Collection Time: 12/28/22  3:49 PM   Specimen: Tracheal Aspirate; Respiratory  Result Value Ref Range Status   Specimen Description TRACHEAL ASPIRATE  Final   Special Requests NONE  Final   Gram Stain   Final    ABUNDANT SQUAMOUS EPITHELIAL CELLS PRESENT FEW WBC PRESENT, PREDOMINANTLY PMN RARE GRAM POSITIVE COCCI IN PAIRS RARE BUDDING YEAST SEEN    Culture   Final    RARE PSEUDOMONAS AERUGINOSA SUSCEPTIBILITIES TO FOLLOW Performed at [facility], [address], [place],  [place] [address]    Report Status PENDING  Incomplete      Radiology Studies: No results found.   Scheduled Meds: Please see MAR  Vonzella Nipple, MD 12/31/2022, 11:43 PM

## 2023-01-01 ENCOUNTER — Other Ambulatory Visit (HOSPITAL_COMMUNITY): Payer: 59

## 2023-01-01 DIAGNOSIS — Z431 Encounter for attention to gastrostomy: Secondary | ICD-10-CM | POA: Diagnosis not present

## 2023-01-01 DIAGNOSIS — J9621 Acute and chronic respiratory failure with hypoxia: Secondary | ICD-10-CM | POA: Diagnosis not present

## 2023-01-01 DIAGNOSIS — Z930 Tracheostomy status: Secondary | ICD-10-CM | POA: Diagnosis not present

## 2023-01-01 DIAGNOSIS — I5043 Acute on chronic combined systolic (congestive) and diastolic (congestive) heart failure: Secondary | ICD-10-CM | POA: Diagnosis not present

## 2023-01-01 HISTORY — PX: IR GASTROSTOMY TUBE MOD SED: IMG625

## 2023-01-01 LAB — CULTURE, RESPIRATORY W GRAM STAIN

## 2023-01-01 LAB — PROTIME-INR
INR: 1.6 — ABNORMAL HIGH (ref 0.8–1.2)
Prothrombin Time: 18.5 seconds — ABNORMAL HIGH (ref 11.4–15.2)

## 2023-01-01 MED ORDER — MIDAZOLAM HCL 2 MG/2ML IJ SOLN
INTRAMUSCULAR | Status: AC | PRN
  Administered 2023-01-01: .5 mg via INTRAVENOUS

## 2023-01-01 MED ORDER — VANCOMYCIN HCL IN DEXTROSE 1-5 GM/200ML-% IV SOLN
INTRAVENOUS | Status: AC | PRN
  Administered 2023-01-01: 1000 mg via INTRAVENOUS

## 2023-01-01 MED ORDER — FENTANYL CITRATE (PF) 100 MCG/2ML IJ SOLN
INTRAMUSCULAR | Status: AC | PRN
  Administered 2023-01-01: 25 ug via INTRAVENOUS

## 2023-01-01 MED ORDER — LIDOCAINE HCL 1 % IJ SOLN
INTRAMUSCULAR | Status: AC
  Filled 2023-01-01: qty 20

## 2023-01-01 MED ORDER — MIDAZOLAM HCL 2 MG/2ML IJ SOLN
INTRAMUSCULAR | Status: AC
  Filled 2023-01-01: qty 2

## 2023-01-01 MED ORDER — FENTANYL CITRATE (PF) 100 MCG/2ML IJ SOLN
INTRAMUSCULAR | Status: AC
  Filled 2023-01-01: qty 2

## 2023-01-01 MED ORDER — VANCOMYCIN HCL IN DEXTROSE 1-5 GM/200ML-% IV SOLN
INTRAVENOUS | Status: AC
  Filled 2023-01-01: qty 200

## 2023-01-01 MED ORDER — GLUCAGON HCL RDNA (DIAGNOSTIC) 1 MG IJ SOLR
INTRAMUSCULAR | Status: AC
  Filled 2023-01-01: qty 1

## 2023-01-01 MED ORDER — IOHEXOL 300 MG/ML  SOLN
50.0000 mL | Freq: Once | INTRAMUSCULAR | Status: AC | PRN
  Administered 2023-01-01: 15 mL

## 2023-01-01 NOTE — Progress Notes (Addendum)
Pulmonary Critical Care Medicine [facility] [place]   PULMONARY CRITICAL CARE SERVICE  PROGRESS NOTE     Cynthia Underwood  ZOX:[id number]  DOB: [birth year]   DOA: 12/06/2022  Referring Physician: Luna Kitchens, MD  HPI: Cynthia Underwood is a 52 y.o. female being followed for ventilator/airway/oxygen weaning Acute on Chronic Respiratory Failure.  Patient seen sitting lying in bed, currently on ATC 35%.  Patient unfortunately is not tolerating the PMV or deflation of cuff.  Medications: Reviewed on Rounds  Physical Exam:  Vitals: Temp 97.8, pulse 70, respirations 21, BP 124/55, SpO2 to 99%  Ventilator Settings ATC 35%  General: Comfortable at this time Neck: supple Cardiovascular: no malignant arrhythmias Respiratory: Bilaterally diminished Skin: no rash seen on limited exam Musculoskeletal: No gross abnormality Psychiatric:unable to assess Neurologic:no involuntary movements         Lab Data:   Basic Metabolic Panel: Recent Labs  Lab 12/27/22 0951 12/29/22 0240  NA 138 138  K 4.6 4.5  CL 98 97*  CO2 29 32  GLUCOSE 143* 122*  BUN 19 11  CREATININE 0.66 0.47  CALCIUM 9.2 9.2    ABG: No results for input(s): "PHART", "PCO2ART", "PO2ART", "HCO3", "O2SAT" in the last 168 hours.  Liver Function Tests: No results for input(s): "AST", "ALT", "ALKPHOS", "BILITOT", "PROT", "ALBUMIN" in the last 168 hours. No results for input(s): "LIPASE", "AMYLASE" in the last 168 hours. No results for input(s): "AMMONIA" in the last 168 hours.  CBC: Recent Labs  Lab 12/27/22 0951 12/29/22 0240 12/31/22 0134  WBC 21.9* 10.9* 7.0  NEUTROABS  --   --  3.7  HGB 12.4 11.3* 12.7  HCT 38.8 35.2* 39.6  MCV 98.0 98.1 96.8  PLT 367 352 354     Cardiac Enzymes: No results for input(s): "CKTOTAL", "CKMB", "CKMBINDEX", "TROPONINI" in the last 168 hours.  BNP (last 3 results) Recent Labs    11/11/22 1238 11/17/22 2150  BNP 58.5 145.2*     ProBNP (last 3  results) No results for input(s): "PROBNP" in the last 8760 hours.  Radiological Exams: IR GASTROSTOMY TUBE MOD SED  Result Date: 01/01/2023 INDICATION: 52 year old female referred for gastrostomy tube placement EXAM: PERC PLACEMENT GASTROSTOMY MEDICATIONS: 1 g vancomycin; Antibiotics were administered within 1 hour of the procedure. ANESTHESIA/SEDATION: Versed 0.5 mg IV; Fentanyl 25 mcg IV Moderate Sedation Time:  10 minutes The patient was continuously monitored during the procedure by the interventional radiology nurse under my direct supervision. CONTRAST:  15mL OMNIPAQUE IOHEXOL 300 MG/ML SOLN - administered into the gastric lumen. FLUOROSCOPY: Radiation Exposure Index (as provided by the fluoroscopic device): 3 mGy Kerma COMPLICATIONS: None PROCEDURE: Informed written consent was obtained from the patient and the patient's family after a thorough discussion of the procedural risks, benefits and alternatives. All questions were addressed. Maximal Sterile Barrier Technique was utilized including caps, mask, sterile gowns, sterile gloves, sterile drape, hand hygiene and skin antiseptic. A timeout was performed prior to the initiation of the procedure. The epigastrium was prepped with Betadine in a sterile fashion, and a sterile drape was applied covering the operative field. A sterile gown and sterile gloves were used for the procedure. A 5-French orogastric tube is placed under fluoroscopic guidance. Scout imaging of the abdomen confirms barium within the transverse colon. The stomach was distended with gas. Under fluoroscopic guidance, an 18 gauge needle was utilized to puncture the anterior wall of the body of the stomach. An Amplatz wire was advanced through the needle passing a T  fastener into the lumen of the stomach. The T fastener was secured for gastropexy. A 9-French sheath was inserted. A snare was advanced through the 9-French sheath. A Teena Dunk was advanced through the orogastric tube. It was  snared then pulled out the oral cavity, pulling the snare, as well. The leading edge of the gastrostomy was attached to the snare. It was then pulled down the esophagus and out the percutaneous site. Tube secured in place. Contrast was injected. Patient tolerated the procedure well and remained hemodynamically stable throughout. No complications were encountered and no significant blood loss encountered. IMPRESSION: Status post fluoroscopic placed percutaneous gastrostomy tube, with 20 [place] pull-through. Signed, Yvone Neu. Miachel Roux, RPVI [facility] Radiology Electronically Signed   By: Gilmer Mor D.O.   On: 01/01/2023 12:09    Assessment/Plan Active Problems:   Myotonic dystrophy (HCC)   Healthcare-associated pneumonia   Acute on chronic combined systolic and diastolic CHF (congestive heart failure) (HCC)   Acute on chronic respiratory failure with hypoxia (HCC)   Tracheostomy status (HCC)   Acute on chronic respiratory failure hypoxia-currently remains on ATC 35%.  Continue with PMV as patient tolerates. 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  Continue with trach care per protocol.  Remains stable and in place Chronic systolic diastolic heart failure compensated   I have personally seen and evaluated the patient, evaluated laboratory and imaging results, formulated the assessment and plan and placed orders. The Patient requires high complexity decision making with multiple systems involvement.  Rounds were done with the Respiratory Therapy Director and Staff therapists and discussed with nursing staff also.  Yevonne Pax, MD [facility] Pulmonary Critical Care Medicine Sleep Medicine

## 2023-01-01 NOTE — Procedures (Signed)
Interventional Radiology Procedure Note  Procedure: Placement of percutaneous 79F pull-through gastrostomy tube. Complications: None Recommendations: - OK to use the gtube in 4 hours - routine wound care    Signed,  Yvone Neu. Loreta Ave, DO, ABVM, RPVI

## 2023-01-01 NOTE — Progress Notes (Signed)
Pulmonary Critical Care Medicine [facility] [place]   PULMONARY CRITICAL CARE SERVICE  PROGRESS NOTE     Cynthia Underwood  [name]:[id number]  DOB: [birth year]   DOA: 12/06/2022  Referring Physician: Luna Kitchens, MD  HPI: Cynthia Underwood is a 52 y.o. female being followed for ventilator/airway/oxygen weaning Acute on Chronic Respiratory Failure.  Patient had a peg tube placed is on T collar appears to be comfortable  Medications: Reviewed on Rounds  Physical Exam:  Vitals: Temperature is 98.7 pulse 77 respiratory is 30 blood pressure 107/56 saturation 98%  Ventilator Settings T collar FiO2 is 40%  General: Comfortable at this time Neck: supple Cardiovascular: no malignant arrhythmias Skin: no rash seen on limited exam Musculoskeletal: No gross abnormality Psychiatric:unable to assess Neurologic:no involuntary movements         Lab Data:   Basic Metabolic Panel: Recent Labs  Lab 12/27/22 0951 12/29/22 0240  NA 138 138  K 4.6 4.5  CL 98 97*  CO2 29 32  GLUCOSE 143* 122*  BUN 19 11  CREATININE 0.66 0.47  CALCIUM 9.2 9.2    ABG: No results for input(s): "PHART", "PCO2ART", "PO2ART", "HCO3", "O2SAT" in the last 168 hours.  Liver Function Tests: No results for input(s): "AST", "ALT", "ALKPHOS", "BILITOT", "PROT", "ALBUMIN" in the last 168 hours. No results for input(s): "LIPASE", "AMYLASE" in the last 168 hours. No results for input(s): "AMMONIA" in the last 168 hours.  CBC: Recent Labs  Lab 12/27/22 0951 12/29/22 0240 12/31/22 0134  WBC 21.9* 10.9* 7.0  NEUTROABS  --   --  3.7  HGB 12.4 11.3* 12.7  HCT 38.8 35.2* 39.6  MCV 98.0 98.1 96.8  PLT 367 352 354    Cardiac Enzymes: No results for input(s): "CKTOTAL", "CKMB", "CKMBINDEX", "TROPONINI" in the last 168 hours.  BNP (last 3 results) Recent Labs    11/11/22 1238 11/17/22 2150  BNP 58.5 145.2*    ProBNP (last 3 results) No results for input(s): "PROBNP" in the last 8760  hours.  Radiological Exams: IR GASTROSTOMY TUBE MOD SED  Result Date: 01/01/2023 INDICATION: 52 year old female referred for gastrostomy tube placement EXAM: PERC PLACEMENT GASTROSTOMY MEDICATIONS: 1 g vancomycin; Antibiotics were administered within 1 hour of the procedure. ANESTHESIA/SEDATION: Versed 0.5 mg IV; Fentanyl 25 mcg IV Moderate Sedation Time:  10 minutes The patient was continuously monitored during the procedure by the interventional radiology nurse under my direct supervision. CONTRAST:  15mL OMNIPAQUE IOHEXOL 300 MG/ML SOLN - administered into the gastric lumen. FLUOROSCOPY: Radiation Exposure Index (as provided by the fluoroscopic device): 3 mGy Kerma COMPLICATIONS: None PROCEDURE: Informed written consent was obtained from the patient and the patient's family after a thorough discussion of the procedural risks, benefits and alternatives. All questions were addressed. Maximal Sterile Barrier Technique was utilized including caps, mask, sterile gowns, sterile gloves, sterile drape, hand hygiene and skin antiseptic. A timeout was performed prior to the initiation of the procedure. The epigastrium was prepped with Betadine in a sterile fashion, and a sterile drape was applied covering the operative field. A sterile gown and sterile gloves were used for the procedure. A 5-French orogastric tube is placed under fluoroscopic guidance. Scout imaging of the abdomen confirms barium within the transverse colon. The stomach was distended with gas. Under fluoroscopic guidance, an 18 gauge needle was utilized to puncture the anterior wall of the body of the stomach. An Amplatz wire was advanced through the needle passing a T fastener into the lumen of the stomach. The  T fastener was secured for gastropexy. A 9-French sheath was inserted. A snare was advanced through the 9-French sheath. A Cynthia Underwood was advanced through the orogastric tube. It was snared then pulled out the oral cavity, pulling the snare, as  well. The leading edge of the gastrostomy was attached to the snare. It was then pulled down the esophagus and out the percutaneous site. Tube secured in place. Contrast was injected. Patient tolerated the procedure well and remained hemodynamically stable throughout. No complications were encountered and no significant blood loss encountered. IMPRESSION: Status post fluoroscopic placed percutaneous gastrostomy tube, with 20 [place] pull-through. Signed, Yvone Neu. Miachel Roux, RPVI [facility] Radiology Electronically Signed   By: Gilmer Mor D.O.   On: 01/01/2023 12:09    Assessment/Plan Active Problems:   Myotonic dystrophy (HCC)   Healthcare-associated pneumonia   Acute on chronic combined systolic and diastolic CHF (congestive heart failure) (HCC)   Acute on chronic respiratory failure with hypoxia (HCC)   Tracheostomy status (HCC)   Acute on chronic respiratory failure hypoxia will continue with T collar trials continue secretion management pulmonary toilet. Myotonic dystrophy at baseline Healthcare associated pneumonia treated clinically improving Tracheostomy remains in place Combined systolic diastolic heart failure no change   I have personally seen and evaluated the patient, evaluated laboratory and imaging results, formulated the assessment and plan and placed orders. The Patient requires high complexity decision making with multiple systems involvement.  Rounds were done with the Respiratory Therapy Director and Staff therapists and discussed with nursing staff also.  Yevonne Pax, MD [facility] Pulmonary Critical Care Medicine Sleep Medicine

## 2023-01-02 DIAGNOSIS — Z930 Tracheostomy status: Secondary | ICD-10-CM | POA: Diagnosis not present

## 2023-01-02 DIAGNOSIS — J9621 Acute and chronic respiratory failure with hypoxia: Secondary | ICD-10-CM | POA: Diagnosis not present

## 2023-01-02 DIAGNOSIS — I5043 Acute on chronic combined systolic (congestive) and diastolic (congestive) heart failure: Secondary | ICD-10-CM | POA: Diagnosis not present

## 2023-01-02 NOTE — Progress Notes (Signed)
Pulmonary Critical Care Medicine [facility] [place]   PULMONARY CRITICAL CARE SERVICE  PROGRESS NOTE     Cynthia Underwood  ZOX:[id number]  DOB: 1971/08/24   DOA: 12/06/2022  Referring Physician: Luna Kitchens, MD  HPI: Cynthia Underwood is a 52 y.o. female being followed for ventilator/airway/oxygen weaning Acute on Chronic Respiratory Failure.  Patient is on T collar currently on 50% FiO2 has had copious secretions still  Medications: Reviewed on Rounds  Physical Exam:  Vitals: Temperature 97.4 pulse 94 respiratory 22 blood pressure is 117/62 saturation 97%  Ventilator Settings T collar FiO2 is 50%  General: Comfortable at this time Neck: supple Cardiovascular: no malignant arrhythmias Skin: no rash seen on limited exam Musculoskeletal: No gross abnormality Psychiatric:unable to assess Neurologic:no involuntary movements         Lab Data:   Basic Metabolic Panel: Recent Labs  Lab 12/27/22 0951 12/29/22 0240  NA 138 138  K 4.6 4.5  CL 98 97*  CO2 29 32  GLUCOSE 143* 122*  BUN 19 11  CREATININE 0.66 0.47  CALCIUM 9.2 9.2    ABG: No results for input(s): "PHART", "PCO2ART", "PO2ART", "HCO3", "O2SAT" in the last 168 hours.  Liver Function Tests: No results for input(s): "AST", "ALT", "ALKPHOS", "BILITOT", "PROT", "ALBUMIN" in the last 168 hours. No results for input(s): "LIPASE", "AMYLASE" in the last 168 hours. No results for input(s): "AMMONIA" in the last 168 hours.  CBC: Recent Labs  Lab 12/27/22 0951 12/29/22 0240 12/31/22 0134  WBC 21.9* 10.9* 7.0  NEUTROABS  --   --  3.7  HGB 12.4 11.3* 12.7  HCT 38.8 35.2* 39.6  MCV 98.0 98.1 96.8  PLT 367 352 354    Cardiac Enzymes: No results for input(s): "CKTOTAL", "CKMB", "CKMBINDEX", "TROPONINI" in the last 168 hours.  BNP (last 3 results) Recent Labs    11/11/22 1238 11/17/22 2150  BNP 58.5 145.2*    ProBNP (last 3 results) No results for input(s): "PROBNP" in the last  8760 hours.  Radiological Exams: IR GASTROSTOMY TUBE MOD SED  Result Date: 01/01/2023 INDICATION: 52 year old female referred for gastrostomy tube placement EXAM: PERC PLACEMENT GASTROSTOMY MEDICATIONS: 1 g vancomycin; Antibiotics were administered within 1 hour of the procedure. ANESTHESIA/SEDATION: Versed 0.5 mg IV; Fentanyl 25 mcg IV Moderate Sedation Time:  10 minutes The patient was continuously monitored during the procedure by the interventional radiology nurse under my direct supervision. CONTRAST:  15mL OMNIPAQUE IOHEXOL 300 MG/ML SOLN - administered into the gastric lumen. FLUOROSCOPY: Radiation Exposure Index (as provided by the fluoroscopic device): 3 mGy Kerma COMPLICATIONS: None PROCEDURE: Informed written consent was obtained from the patient and the patient's family after a thorough discussion of the procedural risks, benefits and alternatives. All questions were addressed. Maximal Sterile Barrier Technique was utilized including caps, mask, sterile gowns, sterile gloves, sterile drape, hand hygiene and skin antiseptic. A timeout was performed prior to the initiation of the procedure. The epigastrium was prepped with Betadine in a sterile fashion, and a sterile drape was applied covering the operative field. A sterile gown and sterile gloves were used for the procedure. A 5-French orogastric tube is placed under fluoroscopic guidance. Scout imaging of the abdomen confirms barium within the transverse colon. The stomach was distended with gas. Under fluoroscopic guidance, an 18 gauge needle was utilized to puncture the anterior wall of the body of the stomach. An Amplatz wire was advanced through the needle passing a T fastener into the lumen of the stomach. The T  fastener was secured for gastropexy. A 9-French sheath was inserted. A snare was advanced through the 9-French sheath. A Teena Dunk was advanced through the orogastric tube. It was snared then pulled out the oral cavity, pulling the snare,  as well. The leading edge of the gastrostomy was attached to the snare. It was then pulled down the esophagus and out the percutaneous site. Tube secured in place. Contrast was injected. Patient tolerated the procedure well and remained hemodynamically stable throughout. No complications were encountered and no significant blood loss encountered. IMPRESSION: Status post fluoroscopic placed percutaneous gastrostomy tube, with 20 [place] pull-through. Signed, Yvone Neu. Miachel Roux, RPVI [facility] Radiology Electronically Signed   By: Gilmer Mor D.O.   On: 01/01/2023 12:09    Assessment/Plan Active Problems:   Myotonic dystrophy (HCC)   Healthcare-associated pneumonia   Acute on chronic combined systolic and diastolic CHF (congestive heart failure) (HCC)   Acute on chronic respiratory failure with hypoxia (HCC)   Tracheostomy status (HCC)   Acute on chronic respiratory failure hypoxia will continue with T collar trials patient's FiO2 needs to be decreased Myotonic dystrophy no change will continue to monitor Combined systolic diastolic failure compensated Tracheostomy remains in place Healthcare associated pneumonia treated with antibiotics we will continue to follow along   I have personally seen and evaluated the patient, evaluated laboratory and imaging results, formulated the assessment and plan and placed orders. The Patient requires high complexity decision making with multiple systems involvement.  Rounds were done with the Respiratory Therapy Director and Staff therapists and discussed with nursing staff also.  Yevonne Pax, MD [facility] Pulmonary Critical Care Medicine Sleep Medicine

## 2023-01-03 ENCOUNTER — Encounter: Payer: Self-pay | Admitting: Gastroenterology

## 2023-01-03 DIAGNOSIS — I5043 Acute on chronic combined systolic (congestive) and diastolic (congestive) heart failure: Secondary | ICD-10-CM | POA: Diagnosis not present

## 2023-01-03 DIAGNOSIS — Z930 Tracheostomy status: Secondary | ICD-10-CM | POA: Diagnosis not present

## 2023-01-03 DIAGNOSIS — J9621 Acute and chronic respiratory failure with hypoxia: Secondary | ICD-10-CM | POA: Diagnosis not present

## 2023-01-03 NOTE — Progress Notes (Signed)
Pulmonary Critical Care Medicine [facility] [place]   PULMONARY CRITICAL CARE SERVICE  PROGRESS NOTE     Cynthia Underwood  WUJ:[id number]  DOB: 06/09/1971   DOA: 12/06/2022  Referring Physician: Luna Kitchens, MD  HPI: Cynthia Underwood is a 52 y.o. female being followed for ventilator/airway/oxygen weaning Acute on Chronic Respiratory Failure.  Patient is on T collar secretions are still copious patient is at baseline  Medications: Reviewed on Rounds  Physical Exam:  Vitals: Temperature 97.5 pulse 94 respiratory rate 20 blood pressure 95/60 saturation is 95%  Ventilator Settings off the ventilator T collar FiO2 40%  General: Comfortable at this time Neck: supple Cardiovascular: no malignant arrhythmias Skin: no rash seen on limited exam Musculoskeletal: No gross abnormality Psychiatric:unable to assess Neurologic:no involuntary movements         Lab Data:   Basic Metabolic Panel: Recent Labs  Lab 12/29/22 0240  NA 138  K 4.5  CL 97*  CO2 32  GLUCOSE 122*  BUN 11  CREATININE 0.47  CALCIUM 9.2    ABG: No results for input(s): "PHART", "PCO2ART", "PO2ART", "HCO3", "O2SAT" in the last 168 hours.  Liver Function Tests: No results for input(s): "AST", "ALT", "ALKPHOS", "BILITOT", "PROT", "ALBUMIN" in the last 168 hours. No results for input(s): "LIPASE", "AMYLASE" in the last 168 hours. No results for input(s): "AMMONIA" in the last 168 hours.  CBC: Recent Labs  Lab 12/29/22 0240 12/31/22 0134  WBC 10.9* 7.0  NEUTROABS  --  3.7  HGB 11.3* 12.7  HCT 35.2* 39.6  MCV 98.1 96.8  PLT 352 354    Cardiac Enzymes: No results for input(s): "CKTOTAL", "CKMB", "CKMBINDEX", "TROPONINI" in the last 168 hours.  BNP (last 3 results) Recent Labs    11/11/22 1238 11/17/22 2150  BNP 58.5 145.2*    ProBNP (last 3 results) No results for input(s): "PROBNP" in the last 8760 hours.  Radiological Exams: No results  found.  Assessment/Plan Active Problems:   Myotonic dystrophy (HCC)   Healthcare-associated pneumonia   Acute on chronic combined systolic and diastolic CHF (congestive heart failure) (HCC)   Acute on chronic respiratory failure with hypoxia (HCC)   Tracheostomy status (HCC)   Acute on chronic respiratory failure hypoxia patient will be continued on T collar trials titrate the oxygen as tolerated. Myotonic dystrophy patient is at baseline we will continue with supportive care Healthcare associated pneumonia has been treated Combined systolic diastolic heart failure supportive care Tracheostomy remains in place continue with routine tracheal care   I have personally seen and evaluated the patient, evaluated laboratory and imaging results, formulated the assessment and plan and placed orders. The Patient requires high complexity decision making with multiple systems involvement.  Rounds were done with the Respiratory Therapy Director and Staff therapists and discussed with nursing staff also.  Yevonne Pax, MD [facility] Pulmonary Critical Care Medicine Sleep Medicine

## 2023-01-04 DIAGNOSIS — Z930 Tracheostomy status: Secondary | ICD-10-CM | POA: Diagnosis not present

## 2023-01-04 DIAGNOSIS — I5043 Acute on chronic combined systolic (congestive) and diastolic (congestive) heart failure: Secondary | ICD-10-CM | POA: Diagnosis not present

## 2023-01-04 DIAGNOSIS — J9621 Acute and chronic respiratory failure with hypoxia: Secondary | ICD-10-CM | POA: Diagnosis not present

## 2023-01-04 LAB — CBC
HCT: 43.5 % (ref 36.0–46.0)
Hemoglobin: 13.4 g/dL (ref 12.0–15.0)
MCH: 30.9 pg (ref 26.0–34.0)
MCHC: 30.8 g/dL (ref 30.0–36.0)
MCV: 100.2 fL — ABNORMAL HIGH (ref 80.0–100.0)
Platelets: 387 10*3/uL (ref 150–400)
RBC: 4.34 MIL/uL (ref 3.87–5.11)
RDW: 15.2 % (ref 11.5–15.5)
WBC: 6.9 10*3/uL (ref 4.0–10.5)
nRBC: 0 % (ref 0.0–0.2)

## 2023-01-04 LAB — BASIC METABOLIC PANEL
Anion gap: 11 (ref 5–15)
BUN: 16 mg/dL (ref 6–20)
CO2: 34 mmol/L — ABNORMAL HIGH (ref 22–32)
Calcium: 9.4 mg/dL (ref 8.9–10.3)
Chloride: 90 mmol/L — ABNORMAL LOW (ref 98–111)
Creatinine, Ser: 0.54 mg/dL (ref 0.44–1.00)
GFR, Estimated: >60 mL/min (ref >60)
Glucose, Bld: 101 mg/dL — ABNORMAL HIGH (ref 70–99)
Potassium: 5.2 mmol/L — ABNORMAL HIGH (ref 3.5–5.1)
Sodium: 135 mmol/L (ref 135–145)

## 2023-01-04 LAB — POTASSIUM: Potassium: 3.9 mmol/L (ref 3.5–5.1)

## 2023-01-04 NOTE — Progress Notes (Cosign Needed Addendum)
Pulmonary Critical Care Medicine [facility] [place]   PULMONARY CRITICAL CARE SERVICE  PROGRESS NOTE     NEVIYAH TALON  JXB:[id number]  DOB: 13-Jan-1971   DOA: 12/06/2022  Referring Physician: Luna Kitchens, MD  HPI: UNDREA GANZER is a 52 y.o. female being followed for ventilator/airway/oxygen weaning Acute on Chronic Respiratory Failure.  Patient seen lying in bed, currently on ATC 40% FiO2  Medications: Reviewed on Rounds  Physical Exam:  Vitals: Temp 97.8, pulse 86, respirations 17, BP 94/64, SpO2 93%  Ventilator Settings ATC 40%  General: Comfortable at this time Neck: supple Cardiovascular: no malignant arrhythmias Respiratory: Bilaterally diminished Skin: no rash seen on limited exam Musculoskeletal: No gross abnormality Psychiatric:unable to assess Neurologic:no involuntary movements         Lab Data:   Basic Metabolic Panel: Recent Labs  Lab 12/29/22 0240 01/04/23 0244 01/04/23 1404  NA 138 135  --   K 4.5 5.2* 3.9  CL 97* 90*  --   CO2 32 34*  --   GLUCOSE 122* 101*  --   BUN 11 16  --   CREATININE 0.47 0.54  --   CALCIUM 9.2 9.4  --     ABG: No results for input(s): "PHART", "PCO2ART", "PO2ART", "HCO3", "O2SAT" in the last 168 hours.  Liver Function Tests: No results for input(s): "AST", "ALT", "ALKPHOS", "BILITOT", "PROT", "ALBUMIN" in the last 168 hours. No results for input(s): "LIPASE", "AMYLASE" in the last 168 hours. No results for input(s): "AMMONIA" in the last 168 hours.  CBC: Recent Labs  Lab 12/29/22 0240 12/31/22 0134 01/04/23 0244  WBC 10.9* 7.0 6.9  NEUTROABS  --  3.7  --   HGB 11.3* 12.7 13.4  HCT 35.2* 39.6 43.5  MCV 98.1 96.8 100.2*  PLT 352 354 387    Cardiac Enzymes: No results for input(s): "CKTOTAL", "CKMB", "CKMBINDEX", "TROPONINI" in the last 168 hours.  BNP (last 3 results) Recent Labs    11/11/22 1238 11/17/22 2150  BNP 58.5 145.2*    ProBNP (last 3 results) No results for  input(s): "PROBNP" in the last 8760 hours.  Radiological Exams: No results found.  Assessment/Plan Active Problems:   Myotonic dystrophy (HCC)   Healthcare-associated pneumonia   Acute on chronic combined systolic and diastolic CHF (congestive heart failure) (HCC)   Acute on chronic respiratory failure with hypoxia (HCC)   Tracheostomy status (HCC)   Acute on chronic respiratory failure hypoxia patient will be continued on T collars as patient tolerates.  Titrate the oxygen as tolerated. Myotonic dystrophy patient is at baseline we will continue with supportive care Healthcare associated pneumonia has been treated Combined systolic diastolic heart failure supportive care Tracheostomy remains in place continue with routine tracheal care   I have personally seen and evaluated the patient, evaluated laboratory and imaging results, formulated the assessment and plan and placed orders. The Patient requires high complexity decision making with multiple systems involvement.  Rounds were done with the Respiratory Therapy Director and Staff therapists and discussed with nursing staff also.  Yevonne Pax, MD [facility] Pulmonary Critical Care Medicine Sleep Medicine

## 2023-01-05 ENCOUNTER — Other Ambulatory Visit (HOSPITAL_COMMUNITY): Payer: 59

## 2023-01-05 DIAGNOSIS — K56609 Unspecified intestinal obstruction, unspecified as to partial versus complete obstruction: Secondary | ICD-10-CM | POA: Diagnosis not present

## 2023-01-05 DIAGNOSIS — J9621 Acute and chronic respiratory failure with hypoxia: Secondary | ICD-10-CM | POA: Diagnosis not present

## 2023-01-05 DIAGNOSIS — I5043 Acute on chronic combined systolic (congestive) and diastolic (congestive) heart failure: Secondary | ICD-10-CM | POA: Diagnosis not present

## 2023-01-05 DIAGNOSIS — Z930 Tracheostomy status: Secondary | ICD-10-CM | POA: Diagnosis not present

## 2023-01-05 DIAGNOSIS — K567 Ileus, unspecified: Secondary | ICD-10-CM | POA: Diagnosis not present

## 2023-01-05 LAB — CBC
HCT: 43.5 % (ref 36.0–46.0)
Hemoglobin: 14 g/dL (ref 12.0–15.0)
MCH: 31.3 pg (ref 26.0–34.0)
MCHC: 32.2 g/dL (ref 30.0–36.0)
MCV: 97.1 fL (ref 80.0–100.0)
Platelets: 337 10*3/uL (ref 150–400)
RBC: 4.48 MIL/uL (ref 3.87–5.11)
RDW: 15.2 % (ref 11.5–15.5)
WBC: 8 10*3/uL (ref 4.0–10.5)
nRBC: 0.2 % (ref 0.0–0.2)

## 2023-01-05 LAB — BASIC METABOLIC PANEL
Anion gap: 11 (ref 5–15)
BUN: 16 mg/dL (ref 6–20)
CO2: 34 mmol/L — ABNORMAL HIGH (ref 22–32)
Calcium: 9.5 mg/dL (ref 8.9–10.3)
Chloride: 92 mmol/L — ABNORMAL LOW (ref 98–111)
Creatinine, Ser: 0.51 mg/dL (ref 0.44–1.00)
GFR, Estimated: >60 mL/min (ref >60)
Glucose, Bld: 117 mg/dL — ABNORMAL HIGH (ref 70–99)
Potassium: 6.2 mmol/L — ABNORMAL HIGH (ref 3.5–5.1)
Sodium: 137 mmol/L (ref 135–145)

## 2023-01-05 LAB — DIGOXIN LEVEL: Digoxin Level: <0.2 ng/mL — ABNORMAL LOW (ref 0.8–2.0)

## 2023-01-05 LAB — MAGNESIUM: Magnesium: 2.3 mg/dL (ref 1.7–2.4)

## 2023-01-05 LAB — POTASSIUM: Potassium: 4.3 mmol/L (ref 3.5–5.1)

## 2023-01-05 MED ORDER — DIATRIZOATE MEGLUMINE & SODIUM 66-10 % PO SOLN
ORAL | Status: AC
  Filled 2023-01-05: qty 30

## 2023-01-05 NOTE — Progress Notes (Incomplete)
Pulmonary Critical Care Medicine [facility] [place]   PULMONARY CRITICAL CARE SERVICE  PROGRESS NOTE     Cynthia Underwood  ZOX:[id number]  DOB: 1971-02-08   DOA: 12/06/2022  Referring Physician: Luna Kitchens, MD  HPI: Cynthia Underwood is a 52 y.o. female being followed for ventilator/airway/oxygen weaning Acute on Chronic Respiratory Failure.  Patient seen lying in bed, currently on full support ventilation.  Was able to complete 4 hours of pressure support today.  Resume go ahead and move back to ATC as patient tolerates.  Medications: Reviewed on Rounds  Physical Exam:  Vitals: Temp 97.8, pulse 86, respirations 17, BP 94/64, SpO2 93%  Ventilator Settings AC VC, FiO2 45%, tidal volume 500, rate 14, PEEP 5  General: Comfortable at this time Neck: supple Cardiovascular: no malignant arrhythmias Respiratory: Bilaterally diminished Skin: no rash seen on limited exam Musculoskeletal: No gross abnormality Psychiatric:unable to assess Neurologic:no involuntary movements         Lab Data:   Basic Metabolic Panel: Recent Labs  Lab 01/04/23 0244 01/04/23 1404 01/05/23 1318  NA 135  --  137  K 5.2* 3.9 6.2*  CL 90*  --  92*  CO2 34*  --  34*  GLUCOSE 101*  --  117*  BUN 16  --  16  CREATININE 0.54  --  0.51  CALCIUM 9.4  --  9.5  MG  --   --  2.3     ABG: No results for input(s): "PHART", "PCO2ART", "PO2ART", "HCO3", "O2SAT" in the last 168 hours.  Liver Function Tests: No results for input(s): "AST", "ALT", "ALKPHOS", "BILITOT", "PROT", "ALBUMIN" in the last 168 hours. No results for input(s): "LIPASE", "AMYLASE" in the last 168 hours. No results for input(s): "AMMONIA" in the last 168 hours.  CBC: Recent Labs  Lab 12/31/22 0134 01/04/23 0244 01/05/23 1318  WBC 7.0 6.9 8.0  NEUTROABS 3.7  --   --   HGB 12.7 13.4 14.0  HCT 39.6 43.5 43.5  MCV 96.8 100.2* 97.1  PLT 354 387 337     Cardiac Enzymes: No results for input(s): "CKTOTAL",  "CKMB", "CKMBINDEX", "TROPONINI" in the last 168 hours.  BNP (last 3 results) Recent Labs    11/11/22 1238 11/17/22 2150  BNP 58.5 145.2*     ProBNP (last 3 results) No results for input(s): "PROBNP" in the last 8760 hours.  Radiological Exams: No results found.  Assessment/Plan Active Problems:   Myotonic dystrophy (HCC)   Healthcare-associated pneumonia   Acute on chronic combined systolic and diastolic CHF (congestive heart failure) (HCC)   Acute on chronic respiratory failure with hypoxia (HCC)   Tracheostomy status (HCC)   Acute on chronic respiratory failure hypoxia patient will be continued on T collars as patient tolerates.  Titrate the oxygen as tolerated. Myotonic dystrophy patient is at baseline we will continue with supportive care Healthcare associated pneumonia has been treated Combined systolic diastolic heart failure supportive care Tracheostomy remains in place continue with routine tracheal care   I have personally seen and evaluated the patient, evaluated laboratory and imaging results, formulated the assessment and plan and placed orders. The Patient requires high complexity decision making with multiple systems involvement.  Rounds were done with the Respiratory Therapy Director and Staff therapists and discussed with nursing staff also.  Yevonne Pax, MD [place] Pulmonary Critical Care Medicine Sleep Medicine

## 2023-01-06 DIAGNOSIS — Z930 Tracheostomy status: Secondary | ICD-10-CM | POA: Diagnosis not present

## 2023-01-06 DIAGNOSIS — I5043 Acute on chronic combined systolic (congestive) and diastolic (congestive) heart failure: Secondary | ICD-10-CM | POA: Diagnosis not present

## 2023-01-06 DIAGNOSIS — J9621 Acute and chronic respiratory failure with hypoxia: Secondary | ICD-10-CM | POA: Diagnosis not present

## 2023-01-06 LAB — POTASSIUM: Potassium: 3.5 mmol/L (ref 3.5–5.1)

## 2023-01-06 NOTE — Progress Notes (Addendum)
Pulmonary Critical Care Medicine [facility] [place]   PULMONARY CRITICAL CARE SERVICE  PROGRESS NOTE     Cynthia Underwood  ZOX:[id number]  DOB: 1971/03/25   DOA: 12/06/2022  Referring Physician: Luna Kitchens, MD  HPI: Cynthia Underwood is a 52 y.o. female being followed for ventilator/airway/oxygen weaning Acute on Chronic Respiratory Failure.  Patient seen lying in bed, currently back on ATC 28%.  Will continue with PMV as tolerated today.  Medications: Reviewed on Rounds  Physical Exam:  Vitals: Temp, respirations 26, 112/71, O2 96%  Ventilator Settings ATC 28% with PMV  General: Comfortable at this time Neck: supple Cardiovascular: no malignant arrhythmias Respiratory: Bilaterally diminished Skin: no rash seen on limited exam Musculoskeletal: No gross abnormality Psychiatric:unable to assess Neurologic:no involuntary movements         Lab Data:   Basic Metabolic Panel: Recent Labs  Lab 01/04/23 0244 01/04/23 1404 01/05/23 1318 01/05/23 1801 01/06/23 0132  NA 135  --  137  --   --   K 5.2* 3.9 6.2* 4.3 3.5  CL 90*  --  92*  --   --   CO2 34*  --  34*  --   --   GLUCOSE 101*  --  117*  --   --   BUN 16  --  16  --   --   CREATININE 0.54  --  0.51  --   --   CALCIUM 9.4  --  9.5  --   --   MG  --   --  2.3  --   --      ABG: No results for input(s): "PHART", "PCO2ART", "PO2ART", "HCO3", "O2SAT" in the last 168 hours.  Liver Function Tests: No results for input(s): "AST", "ALT", "ALKPHOS", "BILITOT", "PROT", "ALBUMIN" in the last 168 hours. No results for input(s): "LIPASE", "AMYLASE" in the last 168 hours. No results for input(s): "AMMONIA" in the last 168 hours.  CBC: Recent Labs  Lab 12/31/22 0134 01/04/23 0244 01/05/23 1318  WBC 7.0 6.9 8.0  NEUTROABS 3.7  --   --   HGB 12.7 13.4 14.0  HCT 39.6 43.5 43.5  MCV 96.8 100.2* 97.1  PLT 354 387 337     Cardiac Enzymes: No results for input(s): "CKTOTAL", "CKMB",  "CKMBINDEX", "TROPONINI" in the last 168 hours.  BNP (last 3 results) Recent Labs    11/11/22 1238 11/17/22 2150  BNP 58.5 145.2*     ProBNP (last 3 results) No results for input(s): "PROBNP" in the last 8760 hours.  Radiological Exams: DG Chest Port 1 View  Result Date: 01/05/2023 CLINICAL DATA:  Pneumonia EXAM: PORTABLE CHEST 1 VIEW COMPARISON:  Chest x-ray dated December 28, 2022 FINDINGS: Interval removal of feeding tube. Tracheostomy tube in place. Left chest wall dual lead pacer with unchanged lead position. Cardiac and mediastinal contours are within normal limits. Unchanged left-greater-than-right basilar opacities. No large pleural effusion or evidence of pneumothorax. IMPRESSION: 1. Interval removal of feeding tube. 2. Unchanged left-greater-than-right basilar opacities, likely due to lower lobe atelectasis. Electronically Signed   By: Allegra Lai M.D.   On: 01/05/2023 19:32   DG ABDOMEN PEG TUBE LOCATION  Result Date: 01/05/2023 CLINICAL DATA:  Ileus, small bowel obstruction EXAM: ABDOMEN - 1 VIEW COMPARISON:  CT 12/26/2022.  Plain films 12/29/2022. FINDINGS: Gastrostomy tube projects over the stomach. Contrast seen within the stomach. No contrast extravasation. Non dilated large and small bowel. Gas throughout the colon. No organomegaly or free air. Linear density  projects over the right lower quadrant/pelvis. This was not present on prior plain film or CT. This could be external to the patient. IMPRESSION: No evidence of bowel obstruction or ileus. No contrast extravasation from the G-tube or stomach. Linear density projects in the right lower quadrant/pelvis, new since prior imaging. This could be external to the patient. Recommend clinical correlation. Electronically Signed   By: Charlett Nose M.D.   On: 01/05/2023 19:30    Assessment/Plan Active Problems:   Myotonic dystrophy (HCC)   Healthcare-associated pneumonia   Acute on chronic combined systolic and diastolic CHF  (congestive heart failure) (HCC)   Acute on chronic respiratory failure with hypoxia (HCC)   Tracheostomy status (HCC)   Acute on chronic respiratory failure hypoxia patient will be continued on T collars as patient tolerates.  Titrate the oxygen as tolerated. Myotonic dystrophy patient is at baseline we will continue with supportive care Healthcare associated pneumonia has been treated Combined systolic diastolic heart failure supportive care Tracheostomy remains in place continue with routine tracheal care   I have personally seen and evaluated the patient, evaluated laboratory and imaging results, formulated the assessment and plan and placed orders. The Patient requires high complexity decision making with multiple systems involvement.  Rounds were done with the Respiratory Therapy Director and Staff therapists and discussed with nursing staff also.  Yevonne Pax, MD [facility] Pulmonary Critical Care Medicine Sleep Medicine

## 2023-01-07 DIAGNOSIS — I5043 Acute on chronic combined systolic (congestive) and diastolic (congestive) heart failure: Secondary | ICD-10-CM | POA: Diagnosis not present

## 2023-01-07 DIAGNOSIS — J9621 Acute and chronic respiratory failure with hypoxia: Secondary | ICD-10-CM | POA: Diagnosis not present

## 2023-01-07 DIAGNOSIS — Z930 Tracheostomy status: Secondary | ICD-10-CM | POA: Diagnosis not present

## 2023-01-07 NOTE — Progress Notes (Addendum)
Pulmonary Critical Care Medicine [facility] [place]   PULMONARY CRITICAL CARE SERVICE  PROGRESS NOTE     Cynthia Underwood  WUJ:[id number]  DOB: 1970-11-08   DOA: 12/06/2022  Referring Physician: Luna Kitchens, MD  HPI: Cynthia Underwood is a 52 y.o. female being followed for ventilator/airway/oxygen weaning Acute on Chronic Respiratory Failure.  Patient seen lying in bed, currently back on ATC 28%.  Will continue with PMV as tolerated today.  Medications: Reviewed on Rounds  Physical Exam:  Vitals: Temp 98.1, pulse 93, respirations 25, BP 96/55, Sp02 97%  Ventilator Settings ATC 28% with PMV  General: Comfortable at this time Neck: supple Cardiovascular: no malignant arrhythmias Respiratory: Bilaterally diminished Skin: no rash seen on limited exam Musculoskeletal: No gross abnormality Psychiatric:unable to assess Neurologic:no involuntary movements         Lab Data:   Basic Metabolic Panel: Recent Labs  Lab 01/04/23 0244 01/04/23 1404 01/05/23 1318 01/05/23 1801 01/06/23 0132  NA 135  --  137  --   --   K 5.2* 3.9 6.2* 4.3 3.5  CL 90*  --  92*  --   --   CO2 34*  --  34*  --   --   GLUCOSE 101*  --  117*  --   --   BUN 16  --  16  --   --   CREATININE 0.54  --  0.51  --   --   CALCIUM 9.4  --  9.5  --   --   MG  --   --  2.3  --   --     ABG: No results for input(s): "PHART", "PCO2ART", "PO2ART", "HCO3", "O2SAT" in the last 168 hours.  Liver Function Tests: No results for input(s): "AST", "ALT", "ALKPHOS", "BILITOT", "PROT", "ALBUMIN" in the last 168 hours. No results for input(s): "LIPASE", "AMYLASE" in the last 168 hours. No results for input(s): "AMMONIA" in the last 168 hours.  CBC: Recent Labs  Lab 01/04/23 0244 01/05/23 1318  WBC 6.9 8.0  HGB 13.4 14.0  HCT 43.5 43.5  MCV 100.2* 97.1  PLT 387 337     Cardiac Enzymes: No results for input(s): "CKTOTAL", "CKMB", "CKMBINDEX", "TROPONINI" in the last 168 hours.  BNP  (last 3 results) Recent Labs    11/11/22 1238 11/17/22 2150  BNP 58.5 145.2*     ProBNP (last 3 results) No results for input(s): "PROBNP" in the last 8760 hours.  Radiological Exams: DG Chest Port 1 View  Result Date: 01/05/2023 CLINICAL DATA:  Pneumonia EXAM: PORTABLE CHEST 1 VIEW COMPARISON:  Chest x-ray dated December 28, 2022 FINDINGS: Interval removal of feeding tube. Tracheostomy tube in place. Left chest wall dual lead pacer with unchanged lead position. Cardiac and mediastinal contours are within normal limits. Unchanged left-greater-than-right basilar opacities. No large pleural effusion or evidence of pneumothorax. IMPRESSION: 1. Interval removal of feeding tube. 2. Unchanged left-greater-than-right basilar opacities, likely due to lower lobe atelectasis. Electronically Signed   By: Allegra Lai M.D.   On: 01/05/2023 19:32   DG ABDOMEN PEG TUBE LOCATION  Result Date: 01/05/2023 CLINICAL DATA:  Ileus, small bowel obstruction EXAM: ABDOMEN - 1 VIEW COMPARISON:  CT 12/26/2022.  Plain films 12/29/2022. FINDINGS: Gastrostomy tube projects over the stomach. Contrast seen within the stomach. No contrast extravasation. Non dilated large and small bowel. Gas throughout the colon. No organomegaly or free air. Linear density projects over the right lower quadrant/pelvis. This was not present on prior plain  film or CT. This could be external to the patient. IMPRESSION: No evidence of bowel obstruction or ileus. No contrast extravasation from the G-tube or stomach. Linear density projects in the right lower quadrant/pelvis, new since prior imaging. This could be external to the patient. Recommend clinical correlation. Electronically Signed   By: Charlett Nose M.D.   On: 01/05/2023 19:30    Assessment/Plan Active Problems:   Myotonic dystrophy (HCC)   Healthcare-associated pneumonia   Acute on chronic combined systolic and diastolic CHF (congestive heart failure) (HCC)   Acute on chronic  respiratory failure with hypoxia (HCC)   Tracheostomy status (HCC)   Acute on chronic respiratory failure hypoxia patient will be continued on T collars as patient tolerates.  Titrate the oxygen as tolerated. Myotonic dystrophy patient is at baseline we will continue with supportive care Healthcare associated pneumonia has been treated Combined systolic diastolic heart failure supportive care Tracheostomy remains in place continue with routine tracheal care   I have personally seen and evaluated the patient, evaluated laboratory and imaging results, formulated the assessment and plan and placed orders. The Patient requires high complexity decision making with multiple systems involvement.  Rounds were done with the Respiratory Therapy Director and Staff therapists and discussed with nursing staff also.  Yevonne Pax, MD [facility] Pulmonary Critical Care Medicine Sleep Medicine

## 2023-01-08 DIAGNOSIS — I5043 Acute on chronic combined systolic (congestive) and diastolic (congestive) heart failure: Secondary | ICD-10-CM | POA: Diagnosis not present

## 2023-01-08 DIAGNOSIS — Z930 Tracheostomy status: Secondary | ICD-10-CM | POA: Diagnosis not present

## 2023-01-08 DIAGNOSIS — J9621 Acute and chronic respiratory failure with hypoxia: Secondary | ICD-10-CM | POA: Diagnosis not present

## 2023-01-08 LAB — CBC
HCT: 42.4 % (ref 36.0–46.0)
Hemoglobin: 13.4 g/dL (ref 12.0–15.0)
MCH: 30.3 pg (ref 26.0–34.0)
MCHC: 31.6 g/dL (ref 30.0–36.0)
MCV: 95.9 fL (ref 80.0–100.0)
Platelets: 357 10*3/uL (ref 150–400)
RBC: 4.42 MIL/uL (ref 3.87–5.11)
RDW: 15.3 % (ref 11.5–15.5)
WBC: 7.9 10*3/uL (ref 4.0–10.5)
nRBC: 0 % (ref 0.0–0.2)

## 2023-01-08 LAB — BASIC METABOLIC PANEL
Anion gap: 12 (ref 5–15)
BUN: 14 mg/dL (ref 6–20)
CO2: 27 mmol/L (ref 22–32)
Calcium: 9.5 mg/dL (ref 8.9–10.3)
Chloride: 99 mmol/L (ref 98–111)
Creatinine, Ser: 0.45 mg/dL (ref 0.44–1.00)
GFR, Estimated: >60 mL/min (ref >60)
Glucose, Bld: 119 mg/dL — ABNORMAL HIGH (ref 70–99)
Potassium: 3.9 mmol/L (ref 3.5–5.1)
Sodium: 138 mmol/L (ref 135–145)

## 2023-01-08 NOTE — Progress Notes (Addendum)
Pulmonary Critical Care Medicine [facility] [place]   PULMONARY CRITICAL CARE SERVICE  PROGRESS NOTE     Cynthia Underwood  [name]:[id number]  DOB: June 11, 1971   DOA: 12/06/2022  Referring Physician: Luna Kitchens, MD  HPI: Cynthia Underwood is a 52 y.o. female being followed for ventilator/airway/oxygen weaning Acute on Chronic Respiratory Failure.  Patient seen lying in bed, currently back on ATC 35%.  Will continue with PMV as tolerated today. Will attempt capping trials  Medications: Reviewed on Rounds  Physical Exam:  Vitals: Temp 97.4, pulse 70, respirations 22, BP 94/55, sp02 93%  Ventilator Settings ATC 35% with PMV  General: Comfortable at this time Neck: supple Cardiovascular: no malignant arrhythmias Respiratory: Bilaterally diminished Skin: no rash seen on limited exam Musculoskeletal: No gross abnormality Psychiatric:unable to assess Neurologic:no involuntary movements         Lab Data:   Basic Metabolic Panel: Recent Labs  Lab 01/04/23 0244 01/04/23 1404 01/05/23 1318 01/05/23 1801 01/06/23 0132 01/08/23 0204  NA 135  --  137  --   --  138  K 5.2* 3.9 6.2* 4.3 3.5 3.9  CL 90*  --  92*  --   --  99  CO2 34*  --  34*  --   --  27  GLUCOSE 101*  --  117*  --   --  119*  BUN 16  --  16  --   --  14  CREATININE 0.54  --  0.51  --   --  0.45  CALCIUM 9.4  --  9.5  --   --  9.5  MG  --   --  2.3  --   --   --      ABG: No results for input(s): "PHART", "PCO2ART", "PO2ART", "HCO3", "O2SAT" in the last 168 hours.  Liver Function Tests: No results for input(s): "AST", "ALT", "ALKPHOS", "BILITOT", "PROT", "ALBUMIN" in the last 168 hours. No results for input(s): "LIPASE", "AMYLASE" in the last 168 hours. No results for input(s): "AMMONIA" in the last 168 hours.  CBC: Recent Labs  Lab 01/04/23 0244 01/05/23 1318 01/08/23 0204  WBC 6.9 8.0 7.9  HGB 13.4 14.0 13.4  HCT 43.5 43.5 42.4  MCV 100.2* 97.1 95.9  PLT 387 337 357      Cardiac Enzymes: No results for input(s): "CKTOTAL", "CKMB", "CKMBINDEX", "TROPONINI" in the last 168 hours.  BNP (last 3 results) Recent Labs    11/11/22 1238 11/17/22 2150  BNP 58.5 145.2*     ProBNP (last 3 results) No results for input(s): "PROBNP" in the last 8760 hours.  Radiological Exams: No results found.  Assessment/Plan Active Problems:   Myotonic dystrophy (HCC)   Healthcare-associated pneumonia   Acute on chronic combined systolic and diastolic CHF (congestive heart failure) (HCC)   Acute on chronic respiratory failure with hypoxia (HCC)   Tracheostomy status (HCC)   Acute on chronic respiratory failure hypoxia patient will be continued on T collars as patient tolerates.  Titrate the oxygen as tolerated. Will start capping trails as tolerated. Myotonic dystrophy patient is at baseline we will continue with supportive care Healthcare associated pneumonia has been treated Combined systolic diastolic heart failure supportive care Tracheostomy remains in place continue with routine tracheal care   I have personally seen and evaluated the patient, evaluated laboratory and imaging results, formulated the assessment and plan and placed orders. The Patient requires high complexity decision making with multiple systems involvement.  Rounds were done with the  Respiratory Therapy Director and Staff therapists and discussed with nursing staff also.  Allyne Gee, MD [facility] Pulmonary Critical Care Medicine Sleep Medicine

## 2023-01-09 DIAGNOSIS — Z930 Tracheostomy status: Secondary | ICD-10-CM | POA: Diagnosis not present

## 2023-01-09 DIAGNOSIS — J9621 Acute and chronic respiratory failure with hypoxia: Secondary | ICD-10-CM | POA: Diagnosis not present

## 2023-01-09 DIAGNOSIS — I5043 Acute on chronic combined systolic (congestive) and diastolic (congestive) heart failure: Secondary | ICD-10-CM | POA: Diagnosis not present

## 2023-01-09 NOTE — Progress Notes (Addendum)
Pulmonary Critical Care Medicine [facility] [place]   PULMONARY CRITICAL CARE SERVICE  PROGRESS NOTE     Cynthia Underwood  ZOX:[id number]  DOB: 12-05-1970   DOA: 12/06/2022  Referring Physician: Luna Kitchens, MD  HPI: Cynthia Underwood is a 52 y.o. female being followed for ventilator/airway/oxygen weaning Acute on Chronic Respiratory Failure.  Patient seen lying in bed, currently back on ATC 35%.  Will continue with PMV as tolerated today. Will attempt capping trials  Medications: Reviewed on Rounds  Physical Exam:  Vitals: Temp 97.4, pulse 70, respirations 22, BP 94/55, sp02 93%  Ventilator Settings ATC 35% with PMV  General: Comfortable at this time Neck: supple Cardiovascular: no malignant arrhythmias Respiratory: Bilaterally diminished Skin: no rash seen on limited exam Musculoskeletal: No gross abnormality Psychiatric:unable to assess Neurologic:no involuntary movements         Lab Data:   Basic Metabolic Panel: Recent Labs  Lab 01/04/23 0244 01/04/23 1404 01/05/23 1318 01/05/23 1801 01/06/23 0132 01/08/23 0204  NA 135  --  137  --   --  138  K 5.2* 3.9 6.2* 4.3 3.5 3.9  CL 90*  --  92*  --   --  99  CO2 34*  --  34*  --   --  27  GLUCOSE 101*  --  117*  --   --  119*  BUN 16  --  16  --   --  14  CREATININE 0.54  --  0.51  --   --  0.45  CALCIUM 9.4  --  9.5  --   --  9.5  MG  --   --  2.3  --   --   --      ABG: No results for input(s): "PHART", "PCO2ART", "PO2ART", "HCO3", "O2SAT" in the last 168 hours.  Liver Function Tests: No results for input(s): "AST", "ALT", "ALKPHOS", "BILITOT", "PROT", "ALBUMIN" in the last 168 hours. No results for input(s): "LIPASE", "AMYLASE" in the last 168 hours. No results for input(s): "AMMONIA" in the last 168 hours.  CBC: Recent Labs  Lab 01/04/23 0244 01/05/23 1318 01/08/23 0204  WBC 6.9 8.0 7.9  HGB 13.4 14.0 13.4  HCT 43.5 43.5 42.4  MCV 100.2* 97.1 95.9  PLT 387 337 357      Cardiac Enzymes: No results for input(s): "CKTOTAL", "CKMB", "CKMBINDEX", "TROPONINI" in the last 168 hours.  BNP (last 3 results) Recent Labs    11/11/22 1238 11/17/22 2150  BNP 58.5 145.2*     ProBNP (last 3 results) No results for input(s): "PROBNP" in the last 8760 hours.  Radiological Exams: No results found.  Assessment/Plan Active Problems:   Myotonic dystrophy (HCC)   Healthcare-associated pneumonia   Acute on chronic combined systolic and diastolic CHF (congestive heart failure) (HCC)   Acute on chronic respiratory failure with hypoxia (HCC)   Tracheostomy status (HCC)   Acute on chronic respiratory failure hypoxia patient will be continued on T collars as patient tolerates.  Titrate the oxygen as tolerated. Will start capping trails as tolerated. Myotonic dystrophy patient is at baseline we will continue with supportive care Healthcare associated pneumonia has been treated Combined systolic diastolic heart failure supportive care Tracheostomy remains in place continue with routine tracheal care   I have personally seen and evaluated the patient, evaluated laboratory and imaging results, formulated the assessment and plan and placed orders. The Patient requires high complexity decision making with multiple systems involvement.  Rounds were done with the  Respiratory Therapy Director and Staff therapists and discussed with nursing staff also.  Allyne Gee, MD [facility] Pulmonary Critical Care Medicine Sleep Medicine

## 2023-01-10 NOTE — Progress Notes (Addendum)
Pulmonary Critical Care Medicine [facility] [place]   PULMONARY CRITICAL CARE SERVICE  PROGRESS NOTE     NAPHTALI BERRINGER  [name]:[id number]  DOB: 16-Apr-1971   DOA: 12/06/2022  Referring Physician: Luna Kitchens, MD  HPI: MIKELA MAYORAL is a 52 y.o. female being followed for ventilator/airway/oxygen weaning Acute on Chronic Respiratory Failure.  Patient seen lying in bed, currently remains capped for almost 48 hours.  Will work towards 72-hour capping goal once completes 48-hour goal at 1518 today.  Medications: Reviewed on Rounds  Physical Exam:  Vitals: Temp 97.5, pulse 80, respirations 30, BP 100/58, SpO2 95%  Ventilator Settings capped on 2 LNC  General: Comfortable at this time Neck: supple Cardiovascular: no malignant arrhythmias Respiratory: Bilaterally diminished Skin: no rash seen on limited exam Musculoskeletal: No gross abnormality Psychiatric:unable to assess Neurologic:no involuntary movements         Lab Data:   Basic Metabolic Panel: Recent Labs  Lab 01/04/23 0244 01/04/23 1404 01/05/23 1318 01/05/23 1801 01/06/23 0132 01/08/23 0204  NA 135  --  137  --   --  138  K 5.2* 3.9 6.2* 4.3 3.5 3.9  CL 90*  --  92*  --   --  99  CO2 34*  --  34*  --   --  27  GLUCOSE 101*  --  117*  --   --  119*  BUN 16  --  16  --   --  14  CREATININE 0.54  --  0.51  --   --  0.45  CALCIUM 9.4  --  9.5  --   --  9.5  MG  --   --  2.3  --   --   --      ABG: No results for input(s): "PHART", "PCO2ART", "PO2ART", "HCO3", "O2SAT" in the last 168 hours.  Liver Function Tests: No results for input(s): "AST", "ALT", "ALKPHOS", "BILITOT", "PROT", "ALBUMIN" in the last 168 hours. No results for input(s): "LIPASE", "AMYLASE" in the last 168 hours. No results for input(s): "AMMONIA" in the last 168 hours.  CBC: Recent Labs  Lab 01/04/23 0244 01/05/23 1318 01/08/23 0204  WBC 6.9 8.0 7.9  HGB 13.4 14.0 13.4  HCT 43.5 43.5 42.4  MCV 100.2* 97.1  95.9  PLT 387 337 357     Cardiac Enzymes: No results for input(s): "CKTOTAL", "CKMB", "CKMBINDEX", "TROPONINI" in the last 168 hours.  BNP (last 3 results) Recent Labs    11/11/22 1238 11/17/22 2150  BNP 58.5 145.2*     ProBNP (last 3 results) No results for input(s): "PROBNP" in the last 8760 hours.  Radiological Exams: No results found.  Assessment/Plan Active Problems:   Myotonic dystrophy (HCC)   Healthcare-associated pneumonia   Acute on chronic combined systolic and diastolic CHF (congestive heart failure) (HCC)   Acute on chronic respiratory failure with hypoxia (HCC)   Tracheostomy status (HCC)   Acute on chronic respiratory failure hypoxia currently remains capped for almost 48 hours.  Will work towards 72-hour capping goal. Myotonic dystrophy patient is at baseline we will continue with supportive care Healthcare associated pneumonia has been treated Combined systolic diastolic heart failure supportive care Tracheostomy remains in place continue with routine tracheal care   I have personally seen and evaluated the patient, evaluated laboratory and imaging results, formulated the assessment and plan and placed orders. The Patient requires high complexity decision making with multiple systems involvement.  Rounds were done with the Respiratory Therapy Director and  Staff therapists and discussed with nursing staff also.  Allyne Gee, MD [facility] Pulmonary Critical Care Medicine Sleep Medicine

## 2023-01-12 ENCOUNTER — Other Ambulatory Visit (HOSPITAL_COMMUNITY): Payer: 59

## 2023-01-12 DIAGNOSIS — R059 Cough, unspecified: Secondary | ICD-10-CM | POA: Diagnosis not present

## 2023-01-12 DIAGNOSIS — J9811 Atelectasis: Secondary | ICD-10-CM | POA: Diagnosis not present

## 2023-01-12 LAB — COMPREHENSIVE METABOLIC PANEL
ALT: 61 U/L — ABNORMAL HIGH (ref 0–44)
AST: 46 U/L — ABNORMAL HIGH (ref 15–41)
Albumin: 2.6 g/dL — ABNORMAL LOW (ref 3.5–5.0)
Alkaline Phosphatase: 88 U/L (ref 38–126)
Anion gap: 9 (ref 5–15)
BUN: 20 mg/dL (ref 6–20)
CO2: 31 mmol/L (ref 22–32)
Calcium: 9.1 mg/dL (ref 8.9–10.3)
Chloride: 99 mmol/L (ref 98–111)
Creatinine, Ser: 0.6 mg/dL (ref 0.44–1.00)
GFR, Estimated: >60 mL/min (ref >60)
Glucose, Bld: 117 mg/dL — ABNORMAL HIGH (ref 70–99)
Potassium: 4.1 mmol/L (ref 3.5–5.1)
Sodium: 139 mmol/L (ref 135–145)
Total Bilirubin: 0.4 mg/dL (ref 0.3–1.2)
Total Protein: 7.5 g/dL (ref 6.5–8.1)

## 2023-01-12 LAB — CBC
HCT: 36.9 % (ref 36.0–46.0)
Hemoglobin: 11.7 g/dL — ABNORMAL LOW (ref 12.0–15.0)
MCH: 31.3 pg (ref 26.0–34.0)
MCHC: 31.7 g/dL (ref 30.0–36.0)
MCV: 98.7 fL (ref 80.0–100.0)
Platelets: 304 10*3/uL (ref 150–400)
RBC: 3.74 MIL/uL — ABNORMAL LOW (ref 3.87–5.11)
RDW: 15.4 % (ref 11.5–15.5)
WBC: 6.9 10*3/uL (ref 4.0–10.5)
nRBC: 0 % (ref 0.0–0.2)

## 2023-01-12 NOTE — Progress Notes (Addendum)
Pulmonary Critical Care Medicine [facility] [place]   PULMONARY CRITICAL CARE SERVICE  PROGRESS NOTE     Cynthia Underwood  ZOX:[id number]  DOB: 05-Jan-1971   DOA: 12/06/2022  Referring Physician: Luna Kitchens, MD  HPI: Cynthia Underwood is a 52 y.o. female being followed for ventilator/airway/oxygen weaning Acute on Chronic Respiratory Failure.  Patient seen lying in bed, currently capped on 2 L nasal cannula.  Will go ahead and order for decannulation today.  Medications: Reviewed on Rounds  Physical Exam:  Vitals: Temp 98.6, pulse 79, respirations 19, BP 98/83, SpO2 97%  Ventilator Settings capped on 2 LNC  General: Comfortable at this time Neck: supple Cardiovascular: no malignant arrhythmias Respiratory: Bilaterally diminished Skin: no rash seen on limited exam Musculoskeletal: No gross abnormality Psychiatric:unable to assess Neurologic:no involuntary movements         Lab Data:   Basic Metabolic Panel: Recent Labs  Lab 01/05/23 1318 01/05/23 1801 01/06/23 0132 01/08/23 0204  NA 137  --   --  138  K 6.2* 4.3 3.5 3.9  CL 92*  --   --  99  CO2 34*  --   --  27  GLUCOSE 117*  --   --  119*  BUN 16  --   --  14  CREATININE 0.51  --   --  0.45  CALCIUM 9.5  --   --  9.5  MG 2.3  --   --   --      ABG: No results for input(s): "PHART", "PCO2ART", "PO2ART", "HCO3", "O2SAT" in the last 168 hours.  Liver Function Tests: No results for input(s): "AST", "ALT", "ALKPHOS", "BILITOT", "PROT", "ALBUMIN" in the last 168 hours. No results for input(s): "LIPASE", "AMYLASE" in the last 168 hours. No results for input(s): "AMMONIA" in the last 168 hours.  CBC: Recent Labs  Lab 01/05/23 1318 01/08/23 0204  WBC 8.0 7.9  HGB 14.0 13.4  HCT 43.5 42.4  MCV 97.1 95.9  PLT 337 357     Cardiac Enzymes: No results for input(s): "CKTOTAL", "CKMB", "CKMBINDEX", "TROPONINI" in the last 168 hours.  BNP (last 3 results) Recent Labs    11/11/22 1238  11/17/22 2150  BNP 58.5 145.2*     ProBNP (last 3 results) No results for input(s): "PROBNP" in the last 8760 hours.  Radiological Exams: No results found.  Assessment/Plan Active Problems:   Myotonic dystrophy (HCC)   Healthcare-associated pneumonia   Acute on chronic combined systolic and diastolic CHF (congestive heart failure) (HCC)   Acute on chronic respiratory failure with hypoxia (HCC)   Tracheostomy status (HCC)   Acute on chronic respiratory failure hypoxia patient will be continued on T collars as patient tolerates.  Remains capped on 2 L nasal cannula.  Order for decannulation has been placed. Myotonic dystrophy patient is at baseline we will continue with supportive care Healthcare associated pneumonia has been treated Combined systolic diastolic heart failure supportive care Tracheostomy-order for decannulation.  Continue with stoma care per protocol.   I have personally seen and evaluated the patient, evaluated laboratory and imaging results, formulated the assessment and plan and placed orders. The Patient requires high complexity decision making with multiple systems involvement.  Rounds were done with the Respiratory Therapy Director and Staff therapists and discussed with nursing staff also.  Yevonne Pax, MD [facility] Pulmonary Critical Care Medicine Sleep Medicine

## 2023-01-18 LAB — CBC
HCT: 37.8 % (ref 36.0–46.0)
Hemoglobin: 11.8 g/dL — ABNORMAL LOW (ref 12.0–15.0)
MCH: 30.3 pg (ref 26.0–34.0)
MCHC: 31.2 g/dL (ref 30.0–36.0)
MCV: 97.2 fL (ref 80.0–100.0)
Platelets: 286 10*3/uL (ref 150–400)
RBC: 3.89 MIL/uL (ref 3.87–5.11)
RDW: 16 % — ABNORMAL HIGH (ref 11.5–15.5)
WBC: 6.8 10*3/uL (ref 4.0–10.5)
nRBC: 0 % (ref 0.0–0.2)

## 2023-01-18 LAB — BASIC METABOLIC PANEL
Anion gap: 10 (ref 5–15)
BUN: 15 mg/dL (ref 6–20)
CO2: 29 mmol/L (ref 22–32)
Calcium: 9.2 mg/dL (ref 8.9–10.3)
Chloride: 101 mmol/L (ref 98–111)
Creatinine, Ser: 0.5 mg/dL (ref 0.44–1.00)
GFR, Estimated: >60 mL/min (ref >60)
Glucose, Bld: 92 mg/dL (ref 70–99)
Potassium: 4.3 mmol/L (ref 3.5–5.1)
Sodium: 140 mmol/L (ref 135–145)

## 2023-01-18 LAB — MAGNESIUM: Magnesium: 2.1 mg/dL (ref 1.7–2.4)

## 2023-01-22 ENCOUNTER — Emergency Department (HOSPITAL_COMMUNITY): Payer: 59

## 2023-01-22 ENCOUNTER — Other Ambulatory Visit: Payer: Self-pay

## 2023-01-22 ENCOUNTER — Ambulatory Visit: Payer: Self-pay

## 2023-01-22 ENCOUNTER — Inpatient Hospital Stay (HOSPITAL_COMMUNITY)
Admission: EM | Admit: 2023-01-22 | Discharge: 2023-01-30 | DRG: 177 | Disposition: A | Discharge status: Skilled Nursing Facility | Payer: 59 | Attending: Internal Medicine | Admitting: Internal Medicine

## 2023-01-22 ENCOUNTER — Encounter (HOSPITAL_COMMUNITY): Payer: Self-pay | Admitting: Emergency Medicine

## 2023-01-22 DIAGNOSIS — R1310 Dysphagia, unspecified: Secondary | ICD-10-CM | POA: Diagnosis present

## 2023-01-22 DIAGNOSIS — I428 Other cardiomyopathies: Secondary | ICD-10-CM | POA: Diagnosis not present

## 2023-01-22 DIAGNOSIS — Z8674 Personal history of sudden cardiac arrest: Secondary | ICD-10-CM

## 2023-01-22 DIAGNOSIS — F17210 Nicotine dependence, cigarettes, uncomplicated: Secondary | ICD-10-CM | POA: Diagnosis not present

## 2023-01-22 DIAGNOSIS — Z560 Unemployment, unspecified: Secondary | ICD-10-CM

## 2023-01-22 DIAGNOSIS — J690 Pneumonitis due to inhalation of food and vomit: Secondary | ICD-10-CM | POA: Diagnosis not present

## 2023-01-22 DIAGNOSIS — F411 Generalized anxiety disorder: Secondary | ICD-10-CM | POA: Diagnosis present

## 2023-01-22 DIAGNOSIS — I459 Conduction disorder, unspecified: Secondary | ICD-10-CM | POA: Diagnosis present

## 2023-01-22 DIAGNOSIS — R1110 Vomiting, unspecified: Secondary | ICD-10-CM | POA: Diagnosis present

## 2023-01-22 DIAGNOSIS — G7111 Myotonic muscular dystrophy: Secondary | ICD-10-CM | POA: Diagnosis present

## 2023-01-22 DIAGNOSIS — I959 Hypotension, unspecified: Secondary | ICD-10-CM | POA: Diagnosis present

## 2023-01-22 DIAGNOSIS — K219 Gastro-esophageal reflux disease without esophagitis: Secondary | ICD-10-CM | POA: Diagnosis not present

## 2023-01-22 DIAGNOSIS — Z79899 Other long term (current) drug therapy: Secondary | ICD-10-CM | POA: Diagnosis not present

## 2023-01-22 DIAGNOSIS — E890 Postprocedural hypothyroidism: Secondary | ICD-10-CM | POA: Diagnosis not present

## 2023-01-22 DIAGNOSIS — Z681 Body mass index (BMI) 19 or less, adult: Secondary | ICD-10-CM

## 2023-01-22 DIAGNOSIS — I5022 Chronic systolic (congestive) heart failure: Secondary | ICD-10-CM | POA: Diagnosis not present

## 2023-01-22 DIAGNOSIS — E43 Unspecified severe protein-calorie malnutrition: Secondary | ICD-10-CM | POA: Diagnosis present

## 2023-01-22 DIAGNOSIS — D573 Sickle-cell trait: Secondary | ICD-10-CM | POA: Diagnosis present

## 2023-01-22 DIAGNOSIS — R000 Tachycardia, unspecified: Secondary | ICD-10-CM | POA: Diagnosis not present

## 2023-01-22 DIAGNOSIS — Z8249 Family history of ischemic heart disease and other diseases of the circulatory system: Secondary | ICD-10-CM

## 2023-01-22 DIAGNOSIS — Z833 Family history of diabetes mellitus: Secondary | ICD-10-CM

## 2023-01-22 DIAGNOSIS — A09 Infectious gastroenteritis and colitis, unspecified: Secondary | ICD-10-CM | POA: Diagnosis present

## 2023-01-22 DIAGNOSIS — Z950 Presence of cardiac pacemaker: Secondary | ICD-10-CM | POA: Diagnosis not present

## 2023-01-22 DIAGNOSIS — R197 Diarrhea, unspecified: Secondary | ICD-10-CM | POA: Diagnosis present

## 2023-01-22 DIAGNOSIS — Z79890 Hormone replacement therapy (postmenopausal): Secondary | ICD-10-CM

## 2023-01-22 DIAGNOSIS — J9611 Chronic respiratory failure with hypoxia: Secondary | ICD-10-CM | POA: Diagnosis not present

## 2023-01-22 DIAGNOSIS — R6510 Systemic inflammatory response syndrome (SIRS) of non-infectious origin without acute organ dysfunction: Secondary | ICD-10-CM | POA: Diagnosis present

## 2023-01-22 DIAGNOSIS — A419 Sepsis, unspecified organism: Principal | ICD-10-CM

## 2023-01-22 DIAGNOSIS — Z825 Family history of asthma and other chronic lower respiratory diseases: Secondary | ICD-10-CM | POA: Diagnosis not present

## 2023-01-22 DIAGNOSIS — R531 Weakness: Secondary | ICD-10-CM | POA: Diagnosis not present

## 2023-01-22 DIAGNOSIS — R109 Unspecified abdominal pain: Secondary | ICD-10-CM | POA: Diagnosis not present

## 2023-01-22 DIAGNOSIS — R059 Cough, unspecified: Secondary | ICD-10-CM | POA: Diagnosis not present

## 2023-01-22 DIAGNOSIS — E039 Hypothyroidism, unspecified: Secondary | ICD-10-CM | POA: Diagnosis present

## 2023-01-22 DIAGNOSIS — I447 Left bundle-branch block, unspecified: Secondary | ICD-10-CM | POA: Diagnosis present

## 2023-01-22 LAB — COMPREHENSIVE METABOLIC PANEL
ALT: 39 U/L (ref 0–44)
AST: 24 U/L (ref 15–41)
Albumin: 2.6 g/dL — ABNORMAL LOW (ref 3.5–5.0)
Alkaline Phosphatase: 71 U/L (ref 38–126)
Anion gap: 11 (ref 5–15)
BUN: 12 mg/dL (ref 6–20)
CO2: 25 mmol/L (ref 22–32)
Calcium: 8.9 mg/dL (ref 8.9–10.3)
Chloride: 100 mmol/L (ref 98–111)
Creatinine, Ser: 0.55 mg/dL (ref 0.44–1.00)
GFR, Estimated: >60 mL/min (ref >60)
Glucose, Bld: 120 mg/dL — ABNORMAL HIGH (ref 70–99)
Potassium: 4 mmol/L (ref 3.5–5.1)
Sodium: 136 mmol/L (ref 135–145)
Total Bilirubin: 0.4 mg/dL (ref 0.3–1.2)
Total Protein: 7.5 g/dL (ref 6.5–8.1)

## 2023-01-22 LAB — CBC
HCT: 38.6 % (ref 36.0–46.0)
Hemoglobin: 12.1 g/dL (ref 12.0–15.0)
MCH: 31.1 pg (ref 26.0–34.0)
MCHC: 31.3 g/dL (ref 30.0–36.0)
MCV: 99.2 fL (ref 80.0–100.0)
Platelets: 255 10*3/uL (ref 150–400)
RBC: 3.89 MIL/uL (ref 3.87–5.11)
RDW: 16 % — ABNORMAL HIGH (ref 11.5–15.5)
WBC: 20 10*3/uL — ABNORMAL HIGH (ref 4.0–10.5)
nRBC: 0 % (ref 0.0–0.2)

## 2023-01-22 LAB — LACTIC ACID, PLASMA: Lactic Acid, Venous: 1.3 mmol/L (ref 0.5–1.9)

## 2023-01-22 LAB — LIPASE, BLOOD: Lipase: 27 U/L (ref 11–51)

## 2023-01-22 NOTE — ED Notes (Signed)
Cousin states pt got 500mg  tylenol at 6:45p for possible fever

## 2023-01-22 NOTE — ED Triage Notes (Addendum)
Pt in with weakness, diarrhea and hypotension, arrives with cousin who provides further details. Pt was recently hospitalized for hypoxia r/t PNA, had Vfib arrest and was kept in hospital for >20 days, released from [facility] 5/17. Pt has been having diarrhea since yesterday, reporting at least 5 episodes, and this happens after every tube feeding. Recently had PEG placed, pt is NPO. EMS was called out to house today, and they gave 1L LR - initial pressure there 80/60's. Pt weak and lethargic in triage, but oriented x 4. BP 100/67 HR 99

## 2023-01-22 NOTE — Telephone Encounter (Signed)
Summary: Diarrhea   Cynthia Underwood, a relative of the pt is calling in because pt is having diarrhea and [name] wants to speak with a nurse regarding what the pt can take to help it.        Called [facility] - left message on machine to return call. Gave community line phone number.

## 2023-01-22 NOTE — ED Notes (Signed)
1 set of blood cx sent to lab

## 2023-01-22 NOTE — ED Provider Notes (Signed)
[facility] Emergency Department Provider Note MRN:  [id number]  Arrival date & time: 01/23/23     Chief Complaint   Weakness and Diarrhea   History of Present Illness   Cynthia Underwood is a 52 y.o. year-old female with a history of myotonic dystrophy, cardiomyopathy presenting to the ED with chief complaint of weakness and diarrhea.  Patient had a recent extended hospital stay for V-fib arrest.  Has been home from the rehab facility for a day or 2.  Started having persistent watery diarrhea today.  Generalized weakness.  EMS found patient to be hypotensive, liter of fluid given.  Patient without complaints at this time.  Review of Systems  A thorough review of systems was obtained and all systems are negative except as noted in the HPI and PMH.   Patient's Health History    Past Medical History:  Diagnosis Date   Bifascicular block 10/02/2014   Cataract    Dyspnea    Excess ear wax    Multinodular goiter    Pneumonia    Presence of permanent cardiac pacemaker    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   Sickle cell trait (HCC)    Watery eyes    Left   Weakness of both legs     Past Surgical History:  Procedure Laterality Date   BREAST BIOPSY Right    EYE SURGERY Left 2017   "related to blockage in my nose"   EYE SURGERY Right 05/2019   INSERT / REPLACE / REMOVE PACEMAKER  02/15/2017   IR GASTROSTOMY TUBE MOD SED  01/01/2023   PACEMAKER IMPLANT N/A 02/15/2017   Procedure: Pacemaker Implant;  Surgeon: Duke Salvia, MD;  Location: [facility] INVASIVE CV LAB;  Service: Cardiovascular;  Laterality: N/A;   RIGHT/LEFT HEART CATH AND CORONARY ANGIOGRAPHY N/A 11/16/2022   Procedure: RIGHT/LEFT HEART CATH AND CORONARY ANGIOGRAPHY;  Surgeon: Laurey Morale, MD;  Location: [facility] INVASIVE CV LAB;  Service: Cardiovascular;  Laterality: N/A;   THYROIDECTOMY N/A 04/21/2021   Procedure: TOTAL THYROIDECTOMY;  Surgeon: Axel Filler, MD;  Location: [facility] OR;  Service: General;  Laterality: N/A;    Family History  Problem Relation Age of Onset   Cancer Father 56       Deceased   Heart disease Father    COPD Mother    Diabetes Maternal Uncle    Heart disease Maternal Uncle    Heart disease Paternal Grandmother    Diabetes Paternal Grandmother    Hypertension Paternal Grandmother    Neuromuscular disorder Maternal Aunt        Myotonic dystrophy   Neuromuscular disorder Cousin        Myotonic dystrophy   Neuromuscular disorder Sister        Myotonic dystrophy   Colon cancer Neg Hx    Colon polyps Neg Hx    Esophageal cancer Neg Hx    Rectal cancer Neg Hx    Stomach cancer Neg Hx     Social History   Socioeconomic History   Marital status: Single    Spouse name: Not on file   Number of children: 0   Years of education: 1.5 Collge   Highest education level: Not on file  Occupational History   Occupation: Unemployed    Employer: OTHER  Tobacco Use   Smoking status: Every Day    Years: 28    Types: Cigarettes    Last attempt to quit: 10/04/2016  Years since quitting: 6.3   Smokeless tobacco: Never   Tobacco comments:    3 cigarettes a day  Vaping Use   Vaping Use: Never used  Substance and Sexual Activity   Alcohol use: Not Currently    Alcohol/week: 0.0 standard drinks of alcohol    Comment:  few times a year   Drug use: Not Currently    Types: Marijuana    Comment: occ    Sexual activity: Not Currently    Birth control/protection: None, Condom  Other Topics Concern   Not on file  Social History Narrative   Patient lives at home with her aunt.   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  She moved to [facility] in August 2015.   She has been on disability since 2010.   Education: 1 1/2 years of college.   Caffeine - coke 2 cans/day   Exercise - no   Social Determinants of Health   Financial Resource Strain: Low Risk  (10/16/2022)   Overall Financial Resource Strain  (CARDIA)    Difficulty of Paying Living Expenses: Not hard at all  Food Insecurity: No Food Insecurity (10/16/2022)   Hunger Vital Sign    Worried About Running Out of Food in the Last Year: Never true    Ran Out of Food in the Last Year: Never true  Transportation Needs: No Transportation Needs (10/16/2022)   PRAPARE - Administrator, Civil Service (Medical): No    Lack of Transportation (Non-Medical): No  Physical Activity: Inactive (10/16/2022)   Exercise Vital Sign    Days of Exercise per Week: 0 days    Minutes of Exercise per Session: 0 min  Stress: Stress Concern Present (10/16/2022)   [employer] of Occupational Health - Occupational Stress Questionnaire    Feeling of Stress : To some extent  Social Connections: Socially Isolated (10/16/2022)   Social Connection and Isolation Panel [NHANES]    Frequency of Communication with Friends and Family: Three times a week    Frequency of Social Gatherings with Friends and Family: Three times a week    Attends Religious Services: Never    Active Member of Clubs or Organizations: No    Attends Banker Meetings: Never    Marital Status: Never married  Intimate Partner Violence: Not At Risk (10/16/2022)   Humiliation, Afraid, Rape, and Kick questionnaire    Fear of Current or Ex-Partner: No    Emotionally Abused: No    Physically Abused: No    Sexually Abused: No     Physical Exam   Vitals:   01/23/23 0130 01/23/23 0248  BP: 101/71   Pulse: (!) 116   Resp: (!) 23   Temp:  (!) 100.4 F (38 C)  SpO2: 99%     CONSTITUTIONAL: Chronically ill-appearing, NAD NEURO/PSYCH:  Alert and oriented x 3, no focal deficits EYES:  eyes equal and reactive ENT/NECK:  no LAD, no JVD CARDIO: Regular rate, well-perfused, normal S1 and S2 PULM:  CTAB no wheezing or rhonchi GI/GU:  non-distended, non-tender MSK/SPINE:  No gross deformities, no edema SKIN:  no rash, atraumatic   *Additional and/or pertinent findings  included in MDM below  Diagnostic and Interventional Summary    EKG Interpretation  Date/Time:  Tuesday Jan 23 2023 00:37:45 EDT Ventricular Rate:  97 PR Interval:  160 QRS Duration: 179 QT Interval:  409 QTC Calculation: 520 R Axis:   -46 Text Interpretation: Sinus or ectopic atrial rhythm Left  bundle branch block Confirmed by Kennis Carina [phone]) on 01/23/2023 1:26:22 AM       Labs Reviewed  COMPREHENSIVE METABOLIC PANEL - Abnormal; Notable for the following components:      Result Value   Glucose, Bld 120 (*)    Albumin 2.6 (*)    All other components within normal limits  CBC - Abnormal; Notable for the following components:   WBC 20.0 (*)    RDW 16.0 (*)    All other components within normal limits  CULTURE, BLOOD (ROUTINE X 2)  CULTURE, BLOOD (ROUTINE X 2)  GASTROINTESTINAL PANEL BY PCR, STOOL (REPLACES STOOL CULTURE)  C DIFFICILE QUICK SCREEN W PCR REFLEX    LIPASE, BLOOD  LACTIC ACID, PLASMA  DIGOXIN LEVEL  URINALYSIS, ROUTINE W REFLEX MICROSCOPIC  LACTIC ACID, PLASMA    CT ABDOMEN PELVIS W CONTRAST  Final Result    DG Chest 2 View  Final Result      Medications  cefTRIAXone (ROCEPHIN) 2 g in sodium chloride 0.9 % 100 mL IVPB (2 g Intravenous New Bag/Given 01/23/23 0250)  metroNIDAZOLE (FLAGYL) IVPB 500 mg (500 mg Intravenous New Bag/Given 01/23/23 0251)  iohexol (OMNIPAQUE) 350 MG/ML injection 75 mL (75 mLs Intravenous Contrast Given 01/23/23 0233)  acetaminophen (TYLENOL) suppository 650 mg (650 mg Rectal Given 01/23/23 0251)     Procedures  /  Critical Care .Critical Care  Performed by: Sabas Sous, MD Authorized by: Sabas Sous, MD   Critical care provider statement:    Critical care time (minutes):  35   Critical care was necessary to treat or prevent imminent or life-threatening deterioration of the following conditions:  Sepsis   Critical care was time spent personally by me on the following activities:  Development of treatment plan with  patient or surrogate, discussions with consultants, evaluation of patient's response to treatment, examination of patient, ordering and review of laboratory studies, ordering and review of radiographic studies, ordering and performing treatments and interventions, pulse oximetry, re-evaluation of patient's condition and review of old charts   ED Course and Medical Decision Making  Initial Impression and Ddx Hypotension and weakness in the setting of persistent diarrhea.  Extensive past medical history with the myotonic dystrophy, recent V-fib arrest.  The liter of fluids on the way here seems to have improved patient's vital signs.  She has a soft abdomen, clear lungs.  Seems to be at her baseline at the moment.  Patient's cousin is a first responder EMS personnel at bedside who was very helpful.  Awaiting laboratory assessment, will send stool to the lab if possible.  Given the recent hospitalization and antibiotics, C. difficile is considered.  Past medical/surgical history that increases complexity of ED encounter: V-fib arrest, myotonic dystrophy, CHF  Interpretation of Diagnostics I personally reviewed the EKG and my interpretation is as follows: Sinus rhythm, left bundle branch block  Labs reveal prominent leukocytosis otherwise no significant blood count or electrolyte disturbance  Patient Reassessment and Ultimate Disposition/Management     Patient has spiked a fever, tachycardia has returned as well.  Concern for underlying sepsis or infectious process.  CT abdomen is without significant findings.  Will admit to medicine.  Patient management required discussion with the following services or consulting groups:  Hospitalist Service  Complexity of Problems Addressed Acute illness or injury that poses threat of life of bodily function  Additional Data Reviewed and Analyzed Further history obtained from: Further history from spouse/family member  Additional Factors Impacting ED  Encounter  Risk Consideration of hospitalization  Elmer Sow. Pilar Plate, MD [facility] Emergency Medicine [facility] [email or web address]  Final Clinical Impressions(s) / ED Diagnoses     ICD-10-CM   1. Sepsis, due to unspecified organism, unspecified whether acute organ dysfunction present (HCC)  A41.9     2. Diarrhea of infectious origin  A09       ED Discharge Orders     None        Discharge Instructions Discussed with and Provided to Patient:   Discharge Instructions   None      Sabas Sous, MD 01/23/23 [phone]

## 2023-01-23 ENCOUNTER — Emergency Department (HOSPITAL_COMMUNITY): Payer: 59

## 2023-01-23 ENCOUNTER — Encounter (HOSPITAL_COMMUNITY): Payer: Self-pay | Admitting: Internal Medicine

## 2023-01-23 DIAGNOSIS — Z560 Unemployment, unspecified: Secondary | ICD-10-CM | POA: Diagnosis not present

## 2023-01-23 DIAGNOSIS — R197 Diarrhea, unspecified: Secondary | ICD-10-CM

## 2023-01-23 DIAGNOSIS — Z681 Body mass index (BMI) 19 or less, adult: Secondary | ICD-10-CM | POA: Diagnosis not present

## 2023-01-23 DIAGNOSIS — R6510 Systemic inflammatory response syndrome (SIRS) of non-infectious origin without acute organ dysfunction: Secondary | ICD-10-CM

## 2023-01-23 DIAGNOSIS — G7111 Myotonic muscular dystrophy: Secondary | ICD-10-CM

## 2023-01-23 DIAGNOSIS — Z8249 Family history of ischemic heart disease and other diseases of the circulatory system: Secondary | ICD-10-CM | POA: Diagnosis not present

## 2023-01-23 DIAGNOSIS — F411 Generalized anxiety disorder: Secondary | ICD-10-CM

## 2023-01-23 DIAGNOSIS — K219 Gastro-esophageal reflux disease without esophagitis: Secondary | ICD-10-CM | POA: Diagnosis not present

## 2023-01-23 DIAGNOSIS — Z79899 Other long term (current) drug therapy: Secondary | ICD-10-CM | POA: Diagnosis not present

## 2023-01-23 DIAGNOSIS — Z825 Family history of asthma and other chronic lower respiratory diseases: Secondary | ICD-10-CM | POA: Diagnosis not present

## 2023-01-23 DIAGNOSIS — E890 Postprocedural hypothyroidism: Secondary | ICD-10-CM

## 2023-01-23 DIAGNOSIS — E43 Unspecified severe protein-calorie malnutrition: Secondary | ICD-10-CM

## 2023-01-23 DIAGNOSIS — Z8674 Personal history of sudden cardiac arrest: Secondary | ICD-10-CM | POA: Diagnosis not present

## 2023-01-23 DIAGNOSIS — Z743 Need for continuous supervision: Secondary | ICD-10-CM | POA: Diagnosis not present

## 2023-01-23 DIAGNOSIS — J90 Pleural effusion, not elsewhere classified: Secondary | ICD-10-CM | POA: Diagnosis not present

## 2023-01-23 DIAGNOSIS — I429 Cardiomyopathy, unspecified: Secondary | ICD-10-CM | POA: Diagnosis not present

## 2023-01-23 DIAGNOSIS — I4901 Ventricular fibrillation: Secondary | ICD-10-CM | POA: Diagnosis not present

## 2023-01-23 DIAGNOSIS — Z7401 Bed confinement status: Secondary | ICD-10-CM | POA: Diagnosis not present

## 2023-01-23 DIAGNOSIS — I459 Conduction disorder, unspecified: Secondary | ICD-10-CM | POA: Diagnosis present

## 2023-01-23 DIAGNOSIS — T189XXA Foreign body of alimentary tract, part unspecified, initial encounter: Secondary | ICD-10-CM | POA: Diagnosis not present

## 2023-01-23 DIAGNOSIS — Z950 Presence of cardiac pacemaker: Secondary | ICD-10-CM | POA: Diagnosis not present

## 2023-01-23 DIAGNOSIS — R1312 Dysphagia, oropharyngeal phase: Secondary | ICD-10-CM | POA: Diagnosis not present

## 2023-01-23 DIAGNOSIS — Z833 Family history of diabetes mellitus: Secondary | ICD-10-CM | POA: Diagnosis not present

## 2023-01-23 DIAGNOSIS — Z931 Gastrostomy status: Secondary | ICD-10-CM | POA: Diagnosis not present

## 2023-01-23 DIAGNOSIS — J9611 Chronic respiratory failure with hypoxia: Secondary | ICD-10-CM

## 2023-01-23 DIAGNOSIS — I455 Other specified heart block: Secondary | ICD-10-CM | POA: Diagnosis not present

## 2023-01-23 DIAGNOSIS — K649 Unspecified hemorrhoids: Secondary | ICD-10-CM | POA: Diagnosis not present

## 2023-01-23 DIAGNOSIS — M6281 Muscle weakness (generalized): Secondary | ICD-10-CM | POA: Diagnosis not present

## 2023-01-23 DIAGNOSIS — J9819 Other pulmonary collapse: Secondary | ICD-10-CM | POA: Diagnosis not present

## 2023-01-23 DIAGNOSIS — I5022 Chronic systolic (congestive) heart failure: Secondary | ICD-10-CM

## 2023-01-23 DIAGNOSIS — D649 Anemia, unspecified: Secondary | ICD-10-CM | POA: Diagnosis not present

## 2023-01-23 DIAGNOSIS — R1310 Dysphagia, unspecified: Secondary | ICD-10-CM | POA: Diagnosis not present

## 2023-01-23 DIAGNOSIS — D573 Sickle-cell trait: Secondary | ICD-10-CM | POA: Diagnosis present

## 2023-01-23 DIAGNOSIS — F32A Depression, unspecified: Secondary | ICD-10-CM | POA: Diagnosis not present

## 2023-01-23 DIAGNOSIS — R0603 Acute respiratory distress: Secondary | ICD-10-CM | POA: Diagnosis not present

## 2023-01-23 DIAGNOSIS — I428 Other cardiomyopathies: Secondary | ICD-10-CM | POA: Diagnosis present

## 2023-01-23 DIAGNOSIS — A09 Infectious gastroenteritis and colitis, unspecified: Secondary | ICD-10-CM | POA: Diagnosis present

## 2023-01-23 DIAGNOSIS — Z79890 Hormone replacement therapy (postmenopausal): Secondary | ICD-10-CM | POA: Diagnosis not present

## 2023-01-23 DIAGNOSIS — J690 Pneumonitis due to inhalation of food and vomit: Secondary | ICD-10-CM | POA: Diagnosis not present

## 2023-01-23 DIAGNOSIS — F17210 Nicotine dependence, cigarettes, uncomplicated: Secondary | ICD-10-CM | POA: Diagnosis present

## 2023-01-23 DIAGNOSIS — I959 Hypotension, unspecified: Secondary | ICD-10-CM | POA: Diagnosis present

## 2023-01-23 DIAGNOSIS — R109 Unspecified abdominal pain: Secondary | ICD-10-CM | POA: Diagnosis not present

## 2023-01-23 DIAGNOSIS — H269 Unspecified cataract: Secondary | ICD-10-CM | POA: Diagnosis not present

## 2023-01-23 DIAGNOSIS — R0602 Shortness of breath: Secondary | ICD-10-CM | POA: Diagnosis not present

## 2023-01-23 LAB — URINALYSIS, ROUTINE W REFLEX MICROSCOPIC
Bacteria, UA: NONE SEEN
Bilirubin Urine: NEGATIVE
Glucose, UA: NEGATIVE mg/dL
Hgb urine dipstick: NEGATIVE
Ketones, ur: NEGATIVE mg/dL
Leukocytes,Ua: NEGATIVE
Nitrite: NEGATIVE
Protein, ur: 30 mg/dL — AB
Specific Gravity, Urine: >1.046 — ABNORMAL HIGH (ref 1.005–1.030)
pH: 5 (ref 5.0–8.0)

## 2023-01-23 LAB — PROCALCITONIN: Procalcitonin: 0.11 ng/mL

## 2023-01-23 LAB — DIGOXIN LEVEL: Digoxin Level: 0.9 ng/mL (ref 0.8–2.0)

## 2023-01-23 LAB — EXPECTORATED SPUTUM ASSESSMENT W GRAM STAIN, RFLX TO RESP C

## 2023-01-23 LAB — PREALBUMIN: Prealbumin: 13 mg/dL — ABNORMAL LOW (ref 18–38)

## 2023-01-23 LAB — GLUCOSE, CAPILLARY: Glucose-Capillary: 77 mg/dL (ref 70–99)

## 2023-01-23 LAB — CULTURE, BLOOD (ROUTINE X 2)

## 2023-01-23 MED ORDER — LEVOTHYROXINE SODIUM 112 MCG PO TABS
112.0000 ug | ORAL_TABLET | Freq: Every day | ORAL | Status: DC
  Administered 2023-01-24 – 2023-01-30 (×7): 112 ug
  Filled 2023-01-23 (×7): qty 1

## 2023-01-23 MED ORDER — SODIUM CHLORIDE 0.9 % IV SOLN
2.0000 g | INTRAVENOUS | Status: DC
  Administered 2023-01-24 – 2023-01-29 (×7): 2 g via INTRAVENOUS
  Filled 2023-01-23 (×7): qty 20

## 2023-01-23 MED ORDER — DIGOXIN 125 MCG PO TABS
0.2500 mg | ORAL_TABLET | Freq: Every day | ORAL | Status: DC
  Administered 2023-01-23 – 2023-01-30 (×8): 0.25 mg
  Filled 2023-01-23 (×8): qty 2

## 2023-01-23 MED ORDER — ALBUTEROL SULFATE (2.5 MG/3ML) 0.083% IN NEBU: 2.5000 mg | INHALATION_SOLUTION | RESPIRATORY_TRACT | Status: DC | PRN

## 2023-01-23 MED ORDER — METOPROLOL TARTRATE 12.5 MG HALF TABLET
12.5000 mg | ORAL_TABLET | Freq: Two times a day (BID) | ORAL | Status: DC
  Administered 2023-01-23 – 2023-01-30 (×10): 12.5 mg
  Filled 2023-01-23 (×12): qty 1

## 2023-01-23 MED ORDER — GUAIFENESIN 200 MG PO TABS
400.0000 mg | ORAL_TABLET | Freq: Three times a day (TID) | ORAL | Status: DC
  Administered 2023-01-23 – 2023-01-30 (×21): 400 mg via ORAL
  Filled 2023-01-23 (×21): qty 2

## 2023-01-23 MED ORDER — ACETAMINOPHEN 500 MG PO TABS
1000.0000 mg | ORAL_TABLET | Freq: Once | ORAL | Status: DC
  Filled 2023-01-23: qty 2

## 2023-01-23 MED ORDER — IOHEXOL 350 MG/ML SOLN
75.0000 mL | Freq: Once | INTRAVENOUS | Status: AC | PRN
  Administered 2023-01-23: 75 mL via INTRAVENOUS

## 2023-01-23 MED ORDER — ENOXAPARIN SODIUM 40 MG/0.4ML IJ SOSY
40.0000 mg | PREFILLED_SYRINGE | INTRAMUSCULAR | Status: DC
  Administered 2023-01-23 – 2023-01-30 (×8): 40 mg via SUBCUTANEOUS
  Filled 2023-01-23 (×8): qty 0.4

## 2023-01-23 MED ORDER — QUETIAPINE FUMARATE 25 MG PO TABS
25.0000 mg | ORAL_TABLET | Freq: Every day | ORAL | Status: DC
  Administered 2023-01-23 – 2023-01-29 (×7): 25 mg
  Filled 2023-01-23 (×7): qty 1

## 2023-01-23 MED ORDER — SODIUM CHLORIDE 0.9% FLUSH
3.0000 mL | Freq: Two times a day (BID) | INTRAVENOUS | Status: DC
  Administered 2023-01-23 – 2023-01-30 (×14): 3 mL via INTRAVENOUS

## 2023-01-23 MED ORDER — SPIRONOLACTONE 12.5 MG HALF TABLET
12.5000 mg | ORAL_TABLET | Freq: Every day | ORAL | Status: DC
  Administered 2023-01-24 – 2023-01-30 (×7): 12.5 mg
  Filled 2023-01-23 (×7): qty 1

## 2023-01-23 MED ORDER — METOCLOPRAMIDE HCL 10 MG PO TABS
5.0000 mg | ORAL_TABLET | Freq: Two times a day (BID) | ORAL | Status: DC
  Administered 2023-01-23 – 2023-01-30 (×14): 5 mg
  Filled 2023-01-23 (×14): qty 1

## 2023-01-23 MED ORDER — SODIUM CHLORIDE 0.9 % IV SOLN
100.0000 mg | Freq: Two times a day (BID) | INTRAVENOUS | Status: DC
  Administered 2023-01-24: 100 mg via INTRAVENOUS
  Filled 2023-01-23 (×3): qty 100

## 2023-01-23 MED ORDER — SODIUM CHLORIDE 0.9 % IV SOLN
2.0000 g | Freq: Once | INTRAVENOUS | Status: AC
  Administered 2023-01-23: 2 g via INTRAVENOUS
  Filled 2023-01-23: qty 20

## 2023-01-23 MED ORDER — METRONIDAZOLE 500 MG/100ML IV SOLN
500.0000 mg | Freq: Once | INTRAVENOUS | Status: AC
  Administered 2023-01-23: 500 mg via INTRAVENOUS
  Filled 2023-01-23: qty 100

## 2023-01-23 MED ORDER — ACETAMINOPHEN 650 MG RE SUPP: 650.0000 mg | Freq: Four times a day (QID) | RECTAL | Status: DC | PRN

## 2023-01-23 MED ORDER — ACETAMINOPHEN 325 MG PO TABS
650.0000 mg | ORAL_TABLET | Freq: Four times a day (QID) | ORAL | Status: DC | PRN
  Administered 2023-01-25: 650 mg
  Filled 2023-01-23 (×2): qty 2

## 2023-01-23 MED ORDER — ACETAMINOPHEN 650 MG RE SUPP
650.0000 mg | Freq: Once | RECTAL | Status: AC
  Administered 2023-01-23: 650 mg via RECTAL
  Filled 2023-01-23: qty 1

## 2023-01-23 MED ORDER — OSMOLITE 1.2 CAL PO LIQD
1000.0000 mL | ORAL | Status: DC
  Administered 2023-01-23: 1000 mL
  Filled 2023-01-23 (×2): qty 1000

## 2023-01-23 MED ORDER — PANTOPRAZOLE SODIUM 40 MG PO TBEC
40.0000 mg | DELAYED_RELEASE_TABLET | Freq: Every day | ORAL | Status: DC
  Administered 2023-01-23 – 2023-01-30 (×8): 40 mg via ORAL
  Filled 2023-01-23 (×8): qty 1

## 2023-01-23 MED ORDER — SACCHAROMYCES BOULARDII 250 MG PO CAPS
250.0000 mg | ORAL_CAPSULE | Freq: Two times a day (BID) | ORAL | Status: DC
  Administered 2023-01-23 – 2023-01-30 (×14): 250 mg
  Filled 2023-01-23 (×14): qty 1

## 2023-01-23 NOTE — ED Notes (Signed)
Patient transported to CT 

## 2023-01-23 NOTE — H&P (Signed)
History and Physical    Patient: [name]:[id number] DOB: March 17, 1971 DOA: 01/22/2023 DOS: the patient was seen and examined on 01/23/2023 PCP: Shayne Alken, MD  Patient coming from: Home via EMS  Chief Complaint:  Chief Complaint  Patient presents with   Weakness   Diarrhea   HPI: KADIJATU Underwood is a 52 y.o. female with medical history significant of myotonic dystrophy, hypothyroidism, anxiety, heart block with pacer, HFrEF, and admission in 11/2022 for pneumonia with hypoxic respiratory failure failure that was complicated by VF arrest who now presents with complaints of diarrhea and fevers over the last 2 days.  After getting discharged from the hospital in March she had been sent to the [facility] and was just discharged on 5/17.  At home patient reported having subjective fevers, but when they were checked her temperature was no higher than 99.9 F.  She reported having 4-5 episodes of diarrhea while at home the other day.  Denies having any abdominal pain or dysuria. Associated symptoms include a productive cough with greenish-yellow sputum.  In route with EMS initial blood pressure noted to be 80 over 60s.  Patient had been given 1 L of lactated ringers.  In the emergency department patient was noted to be febrile up to 100.4 F with tachycardia and tachypnea meeting SIRS criteria.  Labs significant for WBC 20 with initial lactic acid 1.3.  Chest x-ray noted small right-sided pleural effusion and worsening bilateral lung airspace disease compared to prior.  CT scan of the abdomen pelvis did not note any acute intra-abdominal pathology, 2 cm linear metallic density intraluminally within the bowel of the right hemipelvis without surrounding inflammation or findings for perforation, persistent dense collapse and consolidation in the lower lobes bilaterally with multifocal airway impaction and collapse.  Urinalysis noted no significant signs of infection,  but had elevated specific gravity of greater than 1.046.  Blood cultures have been obtained.  Patient has been given 650 mg of acetaminophen rectally, Rocephin, and metronidazole.  Review of Systems: As mentioned in the history of present illness. All other systems reviewed and are negative. Past Medical History:  Diagnosis Date   Bifascicular block 10/02/2014   Cataract    Dyspnea    Excess ear wax    Multinodular goiter    Pneumonia    Presence of permanent cardiac pacemaker    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   Sickle cell trait (HCC)    Watery eyes    Left   Weakness of both legs    Past Surgical History:  Procedure Laterality Date   BREAST BIOPSY Right    EYE SURGERY Left 2017   "related to blockage in my nose"   EYE SURGERY Right 05/2019   INSERT / REPLACE / REMOVE PACEMAKER  02/15/2017   IR GASTROSTOMY TUBE MOD SED  01/01/2023   PACEMAKER IMPLANT N/A 02/15/2017   Procedure: Pacemaker Implant;  Surgeon: Duke Salvia, MD;  Location: [facility] INVASIVE CV LAB;  Service: Cardiovascular;  Laterality: N/A;   RIGHT/LEFT HEART CATH AND CORONARY ANGIOGRAPHY N/A 11/16/2022   Procedure: RIGHT/LEFT HEART CATH AND CORONARY ANGIOGRAPHY;  Surgeon: Laurey Morale, MD;  Location: [facility] INVASIVE CV LAB;  Service: Cardiovascular;  Laterality: N/A;   THYROIDECTOMY N/A 04/21/2021   Procedure: TOTAL THYROIDECTOMY;  Surgeon: Axel Filler, MD;  Location: [facility] OR;  Service: General;  Laterality: N/A;   Social History:  reports that she has been smoking cigarettes. She has  never used smokeless tobacco. She reports that she does not currently use alcohol. She reports that she does not currently use drugs after having used the following drugs: Marijuana.  Allergies  Allergen Reactions   Penicillin G Other (See Comments), Rash and Nausea And Vomiting    Sore throat   Penicillins Nausea And Vomiting, Rash and Other (See Comments)    Fever Has patient had a  PCN reaction causing immediate rash, facial/tongue/throat swelling, SOB or lightheadedness with hypotension: Yes Has patient had a PCN reaction causing severe rash involving mucus membranes or skin necrosis: Unknown Has patient had a PCN reaction that required hospitalization: No Has patient had a PCN reaction occurring within the last 10 years: No If all of the above answers are "NO", then may proceed with Cephalosporin use.     Family History  Problem Relation Age of Onset   Cancer Father 62       Deceased   Heart disease Father    COPD Mother    Diabetes Maternal Uncle    Heart disease Maternal Uncle    Heart disease Paternal Grandmother    Diabetes Paternal Grandmother    Hypertension Paternal Grandmother    Neuromuscular disorder Maternal Aunt        Myotonic dystrophy   Neuromuscular disorder Cousin        Myotonic dystrophy   Neuromuscular disorder Sister        Myotonic dystrophy   Colon cancer Neg Hx    Colon polyps Neg Hx    Esophageal cancer Neg Hx    Rectal cancer Neg Hx    Stomach cancer Neg Hx     Prior to Admission medications   Medication Sig Start Date End Date Taking? Authorizing Provider  acetaminophen (TYLENOL) 500 MG tablet Take 500 mg by mouth every 6 (six) hours as needed for mild pain.   Yes [provider]  digoxin (LANOXIN) 0.25 MG tablet Place 0.25 mg into feeding tube daily. 01/19/23 03/20/23 Yes [provider]  guaifenesin (HUMIBID E) 400 MG TABS tablet Take 400 mg by mouth 3 (three) times daily. 01/19/23  Yes [provider]  levothyroxine (SYNTHROID) 112 MCG tablet Place 1 tablet (112 mcg total) into feeding tube daily before breakfast. 12/07/22  Yes Doran Stabler, DO  metoCLOPramide (REGLAN) 5 MG tablet Place 5 mg into feeding tube. 01/19/23  Yes [provider]  metoprolol tartrate (LOPRESSOR) 25 MG tablet Place 12.5 mg into feeding tube 2 (two) times daily. 01/19/23  Yes [provider]  Nutritional  Supplements (FEEDING SUPPLEMENT, OSMOLITE 1.5 CAL,) LIQD Place 1,000 mLs into feeding tube continuous. 12/06/22  Yes Doran Stabler, DO  pantoprazole (PROTONIX) 40 MG tablet Take 40 mg by mouth daily.   Yes [provider]  QUEtiapine (SEROQUEL) 25 MG tablet Place 1 tablet (25 mg total) into feeding tube at bedtime. 12/06/22  Yes Doran Stabler, DO  scopolamine (TRANSDERM-SCOP) 1 MG/3DAYS Place 1 patch onto the skin every 3 (three) days. 01/22/23  Yes [provider]  spironolactone (ALDACTONE) 25 MG tablet Place 0.5 tablets (12.5 mg total) into feeding tube daily. 12/07/22  Yes Doran Stabler, DO  clonazePAM (KLONOPIN) 1 MG tablet Place 1 tablet (1 mg total) into feeding tube every 8 (eight) hours. Patient not taking: Reported on 01/22/2023 12/06/22   Doran Stabler, DO  pantoprazole (PROTONIX) 40 MG injection Inject 40 mg into the vein at bedtime. Patient not taking: Reported on 01/22/2023 12/06/22   Doran Stabler, DO  Protein (FEEDING  SUPPLEMENT, PROSOURCE TF20,) liquid Place 60 mLs into feeding tube daily. Patient not taking: Reported on 01/22/2023 12/07/22   Doran Stabler, DO  sertraline (ZOLOFT) 25 MG tablet Place 3 tablets (75 mg total) into feeding tube daily. Patient not taking: Reported on 01/22/2023 12/07/22   Doran Stabler, DO    Physical Exam: Vitals:   01/23/23 0615 01/23/23 0645 01/23/23 0651 01/23/23 0700  BP: 96/64 99/61  96/60  Pulse: 87 71  70  Resp: 17 (!) 21  17  Temp:   97.9 F (36.6 C)   TempSrc:   Oral   SpO2: 96% 100%  100%  Weight:       Constitutional: Middle-aged female who appears to be in no acute distress at this time Eyes: PERRL, lids and conjunctivae normal ENMT: Mucous membranes are moist.  Neck: normal, supple, healed tracheostomy scar. Respiratory: Decreased overall aeration with no significant wheezes.  O2 saturation currently maintained on 2 L of nasal cannula oxygen. Cardiovascular: Regular rate and rhythm, no murmurs / rubs / gallops. No extremity edema. 2+  pedal pulses. No carotid bruits.  Abdomen: no tenderness, no masses palpated.  PEG tube in place.  Bowel sounds positive.  Musculoskeletal: no clubbing / cyanosis. No joint deformity upper and lower extremities. Good ROM, no contractures. Normal muscle tone.  Skin: no rashes, lesions, ulcers. No induration Neurologic: CN 2-12 grossly intact. Sensation intact, DTR normal. Strength 5/5 in all 4.  Psychiatric: Normal judgment and insight. Alert and oriented x 3. Normal mood.   Data Reviewed:  EKG reveals sinus rhythm at 97 bpm with QTc 520.  Assessment and Plan:  SIRS/Sepsis secondary to suspected community acquired pneumonia Patient presented and was noted to be febrile up to 100.4 F with tachycardia, tachypnea, and leukocytosis meeting SIRS criteria.  Lactic acid was reassuring at 1.3.  Chest x-ray noted suspected small right pleural effusion with worsening bilateral lung base airspace disease. -Admit to a progressive bed -Aspiration precautions with elevation head of bed -Follow-up blood and sputum cultures -Check procalcitonin and MRSA screen -Continue empiric antibiotics of Rocephin and doxycycline -Continue Mucinex  Diarrhea Acute.  Patient reported having approximately 5 episodes of diarrhea the other day, but has since not had any more episodes.  Question the possibility of gastroenteritis as symptoms have possibly resolved, but could also be secondary to patient's tube feeds. -Monitor intake and output -Check C. difficile and GI panel -Probiotics  Chronic respiratory failure with hypoxia Patient was weaned off the ventilator from hospitalization in 11/2022 and currently on 2 L nasal cannula oxygen at baseline. -Continue 2 L nasal cannula oxygen maintain O2 saturation greater than 92%  Heart failure with reduced EF Status post V-fib arrest Heart block status post permanent pacemaker Chronic.  Patient appears euvolemic at this time.  Last echocardiogram 11/2022 noted EF to be  around 20% with severely reduced right ventricular function.  Right/left heart cath noted normal coronaries with good cardiac output. -Continue education regimen as tolerated -Continue outpatient follow-up with cardiology  Dysphagia Patient status post PEG tube.  -Aspiration precautions with elevation head of the bed -Resume scopolamine patch -Continue tube feeds -Registered dietitian consulted  Myotonic dystrophy -PT to eval -Continue outpatient follow-up with neurology  Anxiety  -Continue Seroquel  Hypothyroidism Patient status post thyroidectomy.  TSH 1.978 on 11/11/2022 -Continue levothyroxine  Severe protein calorie malnutrition Albumin 2.6. -Add on prealbumin -Registered dietitian consult for tube feeds  GERD -Continue Protonix   DVT prophylaxis:  lovenox Advance Care Planning:   Code  Status: Full Code    Consults: None   Family Communication: Family updated at bedside  Severity of Illness: The appropriate patient status for this patient is INPATIENT. Inpatient status is judged to be reasonable and necessary in order to provide the required intensity of service to ensure the patient's safety. The patient's presenting symptoms, physical exam findings, and initial radiographic and laboratory data in the context of their chronic comorbidities is felt to place them at high risk for further clinical deterioration. Furthermore, it is not anticipated that the patient will be medically stable for discharge from the hospital within 2 midnights of admission.   * I certify that at the point of admission it is my clinical judgment that the patient will require inpatient hospital care spanning beyond 2 midnights from the point of admission due to high intensity of service, high risk for further deterioration and high frequency of surveillance required.*  Author: Clydie Braun, MD 01/23/2023 8:47 AM  For on call review [email or web address].

## 2023-01-23 NOTE — ED Notes (Signed)
Per Katrinka Blazing MD, collect 2nd set of cultures

## 2023-01-23 NOTE — Telephone Encounter (Signed)
Was about to call pt until reviewed chart and pt is in hospital.  Reason for Disposition  Patient already left for the hospital/clinic.  Protocols used: No Contact or Duplicate Contact Call-A-AH

## 2023-01-23 NOTE — ED Notes (Signed)
Notified Smith MD to gain clarification regarding 2nd set of blood culture collection.  Pending response.

## 2023-01-23 NOTE — Progress Notes (Signed)
RT instructed patient on the use of a flutter valve. Patient was able to demonstrate back good technique. 

## 2023-01-23 NOTE — Plan of Care (Signed)

## 2023-01-23 NOTE — ED Notes (Signed)
ED TO INPATIENT HANDOFF REPORT  ED Nurse Name and Phone #:   S Name/Age/Gender Cynthia Underwood 52 y.o. female Room/Bed: 011C/011C  Code Status   Code Status: Full Code  Home/SNF/Other Home Patient oriented to: self, place, time, and situation Is this baseline?    Triage Complete: Triage complete  Chief Complaint Infectious diarrhea [A09] SIRS (systemic inflammatory response syndrome) (HCC) [R65.10]  Triage Note Pt in with weakness, diarrhea and hypotension, arrives with cousin who provides further details. Pt was recently hospitalized for hypoxia r/t PNA, had Vfib arrest and was kept in hospital for >20 days, released from [facility]. Pt has been having diarrhea since yesterday, reporting at least 5 episodes, and this happens after every tube feeding. Recently had PEG placed, pt is NPO. EMS was called out to house today, and they gave 1L LR - initial pressure there 80/60's. Pt weak and lethargic in triage, but oriented x 4. BP 100/67 HR 99   Allergies Allergies  Allergen Reactions   Penicillin G Other (See Comments), Rash and Nausea And Vomiting    Sore throat   Penicillins Nausea And Vomiting, Rash and Other (See Comments)    Fever Has patient had a PCN reaction causing immediate rash, facial/tongue/throat swelling, SOB or lightheadedness with hypotension: Yes Has patient had a PCN reaction causing severe rash involving mucus membranes or skin necrosis: Unknown Has patient had a PCN reaction that required hospitalization: No Has patient had a PCN reaction occurring within the last 10 years: No If all of the above answers are "NO", then may proceed with Cephalosporin use.     Level of Care/Admitting Diagnosis ED Disposition     ED Disposition  Admit   Condition  --   Comment  Hospital [facility]: [facility] [[id number]]  Level of Care: Progressive [102]  Admit to Progressive based on following criteria: MULTISYSTEM THREATS such as  stable sepsis, metabolic/electrolyte imbalance with or without encephalopathy that is responding to early treatment.  May admit patient to [facility] or [facility] if equivalent level of care is available:: No  Covid Evaluation: Asymptomatic - no recent exposure (last 10 days) testing not required  Diagnosis: SIRS (systemic inflammatory response syndrome) [facility]) [[id number]]  Admitting Physician: Clydie Braun [[id number]]  Attending Physician: Clydie Braun [[id number]]  Certification:: I certify this patient will need inpatient services for at least 2 midnights  Estimated Length of Stay: 2          B Medical/Surgery History Past Medical History:  Diagnosis Date   Bifascicular block 10/02/2014   Cataract    Dyspnea    Excess ear wax    Multinodular goiter    Pneumonia    Presence of permanent cardiac pacemaker    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   Sickle cell trait (HCC)    Watery eyes    Left   Weakness of both legs    Past Surgical History:  Procedure Laterality Date   BREAST BIOPSY Right    EYE SURGERY Left 2017   "related to blockage in my nose"   EYE SURGERY Right 05/2019   INSERT / REPLACE / REMOVE PACEMAKER  02/15/2017   IR GASTROSTOMY TUBE MOD SED  01/01/2023   PACEMAKER IMPLANT N/A 02/15/2017   Procedure: Pacemaker Implant;  Surgeon: Duke Salvia, MD;  Location: [facility] INVASIVE CV LAB;  Service: Cardiovascular;  Laterality: N/A;   RIGHT/LEFT HEART CATH AND CORONARY ANGIOGRAPHY N/A 11/16/2022  Procedure: RIGHT/LEFT HEART CATH AND CORONARY ANGIOGRAPHY;  Surgeon: Laurey Morale, MD;  Location: [facility] INVASIVE CV LAB;  Service: Cardiovascular;  Laterality: N/A;   THYROIDECTOMY N/A 04/21/2021   Procedure: TOTAL THYROIDECTOMY;  Surgeon: Axel Filler, MD;  Location: [facility] OR;  Service: General;  Laterality: N/A;     A IV Location/Drains/Wounds Patient Lines/Drains/Airways Status     Active Line/Drains/Airways     Name  Placement date Placement time Site Days   Peripheral IV 01/22/23 20 G Left;Posterior Wrist 01/22/23  2311  Wrist  1   Gastrostomy/Enterostomy Percutaneous endoscopic gastrostomy (PEG) 20 Fr. LUQ 01/01/23  0947  LUQ  22            Intake/Output Last 24 hours  Intake/Output Summary (Last 24 hours) at 01/23/2023 1247 Last data filed at 01/23/2023 1047 Gross per 24 hour  Intake 210 ml  Output --  Net 210 ml    Labs/Imaging Results for orders placed or performed during the hospital encounter of 01/22/23 (from the past 48 hour(s))  Lipase, blood     Status: None   Collection Time: 01/22/23 11:11 PM  Result Value Ref Range   Lipase 27 11 - 51 U/L    Comment: Performed at [facility], [address], [place], [place] [address]  Comprehensive metabolic panel     Status: Abnormal   Collection Time: 01/22/23 11:11 PM  Result Value Ref Range   Sodium 136 135 - 145 mmol/L   Potassium 4.0 3.5 - 5.1 mmol/L   Chloride 100 98 - 111 mmol/L   CO2 25 22 - 32 mmol/L   Glucose, Bld 120 (H) 70 - 99 mg/dL    Comment: Glucose reference range applies only to samples taken after fasting for at least 8 hours.   BUN 12 6 - 20 mg/dL   Creatinine, Ser 9.14 0.44 - 1.00 mg/dL   Calcium 8.9 8.9 - 78.2 mg/dL   Total Protein 7.5 6.5 - 8.1 g/dL   Albumin 2.6 (L) 3.5 - 5.0 g/dL   AST 24 15 - 41 U/L   ALT 39 0 - 44 U/L   Alkaline Phosphatase 71 38 - 126 U/L   Total Bilirubin 0.4 0.3 - 1.2 mg/dL   GFR, Estimated >95 >62 mL/min    Comment: (NOTE) Calculated using the CKD-EPI Creatinine Equation (2021)    Anion gap 11 5 - 15    Comment: Performed at [facility], [address] [place], [place] [address]  CBC     Status: Abnormal   Collection Time: 01/22/23 11:11 PM  Result Value Ref Range   WBC 20.0 (H) 4.0 - 10.5 K/uL   RBC 3.89 3.87 - 5.11 MIL/uL   Hemoglobin 12.1 12.0 - 15.0 g/dL   HCT 57.8 46.9 - 62.9 %   MCV 99.2 80.0 - 100.0 fL   MCH 31.1 26.0 - 34.0 pg   MCHC 31.3 30.0 - 36.0  g/dL   RDW 52.8 (H) 41.3 - 24.4 %   Platelets 255 150 - 400 K/uL   nRBC 0.0 0.0 - 0.2 %    Comment: Performed at [facility], [address] [place], [place] [address]  Lactic acid, plasma     Status: None   Collection Time: 01/22/23 11:11 PM  Result Value Ref Range   Lactic Acid, Venous 1.3 0.5 - 1.9 mmol/L    Comment: Performed at [facility], [address], [place], [place] [address]  Digoxin level  Status: None   Collection Time: 01/22/23 11:11 PM  Result Value Ref Range   Digoxin Level 0.9 0.8 - 2.0 ng/mL    Comment: Performed at [facility], [address], [place], [place] [address]  Procalcitonin     Status: None   Collection Time: 01/22/23 11:11 PM  Result Value Ref Range   Procalcitonin 0.11 ng/mL    Comment:        Interpretation: PCT (Procalcitonin) <= 0.5 ng/mL: Systemic infection (sepsis) is not likely. Local bacterial infection is possible. (NOTE)       Sepsis PCT Algorithm           Lower Respiratory Tract                                      Infection PCT Algorithm    ----------------------------     ----------------------------         PCT < 0.25 ng/mL                PCT < 0.10 ng/mL          Strongly encourage             Strongly discourage   discontinuation of antibiotics    initiation of antibiotics    ----------------------------     -----------------------------       PCT 0.25 - 0.50 ng/mL            PCT 0.10 - 0.25 ng/mL               OR       >80% decrease in PCT            Discourage initiation of                                            antibiotics      Encourage discontinuation           of antibiotics    ----------------------------     -----------------------------         PCT >= 0.50 ng/mL              PCT 0.26 - 0.50 ng/mL               AND        <80% decrease in PCT             Encourage initiation of                                             antibiotics       Encourage continuation           of  antibiotics    ----------------------------     -----------------------------        PCT >= 0.50 ng/mL                  PCT > 0.50 ng/mL               AND         increase in PCT  Strongly encourage                                      initiation of antibiotics    Strongly encourage escalation           of antibiotics                                     -----------------------------                                           PCT <= 0.25 ng/mL                                                 OR                                        > 80% decrease in PCT                                      Discontinue / Do not initiate                                             antibiotics  Performed at [facility], [address] [place], [place] [address]   Blood culture (routine x 2)     Status: None (Preliminary result)   Collection Time: 01/22/23 11:31 PM   Specimen: BLOOD  Result Value Ref Range   Specimen Description BLOOD BLOOD LEFT WRIST    Special Requests      BOTTLES DRAWN AEROBIC AND ANAEROBIC Blood Culture results may not be optimal due to an inadequate volume of blood received in culture bottles   Culture      NO GROWTH < 12 HOURS Performed at [facility], [address] [place], [place] [address]    Report Status PENDING   Urinalysis, Routine w reflex microscopic -Urine, Clean Catch     Status: Abnormal   Collection Time: 01/23/23  6:15 AM  Result Value Ref Range   Color, Urine YELLOW YELLOW   APPearance CLEAR CLEAR   Specific Gravity, Urine >1.046 (H) 1.005 - 1.030   pH 5.0 5.0 - 8.0   Glucose, UA NEGATIVE NEGATIVE mg/dL   Hgb urine dipstick NEGATIVE NEGATIVE   Bilirubin Urine NEGATIVE NEGATIVE   Ketones, ur NEGATIVE NEGATIVE mg/dL   Protein, ur 30 (A) NEGATIVE mg/dL   Nitrite NEGATIVE NEGATIVE   Leukocytes,Ua NEGATIVE NEGATIVE   RBC / HPF 0-5 0 - 5 RBC/hpf   WBC, UA 0-5 0 - 5 WBC/hpf   Bacteria, UA NONE SEEN NONE SEEN   Squamous  Epithelial / HPF 0-5 0 - 5 /HPF   Mucus PRESENT     Comment: Performed at [facility], [address], [place], [place] [address]   CT ABDOMEN PELVIS W CONTRAST  Result Date: 01/23/2023 CLINICAL DATA:  Abdominal pain, acute, nonlocalized, diarrhea, hypotension EXAM: CT ABDOMEN AND PELVIS WITH CONTRAST TECHNIQUE: Multidetector CT imaging of the abdomen and pelvis was performed using the standard protocol following bolus administration of intravenous contrast. RADIATION DOSE REDUCTION: This exam was performed according to the departmental dose-optimization program which includes automated exposure control, adjustment of the mA and/or kV according to patient size and/or use of iterative reconstruction technique. CONTRAST:  75mL OMNIPAQUE IOHEXOL 350 MG/ML SOLN COMPARISON:  12/26/2022, CT chest 11/11/2022 FINDINGS: Lower chest: There is persistent dense collapse and consolidation of the lower lobes bilaterally with marked volume loss. Again noted is bronchiectasis and multifocal airway impaction within the collapsed segments. While this appears stable since prior examination, this is new since remote prior examination of 11/11/2022. Pacemaker leads noted within the right heart. Cardiac size within normal limits. Hepatobiliary: No focal liver abnormality is seen. No gallstones, gallbladder wall thickening, or biliary dilatation. Pancreas: Unremarkable Spleen: Unremarkable Adrenals/Urinary Tract: The adrenal glands are unremarkable. The kidneys are normal in size and position. Multiple simple cortical cysts are seen within the kidneys bilaterally. No follow-up imaging is recommended for these lesions. The kidneys are otherwise unremarkable. The bladder is unremarkable. Stomach/Bowel: Pull-through type gastrostomy catheter in appropriate position within the mid to distal body of the stomach. A 2 cm linear metallic density is seen intraluminally within the bowel within the right hemipelvis. There is no  surrounding inflammatory stranding or loculated fluid collection to suggest perforation. The stomach, small bowel, and large bowel are otherwise unremarkable. Appendix normal. No free intraperitoneal gas or fluid. Vascular/Lymphatic: No significant vascular findings are present. No enlarged abdominal or pelvic lymph nodes. Reproductive: Uterus and bilateral adnexa are unremarkable. Other: No abdominal wall hernia Musculoskeletal: Bilateral L5 pars defects are present without associated spondylolisthesis. No acute bone abnormality. No lytic or blastic bone lesion. IMPRESSION: 1. No acute intra-abdominal pathology identified. No definite radiographic explanation for the patient's reported symptoms. 2. Pull-through type gastrostomy catheter in appropriate position. 3. 2 cm linear metallic density intraluminally within the bowel within the right hemipelvis. This may represent an ingested foreign body. No surrounding inflammatory stranding or loculated fluid collection to suggest perforation. 4. Persistent dense collapse and consolidation of the lower lobes bilaterally with marked volume loss. Again noted is bronchiectasis and multifocal airway impaction within the collapsed segments. While this appears stable since prior examination, this is new since remote prior examination of 11/11/2022. Electronically Signed   By: Helyn Numbers M.D.   On: 01/23/2023 03:06   DG Chest 2 View  Result Date: 01/22/2023 CLINICAL DATA:  Weakness cough EXAM: CHEST - 2 VIEW COMPARISON:  01/05/2023, CT 11/11/2022, radiograph 01/12/2023 FINDINGS: Suspicion of right pleural effusion. Worsening bilateral lung base airspace disease compared to prior. Stable cardiomediastinal silhouette. No pneumothorax.Left-sided pacing device as before. IMPRESSION: Suspect small right pleural effusion. Worsening bilateral lung base airspace disease compared to prior Electronically Signed   By: Jasmine Pang M.D.   On: 01/22/2023 23:31    Pending  Labs Unresulted Labs (From admission, onward)     Start     Ordered   01/24/23 0500  CBC  Tomorrow morning,   R        01/23/23 0903   01/24/23 0500  Basic metabolic panel  Tomorrow morning,   R        01/23/23 0903   01/24/23 0500  Phosphorus  Tomorrow morning,  R        01/23/23 0907   01/23/23 0859  MRSA Next Gen by PCR, Nasal  Once,   R        01/23/23 0903   01/23/23 0858  Expectorated Sputum Assessment w Gram Stain, Rflx to Resp Cult  Once,   R        01/23/23 0903   01/22/23 2331  Blood culture (routine x 2)  BLOOD CULTURE X 2,   R      01/22/23 2331   01/22/23 2331  Gastrointestinal Panel by PCR , Stool  (Gastrointestinal Panel by PCR, Stool                                                                                                                                                     **Does Not include CLOSTRIDIUM DIFFICILE testing. **If CDIFF testing is needed, place order from the "C Difficile Testing" order set.**)  Once,   URGENT        01/22/23 2331   01/22/23 2331  C Difficile Quick Screen w PCR reflex  (C Difficile quick screen w PCR reflex panel )  Once, for 24 hours,   URGENT       References:    CDiff Information Tool   01/22/23 2331   Signed and Held  Prealbumin  Add-on,   R        Signed and Held            Vitals/Pain Today's Vitals   01/23/23 0930 01/23/23 0945 01/23/23 1000 01/23/23 1015  BP: 102/67 110/75 105/76 103/71  Pulse: 78 75 79 78  Resp: 15 18 15 19   Temp:      TempSrc:      SpO2: 100% 100% 100% 100%  Weight:      PainSc:        Isolation Precautions Enteric precautions (UV disinfection)  Medications Medications  enoxaparin (LOVENOX) injection 40 mg (40 mg Subcutaneous Given 01/23/23 1047)  sodium chloride flush (NS) 0.9 % injection 3 mL (3 mLs Intravenous Given 01/23/23 1047)  acetaminophen (TYLENOL) tablet 650 mg (has no administration in time range)    Or  acetaminophen (TYLENOL) suppository 650 mg (has no administration in time  range)  albuterol (PROVENTIL) (2.5 MG/3ML) 0.083% nebulizer solution 2.5 mg (has no administration in time range)  cefTRIAXone (ROCEPHIN) 2 g in sodium chloride 0.9 % 100 mL IVPB (has no administration in time range)  cefTRIAXone (ROCEPHIN) 2 g in sodium chloride 0.9 % 100 mL IVPB (0 g Intravenous Stopped 01/23/23 0339)  metroNIDAZOLE (FLAGYL) IVPB 500 mg (0 mg Intravenous Stopped 01/23/23 0521)  iohexol (OMNIPAQUE) 350 MG/ML injection 75 mL (75 mLs Intravenous Contrast Given 01/23/23 0233)  acetaminophen (TYLENOL) suppository 650 mg (650 mg Rectal Given 01/23/23 0251)    Mobility   Have not been  able to assess   Focused Assessments Cardiac Assessment Handoff:  Cardiac Rhythm: Normal sinus rhythm No results found for: "CKTOTAL", "CKMB", "CKMBINDEX", "TROPONINI" Lab Results  Component Value Date   DDIMER 1.11 (H) 12/13/2022   Does the Patient currently have chest pain? No    R Recommendations: See Admitting Provider Note  Report given to:   Additional Notes:

## 2023-01-23 NOTE — ED Notes (Addendum)
Lab to add on procal to previous collection. Notified phlebotomist of need for 2nd set of cultures to be drawn

## 2023-01-24 ENCOUNTER — Inpatient Hospital Stay (HOSPITAL_COMMUNITY): Payer: 59

## 2023-01-24 DIAGNOSIS — R6510 Systemic inflammatory response syndrome (SIRS) of non-infectious origin without acute organ dysfunction: Secondary | ICD-10-CM | POA: Diagnosis not present

## 2023-01-24 LAB — BASIC METABOLIC PANEL
Anion gap: 7 (ref 5–15)
BUN: 10 mg/dL (ref 6–20)
CO2: 28 mmol/L (ref 22–32)
Calcium: 8.6 mg/dL — ABNORMAL LOW (ref 8.9–10.3)
Chloride: 103 mmol/L (ref 98–111)
Creatinine, Ser: 0.48 mg/dL (ref 0.44–1.00)
GFR, Estimated: >60 mL/min (ref >60)
Glucose, Bld: 94 mg/dL (ref 70–99)
Potassium: 3.4 mmol/L — ABNORMAL LOW (ref 3.5–5.1)
Sodium: 138 mmol/L (ref 135–145)

## 2023-01-24 LAB — GLUCOSE, CAPILLARY
Glucose-Capillary: 101 mg/dL — ABNORMAL HIGH (ref 70–99)
Glucose-Capillary: 101 mg/dL — ABNORMAL HIGH (ref 70–99)
Glucose-Capillary: 104 mg/dL — ABNORMAL HIGH (ref 70–99)
Glucose-Capillary: 112 mg/dL — ABNORMAL HIGH (ref 70–99)
Glucose-Capillary: 88 mg/dL (ref 70–99)

## 2023-01-24 LAB — CBC
HCT: 30.7 % — ABNORMAL LOW (ref 36.0–46.0)
Hemoglobin: 9.7 g/dL — ABNORMAL LOW (ref 12.0–15.0)
MCH: 30.8 pg (ref 26.0–34.0)
MCHC: 31.6 g/dL (ref 30.0–36.0)
MCV: 97.5 fL (ref 80.0–100.0)
Platelets: 206 10*3/uL (ref 150–400)
RBC: 3.15 MIL/uL — ABNORMAL LOW (ref 3.87–5.11)
RDW: 16.1 % — ABNORMAL HIGH (ref 11.5–15.5)
WBC: 7.9 10*3/uL (ref 4.0–10.5)
nRBC: 0 % (ref 0.0–0.2)

## 2023-01-24 LAB — MAGNESIUM: Magnesium: 2.3 mg/dL (ref 1.7–2.4)

## 2023-01-24 LAB — C-REACTIVE PROTEIN: CRP: 11.1 mg/dL — ABNORMAL HIGH (ref <1.0)

## 2023-01-24 LAB — PROCALCITONIN: Procalcitonin: <0.1 ng/mL

## 2023-01-24 LAB — C DIFFICILE QUICK SCREEN W PCR REFLEX
C Diff antigen: NEGATIVE
C Diff interpretation: NOT DETECTED
C Diff toxin: NEGATIVE

## 2023-01-24 LAB — PHOSPHORUS: Phosphorus: 2.8 mg/dL (ref 2.5–4.6)

## 2023-01-24 LAB — BRAIN NATRIURETIC PEPTIDE: B Natriuretic Peptide: 54.9 pg/mL (ref 0.0–100.0)

## 2023-01-24 LAB — CULTURE, BLOOD (ROUTINE X 2)

## 2023-01-24 MED ORDER — POTASSIUM CHLORIDE CRYS ER 20 MEQ PO TBCR
40.0000 meq | EXTENDED_RELEASE_TABLET | Freq: Once | ORAL | Status: AC
  Administered 2023-01-24: 40 meq via ORAL
  Filled 2023-01-24: qty 2

## 2023-01-24 MED ORDER — FREE WATER
170.0000 mL | Freq: Four times a day (QID) | Status: DC
  Administered 2023-01-24 – 2023-01-29 (×19): 170 mL

## 2023-01-24 MED ORDER — PROSOURCE TF20 ENFIT COMPATIBL EN LIQD
60.0000 mL | Freq: Every day | ENTERAL | Status: DC
  Administered 2023-01-24 – 2023-01-28 (×5): 60 mL
  Filled 2023-01-24 (×6): qty 60

## 2023-01-24 MED ORDER — DOXYCYCLINE HYCLATE 100 MG PO TABS
100.0000 mg | ORAL_TABLET | Freq: Two times a day (BID) | ORAL | Status: DC
  Administered 2023-01-24 – 2023-01-29 (×12): 100 mg via ORAL
  Filled 2023-01-24 (×13): qty 1

## 2023-01-24 MED ORDER — JEVITY 1.5 CAL/FIBER PO LIQD
1000.0000 mL | ORAL | Status: DC
  Administered 2023-01-24 – 2023-01-25 (×2): 1000 mL
  Filled 2023-01-24 (×3): qty 1000

## 2023-01-24 NOTE — Progress Notes (Signed)
PROGRESS NOTE                                                                                                                                                                                                             Patient Demographics:    Cynthia Underwood, is a 52 y.o. female, DOB - August 22, 1971, VHQ:[id number]  Outpatient Primary MD for the patient is Shayne Alken, MD    LOS - 1  Admit date - 01/22/2023    Chief Complaint  Patient presents with   Weakness   Diarrhea       Brief Narrative (HPI from H&P)    52 y.o. female with medical history significant of myotonic dystrophy, hypothyroidism, anxiety, heart block with pacer, HFrEF, and admission in 11/2022 for pneumonia with hypoxic respiratory failure failure that was complicated by VF arrest, was recently [facility], she was discharged from the [facility] on 01/19/2023.  She now presents with ongoing cough, diarrhea and low-grade fevers.  Diagnosed with aspiration pneumonia and admitted to the hospital, patient claims that prior to her discharge she had few episodes of nausea and vomiting at the [facility] facility.   Subjective:    Quentin Ore today has, No headache, No chest pain, No abdominal pain - No Nausea, No new weakness tingling or numbness, ++ cough, no SOB   Assessment  & Plan :   Aspiration pneumonia.  No sepsis.  Had some emesis prior to her discharge from the LTAC facility and likely aspirated due to that, much improved on empiric antibiotic, already n.p.o. with tube feeds via PEG tube which will be continued.  Speech to evaluate, encouraged to sit in chair use I-S and flutter valve.  Follow cultures.  Chronic hypoxic respiratory failure.  On baseline 2 L nasal cannula.    Diarrhea.  Currently resolved.  Monitor.  No bowel movements since she is on the floor for the last 14  hours.  History of chronic systolic heart failure EF 20% due to nonischemic cardiomyopathy. History of V-fib arrest - March 2024.  Follows with Dr. Marca Ancona.  Currently compensated.  She is on digoxin at baseline which will be continued.  V-fib arrest was thought to be due to reversible causes by the cardiology team hence defibrillator was not pursued.  High-grade heart  block.  Has pacemaker.    History of myotonic dystrophy.  Generalized weakness, dysphagia, previous history of trach now decannulated with healed stoma.    Dysphagia -  with PEG tube feed.  Anxiety.  On Seroquel.  Hypothyroidism.  Continue Synthroid.  Stable TSH.  GERD.  On PPI.  Possible artifact versus foreign body in the GI tract noted on CT scan in the ER.  Will check with GI.  Symptom-free.        Condition - Extremely Guarded  Family Communication  :  message left for [name] [phone], 01/24/2023 at 9:20 AM  Code Status :  Full  Consults  :  None  PUD Prophylaxis : PPI   Procedures  :     CT - 1. No acute intra-abdominal pathology identified. No definite radiographic explanation for the patient's reported symptoms. 2. Pull-through type gastrostomy catheter in appropriate position. 3. 2 cm linear metallic density intraluminally within the bowel within the right hemipelvis. This may represent an ingested foreign body. No surrounding inflammatory stranding or loculated fluid collection to suggest perforation. 4. Persistent dense collapse and consolidation of the lower lobes bilaterally with marked volume loss. Again noted is bronchiectasis and multifocal airway impaction within the collapsed segments. While this appears stable since prior examination, this is new since remote prior examination of 11/11/2022.      Disposition Plan  :    Status is: Inpatient  DVT Prophylaxis  :    enoxaparin (LOVENOX) injection 40 mg Start: 01/23/23 0915    Lab Results  Component Value Date   PLT 206 01/24/2023     Diet :  Diet Order             Diet NPO time specified Except for: Ice Chips  Diet effective now                    Inpatient Medications  Scheduled Meds:  digoxin  0.25 mg Per Tube Daily   enoxaparin (LOVENOX) injection  40 mg Subcutaneous Q24H   guaiFENesin  400 mg Oral TID   levothyroxine  112 mcg Per Tube Q0600   metoCLOPramide  5 mg Per Tube BID WC   metoprolol tartrate  12.5 mg Per Tube BID   pantoprazole  40 mg Oral Daily   QUEtiapine  25 mg Per Tube QHS   saccharomyces boulardii  250 mg Per Tube BID   sodium chloride flush  3 mL Intravenous Q12H   spironolactone  12.5 mg Per Tube Daily   Continuous Infusions:  cefTRIAXone (ROCEPHIN)  IV 2 g (01/24/23 0342)   doxycycline (VIBRAMYCIN) IV 100 mg (01/24/23 0721)   feeding supplement (OSMOLITE 1.2 CAL) 1,000 mL (01/23/23 1812)   PRN Meds:.acetaminophen **OR** acetaminophen, albuterol  Antibiotics  :    Anti-infectives (From admission, onward)    Start     Dose/Rate Route Frequency Ordered Stop   01/24/23 0200  cefTRIAXone (ROCEPHIN) 2 g in sodium chloride 0.9 % 100 mL IVPB        2 g 200 mL/hr over 30 Minutes Intravenous Every 24 hours 01/23/23 0924     01/23/23 1800  doxycycline (VIBRAMYCIN) 100 mg in sodium chloride 0.9 % 250 mL IVPB        100 mg 125 mL/hr over 120 Minutes Intravenous Every 12 hours 01/23/23 1706     01/23/23 0215  cefTRIAXone (ROCEPHIN) 2 g in sodium chloride 0.9 % 100 mL IVPB        2 g 200  mL/hr over 30 Minutes Intravenous  Once 01/23/23 0213 01/23/23 0339   01/23/23 0215  metroNIDAZOLE (FLAGYL) IVPB 500 mg        500 mg 100 mL/hr over 60 Minutes Intravenous  Once 01/23/23 0213 01/23/23 0521         Objective:   Vitals:   01/23/23 1240 01/23/23 2000 01/24/23 0025 01/24/23 0500  BP: 99/70 107/67 107/63 100/62  Pulse: 84 76 70 70  Resp: 20 16 20 15   Temp:  98.3 F (36.8 C) 98.3 F (36.8 C) 98.3 F (36.8 C)  TempSrc:  Oral Oral Oral  SpO2: 95% 99% 98% 96%  Weight:     44.8 kg    Wt Readings from Last 3 Encounters:  01/24/23 44.8 kg  12/06/22 39.9 kg  11/08/22 44.7 kg     Intake/Output Summary (Last 24 hours) at 01/24/2023 8295 Last data filed at 01/24/2023 6213 Gross per 24 hour  Intake 10 ml  Output 200 ml  Net -190 ml     Physical Exam  Weak and frail middle-aged African-American female, awake Alert, No new F.N deficits, trach site stoma has healed, PEG tube in place [place].AT,PERRAL Supple Neck, No JVD,   Symmetrical Chest wall movement, Good air movement bilaterally, CTAB RRR,No Gallops,Rubs or new Murmurs,  +ve B.Sounds, Abd Soft, No tenderness,   No Cyanosis, Clubbing or edema        Data Review:    Recent Labs  Lab 01/18/23 0942 01/22/23 2311 01/24/23 0415  WBC 6.8 20.0* 7.9  HGB 11.8* 12.1 9.7*  HCT 37.8 38.6 30.7*  PLT 286 255 206  MCV 97.2 99.2 97.5  MCH 30.3 31.1 30.8  MCHC 31.2 31.3 31.6  RDW 16.0* 16.0* 16.1*    Recent Labs  Lab 01/18/23 0942 01/22/23 2311 01/24/23 0415 01/24/23 0645  NA 140 136 138  --   K 4.3 4.0 3.4*  --   CL 101 100 103  --   CO2 29 25 28   --   ANIONGAP 10 11 7   --   GLUCOSE 92 120* 94  --   BUN 15 12 10   --   CREATININE 0.50 0.55 0.48  --   AST  --  24  --   --   ALT  --  39  --   --   ALKPHOS  --  71  --   --   BILITOT  --  0.4  --   --   ALBUMIN  --  2.6*  --   --   CRP  --   --   --  11.1*  PROCALCITON  --  0.11  --  <0.10  LATICACIDVEN  --  1.3  --   --   BNP  --   --   --  54.9  MG 2.1  --   --  2.3  CALCIUM 9.2 8.9 8.6*  --       Recent Labs  Lab 01/18/23 0942 01/22/23 2311 01/24/23 0415 01/24/23 0645  CRP  --   --   --  11.1*  PROCALCITON  --  0.11  --  <0.10  LATICACIDVEN  --  1.3  --   --   BNP  --   --   --  54.9  MG 2.1  --   --  2.3  CALCIUM 9.2 8.9 8.6*  --     Recent Labs  Lab 01/18/23 0942 01/22/23 2311 01/24/23 0415 01/24/23 0645  WBC 6.8 20.0*  7.9  --   PLT 286 255 206  --   CRP  --   --   --  11.1*  PROCALCITON  --  0.11  --  <0.10   LATICACIDVEN  --  1.3  --   --   CREATININE 0.50 0.55 0.48  --     ------------------------------------------------------------------------------------------------------------------ Lab Results  Component Value Date   CHOL 214 (H) 01/10/2018   HDL 57 01/10/2018   LDLCALC 134 (H) 01/10/2018   TRIG 113 01/10/2018   CHOLHDL 3.8 01/10/2018    Lab Results  Component Value Date   HGBA1C 5.6 11/15/2022   Micro Results Recent Results (from the past 240 hour(s))  Blood culture (routine x 2)     Status: None (Preliminary result)   Collection Time: 01/22/23 11:31 PM   Specimen: BLOOD  Result Value Ref Range Status   Specimen Description BLOOD BLOOD LEFT WRIST  Final   Special Requests   Final    BOTTLES DRAWN AEROBIC AND ANAEROBIC Blood Culture results may not be optimal due to an inadequate volume of blood received in culture bottles   Culture   Final    NO GROWTH 2 DAYS Performed at [facility], [address] [place], [place] [address]    Report Status PENDING  Incomplete  Expectorated Sputum Assessment w Gram Stain, Rflx to Resp Cult     Status: None   Collection Time: 01/23/23  2:20 PM   Specimen: Expectorated Sputum  Result Value Ref Range Status   Specimen Description EXPECTORATED SPUTUM  Final   Special Requests NONE  Final   Sputum evaluation   Final    Sputum specimen not acceptable for testing.  Please recollect.   NOTIFIED RN [facility] TAYLOR  ON 01/23/23 @ 2033 BY DRT Performed at [facility], [address] [place], [place] [address]    Report Status 01/23/2023 FINAL  Final  Blood culture (routine x 2)     Status: None (Preliminary result)   Collection Time: 01/23/23  2:52 PM   Specimen: BLOOD RIGHT HAND  Result Value Ref Range Status   Specimen Description BLOOD RIGHT HAND  Final   Special Requests   Final    BOTTLES DRAWN AEROBIC ONLY Blood Culture results may not be optimal due to an inadequate volume of blood received in culture bottles   Culture    Final    NO GROWTH < 24 HOURS Performed at [facility], [address], [place], [place] [address]    Report Status PENDING  Incomplete    Radiology Reports DG Chest Port 1 View  Result Date: 01/24/2023 CLINICAL DATA:  Shortness of breath. EXAM: PORTABLE CHEST 1 VIEW COMPARISON:  01/22/2023 FINDINGS: Persistent right pleural effusion and bibasilar infiltrates. Stable pacer wires.  Stable surgical changes in the neck. IMPRESSION: Persistent right pleural effusion and bibasilar infiltrates. Electronically Signed   By: Rudie Meyer M.D.   On: 01/24/2023 07:08   CT ABDOMEN PELVIS W CONTRAST  Result Date: 01/23/2023 CLINICAL DATA:  Abdominal pain, acute, nonlocalized, diarrhea, hypotension EXAM: CT ABDOMEN AND PELVIS WITH CONTRAST TECHNIQUE: Multidetector CT imaging of the abdomen and pelvis was performed using the standard protocol following bolus administration of intravenous contrast. RADIATION DOSE REDUCTION: This exam was performed according to the departmental dose-optimization program which includes automated exposure control, adjustment of the mA and/or kV according to patient size and/or use of iterative reconstruction technique. CONTRAST:  75mL OMNIPAQUE IOHEXOL 350 MG/ML SOLN COMPARISON:  12/26/2022, CT chest 11/11/2022 FINDINGS: Lower chest: There is persistent dense collapse and consolidation of the lower lobes bilaterally with marked volume loss. Again noted is bronchiectasis and multifocal airway impaction within the collapsed segments. While this appears stable since prior examination, this is new since remote prior examination of 11/11/2022. Pacemaker leads noted within the right heart. Cardiac size within normal limits. Hepatobiliary: No focal liver abnormality is seen. No gallstones, gallbladder wall thickening, or biliary dilatation. Pancreas: Unremarkable Spleen: Unremarkable Adrenals/Urinary Tract: The adrenal glands are unremarkable. The kidneys are normal in size and  position. Multiple simple cortical cysts are seen within the kidneys bilaterally. No follow-up imaging is recommended for these lesions. The kidneys are otherwise unremarkable. The bladder is unremarkable. Stomach/Bowel: Pull-through type gastrostomy catheter in appropriate position within the mid to distal body of the stomach. A 2 cm linear metallic density is seen intraluminally within the bowel within the right hemipelvis. There is no surrounding inflammatory stranding or loculated fluid collection to suggest perforation. The stomach, small bowel, and large bowel are otherwise unremarkable. Appendix normal. No free intraperitoneal gas or fluid. Vascular/Lymphatic: No significant vascular findings are present. No enlarged abdominal or pelvic lymph nodes. Reproductive: Uterus and bilateral adnexa are unremarkable. Other: No abdominal wall hernia Musculoskeletal: Bilateral L5 pars defects are present without associated spondylolisthesis. No acute bone abnormality. No lytic or blastic bone lesion. IMPRESSION: 1. No acute intra-abdominal pathology identified. No definite radiographic explanation for the patient's reported symptoms. 2. Pull-through type gastrostomy catheter in appropriate position. 3. 2 cm linear metallic density intraluminally within the bowel within the right hemipelvis. This may represent an ingested foreign body. No surrounding inflammatory stranding or loculated fluid collection to suggest perforation. 4. Persistent dense collapse and consolidation of the lower lobes bilaterally with marked volume loss. Again noted is bronchiectasis and multifocal airway impaction within the collapsed segments. While this appears stable since prior examination, this is new since remote prior examination of 11/11/2022. Electronically Signed   By: Helyn Numbers M.D.   On: 01/23/2023 03:06   DG Chest 2 View  Result Date: 01/22/2023 CLINICAL DATA:  Weakness cough EXAM: CHEST - 2 VIEW COMPARISON:  01/05/2023, CT  11/11/2022, radiograph 01/12/2023 FINDINGS: Suspicion of right pleural effusion. Worsening bilateral lung base airspace disease compared to prior. Stable cardiomediastinal silhouette. No pneumothorax.Left-sided pacing device as before. IMPRESSION: Suspect small right pleural effusion. Worsening bilateral lung base airspace disease compared to prior Electronically Signed   By: Jasmine Pang M.D.   On: 01/22/2023 23:31      Signature  -   Susa Raring M.D on 01/24/2023 at 9:26 AM   -  To page go to [email or web address]

## 2023-01-24 NOTE — Evaluation (Addendum)
Clinical/Bedside Swallow Evaluation Patient Details  Name: Cynthia Underwood MRN: [id number] Date of Birth: [birth year]  Today's Date: 01/24/2023 Time: SLP Start Time (ACUTE ONLY): 1110 SLP Stop Time (ACUTE ONLY): 1120 SLP Time Calculation (min) (ACUTE ONLY): 10 min  Past Medical History:  Past Medical History:  Diagnosis Date   Bifascicular block 10/02/2014   Cataract    Dyspnea    Excess ear wax    Multinodular goiter    Pneumonia    Presence of permanent cardiac pacemaker    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   Sickle cell trait (HCC)    Watery eyes    Left   Weakness of both legs    Past Surgical History:  Past Surgical History:  Procedure Laterality Date   BREAST BIOPSY Right    EYE SURGERY Left 2017   "related to blockage in my nose"   EYE SURGERY Right 05/2019   INSERT / REPLACE / REMOVE PACEMAKER  02/15/2017   IR GASTROSTOMY TUBE MOD SED  01/01/2023   PACEMAKER IMPLANT N/A 02/15/2017   Procedure: Pacemaker Implant;  Surgeon: Duke Salvia, MD;  Location: [facility] INVASIVE CV LAB;  Service: Cardiovascular;  Laterality: N/A;   RIGHT/LEFT HEART CATH AND CORONARY ANGIOGRAPHY N/A 11/16/2022   Procedure: RIGHT/LEFT HEART CATH AND CORONARY ANGIOGRAPHY;  Surgeon: Laurey Morale, MD;  Location: [facility] INVASIVE CV LAB;  Service: Cardiovascular;  Laterality: N/A;   THYROIDECTOMY N/A 04/21/2021   Procedure: TOTAL THYROIDECTOMY;  Surgeon: Axel Filler, MD;  Location: [facility] OR;  Service: General;  Laterality: N/A;   HPI:  Patient is a 52 y.o. female with PMH: myotonic dystrophy, hypothyroidism, anxiety, heart block with pacer, HFrEF. She was recently admitted in March 2024 for PNA and hypoxic respiratory failure complicated by VF arrest, requiring intubation then trach placement. She was transferred from [facility] to [facility] where she had PEG placed due to severe dysphagia, was decannulated and ultimately dischaged home on 01/19/23. She presented  to the hospital for current admission, on 01/23/23 due to ongoing cough, diarrhea and low grade fevers. In ED, she was febrile at 100.4, tachycardic and tachypnaic meeting SIRS criteria. CXR showed small right side pleural effusion and worsening bilateral lung airspace disease compared to prior. CT abd/pelvis did not show any acute intra-abdominal pathology.    Assessment / Plan / Recommendation  Clinical Impression  Patient presenting with clinical s/s of what appears to be a pharyngeal phase dysphagia as per this bedside swallow evaluation. SLP reviewed radiology images from MBS completed 12/26/2022 while patient at [facility]); trach was visualized in place as well as NG tube. Patient with significant retention of barium in pharynx above PES with limited anterior hyoid excursion or laryngeal elevation observed. Patient has been NPO with PEG but is allowed ice chips. During today's bedside swallow evaluation, patient has since been decannulated and has no NG tube as she has a PEG. SLP assessed her swallow with thin liquids (water), observing suspected decreased laryngeal elevation but no overt s/s aspiration. SLP recommending continue NPO and proceed with MBS to determine if patient can safely consume PO's. SLP Visit Diagnosis: Dysphagia, unspecified (R13.10)    Aspiration Risk  Moderate aspiration risk;Severe aspiration risk    Diet Recommendation NPO   Medication Administration: Via alternative means    Other  Recommendations Oral Care Recommendations: Oral care QID;Oral care prior to ice chip/H20    Recommendations for follow up therapy are one component of a multi-disciplinary discharge planning  process, led by the attending physician.  Recommendations may be updated based on patient status, additional functional criteria and insurance authorization.  Follow up Recommendations Other (comment) (TBD pending MBS)      Assistance Recommended at Discharge    Functional Status Assessment  Patient has had a recent decline in their functional status and demonstrates the ability to make significant improvements in function in a reasonable and predictable amount of time.  Frequency and Duration min 2x/week  1 week       Prognosis Prognosis for improved oropharyngeal function: Fair Barriers to Reach Goals: Time post onset;Severity of deficits      Swallow Study   General Date of Onset: 01/23/23 HPI: Patient is a 52 y.o. female with PMH: myotonic dystrophy, hypothyroidism, anxiety, heart block with pacer, HFrEF. She was recently admitted in March 2024 for PNA and hypoxic respiratory failure complicated by VF arrest, requiring intubation then trach placement. She was transferred from [facility] to [facility] where she had PEG placed due to severe dysphagia, was decannulated and ultimately dischaged home on 01/19/23. She presented to the hospital for current admission, on 01/23/23 due to ongoing cough, diarrhea and low grade fevers. In ED, she was febrile at 100.4, tachycardic and tachypnaic meeting SIRS criteria. CXR showed small right side pleural effusion and worsening bilateral lung airspace disease compared to prior. CT abd/pelvis did not show any acute intra-abdominal pathology. Type of Study: Bedside Swallow Evaluation Previous Swallow Assessment: MBS while in [facility] Diet Prior to this Study: NPO Temperature Spikes Noted: No Respiratory Status: Nasal cannula History of Recent Intubation: No Behavior/Cognition: Alert;Cooperative;Pleasant mood Oral Cavity Assessment: Within Functional Limits Oral Care Completed by SLP: No Oral Cavity - Dentition: Adequate natural dentition Vision: Functional for self-feeding Self-Feeding Abilities: Able to feed self Patient Positioning: Upright in bed Baseline Vocal Quality: Hoarse;Low vocal intensity Volitional Cough: Strong Volitional Swallow: Able to elicit    Oral/Motor/Sensory Function Overall Oral Motor/Sensory Function: Within  functional limits   Ice Chips     Thin Liquid Thin Liquid: Impaired Presentation: Cup;Self Fed Pharyngeal  Phase Impairments: Decreased hyoid-laryngeal movement    Nectar Thick     Honey Thick     Puree Puree: Not tested   Solid     Solid: Not tested     Angela Nevin, MA, CCC-SLP Speech Therapy

## 2023-01-24 NOTE — Progress Notes (Signed)
Initial Nutrition Assessment  DOCUMENTATION CODES:   Severe malnutrition in context of chronic illness, Underweight  INTERVENTION:  Tube feeds via PEG: Switch to Jevity 1.5 at 45 mL/hr (1080 mL per day) 60 mL ProSource TF20 - daily Free water flush: 170 mL q6h Tube feeds at goal provides 1700 kcal, 89 gm protein, and 1501 mL total free water daily.  NUTRITION DIAGNOSIS:   Severe Malnutrition related to chronic illness (myotonic dystrophy) as evidenced by severe muscle depletion, severe fat depletion.  GOAL:   Patient will meet greater than or equal to 90% of their needs  MONITOR:   Labs, I & O's, TF tolerance, Skin  REASON FOR ASSESSMENT:   Consult Enteral/tube feeding initiation and management  ASSESSMENT:   52 y.o. female presented to the ED with diarrhea and fevers x 2 days. PMH includes CHF, myotonic dystrophy, dysphagia s/p PEG tube, and cardiac arrest. Pt admitted with SIRS, likely from pneumonia.   Met with pt in bed. Pt unsure he TF regimen at home. Denies any nausea, vomiting, or diarrhea since being at the hospital. Pt asked about when she would be able to eat, RD explained that is determine by SLP. Pt agreeable to try a formula with fiber to see if that would help with any additional diarrhea. Discussed with RN outside of room. RN shared that family said she was on bolus feeds, but was not able to tolerate full regimen. No family present during RD visit.   Discussed adjusting TF regimen with MD.   Medications reviewed and include: Doxycyline, Reglan, Protonix, Florastor, Spironolactone, IV antibiotics  Labs reviewed: Sodium 138, Potassium 3.4, Phosphorus 2.8, Magnesium 2.3, CRP 11.1 CBGs: 77-144 x 24 hours   NUTRITION - FOCUSED PHYSICAL EXAM:  Flowsheet Row Most Recent Value  Orbital Region Severe depletion  Upper Arm Region Severe depletion  Thoracic and Lumbar Region Severe depletion  Buccal Region Severe depletion  Temple Region Moderate depletion   Clavicle Bone Region Severe depletion  Clavicle and Acromion Bone Region Severe depletion  Scapular Bone Region Severe depletion  Dorsal Hand Severe depletion  Patellar Region Severe depletion  Anterior Thigh Region Severe depletion  Posterior Calf Region Severe depletion  Edema (RD Assessment) None  Hair Reviewed  Eyes Reviewed  Mouth Reviewed  Skin Reviewed  Nails Reviewed   Diet Order:   Diet Order             Diet NPO time specified Except for: Ice Chips  Diet effective now                   EDUCATION NEEDS:   Education needs have been addressed  Skin:  Skin Assessment: Reviewed RN Assessment  Last BM:  5/20  Height:  Ht Readings from Last 1 Encounters:  11/11/22 5\' 3"  (1.6 m)   Weight:  Wt Readings from Last 1 Encounters:  01/24/23 44.8 kg   Ideal Body Weight:  52.3 kg  BMI:  Body mass index is 17.5 kg/m.  Estimated Nutritional Needs:  Kcal:  1500-1700 Protein:  75-95 grams Fluid:  >/= 1.5 L   Kirby Crigler RD, LDN Clinical Dietitian See [facility] for contact information.

## 2023-01-24 NOTE — Plan of Care (Signed)

## 2023-01-24 NOTE — TOC Initial Note (Addendum)
Transition of Care [facility]) - Initial/Assessment Note    Patient Details  Name: Cynthia Underwood MRN: [id number] Date of Birth: 1971-08-14  Transition of Care [facility]) CM/SW Contact:    Mearl Latin, LCSW Phone Number: 01/24/2023, 5:32 PM  Clinical Narrative:                 CSW received consult for possible SNF placement at time of discharge. CSW spoke with patient and her mother at bedside with verbal permission. CSW went over SNF recommendation. Patient initially adamant about returning home and mother stated that they should find out what is going on medically before deciding. Patient stated she would not return to [facility]. CSW made her aware that SNF is different than LTACH level of care. Patient stated she would discuss with her sister. [name], who is her POA as well. CSW discussed insurance authorization process and will provide Medicare SNF ratings list. CSW will send out referrals for review and provide bed offers as available in the event patient is in agreement with SNF. Patient has home O2 and a walker at home.   Skilled Nursing Rehab Facilities-   [email or web address]   Ratings out of 5 stars (5 the highest)   Name Address  Phone # Quality Care Staffing Health Inspection Overall  [facility] [address] [phone] 2 1 5 4   [facility] [address] [place] [phone] 4 1 3 2   [facility] [address], [facility] [phone] 2 1 1 1   [facility] [address], [place] [phone] 4 1 3 2   [facility]  [address], [place] [phone] 3 2 5 5   [facility] [place] [address] [facility] [phone] 2 1 2 1   [facility] [address] [place] [phone] 4 1 2 1   [facility] [phone] N. [address], [place] [phone] 2 4 3 3   [address] ([place]) [address], [place] [phone] 3 2 2 2   [facility] [address] [place], [place]  [phone] 1 2 1 1   [facility] ([place]) [address], [place] [phone] 3 1 1 1   [facility]igha Bridegroom [address] Liliane Shi [phone] 4 3 4 4   [facility] [address] [place] [phone] 3 4 3 3           [facility] [address], [place] [phone]      [facility], [place] [address], [place] [phone] 1 1 2 1   [facility] [address] [place] [phone] 2 2 4 4   [facility] [place] [address] [phone] 2 1 4 3   [facility] [address], [place] [phone] 3 3 3 3           [address] (no [facility]) [address], [place] [phone] 4 4 5 5   [facility] (No Hum[employer]) [address] Lavera Guise [phone] 2 2 4 4   [facility] [address], [place] [phone] 2 1 2 1   [facility] (No [employer]) [address], [place] [phone] 5 5 5 5   [facility] [address] [facility] [phone] 2 3 5 5   [facility] [address] [place] [phone] 2 1 1 1   [facility] [address] Liliane Shi [phone] 5 2 5 5   [facility]  [address] [place] [phone] 2 2 2 2   [facility] [address] Sandre Kitty [phone] 4 2 1 1   [facility]  [address] [place], [place] [phone] 2 2 3 3           [facility] [address] [place] [phone] 1 1 1 1   [facility] [address], Evlyn Clines  [phone] 2 3 3 3   [facility] (No Humana) [address] [place] [phone] 2 1 3 2   [facility]) [address], Rosalita Levan [phone] 1 1 1 1   [facility] [address], [place] Levan [phone] 3 2 5 5   [facility] [place] [address] [place], [place] [phone] 2 1 1 1           [facility] [address] [place], [place] [phone] 4 4 5 5   [facility])  [address] [place] [phone] 2 1 2 1   [facility]) [address], [place] [phone]  1 4 3   [facility] [address], [place] [phone] 3 5 4 5   [address] [place], [place] [phone] 3 2 2 2   [facility]) [address] [place] [phone] 2 1 3 2      Expected Discharge Plan: Skilled Nursing Facility Barriers to Discharge: Continued Medical Work up, English as a second language teacher, SNF Pending bed offer   Patient Goals and CMS Choice Patient states their goals for this hospitalization and ongoing recovery are:: Return home CMS Medicare.gov Compare Post Acute Care list provided to:: Patient Choice offered to / list presented to : Patient, Parent [place] ownership interest in [facility].provided to:: Patient    Expected Discharge Plan and Services In-house Referral: Clinical Social Work   Post Acute Care Choice: Home Health, Skilled Nursing Facility Living arrangements for the past 2 months: Single Family Home                                      Prior Living Arrangements/Services Living arrangements for the past 2 months: Single Family Home Lives with:: Siblings Patient language and need for interpreter reviewed:: Yes Do you feel safe going back to the place where you live?: Yes      Need for Family Participation in Patient Care: Yes (Comment) Care giver support system in place?: Yes (comment)   Criminal Activity/Legal Involvement Pertinent to Current Situation/Hospitalization: No - Comment as needed  Activities of Daily Living Home Assistive Devices/Equipment: Feeding equipment ADL Screening (condition at time of admission) Patient's cognitive ability adequate to safely complete daily activities?: Yes Is the patient deaf or have difficulty hearing?: No Does the patient have difficulty seeing, even when wearing glasses/contacts?: No Does the patient have difficulty concentrating, remembering, or making decisions?:  No Patient able to express need for assistance with ADLs?: Yes Does the patient have difficulty dressing or bathing?: No Independently performs ADLs?: Yes (appropriate for developmental age) Does the patient have difficulty walking or climbing stairs?: No Weakness of Legs: Both Weakness of Arms/Hands: None  Permission Sought/Granted Permission sought to share information with : Facility Medical sales representative, Family Supports Permission granted to share information with : Yes, Verbal Permission Granted  Share Information with NAME: [name]/Mary  Permission granted to share info w AGENCY: SNFs  Permission granted to share info w Relationship: Sister (poa)/mother  Permission granted to share info w Contact Information: [phone]  Emotional Assessment Appearance:: Appears stated age Attitude/Demeanor/Rapport: Engaged Affect (typically observed): Appropriate Orientation: : Oriented to Self, Oriented to Place, Oriented to  Time,  Oriented to Situation Alcohol / Substance Use: Not Applicable Psych Involvement: No (comment)  Admission diagnosis:  Diarrhea of infectious origin [A09] SIRS (systemic inflammatory response syndrome) (HCC) [R65.10] Infectious diarrhea [A09] Sepsis, due to unspecified organism, unspecified whether acute organ dysfunction present [facility]) [A41.9] Patient Active Problem List   Diagnosis Date Noted   Diarrhea 01/23/2023   SIRS (systemic inflammatory response syndrome) (HCC) 01/23/2023   Chronic respiratory failure with hypoxia (HCC) 01/23/2023   Dysphagia 01/23/2023   Chronic systolic CHF (congestive heart failure) (HCC) 01/23/2023   Acute on chronic respiratory failure with hypoxia (HCC) 12/07/2022   Tracheostomy status (HCC) 12/07/2022   Generalized anxiety disorder 12/06/2022   Acute systolic heart failure (HCC) 11/26/2022   Drug-induced torsades de pointes (HCC) 11/17/2022   Acute systolic CHF (congestive heart failure) (HCC) 11/16/2022   Acute hypoxemic  respiratory failure (HCC) 11/15/2022   Acute on chronic combined systolic and diastolic CHF (congestive heart failure) (HCC) 11/15/2022   Protein-calorie malnutrition, severe 11/15/2022   Cardiac arrest (HCC) 11/14/2022   Healthcare-associated pneumonia 11/11/2022   Hypothyroidism 11/07/2021   Thyroid nodule 04/21/2021   S/P total thyroidectomy 04/21/2021   Sinus node dysfunction (HCC) 03/10/2020   Weakness of both legs    Watery eyes    Sickle cell trait (HCC)    Presence of permanent cardiac pacemaker    Excess ear wax    Depression    Anemia    Cardiac pacemaker in situ 03/01/2017   Mobitz type 2 second degree heart block 02/15/2017   Symptomatic advanced heart block 02/12/2017   SOB (shortness of breath) 10/02/2014   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   Bifascicular block 10/02/2014   Infection due to trichomonas vaginalis 09/15/2014   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   PCP:  Shayne Alken, MD Pharmacy:   [phone] , [place] - [address] AT [facility] [address] [place] [address] Phone: [phone] Fax: [phone]     Social Determinants of Health (SDOH) Social History: SDOH Screenings   Food Insecurity: No Food Insecurity (01/23/2023)  Housing: Low Risk  (01/23/2023)  Transportation Needs: No Transportation Needs (01/23/2023)  Utilities: Not At Risk (01/23/2023)  Alcohol Screen: Low Risk  (10/16/2022)  Depression (PHQ2-9): Medium Risk (11/08/2022)  Financial Resource Strain: Low Risk  (10/16/2022)  Physical Activity: Inactive (10/16/2022)  Social Connections: Socially Isolated (10/16/2022)  Stress: Stress Concern Present (10/16/2022)  Tobacco Use: High Risk (01/23/2023)   SDOH Interventions:     Readmission Risk Interventions     No data to display

## 2023-01-24 NOTE — Progress Notes (Signed)
Physical Therapy Evaluation Patient Details Name: Cynthia Underwood MRN: [id number] DOB: 1971-01-28 Today's Date: 01/24/2023  History of Present Illness  52 yo female with onset of fever and diarrhea was admitted on 5/20 and noted SIRS, pleural effusion, bronchiectasis, bibasilar infiltrates.  Sepsis and SIRS from CAP. Has suspected c-diff.  PMHx: malnutrition, hypothyroidism, anxiety, myotonic dystrophy, dysphagia, O2 dependence, GERD, CHF with EF 20%, pacer, heart block, chronic respiratory failure, hypoxia, trach 11/2022  Clinical Impression  Pt was seen for mobility on side of bed and to stand with assist to balance, to maneuver with monitoring of sats.  Pt is fairly weak, asking to go home with family and friend help, but will need to determine if this is a realistic plan for her.  Follow her for progression with standing balance and transfers, to see how walking is tolerated with sats and to look at her quality of gait.  Pt is reporting no use of assist to walk short trips but presently needs some help.  Will recommend <3 hours a day inpt rehab, but also that family be contacted to see if her request to go directly home can be accommodated.  Follow acute PT goals as are outlined below.       Recommendations for follow up therapy are one component of a multi-disciplinary discharge planning process, led by the attending physician.  Recommendations may be updated based on patient status, additional functional criteria and insurance authorization.  Follow Up Recommendations       Assistance Recommended at Discharge Frequent or constant Supervision/Assistance  Patient can return home with the following  A little help with walking and/or transfers;A little help with bathing/dressing/bathroom;Assistance with cooking/housework;Assist for transportation;Help with stairs or ramp for entrance    Equipment Recommendations None recommended by PT  Recommendations for Other Services       Functional  Status Assessment Patient has had a recent decline in their functional status and demonstrates the ability to make significant improvements in function in a reasonable and predictable amount of time.     Precautions / Restrictions Precautions Precautions: Fall Restrictions Weight Bearing Restrictions: No  Watch sats with mobility     Mobility  Bed Mobility Overal bed mobility: Needs Assistance Bed Mobility: Sit to Supine       Sit to supine: Min guard, Min assist   General bed mobility comments: min assist to finish repositioning legs and adjust bed and pillows    Transfers Overall transfer level: Needs assistance Equipment used: 1 person hand held assist Transfers: Sit to/from Stand, Bed to chair/wheelchair/BSC Sit to Stand: Min assist   Step pivot transfers: Min assist       General transfer comment: min assist to get from [facility] to bed and to stand from bed    Ambulation/Gait Ambulation/Gait assistance: Min guard, Min assist Gait Distance (Feet): 12 Feet Assistive device: 1 person hand held assist Gait Pattern/deviations: Step-to pattern, Decreased stride length, Narrow base of support Gait velocity: reduced Gait velocity interpretation: <1.31 ft/sec, indicative of household ambulator Pre-gait activities: standing balance ck General Gait Details: pt sidestepped on bed due to limited control of sats, had been off O2 for her time on [facility] prior to PT arrival.  Sats were down to 83% after that, very slow to recover to 90%  Stairs            Wheelchair Mobility    Modified Rankin (Stroke Patients Only)       Balance Overall balance assessment: Needs assistance Sitting-balance support:  Feet supported Sitting balance-Leahy Scale: Fair     Standing balance support: Single extremity supported Standing balance-Leahy Scale: Fair Standing balance comment: requires minor support to balance when stepping, lines and O2 are an issue                              Pertinent Vitals/Pain Pain Assessment Pain Assessment: Faces Faces Pain Scale: No hurt    Home Living Family/patient expects to be discharged to:: Private residence Living Arrangements: Other relatives Available Help at Discharge: Family;Friend(s);Available PRN/intermittently Type of Home: House Home Access: Level entry       Home Layout: Two level;Able to live on main level with bedroom/bathroom Home Equipment: Rolling Walker (2 wheels)      Prior Function Prior Level of Function : Needs assist       Physical Assist : Mobility (physical) Mobility (physical): Gait   Mobility Comments: walking very short trips in house, furniture walking       Hand Dominance   Dominant Hand: Right    Extremity/Trunk Assessment   Upper Extremity Assessment Upper Extremity Assessment: Generalized weakness    Lower Extremity Assessment Lower Extremity Assessment: Generalized weakness    Cervical / Trunk Assessment Cervical / Trunk Assessment: Kyphotic (mild)  Communication   Communication: Expressive difficulties (low speech volume)  Cognition Arousal/Alertness: Awake/alert Behavior During Therapy: WFL for tasks assessed/performed, Anxious Overall Cognitive Status: Within Functional Limits for tasks assessed                                 General Comments: Pt is struggling with details of previous rehab stay and concerned that she not go back to same venue, "they would not let me move"        General Comments General comments (skin integrity, edema, etc.): Pt is up to move after giving her time to recover her sats from being off O2, dropping quickly to 89% wiht effort even on 2L    Exercises     Assessment/Plan    PT Assessment Patient needs continued PT services  PT Problem List Decreased strength;Decreased activity tolerance;Decreased balance;Decreased mobility;Decreased coordination;Cardiopulmonary status limiting activity       PT  Treatment Interventions DME instruction;Gait training;Functional mobility training;Therapeutic activities;Therapeutic exercise;Balance training;Neuromuscular re-education;Patient/family education    PT Goals (Current goals can be found in the Care Plan section)  Acute Rehab PT Goals Patient Stated Goal: to go directly home PT Goal Formulation: With patient Time For Goal Achievement: 02/07/23 Potential to Achieve Goals: Good    Frequency Min 3X/week     Co-evaluation               AM-PAC PT "6 Clicks" Mobility  Outcome Measure Help needed turning from your back to your side while in a flat bed without using bedrails?: A Little Help needed moving from lying on your back to sitting on the side of a flat bed without using bedrails?: A Little Help needed moving to and from a bed to a chair (including a wheelchair)?: A Little Help needed standing up from a chair using your arms (e.g., wheelchair or bedside chair)?: A Little Help needed to walk in hospital room?: A Little Help needed climbing 3-5 steps with a railing? : Total 6 Click Score: 16    End of Session Equipment Utilized During Treatment: Gait belt;Oxygen Activity Tolerance: Patient limited by fatigue;Treatment limited secondary to  medical complications (Comment) Patient left: in bed;with call bell/phone within reach;with bed alarm set Nurse Communication: Mobility status PT Visit Diagnosis: Unsteadiness on feet (R26.81);Muscle weakness (generalized) (M62.81);Difficulty in walking, not elsewhere classified (R26.2)    Time: 1610-9604 PT Time Calculation (min) (ACUTE ONLY): 36 min   Charges:   PT Evaluation $PT Eval Moderate Complexity: 1 Mod PT Treatments $Therapeutic Activity: 8-22 mins       Ivar Drape 01/24/2023, 1:19 PM  Samul Dada, PT PhD Acute Rehab Dept. Number: [facility] [id number] and [facility] [phone]

## 2023-01-25 ENCOUNTER — Inpatient Hospital Stay (HOSPITAL_COMMUNITY): Payer: 59

## 2023-01-25 DIAGNOSIS — R6510 Systemic inflammatory response syndrome (SIRS) of non-infectious origin without acute organ dysfunction: Secondary | ICD-10-CM | POA: Diagnosis not present

## 2023-01-25 LAB — GLUCOSE, CAPILLARY
Glucose-Capillary: 100 mg/dL — ABNORMAL HIGH (ref 70–99)
Glucose-Capillary: 107 mg/dL — ABNORMAL HIGH (ref 70–99)
Glucose-Capillary: 82 mg/dL (ref 70–99)
Glucose-Capillary: 89 mg/dL (ref 70–99)
Glucose-Capillary: 90 mg/dL (ref 70–99)
Glucose-Capillary: 99 mg/dL (ref 70–99)

## 2023-01-25 LAB — CBC WITH DIFFERENTIAL/PLATELET
Abs Immature Granulocytes: 0.01 10*3/uL (ref 0.00–0.07)
Basophils Absolute: 0 10*3/uL (ref 0.0–0.1)
Basophils Relative: 1 %
Eosinophils Absolute: 0.1 10*3/uL (ref 0.0–0.5)
Eosinophils Relative: 2 %
HCT: 34.3 % — ABNORMAL LOW (ref 36.0–46.0)
Hemoglobin: 10.6 g/dL — ABNORMAL LOW (ref 12.0–15.0)
Immature Granulocytes: 0 %
Lymphocytes Relative: 38 %
Lymphs Abs: 2 10*3/uL (ref 0.7–4.0)
MCH: 30.4 pg (ref 26.0–34.0)
MCHC: 30.9 g/dL (ref 30.0–36.0)
MCV: 98.3 fL (ref 80.0–100.0)
Monocytes Absolute: 0.4 10*3/uL (ref 0.1–1.0)
Monocytes Relative: 8 %
Neutro Abs: 2.7 10*3/uL (ref 1.7–7.7)
Neutrophils Relative %: 51 %
Platelets: 231 10*3/uL (ref 150–400)
RBC: 3.49 MIL/uL — ABNORMAL LOW (ref 3.87–5.11)
RDW: 16.2 % — ABNORMAL HIGH (ref 11.5–15.5)
WBC: 5.2 10*3/uL (ref 4.0–10.5)
nRBC: 0 % (ref 0.0–0.2)

## 2023-01-25 LAB — GASTROINTESTINAL PANEL BY PCR, STOOL (REPLACES STOOL CULTURE)

## 2023-01-25 LAB — BASIC METABOLIC PANEL
Anion gap: 7 (ref 5–15)
BUN: 12 mg/dL (ref 6–20)
CO2: 29 mmol/L (ref 22–32)
Calcium: 8.8 mg/dL — ABNORMAL LOW (ref 8.9–10.3)
Chloride: 105 mmol/L (ref 98–111)
Creatinine, Ser: 0.45 mg/dL (ref 0.44–1.00)
GFR, Estimated: >60 mL/min (ref >60)
Glucose, Bld: 98 mg/dL (ref 70–99)
Potassium: 3.8 mmol/L (ref 3.5–5.1)
Sodium: 141 mmol/L (ref 135–145)

## 2023-01-25 LAB — C-REACTIVE PROTEIN: CRP: 4.2 mg/dL — ABNORMAL HIGH (ref <1.0)

## 2023-01-25 LAB — MAGNESIUM: Magnesium: 2.1 mg/dL (ref 1.7–2.4)

## 2023-01-25 LAB — BRAIN NATRIURETIC PEPTIDE: B Natriuretic Peptide: 40.1 pg/mL (ref 0.0–100.0)

## 2023-01-25 LAB — PROCALCITONIN: Procalcitonin: <0.1 ng/mL

## 2023-01-25 NOTE — Plan of Care (Signed)

## 2023-01-25 NOTE — Progress Notes (Signed)
PROGRESS NOTE                                                                                                                                                                                                             Patient Demographics:    Cynthia Underwood, is a 52 y.o. female, DOB - 16-Dec-1970, ZOX:[id number]  Outpatient Primary MD for the patient is Shayne Alken, MD    LOS - 2  Admit date - 01/22/2023    Chief Complaint  Patient presents with   Weakness   Diarrhea       Brief Narrative (HPI from H&P)    52 y.o. female with medical history significant of myotonic dystrophy, hypothyroidism, anxiety, heart block with pacer, HFrEF, and admission in 11/2022 for pneumonia with hypoxic respiratory failure failure that was complicated by VF arrest, was recently hospitalized to the [facility], she was discharged from the [facility] on 01/19/2023.  She now presents with ongoing cough, diarrhea and low-grade fevers.  Diagnosed with aspiration pneumonia and admitted to the hospital, patient claims that prior to her discharge she had few episodes of nausea and vomiting at the [facility] facility.   Subjective:   Patient in bed, appears comfortable, denies any headache, no fever, no chest pain or pressure, no shortness of breath , no abdominal pain. No new focal weakness.   Assessment  & Plan :   Aspiration pneumonia.  No sepsis.  Had some emesis prior to her discharge from the LTAC facility and likely aspirated due to that, much improved on empiric antibiotic, already n.p.o. with tube feeds via PEG tube which will be continued.  Speech to evaluate, encouraged to sit in chair use I-S and flutter valve.  Follow cultures.  Chronic hypoxic respiratory failure.  On baseline 2 L nasal cannula.    Diarrhea.  Currently resolved.  Monitor.  No bowel movements since she is on the floor for the  last 14 hours.  History of chronic systolic heart failure EF 20% due to nonischemic cardiomyopathy. History of V-fib arrest - March 2024.  Follows with Dr. Marca Ancona.  Currently compensated.  She is on digoxin at baseline which will be continued.  V-fib arrest was thought to be due to reversible causes by the cardiology team hence defibrillator was not pursued.  High-grade  heart block.  Has pacemaker.    History of myotonic dystrophy.  Generalized weakness, dysphagia, previous history of trach now decannulated with healed stoma.    Dysphagia -  with PEG tube feed.  Anxiety.  On Seroquel.  Hypothyroidism.  Continue Synthroid.  Stable TSH.  GERD.  On PPI.  Possible artifact versus foreign body in the GI tract noted on CT scan in the ER.  Will check with GI.  Symptom-free.        Condition - Extremely Guarded  Family Communication  :  message left for [name] [phone], 01/24/2023 at 9:20 AM  Code Status :  Full  Consults  :  None  PUD Prophylaxis : PPI   Procedures  :     CT - 1. No acute intra-abdominal pathology identified. No definite radiographic explanation for the patient's reported symptoms. 2. Pull-through type gastrostomy catheter in appropriate position. 3. 2 cm linear metallic density intraluminally within the bowel within the right hemipelvis. This may represent an ingested foreign body. No surrounding inflammatory stranding or loculated fluid collection to suggest perforation. 4. Persistent dense collapse and consolidation of the lower lobes bilaterally with marked volume loss. Again noted is bronchiectasis and multifocal airway impaction within the collapsed segments. While this appears stable since prior examination, this is new since remote prior examination of 11/11/2022.      Disposition Plan  :    Status is: Inpatient  DVT Prophylaxis  :    enoxaparin (LOVENOX) injection 40 mg Start: 01/23/23 0915    Lab Results  Component Value Date   PLT 231  01/25/2023    Diet :  Diet Order             Diet NPO time specified Except for: Ice Chips  Diet effective now                    Inpatient Medications  Scheduled Meds:  digoxin  0.25 mg Per Tube Daily   doxycycline  100 mg Oral Q12H   enoxaparin (LOVENOX) injection  40 mg Subcutaneous Q24H   feeding supplement (PROSource TF20)  60 mL Per Tube Daily   free water  170 mL Per Tube Q6H   guaiFENesin  400 mg Oral TID   levothyroxine  112 mcg Per Tube Q0600   metoCLOPramide  5 mg Per Tube BID WC   metoprolol tartrate  12.5 mg Per Tube BID   pantoprazole  40 mg Oral Daily   QUEtiapine  25 mg Per Tube QHS   saccharomyces boulardii  250 mg Per Tube BID   sodium chloride flush  3 mL Intravenous Q12H   spironolactone  12.5 mg Per Tube Daily   Continuous Infusions:  cefTRIAXone (ROCEPHIN)  IV 2 g (01/24/23 2205)   feeding supplement (JEVITY 1.5 CAL/FIBER) 1,000 mL (01/24/23 1806)   PRN Meds:.acetaminophen **OR** acetaminophen, albuterol    Objective:   Vitals:   01/25/23 0003 01/25/23 0422 01/25/23 0800 01/25/23 0820  BP: 94/68 96/63 104/68   Pulse: 69 69 69 71  Resp: 19 18 16    Temp: 97.7 F (36.5 C) 98.2 F (36.8 C) 98.6 F (37 C)   TempSrc: Oral Oral Oral   SpO2: 99% 99% 98%   Weight:        Wt Readings from Last 3 Encounters:  01/24/23 44.8 kg  12/06/22 39.9 kg  11/08/22 44.7 kg    No intake or output data in the 24 hours ending 01/25/23 0916  Physical Exam  Weak and frail middle-aged African-American female, awake Alert, No new F.N deficits, trach site stoma has healed, PEG tube in place [place].AT,PERRAL Supple Neck, No JVD,   Symmetrical Chest wall movement, Good air movement bilaterally, CTAB RRR,No Gallops,Rubs or new Murmurs,  +ve B.Sounds, Abd Soft, No tenderness,   No Cyanosis, Clubbing or edema        Data Review:    Recent Labs  Lab 01/18/23 0942 01/22/23 2311 01/24/23 0415 01/25/23 0402  WBC 6.8 20.0* 7.9 5.2  HGB 11.8* 12.1  9.7* 10.6*  HCT 37.8 38.6 30.7* 34.3*  PLT 286 255 206 231  MCV 97.2 99.2 97.5 98.3  MCH 30.3 31.1 30.8 30.4  MCHC 31.2 31.3 31.6 30.9  RDW 16.0* 16.0* 16.1* 16.2*  LYMPHSABS  --   --   --  2.0  MONOABS  --   --   --  0.4  EOSABS  --   --   --  0.1  BASOSABS  --   --   --  0.0    Recent Labs  Lab 01/18/23 0942 01/22/23 2311 01/24/23 0415 01/24/23 0645 01/25/23 0402  NA 140 136 138  --  141  K 4.3 4.0 3.4*  --  3.8  CL 101 100 103  --  105  CO2 29 25 28   --  29  ANIONGAP 10 11 7   --  7  GLUCOSE 92 120* 94  --  98  BUN 15 12 10   --  12  CREATININE 0.50 0.55 0.48  --  0.45  AST  --  24  --   --   --   ALT  --  39  --   --   --   ALKPHOS  --  71  --   --   --   BILITOT  --  0.4  --   --   --   ALBUMIN  --  2.6*  --   --   --   CRP  --   --   --  11.1* 4.2*  PROCALCITON  --  0.11  --  <0.10 <0.10  LATICACIDVEN  --  1.3  --   --   --   BNP  --   --   --  54.9 40.1  MG 2.1  --   --  2.3 2.1  CALCIUM 9.2 8.9 8.6*  --  8.8*      Recent Labs  Lab 01/18/23 0942 01/22/23 2311 01/24/23 0415 01/24/23 0645 01/25/23 0402  CRP  --   --   --  11.1* 4.2*  PROCALCITON  --  0.11  --  <0.10 <0.10  LATICACIDVEN  --  1.3  --   --   --   BNP  --   --   --  54.9 40.1  MG 2.1  --   --  2.3 2.1  CALCIUM 9.2 8.9 8.6*  --  8.8*     Lab Results  Component Value Date   HGBA1C 5.6 11/15/2022    Radiology Reports DG Abd 1 View  Result Date: 01/24/2023 CLINICAL DATA:  Foreign body. EXAM: ABDOMEN - 1 VIEW COMPARISON:  Jan 05, 2023. FINDINGS: The bowel gas pattern is normal. Gastrostomy tube is seen well positioned over gastric air shadow. No radio-opaque calculi or other significant radiographic abnormality are seen. IMPRESSION: Gastrostomy tube is noted.  No abnormal bowel dilatation. Electronically Signed   By: Lupita Raider M.D.   On:  01/24/2023 13:05   DG Chest Port 1 View  Result Date: 01/24/2023 CLINICAL DATA:  Shortness of breath. EXAM: PORTABLE CHEST 1 VIEW COMPARISON:   01/22/2023 FINDINGS: Persistent right pleural effusion and bibasilar infiltrates. Stable pacer wires.  Stable surgical changes in the neck. IMPRESSION: Persistent right pleural effusion and bibasilar infiltrates. Electronically Signed   By: Rudie Meyer M.D.   On: 01/24/2023 07:08   CT ABDOMEN PELVIS W CONTRAST  Result Date: 01/23/2023 CLINICAL DATA:  Abdominal pain, acute, nonlocalized, diarrhea, hypotension EXAM: CT ABDOMEN AND PELVIS WITH CONTRAST TECHNIQUE: Multidetector CT imaging of the abdomen and pelvis was performed using the standard protocol following bolus administration of intravenous contrast. RADIATION DOSE REDUCTION: This exam was performed according to the departmental dose-optimization program which includes automated exposure control, adjustment of the mA and/or kV according to patient size and/or use of iterative reconstruction technique. CONTRAST:  75mL OMNIPAQUE IOHEXOL 350 MG/ML SOLN COMPARISON:  12/26/2022, CT chest 11/11/2022 FINDINGS: Lower chest: There is persistent dense collapse and consolidation of the lower lobes bilaterally with marked volume loss. Again noted is bronchiectasis and multifocal airway impaction within the collapsed segments. While this appears stable since prior examination, this is new since remote prior examination of 11/11/2022. Pacemaker leads noted within the right heart. Cardiac size within normal limits. Hepatobiliary: No focal liver abnormality is seen. No gallstones, gallbladder wall thickening, or biliary dilatation. Pancreas: Unremarkable Spleen: Unremarkable Adrenals/Urinary Tract: The adrenal glands are unremarkable. The kidneys are normal in size and position. Multiple simple cortical cysts are seen within the kidneys bilaterally. No follow-up imaging is recommended for these lesions. The kidneys are otherwise unremarkable. The bladder is unremarkable. Stomach/Bowel: Pull-through type gastrostomy catheter in appropriate position within the mid to  distal body of the stomach. A 2 cm linear metallic density is seen intraluminally within the bowel within the right hemipelvis. There is no surrounding inflammatory stranding or loculated fluid collection to suggest perforation. The stomach, small bowel, and large bowel are otherwise unremarkable. Appendix normal. No free intraperitoneal gas or fluid. Vascular/Lymphatic: No significant vascular findings are present. No enlarged abdominal or pelvic lymph nodes. Reproductive: Uterus and bilateral adnexa are unremarkable. Other: No abdominal wall hernia Musculoskeletal: Bilateral L5 pars defects are present without associated spondylolisthesis. No acute bone abnormality. No lytic or blastic bone lesion. IMPRESSION: 1. No acute intra-abdominal pathology identified. No definite radiographic explanation for the patient's reported symptoms. 2. Pull-through type gastrostomy catheter in appropriate position. 3. 2 cm linear metallic density intraluminally within the bowel within the right hemipelvis. This may represent an ingested foreign body. No surrounding inflammatory stranding or loculated fluid collection to suggest perforation. 4. Persistent dense collapse and consolidation of the lower lobes bilaterally with marked volume loss. Again noted is bronchiectasis and multifocal airway impaction within the collapsed segments. While this appears stable since prior examination, this is new since remote prior examination of 11/11/2022. Electronically Signed   By: Helyn Numbers M.D.   On: 01/23/2023 03:06   DG Chest 2 View  Result Date: 01/22/2023 CLINICAL DATA:  Weakness cough EXAM: CHEST - 2 VIEW COMPARISON:  01/05/2023, CT 11/11/2022, radiograph 01/12/2023 FINDINGS: Suspicion of right pleural effusion. Worsening bilateral lung base airspace disease compared to prior. Stable cardiomediastinal silhouette. No pneumothorax.Left-sided pacing device as before. IMPRESSION: Suspect small right pleural effusion. Worsening  bilateral lung base airspace disease compared to prior Electronically Signed   By: Jasmine Pang M.D.   On: 01/22/2023 23:31      Signature  -   Bess Harvest  Thedore Mins M.D on 01/25/2023 at 9:16 AM   -  To page go to [email or web address]

## 2023-01-25 NOTE — Progress Notes (Signed)
Modified Barium Swallow Study  Patient Details  Name: Cynthia Underwood MRN: [id number] Date of Birth: May 16, 1971  Today's Date: 01/25/2023  Modified Barium Swallow completed.  Full report located under Chart Review in the Imaging Section.  History of Present Illness Patient is a 52 y.o. female with PMH: myotonic dystrophy, chronic dysphagia, hypothyroidism, total thyroidectomy 2022, anxiety, heart block with pacer, HFrEF. She was recently admitted in March 2024 for PNA and hypoxic respiratory failure complicated by VF arrest, requiring intubation then trach placement. She was transferred from [facility] where she had PEG placed due to severe dysphagia, was decannulated and ultimately dischaged home on 01/19/23. She presented to the hospital for current admission, on 01/23/23 due to ongoing cough, diarrhea and low grade fevers. She has long-standing hx of dysphagia that was first identified when she was living in [place]. 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  She had worsening of dysphagia and in 10/2020 was found to have a mulitnodular thyroid goiter. CT cited mild to moderate mass effect upon the esophagus.  Esophagram 10/21/20: "Marked difficulty in initiating swallowing with delayed oropharyngeal phase and large amount of residue in the piriform sinuses and vallecula. No frank aspiration seen. Mild narrowing in the lower cervical esophagus, likely related to thyroid goiter."  She underwent a total thyroidectomy 04/24/21.  She had an MBS while admitted to [facility] which revealed severe dysphagia. She currently takes only ice and small sips of water by mouth; otherwise all nutrition via G-tube.   Clinical Impression Pt presents with a severe dysphagia that is primarily related to her dx of myotonic dystrophy type 1 with potential secondary effects from her 2022 total thyroidectomy. The swallow is impacted by nearly absent pharyngeal stripping wave and minimal base-of-tongue  retraction.  There is reduced laryngeal elevation.  These factors lead to minimal movement of POs through the pharynx.  She was able to protect her airway surprisingly well, with only high/transient penetration of thin liquids into the laryngeal vestibule, no aspiration, but repetitive effort and subswallows necessary in order to transfer small portions of thin liquid boluses through her UES.  There was almost total stasis of purees in pharynx, requiring oral suctioning and great effort to expectorate/remove barium.  Continue NPO and TF as primary source of nutrition; allow small sips of water/ice chips per pt's preferences. With therapy, she may be able to drink larger volumes of thin liquid in the future.  SLP will follow while admiited; she will need intensive therapy  after discharge.  Factors that may increase risk of adverse event in presence of aspiration Cynthia Underwood & Cynthia Underwood 2021): Frail or deconditioned;Weak cough;Limited mobility  Swallow Evaluation Recommendations Recommendations: NPO;Ice chips PRN after oral care Medication Administration: Via alternative means Supervision: Patient able to self-feed Oral care recommendations: Oral care QID (4x/day);Oral care before ice chips/water    Cynthia Underwood L. Samson Frederic, MA CCC/SLP Clinical Specialist - Acute Care SLP Acute Rehabilitation Services Office number [phone]   Cynthia Underwood 01/25/2023,3:32 PM

## 2023-01-25 NOTE — NC FL2 (Signed)
[place] MEDICAID FL2 LEVEL OF CARE FORM     IDENTIFICATION  Patient Name: Cynthia Underwood Birthdate: 14-Sep-1970 Sex: female Admission Date (Current Location): 01/22/2023  [facility] and [place] Number:  Producer, television/film/video and Address:  [facility], [address] [place], [place] [address]      Provider Number: [id number]  Attending Physician Name and Address:  Leroy Sea, MD  Relative Name and Phone Number:       Current Level of Care: Hospital Recommended Level of Care: Skilled Nursing Facility Prior Approval Number:    Date Approved/Denied:   PASRR Number: [id number]  Discharge Plan: SNF    Current Diagnoses: Patient Active Problem List   Diagnosis Date Noted   Diarrhea 01/23/2023   SIRS (systemic inflammatory response syndrome) (HCC) 01/23/2023   Chronic respiratory failure with hypoxia (HCC) 01/23/2023   Dysphagia 01/23/2023   Chronic systolic CHF (congestive heart failure) (HCC) 01/23/2023   Acute on chronic respiratory failure with hypoxia (HCC) 12/07/2022   Tracheostomy status (HCC) 12/07/2022   Generalized anxiety disorder 12/06/2022   Acute systolic heart failure (HCC) 11/26/2022   Drug-induced torsades de pointes (HCC) 11/17/2022   Acute systolic CHF (congestive heart failure) (HCC) 11/16/2022   Acute hypoxemic respiratory failure (HCC) 11/15/2022   Acute on chronic combined systolic and diastolic CHF (congestive heart failure) (HCC) 11/15/2022   Protein-calorie malnutrition, severe 11/15/2022   Cardiac arrest (HCC) 11/14/2022   Healthcare-associated pneumonia 11/11/2022   Hypothyroidism 11/07/2021   Thyroid nodule 04/21/2021   S/P total thyroidectomy 04/21/2021   Sinus node dysfunction (HCC) 03/10/2020   Weakness of both legs    Watery eyes    Sickle cell trait (HCC)    Presence of permanent cardiac pacemaker    Excess ear wax    Depression    Anemia    Cardiac pacemaker in situ 03/01/2017   Mobitz type 2  second degree heart block 02/15/2017   Symptomatic advanced heart block 02/12/2017   SOB (shortness of breath) 10/02/2014   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   Bifascicular block 10/02/2014   Infection due to trichomonas vaginalis 09/15/2014   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    Orientation RESPIRATION BLADDER Height & Weight     Self, Time, Situation, Place  O2 (2L Nasal cannula) Continent Weight: 98 lb 12.3 oz (44.8 kg) Height:     BEHAVIORAL SYMPTOMS/MOOD NEUROLOGICAL BOWEL NUTRITION STATUS      Continent Feeding tube (feeding supplement (JEVITY 1.5 CAL/FIBER) liquid 1,000 mL)  AMBULATORY STATUS COMMUNICATION OF NEEDS Skin   Limited Assist Verbally Normal                       Personal Care Assistance Level of Assistance  Bathing, Feeding, Dressing Bathing Assistance: Limited assistance Feeding assistance: Independent Dressing Assistance: Limited assistance     Functional Limitations Info             SPECIAL CARE FACTORS FREQUENCY  PT (By licensed PT), OT (By licensed OT)     PT Frequency: 5x/week OT Frequency: 5x/week            Contractures Contractures Info: Not present    Additional Factors Info  Code Status, Allergies, Psychotropic, Isolation Precautions Code Status Info: Full Allergies Info: Penicillin G, Penicillins Psychotropic Info: Seroquel   Isolation Precautions Info: Enteric precautions     Current Medications (01/25/2023):  This is the current hospital active medication list Current  Facility-Administered Medications  Medication Dose Route Frequency Provider Last Rate Last Admin   acetaminophen (TYLENOL) tablet 650 mg  650 mg Per Tube Q6H PRN Clydie Braun, MD       Or   acetaminophen (TYLENOL) suppository 650 mg  650 mg Rectal Q6H PRN Madelyn Flavors A, MD       albuterol (PROVENTIL) (2.5 MG/3ML) 0.083% nebulizer solution 2.5 mg  2.5 mg Nebulization Q4H PRN Smith, Rondell A, MD        cefTRIAXone (ROCEPHIN) 2 g in sodium chloride 0.9 % 100 mL IVPB  2 g Intravenous Q24H Smith, Rondell A, MD 200 mL/hr at 01/24/23 2205 2 g at 01/24/23 2205   digoxin (LANOXIN) tablet 0.25 mg  0.25 mg Per Tube Daily Smith, Rondell A, MD   0.25 mg at 01/25/23 0820   doxycycline (VIBRA-TABS) tablet 100 mg  100 mg Oral Q12H Susa Raring K, MD   100 mg at 01/25/23 0821   enoxaparin (LOVENOX) injection 40 mg  40 mg Subcutaneous Q24H Smith, Rondell A, MD   40 mg at 01/25/23 1610   feeding supplement (JEVITY 1.5 CAL/FIBER) liquid 1,000 mL  1,000 mL Per Tube Continuous Leroy Sea, MD 45 mL/hr at 01/24/23 1806 1,000 mL at 01/24/23 1806   feeding supplement (PROSource TF20) liquid 60 mL  60 mL Per Tube Daily Leroy Sea, MD   60 mL at 01/25/23 0821   free water 170 mL  170 mL Per Tube Q6H Leroy Sea, MD   170 mL at 01/25/23 1208   guaiFENesin tablet 400 mg  400 mg Oral TID Madelyn Flavors A, MD   400 mg at 01/25/23 9604   levothyroxine (SYNTHROID) tablet 112 mcg  112 mcg Per Tube Q0600 Madelyn Flavors A, MD   112 mcg at 01/25/23 5409   metoCLOPramide (REGLAN) tablet 5 mg  5 mg Per Tube BID WC Smith, Rondell A, MD   5 mg at 01/25/23 8119   metoprolol tartrate (LOPRESSOR) tablet 12.5 mg  12.5 mg Per Tube BID Madelyn Flavors A, MD   12.5 mg at 01/25/23 0820   pantoprazole (PROTONIX) EC tablet 40 mg  40 mg Oral Daily Smith, Rondell A, MD   40 mg at 01/25/23 0820   QUEtiapine (SEROQUEL) tablet 25 mg  25 mg Per Tube QHS Smith, Rondell A, MD   25 mg at 01/24/23 2155   saccharomyces boulardii (FLORASTOR) capsule 250 mg  250 mg Per Tube BID Madelyn Flavors A, MD   250 mg at 01/25/23 0820   sodium chloride flush (NS) 0.9 % injection 3 mL  3 mL Intravenous Q12H Smith, Rondell A, MD   3 mL at 01/25/23 1478   spironolactone (ALDACTONE) tablet 12.5 mg  12.5 mg Per Tube Daily Madelyn Flavors A, MD   12.5 mg at 01/25/23 0820     Discharge Medications: Please see discharge summary for a list of discharge  medications.  Relevant Imaging Results:  Relevant Lab Results:   Additional Information SSN: [id number]  Mearl Latin, [place]

## 2023-01-26 DIAGNOSIS — R6510 Systemic inflammatory response syndrome (SIRS) of non-infectious origin without acute organ dysfunction: Secondary | ICD-10-CM | POA: Diagnosis not present

## 2023-01-26 LAB — CBC WITH DIFFERENTIAL/PLATELET
Abs Immature Granulocytes: 0.01 10*3/uL (ref 0.00–0.07)
Basophils Absolute: 0 10*3/uL (ref 0.0–0.1)
Basophils Relative: 0 %
Eosinophils Absolute: 0.2 10*3/uL (ref 0.0–0.5)
Eosinophils Relative: 3 %
HCT: 35.5 % — ABNORMAL LOW (ref 36.0–46.0)
Hemoglobin: 11.2 g/dL — ABNORMAL LOW (ref 12.0–15.0)
Immature Granulocytes: 0 %
Lymphocytes Relative: 41 %
Lymphs Abs: 2.2 10*3/uL (ref 0.7–4.0)
MCH: 31 pg (ref 26.0–34.0)
MCHC: 31.5 g/dL (ref 30.0–36.0)
MCV: 98.3 fL (ref 80.0–100.0)
Monocytes Absolute: 0.4 10*3/uL (ref 0.1–1.0)
Monocytes Relative: 7 %
Neutro Abs: 2.5 10*3/uL (ref 1.7–7.7)
Neutrophils Relative %: 49 %
Platelets: 239 10*3/uL (ref 150–400)
RBC: 3.61 MIL/uL — ABNORMAL LOW (ref 3.87–5.11)
RDW: 15.9 % — ABNORMAL HIGH (ref 11.5–15.5)
WBC: 5.2 10*3/uL (ref 4.0–10.5)
nRBC: 0 % (ref 0.0–0.2)

## 2023-01-26 LAB — C-REACTIVE PROTEIN: CRP: 1.9 mg/dL — ABNORMAL HIGH (ref <1.0)

## 2023-01-26 LAB — CULTURE, BLOOD (ROUTINE X 2)

## 2023-01-26 LAB — GLUCOSE, CAPILLARY
Glucose-Capillary: 90 mg/dL (ref 70–99)
Glucose-Capillary: 106 mg/dL — ABNORMAL HIGH (ref 70–99)
Glucose-Capillary: 108 mg/dL — ABNORMAL HIGH (ref 70–99)
Glucose-Capillary: 105 mg/dL — ABNORMAL HIGH (ref 70–99)
Glucose-Capillary: 79 mg/dL (ref 70–99)

## 2023-01-26 LAB — MAGNESIUM: Magnesium: 2.1 mg/dL (ref 1.7–2.4)

## 2023-01-26 LAB — BASIC METABOLIC PANEL
Anion gap: 8 (ref 5–15)
BUN: 10 mg/dL (ref 6–20)
CO2: 30 mmol/L (ref 22–32)
Calcium: 9.1 mg/dL (ref 8.9–10.3)
Chloride: 103 mmol/L (ref 98–111)
Creatinine, Ser: 0.45 mg/dL (ref 0.44–1.00)
GFR, Estimated: >60 mL/min (ref >60)
Glucose, Bld: 86 mg/dL (ref 70–99)
Potassium: 3.6 mmol/L (ref 3.5–5.1)
Sodium: 141 mmol/L (ref 135–145)

## 2023-01-26 LAB — PROCALCITONIN: Procalcitonin: <0.1 ng/mL

## 2023-01-26 LAB — BRAIN NATRIURETIC PEPTIDE: B Natriuretic Peptide: 29.4 pg/mL (ref 0.0–100.0)

## 2023-01-26 MED ORDER — LOPERAMIDE HCL 2 MG PO CAPS: 2.0000 mg | ORAL_CAPSULE | Freq: Four times a day (QID) | ORAL | Status: DC | PRN

## 2023-01-26 MED ORDER — JEVITY 1.5 CAL/FIBER PO LIQD
1000.0000 mL | ORAL | Status: DC
  Administered 2023-01-26 (×2): 1000 mL
  Filled 2023-01-26 (×3): qty 1000

## 2023-01-26 MED ORDER — LOPERAMIDE HCL 2 MG PO CAPS
4.0000 mg | ORAL_CAPSULE | Freq: Once | ORAL | Status: AC
  Administered 2023-01-26: 4 mg via ORAL
  Filled 2023-01-26: qty 2

## 2023-01-26 NOTE — Progress Notes (Signed)
PROGRESS NOTE                                                                                                                                                                                                             Patient Demographics:    Cynthia Underwood, is a 52 y.o. female, DOB - 11/19/1970, ZOX:[id number]  Outpatient Primary MD for the patient is Shayne Alken, MD    LOS - 2  Admit date - 01/22/2023    Chief Complaint  Patient presents with   Weakness   Diarrhea       Brief Narrative (HPI from H&P)    52 y.o. female with medical history significant of myotonic dystrophy, hypothyroidism, anxiety, heart block with pacer, HFrEF, and admission in 11/2022 for pneumonia with hypoxic respiratory failure failure that was complicated by VF arrest, was recently hospitalized to [facility], she was discharged from the [facility] on 01/19/2023.  She now presents with ongoing cough, diarrhea and low-grade fevers.  Diagnosed with aspiration pneumonia and admitted to the hospital, patient claims that prior to her discharge she had few episodes of nausea and vomiting at the [facility] facility.   Subjective:   Patient in bed denies any headache, no chest pain or shortness of breath, her belly feels bloated and she is having nonstop diarrhea since the tube feed was started, no blood or mucus in stool, no new focal weakness.   Assessment  & Plan :   Aspiration pneumonia.  No sepsis.  Had some emesis prior to her discharge from the LTAC facility and likely aspirated due to that, much improved on empiric antibiotic, already n.p.o. with tube feeds via PEG tube which will be continued.  Speech to evaluate, encouraged to sit in chair use I-S and flutter valve.  Follow cultures.  Chronic hypoxic respiratory failure.  On baseline 2 L nasal cannula.    Diarrhea.  Due to tube feeds, Imodium  added, GI pathogen panel stable, tube feed rate reduced as she is feels bloated with constant diarrhea, monitor.  History of chronic systolic heart failure EF 20% due to nonischemic cardiomyopathy. History of V-fib arrest - March 2024.  Follows with Dr. Marca Ancona.  Currently compensated.  She is on digoxin at baseline which will be continued.  V-fib arrest was thought to  be due to reversible causes by the cardiology team hence defibrillator was not pursued.  High-grade heart block.  Has pacemaker.    History of myotonic dystrophy.  Generalized weakness, dysphagia, previous history of trach now decannulated with healed stoma.    Dysphagia -  with PEG tube feed.  Anxiety.  On Seroquel.  Hypothyroidism.  Continue Synthroid.  Stable TSH.  GERD.  On PPI.  Possible artifact versus foreign body in the GI tract noted on CT scan in the ER.  Discussed with GI Dr. Adela Lank subsequent x-ray stable.  No further workup.        Condition - Extremely Guarded  Family Communication  :  message left for [name] [phone], 01/24/2023 at 9:20 AM  Code Status :  Full  Consults  :  None  PUD Prophylaxis : PPI   Procedures  :     CT - 1. No acute intra-abdominal pathology identified. No definite radiographic explanation for the patient's reported symptoms. 2. Pull-through type gastrostomy catheter in appropriate position. 3. 2 cm linear metallic density intraluminally within the bowel within the right hemipelvis. This may represent an ingested foreign body. No surrounding inflammatory stranding or loculated fluid collection to suggest perforation. 4. Persistent dense collapse and consolidation of the lower lobes bilaterally with marked volume loss. Again noted is bronchiectasis and multifocal airway impaction within the collapsed segments. While this appears stable since prior examination, this is new since remote prior examination of 11/11/2022.      Disposition Plan  :    Status is:  Inpatient  DVT Prophylaxis  :    enoxaparin (LOVENOX) injection 40 mg Start: 01/23/23 0915    Lab Results  Component Value Date   PLT 231 01/25/2023    Diet :  Diet Order             Diet NPO time specified Except for: Ice Chips  Diet effective now                    Inpatient Medications  Scheduled Meds:  digoxin  0.25 mg Per Tube Daily   doxycycline  100 mg Oral Q12H   enoxaparin (LOVENOX) injection  40 mg Subcutaneous Q24H   feeding supplement (PROSource TF20)  60 mL Per Tube Daily   free water  170 mL Per Tube Q6H   guaiFENesin  400 mg Oral TID   levothyroxine  112 mcg Per Tube Q0600   metoCLOPramide  5 mg Per Tube BID WC   metoprolol tartrate  12.5 mg Per Tube BID   pantoprazole  40 mg Oral Daily   QUEtiapine  25 mg Per Tube QHS   saccharomyces boulardii  250 mg Per Tube BID   sodium chloride flush  3 mL Intravenous Q12H   spironolactone  12.5 mg Per Tube Daily   Continuous Infusions:  cefTRIAXone (ROCEPHIN)  IV 2 g (01/24/23 2205)   feeding supplement (JEVITY 1.5 CAL/FIBER) 1,000 mL (01/24/23 1806)   PRN Meds:.acetaminophen **OR** acetaminophen, albuterol    Objective:   Vitals:   01/25/23 0003 01/25/23 0422 01/25/23 0800 01/25/23 0820  BP: 94/68 96/63 104/68   Pulse: 69 69 69 71  Resp: 19 18 16    Temp: 97.7 F (36.5 C) 98.2 F (36.8 C) 98.6 F (37 C)   TempSrc: Oral Oral Oral   SpO2: 99% 99% 98%   Weight:        Wt Readings from Last 3 Encounters:  01/24/23 44.8 kg  12/06/22 39.9 kg  11/08/22 44.7 kg    No intake or output data in the 24 hours ending 01/25/23 0916    Physical Exam  Weak and frail middle-aged African-American female, awake Alert, No new F.N deficits, trach site stoma has healed, PEG tube in place [place].AT,PERRAL Supple Neck, No JVD,   Symmetrical Chest wall movement, Good air movement bilaterally, CTAB RRR,No Gallops,Rubs or new Murmurs,  +ve B.Sounds, Abd Soft, No tenderness,   No Cyanosis, Clubbing or edema         Data Review:    Recent Labs  Lab 01/18/23 0942 01/22/23 2311 01/24/23 0415 01/25/23 0402  WBC 6.8 20.0* 7.9 5.2  HGB 11.8* 12.1 9.7* 10.6*  HCT 37.8 38.6 30.7* 34.3*  PLT 286 255 206 231  MCV 97.2 99.2 97.5 98.3  MCH 30.3 31.1 30.8 30.4  MCHC 31.2 31.3 31.6 30.9  RDW 16.0* 16.0* 16.1* 16.2*  LYMPHSABS  --   --   --  2.0  MONOABS  --   --   --  0.4  EOSABS  --   --   --  0.1  BASOSABS  --   --   --  0.0    Recent Labs  Lab 01/18/23 0942 01/22/23 2311 01/24/23 0415 01/24/23 0645 01/25/23 0402  NA 140 136 138  --  141  K 4.3 4.0 3.4*  --  3.8  CL 101 100 103  --  105  CO2 29 25 28   --  29  ANIONGAP 10 11 7   --  7  GLUCOSE 92 120* 94  --  98  BUN 15 12 10   --  12  CREATININE 0.50 0.55 0.48  --  0.45  AST  --  24  --   --   --   ALT  --  39  --   --   --   ALKPHOS  --  71  --   --   --   BILITOT  --  0.4  --   --   --   ALBUMIN  --  2.6*  --   --   --   CRP  --   --   --  11.1* 4.2*  PROCALCITON  --  0.11  --  <0.10 <0.10  LATICACIDVEN  --  1.3  --   --   --   BNP  --   --   --  54.9 40.1  MG 2.1  --   --  2.3 2.1  CALCIUM 9.2 8.9 8.6*  --  8.8*      Recent Labs  Lab 01/18/23 0942 01/22/23 2311 01/24/23 0415 01/24/23 0645 01/25/23 0402  CRP  --   --   --  11.1* 4.2*  PROCALCITON  --  0.11  --  <0.10 <0.10  LATICACIDVEN  --  1.3  --   --   --   BNP  --   --   --  54.9 40.1  MG 2.1  --   --  2.3 2.1  CALCIUM 9.2 8.9 8.6*  --  8.8*     Lab Results  Component Value Date   HGBA1C 5.6 11/15/2022    Radiology Reports DG Abd 1 View  Result Date: 01/24/2023 CLINICAL DATA:  Foreign body. EXAM: ABDOMEN - 1 VIEW COMPARISON:  Jan 05, 2023. FINDINGS: The bowel gas pattern is normal. Gastrostomy tube is seen well positioned over gastric air shadow. No radio-opaque calculi or other significant radiographic abnormality are seen. IMPRESSION: Gastrostomy  tube is noted.  No abnormal bowel dilatation. Electronically Signed   By: Lupita Raider M.D.   On:  01/24/2023 13:05   DG Chest Port 1 View  Result Date: 01/24/2023 CLINICAL DATA:  Shortness of breath. EXAM: PORTABLE CHEST 1 VIEW COMPARISON:  01/22/2023 FINDINGS: Persistent right pleural effusion and bibasilar infiltrates. Stable pacer wires.  Stable surgical changes in the neck. IMPRESSION: Persistent right pleural effusion and bibasilar infiltrates. Electronically Signed   By: Rudie Meyer M.D.   On: 01/24/2023 07:08   CT ABDOMEN PELVIS W CONTRAST  Result Date: 01/23/2023 CLINICAL DATA:  Abdominal pain, acute, nonlocalized, diarrhea, hypotension EXAM: CT ABDOMEN AND PELVIS WITH CONTRAST TECHNIQUE: Multidetector CT imaging of the abdomen and pelvis was performed using the standard protocol following bolus administration of intravenous contrast. RADIATION DOSE REDUCTION: This exam was performed according to the departmental dose-optimization program which includes automated exposure control, adjustment of the mA and/or kV according to patient size and/or use of iterative reconstruction technique. CONTRAST:  75mL OMNIPAQUE IOHEXOL 350 MG/ML SOLN COMPARISON:  12/26/2022, CT chest 11/11/2022 FINDINGS: Lower chest: There is persistent dense collapse and consolidation of the lower lobes bilaterally with marked volume loss. Again noted is bronchiectasis and multifocal airway impaction within the collapsed segments. While this appears stable since prior examination, this is new since remote prior examination of 11/11/2022. Pacemaker leads noted within the right heart. Cardiac size within normal limits. Hepatobiliary: No focal liver abnormality is seen. No gallstones, gallbladder wall thickening, or biliary dilatation. Pancreas: Unremarkable Spleen: Unremarkable Adrenals/Urinary Tract: The adrenal glands are unremarkable. The kidneys are normal in size and position. Multiple simple cortical cysts are seen within the kidneys bilaterally. No follow-up imaging is recommended for these lesions. The kidneys are  otherwise unremarkable. The bladder is unremarkable. Stomach/Bowel: Pull-through type gastrostomy catheter in appropriate position within the mid to distal body of the stomach. A 2 cm linear metallic density is seen intraluminally within the bowel within the right hemipelvis. There is no surrounding inflammatory stranding or loculated fluid collection to suggest perforation. The stomach, small bowel, and large bowel are otherwise unremarkable. Appendix normal. No free intraperitoneal gas or fluid. Vascular/Lymphatic: No significant vascular findings are present. No enlarged abdominal or pelvic lymph nodes. Reproductive: Uterus and bilateral adnexa are unremarkable. Other: No abdominal wall hernia Musculoskeletal: Bilateral L5 pars defects are present without associated spondylolisthesis. No acute bone abnormality. No lytic or blastic bone lesion. IMPRESSION: 1. No acute intra-abdominal pathology identified. No definite radiographic explanation for the patient's reported symptoms. 2. Pull-through type gastrostomy catheter in appropriate position. 3. 2 cm linear metallic density intraluminally within the bowel within the right hemipelvis. This may represent an ingested foreign body. No surrounding inflammatory stranding or loculated fluid collection to suggest perforation. 4. Persistent dense collapse and consolidation of the lower lobes bilaterally with marked volume loss. Again noted is bronchiectasis and multifocal airway impaction within the collapsed segments. While this appears stable since prior examination, this is new since remote prior examination of 11/11/2022. Electronically Signed   By: Helyn Numbers M.D.   On: 01/23/2023 03:06   DG Chest 2 View  Result Date: 01/22/2023 CLINICAL DATA:  Weakness cough EXAM: CHEST - 2 VIEW COMPARISON:  01/05/2023, CT 11/11/2022, radiograph 01/12/2023 FINDINGS: Suspicion of right pleural effusion. Worsening bilateral lung base airspace disease compared to prior. Stable  cardiomediastinal silhouette. No pneumothorax.Left-sided pacing device as before. IMPRESSION: Suspect small right pleural effusion. Worsening bilateral lung base airspace disease compared to prior Electronically Signed  By: Jasmine Pang M.D.   On: 01/22/2023 23:31      Signature  -   Susa Raring M.D on 01/25/2023 at 9:16 AM   -  To page go to [email or web address]

## 2023-01-26 NOTE — Progress Notes (Signed)
Speech Language Pathology Treatment: Dysphagia  Patient Details Name: Cynthia Underwood MRN: [id number] DOB: 1971/03/02 Today's Date: 01/26/2023 Time: 8469-6295 SLP Time Calculation (min) (ACUTE ONLY): 34 min  Assessment / Plan / Recommendation Clinical Impression  Reviewed yesterday's MBS at length with Cynthia Underwood and her mother. We discussed that her swallow mechanism is doing its first job very well - protecting her airway. She did not aspirate. However, her pharyngeal weakness is a classic sign of advancing MD. If material is thicker and more viscous, she does not have the pharyngeal nor posterior tongue strength to move it through her pharynx.  We discussed that her PEG will allow her to finally drink for pleasure without worrying about calorie intake and nutrition.  She asked appropriate questions. She was relieved to finally be able to drink hot tea or coke.  She was left with ginger ale. She was encouraged to sit upright, to take her time. It takes multiple sub-swallows for her to pass even one sip of liquid into her esophagus but she is at very low risk for prandial aspiration, as stated.  No further acute care SLP f/u is needed. Please ensure that she is D/Cd with allowance of ANY LIQUID.  Cynthia Underwood and her mother verbalized understanding. D/W RN.    HPI HPI: Patient is a 52 y.o. female with PMH: myotonic dystrophy, chronic dysphagia, hypothyroidism, total thyroidectomy 2022, anxiety, heart block with pacer, HFrEF. She was recently admitted in March 2024 for PNA and hypoxic respiratory failure complicated by VF arrest, requiring intubation then trach placement. She was transferred from [facility] to [facility] where she had PEG placed due to severe dysphagia, was decannulated and ultimately dischaged home on 01/19/23. She presented to the hospital for current admission, on 01/23/23 due to ongoing cough, diarrhea and low grade fevers. She has long-standing hx of dysphagia that was first identified  when she was living in [place]. 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  She had worsening of dysphagia and in 10/2020 was found to have a mulitnodular thyroid goiter. CT cited mild to moderate mass effect upon the esophagus.  Esophagram 10/21/20: "Marked difficulty in initiating swallowing with delayed oropharyngeal phase and large amount of residue in the piriform sinuses and vallecula. No frank aspiration seen. Mild narrowing in the lower cervical esophagus, likely related to thyroid goiter."  She underwent a total thyroidectomy 04/24/21.  She had an MBS while admitted to [facility] which revealed severe dysphagia. She currently takes only ice and small sips of water by mouth; otherwise all nutrition via G-tube.      SLP Plan  All goals met      Recommendations for follow up therapy are one component of a multi-disciplinary discharge planning process, led by the attending physician.  Recommendations may be updated based on patient status, additional functional criteria and insurance authorization.    Recommendations  Diet recommendations: Thin liquid Liquids provided via: Straw Medication Administration: Via alternative means Supervision: Patient able to self feed                  Oral care QID     Dysphagia, oropharyngeal phase (R13.12)     All goals met    Micheal Sheen L. Samson Frederic, MA CCC/SLP Clinical Specialist - Acute Care SLP Acute Rehabilitation Services Office number [phone]  [name]  01/26/2023, 12:21 PM

## 2023-01-26 NOTE — TOC Progression Note (Addendum)
Transition of Care [facility]) - Progression Note    Patient Details  Name: Cynthia Underwood MRN: [id number] Date of Birth: 1971/05/24  [facility] CM/SW Contact  Mearl Latin, LCSW Phone Number: 01/26/2023, 10:14 AM  Clinical Narrative:    CSW received call from patient's sister. They have selected [facility]. CSW made facility aware and will start insurance process, Ref# [id number].   Expected Discharge Plan: Skilled Nursing Facility Barriers to Discharge: Continued Medical Work up, English as a second language teacher, SNF Pending bed offer  Expected Discharge Plan and Services In-house Referral: Clinical Social Work   Post Acute Care Choice: Home Health, Skilled Nursing Facility Living arrangements for the past 2 months: Single Family Home                                       Social Determinants of Health (SDOH) Interventions SDOH Screenings   Food Insecurity: No Food Insecurity (01/23/2023)  Housing: Low Risk  (01/23/2023)  Transportation Needs: No Transportation Needs (01/23/2023)  Utilities: Not At Risk (01/23/2023)  Alcohol Screen: Low Risk  (10/16/2022)  Depression (PHQ2-9): Medium Risk (11/08/2022)  Financial Resource Strain: Low Risk  (10/16/2022)  Physical Activity: Inactive (10/16/2022)  Social Connections: Socially Isolated (10/16/2022)  Stress: Stress Concern Present (10/16/2022)  Tobacco Use: High Risk (01/23/2023)    Readmission Risk Interventions     No data to display

## 2023-01-26 NOTE — Progress Notes (Signed)
Physical Therapy Treatment Patient Details Name: Cynthia Underwood MRN: [id number] DOB: 1970/12/24 Today's Date: 01/26/2023   History of Present Illness 52 yo female with onset of fever and diarrhea was admitted on 5/20 and noted SIRS, pleural effusion, bronchiectasis, bibasilar infiltrates.  Sepsis and SIRS from CAP. Has suspected c-diff.  PMHx: malnutrition, hypothyroidism, anxiety, myotonic dystrophy, dysphagia, O2 dependence, GERD, CHF with EF 20%, pacer, heart block, chronic respiratory failure, hypoxia, trach 11/2022    PT Comments    Pt resting in bed and agreeable to session with good progress towards acute goals. Pt requiring grossly min guard for bed mobility, transfers and gait with RW for support. Pt needing intermittent min A to manage RW as pt drifting L in hall and running into obstacles, needing assist and cues to correct. Pt continues to be limited by decreased activity tolerance, impaired balance/postural reactions and weakness. VSS on supplemental O2 throughout mobility. Pt continues to benefit from skilled PT services to progress toward functional mobility goals.    Recommendations for follow up therapy are one component of a multi-disciplinary discharge planning process, led by the attending physician.  Recommendations may be updated based on patient status, additional functional criteria and insurance authorization.  Follow Up Recommendations       Assistance Recommended at Discharge Frequent or constant Supervision/Assistance  Patient can return home with the following A little help with walking and/or transfers;A little help with bathing/dressing/bathroom;Assistance with cooking/housework;Assist for transportation;Help with stairs or ramp for entrance   Equipment Recommendations  None recommended by PT    Recommendations for Other Services       Precautions / Restrictions Precautions Precautions: Fall Precaution Comments: watch sats with  mobility Restrictions Weight Bearing Restrictions: No     Mobility  Bed Mobility Overal bed mobility: Needs Assistance Bed Mobility: Sit to Supine, Supine to Sit     Supine to sit: Min guard Sit to supine: Min assist   General bed mobility comments: min A to bring LE back into bed at end of session    Transfers Overall transfer level: Needs assistance Equipment used: Rolling walker (2 wheels), None Transfers: Sit to/from Stand, Bed to chair/wheelchair/BSC Sit to Stand: Min guard           General transfer comment: min guard from EOB to RW and from low commode without AD    Ambulation/Gait Ambulation/Gait assistance: Min guard, Min assist Gait Distance (Feet): 350 Feet (+15' without AD from bathrom>EOB) Assistive device: Rolling walker (2 wheels), None Gait Pattern/deviations: Decreased stride length, Narrow base of support, Step-through pattern Gait velocity: reduced     General Gait Details: min guard for safety, pt needing increased cues for attention to obstacles on the L as pt drifting L and needing assist to come back to center, no overt LOB   Stairs             Wheelchair Mobility    Modified Rankin (Stroke Patients Only)       Balance Overall balance assessment: Needs assistance Sitting-balance support: Feet supported Sitting balance-Leahy Scale: Fair     Standing balance support: Single extremity supported Standing balance-Leahy Scale: Fair Standing balance comment: requires minor support to balance when stepping, lines and O2 are an issue                            Cognition Arousal/Alertness: Awake/alert Behavior During Therapy: WFL for tasks assessed/performed, Anxious Overall Cognitive Status: Within Functional Limits for tasks  assessed                                          Exercises      General Comments General comments (skin integrity, edema, etc.): VSS on supplemental O2      Pertinent  Vitals/Pain Pain Assessment Pain Assessment: No/denies pain    Home Living                          Prior Function            PT Goals (current goals can now be found in the care plan section) Acute Rehab PT Goals PT Goal Formulation: With patient Time For Goal Achievement: 02/07/23 Progress towards PT goals: Progressing toward goals    Frequency    Min 3X/week      PT Plan      Co-evaluation              AM-PAC PT "6 Clicks" Mobility   Outcome Measure  Help needed turning from your back to your side while in a flat bed without using bedrails?: A Little Help needed moving from lying on your back to sitting on the side of a flat bed without using bedrails?: A Little Help needed moving to and from a bed to a chair (including a wheelchair)?: A Little Help needed standing up from a chair using your arms (e.g., wheelchair or bedside chair)?: A Little Help needed to walk in hospital room?: A Little Help needed climbing 3-5 steps with a railing? : Total 6 Click Score: 16    End of Session Equipment Utilized During Treatment: Oxygen Activity Tolerance: Patient tolerated treatment well Patient left: in bed;with call bell/phone within reach;with bed alarm set;with family/visitor present Nurse Communication: Mobility status PT Visit Diagnosis: Unsteadiness on feet (R26.81);Muscle weakness (generalized) (M62.81);Difficulty in walking, not elsewhere classified (R26.2)     Time: 1610-9604 PT Time Calculation (min) (ACUTE ONLY): 23 min  Charges:  $Gait Training: 8-22 mins $Therapeutic Activity: 8-22 mins                     Fermon Ureta R. PTA Acute Rehabilitation Services Office: [phone]   Catalina Antigua 01/26/2023, 2:50 PM

## 2023-01-27 ENCOUNTER — Inpatient Hospital Stay (HOSPITAL_COMMUNITY): Payer: 59

## 2023-01-27 DIAGNOSIS — R6510 Systemic inflammatory response syndrome (SIRS) of non-infectious origin without acute organ dysfunction: Secondary | ICD-10-CM | POA: Diagnosis not present

## 2023-01-27 LAB — GLUCOSE, CAPILLARY
Glucose-Capillary: 90 mg/dL (ref 70–99)
Glucose-Capillary: 144 mg/dL — ABNORMAL HIGH (ref 70–99)
Glucose-Capillary: 117 mg/dL — ABNORMAL HIGH (ref 70–99)
Glucose-Capillary: 99 mg/dL (ref 70–99)

## 2023-01-27 LAB — CBC WITH DIFFERENTIAL/PLATELET
Abs Immature Granulocytes: 0.02 10*3/uL (ref 0.00–0.07)
Basophils Absolute: 0 10*3/uL (ref 0.0–0.1)
Basophils Relative: 1 %
Eosinophils Absolute: 0.2 10*3/uL (ref 0.0–0.5)
Eosinophils Relative: 3 %
HCT: 34.6 % — ABNORMAL LOW (ref 36.0–46.0)
Hemoglobin: 10.8 g/dL — ABNORMAL LOW (ref 12.0–15.0)
Immature Granulocytes: 0 %
Lymphocytes Relative: 40 %
Lymphs Abs: 2.4 10*3/uL (ref 0.7–4.0)
MCH: 30.9 pg (ref 26.0–34.0)
MCHC: 31.2 g/dL (ref 30.0–36.0)
MCV: 98.9 fL (ref 80.0–100.0)
Monocytes Absolute: 0.4 10*3/uL (ref 0.1–1.0)
Monocytes Relative: 7 %
Neutro Abs: 2.9 10*3/uL (ref 1.7–7.7)
Neutrophils Relative %: 49 %
Platelets: 243 10*3/uL (ref 150–400)
RBC: 3.5 MIL/uL — ABNORMAL LOW (ref 3.87–5.11)
RDW: 15.8 % — ABNORMAL HIGH (ref 11.5–15.5)
WBC: 5.9 10*3/uL (ref 4.0–10.5)
nRBC: 0 % (ref 0.0–0.2)

## 2023-01-27 LAB — PROCALCITONIN: Procalcitonin: <0.1 ng/mL

## 2023-01-27 LAB — CULTURE, BLOOD (ROUTINE X 2): Culture: NO GROWTH

## 2023-01-27 LAB — BASIC METABOLIC PANEL
Anion gap: 7 (ref 5–15)
BUN: 11 mg/dL (ref 6–20)
CO2: 32 mmol/L (ref 22–32)
Calcium: 9.1 mg/dL (ref 8.9–10.3)
Chloride: 103 mmol/L (ref 98–111)
Creatinine, Ser: 0.44 mg/dL (ref 0.44–1.00)
GFR, Estimated: >60 mL/min (ref >60)
Glucose, Bld: 103 mg/dL — ABNORMAL HIGH (ref 70–99)
Potassium: 3.8 mmol/L (ref 3.5–5.1)
Sodium: 142 mmol/L (ref 135–145)

## 2023-01-27 LAB — MAGNESIUM: Magnesium: 2 mg/dL (ref 1.7–2.4)

## 2023-01-27 LAB — C-REACTIVE PROTEIN: CRP: 1.2 mg/dL — ABNORMAL HIGH (ref <1.0)

## 2023-01-27 LAB — BRAIN NATRIURETIC PEPTIDE: B Natriuretic Peptide: 39.2 pg/mL (ref 0.0–100.0)

## 2023-01-27 MED ORDER — POTASSIUM CHLORIDE 20 MEQ PO PACK
40.0000 meq | PACK | Freq: Once | ORAL | Status: AC
  Administered 2023-01-27: 40 meq
  Filled 2023-01-27: qty 2

## 2023-01-27 MED ORDER — FUROSEMIDE 10 MG/ML IJ SOLN
20.0000 mg | Freq: Once | INTRAMUSCULAR | Status: AC
  Administered 2023-01-27: 20 mg via INTRAVENOUS
  Filled 2023-01-27: qty 2

## 2023-01-27 NOTE — Plan of Care (Signed)

## 2023-01-27 NOTE — Progress Notes (Signed)
PROGRESS NOTE                                                                                                                                                                                                             Patient Demographics:    Cynthia Underwood, is a 52 y.o. female, DOB - 1971-06-08, ION:[id number]  Outpatient Primary MD for the patient is Shayne Alken, MD    LOS - 4  Admit date - 01/22/2023    Chief Complaint  Patient presents with   Weakness   Diarrhea       Brief Narrative (HPI from H&P)    52 y.o. female with medical history significant of myotonic dystrophy, hypothyroidism, anxiety, heart block with pacer, HFrEF, and admission in 11/2022 for pneumonia with hypoxic respiratory failure failure that was complicated by VF arrest, was recently hospitalized to [facility], she was discharged from the [facility] on 01/19/2023.  She now presents with ongoing cough, diarrhea and low-grade fevers.  Diagnosed with aspiration pneumonia and admitted to the hospital, patient claims that prior to her discharge she had few episodes of nausea and vomiting at the [facility] facility.   Subjective:   Patient in bed, appears comfortable, denies any headache, no fever, no chest pain or pressure, no shortness of breath , no abdominal pain. No new focal weakness.Improved diarrhea.   Assessment  & Plan :   Aspiration pneumonia.  No sepsis.  Had some emesis prior to her discharge from the LTAC facility and likely aspirated due to that, much improved on empiric antibiotic, already n.p.o. with tube feeds via PEG tube which will be continued.  Speech to evaluate, encouraged to sit in chair use I-S and flutter valve.  Follow cultures.  Chronic hypoxic respiratory failure.  On baseline 2 L nasal cannula.    Diarrhea.  Due to tube feeds, Imodium added, GI pathogen panel stable, tube  feed rate reduced 01/26/23 >> much better 01/27/23.  History of chronic systolic heart failure EF 20% due to nonischemic cardiomyopathy. History of V-fib arrest - March 2024.  Follows with Dr. Marca Ancona.  Currently compensated.  She is on digoxin at baseline which will be continued.  V-fib arrest was thought to be due to reversible causes by the cardiology team hence defibrillator was not pursued.  High-grade heart block.  Has pacemaker.    History of myotonic dystrophy.  Generalized weakness, dysphagia, previous history of trach now decannulated with healed stoma.    Dysphagia -  with PEG tube feed.  Anxiety.  On Seroquel.  Hypothyroidism.  Continue Synthroid.  Stable TSH.  GERD.  On PPI.  Possible artifact versus foreign body in the GI tract noted on CT scan in the ER.  Discussed with GI Dr. Adela Lank subsequent x-ray stable.  No further workup.        Condition - Extremely Guarded  Family Communication  :  message left for [name] [phone], 01/24/2023 at 9:20 AM  Code Status :  Full  Consults  :  None  PUD Prophylaxis : PPI   Procedures  :     CT - 1. No acute intra-abdominal pathology identified. No definite radiographic explanation for the patient's reported symptoms. 2. Pull-through type gastrostomy catheter in appropriate position. 3. 2 cm linear metallic density intraluminally within the bowel within the right hemipelvis. This may represent an ingested foreign body. No surrounding inflammatory stranding or loculated fluid collection to suggest perforation. 4. Persistent dense collapse and consolidation of the lower lobes bilaterally with marked volume loss. Again noted is bronchiectasis and multifocal airway impaction within the collapsed segments. While this appears stable since prior examination, this is new since remote prior examination of 11/11/2022.      Disposition Plan  :    Status is: Inpatient  DVT Prophylaxis  :    enoxaparin (LOVENOX) injection 40  mg Start: 01/23/23 0915    Lab Results  Component Value Date   PLT 243 01/27/2023    Diet :  Diet Order             Diet clear liquid Room service appropriate? Yes; Fluid consistency: Thin  Diet effective now                    Inpatient Medications  Scheduled Meds:  digoxin  0.25 mg Per Tube Daily   doxycycline  100 mg Oral Q12H   enoxaparin (LOVENOX) injection  40 mg Subcutaneous Q24H   feeding supplement (PROSource TF20)  60 mL Per Tube Daily   free water  170 mL Per Tube Q6H   furosemide  20 mg Intravenous Once   guaiFENesin  400 mg Oral TID   levothyroxine  112 mcg Per Tube Q0600   metoCLOPramide  5 mg Per Tube BID WC   metoprolol tartrate  12.5 mg Per Tube BID   pantoprazole  40 mg Oral Daily   potassium chloride  40 mEq Per Tube Once   QUEtiapine  25 mg Per Tube QHS   saccharomyces boulardii  250 mg Per Tube BID   sodium chloride flush  3 mL Intravenous Q12H   spironolactone  12.5 mg Per Tube Daily   Continuous Infusions:  cefTRIAXone (ROCEPHIN)  IV 2 g (01/26/23 2143)   feeding supplement (JEVITY 1.5 CAL/FIBER) 1,000 mL (01/26/23 1243)   PRN Meds:.acetaminophen **OR** acetaminophen, albuterol, loperamide    Objective:   Vitals:   01/26/23 1610 01/26/23 1944 01/26/23 2000 01/27/23 0455  BP: 97/65 (!) 87/74 (!) 89/69 104/73  Pulse:  76    Resp: 16 15 18 18   Temp:  97.6 F (36.4 C)  97.6 F (36.4 C)  TempSrc: Tympanic Oral  Oral  SpO2:  100%    Weight:        Wt Readings from Last 3 Encounters:  01/26/23 45.4  kg  12/06/22 39.9 kg  11/08/22 44.7 kg     Intake/Output Summary (Last 24 hours) at 01/27/2023 4098 Last data filed at 01/27/2023 0600 Gross per 24 hour  Intake 2710.06 ml  Output 0 ml  Net 2710.06 ml      Physical Exam  Weak and frail middle-aged African-American female, awake Alert, No new F.N deficits, trach site stoma has healed, PEG tube in place [place].AT,PERRAL Supple Neck, No JVD,   Symmetrical Chest wall movement, Mod  air movement bilaterally, CTAB RRR,No Gallops,Rubs or new Murmurs,  +ve B.Sounds, Abd Soft, No tenderness,   No Cyanosis, Clubbing or edema        Data Review:    Recent Labs  Lab 01/22/23 2311 01/24/23 0415 01/25/23 0402 01/26/23 0408 01/27/23 0237  WBC 20.0* 7.9 5.2 5.2 5.9  HGB 12.1 9.7* 10.6* 11.2* 10.8*  HCT 38.6 30.7* 34.3* 35.5* 34.6*  PLT 255 206 231 239 243  MCV 99.2 97.5 98.3 98.3 98.9  MCH 31.1 30.8 30.4 31.0 30.9  MCHC 31.3 31.6 30.9 31.5 31.2  RDW 16.0* 16.1* 16.2* 15.9* 15.8*  LYMPHSABS  --   --  2.0 2.2 2.4  MONOABS  --   --  0.4 0.4 0.4  EOSABS  --   --  0.1 0.2 0.2  BASOSABS  --   --  0.0 0.0 0.0    Recent Labs  Lab 01/22/23 2311 01/24/23 0415 01/24/23 0645 01/25/23 0402 01/26/23 0408 01/27/23 0237  NA 136 138  --  141 141 142  K 4.0 3.4*  --  3.8 3.6 3.8  CL 100 103  --  105 103 103  CO2 25 28  --  29 30 32  ANIONGAP 11 7  --  7 8 7   GLUCOSE 120* 94  --  98 86 103*  BUN 12 10  --  12 10 11   CREATININE 0.55 0.48  --  0.45 0.45 0.44  AST 24  --   --   --   --   --   ALT 39  --   --   --   --   --   ALKPHOS 71  --   --   --   --   --   BILITOT 0.4  --   --   --   --   --   ALBUMIN 2.6*  --   --   --   --   --   CRP  --   --  11.1* 4.2* 1.9* 1.2*  PROCALCITON 0.11  --  <0.10 <0.10 <0.10 <0.10  LATICACIDVEN 1.3  --   --   --   --   --   BNP  --   --  54.9 40.1 29.4 39.2  MG  --   --  2.3 2.1 2.1 2.0  CALCIUM 8.9 8.6*  --  8.8* 9.1 9.1      Recent Labs  Lab 01/22/23 2311 01/24/23 0415 01/24/23 0645 01/25/23 0402 01/26/23 0408 01/27/23 0237  CRP  --   --  11.1* 4.2* 1.9* 1.2*  PROCALCITON 0.11  --  <0.10 <0.10 <0.10 <0.10  LATICACIDVEN 1.3  --   --   --   --   --   BNP  --   --  54.9 40.1 29.4 39.2  MG  --   --  2.3 2.1 2.1 2.0  CALCIUM 8.9 8.6*  --  8.8* 9.1 9.1     Lab Results  Component  Value Date   HGBA1C 5.6 11/15/2022    Radiology Reports DG Swallowing Func-Speech Pathology  Result Date: 01/25/2023 Table formatting  from the original result was not included. Modified Barium Swallow Study Patient Details Name: Cynthia Underwood MRN: [id number] Date of Birth: 11/30/1970 Today's Date: 01/25/2023 HPI/PMH: HPI: Patient is a 52 y.o. female with PMH: myotonic dystrophy, chronic dysphagia, hypothyroidism, total thyroidectomy 2022, anxiety, heart block with pacer, HFrEF. She was recently admitted in March 2024 for PNA and hypoxic respiratory failure complicated by VF arrest, requiring intubation then trach placement. She was [facility] from [facility] where she had PEG placed due to severe dysphagia, was decannulated and ultimately dischaged home on 01/19/23. She presented to the hospital for current admission, on 01/23/23 due to ongoing cough, diarrhea and low grade fevers. She has long-standing hx of dysphagia that was first identified when she was living in [place]. 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  She had worsening of dysphagia and in 10/2020 was found to have a mulitnodular thyroid goiter. CT cited mild to moderate mass effect upon the esophagus.  Esophagram 10/21/20: "Marked difficulty in initiating swallowing with delayed oropharyngeal phase and large amount of residue in the piriform sinuses and vallecula. No frank aspiration seen. Mild narrowing in the lower cervical esophagus, likely related to thyroid goiter."  She underwent a total thyroidectomy 04/24/21.  She had an MBS while admitted to [place] which revealed severe dysphagia. She currently takes only ice and small sips of water by mouth; otherwise all nutrition via G-tube. Clinical Impression: Clinical Impression: Pt presents with a severe dysphagia that is primarily related to her dx of myotonic dystrophy type 1 with potential secondary effects from her 2022 total thyroidectomy. The swallow is impacted by nearly absent pharyngeal stripping wave and minimal base-of-tongue retraction.  There is reduced laryngeal elevation.  These factors lead to  minimal movement of POs through the pharynx.  She was able to protect her airway surprisingly well, with only high/transient penetration of thin liquids into the laryngeal vestibule, no aspiration, but repetitive effort and subswallows necessary in order to transfer small portions of a single bolus through her UES.  There was almost total stasis of purees in pharynx, requiring oral suctioning and great effort to expectorate/remove barium.  Continue NPO andTF as primary source of nutrition; allow small sips of water/ice chips per pt's preferences. With therapy, she may be able to drink larger volumes of thin liquid in the future.  SLP will follow while admiited; she will need intensive therapy  after discharge. Factors that may increase risk of adverse event in presence of aspiration Rubye Oaks & Clearance Coots 2021): Factors that may increase risk of adverse event in presence of aspiration Rubye Oaks & Clearance Coots 2021): Frail or deconditioned; Weak cough; Limited mobility Recommendations/Plan: Swallowing Evaluation Recommendations Swallowing Evaluation Recommendations Recommendations: NPO; Ice chips PRN after oral care Medication Administration: Via alternative means Supervision: Patient able to self-feed Oral care recommendations: Oral care QID (4x/day); Oral care before ice chips/water Treatment Plan Treatment Plan Treatment recommendations: Therapy as outlined in treatment plan below Follow-up recommendations: Other (comment) (tba) Treatment frequency: Min 2x/week Treatment duration: 2 weeks Recommendations Recommendations for follow up therapy are one component of a multi-disciplinary discharge planning process, led by the attending physician.  Recommendations may be updated based on patient status, additional functional criteria and insurance authorization. Assessment: Orofacial Exam: Orofacial Exam Oral Cavity: Oral Hygiene: WFL Oral Cavity - Dentition: Adequate natural dentition Oral Motor/Sensory  Function: Suspected cranial  nerve impairment (bilateral buccinator weakness per neurology assessment as OP) Anatomy: Anatomy: Other (Comment) (surgical clips from thyroidectomy visible) Boluses Administered: Boluses Administered Boluses Administered: Thin liquids (Level 0); Puree  Oral Impairment Domain: Oral Impairment Domain Lip Closure: No labial escape Tongue control during bolus hold: Escape to lateral buccal cavity/floor of mouth Oral residue: Trace residue lining oral structures Location of oral residue : Tongue Initiation of pharyngeal swallow : Posterior laryngeal surface of the epiglottis  Pharyngeal Impairment Domain: Pharyngeal Impairment Domain Soft palate elevation: No bolus between soft palate (SP)/pharyngeal wall (PW) Laryngeal elevation: Minimal superior movement of thyroid cartilage with minimal approximation of arytenoids to epiglottic petiole Anterior hyoid excursion: Partial anterior movement Epiglottic movement: Partial inversion Laryngeal vestibule closure: Incomplete, narrow column air/contrast in laryngeal vestibule Pharyngeal stripping wave : Absent Pharyngeal contraction (A/P view only): N/A Pharyngoesophageal segment opening: Minimal distention/minimal duration, marked obstruction of flow Tongue base retraction: Wide column of contrast or air between tongue base and PPW Pharyngeal residue: Majority of contrast within or on pharyngeal structures Location of pharyngeal residue: Valleculae; Pyriform sinuses  Esophageal Impairment Domain: Esophageal Impairment Domain Esophageal clearance upright position: -- (not viewed) Pill: Esophageal Impairment Domain Esophageal clearance upright position: -- (not viewed) Penetration/Aspiration Scale Score: Penetration/Aspiration Scale Score 2.  Material enters airway, remains ABOVE vocal cords then ejected out: Thin liquids (Level 0) Compensatory Strategies: Compensatory Strategies Compensatory strategies: Yes Effortful swallow: Ineffective Chin tuck: Ineffective Left head turn:  Ineffective Ineffective Left Head Turn: Thin liquid (Level 0) Right head turn: Ineffective Ineffective Right Head Turn: Thin liquid (Level 0)   General Information: Caregiver present: No  Diet Prior to this Study: NPO; G-tube   Temperature : Normal   No data recorded  Supplemental O2: Nasal cannula   History of Recent Intubation: No  Behavior/Cognition: Alert; Cooperative No data recorded Baseline vocal quality/speech: Dysphonic Volitional Cough: Able to elicit Volitional Swallow: Able to elicit No data recorded Goal Planning: Prognosis for improved oropharyngeal function: Good No data recorded No data recorded Patient/Family Stated Goal: frustrated by only being able to have ice chips No data recorded Pain: Pain Assessment Pain Assessment: Faces Faces Pain Scale: 0 Breathing: 0 Negative Vocalization: 0 Facial Expression: 1 Body Language: 1 Consolability: 1 PAINAD Score: 3 Facial Expression: 0 Body Movements: 0 Muscle Tension: 0 Compliance with ventilator (intubated pts.): 0 Vocalization (extubated pts.): N/A CPOT Total: 0 Pain Location: chest wall, due to coughing Pain Descriptors / Indicators: Grimacing; Discomfort Pain Intervention(s): Limited activity within patient's tolerance; Monitored during session; Repositioned End of Session: Start Time:SLP Start Time (ACUTE ONLY): 1353 Stop Time: SLP Stop Time (ACUTE ONLY): 1425 Time Calculation:SLP Time Calculation (min) (ACUTE ONLY): 32 min Charges: SLP Evaluations $ SLP Speech Visit: 1 Visit SLP Evaluations $BSS Swallow: 1 Procedure $MBS Swallow: 1 Procedure $ SLP Eval Voice Prosthetic Device: Procedure $$ Passy Muir Speaking Valve: yes $Speech Treatment for Individual: 1 Procedure SLP visit diagnosis: SLP Visit Diagnosis: Dysphagia, oropharyngeal phase (R13.12) Past Medical History: Past Medical History: Diagnosis Date  Bifascicular block 10/02/2014  Cataract   Dyspnea   Excess ear wax   Multinodular goiter   Pneumonia   Presence of permanent cardiac pacemaker   RBBB  (right 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) 10/02/2014  Sickle cell trait (HCC)   Watery eyes   Left  Weakness of both legs  Past Surgical History: Past Surgical History: Procedure Laterality Date  BREAST BIOPSY Right   EYE SURGERY Left 2017  "related to  blockage in my nose"  EYE SURGERY Right 05/2019  INSERT / REPLACE / REMOVE PACEMAKER  02/15/2017  IR GASTROSTOMY TUBE MOD SED  01/01/2023  PACEMAKER IMPLANT N/A 02/15/2017  Procedure: Pacemaker Implant;  Surgeon: Duke Salvia, MD;  Location[facility] INVASIVE CV LAB;  Service: Cardiovascular;  Laterality: N/A;  RIGHT/LEFT HEART CATH AND CORONARY ANGIOGRAPHY N/A 11/16/2022  Procedure: RIGHT/LEFT HEART CATH AND CORONARY ANGIOGRAPHY;  Surgeon: Laurey Morale, MD;  Location: [facility] INVASIVE CV LAB;  Service: Cardiovascular;  Laterality: N/A;  THYROIDECTOMY N/A 04/21/2021  Procedure: TOTAL THYROIDECTOMY;  Surgeon: Axel Filler, MD;  Location: [facility] OR;  Service: General;  Laterality: N/A; Carolan Shiver 01/25/2023, 3:40 PM Amanda L. Samson Frederic, MA CCC/SLP Clinical Specialist - Acute Care SLP Acute Rehabilitation Services Office number [phone]  DG Abd 1 View  Result Date: 01/24/2023 CLINICAL DATA:  Foreign body. EXAM: ABDOMEN - 1 VIEW COMPARISON:  Jan 05, 2023. FINDINGS: The bowel gas pattern is normal. Gastrostomy tube is seen well positioned over gastric air shadow. No radio-opaque calculi or other significant radiographic abnormality are seen. IMPRESSION: Gastrostomy tube is noted.  No abnormal bowel dilatation. Electronically Signed   By: Lupita Raider M.D.   On: 01/24/2023 13:05   DG Chest Port 1 View  Result Date: 01/24/2023 CLINICAL DATA:  Shortness of breath. EXAM: PORTABLE CHEST 1 VIEW COMPARISON:  01/22/2023 FINDINGS: Persistent right pleural effusion and bibasilar infiltrates. Stable pacer wires.  Stable surgical changes in the neck. IMPRESSION: Persistent right pleural effusion and bibasilar infiltrates. Electronically Signed    By: Rudie Meyer M.D.   On: 01/24/2023 07:08      Signature  -   Susa Raring M.D on 01/27/2023 at 8:07 AM   -  To page go to [email or web address]

## 2023-01-28 DIAGNOSIS — R6510 Systemic inflammatory response syndrome (SIRS) of non-infectious origin without acute organ dysfunction: Secondary | ICD-10-CM | POA: Diagnosis not present

## 2023-01-28 LAB — GLUCOSE, CAPILLARY
Glucose-Capillary: 100 mg/dL — ABNORMAL HIGH (ref 70–99)
Glucose-Capillary: 104 mg/dL — ABNORMAL HIGH (ref 70–99)
Glucose-Capillary: 105 mg/dL — ABNORMAL HIGH (ref 70–99)
Glucose-Capillary: 107 mg/dL — ABNORMAL HIGH (ref 70–99)
Glucose-Capillary: 108 mg/dL — ABNORMAL HIGH (ref 70–99)
Glucose-Capillary: 72 mg/dL (ref 70–99)
Glucose-Capillary: 84 mg/dL (ref 70–99)

## 2023-01-28 LAB — CULTURE, BLOOD (ROUTINE X 2): Culture: NO GROWTH

## 2023-01-28 NOTE — Progress Notes (Signed)
PROGRESS NOTE                                                                                                                                                                                                             Patient Demographics:    Cynthia Underwood, is a 51 y.o. female, DOB - [birth year], UEA:[id number]  Outpatient Primary MD for the patient is Shayne Alken, MD    LOS - 5  Admit date - 01/22/2023    Chief Complaint  Patient presents with   Weakness   Diarrhea       Brief Narrative (HPI from H&P)    52 y.o. female with medical history significant of myotonic dystrophy, hypothyroidism, anxiety, heart block with pacer, HFrEF, and admission in 11/2022 for pneumonia with hypoxic respiratory failure failure that was complicated by VF arrest, was recently hospitalized to [facility], she was discharged from the [facility] on 01/19/2023.  She now presents with ongoing cough, diarrhea and low-grade fevers.  Diagnosed with aspiration pneumonia and admitted to the hospital, patient claims that prior to her discharge she had few episodes of nausea and vomiting at the [facility] facility.   Subjective:   Patient in bed, appears comfortable, denies any headache, no fever, no chest pain or pressure, no shortness of breath , no abdominal pain. No focal weakness.   Assessment  & Plan :   Aspiration pneumonia.  No sepsis.  Had some emesis prior to her discharge from the LTAC facility and likely aspirated due to that, much improved on empiric antibiotic, already n.p.o. with tube feeds via PEG tube which will be continued.  Speech to evaluate, encouraged to sit in chair use I-S and flutter valve.  Follow cultures.  Clinically much improved likely discharge to SNF in the next day or 2.  Chronic hypoxic respiratory failure.  On baseline 2 L nasal cannula.    Diarrhea.  Due to tube  feeds, Imodium added, GI pathogen panel stable, tube feed rate reduced 01/26/23 >> much better 01/27/23.  History of chronic systolic heart failure EF 20% due to nonischemic cardiomyopathy. History of V-fib arrest - March 2024.  Follows with Dr. Marca Ancona.  Currently compensated.  She is on digoxin at baseline which will be continued.  V-fib arrest was thought to be due to  reversible causes by the cardiology team hence defibrillator was not pursued.  High-grade heart block.  Has pacemaker.    History of myotonic dystrophy.  Generalized weakness, dysphagia, previous history of trach now decannulated with healed stoma.    Dysphagia -  with PEG tube feed.  Anxiety.  On Seroquel.  Hypothyroidism.  Continue Synthroid.  Stable TSH.  GERD.  On PPI.  Possible artifact versus foreign body in the GI tract noted on CT scan in the ER.  Discussed with GI Dr. Adela Lank subsequent x-ray stable.  No further workup.        Condition - Extremely Guarded  Family Communication  :  message left for [name] [phone], 01/24/2023 at 9:20 AM  Code Status :  Full  Consults  :  None  PUD Prophylaxis : PPI   Procedures  :     CT - 1. No acute intra-abdominal pathology identified. No definite radiographic explanation for the patient's reported symptoms. 2. Pull-through type gastrostomy catheter in appropriate position. 3. 2 cm linear metallic density intraluminally within the bowel within the right hemipelvis. This may represent an ingested foreign body. No surrounding inflammatory stranding or loculated fluid collection to suggest perforation. 4. Persistent dense collapse and consolidation of the lower lobes bilaterally with marked volume loss. Again noted is bronchiectasis and multifocal airway impaction within the collapsed segments. While this appears stable since prior examination, this is new since remote prior examination of 11/11/2022.      Disposition Plan  :    Status is: Inpatient  DVT  Prophylaxis  :    enoxaparin (LOVENOX) injection 40 mg Start: 01/23/23 0915    Lab Results  Component Value Date   PLT 243 01/27/2023    Diet :  Diet Order             Diet clear liquid Room service appropriate? Yes; Fluid consistency: Thin  Diet effective now                    Inpatient Medications  Scheduled Meds:  digoxin  0.25 mg Per Tube Daily   doxycycline  100 mg Oral Q12H   enoxaparin (LOVENOX) injection  40 mg Subcutaneous Q24H   feeding supplement (PROSource TF20)  60 mL Per Tube Daily   free water  170 mL Per Tube Q6H   guaiFENesin  400 mg Oral TID   levothyroxine  112 mcg Per Tube Q0600   metoCLOPramide  5 mg Per Tube BID WC   metoprolol tartrate  12.5 mg Per Tube BID   pantoprazole  40 mg Oral Daily   QUEtiapine  25 mg Per Tube QHS   saccharomyces boulardii  250 mg Per Tube BID   sodium chloride flush  3 mL Intravenous Q12H   spironolactone  12.5 mg Per Tube Daily   Continuous Infusions:  cefTRIAXone (ROCEPHIN)  IV 2 g (01/28/23 0014)   feeding supplement (JEVITY 1.5 CAL/FIBER) 1,000 mL (01/26/23 1243)   PRN Meds:.acetaminophen **OR** acetaminophen, albuterol, loperamide    Objective:   Vitals:   01/27/23 1542 01/27/23 1600 01/27/23 2014 01/28/23 0311  BP: 100/71 100/71 96/64 93/72   Pulse: 78 78  83  Resp: 19 16 16 15   Temp: 98.2 F (36.8 C) 98.2 F (36.8 C) 97.9 F (36.6 C) 97.7 F (36.5 C)  TempSrc: Oral Oral Temporal Temporal  SpO2: 100% 100% 100% 100%  Weight:        Wt Readings from Last 3 Encounters:  01/26/23 45.4 kg  12/06/22 39.9 kg  11/08/22 44.7 kg    No intake or output data in the 24 hours ending 01/28/23 0820     Physical Exam  Weak and frail middle-aged African-American female, awake Alert, No new F.N deficits, trach site stoma has healed, PEG tube in place [place].AT,PERRAL Supple Neck, No JVD,   Symmetrical Chest wall movement, Mod air movement bilaterally, CTAB RRR,No Gallops,Rubs or new Murmurs,  +ve  B.Sounds, Abd Soft, No tenderness,   No Cyanosis, Clubbing or edema        Data Review:    Recent Labs  Lab 01/22/23 2311 01/24/23 0415 01/25/23 0402 01/26/23 0408 01/27/23 0237  WBC 20.0* 7.9 5.2 5.2 5.9  HGB 12.1 9.7* 10.6* 11.2* 10.8*  HCT 38.6 30.7* 34.3* 35.5* 34.6*  PLT 255 206 231 239 243  MCV 99.2 97.5 98.3 98.3 98.9  MCH 31.1 30.8 30.4 31.0 30.9  MCHC 31.3 31.6 30.9 31.5 31.2  RDW 16.0* 16.1* 16.2* 15.9* 15.8*  LYMPHSABS  --   --  2.0 2.2 2.4  MONOABS  --   --  0.4 0.4 0.4  EOSABS  --   --  0.1 0.2 0.2  BASOSABS  --   --  0.0 0.0 0.0    Recent Labs  Lab 01/22/23 2311 01/24/23 0415 01/24/23 0645 01/25/23 0402 01/26/23 0408 01/27/23 0237  NA 136 138  --  141 141 142  K 4.0 3.4*  --  3.8 3.6 3.8  CL 100 103  --  105 103 103  CO2 25 28  --  29 30 32  ANIONGAP 11 7  --  7 8 7   GLUCOSE 120* 94  --  98 86 103*  BUN 12 10  --  12 10 11   CREATININE 0.55 0.48  --  0.45 0.45 0.44  AST 24  --   --   --   --   --   ALT 39  --   --   --   --   --   ALKPHOS 71  --   --   --   --   --   BILITOT 0.4  --   --   --   --   --   ALBUMIN 2.6*  --   --   --   --   --   CRP  --   --  11.1* 4.2* 1.9* 1.2*  PROCALCITON 0.11  --  <0.10 <0.10 <0.10 <0.10  LATICACIDVEN 1.3  --   --   --   --   --   BNP  --   --  54.9 40.1 29.4 39.2  MG  --   --  2.3 2.1 2.1 2.0  CALCIUM 8.9 8.6*  --  8.8* 9.1 9.1      Recent Labs  Lab 01/22/23 2311 01/24/23 0415 01/24/23 0645 01/25/23 0402 01/26/23 0408 01/27/23 0237  CRP  --   --  11.1* 4.2* 1.9* 1.2*  PROCALCITON 0.11  --  <0.10 <0.10 <0.10 <0.10  LATICACIDVEN 1.3  --   --   --   --   --   BNP  --   --  54.9 40.1 29.4 39.2  MG  --   --  2.3 2.1 2.1 2.0  CALCIUM 8.9 8.6*  --  8.8* 9.1 9.1     Lab Results  Component Value Date   HGBA1C 5.6 11/15/2022    Radiology Reports DG Chest Port 1 View  Result Date: 01/27/2023 CLINICAL  DATA:  Shortness of breath. EXAM: PORTABLE CHEST 1 VIEW COMPARISON:  Radiograph 01/24/2023. Lung  bases from abdominal CT 01/22/2021 FINDINGS: Left-sided pacemaker in stable positioning. Unchanged heart size and mediastinal contours. Bilateral lower lobe collapse, with slightly improved aeration in the periphery of the right lower lobe from prior. There may be a small right pleural effusion. No pulmonary edema or pneumothorax. IMPRESSION: Bilateral lower lobe collapse, with slightly improved aeration in the periphery of the right lower lobe from prior. Possible small right pleural effusion. Electronically Signed   By: Narda Rutherford M.D.   On: 01/27/2023 14:48   DG Swallowing Func-Speech Pathology  Result Date: 01/25/2023 Table formatting from the original result was not included. Modified Barium Swallow Study Patient Details Name: Cynthia Underwood MRN: [id number] Date of Birth: 06-Jul-1971 Today's Date: 01/25/2023 HPI/PMH: HPI: Patient is a 52 y.o. female with PMH: myotonic dystrophy, chronic dysphagia, hypothyroidism, total thyroidectomy 2022, anxiety, heart block with pacer, HFrEF. She was recently admitted in March 2024 for PNA and hypoxic respiratory failure complicated by VF arrest, requiring intubation then trach placement. She was transferred from [facility] where she had PEG placed due to severe dysphagia, was decannulated and ultimately dischaged home on 01/19/23. She presented to the hospital for current admission, on 01/23/23 due to ongoing cough, diarrhea and low grade fevers. She has long-standing hx of dysphagia that was first identified when she was living in [place]. 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  She had worsening of dysphagia and in 10/2020 was found to have a mulitnodular thyroid goiter. CT cited mild to moderate mass effect upon the esophagus.  Esophagram 10/21/20: "Marked difficulty in initiating swallowing with delayed oropharyngeal phase and large amount of residue in the piriform sinuses and vallecula. No frank aspiration seen. Mild narrowing in the  lower cervical esophagus, likely related to thyroid goiter."  She underwent a total thyroidectomy 04/24/21.  She had an MBS while admitted to [facility] which revealed severe dysphagia. She currently takes only ice and small sips of water by mouth; otherwise all nutrition via G-tube. Clinical Impression: Clinical Impression: Pt presents with a severe dysphagia that is primarily related to her dx of myotonic dystrophy type 1 with potential secondary effects from her 2022 total thyroidectomy. The swallow is impacted by nearly absent pharyngeal stripping wave and minimal base-of-tongue retraction.  There is reduced laryngeal elevation.  These factors lead to minimal movement of POs through the pharynx.  She was able to protect her airway surprisingly well, with only high/transient penetration of thin liquids into the laryngeal vestibule, no aspiration, but repetitive effort and subswallows necessary in order to transfer small portions of a single bolus through her UES.  There was almost total stasis of purees in pharynx, requiring oral suctioning and great effort to expectorate/remove barium.  Continue NPO andTF as primary source of nutrition; allow small sips of water/ice chips per pt's preferences. With therapy, she may be able to drink larger volumes of thin liquid in the future.  SLP will follow while admiited; she will need intensive therapy  after discharge. Factors that may increase risk of adverse event in presence of aspiration Rubye Oaks & Clearance Coots 2021): Factors that may increase risk of adverse event in presence of aspiration Rubye Oaks & Clearance Coots 2021): Frail or deconditioned; Weak cough; Limited mobility Recommendations/Plan: Swallowing Evaluation Recommendations Swallowing Evaluation Recommendations Recommendations: NPO; Ice chips PRN after oral care Medication Administration: Via alternative means Supervision: Patient able to self-feed Oral  care recommendations: Oral care QID (4x/day); Oral care before ice  chips/water Treatment Plan Treatment Plan Treatment recommendations: Therapy as outlined in treatment plan below Follow-up recommendations: Other (comment) (tba) Treatment frequency: Min 2x/week Treatment duration: 2 weeks Recommendations Recommendations for follow up therapy are one component of a multi-disciplinary discharge planning process, led by the attending physician.  Recommendations may be updated based on patient status, additional functional criteria and insurance authorization. Assessment: Orofacial Exam: Orofacial Exam Oral Cavity: Oral Hygiene: WFL Oral Cavity - Dentition: Adequate natural dentition Oral Motor/Sensory Function: Suspected cranial nerve impairment (bilateral buccinator weakness per neurology assessment as OP) Anatomy: Anatomy: Other (Comment) (surgical clips from thyroidectomy visible) Boluses Administered: Boluses Administered Boluses Administered: Thin liquids (Level 0); Puree  Oral Impairment Domain: Oral Impairment Domain Lip Closure: No labial escape Tongue control during bolus hold: Escape to lateral buccal cavity/floor of mouth Oral residue: Trace residue lining oral structures Location of oral residue : Tongue Initiation of pharyngeal swallow : Posterior laryngeal surface of the epiglottis  Pharyngeal Impairment Domain: Pharyngeal Impairment Domain Soft palate elevation: No bolus between soft palate (SP)/pharyngeal wall (PW) Laryngeal elevation: Minimal superior movement of thyroid cartilage with minimal approximation of arytenoids to epiglottic petiole Anterior hyoid excursion: Partial anterior movement Epiglottic movement: Partial inversion Laryngeal vestibule closure: Incomplete, narrow column air/contrast in laryngeal vestibule Pharyngeal stripping wave : Absent Pharyngeal contraction (A/P view only): N/A Pharyngoesophageal segment opening: Minimal distention/minimal duration, marked obstruction of flow Tongue base retraction: Wide column of contrast or air between tongue  base and PPW Pharyngeal residue: Majority of contrast within or on pharyngeal structures Location of pharyngeal residue: Valleculae; Pyriform sinuses  Esophageal Impairment Domain: Esophageal Impairment Domain Esophageal clearance upright position: -- (not viewed) Pill: Esophageal Impairment Domain Esophageal clearance upright position: -- (not viewed) Penetration/Aspiration Scale Score: Penetration/Aspiration Scale Score 2.  Material enters airway, remains ABOVE vocal cords then ejected out: Thin liquids (Level 0) Compensatory Strategies: Compensatory Strategies Compensatory strategies: Yes Effortful swallow: Ineffective Chin tuck: Ineffective Left head turn: Ineffective Ineffective Left Head Turn: Thin liquid (Level 0) Right head turn: Ineffective Ineffective Right Head Turn: Thin liquid (Level 0)   General Information: Caregiver present: No  Diet Prior to this Study: NPO; G-tube   Temperature : Normal   No data recorded  Supplemental O2: Nasal cannula   History of Recent Intubation: No  Behavior/Cognition: Alert; Cooperative No data recorded Baseline vocal quality/speech: Dysphonic Volitional Cough: Able to elicit Volitional Swallow: Able to elicit No data recorded Goal Planning: Prognosis for improved oropharyngeal function: Good No data recorded No data recorded Patient/Family Stated Goal: frustrated by only being able to have ice chips No data recorded Pain: Pain Assessment Pain Assessment: Faces Faces Pain Scale: 0 Breathing: 0 Negative Vocalization: 0 Facial Expression: 1 Body Language: 1 Consolability: 1 PAINAD Score: 3 Facial Expression: 0 Body Movements: 0 Muscle Tension: 0 Compliance with ventilator (intubated pts.): 0 Vocalization (extubated pts.): N/A CPOT Total: 0 Pain Location: chest wall, due to coughing Pain Descriptors / Indicators: Grimacing; Discomfort Pain Intervention(s): Limited activity within patient's tolerance; Monitored during session; Repositioned End of Session: Start Time:SLP Start  Time (ACUTE ONLY): 1353 Stop Time: SLP Stop Time (ACUTE ONLY): 1425 Time Calculation:SLP Time Calculation (min) (ACUTE ONLY): 32 min Charges: SLP Evaluations $ SLP Speech Visit: 1 Visit SLP Evaluations $BSS Swallow: 1 Procedure $MBS Swallow: 1 Procedure $ SLP Eval Voice Prosthetic Device: Procedure $$ Passy Muir Speaking Valve: yes $Speech Treatment for Individual: 1 Procedure SLP visit diagnosis: SLP Visit Diagnosis: Dysphagia, oropharyngeal  phase (R13.12) Past Medical History: Past Medical History: Diagnosis Date  Bifascicular block 10/02/2014  Cataract   Dyspnea   Excess ear wax   Multinodular goiter   Pneumonia   Presence of permanent cardiac pacemaker   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  Sickle cell trait (HCC)   Watery eyes   Left  Weakness of both legs  Past Surgical History: Past Surgical History: Procedure Laterality Date  BREAST BIOPSY Right   EYE SURGERY Left 2017  "related to blockage in my nose"  EYE SURGERY Right 05/2019  INSERT / REPLACE / REMOVE PACEMAKER  02/15/2017  IR GASTROSTOMY TUBE MOD SED  01/01/2023  PACEMAKER IMPLANT N/A 02/15/2017  Procedure: Pacemaker Implant;  Surgeon: Duke Salvia, MD;  Location: [facility] INVASIVE CV LAB;  Service: Cardiovascular;  Laterality: N/A;  RIGHT/LEFT HEART CATH AND CORONARY ANGIOGRAPHY N/A 11/16/2022  Procedure: RIGHT/LEFT HEART CATH AND CORONARY ANGIOGRAPHY;  Surgeon: Laurey Morale, MD;  Location: [facility] INVASIVE CV LAB;  Service: Cardiovascular;  Laterality: N/A;  THYROIDECTOMY N/A 04/21/2021  Procedure: TOTAL THYROIDECTOMY;  Surgeon: Axel Filler, MD;  Location: [facility] OR;  Service: General;  Laterality: N/A; Carolan Shiver 01/25/2023, 3:40 PM Amanda L. Samson Frederic, MA CCC/SLP Clinical Specialist - Acute Care SLP Acute Rehabilitation Services Office number [phone]  DG Abd 1 View  Result Date: 01/24/2023 CLINICAL DATA:  Foreign body. EXAM: ABDOMEN - 1 VIEW COMPARISON:  Jan 05, 2023. FINDINGS: The bowel gas  pattern is normal. Gastrostomy tube is seen well positioned over gastric air shadow. No radio-opaque calculi or other significant radiographic abnormality are seen. IMPRESSION: Gastrostomy tube is noted.  No abnormal bowel dilatation. Electronically Signed   By: Lupita Raider M.D.   On: 01/24/2023 13:05      Signature  -   Susa Raring M.D on 01/28/2023 at 8:20 AM   -  To page go to [email or web address]

## 2023-01-29 DIAGNOSIS — R6510 Systemic inflammatory response syndrome (SIRS) of non-infectious origin without acute organ dysfunction: Secondary | ICD-10-CM | POA: Diagnosis not present

## 2023-01-29 LAB — GLUCOSE, CAPILLARY
Glucose-Capillary: 83 mg/dL (ref 70–99)
Glucose-Capillary: 120 mg/dL — ABNORMAL HIGH (ref 70–99)
Glucose-Capillary: 100 mg/dL — ABNORMAL HIGH (ref 70–99)
Glucose-Capillary: 93 mg/dL (ref 70–99)
Glucose-Capillary: 98 mg/dL (ref 70–99)

## 2023-01-29 MED ORDER — JEVITY 1.5 CAL/FIBER PO LIQD
237.0000 mL | Freq: Four times a day (QID) | ORAL | Status: DC
  Administered 2023-01-29 – 2023-01-30 (×4): 237 mL
  Filled 2023-01-29 (×7): qty 237

## 2023-01-29 MED ORDER — FREE WATER
150.0000 mL | Freq: Three times a day (TID) | Status: DC
  Administered 2023-01-29 – 2023-01-30 (×3): 150 mL

## 2023-01-29 MED ORDER — JEVITY 1.5 CAL/FIBER PO LIQD
40.0000 mL | ORAL | Status: DC
  Filled 2023-01-29 (×2): qty 237

## 2023-01-29 MED ORDER — PROSOURCE TF20 ENFIT COMPATIBL EN LIQD
60.0000 mL | Freq: Two times a day (BID) | ENTERAL | Status: DC
  Administered 2023-01-29 – 2023-01-30 (×2): 60 mL
  Filled 2023-01-29 (×2): qty 60

## 2023-01-29 NOTE — Progress Notes (Signed)
PROGRESS NOTE                                                                                                                                                                                                             Patient Demographics:    Cynthia Underwood, is a 52 y.o. female, DOB - 13-Apr-1971, ZOX:[id number]  Outpatient Primary MD for the patient is Shayne Alken, MD    LOS - 6  Admit date - 01/22/2023    Chief Complaint  Patient presents with   Weakness   Diarrhea       Brief Narrative (HPI from H&P)    52 y.o. female with medical history significant of myotonic dystrophy, hypothyroidism, anxiety, heart block with pacer, HFrEF, and admission in 11/2022 for pneumonia with hypoxic respiratory failure failure that was complicated by VF arrest, was recently hospitalized to the [facility], she was discharged from the [facility] on 01/19/2023.  She now presents with ongoing cough, diarrhea and low-grade fevers.  Diagnosed with aspiration pneumonia and admitted to the hospital, patient claims that prior to her discharge she had few episodes of nausea and vomiting at the [facility] facility.   Subjective:   Patient in bed, appears comfortable, denies any headache, no fever, no chest pain or pressure, no shortness of breath , no abdominal pain. No focal weakness.  Not sleep well as her tube feed pump was beeping multiple times throughout the night, will prefer getting bolus tube feeds.   Assessment  & Plan :   Aspiration pneumonia.  No sepsis.  Had some emesis prior to her discharge from the LTAC facility and likely aspirated due to that, much improved on empiric antibiotic, already n.p.o. with tube feeds via PEG tube which will be continued.  Speech to evaluate, encouraged to sit in chair use I-S and flutter valve.  Follow cultures.  Clinically much improved likely discharge to SNF in  the next day or 2.  Chronic hypoxic respiratory failure.  On baseline 2 L nasal cannula.    Diarrhea.  Due to tube feeds, Imodium added, GI pathogen panel stable, tube feed rate reduced 01/26/23 >> much better 01/27/23.  History of chronic systolic heart failure EF 20% due to nonischemic cardiomyopathy. History of V-fib arrest - March 2024.  Follows with Dr. Marca Ancona.  Currently compensated.  She is on digoxin at baseline which will be continued.  V-fib arrest was thought to be due to reversible causes by the cardiology team hence defibrillator was not pursued.  High-grade heart block.  Has pacemaker.    History of myotonic dystrophy.  Generalized weakness, dysphagia, previous history of trach now decannulated with healed stoma.    Dysphagia -  with PEG tube feed.  Dietitian consulted on 01/29/2023 per patient request to adjust tube feeds further.  Anxiety.  On Seroquel.  Hypothyroidism.  Continue Synthroid.  Stable TSH.  GERD.  On PPI.  Possible artifact versus foreign body in the GI tract noted on CT scan in the ER.  Discussed with GI Dr. Adela Lank subsequent x-ray stable.  No further workup.        Condition - Extremely Guarded  Family Communication  :  message left for [name] [phone], 01/24/2023 at 9:20 AM  Code Status :  Full  Consults  :  None  PUD Prophylaxis : PPI   Procedures  :     CT - 1. No acute intra-abdominal pathology identified. No definite radiographic explanation for the patient's reported symptoms. 2. Pull-through type gastrostomy catheter in appropriate position. 3. 2 cm linear metallic density intraluminally within the bowel within the right hemipelvis. This may represent an ingested foreign body. No surrounding inflammatory stranding or loculated fluid collection to suggest perforation. 4. Persistent dense collapse and consolidation of the lower lobes bilaterally with marked volume loss. Again noted is bronchiectasis and multifocal airway impaction  within the collapsed segments. While this appears stable since prior examination, this is new since remote prior examination of 11/11/2022.      Disposition Plan  :    Status is: Inpatient  DVT Prophylaxis  :    enoxaparin (LOVENOX) injection 40 mg Start: 01/23/23 0915    Lab Results  Component Value Date   PLT 243 01/27/2023    Diet :  Diet Order             Diet clear liquid Room service appropriate? Yes; Fluid consistency: Thin  Diet effective now                    Inpatient Medications  Scheduled Meds:  digoxin  0.25 mg Per Tube Daily   doxycycline  100 mg Oral Q12H   enoxaparin (LOVENOX) injection  40 mg Subcutaneous Q24H   feeding supplement (PROSource TF20)  60 mL Per Tube Daily   free water  170 mL Per Tube Q6H   guaiFENesin  400 mg Oral TID   levothyroxine  112 mcg Per Tube Q0600   metoCLOPramide  5 mg Per Tube BID WC   metoprolol tartrate  12.5 mg Per Tube BID   pantoprazole  40 mg Oral Daily   QUEtiapine  25 mg Per Tube QHS   saccharomyces boulardii  250 mg Per Tube BID   sodium chloride flush  3 mL Intravenous Q12H   spironolactone  12.5 mg Per Tube Daily   Continuous Infusions:  cefTRIAXone (ROCEPHIN)  IV 2 g (01/28/23 2137)   feeding supplement (JEVITY 1.5 CAL/FIBER)     PRN Meds:.acetaminophen **OR** acetaminophen, albuterol, loperamide    Objective:   Vitals:   01/28/23 1600 01/28/23 1925 01/28/23 2304 01/29/23 0410  BP: 99/65 99/62 93/71  104/75  Pulse: (!) 55   88  Resp: 19 20 19 19   Temp: 98 F (36.7 C) 97.7 F (36.5 C) 97.9 F (36.6 C) 98.2 F (  36.8 C)  TempSrc:  Temporal Temporal Oral  SpO2: 100% 98% 97%   Weight:        Wt Readings from Last 3 Encounters:  01/26/23 45.4 kg  12/06/22 39.9 kg  11/08/22 44.7 kg    No intake or output data in the 24 hours ending 01/29/23 0757     Physical Exam  Weak and frail middle-aged African-American female, awake Alert, No new F.N deficits, trach site stoma has healed, PEG  tube in place [place].AT,PERRAL Supple Neck, No JVD,   Symmetrical Chest wall movement, Mod air movement bilaterally, CTAB RRR,No Gallops,Rubs or new Murmurs,  +ve B.Sounds, Abd Soft, No tenderness,   No Cyanosis, Clubbing or edema        Data Review:    Recent Labs  Lab 01/22/23 2311 01/24/23 0415 01/25/23 0402 01/26/23 0408 01/27/23 0237  WBC 20.0* 7.9 5.2 5.2 5.9  HGB 12.1 9.7* 10.6* 11.2* 10.8*  HCT 38.6 30.7* 34.3* 35.5* 34.6*  PLT 255 206 231 239 243  MCV 99.2 97.5 98.3 98.3 98.9  MCH 31.1 30.8 30.4 31.0 30.9  MCHC 31.3 31.6 30.9 31.5 31.2  RDW 16.0* 16.1* 16.2* 15.9* 15.8*  LYMPHSABS  --   --  2.0 2.2 2.4  MONOABS  --   --  0.4 0.4 0.4  EOSABS  --   --  0.1 0.2 0.2  BASOSABS  --   --  0.0 0.0 0.0    Recent Labs  Lab 01/22/23 2311 01/24/23 0415 01/24/23 0645 01/25/23 0402 01/26/23 0408 01/27/23 0237  NA 136 138  --  141 141 142  K 4.0 3.4*  --  3.8 3.6 3.8  CL 100 103  --  105 103 103  CO2 25 28  --  29 30 32  ANIONGAP 11 7  --  7 8 7   GLUCOSE 120* 94  --  98 86 103*  BUN 12 10  --  12 10 11   CREATININE 0.55 0.48  --  0.45 0.45 0.44  AST 24  --   --   --   --   --   ALT 39  --   --   --   --   --   ALKPHOS 71  --   --   --   --   --   BILITOT 0.4  --   --   --   --   --   ALBUMIN 2.6*  --   --   --   --   --   CRP  --   --  11.1* 4.2* 1.9* 1.2*  PROCALCITON 0.11  --  <0.10 <0.10 <0.10 <0.10  LATICACIDVEN 1.3  --   --   --   --   --   BNP  --   --  54.9 40.1 29.4 39.2  MG  --   --  2.3 2.1 2.1 2.0  CALCIUM 8.9 8.6*  --  8.8* 9.1 9.1      Recent Labs  Lab 01/22/23 2311 01/24/23 0415 01/24/23 0645 01/25/23 0402 01/26/23 0408 01/27/23 0237  CRP  --   --  11.1* 4.2* 1.9* 1.2*  PROCALCITON 0.11  --  <0.10 <0.10 <0.10 <0.10  LATICACIDVEN 1.3  --   --   --   --   --   BNP  --   --  54.9 40.1 29.4 39.2  MG  --   --  2.3 2.1 2.1 2.0  CALCIUM 8.9 8.6*  --  8.8* 9.1 9.1     Lab Results  Component Value Date   HGBA1C 5.6 11/15/2022    Radiology  Reports DG Chest Port 1 View  Result Date: 01/27/2023 CLINICAL DATA:  Shortness of breath. EXAM: PORTABLE CHEST 1 VIEW COMPARISON:  Radiograph 01/24/2023. Lung bases from abdominal CT 01/22/2021 FINDINGS: Left-sided pacemaker in stable positioning. Unchanged heart size and mediastinal contours. Bilateral lower lobe collapse, with slightly improved aeration in the periphery of the right lower lobe from prior. There may be a small right pleural effusion. No pulmonary edema or pneumothorax. IMPRESSION: Bilateral lower lobe collapse, with slightly improved aeration in the periphery of the right lower lobe from prior. Possible small right pleural effusion. Electronically Signed   By: Narda Rutherford M.D.   On: 01/27/2023 14:48   DG Swallowing Func-Speech Pathology  Result Date: 01/25/2023 Table formatting from the original result was not included. Modified Barium Swallow Study Patient Details Name: SHAMAR HOLAND MRN: [id number] Date of Birth: 30-Dec-1970 Today's Date: 01/25/2023 HPI/PMH: HPI: Patient is a 52 y.o. female with PMH: myotonic dystrophy, chronic dysphagia, hypothyroidism, total thyroidectomy 2022, anxiety, heart block with pacer, HFrEF. She was recently admitted in March 2024 for PNA and hypoxic respiratory failure complicated by VF arrest, requiring intubation then trach placement. She was transferred from [facility] to [facility] where she had PEG placed due to severe dysphagia, was decannulated and ultimately dischaged home on 01/19/23. She presented to the hospital for current admission, on 01/23/23 due to ongoing cough, diarrhea and low grade fevers. She has long-standing hx of dysphagia that was first identified when she was living in [place]. 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  She had worsening of dysphagia and in 10/2020 was found to have a mulitnodular thyroid goiter. CT cited mild to moderate mass effect upon the esophagus.  Esophagram 10/21/20: "Marked difficulty in  initiating swallowing with delayed oropharyngeal phase and large amount of residue in the piriform sinuses and vallecula. No frank aspiration seen. Mild narrowing in the lower cervical esophagus, likely related to thyroid goiter."  She underwent a total thyroidectomy 04/24/21.  She had an MBS while admitted to [facility] which revealed severe dysphagia. She currently takes only ice and small sips of water by mouth; otherwise all nutrition via G-tube. Clinical Impression: Clinical Impression: Pt presents with a severe dysphagia that is primarily related to her dx of myotonic dystrophy type 1 with potential secondary effects from her 2022 total thyroidectomy. The swallow is impacted by nearly absent pharyngeal stripping wave and minimal base-of-tongue retraction.  There is reduced laryngeal elevation.  These factors lead to minimal movement of POs through the pharynx.  She was able to protect her airway surprisingly well, with only high/transient penetration of thin liquids into the laryngeal vestibule, no aspiration, but repetitive effort and subswallows necessary in order to transfer small portions of a single bolus through her UES.  There was almost total stasis of purees in pharynx, requiring oral suctioning and great effort to expectorate/remove barium.  Continue NPO andTF as primary source of nutrition; allow small sips of water/ice chips per pt's preferences. With therapy, she may be able to drink larger volumes of thin liquid in the future.  SLP will follow while admiited; she will need intensive therapy  after discharge. Factors that may increase risk of adverse event in presence of aspiration Rubye Oaks & Clearance Coots 2021): Factors that may increase risk of adverse event in presence of aspiration Rubye Oaks & Clearance Coots 2021):  Frail or deconditioned; Weak cough; Limited mobility Recommendations/Plan: Swallowing Evaluation Recommendations Swallowing Evaluation Recommendations Recommendations: NPO; Ice chips PRN after oral care  Medication Administration: Via alternative means Supervision: Patient able to self-feed Oral care recommendations: Oral care QID (4x/day); Oral care before ice chips/water Treatment Plan Treatment Plan Treatment recommendations: Therapy as outlined in treatment plan below Follow-up recommendations: Other (comment) (tba) Treatment frequency: Min 2x/week Treatment duration: 2 weeks Recommendations Recommendations for follow up therapy are one component of a multi-disciplinary discharge planning process, led by the attending physician.  Recommendations may be updated based on patient status, additional functional criteria and insurance authorization. Assessment: Orofacial Exam: Orofacial Exam Oral Cavity: Oral Hygiene: WFL Oral Cavity - Dentition: Adequate natural dentition Oral Motor/Sensory Function: Suspected cranial nerve impairment (bilateral buccinator weakness per neurology assessment as OP) Anatomy: Anatomy: Other (Comment) (surgical clips from thyroidectomy visible) Boluses Administered: Boluses Administered Boluses Administered: Thin liquids (Level 0); Puree  Oral Impairment Domain: Oral Impairment Domain Lip Closure: No labial escape Tongue control during bolus hold: Escape to lateral buccal cavity/floor of mouth Oral residue: Trace residue lining oral structures Location of oral residue : Tongue Initiation of pharyngeal swallow : Posterior laryngeal surface of the epiglottis  Pharyngeal Impairment Domain: Pharyngeal Impairment Domain Soft palate elevation: No bolus between soft palate (SP)/pharyngeal wall (PW) Laryngeal elevation: Minimal superior movement of thyroid cartilage with minimal approximation of arytenoids to epiglottic petiole Anterior hyoid excursion: Partial anterior movement Epiglottic movement: Partial inversion Laryngeal vestibule closure: Incomplete, narrow column air/contrast in laryngeal vestibule Pharyngeal stripping wave : Absent Pharyngeal contraction (A/P view only): N/A  Pharyngoesophageal segment opening: Minimal distention/minimal duration, marked obstruction of flow Tongue base retraction: Wide column of contrast or air between tongue base and PPW Pharyngeal residue: Majority of contrast within or on pharyngeal structures Location of pharyngeal residue: Valleculae; Pyriform sinuses  Esophageal Impairment Domain: Esophageal Impairment Domain Esophageal clearance upright position: -- (not viewed) Pill: Esophageal Impairment Domain Esophageal clearance upright position: -- (not viewed) Penetration/Aspiration Scale Score: Penetration/Aspiration Scale Score 2.  Material enters airway, remains ABOVE vocal cords then ejected out: Thin liquids (Level 0) Compensatory Strategies: Compensatory Strategies Compensatory strategies: Yes Effortful swallow: Ineffective Chin tuck: Ineffective Left head turn: Ineffective Ineffective Left Head Turn: Thin liquid (Level 0) Right head turn: Ineffective Ineffective Right Head Turn: Thin liquid (Level 0)   General Information: Caregiver present: No  Diet Prior to this Study: NPO; G-tube   Temperature : Normal   No data recorded  Supplemental O2: Nasal cannula   History of Recent Intubation: No  Behavior/Cognition: Alert; Cooperative No data recorded Baseline vocal quality/speech: Dysphonic Volitional Cough: Able to elicit Volitional Swallow: Able to elicit No data recorded Goal Planning: Prognosis for improved oropharyngeal function: Good No data recorded No data recorded Patient/Family Stated Goal: frustrated by only being able to have ice chips No data recorded Pain: Pain Assessment Pain Assessment: Faces Faces Pain Scale: 0 Breathing: 0 Negative Vocalization: 0 Facial Expression: 1 Body Language: 1 Consolability: 1 PAINAD Score: 3 Facial Expression: 0 Body Movements: 0 Muscle Tension: 0 Compliance with ventilator (intubated pts.): 0 Vocalization (extubated pts.): N/A CPOT Total: 0 Pain Location: chest wall, due to coughing Pain Descriptors /  Indicators: Grimacing; Discomfort Pain Intervention(s): Limited activity within patient's tolerance; Monitored during session; Repositioned End of Session: Start Time:SLP Start Time (ACUTE ONLY): 1353 Stop Time: SLP Stop Time (ACUTE ONLY): 1425 Time Calculation:SLP Time Calculation (min) (ACUTE ONLY): 32 min Charges: SLP Evaluations $ SLP Speech Visit: 1 Visit SLP Evaluations $BSS Swallow:  1 Procedure $MBS Swallow: 1 Procedure $ SLP Eval Voice Prosthetic Device: Procedure $$ Passy Muir Speaking Valve: yes $Speech Treatment for Individual: 1 Procedure SLP visit diagnosis: SLP Visit Diagnosis: Dysphagia, oropharyngeal phase (R13.12) Past Medical History: Past Medical History: Diagnosis Date  Bifascicular block 10/02/2014  Cataract   Dyspnea   Excess ear wax   Multinodular goiter   Pneumonia   Presence of permanent cardiac pacemaker   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  Sickle cell trait (HCC)   Watery eyes   Left  Weakness of both legs  Past Surgical History: Past Surgical History: Procedure Laterality Date  BREAST BIOPSY Right   EYE SURGERY Left 2017  "related to blockage in my nose"  EYE SURGERY Right 05/2019  INSERT / REPLACE / REMOVE PACEMAKER  02/15/2017  IR GASTROSTOMY TUBE MOD SED  01/01/2023  PACEMAKER IMPLANT N/A 02/15/2017  Procedure: Pacemaker Implant;  Surgeon: Duke Salvia, MD;  Location: [facility] INVASIVE CV LAB;  Service: Cardiovascular;  Laterality: N/A;  RIGHT/LEFT HEART CATH AND CORONARY ANGIOGRAPHY N/A 11/16/2022  Procedure: RIGHT/LEFT HEART CATH AND CORONARY ANGIOGRAPHY;  Surgeon: Laurey Morale, MD;  Location: [facility] INVASIVE CV LAB;  Service: Cardiovascular;  Laterality: N/A;  THYROIDECTOMY N/A 04/21/2021  Procedure: TOTAL THYROIDECTOMY;  Surgeon: Axel Filler, MD;  Location: [facility] OR;  Service: General;  Laterality: N/A; Carolan Shiver 01/25/2023, 3:40 PM Amanda L. Samson Frederic, MA CCC/SLP Clinical Specialist - Acute Care SLP Acute Rehabilitation Services  Office number [phone]     Signature  -   Susa Raring M.D on 01/29/2023 at 7:57 AM   -  To page go to [email or web address]

## 2023-01-29 NOTE — TOC Progression Note (Signed)
Transition of Care [facility]) - Progression Note    Patient Details  Name: Cynthia Underwood MRN: [id number] Date of Birth: February 03, 1971  Transition of Care [facility]) CM/SW Contact  Mearl Latin, LCSW Phone Number: 01/29/2023, 9:30 AM  Clinical Narrative:    Insurance approval received for [employer], Ref# [id number], Auth ID# [id number], effective 01/27/2023-01/31/2023.   Expected Discharge Plan: Skilled Nursing Facility Barriers to Discharge: Continued Medical Work up, English as a second language teacher, SNF Pending bed offer  Expected Discharge Plan and Services In-house Referral: Clinical Social Work   Post Acute Care Choice: Home Health, Skilled Nursing Facility Living arrangements for the past 2 months: Single Family Home                                       Social Determinants of Health (SDOH) Interventions SDOH Screenings   Food Insecurity: No Food Insecurity (01/23/2023)  Housing: Low Risk  (01/23/2023)  Transportation Needs: No Transportation Needs (01/23/2023)  Utilities: Not At Risk (01/23/2023)  Alcohol Screen: Low Risk  (10/16/2022)  Depression (PHQ2-9): Medium Risk (11/08/2022)  Financial Resource Strain: Low Risk  (10/16/2022)  Physical Activity: Inactive (10/16/2022)  Social Connections: Socially Isolated (10/16/2022)  Stress: Stress Concern Present (10/16/2022)  Tobacco Use: High Risk (01/23/2023)    Readmission Risk Interventions     No data to display

## 2023-01-29 NOTE — Progress Notes (Signed)
Nutrition Follow-up  DOCUMENTATION CODES:   Severe malnutrition in context of chronic illness, Underweight  INTERVENTION:  Transition to Bolus Tube feeds via PEG: 237 mL (1 carton) Jevity 1.5 - QID  60 mL ProSource TF20 - BID Free water flush: 150 mL - TID Tube feeds at goal provides 1582 kcal, 100 gm protein, and 1470 mL total free water daily.  NUTRITION DIAGNOSIS:   Severe Malnutrition related to chronic illness (myotonic dystrophy) as evidenced by severe muscle depletion, severe fat depletion. - Being addressed via TF  GOAL:   Patient will meet greater than or equal to 90% of their needs - Met via TF  MONITOR:   PO intake, Labs, TF tolerance, I & O's, Weight trends  REASON FOR ASSESSMENT:   Consult Enteral/tube feeding initiation and management  ASSESSMENT:   52 y.o. female presented to the ED with diarrhea and fevers x 2 days. PMH includes CHF, myotonic dystrophy, dysphagia s/p PEG tube, and cardiac arrest. Pt admitted with SIRS, likely from pneumonia.   5/20 - Admitted 5/22 - transition to Jevity 1.5 tube feeds 5/24 - diet advanced to thin liquids  RD received a consult to transition to bolus tube feeds.   Pt sleeping at time of RD visit, did not wake to RD voice. Tube feeds running at 40 mL/hr. Per MD notes, pt complaining of increased bloating and diarrhea with tube feeds at goal. Pt requested to go to bolus feeds for better sleep at night.   Discussed with MD, going to assess if pt will tolerate full bolus feeds. Pt plan to dc to SNF soon.   Medications reviewed and include: Doxycyline, Reglan, Protonix, Florastor, Spironolactone, IV antibiotics  Labs reviewed: Sodium 142, Potassium 3.8 CBGs: 72-108 x 24 hours   Diet Order:   Diet Order             Diet clear liquid Room service appropriate? Yes; Fluid consistency: Thin  Diet effective now                   EDUCATION NEEDS:   No education needs have been identified at this time  Skin:  Skin  Assessment: Reviewed RN Assessment  Last BM:  5/22  Height:  Ht Readings from Last 1 Encounters:  11/11/22 5\' 3"  (1.6 m)   Weight:  Wt Readings from Last 1 Encounters:  01/26/23 45.4 kg   Ideal Body Weight:  52.3 kg  BMI:  Body mass index is 17.73 kg/m.  Estimated Nutritional Needs:  Kcal:  1500-1700 Protein:  75-95 grams Fluid:  >/= 1.5 L   Kirby Crigler RD, LDN Clinical Dietitian See [facility] for contact information.

## 2023-01-29 NOTE — Progress Notes (Signed)
Physical Therapy Treatment Patient Details Name: Cynthia Underwood MRN: [id number] DOB: [birth year] Today's Date: 01/29/2023   History of Present Illness 52 yo female with onset of fever and diarrhea was admitted on 5/20 and noted SIRS, pleural effusion, bronchiectasis, bibasilar infiltrates.  Sepsis and SIRS from CAP. Has suspected c-diff.  PMHx: malnutrition, hypothyroidism, anxiety, myotonic dystrophy, dysphagia, O2 dependence, GERD, CHF with EF 20%, pacer, heart block, chronic respiratory failure, hypoxia, trach 11/2022    PT Comments    Pt greeted resting in bed and agreeable to session with good progress towards acute goals. Pt demonstrating transfers and gait with grossly min guard assist without AD. Pt needing intermittent min A during gait as pt fatigue increasing for balance support as well as cues for upright posture and self pacing. Educated pt on importance of continued mobility and time up OOB with pt verbalizing understanding. Current plan remains appropriate to address deficits and maximize functional independence and decrease caregiver burden. Pt continues to benefit from skilled PT services to progress toward functional mobility goals.    Recommendations for follow up therapy are one component of a multi-disciplinary discharge planning process, led by the attending physician.  Recommendations may be updated based on patient status, additional functional criteria and insurance authorization.  Follow Up Recommendations       Assistance Recommended at Discharge Frequent or constant Supervision/Assistance  Patient can return home with the following A little help with walking and/or transfers;A little help with bathing/dressing/bathroom;Assistance with cooking/housework;Assist for transportation;Help with stairs or ramp for entrance   Equipment Recommendations  None recommended by PT    Recommendations for Other Services       Precautions / Restrictions  Precautions Precautions: Fall Precaution Comments: watch sats with mobility Restrictions Weight Bearing Restrictions: No     Mobility  Bed Mobility Overal bed mobility: Needs Assistance Bed Mobility: Sit to Supine, Supine to Sit     Supine to sit: Min guard Sit to supine: Min assist   General bed mobility comments: min A to bring LE back into bed at end of session    Transfers Overall transfer level: Needs assistance Equipment used: Rolling walker (2 wheels), None Transfers: Sit to/from Stand, Bed to chair/wheelchair/BSC Sit to Stand: Min guard           General transfer comment: min guard for safety    Ambulation/Gait Ambulation/Gait assistance: Min guard, Min assist Gait Distance (Feet): 350 Feet Assistive device: None Gait Pattern/deviations: Decreased stride length, Narrow base of support, Step-through pattern Gait velocity: reduced     General Gait Details: min guard for safety and light asssit intermittently for balance without AD   Stairs             Wheelchair Mobility    Modified Rankin (Stroke Patients Only)       Balance Overall balance assessment: Needs assistance Sitting-balance support: Feet supported Sitting balance-Leahy Scale: Fair     Standing balance support: Single extremity supported Standing balance-Leahy Scale: Fair Standing balance comment: requires minor support to balance when stepping                            Cognition Arousal/Alertness: Awake/alert Behavior During Therapy: WFL for tasks assessed/performed, Anxious Overall Cognitive Status: Within Functional Limits for tasks assessed  Exercises      General Comments General comments (skin integrity, edema, etc.): VSS on RA      Pertinent Vitals/Pain Pain Assessment Pain Assessment: No/denies pain Faces Pain Scale: No hurt    Home Living                          Prior  Function            PT Goals (current goals can now be found in the care plan section) Acute Rehab PT Goals PT Goal Formulation: With patient Time For Goal Achievement: 02/07/23 Progress towards PT goals: Progressing toward goals    Frequency    Min 3X/week      PT Plan      Co-evaluation              AM-PAC PT "6 Clicks" Mobility   Outcome Measure  Help needed turning from your back to your side while in a flat bed without using bedrails?: A Little Help needed moving from lying on your back to sitting on the side of a flat bed without using bedrails?: A Little Help needed moving to and from a bed to a chair (including a wheelchair)?: A Little Help needed standing up from a chair using your arms (e.g., wheelchair or bedside chair)?: A Little Help needed to walk in hospital room?: A Little Help needed climbing 3-5 steps with a railing? : Total 6 Click Score: 16    End of Session   Activity Tolerance: Patient tolerated treatment well Patient left: in bed;with call bell/phone within reach Nurse Communication: Mobility status PT Visit Diagnosis: Unsteadiness on feet (R26.81);Muscle weakness (generalized) (M62.81);Difficulty in walking, not elsewhere classified (R26.2)     Time: 0981-1914 PT Time Calculation (min) (ACUTE ONLY): 13 min  Charges:  $Gait Training: 8-22 mins                     Giovany Cosby R. PTA Acute Rehabilitation Services Office: [phone]   Catalina Antigua 01/29/2023, 3:26 PM

## 2023-01-30 DIAGNOSIS — I509 Heart failure, unspecified: Secondary | ICD-10-CM | POA: Diagnosis not present

## 2023-01-30 DIAGNOSIS — H269 Unspecified cataract: Secondary | ICD-10-CM | POA: Diagnosis not present

## 2023-01-30 DIAGNOSIS — M6281 Muscle weakness (generalized): Secondary | ICD-10-CM | POA: Diagnosis not present

## 2023-01-30 DIAGNOSIS — I455 Other specified heart block: Secondary | ICD-10-CM | POA: Diagnosis not present

## 2023-01-30 DIAGNOSIS — R52 Pain, unspecified: Secondary | ICD-10-CM | POA: Diagnosis not present

## 2023-01-30 DIAGNOSIS — J984 Other disorders of lung: Secondary | ICD-10-CM | POA: Diagnosis not present

## 2023-01-30 DIAGNOSIS — I10 Essential (primary) hypertension: Secondary | ICD-10-CM | POA: Diagnosis not present

## 2023-01-30 DIAGNOSIS — J9611 Chronic respiratory failure with hypoxia: Secondary | ICD-10-CM | POA: Diagnosis not present

## 2023-01-30 DIAGNOSIS — J9819 Other pulmonary collapse: Secondary | ICD-10-CM | POA: Diagnosis not present

## 2023-01-30 DIAGNOSIS — J690 Pneumonitis due to inhalation of food and vomit: Secondary | ICD-10-CM | POA: Diagnosis not present

## 2023-01-30 DIAGNOSIS — F32A Depression, unspecified: Secondary | ICD-10-CM | POA: Diagnosis not present

## 2023-01-30 DIAGNOSIS — R195 Other fecal abnormalities: Secondary | ICD-10-CM | POA: Diagnosis not present

## 2023-01-30 DIAGNOSIS — I4901 Ventricular fibrillation: Secondary | ICD-10-CM | POA: Diagnosis not present

## 2023-01-30 DIAGNOSIS — K649 Unspecified hemorrhoids: Secondary | ICD-10-CM | POA: Diagnosis not present

## 2023-01-30 DIAGNOSIS — R6510 Systemic inflammatory response syndrome (SIRS) of non-infectious origin without acute organ dysfunction: Secondary | ICD-10-CM | POA: Diagnosis not present

## 2023-01-30 DIAGNOSIS — Z950 Presence of cardiac pacemaker: Secondary | ICD-10-CM | POA: Diagnosis not present

## 2023-01-30 DIAGNOSIS — E43 Unspecified severe protein-calorie malnutrition: Secondary | ICD-10-CM | POA: Diagnosis not present

## 2023-01-30 DIAGNOSIS — R531 Weakness: Secondary | ICD-10-CM | POA: Diagnosis not present

## 2023-01-30 DIAGNOSIS — D649 Anemia, unspecified: Secondary | ICD-10-CM | POA: Diagnosis not present

## 2023-01-30 DIAGNOSIS — R110 Nausea: Secondary | ICD-10-CM | POA: Diagnosis not present

## 2023-01-30 DIAGNOSIS — K219 Gastro-esophageal reflux disease without esophagitis: Secondary | ICD-10-CM | POA: Diagnosis not present

## 2023-01-30 DIAGNOSIS — Z431 Encounter for attention to gastrostomy: Secondary | ICD-10-CM | POA: Diagnosis not present

## 2023-01-30 DIAGNOSIS — E890 Postprocedural hypothyroidism: Secondary | ICD-10-CM | POA: Diagnosis not present

## 2023-01-30 DIAGNOSIS — R051 Acute cough: Secondary | ICD-10-CM | POA: Diagnosis not present

## 2023-01-30 DIAGNOSIS — R0603 Acute respiratory distress: Secondary | ICD-10-CM | POA: Diagnosis not present

## 2023-01-30 DIAGNOSIS — R197 Diarrhea, unspecified: Secondary | ICD-10-CM | POA: Diagnosis not present

## 2023-01-30 DIAGNOSIS — I5022 Chronic systolic (congestive) heart failure: Secondary | ICD-10-CM | POA: Diagnosis not present

## 2023-01-30 DIAGNOSIS — Z743 Need for continuous supervision: Secondary | ICD-10-CM | POA: Diagnosis not present

## 2023-01-30 DIAGNOSIS — I429 Cardiomyopathy, unspecified: Secondary | ICD-10-CM | POA: Diagnosis not present

## 2023-01-30 DIAGNOSIS — E039 Hypothyroidism, unspecified: Secondary | ICD-10-CM | POA: Diagnosis not present

## 2023-01-30 DIAGNOSIS — R1312 Dysphagia, oropharyngeal phase: Secondary | ICD-10-CM | POA: Diagnosis not present

## 2023-01-30 DIAGNOSIS — Z7401 Bed confinement status: Secondary | ICD-10-CM | POA: Diagnosis not present

## 2023-01-30 DIAGNOSIS — Z931 Gastrostomy status: Secondary | ICD-10-CM | POA: Diagnosis not present

## 2023-01-30 DIAGNOSIS — Z9981 Dependence on supplemental oxygen: Secondary | ICD-10-CM | POA: Diagnosis not present

## 2023-01-30 LAB — GLUCOSE, CAPILLARY
Glucose-Capillary: 119 mg/dL — ABNORMAL HIGH (ref 70–99)
Glucose-Capillary: 165 mg/dL — ABNORMAL HIGH (ref 70–99)
Glucose-Capillary: 94 mg/dL (ref 70–99)
Glucose-Capillary: 96 mg/dL (ref 70–99)

## 2023-01-30 LAB — CBC WITH DIFFERENTIAL/PLATELET
Abs Immature Granulocytes: 0.01 10*3/uL (ref 0.00–0.07)
Basophils Absolute: 0 10*3/uL (ref 0.0–0.1)
Basophils Relative: 0 %
Eosinophils Absolute: 0.3 10*3/uL (ref 0.0–0.5)
Eosinophils Relative: 4 %
HCT: 38.3 % (ref 36.0–46.0)
Hemoglobin: 12.1 g/dL (ref 12.0–15.0)
Immature Granulocytes: 0 %
Lymphocytes Relative: 44 %
Lymphs Abs: 3.2 10*3/uL (ref 0.7–4.0)
MCH: 30.5 pg (ref 26.0–34.0)
MCHC: 31.6 g/dL (ref 30.0–36.0)
MCV: 96.5 fL (ref 80.0–100.0)
Monocytes Absolute: 0.6 10*3/uL (ref 0.1–1.0)
Monocytes Relative: 9 %
Neutro Abs: 3.1 10*3/uL (ref 1.7–7.7)
Neutrophils Relative %: 43 %
Platelets: 282 10*3/uL (ref 150–400)
RBC: 3.97 MIL/uL (ref 3.87–5.11)
RDW: 15.8 % — ABNORMAL HIGH (ref 11.5–15.5)
WBC: 7.2 10*3/uL (ref 4.0–10.5)
nRBC: 0 % (ref 0.0–0.2)

## 2023-01-30 LAB — MAGNESIUM: Magnesium: 2.1 mg/dL (ref 1.7–2.4)

## 2023-01-30 LAB — BASIC METABOLIC PANEL
Anion gap: 10 (ref 5–15)
BUN: 15 mg/dL (ref 6–20)
CO2: 28 mmol/L (ref 22–32)
Calcium: 8.6 mg/dL — ABNORMAL LOW (ref 8.9–10.3)
Chloride: 99 mmol/L (ref 98–111)
Creatinine, Ser: 0.52 mg/dL (ref 0.44–1.00)
GFR, Estimated: >60 mL/min (ref >60)
Glucose, Bld: 117 mg/dL — ABNORMAL HIGH (ref 70–99)
Potassium: 3.8 mmol/L (ref 3.5–5.1)
Sodium: 137 mmol/L (ref 135–145)

## 2023-01-30 MED ORDER — FREE WATER: 150.0000 mL | Freq: Three times a day (TID) | Status: DC

## 2023-01-30 MED ORDER — JEVITY 1.5 CAL/FIBER PO LIQD: ORAL | Status: DC

## 2023-01-30 MED ORDER — LOPERAMIDE HCL 2 MG PO CAPS: 2.0000 mg | ORAL_CAPSULE | Freq: Four times a day (QID) | ORAL | 0 refills | Status: DC | PRN

## 2023-01-30 NOTE — Discharge Summary (Addendum)
[name] [name]:[id number] DOB: 04-Dec-1970 DOA: 01/22/2023  PCP: Shayne Alken, MD  Admit date: 01/22/2023  Discharge date: 01/30/2023  Admitted From: Home   Disposition:  SNF   Recommendations for Outpatient Follow-up:   Follow up with PCP in 1-2 weeks  PCP Please obtain BMP/CBC, 2 view CXR in 1week,  (see Discharge instructions)   PCP Please follow up on the following pending results:    Home Health: None   Equipment/Devices: None  Consultations: None  Discharge Condition: Stable    CODE STATUS: Full    Diet Recommendation: By mouth clear liquid diet sitting up for comfort only, rest of the feeding and medications all via PEG tube  Chief Complaint  Patient presents with   Weakness   Diarrhea     Brief history of present illness from the day of admission and additional interim summary    52 y.o. female with medical history significant of myotonic dystrophy, hypothyroidism, anxiety, heart block with pacer, HFrEF, and admission in 11/2022 for pneumonia with hypoxic respiratory failure failure that was complicated by VF arrest, was recently [facility], she was discharged from the [facility] on 01/19/2023.  She now presents with ongoing cough, diarrhea and low-grade fevers.  Diagnosed with aspiration pneumonia and admitted to the hospital, patient claims that prior to her discharge she had few episodes of nausea and vomiting at the [facility] facility.                                                                  Hospital Course   Aspiration pneumonia.  No sepsis.  Had some emesis prior to her discharge from the [facility] facility and likely aspirated due to that, much improved on empiric antibiotic, finishing antibiotics today on 01/30/2023,  continue with clear liquids for comfort by mouth along with medications and diet supplements/tube feeds via PEG tube .  Speech seen and cleared the patient this admission for clear liquids only by mouth, encouraged to sit in chair use I-S and flutter valve.  Negative cultures.  Clinically much improved and close to her baseline.   Chronic hypoxic respiratory failure.  On baseline 2 L nasal cannula.     Diarrhea.  Due to tube feeds, rate adjusted, Imodium as needed added, GI pathogen panel stable, tube feed rate reduced 01/26/23 >> diarrhea completely resolved.   History of chronic systolic heart failure EF 20% due to nonischemic cardiomyopathy. History of V-fib arrest - March 2024.  Follows with Dr. Marca Ancona.  Currently compensated.  She is on digoxin at baseline which will be continued.  V-fib arrest was thought to be due to reversible causes by the cardiology team hence defibrillator was not pursued.  On Aldactone, low-dose beta-blocker as tolerated by blood  pressure, blood pressure too low for ARB, ACE or Entresto.   High-grade heart block.  Has pacemaker.     History of myotonic dystrophy.  Generalized weakness, dysphagia, previous history of trach now decannulated with healed stoma.     Dysphagia -  with PEG tube feed.     Anxiety.  On Seroquel.   Hypothyroidism.  Continue Synthroid.  Stable TSH.   GERD.  On PPI.   Possible artifact versus foreign body in the GI tract noted on CT scan in the ER.  Discussed with GI Dr. Adela Lank subsequent x-ray stable.  No further workup.    Discharge diagnosis     Principal Problem:   SIRS (systemic inflammatory response syndrome) (HCC) Active Problems:   Diarrhea   Chronic respiratory failure with hypoxia (HCC)   Cardiac pacemaker in situ   Chronic systolic CHF (congestive heart failure) (HCC)   Dysphagia   Myotonic dystrophy (HCC)   Generalized anxiety disorder   Hypothyroidism   Protein-calorie malnutrition, severe    Discharge  instructions    Discharge Instructions     Discharge instructions   Complete by: As directed    Follow with Primary MD Shayne Alken, MD in 7 days   Get CBC, CMP, 2 view Chest X ray -  checked next visit with your primary MD or SNF MD   Activity: As tolerated with Full fall precautions use walker/cane & assistance as needed  Disposition SNF  Diet: Clear liquid diet by mouth for comfort with feeding assistance and aspiration precautions.  All other diet and medications via PEG tube  Special Instructions: If you have smoked or chewed Tobacco  in the last 2 yrs please stop smoking, stop any regular Alcohol  and or any Recreational drug use.  On your next visit with your primary care physician please Get Medicines reviewed and adjusted.  Please request your Prim.MD to go over all Hospital Tests and Procedure/Radiological results at the follow up, please get all Hospital records sent to your Prim MD by signing hospital release before you go home.  If you experience worsening of your admission symptoms, develop shortness of breath, life threatening emergency, suicidal or homicidal thoughts you must seek medical attention immediately by calling 911 or calling your MD immediately  if symptoms less severe.  You Must read complete instructions/literature along with all the possible adverse reactions/side effects for all the Medicines you take and that have been prescribed to you. Take any new Medicines after you have completely understood and accpet all the possible adverse reactions/side effects.   Increase activity slowly   Complete by: As directed        Discharge Medications   Allergies as of 01/30/2023       Reactions   Penicillin G Other (See Comments), Rash, Nausea And Vomiting   Sore throat   Penicillins Nausea And Vomiting, Rash, Other (See Comments)   Fever Has patient had a PCN reaction causing immediate rash, facial/tongue/throat swelling, SOB or lightheadedness  with hypotension: Yes Has patient had a PCN reaction causing severe rash involving mucus membranes or skin necrosis: Unknown Has patient had a PCN reaction that required hospitalization: No Has patient had a PCN reaction occurring within the last 10 years: No If all of the above answers are "NO", then may proceed with Cephalosporin use.        Medication List     STOP taking these medications    clonazePAM 1 MG tablet Commonly known as: Scarlette Calico  sertraline 25 MG tablet Commonly known as: ZOLOFT       TAKE these medications    acetaminophen 500 MG tablet Commonly known as: TYLENOL Take 500 mg by mouth every 6 (six) hours as needed for mild pain.   digoxin 0.25 MG tablet Commonly known as: LANOXIN Place 0.25 mg into feeding tube daily.   feeding supplement (JEVITY 1.5 CAL/FIBER) Liqd Bolus Feeds: 237cc - Jevity 1.5  - Administer slowly, do not push fast - 30 mL free water flush before and after each bolus - Keep pt upright for 30-60 minutes after each bolus What changed:  how much to take how to take this when to take this additional instructions   feeding supplement (PROSource TF20) liquid Place 60 mLs into feeding tube daily.   free water Soln Place 150 mLs into feeding tube 3 (three) times daily.   guaifenesin 400 MG Tabs tablet Commonly known as: HUMIBID E Take 400 mg by mouth 3 (three) times daily.   levothyroxine 112 MCG tablet Commonly known as: SYNTHROID Place 1 tablet (112 mcg total) into feeding tube daily before breakfast.   loperamide 2 MG capsule Commonly known as: IMODIUM Take 1 capsule (2 mg total) by mouth every 6 (six) hours as needed for diarrhea or loose stools.   metoCLOPramide 5 MG tablet Commonly known as: REGLAN Place 5 mg into feeding tube.   metoprolol tartrate 25 MG tablet Commonly known as: LOPRESSOR Place 12.5 mg into feeding tube 2 (two) times daily.   pantoprazole 40 MG tablet Commonly known as: PROTONIX Take 40 mg  by mouth daily.   QUEtiapine 25 MG tablet Commonly known as: SEROQUEL Place 1 tablet (25 mg total) into feeding tube at bedtime.   scopolamine 1 MG/3DAYS Commonly known as: TRANSDERM-SCOP Place 1 patch onto the skin every 3 (three) days.   spironolactone 25 MG tablet Commonly known as: ALDACTONE Place 0.5 tablets (12.5 mg total) into feeding tube daily.         Contact information for follow-up providers     Shayne Alken, MD. Schedule an appointment as soon as possible for a visit in 1 week(s).   Contact information: [address] [place] [address] [phone]              Contact information for after-discharge care     Destination     [facility] Preferred SNF .   Service: Skilled Nursing Contact information: [address] [place] [address] [phone]                     Major procedures and Radiology Reports - PLEASE review detailed and final reports thoroughly  -      DG Chest [place] 1 View  Result Date: 01/27/2023 CLINICAL DATA:  Shortness of breath. EXAM: PORTABLE CHEST 1 VIEW COMPARISON:  Radiograph 01/24/2023. Lung bases from abdominal CT 01/22/2021 FINDINGS: Left-sided pacemaker in stable positioning. Unchanged heart size and mediastinal contours. Bilateral lower lobe collapse, with slightly improved aeration in the periphery of the right lower lobe from prior. There may be a small right pleural effusion. No pulmonary edema or pneumothorax. IMPRESSION: Bilateral lower lobe collapse, with slightly improved aeration in the periphery of the right lower lobe from prior. Possible small right pleural effusion. Electronically Signed   By: Narda Rutherford M.D.   On: 01/27/2023 14:48   DG Swallowing Func-Speech Pathology  Result Date: 01/25/2023 Table formatting from the original result was not included. Modified Barium Swallow  Study Patient Details Name: Cynthia Underwood MRN: [id number] Date of Birth:  28-Dec-1970 Today's Date: 01/25/2023 HPI/PMH: HPI: Patient is a 52 y.o. female with PMH: myotonic dystrophy, chronic dysphagia, hypothyroidism, total thyroidectomy 2022, anxiety, heart block with pacer, HFrEF. She was recently admitted in March 2024 for PNA and hypoxic respiratory failure complicated by VF arrest, requiring intubation then trach placement. She was [facility] from [facility] to [facility] where she had PEG placed due to severe dysphagia, was decannulated and ultimately dischaged home on 01/19/23. She presented to the hospital for current admission, on 01/23/23 due to ongoing cough, diarrhea and low grade fevers. She has long-standing hx of dysphagia that was first identified when she was living in [place]. 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  She had worsening of dysphagia and in 10/2020 was found to have a mulitnodular thyroid goiter. CT cited mild to moderate mass effect upon the esophagus.  Esophagram 10/21/20: "Marked difficulty in initiating swallowing with delayed oropharyngeal phase and large amount of residue in the piriform sinuses and vallecula. No frank aspiration seen. Mild narrowing in the lower cervical esophagus, likely related to thyroid goiter."  She underwent a total thyroidectomy 04/24/21.  She had an MBS while admitted to [facility] which revealed severe dysphagia. She currently takes only ice and small sips of water by mouth; otherwise all nutrition via G-tube. Clinical Impression: Clinical Impression: Pt presents with a severe dysphagia that is primarily related to her dx of myotonic dystrophy type 1 with potential secondary effects from her 2022 total thyroidectomy. The swallow is impacted by nearly absent pharyngeal stripping wave and minimal base-of-tongue retraction.  There is reduced laryngeal elevation.  These factors lead to minimal movement of POs through the pharynx.  She was able to protect her airway surprisingly well, with only high/transient penetration of  thin liquids into the laryngeal vestibule, no aspiration, but repetitive effort and subswallows necessary in order to transfer small portions of a single bolus through her UES.  There was almost total stasis of purees in pharynx, requiring oral suctioning and great effort to expectorate/remove barium.  Continue NPO andTF as primary source of nutrition; allow small sips of water/ice chips per pt's preferences. With therapy, she may be able to drink larger volumes of thin liquid in the future.  SLP will follow while admiited; she will need intensive therapy  after discharge. Factors that may increase risk of adverse event in presence of aspiration Rubye Oaks & Clearance Coots 2021): Factors that may increase risk of adverse event in presence of aspiration Rubye Oaks & Clearance Coots 2021): Frail or deconditioned; Weak cough; Limited mobility Recommendations/Plan: Swallowing Evaluation Recommendations Swallowing Evaluation Recommendations Recommendations: NPO; Ice chips PRN after oral care Medication Administration: Via alternative means Supervision: Patient able to self-feed Oral care recommendations: Oral care QID (4x/day); Oral care before ice chips/water Treatment Plan Treatment Plan Treatment recommendations: Therapy as outlined in treatment plan below Follow-up recommendations: Other (comment) (tba) Treatment frequency: Min 2x/week Treatment duration: 2 weeks Recommendations Recommendations for follow up therapy are one component of a multi-disciplinary discharge planning process, led by the attending physician.  Recommendations may be updated based on patient status, additional functional criteria and insurance authorization. Assessment: Orofacial Exam: Orofacial Exam Oral Cavity: Oral Hygiene: WFL Oral Cavity - Dentition: Adequate natural dentition Oral Motor/Sensory Function: Suspected cranial nerve impairment (bilateral buccinator weakness per neurology assessment as OP) Anatomy: Anatomy: Other (Comment) (surgical clips from  thyroidectomy visible) Boluses Administered: Boluses Administered Boluses Administered: Thin liquids (Level  0); Puree  Oral Impairment Domain: Oral Impairment Domain Lip Closure: No labial escape Tongue control during bolus hold: Escape to lateral buccal cavity/floor of mouth Oral residue: Trace residue lining oral structures Location of oral residue : Tongue Initiation of pharyngeal swallow : Posterior laryngeal surface of the epiglottis  Pharyngeal Impairment Domain: Pharyngeal Impairment Domain Soft palate elevation: No bolus between soft palate (SP)/pharyngeal wall (PW) Laryngeal elevation: Minimal superior movement of thyroid cartilage with minimal approximation of arytenoids to epiglottic petiole Anterior hyoid excursion: Partial anterior movement Epiglottic movement: Partial inversion Laryngeal vestibule closure: Incomplete, narrow column air/contrast in laryngeal vestibule Pharyngeal stripping wave : Absent Pharyngeal contraction (A/P view only): N/A Pharyngoesophageal segment opening: Minimal distention/minimal duration, marked obstruction of flow Tongue base retraction: Wide column of contrast or air between tongue base and PPW Pharyngeal residue: Majority of contrast within or on pharyngeal structures Location of pharyngeal residue: Valleculae; Pyriform sinuses  Esophageal Impairment Domain: Esophageal Impairment Domain Esophageal clearance upright position: -- (not viewed) Pill: Esophageal Impairment Domain Esophageal clearance upright position: -- (not viewed) Penetration/Aspiration Scale Score: Penetration/Aspiration Scale Score 2.  Material enters airway, remains ABOVE vocal cords then ejected out: Thin liquids (Level 0) Compensatory Strategies: Compensatory Strategies Compensatory strategies: Yes Effortful swallow: Ineffective Chin tuck: Ineffective Left head turn: Ineffective Ineffective Left Head Turn: Thin liquid (Level 0) Right head turn: Ineffective Ineffective Right Head Turn: Thin liquid  (Level 0)   General Information: Caregiver present: No  Diet Prior to this Study: NPO; G-tube   Temperature : Normal   No data recorded  Supplemental O2: Nasal cannula   History of Recent Intubation: No  Behavior/Cognition: Alert; Cooperative No data recorded Baseline vocal quality/speech: Dysphonic Volitional Cough: Able to elicit Volitional Swallow: Able to elicit No data recorded Goal Planning: Prognosis for improved oropharyngeal function: Good No data recorded No data recorded Patient/Family Stated Goal: frustrated by only being able to have ice chips No data recorded Pain: Pain Assessment Pain Assessment: Faces Faces Pain Scale: 0 Breathing: 0 Negative Vocalization: 0 Facial Expression: 1 Body Language: 1 Consolability: 1 PAINAD Score: 3 Facial Expression: 0 Body Movements: 0 Muscle Tension: 0 Compliance with ventilator (intubated pts.): 0 Vocalization (extubated pts.): N/A CPOT Total: 0 Pain Location: chest wall, due to coughing Pain Descriptors / Indicators: Grimacing; Discomfort Pain Intervention(s): Limited activity within patient's tolerance; Monitored during session; Repositioned End of Session: Start Time:SLP Start Time (ACUTE ONLY): 1353 Stop Time: SLP Stop Time (ACUTE ONLY): 1425 Time Calculation:SLP Time Calculation (min) (ACUTE ONLY): 32 min Charges: SLP Evaluations $ SLP Speech Visit: 1 Visit SLP Evaluations $BSS Swallow: 1 Procedure $MBS Swallow: 1 Procedure $ SLP Eval Voice Prosthetic Device: Procedure $$ Passy Muir Speaking Valve: yes $Speech Treatment for Individual: 1 Procedure SLP visit diagnosis: SLP Visit Diagnosis: Dysphagia, oropharyngeal phase (R13.12) Past Medical History: Past Medical History: Diagnosis Date  Bifascicular block 10/02/2014  Cataract   Dyspnea   Excess ear wax   Multinodular goiter   Pneumonia   Presence of permanent cardiac pacemaker   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  Sickle cell trait (HCC)   Watery eyes   Left  Weakness  of both legs  Past Surgical History: Past Surgical History: Procedure Laterality Date  BREAST BIOPSY Right   EYE SURGERY Left 2017  "related to blockage in my nose"  EYE SURGERY Right 05/2019  INSERT / REPLACE / REMOVE PACEMAKER  02/15/2017  IR GASTROSTOMY TUBE MOD SED  01/01/2023  PACEMAKER IMPLANT N/A 02/15/2017  Procedure: Pacemaker Implant;  Surgeon: Duke Salvia, MD;  Location: [facility] INVASIVE CV LAB;  Service: Cardiovascular;  Laterality: N/A;  RIGHT/LEFT HEART CATH AND CORONARY ANGIOGRAPHY N/A 11/16/2022  Procedure: RIGHT/LEFT HEART CATH AND CORONARY ANGIOGRAPHY;  Surgeon: Laurey Morale, MD;  Location: [facility] INVASIVE CV LAB;  Service: Cardiovascular;  Laterality: N/A;  THYROIDECTOMY N/A 04/21/2021  Procedure: TOTAL THYROIDECTOMY;  Surgeon: Axel Filler, MD;  Location: [facility] OR;  Service: General;  Laterality: N/A; Carolan Shiver 01/25/2023, 3:40 PM Amanda L. Samson Frederic, MA CCC/SLP Clinical Specialist - Acute Care SLP Acute Rehabilitation Services Office number [phone]  DG Abd 1 View  Result Date: 01/24/2023 CLINICAL DATA:  Foreign body. EXAM: ABDOMEN - 1 VIEW COMPARISON:  Jan 05, 2023. FINDINGS: The bowel gas pattern is normal. Gastrostomy tube is seen well positioned over gastric air shadow. No radio-opaque calculi or other significant radiographic abnormality are seen. IMPRESSION: Gastrostomy tube is noted.  No abnormal bowel dilatation. Electronically Signed   By: Lupita Raider M.D.   On: 01/24/2023 13:05   DG Chest Port 1 View  Result Date: 01/24/2023 CLINICAL DATA:  Shortness of breath. EXAM: PORTABLE CHEST 1 VIEW COMPARISON:  01/22/2023 FINDINGS: Persistent right pleural effusion and bibasilar infiltrates. Stable pacer wires.  Stable surgical changes in the neck. IMPRESSION: Persistent right pleural effusion and bibasilar infiltrates. Electronically Signed   By: Rudie Meyer M.D.   On: 01/24/2023 07:08   CT ABDOMEN PELVIS W CONTRAST  Result Date: 01/23/2023 CLINICAL DATA:  Abdominal  pain, acute, nonlocalized, diarrhea, hypotension EXAM: CT ABDOMEN AND PELVIS WITH CONTRAST TECHNIQUE: Multidetector CT imaging of the abdomen and pelvis was performed using the standard protocol following bolus administration of intravenous contrast. RADIATION DOSE REDUCTION: This exam was performed according to the departmental dose-optimization program which includes automated exposure control, adjustment of the mA and/or kV according to patient size and/or use of iterative reconstruction technique. CONTRAST:  75mL OMNIPAQUE IOHEXOL 350 MG/ML SOLN COMPARISON:  12/26/2022, CT chest 11/11/2022 FINDINGS: Lower chest: There is persistent dense collapse and consolidation of the lower lobes bilaterally with marked volume loss. Again noted is bronchiectasis and multifocal airway impaction within the collapsed segments. While this appears stable since prior examination, this is new since remote prior examination of 11/11/2022. Pacemaker leads noted within the right heart. Cardiac size within normal limits. Hepatobiliary: No focal liver abnormality is seen. No gallstones, gallbladder wall thickening, or biliary dilatation. Pancreas: Unremarkable Spleen: Unremarkable Adrenals/Urinary Tract: The adrenal glands are unremarkable. The kidneys are normal in size and position. Multiple simple cortical cysts are seen within the kidneys bilaterally. No follow-up imaging is recommended for these lesions. The kidneys are otherwise unremarkable. The bladder is unremarkable. Stomach/Bowel: Pull-through type gastrostomy catheter in appropriate position within the mid to distal body of the stomach. A 2 cm linear metallic density is seen intraluminally within the bowel within the right hemipelvis. There is no surrounding inflammatory stranding or loculated fluid collection to suggest perforation. The stomach, small bowel, and large bowel are otherwise unremarkable. Appendix normal. No free intraperitoneal gas or fluid. Vascular/Lymphatic:  No significant vascular findings are present. No enlarged abdominal or pelvic lymph nodes. Reproductive: Uterus and bilateral adnexa are unremarkable. Other: No abdominal wall hernia Musculoskeletal: Bilateral L5 pars defects are present without associated spondylolisthesis. No acute bone abnormality. No lytic or blastic bone lesion. IMPRESSION: 1. No acute intra-abdominal pathology identified. No definite radiographic explanation for the patient's reported symptoms. 2. Pull-through type gastrostomy catheter in appropriate position. 3. 2 cm linear metallic  density intraluminally within the bowel within the right hemipelvis. This may represent an ingested foreign body. No surrounding inflammatory stranding or loculated fluid collection to suggest perforation. 4. Persistent dense collapse and consolidation of the lower lobes bilaterally with marked volume loss. Again noted is bronchiectasis and multifocal airway impaction within the collapsed segments. While this appears stable since prior examination, this is new since remote prior examination of 11/11/2022. Electronically Signed   By: Helyn Numbers M.D.   On: 01/23/2023 03:06   DG Chest 2 View  Result Date: 01/22/2023 CLINICAL DATA:  Weakness cough EXAM: CHEST - 2 VIEW COMPARISON:  01/05/2023, CT 11/11/2022, radiograph 01/12/2023 FINDINGS: Suspicion of right pleural effusion. Worsening bilateral lung base airspace disease compared to prior. Stable cardiomediastinal silhouette. No pneumothorax.Left-sided pacing device as before. IMPRESSION: Suspect small right pleural effusion. Worsening bilateral lung base airspace disease compared to prior Electronically Signed   By: Jasmine Pang M.D.   On: 01/22/2023 23:31   DG CHEST PORT 1 VIEW  Result Date: 01/12/2023 CLINICAL DATA:  Cough EXAM: PORTABLE CHEST 1 VIEW COMPARISON:  Portable exam 1134 hours compared to 01/05/2023 FINDINGS: LEFT subclavian sequential pacemaker with leads projecting over RIGHT atrium and  RIGHT ventricle. Normal heart size and pulmonary vascularity. Atelectasis of RIGHT middle and RIGHT lower lobes again identified. Minimal subsegmental atelectasis LEFT base. Remaining lungs clear. No pleural effusion or pneumothorax. IMPRESSION: Atelectasis of RIGHT middle and RIGHT lower lobes. Minimal LEFT basilar atelectasis. Electronically Signed   By: Ulyses Southward M.D.   On: 01/12/2023 11:52   DG Chest Port 1 View  Result Date: 01/05/2023 CLINICAL DATA:  Pneumonia EXAM: PORTABLE CHEST 1 VIEW COMPARISON:  Chest x-ray dated December 28, 2022 FINDINGS: Interval removal of feeding tube. Tracheostomy tube in place. Left chest wall dual lead pacer with unchanged lead position. Cardiac and mediastinal contours are within normal limits. Unchanged left-greater-than-right basilar opacities. No large pleural effusion or evidence of pneumothorax. IMPRESSION: 1. Interval removal of feeding tube. 2. Unchanged left-greater-than-right basilar opacities, likely due to lower lobe atelectasis. Electronically Signed   By: Allegra Lai M.D.   On: 01/05/2023 19:32   DG ABDOMEN PEG TUBE LOCATION  Result Date: 01/05/2023 CLINICAL DATA:  Ileus, small bowel obstruction EXAM: ABDOMEN - 1 VIEW COMPARISON:  CT 12/26/2022.  Plain films 12/29/2022. FINDINGS: Gastrostomy tube projects over the stomach. Contrast seen within the stomach. No contrast extravasation. Non dilated large and small bowel. Gas throughout the colon. No organomegaly or free air. Linear density projects over the right lower quadrant/pelvis. This was not present on prior plain film or CT. This could be external to the patient. IMPRESSION: No evidence of bowel obstruction or ileus. No contrast extravasation from the G-tube or stomach. Linear density projects in the right lower quadrant/pelvis, new since prior imaging. This could be external to the patient. Recommend clinical correlation. Electronically Signed   By: Charlett Nose M.D.   On: 01/05/2023 19:30   IR  GASTROSTOMY TUBE MOD SED  Result Date: 01/01/2023 INDICATION: 52 year old female referred for gastrostomy tube placement EXAM: PERC PLACEMENT GASTROSTOMY MEDICATIONS: 1 g vancomycin; Antibiotics were administered within 1 hour of the procedure. ANESTHESIA/SEDATION: Versed 0.5 mg IV; Fentanyl 25 mcg IV Moderate Sedation Time:  10 minutes The patient was continuously monitored during the procedure by the interventional radiology nurse under my direct supervision. CONTRAST:  15mL OMNIPAQUE IOHEXOL 300 MG/ML SOLN - administered into the gastric lumen. FLUOROSCOPY: Radiation Exposure Index (as provided by the fluoroscopic device): 3 mGy Kerma COMPLICATIONS: None  PROCEDURE: Informed written consent was obtained from the patient and the patient's family after a thorough discussion of the procedural risks, benefits and alternatives. All questions were addressed. Maximal Sterile Barrier Technique was utilized including caps, mask, sterile gowns, sterile gloves, sterile drape, hand hygiene and skin antiseptic. A timeout was performed prior to the initiation of the procedure. The epigastrium was prepped with Betadine in a sterile fashion, and a sterile drape was applied covering the operative field. A sterile gown and sterile gloves were used for the procedure. A 5-French orogastric tube is placed under fluoroscopic guidance. Scout imaging of the abdomen confirms barium within the transverse colon. The stomach was distended with gas. Under fluoroscopic guidance, an 18 gauge needle was utilized to puncture the anterior wall of the body of the stomach. An Amplatz wire was advanced through the needle passing a T fastener into the lumen of the stomach. The T fastener was secured for gastropexy. A 9-French sheath was inserted. A snare was advanced through the 9-French sheath. A Teena Dunk was advanced through the orogastric tube. It was snared then pulled out the oral cavity, pulling the snare, as well. The leading edge of the  gastrostomy was attached to the snare. It was then pulled down the esophagus and out the percutaneous site. Tube secured in place. Contrast was injected. Patient tolerated the procedure well and remained hemodynamically stable throughout. No complications were encountered and no significant blood loss encountered. IMPRESSION: Status post fluoroscopic placed percutaneous gastrostomy tube, with 20 [place] pull-through. Signed, Yvone Neu. Miachel Roux, RPVI [facility] Electronically Signed   By: Gilmer Mor D.O.   On: 01/01/2023 12:09    Micro Results    Recent Results (from the past 240 hour(s))  Blood culture (routine x 2)     Status: None   Collection Time: 01/22/23 11:31 PM   Specimen: BLOOD  Result Value Ref Range Status   Specimen Description BLOOD BLOOD LEFT WRIST  Final   Special Requests   Final    BOTTLES DRAWN AEROBIC AND ANAEROBIC Blood Culture results may not be optimal due to an inadequate volume of blood received in culture bottles   Culture   Final    NO GROWTH 5 DAYS Performed at [facility], [address], [place], [place] [address]    Report Status 01/27/2023 FINAL  Final  Gastrointestinal Panel by PCR , Stool     Status: None   Collection Time: 01/22/23 11:31 PM   Specimen: STOOL  Result Value Ref Range Status   Campylobacter species NOT DETECTED NOT DETECTED Final   Plesimonas shigelloides NOT DETECTED NOT DETECTED Final   Salmonella species NOT DETECTED NOT DETECTED Final   Yersinia enterocolitica NOT DETECTED NOT DETECTED Final   Vibrio species NOT DETECTED NOT DETECTED Final   Vibrio cholerae NOT DETECTED NOT DETECTED Final   Enteroaggregative E coli (EAEC) NOT DETECTED NOT DETECTED Final   Enteropathogenic E coli (EPEC) NOT DETECTED NOT DETECTED Final   Enterotoxigenic E coli (ETEC) NOT DETECTED NOT DETECTED Final   Shiga like toxin producing E coli (STEC) NOT DETECTED NOT DETECTED Final    Shigella/Enteroinvasive E coli (EIEC) NOT DETECTED NOT DETECTED Final   Cryptosporidium NOT DETECTED NOT DETECTED Final   Cyclospora cayetanensis NOT DETECTED NOT DETECTED Final   Entamoeba histolytica NOT DETECTED NOT DETECTED Final   Giardia lamblia NOT DETECTED NOT DETECTED Final   Adenovirus F40/41 NOT DETECTED NOT DETECTED Final   Astrovirus NOT  DETECTED NOT DETECTED Final   Norovirus GI/GII NOT DETECTED NOT DETECTED Final   Rotavirus A NOT DETECTED NOT DETECTED Final   Sapovirus (I, II, IV, and V) NOT DETECTED NOT DETECTED Final    Comment: Performed at [facility], [address] [place], [place] [address]  C Difficile Quick Screen w PCR reflex     Status: None   Collection Time: 01/22/23 11:31 PM   Specimen: STOOL  Result Value Ref Range Status   C Diff antigen NEGATIVE NEGATIVE Final   C Diff toxin NEGATIVE NEGATIVE Final   C Diff interpretation No C. difficile detected.  Final    Comment: Performed at [facility], [address] [place], [place] [address]  Expectorated Sputum Assessment w Gram Stain, Rflx to Resp Cult     Status: None   Collection Time: 01/23/23  2:20 PM   Specimen: Expectorated Sputum  Result Value Ref Range Status   Specimen Description EXPECTORATED SPUTUM  Final   Special Requests NONE  Final   Sputum evaluation   Final    Sputum specimen not acceptable for testing.  Please recollect.   NOTIFIED RN [facility] TAYLOR  ON 01/23/23 @ 2033 BY DRT Performed at [facility], [address], [place], [place] [address]    Report Status 01/23/2023 FINAL  Final  Blood culture (routine x 2)     Status: None   Collection Time: 01/23/23  2:52 PM   Specimen: BLOOD RIGHT HAND  Result Value Ref Range Status   Specimen Description BLOOD RIGHT HAND  Final   Special Requests   Final    BOTTLES DRAWN AEROBIC ONLY Blood Culture results may not be optimal due to an inadequate volume of blood received in culture bottles   Culture   Final    NO GROWTH  5 DAYS Performed at [facility], [address] [place], [place] [address]    Report Status 01/28/2023 FINAL  Final    Today   Subjective    Laquanna Kasprzyk today has no headache,no chest abdominal pain,no new weakness tingling or numbness, feels much better wants to go home today.     Objective   Blood pressure 95/69, pulse 72, temperature 97.7 F (36.5 C), temperature source Oral, resp. rate 17, weight 45.4 kg, last menstrual period 12/03/2016, SpO2 94 %.   Intake/Output Summary (Last 24 hours) at 01/30/2023 0840 Last data filed at 01/29/2023 2000 Gross per 24 hour  Intake 510 ml  Output --  Net 510 ml    Exam  Awake Alert, No new F.N deficits,    [place].AT,PERRAL Supple Neck,   Symmetrical Chest wall movement, Moderate air movement bilaterally, CTAB RRR,No Gallops,   +ve B.Sounds, Abd Soft, Non tender, PEG tube No Cyanosis, Clubbing or edema    Data Review   Recent Labs  Lab 01/24/23 0415 01/25/23 0402 01/26/23 0408 01/27/23 0237 01/30/23 0214  WBC 7.9 5.2 5.2 5.9 7.2  HGB 9.7* 10.6* 11.2* 10.8* 12.1  HCT 30.7* 34.3* 35.5* 34.6* 38.3  PLT 206 231 239 243 282  MCV 97.5 98.3 98.3 98.9 96.5  MCH 30.8 30.4 31.0 30.9 30.5  MCHC 31.6 30.9 31.5 31.2 31.6  RDW 16.1* 16.2* 15.9* 15.8* 15.8*  LYMPHSABS  --  2.0 2.2 2.4 3.2  MONOABS  --  0.4 0.4 0.4 0.6  EOSABS  --  0.1 0.2 0.2 0.3  BASOSABS  --  0.0 0.0 0.0 0.0    Recent Labs  Lab 01/24/23 0415 01/24/23 0645 01/25/23 0402 01/26/23 0408 01/27/23 0237 01/30/23 0214  NA 138  --  141 141 142 137  K 3.4*  --  3.8 3.6 3.8 3.8  CL 103  --  105 103 103 99  CO2 28  --  29 30 32 28  ANIONGAP 7  --  7 8 7 10   GLUCOSE 94  --  98 86 103* 117*  BUN 10  --  12 10 11 15   CREATININE 0.48  --  0.45 0.45 0.44 0.52  CRP  --  11.1* 4.2* 1.9* 1.2*  --   PROCALCITON  --  <0.10 <0.10 <0.10 <0.10  --   BNP  --  54.9 40.1 29.4 39.2  --   MG  --  2.3 2.1 2.1 2.0 2.1  CALCIUM 8.6*  --  8.8* 9.1 9.1 8.6*    Total Time  in preparing paper work, data evaluation and todays exam - 35 minutes  Signature  -    Susa Raring M.D on 01/30/2023 at 8:40 AM   -  To page go to [email or web address]

## 2023-01-30 NOTE — TOC Transition Note (Signed)
Transition of Care [facility]) - CM/SW Discharge Note   Patient Details  Name: Cynthia Underwood MRN: [id number] Date of Birth: 1970/09/17  [facility]) CM/SW Contact:  Mearl Latin, LCSW Phone Number: 01/30/2023, 1:06 PM   Clinical Narrative:    Patient will DC to: [facility] Anticipated DC date: 01/30/23 Family notified: Sister Transport by: Sharin Mons   Per MD patient ready for DC to [facility]. RN to call report prior to discharge [phone]). RN, patient, patient's family, and facility notified of DC. Discharge Summary and FL2 sent to facility. DC packet on chart. Ambulance transport requested for patient.   CSW will sign off for now as social work intervention is no longer needed. Please consult [place] again if new needs arise.     Final next level of care: Skilled Nursing Facility Barriers to Discharge: Barriers Resolved   Patient Goals and CMS Choice CMS Medicare.gov Compare Post Acute Care list provided to:: Patient Choice offered to / list presented to : Patient, Parent  Discharge Placement     Existing PASRR number confirmed : 01/30/23          Patient [facility] bed at: [facility] Patient to be transferred to facility by: [facility] Name of family member notified: Sister Patient and family notified of of transfer: 01/30/23  Discharge Plan and Services Additional resources added to the After Visit Summary for   In-house Referral: Clinical Social Work   Post Acute Care Choice: Home Health, Skilled Nursing Facility                               Social Determinants of Health (SDOH) Interventions SDOH Screenings   Food Insecurity: No Food Insecurity (01/23/2023)  Housing: Low Risk  (01/23/2023)  Transportation Needs: No Transportation Needs (01/23/2023)  Utilities: Not At Risk (01/23/2023)  Alcohol Screen: Low Risk  (10/16/2022)  Depression (PHQ2-9): Medium Risk (11/08/2022)  Financial Resource Strain: Low Risk  (10/16/2022)   Physical Activity: Inactive (10/16/2022)  Social Connections: Socially Isolated (10/16/2022)  Stress: Stress Concern Present (10/16/2022)  Tobacco Use: High Risk (01/23/2023)     Readmission Risk Interventions     No data to display

## 2023-01-30 NOTE — Discharge Instructions (Signed)
Follow with Primary MD Shayne Alken, MD in 7 days   Get CBC, CMP, 2 view Chest X ray -  checked next visit with your primary MD or SNF MD   Activity: As tolerated with Full fall precautions use walker/cane & assistance as needed  Disposition SNF  Diet: Clear liquid diet by mouth for comfort with feeding assistance and aspiration precautions.  All other diet and medications via PEG tube  Special Instructions: If you have smoked or chewed Tobacco  in the last 2 yrs please stop smoking, stop any regular Alcohol  and or any Recreational drug use.  On your next visit with your primary care physician please Get Medicines reviewed and adjusted.  Please request your Prim.MD to go over all Hospital Tests and Procedure/Radiological results at the follow up, please get all Hospital records sent to your Prim MD by signing hospital release before you go home.  If you experience worsening of your admission symptoms, develop shortness of breath, life threatening emergency, suicidal or homicidal thoughts you must seek medical attention immediately by calling 911 or calling your MD immediately  if symptoms less severe.  You Must read complete instructions/literature along with all the possible adverse reactions/side effects for all the Medicines you take and that have been prescribed to you. Take any new Medicines after you have completely understood and accpet all the possible adverse reactions/side effects.

## 2023-01-30 NOTE — TOC Transition Note (Addendum)
Transition of Care [facility]) - CM/SW Discharge Note   Patient Details  Name: Cynthia Underwood MRN: [id number] Date of Birth: Oct 24, 1970  Transition of Care [facility]) CM/SW Contact:  Mearl Latin, LCSW Phone Number: 01/30/2023, 9:02 AM   Clinical Narrative:    9am-CSW left HIPAA compliant voicemail for patient's sister as requested to update her on patient's discharge to [facility] today (private room).   11am-Patient sleeping upon CSW's visit.  11:40am-CSW received return call from patient's sister, [name]. She requested [facility] for transport.    Final next level of care: Skilled Nursing Facility Barriers to Discharge: Barriers Resolved   Patient Goals and CMS Choice CMS Medicare.gov Compare Post Acute Care list provided to:: Patient Choice offered to / list presented to : Patient, Parent  Discharge Placement     Existing PASRR number confirmed : 01/30/23          Patient chooses bed at: [facility] Patient to be transferred to facility by: [facility] Name of family member notified: Sister Patient and family notified of of transfer: 01/30/23  Discharge Plan and Services Additional resources added to the After Visit Summary for   In-house Referral: Clinical Social Work   Post Acute Care Choice: Home Health, Skilled Nursing Facility                               Social Determinants of Health (SDOH) Interventions SDOH Screenings   Food Insecurity: No Food Insecurity (01/23/2023)  Housing: Low Risk  (01/23/2023)  Transportation Needs: No Transportation Needs (01/23/2023)  Utilities: Not At Risk (01/23/2023)  Alcohol Screen: Low Risk  (10/16/2022)  Depression (PHQ2-9): Medium Risk (11/08/2022)  Financial Resource Strain: Low Risk  (10/16/2022)  Physical Activity: Inactive (10/16/2022)  Social Connections: Socially Isolated (10/16/2022)  Stress: Stress Concern Present (10/16/2022)  Tobacco Use: High Risk (01/23/2023)     Readmission Risk Interventions     No  data to display

## 2023-01-30 NOTE — Care Management Important Message (Signed)
Important Message  Patient Details  Name: Cynthia Underwood MRN: [id number] Date of Birth: 1971/05/25   Medicare Important Message Given:  Yes     [name] Stefan Church 01/30/2023, 3:50 PM

## 2023-01-31 DIAGNOSIS — E039 Hypothyroidism, unspecified: Secondary | ICD-10-CM | POA: Diagnosis not present

## 2023-01-31 DIAGNOSIS — I509 Heart failure, unspecified: Secondary | ICD-10-CM | POA: Diagnosis not present

## 2023-01-31 DIAGNOSIS — R531 Weakness: Secondary | ICD-10-CM | POA: Diagnosis not present

## 2023-01-31 DIAGNOSIS — R52 Pain, unspecified: Secondary | ICD-10-CM | POA: Diagnosis not present

## 2023-01-31 DIAGNOSIS — J690 Pneumonitis due to inhalation of food and vomit: Secondary | ICD-10-CM | POA: Diagnosis not present

## 2023-01-31 DIAGNOSIS — I10 Essential (primary) hypertension: Secondary | ICD-10-CM | POA: Diagnosis not present

## 2023-01-31 DIAGNOSIS — R195 Other fecal abnormalities: Secondary | ICD-10-CM | POA: Diagnosis not present

## 2023-01-31 DIAGNOSIS — R051 Acute cough: Secondary | ICD-10-CM | POA: Diagnosis not present

## 2023-02-02 DIAGNOSIS — R52 Pain, unspecified: Secondary | ICD-10-CM | POA: Diagnosis not present

## 2023-02-02 DIAGNOSIS — J690 Pneumonitis due to inhalation of food and vomit: Secondary | ICD-10-CM | POA: Diagnosis not present

## 2023-02-02 DIAGNOSIS — R195 Other fecal abnormalities: Secondary | ICD-10-CM | POA: Diagnosis not present

## 2023-02-02 DIAGNOSIS — I509 Heart failure, unspecified: Secondary | ICD-10-CM | POA: Diagnosis not present

## 2023-02-02 DIAGNOSIS — R531 Weakness: Secondary | ICD-10-CM | POA: Diagnosis not present

## 2023-02-02 DIAGNOSIS — R051 Acute cough: Secondary | ICD-10-CM | POA: Diagnosis not present

## 2023-02-02 DIAGNOSIS — E039 Hypothyroidism, unspecified: Secondary | ICD-10-CM | POA: Diagnosis not present

## 2023-02-04 DIAGNOSIS — R531 Weakness: Secondary | ICD-10-CM | POA: Diagnosis not present

## 2023-02-04 DIAGNOSIS — R051 Acute cough: Secondary | ICD-10-CM | POA: Diagnosis not present

## 2023-02-04 DIAGNOSIS — R110 Nausea: Secondary | ICD-10-CM | POA: Diagnosis not present

## 2023-02-05 DIAGNOSIS — R531 Weakness: Secondary | ICD-10-CM | POA: Diagnosis not present

## 2023-02-05 DIAGNOSIS — J984 Other disorders of lung: Secondary | ICD-10-CM | POA: Diagnosis not present

## 2023-02-06 DIAGNOSIS — I10 Essential (primary) hypertension: Secondary | ICD-10-CM | POA: Diagnosis not present

## 2023-02-06 DIAGNOSIS — R1312 Dysphagia, oropharyngeal phase: Secondary | ICD-10-CM | POA: Diagnosis not present

## 2023-02-06 DIAGNOSIS — E43 Unspecified severe protein-calorie malnutrition: Secondary | ICD-10-CM | POA: Diagnosis not present

## 2023-02-07 DIAGNOSIS — E43 Unspecified severe protein-calorie malnutrition: Secondary | ICD-10-CM | POA: Diagnosis not present

## 2023-02-07 DIAGNOSIS — R1312 Dysphagia, oropharyngeal phase: Secondary | ICD-10-CM | POA: Diagnosis not present

## 2023-02-12 ENCOUNTER — Other Ambulatory Visit: Payer: Self-pay | Admitting: *Deleted

## 2023-02-12 DIAGNOSIS — R1312 Dysphagia, oropharyngeal phase: Secondary | ICD-10-CM | POA: Diagnosis not present

## 2023-02-12 DIAGNOSIS — Z9981 Dependence on supplemental oxygen: Secondary | ICD-10-CM | POA: Diagnosis not present

## 2023-02-12 DIAGNOSIS — E43 Unspecified severe protein-calorie malnutrition: Secondary | ICD-10-CM | POA: Diagnosis not present

## 2023-02-12 NOTE — Patient Outreach (Signed)
Ms. Moye resides in [facility] skilled nursing facility.  Screening for potential [facility] care coordination services as a benefit of health plan and primary care provider.  Update previously received from [place], [facility] discharge planner. Ms. Mcadams doing well with therapy. Has multiple medical conditions.   Will continue to follow.   Raiford Noble, MSN, RN,BSN [facility] Post Acute Care Coordinator [phone] (Direct dial)

## 2023-02-13 ENCOUNTER — Inpatient Hospital Stay: Payer: 59 | Admitting: Family Medicine

## 2023-02-14 DIAGNOSIS — R531 Weakness: Secondary | ICD-10-CM | POA: Diagnosis not present

## 2023-02-14 DIAGNOSIS — E43 Unspecified severe protein-calorie malnutrition: Secondary | ICD-10-CM | POA: Diagnosis not present

## 2023-02-14 DIAGNOSIS — Z431 Encounter for attention to gastrostomy: Secondary | ICD-10-CM | POA: Diagnosis not present

## 2023-02-17 DIAGNOSIS — R531 Weakness: Secondary | ICD-10-CM | POA: Diagnosis not present

## 2023-02-17 DIAGNOSIS — Z931 Gastrostomy status: Secondary | ICD-10-CM | POA: Diagnosis not present

## 2023-02-19 ENCOUNTER — Ambulatory Visit (INDEPENDENT_AMBULATORY_CARE_PROVIDER_SITE_OTHER): Payer: 59

## 2023-02-19 DIAGNOSIS — I495 Sick sinus syndrome: Secondary | ICD-10-CM | POA: Diagnosis not present

## 2023-02-20 ENCOUNTER — Telehealth: Payer: Self-pay | Admitting: Family Medicine

## 2023-02-20 ENCOUNTER — Other Ambulatory Visit: Payer: Self-pay | Admitting: *Deleted

## 2023-02-20 DIAGNOSIS — G7111 Myotonic muscular dystrophy: Secondary | ICD-10-CM

## 2023-02-20 LAB — CUP PACEART REMOTE DEVICE CHECK
Battery Remaining Longevity: 57 mo
Battery Voltage: 2.96 V
Brady Statistic AP VP Percent: 15.07 %
Brady Statistic AP VS Percent: 31.05 %
Brady Statistic AS VP Percent: 39.24 %
Brady Statistic AS VS Percent: 14.64 %
Brady Statistic RA Percent Paced: 46.15 %
Brady Statistic RV Percent Paced: 54.31 %
Date Time Interrogation Session: 20240617102814
Implantable Lead Connection Status: 753985
Implantable Lead Connection Status: 753985
Implantable Lead Implant Date: 20180614
Implantable Lead Implant Date: 20180614
Implantable Lead Location: 753859
Implantable Lead Location: 753859
Implantable Lead Model: 3830
Implantable Lead Model: 5076
Implantable Pulse Generator Implant Date: 20180614
Lead Channel Impedance Value: 285 Ohm
Lead Channel Impedance Value: 342 Ohm
Lead Channel Impedance Value: 437 Ohm
Lead Channel Impedance Value: 551 Ohm
Lead Channel Pacing Threshold Amplitude: 0.5 V
Lead Channel Pacing Threshold Amplitude: 1.125 V
Lead Channel Pacing Threshold Pulse Width: 0.4 ms
Lead Channel Pacing Threshold Pulse Width: 0.4 ms
Lead Channel Sensing Intrinsic Amplitude: 2.875 mV
Lead Channel Sensing Intrinsic Amplitude: 2.875 mV
Lead Channel Sensing Intrinsic Amplitude: 3.875 mV
Lead Channel Sensing Intrinsic Amplitude: 3.875 mV
Lead Channel Setting Pacing Amplitude: 2 V
Lead Channel Setting Pacing Amplitude: 2 V
Lead Channel Setting Pacing Pulse Width: 1 ms
Lead Channel Setting Sensing Sensitivity: 0.9 mV
Zone Setting Status: 755011
Zone Setting Status: 755011

## 2023-02-20 NOTE — Patient Outreach (Signed)
Per [facility] Ms. [facility] discharged from [facility] skilled nursing facility on 02/18/23. Screening for potential [facility] care coordination services as benefit of health plan and  Primary Care Provider.  Confirmed with [facility] discharge planner, [facility] was arranged. States Ms. Dross returned home with cousin.   Telephone call made to Ms. Denise [phone]. No answer. HIPAA compliant voicemail message left to request return call.   Raiford Noble, MSN, RN,BSN [facility] Post Acute Care Coordinator [phone] (Direct dial)

## 2023-02-20 NOTE — Telephone Encounter (Signed)
Spoke with patient . Verified name & DOB  Permission obtained to speak  with patient 's Cynthia Underwood. Cynthia Underwood voiced that she called because the appointment  the patient was given in September is to far away, Voiced that they needed a hospital follow-up with PCP earlier d/t to the need to obtained referrals for pulmonary and GI. Patient given appointment with PCP for 03/13/2023. Also referral for to [facility] placed. Patient is aware and agrees to the referral. Please updated DPR to reflect to [facility] can obtain medical information pertaining to patient. Thank you.

## 2023-02-20 NOTE — Telephone Encounter (Signed)
Pt sister [name] is reaching out as pt is in bad situation, She needs to be seen prior Sept as needs a HFU as was told needs a Lung Specialist as pneumonia and Respiratory failure, also has a GI issue has a tube in place and needs supplies, pls reach out to [place] for sch asap @ [phone] She is sorry for cancellation but mostly due to her sister is going in and out of rehab and the hospital.

## 2023-02-21 ENCOUNTER — Other Ambulatory Visit: Payer: Self-pay | Admitting: *Deleted

## 2023-02-21 DIAGNOSIS — I5022 Chronic systolic (congestive) heart failure: Secondary | ICD-10-CM

## 2023-02-21 NOTE — Patient Outreach (Addendum)
Post-Acute Care Coordinator follow up. Cynthia Underwood discharged from [facility] SNF on 02/18/23. Screening for potential care coordination services as benefit of health plan and PCP.   Telephone call made to Cynthia Underwood [phone]. Patient identifiers confirmed. Explained care coordination services. Cynthia Underwood asked that writer contact her sister Cynthia Underwood to discuss further.   Telephone call made to [facility] (sister/DPR/HCPOA) [phone]. Patient identifiers confirmed. Cynthia Underwood states Cynthia Underwood is just starting to use her voice again. Therefore, it is difficult to hear her on the phone. Cynthia Underwood confirms PCP appointment has been scheduled, [facility] is slated to come out tomorrow. Cynthia Underwood lives with her cousin Cynthia Underwood. Cynthia Underwood takes Cynthia Underwood to appointments. Cynthia Underwood states she lives in [place] but comes down to visit almost weekly. Cynthia Underwood (sister/DPR/HCPOA) confirms she is primary contact person at [phone].  Agreeable to care coordination follow up.   Will make referral for RN care coordination services.   Cynthia Noble, MSN, RN,BSN [facility] Post Acute Care Coordinator [phone] (Direct dial)

## 2023-02-22 ENCOUNTER — Telehealth: Payer: Self-pay | Admitting: *Deleted

## 2023-02-22 DIAGNOSIS — I5042 Chronic combined systolic (congestive) and diastolic (congestive) heart failure: Secondary | ICD-10-CM | POA: Diagnosis not present

## 2023-02-22 DIAGNOSIS — Z950 Presence of cardiac pacemaker: Secondary | ICD-10-CM | POA: Diagnosis not present

## 2023-02-22 DIAGNOSIS — I444 Left anterior fascicular block: Secondary | ICD-10-CM | POA: Diagnosis not present

## 2023-02-22 DIAGNOSIS — K219 Gastro-esophageal reflux disease without esophagitis: Secondary | ICD-10-CM | POA: Diagnosis not present

## 2023-02-22 DIAGNOSIS — Z9981 Dependence on supplemental oxygen: Secondary | ICD-10-CM | POA: Diagnosis not present

## 2023-02-22 DIAGNOSIS — I428 Other cardiomyopathies: Secondary | ICD-10-CM | POA: Diagnosis not present

## 2023-02-22 DIAGNOSIS — I4510 Unspecified right bundle-branch block: Secondary | ICD-10-CM | POA: Diagnosis not present

## 2023-02-22 DIAGNOSIS — E039 Hypothyroidism, unspecified: Secondary | ICD-10-CM | POA: Diagnosis not present

## 2023-02-22 DIAGNOSIS — J9611 Chronic respiratory failure with hypoxia: Secondary | ICD-10-CM | POA: Diagnosis not present

## 2023-02-22 DIAGNOSIS — J690 Pneumonitis due to inhalation of food and vomit: Secondary | ICD-10-CM | POA: Diagnosis not present

## 2023-02-22 DIAGNOSIS — E042 Nontoxic multinodular goiter: Secondary | ICD-10-CM | POA: Diagnosis not present

## 2023-02-22 NOTE — Progress Notes (Signed)
  Care Coordination   Note   02/22/2023 Name: ADYLYN HEITING MRN: [id number] DOB: Jul 25, 1971  [name] is a 52 y.o. year old female who sees Hoy Register, MD for primary care. I reached out to [name] by phone today to offer care coordination services.  Ms. Isbill was given information about Care Coordination services today including:   The Care Coordination services include support from the care team which includes your Nurse Coordinator, Clinical Social Worker, or Pharmacist.  The Care Coordination team is here to help remove barriers to the health concerns and goals most important to you. Care Coordination services are voluntary, and the patient may decline or stop services at any time by request to their care team member.   Care Coordination Consent Status: Patient agreed to services and verbal consent obtained.   Follow up plan:  Telephone appointment with care coordination team member scheduled for:  02/23/2023  Encounter Outcome:  Pt. Scheduled from referral   Burman Nieves, [facility] Care Coordination Care Guide Direct Dial: [phone]

## 2023-02-23 ENCOUNTER — Ambulatory Visit: Payer: Self-pay

## 2023-02-23 ENCOUNTER — Telehealth: Payer: Self-pay

## 2023-02-23 DIAGNOSIS — I5043 Acute on chronic combined systolic (congestive) and diastolic (congestive) heart failure: Secondary | ICD-10-CM | POA: Diagnosis not present

## 2023-02-23 DIAGNOSIS — R1310 Dysphagia, unspecified: Secondary | ICD-10-CM | POA: Diagnosis not present

## 2023-02-23 DIAGNOSIS — G931 Anoxic brain damage, not elsewhere classified: Secondary | ICD-10-CM | POA: Diagnosis not present

## 2023-02-23 DIAGNOSIS — E43 Unspecified severe protein-calorie malnutrition: Secondary | ICD-10-CM | POA: Diagnosis not present

## 2023-02-23 DIAGNOSIS — Z931 Gastrostomy status: Secondary | ICD-10-CM | POA: Diagnosis not present

## 2023-02-23 NOTE — Patient Outreach (Signed)
Care Coordination   Initial Visit Note   02/23/2023 Name: Cynthia Underwood MRN: [id number] DOB: 12-11-1970  Cynthia Underwood is a 52 y.o. year old female who sees Hoy Register, MD for primary care. I spoke with  Cynthia Underwood and sister Cynthia Underwood by phone today.  What matters to the patients health and wellness today?  Patient would like to regain her ability to eat/drink orally without aspirating. She would like to get established with a GI and Pulmonary doctor.     Goals Addressed             This Visit's Progress    To be able to eat and drink normally and regain my speech       Care Coordination Interventions: Completed outbound call with patient and sister [name]  Evaluation of current treatment plan related to Impaired Speech and Swallowing and patient's adherence to plan as established by provider Review of patient status, including review of consultant's reports, relevant laboratory and other test results, and medications completed Determined patient is expecting to start ST next week in her home per [facility]  Assessed for current nutritional status, patient is administering tube feed boluses as directed, she is independent in doing so and also has family available to assist her as needed Determined patient is taking in oral liquids as directed Educated patient while providing rationale on the benefits of working with ST Educated patient regarding signs/symptoms suggestive of impaired swallowing with or without aspiration Educated patient on when to seek medial attention at onset of any/all symptoms that may occur Educated patient on the importance of sitting upright when taking in oral liquids and drink slowly  Determined patient needs a referral for GI for ongoing follow up for dysphagia Reviewed with patient and sister Cynthia Underwood upcoming scheduled appointment with PCP, [name] for a post hospital follow up scheduled for 03/13/23 @10 :50  AM Routed note to PCP     To establish with a Pulmonologist for monitoring of recent pneumonia       Care Coordination Interventions: Completed successful outbound call with patient and sister Cynthia Underwood  Evaluation of current treatment plan related to aspirational pneumonia  and patient's adherence to plan as established by provider Review of patient status, including review of consultant's reports, relevant laboratory and other test results Educated patient and sister on signs/symptoms suggestive of pneumonia and when to seek medical attention at onset of symptoms  Determined patient has oxygen to use as needed, she does not currently have a pulse ox Educated on [facility] OTC benefit and patient may be able to purchase a pulse ox or pay out of pocket Determined patient will benefit from speaking with a SW to help determine benefits and assist with utilizing such benefits Sent SW referral to [facility] BSW requesting further assistance  Determined patient would like a referral for a Pulmonologist to evaluate recent pneumonia  Determined patient will discuss this referral with PCP at next scheduled visit set for 03/13/23 @10 :50 AM Routed note to [name], PCP    Interventions Today    Flowsheet Row Most Recent Value  Chronic Disease   Chronic disease during today's visit Other  [Impaired swallowing, hx of aspirational pneumonia]  General Interventions   General Interventions Discussed/Reviewed General Interventions Discussed, General Interventions Reviewed, Doctor Visits, Durable Medical Equipment (DME), Communication with  Durable Medical Equipment (DME) Oxygen  Communication with PCP/Specialists  [routed note to [name] PCP]  Exercise Interventions   Exercise  Discussed/Reviewed Physical Activity  Physical Activity Discussed/Reviewed Home Exercise Program (HEP), Physical Activity Discussed, Physical Activity Reviewed  [PT/OT/ST to start next week per [facility]]  Education  Interventions   Education Provided Provided Education  Provided Verbal Education On When to see the doctor, Medication, Nutrition, Other  [[employer] OTC benefit]  Nutrition Interventions   Nutrition Discussed/Reviewed Nutrition Discussed, Nutrition Reviewed, Supplemental nutrition  [tube feeding]  Pharmacy Interventions   Pharmacy Dicussed/Reviewed Pharmacy Topics Reviewed, Pharmacy Topics Discussed, Medications and their functions  Safety Interventions   Safety Discussed/Reviewed Home Safety  Home Safety Assistive Devices          SDOH assessments and interventions completed:  No     Care Coordination Interventions:  Yes, provided   Follow up plan: Referral made to SW Bevelyn Ngo BSW to assist with educating on [facility] benefits Follow up call scheduled for 03/15/23 @2 :00 PM    Encounter Outcome:  Pt. Visit Completed

## 2023-02-23 NOTE — Telephone Encounter (Signed)
Copied from CRM [id number]. Topic: General - Other >> Feb 23, 2023 11:57 AM Epimenio Foot F wrote: Reason for CRM: Home Health Verbal Orders - Caller/Agency: [facility]  Callback Number:  [phone] Requesting OT/PT/Skilled Nursing/Social Work/Speech Therapy: Nursing  Frequency: 1x week 4   Also requesting a speech therapy evaluation. Charlynne Pander says orders can be left on her VM as it is secure.

## 2023-02-23 NOTE — Patient Instructions (Signed)
Visit Information  Thank you for taking time to visit with me today. Please don't hesitate to contact me if I can be of assistance to you.   Following are the goals we discussed today:   Goals Addressed             This Visit's Progress    To be able to eat and drink normally and regain my speech       Care Coordination Interventions: Completed outbound call with patient and sister Mylinda Brook  Evaluation of current treatment plan related to Impaired Speech and Swallowing and patient's adherence to plan as established by provider Review of patient status, including review of consultant's reports, relevant laboratory and other test results, and medications completed Determined patient is expecting to start ST next week in her home per [facility]  Assessed for current nutritional status, patient is administering tube feed boluses as directed, she is independent in doing so and also has family available to assist her as needed Determined patient is taking in oral liquids as directed Educated patient while providing rationale on the benefits of working with ST Educated patient regarding signs/symptoms suggestive of impaired swallowing with or without aspiration Educated patient on when to seek medial attention at onset of any/all symptoms that may occur Educated patient on the importance of sitting upright when taking in oral liquids and drink slowly  Determined patient needs a referral for GI for ongoing follow up for dysphagia Reviewed with patient and sister [name] upcoming scheduled appointment with PCP, [name] for a post hospital follow up scheduled for 03/13/23 @10 :50 AM Routed note to PCP        To establish with a Pulmonologist for monitoring of recent pneumonia       Care Coordination Interventions: Completed successful outbound call with patient and sister [name]  Evaluation of current treatment plan related to aspirational pneumonia  and patient's adherence to  plan as established by provider Review of patient status, including review of consultant's reports, relevant laboratory and other test results Educated patient and sister on signs/symptoms suggestive of pneumonia and when to seek medical attention at onset of symptoms  Determined patient has oxygen to use as needed, she does not currently have a pulse ox Educated on [facility] OTC benefit and patient may be able to purchase a pulse ox or pay out of pocket Determined patient will benefit from speaking with a SW to help determine benefits and assist with utilizing such benefits Sent SW referral to [facility] BSW requesting further assistance  Determined patient would like a referral for a Pulmonologist to evaluate recent pneumonia  Determined patient will discuss this referral with PCP at next scheduled visit set for 03/13/23 @10 :50 AM Routed note to [name], PCP          Our next appointment is by telephone on 03/15/23 at 2:00 PM  Please call the care guide team at [phone] if you need to cancel or reschedule your appointment.   If you are experiencing a Mental Health or Behavioral Health Crisis or need someone to talk to, please call 1-800-273-TALK (toll free, 24 hour hotline)  Patient verbalizes understanding of instructions and care plan provided today and agrees to view in MyChart. Active MyChart status and patient understanding of how to access instructions and care plan via MyChart confirmed with patient.     [name], RN, BSN, CCM Care Management Coordinator [facility] Care Management  Direct Phone: [phone]

## 2023-02-26 ENCOUNTER — Telehealth: Payer: Self-pay | Admitting: Family Medicine

## 2023-02-26 ENCOUNTER — Encounter: Payer: Self-pay | Admitting: Neurology

## 2023-02-26 DIAGNOSIS — Z9981 Dependence on supplemental oxygen: Secondary | ICD-10-CM | POA: Diagnosis not present

## 2023-02-26 DIAGNOSIS — E039 Hypothyroidism, unspecified: Secondary | ICD-10-CM | POA: Diagnosis not present

## 2023-02-26 DIAGNOSIS — I4510 Unspecified right bundle-branch block: Secondary | ICD-10-CM | POA: Diagnosis not present

## 2023-02-26 DIAGNOSIS — J690 Pneumonitis due to inhalation of food and vomit: Secondary | ICD-10-CM | POA: Diagnosis not present

## 2023-02-26 DIAGNOSIS — I428 Other cardiomyopathies: Secondary | ICD-10-CM | POA: Diagnosis not present

## 2023-02-26 DIAGNOSIS — K219 Gastro-esophageal reflux disease without esophagitis: Secondary | ICD-10-CM | POA: Diagnosis not present

## 2023-02-26 DIAGNOSIS — I444 Left anterior fascicular block: Secondary | ICD-10-CM | POA: Diagnosis not present

## 2023-02-26 DIAGNOSIS — J9611 Chronic respiratory failure with hypoxia: Secondary | ICD-10-CM | POA: Diagnosis not present

## 2023-02-26 DIAGNOSIS — E042 Nontoxic multinodular goiter: Secondary | ICD-10-CM | POA: Diagnosis not present

## 2023-02-26 DIAGNOSIS — Z950 Presence of cardiac pacemaker: Secondary | ICD-10-CM | POA: Diagnosis not present

## 2023-02-26 DIAGNOSIS — I5042 Chronic combined systolic (congestive) and diastolic (congestive) heart failure: Secondary | ICD-10-CM | POA: Diagnosis not present

## 2023-02-26 NOTE — Telephone Encounter (Signed)
Order given to  [facility] with [facility] Callback Number: [phone] Requesting PT Frequency: 2 x wk for 4 wks, 1 x wk for 4 wks

## 2023-02-26 NOTE — Telephone Encounter (Signed)
Home Health Verbal Orders - Caller/Agency: Reuel Boom with [facility] Callback Number: [phone] Requesting PT Frequency: 2 x wk for 4 wks, 1 x wk for 4 wks  Please assist patient further

## 2023-02-28 DIAGNOSIS — I444 Left anterior fascicular block: Secondary | ICD-10-CM | POA: Diagnosis not present

## 2023-02-28 DIAGNOSIS — I428 Other cardiomyopathies: Secondary | ICD-10-CM | POA: Diagnosis not present

## 2023-02-28 DIAGNOSIS — Z950 Presence of cardiac pacemaker: Secondary | ICD-10-CM | POA: Diagnosis not present

## 2023-02-28 DIAGNOSIS — E039 Hypothyroidism, unspecified: Secondary | ICD-10-CM | POA: Diagnosis not present

## 2023-02-28 DIAGNOSIS — J9611 Chronic respiratory failure with hypoxia: Secondary | ICD-10-CM | POA: Diagnosis not present

## 2023-02-28 DIAGNOSIS — J690 Pneumonitis due to inhalation of food and vomit: Secondary | ICD-10-CM | POA: Diagnosis not present

## 2023-02-28 DIAGNOSIS — E042 Nontoxic multinodular goiter: Secondary | ICD-10-CM | POA: Diagnosis not present

## 2023-02-28 DIAGNOSIS — I5042 Chronic combined systolic (congestive) and diastolic (congestive) heart failure: Secondary | ICD-10-CM | POA: Diagnosis not present

## 2023-02-28 DIAGNOSIS — I4510 Unspecified right bundle-branch block: Secondary | ICD-10-CM | POA: Diagnosis not present

## 2023-02-28 DIAGNOSIS — K219 Gastro-esophageal reflux disease without esophagitis: Secondary | ICD-10-CM | POA: Diagnosis not present

## 2023-02-28 DIAGNOSIS — Z9981 Dependence on supplemental oxygen: Secondary | ICD-10-CM | POA: Diagnosis not present

## 2023-02-28 NOTE — Telephone Encounter (Signed)
Duplicate message verbal order has been given.

## 2023-03-01 DIAGNOSIS — I5042 Chronic combined systolic (congestive) and diastolic (congestive) heart failure: Secondary | ICD-10-CM | POA: Diagnosis not present

## 2023-03-01 DIAGNOSIS — I444 Left anterior fascicular block: Secondary | ICD-10-CM | POA: Diagnosis not present

## 2023-03-01 DIAGNOSIS — I428 Other cardiomyopathies: Secondary | ICD-10-CM | POA: Diagnosis not present

## 2023-03-01 DIAGNOSIS — K219 Gastro-esophageal reflux disease without esophagitis: Secondary | ICD-10-CM | POA: Diagnosis not present

## 2023-03-01 DIAGNOSIS — Z950 Presence of cardiac pacemaker: Secondary | ICD-10-CM | POA: Diagnosis not present

## 2023-03-01 DIAGNOSIS — J9611 Chronic respiratory failure with hypoxia: Secondary | ICD-10-CM | POA: Diagnosis not present

## 2023-03-01 DIAGNOSIS — Z9981 Dependence on supplemental oxygen: Secondary | ICD-10-CM | POA: Diagnosis not present

## 2023-03-01 DIAGNOSIS — I4510 Unspecified right bundle-branch block: Secondary | ICD-10-CM | POA: Diagnosis not present

## 2023-03-01 DIAGNOSIS — E039 Hypothyroidism, unspecified: Secondary | ICD-10-CM | POA: Diagnosis not present

## 2023-03-01 DIAGNOSIS — J690 Pneumonitis due to inhalation of food and vomit: Secondary | ICD-10-CM | POA: Diagnosis not present

## 2023-03-01 DIAGNOSIS — E042 Nontoxic multinodular goiter: Secondary | ICD-10-CM | POA: Diagnosis not present

## 2023-03-02 ENCOUNTER — Telehealth: Payer: Self-pay | Admitting: Family Medicine

## 2023-03-02 ENCOUNTER — Ambulatory Visit: Payer: Self-pay

## 2023-03-02 DIAGNOSIS — K219 Gastro-esophageal reflux disease without esophagitis: Secondary | ICD-10-CM | POA: Diagnosis not present

## 2023-03-02 DIAGNOSIS — I4510 Unspecified right bundle-branch block: Secondary | ICD-10-CM | POA: Diagnosis not present

## 2023-03-02 DIAGNOSIS — I428 Other cardiomyopathies: Secondary | ICD-10-CM | POA: Diagnosis not present

## 2023-03-02 DIAGNOSIS — Z950 Presence of cardiac pacemaker: Secondary | ICD-10-CM | POA: Diagnosis not present

## 2023-03-02 DIAGNOSIS — E039 Hypothyroidism, unspecified: Secondary | ICD-10-CM | POA: Diagnosis not present

## 2023-03-02 DIAGNOSIS — I5042 Chronic combined systolic (congestive) and diastolic (congestive) heart failure: Secondary | ICD-10-CM | POA: Diagnosis not present

## 2023-03-02 DIAGNOSIS — I444 Left anterior fascicular block: Secondary | ICD-10-CM | POA: Diagnosis not present

## 2023-03-02 DIAGNOSIS — J9611 Chronic respiratory failure with hypoxia: Secondary | ICD-10-CM | POA: Diagnosis not present

## 2023-03-02 DIAGNOSIS — Z9981 Dependence on supplemental oxygen: Secondary | ICD-10-CM | POA: Diagnosis not present

## 2023-03-02 DIAGNOSIS — E042 Nontoxic multinodular goiter: Secondary | ICD-10-CM | POA: Diagnosis not present

## 2023-03-02 DIAGNOSIS — J690 Pneumonitis due to inhalation of food and vomit: Secondary | ICD-10-CM | POA: Diagnosis not present

## 2023-03-02 NOTE — Patient Instructions (Signed)
Visit Information  Thank you for taking time to visit with me today. Please don't hesitate to contact me if I can be of assistance to you.   Following are the goals we discussed today:  - Contact your health plan as desired to access your OTC benefit  Your next appointment is by telephone on 7/11 at 2:00 pm with RN Care Manager Lawanna Kobus Little  Please call the care guide team at [phone] if you need to cancel or reschedule your appointment.   If you are experiencing a Mental Health or Behavioral Health Crisis or need someone to talk to, please go to [facility] [address], [place] [phone]) call 911  Patient verbalizes understanding of instructions and care plan provided today and agrees to view in MyChart. Active MyChart status and patient understanding of how to access instructions and care plan via MyChart confirmed with patient.     No further follow up required: Please contact me as needed.  Bevelyn Ngo, BSW, CDP Social Worker, Certified Dementia Practitioner [facility] Care Management  Care Coordination [phone]

## 2023-03-02 NOTE — Telephone Encounter (Signed)
Edman Circle with [facility]  Callback Number: [phone] Requesting : Speech Therapy Frequency: 1 x 8   Order given

## 2023-03-02 NOTE — Telephone Encounter (Signed)
Copied from CRM [id number]. Topic: Quick Communication - Home Health Verbal Orders >> Mar 02, 2023 10:35 AM Macon Large wrote: Caller/Agency: Myrtie Neither with [facility]  Callback Number: [phone] Requesting OT/PT/Skilled Nursing/Social Work/Speech Therapy: Speech Therapy Frequency: 1 x 8

## 2023-03-02 NOTE — Patient Outreach (Signed)
  Care Coordination   Follow Up Visit Note   03/02/2023 Name: Cynthia Underwood MRN: [id number] DOB: 1971/01/26  Cynthia Underwood is a 52 y.o. year old female who sees Cynthia Register, MD for primary care. I  spoke with patients sister Cynthia Underwood by phone to follow up on care coordination needs.  What matters to the patients health and wellness today?  No concerns, patient is doing well at this time.    Goals Addressed             This Visit's Progress    COMPLETED: Care Coordination Activities       Care Coordination Interventions: Collaboration with RN Care Manager Cynthia Underwood who requests SW assistance with OTC Benefit education and assistance with ordering a pulse oximeter Contacted the patients sister Cynthia Underwood who advises they have purchased a pulse oximeter for the patient Verbal education provided on what an OTC Benefit is and how to access - Cynthia Underwood will call the patients health plan if desired to order items Discussed patient is aware of [employer] benefit and uses this purchase groceries No other SW needs identified that this time - encouraged Cynthia Underwood to contact SW as needed Collaboration with Medical illustrator to advise patient has obtained a pulse oximeter as desired         SDOH assessments and interventions completed:  Yes  SDOH Interventions Today    Flowsheet Row Most Recent Value  SDOH Interventions   Food Insecurity Interventions Intervention Not Indicated  Housing Interventions Intervention Not Indicated  Transportation Interventions Intervention Not Indicated        Care Coordination Interventions:  Yes, provided   Interventions Today    Flowsheet Row Most Recent Value  Chronic Disease   Chronic disease during today's visit Other  General Interventions   General Interventions Discussed/Reviewed General Interventions Discussed, Communication with  Communication with RN  Education Interventions   Education Provided Provided Education  Provided Verbal  Education On Insurance Plans        Follow up plan: No further intervention required. The patient will remain engaged with RN Care Manager.    Encounter Outcome:  Pt. Visit Completed   Cynthia Underwood, BSW, CDP Social Worker, Certified Dementia Practitioner [facility] Care Management  Care Coordination [phone]

## 2023-03-05 DIAGNOSIS — I5043 Acute on chronic combined systolic (congestive) and diastolic (congestive) heart failure: Secondary | ICD-10-CM | POA: Diagnosis not present

## 2023-03-05 DIAGNOSIS — J9601 Acute respiratory failure with hypoxia: Secondary | ICD-10-CM | POA: Diagnosis not present

## 2023-03-06 DIAGNOSIS — I5042 Chronic combined systolic (congestive) and diastolic (congestive) heart failure: Secondary | ICD-10-CM | POA: Diagnosis not present

## 2023-03-06 DIAGNOSIS — K219 Gastro-esophageal reflux disease without esophagitis: Secondary | ICD-10-CM | POA: Diagnosis not present

## 2023-03-06 DIAGNOSIS — I444 Left anterior fascicular block: Secondary | ICD-10-CM | POA: Diagnosis not present

## 2023-03-06 DIAGNOSIS — Z9981 Dependence on supplemental oxygen: Secondary | ICD-10-CM | POA: Diagnosis not present

## 2023-03-06 DIAGNOSIS — J9611 Chronic respiratory failure with hypoxia: Secondary | ICD-10-CM | POA: Diagnosis not present

## 2023-03-06 DIAGNOSIS — E042 Nontoxic multinodular goiter: Secondary | ICD-10-CM | POA: Diagnosis not present

## 2023-03-06 DIAGNOSIS — J690 Pneumonitis due to inhalation of food and vomit: Secondary | ICD-10-CM | POA: Diagnosis not present

## 2023-03-06 DIAGNOSIS — I4510 Unspecified right bundle-branch block: Secondary | ICD-10-CM | POA: Diagnosis not present

## 2023-03-06 DIAGNOSIS — I428 Other cardiomyopathies: Secondary | ICD-10-CM | POA: Diagnosis not present

## 2023-03-06 DIAGNOSIS — E039 Hypothyroidism, unspecified: Secondary | ICD-10-CM | POA: Diagnosis not present

## 2023-03-06 DIAGNOSIS — Z950 Presence of cardiac pacemaker: Secondary | ICD-10-CM | POA: Diagnosis not present

## 2023-03-07 DIAGNOSIS — J9611 Chronic respiratory failure with hypoxia: Secondary | ICD-10-CM | POA: Diagnosis not present

## 2023-03-07 DIAGNOSIS — Z950 Presence of cardiac pacemaker: Secondary | ICD-10-CM | POA: Diagnosis not present

## 2023-03-07 DIAGNOSIS — I5042 Chronic combined systolic (congestive) and diastolic (congestive) heart failure: Secondary | ICD-10-CM | POA: Diagnosis not present

## 2023-03-07 DIAGNOSIS — E042 Nontoxic multinodular goiter: Secondary | ICD-10-CM | POA: Diagnosis not present

## 2023-03-07 DIAGNOSIS — I428 Other cardiomyopathies: Secondary | ICD-10-CM | POA: Diagnosis not present

## 2023-03-07 DIAGNOSIS — K219 Gastro-esophageal reflux disease without esophagitis: Secondary | ICD-10-CM | POA: Diagnosis not present

## 2023-03-07 DIAGNOSIS — J690 Pneumonitis due to inhalation of food and vomit: Secondary | ICD-10-CM | POA: Diagnosis not present

## 2023-03-07 DIAGNOSIS — Z9981 Dependence on supplemental oxygen: Secondary | ICD-10-CM | POA: Diagnosis not present

## 2023-03-07 DIAGNOSIS — E039 Hypothyroidism, unspecified: Secondary | ICD-10-CM | POA: Diagnosis not present

## 2023-03-07 DIAGNOSIS — I4510 Unspecified right bundle-branch block: Secondary | ICD-10-CM | POA: Diagnosis not present

## 2023-03-07 DIAGNOSIS — I444 Left anterior fascicular block: Secondary | ICD-10-CM | POA: Diagnosis not present

## 2023-03-09 DIAGNOSIS — E039 Hypothyroidism, unspecified: Secondary | ICD-10-CM | POA: Diagnosis not present

## 2023-03-09 DIAGNOSIS — J9611 Chronic respiratory failure with hypoxia: Secondary | ICD-10-CM | POA: Diagnosis not present

## 2023-03-09 DIAGNOSIS — E042 Nontoxic multinodular goiter: Secondary | ICD-10-CM | POA: Diagnosis not present

## 2023-03-09 DIAGNOSIS — J690 Pneumonitis due to inhalation of food and vomit: Secondary | ICD-10-CM | POA: Diagnosis not present

## 2023-03-09 DIAGNOSIS — I444 Left anterior fascicular block: Secondary | ICD-10-CM | POA: Diagnosis not present

## 2023-03-09 DIAGNOSIS — Z950 Presence of cardiac pacemaker: Secondary | ICD-10-CM | POA: Diagnosis not present

## 2023-03-09 DIAGNOSIS — Z9981 Dependence on supplemental oxygen: Secondary | ICD-10-CM | POA: Diagnosis not present

## 2023-03-09 DIAGNOSIS — I4510 Unspecified right bundle-branch block: Secondary | ICD-10-CM | POA: Diagnosis not present

## 2023-03-09 DIAGNOSIS — I428 Other cardiomyopathies: Secondary | ICD-10-CM | POA: Diagnosis not present

## 2023-03-09 DIAGNOSIS — I5042 Chronic combined systolic (congestive) and diastolic (congestive) heart failure: Secondary | ICD-10-CM | POA: Diagnosis not present

## 2023-03-09 DIAGNOSIS — K219 Gastro-esophageal reflux disease without esophagitis: Secondary | ICD-10-CM | POA: Diagnosis not present

## 2023-03-12 DIAGNOSIS — I5042 Chronic combined systolic (congestive) and diastolic (congestive) heart failure: Secondary | ICD-10-CM | POA: Diagnosis not present

## 2023-03-12 DIAGNOSIS — I444 Left anterior fascicular block: Secondary | ICD-10-CM | POA: Diagnosis not present

## 2023-03-12 DIAGNOSIS — Z950 Presence of cardiac pacemaker: Secondary | ICD-10-CM | POA: Diagnosis not present

## 2023-03-12 DIAGNOSIS — I4510 Unspecified right bundle-branch block: Secondary | ICD-10-CM | POA: Diagnosis not present

## 2023-03-12 DIAGNOSIS — J9611 Chronic respiratory failure with hypoxia: Secondary | ICD-10-CM | POA: Diagnosis not present

## 2023-03-12 DIAGNOSIS — E042 Nontoxic multinodular goiter: Secondary | ICD-10-CM | POA: Diagnosis not present

## 2023-03-12 DIAGNOSIS — I428 Other cardiomyopathies: Secondary | ICD-10-CM | POA: Diagnosis not present

## 2023-03-12 DIAGNOSIS — K219 Gastro-esophageal reflux disease without esophagitis: Secondary | ICD-10-CM | POA: Diagnosis not present

## 2023-03-12 DIAGNOSIS — E039 Hypothyroidism, unspecified: Secondary | ICD-10-CM | POA: Diagnosis not present

## 2023-03-12 DIAGNOSIS — Z9981 Dependence on supplemental oxygen: Secondary | ICD-10-CM | POA: Diagnosis not present

## 2023-03-12 DIAGNOSIS — J690 Pneumonitis due to inhalation of food and vomit: Secondary | ICD-10-CM | POA: Diagnosis not present

## 2023-03-12 NOTE — Progress Notes (Signed)
Remote pacemaker transmission.   

## 2023-03-13 ENCOUNTER — Ambulatory Visit: Payer: 59 | Attending: Family Medicine | Admitting: Family Medicine

## 2023-03-13 ENCOUNTER — Encounter: Payer: Self-pay | Admitting: Family Medicine

## 2023-03-13 VITALS — BP 112/73 | HR 82 | Ht 63.0 in | Wt 91.0 lb

## 2023-03-13 DIAGNOSIS — Z931 Gastrostomy status: Secondary | ICD-10-CM | POA: Insufficient documentation

## 2023-03-13 DIAGNOSIS — G7111 Myotonic muscular dystrophy: Secondary | ICD-10-CM | POA: Diagnosis not present

## 2023-03-13 DIAGNOSIS — Z950 Presence of cardiac pacemaker: Secondary | ICD-10-CM | POA: Diagnosis not present

## 2023-03-13 DIAGNOSIS — E890 Postprocedural hypothyroidism: Secondary | ICD-10-CM

## 2023-03-13 DIAGNOSIS — R197 Diarrhea, unspecified: Secondary | ICD-10-CM

## 2023-03-13 DIAGNOSIS — J189 Pneumonia, unspecified organism: Secondary | ICD-10-CM

## 2023-03-13 NOTE — Patient Instructions (Signed)
Diarrhea, Adult Diarrhea is frequent loose and sometimes watery bowel movements. Diarrhea can make you feel weak and cause you to become dehydrated. Dehydration is a condition in which there is not enough water or other fluids in the body. Dehydration can make you tired and thirsty, cause you to have a dry mouth, and decrease how often you urinate. Diarrhea typically lasts 2-3 days. However, it can last longer if it is a sign of something more serious. It is important to treat your diarrhea as told by your health care provider. Follow these instructions at home: Eating and drinking     Follow these recommendations as told by your health care provider: Take an oral rehydration solution (ORS). This is an over-the-counter medicine that helps return your body to its normal balance of nutrients and water. It is found at pharmacies and retail stores. Drink enough fluid to keep your urine pale yellow. Drink fluids such as water, diluted fruit juice, and low-calorie sports drinks. You can drink milk also, if desired. Sucking on ice chips is another way to get fluids. Avoid drinking fluids that contain a lot of sugar or caffeine, such as soda, energy drinks, and regular sports drinks. Avoid alcohol. Eat bland, easy-to-digest foods in small amounts as you are able. These foods include bananas, applesauce, rice, lean meats, toast, and crackers. Avoid spicy or fatty foods.  Medicines Take over-the-counter and prescription medicines only as told by your health care provider. If you were prescribed antibiotics, take them as told by your health care provider. Do not stop using the antibiotic even if you start to feel better. General instructions  Wash your hands often using soap and water for at least 20 seconds. If soap and water are not available, use hand sanitizer. Others in the household should wash their hands as well. Hands should be washed: After using the toilet or changing a diaper. Before  preparing, cooking, or serving food. While caring for a sick person or while visiting someone in a hospital. Rest at home while you recover. Take a warm bath to relieve any burning or pain from frequent diarrhea episodes. Watch your condition for any changes. Contact a health care provider if: You have a fever. Your diarrhea gets worse. You have new symptoms. You vomit every time you eat or drink. You feel light-headed, dizzy, or have a headache. You have muscle cramps. You have signs of dehydration, such as: Dark urine, very little urine, or no urine. Cracked lips. Dry mouth. Sunken eyes. Sleepiness. Weakness. You have bloody or black stools or stools that look like tar. You have severe pain, cramping, or bloating in your abdomen. Your skin feels cold and clammy. You feel confused. Get help right away if: You have chest pain or your heart is beating very quickly. You have trouble breathing or you are breathing very quickly. You feel extremely weak or you faint. These symptoms may be an emergency. Get help right away. Call 911. Do not wait to see if the symptoms will go away. Do not drive yourself to the hospital. This information is not intended to replace advice given to you by your health care provider. Make sure you discuss any questions you have with your health care provider. Document Revised: 02/07/2022 Document Reviewed: 02/07/2022 Elsevier Patient Education  2024 [employer]  

## 2023-03-13 NOTE — Progress Notes (Signed)
Referral to pulmonary and GI Discuss recent hospital stay

## 2023-03-13 NOTE — Progress Notes (Signed)
Subjective:  Patient ID: Cynthia Underwood, female    DOB: 07/18/1971  Age: 52 y.o. MRN: [id number]  CC: Hospitalization Follow-up   HPI Cynthia Underwood is a 52 y.o. 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  The initial hospitalization in 11/2022 when she presented with acute hypoxic respiratory failure secondary to community-acquired pneumonia with resulting V-fib arrest.  PEG tube and trach tube placed during hospitalization, trach site has since closed. Later discharged to a long-term facility after which she was readmitted again in 01/2023 with sepsis secondary to aspiration pneumonia.  Interval History: Discussed the use of AI scribe software for clinical note transcription with the patient, who gave verbal consent to proceed.  She presents with diarrhea and recurrent pneumonia. She report having a tracheostomy that has since been closed and is currently on tube feeds, although she are able to drink by mouth. She have been experiencing severe diarrhea, which started while she were in a long-term care facility. The diarrhea is watery and urgent, with no reported blood in the stool.  She would like a referral to GI for further evaluation.  The patient has a history of recurrent pneumonia, which has led to hospitalizations and a cardiac arrest. She express concern about the recurrent pneumonia and wish to see a pulmonologist to prevent further episodes.  She is scheduled for remote pacemaker interrogation in 2 months but is yet to see cardiology since her discharge.  Endorses adherence with her levothyroxine and she is also managed by neurology.  The patient also has a feeding tube, which is managed by a nurse who visits once  a week. She is currently receiving physical and speech therapy at home. She also report feeling weak, particularly after therapy sessions.        Past Medical History:  Diagnosis Date   Bifascicular block 10/02/2014   Cataract    Dyspnea    Excess ear wax    Multinodular goiter    Pneumonia    Presence of permanent cardiac pacemaker    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   Sickle cell trait (HCC)    Watery eyes    Left   Weakness of both legs     Past Surgical History:  Procedure Laterality Date   BREAST BIOPSY Right    EYE SURGERY Left 2017   "related to blockage in my nose"   EYE SURGERY Right 05/2019   INSERT / REPLACE / REMOVE PACEMAKER  02/15/2017   IR GASTROSTOMY TUBE MOD SED  01/01/2023   PACEMAKER IMPLANT N/A 02/15/2017   Procedure: Pacemaker Implant;  Surgeon: Duke Salvia, MD;  Location: [facility] INVASIVE CV LAB;  Service: Cardiovascular;  Laterality: N/A;   RIGHT/LEFT HEART CATH AND CORONARY ANGIOGRAPHY N/A 11/16/2022   Procedure: RIGHT/LEFT HEART CATH AND CORONARY ANGIOGRAPHY;  Surgeon: Laurey Morale, MD;  Location: [facility] INVASIVE CV LAB;  Service: Cardiovascular;  Laterality: N/A;   THYROIDECTOMY N/A 04/21/2021   Procedure: TOTAL THYROIDECTOMY;  Surgeon: Axel Filler, MD;  Location: [facility] OR;  Service: General;  Laterality: N/A;    Family History  Problem Relation Age of Onset   Cancer Father 56       Deceased   Heart disease Father    COPD Mother  Diabetes Maternal Uncle    Heart disease Maternal Uncle    Heart disease Paternal Grandmother    Diabetes Paternal Grandmother    Hypertension Paternal Grandmother    Neuromuscular disorder Maternal Aunt        Myotonic dystrophy   Neuromuscular disorder Cousin        Myotonic dystrophy   Neuromuscular disorder Sister        Myotonic dystrophy   Colon cancer Neg Hx    Colon polyps Neg Hx    Esophageal cancer Neg Hx    Rectal cancer Neg Hx    Stomach cancer Neg Hx      Social History   Socioeconomic History   Marital status: Single    Spouse name: Not on file   Number of children: 0   Years of education: 1.5 Collge   Highest education level: Not on file  Occupational History   Occupation: Unemployed    Employer: OTHER  Tobacco Use   Smoking status: Every Day    Years: 28    Types: Cigarettes    Last attempt to quit: 10/04/2016    Years since quitting: 6.4   Smokeless tobacco: Never   Tobacco comments:    3 cigarettes a day  Vaping Use   Vaping Use: Never used  Substance and Sexual Activity   Alcohol use: Not Currently    Alcohol/week: 0.0 standard drinks of alcohol    Comment:  few times a year   Drug use: Not Currently    Types: Marijuana    Comment: occ    Sexual activity: Not Currently    Birth control/protection: None, Condom  Other Topics Concern   Not on file  Social History Narrative   Patient lives at home with her aunt.   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  She moved to [facility] in August 2015.   She has been on disability since 2010.   Education: 1 1/2 years of college.   Caffeine - coke 2 cans/day   Exercise - no   Social Determinants of Health   Financial Resource Strain: Low Risk  (10/16/2022)   Overall Financial Resource Strain (CARDIA)    Difficulty of Paying Living Expenses: Not hard at all  Food Insecurity: No Food Insecurity (03/02/2023)   Hunger Vital Sign    Worried About Running Out of Food in the Last Year: Never true    Ran Out of Food in the Last Year: Never true  Transportation Needs: No Transportation Needs (03/02/2023)   PRAPARE - Administrator, Civil Service (Medical): No    Lack of Transportation (Non-Medical): No  Physical Activity: Inactive (10/16/2022)   Exercise Vital Sign    Days of Exercise per Week: 0 days    Minutes of Exercise per Session: 0 min  Stress: Stress Concern Present (10/16/2022)   [employer] of Occupational Health - Occupational  Stress Questionnaire    Feeling of Stress : To some extent  Social Connections: Socially Isolated (10/16/2022)   Social Connection and Isolation Panel [NHANES]    Frequency of Communication with Friends and Family: Three times a week    Frequency of Social Gatherings with Friends and Family: Three times a week    Attends Religious Services: Never    Active Member of Clubs or Organizations: No    Attends Banker Meetings: Never    Marital Status: Never married    Allergies  Allergen Reactions   Penicillin G  Other (See Comments), Rash and Nausea And Vomiting    Sore throat   Penicillins Nausea And Vomiting, Rash and Other (See Comments)    Fever Has patient had a PCN reaction causing immediate rash, facial/tongue/throat swelling, SOB or lightheadedness with hypotension: Yes Has patient had a PCN reaction causing severe rash involving mucus membranes or skin necrosis: Unknown Has patient had a PCN reaction that required hospitalization: No Has patient had a PCN reaction occurring within the last 10 years: No If all of the above answers are "NO", then may proceed with Cephalosporin use.     Outpatient Medications Prior to Visit  Medication Sig Dispense Refill   acetaminophen (TYLENOL) 500 MG tablet Take 500 mg by mouth every 6 (six) hours as needed for mild pain.     digoxin (LANOXIN) 0.25 MG tablet Place 0.25 mg into feeding tube daily.     guaifenesin (HUMIBID E) 400 MG TABS tablet Take 400 mg by mouth 3 (three) times daily.     levothyroxine (SYNTHROID) 112 MCG tablet Place 1 tablet (112 mcg total) into feeding tube daily before breakfast.     loperamide (IMODIUM) 2 MG capsule Take 1 capsule (2 mg total) by mouth every 6 (six) hours as needed for diarrhea or loose stools. 30 capsule 0   metoCLOPramide (REGLAN) 5 MG tablet Place 5 mg into feeding tube.     metoprolol tartrate (LOPRESSOR) 25 MG tablet Place 12.5 mg into feeding tube 2 (two) times daily.     Nutritional  Supplements (FEEDING SUPPLEMENT, JEVITY 1.5 CAL/FIBER,) LIQD Bolus Feeds: 237cc - Jevity 1.5  - Administer slowly, do not push fast - 30 mL free water flush before and after each bolus - Keep pt upright for 30-60 minutes after each bolus     pantoprazole (PROTONIX) 40 MG tablet Take 40 mg by mouth daily.     Protein (FEEDING SUPPLEMENT, PROSOURCE TF20,) liquid Place 60 mLs into feeding tube daily.     QUEtiapine (SEROQUEL) 25 MG tablet Place 1 tablet (25 mg total) into feeding tube at bedtime.     scopolamine (TRANSDERM-SCOP) 1 MG/3DAYS Place 1 patch onto the skin every 3 (three) days.     spironolactone (ALDACTONE) 25 MG tablet Place 0.5 tablets (12.5 mg total) into feeding tube daily.     Water For Irrigation, Sterile (FREE WATER) SOLN Place 150 mLs into feeding tube 3 (three) times daily.     No facility-administered medications prior to visit.     ROS Review of Systems  Constitutional:  Negative for activity change and appetite change.  HENT:  Positive for voice change. Negative for sinus pressure and sore throat.   Respiratory:  Negative for chest tightness, shortness of breath and wheezing.   Cardiovascular:  Negative for chest pain and palpitations.  Gastrointestinal:  Negative for abdominal distention, abdominal pain and constipation.  Genitourinary: Negative.   Musculoskeletal:  Positive for gait problem.  Psychiatric/Behavioral:  Negative for behavioral problems and dysphoric mood.     Objective:  BP 112/73   Pulse 82   Ht 5\' 3"  (1.6 m)   Wt 91 lb (41.3 kg)   LMP 12/03/2016 (Approximate)   SpO2 95%   BMI 16.12 kg/m      03/13/2023   11:13 AM 01/30/2023    8:00 AM 01/29/2023   11:33 PM  BP/Weight  Systolic BP 112 110 95  Diastolic BP 73 74 69  Wt. (Lbs) 91    BMI 16.12 kg/m2  Physical Exam Constitutional:      Appearance: She is well-developed.  Cardiovascular:     Rate and Rhythm: Normal rate.     Heart sounds: Normal heart sounds. No murmur  heard. Pulmonary:     Effort: Pulmonary effort is normal.     Breath sounds: Normal breath sounds. No wheezing or rales.  Chest:     Chest wall: No tenderness.  Abdominal:     General: Bowel sounds are normal. There is no distension.     Palpations: Abdomen is soft. There is no mass.     Tenderness: There is no abdominal tenderness.     Comments: Gastrostomy tube in place  Musculoskeletal:        General: Normal range of motion.     Right lower leg: No edema.     Left lower leg: No edema.  Neurological:     Mental Status: She is alert and oriented to person, place, and time.  Psychiatric:        Mood and Affect: Mood normal.        Latest Ref Rng & Units 01/30/2023    2:14 AM 01/27/2023    2:37 AM 01/26/2023    4:08 AM  CMP  Glucose 70 - 99 mg/dL 865  784  86   BUN 6 - 20 mg/dL 15  11  10    Creatinine 0.44 - 1.00 mg/dL 6.96  2.95  2.84   Sodium 135 - 145 mmol/L 137  142  141   Potassium 3.5 - 5.1 mmol/L 3.8  3.8  3.6   Chloride 98 - 111 mmol/L 99  103  103   CO2 22 - 32 mmol/L 28  32  30   Calcium 8.9 - 10.3 mg/dL 8.6  9.1  9.1     Lipid Panel     Component Value Date/Time   CHOL 214 (H) 01/10/2018 0847   TRIG 113 01/10/2018 0847   HDL 57 01/10/2018 0847   CHOLHDL 3.8 01/10/2018 0847   CHOLHDL 3.5 08/26/2014 1516   VLDL 20 08/26/2014 1516   LDLCALC 134 (H) 01/10/2018 0847    CBC    Component Value Date/Time   WBC 7.2 01/30/2023 0214   RBC 3.97 01/30/2023 0214   HGB 12.1 01/30/2023 0214   HGB 12.6 10/04/2022 1638   HCT 38.3 01/30/2023 0214   HCT 37.6 10/04/2022 1638   PLT 282 01/30/2023 0214   PLT 192 10/04/2022 1638   MCV 96.5 01/30/2023 0214   MCV 94 10/04/2022 1638   MCH 30.5 01/30/2023 0214   MCHC 31.6 01/30/2023 0214   RDW 15.8 (H) 01/30/2023 0214   RDW 15.1 10/04/2022 1638   LYMPHSABS 3.2 01/30/2023 0214   LYMPHSABS 2.1 10/04/2022 1638   MONOABS 0.6 01/30/2023 0214   EOSABS 0.3 01/30/2023 0214   EOSABS 0.1 10/04/2022 1638   BASOSABS 0.0  01/30/2023 0214   BASOSABS 0.0 10/04/2022 1638    Lab Results  Component Value Date   HGBA1C 5.6 11/15/2022    Lab Results  Component Value Date   TSH 1.978 11/11/2022    Assessment & Plan:      Recurrent Pneumonia:  History of multiple hospitalizations for pneumonia, most recently complicated by cardiac arrest and sepsis. Patient is concerned about recurrent pneumonia and wishes to prevent future episodes. -Order chest x-ray to assess for resolution of pneumonia. -Refer to pulmonologist for further evaluation and management.  Diarrhea:  Reports of persistent, watery diarrhea since stay in long-term care facility.  No reported blood in stool.  Discharge notes indicate possibility that this might be related to tube feeds. -Refer to gastroenterologist for further evaluation and management.  Feeding Tube Management: Patient is currently receiving tube feeds at home, with a nurse visiting once a week. -Continue current feeding regimen.  Cardiac Arrest: History of cardiac arrest in the hospital, reportedly secondary to pneumonia. Patient has a pacemaker, which has not been checked since prior to hospitalization.  She is scheduled for remote check in 2 months. -Recommend follow-up appointment with general cardiologist (Dr. Mayford Knife) for further evaluation and management.  Physical Therapy: Patient is currently receiving physical therapy at home, but reports feeling weak and experiencing pain after sessions. -Continue current therapy regimen. Encourage patient to discuss any concerns or difficulties with therapy team.      Postop hypothyroidism: She remains controlled on levothyroxine  Myotonic dystrophy: Currently under the care of neurology     No orders of the defined types were placed in this encounter.   Follow-up: Return for previously scheduled appointment.       Hoy Register, MD, FAAFP. [facility] [place], [place] [phone]    03/13/2023, 1:34 PM

## 2023-03-14 DIAGNOSIS — I5042 Chronic combined systolic (congestive) and diastolic (congestive) heart failure: Secondary | ICD-10-CM | POA: Diagnosis not present

## 2023-03-14 DIAGNOSIS — I4510 Unspecified right bundle-branch block: Secondary | ICD-10-CM | POA: Diagnosis not present

## 2023-03-14 DIAGNOSIS — J690 Pneumonitis due to inhalation of food and vomit: Secondary | ICD-10-CM | POA: Diagnosis not present

## 2023-03-14 DIAGNOSIS — Z9981 Dependence on supplemental oxygen: Secondary | ICD-10-CM | POA: Diagnosis not present

## 2023-03-14 DIAGNOSIS — J9611 Chronic respiratory failure with hypoxia: Secondary | ICD-10-CM | POA: Diagnosis not present

## 2023-03-14 DIAGNOSIS — K219 Gastro-esophageal reflux disease without esophagitis: Secondary | ICD-10-CM | POA: Diagnosis not present

## 2023-03-14 DIAGNOSIS — E042 Nontoxic multinodular goiter: Secondary | ICD-10-CM | POA: Diagnosis not present

## 2023-03-14 DIAGNOSIS — Z950 Presence of cardiac pacemaker: Secondary | ICD-10-CM | POA: Diagnosis not present

## 2023-03-14 DIAGNOSIS — I428 Other cardiomyopathies: Secondary | ICD-10-CM | POA: Diagnosis not present

## 2023-03-14 DIAGNOSIS — E039 Hypothyroidism, unspecified: Secondary | ICD-10-CM | POA: Diagnosis not present

## 2023-03-14 DIAGNOSIS — I444 Left anterior fascicular block: Secondary | ICD-10-CM | POA: Diagnosis not present

## 2023-03-15 ENCOUNTER — Ambulatory Visit: Payer: Self-pay

## 2023-03-16 NOTE — Patient Outreach (Signed)
  Care Coordination   Follow Up Visit Note   03/15/2023 Name: DESTA WILL MRN: [id number] DOB: 06-Oct-1970  Elder Love Bath is a 52 y.o. year old female who sees Hoy Register, MD for primary care. I spoke with sister Sookie Tona by phone today.  What matters to the patients health and wellness today?  Patient would like to regain her voice and improve her swallowing and the ability to eat/drink normally.     Goals Addressed             This Visit's Progress    To be able to eat and drink normally and regain my speech       Care Coordination Interventions: Completed outbound call with patient and sister Saki Gasparyan  Evaluation of current treatment plan related to Impaired Speech and Swallowing and patient's adherence to plan as established by provider Review of patient status, including review of consultant's reports, relevant laboratory and other test results, and medications completed Reviewed PCP referral status for Gastroenterology, noted referral is ready for scheduling, provided sister [name] the contact number and location for [facility] Gastroenterology, she will relay this information to her cousin who will call for a new patient appointment Determined patient is now working with Speech Therapy for voice, and swallowing  Instructed sister to keep patient's doctor well informed of new symptoms or concerns       To establish with a Pulmonologist for monitoring of recent pneumonia       Care Coordination Interventions: Completed successful outbound call with patient and sister [name]  Evaluation of current treatment plan related to aspirational pneumonia  and patient's adherence to plan as established by provider Review of patient status, including review of consultant's reports, relevant laboratory and other test results, and medications completed Reviewed PCP referral status for Pulmonology, noted referral is ready for scheduling, provided sister [name] the  contact number and location for [facility] Pulmonology, she will relay this information to her cousin who will call for a new patient appointment Instructed sister to keep patient's doctor well informed of new symptoms or concerns     Interventions Today    Flowsheet Row Most Recent Value  Chronic Disease   Chronic disease during today's visit Other  [Myotonic dystrophy type 1,  recurrent pneumonia]  General Interventions   General Interventions Discussed/Reviewed General Interventions Discussed, General Interventions Reviewed, Doctor Visits  Doctor Visits Discussed/Reviewed Doctor Visits Discussed, Doctor Visits Reviewed, PCP, Specialist  Education Interventions   Education Provided Provided Education  Provided Verbal Education On When to see the doctor          SDOH assessments and interventions completed:  No     Care Coordination Interventions:  Yes, provided   Follow up plan: Follow up call scheduled for 05/18/23 @2 :00 PM    Encounter Outcome:  Pt. Visit Completed

## 2023-03-16 NOTE — Patient Instructions (Signed)
Visit Information  Thank you for taking time to visit with me today. Please don't hesitate to contact me if I can be of assistance to you.   Following are the goals we discussed today:   Goals Addressed             This Visit's Progress    To be able to eat and drink normally and regain my speech       Care Coordination Interventions: Completed outbound call with patient and sister Deericka Lunny  Evaluation of current treatment plan related to Impaired Speech and Swallowing and patient's adherence to plan as established by provider Review of patient status, including review of consultant's reports, relevant laboratory and other test results, and medications completed Reviewed PCP referral status for Gastroenterology, noted referral is ready for scheduling, provided sister [name] the contact number and location for [facility] Gastroenterology, she will relay this information to her cousin who will call for a new patient appointment Determined patient is now working with Speech Therapy for voice, and swallowing  Instructed sister to keep patient's doctor well informed of new symptoms or concerns       To establish with a Pulmonologist for monitoring of recent pneumonia       Care Coordination Interventions: Completed successful outbound call with patient and sister [name]  Evaluation of current treatment plan related to aspirational pneumonia  and patient's adherence to plan as established by provider Review of patient status, including review of consultant's reports, relevant laboratory and other test results, and medications completed Reviewed PCP referral status for Pulmonology, noted referral is ready for scheduling, provided sister [name] the contact number and location for [facility] Pulmonology, she will relay this information to her cousin who will call for a new patient appointment Instructed sister to keep patient's doctor well informed of new symptoms or  concerns         Our next appointment is by telephone on 05/18/23 at 2:00 PM  Please call the care guide team at [phone] if you need to cancel or reschedule your appointment.   If you are experiencing a Mental Health or Behavioral Health Crisis or need someone to talk to, please call 1-800-273-TALK (toll free, 24 hour hotline)  Patient verbalizes understanding of instructions and care plan provided today and agrees to view in MyChart. Active MyChart status and patient understanding of how to access instructions and care plan via MyChart confirmed with patient.     [name], RN, BSN, CCM Care Management Coordinator [facility] Care Management Direct Phone: [phone]

## 2023-03-19 DIAGNOSIS — I4510 Unspecified right bundle-branch block: Secondary | ICD-10-CM | POA: Diagnosis not present

## 2023-03-19 DIAGNOSIS — E042 Nontoxic multinodular goiter: Secondary | ICD-10-CM | POA: Diagnosis not present

## 2023-03-19 DIAGNOSIS — J9611 Chronic respiratory failure with hypoxia: Secondary | ICD-10-CM | POA: Diagnosis not present

## 2023-03-19 DIAGNOSIS — I428 Other cardiomyopathies: Secondary | ICD-10-CM | POA: Diagnosis not present

## 2023-03-19 DIAGNOSIS — J690 Pneumonitis due to inhalation of food and vomit: Secondary | ICD-10-CM | POA: Diagnosis not present

## 2023-03-19 DIAGNOSIS — Z9981 Dependence on supplemental oxygen: Secondary | ICD-10-CM | POA: Diagnosis not present

## 2023-03-19 DIAGNOSIS — E039 Hypothyroidism, unspecified: Secondary | ICD-10-CM | POA: Diagnosis not present

## 2023-03-19 DIAGNOSIS — I5042 Chronic combined systolic (congestive) and diastolic (congestive) heart failure: Secondary | ICD-10-CM | POA: Diagnosis not present

## 2023-03-19 DIAGNOSIS — K219 Gastro-esophageal reflux disease without esophagitis: Secondary | ICD-10-CM | POA: Diagnosis not present

## 2023-03-19 DIAGNOSIS — I444 Left anterior fascicular block: Secondary | ICD-10-CM | POA: Diagnosis not present

## 2023-03-19 DIAGNOSIS — Z950 Presence of cardiac pacemaker: Secondary | ICD-10-CM | POA: Diagnosis not present

## 2023-03-22 DIAGNOSIS — I4510 Unspecified right bundle-branch block: Secondary | ICD-10-CM | POA: Diagnosis not present

## 2023-03-22 DIAGNOSIS — Z9981 Dependence on supplemental oxygen: Secondary | ICD-10-CM | POA: Diagnosis not present

## 2023-03-22 DIAGNOSIS — E039 Hypothyroidism, unspecified: Secondary | ICD-10-CM | POA: Diagnosis not present

## 2023-03-22 DIAGNOSIS — J9611 Chronic respiratory failure with hypoxia: Secondary | ICD-10-CM | POA: Diagnosis not present

## 2023-03-22 DIAGNOSIS — Z950 Presence of cardiac pacemaker: Secondary | ICD-10-CM | POA: Diagnosis not present

## 2023-03-22 DIAGNOSIS — J690 Pneumonitis due to inhalation of food and vomit: Secondary | ICD-10-CM | POA: Diagnosis not present

## 2023-03-22 DIAGNOSIS — I5042 Chronic combined systolic (congestive) and diastolic (congestive) heart failure: Secondary | ICD-10-CM | POA: Diagnosis not present

## 2023-03-22 DIAGNOSIS — I428 Other cardiomyopathies: Secondary | ICD-10-CM | POA: Diagnosis not present

## 2023-03-22 DIAGNOSIS — E042 Nontoxic multinodular goiter: Secondary | ICD-10-CM | POA: Diagnosis not present

## 2023-03-22 DIAGNOSIS — I444 Left anterior fascicular block: Secondary | ICD-10-CM | POA: Diagnosis not present

## 2023-03-22 DIAGNOSIS — K219 Gastro-esophageal reflux disease without esophagitis: Secondary | ICD-10-CM | POA: Diagnosis not present

## 2023-03-23 DIAGNOSIS — I4510 Unspecified right bundle-branch block: Secondary | ICD-10-CM | POA: Diagnosis not present

## 2023-03-23 DIAGNOSIS — Z9981 Dependence on supplemental oxygen: Secondary | ICD-10-CM | POA: Diagnosis not present

## 2023-03-23 DIAGNOSIS — J690 Pneumonitis due to inhalation of food and vomit: Secondary | ICD-10-CM | POA: Diagnosis not present

## 2023-03-23 DIAGNOSIS — I5042 Chronic combined systolic (congestive) and diastolic (congestive) heart failure: Secondary | ICD-10-CM | POA: Diagnosis not present

## 2023-03-23 DIAGNOSIS — E039 Hypothyroidism, unspecified: Secondary | ICD-10-CM | POA: Diagnosis not present

## 2023-03-23 DIAGNOSIS — I428 Other cardiomyopathies: Secondary | ICD-10-CM | POA: Diagnosis not present

## 2023-03-23 DIAGNOSIS — K219 Gastro-esophageal reflux disease without esophagitis: Secondary | ICD-10-CM | POA: Diagnosis not present

## 2023-03-23 DIAGNOSIS — E042 Nontoxic multinodular goiter: Secondary | ICD-10-CM | POA: Diagnosis not present

## 2023-03-23 DIAGNOSIS — I444 Left anterior fascicular block: Secondary | ICD-10-CM | POA: Diagnosis not present

## 2023-03-23 DIAGNOSIS — J9611 Chronic respiratory failure with hypoxia: Secondary | ICD-10-CM | POA: Diagnosis not present

## 2023-03-23 DIAGNOSIS — Z950 Presence of cardiac pacemaker: Secondary | ICD-10-CM | POA: Diagnosis not present

## 2023-03-26 DIAGNOSIS — I4510 Unspecified right bundle-branch block: Secondary | ICD-10-CM | POA: Diagnosis not present

## 2023-03-26 DIAGNOSIS — I444 Left anterior fascicular block: Secondary | ICD-10-CM | POA: Diagnosis not present

## 2023-03-26 DIAGNOSIS — Z9981 Dependence on supplemental oxygen: Secondary | ICD-10-CM | POA: Diagnosis not present

## 2023-03-26 DIAGNOSIS — J690 Pneumonitis due to inhalation of food and vomit: Secondary | ICD-10-CM | POA: Diagnosis not present

## 2023-03-26 DIAGNOSIS — I428 Other cardiomyopathies: Secondary | ICD-10-CM | POA: Diagnosis not present

## 2023-03-26 DIAGNOSIS — E042 Nontoxic multinodular goiter: Secondary | ICD-10-CM | POA: Diagnosis not present

## 2023-03-26 DIAGNOSIS — Z950 Presence of cardiac pacemaker: Secondary | ICD-10-CM | POA: Diagnosis not present

## 2023-03-26 DIAGNOSIS — J9611 Chronic respiratory failure with hypoxia: Secondary | ICD-10-CM | POA: Diagnosis not present

## 2023-03-26 DIAGNOSIS — E039 Hypothyroidism, unspecified: Secondary | ICD-10-CM | POA: Diagnosis not present

## 2023-03-26 DIAGNOSIS — K219 Gastro-esophageal reflux disease without esophagitis: Secondary | ICD-10-CM | POA: Diagnosis not present

## 2023-03-26 DIAGNOSIS — I5042 Chronic combined systolic (congestive) and diastolic (congestive) heart failure: Secondary | ICD-10-CM | POA: Diagnosis not present

## 2023-03-28 DIAGNOSIS — Z950 Presence of cardiac pacemaker: Secondary | ICD-10-CM | POA: Diagnosis not present

## 2023-03-28 DIAGNOSIS — I444 Left anterior fascicular block: Secondary | ICD-10-CM | POA: Diagnosis not present

## 2023-03-28 DIAGNOSIS — E042 Nontoxic multinodular goiter: Secondary | ICD-10-CM | POA: Diagnosis not present

## 2023-03-28 DIAGNOSIS — I4510 Unspecified right bundle-branch block: Secondary | ICD-10-CM | POA: Diagnosis not present

## 2023-03-28 DIAGNOSIS — K219 Gastro-esophageal reflux disease without esophagitis: Secondary | ICD-10-CM | POA: Diagnosis not present

## 2023-03-28 DIAGNOSIS — Z9981 Dependence on supplemental oxygen: Secondary | ICD-10-CM | POA: Diagnosis not present

## 2023-03-28 DIAGNOSIS — E039 Hypothyroidism, unspecified: Secondary | ICD-10-CM | POA: Diagnosis not present

## 2023-03-28 DIAGNOSIS — J9611 Chronic respiratory failure with hypoxia: Secondary | ICD-10-CM | POA: Diagnosis not present

## 2023-03-28 DIAGNOSIS — I428 Other cardiomyopathies: Secondary | ICD-10-CM | POA: Diagnosis not present

## 2023-03-28 DIAGNOSIS — J690 Pneumonitis due to inhalation of food and vomit: Secondary | ICD-10-CM | POA: Diagnosis not present

## 2023-03-28 DIAGNOSIS — I5042 Chronic combined systolic (congestive) and diastolic (congestive) heart failure: Secondary | ICD-10-CM | POA: Diagnosis not present

## 2023-03-29 ENCOUNTER — Telehealth: Payer: Self-pay

## 2023-03-29 NOTE — Telephone Encounter (Signed)
FYI

## 2023-03-29 NOTE — Telephone Encounter (Signed)
Copied from CRM [id number]. Topic: Referral - Status >> Mar 29, 2023 12:19 PM Ja-Kwan M wrote: Reason for CRM: Matilde Bash reports that pt has peg tube and she was referred to [facility] but they were told that they do not do anything with peg tubes. Melinda requests call back at [phone]

## 2023-03-30 ENCOUNTER — Telehealth: Payer: Self-pay | Admitting: Family Medicine

## 2023-03-30 NOTE — Telephone Encounter (Unsigned)
Home Health Verbal Orders - Caller/Agency: Mallory from [place] Callback Number: [phone] Requesting OT/PT/Skilled Nursing/Social Work/Speech Therapy: *** Frequency: *** --

## 2023-03-30 NOTE — Telephone Encounter (Signed)
The patient requested referral to GI due to diarrhea and not because of her PEG tube.

## 2023-03-30 NOTE — Telephone Encounter (Signed)
Pt called and given contact information to call and schedule at [facility]

## 2023-04-02 ENCOUNTER — Telehealth: Payer: Self-pay | Admitting: Family Medicine

## 2023-04-02 DIAGNOSIS — I5042 Chronic combined systolic (congestive) and diastolic (congestive) heart failure: Secondary | ICD-10-CM | POA: Diagnosis not present

## 2023-04-02 DIAGNOSIS — E042 Nontoxic multinodular goiter: Secondary | ICD-10-CM | POA: Diagnosis not present

## 2023-04-02 DIAGNOSIS — Z950 Presence of cardiac pacemaker: Secondary | ICD-10-CM | POA: Diagnosis not present

## 2023-04-02 DIAGNOSIS — I428 Other cardiomyopathies: Secondary | ICD-10-CM | POA: Diagnosis not present

## 2023-04-02 DIAGNOSIS — J690 Pneumonitis due to inhalation of food and vomit: Secondary | ICD-10-CM | POA: Diagnosis not present

## 2023-04-02 DIAGNOSIS — K219 Gastro-esophageal reflux disease without esophagitis: Secondary | ICD-10-CM | POA: Diagnosis not present

## 2023-04-02 DIAGNOSIS — Z9981 Dependence on supplemental oxygen: Secondary | ICD-10-CM | POA: Diagnosis not present

## 2023-04-02 DIAGNOSIS — J9611 Chronic respiratory failure with hypoxia: Secondary | ICD-10-CM | POA: Diagnosis not present

## 2023-04-02 DIAGNOSIS — I4510 Unspecified right bundle-branch block: Secondary | ICD-10-CM | POA: Diagnosis not present

## 2023-04-02 DIAGNOSIS — E039 Hypothyroidism, unspecified: Secondary | ICD-10-CM | POA: Diagnosis not present

## 2023-04-02 DIAGNOSIS — I444 Left anterior fascicular block: Secondary | ICD-10-CM | POA: Diagnosis not present

## 2023-04-02 NOTE — Telephone Encounter (Signed)
Alden with [facility] is requesting the provider to schedule a swallow test for the patient. If the provider can send an order over to [facility] so they can set it up.  Please assist patient further

## 2023-04-02 NOTE — Telephone Encounter (Signed)
Call placed to [facility] with [facility] to clarify the reason pt needs swallowing study. Unable to reach VM left

## 2023-04-04 ENCOUNTER — Telehealth: Payer: Self-pay | Admitting: Family Medicine

## 2023-04-04 DIAGNOSIS — I444 Left anterior fascicular block: Secondary | ICD-10-CM | POA: Diagnosis not present

## 2023-04-04 DIAGNOSIS — Z9981 Dependence on supplemental oxygen: Secondary | ICD-10-CM | POA: Diagnosis not present

## 2023-04-04 DIAGNOSIS — I5042 Chronic combined systolic (congestive) and diastolic (congestive) heart failure: Secondary | ICD-10-CM | POA: Diagnosis not present

## 2023-04-04 DIAGNOSIS — E042 Nontoxic multinodular goiter: Secondary | ICD-10-CM | POA: Diagnosis not present

## 2023-04-04 DIAGNOSIS — Z950 Presence of cardiac pacemaker: Secondary | ICD-10-CM | POA: Diagnosis not present

## 2023-04-04 DIAGNOSIS — J690 Pneumonitis due to inhalation of food and vomit: Secondary | ICD-10-CM | POA: Diagnosis not present

## 2023-04-04 DIAGNOSIS — I428 Other cardiomyopathies: Secondary | ICD-10-CM | POA: Diagnosis not present

## 2023-04-04 DIAGNOSIS — E039 Hypothyroidism, unspecified: Secondary | ICD-10-CM | POA: Diagnosis not present

## 2023-04-04 DIAGNOSIS — K219 Gastro-esophageal reflux disease without esophagitis: Secondary | ICD-10-CM | POA: Diagnosis not present

## 2023-04-04 DIAGNOSIS — I4510 Unspecified right bundle-branch block: Secondary | ICD-10-CM | POA: Diagnosis not present

## 2023-04-04 DIAGNOSIS — J9611 Chronic respiratory failure with hypoxia: Secondary | ICD-10-CM | POA: Diagnosis not present

## 2023-04-04 NOTE — Telephone Encounter (Signed)
Copied from CRM [id number]. Topic: Quick Communication - Home Health Verbal Orders >> Apr 04, 2023  2:49 PM Everette C wrote: Caller/Agency: Mallory / [facility]  Callback Number: [phone] Requesting OT/PT/Skilled Nursing/Social Work/Speech Therapy: OT  Frequency: 1w3

## 2023-04-05 NOTE — Telephone Encounter (Signed)
Cynthia Underwood returned call and was given verbal orders.

## 2023-04-05 NOTE — Telephone Encounter (Signed)
Attempted to contact Cynthia Underwood and VM did not state that is was secure so no VM was left.

## 2023-04-17 DIAGNOSIS — I4510 Unspecified right bundle-branch block: Secondary | ICD-10-CM | POA: Diagnosis not present

## 2023-04-17 DIAGNOSIS — Z950 Presence of cardiac pacemaker: Secondary | ICD-10-CM | POA: Diagnosis not present

## 2023-04-17 DIAGNOSIS — Z9981 Dependence on supplemental oxygen: Secondary | ICD-10-CM | POA: Diagnosis not present

## 2023-04-17 DIAGNOSIS — J690 Pneumonitis due to inhalation of food and vomit: Secondary | ICD-10-CM | POA: Diagnosis not present

## 2023-04-17 DIAGNOSIS — E039 Hypothyroidism, unspecified: Secondary | ICD-10-CM | POA: Diagnosis not present

## 2023-04-17 DIAGNOSIS — K219 Gastro-esophageal reflux disease without esophagitis: Secondary | ICD-10-CM | POA: Diagnosis not present

## 2023-04-17 DIAGNOSIS — J9611 Chronic respiratory failure with hypoxia: Secondary | ICD-10-CM | POA: Diagnosis not present

## 2023-04-17 DIAGNOSIS — E042 Nontoxic multinodular goiter: Secondary | ICD-10-CM | POA: Diagnosis not present

## 2023-04-17 DIAGNOSIS — I444 Left anterior fascicular block: Secondary | ICD-10-CM | POA: Diagnosis not present

## 2023-04-17 DIAGNOSIS — I428 Other cardiomyopathies: Secondary | ICD-10-CM | POA: Diagnosis not present

## 2023-04-17 DIAGNOSIS — I5042 Chronic combined systolic (congestive) and diastolic (congestive) heart failure: Secondary | ICD-10-CM | POA: Diagnosis not present

## 2023-04-18 ENCOUNTER — Encounter: Payer: Self-pay | Admitting: Pulmonary Disease

## 2023-04-18 ENCOUNTER — Ambulatory Visit: Payer: 59 | Admitting: Pulmonary Disease

## 2023-04-18 VITALS — BP 104/64 | HR 73 | Temp 97.4°F | Ht 63.0 in | Wt 87.2 lb

## 2023-04-18 DIAGNOSIS — J479 Bronchiectasis, uncomplicated: Secondary | ICD-10-CM

## 2023-04-18 DIAGNOSIS — R1310 Dysphagia, unspecified: Secondary | ICD-10-CM | POA: Diagnosis not present

## 2023-04-18 DIAGNOSIS — Z789 Other specified health status: Secondary | ICD-10-CM

## 2023-04-18 NOTE — Patient Instructions (Addendum)
Use duonebs 2-3 times per day followed by flutter valve therapy  Use incentive spirometer 2-3 times per day  You appear to have bronchiectasis based on your recent CT Chest.  We will schedule you for a CT Chest in 3 months to determine the extent of bronchiectasis after your hospital stay  Follow up in 3 months

## 2023-04-18 NOTE — Progress Notes (Signed)
Synopsis: Referred in August 2024 for hospital follow up  Subjective:   PATIENT ID: Cynthia Underwood GENDER: female DOB: April 14, 1971, MRN: [id number]  HPI  Chief Complaint  Patient presents with   Consult    Chronic pneumonia  COPD    Cynthia Underwood is a 52 year old woman, former smoker with myotonic dystrophy, second degree heart block s/p pacemaker, HFrEF (EF 20%), goiter s/p thyroidectomy who is referred to pulmonary clinic for hospital follow up of pneumonia.   Patient has had 3 admissions this year for respiratory failure due to pneumonia and sepsis. She has dysphagia with PEG tube in place but developed recurrent pneumonias in setting of aspiration. She is currently doing better from a respiratory standpoint but continues to have cough and mucous production. Denies and fevers, chills or sweats. She has not been able to gain weight back since being in the hospital. She is doing bolus feeding via PEG tube. She is not following with a nutritionist/dietician. She is accompanied by her cousin.   She quit smoking in 2018. She lives with her cousin.  Past Medical History:  Diagnosis Date   Bifascicular block 10/02/2014   Cataract    Dyspnea    Excess ear wax    Multinodular goiter    Pneumonia    Presence of permanent cardiac pacemaker    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   Sickle cell trait (HCC)    Watery eyes    Left   Weakness of both legs      Family History  Problem Relation Age of Onset   Cancer Father 28       Deceased   Heart disease Father    COPD Mother    Diabetes Maternal Uncle    Heart disease Maternal Uncle    Heart disease Paternal Grandmother    Diabetes Paternal Grandmother    Hypertension Paternal Grandmother    Neuromuscular disorder Maternal Aunt        Myotonic dystrophy   Neuromuscular disorder Cousin        Myotonic dystrophy   Neuromuscular disorder Sister        Myotonic dystrophy   Colon  cancer Neg Hx    Colon polyps Neg Hx    Esophageal cancer Neg Hx    Rectal cancer Neg Hx    Stomach cancer Neg Hx      Social History   Socioeconomic History   Marital status: Single    Spouse name: Not on file   Number of children: 0   Years of education: 1.5 Collge   Highest education level: Not on file  Occupational History   Occupation: Unemployed    Employer: OTHER  Tobacco Use   Smoking status: Former    Current packs/day: 0.00    Types: Cigarettes    Start date: 10/04/1988    Quit date: 10/04/2016    Years since quitting: 6.5   Smokeless tobacco: Never  Vaping Use   Vaping status: Never Used  Substance and Sexual Activity   Alcohol use: Not Currently    Alcohol/week: 0.0 standard drinks of alcohol    Comment:  few times a year   Drug use: Not Currently    Types: Marijuana    Comment: occ    Sexual activity: Not Currently    Birth control/protection: None, Condom  Other Topics Concern   Not on file  Social History Narrative   Patient lives at home with her aunt.  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  She moved to [facility] in August 2015.   She has been on disability since 2010.   Education: 1 1/2 years of college.   Caffeine - coke 2 cans/day   Exercise - no   Social Determinants of Health   Financial Resource Strain: Low Risk  (10/16/2022)   Overall Financial Resource Strain (CARDIA)    Difficulty of Paying Living Expenses: Not hard at all  Food Insecurity: No Food Insecurity (03/02/2023)   Hunger Vital Sign    Worried About Running Out of Food in the Last Year: Never true    Ran Out of Food in the Last Year: Never true  Transportation Needs: No Transportation Needs (03/02/2023)   PRAPARE - Administrator, Civil Service (Medical): No    Lack of Transportation (Non-Medical): No  Physical Activity: Inactive (10/16/2022)   Exercise Vital Sign    Days of Exercise per Week: 0 days    Minutes of Exercise per Session: 0 min   Stress: Stress Concern Present (10/16/2022)   [employer] of Occupational Health - Occupational Stress Questionnaire    Feeling of Stress : To some extent  Social Connections: Socially Isolated (10/16/2022)   Social Connection and Isolation Panel [NHANES]    Frequency of Communication with Friends and Family: Three times a week    Frequency of Social Gatherings with Friends and Family: Three times a week    Attends Religious Services: Never    Active Member of Clubs or Organizations: No    Attends Banker Meetings: Never    Marital Status: Never married  Intimate Partner Violence: Not At Risk (01/23/2023)   Humiliation, Afraid, Rape, and Kick questionnaire    Fear of Current or Ex-Partner: No    Emotionally Abused: No    Physically Abused: No    Sexually Abused: No     Allergies  Allergen Reactions   Penicillin G Other (See Comments), Rash and Nausea And Vomiting    Sore throat   Penicillins Nausea And Vomiting, Rash and Other (See Comments)    Fever Has patient had a PCN reaction causing immediate rash, facial/tongue/throat swelling, SOB or lightheadedness with hypotension: Yes Has patient had a PCN reaction causing severe rash involving mucus membranes or skin necrosis: Unknown Has patient had a PCN reaction that required hospitalization: No Has patient had a PCN reaction occurring within the last 10 years: No If all of the above answers are "NO", then may proceed with Cephalosporin use.      Outpatient Medications Prior to Visit  Medication Sig Dispense Refill   acetaminophen (TYLENOL) 500 MG tablet Take 500 mg by mouth every 6 (six) hours as needed for mild pain.     guaifenesin (HUMIBID E) 400 MG TABS tablet Take 400 mg by mouth 3 (three) times daily.     ipratropium-albuterol (DUONEB) 0.5-2.5 (3) MG/3ML SOLN SMARTSIG:1 Unspecified Via Inhaler Every 4 Hours PRN     levothyroxine (SYNTHROID) 112 MCG tablet Place 1 tablet (112 mcg total) into feeding tube  daily before breakfast.     loperamide (IMODIUM) 2 MG capsule Take 1 capsule (2 mg total) by mouth every 6 (six) hours as needed for diarrhea or loose stools. 30 capsule 0   metoCLOPramide (REGLAN) 5 MG tablet Place 5 mg into feeding tube.     metoprolol tartrate (LOPRESSOR) 25 MG tablet Place 12.5 mg into feeding tube 2 (two) times daily.  Nutritional Supplements (FEEDING SUPPLEMENT, JEVITY 1.5 CAL/FIBER,) LIQD Bolus Feeds: 237cc - Jevity 1.5  - Administer slowly, do not push fast - 30 mL free water flush before and after each bolus - Keep pt upright for 30-60 minutes after each bolus     pantoprazole (PROTONIX) 40 MG tablet Take 40 mg by mouth daily.     Protein (FEEDING SUPPLEMENT, PROSOURCE TF20,) liquid Place 60 mLs into feeding tube daily.     QUEtiapine (SEROQUEL) 25 MG tablet Place 1 tablet (25 mg total) into feeding tube at bedtime.     scopolamine (TRANSDERM-SCOP) 1 MG/3DAYS Place 1 patch onto the skin every 3 (three) days.     spironolactone (ALDACTONE) 25 MG tablet Place 0.5 tablets (12.5 mg total) into feeding tube daily.     traMADol (ULTRAM) 50 MG tablet Take 50 mg by mouth every 8 (eight) hours as needed.     Water For Irrigation, Sterile (FREE WATER) SOLN Place 150 mLs into feeding tube 3 (three) times daily.     No facility-administered medications prior to visit.    Review of Systems  Constitutional:  Negative for chills, fever, malaise/fatigue and weight loss.  HENT:  Negative for congestion, sinus pain and sore throat.   Eyes: Negative.   Respiratory:  Positive for cough, sputum production and shortness of breath. Negative for hemoptysis and wheezing.   Cardiovascular:  Negative for chest pain, palpitations, orthopnea, claudication and leg swelling.  Gastrointestinal:  Negative for abdominal pain, heartburn, nausea and vomiting.  Genitourinary: Negative.   Musculoskeletal:  Negative for joint pain and myalgias.  Skin:  Negative for rash.  Neurological:  Negative  for weakness.  Endo/Heme/Allergies: Negative.   Psychiatric/Behavioral: Negative.     Objective:   Vitals:   04/18/23 1454  BP: 104/64  Pulse: 73  Temp: (!) 97.4 F (36.3 C)  TempSrc: Temporal  SpO2: 93%  Weight: 87 lb 3.2 oz (39.6 kg)  Height: 5\' 3"  (1.6 m)   Physical Exam Constitutional:      General: She is not in acute distress.    Appearance: She is underweight.  Eyes:     General: No scleral icterus.    Conjunctiva/sclera: Conjunctivae normal.  Cardiovascular:     Rate and Rhythm: Normal rate and regular rhythm.  Pulmonary:     Breath sounds: No wheezing, rhonchi or rales.  Abdominal:     Palpations: Abdomen is soft.     Comments: PEG in place  Musculoskeletal:     Right lower leg: No edema.     Left lower leg: No edema.  Skin:    General: Skin is warm and dry.  Neurological:     General: No focal deficit present.    CBC    Component Value Date/Time   WBC 7.2 01/30/2023 0214   RBC 3.97 01/30/2023 0214   HGB 12.1 01/30/2023 0214   HGB 12.6 10/04/2022 1638   HCT 38.3 01/30/2023 0214   HCT 37.6 10/04/2022 1638   PLT 282 01/30/2023 0214   PLT 192 10/04/2022 1638   MCV 96.5 01/30/2023 0214   MCV 94 10/04/2022 1638   MCH 30.5 01/30/2023 0214   MCHC 31.6 01/30/2023 0214   RDW 15.8 (H) 01/30/2023 0214   RDW 15.1 10/04/2022 1638   LYMPHSABS 3.2 01/30/2023 0214   LYMPHSABS 2.1 10/04/2022 1638   MONOABS 0.6 01/30/2023 0214   EOSABS 0.3 01/30/2023 0214   EOSABS 0.1 10/04/2022 1638   BASOSABS 0.0 01/30/2023 0214   BASOSABS 0.0 10/04/2022 1638  Latest Ref Rng & Units 01/30/2023    2:14 AM 01/27/2023    2:37 AM 01/26/2023    4:08 AM  BMP  Glucose 70 - 99 mg/dL 829  562  86   BUN 6 - 20 mg/dL 15  11  10    Creatinine 0.44 - 1.00 mg/dL 1.30  8.65  7.84   Sodium 135 - 145 mmol/L 137  142  141   Potassium 3.5 - 5.1 mmol/L 3.8  3.8  3.6   Chloride 98 - 111 mmol/L 99  103  103   CO2 22 - 32 mmol/L 28  32  30   Calcium 8.9 - 10.3 mg/dL 8.6  9.1  9.1     Chest imaging: CXR 01/27/23 Bilateral lower lobe collapse, with slightly improved aeration in the periphery of the right lower lobe from prior. Possible small right pleural effusion.  CT Abd/pelvis 01/23/23 Lower chest: There is persistent dense collapse and consolidation of the lower lobes bilaterally with marked volume loss. Again noted is bronchiectasis and multifocal airway impaction within the collapsed segments. While this appears stable since prior examination, this is new since remote prior examination of 11/11/2022. Pacemaker leads noted within the right heart. Cardiac size within normal limits.  CT Abd 12/26/22 Lower chest: Substantial airspace opacities and bronchiectasis in both lower lobes with associated volume loss. There is also atelectasis in the right middle lobe cylindrical bronchiectasis. Dual lead pacer noted. A tube in the esophagus appears to terminate in the distal esophagus.  PFT:     No data to display          Labs:  Path:  Echo:  Heart Catheterization:  Speech Evaluation 01/25/23 Clinical Impression: Pt presents with a severe dysphagia that is primarily related to her dx of myotonic dystrophy type 1 with potential secondary effects from her 2022 total thyroidectomy. The swallow is impacted by nearly absent pharyngeal stripping wave and minimal base-of-tongue retraction. There is reduced laryngeal elevation. These factors lead to minimal movement of POs through the pharynx. She was able to protect her airway surprisingly well, with only high/transient penetration of thin liquids into the laryngeal vestibule, no aspiration, but repetitive effort and subswallows necessary in order to transfer small portions of a single bolus through her UES. There was almost total stasis of purees in pharynx, requiring oral suctioning and great effort to expectorate/remove barium. Continue NPO andTF as primary source of nutrition; allow small sips of water/ice chips per  pt's preferences. With therapy, she may be able to drink larger volumes of thin liquid in the future. SLP will follow while admiited; she will need intensive therapy after discharg      Assessment & Plan:   Bronchiectasis without complication (HCC) - Plan: CT Chest Wo Contrast  Dysphagia, unspecified type  On tube feeding diet - Plan: Ambulatory Referral to DSME/T  Discussion: Cynthia Underwood is a 52 year old woman, former smoker with myotonic dystrophy, second degree heart block s/p pacemaker, HFrEF (EF 20%), goiter s/p thyroidectomy who is referred to pulmonary clinic for hospital follow up of pneumonia.  She has history of recurrent pneumonia due to dysphagia leading to aspiration. She currently has PEG tube and is using tube feeds. She remains significantly under weight and I will refer her to the dietician team for further evaluation and adjustment of tube feeds as necessary.   Due to her recurrent aspiration and pneumonias, she has developed bronchiectasis of the bilateral lower lobes. She is to start duonebs 2-3 times  per day followed by flutter valve therapy for bronchial hygiene. She can use incentive spirometer 2-3 times per day.   We will schedule her for dedicated chest ct in 2-3 months for determining new baseline of her lungs after this years hospitaliazations.  Follow up in 3 months.   Melody Comas, MD [facility] Office: [phone]   Current Outpatient Medications:    acetaminophen (TYLENOL) 500 MG tablet, Take 500 mg by mouth every 6 (six) hours as needed for mild pain., Disp: , Rfl:    guaifenesin (HUMIBID E) 400 MG TABS tablet, Take 400 mg by mouth 3 (three) times daily., Disp: , Rfl:    ipratropium-albuterol (DUONEB) 0.5-2.5 (3) MG/3ML SOLN, SMARTSIG:1 Unspecified Via Inhaler Every 4 Hours PRN, Disp: , Rfl:    levothyroxine (SYNTHROID) 112 MCG tablet, Place 1 tablet (112 mcg total) into feeding tube daily before breakfast., Disp: , Rfl:     loperamide (IMODIUM) 2 MG capsule, Take 1 capsule (2 mg total) by mouth every 6 (six) hours as needed for diarrhea or loose stools., Disp: 30 capsule, Rfl: 0   metoCLOPramide (REGLAN) 5 MG tablet, Place 5 mg into feeding tube., Disp: , Rfl:    metoprolol tartrate (LOPRESSOR) 25 MG tablet, Place 12.5 mg into feeding tube 2 (two) times daily., Disp: , Rfl:    Nutritional Supplements (FEEDING SUPPLEMENT, JEVITY 1.5 CAL/FIBER,) LIQD, Bolus Feeds: 237cc - Jevity 1.5  - Administer slowly, do not push fast - 30 mL free water flush before and after each bolus - Keep pt upright for 30-60 minutes after each bolus, Disp: , Rfl:    pantoprazole (PROTONIX) 40 MG tablet, Take 40 mg by mouth daily., Disp: , Rfl:    Protein (FEEDING SUPPLEMENT, PROSOURCE TF20,) liquid, Place 60 mLs into feeding tube daily., Disp: , Rfl:    QUEtiapine (SEROQUEL) 25 MG tablet, Place 1 tablet (25 mg total) into feeding tube at bedtime., Disp: , Rfl:    scopolamine (TRANSDERM-SCOP) 1 MG/3DAYS, Place 1 patch onto the skin every 3 (three) days., Disp: , Rfl:    spironolactone (ALDACTONE) 25 MG tablet, Place 0.5 tablets (12.5 mg total) into feeding tube daily., Disp: , Rfl:    traMADol (ULTRAM) 50 MG tablet, Take 50 mg by mouth every 8 (eight) hours as needed., Disp: , Rfl:    Water For Irrigation, Sterile (FREE WATER) SOLN, Place 150 mLs into feeding tube 3 (three) times daily., Disp: , Rfl:

## 2023-04-19 ENCOUNTER — Telehealth: Payer: Self-pay

## 2023-04-19 DIAGNOSIS — J9611 Chronic respiratory failure with hypoxia: Secondary | ICD-10-CM | POA: Diagnosis not present

## 2023-04-19 DIAGNOSIS — I5042 Chronic combined systolic (congestive) and diastolic (congestive) heart failure: Secondary | ICD-10-CM | POA: Diagnosis not present

## 2023-04-19 DIAGNOSIS — J690 Pneumonitis due to inhalation of food and vomit: Secondary | ICD-10-CM | POA: Diagnosis not present

## 2023-04-19 DIAGNOSIS — I428 Other cardiomyopathies: Secondary | ICD-10-CM | POA: Diagnosis not present

## 2023-04-19 DIAGNOSIS — K219 Gastro-esophageal reflux disease without esophagitis: Secondary | ICD-10-CM | POA: Diagnosis not present

## 2023-04-19 DIAGNOSIS — I4510 Unspecified right bundle-branch block: Secondary | ICD-10-CM | POA: Diagnosis not present

## 2023-04-19 DIAGNOSIS — E042 Nontoxic multinodular goiter: Secondary | ICD-10-CM | POA: Diagnosis not present

## 2023-04-19 DIAGNOSIS — Z950 Presence of cardiac pacemaker: Secondary | ICD-10-CM | POA: Diagnosis not present

## 2023-04-19 DIAGNOSIS — Z9981 Dependence on supplemental oxygen: Secondary | ICD-10-CM | POA: Diagnosis not present

## 2023-04-19 DIAGNOSIS — E039 Hypothyroidism, unspecified: Secondary | ICD-10-CM | POA: Diagnosis not present

## 2023-04-19 DIAGNOSIS — I444 Left anterior fascicular block: Secondary | ICD-10-CM | POA: Diagnosis not present

## 2023-04-19 NOTE — Telephone Encounter (Signed)
Cynthia Underwood was called and given verbal orders for patient.

## 2023-04-19 NOTE — Telephone Encounter (Signed)
Copied from CRM [id number]. Topic: General - Other >> Apr 19, 2023  2:56 PM Santiya F wrote: Reason for CRM: Mallori with [facility] is calling in to request an extension for pt's OT for 1 week 4.

## 2023-04-21 DIAGNOSIS — J9601 Acute respiratory failure with hypoxia: Secondary | ICD-10-CM | POA: Diagnosis not present

## 2023-04-21 DIAGNOSIS — I5043 Acute on chronic combined systolic (congestive) and diastolic (congestive) heart failure: Secondary | ICD-10-CM | POA: Diagnosis not present

## 2023-04-22 ENCOUNTER — Encounter: Payer: Self-pay | Admitting: Pulmonary Disease

## 2023-04-27 DIAGNOSIS — R1312 Dysphagia, oropharyngeal phase: Secondary | ICD-10-CM | POA: Diagnosis not present

## 2023-04-27 DIAGNOSIS — E042 Nontoxic multinodular goiter: Secondary | ICD-10-CM | POA: Diagnosis not present

## 2023-04-27 DIAGNOSIS — I5042 Chronic combined systolic (congestive) and diastolic (congestive) heart failure: Secondary | ICD-10-CM | POA: Diagnosis not present

## 2023-04-27 DIAGNOSIS — E039 Hypothyroidism, unspecified: Secondary | ICD-10-CM | POA: Diagnosis not present

## 2023-04-27 DIAGNOSIS — Z9981 Dependence on supplemental oxygen: Secondary | ICD-10-CM | POA: Diagnosis not present

## 2023-04-27 DIAGNOSIS — I444 Left anterior fascicular block: Secondary | ICD-10-CM | POA: Diagnosis not present

## 2023-04-27 DIAGNOSIS — J690 Pneumonitis due to inhalation of food and vomit: Secondary | ICD-10-CM | POA: Diagnosis not present

## 2023-04-27 DIAGNOSIS — J9611 Chronic respiratory failure with hypoxia: Secondary | ICD-10-CM | POA: Diagnosis not present

## 2023-04-27 DIAGNOSIS — Z950 Presence of cardiac pacemaker: Secondary | ICD-10-CM | POA: Diagnosis not present

## 2023-04-27 DIAGNOSIS — I4510 Unspecified right bundle-branch block: Secondary | ICD-10-CM | POA: Diagnosis not present

## 2023-04-27 DIAGNOSIS — K219 Gastro-esophageal reflux disease without esophagitis: Secondary | ICD-10-CM | POA: Diagnosis not present

## 2023-04-27 DIAGNOSIS — I428 Other cardiomyopathies: Secondary | ICD-10-CM | POA: Diagnosis not present

## 2023-05-01 ENCOUNTER — Telehealth: Payer: Self-pay | Admitting: Pulmonary Disease

## 2023-05-01 DIAGNOSIS — J690 Pneumonitis due to inhalation of food and vomit: Secondary | ICD-10-CM | POA: Diagnosis not present

## 2023-05-01 DIAGNOSIS — I5042 Chronic combined systolic (congestive) and diastolic (congestive) heart failure: Secondary | ICD-10-CM | POA: Diagnosis not present

## 2023-05-01 DIAGNOSIS — R1312 Dysphagia, oropharyngeal phase: Secondary | ICD-10-CM | POA: Diagnosis not present

## 2023-05-01 DIAGNOSIS — I4510 Unspecified right bundle-branch block: Secondary | ICD-10-CM | POA: Diagnosis not present

## 2023-05-01 DIAGNOSIS — J479 Bronchiectasis, uncomplicated: Secondary | ICD-10-CM

## 2023-05-01 DIAGNOSIS — E039 Hypothyroidism, unspecified: Secondary | ICD-10-CM | POA: Diagnosis not present

## 2023-05-01 DIAGNOSIS — Z950 Presence of cardiac pacemaker: Secondary | ICD-10-CM | POA: Diagnosis not present

## 2023-05-01 DIAGNOSIS — J9611 Chronic respiratory failure with hypoxia: Secondary | ICD-10-CM | POA: Diagnosis not present

## 2023-05-01 DIAGNOSIS — I428 Other cardiomyopathies: Secondary | ICD-10-CM | POA: Diagnosis not present

## 2023-05-01 DIAGNOSIS — K219 Gastro-esophageal reflux disease without esophagitis: Secondary | ICD-10-CM | POA: Diagnosis not present

## 2023-05-01 DIAGNOSIS — I444 Left anterior fascicular block: Secondary | ICD-10-CM | POA: Diagnosis not present

## 2023-05-01 DIAGNOSIS — Z9981 Dependence on supplemental oxygen: Secondary | ICD-10-CM | POA: Diagnosis not present

## 2023-05-01 DIAGNOSIS — E042 Nontoxic multinodular goiter: Secondary | ICD-10-CM | POA: Diagnosis not present

## 2023-05-01 NOTE — Telephone Encounter (Signed)
Pt. Calling to let Dr. Francine Graven know she needs the referral to go to [facility] and needs more neb. [facility] [address]

## 2023-05-04 ENCOUNTER — Other Ambulatory Visit: Payer: Self-pay

## 2023-05-04 ENCOUNTER — Encounter (HOSPITAL_COMMUNITY): Payer: Self-pay

## 2023-05-04 ENCOUNTER — Emergency Department (HOSPITAL_COMMUNITY)
Admission: EM | Admit: 2023-05-04 | Discharge: 2023-05-04 | Disposition: A | Discharge status: Home / Self Care | Payer: 59 | Source: Home / Self Care | Attending: Emergency Medicine | Admitting: Emergency Medicine

## 2023-05-04 DIAGNOSIS — K5900 Constipation, unspecified: Secondary | ICD-10-CM | POA: Diagnosis not present

## 2023-05-04 DIAGNOSIS — E876 Hypokalemia: Secondary | ICD-10-CM | POA: Diagnosis not present

## 2023-05-04 DIAGNOSIS — Z79899 Other long term (current) drug therapy: Secondary | ICD-10-CM | POA: Insufficient documentation

## 2023-05-04 DIAGNOSIS — K625 Hemorrhage of anus and rectum: Secondary | ICD-10-CM

## 2023-05-04 LAB — CBC WITH DIFFERENTIAL/PLATELET
Abs Immature Granulocytes: 0.03 10*3/uL (ref 0.00–0.07)
Basophils Absolute: 0 10*3/uL (ref 0.0–0.1)
Basophils Relative: 0 %
Eosinophils Absolute: 0 10*3/uL (ref 0.0–0.5)
Eosinophils Relative: 0 %
HCT: 45.9 % (ref 36.0–46.0)
Hemoglobin: 14 g/dL (ref 12.0–15.0)
Immature Granulocytes: 0 %
Lymphocytes Relative: 12 %
Lymphs Abs: 1.3 10*3/uL (ref 0.7–4.0)
MCH: 29.8 pg (ref 26.0–34.0)
MCHC: 30.5 g/dL (ref 30.0–36.0)
MCV: 97.7 fL (ref 80.0–100.0)
Monocytes Absolute: 0.4 10*3/uL (ref 0.1–1.0)
Monocytes Relative: 3 %
Neutro Abs: 8.9 10*3/uL — ABNORMAL HIGH (ref 1.7–7.7)
Neutrophils Relative %: 85 %
Platelets: 198 10*3/uL (ref 150–400)
RBC: 4.7 MIL/uL (ref 3.87–5.11)
RDW: 14.1 % (ref 11.5–15.5)
WBC: 10.7 10*3/uL — ABNORMAL HIGH (ref 4.0–10.5)
nRBC: 0 % (ref 0.0–0.2)

## 2023-05-04 LAB — URINALYSIS, ROUTINE W REFLEX MICROSCOPIC
Bilirubin Urine: NEGATIVE
Glucose, UA: NEGATIVE mg/dL
Hgb urine dipstick: NEGATIVE
Ketones, ur: NEGATIVE mg/dL
Leukocytes,Ua: NEGATIVE
Nitrite: NEGATIVE
Protein, ur: NEGATIVE mg/dL
Specific Gravity, Urine: 1.002 — ABNORMAL LOW (ref 1.005–1.030)
pH: 7 (ref 5.0–8.0)

## 2023-05-04 LAB — COMPREHENSIVE METABOLIC PANEL
ALT: 25 U/L (ref 0–44)
AST: 33 U/L (ref 15–41)
Albumin: 3.9 g/dL (ref 3.5–5.0)
Alkaline Phosphatase: 62 U/L (ref 38–126)
Anion gap: 12 (ref 5–15)
BUN: 11 mg/dL (ref 6–20)
CO2: 27 mmol/L (ref 22–32)
Calcium: 9.2 mg/dL (ref 8.9–10.3)
Chloride: 99 mmol/L (ref 98–111)
Creatinine, Ser: 0.4 mg/dL — ABNORMAL LOW (ref 0.44–1.00)
GFR, Estimated: >60 mL/min (ref >60)
Glucose, Bld: 91 mg/dL (ref 70–99)
Potassium: 2.9 mmol/L — ABNORMAL LOW (ref 3.5–5.1)
Sodium: 138 mmol/L (ref 135–145)
Total Bilirubin: 0.6 mg/dL (ref 0.3–1.2)
Total Protein: 8.7 g/dL — ABNORMAL HIGH (ref 6.5–8.1)

## 2023-05-04 MED ORDER — FENTANYL CITRATE PF 50 MCG/ML IJ SOSY
50.0000 ug | PREFILLED_SYRINGE | Freq: Once | INTRAMUSCULAR | Status: AC
  Administered 2023-05-04: 50 ug via INTRAVENOUS
  Filled 2023-05-04: qty 1

## 2023-05-04 MED ORDER — LIDOCAINE HCL URETHRAL/MUCOSAL 2 % EX GEL
1.0000 | Freq: Once | CUTANEOUS | Status: AC
  Administered 2023-05-04: 1
  Filled 2023-05-04: qty 11

## 2023-05-04 MED ORDER — POTASSIUM CHLORIDE CRYS ER 20 MEQ PO TBCR
40.0000 meq | EXTENDED_RELEASE_TABLET | Freq: Once | ORAL | Status: AC
  Administered 2023-05-04: 40 meq via ORAL
  Filled 2023-05-04: qty 2

## 2023-05-04 MED ORDER — METOCLOPRAMIDE HCL 5 MG/ML IJ SOLN
5.0000 mg | Freq: Once | INTRAMUSCULAR | Status: AC
  Administered 2023-05-04: 5 mg via INTRAVENOUS
  Filled 2023-05-04: qty 2

## 2023-05-04 MED ORDER — SODIUM CHLORIDE 0.9 % IV BOLUS
500.0000 mL | Freq: Once | INTRAVENOUS | Status: AC
  Administered 2023-05-04: 500 mL via INTRAVENOUS

## 2023-05-04 NOTE — ED Provider Notes (Signed)
[place] EMERGENCY DEPARTMENT AT [facility] Provider Note   CSN: [id number] Arrival date & time: 05/04/23  1933     History {Add pertinent medical, surgical, social history, OB history to HPI:1} Chief Complaint  Patient presents with   Rectal Bleeding    Cynthia Underwood is a 52 y.o. female.  She has a complex medical history including myotonic dystrophy and is recovering after sepsis, V-fib arrest.  She has not had a bowel movement and may be a day or 2.  She gets tube feeds from a PEG tube.  She has been having some rectal pain so her caregiver gave her an enema.  She had worsening abdominal pain and some rectal bleeding and so she was brought here for further evaluation.  Patient is complaining some some burning pain near her anus.  There is not been a fever.  The history is provided by the patient.  Rectal Bleeding Quality:  Bright red Amount:  Scant Chronicity:  New Context: constipation and rectal pain   Pain details:    Quality:  Burning Associated symptoms: abdominal pain   Associated symptoms: no fever and no hematemesis        Home Medications Prior to Admission medications   Medication Sig Start Date End Date Taking? Authorizing Provider  acetaminophen (TYLENOL) 500 MG tablet Take 500 mg by mouth every 6 (six) hours as needed for mild pain.    [provider]  guaifenesin (HUMIBID E) 400 MG TABS tablet Take 400 mg by mouth 3 (three) times daily. 01/19/23   [provider]  ipratropium-albuterol (DUONEB) 0.5-2.5 (3) MG/3ML SOLN SMARTSIG:1 Unspecified Via Inhaler Every 4 Hours PRN 02/20/23   [provider]  levothyroxine (SYNTHROID) 112 MCG tablet Place 1 tablet (112 mcg total) into feeding tube daily before breakfast. 12/07/22   Doran Stabler, DO  loperamide (IMODIUM) 2 MG capsule Take 1 capsule (2 mg total) by mouth every 6 (six) hours as needed for diarrhea or loose stools. 01/30/23   Leroy Sea, MD  metoCLOPramide  (REGLAN) 5 MG tablet Place 5 mg into feeding tube. 01/19/23   [provider]  metoprolol tartrate (LOPRESSOR) 25 MG tablet Place 12.5 mg into feeding tube 2 (two) times daily. 01/19/23   [provider]  Nutritional Supplements (FEEDING SUPPLEMENT, JEVITY 1.5 CAL/FIBER,) LIQD Bolus Feeds: 237cc - Jevity 1.5  - Administer slowly, do not push fast - 30 mL free water flush before and after each bolus - Keep pt upright for 30-60 minutes after each bolus 01/30/23   Leroy Sea, MD  pantoprazole (PROTONIX) 40 MG tablet Take 40 mg by mouth daily.    [provider]  Protein (FEEDING SUPPLEMENT, PROSOURCE TF20,) liquid Place 60 mLs into feeding tube daily. 12/07/22   Doran Stabler, DO  QUEtiapine (SEROQUEL) 25 MG tablet Place 1 tablet (25 mg total) into feeding tube at bedtime. 12/06/22   Doran Stabler, DO  scopolamine (TRANSDERM-SCOP) 1 MG/3DAYS Place 1 patch onto the skin every 3 (three) days. 01/22/23   [provider]  spironolactone (ALDACTONE) 25 MG tablet Place 0.5 tablets (12.5 mg total) into feeding tube daily. 12/07/22   Doran Stabler, DO  traMADol (ULTRAM) 50 MG tablet Take 50 mg by mouth every 8 (eight) hours as needed. 01/20/23   [provider]  Water For Irrigation, Sterile (FREE WATER) SOLN Place 150 mLs into feeding tube 3 (three) times daily. 01/30/23   Leroy Sea, MD      Allergies  Penicillin g and Penicillins    Review of Systems   Review of Systems  Constitutional:  Negative for fever.  Respiratory:  Negative for shortness of breath.   Cardiovascular:  Negative for chest pain.  Gastrointestinal:  Positive for abdominal pain and hematochezia. Negative for hematemesis.    Physical Exam Updated Vital Signs BP 100/68   Pulse 83   Ht 5\' 3"  (1.6 m)   Wt 39.9 kg   LMP 12/03/2016 (Approximate)   SpO2 97%   BMI 15.59 kg/m  Physical Exam Vitals and nursing note reviewed.  Constitutional:      General: She is not in acute  distress.    Appearance: Normal appearance. She is well-developed.  HENT:     Head: Normocephalic and atraumatic.  Eyes:     Conjunctiva/sclera: Conjunctivae normal.  Cardiovascular:     Rate and Rhythm: Normal rate and regular rhythm.     Heart sounds: No murmur heard. Pulmonary:     Effort: Pulmonary effort is normal. No respiratory distress.     Breath sounds: Normal breath sounds.  Abdominal:     Palpations: Abdomen is soft.     Tenderness: There is no abdominal tenderness. There is no guarding or rebound.  Genitourinary:    Comments: Rectal exam done with chaperone.  Patient was diffusely tender.  There was stool at the anal opening and she was manually disimpacted as much as she could tolerate.  Stool was brown.  There was no active bleeding and no masses appreciated. Musculoskeletal:     Cervical back: Neck supple.  Skin:    General: Skin is warm and dry.     Capillary Refill: Capillary refill takes less than 2 seconds.  Neurological:     Mental Status: She is alert.     ED Results / Procedures / Treatments   Labs (all labs ordered are listed, but only abnormal results are displayed) Labs Reviewed - No data to display  EKG None  Radiology No results found.  Procedures Procedures  {Document cardiac monitor, telemetry assessment procedure when appropriate:1}  Medications Ordered in ED Medications  lidocaine (XYLOCAINE) 2 % jelly 1 Application (has no administration in time range)    ED Course/ Medical Decision Making/ A&P   {   Click here for ABCD2, HEART and other calculatorsREFRESH Note before signing :1}                              Medical Decision Making  This patient complains of ***; this involves an extensive number of treatment Options and is a complaint that carries with it a high risk of complications and morbidity. The differential includes ***  I ordered, reviewed and interpreted labs, which included *** I ordered medication *** and  reviewed PMP when indicated. I ordered imaging studies which included *** and I independently    visualized and interpreted imaging which showed *** Additional history obtained from *** Previous records obtained and reviewed *** I consulted *** and discussed lab and imaging findings and discussed disposition.  Cardiac monitoring reviewed, *** Social determinants considered, *** Critical Interventions: ***  After the interventions stated above, I reevaluated the patient and found *** Admission and further testing considered, ***   {Document critical care time when appropriate:1} {Document review of labs and clinical decision tools ie heart score, Chads2Vasc2 etc:1}  {Document your independent review of radiology images, and any outside records:1} {Document your discussion with family members, caretakers,  and with consultants:1} {Document social determinants of health affecting pt's care:1} {Document your decision making why or why not admission, treatments were needed:1} Final Clinical Impression(s) / ED Diagnoses Final diagnoses:  None    Rx / DC Orders ED Discharge Orders     None

## 2023-05-04 NOTE — Discharge Instructions (Signed)
You were seen in the emergency department for rectal bleeding and pain and constipation.  You were given an enema with some improvement in your symptoms.  Please drink plenty of fluids.  Consider increasing your fiber intake.  Follow-up with your regular doctor.  Return to the emergency department if any worsening or concerning symptoms.

## 2023-05-04 NOTE — ED Triage Notes (Signed)
Says she has recently been constipated. Has attempted laxatives, and enema at home with no improvement and then experienced some rectal bleeding.   PEG tube in LUQ abdomen.

## 2023-05-08 DIAGNOSIS — Z950 Presence of cardiac pacemaker: Secondary | ICD-10-CM | POA: Diagnosis not present

## 2023-05-08 DIAGNOSIS — E042 Nontoxic multinodular goiter: Secondary | ICD-10-CM | POA: Diagnosis not present

## 2023-05-08 DIAGNOSIS — I4510 Unspecified right bundle-branch block: Secondary | ICD-10-CM | POA: Diagnosis not present

## 2023-05-08 DIAGNOSIS — I444 Left anterior fascicular block: Secondary | ICD-10-CM | POA: Diagnosis not present

## 2023-05-08 DIAGNOSIS — J9611 Chronic respiratory failure with hypoxia: Secondary | ICD-10-CM | POA: Diagnosis not present

## 2023-05-08 DIAGNOSIS — I5042 Chronic combined systolic (congestive) and diastolic (congestive) heart failure: Secondary | ICD-10-CM | POA: Diagnosis not present

## 2023-05-08 DIAGNOSIS — E039 Hypothyroidism, unspecified: Secondary | ICD-10-CM | POA: Diagnosis not present

## 2023-05-08 DIAGNOSIS — Z9981 Dependence on supplemental oxygen: Secondary | ICD-10-CM | POA: Diagnosis not present

## 2023-05-08 DIAGNOSIS — R1312 Dysphagia, oropharyngeal phase: Secondary | ICD-10-CM | POA: Diagnosis not present

## 2023-05-08 DIAGNOSIS — K219 Gastro-esophageal reflux disease without esophagitis: Secondary | ICD-10-CM | POA: Diagnosis not present

## 2023-05-08 DIAGNOSIS — J690 Pneumonitis due to inhalation of food and vomit: Secondary | ICD-10-CM | POA: Diagnosis not present

## 2023-05-08 DIAGNOSIS — I428 Other cardiomyopathies: Secondary | ICD-10-CM | POA: Diagnosis not present

## 2023-05-09 ENCOUNTER — Telehealth: Payer: Self-pay | Admitting: Pulmonary Disease

## 2023-05-09 MED ORDER — IPRATROPIUM-ALBUTEROL 0.5-2.5 (3) MG/3ML IN SOLN: 3.0000 mL | RESPIRATORY_TRACT | 3 refills | Status: DC | PRN

## 2023-05-09 NOTE — Telephone Encounter (Signed)
Patient is calling wanting to know the status of [facility] [address]. Due Neb  Please call and advise [phone]

## 2023-05-09 NOTE — Telephone Encounter (Signed)
Orders placed.

## 2023-05-09 NOTE — Telephone Encounter (Signed)
Per DME Patient received a Neb on 02/16/2023, he insurance will not cover another for 3-5 years.

## 2023-05-10 NOTE — Telephone Encounter (Signed)
Cynthia Underwood, presents to the front wondering why PT has not had an Oxygen order put in. States they have a Neb already and request POC when they were here with Dr.Dewald at appt.   Also, no referral received yet for the nutritionist.  I see nothing on AVS for last appt.   Please call PT or friend to advise. [phone]  She left the hospital with O2 and friend states Dr. Was going to call in a POC to continue that.   [name] is @ [phone]  (Adding Bonnie's name because she is Dr's nurse tomorrow.)

## 2023-05-10 NOTE — Telephone Encounter (Signed)
Dr. Francine Graven saw the patient on 04/18/23 and I don't see any mention of 02 or POC order

## 2023-05-14 NOTE — Telephone Encounter (Signed)
Duo neb rx sent to pharmacy.

## 2023-05-16 ENCOUNTER — Ambulatory Visit: Payer: 59 | Attending: Family Medicine | Admitting: Family Medicine

## 2023-05-16 ENCOUNTER — Telehealth: Payer: Self-pay | Admitting: Licensed Clinical Social Worker

## 2023-05-16 VITALS — BP 92/61 | HR 85 | Ht 63.0 in | Wt 88.6 lb

## 2023-05-16 DIAGNOSIS — R1312 Dysphagia, oropharyngeal phase: Secondary | ICD-10-CM | POA: Diagnosis not present

## 2023-05-16 DIAGNOSIS — J479 Bronchiectasis, uncomplicated: Secondary | ICD-10-CM | POA: Diagnosis not present

## 2023-05-16 DIAGNOSIS — E042 Nontoxic multinodular goiter: Secondary | ICD-10-CM | POA: Diagnosis not present

## 2023-05-16 DIAGNOSIS — J9611 Chronic respiratory failure with hypoxia: Secondary | ICD-10-CM | POA: Diagnosis not present

## 2023-05-16 DIAGNOSIS — Z1211 Encounter for screening for malignant neoplasm of colon: Secondary | ICD-10-CM | POA: Diagnosis not present

## 2023-05-16 DIAGNOSIS — R5383 Other fatigue: Secondary | ICD-10-CM

## 2023-05-16 DIAGNOSIS — J690 Pneumonitis due to inhalation of food and vomit: Secondary | ICD-10-CM | POA: Diagnosis not present

## 2023-05-16 DIAGNOSIS — K649 Unspecified hemorrhoids: Secondary | ICD-10-CM | POA: Diagnosis not present

## 2023-05-16 DIAGNOSIS — I5042 Chronic combined systolic (congestive) and diastolic (congestive) heart failure: Secondary | ICD-10-CM | POA: Diagnosis not present

## 2023-05-16 DIAGNOSIS — E876 Hypokalemia: Secondary | ICD-10-CM | POA: Diagnosis not present

## 2023-05-16 DIAGNOSIS — E559 Vitamin D deficiency, unspecified: Secondary | ICD-10-CM | POA: Diagnosis not present

## 2023-05-16 DIAGNOSIS — I428 Other cardiomyopathies: Secondary | ICD-10-CM | POA: Diagnosis not present

## 2023-05-16 DIAGNOSIS — Z950 Presence of cardiac pacemaker: Secondary | ICD-10-CM

## 2023-05-16 DIAGNOSIS — E890 Postprocedural hypothyroidism: Secondary | ICD-10-CM

## 2023-05-16 DIAGNOSIS — G7111 Myotonic muscular dystrophy: Secondary | ICD-10-CM | POA: Diagnosis not present

## 2023-05-16 DIAGNOSIS — E43 Unspecified severe protein-calorie malnutrition: Secondary | ICD-10-CM

## 2023-05-16 DIAGNOSIS — K219 Gastro-esophageal reflux disease without esophagitis: Secondary | ICD-10-CM | POA: Diagnosis not present

## 2023-05-16 DIAGNOSIS — Z23 Encounter for immunization: Secondary | ICD-10-CM

## 2023-05-16 DIAGNOSIS — I444 Left anterior fascicular block: Secondary | ICD-10-CM | POA: Diagnosis not present

## 2023-05-16 DIAGNOSIS — E039 Hypothyroidism, unspecified: Secondary | ICD-10-CM | POA: Diagnosis not present

## 2023-05-16 DIAGNOSIS — F320 Major depressive disorder, single episode, mild: Secondary | ICD-10-CM

## 2023-05-16 DIAGNOSIS — Z9981 Dependence on supplemental oxygen: Secondary | ICD-10-CM | POA: Diagnosis not present

## 2023-05-16 DIAGNOSIS — I4510 Unspecified right bundle-branch block: Secondary | ICD-10-CM | POA: Diagnosis not present

## 2023-05-16 MED ORDER — HYDROCORT-PRAMOXINE (PERIANAL) 2.5-1 % EX CREA: 1.0000 | TOPICAL_CREAM | Freq: Three times a day (TID) | CUTANEOUS | 3 refills | Status: DC

## 2023-05-16 NOTE — Progress Notes (Signed)
Subjective:  Patient ID: Cynthia Underwood, female    DOB: 1970-11-28  Age: 52 y.o. MRN: [id number]  CC: Medical Management of Chronic Issues (Pt would like resources about mental therapy, nutritionist and cardiologist.)   HPI Cynthia Underwood is a 52 y.o. 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  PEG tube and trach tube placed during hospitalization, trach site has since closed. Later discharged to a long-term facility after which she was readmitted again in 01/2023 with sepsis secondary to aspiration pneumonia.    Interval History: Discussed the use of AI scribe software for clinical note transcription with the patient, who gave verbal consent to proceed.   The patient's cousin reports that the patient lacks motivation and doesn't initiate activities.  She endorses being depressed but has no suicidal ideations or intent.  She has been on Seroquel for insomnia which was prescribed during her hospitalization.   The patient is currently eating mostly by mouth, but has a PEG tube through which she administers her medications.  She would like to be referred to a nutritionist.  For her hypothyroidism she is on levothyroxine.  She complains of feeling fatigue and is wondering if she can receive a prescription for vitamins.    She was seen 2 months ago for hospital follow-up posthospitalization for pneumonia complicated by sepsis and cardiac arrest.  She is unhappy she has been able to see her cardiologist and had to be placed on a wait list.  Her appointment with her electrophysiologist comes up in 2  months.  Denies presence of chest pain or worsening dyspnea. She did have a visit with pulmonary last month where she was diagnosed with bronchiectasis and has been using nebulizers. She continues to see neurology for follow-up of her myotonic dystrophy.  Past Medical History:  Diagnosis Date   Bifascicular block 10/02/2014   Cataract    Dyspnea    Excess ear wax    Multinodular goiter    Pneumonia    Presence of permanent cardiac pacemaker    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   Sickle cell trait (HCC)    Watery eyes    Left   Weakness of both legs     Past Surgical History:  Procedure Laterality Date   BREAST BIOPSY Right    EYE SURGERY Left 2017   "related to blockage in my nose"   EYE SURGERY Right 05/2019   INSERT / REPLACE / REMOVE PACEMAKER  02/15/2017   IR GASTROSTOMY TUBE MOD SED  01/01/2023   PACEMAKER IMPLANT N/A 02/15/2017   Procedure: Pacemaker Implant;  Surgeon: Duke Salvia, MD;  Location: [facility] INVASIVE CV LAB;  Service: Cardiovascular;  Laterality: N/A;   RIGHT/LEFT HEART CATH AND CORONARY ANGIOGRAPHY N/A 11/16/2022   Procedure: RIGHT/LEFT HEART CATH AND CORONARY ANGIOGRAPHY;  Surgeon: Laurey Morale, MD;  Location: [facility] INVASIVE CV LAB;  Service: Cardiovascular;  Laterality: N/A;   THYROIDECTOMY N/A 04/21/2021   Procedure: TOTAL THYROIDECTOMY;  Surgeon: Axel Filler, MD;  Location: [facility] OR;  Service: General;  Laterality: N/A;    Family History  Problem Relation Age of Onset   Cancer Father 63  Deceased   Heart disease Father    COPD Mother    Diabetes Maternal Uncle    Heart disease Maternal Uncle    Heart disease Paternal Grandmother    Diabetes Paternal Grandmother    Hypertension Paternal Grandmother    Neuromuscular disorder Maternal Aunt        Myotonic dystrophy   Neuromuscular disorder Cousin        Myotonic dystrophy   Neuromuscular disorder Sister        Myotonic dystrophy   Colon cancer  Neg Hx    Colon polyps Neg Hx    Esophageal cancer Neg Hx    Rectal cancer Neg Hx    Stomach cancer Neg Hx     Social History   Socioeconomic History   Marital status: Single    Spouse name: Not on file   Number of children: 0   Years of education: 1.5 Collge   Highest education level: Not on file  Occupational History   Occupation: Unemployed    Employer: OTHER  Tobacco Use   Smoking status: Former    Current packs/day: 0.00    Types: Cigarettes    Start date: 10/04/1988    Quit date: 10/04/2016    Years since quitting: 6.6   Smokeless tobacco: Never  Vaping Use   Vaping status: Never Used  Substance and Sexual Activity   Alcohol use: Not Currently    Alcohol/week: 0.0 standard drinks of alcohol    Comment:  few times a year   Drug use: Not Currently    Types: Marijuana    Comment: occ    Sexual activity: Not Currently    Birth control/protection: None, Condom  Other Topics Concern   Not on file  Social History Narrative   Patient lives at home with her aunt.   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  She moved to [facility] in August 2015.   She has been on disability since 2010.   Education: 1 1/2 years of college.   Caffeine - coke 2 cans/day   Exercise - no   Social Determinants of Health   Financial Resource Strain: Low Risk  (10/16/2022)   Overall Financial Resource Strain (CARDIA)    Difficulty of Paying Living Expenses: Not hard at all  Food Insecurity: No Food Insecurity (03/02/2023)   Hunger Vital Sign    Worried About Running Out of Food in the Last Year: Never true    Ran Out of Food in the Last Year: Never true  Transportation Needs: No Transportation Needs (03/02/2023)   PRAPARE - Administrator, Civil Service (Medical): No    Lack of Transportation (Non-Medical): No  Physical Activity: Inactive (10/16/2022)   Exercise Vital Sign    Days of Exercise per Week: 0 days    Minutes of Exercise per Session: 0 min  Stress:  Stress Concern Present (10/16/2022)   [employer] of Occupational Health - Occupational Stress Questionnaire    Feeling of Stress : To some extent  Social Connections: Socially Isolated (10/16/2022)   Social Connection and Isolation Panel [NHANES]    Frequency of Communication with Friends and Family: Three times a week    Frequency of Social Gatherings with Friends and Family: Three times a week    Attends Religious Services: Never    Active Member of Clubs or Organizations: No    Attends Banker Meetings: Never    Marital Status: Never married    Allergies  Allergen Reactions   Penicillin G Other (See Comments), Rash and Nausea And Vomiting    Sore throat   Penicillins Nausea And Vomiting, Rash and Other (See Comments)    Fever Has patient had a PCN reaction causing immediate rash, facial/tongue/throat swelling, SOB or lightheadedness with hypotension: Yes Has patient had a PCN reaction causing severe rash involving mucus membranes or skin necrosis: Unknown Has patient had a PCN reaction that required hospitalization: No Has patient had a PCN reaction occurring within the last 10 years: No If all of the above answers are "NO", then may proceed with Cephalosporin use.     Outpatient Medications Prior to Visit  Medication Sig Dispense Refill   acetaminophen (TYLENOL) 500 MG tablet Take 500 mg by mouth every 6 (six) hours as needed for mild pain.     guaifenesin (HUMIBID E) 400 MG TABS tablet Take 400 mg by mouth 3 (three) times daily.     ipratropium-albuterol (DUONEB) 0.5-2.5 (3) MG/3ML SOLN Inhale 3 mLs into the lungs every 4 (four) hours as needed. SMARTSIG:1 Unspecified Via Inhaler Every 4 Hours PRN 360 mL 3   levothyroxine (SYNTHROID) 112 MCG tablet Place 1 tablet (112 mcg total) into feeding tube daily before breakfast.     loperamide (IMODIUM) 2 MG capsule Take 1 capsule (2 mg total) by mouth every 6 (six) hours as needed for diarrhea or loose stools. 30  capsule 0   metoCLOPramide (REGLAN) 5 MG tablet Place 5 mg into feeding tube.     metoprolol tartrate (LOPRESSOR) 25 MG tablet Place 12.5 mg into feeding tube 2 (two) times daily.     Nutritional Supplements (FEEDING SUPPLEMENT, JEVITY 1.5 CAL/FIBER,) LIQD Bolus Feeds: 237cc - Jevity 1.5  - Administer slowly, do not push fast - 30 mL free water flush before and after each bolus - Keep pt upright for 30-60 minutes after each bolus     pantoprazole (PROTONIX) 40 MG tablet Take 40 mg by mouth daily.     Protein (FEEDING SUPPLEMENT, PROSOURCE TF20,) liquid Place 60 mLs into feeding tube daily.     QUEtiapine (SEROQUEL) 25 MG tablet Place 1 tablet (25 mg total) into feeding tube at bedtime.     scopolamine (TRANSDERM-SCOP) 1 MG/3DAYS Place 1 patch onto the skin every 3 (three) days.     spironolactone (ALDACTONE) 25 MG tablet Place 0.5 tablets (12.5 mg total) into feeding tube daily.     traMADol (ULTRAM) 50 MG tablet Take 50 mg by mouth every 8 (eight) hours as needed.     Water For Irrigation, Sterile (FREE WATER) SOLN Place 150 mLs into feeding tube 3 (three) times daily.     No facility-administered medications prior to visit.     ROS Review of Systems  Constitutional:  Positive for fatigue. Negative for activity change and appetite change.  HENT:  Negative for sinus pressure and sore throat.   Respiratory:  Negative for chest tightness, shortness of breath and wheezing.   Cardiovascular:  Negative for chest pain and palpitations.  Gastrointestinal:  Negative for abdominal distention, abdominal pain and constipation.  Genitourinary: Negative.   Musculoskeletal: Negative.   Psychiatric/Behavioral:  Positive for dysphoric mood. Negative for behavioral problems.     Objective:  BP 92/61 (BP Location: Right Arm, Patient Position: Sitting, Cuff Size: Small)   Pulse 85   Ht 5\' 3"  (1.6 m)   Wt 88 lb 9.6 oz (40.2 kg)   LMP 12/03/2016 (Approximate)   SpO2 100%   BMI 15.69 kg/m  05/16/2023    2:07 PM 05/04/2023   11:00 PM 05/04/2023    9:15 PM  BP/Weight  Systolic BP 92 108 109  Diastolic BP 61 61 68  Wt. (Lbs) 88.6    BMI 15.69 kg/m2        Physical Exam Constitutional:      Appearance: She is well-developed.     Comments: Underweight  Cardiovascular:     Rate and Rhythm: Normal rate.     Heart sounds: Normal heart sounds. No murmur heard. Pulmonary:     Effort: Pulmonary effort is normal.     Breath sounds: Normal breath sounds. No wheezing or rales.  Chest:     Chest wall: No tenderness.  Abdominal:     General: Bowel sounds are normal. There is no distension.     Palpations: Abdomen is soft. There is no mass.     Tenderness: There is no abdominal tenderness.     Comments: PEG tube in place  Musculoskeletal:        General: Normal range of motion.     Right lower leg: No edema.     Left lower leg: No edema.  Neurological:     Mental Status: She is alert and oriented to person, place, and time.  Psychiatric:     Comments: Dysphoric mood        Latest Ref Rng & Units 05/04/2023   10:27 PM 01/30/2023    2:14 AM 01/27/2023    2:37 AM  CMP  Glucose 70 - 99 mg/dL 91  756  433   BUN 6 - 20 mg/dL 11  15  11    Creatinine 0.44 - 1.00 mg/dL 2.95  1.88  4.16   Sodium 135 - 145 mmol/L 138  137  142   Potassium 3.5 - 5.1 mmol/L 2.9  3.8  3.8   Chloride 98 - 111 mmol/L 99  99  103   CO2 22 - 32 mmol/L 27  28  32   Calcium 8.9 - 10.3 mg/dL 9.2  8.6  9.1   Total Protein 6.5 - 8.1 g/dL 8.7     Total Bilirubin 0.3 - 1.2 mg/dL 0.6     Alkaline Phos 38 - 126 U/L 62     AST 15 - 41 U/L 33     ALT 0 - 44 U/L 25       Lipid Panel     Component Value Date/Time   CHOL 214 (H) 01/10/2018 0847   TRIG 113 01/10/2018 0847   HDL 57 01/10/2018 0847   CHOLHDL 3.8 01/10/2018 0847   CHOLHDL 3.5 08/26/2014 1516   VLDL 20 08/26/2014 1516   LDLCALC 134 (H) 01/10/2018 0847    CBC    Component Value Date/Time   WBC 10.7 (H) 05/04/2023 2227   RBC 4.70  05/04/2023 2227   HGB 14.0 05/04/2023 2227   HGB 12.6 10/04/2022 1638   HCT 45.9 05/04/2023 2227   HCT 37.6 10/04/2022 1638   PLT 198 05/04/2023 2227   PLT 192 10/04/2022 1638   MCV 97.7 05/04/2023 2227   MCV 94 10/04/2022 1638   MCH 29.8 05/04/2023 2227   MCHC 30.5 05/04/2023 2227   RDW 14.1 05/04/2023 2227   RDW 15.1 10/04/2022 1638   LYMPHSABS 1.3 05/04/2023 2227   LYMPHSABS 2.1 10/04/2022 1638   MONOABS 0.4 05/04/2023 2227   EOSABS 0.0 05/04/2023 2227   EOSABS 0.1 10/04/2022 1638   BASOSABS 0.0 05/04/2023 2227   BASOSABS 0.0 10/04/2022  1610    Lab Results  Component Value Date   HGBA1C 5.6 11/15/2022    Lab Results  Component Value Date   TSH 1.978 11/11/2022    Assessment & Plan:      Pacemaker in situ Difficulty scheduling appointment with cardiologist. Pacemaker check scheduled for November 5th with Electrophysiologist -Continue attempts to schedule cardiology appointment. -Keep pacemaker check appointment.  Bronchiectasis Stable Continue nebulizers Managed by pulmonologist. -Continue follow-up with pulmonologist as scheduled.  Depression Reports of sadness, lack of motivation, and confusion. -Initiate psychotherapy with in-clinic counselor, Toni Amend. -Consider medication management pending therapy outcomes.  Protein calorie malnutrition Predominantly oral intake with PEG tube for medication administration. -Refer to nutritionist for further assessment and management.  Hemorrhoids Recent rectal bleeding attributed to hemorrhoids. -Prescribe hemorrhoid cream for symptom management.  Hypothyroidism On levothyroxine. Last thyroid panel in March. -Order thyroid panel to assess current thyroid function and adjust levothyroxine dose as necessary.  Hypokalemia Hypokalemia of 2.9 Noted during recent ER visit. -Check potassium levels.  Vitamin D Deficiency Reports of low energy. Previous significantly low Vitamin D levels. -Check Vitamin D levels.  If low, prescribe Vitamin D supplementation.  Immunizations -Administer flu shot today. -Up-to-date on PCV 20  Follow-up -Return for review of lab results and further management.          Meds ordered this encounter  Medications   hydrocortisone-pramoxine (ANALPRAM HC) 2.5-1 % rectal cream    Sig: Place 1 Application rectally 3 (three) times daily.    Dispense:  30 g    Refill:  3    Follow-up: Return in about 3 months (around 08/15/2023) for Chronic medical conditions.       Hoy Register, MD, FAAFP. [facility] [place], [place] [phone]   05/16/2023, 5:09 PM

## 2023-05-16 NOTE — Telephone Encounter (Signed)
Met with pt during a warm hand off. Pt was referred by PCP depression, lost of memory after an incident in March. Pt would like to learn coping skills to reduce her derepression.

## 2023-05-18 ENCOUNTER — Telehealth (INDEPENDENT_AMBULATORY_CARE_PROVIDER_SITE_OTHER): Payer: Self-pay | Admitting: Family Medicine

## 2023-05-18 ENCOUNTER — Ambulatory Visit: Payer: Self-pay

## 2023-05-18 NOTE — Telephone Encounter (Signed)
Copied from CRM [id number]. Topic: General - Inquiry >> May 17, 2023  2:35 PM De Blanch wrote: Reason for CRM: Pt called in requesting to speak with a nurse regarding her referral for nutrition. It needs to be sent to a different location as [facility] does not accept her insurance as well as for a dietician as she would have to pay out of pocket.   Pt was advised dietician referral was not sent by Dr.Newlin    Please advise.

## 2023-05-18 NOTE — Telephone Encounter (Signed)
FYI

## 2023-05-18 NOTE — Patient Instructions (Signed)
Visit Information  Thank you for taking time to visit with me today. Please don't hesitate to contact me if I can be of assistance to you.   Following are the goals we discussed today:   Goals Addressed             This Visit's Progress    To be able to eat and drink normally and regain my speech   On track    Care Coordination Interventions: Completed outbound call with patient and sister Marty Picket  Evaluation of current treatment plan related to Impaired Speech and Swallowing and patient's adherence to plan as established by provider Review of patient status, including review of consultant's reports, relevant laboratory and other test results, and medications completed Reviewed PCP recommendation for a Nutritionist, educated sister [name] regarding contacting patient's health plan to inquire about in network providers for a nutritionist Reviewed next follow up with Neurologist is scheduled for 09/04/23 @8 :30 AM      To establish with a Pulmonologist for monitoring of recent pneumonia   On track    Care Coordination Interventions: Completed successful outbound call with patient and sister [name]  Evaluation of current treatment plan related to aspirational pneumonia  and patient's adherence to plan as established by provider Review of patient status, including review of consultant's reports, relevant laboratory and other test results, and medications completed Reviewed patient's upcoming scheduled follow with [facility] Pulmonology, Dr. Francine Graven on 07/23/23 @3 :45 PM         Our next appointment is by telephone on 06/01/23 at 2:30 PM   Please call the care guide team at [phone] if you need to cancel or reschedule your appointment.   If you are experiencing a Mental Health or Behavioral Health Crisis or need someone to talk to, please call 1-800-273-TALK (toll free, 24 hour hotline)  Patient verbalizes understanding of instructions and care plan provided today and agrees to view  in MyChart. Active MyChart status and patient understanding of how to access instructions and care plan via MyChart confirmed with patient.     [name] RN BSN CCM [place]  [facility], [facility] Nurse Care Coordinator  Direct Dial: [phone] Website: [email or web address]

## 2023-05-18 NOTE — Patient Outreach (Addendum)
Care Coordination   Follow Up Visit Note   05/18/2023 Name: Cynthia Underwood MRN: [id number] DOB: 02-15-1971  Cynthia Underwood is a 52 y.o. year old female who sees Hoy Register, MD for primary care. I spoke with sister Cynthia Underwood by phone today.  What matters to the patients health and wellness today?  Patient would like to follow up with a Nutritionist. She would like an earlier appointment with her Neurologist.     Goals Addressed             This Visit's Progress    To be able to eat and drink normally and regain my speech   On track    Care Coordination Interventions: Completed outbound call with patient and sister Cynthia Underwood  Evaluation of current treatment plan related to Impaired Speech and Swallowing and patient's adherence to plan as established by provider Review of patient status, including review of consultant's reports, relevant laboratory and other test results, and medications completed Reviewed PCP recommendation for a Nutritionist, educated sister Cynthia Underwood regarding contacting patient's health plan to inquire about in network providers for a nutritionist Reviewed next follow up with Neurologist is scheduled for 09/04/23 @8 :30 AM      To establish with a Pulmonologist for monitoring of recent pneumonia   On track    Care Coordination Interventions: Completed successful outbound call with patient and sister Cynthia Underwood  Evaluation of current treatment plan related to aspirational pneumonia  and patient's adherence to plan as established by provider Review of patient status, including review of consultant's reports, relevant laboratory and other test results, and medications completed Reviewed patient's upcoming scheduled follow with [facility] Pulmonology, Dr. Francine Graven on 07/23/23 @3 :45 PM     Interventions Today    Flowsheet Row Most Recent Value  Chronic Disease   Chronic disease during today's visit Other  [failure to thrive,  Bronchiectasis,  myotonic dystrophy]   General Interventions   General Interventions Discussed/Reviewed General Interventions Discussed, General Interventions Reviewed, Labs, Doctor Visits  Doctor Visits Discussed/Reviewed Doctor Visits Discussed, Doctor Visits Reviewed, Specialist, PCP  Education Interventions   Education Provided Provided Education  Provided Verbal Education On Nutrition, When to see the doctor, Labs  Nutrition Interventions   Nutrition Discussed/Reviewed Nutrition Discussed, Nutrition Reviewed  Pharmacy Interventions   Pharmacy Dicussed/Reviewed Pharmacy Topics Reviewed          SDOH assessments and interventions completed:  No     Care Coordination Interventions:  Yes, provided   Follow up plan: Follow up call scheduled for 06/01/23 @2 :30 PM    Encounter Outcome:  Patient Visit Completed

## 2023-05-20 ENCOUNTER — Inpatient Hospital Stay (HOSPITAL_COMMUNITY)
Admission: EM | Admit: 2023-05-20 | Discharge: 2023-05-25 | DRG: 871 | Disposition: A | Discharge status: Home / Self Care | Payer: 59 | Attending: Internal Medicine | Admitting: Internal Medicine

## 2023-05-20 ENCOUNTER — Emergency Department (HOSPITAL_COMMUNITY): Payer: 59

## 2023-05-20 ENCOUNTER — Encounter (HOSPITAL_COMMUNITY): Payer: Self-pay

## 2023-05-20 ENCOUNTER — Other Ambulatory Visit: Payer: Self-pay

## 2023-05-20 DIAGNOSIS — I5022 Chronic systolic (congestive) heart failure: Secondary | ICD-10-CM | POA: Diagnosis not present

## 2023-05-20 DIAGNOSIS — E43 Unspecified severe protein-calorie malnutrition: Secondary | ICD-10-CM | POA: Diagnosis not present

## 2023-05-20 DIAGNOSIS — A419 Sepsis, unspecified organism: Secondary | ICD-10-CM | POA: Diagnosis not present

## 2023-05-20 DIAGNOSIS — Z79899 Other long term (current) drug therapy: Secondary | ICD-10-CM

## 2023-05-20 DIAGNOSIS — G7111 Myotonic muscular dystrophy: Secondary | ICD-10-CM | POA: Diagnosis present

## 2023-05-20 DIAGNOSIS — F39 Unspecified mood [affective] disorder: Secondary | ICD-10-CM | POA: Diagnosis present

## 2023-05-20 DIAGNOSIS — G709 Myoneural disorder, unspecified: Secondary | ICD-10-CM | POA: Diagnosis not present

## 2023-05-20 DIAGNOSIS — Z743 Need for continuous supervision: Secondary | ICD-10-CM | POA: Diagnosis not present

## 2023-05-20 DIAGNOSIS — J690 Pneumonitis due to inhalation of food and vomit: Secondary | ICD-10-CM | POA: Diagnosis not present

## 2023-05-20 DIAGNOSIS — R64 Cachexia: Secondary | ICD-10-CM | POA: Diagnosis not present

## 2023-05-20 DIAGNOSIS — K219 Gastro-esophageal reflux disease without esophagitis: Secondary | ICD-10-CM | POA: Diagnosis not present

## 2023-05-20 DIAGNOSIS — R1310 Dysphagia, unspecified: Secondary | ICD-10-CM | POA: Diagnosis not present

## 2023-05-20 DIAGNOSIS — I509 Heart failure, unspecified: Secondary | ICD-10-CM | POA: Diagnosis not present

## 2023-05-20 DIAGNOSIS — R0902 Hypoxemia: Secondary | ICD-10-CM | POA: Diagnosis not present

## 2023-05-20 DIAGNOSIS — I459 Conduction disorder, unspecified: Secondary | ICD-10-CM | POA: Diagnosis present

## 2023-05-20 DIAGNOSIS — Z79890 Hormone replacement therapy (postmenopausal): Secondary | ICD-10-CM

## 2023-05-20 DIAGNOSIS — E861 Hypovolemia: Secondary | ICD-10-CM | POA: Diagnosis not present

## 2023-05-20 DIAGNOSIS — Z931 Gastrostomy status: Secondary | ICD-10-CM

## 2023-05-20 DIAGNOSIS — J9621 Acute and chronic respiratory failure with hypoxia: Secondary | ICD-10-CM | POA: Diagnosis not present

## 2023-05-20 DIAGNOSIS — R6520 Severe sepsis without septic shock: Secondary | ICD-10-CM | POA: Diagnosis not present

## 2023-05-20 DIAGNOSIS — R6889 Other general symptoms and signs: Secondary | ICD-10-CM | POA: Diagnosis not present

## 2023-05-20 DIAGNOSIS — I517 Cardiomegaly: Secondary | ICD-10-CM | POA: Diagnosis not present

## 2023-05-20 DIAGNOSIS — G8929 Other chronic pain: Secondary | ICD-10-CM | POA: Diagnosis not present

## 2023-05-20 DIAGNOSIS — E890 Postprocedural hypothyroidism: Secondary | ICD-10-CM | POA: Diagnosis not present

## 2023-05-20 DIAGNOSIS — Z1152 Encounter for screening for COVID-19: Secondary | ICD-10-CM

## 2023-05-20 DIAGNOSIS — G4489 Other headache syndrome: Secondary | ICD-10-CM | POA: Diagnosis not present

## 2023-05-20 DIAGNOSIS — Z681 Body mass index (BMI) 19 or less, adult: Secondary | ICD-10-CM | POA: Diagnosis not present

## 2023-05-20 DIAGNOSIS — Z950 Presence of cardiac pacemaker: Secondary | ICD-10-CM

## 2023-05-20 DIAGNOSIS — R918 Other nonspecific abnormal finding of lung field: Secondary | ICD-10-CM | POA: Diagnosis not present

## 2023-05-20 DIAGNOSIS — J440 Chronic obstructive pulmonary disease with acute lower respiratory infection: Secondary | ICD-10-CM | POA: Diagnosis present

## 2023-05-20 DIAGNOSIS — R0602 Shortness of breath: Secondary | ICD-10-CM | POA: Diagnosis not present

## 2023-05-20 DIAGNOSIS — J189 Pneumonia, unspecified organism: Secondary | ICD-10-CM | POA: Diagnosis present

## 2023-05-20 DIAGNOSIS — Z8249 Family history of ischemic heart disease and other diseases of the circulatory system: Secondary | ICD-10-CM

## 2023-05-20 DIAGNOSIS — R0689 Other abnormalities of breathing: Secondary | ICD-10-CM | POA: Diagnosis not present

## 2023-05-20 DIAGNOSIS — D573 Sickle-cell trait: Secondary | ICD-10-CM | POA: Diagnosis present

## 2023-05-20 DIAGNOSIS — Z87891 Personal history of nicotine dependence: Secondary | ICD-10-CM

## 2023-05-20 DIAGNOSIS — Z825 Family history of asthma and other chronic lower respiratory diseases: Secondary | ICD-10-CM

## 2023-05-20 LAB — COMPREHENSIVE METABOLIC PANEL
ALT: 13 U/L (ref 0–44)
AST: 23 U/L (ref 15–41)
Albumin: 3 g/dL — ABNORMAL LOW (ref 3.5–5.0)
Alkaline Phosphatase: 73 U/L (ref 38–126)
Anion gap: 13 (ref 5–15)
BUN: 7 mg/dL (ref 6–20)
CO2: 28 mmol/L (ref 22–32)
Calcium: 9.4 mg/dL (ref 8.9–10.3)
Chloride: 97 mmol/L — ABNORMAL LOW (ref 98–111)
Creatinine, Ser: 0.51 mg/dL (ref 0.44–1.00)
GFR, Estimated: >60 mL/min (ref >60)
Glucose, Bld: 105 mg/dL — ABNORMAL HIGH (ref 70–99)
Potassium: 3.6 mmol/L (ref 3.5–5.1)
Sodium: 138 mmol/L (ref 135–145)
Total Bilirubin: 0.7 mg/dL (ref 0.3–1.2)
Total Protein: 8.7 g/dL — ABNORMAL HIGH (ref 6.5–8.1)

## 2023-05-20 LAB — PROTIME-INR
INR: 1.3 — ABNORMAL HIGH (ref 0.8–1.2)
Prothrombin Time: 15.9 s — ABNORMAL HIGH (ref 11.4–15.2)

## 2023-05-20 LAB — CBC WITH DIFFERENTIAL/PLATELET
Abs Immature Granulocytes: 0.06 10*3/uL (ref 0.00–0.07)
Basophils Absolute: 0 10*3/uL (ref 0.0–0.1)
Basophils Relative: 0 %
Eosinophils Absolute: 0.1 10*3/uL (ref 0.0–0.5)
Eosinophils Relative: 0 %
HCT: 43.9 % (ref 36.0–46.0)
Hemoglobin: 13.5 g/dL (ref 12.0–15.0)
Immature Granulocytes: 0 %
Lymphocytes Relative: 12 %
Lymphs Abs: 1.8 10*3/uL (ref 0.7–4.0)
MCH: 29.5 pg (ref 26.0–34.0)
MCHC: 30.8 g/dL (ref 30.0–36.0)
MCV: 96.1 fL (ref 80.0–100.0)
Monocytes Absolute: 0.6 10*3/uL (ref 0.1–1.0)
Monocytes Relative: 4 %
Neutro Abs: 12.9 10*3/uL — ABNORMAL HIGH (ref 1.7–7.7)
Neutrophils Relative %: 84 %
Platelets: 309 10*3/uL (ref 150–400)
RBC: 4.57 MIL/uL (ref 3.87–5.11)
RDW: 14.3 % (ref 11.5–15.5)
WBC: 15.5 10*3/uL — ABNORMAL HIGH (ref 4.0–10.5)
nRBC: 0 % (ref 0.0–0.2)

## 2023-05-20 LAB — URINALYSIS, ROUTINE W REFLEX MICROSCOPIC
Bilirubin Urine: NEGATIVE
Glucose, UA: NEGATIVE mg/dL
Hgb urine dipstick: NEGATIVE
Ketones, ur: NEGATIVE mg/dL
Leukocytes,Ua: NEGATIVE
Nitrite: NEGATIVE
Protein, ur: NEGATIVE mg/dL
Specific Gravity, Urine: 1.015 (ref 1.005–1.030)
pH: 7 (ref 5.0–8.0)

## 2023-05-20 LAB — BRAIN NATRIURETIC PEPTIDE: B Natriuretic Peptide: 59.8 pg/mL (ref 0.0–100.0)

## 2023-05-20 LAB — LIPASE, BLOOD: Lipase: 21 U/L (ref 11–51)

## 2023-05-20 LAB — LACTIC ACID, PLASMA: Lactic Acid, Venous: 3.1 mmol/L (ref 0.5–1.9)

## 2023-05-20 LAB — TROPONIN I (HIGH SENSITIVITY): Troponin I (High Sensitivity): 14 ng/L (ref <18)

## 2023-05-20 MED ORDER — LACTATED RINGERS IV BOLUS
500.0000 mL | Freq: Once | INTRAVENOUS | Status: AC
  Administered 2023-05-20: 500 mL via INTRAVENOUS

## 2023-05-20 MED ORDER — SODIUM CHLORIDE 0.9 % IV SOLN
2.0000 g | Freq: Once | INTRAVENOUS | Status: AC
  Administered 2023-05-20: 2 g via INTRAVENOUS
  Filled 2023-05-20: qty 12.5

## 2023-05-20 MED ORDER — VANCOMYCIN HCL IN DEXTROSE 1-5 GM/200ML-% IV SOLN
1000.0000 mg | Freq: Once | INTRAVENOUS | Status: AC
  Administered 2023-05-20: 1000 mg via INTRAVENOUS
  Filled 2023-05-20: qty 200

## 2023-05-20 NOTE — Progress Notes (Addendum)
ED Pharmacy Antibiotic Sign Off An antibiotic consult was received from an ED provider for cefepime per pharmacy dosing for hospital acquired pneumonia. A chart review was completed to assess appropriateness.   The following one time order(s) were placed:   -Cefepime 2g IV x1 (PCN allergy, but has tolerated cephalosporins in the past) -Vancomycin 1000 mg IV x1  Further antibiotic and/or antibiotic pharmacy consults should be ordered by the admitting provider if indicated.   Thank you for allowing pharmacy to be a part of this patient's care.   Arabella Merles, [facility]  Clinical Pharmacist 05/20/23 11:18 PM

## 2023-05-20 NOTE — ED Notes (Signed)
Patient transported to X-ray 

## 2023-05-20 NOTE — ED Triage Notes (Signed)
Pt from home with ems, pt c.o weakness, malaise, body aches and productive cough. Pt not eating much lately. Has a feeding tube. Hx of pneumonia earlier this year, required an ICU stay and trach.  89% on room air 94% 2L   Rales noted all over with some wheezing in the upper lungs.  118/70

## 2023-05-20 NOTE — ED Provider Notes (Signed)
[place] EMERGENCY DEPARTMENT AT [facility] Provider Note   CSN: [id number] Arrival date & time: 05/20/23  2225     History {Add pertinent medical, surgical, social history, OB history to HPI:1} Chief Complaint  Patient presents with   Weakness   Cough    Cynthia Underwood is a 52 y.o. female.   Cynthia Underwood is a 52 y.o. 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  She is a poor historian.  States has been coughing for the past 2 to 3 days productive of green mucus associated body aches and chills.  Believes she has had a fever but has not checked her temperature.  Has a history of COPD and wears oxygen at night only but not during the day.  Does not know how much oxygen she wears.  Poor intake at home.  Does have a feeding tube.  She feels generally weak and has some chest pain with coughing.  No documented fever.  No leg pain or leg swelling.  Hypoxic for EMS on room air.  No recent travel or sick contacts.   The history is provided by the patient.  Weakness Associated symptoms: arthralgias, cough and myalgias   Associated symptoms: no abdominal pain, no chest pain, no dysuria, no fever, no headaches, no nausea and no vomiting   Cough Associated symptoms: myalgias and rhinorrhea   Associated symptoms: no chest pain, no fever, no headaches and no rash        Home Medications Prior to Admission medications   Medication Sig Start Date End Date Taking? Authorizing Provider  acetaminophen (TYLENOL) 500 MG tablet Take 500 mg by mouth every 6 (six) hours as needed for mild pain.    [provider]  guaifenesin (HUMIBID E) 400 MG TABS tablet Take 400 mg by mouth 3 (three) times daily. 01/19/23   [provider]  hydrocortisone-pramoxine (ANALPRAM HC) 2.5-1 % rectal cream Place 1 Application rectally 3 (three) times daily. 05/16/23   Hoy Register, MD  ipratropium-albuterol (DUONEB) 0.5-2.5 (3) MG/3ML SOLN Inhale 3 mLs into the lungs every 4 (four) hours as needed. SMARTSIG:1 Unspecified Via Inhaler Every 4 Hours PRN 05/09/23   Martina Sinner, MD  levothyroxine (SYNTHROID) 112 MCG tablet Place 1 tablet (112 mcg total) into feeding tube daily before breakfast. 12/07/22   Doran Stabler, DO  loperamide (IMODIUM) 2 MG capsule Take 1 capsule (2 mg total) by mouth every 6 (six) hours as needed for diarrhea or loose stools. 01/30/23   Leroy Sea, MD  metoCLOPramide (REGLAN) 5 MG tablet Place 5 mg into feeding tube. 01/19/23   [provider]  metoprolol tartrate (LOPRESSOR) 25 MG tablet Place 12.5 mg into feeding tube 2 (two) times daily. 01/19/23   [provider]  Nutritional Supplements (FEEDING SUPPLEMENT, JEVITY 1.5 CAL/FIBER,) LIQD Bolus Feeds: 237cc - Jevity 1.5  - Administer slowly, do not push fast - 30 mL free water flush before and after each bolus - Keep pt upright for 30-60 minutes after each bolus 01/30/23   Leroy Sea, MD  pantoprazole (PROTONIX) 40 MG tablet Take 40 mg by mouth daily.    [provider]  Protein (FEEDING SUPPLEMENT,  PROSOURCE TF20,) liquid Place 60 mLs into feeding tube daily. 12/07/22   Doran Stabler, DO  QUEtiapine (SEROQUEL) 25 MG tablet Place 1 tablet (25 mg total) into feeding tube at bedtime. 12/06/22   Doran Stabler, DO  scopolamine (TRANSDERM-SCOP) 1 MG/3DAYS Place 1 patch onto the skin every 3 (three) days. 01/22/23   [provider]  spironolactone (ALDACTONE) 25 MG tablet Place 0.5 tablets (12.5 mg total) into feeding tube daily. 12/07/22   Doran Stabler, DO  traMADol (ULTRAM) 50 MG tablet Take 50 mg  by mouth every 8 (eight) hours as needed. 01/20/23   [provider]  Water For Irrigation, Sterile (FREE WATER) SOLN Place 150 mLs into feeding tube 3 (three) times daily. 01/30/23   Leroy Sea, MD      Allergies    Penicillin g and Penicillins    Review of Systems   Review of Systems  Constitutional:  Positive for activity change and appetite change. Negative for fever.  HENT:  Positive for congestion and rhinorrhea.   Respiratory:  Positive for cough. Negative for chest tightness.   Cardiovascular:  Negative for chest pain.  Gastrointestinal:  Negative for abdominal pain, nausea and vomiting.  Genitourinary:  Negative for dysuria and hematuria.  Musculoskeletal:  Positive for arthralgias and myalgias.  Skin:  Negative for rash.  Neurological:  Positive for weakness. Negative for headaches.   all other systems are negative except as noted in the HPI and PMH.    Physical Exam Updated Vital Signs BP (!) 140/88   Pulse (!) 108   Temp 98.6 F (37 C) (Oral)   Resp 18   LMP 12/03/2016 (Approximate)   SpO2 100%  Physical Exam Vitals and nursing note reviewed.  Constitutional:      General: She is not in acute distress.    Appearance: She is well-developed. She is ill-appearing.     Comments: No respiratory distress at rest  HENT:     Head: Normocephalic and atraumatic.     Mouth/Throat:     Pharynx: No oropharyngeal exudate.  Eyes:     Conjunctiva/sclera: Conjunctivae normal.     Pupils: Pupils are equal, round, and reactive to light.  Neck:     Comments: No meningismus. Cardiovascular:     Rate and Rhythm: Normal rate and regular rhythm.     Heart sounds: Normal heart sounds. No murmur heard. Pulmonary:     Effort: Pulmonary effort is normal. No respiratory distress.     Breath sounds: Wheezing and rhonchi present.     Comments: Scattered rhonchi bilateral Abdominal:     Palpations: Abdomen is soft.     Tenderness: There is no abdominal tenderness.  There is no guarding or rebound.  Musculoskeletal:        General: No tenderness. Normal range of motion.     Cervical back: Normal range of motion and neck supple.  Skin:    General: Skin is warm.  Neurological:     Mental Status: She is alert and oriented to person, place, and time.     Cranial Nerves: No cranial nerve deficit.     Motor: No abnormal muscle tone.     Coordination: Coordination normal.     Comments:  5/5 strength throughout. CN 2-12 intact.Equal grip strength.   Psychiatric:        Behavior: Behavior normal.     ED Results / Procedures / Treatments   Labs (all labs ordered are listed, but only abnormal results are displayed) Labs  Reviewed  CULTURE, BLOOD (ROUTINE X 2)  CULTURE, BLOOD (ROUTINE X 2)  CBC WITH DIFFERENTIAL/PLATELET  COMPREHENSIVE METABOLIC PANEL  PROTIME-INR  LACTIC ACID, PLASMA  LIPASE, BLOOD  URINALYSIS, ROUTINE W REFLEX MICROSCOPIC  BRAIN NATRIURETIC PEPTIDE  TROPONIN I (HIGH SENSITIVITY)    EKG EKG Interpretation Date/Time:  Sunday May 20 2023 22:35:11 EDT Ventricular Rate:  106 PR Interval:    QRS Duration:  172 QT Interval:  417 QTC Calculation: 554 R Axis:   -33  Text Interpretation: Atrial fibrillation Left bundle branch block likely paced Confirmed by Glynn Octave [phone]) on 05/20/2023 10:58:55 PM  Radiology No results found.  Procedures Procedures  {Document cardiac monitor, telemetry assessment procedure when appropriate:1}  Medications Ordered in ED Medications - No data to display  ED Course/ Medical Decision Making/ A&P   {   Click here for ABCD2, HEART and other calculatorsREFRESH Note before signing :1}                              Medical Decision Making Amount and/or Complexity of Data Reviewed Labs: ordered. Decision-making details documented in ED Course. Radiology: ordered and independent interpretation performed. Decision-making details documented in ED Course. ECG/medicine tests: ordered  and independent interpretation performed. Decision-making details documented in ED Course.   3 days of URI symptoms with cough, congestion, shortness of breath, subjective fever and chills, chest pain with coughing.  Hypoxic for EMS on room air.  No stridor but has rhonchi on arrival.  EKG is paced.  Infectious workup pursued.  Patient given IV fluids judiciously given her reduced ejection fraction.  Also given broad-spectrum antibiotics after cultures are obtained.  Concern for aspiration. {Document critical care time when appropriate:1} {Document review of labs and clinical decision tools ie heart score, Chads2Vasc2 etc:1}  {Document your independent review of radiology images, and any outside records:1} {Document your discussion with family members, caretakers, and with consultants:1} {Document social determinants of health affecting pt's care:1} {Document your decision making why or why not admission, treatments were needed:1} Final Clinical Impression(s) / ED Diagnoses Final diagnoses:  None    Rx / DC Orders ED Discharge Orders     None

## 2023-05-21 ENCOUNTER — Ambulatory Visit (INDEPENDENT_AMBULATORY_CARE_PROVIDER_SITE_OTHER): Payer: 59

## 2023-05-21 ENCOUNTER — Institutional Professional Consult (permissible substitution): Payer: 59 | Admitting: Licensed Clinical Social Worker

## 2023-05-21 ENCOUNTER — Ambulatory Visit: Payer: Medicare Other | Admitting: Neurology

## 2023-05-21 ENCOUNTER — Inpatient Hospital Stay (HOSPITAL_COMMUNITY): Payer: 59

## 2023-05-21 DIAGNOSIS — R1310 Dysphagia, unspecified: Secondary | ICD-10-CM | POA: Diagnosis present

## 2023-05-21 DIAGNOSIS — J9621 Acute and chronic respiratory failure with hypoxia: Secondary | ICD-10-CM | POA: Diagnosis present

## 2023-05-21 DIAGNOSIS — F39 Unspecified mood [affective] disorder: Secondary | ICD-10-CM | POA: Diagnosis present

## 2023-05-21 DIAGNOSIS — A419 Sepsis, unspecified organism: Secondary | ICD-10-CM | POA: Diagnosis present

## 2023-05-21 DIAGNOSIS — I5043 Acute on chronic combined systolic (congestive) and diastolic (congestive) heart failure: Secondary | ICD-10-CM | POA: Diagnosis not present

## 2023-05-21 DIAGNOSIS — I495 Sick sinus syndrome: Secondary | ICD-10-CM | POA: Diagnosis not present

## 2023-05-21 DIAGNOSIS — G7111 Myotonic muscular dystrophy: Secondary | ICD-10-CM | POA: Diagnosis present

## 2023-05-21 DIAGNOSIS — E861 Hypovolemia: Secondary | ICD-10-CM | POA: Diagnosis present

## 2023-05-21 DIAGNOSIS — I5022 Chronic systolic (congestive) heart failure: Secondary | ICD-10-CM | POA: Diagnosis present

## 2023-05-21 DIAGNOSIS — G709 Myoneural disorder, unspecified: Secondary | ICD-10-CM | POA: Diagnosis present

## 2023-05-21 DIAGNOSIS — E890 Postprocedural hypothyroidism: Secondary | ICD-10-CM | POA: Diagnosis present

## 2023-05-21 DIAGNOSIS — J9601 Acute respiratory failure with hypoxia: Secondary | ICD-10-CM | POA: Diagnosis not present

## 2023-05-21 DIAGNOSIS — Z79899 Other long term (current) drug therapy: Secondary | ICD-10-CM | POA: Diagnosis not present

## 2023-05-21 DIAGNOSIS — Z681 Body mass index (BMI) 19 or less, adult: Secondary | ICD-10-CM | POA: Diagnosis not present

## 2023-05-21 DIAGNOSIS — J440 Chronic obstructive pulmonary disease with acute lower respiratory infection: Secondary | ICD-10-CM | POA: Diagnosis present

## 2023-05-21 DIAGNOSIS — I459 Conduction disorder, unspecified: Secondary | ICD-10-CM | POA: Diagnosis present

## 2023-05-21 DIAGNOSIS — Z1152 Encounter for screening for COVID-19: Secondary | ICD-10-CM | POA: Diagnosis not present

## 2023-05-21 DIAGNOSIS — I509 Heart failure, unspecified: Secondary | ICD-10-CM

## 2023-05-21 DIAGNOSIS — J189 Pneumonia, unspecified organism: Secondary | ICD-10-CM | POA: Diagnosis present

## 2023-05-21 DIAGNOSIS — R6520 Severe sepsis without septic shock: Secondary | ICD-10-CM | POA: Diagnosis present

## 2023-05-21 DIAGNOSIS — Z931 Gastrostomy status: Secondary | ICD-10-CM | POA: Diagnosis not present

## 2023-05-21 DIAGNOSIS — E43 Unspecified severe protein-calorie malnutrition: Secondary | ICD-10-CM | POA: Diagnosis present

## 2023-05-21 DIAGNOSIS — J690 Pneumonitis due to inhalation of food and vomit: Secondary | ICD-10-CM | POA: Diagnosis present

## 2023-05-21 DIAGNOSIS — G8929 Other chronic pain: Secondary | ICD-10-CM | POA: Diagnosis present

## 2023-05-21 DIAGNOSIS — K219 Gastro-esophageal reflux disease without esophagitis: Secondary | ICD-10-CM | POA: Diagnosis present

## 2023-05-21 DIAGNOSIS — Z79890 Hormone replacement therapy (postmenopausal): Secondary | ICD-10-CM | POA: Diagnosis not present

## 2023-05-21 DIAGNOSIS — D573 Sickle-cell trait: Secondary | ICD-10-CM | POA: Diagnosis present

## 2023-05-21 DIAGNOSIS — R64 Cachexia: Secondary | ICD-10-CM | POA: Diagnosis present

## 2023-05-21 LAB — BASIC METABOLIC PANEL
Anion gap: 10 (ref 5–15)
BUN: 5 mg/dL — ABNORMAL LOW (ref 6–20)
CO2: 30 mmol/L (ref 22–32)
Calcium: 8.7 mg/dL — ABNORMAL LOW (ref 8.9–10.3)
Chloride: 100 mmol/L (ref 98–111)
Creatinine, Ser: 0.51 mg/dL (ref 0.44–1.00)
GFR, Estimated: >60 mL/min (ref >60)
Glucose, Bld: 132 mg/dL — ABNORMAL HIGH (ref 70–99)
Potassium: 4.1 mmol/L (ref 3.5–5.1)
Sodium: 140 mmol/L (ref 135–145)

## 2023-05-21 LAB — CBC
HCT: 33.9 % — ABNORMAL LOW (ref 36.0–46.0)
Hemoglobin: 10.6 g/dL — ABNORMAL LOW (ref 12.0–15.0)
MCH: 29.8 pg (ref 26.0–34.0)
MCHC: 31.3 g/dL (ref 30.0–36.0)
MCV: 95.2 fL (ref 80.0–100.0)
Platelets: 339 10*3/uL (ref 150–400)
RBC: 3.56 MIL/uL — ABNORMAL LOW (ref 3.87–5.11)
RDW: 14.1 % (ref 11.5–15.5)
WBC: 22.8 10*3/uL — ABNORMAL HIGH (ref 4.0–10.5)
nRBC: 0 % (ref 0.0–0.2)

## 2023-05-21 LAB — ECHOCARDIOGRAM COMPLETE
Area-P 1/2: 4.41 cm2
Calc EF: 48.6 %
Height: 63 in
MV M vel: 1.07 m/s
MV Peak grad: 4.6 mmHg
S' Lateral: 3.1 cm
Single Plane A2C EF: 50.7 %
Single Plane A4C EF: 52.5 %
Weight: 1409.6 [oz_av]

## 2023-05-21 LAB — SARS CORONAVIRUS 2 BY RT PCR: SARS Coronavirus 2 by RT PCR: NEGATIVE

## 2023-05-21 LAB — LACTIC ACID, PLASMA
Lactic Acid, Venous: 2 mmol/L (ref 0.5–1.9)
Lactic Acid, Venous: 1.2 mmol/L (ref 0.5–1.9)

## 2023-05-21 LAB — PHOSPHORUS: Phosphorus: 1.9 mg/dL — ABNORMAL LOW (ref 2.5–4.6)

## 2023-05-21 LAB — MAGNESIUM: Magnesium: 1.8 mg/dL (ref 1.7–2.4)

## 2023-05-21 LAB — TROPONIN I (HIGH SENSITIVITY): Troponin I (High Sensitivity): 12 ng/L (ref <18)

## 2023-05-21 LAB — MRSA NEXT GEN BY PCR, NASAL: MRSA by PCR Next Gen: DETECTED — AB

## 2023-05-21 MED ORDER — LACTATED RINGERS IV BOLUS
500.0000 mL | Freq: Once | INTRAVENOUS | Status: AC
  Administered 2023-05-21: 500 mL via INTRAVENOUS

## 2023-05-21 MED ORDER — QUETIAPINE FUMARATE 25 MG PO TABS
25.0000 mg | ORAL_TABLET | Freq: Every day | ORAL | Status: DC
  Administered 2023-05-21 – 2023-05-24 (×5): 25 mg
  Filled 2023-05-21 (×5): qty 1

## 2023-05-21 MED ORDER — ALBUTEROL SULFATE (2.5 MG/3ML) 0.083% IN NEBU
2.5000 mg | INHALATION_SOLUTION | RESPIRATORY_TRACT | Status: DC
  Administered 2023-05-21: 2.5 mg via RESPIRATORY_TRACT
  Filled 2023-05-21 (×2): qty 3

## 2023-05-21 MED ORDER — METRONIDAZOLE 500 MG PO TABS
500.0000 mg | ORAL_TABLET | Freq: Three times a day (TID) | ORAL | Status: DC
  Administered 2023-05-21: 500 mg
  Filled 2023-05-21: qty 1

## 2023-05-21 MED ORDER — POTASSIUM CHLORIDE 10 MEQ/100ML IV SOLN
10.0000 meq | INTRAVENOUS | Status: AC
  Administered 2023-05-21 (×4): 10 meq via INTRAVENOUS
  Filled 2023-05-21 (×4): qty 100

## 2023-05-21 MED ORDER — LACTATED RINGERS IV BOLUS
1000.0000 mL | Freq: Once | INTRAVENOUS | Status: DC
Start: 2023-05-21 — End: 2023-05-21

## 2023-05-21 MED ORDER — LEVOTHYROXINE SODIUM 112 MCG PO TABS
112.0000 ug | ORAL_TABLET | Freq: Every day | ORAL | Status: DC
  Administered 2023-05-21 – 2023-05-25 (×5): 112 ug
  Filled 2023-05-21 (×5): qty 1

## 2023-05-21 MED ORDER — ENOXAPARIN SODIUM 30 MG/0.3ML IJ SOSY
30.0000 mg | PREFILLED_SYRINGE | INTRAMUSCULAR | Status: DC
  Administered 2023-05-22 – 2023-05-25 (×4): 30 mg via SUBCUTANEOUS
  Filled 2023-05-21 (×5): qty 0.3

## 2023-05-21 MED ORDER — SODIUM CHLORIDE 0.9 % IV SOLN
2.0000 g | Freq: Two times a day (BID) | INTRAVENOUS | Status: DC
  Administered 2023-05-21 – 2023-05-22 (×2): 2 g via INTRAVENOUS
  Filled 2023-05-21 (×2): qty 12.5

## 2023-05-21 MED ORDER — ALBUTEROL SULFATE (2.5 MG/3ML) 0.083% IN NEBU: 2.5000 mg | INHALATION_SOLUTION | RESPIRATORY_TRACT | Status: DC | PRN

## 2023-05-21 MED ORDER — SODIUM CHLORIDE 0.9 % IV SOLN
2.0000 g | Freq: Three times a day (TID) | INTRAVENOUS | Status: DC
  Administered 2023-05-21: 2 g via INTRAVENOUS
  Filled 2023-05-21: qty 12.5

## 2023-05-21 MED ORDER — IPRATROPIUM-ALBUTEROL 0.5-2.5 (3) MG/3ML IN SOLN
3.0000 mL | Freq: Four times a day (QID) | RESPIRATORY_TRACT | Status: DC
  Administered 2023-05-21 – 2023-05-22 (×2): 3 mL via RESPIRATORY_TRACT
  Filled 2023-05-21 (×2): qty 3

## 2023-05-21 MED ORDER — SODIUM CHLORIDE 0.9% FLUSH
3.0000 mL | Freq: Two times a day (BID) | INTRAVENOUS | Status: DC
  Administered 2023-05-21 – 2023-05-25 (×6): 3 mL via INTRAVENOUS

## 2023-05-21 MED ORDER — SODIUM CHLORIDE 0.9 % IV SOLN: INTRAVENOUS | Status: DC

## 2023-05-21 MED ORDER — DIGOXIN 125 MCG PO TABS
250.0000 ug | ORAL_TABLET | Freq: Every day | ORAL | Status: DC
  Administered 2023-05-21 – 2023-05-25 (×5): 250 ug
  Filled 2023-05-21 (×5): qty 2

## 2023-05-21 MED ORDER — METRONIDAZOLE 500 MG PO TABS
500.0000 mg | ORAL_TABLET | Freq: Two times a day (BID) | ORAL | Status: DC
  Administered 2023-05-21 – 2023-05-25 (×9): 500 mg
  Filled 2023-05-21 (×9): qty 1

## 2023-05-21 MED ORDER — TRAMADOL HCL 50 MG PO TABS
50.0000 mg | ORAL_TABLET | Freq: Three times a day (TID) | ORAL | Status: DC | PRN
  Administered 2023-05-22 – 2023-05-24 (×2): 50 mg
  Filled 2023-05-21 (×3): qty 1

## 2023-05-21 MED ORDER — ACETAMINOPHEN 500 MG PO TABS
1000.0000 mg | ORAL_TABLET | Freq: Four times a day (QID) | ORAL | Status: DC | PRN
  Administered 2023-05-22: 1000 mg
  Filled 2023-05-21 (×2): qty 2

## 2023-05-21 MED ORDER — POTASSIUM CHLORIDE 10 MEQ/100ML IV SOLN
10.0000 meq | Freq: Once | INTRAVENOUS | Status: AC
  Administered 2023-05-21: 10 meq via INTRAVENOUS
  Filled 2023-05-21: qty 100

## 2023-05-21 MED ORDER — SODIUM PHOSPHATES 45 MMOLE/15ML IV SOLN
15.0000 mmol | Freq: Once | INTRAVENOUS | Status: DC
  Filled 2023-05-21: qty 5

## 2023-05-21 MED ORDER — VANCOMYCIN HCL 750 MG/150ML IV SOLN
750.0000 mg | INTRAVENOUS | Status: DC
  Administered 2023-05-21: 750 mg via INTRAVENOUS
  Filled 2023-05-21: qty 150

## 2023-05-21 MED ORDER — ENOXAPARIN SODIUM 40 MG/0.4ML IJ SOSY: 40.0000 mg | PREFILLED_SYRINGE | INTRAMUSCULAR | Status: DC

## 2023-05-21 NOTE — Progress Notes (Signed)
*  PRELIMINARY RESULTS* Echocardiogram 2D Echocardiogram has been performed.  Cynthia Underwood 05/21/2023, 2:23 PM

## 2023-05-21 NOTE — Progress Notes (Signed)
PHARMACY NOTE:  ANTIMICROBIAL RENAL DOSAGE ADJUSTMENT  Current antimicrobial regimen includes a mismatch between antimicrobial dosage and estimated renal function.  As per policy approved by the Pharmacy & Therapeutics and Medical Executive Committees, the antimicrobial dosage will be adjusted accordingly.  Current antimicrobial dosage:  Cefepime 2g IV Q8H AND Metronidazole 500 mg PT Q8H   Indication:   Renal Function:  Estimated Creatinine Clearance: 51.4 mL/min (by C-G formula based on SCr of 0.51 mg/dL). []      On intermittent HD, scheduled: []      On CRRT    Antimicrobial dosage has been changed to:  Cefepime 2g IV Q12H and Metronidazole 500 mg PT Q12H   Additional comments:   Thank you for allowing pharmacy to be a part of this patient's care.  Jani Gravel, PharmD Clinical Pharmacist  05/21/2023 10:19 AM

## 2023-05-21 NOTE — Evaluation (Signed)
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   Sickle cell trait (HCC)    Watery eyes    Left   Weakness of both legs    Past Surgical History:  Past Surgical History:  Procedure Laterality Date   BREAST BIOPSY Right    EYE SURGERY Left 2017   "related to blockage in my nose"   EYE SURGERY Right 05/2019   INSERT / REPLACE / REMOVE PACEMAKER  02/15/2017   IR GASTROSTOMY TUBE MOD SED  01/01/2023   PACEMAKER IMPLANT N/A 02/15/2017   Procedure: Pacemaker Implant;  Surgeon: Duke Salvia, MD;  Location: [facility] INVASIVE CV LAB;  Service: Cardiovascular;  Laterality: N/A;   RIGHT/LEFT HEART CATH AND CORONARY ANGIOGRAPHY N/A 11/16/2022   Procedure: RIGHT/LEFT HEART CATH AND CORONARY ANGIOGRAPHY;  Surgeon: Laurey Morale, MD;  Location: [facility] INVASIVE CV LAB;  Service: Cardiovascular;  Laterality: N/A;   THYROIDECTOMY N/A 04/21/2021   Procedure: TOTAL THYROIDECTOMY;  Surgeon: Axel Filler, MD;  Location: [facility] OR;  Service: General;  Laterality: N/A;   HPI:  MINEOLA NEIGER is a 52 yo female presenting to ED 9/16 with generalized pain and new onset of productive cough with green sputum. PEG in place since April 2024, although per note, pt states that she attains most of her nutrition by mouth and eats "regular food". CXR with new R mid lung opacity consistent with acute infiltrate. MBS 01/25/23 with severe dysphagia characterized by minimal movement of POs through the pharynx,  requiring multiple subswallows and resulting in almost total stasis of purees which required oral suctioning to clear. At that time, airway protection was surprisingly effective with no aspiration noted. SLP recommended NPO with small sips of water/ice chips per pt's preference. PMH includes myotonic dystrophy, c/b heart block, PPM, PNA and aspiration event resulting in VF arrest and requiring trach and PEG, HFrEF, hypothyroidism, COPD requiring supplemental O2 at night    Assessment / Plan / Recommendation  Clinical Impression  Pt reports consuming full liquid diet for ~1 month after previously discharging in May 2024. She then states that she gradually started having purees until progressing to eating regular texture solids for the past month. Oral motor exam revealed generalized oral weakness. She is quite lethargic today, requiring frequent cueing to participate in conversation and remain alert enough for PO trials. Given results of MBS 01/2023 with significant pharyngeal stasis as well as her lethargy, SLP only provided trials of thin liquids, with which pt exhibited multiple swallows and a delayed cough. Question ability to achieve pharyngeal mobility as well as continued airway protection with presence of diffuse pharyngeal residue. Recommend completing repeat MBS to further assess oropharyngeal strength and clearance in order to make more advanced diet recommendations. Given pt's lethargy and s/s of aspiration today, recommend she remain NPO pending results of MBS, which will be completed as she is able to remain adequately alert to participate. Will continue to follow. SLP Visit Diagnosis: Dysphagia, unspecified (  R13.10)    Aspiration Risk  Moderate aspiration risk    Diet Recommendation NPO;Ice chips PRN after oral care    Medication Administration: Via alternative means    Other  Recommendations Oral Care Recommendations: Oral care QID    Recommendations for follow up therapy are one  component of a multi-disciplinary discharge planning process, led by the attending physician.  Recommendations may be updated based on patient status, additional functional criteria and insurance authorization.  Follow up Recommendations Home health SLP      Assistance Recommended at Discharge    Functional Status Assessment Patient has had a recent decline in their functional status and demonstrates the ability to make significant improvements in function in a reasonable and predictable amount of time.  Frequency and Duration min 2x/week  2 weeks       Prognosis Prognosis for improved oropharyngeal function: Good Barriers to Reach Goals: Time post onset;Severity of deficits      Swallow Study   General HPI: ANNALISIA SEHR is a 52 yo female presenting to ED 9/16 with generalized pain and new onset of productive cough with green sputum. PEG in place since April 2024, although per note, pt states that she attains most of her nutrition by mouth and eats "regular food". CXR with new R mid lung opacity consistent with acute infiltrate. MBS 01/25/23 with severe dysphagia characterized by minimal movement of POs through the pharynx, requiring multiple subswallows and resulting in almost total stasis of purees which required oral suctioning to clear. At that time, airway protection was surprisingly effective with no aspiration noted. SLP recommended NPO with small sips of water/ice chips per pt's preference. PMH includes myotonic dystrophy, c/b heart block, PPM, PNA and aspiration event resulting in VF arrest and requiring trach and PEG, HFrEF, hypothyroidism, COPD requiring supplemental O2 at night Type of Study: Bedside Swallow Evaluation Previous Swallow Assessment: see HPI Diet Prior to this Study: NPO;G-tube Temperature Spikes Noted: No Respiratory Status: Room air History of Recent Intubation: No Behavior/Cognition: Cooperative;Lethargic/Drowsy;Requires cueing Oral Cavity Assessment:  Within Functional Limits Oral Care Completed by SLP: No Oral Cavity - Dentition: Adequate natural dentition Vision: Functional for self-feeding Self-Feeding Abilities: Able to feed self Patient Positioning: Upright in bed Baseline Vocal Quality: Low vocal intensity;Hoarse Volitional Cough: Weak Volitional Swallow: Able to elicit    Oral/Motor/Sensory Function Overall Oral Motor/Sensory Function: Generalized oral weakness   Ice Chips Ice chips: Not tested   Thin Liquid Thin Liquid: Impaired Presentation: Straw Pharyngeal  Phase Impairments: Multiple swallows;Cough - Delayed    Nectar Thick Nectar Thick Liquid: Not tested   Honey Thick Honey Thick Liquid: Not tested   Puree Puree: Not tested   Solid     Solid: Not tested      Gwynneth Aliment, M.A., CF-SLP Speech Language Pathology, Acute Rehabilitation Services  Secure Chat preferred [phone]  05/21/2023,1:49 PM

## 2023-05-21 NOTE — H&P (Signed)
History and Physical    Cynthia Underwood HYQ:[id number] DOB: June 20, 1971 DOA: 05/20/2023  PCP: Hoy Register, MD   Patient coming from: Home   Chief Complaint:  Chief Complaint  Patient presents with   Weakness   Cough    HPI:  Cynthia Underwood is a 52 y.o. female with hx of myotonic dystrophy, c/b heart block, PPM, pneumonia and aspiration event resulting in VF arrest requiring trach and PEG, remains with PEG tube and trach has been taken down, HFrEF, hypothyroidism, who presented with generalized pain and new cough with green sputum.  Ongoing cough and sputum times few days.  She does not identify a single aspiration event.  Says sometimes that she does have coughing or difficulty swallowing with food.  She has been taking in most of her nutrition by mouth and eats "regular food".  Appears to really using the PEG tube for nutrition.  No significant dyspnea at rest.  No chest pain   Review of Systems:  ROS complete and negative except as marked above   Allergies  Allergen Reactions   Penicillin G Nausea And Vomiting, Rash and Other (See Comments)    Sore throat Has had cephalosporins in past   Penicillins Nausea And Vomiting, Rash and Other (See Comments)    Fever Has patient had a PCN reaction causing immediate rash, facial/tongue/throat swelling, SOB or lightheadedness with hypotension: Yes Has patient had a PCN reaction causing severe rash involving mucus membranes or skin necrosis: Unknown Has patient had a PCN reaction that required hospitalization: No Has patient had a PCN reaction occurring within the last 10 years: No If all of the above answers are "NO", then may proceed with Cephalosporin use.     Prior to Admission medications   Medication Sig Start Date End Date Taking? Authorizing Provider  acetaminophen (TYLENOL) 500 MG tablet Place 500 mg into feeding tube every 6 (six) hours as needed for mild pain.   Yes [provider]  digoxin (LANOXIN) 0.25  MG tablet Place 250 mcg into feeding tube daily. 04/20/23  Yes [provider]  Ensure (ENSURE) Take 237 mLs by mouth in the morning, at noon, and at bedtime.   Yes [provider]  guaifenesin (HUMIBID E) 400 MG TABS tablet 400 mg 3 (three) times daily. Place 400mg  into feeding tube three times a day 01/19/23  Yes [provider]  ipratropium-albuterol (DUONEB) 0.5-2.5 (3) MG/3ML SOLN Inhale 3 mLs into the lungs every 4 (four) hours as needed. SMARTSIG:1 Unspecified Via Inhaler Every 4 Hours PRN 05/09/23  Yes Martina Sinner, MD  levothyroxine (SYNTHROID) 112 MCG tablet Place 1 tablet (112 mcg total) into feeding tube daily before breakfast. 12/07/22  Yes Doran Stabler, DO  metoCLOPramide (REGLAN) 5 MG tablet Place 5 mg into feeding tube. 01/19/23  Yes [provider]  metoprolol tartrate (LOPRESSOR) 25 MG tablet Place 12.5 mg into feeding tube 2 (two) times daily. 01/19/23  Yes [provider]  pantoprazole (PROTONIX) 40 MG tablet 40 mg daily. Place 40mg  into the feeding tube daily   Yes [provider]  QUEtiapine (SEROQUEL) 25 MG tablet Place 1 tablet (25 mg total) into feeding tube at bedtime. 12/06/22  Yes Doran Stabler, DO  scopolamine (TRANSDERM-SCOP) 1 MG/3DAYS Place 1 patch onto the skin every 3 (three) days. 01/22/23  Yes [provider]  spironolactone (ALDACTONE) 25 MG tablet Place 0.5 tablets (12.5 mg total) into feeding tube daily. 12/07/22  Yes Doran Stabler, DO  traMADol Janean Sark) 50  MG tablet Place 50 mg into feeding tube every 8 (eight) hours as needed. 01/20/23  Yes [provider]  Water For Irrigation, Sterile (FREE WATER) SOLN Place 150 mLs into feeding tube 3 (three) times daily. 01/30/23  Yes Leroy Sea, MD  hydrocortisone-pramoxine [facility]) 2.5-1 % rectal cream Place 1 Application rectally 3 (three) times daily. Patient not taking: Reported on 05/21/2023 05/16/23   Hoy Register, MD  loperamide (IMODIUM) 2 MG  capsule Take 1 capsule (2 mg total) by mouth every 6 (six) hours as needed for diarrhea or loose stools. Patient not taking: Reported on 05/21/2023 01/30/23   Leroy Sea, MD  Protein (FEEDING SUPPLEMENT, PROSOURCE TF20,) liquid Place 60 mLs into feeding tube daily. Patient not taking: Reported on 05/21/2023 12/07/22   Doran Stabler, DO    Past Medical History:  Diagnosis Date   Bifascicular block 10/02/2014   Cataract    Dyspnea    Excess ear wax    Multinodular goiter    Pneumonia    Presence of permanent cardiac pacemaker    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   Sickle cell trait (HCC)    Watery eyes    Left   Weakness of both legs     Past Surgical History:  Procedure Laterality Date   BREAST BIOPSY Right    EYE SURGERY Left 2017   "related to blockage in my nose"   EYE SURGERY Right 05/2019   INSERT / REPLACE / REMOVE PACEMAKER  02/15/2017   IR GASTROSTOMY TUBE MOD SED  01/01/2023   PACEMAKER IMPLANT N/A 02/15/2017   Procedure: Pacemaker Implant;  Surgeon: Duke Salvia, MD;  Location: [facility] INVASIVE CV LAB;  Service: Cardiovascular;  Laterality: N/A;   RIGHT/LEFT HEART CATH AND CORONARY ANGIOGRAPHY N/A 11/16/2022   Procedure: RIGHT/LEFT HEART CATH AND CORONARY ANGIOGRAPHY;  Surgeon: Laurey Morale, MD;  Location: [facility] INVASIVE CV LAB;  Service: Cardiovascular;  Laterality: N/A;   THYROIDECTOMY N/A 04/21/2021   Procedure: TOTAL THYROIDECTOMY;  Surgeon: Axel Filler, MD;  Location: [facility] OR;  Service: General;  Laterality: N/A;     reports that she quit smoking about 6 years ago. Her smoking use included cigarettes. She started smoking about 34 years ago. She has never used smokeless tobacco. She reports that she does not currently use alcohol. She reports that she does not currently use drugs after having used the following drugs: Marijuana.  Family History  Problem Relation Age of Onset   Cancer Father 63       Deceased   Heart  disease Father    COPD Mother    Diabetes Maternal Uncle    Heart disease Maternal Uncle    Heart disease Paternal Grandmother    Diabetes Paternal Grandmother    Hypertension Paternal Grandmother    Neuromuscular disorder Maternal Aunt        Myotonic dystrophy   Neuromuscular disorder Cousin        Myotonic dystrophy   Neuromuscular disorder Sister        Myotonic dystrophy   Colon cancer Neg Hx    Colon polyps Neg Hx    Esophageal cancer Neg Hx    Rectal cancer Neg Hx    Stomach cancer Neg Hx      Physical Exam: Vitals:   05/21/23 0000 05/21/23 0030 05/21/23 0130 05/21/23 0222  BP: 103/69 101/66 111/72   Pulse:  (!) 106 (!) 108   Resp: 19 (!) 23 19  Temp:    99.6 F (37.6 C)  TempSrc:    Oral  SpO2:  100% 100%     Gen: Awake, alert, acute and chronically ill-appearing.  Cachectic CV: Regular, normal S1, S2, 1 out of 6 SEM Resp: Normal WOB, on 2 L nasal cannula, there is diminished expansion, rales right anterior field Abd: Flat, normoactive, nontender MSK: Sarcopenia.  Symmetric, no edema  Skin: No rashes or lesions to exposed skin  Neuro: Alert and interactive  Psych: euthymic, appropriate, insight limited   Data review:   Labs reviewed, notable for:  Lactate 3.1, repeat down to 2 WBC 15  Micro:  Results for orders placed or performed during the hospital encounter of 05/20/23  SARS Coronavirus 2 by RT PCR (hospital order, performed in [facility] hospital lab) *cepheid single result test* Anterior Nasal Swab     Status: None   Collection Time: 05/20/23 11:30 PM   Specimen: Anterior Nasal Swab  Result Value Ref Range Status   SARS Coronavirus 2 by RT PCR NEGATIVE NEGATIVE Final    Comment: Performed at [facility] Lab, [address] [place], [place] [address]    Imaging reviewed:  DG Chest 2 View  Result Date: 05/20/2023 CLINICAL DATA:  Shortness of breath EXAM: CHEST - 2 VIEW COMPARISON:  01/27/2023 FINDINGS: Cardiac shadow is mildly enlarged.  Pacing device is again seen and stable. Lungs are well aerated bilaterally. Previously seen lower lobe collapse has predominantly resolved. Residual opacity in the right lower lobe is noted. There is a linear area of airspace opacity in the right mid lung new from the prior exam. This likely represents acute infiltrate. Postsurgical changes at the thoracic inlet are noted. No bony abnormality is seen. IMPRESSION: Near complete resolution of previously seen bibasilar consolidation. Only mild residual opacity in the right lower lobe is noted. New right mid lung opacity consistent with acute infiltrate. Electronically Signed   By: Alcide Clever M.D.   On: 05/20/2023 23:19    EKG: V-paced, question underlying flutter waves.  ED Course: Treated with vancomycin, cefepime, 500 cc IV fluids   Assessment/Plan:  52 y.o. female with hx  hx of myotonic dystrophy, c/b heart block, PPM, pneumonia and aspiration event resulting in VF arrest requiring trach and PEG, remains with PEG tube and trach has been taken down, HFrEF, hypothyroidism, who was admitted for recurrent aspiration pneumonia.   Recurrent aspiration pneumonia, associated myotonic dystrophy Acute on chronic hypoxic respiratory failure (home 2 L at night) Sepsis, secondary to above, present on admission-improving On EMS arrival hypoxic into the high 80s requiring 2 L O2.  New cough with green sputum, recent change in diet taking more nutrition orally.  Chest x-ray with a new right middle lobe pneumonia.  Associated sepsis with criteria tachycardia, leukocytosis.  Lactate 3.1, improving with IV fluids -Status post 500 cc IV fluid with ED, given another 1 L IV fluid.  Initial fluid repletion was done at conservative pace due to her history of heart failure -Trend lactate until normalized, improving with fluids and no worsening hypoxia -Continue vancomycin pharmacy to dose, cefepime 2 g IV every 8 hours, add metronidazole 500 mg every 8 hours per G-tube  due to concern for aspiration pneumonia -Check respiratory culture, MRSA nares, de-escalate from Vanc if MRSA nares negative -Aggressive pulmonary toilet, incentive spirometer, chest PT, out of bed to chair -Albuterol nebs scheduled every 4 hours, consider hypertonic nebs if difficulty clearing secretions -Consider neurology consultation due to relationship with her underlying  myotonic dystrophy -Consider pulmonology evaluation for respiratory insufficiency associated with her neuromuscular disease -SLP evaluation, for now n.p.o.  Chronic medical problems Heart failure with reduced ejection fraction: Last TTE in system at time of her VF arrest admission when EF was 20%.  Continue home digoxin.  Hold home metoprolol, spironolactone due to sepsis.  Per chart review in past BP too low for Entresto. repeat echo while inpatient.  Hypothyroidism: Continue home levothyroxine ?  Mood disorder: Continue home Seroquel Chronic pain: Hold home tramadol, and avoid CNS depressing medications in the setting of recurrent aspiration events.  There is no height or weight on file to calculate BMI.    DVT prophylaxis:  Lovenox Code Status:  Full Code; confirmed with patient Diet:  Diet Orders (From admission, onward)     Start     Ordered   05/21/23 0138  Diet NPO time specified Except for: Other (See Comments)  Diet effective now       Comments: Swabs for comfort  Question:  Except for  Answer:  Other (See Comments)   05/21/23 0140           Family Communication: Discussed with her sister and POA Consults: None Admission status:   Inpatient, Telemetry bed  Severity of Illness: The appropriate patient status for this patient is INPATIENT. Inpatient status is judged to be reasonable and necessary in order to provide the required intensity of service to ensure the patient's safety. The patient's presenting symptoms, physical exam findings, and initial radiographic and laboratory data in the context of  their chronic comorbidities is felt to place them at high risk for further clinical deterioration. Furthermore, it is not anticipated that the patient will be medically stable for discharge from the hospital within 2 midnights of admission.   * I certify that at the point of admission it is my clinical judgment that the patient will require inpatient hospital care spanning beyond 2 midnights from the point of admission due to high intensity of service, high risk for further deterioration and high frequency of surveillance required.*   Dolly Rias, MD [facility]  How to contact the [facility] Attending or Consulting provider 7A - 7P or covering provider during after hours 7P -7A, for this patient.  Check the care team in [facility] and look for a) attending/consulting TRH provider listed and b) the [facility] team listed Log into [email or web address] and use [place]'s universal password to access. If you do not have the password, please contact the hospital operator. Locate the [facility] provider you are looking for under [facility] and page to a number that you can be directly reached. If you still have difficulty reaching the provider, please page the [facility] (Director on Call) for the Hospitalists listed on amion for assistance.  05/21/2023, 3:54 AM

## 2023-05-21 NOTE — Progress Notes (Signed)
Patient arrived to the room with her phone, clothes, earrings, and top and bottom dentures in place. Patient ambulated to the bathroom x1 assist.  Patient is alert and oriented x4, reported ABD pain at PEG tube site 9/10. Patient was placed on tele monitor, called and verified placement with tele monitoring unit. Patient was oriented to the room and staffs, call bell within reach, bed alarm in place.  Cynthia Underwood

## 2023-05-21 NOTE — ED Notes (Signed)
Floor called to let them know patient was on her way. Lab called positive MRSA , called to [place] on 5 M

## 2023-05-21 NOTE — ED Notes (Signed)
Patient incont. Of urine patient cleaned and all bed linens changed.

## 2023-05-21 NOTE — ED Notes (Signed)
ED TO INPATIENT HANDOFF REPORT  ED Nurse Name and Phone #: Lucious Groves [phone]  S Name/Age/Gender Cynthia Underwood 52 y.o. female Room/Bed: 032C/032C  Code Status   Code Status: Prior  Home/SNF/Other Home Patient oriented to: self, place, time, and situation Is this baseline? Yes   Triage Complete: Triage complete  Chief Complaint shob  Triage Note Pt from home with ems, pt c.o weakness, malaise, body aches and productive cough. Pt not eating much lately. Has a feeding tube. Hx of pneumonia earlier this year, required an ICU stay and trach.  89% on room air 94% 2L   Rales noted all over with some wheezing in the upper lungs.  118/70   Allergies Allergies  Allergen Reactions   Penicillin G Nausea And Vomiting, Rash and Other (See Comments)    Sore throat Has had cephalosporins in past   Penicillins Nausea And Vomiting, Rash and Other (See Comments)    Fever Has patient had a PCN reaction causing immediate rash, facial/tongue/throat swelling, SOB or lightheadedness with hypotension: Yes Has patient had a PCN reaction causing severe rash involving mucus membranes or skin necrosis: Unknown Has patient had a PCN reaction that required hospitalization: No Has patient had a PCN reaction occurring within the last 10 years: No If all of the above answers are "NO", then may proceed with Cephalosporin use.     Level of Care/Admitting Diagnosis ED Disposition     ED Disposition  Admit   Condition  --   Comment  The patient appears reasonably stabilized for admission considering the current resources, flow, and capabilities available in the ED at this time, and I doubt any other [facility] requiring further screening and/or treatment in the ED prior to admission is  present.          B Medical/Surgery History Past Medical History:  Diagnosis Date   Bifascicular block 10/02/2014   Cataract    Dyspnea    Excess ear wax    Multinodular goiter    Pneumonia    Presence  of permanent cardiac pacemaker    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   Sickle cell trait (HCC)    Watery eyes    Left   Weakness of both legs    Past Surgical History:  Procedure Laterality Date   BREAST BIOPSY Right    EYE SURGERY Left 2017   "related to blockage in my nose"   EYE SURGERY Right 05/2019   INSERT / REPLACE / REMOVE PACEMAKER  02/15/2017   IR GASTROSTOMY TUBE MOD SED  01/01/2023   PACEMAKER IMPLANT N/A 02/15/2017   Procedure: Pacemaker Implant;  Surgeon: Duke Salvia, MD;  Location: [facility] INVASIVE CV LAB;  Service: Cardiovascular;  Laterality: N/A;   RIGHT/LEFT HEART CATH AND CORONARY ANGIOGRAPHY N/A 11/16/2022   Procedure: RIGHT/LEFT HEART CATH AND CORONARY ANGIOGRAPHY;  Surgeon: Laurey Morale, MD;  Location: [facility] INVASIVE CV LAB;  Service: Cardiovascular;  Laterality: N/A;   THYROIDECTOMY N/A 04/21/2021   Procedure: TOTAL THYROIDECTOMY;  Surgeon: Axel Filler, MD;  Location: [facility] OR;  Service: General;  Laterality: N/A;     A IV Location/Drains/Wounds Patient Lines/Drains/Airways Status     Active Line/Drains/Airways     Name Placement date Placement time Site Days   Peripheral IV 05/20/23 20 G Left;Distal Forearm 05/20/23  2252  Forearm  1   Peripheral IV 05/20/23 22 G Right Wrist 05/20/23  2252  Wrist  1   Gastrostomy/Enterostomy Percutaneous  endoscopic gastrostomy (PEG) 20 Fr. LUQ 01/01/23  0947  LUQ  140            Intake/Output Last 24 hours No intake or output data in the 24 hours ending 05/21/23 0105  Labs/Imaging Results for orders placed or performed during the hospital encounter of 05/20/23 (from the past 48 hour(s))  Lipase, blood     Status: None   Collection Time: 05/20/23 10:55 PM  Result Value Ref Range   Lipase 21 11 - 51 U/L    Comment: Performed at [facility] Lab, [address] [place], [place] [address]  Brain natriuretic peptide     Status: None   Collection Time: 05/20/23 10:55  PM  Result Value Ref Range   B Natriuretic Peptide 59.8 0.0 - 100.0 pg/mL    Comment: Performed at [facility] Lab, [address], [place], [place] [address]  Troponin I (High Sensitivity)     Status: None   Collection Time: 05/20/23 10:55 PM  Result Value Ref Range   Troponin I (High Sensitivity) 14 <18 ng/L    Comment: (NOTE) Elevated high sensitivity troponin I (hsTnI) values and significant  changes across serial measurements may suggest ACS but many other  chronic and acute conditions are known to elevate hsTnI results.  Refer to the "Links" section for chest pain algorithms and additional  guidance. Performed at [facility] Lab, [address], [place], [place] [address]   CBC with Differential     Status: Abnormal   Collection Time: 05/20/23 10:56 PM  Result Value Ref Range   WBC 15.5 (H) 4.0 - 10.5 K/uL   RBC 4.57 3.87 - 5.11 MIL/uL   Hemoglobin 13.5 12.0 - 15.0 g/dL   HCT 82.9 56.2 - 13.0 %   MCV 96.1 80.0 - 100.0 fL   MCH 29.5 26.0 - 34.0 pg   MCHC 30.8 30.0 - 36.0 g/dL   RDW 86.5 78.4 - 69.6 %   Platelets 309 150 - 400 K/uL   nRBC 0.0 0.0 - 0.2 %   Neutrophils Relative % 84 %   Neutro Abs 12.9 (H) 1.7 - 7.7 K/uL   Lymphocytes Relative 12 %   Lymphs Abs 1.8 0.7 - 4.0 K/uL   Monocytes Relative 4 %   Monocytes Absolute 0.6 0.1 - 1.0 K/uL   Eosinophils Relative 0 %   Eosinophils Absolute 0.1 0.0 - 0.5 K/uL   Basophils Relative 0 %   Basophils Absolute 0.0 0.0 - 0.1 K/uL   Immature Granulocytes 0 %   Abs Immature Granulocytes 0.06 0.00 - 0.07 K/uL    Comment: Performed at [facility] Lab, [address] [place], [place] [address]  Comprehensive metabolic panel     Status: Abnormal   Collection Time: 05/20/23 10:56 PM  Result Value Ref Range   Sodium 138 135 - 145 mmol/L   Potassium 3.6 3.5 - 5.1 mmol/L   Chloride 97 (L) 98 - 111 mmol/L   CO2 28 22 - 32 mmol/L   Glucose, Bld 105 (H) 70 - 99 mg/dL    Comment: Glucose reference range applies only to  samples taken after fasting for at least 8 hours.   BUN 7 6 - 20 mg/dL   Creatinine, Ser 4.13 0.44 - 1.00 mg/dL   Calcium 9.4 8.9 - 24.4 mg/dL   Total Protein 8.7 (H) 6.5 - 8.1 g/dL   Albumin 3.0 (L) 3.5 - 5.0 g/dL   AST 23 15 - 41  U/L   ALT 13 0 - 44 U/L   Alkaline Phosphatase 73 38 - 126 U/L   Total Bilirubin 0.7 0.3 - 1.2 mg/dL   GFR, Estimated >16 >10 mL/min    Comment: (NOTE) Calculated using the CKD-EPI Creatinine Equation (2021)    Anion gap 13 5 - 15    Comment: Performed at [facility] Lab, [address], [place], [place] [address]  Protime-INR     Status: Abnormal   Collection Time: 05/20/23 10:56 PM  Result Value Ref Range   Prothrombin Time 15.9 (H) 11.4 - 15.2 seconds   INR 1.3 (H) 0.8 - 1.2    Comment: (NOTE) INR goal varies based on device and disease states. Performed at [facility] Lab, [address], [place], [place] [address]   Lactic acid, plasma     Status: Abnormal   Collection Time: 05/20/23 10:56 PM  Result Value Ref Range   Lactic Acid, Venous 3.1 (HH) 0.5 - 1.9 mmol/L    Comment: CRITICAL RESULT CALLED TO, READ BACK BY AND VERIFIED WITH Renetta Chalk RN @ [phone] 05/20/23 JBUTLER Performed at [facility] Lab, [address] [place], [place] [address]   Urinalysis, Routine w reflex microscopic -Urine, Clean Catch     Status: None   Collection Time: 05/20/23 11:30 PM  Result Value Ref Range   Color, Urine YELLOW YELLOW   APPearance CLEAR CLEAR   Specific Gravity, Urine 1.015 1.005 - 1.030   pH 7.0 5.0 - 8.0   Glucose, UA NEGATIVE NEGATIVE mg/dL   Hgb urine dipstick NEGATIVE NEGATIVE   Bilirubin Urine NEGATIVE NEGATIVE   Ketones, ur NEGATIVE NEGATIVE mg/dL   Protein, ur NEGATIVE NEGATIVE mg/dL   Nitrite NEGATIVE NEGATIVE   Leukocytes,Ua NEGATIVE NEGATIVE    Comment: Microscopic not done on urines with negative protein, blood, leukocytes, nitrite, or glucose < 500 mg/dL. Performed at [facility] Lab, [address] [place], [place]  [address]   SARS Coronavirus 2 by RT PCR (hospital order, performed in [facility] hospital lab) *cepheid single result test* Anterior Nasal Swab     Status: None   Collection Time: 05/20/23 11:30 PM   Specimen: Anterior Nasal Swab  Result Value Ref Range   SARS Coronavirus 2 by RT PCR NEGATIVE NEGATIVE    Comment: Performed at [facility] Lab, [address], [place], [place] [address]   DG Chest 2 View  Result Date: 05/20/2023 CLINICAL DATA:  Shortness of breath EXAM: CHEST - 2 VIEW COMPARISON:  01/27/2023 FINDINGS: Cardiac shadow is mildly enlarged. Pacing device is again seen and stable. Lungs are well aerated bilaterally. Previously seen lower lobe collapse has predominantly resolved. Residual opacity in the right lower lobe is noted. There is a linear area of airspace opacity in the right mid lung new from the prior exam. This likely represents acute infiltrate. Postsurgical changes at the thoracic inlet are noted. No bony abnormality is seen. IMPRESSION: Near complete resolution of previously seen bibasilar consolidation. Only mild residual opacity in the right lower lobe is noted. New right mid lung opacity consistent with acute infiltrate. Electronically Signed   By: Alcide Clever M.D.   On: 05/20/2023 23:19    Pending Labs Unresulted Labs (From admission, onward)     Start     Ordered   05/20/23 2251  Blood culture (routine x 2)  BLOOD CULTURE X 2,   R (with STAT occurrences)      05/20/23 2251  Vitals/Pain Today's Vitals   05/20/23 2306 05/20/23 2330 05/21/23 0000 05/21/23 0008  BP:  114/68 103/69   Pulse:      Resp:  18 19   Temp:      TempSrc:      SpO2:      PainSc: 0-No pain   0-No pain    Isolation Precautions Airborne and Contact precautions  Medications Medications  lactated ringers bolus 500 mL (0 mLs Intravenous Stopped 05/20/23 2355)  ceFEPIme (MAXIPIME) 2 g in sodium chloride 0.9 % 100 mL IVPB (0 g Intravenous Stopped 05/20/23 2355)  vancomycin  (VANCOCIN) IVPB 1000 mg/200 mL premix (0 mg Intravenous Stopped 05/21/23 0033)    Mobility walks     Focused Assessments     R Recommendations: See Admitting Provider Note  Report given to:   Additional Notes:

## 2023-05-21 NOTE — TOC CM/SW Note (Addendum)
Transition of Care [facility]) - Inpatient Brief Assessment   Patient Details  Name: Cynthia Underwood MRN: [id number] Date of Birth: 1971-03-18  Transition of Care [facility]) CM/SW Contact:    Tom-Johnson, Hershal Coria, RN Phone Number: 05/21/2023, 4:05 PM   Clinical Narrative:  Patient presented to the ED with Weakness, Malaise, Body Aches and Productive Cough. Has hx of Myotonic Dystrophy, Heart Block, PPM, Pneumonia and VF Arrest requiring trach and PEG, remains with PEG tube and Janina Mayo has been removed.  Admitted for Aspiration Pneumonia, on IV abx.  Patient was recently d/c'd from [facility] on 05/16/23.   From home with cousin, [name]. Does not have children, mother and four siblings supportive.  Patient on disability for Myotonic Dystrophy.  Does not drive, step mother and [name] transports to and from appointments.  Has all necessary DME's at home. Has home O2 from [employer].  PCP is Hoy Register, MD and uses [facility] on [address]   No TOC needs or recommendations noted at this time.  Patient not Medically ready for discharge.  CM will continue to follow as patient progresses with care towards discharge.                   Transition of Care Asessment: Insurance and Status: Insurance coverage has been reviewed Patient has primary care physician: Yes Home environment has been reviewed: Yes Prior level of function:: Modified Independent Prior/Current Home Services: No current home services Social Determinants of Health Reivew: SDOH reviewed no interventions necessary Readmission risk has been reviewed: Yes Transition of care needs: transition of care needs identified, TOC will continue to follow

## 2023-05-21 NOTE — Progress Notes (Signed)
Specimen Description BLOOD SITE NOT SPECIFIED  Final   Special Requests AEROBIC BOTTLE ONLY Blood Culture adequate volume  Final   Culture   Final    NO GROWTH < 12 HOURS Performed at [facility], [address] [place], [place] [address]    Report Status PENDING  Incomplete  SARS Coronavirus 2 by RT PCR (hospital order, performed in [facility] hospital lab) *cepheid single result test* Anterior Nasal Swab     Status: None   Collection Time: 05/20/23 11:30 PM   Specimen: Anterior Nasal Swab  Result Value Ref Range Status   SARS Coronavirus 2 by RT PCR NEGATIVE NEGATIVE Final    Comment: Performed at [facility], [address] [place], [place] [address]  MRSA Next Gen by PCR, Nasal     Status: Abnormal   Collection Time: 05/21/23  3:09 AM   Specimen: Nasal Mucosa; Nasal Swab  Result Value Ref Range Status   MRSA by PCR Next Gen DETECTED (A) NOT DETECTED Final    Comment: RESULT CALLED TO, READ BACK  BY AND VERIFIED WITH: Cynthia Underwood AT [id number] 05/21/2023 Cynthia Underwood (NOTE) The GeneXpert MRSA Assay (FDA approved for NASAL specimens only), is one component of a comprehensive MRSA colonization surveillance program. It is not intended to diagnose MRSA infection nor to guide or monitor treatment for MRSA infections. Test performance is not FDA approved in patients less than 32 years old. Performed at [facility], [address], [place], [place] [address]       Studies: ECHOCARDIOGRAM COMPLETE  Result Date: 05/21/2023    ECHOCARDIOGRAM REPORT   Patient Name:   Cynthia Underwood Date of Exam: 05/21/2023 Medical Rec #:  [id number]         Height:       63.0 in Accession #:    [id number]        Weight:       88.1 lb Date of Birth:  1971/07/03        BSA:          1.365 m Patient Age:    51 years          BP:           86/56 mmHg Patient Gender: F                 HR:           71 bpm. Exam Location:  Inpatient Procedure: 2D Echo, Cardiac Doppler and Color Doppler Indications:    Congestive Heart Failure I50.9  History:        Patient has prior history of Echocardiogram examinations, most                 recent 11/15/2022. CHF, Pacemaker, Arrythmias:RBBB; Risk                 Factors:Former Smoker.  Sonographer:    Aron Baba Referring Phys: [id number] [facility]  Sonographer Comments: Suboptimal subcostal window. Image acquisition challenging due to respiratory motion and Image acquisition challenging due to patient body habitus. IMPRESSIONS  1. Left ventricular ejection fraction, by estimation, is 55 to 60%. The left ventricle has normal function. The left ventricle has no regional wall motion abnormalities. Left ventricular diastolic parameters were normal.  2. Right ventricular systolic function is normal. The right ventricular size is normal. There is normal pulmonary artery systolic pressure.  3. A small pericardial effusion is present.  4. The mitral valve  PROGRESS NOTE  Cynthia Underwood KGM:[id number] DOB: 04-29-1971 DOA: 05/20/2023 PCP: Hoy Register, MD  HPI/Recap of past 24 hours: Cynthia Underwood is a 52 y.o. female with hx of myotonic dystrophy, c/b heart block, PPM, pneumonia and aspiration event resulting in VF arrest requiring trach and PEG, remains with PEG tube and trach has been taken down, HFrEF, hypothyroidism, who presented with new cough with green sputum production for the past few days. Reports some dysphagia as well as some coughing while eating. She has been taking in most of her nutrition by mouth and eats "regular food". She has had issues getting her tube-feeds. Pt admitted for further management.    Today, met pt and cousin (who lives with and takes care of her) at bedside. Reports pt doesn't really take aspiration precautions and lays flat shortly after eating. Reported having issues getting the tubefeeds, so pt has been eating regular food mostly be mouth which has been inadequate. They have been waiting to see SLP and a nutritionist. Pt reports some mild tenderness around PEG, noted dry skin around PEG, no erythema, drainage noted. Denies any chest pain, SOB, fever/chills.  Appears very dry    Assessment/Plan: Principal Problem:   Aspiration pneumonia (HCC)   Recurrent aspiration pneumonia, associated myotonic dystrophy Acute on chronic hypoxic respiratory failure (home 2 L at night) Hypoxic into the high 80s requiring 2 L O2 on arrival Currently on 2L of O2, plan to wean off to only nights Chest x-ray with a new right middle lobe pneumonia Continue vancomycin, cefepime, metronidazole, plan to de-escalate pending clinical course Aggressive pulmonary toilet, incentive spirometer Duonebs, supplemental O2 SLP consulted Aspiration precautions  Severe sepsis, secondary to above, POA Noted tachycardia, leukocytosis, Lactate 3.1 Currently afebrile, with leukocytosis Lactic acidosis resolved BC x 2 pending UA  negative Continue IV fluids 2/2 soft BP, patient appears hypovolemic Continue antibiotics as above  History of chronic systolic HF Last TTE in system at time of her VF arrest admission when EF was 20% Repeat echo showed EF of 55 to 60%, no regional wall motion abnormality, ventricular diastolic parameters normal Continue home digoxin Continue to hold home metoprolol, spironolactone due to sepsis with soft BP  Dysphagia associated with myotonic dystrophy SLP Aspiration precautions  High-grade heart block S/p pacemaker  GERD PPI  Hypothyroidism Continue home levothyroxine  Mood disorder Continue home Seroquel  Chronic pain Continue home tramadol  Malnourished Nutrition consult to assess for tube feeds    Estimated body mass index is 15.61 kg/m as calculated from the following:   Height as of this encounter: 5\' 3"  (1.6 m).   Weight as of this encounter: 40 kg.     Code Status: Full  Family Communication: Cousin/care giver at bedside  Disposition Plan: Status is: Inpatient Remains inpatient appropriate because: level of care      Consultants: None  Procedures: None  Antimicrobials: Vancomycin Cefepime Flagyl  DVT prophylaxis: Lovenox   Objective: Vitals:   05/21/23 0527 05/21/23 0619 05/21/23 0623 05/21/23 1002  BP: 103/74  95/61 (!) 86/56  Pulse: 94  93 75  Resp: 20  20 16   Temp:   97.6 F (36.4 C) 98.3 F (36.8 C)  TempSrc:   Oral Oral  SpO2: 100%  97% 94%  Weight:  40 kg    Height:  5\' 3"  (1.6 m)      Intake/Output Summary (Last 24 hours) at 05/21/2023 1443 Last data filed at 05/21/2023 1300 Gross per 24 hour  Intake 1986.97 ml  Specimen Description BLOOD SITE NOT SPECIFIED  Final   Special Requests AEROBIC BOTTLE ONLY Blood Culture adequate volume  Final   Culture   Final    NO GROWTH < 12 HOURS Performed at [facility], [address] [place], [place] [address]    Report Status PENDING  Incomplete  SARS Coronavirus 2 by RT PCR (hospital order, performed in [facility] hospital lab) *cepheid single result test* Anterior Nasal Swab     Status: None   Collection Time: 05/20/23 11:30 PM   Specimen: Anterior Nasal Swab  Result Value Ref Range Status   SARS Coronavirus 2 by RT PCR NEGATIVE NEGATIVE Final    Comment: Performed at [facility], [address] [place], [place] [address]  MRSA Next Gen by PCR, Nasal     Status: Abnormal   Collection Time: 05/21/23  3:09 AM   Specimen: Nasal Mucosa; Nasal Swab  Result Value Ref Range Status   MRSA by PCR Next Gen DETECTED (A) NOT DETECTED Final    Comment: RESULT CALLED TO, READ BACK  BY AND VERIFIED WITH: Cynthia Underwood AT [id number] 05/21/2023 Cynthia Underwood (NOTE) The GeneXpert MRSA Assay (FDA approved for NASAL specimens only), is one component of a comprehensive MRSA colonization surveillance program. It is not intended to diagnose MRSA infection nor to guide or monitor treatment for MRSA infections. Test performance is not FDA approved in patients less than 32 years old. Performed at [facility], [address], [place], [place] [address]       Studies: ECHOCARDIOGRAM COMPLETE  Result Date: 05/21/2023    ECHOCARDIOGRAM REPORT   Patient Name:   Cynthia Underwood Date of Exam: 05/21/2023 Medical Rec #:  [id number]         Height:       63.0 in Accession #:    [id number]        Weight:       88.1 lb Date of Birth:  1971/07/03        BSA:          1.365 m Patient Age:    51 years          BP:           86/56 mmHg Patient Gender: F                 HR:           71 bpm. Exam Location:  Inpatient Procedure: 2D Echo, Cardiac Doppler and Color Doppler Indications:    Congestive Heart Failure I50.9  History:        Patient has prior history of Echocardiogram examinations, most                 recent 11/15/2022. CHF, Pacemaker, Arrythmias:RBBB; Risk                 Factors:Former Smoker.  Sonographer:    Aron Baba Referring Phys: [id number] [facility]  Sonographer Comments: Suboptimal subcostal window. Image acquisition challenging due to respiratory motion and Image acquisition challenging due to patient body habitus. IMPRESSIONS  1. Left ventricular ejection fraction, by estimation, is 55 to 60%. The left ventricle has normal function. The left ventricle has no regional wall motion abnormalities. Left ventricular diastolic parameters were normal.  2. Right ventricular systolic function is normal. The right ventricular size is normal. There is normal pulmonary artery systolic pressure.  3. A small pericardial effusion is present.  4. The mitral valve  PROGRESS NOTE  Cynthia Underwood KGM:[id number] DOB: 04-29-1971 DOA: 05/20/2023 PCP: Hoy Register, MD  HPI/Recap of past 24 hours: Cynthia Underwood is a 52 y.o. female with hx of myotonic dystrophy, c/b heart block, PPM, pneumonia and aspiration event resulting in VF arrest requiring trach and PEG, remains with PEG tube and trach has been taken down, HFrEF, hypothyroidism, who presented with new cough with green sputum production for the past few days. Reports some dysphagia as well as some coughing while eating. She has been taking in most of her nutrition by mouth and eats "regular food". She has had issues getting her tube-feeds. Pt admitted for further management.    Today, met pt and cousin (who lives with and takes care of her) at bedside. Reports pt doesn't really take aspiration precautions and lays flat shortly after eating. Reported having issues getting the tubefeeds, so pt has been eating regular food mostly be mouth which has been inadequate. They have been waiting to see SLP and a nutritionist. Pt reports some mild tenderness around PEG, noted dry skin around PEG, no erythema, drainage noted. Denies any chest pain, SOB, fever/chills.  Appears very dry    Assessment/Plan: Principal Problem:   Aspiration pneumonia (HCC)   Recurrent aspiration pneumonia, associated myotonic dystrophy Acute on chronic hypoxic respiratory failure (home 2 L at night) Hypoxic into the high 80s requiring 2 L O2 on arrival Currently on 2L of O2, plan to wean off to only nights Chest x-ray with a new right middle lobe pneumonia Continue vancomycin, cefepime, metronidazole, plan to de-escalate pending clinical course Aggressive pulmonary toilet, incentive spirometer Duonebs, supplemental O2 SLP consulted Aspiration precautions  Severe sepsis, secondary to above, POA Noted tachycardia, leukocytosis, Lactate 3.1 Currently afebrile, with leukocytosis Lactic acidosis resolved BC x 2 pending UA  negative Continue IV fluids 2/2 soft BP, patient appears hypovolemic Continue antibiotics as above  History of chronic systolic HF Last TTE in system at time of her VF arrest admission when EF was 20% Repeat echo showed EF of 55 to 60%, no regional wall motion abnormality, ventricular diastolic parameters normal Continue home digoxin Continue to hold home metoprolol, spironolactone due to sepsis with soft BP  Dysphagia associated with myotonic dystrophy SLP Aspiration precautions  High-grade heart block S/p pacemaker  GERD PPI  Hypothyroidism Continue home levothyroxine  Mood disorder Continue home Seroquel  Chronic pain Continue home tramadol  Malnourished Nutrition consult to assess for tube feeds    Estimated body mass index is 15.61 kg/m as calculated from the following:   Height as of this encounter: 5\' 3"  (1.6 m).   Weight as of this encounter: 40 kg.     Code Status: Full  Family Communication: Cousin/care giver at bedside  Disposition Plan: Status is: Inpatient Remains inpatient appropriate because: level of care      Consultants: None  Procedures: None  Antimicrobials: Vancomycin Cefepime Flagyl  DVT prophylaxis: Lovenox   Objective: Vitals:   05/21/23 0527 05/21/23 0619 05/21/23 0623 05/21/23 1002  BP: 103/74  95/61 (!) 86/56  Pulse: 94  93 75  Resp: 20  20 16   Temp:   97.6 F (36.4 C) 98.3 F (36.8 C)  TempSrc:   Oral Oral  SpO2: 100%  97% 94%  Weight:  40 kg    Height:  5\' 3"  (1.6 m)      Intake/Output Summary (Last 24 hours) at 05/21/2023 1443 Last data filed at 05/21/2023 1300 Gross per 24 hour  Intake 1986.97 ml  Specimen Description BLOOD SITE NOT SPECIFIED  Final   Special Requests AEROBIC BOTTLE ONLY Blood Culture adequate volume  Final   Culture   Final    NO GROWTH < 12 HOURS Performed at [facility], [address] [place], [place] [address]    Report Status PENDING  Incomplete  SARS Coronavirus 2 by RT PCR (hospital order, performed in [facility] hospital lab) *cepheid single result test* Anterior Nasal Swab     Status: None   Collection Time: 05/20/23 11:30 PM   Specimen: Anterior Nasal Swab  Result Value Ref Range Status   SARS Coronavirus 2 by RT PCR NEGATIVE NEGATIVE Final    Comment: Performed at [facility], [address] [place], [place] [address]  MRSA Next Gen by PCR, Nasal     Status: Abnormal   Collection Time: 05/21/23  3:09 AM   Specimen: Nasal Mucosa; Nasal Swab  Result Value Ref Range Status   MRSA by PCR Next Gen DETECTED (A) NOT DETECTED Final    Comment: RESULT CALLED TO, READ BACK  BY AND VERIFIED WITH: Cynthia Underwood AT [id number] 05/21/2023 Cynthia Underwood (NOTE) The GeneXpert MRSA Assay (FDA approved for NASAL specimens only), is one component of a comprehensive MRSA colonization surveillance program. It is not intended to diagnose MRSA infection nor to guide or monitor treatment for MRSA infections. Test performance is not FDA approved in patients less than 32 years old. Performed at [facility], [address], [place], [place] [address]       Studies: ECHOCARDIOGRAM COMPLETE  Result Date: 05/21/2023    ECHOCARDIOGRAM REPORT   Patient Name:   Cynthia Underwood Date of Exam: 05/21/2023 Medical Rec #:  [id number]         Height:       63.0 in Accession #:    [id number]        Weight:       88.1 lb Date of Birth:  1971/07/03        BSA:          1.365 m Patient Age:    51 years          BP:           86/56 mmHg Patient Gender: F                 HR:           71 bpm. Exam Location:  Inpatient Procedure: 2D Echo, Cardiac Doppler and Color Doppler Indications:    Congestive Heart Failure I50.9  History:        Patient has prior history of Echocardiogram examinations, most                 recent 11/15/2022. CHF, Pacemaker, Arrythmias:RBBB; Risk                 Factors:Former Smoker.  Sonographer:    Aron Baba Referring Phys: [id number] [facility]  Sonographer Comments: Suboptimal subcostal window. Image acquisition challenging due to respiratory motion and Image acquisition challenging due to patient body habitus. IMPRESSIONS  1. Left ventricular ejection fraction, by estimation, is 55 to 60%. The left ventricle has normal function. The left ventricle has no regional wall motion abnormalities. Left ventricular diastolic parameters were normal.  2. Right ventricular systolic function is normal. The right ventricular size is normal. There is normal pulmonary artery systolic pressure.  3. A small pericardial effusion is present.  4. The mitral valve

## 2023-05-21 NOTE — ED Notes (Signed)
ED TO INPATIENT HANDOFF REPORT  ED Nurse Name and Phone [phone]  S Name/Age/Gender Cynthia Underwood 52 y.o. female Room/Bed: 039C/039C  Code Status   Code Status: Full Code  Home/SNF/Other Home Patient oriented to: self, place, time, and situation Is this baseline? Yes   Triage Complete: Triage complete  Chief Complaint Aspiration pneumonia (HCC) [J69.0]  Triage Note Pt from home with ems, pt c.o weakness, malaise, body aches and productive cough. Pt not eating much lately. Has a feeding tube. Hx of pneumonia earlier this year, required an ICU stay and trach.  89% on room air 94% 2L   Rales noted all over with some wheezing in the upper lungs.  118/70   Allergies Allergies  Allergen Reactions   Penicillin G Nausea And Vomiting, Rash and Other (See Comments)    Sore throat Has had cephalosporins in past   Penicillins Nausea And Vomiting, Rash and Other (See Comments)    Fever Has patient had a PCN reaction causing immediate rash, facial/tongue/throat swelling, SOB or lightheadedness with hypotension: Yes Has patient had a PCN reaction causing severe rash involving mucus membranes or skin necrosis: Unknown Has patient had a PCN reaction that required hospitalization: No Has patient had a PCN reaction occurring within the last 10 years: No If all of the above answers are "NO", then may proceed with Cephalosporin use.     Level of Care/Admitting Diagnosis ED Disposition     ED Disposition  Admit   Condition  --   Comment  Hospital Area: [facility] [[id number]]  Level of Care: Telemetry Medical [[id number]]  May admit patient to [facility] or [facility] if equivalent level of care is available:: Yes  Covid Evaluation: Asymptomatic - no recent exposure (last 10 days) testing not required  Diagnosis: Aspiration pneumonia [facility]) [[id number]]  Admitting Physician: Dolly Rias [[id number]]  Attending Physician: Dolly Rias [[id number]]  Certification:: I  certify this patient will need inpatient services for at least 2 midnights  Expected Medical Readiness: 05/24/2023          B Medical/Surgery History Past Medical History:  Diagnosis Date   Bifascicular block 10/02/2014   Cataract    Dyspnea    Excess ear wax    Multinodular goiter    Pneumonia    Presence of permanent cardiac pacemaker    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   Sickle cell trait (HCC)    Watery eyes    Left   Weakness of both legs    Past Surgical History:  Procedure Laterality Date   BREAST BIOPSY Right    EYE SURGERY Left 2017   "related to blockage in my nose"   EYE SURGERY Right 05/2019   INSERT / REPLACE / REMOVE PACEMAKER  02/15/2017   IR GASTROSTOMY TUBE MOD SED  01/01/2023   PACEMAKER IMPLANT N/A 02/15/2017   Procedure: Pacemaker Implant;  Surgeon: Duke Salvia, MD;  Location: [facility] INVASIVE CV LAB;  Service: Cardiovascular;  Laterality: N/A;   RIGHT/LEFT HEART CATH AND CORONARY ANGIOGRAPHY N/A 11/16/2022   Procedure: RIGHT/LEFT HEART CATH AND CORONARY ANGIOGRAPHY;  Surgeon: Laurey Morale, MD;  Location: [facility] INVASIVE CV LAB;  Service: Cardiovascular;  Laterality: N/A;   THYROIDECTOMY N/A 04/21/2021   Procedure: TOTAL THYROIDECTOMY;  Surgeon: Axel Filler, MD;  Location: [facility] OR;  Service: General;  Laterality: N/A;     A IV Location/Drains/Wounds Patient Lines/Drains/Airways Status     Active Line/Drains/Airways  Name Placement date Placement time Site Days   Peripheral IV 05/20/23 20 G Left;Distal Forearm 05/20/23  2252  Forearm  1   Peripheral IV 05/20/23 22 G Right Wrist 05/20/23  2252  Wrist  1   Gastrostomy/Enterostomy Percutaneous endoscopic gastrostomy (PEG) 20 Fr. LUQ 01/01/23  0947  LUQ  140            Intake/Output Last 24 hours  Intake/Output Summary (Last 24 hours) at 05/21/2023 0528 Last data filed at 05/21/2023 0345 Gross per 24 hour  Intake 1000 ml  Output --  Net 1000  ml    Labs/Imaging Results for orders placed or performed during the hospital encounter of 05/20/23 (from the past 48 hour(s))  Lipase, blood     Status: None   Collection Time: 05/20/23 10:55 PM  Result Value Ref Range   Lipase 21 11 - 51 U/L    Comment: Performed at [facility], [address], [place], [place] [address]  Brain natriuretic peptide     Status: None   Collection Time: 05/20/23 10:55 PM  Result Value Ref Range   B Natriuretic Peptide 59.8 0.0 - 100.0 pg/mL    Comment: Performed at [facility], [address], [place], [place] [address]  Troponin I (High Sensitivity)     Status: None   Collection Time: 05/20/23 10:55 PM  Result Value Ref Range   Troponin I (High Sensitivity) 14 <18 ng/L    Comment: (NOTE) Elevated high sensitivity troponin I (hsTnI) values and significant  changes across serial measurements may suggest ACS but many other  chronic and acute conditions are known to elevate hsTnI results.  Refer to the "Links" section for chest pain algorithms and additional  guidance. Performed at [facility], [address], [place], [place] [address]   CBC with Differential     Status: Abnormal   Collection Time: 05/20/23 10:56 PM  Result Value Ref Range   WBC 15.5 (H) 4.0 - 10.5 K/uL   RBC 4.57 3.87 - 5.11 MIL/uL   Hemoglobin 13.5 12.0 - 15.0 g/dL   HCT 21.3 08.6 - 57.8 %   MCV 96.1 80.0 - 100.0 fL   MCH 29.5 26.0 - 34.0 pg   MCHC 30.8 30.0 - 36.0 g/dL   RDW 46.9 62.9 - 52.8 %   Platelets 309 150 - 400 K/uL   nRBC 0.0 0.0 - 0.2 %   Neutrophils Relative % 84 %   Neutro Abs 12.9 (H) 1.7 - 7.7 K/uL   Lymphocytes Relative 12 %   Lymphs Abs 1.8 0.7 - 4.0 K/uL   Monocytes Relative 4 %   Monocytes Absolute 0.6 0.1 - 1.0 K/uL   Eosinophils Relative 0 %   Eosinophils Absolute 0.1 0.0 - 0.5 K/uL   Basophils Relative 0 %   Basophils Absolute 0.0 0.0 - 0.1 K/uL   Immature Granulocytes 0 %   Abs Immature Granulocytes 0.06 0.00 - 0.07 K/uL     Comment: Performed at [facility], [address] [place], [place] [address]  Comprehensive metabolic panel     Status: Abnormal   Collection Time: 05/20/23 10:56 PM  Result Value Ref Range   Sodium 138 135 - 145 mmol/L   Potassium 3.6 3.5 - 5.1 mmol/L   Chloride 97 (L) 98 - 111 mmol/L   CO2 28 22 - 32 mmol/L   Glucose, Bld 105 (H) 70 - 99 mg/dL    Comment: Glucose reference range  applies only to samples taken after fasting for at least 8 hours.   BUN 7 6 - 20 mg/dL   Creatinine, Ser 7.82 0.44 - 1.00 mg/dL   Calcium 9.4 8.9 - 95.6 mg/dL   Total Protein 8.7 (H) 6.5 - 8.1 g/dL   Albumin 3.0 (L) 3.5 - 5.0 g/dL   AST 23 15 - 41 U/L   ALT 13 0 - 44 U/L   Alkaline Phosphatase 73 38 - 126 U/L   Total Bilirubin 0.7 0.3 - 1.2 mg/dL   GFR, Estimated >21 >30 mL/min    Comment: (NOTE) Calculated using the CKD-EPI Creatinine Equation (2021)    Anion gap 13 5 - 15    Comment: Performed at [facility], [address], [place], [place] [address]  Protime-INR     Status: Abnormal   Collection Time: 05/20/23 10:56 PM  Result Value Ref Range   Prothrombin Time 15.9 (H) 11.4 - 15.2 seconds   INR 1.3 (H) 0.8 - 1.2    Comment: (NOTE) INR goal varies based on device and disease states. Performed at [facility], [address], [place], [place] [address]   Lactic acid, plasma     Status: Abnormal   Collection Time: 05/20/23 10:56 PM  Result Value Ref Range   Lactic Acid, Venous 3.1 (HH) 0.5 - 1.9 mmol/L    Comment: CRITICAL RESULT CALLED TO, READ BACK BY AND VERIFIED WITH Renetta Chalk RN @ [phone] 05/20/23 JBUTLER Performed at [facility], [address], [place], [place] [address]   Urinalysis, Routine w reflex microscopic -Urine, Clean Catch     Status: None   Collection Time: 05/20/23 11:30 PM  Result Value Ref Range   Color, Urine YELLOW YELLOW   APPearance CLEAR CLEAR   Specific Gravity, Urine 1.015 1.005 - 1.030   pH 7.0 5.0 - 8.0   Glucose, UA NEGATIVE  NEGATIVE mg/dL   Hgb urine dipstick NEGATIVE NEGATIVE   Bilirubin Urine NEGATIVE NEGATIVE   Ketones, ur NEGATIVE NEGATIVE mg/dL   Protein, ur NEGATIVE NEGATIVE mg/dL   Nitrite NEGATIVE NEGATIVE   Leukocytes,Ua NEGATIVE NEGATIVE    Comment: Microscopic not done on urines with negative protein, blood, leukocytes, nitrite, or glucose < 500 mg/dL. Performed at [facility], [address] [place], [place] [address]   SARS Coronavirus 2 by RT PCR (hospital order, performed in [facility] hospital lab) *cepheid single result test* Anterior Nasal Swab     Status: None   Collection Time: 05/20/23 11:30 PM   Specimen: Anterior Nasal Swab  Result Value Ref Range   SARS Coronavirus 2 by RT PCR NEGATIVE NEGATIVE    Comment: Performed at [facility], [address] [place], [place] [address]  Troponin I (High Sensitivity)     Status: None   Collection Time: 05/21/23 12:58 AM  Result Value Ref Range   Troponin I (High Sensitivity) 12 <18 ng/L    Comment: (NOTE) Elevated high sensitivity troponin I (hsTnI) values and significant  changes across serial measurements may suggest ACS but many other  chronic and acute conditions are known to elevate hsTnI results.  Refer to the "Links" section for chest pain algorithms and additional  guidance. Performed at [facility], [address] [place], [place] [address]   Lactic acid, plasma     Status: Abnormal   Collection Time: 05/21/23  1:49 AM  Result Value Ref Range   Lactic Acid, Venous 2.0 (  HH) 0.5 - 1.9 mmol/L    Comment: CRITICAL VALUE NOTED. VALUE IS CONSISTENT WITH PREVIOUSLY REPORTED/CALLED VALUE Performed at [facility], [address] [place], [place] [address]   Basic metabolic panel     Status: Abnormal   Collection Time: 05/21/23  1:49 AM  Result Value Ref Range   Sodium 140 135 - 145 mmol/L   Potassium 4.1 3.5 - 5.1 mmol/L   Chloride 100 98 - 111 mmol/L   CO2 30 22 - 32 mmol/L   Glucose, Bld 132 (H) 70  - 99 mg/dL    Comment: Glucose reference range applies only to samples taken after fasting for at least 8 hours.   BUN 5 (L) 6 - 20 mg/dL   Creatinine, Ser 0.98 0.44 - 1.00 mg/dL   Calcium 8.7 (L) 8.9 - 10.3 mg/dL   GFR, Estimated >11 >91 mL/min    Comment: (NOTE) Calculated using the CKD-EPI Creatinine Equation (2021)    Anion gap 10 5 - 15    Comment: Performed at [facility], [address] [place], [place] [address]  CBC     Status: Abnormal   Collection Time: 05/21/23  1:49 AM  Result Value Ref Range   WBC 22.8 (H) 4.0 - 10.5 K/uL   RBC 3.56 (L) 3.87 - 5.11 MIL/uL   Hemoglobin 10.6 (L) 12.0 - 15.0 g/dL   HCT 56.2 (L) 13.0 - 86.5 %   MCV 95.2 80.0 - 100.0 fL   MCH 29.8 26.0 - 34.0 pg   MCHC 31.3 30.0 - 36.0 g/dL   RDW 78.4 69.6 - 29.5 %   Platelets 339 150 - 400 K/uL   nRBC 0.0 0.0 - 0.2 %    Comment: Performed at [facility], [address], [place], [place] [address]  Magnesium     Status: None   Collection Time: 05/21/23  1:49 AM  Result Value Ref Range   Magnesium 1.8 1.7 - 2.4 mg/dL    Comment: Performed at [facility], [address] [place], [place] [address]  Phosphorus     Status: Abnormal   Collection Time: 05/21/23  1:49 AM  Result Value Ref Range   Phosphorus 1.9 (L) 2.5 - 4.6 mg/dL    Comment: Performed at [facility], [address] [place], [place] [address]   DG Chest 2 View  Result Date: 05/20/2023 CLINICAL DATA:  Shortness of breath EXAM: CHEST - 2 VIEW COMPARISON:  01/27/2023 FINDINGS: Cardiac shadow is mildly enlarged. Pacing device is again seen and stable. Lungs are well aerated bilaterally. Previously seen lower lobe collapse has predominantly resolved. Residual opacity in the right lower lobe is noted. There is a linear area of airspace opacity in the right mid lung new from the prior exam. This likely represents acute infiltrate. Postsurgical changes at the thoracic inlet are noted. No bony abnormality is seen.  IMPRESSION: Near complete resolution of previously seen bibasilar consolidation. Only mild residual opacity in the right lower lobe is noted. New right mid lung opacity consistent with acute infiltrate. Electronically Signed   By: Alcide Clever M.D.   On: 05/20/2023 23:19    Pending Labs Unresulted Labs (From admission, onward)     Start     Ordered   05/21/23 0530  Lactic acid, plasma  (Lactic Acid)  ONCE - STAT,   STAT        05/21/23 0403   05/21/23 0415  Culture, Respiratory w Gram  Stain  Once,   R        05/21/23 0414   05/21/23 0415  MRSA culture  Once,   R        05/21/23 0414   05/21/23 0206  MRSA Next Gen by PCR, Nasal  (MRSA Screening)  Once,   R        05/21/23 0205   05/20/23 2251  Blood culture (routine x 2)  BLOOD CULTURE X 2,   R      05/20/23 2251            Vitals/Pain Today's Vitals   05/21/23 0130 05/21/23 0222 05/21/23 0400 05/21/23 0527  BP: 111/72  109/76 103/74  Pulse: (!) 108  (!) 113 94  Resp: 19  (!) 22 20  Temp:  99.6 F (37.6 C) 99.6 F (37.6 C)   TempSrc:  Oral Oral   SpO2: 100%  100% 100%  PainSc: 0-No pain  0-No pain 0-No pain    Isolation Precautions Airborne and Contact precautions  Medications Medications  ceFEPIme (MAXIPIME) 2 g in sodium chloride 0.9 % 100 mL IVPB (has no administration in time range)  metroNIDAZOLE (FLAGYL) tablet 500 mg (500 mg Per Tube Given 05/21/23 0231)  enoxaparin (LOVENOX) injection 40 mg (has no administration in time range)  sodium chloride flush (NS) 0.9 % injection 3 mL (3 mLs Intravenous Not Given 05/21/23 0244)  potassium chloride 10 mEq in 100 mL IVPB (10 mEq Intravenous New Bag/Given 05/21/23 0507)  acetaminophen (TYLENOL) tablet 1,000 mg (has no administration in time range)  QUEtiapine (SEROQUEL) tablet 25 mg (25 mg Per Tube Given 05/21/23 0231)  levothyroxine (SYNTHROID) tablet 112 mcg (has no administration in time range)  vancomycin (VANCOREADY) IVPB 750 mg/150 mL (has no administration in time  range)  sodium phosphate 15 mmol in dextrose 5 % 250 mL infusion (has no administration in time range)  albuterol (PROVENTIL) (2.5 MG/3ML) 0.083% nebulizer solution 2.5 mg (2.5 mg Nebulization Given 05/21/23 0520)  digoxin (LANOXIN) tablet 250 mcg (has no administration in time range)  lactated ringers bolus 500 mL (0 mLs Intravenous Stopped 05/20/23 2355)  ceFEPIme (MAXIPIME) 2 g in sodium chloride 0.9 % 100 mL IVPB (0 g Intravenous Stopped 05/20/23 2355)  vancomycin (VANCOCIN) IVPB 1000 mg/200 mL premix (0 mg Intravenous Stopped 05/21/23 0033)  lactated ringers bolus 500 mL (0 mLs Intravenous Stopped 05/21/23 0434)  lactated ringers bolus 500 mL (500 mLs Intravenous New Bag/Given 05/21/23 0455)    Mobility walks with person assist     Focused Assessments   R Recommendations: See Admitting Provider Note  Report given to:   Additional Notes:

## 2023-05-21 NOTE — Progress Notes (Signed)
Pharmacy Antibiotic Note  Cynthia Underwood is a 52 y.o. female admitted on 05/20/2023 with weakness, malaise, productive cough and concerns for pneumonia. PMH includes pneumonia earlier in year requiring ICU stay and trach. Pharmacy has been consulted for vancomycin dosing.   WBC 15.5, sCr 0.51 (bl~), lactate 3.1, afebrile, blood cultures ordered, last MRSA PCR negative (11/2022)  Plan: -Cefepime 2g IV every 8 hours per MD -Vancomycin 1000mg  IV x1 -Vancomycin 750 mg IV every 24 hours (AUC 430, Vd 0.72, IBW) -Order MRSA PCR -Monitor renal function -Follow up signs of clinical improvement, LOT, de-escalation of antibiotics, cultures     Temp (24hrs), Avg:98.6 F (37 C), Min:98.6 F (37 C), Max:98.6 F (37 C)  Recent Labs  Lab 05/20/23 2256  WBC 15.5*  CREATININE 0.51  LATICACIDVEN 3.1*    Estimated Creatinine Clearance: 51.6 mL/min (by C-G formula based on SCr of 0.51 mg/dL).    Allergies  Allergen Reactions   Penicillin G Nausea And Vomiting, Rash and Other (See Comments)    Sore throat Has had cephalosporins in past   Penicillins Nausea And Vomiting, Rash and Other (See Comments)    Fever Has patient had a PCN reaction causing immediate rash, facial/tongue/throat swelling, SOB or lightheadedness with hypotension: Yes Has patient had a PCN reaction causing severe rash involving mucus membranes or skin necrosis: Unknown Has patient had a PCN reaction that required hospitalization: No Has patient had a PCN reaction occurring within the last 10 years: No If all of the above answers are "NO", then may proceed with Cephalosporin use.     Antimicrobials this admission: Cefepime 9/15 >>  Vancomycin 9/15 >>   Microbiology results: 9/15 BCx:  9/16 MRSA PCR:   Thank you for allowing pharmacy to be a part of this patient's care.  Arabella Merles, PharmD. Clinical Pharmacist 05/21/2023 2:04 AM

## 2023-05-21 NOTE — Telephone Encounter (Signed)
NOTED

## 2023-05-22 ENCOUNTER — Inpatient Hospital Stay (HOSPITAL_COMMUNITY): Payer: 59

## 2023-05-22 DIAGNOSIS — J690 Pneumonitis due to inhalation of food and vomit: Secondary | ICD-10-CM | POA: Diagnosis not present

## 2023-05-22 LAB — CBC WITH DIFFERENTIAL/PLATELET
Abs Immature Granulocytes: 0.03 10*3/uL (ref 0.00–0.07)
Basophils Absolute: 0 10*3/uL (ref 0.0–0.1)
Basophils Relative: 1 %
Eosinophils Absolute: 0.1 10*3/uL (ref 0.0–0.5)
Eosinophils Relative: 2 %
HCT: 29.9 % — ABNORMAL LOW (ref 36.0–46.0)
Hemoglobin: 9.3 g/dL — ABNORMAL LOW (ref 12.0–15.0)
Immature Granulocytes: 1 %
Lymphocytes Relative: 31 %
Lymphs Abs: 1.9 10*3/uL (ref 0.7–4.0)
MCH: 29.9 pg (ref 26.0–34.0)
MCHC: 31.1 g/dL (ref 30.0–36.0)
MCV: 96.1 fL (ref 80.0–100.0)
Monocytes Absolute: 0.5 10*3/uL (ref 0.1–1.0)
Monocytes Relative: 8 %
Neutro Abs: 3.6 10*3/uL (ref 1.7–7.7)
Neutrophils Relative %: 57 %
Platelets: 259 10*3/uL (ref 150–400)
RBC: 3.11 MIL/uL — ABNORMAL LOW (ref 3.87–5.11)
RDW: 14.3 % (ref 11.5–15.5)
WBC: 6.2 10*3/uL (ref 4.0–10.5)
nRBC: 0 % (ref 0.0–0.2)

## 2023-05-22 LAB — T3

## 2023-05-22 LAB — T4, FREE

## 2023-05-22 LAB — GLUCOSE, CAPILLARY
Glucose-Capillary: 58 mg/dL — ABNORMAL LOW (ref 70–99)
Glucose-Capillary: 130 mg/dL — ABNORMAL HIGH (ref 70–99)
Glucose-Capillary: 91 mg/dL (ref 70–99)
Glucose-Capillary: 71 mg/dL (ref 70–99)
Glucose-Capillary: 92 mg/dL (ref 70–99)

## 2023-05-22 LAB — BASIC METABOLIC PANEL
Anion gap: 11 (ref 5–15)
BUN: <5 mg/dL — ABNORMAL LOW (ref 6–20)
CO2: 24 mmol/L (ref 22–32)
Calcium: 8.3 mg/dL — ABNORMAL LOW (ref 8.9–10.3)
Chloride: 106 mmol/L (ref 98–111)
Creatinine, Ser: 0.5 mg/dL (ref 0.44–1.00)
GFR, Estimated: >60 mL/min (ref >60)
Glucose, Bld: 64 mg/dL — ABNORMAL LOW (ref 70–99)
Potassium: 3.8 mmol/L (ref 3.5–5.1)
Sodium: 141 mmol/L (ref 135–145)

## 2023-05-22 LAB — VITAMIN D 25 HYDROXY (VIT D DEFICIENCY, FRACTURES)

## 2023-05-22 LAB — PHOSPHORUS: Phosphorus: 2.7 mg/dL (ref 2.5–4.6)

## 2023-05-22 LAB — POTASSIUM

## 2023-05-22 LAB — TSH

## 2023-05-22 MED ORDER — SODIUM CHLORIDE 0.9 % IV SOLN
2.0000 g | INTRAVENOUS | Status: DC
  Administered 2023-05-22 – 2023-05-24 (×3): 2 g via INTRAVENOUS
  Filled 2023-05-22 (×3): qty 20

## 2023-05-22 MED ORDER — FREE WATER
120.0000 mL | Status: DC
  Administered 2023-05-22 – 2023-05-25 (×18): 120 mL

## 2023-05-22 MED ORDER — JEVITY 1.5 CAL/FIBER PO LIQD
1000.0000 mL | ORAL | Status: DC
  Administered 2023-05-22: 1000 mL
  Filled 2023-05-22 (×3): qty 1000

## 2023-05-22 MED ORDER — DEXTROSE 50 % IV SOLN
12.5000 g | Freq: Once | INTRAVENOUS | Status: AC | PRN
  Administered 2023-05-22: 12.5 g via INTRAVENOUS
  Filled 2023-05-22: qty 50

## 2023-05-22 MED ORDER — DEXTROSE-SODIUM CHLORIDE 5-0.45 % IV SOLN: INTRAVENOUS | Status: AC

## 2023-05-22 MED ORDER — DEXTROSE-SODIUM CHLORIDE 5-0.45 % IV SOLN: INTRAVENOUS | Status: DC

## 2023-05-22 NOTE — Progress Notes (Signed)
Speech Language Pathology Treatment: Dysphagia  Patient Details Name: Cynthia Underwood MRN: [id number] DOB: Feb 11, 1971 Today's Date: 05/22/2023 Time: 9381-8299 SLP Time Calculation (min) (ACUTE ONLY): 11 min  Assessment / Plan / Recommendation Clinical Impression  Pt awake and alert, reporting she slept better overnight. She self-fed trials of thin liquids via straw with no s/s of aspiration. Provided education regarding aspiration precautions and sitting upright following meals to allow time for complete pharyngeal clearance. Pt reports she typically has difficulty remaining upright after eating and drinking due to having a bed without a headboard, making sitting upright for long periods of time difficult due to lateral leaning. She states that she eats very small portions of soft solid food at home, which she has been doing for only ~1 month. Recommend completing repeat MBS to better assess oropharyngeal function and make most appropriate diet recommendations.    HPI HPI: Cynthia Underwood is a 52 yo female presenting to ED 9/16 with generalized pain and new onset of productive cough with green sputum. PEG in place since April 2024, although per note, pt states that she attains most of her nutrition by mouth and eats "regular food". CXR with new R mid lung opacity consistent with acute infiltrate. MBS 01/25/23 with severe dysphagia characterized by minimal movement of POs through the pharynx, requiring multiple subswallows and resulting in almost total stasis of purees which required oral suctioning to clear. At that time, airway protection was surprisingly effective with no aspiration noted. SLP recommended NPO with small sips of water/ice chips per pt's preference. PMH includes myotonic dystrophy, c/b heart block, PPM, PNA and aspiration event resulting in VF arrest and requiring trach and PEG, HFrEF, hypothyroidism, COPD requiring supplemental O2 at night      SLP Plan  MBS       Recommendations for follow up therapy are one component of a multi-disciplinary discharge planning process, led by the attending physician.  Recommendations may be updated based on patient status, additional functional criteria and insurance authorization.    Recommendations  Diet recommendations: Thin liquid Liquids provided via: Cup;Straw Medication Administration: Via alternative means Supervision: Patient able to self feed Compensations: Minimize environmental distractions;Slow rate;Small sips/bites Postural Changes and/or Swallow Maneuvers: Seated upright 90 degrees;Upright 30-60 min after meal                  Oral care QID   Frequent or constant Supervision/Assistance Dysphagia, unspecified (R13.10)     MBS     Gwynneth Aliment, M.A., CF-SLP Speech Language Pathology, Acute Rehabilitation Services  Secure Chat preferred [phone]   05/22/2023, 10:05 AM

## 2023-05-22 NOTE — Progress Notes (Signed)
Initial Nutrition Assessment  DOCUMENTATION CODES:   Severe malnutrition in context of chronic illness  INTERVENTION:  Initiate EN feeding; Continuous feeding Jevity 1.54ml/hr with FWF every 4 hrs. Provides; 1440 kcal, 61g pro, FW  NUTRITION DIAGNOSIS:   Severe Malnutrition related to chronic illness as evidenced by severe fat depletion, severe muscle depletion, energy intake < or equal to 50% for > or equal to 5 days.    GOAL:   Patient will meet greater than or equal to 90% of their needs    MONITOR:   TF tolerance, Diet advancement, PO intake  REASON FOR ASSESSMENT:   Consult Enteral/tube feeding initiation and management  ASSESSMENT:    52 y.o. F, admitted for recurrent aspiration pneumonia, acute on chronic hypoxic respiratory failure, sepsis secondary.  Pmhx; myotonic dystrophy, c/b heart block, pneumonia and aspiration., sickle cell trait, weakness both legs. Pt has PEG placed, although taking most nutrition PO of  regular diet. Pt alert at time of visit. Stated that she has not been receiving her nutritional support for ~ 3 weeks due to it has not been sent to her. Primary nutrition received PO. Standard meals breakfast bowel with eggs, potatoes and bacon, 75% consumed, grazes till dinner, which is a meat of some type, veg and starch. Favorite snack potato chips. Home NS Jevity 1.5 1 carton QID. Discussed EN will be continuous feeding while in facility will provide home bolus orders. Pt did not know what her UBW was. Stated that she has been in ad out of hospital for months.  SLP reached out to and stated that pt will not be getting her diet advanced at this time.    Meds; Lovenox, Seroquel, vancomycin, Tramadol (PRN) Labs; CBG 64, Ca 8.3  NUTRITION - FOCUSED PHYSICAL EXAM:  Flowsheet Row Most Recent Value  Orbital Region Moderate depletion  Upper Arm Region Severe depletion  Thoracic and Lumbar Region Severe depletion  Buccal Region Moderate  depletion  Temple Region Moderate depletion  Clavicle Bone Region Severe depletion  Clavicle and Acromion Bone Region Severe depletion  Dorsal Hand Moderate depletion  Patellar Region Moderate depletion  Anterior Thigh Region Moderate depletion  Posterior Calf Region Moderate depletion  Edema (RD Assessment) None  Hair Reviewed  Eyes Reviewed  Mouth Reviewed  Skin Reviewed  Nails Reviewed       Diet Order:   Diet Order             Diet NPO time specified Except for: Other (See Comments)  Diet effective now                   EDUCATION NEEDS:   Education needs have been addressed  Skin:  Skin Assessment: Reviewed RN Assessment  Last BM:  9/15  Height:   Ht Readings from Last 1 Encounters:  05/21/23 5\' 3"  (1.6 m)    Weight:   Wt Readings from Last 1 Encounters:  05/22/23 43.1 kg    Ideal Body Weight:  52 kg  BMI:  Body mass index is 16.83 kg/m.  Estimated Nutritional Needs:   Kcal:  1255-1631 Kcal (MSJ x 1.25 x 1-1.3)  Protein:  43-65g (1-1.5g/kg)  Fluid:  1500-1672ml    Ricardo Jericho, RDN, LDN

## 2023-05-22 NOTE — Progress Notes (Addendum)
Modified Barium Swallow Study  Patient Details  Name: Cynthia Underwood MRN: [id number] Date of Birth: 1970-09-10  Today's Date: 05/22/2023  Modified Barium Swallow completed.  Full report located under Chart Review in the Imaging Section.  History of Present Illness Cynthia Underwood is a 52 yo female presenting to ED 9/16 with generalized pain and new onset of productive cough with green sputum. PEG in place since April 2024, although per note, pt states that she attains most of her nutrition by mouth and eats "regular food". CXR with new R mid lung opacity consistent with acute infiltrate. MBS 01/25/23 with severe dysphagia characterized by minimal movement of POs through the pharynx, requiring multiple subswallows and resulting in almost total stasis of purees which required oral suctioning to clear. At that time, airway protection was surprisingly effective with no aspiration noted. SLP recommended NPO with small sips of water/ice chips per pt's preference. PMH includes myotonic dystrophy, c/b heart block, PPM, PNA and aspiration event resulting in VF arrest and requiring trach and PEG, HFrEF, total thyroidectomy, hypothyroidism, COPD requiring supplemental O2 at night   Clinical Impression Pt presents with a severe pharyngeal dysphagia characterized primarily by reduced strength, resulting in almonst complete retention of all consistencies administered. Pt achieves little to no base of tongue retraction resulting in incomplete oral clearance and residue present along the BOT and valleculae. Pt has minimal UES opening, resulting in almost complete retention of barium consistencies in the pharynx. She also has diffuse residue present throughout the pharynx which is only intermittently able to be cleared with the use of cued effortful swallow and multiple swallows. She is unable to achieve epiglottic inversion allowing airway invasion. Pt aspirated trials of thin liquids, although demonstrated a  strong cough response (PAS 7). She was intermittently able to clear aspirates from the trachea. A chin tuck was effective in preventing aspiration, although ineffective at reducing amount of residue which is suspected to contribute to aspiration when the laryngeal vestibule remains open at rest. Nectar thick liquids and purees did not result in any further penetration or aspiration, although note almost total stasis of these thicker consistencies. Pt initiating use of multiple swallows independently, although noted to be minimally effective as she lacks the strength to successfully clear pharyngeal residue. Additionally, an effortful swallow was ineffective as baseline swallowing appears to require moderate effort already. Esophageal retention noted during fluoro sweep as well. Recommend continuing NPO with TF as primary source of nutrition. Allow adherence to the Free Water Protocol only after thorough oral care. Will continue to follow acutely to reinforce aspiration precautions. She will continue to require intensive SLP intervention following d/c.   Factors that may increase risk of adverse event in presence of aspiration Cynthia Underwood & Clearance Cynthia Underwood 2021): Limited mobility  Swallow Evaluation Recommendations Recommendations: NPO;Alternative means of nutrition - G Tube;Free water protocol after oral care Medication Administration: Via alternative means      Cynthia Underwood, M.A., CF-SLP Speech Language Pathology, Acute Rehabilitation Services  Secure Chat preferred [phone]  05/22/2023,1:57 PM

## 2023-05-22 NOTE — Progress Notes (Signed)
PROGRESS NOTE  Cynthia Underwood YNW:[id number] DOB: [birth year] DOA: 05/20/2023 PCP: Hoy Register, MD  HPI/Recap of past 24 hours: Cynthia Underwood is a 52 y.o. female with hx of myotonic dystrophy, c/b heart block, PPM, pneumonia and aspiration event resulting in VF arrest requiring trach and PEG, remains with PEG tube and trach has been taken down, HFrEF, hypothyroidism, who presented with new cough with green sputum production for the past few days. Reports some dysphagia as well as some coughing while eating. She has been taking in most of her nutrition by mouth and eats "regular food". She has had issues getting her tube-feeds. Pt admitted for further management.    Today, patient denies any new complaints.    Assessment/Plan: Principal Problem:   Aspiration pneumonia (HCC)   Recurrent aspiration pneumonia, associated myotonic dystrophy Acute on chronic hypoxic respiratory failure (home 2 L at night) Hypoxic into the high 80s requiring 2 L O2 on arrival Currently on 2L of O2, plan to wean off to only nights Chest x-ray with a new right middle lobe pneumonia Switch to ceftriaxone + flagyl Aggressive pulmonary toilet, incentive spirometer Duonebs, supplemental O2 SLP consulted- MDS done, rec NPO for now Aspiration precautions  Severe sepsis, secondary to above, POA Noted tachycardia, leukocytosis, Lactate 3.1 on admission  Currently afebrile, with resolved leukocytosis Lactic acidosis resolved BC x 2 NGTD UA negative S/p IV fluids Continue antibiotics as above  History of chronic systolic HF- resolved Last TTE in system at time of her VF arrest admission when EF was 20% Repeat echo showed EF of 55 to 60%, no regional wall motion abnormality, ventricular diastolic parameters normal Continue home digoxin Continue to hold home metoprolol, spironolactone due to sepsis with soft BP  Dysphagia associated with myotonic dystrophy SLP- MDS done, rec NPO for now Started  tube feeds via PEG with water flushes Aspiration precautions  High-grade heart block S/p pacemaker  GERD PPI  Hypothyroidism Continue home levothyroxine  Mood disorder Continue home Seroquel  Chronic pain Continue home tramadol  Severe Malnutrition  Nutrition consult     Estimated body mass index is 16.83 kg/m as calculated from the following:   Height as of this encounter: 5\' 3"  (1.6 m).   Weight as of this encounter: 43.1 kg.     Code Status: Full  Family Communication: None at bedside  Disposition Plan: Status is: Inpatient Remains inpatient appropriate because: level of care      Consultants: None  Procedures: None  Antimicrobials: Ceftriaxone Flagyl  DVT prophylaxis: Lovenox   Objective: Vitals:   05/22/23 0300 05/22/23 0436 05/22/23 0437 05/22/23 0904  BP:   (!) 105/57 107/60  Pulse:   83 70  Resp: 20  20 16   Temp:   98.1 F (36.7 C) 97.6 F (36.4 C)  TempSrc:    Oral  SpO2:   96% 98%  Weight:  43.1 kg    Height:        Intake/Output Summary (Last 24 hours) at 05/22/2023 1556 Last data filed at 05/22/2023 0606 Gross per 24 hour  Intake 1349.99 ml  Output 100 ml  Net 1249.99 ml   Filed Weights   05/21/23 0619 05/22/23 0436  Weight: 40 kg 43.1 kg    Exam: General: NAD, thin, cachectic Cardiovascular: S1, S2 present Respiratory: Diminished breath sounds bilaterally Abdomen: Soft, nontender, nondistended, bowel sounds present Musculoskeletal: No bilateral pedal edema noted Skin: Normal Psychiatry: Normal mood     Data Reviewed: CBC: Recent Labs  Lab 05/20/23  2256 05/21/23 0149 05/22/23 0556  WBC 15.5* 22.8* 6.2  NEUTROABS 12.9*  --  3.6  HGB 13.5 10.6* 9.3*  HCT 43.9 33.9* 29.9*  MCV 96.1 95.2 96.1  PLT 309 339 259   Basic Metabolic Panel: Recent Labs  Lab 05/16/23 1452 05/20/23 2256 05/21/23 0149 05/22/23 0556  NA  --  138 140 141  K CANCELED 3.6 4.1 3.8  CL  --  97* 100 106  CO2  --  28 30 24    GLUCOSE  --  105* 132* 64*  BUN  --  7 5* <5*  CREATININE  --  0.51 0.51 0.50  CALCIUM  --  9.4 8.7* 8.3*  MG  --   --  1.8  --   PHOS  --   --  1.9* 2.7   GFR: Estimated Creatinine Clearance: 55.3 mL/min (by C-G formula based on SCr of 0.5 mg/dL). Liver Function Tests: Recent Labs  Lab 05/20/23 2256  AST 23  ALT 13  ALKPHOS 73  BILITOT 0.7  PROT 8.7*  ALBUMIN 3.0*   Recent Labs  Lab 05/20/23 2255  LIPASE 21   No results for input(s): "AMMONIA" in the last 168 hours. Coagulation Profile: Recent Labs  Lab 05/20/23 2256  INR 1.3*   Cardiac Enzymes: No results for input(s): "CKTOTAL", "CKMB", "CKMBINDEX", "TROPONINI" in the last 168 hours. BNP (last 3 results) No results for input(s): "PROBNP" in the last 8760 hours. HbA1C: No results for input(s): "HGBA1C" in the last 72 hours. CBG: Recent Labs  Lab 05/22/23 0822 05/22/23 0858 05/22/23 1148  GLUCAP 58* 130* 91   Lipid Profile: No results for input(s): "CHOL", "HDL", "LDLCALC", "TRIG", "CHOLHDL", "LDLDIRECT" in the last 72 hours. Thyroid Function Tests: No results for input(s): "TSH", "T4TOTAL", "FREET4", "T3FREE", "THYROIDAB" in the last 72 hours. Anemia Panel: No results for input(s): "VITAMINB12", "FOLATE", "FERRITIN", "TIBC", "IRON", "RETICCTPCT" in the last 72 hours. Urine analysis:    Component Value Date/Time   COLORURINE YELLOW 05/20/2023 2330   APPEARANCEUR CLEAR 05/20/2023 2330   LABSPEC 1.015 05/20/2023 2330   PHURINE 7.0 05/20/2023 2330   GLUCOSEU NEGATIVE 05/20/2023 2330   HGBUR NEGATIVE 05/20/2023 2330   BILIRUBINUR NEGATIVE 05/20/2023 2330   BILIRUBINUR moderate (A) 09/03/2018 1138   KETONESUR NEGATIVE 05/20/2023 2330   PROTEINUR NEGATIVE 05/20/2023 2330   UROBILINOGEN 1.0 09/03/2018 1138   UROBILINOGEN 1.0 05/16/2014 1619   NITRITE NEGATIVE 05/20/2023 2330   LEUKOCYTESUR NEGATIVE 05/20/2023 2330   Sepsis Labs: @LABRCNTIP (procalcitonin:4,lacticidven:4)  ) Recent Results (from the  past 240 hour(s))  Blood culture (routine x 2)     Status: None (Preliminary result)   Collection Time: 05/20/23 10:56 PM   Specimen: BLOOD LEFT FOREARM  Result Value Ref Range Status   Specimen Description BLOOD LEFT FOREARM  Final   Special Requests   Final    BOTTLES DRAWN AEROBIC AND ANAEROBIC Blood Culture adequate volume   Culture   Final    NO GROWTH 2 DAYS Performed at [facility], [address] [place], [place] [address]    Report Status PENDING  Incomplete  Blood culture (routine x 2)     Status: None (Preliminary result)   Collection Time: 05/20/23 10:56 PM   Specimen: BLOOD  Result Value Ref Range Status   Specimen Description BLOOD SITE NOT SPECIFIED  Final   Special Requests AEROBIC BOTTLE ONLY Blood Culture adequate volume  Final   Culture   Final    NO GROWTH 2 DAYS Performed at  [facility], [address] [place], [place] [address]    Report Status PENDING  Incomplete  SARS Coronavirus 2 by RT PCR (hospital order, performed in [facility] hospital lab) *cepheid single result test* Anterior Nasal Swab     Status: None   Collection Time: 05/20/23 11:30 PM   Specimen: Anterior Nasal Swab  Result Value Ref Range Status   SARS Coronavirus 2 by RT PCR NEGATIVE NEGATIVE Final    Comment: Performed at [facility], [address], [place], [place] [address]  MRSA Next Gen by PCR, Nasal     Status: Abnormal   Collection Time: 05/21/23  3:09 AM   Specimen: Nasal Mucosa; Nasal Swab  Result Value Ref Range Status   MRSA by PCR Next Gen DETECTED (A) NOT DETECTED Final    Comment: RESULT CALLED TO, READ BACK BY AND VERIFIED WITH: MOON,K RN AT [id number] 05/21/2023 MITCHELL,L (NOTE) The GeneXpert MRSA Assay (FDA approved for NASAL specimens only), is one component of a comprehensive MRSA colonization surveillance program. It is not intended to diagnose MRSA infection nor to guide or monitor treatment for MRSA infections. Test performance is not FDA approved  in patients less than 57 years old. Performed at [facility], [address] [place], [place] [address]       Studies: DG Swallowing Func-Speech Pathology  Result Date: 05/22/2023 Table formatting from the original result was not included. Modified Barium Swallow Study Patient Details Name: Cynthia Underwood MRN: [id number] Date of Birth: [birth year] Today's Date: 05/22/2023 HPI/PMH: HPI: BRAILY HENNIS is a 52 yo female presenting to ED 9/16 with generalized pain and new onset of productive cough with green sputum. PEG in place since April 2024, although per note, pt states that she attains most of her nutrition by mouth and eats "regular food". CXR with new R mid lung opacity consistent with acute infiltrate. MBS 01/25/23 with severe dysphagia characterized by minimal movement of POs through the pharynx, requiring multiple subswallows and resulting in almost total stasis of purees which required oral suctioning to clear. At that time, airway protection was surprisingly effective with no aspiration noted. SLP recommended NPO with small sips of water/ice chips per pt's preference. PMH includes myotonic dystrophy, c/b heart block, PPM, PNA and aspiration event resulting in VF arrest and requiring trach and PEG, HFrEF, total thyroidectomy, hypothyroidism, COPD requiring supplemental O2 at night Clinical Impression: Clinical Impression: Pt presents with a severe pharyngeal dysphagia characterized primarily by reduced strength, resulting in almonst complete retention of all consistencies administered. Pt achieves little to no base of tongue retraction resulting in incomplete oral clearance and residue present along the BOT and valleculae. Pt has minimal UES opening, resulting in almost complete retention of barium consistencies in the pharynx. She also has diffuse residue present throughout the pharynx which is only intermittently able to be cleared with the use of cued effortful swallow and multiple  swallows. She is unable to achieve epiglottic inversion allowing airway invasion. Pt aspirated trials of thin liquids, although demonstrated a strong cough response (PAS 7). She was intermittently able to clear aspirates from the trachea. A chin tuck was effective in preventing aspiration, although ineffective at reducing amount of residue which is suspected to contribute to aspiration when the laryngeal vestibule remains open at rest. Nectar thick liquids and purees did not result in any further penetration or aspiration, although note almost total stasis of these thicker consistencies. Pt initiating use of multiple swallows independently, although  noted to be minimally effective as she lacks the strength to successfully clear pharyngeal residue. Additionally, an effortful swallow was ineffective as baseline swallowing appears to require moderate effort already. Esophageal retention noted during fluoro sweep as well. Recommend continuing NPO, although with adherence to the Free Water Protocol only after thorough oral care. Will continue to follow acutely to reinforce aspiration precautions. She will continue to require intensive SLP intervention following d/c. Factors that may increase risk of adverse event in presence of aspiration Rubye Oaks & Clearance Coots 2021): Factors that may increase risk of adverse event in presence of aspiration Rubye Oaks & Clearance Coots 2021): Limited mobility Recommendations/Plan: Swallowing Evaluation Recommendations Swallowing Evaluation Recommendations Recommendations: NPO; Alternative means of nutrition - G Tube; Free water protocol after oral care Medication Administration: Via alternative means Treatment Plan Treatment Plan Treatment recommendations: Therapy as outlined in treatment plan below Follow-up recommendations: Home health SLP Functional status assessment: Patient has had a recent decline in their functional status and demonstrates the ability to make significant improvements in function  in a reasonable and predictable amount of time. Treatment frequency: Min 2x/week Treatment duration: 2 weeks Interventions: Aspiration precaution training; Compensatory techniques; Patient/family education Recommendations Recommendations for follow up therapy are one component of a multi-disciplinary discharge planning process, led by the attending physician.  Recommendations may be updated based on patient status, additional functional criteria and insurance authorization. Assessment: Orofacial Exam: Orofacial Exam Oral Cavity: Oral Hygiene: WFL Oral Cavity - Dentition: Adequate natural dentition Orofacial Anatomy: WFL Oral Motor/Sensory Function: WFL Anatomy: Anatomy: WFL Boluses Administered: Boluses Administered Boluses Administered: Thin liquids (Level 0); Mildly thick liquids (Level 2, nectar thick); Puree  Oral Impairment Domain: Oral Impairment Domain Lip Closure: No labial escape Tongue control during bolus hold: Cohesive bolus between tongue to palatal seal Bolus transport/lingual motion: Brisk tongue motion Oral residue: Trace residue lining oral structures Location of oral residue : Tongue; Palate Initiation of pharyngeal swallow : Pyriform sinuses  Pharyngeal Impairment Domain: Pharyngeal Impairment Domain Soft palate elevation: No bolus between soft palate (SP)/pharyngeal wall (PW) Laryngeal elevation: Complete superior movement of thyroid cartilage with complete approximation of arytenoids to epiglottic petiole Anterior hyoid excursion: Complete anterior movement Epiglottic movement: No inversion Laryngeal vestibule closure: Incomplete, narrow column air/contrast in laryngeal vestibule Pharyngeal stripping wave : Present - diminished Pharyngeal contraction (A/P view only): N/A Pharyngoesophageal segment opening: Minimal distention/minimal duration, marked obstruction of flow Tongue base retraction: Wide column of contrast or air between tongue base and PPW Pharyngeal residue: Minimal to no pharyngeal  clearance Location of pharyngeal residue: Tongue base; Valleculae; Pharyngeal wall; Pyriform sinuses; Diffuse (>3 areas)  Esophageal Impairment Domain: Esophageal Impairment Domain Esophageal clearance upright position: Esophageal retention Pill: No data recorded Penetration/Aspiration Scale Score: Penetration/Aspiration Scale Score 1.  Material does not enter airway: Mildly thick liquids (Level 2, nectar thick); Puree 7.  Material enters airway, passes BELOW cords and not ejected out despite cough attempt by patient: Thin liquids (Level 0) Compensatory Strategies: Compensatory Strategies Compensatory strategies: Yes Straw: Effective; Ineffective Effective Straw: Mildly thick liquid (Level 2, nectar thick) Ineffective Straw: Thin liquid (Level 0) Effortful swallow: Ineffective Ineffective Effortful Swallow: Thin liquid (Level 0); Mildly thick liquid (Level 2, nectar thick); Puree Multiple swallows: Ineffective Ineffective Multiple Swallows: Thin liquid (Level 0); Mildly thick liquid (Level 2, nectar thick); Puree Chin tuck: Ineffective Ineffective Chin Tuck: Thin liquid (Level 0)   General Information: Caregiver present: No  Diet Prior to this Study: NPO; G-tube   Temperature : Normal   Respiratory Status: [facility]  Supplemental O2: Nasal cannula   History of Recent Intubation: No  Behavior/Cognition: Alert; Cooperative; Pleasant mood Self-Feeding Abilities: Able to self-feed Baseline vocal quality/speech: Hypophonia/low volume Volitional Cough: Able to elicit Volitional Swallow: Able to elicit Exam Limitations: No limitations Goal Planning: Prognosis for improved oropharyngeal function: Good Barriers to Reach Goals: Time post onset; Severity of deficits No data recorded Patient/Family Stated Goal: none stated Consulted and agree with results and recommendations: Patient Pain: Pain Assessment Pain Assessment: No/denies pain End of Session: Start Time:SLP Start Time (ACUTE ONLY): 1225 Stop Time: SLP Stop Time (ACUTE  ONLY): 1245 Time Calculation:SLP Time Calculation (min) (ACUTE ONLY): 20 min Charges: SLP Evaluations $ SLP Speech Visit: 1 Visit SLP Evaluations $BSS Swallow: 1 Procedure $MBS Swallow: 1 Procedure $Swallowing Treatment: 1 Procedure SLP visit diagnosis: SLP Visit Diagnosis: Dysphagia, pharyngoesophageal phase (R13.14) Past Medical History: Past Medical History: Diagnosis Date  Bifascicular block 10/02/2014  Cataract   Dyspnea   Excess ear wax   Multinodular goiter   Pneumonia   Presence of permanent cardiac pacemaker   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  Sickle cell trait (HCC)   Watery eyes   Left  Weakness of both legs  Past Surgical History: Past Surgical History: Procedure Laterality Date  BREAST BIOPSY Right   EYE SURGERY Left 2017  "related to blockage in my nose"  EYE SURGERY Right 05/2019  INSERT / REPLACE / REMOVE PACEMAKER  02/15/2017  IR GASTROSTOMY TUBE MOD SED  01/01/2023  PACEMAKER IMPLANT N/A 02/15/2017  Procedure: Pacemaker Implant;  Surgeon: Duke Salvia, MD;  Location: [facility] INVASIVE CV LAB;  Service: Cardiovascular;  Laterality: N/A;  RIGHT/LEFT HEART CATH AND CORONARY ANGIOGRAPHY N/A 11/16/2022  Procedure: RIGHT/LEFT HEART CATH AND CORONARY ANGIOGRAPHY;  Surgeon: Laurey Morale, MD;  Location: [facility] INVASIVE CV LAB;  Service: Cardiovascular;  Laterality: N/A;  THYROIDECTOMY N/A 04/21/2021  Procedure: TOTAL THYROIDECTOMY;  Surgeon: Axel Filler, MD;  Location: [facility] OR;  Service: General;  Laterality: N/A; Gwynneth Aliment, M.A., CF-SLP Speech Language Pathology, Acute Rehabilitation Services Secure Chat preferred [phone] 05/22/2023, 2:02 PM   Scheduled Meds:  digoxin  250 mcg Per Tube Daily   enoxaparin (LOVENOX) injection  30 mg Subcutaneous Q24H   free water  120 mL Per Tube Q4H   levothyroxine  112 mcg Per Tube QAC breakfast   metroNIDAZOLE  500 mg Per Tube Q12H   QUEtiapine  25 mg Per Tube QHS   sodium chloride flush  3 mL Intravenous Q12H     Continuous Infusions:  cefTRIAXone (ROCEPHIN)  IV 2 g (05/22/23 1437)   dextrose 5 % and 0.45 % NaCl 75 mL/hr at 05/22/23 0808   feeding supplement (JEVITY 1.5 CAL/FIBER) 1,000 mL (05/22/23 1458)     LOS: 1 day     Briant Cedar, MD [facility]  If 7PM-7AM, please contact night-coverage [email or web address] 05/22/2023, 3:56 PM

## 2023-05-23 DIAGNOSIS — J690 Pneumonitis due to inhalation of food and vomit: Secondary | ICD-10-CM | POA: Diagnosis not present

## 2023-05-23 LAB — GLUCOSE, CAPILLARY
Glucose-Capillary: 100 mg/dL — ABNORMAL HIGH (ref 70–99)
Glucose-Capillary: 101 mg/dL — ABNORMAL HIGH (ref 70–99)
Glucose-Capillary: 117 mg/dL — ABNORMAL HIGH (ref 70–99)
Glucose-Capillary: 127 mg/dL — ABNORMAL HIGH (ref 70–99)
Glucose-Capillary: 86 mg/dL (ref 70–99)
Glucose-Capillary: 95 mg/dL (ref 70–99)

## 2023-05-23 MED ORDER — POTASSIUM CHLORIDE 20 MEQ PO PACK
40.0000 meq | PACK | Freq: Two times a day (BID) | ORAL | Status: AC
  Administered 2023-05-23 – 2023-05-24 (×2): 40 meq
  Filled 2023-05-23 (×2): qty 2

## 2023-05-23 MED ORDER — POTASSIUM CHLORIDE CRYS ER 20 MEQ PO TBCR
40.0000 meq | EXTENDED_RELEASE_TABLET | Freq: Two times a day (BID) | ORAL | Status: DC
  Filled 2023-05-23: qty 2

## 2023-05-23 MED ORDER — MUPIROCIN 2 % EX OINT
1.0000 | TOPICAL_OINTMENT | Freq: Two times a day (BID) | CUTANEOUS | Status: DC
  Administered 2023-05-23 – 2023-05-25 (×5): 1 via NASAL
  Filled 2023-05-23 (×2): qty 22

## 2023-05-23 MED ORDER — CHLORHEXIDINE GLUCONATE CLOTH 2 % EX PADS: 6.0000 | MEDICATED_PAD | Freq: Every day | CUTANEOUS | Status: DC

## 2023-05-23 NOTE — Progress Notes (Signed)
Speech Language Pathology Treatment: Dysphagia  Patient Details Name: Cynthia Underwood MRN: [id number] DOB: [birth year] Today's Date: 05/23/2023 Time: 4401-0272 SLP Time Calculation (min) (ACUTE ONLY): 8 min  Assessment / Plan / Recommendation Clinical Impression  Discussed results of MBS with pt, who reports feeling saddened by being NPO. Described severity of pharyngeal dysphagia, which is currently contributing to aspiration. Discussed potential to participate in [facility] SLP to target compensatory strategies such as use of a chin tuck to reduce risk of aspiration. Provided education regarding aspiration precautions to mitigate risk. Recommend she remain NPO. Encourage frequent and thorough oral care. Will continue to follow to assess use of chin tuck posture with trials of thin liquids.    HPI HPI: Cynthia Underwood is a 52 yo female presenting to ED 9/16 with generalized pain and new onset of productive cough with green sputum. PEG in place since April 2024, although per note, pt states that she attains most of her nutrition by mouth and eats "regular food". CXR with new R mid lung opacity consistent with acute infiltrate. MBS 01/25/23 with severe dysphagia characterized by minimal movement of POs through the pharynx, requiring multiple subswallows and resulting in almost total stasis of purees which required oral suctioning to clear. At that time, airway protection was surprisingly effective with no aspiration noted. SLP recommended NPO with small sips of water/ice chips per pt's preference. PMH includes myotonic dystrophy, c/b heart block, PPM, PNA and aspiration event resulting in VF arrest and requiring trach and PEG, HFrEF, total thyroidectomy, hypothyroidism, COPD requiring supplemental O2 at night      SLP Plan  Continue with current plan of care      Recommendations for follow up therapy are one component of a multi-disciplinary discharge planning process, led by the attending physician.   Recommendations may be updated based on patient status, additional functional criteria and insurance authorization.    Recommendations  Diet recommendations: NPO Medication Administration: Via alternative means                  Oral care QID   Frequent or constant Supervision/Assistance Dysphagia, pharyngoesophageal phase (R13.14)     Continue with current plan of care     Cynthia Underwood, M.A., CF-SLP Speech Language Pathology, Acute Rehabilitation Services  Secure Chat preferred [phone]   05/23/2023, 3:00 PM

## 2023-05-23 NOTE — Progress Notes (Signed)
Lipid Profile: No results for input(s): "CHOL", "HDL", "LDLCALC", "TRIG", "CHOLHDL", "LDLDIRECT" in the last 72 hours. Thyroid Function Tests: No results for input(s): "TSH", "T4TOTAL", "FREET4", "T3FREE", "THYROIDAB" in the last 72 hours. Anemia Panel: No results for input(s): "VITAMINB12", "FOLATE", "FERRITIN", "TIBC", "IRON", "RETICCTPCT" in the last 72 hours. Sepsis Labs: Recent Labs  Lab 05/20/23 2256 05/21/23 0149 05/21/23 0501  LATICACIDVEN 3.1* 2.0* 1.2    Recent Results (from the past 240 hour(s))  Blood culture (routine x 2)     Status: None (Preliminary result)   Collection Time: 05/20/23 10:56 PM   Specimen: BLOOD LEFT FOREARM  Result Value Ref Range Status   Specimen Description BLOOD LEFT FOREARM  Final   Special Requests   Final    BOTTLES DRAWN AEROBIC AND ANAEROBIC Blood Culture adequate volume   Culture   Final    NO GROWTH 2 DAYS Performed at [facility], [address] [place], [place] [address]    Report Status PENDING  Incomplete  Blood culture (routine x 2)     Status: None (Preliminary result)   Collection Time: 05/20/23 10:56 PM   Specimen: BLOOD  Result Value Ref Range Status   Specimen Description BLOOD SITE NOT SPECIFIED  Final   Special Requests AEROBIC BOTTLE ONLY Blood Culture adequate volume  Final   Culture   Final    NO GROWTH 2 DAYS Performed at [facility], [address] [place], [place] [address]    Report Status PENDING  Incomplete  SARS  Coronavirus 2 by RT PCR (hospital order, performed in [facility] hospital lab) *cepheid single result test* Anterior Nasal Swab     Status: None   Collection Time: 05/20/23 11:30 PM   Specimen: Anterior Nasal Swab  Result Value Ref Range Status   SARS Coronavirus 2 by RT PCR NEGATIVE NEGATIVE Final    Comment: Performed at [facility], [address], [place], [place] [address]  MRSA Next Gen by PCR, Nasal     Status: Abnormal   Collection Time: 05/21/23  3:09 AM   Specimen: Nasal Mucosa; Nasal Swab  Result Value Ref Range Status   MRSA by PCR Next Gen DETECTED (A) NOT DETECTED Final    Comment: RESULT CALLED TO, READ BACK BY AND VERIFIED WITH: MOON,K RN AT [phone] 05/21/2023 MITCHELL,L (NOTE) The GeneXpert MRSA Assay (FDA approved for NASAL specimens only), is one component of a comprehensive MRSA colonization surveillance program. It is not intended to diagnose MRSA infection nor to guide or monitor treatment for MRSA infections. Test performance is not FDA approved in patients less than 44 years old. Performed at [facility], [address], [place], [place] [address]          Radiology Studies: DG Swallowing Func-Speech Pathology  Result Date: 05/22/2023 Table formatting from the original result was not included. Modified Barium Swallow Study Patient Details Name: SULAMITA NASH MRN: [id number] Date of Birth: 1971-02-16 Today's Date: 05/22/2023 HPI/PMH: HPI: SIDNY RAMADAN is a 52 yo female presenting to ED 9/16 with generalized pain and new onset of productive cough with green sputum. PEG in place since April 2024, although per note, pt states that she attains most of her nutrition by mouth and eats "regular food". CXR with new R mid lung opacity consistent with acute infiltrate. MBS 01/25/23 with severe dysphagia characterized by minimal movement of POs through the pharynx, requiring multiple subswallows and resulting in almost total stasis of purees which  Services Secure Chat preferred [phone] 05/22/2023, 2:02 PM  ECHOCARDIOGRAM COMPLETE  Result Date: 05/21/2023    ECHOCARDIOGRAM REPORT   Patient Name:   Cynthia Underwood Date of Exam: 05/21/2023 Medical Rec #:  [id number]         Height:       63.0 in Accession #:    [id number]        Weight:       88.1 lb Date of Birth:  May 17, 1971        BSA:          1.365 m Patient Age:    51 years          BP:           86/56 mmHg Patient Gender: F                 HR:           71 bpm. Exam Location:  Inpatient Procedure: 2D Echo, Cardiac Doppler and Color Doppler Indications:    Congestive Heart Failure I50.9  History:        Patient has prior history of Echocardiogram examinations, most                 recent 11/15/2022. CHF, Pacemaker, Arrythmias:RBBB; Risk                 Factors:Former Smoker.  Sonographer:    Aron Baba Referring Phys: [id number] [facility]  Sonographer Comments: Suboptimal subcostal window. Image acquisition challenging due to respiratory motion and Image acquisition challenging due to patient body habitus. IMPRESSIONS  1. Left ventricular ejection fraction, by estimation, is 55 to 60%. The left ventricle has normal function. The left ventricle has no regional wall motion abnormalities. Left ventricular diastolic parameters were normal.  2. Right ventricular systolic  function is normal. The right ventricular size is normal. There is normal pulmonary artery systolic pressure.  3. A small pericardial effusion is present.  4. The mitral valve is normal in structure. No evidence of mitral valve regurgitation. No evidence of mitral stenosis.  5. The aortic valve is tricuspid. Aortic valve regurgitation is not visualized. No aortic stenosis is present.  6. The inferior vena cava is normal in size with greater than 50% respiratory variability, suggesting right atrial pressure of 3 mmHg. FINDINGS  Left Ventricle: Left ventricular ejection fraction, by estimation, is 55 to 60%. The left ventricle has normal function. The left ventricle has no regional wall motion abnormalities. The left ventricular internal cavity size was normal in size. There is  no left ventricular hypertrophy. Left ventricular diastolic parameters were normal. Right Ventricle: The right ventricular size is normal. Right ventricular systolic function is normal. There is normal pulmonary artery systolic pressure. The tricuspid regurgitant velocity is 2.36 m/s, and with an assumed right atrial pressure of 3 mmHg,  the estimated right ventricular systolic pressure is 25.3 mmHg. Left Atrium: Left atrial size was normal in size. Right Atrium: Right atrial size was normal in size. Pericardium: A small pericardial effusion is present. Mitral Valve: The mitral valve is normal in structure. No evidence of mitral valve regurgitation. No evidence of mitral valve stenosis. Tricuspid Valve: The tricuspid valve is normal in structure. Tricuspid valve regurgitation is mild . No evidence of tricuspid stenosis. Aortic Valve: The aortic valve is tricuspid. Aortic valve regurgitation is not visualized. No aortic stenosis is present. Pulmonic Valve: The pulmonic valve was normal in structure. Pulmonic valve regurgitation is not visualized. No evidence of  Services Secure Chat preferred [phone] 05/22/2023, 2:02 PM  ECHOCARDIOGRAM COMPLETE  Result Date: 05/21/2023    ECHOCARDIOGRAM REPORT   Patient Name:   Cynthia Underwood Date of Exam: 05/21/2023 Medical Rec #:  [id number]         Height:       63.0 in Accession #:    [id number]        Weight:       88.1 lb Date of Birth:  May 17, 1971        BSA:          1.365 m Patient Age:    51 years          BP:           86/56 mmHg Patient Gender: F                 HR:           71 bpm. Exam Location:  Inpatient Procedure: 2D Echo, Cardiac Doppler and Color Doppler Indications:    Congestive Heart Failure I50.9  History:        Patient has prior history of Echocardiogram examinations, most                 recent 11/15/2022. CHF, Pacemaker, Arrythmias:RBBB; Risk                 Factors:Former Smoker.  Sonographer:    Aron Baba Referring Phys: [id number] [facility]  Sonographer Comments: Suboptimal subcostal window. Image acquisition challenging due to respiratory motion and Image acquisition challenging due to patient body habitus. IMPRESSIONS  1. Left ventricular ejection fraction, by estimation, is 55 to 60%. The left ventricle has normal function. The left ventricle has no regional wall motion abnormalities. Left ventricular diastolic parameters were normal.  2. Right ventricular systolic  function is normal. The right ventricular size is normal. There is normal pulmonary artery systolic pressure.  3. A small pericardial effusion is present.  4. The mitral valve is normal in structure. No evidence of mitral valve regurgitation. No evidence of mitral stenosis.  5. The aortic valve is tricuspid. Aortic valve regurgitation is not visualized. No aortic stenosis is present.  6. The inferior vena cava is normal in size with greater than 50% respiratory variability, suggesting right atrial pressure of 3 mmHg. FINDINGS  Left Ventricle: Left ventricular ejection fraction, by estimation, is 55 to 60%. The left ventricle has normal function. The left ventricle has no regional wall motion abnormalities. The left ventricular internal cavity size was normal in size. There is  no left ventricular hypertrophy. Left ventricular diastolic parameters were normal. Right Ventricle: The right ventricular size is normal. Right ventricular systolic function is normal. There is normal pulmonary artery systolic pressure. The tricuspid regurgitant velocity is 2.36 m/s, and with an assumed right atrial pressure of 3 mmHg,  the estimated right ventricular systolic pressure is 25.3 mmHg. Left Atrium: Left atrial size was normal in size. Right Atrium: Right atrial size was normal in size. Pericardium: A small pericardial effusion is present. Mitral Valve: The mitral valve is normal in structure. No evidence of mitral valve regurgitation. No evidence of mitral valve stenosis. Tricuspid Valve: The tricuspid valve is normal in structure. Tricuspid valve regurgitation is mild . No evidence of tricuspid stenosis. Aortic Valve: The aortic valve is tricuspid. Aortic valve regurgitation is not visualized. No aortic stenosis is present. Pulmonic Valve: The pulmonic valve was normal in structure. Pulmonic valve regurgitation is not visualized. No evidence of  Lipid Profile: No results for input(s): "CHOL", "HDL", "LDLCALC", "TRIG", "CHOLHDL", "LDLDIRECT" in the last 72 hours. Thyroid Function Tests: No results for input(s): "TSH", "T4TOTAL", "FREET4", "T3FREE", "THYROIDAB" in the last 72 hours. Anemia Panel: No results for input(s): "VITAMINB12", "FOLATE", "FERRITIN", "TIBC", "IRON", "RETICCTPCT" in the last 72 hours. Sepsis Labs: Recent Labs  Lab 05/20/23 2256 05/21/23 0149 05/21/23 0501  LATICACIDVEN 3.1* 2.0* 1.2    Recent Results (from the past 240 hour(s))  Blood culture (routine x 2)     Status: None (Preliminary result)   Collection Time: 05/20/23 10:56 PM   Specimen: BLOOD LEFT FOREARM  Result Value Ref Range Status   Specimen Description BLOOD LEFT FOREARM  Final   Special Requests   Final    BOTTLES DRAWN AEROBIC AND ANAEROBIC Blood Culture adequate volume   Culture   Final    NO GROWTH 2 DAYS Performed at [facility], [address] [place], [place] [address]    Report Status PENDING  Incomplete  Blood culture (routine x 2)     Status: None (Preliminary result)   Collection Time: 05/20/23 10:56 PM   Specimen: BLOOD  Result Value Ref Range Status   Specimen Description BLOOD SITE NOT SPECIFIED  Final   Special Requests AEROBIC BOTTLE ONLY Blood Culture adequate volume  Final   Culture   Final    NO GROWTH 2 DAYS Performed at [facility], [address] [place], [place] [address]    Report Status PENDING  Incomplete  SARS  Coronavirus 2 by RT PCR (hospital order, performed in [facility] hospital lab) *cepheid single result test* Anterior Nasal Swab     Status: None   Collection Time: 05/20/23 11:30 PM   Specimen: Anterior Nasal Swab  Result Value Ref Range Status   SARS Coronavirus 2 by RT PCR NEGATIVE NEGATIVE Final    Comment: Performed at [facility], [address], [place], [place] [address]  MRSA Next Gen by PCR, Nasal     Status: Abnormal   Collection Time: 05/21/23  3:09 AM   Specimen: Nasal Mucosa; Nasal Swab  Result Value Ref Range Status   MRSA by PCR Next Gen DETECTED (A) NOT DETECTED Final    Comment: RESULT CALLED TO, READ BACK BY AND VERIFIED WITH: MOON,K RN AT [phone] 05/21/2023 MITCHELL,L (NOTE) The GeneXpert MRSA Assay (FDA approved for NASAL specimens only), is one component of a comprehensive MRSA colonization surveillance program. It is not intended to diagnose MRSA infection nor to guide or monitor treatment for MRSA infections. Test performance is not FDA approved in patients less than 44 years old. Performed at [facility], [address], [place], [place] [address]          Radiology Studies: DG Swallowing Func-Speech Pathology  Result Date: 05/22/2023 Table formatting from the original result was not included. Modified Barium Swallow Study Patient Details Name: SULAMITA NASH MRN: [id number] Date of Birth: 1971-02-16 Today's Date: 05/22/2023 HPI/PMH: HPI: SIDNY RAMADAN is a 52 yo female presenting to ED 9/16 with generalized pain and new onset of productive cough with green sputum. PEG in place since April 2024, although per note, pt states that she attains most of her nutrition by mouth and eats "regular food". CXR with new R mid lung opacity consistent with acute infiltrate. MBS 01/25/23 with severe dysphagia characterized by minimal movement of POs through the pharynx, requiring multiple subswallows and resulting in almost total stasis of purees which  Lipid Profile: No results for input(s): "CHOL", "HDL", "LDLCALC", "TRIG", "CHOLHDL", "LDLDIRECT" in the last 72 hours. Thyroid Function Tests: No results for input(s): "TSH", "T4TOTAL", "FREET4", "T3FREE", "THYROIDAB" in the last 72 hours. Anemia Panel: No results for input(s): "VITAMINB12", "FOLATE", "FERRITIN", "TIBC", "IRON", "RETICCTPCT" in the last 72 hours. Sepsis Labs: Recent Labs  Lab 05/20/23 2256 05/21/23 0149 05/21/23 0501  LATICACIDVEN 3.1* 2.0* 1.2    Recent Results (from the past 240 hour(s))  Blood culture (routine x 2)     Status: None (Preliminary result)   Collection Time: 05/20/23 10:56 PM   Specimen: BLOOD LEFT FOREARM  Result Value Ref Range Status   Specimen Description BLOOD LEFT FOREARM  Final   Special Requests   Final    BOTTLES DRAWN AEROBIC AND ANAEROBIC Blood Culture adequate volume   Culture   Final    NO GROWTH 2 DAYS Performed at [facility], [address] [place], [place] [address]    Report Status PENDING  Incomplete  Blood culture (routine x 2)     Status: None (Preliminary result)   Collection Time: 05/20/23 10:56 PM   Specimen: BLOOD  Result Value Ref Range Status   Specimen Description BLOOD SITE NOT SPECIFIED  Final   Special Requests AEROBIC BOTTLE ONLY Blood Culture adequate volume  Final   Culture   Final    NO GROWTH 2 DAYS Performed at [facility], [address] [place], [place] [address]    Report Status PENDING  Incomplete  SARS  Coronavirus 2 by RT PCR (hospital order, performed in [facility] hospital lab) *cepheid single result test* Anterior Nasal Swab     Status: None   Collection Time: 05/20/23 11:30 PM   Specimen: Anterior Nasal Swab  Result Value Ref Range Status   SARS Coronavirus 2 by RT PCR NEGATIVE NEGATIVE Final    Comment: Performed at [facility], [address], [place], [place] [address]  MRSA Next Gen by PCR, Nasal     Status: Abnormal   Collection Time: 05/21/23  3:09 AM   Specimen: Nasal Mucosa; Nasal Swab  Result Value Ref Range Status   MRSA by PCR Next Gen DETECTED (A) NOT DETECTED Final    Comment: RESULT CALLED TO, READ BACK BY AND VERIFIED WITH: MOON,K RN AT [phone] 05/21/2023 MITCHELL,L (NOTE) The GeneXpert MRSA Assay (FDA approved for NASAL specimens only), is one component of a comprehensive MRSA colonization surveillance program. It is not intended to diagnose MRSA infection nor to guide or monitor treatment for MRSA infections. Test performance is not FDA approved in patients less than 44 years old. Performed at [facility], [address], [place], [place] [address]          Radiology Studies: DG Swallowing Func-Speech Pathology  Result Date: 05/22/2023 Table formatting from the original result was not included. Modified Barium Swallow Study Patient Details Name: SULAMITA NASH MRN: [id number] Date of Birth: 1971-02-16 Today's Date: 05/22/2023 HPI/PMH: HPI: SIDNY RAMADAN is a 52 yo female presenting to ED 9/16 with generalized pain and new onset of productive cough with green sputum. PEG in place since April 2024, although per note, pt states that she attains most of her nutrition by mouth and eats "regular food". CXR with new R mid lung opacity consistent with acute infiltrate. MBS 01/25/23 with severe dysphagia characterized by minimal movement of POs through the pharynx, requiring multiple subswallows and resulting in almost total stasis of purees which  Services Secure Chat preferred [phone] 05/22/2023, 2:02 PM  ECHOCARDIOGRAM COMPLETE  Result Date: 05/21/2023    ECHOCARDIOGRAM REPORT   Patient Name:   Cynthia Underwood Date of Exam: 05/21/2023 Medical Rec #:  [id number]         Height:       63.0 in Accession #:    [id number]        Weight:       88.1 lb Date of Birth:  May 17, 1971        BSA:          1.365 m Patient Age:    51 years          BP:           86/56 mmHg Patient Gender: F                 HR:           71 bpm. Exam Location:  Inpatient Procedure: 2D Echo, Cardiac Doppler and Color Doppler Indications:    Congestive Heart Failure I50.9  History:        Patient has prior history of Echocardiogram examinations, most                 recent 11/15/2022. CHF, Pacemaker, Arrythmias:RBBB; Risk                 Factors:Former Smoker.  Sonographer:    Aron Baba Referring Phys: [id number] [facility]  Sonographer Comments: Suboptimal subcostal window. Image acquisition challenging due to respiratory motion and Image acquisition challenging due to patient body habitus. IMPRESSIONS  1. Left ventricular ejection fraction, by estimation, is 55 to 60%. The left ventricle has normal function. The left ventricle has no regional wall motion abnormalities. Left ventricular diastolic parameters were normal.  2. Right ventricular systolic  function is normal. The right ventricular size is normal. There is normal pulmonary artery systolic pressure.  3. A small pericardial effusion is present.  4. The mitral valve is normal in structure. No evidence of mitral valve regurgitation. No evidence of mitral stenosis.  5. The aortic valve is tricuspid. Aortic valve regurgitation is not visualized. No aortic stenosis is present.  6. The inferior vena cava is normal in size with greater than 50% respiratory variability, suggesting right atrial pressure of 3 mmHg. FINDINGS  Left Ventricle: Left ventricular ejection fraction, by estimation, is 55 to 60%. The left ventricle has normal function. The left ventricle has no regional wall motion abnormalities. The left ventricular internal cavity size was normal in size. There is  no left ventricular hypertrophy. Left ventricular diastolic parameters were normal. Right Ventricle: The right ventricular size is normal. Right ventricular systolic function is normal. There is normal pulmonary artery systolic pressure. The tricuspid regurgitant velocity is 2.36 m/s, and with an assumed right atrial pressure of 3 mmHg,  the estimated right ventricular systolic pressure is 25.3 mmHg. Left Atrium: Left atrial size was normal in size. Right Atrium: Right atrial size was normal in size. Pericardium: A small pericardial effusion is present. Mitral Valve: The mitral valve is normal in structure. No evidence of mitral valve regurgitation. No evidence of mitral valve stenosis. Tricuspid Valve: The tricuspid valve is normal in structure. Tricuspid valve regurgitation is mild . No evidence of tricuspid stenosis. Aortic Valve: The aortic valve is tricuspid. Aortic valve regurgitation is not visualized. No aortic stenosis is present. Pulmonic Valve: The pulmonic valve was normal in structure. Pulmonic valve regurgitation is not visualized. No evidence of  Lipid Profile: No results for input(s): "CHOL", "HDL", "LDLCALC", "TRIG", "CHOLHDL", "LDLDIRECT" in the last 72 hours. Thyroid Function Tests: No results for input(s): "TSH", "T4TOTAL", "FREET4", "T3FREE", "THYROIDAB" in the last 72 hours. Anemia Panel: No results for input(s): "VITAMINB12", "FOLATE", "FERRITIN", "TIBC", "IRON", "RETICCTPCT" in the last 72 hours. Sepsis Labs: Recent Labs  Lab 05/20/23 2256 05/21/23 0149 05/21/23 0501  LATICACIDVEN 3.1* 2.0* 1.2    Recent Results (from the past 240 hour(s))  Blood culture (routine x 2)     Status: None (Preliminary result)   Collection Time: 05/20/23 10:56 PM   Specimen: BLOOD LEFT FOREARM  Result Value Ref Range Status   Specimen Description BLOOD LEFT FOREARM  Final   Special Requests   Final    BOTTLES DRAWN AEROBIC AND ANAEROBIC Blood Culture adequate volume   Culture   Final    NO GROWTH 2 DAYS Performed at [facility], [address] [place], [place] [address]    Report Status PENDING  Incomplete  Blood culture (routine x 2)     Status: None (Preliminary result)   Collection Time: 05/20/23 10:56 PM   Specimen: BLOOD  Result Value Ref Range Status   Specimen Description BLOOD SITE NOT SPECIFIED  Final   Special Requests AEROBIC BOTTLE ONLY Blood Culture adequate volume  Final   Culture   Final    NO GROWTH 2 DAYS Performed at [facility], [address] [place], [place] [address]    Report Status PENDING  Incomplete  SARS  Coronavirus 2 by RT PCR (hospital order, performed in [facility] hospital lab) *cepheid single result test* Anterior Nasal Swab     Status: None   Collection Time: 05/20/23 11:30 PM   Specimen: Anterior Nasal Swab  Result Value Ref Range Status   SARS Coronavirus 2 by RT PCR NEGATIVE NEGATIVE Final    Comment: Performed at [facility], [address], [place], [place] [address]  MRSA Next Gen by PCR, Nasal     Status: Abnormal   Collection Time: 05/21/23  3:09 AM   Specimen: Nasal Mucosa; Nasal Swab  Result Value Ref Range Status   MRSA by PCR Next Gen DETECTED (A) NOT DETECTED Final    Comment: RESULT CALLED TO, READ BACK BY AND VERIFIED WITH: MOON,K RN AT [phone] 05/21/2023 MITCHELL,L (NOTE) The GeneXpert MRSA Assay (FDA approved for NASAL specimens only), is one component of a comprehensive MRSA colonization surveillance program. It is not intended to diagnose MRSA infection nor to guide or monitor treatment for MRSA infections. Test performance is not FDA approved in patients less than 44 years old. Performed at [facility], [address], [place], [place] [address]          Radiology Studies: DG Swallowing Func-Speech Pathology  Result Date: 05/22/2023 Table formatting from the original result was not included. Modified Barium Swallow Study Patient Details Name: SULAMITA NASH MRN: [id number] Date of Birth: 1971-02-16 Today's Date: 05/22/2023 HPI/PMH: HPI: SIDNY RAMADAN is a 52 yo female presenting to ED 9/16 with generalized pain and new onset of productive cough with green sputum. PEG in place since April 2024, although per note, pt states that she attains most of her nutrition by mouth and eats "regular food". CXR with new R mid lung opacity consistent with acute infiltrate. MBS 01/25/23 with severe dysphagia characterized by minimal movement of POs through the pharynx, requiring multiple subswallows and resulting in almost total stasis of purees which  Lipid Profile: No results for input(s): "CHOL", "HDL", "LDLCALC", "TRIG", "CHOLHDL", "LDLDIRECT" in the last 72 hours. Thyroid Function Tests: No results for input(s): "TSH", "T4TOTAL", "FREET4", "T3FREE", "THYROIDAB" in the last 72 hours. Anemia Panel: No results for input(s): "VITAMINB12", "FOLATE", "FERRITIN", "TIBC", "IRON", "RETICCTPCT" in the last 72 hours. Sepsis Labs: Recent Labs  Lab 05/20/23 2256 05/21/23 0149 05/21/23 0501  LATICACIDVEN 3.1* 2.0* 1.2    Recent Results (from the past 240 hour(s))  Blood culture (routine x 2)     Status: None (Preliminary result)   Collection Time: 05/20/23 10:56 PM   Specimen: BLOOD LEFT FOREARM  Result Value Ref Range Status   Specimen Description BLOOD LEFT FOREARM  Final   Special Requests   Final    BOTTLES DRAWN AEROBIC AND ANAEROBIC Blood Culture adequate volume   Culture   Final    NO GROWTH 2 DAYS Performed at [facility], [address] [place], [place] [address]    Report Status PENDING  Incomplete  Blood culture (routine x 2)     Status: None (Preliminary result)   Collection Time: 05/20/23 10:56 PM   Specimen: BLOOD  Result Value Ref Range Status   Specimen Description BLOOD SITE NOT SPECIFIED  Final   Special Requests AEROBIC BOTTLE ONLY Blood Culture adequate volume  Final   Culture   Final    NO GROWTH 2 DAYS Performed at [facility], [address] [place], [place] [address]    Report Status PENDING  Incomplete  SARS  Coronavirus 2 by RT PCR (hospital order, performed in [facility] hospital lab) *cepheid single result test* Anterior Nasal Swab     Status: None   Collection Time: 05/20/23 11:30 PM   Specimen: Anterior Nasal Swab  Result Value Ref Range Status   SARS Coronavirus 2 by RT PCR NEGATIVE NEGATIVE Final    Comment: Performed at [facility], [address], [place], [place] [address]  MRSA Next Gen by PCR, Nasal     Status: Abnormal   Collection Time: 05/21/23  3:09 AM   Specimen: Nasal Mucosa; Nasal Swab  Result Value Ref Range Status   MRSA by PCR Next Gen DETECTED (A) NOT DETECTED Final    Comment: RESULT CALLED TO, READ BACK BY AND VERIFIED WITH: MOON,K RN AT [phone] 05/21/2023 MITCHELL,L (NOTE) The GeneXpert MRSA Assay (FDA approved for NASAL specimens only), is one component of a comprehensive MRSA colonization surveillance program. It is not intended to diagnose MRSA infection nor to guide or monitor treatment for MRSA infections. Test performance is not FDA approved in patients less than 44 years old. Performed at [facility], [address], [place], [place] [address]          Radiology Studies: DG Swallowing Func-Speech Pathology  Result Date: 05/22/2023 Table formatting from the original result was not included. Modified Barium Swallow Study Patient Details Name: SULAMITA NASH MRN: [id number] Date of Birth: 1971-02-16 Today's Date: 05/22/2023 HPI/PMH: HPI: SIDNY RAMADAN is a 52 yo female presenting to ED 9/16 with generalized pain and new onset of productive cough with green sputum. PEG in place since April 2024, although per note, pt states that she attains most of her nutrition by mouth and eats "regular food". CXR with new R mid lung opacity consistent with acute infiltrate. MBS 01/25/23 with severe dysphagia characterized by minimal movement of POs through the pharynx, requiring multiple subswallows and resulting in almost total stasis of purees which

## 2023-05-23 NOTE — Evaluation (Signed)
Physical Therapy Brief Evaluation and Discharge Note Patient Details Name: Cynthia Underwood MRN: [id number] DOB: 1970-11-08 Today's Date: 05/23/2023   History of Present Illness  Pt is a 52 y/o F admitted on 05/20/23 after presenting with c/o generalized pain & new cough with green sputum. Pt is being treated for recurrent aspiration PNA, acute on chronic hypoxic respiratory failure, & severe sepsis. PMH: myotonic dystrophy, c/b heart block, PPM, PNA, aspiration event resulting in VF arrest requiring trach & PEG, HFrEF, hypothyroidism, sickle cell trait  Clinical Impression  Pt seen for PT evaluation with pt agreeable to tx. Pt reports prior to admission she was independent without AD, denies falls. On this date, pt is able to ambulate around unit without AD without LOB with gait pattern as noted below but feel this is likely baseline. Pt without overt LOB during mobility, nor c/o adverse symptoms. At this time, pt does not require acute PT services but recommend pt continue ambulating with nursing staff/mobility specialists.       PT Assessment Patient does not need any further PT services  Assistance Needed at Discharge  PRN    Equipment Recommendations None recommended by PT  Recommendations for Other Services       Precautions/Restrictions Precautions Precautions: None Restrictions Weight Bearing Restrictions: No        Mobility  Bed Mobility   Supine/Sidelying to sit: Modified independent (Device/Increased time) Sit to supine/sidelying: Modified independent (Device/Increased time)    Transfers Overall transfer level: Independent Equipment used: None               General transfer comment: STS without AD    Ambulation/Gait Ambulation/Gait assistance: Modified independent (Device/Increase time) Gait Distance (Feet):  (>150 ft) Assistive device: None Gait Pattern/deviations: Decreased dorsiflexion - right, Decreased dorsiflexion - left, Decreased stride  length Gait Speed: Below normal General Gait Details: decreased heel strike BLE, decreased foot clearance.  Home Activity Instructions    Stairs            Modified Rankin (Stroke Patients Only)        Balance Overall balance assessment: Mild deficits observed, not formally tested Sitting-balance support: Feet supported Sitting balance-Leahy Scale: Normal     Standing balance support: During functional activity, No upper extremity supported Standing balance-Leahy Scale: Good            Pertinent Vitals/Pain   Pain Assessment Pain Assessment: No/denies pain     Home Living Family/patient expects to be discharged to:: Private residence Living Arrangements: Other relatives (cousin) Available Help at Discharge: Family Home Environment: Level entry   Home Equipment: Rollator (4 wheels)   Additional Comments: flight of stairs to 2nd level but pt able to live on main level of home    Prior Function Level of Independence: Independent Comments: Independent without AD, denies falls in the past 6 months, recently finished HHPT last month.    UE/LE Assessment   UE ROM/Strength/Tone/Coordination: WFL    LE ROM/Strength/Tone/Coordination: Generalized weakness      Communication   Communication Communication: Difficulty communicating thoughts/reduced clarity of speech (speaks at low volume)     Cognition Overall Cognitive Status: Appears within functional limits for tasks assessed/performed (flat, but follows commands throughout session, pleasant, willing to participate)       General Comments General comments (skin integrity, edema, etc.): Pt on 3L/min via nasal cannula throughout session, SpO2 before & after gait >90%. Pt denies lightheadedness/dizziness, no c/o SOB.    Exercises     Assessment/Plan  PT Problem List         PT Visit Diagnosis      No Skilled PT Patient is modified independent with all activity/mobility;Patient at baseline level  of functioning   Co-evaluation                AMPAC 6 Clicks Help needed turning from your back to your side while in a flat bed without using bedrails?: None Help needed moving from lying on your back to sitting on the side of a flat bed without using bedrails?: None Help needed moving to and from a bed to a chair (including a wheelchair)?: None Help needed standing up from a chair using your arms (e.g., wheelchair or bedside chair)?: None Help needed to walk in hospital room?: None Help needed climbing 3-5 steps with a railing? : None 6 Click Score: 24      End of Session Equipment Utilized During Treatment: Oxygen Activity Tolerance: Patient tolerated treatment well Patient left: in bed;with call bell/phone within reach;with bed alarm set (HOB >30 degrees (received with bed at 27 degrees)) Nurse Communication: Mobility status       Time: 2952-8413 PT Time Calculation (min) (ACUTE ONLY): 17 min  Charges:   PT Evaluation $PT Eval Low Complexity: 1 Low      Cynthia Underwood, PT, DPT 05/23/23, 2:08 PM   Cynthia Underwood  05/23/2023, 2:07 PM

## 2023-05-24 ENCOUNTER — Ambulatory Visit: Payer: 59

## 2023-05-24 DIAGNOSIS — J690 Pneumonitis due to inhalation of food and vomit: Secondary | ICD-10-CM | POA: Diagnosis not present

## 2023-05-24 LAB — BASIC METABOLIC PANEL
Anion gap: 10 (ref 5–15)
BUN: <5 mg/dL — ABNORMAL LOW (ref 6–20)
CO2: 29 mmol/L (ref 22–32)
Calcium: 9.2 mg/dL (ref 8.9–10.3)
Chloride: 103 mmol/L (ref 98–111)
Creatinine, Ser: 0.51 mg/dL (ref 0.44–1.00)
GFR, Estimated: >60 mL/min (ref >60)
Glucose, Bld: 85 mg/dL (ref 70–99)
Potassium: 4.1 mmol/L (ref 3.5–5.1)
Sodium: 142 mmol/L (ref 135–145)

## 2023-05-24 LAB — CBC WITH DIFFERENTIAL/PLATELET
Abs Immature Granulocytes: 0.03 10*3/uL (ref 0.00–0.07)
Basophils Absolute: 0 10*3/uL (ref 0.0–0.1)
Basophils Relative: 0 %
Eosinophils Absolute: 0.1 10*3/uL (ref 0.0–0.5)
Eosinophils Relative: 2 %
HCT: 37.7 % (ref 36.0–46.0)
Hemoglobin: 11.6 g/dL — ABNORMAL LOW (ref 12.0–15.0)
Immature Granulocytes: 1 %
Lymphocytes Relative: 35 %
Lymphs Abs: 1.8 10*3/uL (ref 0.7–4.0)
MCH: 29.4 pg (ref 26.0–34.0)
MCHC: 30.8 g/dL (ref 30.0–36.0)
MCV: 95.7 fL (ref 80.0–100.0)
Monocytes Absolute: 0.4 10*3/uL (ref 0.1–1.0)
Monocytes Relative: 7 %
Neutro Abs: 2.9 10*3/uL (ref 1.7–7.7)
Neutrophils Relative %: 55 %
Platelets: 351 10*3/uL (ref 150–400)
RBC: 3.94 MIL/uL (ref 3.87–5.11)
RDW: 14.3 % (ref 11.5–15.5)
WBC: 5.3 10*3/uL (ref 4.0–10.5)
nRBC: 0 % (ref 0.0–0.2)

## 2023-05-24 LAB — GLUCOSE, CAPILLARY
Glucose-Capillary: 108 mg/dL — ABNORMAL HIGH (ref 70–99)
Glucose-Capillary: 107 mg/dL — ABNORMAL HIGH (ref 70–99)
Glucose-Capillary: 80 mg/dL (ref 70–99)
Glucose-Capillary: 83 mg/dL (ref 70–99)
Glucose-Capillary: 105 mg/dL — ABNORMAL HIGH (ref 70–99)

## 2023-05-24 LAB — MAGNESIUM: Magnesium: 2 mg/dL (ref 1.7–2.4)

## 2023-05-24 NOTE — Patient Instructions (Addendum)
Visit Information  Thank you for taking time to visit with me today. Please don't hesitate to contact me if I can be of assistance to you.   Following are the goals we discussed today:   Goals Addressed             This Visit's Progress    RN Care Coordination Activities: further follow up needed       Care Coordination Interventions: Received an inbound call from sister Gao Blauer advising patient has been readmitted for aspiration pneumonia Determined sister [name] is reaching out to ensure patient receive a feeding pump as continuous feedings are needed moving forward, patient's current supplier is [employer], per review of hospital note, nutrition is following Discussed patient did not receive the correct formula following her last hospitalization, she lost about 10 lbs Determined patient also is in need of a portable Oxygen tank for traveling to short distances, her current supplier is [employer] and she has a Insurance underwriter at home  Sent secure chat to [employer] CM and Ricardo Jericho RD, regarding patient/family concerns to be addressed prior to patient's discharge home         Our next appointment is by telephone on 06/01/23 at 2:30 PM   Please call the care guide team at [phone] if you need to cancel or reschedule your appointment.   If you are experiencing a Mental Health or Behavioral Health Crisis or need someone to talk to, please call 1-800-273-TALK (toll free, 24 hour hotline)  Patient verbalizes understanding of instructions and care plan provided today and agrees to view in MyChart. Active MyChart status and patient understanding of how to access instructions and care plan via MyChart confirmed with patient.     [name] RN BSN CCM [place]  [facility], [facility] Nurse Care Coordinator  Direct Dial: [phone] Website: [email or web address]

## 2023-05-24 NOTE — Progress Notes (Signed)
PROGRESS NOTE    MARLENE BADENHOP  WGN:[id number] DOB: [birth year] DOA: 05/20/2023 PCP: Hoy Register, MD   Brief Narrative:  52 y.o. female with hx of myotonic dystrophy, complicated by heart block requiring PPM, pneumonia and aspiration event resulting in VF arrest requiring trach and PEG, remains with PEG tube and trach has been taken down, chronic systolic heart failure, hypothyroidism presented with increasing cough with greenish sputum along with dysphagia.  She was admitted for aspiration pneumonia and started on antibiotics.  Assessment & Plan:   Recurrent aspiration pneumonia in a patient with myotonic dystrophy Acute on chronic hypoxic respiratory failure (uses home oxygen 2 L/min only at night) -Currently still requiring 2 to 3 L of oxygen during daytime as well.  Wean off as able. -Currently on Rocephin and Flagyl. -Blood cultures negative so far  Severe sepsis: Present on admission Lactic acidosis -Antibiotics as above.  Off IV fluids.  Blood pressure on the lower side but stable.  Continue tube feeding. -Cultures as above.  Sepsis has resolved.  History of chronic systolic CHF: Resolved -Last TTE in the system at the time of her VF arrest admission showed EF of 20%.  Repeat echo showed EF of 55 to 60%. -Outpatient follow-up with cardiology.  Continue home digoxin.  Home metoprolol and spironolactone on hold because of soft blood pressure  Dysphagia associated with mitral and dystrophy Severe malnutrition -SLP following.  Recommending NPO. -Continue tube feedings and aspiration precautions.  Nutrition following.  High-grade heart block -Status post pacemaker.  Outpatient follow-up in EP  GERD -Continue PPI  Hypothyroidism -Continue levothyroxine  Mood disorder -Continue home Seroquel  Chronic pain -Continue home tramadol  Physical deconditioning -Patient has tolerated PT evaluation: PT recommends no PT follow-up  DVT prophylaxis: Lovenox Code Status:  Full Family Communication: None at bedside Disposition Plan: Status is: Inpatient Remains inpatient appropriate because: Of severity of illness.  Possible discharge in 1 to 2 days if remains stable clinically.    Consultants: None  Procedures: None  Antimicrobials:  Anti-infectives (From admission, onward)    Start     Dose/Rate Route Frequency Ordered Stop   05/22/23 1500  cefTRIAXone (ROCEPHIN) 2 g in sodium chloride 0.9 % 100 mL IVPB        2 g 200 mL/hr over 30 Minutes Intravenous Every 24 hours 05/22/23 1257     05/21/23 2300  vancomycin (VANCOREADY) IVPB 750 mg/150 mL  Status:  Discontinued        750 mg 150 mL/hr over 60 Minutes Intravenous Every 24 hours 05/21/23 0205 05/22/23 1257   05/21/23 1900  ceFEPIme (MAXIPIME) 2 g in sodium chloride 0.9 % 100 mL IVPB  Status:  Discontinued        2 g 200 mL/hr over 30 Minutes Intravenous Every 12 hours 05/21/23 1020 05/22/23 1257   05/21/23 1115  metroNIDAZOLE (FLAGYL) tablet 500 mg        500 mg Per Tube Every 12 hours 05/21/23 1020     05/21/23 0700  ceFEPIme (MAXIPIME) 2 g in sodium chloride 0.9 % 100 mL IVPB  Status:  Discontinued        2 g 200 mL/hr over 30 Minutes Intravenous Every 8 hours 05/21/23 0135 05/21/23 1020   05/21/23 0300  metroNIDAZOLE (FLAGYL) tablet 500 mg  Status:  Discontinued        500 mg Per Tube Every 8 hours 05/21/23 0135 05/21/23 1020   05/20/23 2330  vancomycin (VANCOCIN) IVPB 1000 mg/200 mL premix  1,000 mg 200 mL/hr over 60 Minutes Intravenous  Once 05/20/23 2320 05/21/23 0033   05/20/23 2315  ceFEPIme (MAXIPIME) 2 g in sodium chloride 0.9 % 100 mL IVPB        2 g 200 mL/hr over 30 Minutes Intravenous  Once 05/20/23 2308 05/20/23 2355        Subjective: Patient seen and examined at bedside.  No abdominal pain, fever, vomiting reported.  Still feels weak with intermittent cough. Objective: Vitals:   05/23/23 0900 05/23/23 1705 05/24/23 0405 05/24/23 0459  BP: 95/64 94/72 92/62     Pulse: 69 69 86   Resp: 16 18    Temp: 97.9 F (36.6 C) 98.1 F (36.7 C) 97.8 F (36.6 C)   TempSrc:   Oral   SpO2: 100% 100% 99%   Weight:    47.1 kg  Height:        Intake/Output Summary (Last 24 hours) at 05/24/2023 0753 Last data filed at 05/24/2023 0200 Gross per 24 hour  Intake 700 ml  Output 400 ml  Net 300 ml   Filed Weights   05/21/23 0619 05/22/23 0436 05/24/23 0459  Weight: 40 kg 43.1 kg 47.1 kg    Examination:  General: On 2 L oxygen via nasal cannula currently.  No distress.  Chronically ill and deconditioned looking. ENT/neck: No thyromegaly.  JVD is not elevated  respiratory: Decreased breath sounds at bases bilaterally with some crackles; no wheezing  CVS: S1-S2 heard, rate controlled currently Abdominal: Soft, nontender, slightly distended; no organomegaly, bowel sounds are heard.  Has PEG tube. Extremities: Trace lower extremity edema; no cyanosis  CNS: Sleepy, wakes up slightly.  Still slow to respond.   Lymph: No obvious lymphadenopathy Skin: No obvious ecchymosis/lesions  psych: Mostly flat affect.  Not agitated currently.  Musculoskeletal: No obvious joint swelling/deformity    Data Reviewed: I have personally reviewed following labs and imaging studies  CBC: Recent Labs  Lab 05/20/23 2256 05/21/23 0149 05/22/23 0556 05/23/23 0944 05/24/23 0456  WBC 15.5* 22.8* 6.2 4.3 5.3  NEUTROABS 12.9*  --  3.6 1.5* 2.9  HGB 13.5 10.6* 9.3* 10.1* 11.6*  HCT 43.9 33.9* 29.9* 31.2* 37.7  MCV 96.1 95.2 96.1 94.8 95.7  PLT 309 339 259 286 351   Basic Metabolic Panel: Recent Labs  Lab 05/20/23 2256 05/21/23 0149 05/22/23 0556 05/23/23 0944 05/24/23 0456  NA 138 140 141 138 142  K 3.6 4.1 3.8 3.2* 4.1  CL 97* 100 106 102 103  CO2 28 30 24 29 29   GLUCOSE 105* 132* 64* 86 85  BUN 7 5* <5* <5* <5*  CREATININE 0.51 0.51 0.50 0.43* 0.51  CALCIUM 9.4 8.7* 8.3* 8.5* 9.2  MG  --  1.8  --   --  2.0  PHOS  --  1.9* 2.7  --   --    GFR: Estimated  Creatinine Clearance: 60.5 mL/min (by C-G formula based on SCr of 0.51 mg/dL). Liver Function Tests: Recent Labs  Lab 05/20/23 2256  AST 23  ALT 13  ALKPHOS 73  BILITOT 0.7  PROT 8.7*  ALBUMIN 3.0*   Recent Labs  Lab 05/20/23 2255  LIPASE 21   No results for input(s): "AMMONIA" in the last 168 hours. Coagulation Profile: Recent Labs  Lab 05/20/23 2256  INR 1.3*   Cardiac Enzymes: No results for input(s): "CKTOTAL", "CKMB", "CKMBINDEX", "TROPONINI" in the last 168 hours. BNP (last 3 results) No results for input(s): "PROBNP" in the last 8760 hours. HbA1C: No  results for input(s): "HGBA1C" in the last 72 hours. CBG: Recent Labs  Lab 05/23/23 1625 05/23/23 2013 05/23/23 2354 05/24/23 0402 05/24/23 0724  GLUCAP 86 95 101* 108* 107*   Lipid Profile: No results for input(s): "CHOL", "HDL", "LDLCALC", "TRIG", "CHOLHDL", "LDLDIRECT" in the last 72 hours. Thyroid Function Tests: No results for input(s): "TSH", "T4TOTAL", "FREET4", "T3FREE", "THYROIDAB" in the last 72 hours. Anemia Panel: No results for input(s): "VITAMINB12", "FOLATE", "FERRITIN", "TIBC", "IRON", "RETICCTPCT" in the last 72 hours. Sepsis Labs: Recent Labs  Lab 05/20/23 2256 05/21/23 0149 05/21/23 0501  LATICACIDVEN 3.1* 2.0* 1.2    Recent Results (from the past 240 hour(s))  Blood culture (routine x 2)     Status: None (Preliminary result)   Collection Time: 05/20/23 10:56 PM   Specimen: BLOOD LEFT FOREARM  Result Value Ref Range Status   Specimen Description BLOOD LEFT FOREARM  Final   Special Requests   Final    BOTTLES DRAWN AEROBIC AND ANAEROBIC Blood Culture adequate volume   Culture   Final    NO GROWTH 3 DAYS Performed at [facility], [address], [place], [place] [address]    Report Status PENDING  Incomplete  Blood culture (routine x 2)     Status: None (Preliminary result)   Collection Time: 05/20/23 10:56 PM   Specimen: BLOOD  Result Value Ref Range Status   Specimen  Description BLOOD SITE NOT SPECIFIED  Final   Special Requests AEROBIC BOTTLE ONLY Blood Culture adequate volume  Final   Culture   Final    NO GROWTH 3 DAYS Performed at [facility], [address] [place], [place] [address]    Report Status PENDING  Incomplete  SARS Coronavirus 2 by RT PCR (hospital order, performed in [facility] hospital lab) *cepheid single result test* Anterior Nasal Swab     Status: None   Collection Time: 05/20/23 11:30 PM   Specimen: Anterior Nasal Swab  Result Value Ref Range Status   SARS Coronavirus 2 by RT PCR NEGATIVE NEGATIVE Final    Comment: Performed at [facility], [address] [place], [place] [address]  MRSA Next Gen by PCR, Nasal     Status: Abnormal   Collection Time: 05/21/23  3:09 AM   Specimen: Nasal Mucosa; Nasal Swab  Result Value Ref Range Status   MRSA by PCR Next Gen DETECTED (A) NOT DETECTED Final    Comment: RESULT CALLED TO, READ BACK BY AND VERIFIED WITH: MOON,K RN AT [phone] 05/21/2023 MITCHELL,L (NOTE) The GeneXpert MRSA Assay (FDA approved for NASAL specimens only), is one component of a comprehensive MRSA colonization surveillance program. It is not intended to diagnose MRSA infection nor to guide or monitor treatment for MRSA infections. Test performance is not FDA approved in patients less than 71 years old. Performed at [facility], [address] [place], [place] [address]          Radiology Studies: DG Swallowing Func-Speech Pathology  Result Date: 05/22/2023 Table formatting from the original result was not included. Modified Barium Swallow Study Patient Details Name: AKERA BENA MRN: [id number] Date of Birth: Apr 30, 1971 Today's Date: 05/22/2023 HPI/PMH: HPI: YURIE JECK is a 52 yo female presenting to ED 9/16 with generalized pain and new onset of productive cough with green sputum. PEG in place since April 2024, although per note, pt states that she attains most of her nutrition by  mouth and eats "regular food". CXR with new  R mid lung opacity consistent with acute infiltrate. MBS 01/25/23 with severe dysphagia characterized by minimal movement of POs through the pharynx, requiring multiple subswallows and resulting in almost total stasis of purees which required oral suctioning to clear. At that time, airway protection was surprisingly effective with no aspiration noted. SLP recommended NPO with small sips of water/ice chips per pt's preference. PMH includes myotonic dystrophy, c/b heart block, PPM, PNA and aspiration event resulting in VF arrest and requiring trach and PEG, HFrEF, total thyroidectomy, hypothyroidism, COPD requiring supplemental O2 at night Clinical Impression: Clinical Impression: Pt presents with a severe pharyngeal dysphagia characterized primarily by reduced strength, resulting in almonst complete retention of all consistencies administered. Pt achieves little to no base of tongue retraction resulting in incomplete oral clearance and residue present along the BOT and valleculae. Pt has minimal UES opening, resulting in almost complete retention of barium consistencies in the pharynx. She also has diffuse residue present throughout the pharynx which is only intermittently able to be cleared with the use of cued effortful swallow and multiple swallows. She is unable to achieve epiglottic inversion allowing airway invasion. Pt aspirated trials of thin liquids, although demonstrated a strong cough response (PAS 7). She was intermittently able to clear aspirates from the trachea. A chin tuck was effective in preventing aspiration, although ineffective at reducing amount of residue which is suspected to contribute to aspiration when the laryngeal vestibule remains open at rest. Nectar thick liquids and purees did not result in any further penetration or aspiration, although note almost total stasis of these thicker consistencies. Pt initiating use of multiple swallows  independently, although noted to be minimally effective as she lacks the strength to successfully clear pharyngeal residue. Additionally, an effortful swallow was ineffective as baseline swallowing appears to require moderate effort already. Esophageal retention noted during fluoro sweep as well. Recommend continuing NPO, although with adherence to the Free Water Protocol only after thorough oral care. Will continue to follow acutely to reinforce aspiration precautions. She will continue to require intensive SLP intervention following d/c. Factors that may increase risk of adverse event in presence of aspiration Rubye Oaks & Clearance Coots 2021): Factors that may increase risk of adverse event in presence of aspiration Rubye Oaks & Clearance Coots 2021): Limited mobility Recommendations/Plan: Swallowing Evaluation Recommendations Swallowing Evaluation Recommendations Recommendations: NPO; Alternative means of nutrition - G Tube; Free water protocol after oral care Medication Administration: Via alternative means Treatment Plan Treatment Plan Treatment recommendations: Therapy as outlined in treatment plan below Follow-up recommendations: Home health SLP Functional status assessment: Patient has had a recent decline in their functional status and demonstrates the ability to make significant improvements in function in a reasonable and predictable amount of time. Treatment frequency: Min 2x/week Treatment duration: 2 weeks Interventions: Aspiration precaution training; Compensatory techniques; Patient/family education Recommendations Recommendations for follow up therapy are one component of a multi-disciplinary discharge planning process, led by the attending physician.  Recommendations may be updated based on patient status, additional functional criteria and insurance authorization. Assessment: Orofacial Exam: Orofacial Exam Oral Cavity: Oral Hygiene: WFL Oral Cavity - Dentition: Adequate natural dentition Orofacial Anatomy: WFL Oral  Motor/Sensory Function: WFL Anatomy: Anatomy: WFL Boluses Administered: Boluses Administered Boluses Administered: Thin liquids (Level 0); Mildly thick liquids (Level 2, nectar thick); Puree  Oral Impairment Domain: Oral Impairment Domain Lip Closure: No labial escape Tongue control during bolus hold: Cohesive bolus between tongue to palatal seal Bolus transport/lingual motion: Brisk tongue motion Oral residue: Trace residue lining oral structures Location of oral  residue : Tongue; Palate Initiation of pharyngeal swallow : Pyriform sinuses  Pharyngeal Impairment Domain: Pharyngeal Impairment Domain Soft palate elevation: No bolus between soft palate (SP)/pharyngeal wall (PW) Laryngeal elevation: Complete superior movement of thyroid cartilage with complete approximation of arytenoids to epiglottic petiole Anterior hyoid excursion: Complete anterior movement Epiglottic movement: No inversion Laryngeal vestibule closure: Incomplete, narrow column air/contrast in laryngeal vestibule Pharyngeal stripping wave : Present - diminished Pharyngeal contraction (A/P view only): N/A Pharyngoesophageal segment opening: Minimal distention/minimal duration, marked obstruction of flow Tongue base retraction: Wide column of contrast or air between tongue base and PPW Pharyngeal residue: Minimal to no pharyngeal clearance Location of pharyngeal residue: Tongue base; Valleculae; Pharyngeal wall; Pyriform sinuses; Diffuse (>3 areas)  Esophageal Impairment Domain: Esophageal Impairment Domain Esophageal clearance upright position: Esophageal retention Pill: No data recorded Penetration/Aspiration Scale Score: Penetration/Aspiration Scale Score 1.  Material does not enter airway: Mildly thick liquids (Level 2, nectar thick); Puree 7.  Material enters airway, passes BELOW cords and not ejected out despite cough attempt by patient: Thin liquids (Level 0) Compensatory Strategies: Compensatory Strategies Compensatory strategies: Yes Straw:  Effective; Ineffective Effective Straw: Mildly thick liquid (Level 2, nectar thick) Ineffective Straw: Thin liquid (Level 0) Effortful swallow: Ineffective Ineffective Effortful Swallow: Thin liquid (Level 0); Mildly thick liquid (Level 2, nectar thick); Puree Multiple swallows: Ineffective Ineffective Multiple Swallows: Thin liquid (Level 0); Mildly thick liquid (Level 2, nectar thick); Puree Chin tuck: Ineffective Ineffective Chin Tuck: Thin liquid (Level 0)   General Information: Caregiver present: No  Diet Prior to this Study: NPO; G-tube   Temperature : Normal   Respiratory Status: WFL   Supplemental O2: Nasal cannula   History of Recent Intubation: No  Behavior/Cognition: Alert; Cooperative; Pleasant mood Self-Feeding Abilities: Able to self-feed Baseline vocal quality/speech: Hypophonia/low volume Volitional Cough: Able to elicit Volitional Swallow: Able to elicit Exam Limitations: No limitations Goal Planning: Prognosis for improved oropharyngeal function: Good Barriers to Reach Goals: Time post onset; Severity of deficits No data recorded Patient/Family Stated Goal: none stated Consulted and agree with results and recommendations: Patient Pain: Pain Assessment Pain Assessment: No/denies pain End of Session: Start Time:SLP Start Time (ACUTE ONLY): 1225 Stop Time: SLP Stop Time (ACUTE ONLY): 1245 Time Calculation:SLP Time Calculation (min) (ACUTE ONLY): 20 min Charges: SLP Evaluations $ SLP Speech Visit: 1 Visit SLP Evaluations $BSS Swallow: 1 Procedure $MBS Swallow: 1 Procedure $Swallowing Treatment: 1 Procedure SLP visit diagnosis: SLP Visit Diagnosis: Dysphagia, pharyngoesophageal phase (R13.14) Past Medical History: Past Medical History: Diagnosis Date  Bifascicular block 10/02/2014  Cataract   Dyspnea   Excess ear wax   Multinodular goiter   Pneumonia   Presence of permanent cardiac pacemaker   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  Sickle cell trait (HCC)    Watery eyes   Left  Weakness of both legs  Past Surgical History: Past Surgical History: Procedure Laterality Date  BREAST BIOPSY Right   EYE SURGERY Left 2017  "related to blockage in my nose"  EYE SURGERY Right 05/2019  INSERT / REPLACE / REMOVE PACEMAKER  02/15/2017  IR GASTROSTOMY TUBE MOD SED  01/01/2023  PACEMAKER IMPLANT N/A 02/15/2017  Procedure: Pacemaker Implant;  Surgeon: Duke Salvia, MD;  Location: [facility] INVASIVE CV LAB;  Service: Cardiovascular;  Laterality: N/A;  RIGHT/LEFT HEART CATH AND CORONARY ANGIOGRAPHY N/A 11/16/2022  Procedure: RIGHT/LEFT HEART CATH AND CORONARY ANGIOGRAPHY;  Surgeon: Laurey Morale, MD;  Location: [facility] INVASIVE CV LAB;  Service: Cardiovascular;  Laterality:  N/A;  THYROIDECTOMY N/A 04/21/2021  Procedure: TOTAL THYROIDECTOMY;  Surgeon: Axel Filler, MD;  Location: [facility] OR;  Service: General;  Laterality: N/A; Gwynneth Aliment, M.A., CF-SLP Speech Language Pathology, Acute Rehabilitation Services Secure Chat preferred [phone] 05/22/2023, 2:02 PM       Scheduled Meds:  Chlorhexidine Gluconate Cloth  6 each Topical Q0600   digoxin  250 mcg Per Tube Daily   enoxaparin (LOVENOX) injection  30 mg Subcutaneous Q24H   free water  120 mL Per Tube Q4H   levothyroxine  112 mcg Per Tube QAC breakfast   metroNIDAZOLE  500 mg Per Tube Q12H   mupirocin ointment  1 Application Nasal BID   potassium chloride  40 mEq Per Tube BID   QUEtiapine  25 mg Per Tube QHS   sodium chloride flush  3 mL Intravenous Q12H   Continuous Infusions:  cefTRIAXone (ROCEPHIN)  IV 2 g (05/23/23 1421)   feeding supplement (JEVITY 1.5 CAL/FIBER) 1,000 mL (05/22/23 1458)          Glade Lloyd, MD [facility] 05/24/2023, 7:53 AM

## 2023-05-24 NOTE — Patient Outreach (Addendum)
Care Coordination   Follow Up Visit Note   05/24/2023 Name: Cynthia Underwood MRN: [id number] DOB: [birth year]  Cynthia Underwood is a 52 y.o. year old female who sees Hoy Register, MD for primary care. I spoke with sister Cynthia Underwood by phone today.  What matters to the patients health and wellness today?  Sister Cynthia Underwood is requesting assistance with obtaining a portable O2 tank for patient as well as a feeding pump.     Goals Addressed             This Visit's Progress    RN Care Coordination Activities: further follow up needed       Care Coordination Interventions: Received an inbound call from sister Cynthia Underwood advising patient has been readmitted for aspiration pneumonia Determined sister Cynthia Underwood is reaching out to ensure patient receive a feeding pump as continuous feedings are needed moving forward, patient's current supplier is [employer], per review of hospital note, nutrition is following Discussed patient did not receive the correct formula following her last hospitalization, she lost about 10 lbs Determined patient also is in need of a portable Oxygen tank for traveling to short distances, her current supplier is [employer] and she has a Insurance underwriter at home  Sent secure chat to [employer] CM and Ricardo Jericho RD, regarding patient/family concerns to be addressed prior to patient's discharge home     Interventions Today    Flowsheet Row Most Recent Value  Chronic Disease   Chronic disease during today's visit Other  [aspirational pneumonia]  General Interventions   General Interventions Discussed/Reviewed General Interventions Discussed, General Interventions Reviewed, Durable Medical Equipment (DME), Communication with  Doctor Visits Discussed/Reviewed Doctor Visits Discussed, Doctor Visits Reviewed, Specialist  Durable Medical Equipment (DME) Oxygen  Communication with Social Work  [facility] CM,  Ricardo Jericho RD]  Education Interventions    Education Provided Provided Education  Provided Verbal Education On Nutrition  Nutrition Interventions   Nutrition Discussed/Reviewed Nutrition Discussed, Nutrition Reviewed, Supplemental nutrition  [continuous feedings]            SDOH assessments and interventions completed:  No     Care Coordination Interventions:  Yes, provided   Follow up plan: Follow up call scheduled for 06/01/23 @2 :30 PM     Encounter Outcome:  Patient Visit Completed

## 2023-05-24 NOTE — Progress Notes (Signed)
Jevity increased to 40 ml/hr

## 2023-05-24 NOTE — Care Management Important Message (Signed)
Important Message  Patient Details  Name: Cynthia Underwood MRN: [id number] Date of Birth: [birth year]   Medicare Important Message Given:  Yes     Evagelia Knack Stefan Church 05/24/2023, 3:44 PM

## 2023-05-25 ENCOUNTER — Other Ambulatory Visit (HOSPITAL_COMMUNITY): Payer: Self-pay

## 2023-05-25 ENCOUNTER — Telehealth: Payer: Self-pay

## 2023-05-25 DIAGNOSIS — J690 Pneumonitis due to inhalation of food and vomit: Secondary | ICD-10-CM | POA: Diagnosis not present

## 2023-05-25 LAB — CULTURE, BLOOD (ROUTINE X 2)
Culture: NO GROWTH
Culture: NO GROWTH
Special Requests: ADEQUATE
Special Requests: ADEQUATE

## 2023-05-25 LAB — GLUCOSE, CAPILLARY
Glucose-Capillary: 103 mg/dL — ABNORMAL HIGH (ref 70–99)
Glucose-Capillary: 113 mg/dL — ABNORMAL HIGH (ref 70–99)
Glucose-Capillary: 86 mg/dL (ref 70–99)
Glucose-Capillary: 93 mg/dL (ref 70–99)

## 2023-05-25 MED ORDER — JEVITY 1.5 CAL/FIBER PO LIQD: 237.0000 mL | Freq: Four times a day (QID) | ORAL | 30 refills | Status: DC

## 2023-05-25 MED ORDER — FREE WATER: Status: AC

## 2023-05-25 MED ORDER — CEFUROXIME AXETIL 500 MG PO TABS: 500.0000 mg | ORAL_TABLET | Freq: Two times a day (BID) | ORAL | 0 refills | Status: AC

## 2023-05-25 MED ORDER — JEVITY 1.5 CAL/FIBER PO LIQD: 1000.0000 mL | ORAL | 30 refills | Status: DC

## 2023-05-25 MED ORDER — JEVITY 1.5 CAL/FIBER PO LIQD
237.0000 mL | Freq: Four times a day (QID) | ORAL | Status: DC
  Administered 2023-05-25: 237 mL
  Filled 2023-05-25 (×3): qty 237

## 2023-05-25 NOTE — Progress Notes (Signed)
Speech Language Pathology Treatment: Dysphagia  Patient Details Name: Cynthia Underwood MRN: [id number] DOB: [birth year] Today's Date: 05/25/2023 Time: 4782-9562 SLP Time Calculation (min) (ACUTE ONLY): 11 min  Assessment / Plan / Recommendation Clinical Impression  Pt seen for swallow intervention. She is NPO but is on the free water protocol where she is allowed to have small sips of only water preceded by oral care. Pt brushed her teeth and tongue independently. She needed cues to recall to tuck her chin then perform a volitional cough. She was able to perform with supervision and there was one delayed throat clear that appeared reflexive which is to be expected with her level of dysphagia. Pt stated she will drink water but "doesn't really like it but likes ice." Reviewed with her that she may only have ice or sips water by mouth after oral care with chin tuck and cough after swallow with clinical reasoning and she verbalized understanding. Pt is to be discharged today and it is recommended that she continue to have follow up ST- MD's note states outpatient.    HPI HPI: Cynthia Underwood is a 52 yo female presenting to ED 9/16 with generalized pain and new onset of productive cough with green sputum. PEG in place since April 2024, although per note, pt states that she attains most of her nutrition by mouth and eats "regular food". CXR with new R mid lung opacity consistent with acute infiltrate. MBS 01/25/23 with severe dysphagia characterized by minimal movement of POs through the pharynx, requiring multiple subswallows and resulting in almost total stasis of purees which required oral suctioning to clear. At that time, airway protection was surprisingly effective with no aspiration noted. SLP recommended NPO with small sips of water/ice chips per pt's preference. PMH includes myotonic dystrophy, c/b heart block, PPM, PNA and aspiration event resulting in VF arrest and requiring trach and PEG, HFrEF,  total thyroidectomy, hypothyroidism, COPD requiring supplemental O2 at night      SLP Plan  Other (Comment) (pt being discharged today)      Recommendations for follow up therapy are one component of a multi-disciplinary discharge planning process, led by the attending physician.  Recommendations may be updated based on patient status, additional functional criteria and insurance authorization.    Recommendations  Diet recommendations: NPO;Other(comment) (free water protocol) Liquids provided via: Cup;Straw Medication Administration: Via alternative means Supervision: Patient able to self feed Compensations: Slow rate;Small sips/bites;Chin tuck Postural Changes and/or Swallow Maneuvers: Seated upright 90 degrees                  Oral care QID   Intermittent Supervision/Assistance Dysphagia, pharyngoesophageal phase (R13.14)     Other (Comment) (pt being discharged today)     Royce Macadamia  05/25/2023, 11:31 AM

## 2023-05-25 NOTE — TOC Transition Note (Addendum)
Transition of Care [facility]) - CM/SW Discharge Note   Patient Details  Name: Cynthia Underwood MRN: [id number] Date of Birth: 01-Jun-1971  Transition of Care [facility]) CM/SW Contact:  Tom-Johnson, Hershal Coria, RN Phone Number: 05/25/2023, 12:15 PM   Clinical Narrative:     Patient is scheduled for discharge today.  Readmission Risk Assessment done. Outpatient f/u, hospital f/u and discharge instructions on AVS. Patient with Chronic Peg Tube, receives supplies from [employer], new order placed and [place] notified.  TOC Pharmacy to deliver two days worth of Feeding until [employer] could deliver at home.  Patient has )2 Concentrator at home, [employer] delivered Portable tank at bedside for patient to discharge home with.  Patient requesting Portable tank to use outpatient, POC eval order placed and [employer] will contact patient for further eval.   Blanch Media to transport at discharge.  No further TOC needs noted.                 Final next level of care: Home/Self Care Barriers to Discharge: Family Issues   Patient Goals and CMS Choice CMS Medicare.gov Compare Post Acute Care list provided to:: Patient Choice offered to / list presented to : Patient  Discharge Placement                  Patient to be transferred to facility by: Cousin Name of family member notified: [facility] and Services Additional resources added to the After Visit Summary for                  DME Arranged: Oxygen DME Agency: [employer] Date DME Agency Contacted: 05/25/23 Time DME Agency Contacted: 1130 Representative spoke with at DME Agency: Earna Coder HH Arranged: NA HH Agency: NA        Social Determinants of Health (SDOH) Interventions SDOH Screenings   Food Insecurity: Food Insecurity Present (05/21/2023)  Housing: Low Risk  (05/21/2023)  Transportation Needs: No Transportation Needs (05/21/2023)  Utilities: Not At Risk (05/21/2023)  Alcohol Screen: Low Risk  (10/16/2022)   Depression (PHQ2-9): Medium Risk (05/16/2023)  Financial Resource Strain: Low Risk  (10/16/2022)  Physical Activity: Inactive (10/16/2022)  Social Connections: Socially Isolated (10/16/2022)  Stress: Stress Concern Present (10/16/2022)  Tobacco Use: Medium Risk (05/20/2023)     Readmission Risk Interventions    05/21/2023    4:01 PM  Readmission Risk Prevention Plan  Transportation Screening Complete  PCP or Specialist Appt within 3-5 Days Complete  HRI or Home Care Consult Complete  Social Work Consult for Recovery Care Planning/Counseling Complete  Palliative Care Screening Not Applicable  Medication Review Oceanographer) Referral to Pharmacy

## 2023-05-25 NOTE — Telephone Encounter (Unsigned)
Copied from CRM [id number]. Topic: General - Other >> May 25, 2023 11:22 AM Clide Dales wrote: Patients called with questions about getting a portable oxygen machine. Please advise.

## 2023-05-25 NOTE — Progress Notes (Cosign Needed)
    Durable Medical Equipment  (From admission, onward)           Start     Ordered   05/25/23 1205  For home use only DME Other see comment  Once       Comments: POC Assessment for Portable Oxygen Concentrator  Question:  Length of Need  Answer:  12 Months   05/25/23 1207   05/25/23 1147  For home use only DME oxygen  Once       Question Answer Comment  Length of Need Lifetime   Mode or (Route) Nasal cannula   Liters per Minute 2   Frequency Continuous (stationary and portable oxygen unit needed)   Oxygen conserving device Yes   Oxygen delivery system Gas      05/25/23 1147

## 2023-05-25 NOTE — Progress Notes (Signed)
SATURATION QUALIFICATIONS:   Patient Saturations on Room Air at Rest = 98 Patient Saturations on [employer] while Ambulating = 84 Patient Saturations on 2 Liters of oxygen while Ambulating = 95  Please briefly explain why patient needs home oxygen: Pt was little SOB with only few steps walking.

## 2023-05-25 NOTE — Consult Note (Signed)
Value-Based Care Institute  [facility] Inpatient Consult   05/25/2023  Cynthia Underwood 18-Dec-1970 [id number]  [facility]  Accountable Care Organization [ACO] Patient: [name] Medicare  Primary Care Provider:  Hoy Register, MD   Patient showing as active with [facility] [[facility] Care Management for care coordination services.  Patient has been engaged by a [employer] per electronic medical record.  The community based plan of care has focused on disease management and community resource support.    Came to unit to speak with patient however, patient is currently not in room and is showing as discharged.  Plan: Follow up with Community RN regarding patient transitioned home. Patient has follow up appointments noted.   Of note, [facility] Care Management services does not replace or interfere with any services that are needed or arranged by inpatient [facility] care management team.   Charlesetta Shanks, RN, BSN, CCM [place]  [facility], [facility] Liaison Direct Dial: [phone] or secure chat Website: [email or web address]

## 2023-05-25 NOTE — Progress Notes (Signed)
DISCHARGE NOTE HOME Elder Love Landini to be discharged Home per MD order. Discussed prescriptions and follow up appointments with the patient. Prescriptions given to patient; medication list explained in detail. Patient verbalized understanding.  Skin clean, dry and intact without evidence of skin break down, no evidence of skin tears noted. IV catheter discontinued intact. Site without signs and symptoms of complications. Dressing and pressure applied. Pt denies pain at the site currently. No complaints noted.  Patient free of lines, drains, and wounds. PEG tube C/D/I  An After Visit Summary (AVS) was printed and given to the patient. Patient escorted via wheelchair, and discharged home via private auto.  Margarita Grizzle, RN

## 2023-05-25 NOTE — Discharge Summary (Addendum)
[facility] INVASIVE CV LAB;  Service: Cardiovascular;  Laterality: N/A;  RIGHT/LEFT HEART CATH AND CORONARY ANGIOGRAPHY N/A 11/16/2022  Procedure:  RIGHT/LEFT HEART CATH AND CORONARY ANGIOGRAPHY;  Surgeon: Laurey Morale, MD;  Location: [facility] INVASIVE CV LAB;  Service: Cardiovascular;  Laterality: N/A;  THYROIDECTOMY N/A 04/21/2021  Procedure: TOTAL THYROIDECTOMY;  Surgeon: Axel Filler, MD;  Location: [facility] OR;  Service: General;  Laterality: N/A; Gwynneth Aliment, M.A., CF-SLP Speech Language Pathology, Acute Rehabilitation Services Secure Chat preferred [phone] 05/22/2023, 2:02 PM  ECHOCARDIOGRAM COMPLETE  Result Date: 05/21/2023    ECHOCARDIOGRAM REPORT   Patient Name:   Cynthia Underwood Date of Exam: 05/21/2023 Medical Rec #:  [id number]         Height:       63.0 in Accession #:    [id number]        Weight:       88.1 lb Date of Birth:  November 06, 1970        BSA:          1.365 m Patient Age:    51 years          BP:           86/56 mmHg Patient Gender: F                 HR:           71 bpm. Exam Location:  Inpatient Procedure: 2D Echo, Cardiac Doppler and Color Doppler Indications:    Congestive Heart Failure I50.9  History:        Patient has prior history of Echocardiogram examinations, most                 recent 11/15/2022. CHF, Pacemaker, Arrythmias:RBBB; Risk                 Factors:Former Smoker.  Sonographer:    Aron Baba Referring Phys: [id number] [facility]  Sonographer Comments: Suboptimal subcostal window. Image acquisition challenging due to respiratory motion and Image acquisition challenging due to patient body habitus. IMPRESSIONS  1. Left ventricular ejection fraction, by estimation, is 55 to 60%. The left ventricle has normal function. The left ventricle has no regional wall motion abnormalities. Left ventricular diastolic parameters were normal.  2. Right ventricular systolic function is normal. The right ventricular size is normal. There is normal pulmonary artery systolic pressure.  3. A small pericardial effusion is present.  4. The mitral valve is normal in structure. No evidence of mitral valve regurgitation. No evidence of  mitral stenosis.  5. The aortic valve is tricuspid. Aortic valve regurgitation is not visualized. No aortic stenosis is present.  6. The inferior vena cava is normal in size with greater than 50% respiratory variability, suggesting right atrial pressure of 3 mmHg. FINDINGS  Left Ventricle: Left ventricular ejection fraction, by estimation, is 55 to 60%. The left ventricle has normal function. The left ventricle has no regional wall motion abnormalities. The left ventricular internal cavity size was normal in size. There is  no left ventricular hypertrophy. Left ventricular diastolic parameters were normal. Right Ventricle: The right ventricular size is normal. Right ventricular systolic function is normal. There is normal pulmonary artery systolic pressure. The tricuspid regurgitant velocity is 2.36 m/s, and with an assumed right atrial pressure of 3 mmHg,  the estimated right ventricular systolic pressure is 25.3 mmHg. Left Atrium: Left atrial size was normal in size. Right Atrium: Right atrial size was normal in size. Pericardium: A small pericardial effusion is  Physician Discharge Summary  Cynthia Underwood YQM:[id number] DOB: 1970-11-11 DOA: 05/20/2023  PCP: Hoy Register, MD  Admit date: 05/20/2023 Discharge date: 05/25/2023  Admitted From: Home Disposition: Home  Recommendations for Outpatient Follow-up:  Follow up with PCP in 1 week with repeat CBC/BMP Follow up in ED if symptoms worsen or new appear   Home Health: No Equipment/Devices: None  Discharge Condition: Guarded CODE STATUS: Full Diet recommendation: N.p.o. by mouth.  Continue tube feeding as per dietary recommendations  Brief/Interim Summary: 52 y.o. female with hx of myotonic dystrophy, complicated by heart block requiring PPM, pneumonia and aspiration event resulting in VF arrest requiring trach and PEG, remains with PEG tube and trach has been taken down, chronic systolic heart failure, hypothyroidism presented with increasing cough with greenish sputum along with dysphagia.  She was admitted for aspiration pneumonia and started on antibiotics.  During the hospitalization, her condition is improved.  She has received 5 days of IV antibiotics.  She is currently on room air.  SLP recommended strict NPO.  She is tolerating tube feeding.  She feels okay to go home today.  She will be discharged home today on 2 more days of Ceftin.    Discharge Diagnoses:   Recurrent aspiration pneumonia in a patient with myotonic dystrophy Acute on chronic hypoxic respiratory failure (uses home oxygen 2 L/min only at night) -Respiratory status has improved.  Currently on room air during daytime. -Currently on Rocephin and Flagyl. -Blood cultures negative so far -She feels okay to go home today.  She will be discharged home today on 2 more days of Ceftin.     Severe sepsis: Present on admission Lactic acidosis -Antibiotics as above.  Off IV fluids.  Blood pressure on the lower side but stable.  Continue tube feeding. -Cultures as above.  Sepsis has resolved.   History of chronic systolic  CHF: Resolved -Last TTE in the system at the time of her VF arrest admission showed EF of 20%.  Repeat echo showed EF of 55 to 60%. -Outpatient follow-up with cardiology.  Continue home digoxin.  Home metoprolol and spironolactone on hold because of soft blood pressure   Dysphagia associated with mitral and dystrophy Severe malnutrition -SLP following.  Recommending NPO.  Outpatient follow-up with SLP -Continue tube feedings as per nutrition recommendations.  Outpatient follow-up.  High-grade heart block -Status post pacemaker.  Outpatient follow-up with EP   GERD -Continue PPI   Hypothyroidism -Continue levothyroxine   Mood disorder -Continue home Seroquel   Physical deconditioning -Patient has tolerated PT evaluation: PT recommends no PT follow-up   Discharge Instructions  Discharge Instructions     Increase activity slowly   Complete by: As directed       Allergies as of 05/25/2023       Reactions   Penicillin G Nausea And Vomiting, Rash, Other (See Comments)   Sore throat Has had cephalosporins in past   Penicillins Nausea And Vomiting, Rash, Other (See Comments)   Fever Has patient had a PCN reaction causing immediate rash, facial/tongue/throat swelling, SOB or lightheadedness with hypotension: Yes Has patient had a PCN reaction causing severe rash involving mucus membranes or skin necrosis: Unknown Has patient had a PCN reaction that required hospitalization: No Has patient had a PCN reaction occurring within the last 10 years: No If all of the above answers are "NO", then may proceed with Cephalosporin use.        Medication List     STOP taking these medications  [facility] IN[facility];  Service: Cardiovascular;  Laterality: N/A;  RIGHT/LEFT HEART CATH AND CORONARY ANGIOGRAPHY N/A 11/16/2022  Procedure:  RIGHT/LEFT HEART CATH AND CORONARY ANGIOGRAPHY;  Surgeon: Laurey Morale, MD;  Location: [facility] INVASIVE CV LAB;  Service: Cardiovascular;  Laterality: N/A;  THYROIDECTOMY N/A 04/21/2021  Procedure: TOTAL THYROIDECTOMY;  Surgeon: Axel Filler, MD;  Location: [facility] OR;  Service: General;  Laterality: N/A; Gwynneth Aliment, M.A., CF-SLP Speech Language Pathology, Acute Rehabilitation Services Secure Chat preferred [phone] 05/22/2023, 2:02 PM  ECHOCARDIOGRAM COMPLETE  Result Date: 05/21/2023    ECHOCARDIOGRAM REPORT   Patient Name:   Cynthia Underwood Date of Exam: 05/21/2023 Medical Rec #:  [id number]         Height:       63.0 in Accession #:    [id number]        Weight:       88.1 lb Date of Birth:  November 06, 1970        BSA:          1.365 m Patient Age:    51 years          BP:           86/56 mmHg Patient Gender: F                 HR:           71 bpm. Exam Location:  Inpatient Procedure: 2D Echo, Cardiac Doppler and Color Doppler Indications:    Congestive Heart Failure I50.9  History:        Patient has prior history of Echocardiogram examinations, most                 recent 11/15/2022. CHF, Pacemaker, Arrythmias:RBBB; Risk                 Factors:Former Smoker.  Sonographer:    Aron Baba Referring Phys: [id number] [facility]  Sonographer Comments: Suboptimal subcostal window. Image acquisition challenging due to respiratory motion and Image acquisition challenging due to patient body habitus. IMPRESSIONS  1. Left ventricular ejection fraction, by estimation, is 55 to 60%. The left ventricle has normal function. The left ventricle has no regional wall motion abnormalities. Left ventricular diastolic parameters were normal.  2. Right ventricular systolic function is normal. The right ventricular size is normal. There is normal pulmonary artery systolic pressure.  3. A small pericardial effusion is present.  4. The mitral valve is normal in structure. No evidence of mitral valve regurgitation. No evidence of  mitral stenosis.  5. The aortic valve is tricuspid. Aortic valve regurgitation is not visualized. No aortic stenosis is present.  6. The inferior vena cava is normal in size with greater than 50% respiratory variability, suggesting right atrial pressure of 3 mmHg. FINDINGS  Left Ventricle: Left ventricular ejection fraction, by estimation, is 55 to 60%. The left ventricle has normal function. The left ventricle has no regional wall motion abnormalities. The left ventricular internal cavity size was normal in size. There is  no left ventricular hypertrophy. Left ventricular diastolic parameters were normal. Right Ventricle: The right ventricular size is normal. Right ventricular systolic function is normal. There is normal pulmonary artery systolic pressure. The tricuspid regurgitant velocity is 2.36 m/s, and with an assumed right atrial pressure of 3 mmHg,  the estimated right ventricular systolic pressure is 25.3 mmHg. Left Atrium: Left atrial size was normal in size. Right Atrium: Right atrial size was normal in size. Pericardium: A small pericardial effusion is  Physician Discharge Summary  Cynthia Underwood YQM:[id number] DOB: 1970-11-11 DOA: 05/20/2023  PCP: Hoy Register, MD  Admit date: 05/20/2023 Discharge date: 05/25/2023  Admitted From: Home Disposition: Home  Recommendations for Outpatient Follow-up:  Follow up with PCP in 1 week with repeat CBC/BMP Follow up in ED if symptoms worsen or new appear   Home Health: No Equipment/Devices: None  Discharge Condition: Guarded CODE STATUS: Full Diet recommendation: N.p.o. by mouth.  Continue tube feeding as per dietary recommendations  Brief/Interim Summary: 52 y.o. female with hx of myotonic dystrophy, complicated by heart block requiring PPM, pneumonia and aspiration event resulting in VF arrest requiring trach and PEG, remains with PEG tube and trach has been taken down, chronic systolic heart failure, hypothyroidism presented with increasing cough with greenish sputum along with dysphagia.  She was admitted for aspiration pneumonia and started on antibiotics.  During the hospitalization, her condition is improved.  She has received 5 days of IV antibiotics.  She is currently on room air.  SLP recommended strict NPO.  She is tolerating tube feeding.  She feels okay to go home today.  She will be discharged home today on 2 more days of Ceftin.    Discharge Diagnoses:   Recurrent aspiration pneumonia in a patient with myotonic dystrophy Acute on chronic hypoxic respiratory failure (uses home oxygen 2 L/min only at night) -Respiratory status has improved.  Currently on room air during daytime. -Currently on Rocephin and Flagyl. -Blood cultures negative so far -She feels okay to go home today.  She will be discharged home today on 2 more days of Ceftin.     Severe sepsis: Present on admission Lactic acidosis -Antibiotics as above.  Off IV fluids.  Blood pressure on the lower side but stable.  Continue tube feeding. -Cultures as above.  Sepsis has resolved.   History of chronic systolic  CHF: Resolved -Last TTE in the system at the time of her VF arrest admission showed EF of 20%.  Repeat echo showed EF of 55 to 60%. -Outpatient follow-up with cardiology.  Continue home digoxin.  Home metoprolol and spironolactone on hold because of soft blood pressure   Dysphagia associated with mitral and dystrophy Severe malnutrition -SLP following.  Recommending NPO.  Outpatient follow-up with SLP -Continue tube feedings as per nutrition recommendations.  Outpatient follow-up.  High-grade heart block -Status post pacemaker.  Outpatient follow-up with EP   GERD -Continue PPI   Hypothyroidism -Continue levothyroxine   Mood disorder -Continue home Seroquel   Physical deconditioning -Patient has tolerated PT evaluation: PT recommends no PT follow-up   Discharge Instructions  Discharge Instructions     Increase activity slowly   Complete by: As directed       Allergies as of 05/25/2023       Reactions   Penicillin G Nausea And Vomiting, Rash, Other (See Comments)   Sore throat Has had cephalosporins in past   Penicillins Nausea And Vomiting, Rash, Other (See Comments)   Fever Has patient had a PCN reaction causing immediate rash, facial/tongue/throat swelling, SOB or lightheadedness with hypotension: Yes Has patient had a PCN reaction causing severe rash involving mucus membranes or skin necrosis: Unknown Has patient had a PCN reaction that required hospitalization: No Has patient had a PCN reaction occurring within the last 10 years: No If all of the above answers are "NO", then may proceed with Cephalosporin use.        Medication List     STOP taking these medications  [facility] INVASIVE CV LAB;  Service: Cardiovascular;  Laterality: N/A;  RIGHT/LEFT HEART CATH AND CORONARY ANGIOGRAPHY N/A 11/16/2022  Procedure:  RIGHT/LEFT HEART CATH AND CORONARY ANGIOGRAPHY;  Surgeon: Laurey Morale, MD;  Location: [facility] INVASIVE CV LAB;  Service: Cardiovascular;  Laterality: N/A;  THYROIDECTOMY N/A 04/21/2021  Procedure: TOTAL THYROIDECTOMY;  Surgeon: Axel Filler, MD;  Location: [facility] OR;  Service: General;  Laterality: N/A; Gwynneth Aliment, M.A., CF-SLP Speech Language Pathology, Acute Rehabilitation Services Secure Chat preferred [phone] 05/22/2023, 2:02 PM  ECHOCARDIOGRAM COMPLETE  Result Date: 05/21/2023    ECHOCARDIOGRAM REPORT   Patient Name:   Cynthia Underwood Date of Exam: 05/21/2023 Medical Rec #:  [id number]         Height:       63.0 in Accession #:    [id number]        Weight:       88.1 lb Date of Birth:  November 06, 1970        BSA:          1.365 m Patient Age:    51 years          BP:           86/56 mmHg Patient Gender: F                 HR:           71 bpm. Exam Location:  Inpatient Procedure: 2D Echo, Cardiac Doppler and Color Doppler Indications:    Congestive Heart Failure I50.9  History:        Patient has prior history of Echocardiogram examinations, most                 recent 11/15/2022. CHF, Pacemaker, Arrythmias:RBBB; Risk                 Factors:Former Smoker.  Sonographer:    Aron Baba Referring Phys: [id number] [facility]  Sonographer Comments: Suboptimal subcostal window. Image acquisition challenging due to respiratory motion and Image acquisition challenging due to patient body habitus. IMPRESSIONS  1. Left ventricular ejection fraction, by estimation, is 55 to 60%. The left ventricle has normal function. The left ventricle has no regional wall motion abnormalities. Left ventricular diastolic parameters were normal.  2. Right ventricular systolic function is normal. The right ventricular size is normal. There is normal pulmonary artery systolic pressure.  3. A small pericardial effusion is present.  4. The mitral valve is normal in structure. No evidence of mitral valve regurgitation. No evidence of  mitral stenosis.  5. The aortic valve is tricuspid. Aortic valve regurgitation is not visualized. No aortic stenosis is present.  6. The inferior vena cava is normal in size with greater than 50% respiratory variability, suggesting right atrial pressure of 3 mmHg. FINDINGS  Left Ventricle: Left ventricular ejection fraction, by estimation, is 55 to 60%. The left ventricle has normal function. The left ventricle has no regional wall motion abnormalities. The left ventricular internal cavity size was normal in size. There is  no left ventricular hypertrophy. Left ventricular diastolic parameters were normal. Right Ventricle: The right ventricular size is normal. Right ventricular systolic function is normal. There is normal pulmonary artery systolic pressure. The tricuspid regurgitant velocity is 2.36 m/s, and with an assumed right atrial pressure of 3 mmHg,  the estimated right ventricular systolic pressure is 25.3 mmHg. Left Atrium: Left atrial size was normal in size. Right Atrium: Right atrial size was normal in size. Pericardium: A small pericardial effusion is  Physician Discharge Summary  Cynthia Underwood YQM:[id number] DOB: 1970-11-11 DOA: 05/20/2023  PCP: Hoy Register, MD  Admit date: 05/20/2023 Discharge date: 05/25/2023  Admitted From: Home Disposition: Home  Recommendations for Outpatient Follow-up:  Follow up with PCP in 1 week with repeat CBC/BMP Follow up in ED if symptoms worsen or new appear   Home Health: No Equipment/Devices: None  Discharge Condition: Guarded CODE STATUS: Full Diet recommendation: N.p.o. by mouth.  Continue tube feeding as per dietary recommendations  Brief/Interim Summary: 52 y.o. female with hx of myotonic dystrophy, complicated by heart block requiring PPM, pneumonia and aspiration event resulting in VF arrest requiring trach and PEG, remains with PEG tube and trach has been taken down, chronic systolic heart failure, hypothyroidism presented with increasing cough with greenish sputum along with dysphagia.  She was admitted for aspiration pneumonia and started on antibiotics.  During the hospitalization, her condition is improved.  She has received 5 days of IV antibiotics.  She is currently on room air.  SLP recommended strict NPO.  She is tolerating tube feeding.  She feels okay to go home today.  She will be discharged home today on 2 more days of Ceftin.    Discharge Diagnoses:   Recurrent aspiration pneumonia in a patient with myotonic dystrophy Acute on chronic hypoxic respiratory failure (uses home oxygen 2 L/min only at night) -Respiratory status has improved.  Currently on room air during daytime. -Currently on Rocephin and Flagyl. -Blood cultures negative so far -She feels okay to go home today.  She will be discharged home today on 2 more days of Ceftin.     Severe sepsis: Present on admission Lactic acidosis -Antibiotics as above.  Off IV fluids.  Blood pressure on the lower side but stable.  Continue tube feeding. -Cultures as above.  Sepsis has resolved.   History of chronic systolic  CHF: Resolved -Last TTE in the system at the time of her VF arrest admission showed EF of 20%.  Repeat echo showed EF of 55 to 60%. -Outpatient follow-up with cardiology.  Continue home digoxin.  Home metoprolol and spironolactone on hold because of soft blood pressure   Dysphagia associated with mitral and dystrophy Severe malnutrition -SLP following.  Recommending NPO.  Outpatient follow-up with SLP -Continue tube feedings as per nutrition recommendations.  Outpatient follow-up.  High-grade heart block -Status post pacemaker.  Outpatient follow-up with EP   GERD -Continue PPI   Hypothyroidism -Continue levothyroxine   Mood disorder -Continue home Seroquel   Physical deconditioning -Patient has tolerated PT evaluation: PT recommends no PT follow-up   Discharge Instructions  Discharge Instructions     Increase activity slowly   Complete by: As directed       Allergies as of 05/25/2023       Reactions   Penicillin G Nausea And Vomiting, Rash, Other (See Comments)   Sore throat Has had cephalosporins in past   Penicillins Nausea And Vomiting, Rash, Other (See Comments)   Fever Has patient had a PCN reaction causing immediate rash, facial/tongue/throat swelling, SOB or lightheadedness with hypotension: Yes Has patient had a PCN reaction causing severe rash involving mucus membranes or skin necrosis: Unknown Has patient had a PCN reaction that required hospitalization: No Has patient had a PCN reaction occurring within the last 10 years: No If all of the above answers are "NO", then may proceed with Cephalosporin use.        Medication List     STOP taking these medications  [facility];  Service: Cardiovascular;  Laterality: N/A;  RIGHT/LEFT HEART CATH AND CORONARY ANGIOGRAPHY N/A 11/16/2022  Procedure:  RIGHT/LEFT HEART CATH AND CORONARY ANGIOGRAPHY;  Surgeon: Laurey Morale, MD;  Location: [facility] INVASIVE CV LAB;  Service: Cardiovascular;  Laterality: N/A;  THYROIDECTOMY N/A 04/21/2021  Procedure: TOTAL THYROIDECTOMY;  Surgeon: Axel Filler, MD;  Location: [facility] OR;  Service: General;  Laterality: N/A; Gwynneth Aliment, M.A., CF-SLP Speech Language Pathology, Acute Rehabilitation Services Secure Chat preferred [phone] 05/22/2023, 2:02 PM  ECHOCARDIOGRAM COMPLETE  Result Date: 05/21/2023    ECHOCARDIOGRAM REPORT   Patient Name:   Cynthia Underwood Date of Exam: 05/21/2023 Medical Rec #:  [id number]         Height:       63.0 in Accession #:    [id number]        Weight:       88.1 lb Date of Birth:  November 06, 1970        BSA:          1.365 m Patient Age:    51 years          BP:           86/56 mmHg Patient Gender: F                 HR:           71 bpm. Exam Location:  Inpatient Procedure: 2D Echo, Cardiac Doppler and Color Doppler Indications:    Congestive Heart Failure I50.9  History:        Patient has prior history of Echocardiogram examinations, most                 recent 11/15/2022. CHF, Pacemaker, Arrythmias:RBBB; Risk                 Factors:Former Smoker.  Sonographer:    Aron Baba Referring Phys: [id number] [facility]  Sonographer Comments: Suboptimal subcostal window. Image acquisition challenging due to respiratory motion and Image acquisition challenging due to patient body habitus. IMPRESSIONS  1. Left ventricular ejection fraction, by estimation, is 55 to 60%. The left ventricle has normal function. The left ventricle has no regional wall motion abnormalities. Left ventricular diastolic parameters were normal.  2. Right ventricular systolic function is normal. The right ventricular size is normal. There is normal pulmonary artery systolic pressure.  3. A small pericardial effusion is present.  4. The mitral valve is normal in structure. No evidence of mitral valve regurgitation. No evidence of  mitral stenosis.  5. The aortic valve is tricuspid. Aortic valve regurgitation is not visualized. No aortic stenosis is present.  6. The inferior vena cava is normal in size with greater than 50% respiratory variability, suggesting right atrial pressure of 3 mmHg. FINDINGS  Left Ventricle: Left ventricular ejection fraction, by estimation, is 55 to 60%. The left ventricle has normal function. The left ventricle has no regional wall motion abnormalities. The left ventricular internal cavity size was normal in size. There is  no left ventricular hypertrophy. Left ventricular diastolic parameters were normal. Right Ventricle: The right ventricular size is normal. Right ventricular systolic function is normal. There is normal pulmonary artery systolic pressure. The tricuspid regurgitant velocity is 2.36 m/s, and with an assumed right atrial pressure of 3 mmHg,  the estimated right ventricular systolic pressure is 25.3 mmHg. Left Atrium: Left atrial size was normal in size. Right Atrium: Right atrial size was normal in size. Pericardium: A small pericardial effusion is  [facility];  Service: Cardiovascular;  Laterality: N/A;  RIGHT/LEFT HEART CATH AND CORONARY ANGIOGRAPHY N/A 11/16/2022  Procedure:  RIGHT/LEFT HEART CATH AND CORONARY ANGIOGRAPHY;  Surgeon: Laurey Morale, MD;  Location: [facility] INVASIVE CV LAB;  Service: Cardiovascular;  Laterality: N/A;  THYROIDECTOMY N/A 04/21/2021  Procedure: TOTAL THYROIDECTOMY;  Surgeon: Axel Filler, MD;  Location: [facility] OR;  Service: General;  Laterality: N/A; Gwynneth Aliment, M.A., CF-SLP Speech Language Pathology, Acute Rehabilitation Services Secure Chat preferred [phone] 05/22/2023, 2:02 PM  ECHOCARDIOGRAM COMPLETE  Result Date: 05/21/2023    ECHOCARDIOGRAM REPORT   Patient Name:   Cynthia Underwood Date of Exam: 05/21/2023 Medical Rec #:  [id number]         Height:       63.0 in Accession #:    [id number]        Weight:       88.1 lb Date of Birth:  November 06, 1970        BSA:          1.365 m Patient Age:    51 years          BP:           86/56 mmHg Patient Gender: F                 HR:           71 bpm. Exam Location:  Inpatient Procedure: 2D Echo, Cardiac Doppler and Color Doppler Indications:    Congestive Heart Failure I50.9  History:        Patient has prior history of Echocardiogram examinations, most                 recent 11/15/2022. CHF, Pacemaker, Arrythmias:RBBB; Risk                 Factors:Former Smoker.  Sonographer:    Aron Baba Referring Phys: [id number] [facility]  Sonographer Comments: Suboptimal subcostal window. Image acquisition challenging due to respiratory motion and Image acquisition challenging due to patient body habitus. IMPRESSIONS  1. Left ventricular ejection fraction, by estimation, is 55 to 60%. The left ventricle has normal function. The left ventricle has no regional wall motion abnormalities. Left ventricular diastolic parameters were normal.  2. Right ventricular systolic function is normal. The right ventricular size is normal. There is normal pulmonary artery systolic pressure.  3. A small pericardial effusion is present.  4. The mitral valve is normal in structure. No evidence of mitral valve regurgitation. No evidence of  mitral stenosis.  5. The aortic valve is tricuspid. Aortic valve regurgitation is not visualized. No aortic stenosis is present.  6. The inferior vena cava is normal in size with greater than 50% respiratory variability, suggesting right atrial pressure of 3 mmHg. FINDINGS  Left Ventricle: Left ventricular ejection fraction, by estimation, is 55 to 60%. The left ventricle has normal function. The left ventricle has no regional wall motion abnormalities. The left ventricular internal cavity size was normal in size. There is  no left ventricular hypertrophy. Left ventricular diastolic parameters were normal. Right Ventricle: The right ventricular size is normal. Right ventricular systolic function is normal. There is normal pulmonary artery systolic pressure. The tricuspid regurgitant velocity is 2.36 m/s, and with an assumed right atrial pressure of 3 mmHg,  the estimated right ventricular systolic pressure is 25.3 mmHg. Left Atrium: Left atrial size was normal in size. Right Atrium: Right atrial size was normal in size. Pericardium: A small pericardial effusion is  [facility] INVASIVE CV LAB;  Service: Cardiovascular;  Laterality: N/A;  RIGHT/LEFT HEART CATH AND CORONARY ANGIOGRAPHY N/A 11/16/2022  Procedure:  RIGHT/LEFT HEART CATH AND CORONARY ANGIOGRAPHY;  Surgeon: Laurey Morale, MD;  Location: [facility] INVASIVE CV LAB;  Service: Cardiovascular;  Laterality: N/A;  THYROIDECTOMY N/A 04/21/2021  Procedure: TOTAL THYROIDECTOMY;  Surgeon: Axel Filler, MD;  Location: [facility] OR;  Service: General;  Laterality: N/A; Gwynneth Aliment, M.A., CF-SLP Speech Language Pathology, Acute Rehabilitation Services Secure Chat preferred [phone] 05/22/2023, 2:02 PM  ECHOCARDIOGRAM COMPLETE  Result Date: 05/21/2023    ECHOCARDIOGRAM REPORT   Patient Name:   Cynthia Underwood Date of Exam: 05/21/2023 Medical Rec #:  [id number]         Height:       63.0 in Accession #:    [id number]        Weight:       88.1 lb Date of Birth:  November 06, 1970        BSA:          1.365 m Patient Age:    51 years          BP:           86/56 mmHg Patient Gender: F                 HR:           71 bpm. Exam Location:  Inpatient Procedure: 2D Echo, Cardiac Doppler and Color Doppler Indications:    Congestive Heart Failure I50.9  History:        Patient has prior history of Echocardiogram examinations, most                 recent 11/15/2022. CHF, Pacemaker, Arrythmias:RBBB; Risk                 Factors:Former Smoker.  Sonographer:    Aron Baba Referring Phys: [id number] [facility]  Sonographer Comments: Suboptimal subcostal window. Image acquisition challenging due to respiratory motion and Image acquisition challenging due to patient body habitus. IMPRESSIONS  1. Left ventricular ejection fraction, by estimation, is 55 to 60%. The left ventricle has normal function. The left ventricle has no regional wall motion abnormalities. Left ventricular diastolic parameters were normal.  2. Right ventricular systolic function is normal. The right ventricular size is normal. There is normal pulmonary artery systolic pressure.  3. A small pericardial effusion is present.  4. The mitral valve is normal in structure. No evidence of mitral valve regurgitation. No evidence of  mitral stenosis.  5. The aortic valve is tricuspid. Aortic valve regurgitation is not visualized. No aortic stenosis is present.  6. The inferior vena cava is normal in size with greater than 50% respiratory variability, suggesting right atrial pressure of 3 mmHg. FINDINGS  Left Ventricle: Left ventricular ejection fraction, by estimation, is 55 to 60%. The left ventricle has normal function. The left ventricle has no regional wall motion abnormalities. The left ventricular internal cavity size was normal in size. There is  no left ventricular hypertrophy. Left ventricular diastolic parameters were normal. Right Ventricle: The right ventricular size is normal. Right ventricular systolic function is normal. There is normal pulmonary artery systolic pressure. The tricuspid regurgitant velocity is 2.36 m/s, and with an assumed right atrial pressure of 3 mmHg,  the estimated right ventricular systolic pressure is 25.3 mmHg. Left Atrium: Left atrial size was normal in size. Right Atrium: Right atrial size was normal in size. Pericardium: A small pericardial effusion is

## 2023-05-28 ENCOUNTER — Telehealth: Payer: Self-pay

## 2023-05-28 ENCOUNTER — Ambulatory Visit: Payer: 59 | Attending: Family Medicine

## 2023-05-28 DIAGNOSIS — E559 Vitamin D deficiency, unspecified: Secondary | ICD-10-CM | POA: Diagnosis not present

## 2023-05-28 DIAGNOSIS — E876 Hypokalemia: Secondary | ICD-10-CM | POA: Diagnosis not present

## 2023-05-28 DIAGNOSIS — E890 Postprocedural hypothyroidism: Secondary | ICD-10-CM

## 2023-05-28 LAB — CUP PACEART REMOTE DEVICE CHECK
Battery Remaining Longevity: 49 mo
Battery Voltage: 2.95 V
Brady Statistic AP VP Percent: 38.13 %
Brady Statistic AP VS Percent: 33.6 %
Brady Statistic AS VP Percent: 27.27 %
Brady Statistic AS VS Percent: 1.01 %
Brady Statistic RA Percent Paced: 71.73 %
Brady Statistic RV Percent Paced: 65.42 %
Date Time Interrogation Session: 20240920184310
Implantable Lead Connection Status: 753985
Implantable Lead Connection Status: 753985
Implantable Lead Implant Date: 20180614
Implantable Lead Implant Date: 20180614
Implantable Lead Location: 753859
Implantable Lead Location: 753859
Implantable Lead Model: 3830
Implantable Lead Model: 5076
Implantable Pulse Generator Implant Date: 20180614
Lead Channel Impedance Value: 285 Ohm
Lead Channel Impedance Value: 342 Ohm
Lead Channel Impedance Value: 380 Ohm
Lead Channel Impedance Value: 494 Ohm
Lead Channel Pacing Threshold Amplitude: 0.625 V
Lead Channel Pacing Threshold Amplitude: 1.375 V
Lead Channel Pacing Threshold Pulse Width: 0.4 ms
Lead Channel Pacing Threshold Pulse Width: 0.4 ms
Lead Channel Sensing Intrinsic Amplitude: 2.625 mV
Lead Channel Sensing Intrinsic Amplitude: 2.625 mV
Lead Channel Sensing Intrinsic Amplitude: 3.625 mV
Lead Channel Sensing Intrinsic Amplitude: 3.625 mV
Lead Channel Setting Pacing Amplitude: 2 V
Lead Channel Setting Pacing Amplitude: 2 V
Lead Channel Setting Pacing Pulse Width: 1 ms
Lead Channel Setting Sensing Sensitivity: 0.9 mV
Zone Setting Status: 755011
Zone Setting Status: 755011

## 2023-05-28 MED ORDER — MISC. DEVICES MISC: 0 refills | Status: DC

## 2023-05-28 NOTE — Telephone Encounter (Signed)
Prescription has been written.

## 2023-05-28 NOTE — Transitions of Care (Post Inpatient/ED Visit) (Signed)
05/28/2023  Name: GERETHA HOLLIFIELD MRN: [id number] DOB: 1970/12/13  Today's TOC FU Call Status: Today's TOC FU Call Status:: Unsuccessful Call (1st Attempt) Unsuccessful Call (1st Attempt) Date: 05/28/23  Attempted to reach the patient regarding the most recent Inpatient/ED visit.  Follow Up Plan: Additional outreach attempts will be made to reach the patient to complete the Transitions of Care (Post Inpatient/ED visit) call.   Signature [name], RN

## 2023-05-29 ENCOUNTER — Telehealth: Payer: Self-pay

## 2023-05-29 ENCOUNTER — Other Ambulatory Visit: Payer: Self-pay | Admitting: Family Medicine

## 2023-05-29 DIAGNOSIS — E890 Postprocedural hypothyroidism: Secondary | ICD-10-CM

## 2023-05-29 LAB — POTASSIUM: Potassium: 4.3 mmol/L (ref 3.5–5.2)

## 2023-05-29 LAB — T3, FREE: T3, Free: 2 pg/mL (ref 2.0–4.4)

## 2023-05-29 LAB — T4, FREE: Free T4: 1.07 ng/dL (ref 0.82–1.77)

## 2023-05-29 LAB — VITAMIN D 25 HYDROXY (VIT D DEFICIENCY, FRACTURES): Vit D, 25-Hydroxy: 37.7 ng/mL (ref 30.0–100.0)

## 2023-05-29 LAB — TSH: TSH: 0.226 u[IU]/mL — ABNORMAL LOW (ref 0.450–4.500)

## 2023-05-29 MED ORDER — LEVOTHYROXINE SODIUM 100 MCG PO TABS: 100.0000 ug | ORAL_TABLET | Freq: Every day | ORAL | 0 refills | Status: DC

## 2023-05-29 NOTE — Transitions of Care (Post Inpatient/ED Visit) (Signed)
05/29/2023  Name: Cynthia Underwood MRN: [id number] DOB: 04/02/1971  Today's TOC FU Call Status: Today's TOC FU Call Status:: Successful TOC FU Call Completed Unsuccessful Call (1st Attempt) Date: 05/28/23 [facility] FU Call Complete Date: 05/29/23 Patient's Name and Date of Birth confirmed.  Transition Care Management Follow-up Telephone Call Date of Discharge: 05/25/23 Discharge Facility: [facility]) Type of Discharge: Inpatient Admission Primary Inpatient Discharge Diagnosis:: aspiration pneumonia How have you been since you were released from the hospital?: Better Any questions or concerns?: Yes Patient Questions/Concerns:: She has O2 from [facility]- room concentrator and portable tanks but would like a POC. I told her that an order or a POC was sent to [facility] so I will need to check on that and re-send to [facility].  She has been using her O2 @ 4L only when needed Patient Questions/Concerns Addressed: Other: (I spoke to [employer] and she confirmed that they received the POC order but they do not provider the O2 she currently has at home.  I then sent the POC order to [facility])  Items Reviewed: Did you receive and understand the discharge instructions provided?: Yes Medications obtained,verified, and reconciled?: Partial Review Completed Reason for Partial Mediation Review: She said we did not need to review the entire list. She stated that she has all of her medications and was only missing the synthroid.  I told her that a rx for that was sent to her pharmacy today.  She also confirmed she has a nebulizer. Any new allergies since your discharge?: No Dietary orders reviewed?: Yes Type of Diet Ordered:: Jevity tube feedings were ordered. She said she does not have the Jevity yet and has been using Ensure. I told her that I would contact [facility] to check on the status of an order for Jevity.  She said she is able to tolerate water orally without any difficulty.  She  stated she was told that she could have as much water orally as she wants. Do you have support at home?: Yes People in Home: other relative(s) Name of Support/Comfort Primary Source: She said she lives with her cousin.  Medications Reviewed Today: Medications Reviewed Today   Medications were not reviewed in this encounter     Home Care and Equipment/Supplies: Were Home Health Services Ordered?: No Any new equipment or medical supplies ordered?: No  Functional Questionnaire: Do you need assistance with bathing/showering or dressing?: No Do you need assistance with meal preparation?: No Do you need assistance with eating?: No (her nutrition is entral and she said she administers her own tube feedings.) Do you have difficulty maintaining continence: No Do you need assistance with getting out of bed/getting out of a chair/moving?: No (She said she is independent with ambuliation but has a walker to use if needed.) Do you have difficulty managing or taking your medications?: Yes (She said that her cousin helps her manage her meds.)  Follow up appointments reviewed: PCP Follow-up appointment confirmed?: Yes Date of PCP follow-up appointment?: 08/22/23 Follow-up Provider: Dr [name].  She did not want to re-schedled to be seen sooner. Specialist Hospital Follow-up appointment confirmed?: Yes Date of Specialist follow-up appointment?: 06/12/23 Follow-Up Specialty Provider:: GI, 07/10/2023-cardiology, 07/23/2023-pulmonary. Do you need transportation to your follow-up appointment?: No Do you understand care options if your condition(s) worsen?: Yes-patient verbalized understanding    SIGNATURE  [name], RN

## 2023-05-29 NOTE — Telephone Encounter (Signed)
Order has been faxed to [facility]

## 2023-05-30 ENCOUNTER — Telehealth: Payer: Self-pay | Admitting: Family Medicine

## 2023-05-30 ENCOUNTER — Telehealth: Payer: Self-pay

## 2023-05-30 NOTE — Telephone Encounter (Signed)
Routing to PCP

## 2023-05-30 NOTE — Telephone Encounter (Signed)
[facility] is calling in because they received a script for this pt, but the script needs to say POC EVAL on it. [facility] says they can do their own POC Eval if there isn't one on file,but the insurance is requiring it.

## 2023-05-30 NOTE — Telephone Encounter (Signed)
I spoke to Rob/ [facility]: [phone] regarding the status of the Jevity order and he said they never received an order.    I then sent a message to Lucy Antigua, RN informing her that [facility] has not received an order for the tube feedings and per her note : [facility] to deliver two days worth of Feeding until [facility] could deliver at home.  Assistance requested.

## 2023-05-31 ENCOUNTER — Telehealth (INDEPENDENT_AMBULATORY_CARE_PROVIDER_SITE_OTHER): Payer: Self-pay | Admitting: Family Medicine

## 2023-05-31 NOTE — Telephone Encounter (Signed)
Copied from CRM [id number]. Topic: General - Other >> May 31, 2023 12:24 PM [place] B wrote: Reason for CRM: Daphne, case Production designer, theatre/television/film, called in about if Dr [name] can sign forms for pt to get tube feedings

## 2023-05-31 NOTE — Telephone Encounter (Signed)
I received a message that patient's sister, [name], called and requested a call back.  I returned her call and had to leave a message.  I spoke to [place], RN CM and explained to her that Dr [name] will need to review the chart prior to signing any orders. I then asked if she could consult with the dietician and obtain orders from the hospital provider even though the patient has been discharged. This would expedite the order and the patient would be able to receive her nutrition.  Daphne said she would work on it and provide me with an update.

## 2023-05-31 NOTE — TOC Progression Note (Signed)
Transition of Care [facility]) - Progression Note    Patient Details  Name: Cynthia Underwood MRN: [id number] Date of Birth: Aug 21, 1971  Transition of Care [facility]) CM/SW Contact  Tom-Johnson, Hershal Coria, RN Phone Number: 05/31/2023, 4:26 PM  Clinical Narrative:     CM informed that patient did not receive her Tube feedings from [facility] d/t middle part of Enteral Order Form not filled out.  CM had emailed form to MD to fill out and sign. CM emailed signed form to [place] same day but received this information after patient is discharged.  MD assigned to patient is off duty and CM contacted Registered Dietician who assisted to fill out remainder of form and CM emailed form to [place].  [place] called and informed CM that form was received and she will forward to Enteral team to process order.  CM called and spoke with patient's sister, Cynthia Underwood and updated her.   No further TOC needs noted.            Barriers to Discharge: Family Issues  Expected Discharge Plan and Services         Expected Discharge Date: 05/25/23               DME Arranged: Oxygen DME Agency: [facility] Date DME Agency Contacted: 05/25/23 Time DME Agency Contacted: 1130 Representative spoke with at DME Agency: Earna Coder HH Arranged: NA HH Agency: NA         Social Determinants of Health (SDOH) Interventions SDOH Screenings   Food Insecurity: Food Insecurity Present (05/21/2023)  Housing: Low Risk  (05/21/2023)  Transportation Needs: No Transportation Needs (05/21/2023)  Utilities: Not At Risk (05/21/2023)  Alcohol Screen: Low Risk  (10/16/2022)  Depression (PHQ2-9): Medium Risk (05/16/2023)  Financial Resource Strain: Low Risk  (10/16/2022)  Physical Activity: Inactive (10/16/2022)  Social Connections: Socially Isolated (10/16/2022)  Stress: Stress Concern Present (10/16/2022)  Tobacco Use: Medium Risk (05/20/2023)    Readmission Risk Interventions    05/21/2023    4:01 PM  Readmission Risk  Prevention Plan  Transportation Screening Complete  PCP or Specialist Appt within 3-5 Days Complete  HRI or Home Care Consult Complete  Social Work Consult for Recovery Care Planning/Counseling Complete  Palliative Care Screening Not Applicable  Medication Review Oceanographer) Referral to Pharmacy

## 2023-05-31 NOTE — Consult Note (Signed)
[facility]  [facility] Inpatient Consult   05/31/2023  Cynthia Underwood 1971/02/28 [id number]   Post hospital: referral  Call received from inpatient [facility] RN, Bard Herbert regarding ongoing issues with patient's tube feeding. Chart was assessed for post hospital follow up and reached out to [facility] as well to update and follow up as needed.  For questions,  Charlesetta Shanks, RN, BSN, CCM [place]  [facility], [facility] Liaison Direct Dial: [phone] or secure chat Website: [name]

## 2023-05-31 NOTE — Telephone Encounter (Signed)
Pt is calling to speak to [place]. Please advise [phone]

## 2023-05-31 NOTE — Telephone Encounter (Signed)
Message received from [facility]- Laural Benes, RN CM stating she was able to have one of the nutritionists complete the form/order for the enteral feedings and she emailed it to [employer]. She also spoke to patient's sister,[name], and provided her with an update on the order.

## 2023-06-01 ENCOUNTER — Ambulatory Visit: Payer: Self-pay

## 2023-06-01 DIAGNOSIS — R1310 Dysphagia, unspecified: Secondary | ICD-10-CM | POA: Diagnosis not present

## 2023-06-01 DIAGNOSIS — Z931 Gastrostomy status: Secondary | ICD-10-CM | POA: Diagnosis not present

## 2023-06-01 DIAGNOSIS — I5043 Acute on chronic combined systolic (congestive) and diastolic (congestive) heart failure: Secondary | ICD-10-CM | POA: Diagnosis not present

## 2023-06-01 DIAGNOSIS — G931 Anoxic brain damage, not elsewhere classified: Secondary | ICD-10-CM | POA: Diagnosis not present

## 2023-06-01 DIAGNOSIS — E43 Unspecified severe protein-calorie malnutrition: Secondary | ICD-10-CM | POA: Diagnosis not present

## 2023-06-01 MED ORDER — MISC. DEVICES MISC: 0 refills | Status: DC

## 2023-06-01 NOTE — Telephone Encounter (Signed)
FYI  May be a duplicate message.

## 2023-06-01 NOTE — Telephone Encounter (Signed)
Done

## 2023-06-04 NOTE — Progress Notes (Signed)
Remote pacemaker transmission.   

## 2023-06-04 NOTE — Telephone Encounter (Signed)
Call returned to patient: [phone] , message left with call back requested.

## 2023-06-04 NOTE — Patient Instructions (Signed)
Visit Information  Thank you for taking time to visit with me today. Please don't hesitate to contact me if I can be of assistance to you.   Following are the goals we discussed today:   Goals Addressed             This Visit's Progress    RN Care Coordination Activities: further follow up needed   On track    Care Coordination Interventions: Completed successful outbound call with sister Judythe Postema Determined patient was discharged home but continues to need her Jevity tube feeding, the order was placed with [facility] but has yet to be received Discussed the hospital Nutritionist provided several boxes of Jevity upon patient's discharge home, however sister states this supply is running low  Placed outbound call to [facility], spoke with CSR who verified patient's shipment of Jevity and supplies will be shipped out today by 8 PM, after which patient/family may verify shipping status with the following tracking numbers;  1) [id number] 2) [id number] 3) [id number] 4) [id number] 5) [id number] Provided sister with the above stated information via email, [email or web address] and discussed sister [name] will contact the Nutritionist to inquire about additional formula until the shipment arrives, she confirms having the contact number for this Nutritionist  Reviewed and discussed patient's upcoming scheduled hospital follow up scheduled for 06/12/23 @10 :00 AM         Our next appointment is by telephone on 06/14/23 at 2:30 PM   Please call the care guide team at [phone] if you need to cancel or reschedule your appointment.   If you are experiencing a Mental Health or Behavioral Health Crisis or need someone to talk to, please call 1-800-273-TALK (toll free, 24 hour hotline)  Patient verbalizes understanding of instructions and care plan provided today and agrees to view in MyChart. Active MyChart status and patient  understanding of how to access instructions and care plan via MyChart confirmed with patient.     [name] RN BSN CCM [place]  [facility], [facility] Nurse Care Coordinator  Direct Dial: [phone] Website: [email or web address]

## 2023-06-04 NOTE — Patient Outreach (Signed)
Care Coordination   Follow Up Visit Note   06/01/2023 Name: ABREA INDOVINA MRN: [id number] DOB: Mar 13, 1971  Elder Love Cuff is a 52 y.o. year old female who sees Hoy Register, MD for primary care. I spoke with  Elder Love Contino by phone today.  What matters to the patients health and wellness today?  Patient/sister would like to receive patient's Jevity for tube feedings.     Goals Addressed             This Visit's Progress    RN Care Coordination Activities: further follow up needed   On track    Care Coordination Interventions: Completed successful outbound call with sister Jalilah Hawken Determined patient was discharged home but continues to need her Jevity tube feeding, the order was placed with [facility] but has yet to be received Discussed the hospital Nutritionist provided several boxes of Jevity upon patient's discharge home, however sister states this supply is running low  Placed outbound call to [facility], spoke with CSR who verified patient's shipment of Jevity and supplies will be shipped out today by 8 PM, after which patient/family may verify shipping status with the following tracking numbers;  1) [id number] 2) [id number] 3) [id number] 4) [id number] 5) [id number] Provided sister with the above stated information via email, [email or web address] and discussed sister [name] will contact the Nutritionist to inquire about additional formula until the shipment arrives, she confirms having the contact number for this Nutritionist  Reviewed and discussed patient's upcoming scheduled hospital follow up scheduled for 06/12/23 @10 :00 AM     Interventions Today    Flowsheet Row Most Recent Value  Chronic Disease   Chronic disease during today's visit Other  [s/p aspirational pneumonia,  enteral feeding]  General Interventions   General Interventions Discussed/Reviewed General Interventions Discussed, General  Interventions Reviewed, Doctor Visits, Communication with, Durable Medical Equipment (DME)  Doctor Visits Discussed/Reviewed Doctor Visits Discussed, Doctor Visits Reviewed, Specialist, PCP  Durable Medical Equipment (DME) Oxygen, Other  [feeding pump]  Communication with --  [[facility] re: Jevity for Enteral feeding]  Exercise Interventions   Exercise Discussed/Reviewed Weight Managment  Weight Management Weight maintenance  Education Interventions   Education Provided Provided Education  Provided Verbal Education On Nutrition, When to see the doctor, Medication  Nutrition Interventions   Nutrition Discussed/Reviewed Nutrition Discussed, Nutrition Reviewed, Supplemental nutrition  [tube feeding]          SDOH assessments and interventions completed:  No     Care Coordination Interventions:  Yes, provided   Follow up plan: Follow up call scheduled for 06/14/23 @2 :30 PM     Encounter Outcome:  Patient Visit Completed

## 2023-06-08 ENCOUNTER — Telehealth: Payer: Self-pay

## 2023-06-08 NOTE — Telephone Encounter (Signed)
Copied from CRM [id number]. Topic: General - Other >> Jun 08, 2023 10:16 AM Macon Large wrote: Reason for CRM: Pt unable to do cologuard kit and requests call back to discuss getting a colonoscopy. Cb# [phone]

## 2023-06-11 ENCOUNTER — Other Ambulatory Visit: Payer: Self-pay

## 2023-06-11 DIAGNOSIS — Z1211 Encounter for screening for malignant neoplasm of colon: Secondary | ICD-10-CM

## 2023-06-11 NOTE — Telephone Encounter (Signed)
Referral has been placed for patient

## 2023-06-12 ENCOUNTER — Telehealth: Payer: Self-pay

## 2023-06-12 ENCOUNTER — Ambulatory Visit: Payer: Medicare Other | Admitting: Neurology

## 2023-06-12 ENCOUNTER — Encounter: Payer: Self-pay | Admitting: Physician Assistant

## 2023-06-12 ENCOUNTER — Ambulatory Visit (INDEPENDENT_AMBULATORY_CARE_PROVIDER_SITE_OTHER): Payer: 59 | Admitting: Physician Assistant

## 2023-06-12 VITALS — BP 90/60 | HR 68 | Ht 63.0 in | Wt 85.6 lb

## 2023-06-12 DIAGNOSIS — Z860101 Personal history of adenomatous and serrated colon polyps: Secondary | ICD-10-CM

## 2023-06-12 DIAGNOSIS — Z931 Gastrostomy status: Secondary | ICD-10-CM

## 2023-06-12 DIAGNOSIS — J690 Pneumonitis due to inhalation of food and vomit: Secondary | ICD-10-CM

## 2023-06-12 DIAGNOSIS — E46 Unspecified protein-calorie malnutrition: Secondary | ICD-10-CM

## 2023-06-12 NOTE — Telephone Encounter (Signed)
Copied from CRM [id number]. Topic: General - Other >> Jun 12, 2023 12:40 PM Santiya F wrote: Reason for CRM: Claudette and Pt are calling in because she needs a modified barium study faxed over to [facility]. Pt says this is an urgent matter and would like it sent as soon as possible. Fax: [phone], Joyanna says if you have any questions to please call this number: [phone]

## 2023-06-12 NOTE — Telephone Encounter (Signed)
Pt's stepmother called in states needs someone to manage pt's protein supplement and also waiting for cb about modified barium study

## 2023-06-12 NOTE — Progress Notes (Signed)
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 Lavon Paganini, who was referred to me by Hoy Register, MD for a complaint of diarrhea and G-tube nutrition concerns.      10/07/2019 colonoscopy with two 1-2 mm polyps in transverse and ascending colon one 10 mm polyp in the ascending colon as well as nonbleeding internal hemorrhoids.  Pathology showed adenomatous polyps.  Repeat recommended 3 years.    05/20/2023-05/25/2023 patient admitted to the hospital for aspiration pneumonia.  At that time discharged with n.p.o. by mouth and continue tube feedings.    05/22/2023 modified barium swallow study time they recommended n.p.o. with only feeding through the G-tube.    Today, patient presents to clinic accompanied by her mother-in-law and they are frustrated given the patient had a G-tube placed and then was never really advised on nutrition excetra.  Apparently was in the hospital for a period of 4 months initially after this was placed and was on nutrition there and just sent home on the same things.  Also having a liquid/pured diet in the hospital but when she got home she increased her diet on her own and ended up back in the hospital in September with aspiration pneumonia.  They repeated a swallow study that time as above with recommendations to go home with home health speech therapy which has not been initiated.  She was told to not eat anything by mouth and only have G-tube feeds.  Describes diarrhea which is controlled with over-the-counter Imodium as needed.  They just want to know what she should be eating in  order to gain weight and have nutrition and whether or not people will be reevaluating to see if she can eventually eat by mouth.    Denies fever, chills or blood in her stool.  Past Medical History:  Diagnosis Date   Bifascicular block 10/02/2014   Cataract    Dyspnea    Excess ear wax    Multinodular goiter    Pneumonia    Presence of permanent cardiac pacemaker    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   Sickle cell trait (HCC)    Watery eyes    Left   Weakness of both legs     Past Surgical History:  Procedure Laterality Date   BREAST BIOPSY Right    EYE SURGERY Left 2017   "related to blockage in my nose"   EYE SURGERY Right 05/2019   INSERT / REPLACE / REMOVE PACEMAKER  02/15/2017   IR GASTROSTOMY TUBE MOD SED  01/01/2023   PACEMAKER IMPLANT N/A 02/15/2017   Procedure: Pacemaker Implant;  Surgeon: Duke Salvia, MD;  Location: [facility] INVASIVE CV LAB;  Service: Cardiovascular;  Laterality: N/A;   RIGHT/LEFT HEART CATH AND CORONARY ANGIOGRAPHY N/A 11/16/2022   Procedure: RIGHT/LEFT HEART CATH AND CORONARY ANGIOGRAPHY;  Surgeon: Laurey Morale, MD;  Location: [facility] INVASIVE CV LAB;  Service: Cardiovascular;  Laterality: N/A;   THYROIDECTOMY N/A 04/21/2021   Procedure: TOTAL THYROIDECTOMY;  Surgeon: Axel Filler, MD;  Location: [facility] OR;  Service: General;  Laterality: N/A;    Current Outpatient Medications  Medication Sig  Dispense Refill   acetaminophen (TYLENOL) 500 MG tablet Place 500 mg into feeding tube every 6 (six) hours as needed for mild pain.     digoxin (LANOXIN) 0.25 MG tablet Place 250 mcg into feeding tube daily.     guaifenesin (HUMIBID E) 400 MG TABS tablet 400 mg 3 (three) times daily. Place 400mg  into feeding tube three times a day     hydrocortisone-pramoxine (ANALPRAM HC) 2.5-1 % rectal cream Place 1 Application rectally 3 (three) times daily. (Patient not taking: Reported on 05/21/2023) 30 g 3   ipratropium-albuterol  (DUONEB) 0.5-2.5 (3) MG/3ML SOLN Inhale 3 mLs into the lungs every 4 (four) hours as needed. SMARTSIG:1 Unspecified Via Inhaler Every 4 Hours PRN 360 mL 3   levothyroxine (SYNTHROID) 100 MCG tablet Place 1 tablet (100 mcg total) into feeding tube daily before breakfast. 90 tablet 0   metoCLOPramide (REGLAN) 5 MG tablet Place 5 mg into feeding tube.     Misc. Devices MISC POC eval Diagnosis-aspiration pneumonia 1 each 0   Nutritional Supplements (FEEDING SUPPLEMENT, JEVITY 1.5 CAL/FIBER,) LIQD Place 237 mLs into feeding tube 4 (four) times daily. FWF before and after each feeding 1000 mL 30   pantoprazole (PROTONIX) 40 MG tablet 40 mg daily. Place 40mg  into the feeding tube daily     QUEtiapine (SEROQUEL) 25 MG tablet Place 1 tablet (25 mg total) into feeding tube at bedtime.     scopolamine (TRANSDERM-SCOP) 1 MG/3DAYS Place 1 patch onto the skin every 3 (three) days.     Water For Irrigation, Sterile (FREE WATER) SOLN free water via tube before and after each feeding     No current facility-administered medications for this visit.    Allergies as of 06/12/2023 - Review Complete 06/01/2023  Allergen Reaction Noted   Penicillin g Nausea And Vomiting, Rash, and Other (See Comments) 07/27/2015   Penicillins Nausea And Vomiting, Rash, and Other (See Comments) 05/16/2014    Family History  Problem Relation Age of Onset   Cancer Father 12       Deceased   Heart disease Father    COPD Mother    Diabetes Maternal Uncle    Heart disease Maternal Uncle    Heart disease Paternal Grandmother    Diabetes Paternal Grandmother    Hypertension Paternal Grandmother    Neuromuscular disorder Maternal Aunt        Myotonic dystrophy   Neuromuscular disorder Cousin        Myotonic dystrophy   Neuromuscular disorder Sister        Myotonic dystrophy   Colon cancer Neg Hx    Colon polyps Neg Hx    Esophageal cancer Neg Hx    Rectal cancer Neg Hx    Stomach cancer Neg Hx     Social  History   Socioeconomic History   Marital status: Single    Spouse name: Not on file   Number of children: 0   Years of education: 1.5 Collge   Highest education level: Not on file  Occupational History   Occupation: Unemployed    Employer: OTHER  Tobacco Use   Smoking status: Former    Current packs/day: 0.00    Types: Cigarettes    Start date: 10/04/1988    Quit date: 10/04/2016    Years since quitting: 6.6   Smokeless tobacco: Never  Vaping Use   Vaping status: Never Used  Substance and Sexual Activity   Alcohol use: Not Currently    Alcohol/week: 0.0  standard drinks of alcohol    Comment:  few times a year   Drug use: Not Currently    Types: Marijuana    Comment: occ    Sexual activity: Not Currently    Birth control/protection: None, Condom  Other Topics Concern   Not on file  Social History Narrative   Patient lives at home with her aunt.   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  She moved to [facility] in August 2015.   She has been on disability since 2010.   Education: 1 1/2 years of college.   Caffeine - coke 2 cans/day   Exercise - no   Social Determinants of Health   Financial Resource Strain: Low Risk  (10/16/2022)   Overall Financial Resource Strain (CARDIA)    Difficulty of Paying Living Expenses: Not hard at all  Food Insecurity: Food Insecurity Present (05/21/2023)   Hunger Vital Sign    Worried About Running Out of Food in the Last Year: Sometimes true    Ran Out of Food in the Last Year: Sometimes true  Transportation Needs: No Transportation Needs (05/21/2023)   PRAPARE - Administrator, Civil Service (Medical): No    Lack of Transportation (Non-Medical): No  Physical Activity: Inactive (10/16/2022)   Exercise Vital Sign    Days of Exercise per Week: 0 days    Minutes of Exercise per Session: 0 min  Stress: Stress Concern Present (10/16/2022)   [employer] of Occupational Health - Occupational Stress  Questionnaire    Feeling of Stress : To some extent  Social Connections: Socially Isolated (10/16/2022)   Social Connection and Isolation Panel [NHANES]    Frequency of Communication with Friends and Family: Three times a week    Frequency of Social Gatherings with Friends and Family: Three times a week    Attends Religious Services: Never    Active Member of Clubs or Organizations: No    Attends Banker Meetings: Never    Marital Status: Never married  Intimate Partner Violence: Not At Risk (05/21/2023)   Humiliation, Afraid, Rape, and Kick questionnaire    Fear of Current or Ex-Partner: No    Emotionally Abused: No    Physically Abused: No    Sexually Abused: No    Review of Systems:    Constitutional: No fever or chills Skin: No rash Cardiovascular: No chest pain Respiratory: No SOB  Gastrointestinal: See HPI and otherwise negative Genitourinary: No dysuria  Neurological: No headache Musculoskeletal: No new muscle or joint pain Hematologic: No bleeding  Psychiatric: No history of depression or anxiety   Physical Exam:  Vital signs: BP 90/60   Pulse 68   Ht 5\' 3"  (1.6 m)   Wt 85 lb 9.6 oz (38.8 kg)   LMP 12/03/2016 (Approximate)   SpO2 98%   BMI 15.16 kg/m    Constitutional:   Pleasant cachectic AA female appears to be in NAD, Well developed, alert and cooperative Head:  Normocephalic and atraumatic. Eyes:   PEERL, EOMI. No icterus. Conjunctiva pink. Ears:  Normal auditory acuity. Neck:  Supple Throat: Oral cavity and pharynx without inflammation, swelling or lesion.  Respiratory: Respirations even and unlabored. Lungs clear to auscultation bilaterally.   No wheezes, crackles, or rhonchi.  Cardiovascular: Normal S1, S2. No MRG. Regular rate and rhythm. No peripheral edema, cyanosis or pallor.  Gastrointestinal:  Soft, nondistended, nontender. No rebound or guarding. Normal bowel sounds. No appreciable masses or hepatomegaly.+G tube Rectal:  Not  performed.  Msk:  Symmetrical without gross deformities. Without edema, no deformity or joint abnormality.  Neurologic:  Alert and  oriented x4;  grossly normal neurologically.  Skin:   Dry and intact without significant lesions or rashes. Psychiatric:  Demonstrates good judgement and reason without abnormal affect or behaviors.  RELEVANT LABS AND IMAGING: CBC    Component Value Date/Time   WBC 5.3 05/24/2023 0456   RBC 3.94 05/24/2023 0456   HGB 11.6 (L) 05/24/2023 0456   HGB 12.6 10/04/2022 1638   HCT 37.7 05/24/2023 0456   HCT 37.6 10/04/2022 1638   PLT 351 05/24/2023 0456   PLT 192 10/04/2022 1638   MCV 95.7 05/24/2023 0456   MCV 94 10/04/2022 1638   MCH 29.4 05/24/2023 0456   MCHC 30.8 05/24/2023 0456   RDW 14.3 05/24/2023 0456   RDW 15.1 10/04/2022 1638   LYMPHSABS 1.8 05/24/2023 0456   LYMPHSABS 2.1 10/04/2022 1638   MONOABS 0.4 05/24/2023 0456   EOSABS 0.1 05/24/2023 0456   EOSABS 0.1 10/04/2022 1638   BASOSABS 0.0 05/24/2023 0456   BASOSABS 0.0 10/04/2022 1638    CMP     Component Value Date/Time   NA 142 05/24/2023 0456   NA 143 10/04/2022 1638   K 4.3 05/28/2023 1454   CL 103 05/24/2023 0456   CO2 29 05/24/2023 0456   GLUCOSE 85 05/24/2023 0456   BUN <5 (L) 05/24/2023 0456   BUN 12 10/04/2022 1638   CREATININE 0.51 05/24/2023 0456   CREATININE 0.74 02/14/2018 1225   CALCIUM 9.2 05/24/2023 0456   PROT 8.7 (H) 05/20/2023 2256   PROT 7.1 10/04/2022 1638   ALBUMIN 3.0 (L) 05/20/2023 2256   ALBUMIN 4.7 10/04/2022 1638   AST 23 05/20/2023 2256   AST 26 02/14/2018 1225   ALT 13 05/20/2023 2256   ALT 15 02/14/2018 1225   ALKPHOS 73 05/20/2023 2256   BILITOT 0.7 05/20/2023 2256   BILITOT 0.5 10/04/2022 1638   BILITOT 0.3 02/14/2018 1225   GFRNONAA >60 05/24/2023 0456   GFRNONAA >60 02/14/2018 1225   GFRAA 92 09/11/2018 1132   GFRAA >60 02/14/2018 1225    Assessment: 1.  Malnutrition: Status post G-tube in April, then aspiration pneumonia in  September with recommendations for n.p.o. and only tube feeds, no one is managing her for this, does have diarrhea with tube feeds managed with Imodium 2.  Status post G-tube 3.  History of adenomatous polyps: Patient is slightly overdue for repeat colonoscopy, but given her nutrition status and recent hospitalization we will hold for now  Plan: 1.  Provided the patient with the phone number of the nutritionist at [facility] that her PCP referred her to so she can follow-up with them. 2.  Also provided the patient with the phone number for speech therapy/pathology so that she can have them arrange for home health visits for further therapy per their recommendations.  This may need to go through her primary care. 3.  Discussed with the patient that we do not manage G-tubes or feedings unfortunately. 4.  Patient is cachectic and just went through a hospitalization for aspiration pneumonia.  She is in no place to have a repeat colonoscopy.  Explained that we will put her in recall for 6 months from now so that we can see how she is doing and possibly rediscuss surveillance for colon cancer.  She verbalized understanding. 5.  Patient to follow in clinic in 6 months.  Hyacinth Meeker, PA-C [facility]  06/12/2023, 9:45 AM  Cc: Hoy Register, MD

## 2023-06-12 NOTE — Patient Instructions (Signed)
Nutrition:  [phone] or [phone]  Speech Therapy:  [phone]  _______________________________________________________  If your blood pressure at your visit was 140/90 or greater, please contact your primary care physician to follow up on this.  _______________________________________________________  If you are age 54 or older, your body mass index should be between 23-30. Your Body mass index is 15.16 kg/m. If this is out of the aforementioned range listed, please consider follow up with your Primary Care Provider.  If you are age 39 or younger, your body mass index should be between 19-25. Your Body mass index is 15.16 kg/m. If this is out of the aformentioned range listed, please consider follow up with your Primary Care Provider.   ________________________________________________________  The [facility] providers would like to encourage you to use [facility] to communicate with providers for non-urgent requests or questions.  Due to long hold times on the telephone, sending your provider a message by [facility] may be a faster and more efficient way to get a response.  Please allow 48 business hours for a response.  Please remember that this is for non-urgent requests.  _______________________________________________________

## 2023-06-13 ENCOUNTER — Ambulatory Visit: Payer: Self-pay

## 2023-06-13 NOTE — Telephone Encounter (Signed)
I have placed order for modified barium study.  Alycia could you please fax the order.  Erskine Squibb could you please assist with the questions about her tube feedings?  Thank you.

## 2023-06-13 NOTE — Telephone Encounter (Signed)
Routing to PCP for review.

## 2023-06-13 NOTE — Telephone Encounter (Signed)
I returned Cynthia call to patient's step mother, Cynthia Underwood.  She expressed her frustrations regarding Cynthia patient's discharge and needs that have not been met.  She said that Cynthia patient is severely malnourished.  She received Cynthia Jevity for Cynthia tube feedings from [facility]; but is concerned that she does not receive any water or protein supplement.  I explained that free water is ordered for Cynthia prior to, and after each feeding.Cynthia Underwood was not sure if Cynthia patient was administering that. She said that Cynthia patient does not have any protein supplements ordered and she had protein supplements in Cynthia past, prior to this hospitalization and she thinks Cynthia patient  desperately needs Cynthia extra protein.  She is NPO. Cynthia Underwood  is concerned that Cynthia feeding tube is not being cared for properly. She would like a referral to a nutritionist as soon as possible  She then said that Cynthia referral to [facility] with Cynthia $400 co pay was for weight management. I told her that Cynthia order for Cynthia Barium study was placed and that I would need to check into Cynthia status of Cynthia nutrition referral.   I contacted Cynthia Underwood, referral coordinator she said that a new referral can be placed for a nutritionist  with a diagnosis other than protein calorie malnutrition. And she will see what clinics are in network.  Cynthia patient may not have a large co-pay with [facility] if there is a different diagnosis code.   I spoke to Cynthia Underwood and she confirmed that they do not have a nutritionist on staff.  I called Cynthia Underwood back and explained to her that we can try submitting another referral for Cynthia nutritionist. She is very anxious to have this in place as soon as possible. She said that Cynthia patient lives with her cousin, who is an EMT, and both Cynthia cousin and Cynthia Underwood are trying to advocate for Cynthia patient.  She also stated that Cynthia patient has been flushing Cynthia PEG tube after feedings but she is not sure how much she is administering.  I  explained that we can make a home health referral and will try to find an agency that can review Cynthia administration of enteral feeds and PEG care. Cynthia Underwood was in agreement with that plan providing Dr Cynthia Underwood is in agreement. I explained that there is no guarantee that we will be able to secure a home health agency that is in network with her insurance and has Cynthia staff available but we can try.   She spoke a number of times of Cynthia need for someone to manage/ monitor her caloric intake and make sure that she is getting Cynthia nutrition that she needs. I told her that I can reach out to nutritionist in Cynthia hospital that worked with her for additional resources.

## 2023-06-13 NOTE — Addendum Note (Signed)
Addended by: Hoy Register on: 06/13/2023 12:30 PM   Modules accepted: Orders

## 2023-06-14 ENCOUNTER — Other Ambulatory Visit: Payer: Self-pay | Admitting: Family Medicine

## 2023-06-14 MED ORDER — SPIRONOLACTONE 25 MG PO TABS: 12.5000 mg | ORAL_TABLET | Freq: Every day | ORAL | 0 refills | Status: DC

## 2023-06-14 MED ORDER — METOCLOPRAMIDE HCL 5 MG PO TABS: 5.0000 mg | ORAL_TABLET | Freq: Three times a day (TID) | ORAL | 0 refills | Status: DC

## 2023-06-14 MED ORDER — METOPROLOL TARTRATE 25 MG PO TABS: 12.5000 mg | ORAL_TABLET | Freq: Two times a day (BID) | ORAL | 0 refills | Status: DC

## 2023-06-14 NOTE — Telephone Encounter (Signed)
Message received from [facility] stating Our team is in house clinical only. Although if you reach out to our lead RD Kendell Bane she may be able help you out. [email or web address]     I will reach out to Ms Theresia Lo.

## 2023-06-14 NOTE — Telephone Encounter (Addendum)
Home health referral placed by Dr [name]. I called [facility] as they had seen her last month .  I spoke to Lucky and she said I needed to speak to [facility].  I then left a message for Clydie Braun requesting a call back.  I also faxed the referral for review to: [phone].  I called [facility] and spoke to [facility]. She requested the referral be emailed to her for review: [email or web address].  the referral was emailed as requested.  Community Messages were sent to the following individuals requesting they review the referral:  [place] [facility]/ [facility]y/ [facility]   Email sent to Kendell Bane, Lead RD,  requesting assistance with addressing family's concerns about patient's nutrition.

## 2023-06-14 NOTE — Patient Outreach (Signed)
Care Coordination   Follow Up Visit Note   06/13/2023 Name: Cynthia Underwood MRN: [id number] DOB: [birth year]  Cynthia Underwood is a 52 y.o. year old female who sees Hoy Register, MD for primary care. I spoke with sister Cynthia Underwood by phone today.  What matters to the patients health and wellness today?  Patient and family would like a new referral to a Nutritionist. Patient would like to have her swallowing re-evaluated.     Goals Addressed             This Visit's Progress    RN Care Coordination Activities: further follow up needed       Care Coordination Interventions: Received inbound call from patient's sister Cynthia Underwood Evaluation of current treatment plan related to caloric malnutrition w/G-tube and patient's adherence to plan as established by provider Determined patient completed her follow up with Gastroenterology, unfortunately this provider was unable to provide the services patient was hoping to receive including, further evaluation of her swallowing and education related to her G-tube and nutrition Reviewed and discussed with sister Cynthia Underwood, an order was placed for [facility] Nutritionist in September, this referral was noted to be declined by the patient due to having a high copay and was closed  Determined patient's copay was $400 and this was not affordable for the patient Reviewed and discussed PCP has placed an order for a barium swallow and home health  Instructed Cynthia Underwood to assist patient with contacting her health plan to inquire about in network nutritionist so a new referral can be placed [facility] on the importance of flushing patient's G-tube with water following her feedings; educated on the goal for daily water intake 48-64 oz as tolerated and unless otherwise directed  Collaborated with Cynthia Peers RN with PCP with a status update advising orders have been placed for home health, the referral coordinator will contact [facility] Nutritionist  to confirm this provider is in network for patient and if so she will resend with and updated diagnosis    Interventions Today    Flowsheet Row Most Recent Value  Chronic Disease   Chronic disease during today's visit Other  [Protein Caloric Malnutrition,  G-tube,  history of aspirational pneumonia]  General Interventions   General Interventions Discussed/Reviewed General Interventions Discussed, General Interventions Reviewed, Doctor Visits, Communication with  Doctor Visits Discussed/Reviewed Doctor Visits Discussed, Doctor Visits Reviewed, Specialist, PCP  Communication with RN  Cynthia Underwood]  Education Interventions   Education Provided Provided Education  Provided Verbal Education On Medication, When to see the doctor, Nutrition, Mental Health/Coping with Illness  Mental Health Interventions   Mental Health Discussed/Reviewed Mental Health Discussed, Mental Health Reviewed, Depression, Anxiety, Coping Strategies  Nutrition Interventions   Nutrition Discussed/Reviewed Nutrition Discussed, Nutrition Reviewed, Supplemental nutrition          SDOH assessments and interventions completed:  No     Care Coordination Interventions:  Yes, provided   Follow up plan: Follow up call scheduled for 06/15/23 @10 :00 AM    Encounter Outcome:  Patient Visit Completed

## 2023-06-14 NOTE — Telephone Encounter (Signed)
Requested medication (s) are due for refill today: routing for review  Requested medication (s) are on the active medication list: yes  Last refill:  multiple dates  Future visit scheduled: yes  Notes to clinic:  Unable to refill per protocol, last refill by another provider, cannot delegate. Routing for approval.     Requested Prescriptions  Pending Prescriptions Disp Refills   metoCLOPramide (REGLAN) 5 MG tablet      Sig: Place 1 tablet (5 mg total) into feeding tube.     Not Delegated - Gastroenterology: Antiemetics - metoclopramide Failed - 06/14/2023  9:13 AM      Failed - This refill cannot be delegated      Passed - Cr in normal range and within 360 days    Creatinine  Date Value Ref Range Status  02/14/2018 0.74 0.60 - 1.10 mg/dL Final   Creatinine, Ser  Date Value Ref Range Status  05/24/2023 0.51 0.44 - 1.00 mg/dL Final         Passed - Valid encounter within last 6 months    Recent Outpatient Visits           4 weeks ago Bronchiectasis without complication [facility])   [place] [facility] Hoy Register, [facility]   3 months ago Diarrhea, unspecified type   [facility] Hoy Register, MD   7 months ago Screening for cervical cancer   [place] [facility] Hoy Register, MD   8 months ago Sessile colonic polyp   [place] [facility] Hoy Register, MD   1 year ago Postoperative hypothyroidism   [place] [facility] Hoy Register, MD       Future Appointments             In 1 month Dewald, Bettina Gavia, MD [place] [facility] [place]   In 2 months Hoy Register, MD [facility]             digoxin (LANOXIN) 0.25 MG tablet      Sig: Place 1 tablet (250 mcg total) into feeding tube daily.     Cardiovascular:  Antiarrhythmic Agents - digoxin Passed - 06/14/2023   9:13 AM      Passed - Cr in normal range and within 180 days    Creatinine  Date Value Ref Range Status  02/14/2018 0.74 0.60 - 1.10 mg/dL Final   Creatinine, Ser  Date Value Ref Range Status  05/24/2023 0.51 0.44 - 1.00 mg/dL Final         Passed - Digoxin (serum) in normal range and within 360 days    Digoxin Level  Date Value Ref Range Status  01/22/2023 0.9 0.8 - 2.0 ng/mL Final    Comment:    Performed at [facility], [address], [place], [place] [address]         Passed - Ca in normal range and within 360 days    Calcium  Date Value Ref Range Status  05/24/2023 9.2 8.9 - 10.3 mg/dL Final   Calcium, Ion  Date Value Ref Range Status  11/18/2022 1.31 1.15 - 1.40 mmol/L Final         Passed - K in normal range and within 180 days    Potassium  Date Value Ref Range Status  05/28/2023 4.3 3.5 - 5.2 mmol/L Final  Passed - Mg Level in normal range and within 360 days    Magnesium  Date Value Ref Range Status  05/24/2023 2.0 1.7 - 2.4 mg/dL Final    Comment:    Performed at [facility], [address], [place], [place] [address]         Passed - Patient had ECG in the last 360 days      Passed - Last Heart Rate in normal range    Pulse Readings from Last 1 Encounters:  06/12/23 68         Passed - Valid encounter within last 6 months    Recent Outpatient Visits           4 weeks ago Bronchiectasis without complication [facility])   [place] [facility] Hoy Register, MD   3 months ago Diarrhea, unspecified type   [place] [facility] Hoy Register, MD   7 months ago Screening for cervical cancer   [place] [facility] Hoy Register, MD   8 months ago Sessile colonic polyp   [place] [facility] Hoy Register, MD   1 year ago Postoperative hypothyroidism   [place] [facility] Hoy Register, MD        Future Appointments             In 1 month Francine Graven, Bettina Gavia, MD [place] [place]   In 2 months Hoy Register, MD [facility]             metoprolol tartrate (LOPRESSOR) 25 MG tablet      Sig: Place 0.5 tablets (12.5 mg total) into feeding tube 2 (two) times daily.     Cardiovascular:  Beta Blockers Passed - 06/14/2023  9:13 AM      Passed - Last BP in normal range    BP Readings from Last 1 Encounters:  06/12/23 90/60         Passed - Last Heart Rate in normal range    Pulse Readings from Last 1 Encounters:  06/12/23 68         Passed - Valid encounter within last 6 months    Recent Outpatient Visits           4 weeks ago Bronchiectasis without complication (HCC)   [place] [facility] [place], Odette Horn[place], MD   3 months ago Diarrhea, unspecified type   [facility] Hoy [facility], MD   7 months ago Screening for cervical cancer   [facility] Hoy Register, MD   8 months ago Sessile colonic polyp   [place] [facility] Hoy Register, MD   1 year ago Postoperative hypothyroidism   [place] [facility] Hoy Register, MD       Future Appointments             In 1 month Dewald, Bettina Gavia, MD  [place] [facility] [place]   In 2 months Hoy Register, MD [facility]             spironolactone (ALDACTONE) 25 MG tablet      Sig: Place 0.5 tablets (12.5 mg total) into feeding tube daily.     Cardiovascular: Diuretics - Aldosterone Antagonist Passed -  06/14/2023  9:13 AM      Passed - Cr in normal range and within 180 days    Creatinine  Date Value Ref Range Status  02/14/2018 0.74 0.60 - 1.10 mg/dL Final   Creatinine, Ser  Date Value Ref Range Status  05/24/2023 0.51 0.44 - 1.00  mg/dL Final         Passed - K in normal range and within 180 days    Potassium  Date Value Ref Range Status  05/28/2023 4.3 3.5 - 5.2 mmol/L Final         Passed - Na in normal range and within 180 days    Sodium  Date Value Ref Range Status  05/24/2023 142 135 - 145 mmol/L Final  10/04/2022 143 134 - 144 mmol/L Final         Passed - eGFR is 30 or above and within 180 days    GFR, Est AFR Am  Date Value Ref Range Status  02/14/2018 >60 >60 mL/min Final    Comment:    (NOTE) The eGFR has been calculated using the CKD EPI equation. This calculation has not been validated in all clinical situations. eGFR's persistently <60 mL/min signify possible Chronic Kidney Disease.    GFR calc Af Amer  Date Value Ref Range Status  09/11/2018 92 >59 mL/min/1.73 Final   GFR, Estimated  Date Value Ref Range Status  05/24/2023 >60 >60 mL/min Final    Comment:    (NOTE) Calculated using the CKD-EPI Creatinine Equation (2021)   02/14/2018 >60 >60 mL/min Final   eGFR  Date Value Ref Range Status  10/04/2022 107 >59 mL/min/1.73 Final         Passed - Last BP in normal range    BP Readings from Last 1 Encounters:  06/12/23 90/60         Passed - Valid encounter within last 6 months    Recent Outpatient Visits           4 weeks ago Bronchiectasis without complication [facility])   [place] [facility] Hoy Register, MD   3 months ago Diarrhea, unspecified type   [place] [facility] Hoy Register, MD   7 months ago Screening for cervical cancer   [place] [facility] [place], Odette [place]s, MD   8 months ago Sessile colonic polyp   [place] [facility] Hoy Register, MD   1 year ago Postoperative hypothyroidism   [place] [facility] Hoy Register, MD       Future Appointments             In 1 month Dewald, Bettina Gavia, MD [facility] [place]  [facility]   In 2 months Hoy Register, MD [facility]

## 2023-06-14 NOTE — Telephone Encounter (Signed)
Medication Refill - Medication:  metoCLOPramide (REGLAN) 5 MG tablet  digoxin (LANOXIN) 0.25 MG tablet  metoprolol tartrate (LOPRESSOR) 25 MG tablet  spironolactone (ALDACTONE) 25 MG tablet    Has the patient contacted their pharmacy? Yes.   (Agent: If no, request that the patient contact the pharmacy for the refill. If patient does not wish to contact the pharmacy document the reason why and proceed with request.) (Agent: If yes, when and what did the pharmacy advise?)  Preferred Pharmacy (with phone number or street name):  [id number] - [place] [place] - [address] Phone: [phone]  Fax: [phone]     Has the patient been seen for an appointment in the last year OR does the patient have an upcoming appointment? Yes.    Agent: Please be advised that RX refills may take up to 3 business days. We ask that you follow-up with your pharmacy.

## 2023-06-14 NOTE — Addendum Note (Signed)
Addended by: Hoy Register on: 06/14/2023 08:07 AM   Modules accepted: Orders

## 2023-06-14 NOTE — Telephone Encounter (Signed)
Referral to nutrition and home health have been placed.

## 2023-06-14 NOTE — Patient Instructions (Signed)
Visit Information  Thank you for taking time to visit with me today. Please don't hesitate to contact me if I can be of assistance to you.   Following are the goals we discussed today:   Goals Addressed             This Visit's Progress    RN Care Coordination Activities: further follow up needed       Care Coordination Interventions: Received inbound call from patient's sister Cynthia Underwood Evaluation of current treatment plan related to caloric malnutrition w/G-tube and patient's adherence to plan as established by provider Determined patient completed her follow up with Gastroenterology, unfortunately this provider was unable to provide the services patient was hoping to receive including, further evaluation of her swallowing and education related to her G-tube and nutrition Reviewed and discussed with sister Cynthia Underwood, an order was placed for [facility] Nutritionist in September, this referral was noted to be declined by the patient due to having a high copay and was closed  Determined patient's copay was $400 and this was not affordable for the patient Reviewed and discussed PCP has placed an order for a barium swallow and home health  Instructed Cynthia Underwood to assist patient with contacting her health plan to inquire about in network nutritionist so a new referral can be placed [facility] on the importance of flushing patient's G-tube with water following her feedings; educated on the goal for daily water intake 48-64 oz as tolerated and unless otherwise directed  Collaborated with [name] RN with PCP with a status update advising orders have been placed for home health, the referral coordinator will contact [facility] Nutrition[facility] to confirm this provider is in network for patient and if so she will resend with and updated diagnosis        Our next appointment is by telephone on 06/15/23 at 10:00 AM  Please call the care guide team at [phone] if you need to cancel or  reschedule your appointment.   If you are experiencing a Mental Health or Behavioral Health Crisis or need someone to talk to, please call 1-800-273-TALK (toll free, 24 hour hotline)  Patient verbalizes understanding of instructions and care plan provided today and agrees to view in MyChart. Active MyChart status and patient understanding of how to access instructions and care plan via MyChart confirmed with patient.     [name] RN BSN CCM [place]  [facility], [facility] Nurse Care Coordinator  Direct Dial: [phone] Website: [name]

## 2023-06-14 NOTE — Telephone Encounter (Signed)
Order has been faxed

## 2023-06-14 NOTE — Telephone Encounter (Signed)
Secure chat message sent to Ricardo Jericho inquiring about assistance from a nutritionist to address family's concerns.

## 2023-06-15 ENCOUNTER — Telehealth: Payer: Self-pay | Admitting: Cardiology

## 2023-06-15 ENCOUNTER — Other Ambulatory Visit: Payer: Self-pay

## 2023-06-15 ENCOUNTER — Other Ambulatory Visit: Payer: Self-pay | Admitting: *Deleted

## 2023-06-15 ENCOUNTER — Telehealth: Payer: Self-pay | Admitting: Family Medicine

## 2023-06-15 ENCOUNTER — Other Ambulatory Visit (HOSPITAL_COMMUNITY): Payer: Self-pay | Admitting: *Deleted

## 2023-06-15 ENCOUNTER — Ambulatory Visit: Payer: Self-pay

## 2023-06-15 ENCOUNTER — Telehealth (HOSPITAL_COMMUNITY): Payer: Self-pay | Admitting: *Deleted

## 2023-06-15 DIAGNOSIS — R1310 Dysphagia, unspecified: Secondary | ICD-10-CM

## 2023-06-15 MED ORDER — DIGOXIN 250 MCG PO TABS: 250.0000 ug | ORAL_TABLET | Freq: Every day | ORAL | 0 refills | Status: DC

## 2023-06-15 NOTE — Telephone Encounter (Signed)
*  STAT* If patient is at the pharmacy, call can be transferred to refill team.   1. Which medications need to be refilled? (please list name of each medication and dose if known)   digoxin (LANOXIN) 0.25 MG tablet   2. Would you like to learn more about the convenience, safety, & potential cost savings by using the [facility]?   3. Are you open to using the [facility] (Type [facility]. ).  4. Which pharmacy/location (including street and city if local pharmacy) is medication to be sent to?  [id number] [place], [place] - [address]   5. Do they need a 30 day or 90 day supply?   30 day  Caller [name]) stated patient has been out of medication for three days.  Patient has appointment scheduled on 11/5.

## 2023-06-15 NOTE — Telephone Encounter (Signed)
Attempted to contact patient to schedule OP MBS. Left VM. RKEEL

## 2023-06-15 NOTE — Telephone Encounter (Signed)
Noted. Making CM aware.

## 2023-06-15 NOTE — Patient Instructions (Signed)
Visit Information  Thank you for taking time to visit with me today. Please don't hesitate to contact me if I can be of assistance to you.   Following are the goals we discussed today:   Goals Addressed             This Visit's Progress    RN Care Coordination Activities: further follow up needed   On track    Care Coordination Interventions: Completed an inbound call with patient, cousin [name] and sister [name] Evaluation of current treatment plan related to caloric malnutrition w/G-tube and patient's adherence to plan as established by provider Reviewed medications with patient and discussed importance of medication adherence Determined patient is out of her Digoxin and this medication was last filled [facility] Sent secure message to PCP, [name] who advised patient will need to contact her Cardiologist to refill this medication, notified cousin [name] to advise Sent patient a my chart message to advise she should contact Dr. Alford Highland office to request an in person follow up visit and to let the office staff know she is out of her Digoxin and needs a refill  Reiterated the importance to patient, cousin and sister [name], patient should contact her health plan to inquire about in network nutritionist so a new referral can be placed Educated patient on the importance of flushing patient's G-tube with water following her feedings; educated on the goal for daily water intake 48-64 oz as tolerated and unless otherwise directed  Notified patient [facility] cannot provide skilled nursing due to staffing, sent message to [name] and CMA Harle Stanford requesting the referral be sent to [facility] as patient has used this [facility] in the past, sent additional message to [name] RN      To schedule an in person visit with Cardiology       Care Coordination Interventions: Instructed patient's cousin [name] to schedule an in person follow up visit with Dr. Shirlee Latch  patient's cardiologist ASAP and to let the office staff know patient is currently out of her Digoxin Advised [name] will not refill this mediation due to Cardiology should manage and [name] verbalizes understanding         Our next appointment is by telephone on 06/29/23 at 2:00 PM  Please call the care guide team at [phone] if you need to cancel or reschedule your appointment.   If you are experiencing a Mental Health or Behavioral Health Crisis or need someone to talk to, please call 1-800-273-TALK (toll free, 24 hour hotline)  Patient verbalizes understanding of instructions and care plan provided today and agrees to view in MyChart. Active MyChart status and patient understanding of how to access instructions and care plan via MyChart confirmed with patient.     [name] RN BSN CCM [place]  [facility], [facility] Nurse Care Coordinator  Direct Dial: [phone] Website: [email or web address]

## 2023-06-15 NOTE — Telephone Encounter (Signed)
Ordered updated to modified barium swallow study.  As requested.

## 2023-06-15 NOTE — Telephone Encounter (Signed)
Message received from [place] stating they are not able to accept the referral.

## 2023-06-15 NOTE — Telephone Encounter (Signed)
Danisha calling from [facility] is calling to report that they will have to decline the referral for HH/Nursing due to staffing. [phone]

## 2023-06-15 NOTE — Patient Outreach (Signed)
Care Coordination   Follow Up Visit Note   06/15/2023 Name: Cynthia Underwood MRN: [id number] DOB: 1971/07/22  Cynthia Underwood is a 52 y.o. year old female who sees Hoy Register, MD for primary care. I spoke with  Cynthia Underwood by phone today.  What matters to the patients health and wellness today?  Patient will contact her health plan to ask for in network Nutritionist. She will schedule an in person visit with her Cardiologist and request a refill for Digoxin. Patient will increase her water intake via her G-tube as directed.     Goals Addressed             This Visit's Progress    RN Care Coordination Activities: further follow up needed   On track    Care Coordination Interventions: Completed an inbound call with patient, cousin Cynthia Underwood and sister Cynthia Underwood Evaluation of current treatment plan related to caloric malnutrition w/G-tube and patient's adherence to plan as established by provider Reviewed medications with patient and discussed importance of medication adherence Determined patient is out of her Digoxin and this medication was last filled [facility] Sent secure message to PCP, [name] who advised patient will need to contact her Cardiologist to refill this medication, notified cousin Cynthia Underwood to advise Sent patient a my chart message to advise she should contact Dr. Alford Highland office to request an in person follow up visit and to let the office staff know she is out of her Digoxin and needs a refill  Reiterated the importance to patient, cousin and sister Cynthia Underwood, patient should contact her health plan to inquire about in network nutritionist so a new referral can be placed Educated patient on the importance of flushing patient's G-tube with water following her feedings; educated on the goal for daily water intake 48-64 oz as tolerated and unless otherwise directed  Notified patient Cynthia Underwood cannot provide skilled nursing due to staffing, sent message to  [name] and CMA Harle Stanford requesting the referral be sent to [facility] as patient has used this [facility] in the past, sent additional message to Cynthia Peers RN     To schedule an in person visit with Cardiology       Care Coordination Interventions: Instructed patient's cousin Cynthia Underwood to schedule an in person follow up visit with Dr. Shirlee Latch patient's cardiologist ASAP and to let the office staff know patient is currently out of her Digoxin Advised [name] will not refill this mediation due to Cardiology should manage and Cynthia Underwood verbalizes understanding     Interventions Today    Flowsheet Row Most Recent Value  Chronic Disease   Chronic disease during today's visit Other  [malnutrion,  G-tube]  General Interventions   General Interventions Discussed/Reviewed General Interventions Discussed, General Interventions Reviewed, Doctor Visits, Communication with  Doctor Visits Discussed/Reviewed Doctor Visits Discussed, Doctor Visits Reviewed, PCP, Specialist  Communication with RN  Cynthia Peers RN]  Education Interventions   Education Provided Provided Education  Provided Verbal Education On When to see the doctor, Medication  Nutrition Interventions   Nutrition Discussed/Reviewed Nutrition Reviewed, Nutrition Discussed, Fluid intake  [tube feeding]  Pharmacy Interventions   Pharmacy Dicussed/Reviewed Pharmacy Topics Discussed, Pharmacy Topics Reviewed, Medications and their functions, Medication Adherence          SDOH assessments and interventions completed:  No     Care Coordination Interventions:  Yes, provided   Follow up plan: Follow up call scheduled for 06/29/23 @2 :00 PM  Encounter Outcome:  Patient Visit Completed

## 2023-06-15 NOTE — Telephone Encounter (Signed)
Amber with [facility] at [facility] called and stated that they are unable to accept the order as written. The order needs to state the diagnosis and modified barium swallow study.

## 2023-06-17 NOTE — Telephone Encounter (Signed)
noted 

## 2023-06-17 NOTE — Telephone Encounter (Signed)
Message received from Angie Coley/[facility] stating they are not able to accept the referral

## 2023-06-18 ENCOUNTER — Ambulatory Visit (HOSPITAL_COMMUNITY)
Admission: RE | Admit: 2023-06-18 | Discharge: 2023-06-18 | Disposition: A | Discharge status: Home / Self Care | Payer: 59 | Source: Ambulatory Visit | Attending: Family Medicine | Admitting: Family Medicine

## 2023-06-18 ENCOUNTER — Telehealth: Payer: Self-pay | Admitting: Family Medicine

## 2023-06-18 ENCOUNTER — Other Ambulatory Visit: Payer: Self-pay

## 2023-06-18 DIAGNOSIS — J189 Pneumonia, unspecified organism: Secondary | ICD-10-CM | POA: Insufficient documentation

## 2023-06-18 DIAGNOSIS — R1314 Dysphagia, pharyngoesophageal phase: Secondary | ICD-10-CM | POA: Diagnosis not present

## 2023-06-18 DIAGNOSIS — R1310 Dysphagia, unspecified: Secondary | ICD-10-CM

## 2023-06-18 NOTE — Telephone Encounter (Signed)
Call received from Alisa/[facility] stating they are declining referral due to not in network

## 2023-06-18 NOTE — Telephone Encounter (Signed)
[name] called stated [facility] can no longer service patient due to staffing. She also stated the will not fill the metoprolol tartrate (LOPRESSOR) 25 MG tablet as it needs an auth done by provider/ Please f/u with pharmacy

## 2023-06-18 NOTE — Telephone Encounter (Signed)
Message received from [facility] statin g they are not able to accept the referral

## 2023-06-18 NOTE — Telephone Encounter (Signed)
Message received from [place], [facility] stating they are not able to accept the referral.   9 agencies have been contacted and none are able to accept the referral

## 2023-06-18 NOTE — Telephone Encounter (Signed)
I spoke to [facility] who requested referral be faxed for review: [phone].  I spoke to [place]/ [facility] who also requested referral be faxed for review: [phone]  In addition I spoke to Clydie Braun / [facility] who wanted referral faxed for review: [phone].  Referrals then faxed as requested.  I spoke to [place] [facility] who declined the referral.

## 2023-06-18 NOTE — Progress Notes (Signed)
Modified Barium Swallow Study  Patient Details  Name: Cynthia Underwood MRN: [id number] Date of Birth: 1970-11-10  Today's Date: 06/18/2023  Modified Barium Swallow completed.  Full report located under Chart Review in the Imaging Section.  History of Present Illness 52 yr old seen for outpatient MBS accompanied by her cousin with history of myotonic dystrophy, severe dysphagia- previous MBS's recommended NPO with water protocol. Pt has PEG tube and states she has been eating puree foods at home that cousin prepares. Repeat MBS for possible initiation of po.   Clinical Impression Pt continues to exhibit signifcant pharyngoesophageal dysphagia marked by reduced tongue base retraction, decreased laryngeal elevation, no epiglottic deflection and reduced anterior hyoid excursion. Majority of boluses remained in valleculae and pyriform sinuses with reduced UES opening. Thin liquid with teaspoon penetrated near cord level, cups sip thin penetrated after the swallow (PAS 3), nectar thick penetrated from residue and puree while performing a chin tuck spilled into airway (PAS 3). Pt did throat clear several times throughout study, however overall sensation was reduced. She demonstrated significant difficulty initiating a second swallow stating that her mouth was dry in attempts to clear residue. A left and right head turn and dry swallows were not effective. Esophageal scan revealed stasis in distal esophagus. Recommend pt continue NPO status and use PEG for nutrition. She may have sips of thin water (only) after oral care, however pt states that she does not like water. She also states that she gets hungry despite her tube feedings. Pt and cousin report she has not been having home health ST and SLP recommends home health ST to work on pharyngeal strength. Factors that may increase risk of adverse event in presence of aspiration Rubye O[employer] Clearance Coots 2021): Weak cough;Frail or deconditioned  Swallow Evaluation  Recommendations Recommendations: NPO;Free water protocol after oral care Medication Administration: Via alternative means Oral care recommendations: Oral care QID (4x/day)      Royce Macadamia 06/18/2023,1:06 PM

## 2023-06-18 NOTE — Telephone Encounter (Signed)
Call received from Cheryl/[facility] stating they are not able to accept the referral

## 2023-06-18 NOTE — Telephone Encounter (Signed)
I spoke to [place] who is the patient's cousin and who lives with her and is the primary caregiver.  The patient gave me permission to speak with [name], who is an EMT.  She explained that the patient knows how to do the tube feedings and she [name]) does too. However, with Melinda's work schedule she is not home for all of the feeding times. She said that they do not need a home health nurse for the tube feeds/PEG care. She said that the patient is just not motivated to do the feedings, she just wants to sleep.  I explained that I have tried to find a home health agency for skilled nursing services; but as of now, 8 of 9 home health agencies have declined the referral.  [name] said that was okay, they don't need nursing.   She said that the patient went for the barium swallow today and ST is recommended. Melinda contacted [facility] who was servicing the patient prior to this hospitalization and they told her that they cannot accept the referral.  [name] said that she would prefer a referral to outpatient ST.  She explained that the patient needs to be motivated to get up and get dressed and get moving. She thinks that outpatient ST would be the best option.  I told her that Dr [name] would consider that after she receives results of the study.    [name] said that they are most concerned about the patient seeing a nutritionist. She said that she received a list of 4 nutritionists in the patient's insurance network but the soonest the patient could be seen in the end of January 2025.  I told her that Dr [name] placed a referral for nutritionist and our referral coordinator is following up with that.    [name] spoke again about the patient's lack of desire to help herself and she thinks the patient is experiencing some depression.  [name] would like her to speak with our clinical SW.  Toni Amend- can you please call [name] and the patient?  Thanks  S

## 2023-06-18 NOTE — Telephone Encounter (Signed)
I spoke to Karen/ [facility] and they are not able to accept the referral

## 2023-06-19 NOTE — Telephone Encounter (Signed)
Per Cheryll Dessert, Referral coordinator, she referred the patient to [facility] nutritionist and this was their response to her  Spoke with the patient who states that she does not have CKD or DM and cannot afford out of pocket cost - she asked for the referral to be closed 05/17/23 KB    At the recommendation of Kendell Bane, Lead RD at [facility]. I have reached out to [facility] to inquire if they offer the services of a nutritionist for patient's receiving enteral feedings.  Email sent to  [facility]. RD/ Clinical Liaison : [phone]

## 2023-06-20 NOTE — Telephone Encounter (Signed)
ofcourse.

## 2023-06-21 DIAGNOSIS — I5043 Acute on chronic combined systolic (congestive) and diastolic (congestive) heart failure: Secondary | ICD-10-CM | POA: Diagnosis not present

## 2023-06-21 DIAGNOSIS — J9601 Acute respiratory failure with hypoxia: Secondary | ICD-10-CM | POA: Diagnosis not present

## 2023-06-22 ENCOUNTER — Telehealth: Payer: Self-pay

## 2023-06-22 DIAGNOSIS — E43 Unspecified severe protein-calorie malnutrition: Secondary | ICD-10-CM | POA: Diagnosis not present

## 2023-06-22 DIAGNOSIS — J690 Pneumonitis due to inhalation of food and vomit: Secondary | ICD-10-CM | POA: Diagnosis not present

## 2023-06-22 DIAGNOSIS — R1310 Dysphagia, unspecified: Secondary | ICD-10-CM | POA: Diagnosis not present

## 2023-06-22 DIAGNOSIS — I5043 Acute on chronic combined systolic (congestive) and diastolic (congestive) heart failure: Secondary | ICD-10-CM | POA: Diagnosis not present

## 2023-06-22 DIAGNOSIS — Z931 Gastrostomy status: Secondary | ICD-10-CM | POA: Diagnosis not present

## 2023-06-22 NOTE — Telephone Encounter (Signed)
Copied from CRM [id number]. Topic: General - Inquiry >> Jun 22, 2023  1:49 PM Marlow Baars wrote: Reason for CRM: [facility] from [facility] called in stating they need an updated prescription specifically stating eval & titrate pt for poc for portable oxygen container if qualifies dispense at 1-6 pulse dose. Please fax to [phone]. Please assist patient further

## 2023-06-26 MED ORDER — MISC. DEVICES MISC: 0 refills | Status: AC

## 2023-06-26 NOTE — Telephone Encounter (Signed)
Routing to PCP for review.

## 2023-06-26 NOTE — Addendum Note (Signed)
Addended by: Hoy Register on: 06/26/2023 06:22 PM   Modules accepted: Orders

## 2023-06-26 NOTE — Telephone Encounter (Signed)
Done

## 2023-06-27 ENCOUNTER — Telehealth: Payer: Self-pay | Admitting: Licensed Clinical Social Worker

## 2023-06-27 NOTE — Telephone Encounter (Signed)
Patient was contacted and has an appointment on 11/6. patient recently had an appointment earlier in the month with LCSWA to address her depression however patient no showed. Hope to see pt at her new appt.

## 2023-06-28 NOTE — Telephone Encounter (Signed)
Noted  

## 2023-06-28 NOTE — Telephone Encounter (Signed)
Order has been faxed

## 2023-06-29 ENCOUNTER — Ambulatory Visit: Payer: Self-pay

## 2023-06-29 NOTE — Patient Instructions (Signed)
Visit Information  Thank you for taking time to visit with me today. Please don't hesitate to contact me if I can be of assistance to you.   Following are the goals we discussed today:   Goals Addressed             This Visit's Progress    To be able to eat and drink normally and regain my speech   On track    Care Coordination Interventions: Completed outbound call with patient and sister Evadean Savaria  Evaluation of current treatment plan related to Impaired Speech and Swallowing and patient's adherence to plan as established by provider Discussed and reviewed with sister [name], patient completed her barium swallow as directed Review of patient status, including review of consultant's reports, relevant laboratory and other test results, and medications completed Determined patient has been turned down by 8-9 home health agencies, however she prefers to attend outpatient ST Sent in basket message to [name] requesting a referral for outpatient ST per recommendations following her barium swallow Determined patient is unable to get in to see a Nutritionist until January 2025, encouraged sister to ask to have patient added to a cancellation list for an earlier appointment         Our next appointment is by telephone on 07/13/23 at 09:30 AM  Please call the care guide team at [phone] if you need to cancel or reschedule your appointment.   If you are experiencing a Mental Health or Behavioral Health Crisis or need someone to talk to, please call 1-800-273-TALK (toll free, 24 hour hotline)  Patient verbalizes understanding of instructions and care plan provided today and agrees to view in MyChart. Active MyChart status and patient understanding of how to access instructions and care plan via MyChart confirmed with patient.     [name] RN BSN CCM [place]  [facility], [facility] Health Nurse Care Coordinator  Direct Dial: [phone] Website:  [email or web address]

## 2023-06-29 NOTE — Patient Outreach (Signed)
Care Coordination   Follow Up Visit Note   06/29/2023 Name: Cynthia Underwood MRN: [id number] DOB: 07-21-1971  Cynthia Underwood is a 52 y.o. year old female who sees Hoy Register, MD for primary care. I spoke with sister Cynthia Underwood by phone today.  What matters to the patients health and wellness today?  Patient would like a referral to outpatient ST.     Goals Addressed             This Visit's Progress    To be able to eat and drink normally and regain my speech   On track    Care Coordination Interventions: Completed outbound call with patient and sister Cynthia Underwood  Evaluation of current treatment plan related to Impaired Speech and Swallowing and patient's adherence to plan as established by provider Discussed and reviewed with sister Cynthia Underwood, patient completed her barium swallow as directed Review of patient status, including review of consultant's reports, relevant laboratory and other test results, and medications completed Determined patient has been turned down by 8-9 home health agencies, however she prefers to attend outpatient ST Sent in basket message to [name] requesting a referral for outpatient ST per recommendations following her barium swallow Determined patient is unable to get in to see a Nutritionist until January 2025, encouraged sister to ask to have patient added to a cancellation list for an earlier appointment     Interventions Today    Flowsheet Row Most Recent Value  Chronic Disease   Chronic disease during today's visit Other  [malnutrition,  G-tube,  dysphagia]  General Interventions   General Interventions Discussed/Reviewed General Interventions Discussed, General Interventions Reviewed, Doctor Visits, Communication with  Doctor Visits Discussed/Reviewed Doctor Visits Reviewed, Doctor Visits Discussed, Specialist  Communication with PCP/Specialists  [Dr. Newlin]  Education Interventions   Education Provided Provided Education   Provided Verbal Education On Nutrition, When to see the doctor  Nutrition Interventions   Nutrition Discussed/Reviewed Nutrition Discussed, Nutrition Reviewed, Fluid intake, Supplemental nutrition, Increasing proteins          SDOH assessments and interventions completed:  No     Care Coordination Interventions:  Yes, provided   Follow up plan: Follow up call scheduled for 07/13/23 @09 :30 AM    Encounter Outcome:  Patient Visit Completed

## 2023-07-02 ENCOUNTER — Other Ambulatory Visit: Payer: Self-pay | Admitting: Family Medicine

## 2023-07-02 DIAGNOSIS — J690 Pneumonitis due to inhalation of food and vomit: Secondary | ICD-10-CM

## 2023-07-02 DIAGNOSIS — Z931 Gastrostomy status: Secondary | ICD-10-CM

## 2023-07-10 ENCOUNTER — Ambulatory Visit: Payer: 59 | Attending: Internal Medicine | Admitting: Internal Medicine

## 2023-07-10 ENCOUNTER — Encounter: Payer: Self-pay | Admitting: Internal Medicine

## 2023-07-10 VITALS — BP 94/66 | HR 115 | Ht 63.0 in | Wt 87.0 lb

## 2023-07-10 DIAGNOSIS — Z950 Presence of cardiac pacemaker: Secondary | ICD-10-CM

## 2023-07-10 DIAGNOSIS — I441 Atrioventricular block, second degree: Secondary | ICD-10-CM

## 2023-07-10 NOTE — Patient Instructions (Signed)
Medication Instructions:  Your physician has recommended you make the following change in your medication:   ** Stop Digoxin    *If you need a refill on your cardiac medications before your next appointment, please call your pharmacy*   Lab Work: None ordered.  If you have labs (blood work) drawn today and your tests are completely normal, you will receive your results only by: MyChart Message (if you have MyChart) OR A paper copy in the mail If you have any lab test that is abnormal or we need to change your treatment, we will call you to review the results.   Testing/Procedures: None ordered.    Follow-Up: At [facility], you and your health needs are our priority.  As part of our continuing mission to provide you with exceptional heart care, we have created designated Provider Care Teams.  These Care Teams include your primary Cardiologist (physician) and Advanced Practice Providers (APPs -  Physician Assistants and Nurse Practitioners) who all work together to provide you with the care you need, when you need it.  We recommend signing up for the patient portal called "MyChart".  Sign up information is provided on this After Visit Summary.  MyChart is used to connect with patients for Virtual Visits (Telemedicine).  Patients are able to view lab/test results, encounter notes, upcoming appointments, etc.  Non-urgent messages can be sent to your provider as well.   To learn more about what you can do with MyChart, go to [email or web address].    Your next appointment:   12 months with Dr Graciela Husbands

## 2023-07-10 NOTE — Progress Notes (Signed)
Patient Care Team: Hoy Register, MD as PCP - General (Family Medicine) Quintella Reichert, MD as PCP - Cardiology (Cardiology) Duke Salvia, MD as PCP - Electrophysiology (Cardiology) Glendale Chard, DO as Consulting Physician (Neurology)   HPI  Cynthia Underwood is a 52 y.o. 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 but following the sepsis episode there is interval recovery  Chronic dyspnea and on O2  feeding tube  Date Cr K Hgb  9/24 0.51 4.1 11.6           Date % Vp LVEF  3/21 7-50%    9/22 71% 60-65  3/23 100%    12/.23` 80% 20%  9/24  55-60%     Records and Results Reviewed   Past Medical History:  Diagnosis Date   Bifascicular block 10/02/2014   Cataract    Dyspnea    Excess ear wax    Multinodular goiter    Pneumonia    Presence of permanent cardiac pacemaker    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   Sickle cell trait (HCC)    Watery eyes    Left   Weakness of both legs     Past Surgical History:  Procedure Laterality Date   BREAST BIOPSY Right    EYE SURGERY Left 2017   "related to blockage in my nose"   EYE SURGERY Right 05/2019   INSERT / REPLACE / REMOVE PACEMAKER  02/15/2017   IR GASTROSTOMY TUBE MOD SED  01/01/2023   PACEMAKER IMPLANT N/A 02/15/2017   Procedure: Pacemaker Implant;  Surgeon: Duke Salvia, MD;  [facility] INVASIVE CV LAB;  Service: Cardiovascular;  Laterality: N/A;   RIGHT/LEFT HEART CATH AND CORONARY ANGIOGRAPHY N/A 11/16/2022   Procedure: RIGHT/LEFT HEART CATH AND CORONARY ANGIOGRAPHY;  Surgeon: Laurey Morale, MD;  Location: [facility]  INVASIVE CV LAB;  Service: Cardiovascular;  Laterality: N/A;   THYROIDECTOMY N/A 04/21/2021   Procedure: TOTAL THYROIDECTOMY;  Surgeon: Axel Filler, MD;  Location: [facility] OR;  Service: General;  Laterality: N/A;    Current Outpatient Medications  Medication Sig Dispense Refill   acetaminophen (TYLENOL) 500 MG tablet Place 500 mg into feeding tube every 6 (six) hours as needed for mild pain.     digoxin (LANOXIN) 0.25 MG tablet Take 1 tablet (250 mcg total) by mouth daily. Please keep upcoming 07/10/23 appointment for continued refills. Thank you 30 tablet 0   guaifenesin (HUMIBID E) 400 MG TABS tablet 400 mg 3 (three) times daily. Place 400mg  into feeding tube three times a day     hydrocortisone-pramoxine (ANALPRAM HC) 2.5-1 % rectal cream Place 1 Application rectally 3 (three) times daily. 30 g 3   ipratropium-albuterol (DUONEB) 0.5-2.5 (3) MG/3ML SOLN Inhale 3 mLs into the lungs every 4 (four) hours as needed. SMARTSIG:1 Unspecified Via Inhaler Every 4 Hours PRN 360 mL 3   levothyroxine (SYNTHROID) 100 MCG tablet Place 1 tablet (100 mcg total) into feeding tube daily before breakfast. 90 tablet 0   metoCLOPramide (REGLAN) 5 MG tablet Place 1 tablet (5 mg total) into feeding tube 3 (three) times daily before meals. 30 tablet 0   metoprolol tartrate (  LOPRESSOR) 25 MG tablet Place 0.5 tablets (12.5 mg total) into feeding tube 2 (two) times daily. 180 tablet 0   Misc. Devices MISC Eval & titrate pt for poc for portable oxygen container. If patient qualifies, dispense at 1-6 pulse dose. Please fax to [phone]. Diagnosis-aspiration pneumonia 1 each 0   Nutritional Supplements (FEEDING SUPPLEMENT, JEVITY 1.5 CAL/FIBER,) LIQD Place 237 mLs into feeding tube 4 (four) times daily. FWF before and after each feeding 1000 mL 30   pantoprazole (PROTONIX) 40 MG tablet 40 mg daily. Place 40mg  into the feeding tube daily     QUEtiapine (SEROQUEL) 25 MG tablet Place 1 tablet (25 mg total) into feeding  tube at bedtime.     scopolamine (TRANSDERM-SCOP) 1 MG/3DAYS Place 1 patch onto the skin every 3 (three) days.     spironolactone (ALDACTONE) 25 MG tablet Place 0.5 tablets (12.5 mg total) into feeding tube daily. 90 tablet 0   Water For Irrigation, Sterile (FREE WATER) SOLN free water via tube before and after each feeding     No current facility-administered medications for this visit.    Allergies  Allergen Reactions   Penicillin G Nausea And Vomiting, Rash and Other (See Comments)    Sore throat Has had cephalosporins in past   Penicillins Nausea And Vomiting, Rash and Other (See Comments)    Fever Has patient had a PCN reaction causing immediate rash, facial/tongue/throat swelling, SOB or lightheadedness with hypotension: Yes Has patient had a PCN reaction causing severe rash involving mucus membranes or skin necrosis: Unknown Has patient had a PCN reaction that required hospitalization: No Has patient had a PCN reaction occurring within the last 10 years: No If all of the above answers are "NO", then may proceed with Cephalosporin use.       Review of Systems negative except from HPI and PMH  Physical Exam BP 94/66   Pulse (!) 115   Ht 5\' 3"  (1.6 m)   Wt 87 lb (39.5 kg)   LMP 12/03/2016 (Approximate)   SpO2 94%   BMI 15.41 kg/m  Cachectic and emaciated AA female wearing O2  HENT normal Neck supple   Clear with decreased breath sounds  Device pocket well healed; without hematoma or erythema.  There is no tethering  Regular rate and rhythm, no murmur Abd-soft with feeding tube in place No Clubbing cyanosis  edema Skin-warm and dry A & Oriented  Grossly normal sensory and motor function  ECG As P-synchronous/ AV  pacing  24/15/35  Device function is normal. Programming changes AV delay shorted 280/250>>200/180  See Paceart for details    Assessment and  Plan Myotonic dystrophy  Sinus node dysfunction  MBZ 2 AV block  Pacer Medtronic HIS Bundle  with septal capture     Patient's pacemaker is functioning appropriately.  Shorten the AV delay to improve AV hemodynamics.    Current medicines are reviewed at length with the patient today .  The patient does not  have concerns regarding medicines.

## 2023-07-11 ENCOUNTER — Ambulatory Visit: Payer: 59 | Attending: Licensed Clinical Social Worker | Admitting: Licensed Clinical Social Worker

## 2023-07-11 DIAGNOSIS — F320 Major depressive disorder, single episode, mild: Secondary | ICD-10-CM

## 2023-07-11 NOTE — BH Specialist Note (Signed)
Integrated Behavioral Health Initial In-Person Visit  MRN: [id number] Name: Cynthia Underwood  Number of Integrated Behavioral Health Clinician visits: 1- Initial Visit  Session Start time: 1605    Session End time: 1655  Total time in minutes: 50   Types of Service: Individual psychotherapy and Introduction only  Interpretor:No. Interpretor Name and Language: n/a  Subjective: Cynthia Underwood is a 52 y.o. female accompanied by  herself. Patient was referred by PCP for depression. Patient reports the following symptoms/concerns: lack of sleep,sadness, hunger Duration of problem: for about 2 years; Severity of problem: moderate  Objective: Mood: Depressed and Hopeless and Affect: Depressed and Tearful Risk of harm to self or others: No plan to harm self or others  Life Context: Family and Social: pt lives with her cousin, who often helps her and take her to appointments. School/Work: not able to work. Self-Care: watching movies Life Changes: started oxygen, limited to doing things, feeding tub   Patient and/or Family's Strengths/Protective Factors: Concrete supports in place (healthy food, safe environments, etc.)  Goals Addressed: Patient will: Reduce symptoms of: depression and insomnia Increase knowledge and/or ability of: coping skills and self-management skills  Demonstrate ability to: Increase healthy adjustment to current life circumstances  Progress towards Goals: Ongoing  Interventions: Interventions utilized: Supportive Counseling  Standardized Assessments completed: GAD-7 and PHQ 9  Patient and/or Family Response: pt shared that she gets bored often, she feels that she is a burden on her family. Struggle to sleep due to hungry and is unable to eat due to her feeding tube. Feelings of grief of there old self.  Patient Centered Plan: Patient is on the following Treatment Plan(s):  Depression  Assessment: Patient currently experiencing  lack of  sleep,sadness, hunger.   Patient may benefit from referral to specialist and more time around family and friends.  Plan: Follow up with behavioral health clinician on : 4 weeks Behavioral recommendations: spending more time with family. Referral to specialist about eating Referral(s): Integrated [employer] (In Clinic) "From scale of 1-10, how likely are you to follow plan?": not sure  Vassie Loll, LCSWA

## 2023-07-12 ENCOUNTER — Telehealth: Payer: Self-pay

## 2023-07-12 ENCOUNTER — Telehealth (INDEPENDENT_AMBULATORY_CARE_PROVIDER_SITE_OTHER): Payer: Self-pay | Admitting: Licensed Clinical Social Worker

## 2023-07-12 NOTE — Telephone Encounter (Signed)
I spoke to [facility] and she confirmed that the patient received a POC 06/22/2023.

## 2023-07-12 NOTE — Telephone Encounter (Signed)
Hey, met with this pt yesterday. Pt shared concerns about not being able to sleep due to hunger and would like a second opinion from a speech/language pathology to see about eating soft foods again. Please advise. Thank you!

## 2023-07-13 ENCOUNTER — Other Ambulatory Visit: Payer: Self-pay | Admitting: Cardiology

## 2023-07-13 ENCOUNTER — Ambulatory Visit: Payer: Self-pay

## 2023-07-13 NOTE — Patient Outreach (Addendum)
  Care Coordination   Follow Up Visit Note   07/13/2023 Name: Cynthia Underwood MRN: [id number] DOB: December 31, 1970  Cynthia Underwood is a 52 y.o. year old female who sees Hoy Register, MD for primary care. I spoke with sister Alyna Mcnaney by phone today.  What matters to the patients health and wellness today?  Patient would like a second opinion for her inability to eat soft foods. Patient would like to establish with outpatient ST and RD.     Goals Addressed             This Visit's Progress    To be able to eat and drink normally and regain my speech   On track    Care Coordination Interventions: Completed outbound call with patient and sister Sadhana Cintora  Evaluation of current treatment plan related to Impaired Speech and Swallowing and patient's adherence to plan as established by provider Discussed and reviewed initial outpatient ST is scheduled for 08/10/23 @11 :00 AM, with Link Snuffer CCC-SLP at [facility]      To establish with registered dietician for management of protein/caloric intake   On track    Care Coordination Interventions: Completed an inbound call with patient, cousin [name] and sister [name] Evaluation of current treatment plan related to caloric malnutrition w/G-tube and patient's adherence to plan as established by provider Reviewed and discussed with sister patient's initial consult with registered dietician is scheduled in January 2025 Determined patient continues to administer Jevity via PEG tube, sister may consider having patient add supplemental protein such as Boost or Ensure to help with caloric/protein and reduce hunger     COMPLETED: To schedule an in person visit with Cardiology       Care Coordination Interventions: Evaluation of current treatment plan related to Mobitz type 2 second degree heart block; pacemaker and patient's adherence to plan as established by provider Determined patient completed her Cardiology follow up call as  directed  Reviewed medication change to discontinue digoxin per Cardiology Discussed patient's next scheduled follow up is recommended for 1 year or sooner if needed    Interventions Today    Flowsheet Row Most Recent Value  Chronic Disease   Chronic disease during today's visit Other  [myotonic dystrophy,  dysphagia]  General Interventions   General Interventions Discussed/Reviewed General Interventions Discussed, General Interventions Reviewed, Doctor Visits  Doctor Visits Discussed/Reviewed Doctor Visits Discussed, Doctor Visits Reviewed, Specialist  Education Interventions   Education Provided Provided Education  Provided Verbal Education On Nutrition, When to see the doctor, Mental Health/Coping with Illness  Mental Health Interventions   Mental Health Discussed/Reviewed Mental Health Discussed, Mental Health Reviewed, Coping Strategies  Nutrition Interventions   Nutrition Discussed/Reviewed Nutrition Discussed, Nutrition Reviewed, Increasing proteins, Supplemental nutrition          SDOH assessments and interventions completed:  No     Care Coordination Interventions:  Yes, provided   Follow up plan: Follow up call scheduled for 08/16/23 @2 :00 PM    Encounter Outcome:  Patient Visit Completed

## 2023-07-13 NOTE — Telephone Encounter (Signed)
I had referred her to outpatient speech therapy and when they see her, they will provide recommendations based on her recent barium swallow.  Thanks.

## 2023-07-13 NOTE — Patient Instructions (Signed)
Visit Information  Thank you for taking time to visit with me today. Please don't hesitate to contact me if I can be of assistance to you.   Following are the goals we discussed today:   Goals Addressed             This Visit's Progress    To be able to eat and drink normally and regain my speech   On track    Care Coordination Interventions: Completed outbound call with patient and sister Juhi Tieu  Evaluation of current treatment plan related to Impaired Speech and Swallowing and patient's adherence to plan as established by provider Discussed and reviewed initial outpatient ST is scheduled for 08/10/23 @11 :00 AM, with Link Snuffer CCC-SLP at [facility]      To establish with registered dietician for management of protein/caloric intake   On track    Care Coordination Interventions: Completed an inbound call with patient, cousin [name] and sister [name] Evaluation of current treatment plan related to caloric malnutrition w/G-tube and patient's adherence to plan as established by provider Reviewed and discussed with sister patient's initial consult with registered dietician is scheduled in January 2025 Determined patient continues to administer Jevity via PEG tube, sister may consider having patient add supplemental protein such as Boost or Ensure to help with caloric/protein and reduce hunger     COMPLETED: To schedule an in person visit with Cardiology       Care Coordination Interventions: Evaluation of current treatment plan related to Mobitz type 2 second degree heart block; pacemaker and patient's adherence to plan as established by provider Determined patient completed her Cardiology follow up call as directed  Reviewed medication change to discontinue digoxin per Cardiology Discussed patient's next scheduled follow up is recommended for 1 year or sooner if needed           Our next appointment is by telephone on 08/16/23 at 2:00 PM  Please call the care  guide team at [phone] if you need to cancel or reschedule your appointment.   If you are experiencing a Mental Health or Behavioral Health Crisis or need someone to talk to, please call 1-800-273-TALK (toll free, 24 hour hotline)  Patient verbalizes understanding of instructions and care plan provided today and agrees to view in MyChart. Active MyChart status and patient understanding of how to access instructions and care plan via MyChart confirmed with patient.     [name] RN BSN CCM [place]  [facility], [facility] Nurse Care Coordinator  Direct Dial: [phone] Website: [email or web address]

## 2023-07-18 NOTE — Telephone Encounter (Signed)
Thank you :)

## 2023-07-19 ENCOUNTER — Ambulatory Visit (HOSPITAL_COMMUNITY)
Admission: RE | Admit: 2023-07-19 | Discharge: 2023-07-19 | Disposition: A | Discharge status: Home / Self Care | Payer: 59 | Source: Ambulatory Visit | Attending: Pulmonary Disease | Admitting: Pulmonary Disease

## 2023-07-19 DIAGNOSIS — R590 Localized enlarged lymph nodes: Secondary | ICD-10-CM | POA: Diagnosis not present

## 2023-07-19 DIAGNOSIS — J479 Bronchiectasis, uncomplicated: Secondary | ICD-10-CM | POA: Diagnosis not present

## 2023-07-22 DIAGNOSIS — I5043 Acute on chronic combined systolic (congestive) and diastolic (congestive) heart failure: Secondary | ICD-10-CM | POA: Diagnosis not present

## 2023-07-22 DIAGNOSIS — J9601 Acute respiratory failure with hypoxia: Secondary | ICD-10-CM | POA: Diagnosis not present

## 2023-07-23 ENCOUNTER — Ambulatory Visit (INDEPENDENT_AMBULATORY_CARE_PROVIDER_SITE_OTHER): Payer: 59 | Admitting: Pulmonary Disease

## 2023-07-23 ENCOUNTER — Encounter: Payer: Self-pay | Admitting: Pulmonary Disease

## 2023-07-23 VITALS — BP 94/58 | HR 92 | Temp 97.2°F | Ht 63.0 in | Wt 90.2 lb

## 2023-07-23 DIAGNOSIS — J471 Bronchiectasis with (acute) exacerbation: Secondary | ICD-10-CM | POA: Diagnosis not present

## 2023-07-23 MED ORDER — DOXYCYCLINE MONOHYDRATE 25 MG/5ML PO SUSR
100.0000 mg | Freq: Two times a day (BID) | ORAL | 0 refills | Status: DC
Start: 2023-07-23 — End: 2023-08-22

## 2023-07-23 NOTE — Progress Notes (Unsigned)
Synopsis: Referred in August 2024 for hospital follow up  Subjective:   PATIENT ID: Cynthia Underwood GENDER: female DOB: Oct 07, 1970, MRN: [id number]  HPI  Chief Complaint  Patient presents with   Follow-up    Cough and SOB are unchanged. She is producing some green sputum.    Cynthia Underwood is a 52 year old woman, former smoker with myotonic dystrophy, second degree heart block s/p pacemaker, HFrEF (EF 20%), goiter s/p thyroidectomy who is referred to pulmonary clinic for hospital follow up of pneumonia.   Initial OV 04/18/23 Patient has had 3 admissions this year for respiratory failure due to pneumonia and sepsis. She has dysphagia with PEG tube in place but developed recurrent pneumonias in setting of aspiration. She is currently doing better from a respiratory standpoint but continues to have cough and mucous production. Denies and fevers, chills or sweats. She has not been able to gain weight back since being in the hospital. She is doing bolus feeding via PEG tube. She is not following with a nutritionist/dietician. She is accompanied by her cousin.   She quit smoking in 2018. She lives with her cousin.  Today - 07/23/23 The patient, with a history of bronchiectasis and aspiration issues, presents with an increase in coughing and fatigue for the past couple of weeks. The patient had a swallow study done last month, which recommended tube feeds. A recent CT scan shows some improvement in the scarring at the bottom of the lungs, but there is permanent scarring present. The patient is currently using a nebulizer and a flutter valve for treatment. The patient has not been on any antibiotics recently and denies having any fevers or chills. The patient also denies having any sinus congestion or drainage.   Past Medical History:  Diagnosis Date   Bifascicular block 10/02/2014   Cataract    Dyspnea    Excess ear wax    Multinodular goiter    Pneumonia    Presence of permanent  cardiac pacemaker    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   Sickle cell trait (HCC)    Watery eyes    Left   Weakness of both legs      Family History  Problem Relation Age of Onset   Cancer Father 18       Deceased   Heart disease Father    COPD Mother    Diabetes Maternal Uncle    Heart disease Maternal Uncle    Heart disease Paternal Grandmother    Diabetes Paternal Grandmother    Hypertension Paternal Grandmother    Neuromuscular disorder Maternal Aunt        Myotonic dystrophy   Neuromuscular disorder Cousin        Myotonic dystrophy   Neuromuscular disorder Sister        Myotonic dystrophy   Colon cancer Neg Hx    Colon polyps Neg Hx    Esophageal cancer Neg Hx    Rectal cancer Neg Hx    Stomach cancer Neg Hx      Social History   Socioeconomic History   Marital status: Single    Spouse name: Not on file   Number of children: 0   Years of education: 1.5 Collge   Highest education level: Not on file  Occupational History   Occupation: Unemployed    Employer: OTHER  Tobacco Use   Smoking status: Former    Current packs/day: 0.00    Types: Cigarettes  Start date: 10/04/1988    Quit date: 10/04/2016    Years since quitting: 6.8   Smokeless tobacco: Never  Vaping Use   Vaping status: Never Used  Substance and Sexual Activity   Alcohol use: Not Currently    Alcohol/week: 0.0 standard drinks of alcohol    Comment:  few times a year   Drug use: Not Currently    Types: Marijuana    Comment: occ    Sexual activity: Not Currently    Birth control/protection: None, Condom  Other Topics Concern   Not on file  Social History Narrative   Patient lives at home with her aunt.   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  She moved to [facility] in August 2015.   She has been on disability since 2010.   Education: 1 1/2 years of college.   Caffeine - coke 2 cans/day   Exercise - no   Social  Determinants of Health   Financial Resource Strain: Low Risk  (10/16/2022)   Overall Financial Resource Strain (CARDIA)    Difficulty of Paying Living Expenses: Not hard at all  Food Insecurity: Food Insecurity Present (05/21/2023)   Hunger Vital Sign    Worried About Running Out of Food in the Last Year: Sometimes true    Ran Out of Food in the Last Year: Sometimes true  Transportation Needs: No Transportation Needs (05/21/2023)   PRAPARE - Administrator, Civil Service (Medical): No    Lack of Transportation (Non-Medical): No  Physical Activity: Inactive (10/16/2022)   Exercise Vital Sign    Days of Exercise per Week: 0 days    Minutes of Exercise per Session: 0 min  Stress: Stress Concern Present (10/16/2022)   [employer] of Occupational Health - Occupational Stress Questionnaire    Feeling of Stress : To some extent  Social Connections: Socially Isolated (10/16/2022)   Social Connection and Isolation Panel [NHANES]    Frequency of Communication with Friends and Family: Three times a week    Frequency of Social Gatherings with Friends and Family: Three times a week    Attends Religious Services: Never    Active Member of Clubs or Organizations: No    Attends Banker Meetings: Never    Marital Status: Never married  Intimate Partner Violence: Not At Risk (05/21/2023)   Humiliation, Afraid, Rape, and Kick questionnaire    Fear of Current or Ex-Partner: No    Emotionally Abused: No    Physically Abused: No    Sexually Abused: No     Allergies  Allergen Reactions   Penicillin G Nausea And Vomiting, Rash and Other (See Comments)    Sore throat Has had cephalosporins in past   Penicillins Nausea And Vomiting, Rash and Other (See Comments)    Fever Has patient had a PCN reaction causing immediate rash, facial/tongue/throat swelling, SOB or lightheadedness with hypotension: Yes Has patient had a PCN reaction causing severe rash involving mucus membranes  or skin necrosis: Unknown Has patient had a PCN reaction that required hospitalization: No Has patient had a PCN reaction occurring within the last 10 years: No If all of the above answers are "NO", then may proceed with Cephalosporin use.      Outpatient Medications Prior to Visit  Medication Sig Dispense Refill   acetaminophen (TYLENOL) 500 MG tablet Place 500 mg into feeding tube every 6 (six) hours as needed for mild pain.     guaifenesin (HUMIBID E) 400  MG TABS tablet 400 mg 3 (three) times daily. Place 400mg  into feeding tube three times a day     hydrocortisone-pramoxine (ANALPRAM HC) 2.5-1 % rectal cream Place 1 Application rectally 3 (three) times daily. 30 g 3   ipratropium-albuterol (DUONEB) 0.5-2.5 (3) MG/3ML SOLN Inhale 3 mLs into the lungs every 4 (four) hours as needed. SMARTSIG:1 Unspecified Via Inhaler Every 4 Hours PRN 360 mL 3   levothyroxine (SYNTHROID) 100 MCG tablet Place 1 tablet (100 mcg total) into feeding tube daily before breakfast. 90 tablet 0   metoCLOPramide (REGLAN) 5 MG tablet Place 1 tablet (5 mg total) into feeding tube 3 (three) times daily before meals. 30 tablet 0   metoprolol tartrate (LOPRESSOR) 25 MG tablet Place 0.5 tablets (12.5 mg total) into feeding tube 2 (two) times daily. 180 tablet 0   Misc. Devices MISC Eval & titrate pt for poc for portable oxygen container. If patient qualifies, dispense at 1-6 pulse dose. Please fax to [phone]. Diagnosis-aspiration pneumonia 1 each 0   Nutritional Supplements (FEEDING SUPPLEMENT, JEVITY 1.5 CAL/FIBER,) LIQD Place 237 mLs into feeding tube 4 (four) times daily. FWF before and after each feeding 1000 mL 30   pantoprazole (PROTONIX) 40 MG tablet 40 mg daily. Place 40mg  into the feeding tube daily     QUEtiapine (SEROQUEL) 25 MG tablet Place 1 tablet (25 mg total) into feeding tube at bedtime.     scopolamine (TRANSDERM-SCOP) 1 MG/3DAYS Place 1 patch onto the skin every 3 (three) days.      spironolactone (ALDACTONE) 25 MG tablet Place 0.5 tablets (12.5 mg total) into feeding tube daily. 90 tablet 0   Water For Irrigation, Sterile (FREE WATER) SOLN free water via tube before and after each feeding     No facility-administered medications prior to visit.    Review of Systems  Constitutional:  Positive for malaise/fatigue. Negative for chills, fever and weight loss.  HENT:  Negative for congestion, sinus pain and sore throat.   Eyes: Negative.   Respiratory:  Positive for cough, sputum production and shortness of breath. Negative for hemoptysis and wheezing.   Cardiovascular:  Negative for chest pain, palpitations, orthopnea, claudication and leg swelling.  Gastrointestinal:  Negative for abdominal pain, heartburn, nausea and vomiting.  Genitourinary: Negative.   Musculoskeletal:  Negative for joint pain and myalgias.  Skin:  Negative for rash.  Neurological:  Negative for weakness.  Endo/Heme/Allergies: Negative.   Psychiatric/Behavioral: Negative.     Objective:   Vitals:   07/23/23 1616  BP: (!) 94/58  Pulse: 92  Temp: (!) 97.2 F (36.2 C)  TempSrc: Oral  SpO2: 96%  Weight: 90 lb 3.2 oz (40.9 kg)  Height: 5\' 3"  (1.6 m)    Physical Exam Constitutional:      General: She is not in acute distress.    Appearance: She is underweight.  Eyes:     General: No scleral icterus.    Conjunctiva/sclera: Conjunctivae normal.  Cardiovascular:     Rate and Rhythm: Normal rate and regular rhythm.  Pulmonary:     Breath sounds: No wheezing, rhonchi or rales.  Abdominal:     Comments: PEG in place  Musculoskeletal:     Right lower leg: No edema.     Left lower leg: No edema.  Skin:    General: Skin is warm and dry.  Neurological:     General: No focal deficit present.    CBC    Component Value Date/Time   WBC 5.3  05/24/2023 0456   RBC 3.94 05/24/2023 0456   HGB 11.6 (L) 05/24/2023 0456   HGB 12.6 10/04/2022 1638   HCT 37.7 05/24/2023 0456   HCT 37.6  10/04/2022 1638   PLT 351 05/24/2023 0456   PLT 192 10/04/2022 1638   MCV 95.7 05/24/2023 0456   MCV 94 10/04/2022 1638   MCH 29.4 05/24/2023 0456   MCHC 30.8 05/24/2023 0456   RDW 14.3 05/24/2023 0456   RDW 15.1 10/04/2022 1638   LYMPHSABS 1.8 05/24/2023 0456   LYMPHSABS 2.1 10/04/2022 1638   MONOABS 0.4 05/24/2023 0456   EOSABS 0.1 05/24/2023 0456   EOSABS 0.1 10/04/2022 1638   BASOSABS 0.0 05/24/2023 0456   BASOSABS 0.0 10/04/2022 1638      Latest Ref Rng & Units 05/28/2023    2:54 PM 05/24/2023    4:56 AM 05/23/2023    9:44 AM  BMP  Glucose 70 - 99 mg/dL  85  86   BUN 6 - 20 mg/dL  <5  <5   Creatinine 6.04 - 1.00 mg/dL  5.40  9.81   Sodium 191 - 145 mmol/L  142  138   Potassium 3.5 - 5.2 mmol/L 4.3  4.1  3.2   Chloride 98 - 111 mmol/L  103  102   CO2 22 - 32 mmol/L  29  29   Calcium 8.9 - 10.3 mg/dL  9.2  8.5    Chest imaging: CT Chest 07/19/23 Progressive bronchiectasis and what appears to be marked worsening of chronic post infectious or inflammatory scarring in the lower lobes of the lungs bilaterally, as above. Given the dependent nature of the findings, clinical correlation for signs and symptoms of recurrent aspiration should be considered.  CXR 01/27/23 Bilateral lower lobe collapse, with slightly improved aeration in the periphery of the right lower lobe from prior. Possible small right pleural effusion.  CT Abd/pelvis 01/23/23 Lower chest: There is persistent dense collapse and consolidation of the lower lobes bilaterally with marked volume loss. Again noted is bronchiectasis and multifocal airway impaction within the collapsed segments. While this appears stable since prior examination, this is new since remote prior examination of 11/11/2022. Pacemaker leads noted within the right heart. Cardiac size within normal limits.  CT Abd 12/26/22 Lower chest: Substantial airspace opacities and bronchiectasis in both lower lobes with associated volume loss.  There is also atelectasis in the right middle lobe cylindrical bronchiectasis. Dual lead pacer noted. A tube in the esophagus appears to terminate in the distal esophagus.  PFT:     No data to display          Labs:  Path:  Echo:  Heart Catheterization:  Speech Evaluation 01/25/23 Clinical Impression: Pt presents with a severe dysphagia that is primarily related to her dx of myotonic dystrophy type 1 with potential secondary effects from her 2022 total thyroidectomy. The swallow is impacted by nearly absent pharyngeal stripping wave and minimal base-of-tongue retraction. There is reduced laryngeal elevation. These factors lead to minimal movement of POs through the pharynx. She was able to protect her airway surprisingly well, with only high/transient penetration of thin liquids into the laryngeal vestibule, no aspiration, but repetitive effort and subswallows necessary in order to transfer small portions of a single bolus through her UES. There was almost total stasis of purees in pharynx, requiring oral suctioning and great effort to expectorate/remove barium. Continue NPO andTF as primary source of nutrition; allow small sips of water/ice chips per pt's preferences. With therapy, she may  be able to drink larger volumes of thin liquid in the future. SLP will follow while admiited; she will need intensive therapy after discharg      Assessment & Plan:   Bronchiectasis with (acute) exacerbation (HCC) - Plan: doxycycline (VIBRAMYCIN) 25 MG/5ML SUSR  Discussion: Cynthia Underwood is a 52 year old woman, former smoker with myotonic dystrophy, second degree heart block s/p pacemaker, HFrEF (EF 20%), goiter s/p thyroidectomy who returns to pulmonary clinic for bronchiectasis.  She has history of recurrent pneumonia due to dysphagia leading to aspiration. She currently has PEG tube and is using tube feeds.   Her bronchiectasis due to infections and aspiration has improved at the bases, but  she does have permanent scarring with areas of bronchiectasis.   She appears to have possible exacerbation of her bronchiectasis with increased cough, sputum production and fatigue.   She is to start doxyccyline for 14 days.  She is to use duonebs 2-3 times per day followed by flutter valve therapy for bronchial hygiene. She can use incentive spirometer 2-3 times per day.   Follow up in 4-6 months.   Melody Comas, MD [facility] Office: [phone]   Current Outpatient Medications:    acetaminophen (TYLENOL) 500 MG tablet, Place 500 mg into feeding tube every 6 (six) hours as needed for mild pain., Disp: , Rfl:    doxycycline (VIBRAMYCIN) 25 MG/5ML SUSR, Place 20 mLs (100 mg total) into feeding tube 2 (two) times daily., Disp: 540 mL, Rfl: 0   guaifenesin (HUMIBID E) 400 MG TABS tablet, 400 mg 3 (three) times daily. Place 400mg  into feeding tube three times a day, Disp: , Rfl:    hydrocortisone-pramoxine (ANALPRAM HC) 2.5-1 % rectal cream, Place 1 Application rectally 3 (three) times daily., Disp: 30 g, Rfl: 3   ipratropium-albuterol (DUONEB) 0.5-2.5 (3) MG/3ML SOLN, Inhale 3 mLs into the lungs every 4 (four) hours as needed. SMARTSIG:1 Unspecified Via Inhaler Every 4 Hours PRN, Disp: 360 mL, Rfl: 3   levothyroxine (SYNTHROID) 100 MCG tablet, Place 1 tablet (100 mcg total) into feeding tube daily before breakfast., Disp: 90 tablet, Rfl: 0   metoCLOPramide (REGLAN) 5 MG tablet, Place 1 tablet (5 mg total) into feeding tube 3 (three) times daily before meals., Disp: 30 tablet, Rfl: 0   metoprolol tartrate (LOPRESSOR) 25 MG tablet, Place 0.5 tablets (12.5 mg total) into feeding tube 2 (two) times daily., Disp: 180 tablet, Rfl: 0   Misc. Devices MISC, Eval & titrate pt for poc for portable oxygen container. If patient qualifies, dispense at 1-6 pulse dose. Please fax to [phone]. Diagnosis-aspiration pneumonia, Disp: 1 each, Rfl: 0   Nutritional Supplements (FEEDING  SUPPLEMENT, JEVITY 1.5 CAL/FIBER,) LIQD, Place 237 mLs into feeding tube 4 (four) times daily. FWF before and after each feeding, Disp: 1000 mL, Rfl: 30   pantoprazole (PROTONIX) 40 MG tablet, 40 mg daily. Place 40mg  into the feeding tube daily, Disp: , Rfl:    QUEtiapine (SEROQUEL) 25 MG tablet, Place 1 tablet (25 mg total) into feeding tube at bedtime., Disp: , Rfl:    scopolamine (TRANSDERM-SCOP) 1 MG/3DAYS, Place 1 patch onto the skin every 3 (three) days., Disp: , Rfl:    spironolactone (ALDACTONE) 25 MG tablet, Place 0.5 tablets (12.5 mg total) into feeding tube daily., Disp: 90 tablet, Rfl: 0   Water For Irrigation, Sterile (FREE WATER) SOLN, free water via tube before and after each feeding, Disp: , Rfl:

## 2023-07-23 NOTE — Patient Instructions (Signed)
Use nebulizer treatment every 4-6 hours  Use flutter valve 2-3 times per day to help clear mucous from airways  Your Chest CT looks slightly improved from earlier this year but there is scarring and bronchiectasis of the lower lobes  Follow up in 4-6 months

## 2023-07-25 ENCOUNTER — Encounter: Payer: Self-pay | Admitting: Pulmonary Disease

## 2023-08-01 ENCOUNTER — Ambulatory Visit: Payer: Self-pay

## 2023-08-01 NOTE — Patient Outreach (Signed)
  Care Coordination   Follow Up Visit Note   08/01/2023 Name: HEAHTER BILEK MRN: [id number] DOB: 1971-05-23  Elder Love Herrero is a 52 y.o. year old female who sees Hoy Register, MD for primary care. I spoke with  Elder Love Fiola by phone today.  What matters to the patients health and wellness today?  Patient/caregiver would like to receive a new shipment of Jevity for patient's tube feedings.     Goals Addressed             This Visit's Progress    To establish with registered dietician for management of protein/caloric intake   On track    Care Coordination Interventions: Received inbound call from cousin [name]  Evaluation of current treatment plan related to caloric malnutrition w/G-tube and patient's adherence to plan as established by provider Determined patient is running low on her Jevity tube feedings, the patient/caregiver have been unsuccessful with attempted communications with patient's PCP and or the supplier for her tube feedings Placed outbound joint call to [employer] [facility] for assistance with refilling patient's Jevity, spoke with [facility] regarding patient's refill for this solution  Confirmed the order has been placed and will be expedited with an expected delivery date of 09/02/23 per [employer] Discussed for future orders the patient/caregiver can contact the Nutritional department directly by calling [phone] opt 1     Interventions Today    Flowsheet Row Most Recent Value  Chronic Disease   Chronic disease during today's visit Other  [dysphagia]  General Interventions   General Interventions Discussed/Reviewed General Interventions Discussed, General Interventions Reviewed, Communication with  Communication with --  [[employer] [facility]]  Education Interventions   Education Provided Provided Education  Provided Verbal Education On Nutrition  Nutrition Interventions   Nutrition Discussed/Reviewed Nutrition Discussed, Nutrition Reviewed, Supplemental  nutrition  [PEG tube feedings - Jevity]          SDOH assessments and interventions completed:  No     Care Coordination Interventions:  Yes, provided   Follow up plan: Follow up call scheduled for 08/16/23 @2 :00 PM    Encounter Outcome:  Patient Visit Completed

## 2023-08-01 NOTE — Patient Instructions (Signed)
Visit Information  Thank you for taking time to visit with me today. Please don't hesitate to contact me if I can be of assistance to you.   Following are the goals we discussed today:   Goals Addressed             This Visit's Progress    To establish with registered dietician for management of protein/caloric intake   On track    Care Coordination Interventions: Received inbound call from cousin [name]  Evaluation of current treatment plan related to caloric malnutrition w/G-tube and patient's adherence to plan as established by provider Determined patient is running low on her Jevity tube feedings, the patient/caregiver have been unsuccessful with attempted communications with patient's PCP and or the supplier for her tube feedings Placed outbound joint call to [employer] [facility] for assistance with refilling patient's Jevity, spoke with [facility] regarding patient's refill for this solution  Confirmed the order has been placed and will be expedited with an expected delivery date of 09/02/23 per [employer] Discussed for future orders the patient/caregiver can contact the Nutritional department directly by calling [phone] opt 1         Our next appointment is by telephone on 08/16/23 at 2:00 PM  Please call the care guide team at [phone] if you need to cancel or reschedule your appointment.   If you are experiencing a Mental Health or Behavioral Health Crisis or need someone to talk to, please call 1-800-273-TALK (toll free, 24 hour hotline)  Patient verbalizes understanding of instructions and care plan provided today and agrees to view in MyChart. Active MyChart status and patient understanding of how to access instructions and care plan via MyChart confirmed with patient.     [name] RN BSN CCM [place]  [facility], [facility] Health Nurse Care Coordinator  Direct Dial: [phone] Website: [email or web address]

## 2023-08-05 DIAGNOSIS — G931 Anoxic brain damage, not elsewhere classified: Secondary | ICD-10-CM | POA: Diagnosis not present

## 2023-08-05 DIAGNOSIS — R1310 Dysphagia, unspecified: Secondary | ICD-10-CM | POA: Diagnosis not present

## 2023-08-05 DIAGNOSIS — E43 Unspecified severe protein-calorie malnutrition: Secondary | ICD-10-CM | POA: Diagnosis not present

## 2023-08-05 DIAGNOSIS — Z931 Gastrostomy status: Secondary | ICD-10-CM | POA: Diagnosis not present

## 2023-08-05 DIAGNOSIS — I5043 Acute on chronic combined systolic (congestive) and diastolic (congestive) heart failure: Secondary | ICD-10-CM | POA: Diagnosis not present

## 2023-08-07 NOTE — Therapy (Unsigned)
OUTPATIENT SPEECH LANGUAGE PATHOLOGY SWALLOW EVALUATION   Patient Name: Cynthia Underwood MRN: [id number] DOB:[birth year], 52 y.o., female Today's Date: 08/07/2023  PCP: Hoy Register, MD REFERRING PROVIDER: Hoy Register, MD  END OF SESSION:   Past Medical History:  Diagnosis Date   Bifascicular block 10/02/2014   Cataract    Dyspnea    Excess ear wax    Multinodular goiter    Pneumonia    Presence of permanent cardiac pacemaker    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   Sickle cell trait (HCC)    Watery eyes    Left   Weakness of both legs    Past Surgical History:  Procedure Laterality Date   BREAST BIOPSY Right    EYE SURGERY Left 2017   "related to blockage in my nose"   EYE SURGERY Right 05/2019   INSERT / REPLACE / REMOVE PACEMAKER  02/15/2017   IR GASTROSTOMY TUBE MOD SED  01/01/2023   PACEMAKER IMPLANT N/A 02/15/2017   Procedure: Pacemaker Implant;  Surgeon: Duke Salvia, MD;  Location: [facility] INVASIVE CV LAB;  Service: Cardiovascular;  Laterality: N/A;   RIGHT/LEFT HEART CATH AND CORONARY ANGIOGRAPHY N/A 11/16/2022   Procedure: RIGHT/LEFT HEART CATH AND CORONARY ANGIOGRAPHY;  Surgeon: Laurey Morale, MD;  Location: [facility] INVASIVE CV LAB;  Service: Cardiovascular;  Laterality: N/A;   THYROIDECTOMY N/A 04/21/2021   Procedure: TOTAL THYROIDECTOMY;  Surgeon: Axel Filler, MD;  Location: [facility] OR;  Service: General;  Laterality: N/A;   Patient Active Problem List   Diagnosis Date Noted   Aspiration pneumonia (HCC) 05/21/2023   S/P percutaneous endoscopic gastrostomy (PEG) tube placement (HCC) 03/13/2023   Diarrhea 01/23/2023   SIRS (systemic inflammatory response syndrome) (HCC) 01/23/2023   Chronic respiratory failure with hypoxia (HCC) 01/23/2023   Dysphagia 01/23/2023   Chronic systolic CHF (congestive heart failure) (HCC) 01/23/2023   Acute on chronic respiratory failure with hypoxia (HCC) 12/07/2022   Tracheostomy  status (HCC) 12/07/2022   Generalized anxiety disorder 12/06/2022   Acute systolic heart failure (HCC) 11/26/2022   Drug-induced torsades de pointes (HCC) 11/17/2022   Acute systolic CHF (congestive heart failure) (HCC) 11/16/2022   Acute hypoxemic respiratory failure (HCC) 11/15/2022   Acute on chronic combined systolic and diastolic CHF (congestive heart failure) (HCC) 11/15/2022   Protein-calorie malnutrition, severe 11/15/2022   Cardiac arrest (HCC) 11/14/2022   Healthcare-associated pneumonia 11/11/2022   Hypothyroidism 11/07/2021   Thyroid nodule 04/21/2021   S/P total thyroidectomy 04/21/2021   Sinus node dysfunction (HCC) 03/10/2020   Weakness of both legs    Watery eyes    Sickle cell trait (HCC)    Presence of permanent cardiac pacemaker    Excess ear wax    Depression    Anemia    Cardiac pacemaker in situ 03/01/2017   Mobitz type 2 second degree heart block 02/15/2017   Symptomatic advanced heart block 02/12/2017   SOB (shortness of breath) 10/02/2014   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   Bifascicular block 10/02/2014   Infection due to trichomonas vaginalis 09/15/2014   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  Rationale for Evaluation and Treatment: {HABREHAB:27488}  SUBJECTIVE:   SUBJECTIVE STATEMENT: ***  Pt accompanied by: {accompnied:27141}  PERTINENT HISTORY: myotonic dystophy, hypothyroidism, anxiety. PEG dependence.   PAIN:  Are you having pain? {OPRCPAIN:27236}  FALLS: Has patient fallen in last 6 months?  {YQIHKVQQ:59563}  LIVING ENVIRONMENT: Lives with: {OPRC lives with:25569::"lives with their family"} Lives in: {Lives  in:25570}  PLOF:  Level of assistance: {OVFIEPP:29518} Employment: {SLPemployment:25674}  PATIENT GOALS: ***  OBJECTIVE:  Note: Objective measures were completed at Evaluation unless otherwise noted. OBJECTIVE:   INSTRUMENTAL SWALLOW STUDY FINDINGS (MBSS) 06/18/2023 Clinical Impression Pt continues to exhibit signifcant pharyngoesophageal dysphagia marked by reduced tongue base retraction, decreased laryngeal elevation, no epiglottic deflection and reduced anterior hyoid excursion. Majority of boluses remained in valleculae and pyriform sinuses with reduced UES opening. Thin liquid with teaspoon penetrated near cord level, cups sip thin penetrated after the swallow (PAS 3), nectar thick penetrated from residue and puree while performing a chin tuck spilled into airway (PAS 3). Pt did throat clear several times throughout study, however overall sensation was reduced. She demonstrated significant difficulty initiating a second swallow stating that her mouth was dry in attempts to clear residue. A left and right head turn and dry swallows were not effective. Esophageal scan revealed stasis in distal esophagus. Recommend pt continue NPO status and use PEG for nutrition. She may have sips of thin water (only) after oral care, however pt states that she does not like water. She also states that she gets hungry despite her tube feedings. Pt and cousin report she has not been having home health ST and SLP recommends home health ST to work on pharyngeal strength.  COGNITION: Overall cognitive status: {cognition:24006} Areas of impairment:  {cognitiveimpairmentslp:27409} Functional deficits: ***  SUBJECTIVE DYSPHAGIA REPORTS:  Date of onset: *** Reported symptoms: {dysphagia symptoms:29766}  Current diet: {slpdiet:27196}  Co-morbid voice changes: {yes/no:20286}  FACTORS WHICH MAY INCREASE RISK OF ADVERSE EVENT IN PRESENCE OF ASPIRATION:  General health: {CSE general health:29764}  Risk factors:  {CSE risk factors:29763}    ORAL MOTOR EXAMINATION: Overall status: {OMESLP2:27645} Comments: ***  CLINICAL SWALLOW ASSESSMENT:   Dentition: {CSE dentition:29769} Vocal quality at baseline: {VQL:27192} Patient directly observed with POs: {POobserved:27199} Feeding: {slp feeding:27200} Liquids provided by: {SLPliquids:27201} [facility] Swallow Protocol: {Pass/Fail (Optional):210140017} Oral phase signs and symptoms: {SLPoralphase:27202} Pharyngeal phase signs and symptoms: {SLPpharyngealphase:27203}  PATIENT REPORTED OUTCOME MEASURES (PROM): {SLPPROM:27095}   TODAY'S TREATMENT:                                                                                                                                         DATE: ***  PATIENT EDUCATION: Education details: *** Person educated: {Person educated:25204} Education method: {Education Method:25205} Education comprehension: {Education Comprehension:25206}   ASSESSMENT:  CLINICAL IMPRESSION: Patient is a *** y.o. *** who was seen today for ***.   OBJECTIVE IMPAIRMENTS: include {SLPOBJIMP:27107}. These impairments are limiting patient from {SLPLIMIT:27108}. Factors affecting potential to achieve goals and functional outcome are {SLP potential:25450}. Patient  will benefit from skilled SLP services to address above impairments and improve overall function.  REHAB POTENTIAL: {rehabpotential:25112}   GOALS: Goals reviewed with patient? {yes/no:20286}  SHORT TERM GOALS: Target date: ***  *** Baseline: Goal status: INITIAL  2.  *** Baseline:  Goal status: INITIAL  3.  *** Baseline:  Goal status: INITIAL  4.  *** Baseline:  Goal status: INITIAL  5.  *** Baseline:  Goal status: INITIAL  6.  *** Baseline:  Goal status: INITIAL  LONG TERM GOALS: Target date: ***  *** Baseline:  Goal status: INITIAL  2.  *** Baseline:  Goal status: INITIAL  3.  *** Baseline:  Goal status: INITIAL  4.  *** Baseline:  Goal  status: INITIAL  5.  *** Baseline:  Goal status: INITIAL  6.  *** Baseline:  Goal status: INITIAL  PLAN:  SLP FREQUENCY: {rehab frequency:25116}  SLP DURATION: {rehab duration:25117}  PLANNED INTERVENTIONS: {SLP treatment/interventions:25449}    Maia Breslow, CCC-SLP 08/07/2023, 11:37 AM

## 2023-08-09 ENCOUNTER — Encounter (HOSPITAL_COMMUNITY): Payer: Self-pay

## 2023-08-09 ENCOUNTER — Emergency Department (HOSPITAL_COMMUNITY)
Admission: EM | Admit: 2023-08-09 | Discharge: 2023-08-10 | Disposition: A | Discharge status: Home / Self Care | Payer: 59

## 2023-08-09 ENCOUNTER — Emergency Department (HOSPITAL_COMMUNITY): Payer: 59

## 2023-08-09 ENCOUNTER — Other Ambulatory Visit: Payer: Self-pay

## 2023-08-09 DIAGNOSIS — N281 Cyst of kidney, acquired: Secondary | ICD-10-CM | POA: Diagnosis not present

## 2023-08-09 DIAGNOSIS — N2889 Other specified disorders of kidney and ureter: Secondary | ICD-10-CM | POA: Diagnosis not present

## 2023-08-09 DIAGNOSIS — Z961 Presence of intraocular lens: Secondary | ICD-10-CM | POA: Diagnosis not present

## 2023-08-09 DIAGNOSIS — R109 Unspecified abdominal pain: Secondary | ICD-10-CM | POA: Diagnosis not present

## 2023-08-09 DIAGNOSIS — K9423 Gastrostomy malfunction: Secondary | ICD-10-CM | POA: Diagnosis not present

## 2023-08-09 DIAGNOSIS — T85848A Pain due to other internal prosthetic devices, implants and grafts, initial encounter: Secondary | ICD-10-CM | POA: Insufficient documentation

## 2023-08-09 DIAGNOSIS — H524 Presbyopia: Secondary | ICD-10-CM | POA: Diagnosis not present

## 2023-08-09 LAB — COMPREHENSIVE METABOLIC PANEL
ALT: 21 U/L (ref 0–44)
AST: 34 U/L (ref 15–41)
Albumin: 3.6 g/dL (ref 3.5–5.0)
Alkaline Phosphatase: 51 U/L (ref 38–126)
Anion gap: 9 (ref 5–15)
BUN: 12 mg/dL (ref 6–20)
CO2: 28 mmol/L (ref 22–32)
Calcium: 9.6 mg/dL (ref 8.9–10.3)
Chloride: 104 mmol/L (ref 98–111)
Creatinine, Ser: 0.54 mg/dL (ref 0.44–1.00)
GFR, Estimated: >60 mL/min (ref >60)
Glucose, Bld: 103 mg/dL — ABNORMAL HIGH (ref 70–99)
Potassium: 4.8 mmol/L (ref 3.5–5.1)
Sodium: 141 mmol/L (ref 135–145)
Total Bilirubin: 0.9 mg/dL (ref <1.2)
Total Protein: 7.5 g/dL (ref 6.5–8.1)

## 2023-08-09 LAB — CBC WITH DIFFERENTIAL/PLATELET
Abs Immature Granulocytes: 0.03 10*3/uL (ref 0.00–0.07)
Basophils Absolute: 0 10*3/uL (ref 0.0–0.1)
Basophils Relative: 0 %
Eosinophils Absolute: 0.1 10*3/uL (ref 0.0–0.5)
Eosinophils Relative: 1 %
HCT: 43.1 % (ref 36.0–46.0)
Hemoglobin: 14 g/dL (ref 12.0–15.0)
Immature Granulocytes: 0 %
Lymphocytes Relative: 20 %
Lymphs Abs: 1.9 10*3/uL (ref 0.7–4.0)
MCH: 31.5 pg (ref 26.0–34.0)
MCHC: 32.5 g/dL (ref 30.0–36.0)
MCV: 96.9 fL (ref 80.0–100.0)
Monocytes Absolute: 0.4 10*3/uL (ref 0.1–1.0)
Monocytes Relative: 5 %
Neutro Abs: 6.9 10*3/uL (ref 1.7–7.7)
Neutrophils Relative %: 74 %
Platelets: UNDETERMINED 10*3/uL (ref 150–400)
RBC: 4.45 MIL/uL (ref 3.87–5.11)
RDW: 14.5 % (ref 11.5–15.5)
WBC: 9.3 10*3/uL (ref 4.0–10.5)
nRBC: 0 % (ref 0.0–0.2)

## 2023-08-09 MED ORDER — IPRATROPIUM-ALBUTEROL 0.5-2.5 (3) MG/3ML IN SOLN
3.0000 mL | Freq: Once | RESPIRATORY_TRACT | Status: AC
  Administered 2023-08-09: 3 mL via RESPIRATORY_TRACT
  Filled 2023-08-09: qty 3

## 2023-08-09 MED ORDER — IOHEXOL 300 MG/ML  SOLN
100.0000 mL | Freq: Once | INTRAMUSCULAR | Status: AC | PRN
  Administered 2023-08-09: 80 mL via INTRAVENOUS

## 2023-08-09 MED ORDER — MORPHINE SULFATE (PF) 4 MG/ML IV SOLN
4.0000 mg | Freq: Once | INTRAVENOUS | Status: AC
  Administered 2023-08-09: 4 mg via INTRAVENOUS
  Filled 2023-08-09: qty 1

## 2023-08-09 MED ORDER — MORPHINE SULFATE 15 MG PO TABS: 15.0000 mg | ORAL_TABLET | Freq: Four times a day (QID) | ORAL | 0 refills | Status: DC | PRN

## 2023-08-09 MED ORDER — SODIUM CHLORIDE 0.9 % IV BOLUS
1000.0000 mL | Freq: Once | INTRAVENOUS | Status: AC
  Administered 2023-08-09: 1000 mL via INTRAVENOUS

## 2023-08-09 NOTE — ED Provider Notes (Incomplete)
[place] EMERGENCY DEPARTMENT AT [facility] Provider Note   CSN: [id number] Arrival date & time: 08/09/23  1600     History {Add pertinent medical, surgical, social history, OB history to HPI:1} Chief Complaint  Patient presents with  . Incisional Pain    Cynthia Underwood is a 52 y.o. female with past medical history significant for myotonic dystrophy, heart block, PPM, pneumonia, PEG tube in place presents to the ED complaining of pain around her PEG tube.  She states she has had pain ongoing for "awhile" but over the last 2 days she has developed worsening pain that makes it difficult to sleep.  She has been able to do her feeds as usual, but did experience one episode of vomiting.  She has been urinating and having bowel movements without difficulty.  Denies fever, skin changes around the insertion site.         Home Medications Prior to Admission medications   Medication Sig Start Date End Date Taking? Authorizing Provider  acetaminophen (TYLENOL) 500 MG tablet Place 500 mg into feeding tube every 6 (six) hours as needed for mild pain.    [provider]  doxycycline (VIBRAMYCIN) 25 MG/5ML SUSR Place 20 mLs (100 mg total) into feeding tube 2 (two) times daily. 07/23/23   Martina Sinner, MD  guaifenesin (HUMIBID E) 400 MG TABS tablet 400 mg 3 (three) times daily. Place 400mg  into feeding tube three times a day 01/19/23   [provider]  hydrocortisone-pramoxine (ANALPRAM HC) 2.5-1 % rectal cream Place 1 Application rectally 3 (three) times daily. 05/16/23   Hoy Register, MD  ipratropium-albuterol (DUONEB) 0.5-2.5 (3) MG/3ML SOLN Inhale 3 mLs into the lungs every 4 (four) hours as needed. SMARTSIG:1 Unspecified Via Inhaler Every 4 Hours PRN 05/09/23   Martina Sinner, MD  levothyroxine (SYNTHROID) 100 MCG tablet Place 1 tablet (100 mcg total) into feeding tube daily before breakfast. 05/29/23   Hoy Register, MD  metoCLOPramide (REGLAN) 5 MG  tablet Place 1 tablet (5 mg total) into feeding tube 3 (three) times daily before meals. 06/14/23   Hoy Register, MD  metoprolol tartrate (LOPRESSOR) 25 MG tablet Place 0.5 tablets (12.5 mg total) into feeding tube 2 (two) times daily. 06/14/23   Hoy Register, MD  Misc. Devices MISC Eval & titrate pt for poc for portable oxygen container. If patient qualifies, dispense at 1-6 pulse dose. Please fax to [phone]. Diagnosis-aspiration pneumonia 06/26/23   Hoy Register, MD  Nutritional Supplements (FEEDING SUPPLEMENT, JEVITY 1.5 CAL/FIBER,) LIQD Place 237 mLs into feeding tube 4 (four) times daily. FWF before and after each feeding 05/25/23   Glade Lloyd, MD  pantoprazole (PROTONIX) 40 MG tablet 40 mg daily. Place 40mg  into the feeding tube daily    [provider]  QUEtiapine (SEROQUEL) 25 MG tablet Place 1 tablet (25 mg total) into feeding tube at bedtime. 12/06/22   Doran Stabler, DO  scopolamine (TRANSDERM-SCOP) 1 MG/3DAYS Place 1 patch onto the skin every 3 (three) days. 01/22/23   [provider]  spironolactone (ALDACTONE) 25 MG tablet Place 0.5 tablets (12.5 mg total) into feeding tube daily. 06/14/23   Hoy Register, MD  Water For Irrigation, Sterile (FREE WATER) SOLN free water via tube before and after each feeding 05/25/23   Glade Lloyd, MD      Allergies    Penicillin g and Penicillins    Review of Systems   Review of Systems  Constitutional:  Negative for fever.  Gastrointestinal:  Positive for abdominal pain, nausea and vomiting. Negative for constipation and diarrhea.  Skin:  Negative for color change.    Physical Exam Updated Vital Signs BP 120/80   Pulse 97   Temp 98.2 F (36.8 C) (Oral)   Resp 16   Ht 5\' 3"  (1.6 m)   Wt 40.9 kg   LMP 12/03/2016 (Approximate)   SpO2 91%   BMI 15.97 kg/m  Physical Exam Vitals and nursing note reviewed.  Constitutional:      General: She is not in acute distress.    Appearance: Normal  appearance. She is ill-appearing (chronically ill-appearing). She is not diaphoretic.  Cardiovascular:     Rate and Rhythm: Normal rate and regular rhythm.  Pulmonary:     Effort: Pulmonary effort is normal.  Abdominal:     General: Abdomen is flat.     Palpations: Abdomen is soft.     Tenderness: There is abdominal tenderness.       Comments: No other abdominal tenderness on exam.   Skin:    General: Skin is warm and dry.     Capillary Refill: Capillary refill takes less than 2 seconds.  Neurological:     Mental Status: She is alert. Mental status is at baseline.  Psychiatric:        Mood and Affect: Mood normal.        Behavior: Behavior normal.     ED Results / Procedures / Treatments   Labs (all labs ordered are listed, but only abnormal results are displayed) Labs Reviewed  COMPREHENSIVE METABOLIC PANEL - Abnormal; Notable for the following components:      Result Value   Glucose, Bld 103 (*)    All other components within normal limits  CBC WITH DIFFERENTIAL/PLATELET    EKG None  Radiology CT ABDOMEN PELVIS W CONTRAST  Result Date: 08/09/2023 CLINICAL DATA:  Pain at PEG tube insertion site EXAM: CT ABDOMEN AND PELVIS WITH CONTRAST TECHNIQUE: Multidetector CT imaging of the abdomen and pelvis was performed using the standard protocol following bolus administration of intravenous contrast. RADIATION DOSE REDUCTION: This exam was performed according to the departmental dose-optimization program which includes automated exposure control, adjustment of the mA and/or kV according to patient size and/or use of iterative reconstruction technique. CONTRAST:  80mL OMNIPAQUE IOHEXOL 300 MG/ML  SOLN COMPARISON:  CT abdomen and pelvis 01/23/2023. FINDINGS: Lower chest: Dense consolidation appears unchanged in the bilateral lower lobes. Hepatobiliary: No focal liver abnormality is seen. No gallstones, gallbladder wall thickening, or biliary dilatation. Pancreas: Unremarkable. No  pancreatic ductal dilatation or surrounding inflammatory changes. Spleen: Normal in size without focal abnormality. Adrenals/Urinary Tract: Bilateral renal cysts are present. The largest is in the right kidney measuring 2.4 cm. There is no hydronephrosis or perinephric fat stranding. The adrenal glands and bladder are within normal limits. Stomach/Bowel: Stomach is within normal limits. Appendix appears normal. No evidence of bowel wall thickening, distention, or inflammatory changes. Percutaneous gastrostomy tube in place in the body of the stomach and appears within normal limits. Vascular/Lymphatic: No significant vascular findings are present. No enlarged abdominal or pelvic lymph nodes. Reproductive: Uterus and bilateral adnexa are unremarkable. Other: No abdominal wall hernia or abnormality. No abdominopelvic ascites. Musculoskeletal: No acute or significant osseous findings. IMPRESSION: 1. No acute localizing process in the abdomen or pelvis. 2. Percutaneous gastrostomy tube in place in the body of the stomach and appears within normal limits. 3. Stable dense consolidation in the bilateral lower lobes. 4. Right Bosniak I benign renal  cyst measuring 2.4 cm. No follow-up imaging is recommended. JACR 2018 Feb; 264-273, Management of the Incidental Renal Mass on CT, [employer] 2021; 814-848, Bosniak Classification of Cystic Renal Masses, Version 2019. Electronically Signed   By: Darliss Cheney M.D.   On: 08/09/2023 22:32    Procedures Procedures  {Document cardiac monitor, telemetry assessment procedure when appropriate:1}  Medications Ordered in ED Medications  morphine (PF) 4 MG/ML injection 4 mg (4 mg Intravenous Given 08/09/23 2115)  iohexol (OMNIPAQUE) 300 MG/ML solution 100 mL (80 mLs Intravenous Contrast Given 08/09/23 2144)    ED Course/ Medical Decision Making/ A&P   {   Click here for ABCD2, HEART and other calculatorsREFRESH Note before signing :1}                              Medical  Decision Making Amount and/or Complexity of Data Reviewed Labs: ordered. Radiology: ordered.  Risk Prescription drug management.   This patient presents to the ED with chief complaint(s) of pain around PEG insertion site with pertinent past medical history of myotonic dystrophy, dysphagia.  The complaint involves an extensive differential diagnosis and also carries with it a high risk of complications and morbidity.    The differential diagnosis includes PEG tube dislodgement or malfunction, abdominal wall infection/cellulitis  The initial plan is to obtain labs, CT scan  Additional history obtained: Additional history obtained from family Records reviewed previous admission documents  Initial Assessment:   Exam significant for chronically ill-appearing patient who is not in acute distress.  There is tenderness and mild firmness in the immediate area surrounding the PEG insertion site, mainly left lateral.  No erythema, increased warmth, or drainage.  No other abdominal tenderness on exam.  No abdominal distension.  Skin is warm and dry.  Vitals are stable and patient is afebrile.    Independent ECG/labs interpretation:  The following labs were independently interpreted:  CBC without leukocytosis or anemia.  Metabolic panel without electrolyte disturbance.  Renal function is normal.   Independent visualization and interpretation of imaging: I independently visualized the following imaging with scope of interpretation limited to determining acute life threatening conditions related to emergency care: CT abdomen/pelvis, which revealed no acute localizing process in the abdomen or pelvis.  Percutaneous gastrostomy tube in place in the body of the stomach and appears within normal limits.  Stable dense consolidation in bilateral lower lung lobes.  Right sided renal cyst.    Treatment and Reassessment: Patient given morphine with improvement in pain.  Discussed results with patient and her  cousin (her caregiver).  Cousin also requesting a nebulizer treatment for her cough.  Patient has recently completed antibiotics for pneumonia.    Patient's BP now 80/57. She states she normally has lower blood pressure.  Will give 1L NS and reassess.    Disposition:   Advised patient to follow up with PCP or gastroenterology and inquire about IR evaluating tube as it is causing pain.  Tube functions appropriately but is causing pain.  Workup is overall reassuring and does not indicate dislodgement or infection.  Will send patient home on short course of pain medication to help with pain symptoms until she is able to have outpatient follow up.  Patient has used oxycodone, hydrocodone, and tramadol through PEG tube without relief of symptoms. Will try 15 mg MSIR tablets.       {Document critical care time when appropriate:1} {Document review of labs and clinical decision  tools ie heart score, Chads2Vasc2 etc:1}  {Document your independent review of radiology images, and any outside records:1} {Document your discussion with family members, caretakers, and with consultants:1} {Document social determinants of health affecting pt's care:1} {Document your decision making why or why not admission, treatments were needed:1} Final Clinical Impression(s) / ED Diagnoses Final diagnoses:  None    Rx / DC Orders ED Discharge Orders     None

## 2023-08-09 NOTE — ED Triage Notes (Signed)
Patient is here for evaluation of pain at the incision site of her feeding tube. Patient reports she was having some pain off and on for the past few weeks, but over the past 2 days her pain has become much worse. Reports movement makes the pain even more severe. Patient still able to use the tube without any issues. Denies any drainage from the tube site.

## 2023-08-09 NOTE — ED Provider Notes (Signed)
[place] EMERGENCY DEPARTMENT AT [facility] Provider Note   CSN: [id number] Arrival date & time: 08/09/23  1600     History  Chief Complaint  Patient presents with   Incisional Pain    Cynthia Underwood is a 52 y.o. female with past medical history significant for myotonic dystrophy, heart block, PPM, pneumonia, PEG tube in place presents to the ED complaining of pain around her PEG tube.  She states she has had pain ongoing for "awhile" but over the last 2 days she has developed worsening pain that makes it difficult to sleep.  She has been able to do her feeds as usual, but did experience one episode of vomiting.  She has been urinating and having bowel movements without difficulty.  Denies fever, skin changes around the insertion site.         Home Medications Prior to Admission medications   Medication Sig Start Date End Date Taking? Authorizing Provider  morphine (MSIR) 15 MG tablet Take 1 tablet (15 mg total) by mouth every 6 (six) hours as needed for severe pain (pain score 7-10). 08/09/23  Yes Shambria Camerer R, PA-C  acetaminophen (TYLENOL) 500 MG tablet Place 500 mg into feeding tube every 6 (six) hours as needed for mild pain.    [provider]  doxycycline (VIBRAMYCIN) 25 MG/5ML SUSR Place 20 mLs (100 mg total) into feeding tube 2 (two) times daily. 07/23/23   Martina Sinner, MD  guaifenesin (HUMIBID E) 400 MG TABS tablet 400 mg 3 (three) times daily. Place 400mg  into feeding tube three times a day 01/19/23   [provider]  hydrocortisone-pramoxine (ANALPRAM HC) 2.5-1 % rectal cream Place 1 Application rectally 3 (three) times daily. 05/16/23   Hoy Register, MD  ipratropium-albuterol (DUONEB) 0.5-2.5 (3) MG/3ML SOLN Inhale 3 mLs into the lungs every 4 (four) hours as needed. SMARTSIG:1 Unspecified Via Inhaler Every 4 Hours PRN 05/09/23   Martina Sinner, MD  levothyroxine (SYNTHROID) 100 MCG tablet Place 1 tablet (100 mcg total) into  feeding tube daily before breakfast. 05/29/23   Hoy Register, MD  metoCLOPramide (REGLAN) 5 MG tablet Place 1 tablet (5 mg total) into feeding tube 3 (three) times daily before meals. 06/14/23   Hoy Register, MD  metoprolol tartrate (LOPRESSOR) 25 MG tablet Place 0.5 tablets (12.5 mg total) into feeding tube 2 (two) times daily. 06/14/23   Hoy Register, MD  Misc. Devices MISC Eval & titrate pt for poc for portable oxygen container. If patient qualifies, dispense at 1-6 pulse dose. Please fax to [phone]. Diagnosis-aspiration pneumonia 06/26/23   Hoy Register, MD  Nutritional Supplements (FEEDING SUPPLEMENT, JEVITY 1.5 CAL/FIBER,) LIQD Place 237 mLs into feeding tube 4 (four) times daily. FWF before and after each feeding 05/25/23   Glade Lloyd, MD  pantoprazole (PROTONIX) 40 MG tablet 40 mg daily. Place 40mg  into the feeding tube daily    [provider]  QUEtiapine (SEROQUEL) 25 MG tablet Place 1 tablet (25 mg total) into feeding tube at bedtime. 12/06/22   Doran Stabler, DO  scopolamine (TRANSDERM-SCOP) 1 MG/3DAYS Place 1 patch onto the skin every 3 (three) days. 01/22/23   [provider]  spironolactone (ALDACTONE) 25 MG tablet Place 0.5 tablets (12.5 mg total) into feeding tube daily. 06/14/23   Hoy Register, MD  Water For Irrigation, Sterile (FREE WATER) SOLN free water via tube before and after each feeding 05/25/23   Glade Lloyd, MD      Allergies  Penicillin g and Penicillins    Review of Systems   Review of Systems  Constitutional:  Negative for fever.  Gastrointestinal:  Positive for abdominal pain, nausea and vomiting. Negative for constipation and diarrhea.  Skin:  Negative for color change.    Physical Exam Updated Vital Signs BP 91/62   Pulse 80   Temp 98.3 F (36.8 C) (Oral)   Resp 18   Ht 5\' 3"  (1.6 m)   Wt 40.9 kg   LMP 12/03/2016 (Approximate)   SpO2 94%   BMI 15.97 kg/m  Physical Exam Vitals and nursing note  reviewed.  Constitutional:      General: She is not in acute distress.    Appearance: Normal appearance. She is ill-appearing (chronically ill-appearing). She is not diaphoretic.  Cardiovascular:     Rate and Rhythm: Normal rate and regular rhythm.  Pulmonary:     Effort: Pulmonary effort is normal.  Abdominal:     General: Abdomen is flat.     Palpations: Abdomen is soft.     Tenderness: There is abdominal tenderness.       Comments: No other abdominal tenderness on exam.   Skin:    General: Skin is warm and dry.     Capillary Refill: Capillary refill takes less than 2 seconds.  Neurological:     Mental Status: She is alert. Mental status is at baseline.  Psychiatric:        Mood and Affect: Mood normal.        Behavior: Behavior normal.     ED Results / Procedures / Treatments   Labs (all labs ordered are listed, but only abnormal results are displayed) Labs Reviewed  COMPREHENSIVE METABOLIC PANEL - Abnormal; Notable for the following components:      Result Value   Glucose, Bld 103 (*)    All other components within normal limits  CBC WITH DIFFERENTIAL/PLATELET    EKG None  Radiology CT ABDOMEN PELVIS W CONTRAST  Result Date: 08/09/2023 CLINICAL DATA:  Pain at PEG tube insertion site EXAM: CT ABDOMEN AND PELVIS WITH CONTRAST TECHNIQUE: Multidetector CT imaging of the abdomen and pelvis was performed using the standard protocol following bolus administration of intravenous contrast. RADIATION DOSE REDUCTION: This exam was performed according to the departmental dose-optimization program which includes automated exposure control, adjustment of the mA and/or kV according to patient size and/or use of iterative reconstruction technique. CONTRAST:  80mL OMNIPAQUE IOHEXOL 300 MG/ML  SOLN COMPARISON:  CT abdomen and pelvis 01/23/2023. FINDINGS: Lower chest: Dense consolidation appears unchanged in the bilateral lower lobes. Hepatobiliary: No focal liver abnormality is seen. No  gallstones, gallbladder wall thickening, or biliary dilatation. Pancreas: Unremarkable. No pancreatic ductal dilatation or surrounding inflammatory changes. Spleen: Normal in size without focal abnormality. Adrenals/Urinary Tract: Bilateral renal cysts are present. The largest is in the right kidney measuring 2.4 cm. There is no hydronephrosis or perinephric fat stranding. The adrenal glands and bladder are within normal limits. Stomach/Bowel: Stomach is within normal limits. Appendix appears normal. No evidence of bowel wall thickening, distention, or inflammatory changes. Percutaneous gastrostomy tube in place in the body of the stomach and appears within normal limits. Vascular/Lymphatic: No significant vascular findings are present. No enlarged abdominal or pelvic lymph nodes. Reproductive: Uterus and bilateral adnexa are unremarkable. Other: No abdominal wall hernia or abnormality. No abdominopelvic ascites. Musculoskeletal: No acute or significant osseous findings. IMPRESSION: 1. No acute localizing process in the abdomen or pelvis. 2. Percutaneous gastrostomy tube in place in the  body of the stomach and appears within normal limits. 3. Stable dense consolidation in the bilateral lower lobes. 4. Right Bosniak I benign renal cyst measuring 2.4 cm. No follow-up imaging is recommended. JACR 2018 Feb; 264-273, Management of the Incidental Renal Mass on CT, [employer] 2021; 814-848, Bosniak Classification of Cystic Renal Masses, Version 2019. Electronically Signed   By: Darliss Cheney M.D.   On: 08/09/2023 22:32    Procedures Procedures    Medications Ordered in ED Medications  morphine (PF) 4 MG/ML injection 4 mg (4 mg Intravenous Given 08/09/23 2115)  iohexol (OMNIPAQUE) 300 MG/ML solution 100 mL (80 mLs Intravenous Contrast Given 08/09/23 2144)  ipratropium-albuterol (DUONEB) 0.5-2.5 (3) MG/3ML nebulizer solution 3 mL (3 mLs Nebulization Given 08/09/23 2341)  sodium chloride 0.9 % bolus 1,000 mL (1,000  mLs Intravenous New Bag/Given 08/09/23 2351)    ED Course/ Medical Decision Making/ A&P                                 Medical Decision Making Amount and/or Complexity of Data Reviewed Labs: ordered. Radiology: ordered.  Risk Prescription drug management.   This patient presents to the ED with chief complaint(s) of pain around PEG insertion site with pertinent past medical history of myotonic dystrophy, dysphagia.  The complaint involves an extensive differential diagnosis and also carries with it a high risk of complications and morbidity.    The differential diagnosis includes PEG tube dislodgement or malfunction, abdominal wall infection/cellulitis  The initial plan is to obtain labs, CT scan  Additional history obtained: Additional history obtained from family Records reviewed previous admission documents  Initial Assessment:   Exam significant for chronically ill-appearing patient who is not in acute distress.  There is tenderness and mild firmness in the immediate area surrounding the PEG insertion site, mainly left lateral.  No erythema, increased warmth, or drainage.  No other abdominal tenderness on exam.  No abdominal distension.  Skin is warm and dry.  Vitals are stable and patient is afebrile.    Independent ECG/labs interpretation:  The following labs were independently interpreted:  CBC without leukocytosis or anemia.  Metabolic panel without electrolyte disturbance.  Renal function is normal.   Independent visualization and interpretation of imaging: I independently visualized the following imaging with scope of interpretation limited to determining acute life threatening conditions related to emergency care: CT abdomen/pelvis, which revealed no acute localizing process in the abdomen or pelvis.  Percutaneous gastrostomy tube in place in the body of the stomach and appears within normal limits.  Stable dense consolidation in bilateral lower lung lobes.  Right sided  renal cyst.    Treatment and Reassessment: Patient given morphine with improvement in pain.  Discussed results with patient and her cousin (her caregiver).  Cousin also requesting a nebulizer treatment for her cough.  Patient has recently completed antibiotics for pneumonia.    Patient's BP now 80/57. She states she normally has lower blood pressure.  Will give 1L NS and reassess.  Patient's BP already improving with fluids.  She reports feeling better after pain medicine.  Morphine likely cause of hypotension.    Disposition:   Advised patient to follow up with PCP or gastroenterology and inquire about IR evaluating tube as it is causing pain.  Tube functions appropriately but is causing pain.  Workup is overall reassuring and does not indicate dislodgement or infection.  Will send patient home on short course of pain  medication to help with pain symptoms until she is able to have outpatient follow up.  Patient has used oxycodone, hydrocodone, and tramadol through PEG tube without relief of symptoms. Will try 15 mg MSIR tablets.   The patient has been appropriately medically screened and/or stabilized in the ED. I have low suspicion for any other emergent medical condition which would require further screening, evaluation or treatment in the ED or require inpatient management. At time of discharge the patient is hemodynamically stable and in no acute distress. I have discussed work-up results and diagnosis with patient and answered all questions. Patient is agreeable with discharge plan. We discussed strict return precautions for returning to the emergency department and they verbalized understanding.             Final Clinical Impression(s) / ED Diagnoses Final diagnoses:  Pain around percutaneous endoscopic gastrostomy (PEG) tube site, initial encounter    Rx / DC Orders ED Discharge Orders          Ordered    morphine (MSIR) 15 MG tablet  Every 6 hours PRN        08/09/23 2357               Lenard Simmer, PA-C 08/10/23 0015    Durwin Glaze, MD 08/10/23 2337

## 2023-08-09 NOTE — Discharge Instructions (Addendum)
Thank you for allowing [place] to be a part of your care today.  You were evaluated in the ED for pain around your PEG tube.  Your CT scan did not show evidence of infection.  Your tube is not dislodged and appears within normal limits.   I recommend contacting your primary care doctor or gastroenterologist and asking about having interventional radiology assess your tube for any potential malfunction or cause of your pain.  At this time, there is no indication to emergently/urgently replace your tube.    I am sending you home with a different pain medication to take for severe pain.  Take as prescribed.   Return to the ED if you develop sudden worsening of your symptoms or if you have new concerns.

## 2023-08-10 ENCOUNTER — Ambulatory Visit: Payer: 59 | Admitting: Speech Pathology

## 2023-08-10 DIAGNOSIS — T85848A Pain due to other internal prosthetic devices, implants and grafts, initial encounter: Secondary | ICD-10-CM | POA: Diagnosis not present

## 2023-08-15 ENCOUNTER — Ambulatory Visit: Payer: 59 | Attending: Licensed Clinical Social Worker | Admitting: Licensed Clinical Social Worker

## 2023-08-15 DIAGNOSIS — F320 Major depressive disorder, single episode, mild: Secondary | ICD-10-CM

## 2023-08-16 ENCOUNTER — Ambulatory Visit: Payer: Self-pay

## 2023-08-16 NOTE — Therapy (Unsigned)
OUTPATIENT SPEECH LANGUAGE PATHOLOGY SWALLOW EVALUATION   Patient Name: Cynthia Underwood MRN: [id number] DOB:01-Apr-1971, 52 y.o., female Today's Date: 08/20/2023  PCP: Hoy Register, MD REFERRING PROVIDER: Hoy Register, MD  END OF SESSION:  End of Session - 08/20/23 1512     Visit Number 1    Number of Visits 17    Date for SLP Re-Evaluation 10/15/23    SLP Start Time 0845    SLP Stop Time  0930    SLP Time Calculation (min) 45 min    Activity Tolerance Patient tolerated treatment well             Past Medical History:  Diagnosis Date   Bifascicular block 10/02/2014   Cataract    Dyspnea    Excess ear wax    Multinodular goiter    Pneumonia    Presence of permanent cardiac pacemaker    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   Sickle cell trait (HCC)    Watery eyes    Left   Weakness of both legs    Past Surgical History:  Procedure Laterality Date   BREAST BIOPSY Right    EYE SURGERY Left 2017   "related to blockage in my nose"   EYE SURGERY Right 05/2019   INSERT / REPLACE / REMOVE PACEMAKER  02/15/2017   IR GASTROSTOMY TUBE MOD SED  01/01/2023   PACEMAKER IMPLANT N/A 02/15/2017   Procedure: Pacemaker Implant;  Surgeon: Duke Salvia, MD;  Location: [facility] INVASIVE CV LAB;  Service: Cardiovascular;  Laterality: N/A;   RIGHT/LEFT HEART CATH AND CORONARY ANGIOGRAPHY N/A 11/16/2022   Procedure: RIGHT/LEFT HEART CATH AND CORONARY ANGIOGRAPHY;  Surgeon: Laurey Morale, MD;  Location: [facility] INVASIVE CV LAB;  Service: Cardiovascular;  Laterality: N/A;   THYROIDECTOMY N/A 04/21/2021   Procedure: TOTAL THYROIDECTOMY;  Surgeon: Axel Filler, MD;  Location: [facility] OR;  Service: General;  Laterality: N/A;   Patient Active Problem List   Diagnosis Date Noted   Aspiration pneumonia (HCC) 05/21/2023   S/P percutaneous endoscopic gastrostomy (PEG) tube placement (HCC) 03/13/2023   Diarrhea 01/23/2023   SIRS (systemic  inflammatory response syndrome) (HCC) 01/23/2023   Chronic respiratory failure with hypoxia (HCC) 01/23/2023   Dysphagia 01/23/2023   Chronic systolic CHF (congestive heart failure) (HCC) 01/23/2023   Acute on chronic respiratory failure with hypoxia (HCC) 12/07/2022   Tracheostomy status (HCC) 12/07/2022   Generalized anxiety disorder 12/06/2022   Acute systolic heart failure (HCC) 11/26/2022   Drug-induced torsades de pointes (HCC) 11/17/2022   Acute systolic CHF (congestive heart failure) (HCC) 11/16/2022   Acute hypoxemic respiratory failure (HCC) 11/15/2022   Acute on chronic combined systolic and diastolic CHF (congestive heart failure) (HCC) 11/15/2022   Protein-calorie malnutrition, severe 11/15/2022   Cardiac arrest (HCC) 11/14/2022   Healthcare-associated pneumonia 11/11/2022   Hypothyroidism 11/07/2021   Thyroid nodule 04/21/2021   S/P total thyroidectomy 04/21/2021   Sinus node dysfunction (HCC) 03/10/2020   Weakness of both legs    Watery eyes    Sickle cell trait (HCC)    Presence of permanent cardiac pacemaker    Excess ear wax    Depression    Anemia    Cardiac pacemaker in situ 03/01/2017   Mobitz type 2 second degree heart block 02/15/2017   Symptomatic advanced heart block 02/12/2017   SOB (shortness of breath) 10/02/2014   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   Bifascicular block 10/02/2014  Infection due to trichomonas vaginalis 09/15/2014   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    ONSET DATE: 11/2022   REFERRING DIAG:  J69.0 (ICD-10-CM) - Aspiration pneumonia of right middle lobe, unspecified aspiration pneumonia type (HCC)  Z93.1 (ICD-10-CM) - S/P percutaneous endoscopic gastrostomy (PEG) tube placement (HCC)    THERAPY DIAG:  Dysphagia, unspecified type  Rationale for Evaluation and Treatment: Rehabilitation  SUBJECTIVE:   SUBJECTIVE STATEMENT: "They told me I can't eat but sometimes I can"  Pt  accompanied by: family member  PERTINENT HISTORY: myotonic dystophy, hypothyroidism, anxiety. PEG dependence.   PAIN:  Are you having pain? No  FALLS: Has patient fallen in last 6 months?  No  LIVING ENVIRONMENT: Lives with: lives with their family Lives in: House/apartment  PLOF:  Level of assistance: Independent with ADLs, Independent with IADLs Employment: On disability  PATIENT GOALS: Ability to tolerate p.o.  OBJECTIVE:  Note: Objective measures were completed at Evaluation unless otherwise noted. OBJECTIVE:   INSTRUMENTAL SWALLOW STUDY FINDINGS (MBSS) 06/18/2023 Clinical Impression Pt continues to exhibit signifcant pharyngoesophageal dysphagia marked by reduced tongue base retraction, decreased laryngeal elevation, no epiglottic deflection and reduced anterior hyoid excursion. Majority of boluses remained in valleculae and pyriform sinuses with reduced UES opening. Thin liquid with teaspoon penetrated near cord level, cups sip thin penetrated after the swallow (PAS 3), nectar thick penetrated from residue and puree while performing a chin tuck spilled into airway (PAS 3). Pt did throat clear several times throughout study, however overall sensation was reduced. She demonstrated significant difficulty initiating a second swallow stating that her mouth was dry in attempts to clear residue. A left and right head turn and dry swallows were not effective. Esophageal scan revealed stasis in distal esophagus. Recommend pt continue NPO status and use PEG for nutrition. She may have sips of thin water (only) after oral care, however pt states that she does not like water. She also states that she gets hungry despite her tube feedings. Pt and cousin report she has not been having home health ST and SLP recommends home health ST to work on pharyngeal strength.  COGNITION: Overall cognitive status: Within functional limits for tasks assessed  SUBJECTIVE DYSPHAGIA REPORTS:  Date of onset:  March 2024 Reported symptoms: coughing with both solids and liquids, globus sensation, xerostomia, shortness of breath with PO, and weak voice  Current diet:  PEG dependent, thin liquid (water) PO  Co-morbid voice changes: Yes  Weight Loss: 20 lbs   FACTORS WHICH MAY INCREASE RISK OF ADVERSE EVENT IN PRESENCE OF ASPIRATION:  General health: frail or deconditioned  Risk factors: reduced respiratory function, weak cough, GERD or other GI disease, and tube present (PEG)     ORAL MOTOR EXAMINATION: Overall status: Impaired: overall reduced strength evidenced   CLINICAL SWALLOW ASSESSMENT:   Dentition: dentures (upper), some natural dentition on bottom -- eats without teeth  Vocal quality at baseline: hoarse, low vocal intensity, and vocal fatigue Patient directly observed with POs: Yes: thin liquids  Feeding: able to feed self Liquids provided by: teaspoon and cup [facility] Swallow Protocol: Pass Oral phase signs and symptoms: anterior loss/spillage Pharyngeal phase signs and symptoms: multiple swallows, wet vocal quality, delayed throat clear, and delayed cough  PATIENT REPORTED OUTCOME MEASURES (PROM): deferred   TODAY'S TREATMENT:     08/16/23: Education provided on evaluation results and SLP's recommendations. Pt verbalizes agreement with POC, all questions answered to satisfaction. Initiated training re: effortful swallow with tsp thin liquid as dysphagia exercise.  Patient able  to demonstrate x 20 effortful swallows this date no overt signs or symptoms of aspiration noted.  Education provided on mitigating risk associated with aspiration with high emphasis placed on routine 3 times daily oral care.   PATIENT EDUCATION: Education details: see above Person educated: Patient and Caregiver cousin Education method: Explanation, Demonstration, Verbal cues, and Handouts Education comprehension: verbalized understanding, returned demonstration, and needs further  education   ASSESSMENT:  CLINICAL IMPRESSION: Patient is a 52 y.o. F who was seen today for dysphagia evaluation.Evaluation reveals severe dysphagia. Pt is PEG dependent. See above for physiologic deficits per instrumental swallow study. Addtionally, pt demonstrates hypophonia. Defers to address at this time, is more concerned with swallow function. Pt reports 1x day oral care, water PO, did consume some solids on Thanksgiving without overt difficulty. Prior to onset of dysphagia, pt ate soft solid diet d/t dentition. Pt would benefit from skilled ST to address aforementioned deficits to improve QoL.    OBJECTIVE IMPAIRMENTS: include dysphagia. These impairments are limiting patient from safety when swallowing. Factors affecting potential to achieve goals and functional outcome are severity of impairments. Patient will benefit from skilled SLP services to address above impairments and improve overall function.  REHAB POTENTIAL: Good   GOALS: Goals reviewed with patient? Yes  SHORT TERM GOALS: Target date: 09/14/2023  Pt will report daily HEP completion over 1 week period  Baseline: Goal status: INITIAL  2.  Pt will accurately demonstrate dysphagia exercises with min-A Baseline:  Goal status: INITIAL  3.  Pt will increase oral care BID over one week period Baseline:  Goal status: INITIAL   LONG TERM GOALS: Target date: 10/12/2023  Pt will be independent with dysphagia HEP Baseline:  Goal status: INITIAL  2.  Pt will complete MBSS  Baseline:  Goal status: INITIAL  3.  Pt will utilize swallow compensations and strategies to support PO consumption of least restrictive diet based on function demonstrated on repeat instrumental swallow study Baseline:  Goal status: INITIAL   PLAN:  SLP FREQUENCY: 2x/week  SLP DURATION: 8 weeks  PLANNED INTERVENTIONS: 92526 Treatment of swallowing function, Re-evaluation, Aspiration precaution training, Pharyngeal strengthening exercises,  Diet toleration management , Trials of upgraded texture/liquids, SLP instruction and feedback, Compensatory strategies, and Patient/family education    Maia Breslow, CCC-SLP 08/20/2023, 3:13 PM

## 2023-08-16 NOTE — Patient Instructions (Signed)
Visit Information  Thank you for taking time to visit with me today. Please don't hesitate to contact me if I can be of assistance to you.   Following are the goals we discussed today:   Goals Addressed             This Visit's Progress    To be able to eat and drink normally and regain my speech   On track    Care Coordination Interventions: Completed outbound call with patient and sister Malary Mabin  Evaluation of current treatment plan related to Impaired Speech and Swallowing and patient's adherence to plan as established by provider Discussed and reviewed initial outpatient ST is scheduled for 08/17/23 @8 :45 AM, with Link Snuffer CCC-SLP at [facility]      To establish with registered dietician for management of protein/caloric intake   On track    Care Coordination Interventions: Completed outbound call with sister Alieza Stepanski Evaluation of current treatment plan related to caloric malnutrition w/G-tube and patient's adherence to plan as established by provider Confirmed patient received her tube feedings from [facility] as previously noted from last nurse call  Discussed patient is self administering her tube feeds without difficulty, she is supplementing with protein drinks, she has gain 4 lbs and is tolerating her feedings without difficulty, although she had 1 episode of vomiting Discussed patient experienced one ED visit since last nurse contact due to having pain at the tube site, discussed her exam revealed no definitive cause and patient is taking Tramadol for pain  Educated sister about potential IR referral to further evaluate patient's pain and the PEG insertion site Reviewed and discussed patient's next scheduled PCP follow up is scheduled for 08/22/23 @09 :30 AM with [name] at which time patient will discuss with her PCP Discussed plans with patient for ongoing care coordination follow up and provided patient with direct contact information for nurse care  coordinator Patient will continue to administer her feedings via PEG as directed Patient will continue to monitor her weight and report persistent weight loss to her PCP Patient will report new symptoms or concerns to her PCP promptly Patient will keep all scheduled follow up appointments as directed Patient will discuss if IR referral is needed with [name], PCP during next scheduled follow up visit  Patient will continue to work with nurse care coordinator for ongoing chronic disease management and care coordination needs        Our next appointment is by telephone on 09/13/23 at 09:30 AM  Please call the care guide team at [phone] if you need to cancel or reschedule your appointment.   If you are experiencing a Mental Health or Behavioral Health Crisis or need someone to talk to, please call 1-800-273-TALK (toll free, 24 hour hotline)  Patient verbalizes understanding of instructions and care plan provided today and agrees to view in MyChart. Active MyChart status and patient understanding of how to access instructions and care plan via MyChart confirmed with patient.     [name] RN BSN CCM [place]  [facility], [facility] Nurse Care Coordinator  Direct Dial: [phone] Website: [email or web address]

## 2023-08-16 NOTE — Patient Outreach (Addendum)
Care Coordination   Follow Up Visit Note   08/16/2023 Name: Cynthia Underwood MRN: [id number] DOB: 09-19-1970  Cynthia Underwood is a 52 y.o. year old female who sees Hoy Register, MD for primary care. I spoke with  Cynthia Underwood by phone today.  What matters to the patients health and wellness today?  Patient would like to continue to tolerate her feedings without complications or further weight loss.     Goals Addressed             This Visit's Progress    To be able to eat and drink normally and regain my speech   On track    Care Coordination Interventions: Completed outbound call with patient and sister Cynthia Underwood  Evaluation of current treatment plan related to Impaired Speech and Swallowing and patient's adherence to plan as established by provider Discussed and reviewed initial outpatient ST is scheduled for 08/17/23 @8 :45 AM, with Link Snuffer CCC-SLP at [facility]      To establish with registered dietician for management of protein/caloric intake   On track    Care Coordination Interventions: Completed outbound call with sister Cynthia Underwood Evaluation of current treatment plan related to caloric malnutrition w/G-tube and patient's adherence to plan as established by provider Confirmed patient received her tube feedings from [facility] as previously noted from last nurse call  Discussed patient is self administering her tube feeds without difficulty, she is supplementing with protein drinks, she has gain 4 lbs and is tolerating her feedings without difficulty, although she had 1 episode of vomiting Discussed patient experienced one ED visit since last nurse contact due to having pain at the tube site, discussed her exam revealed no definitive cause and patient is taking Tramadol for pain  Educated sister about potential IR referral to further evaluate patient's pain and the PEG insertion site Reviewed and discussed patient's next scheduled PCP follow up  is scheduled for 08/22/23 @09 :30 AM with [name] at which time patient will discuss with her PCP Discussed plans with patient for ongoing care coordination follow up and provided patient with direct contact information for nurse care coordinator Patient will continue to administer her feedings via PEG as directed Patient will continue to monitor her weight and report persistent weight loss to her PCP Patient will report new symptoms or concerns to her PCP promptly Patient will keep all scheduled follow up appointments as directed Patient will discuss if IR referral is needed with [name], PCP during next scheduled follow up visit  Patient will continue to work with nurse care coordinator for ongoing chronic disease management and care coordination needs    Interventions Today    Flowsheet Row Most Recent Value  Chronic Disease   Chronic disease during today's visit Other  [pain around PEG tube,  dysphagia]  General Interventions   General Interventions Discussed/Reviewed General Interventions Discussed, General Interventions Reviewed, Doctor Visits, Communication with  Doctor Visits Discussed/Reviewed Doctor Visits Discussed, Doctor Visits Reviewed, PCP, Specialist  Communication with PCP/Specialists  [[name] via in basket]  Exercise Interventions   Exercise Discussed/Reviewed Weight Managment  Weight Management Weight maintenance  Education Interventions   Education Provided Provided Education  Provided Verbal Education On Nutrition, When to see the doctor, Medication  Nutrition Interventions   Nutrition Discussed/Reviewed Nutrition Discussed, Nutrition Reviewed, Increasing proteins, Supplemental nutrition, Fluid intake  Pharmacy Interventions   Pharmacy Dicussed/Reviewed Pharmacy Topics Discussed, Pharmacy Topics Reviewed          SDOH  assessments and interventions completed:  No     Care Coordination Interventions:  Yes, provided   Follow up plan: Follow up call  scheduled for 09/13/23 @09 :30 AM    Encounter Outcome:  Patient Visit Completed

## 2023-08-17 ENCOUNTER — Ambulatory Visit: Payer: 59 | Attending: Family Medicine | Admitting: Speech Pathology

## 2023-08-17 DIAGNOSIS — Z931 Gastrostomy status: Secondary | ICD-10-CM | POA: Diagnosis not present

## 2023-08-17 DIAGNOSIS — R1310 Dysphagia, unspecified: Secondary | ICD-10-CM | POA: Insufficient documentation

## 2023-08-17 DIAGNOSIS — J690 Pneumonitis due to inhalation of food and vomit: Secondary | ICD-10-CM | POA: Insufficient documentation

## 2023-08-17 NOTE — Patient Instructions (Addendum)
  Effortful Swallows - Squeeze hard with the muscles in your neck while you swallow a tsp water - Repeat 20 times, 2-3 times a day, and use whenever you drink   Increase oral care -- brush out mouth and clean teeth 2-3 time per day

## 2023-08-20 ENCOUNTER — Ambulatory Visit (INDEPENDENT_AMBULATORY_CARE_PROVIDER_SITE_OTHER): Payer: 59

## 2023-08-20 DIAGNOSIS — I441 Atrioventricular block, second degree: Secondary | ICD-10-CM | POA: Diagnosis not present

## 2023-08-22 ENCOUNTER — Ambulatory Visit: Payer: 59 | Attending: Family Medicine | Admitting: Family Medicine

## 2023-08-22 ENCOUNTER — Encounter: Payer: Self-pay | Admitting: Family Medicine

## 2023-08-22 VITALS — BP 93/63 | HR 81 | Ht 63.0 in | Wt 94.2 lb

## 2023-08-22 DIAGNOSIS — I959 Hypotension, unspecified: Secondary | ICD-10-CM | POA: Diagnosis not present

## 2023-08-22 DIAGNOSIS — R1084 Generalized abdominal pain: Secondary | ICD-10-CM | POA: Diagnosis not present

## 2023-08-22 DIAGNOSIS — J479 Bronchiectasis, uncomplicated: Secondary | ICD-10-CM | POA: Diagnosis not present

## 2023-08-22 DIAGNOSIS — Z1211 Encounter for screening for malignant neoplasm of colon: Secondary | ICD-10-CM

## 2023-08-22 DIAGNOSIS — Z931 Gastrostomy status: Secondary | ICD-10-CM

## 2023-08-22 DIAGNOSIS — G7111 Myotonic muscular dystrophy: Secondary | ICD-10-CM | POA: Diagnosis not present

## 2023-08-22 DIAGNOSIS — E890 Postprocedural hypothyroidism: Secondary | ICD-10-CM

## 2023-08-22 DIAGNOSIS — I441 Atrioventricular block, second degree: Secondary | ICD-10-CM | POA: Diagnosis not present

## 2023-08-22 LAB — CUP PACEART REMOTE DEVICE CHECK
Battery Remaining Longevity: 39 mo
Battery Voltage: 2.94 V
Brady Statistic AP VP Percent: 91.02 %
Brady Statistic AP VS Percent: 0 %
Brady Statistic AS VP Percent: 8.98 %
Brady Statistic AS VS Percent: 0 %
Brady Statistic RA Percent Paced: 91.02 %
Brady Statistic RV Percent Paced: 100 %
Date Time Interrogation Session: 20241218015242
Implantable Lead Connection Status: 753985
Implantable Lead Connection Status: 753985
Implantable Lead Implant Date: 20180614
Implantable Lead Implant Date: 20180614
Implantable Lead Location: 753859
Implantable Lead Location: 753860
Implantable Lead Model: 3830
Implantable Lead Model: 5076
Implantable Pulse Generator Implant Date: 20180614
Lead Channel Impedance Value: 285 Ohm
Lead Channel Impedance Value: 342 Ohm
Lead Channel Impedance Value: 399 Ohm
Lead Channel Impedance Value: 513 Ohm
Lead Channel Pacing Threshold Amplitude: 0.75 V
Lead Channel Pacing Threshold Amplitude: 1.125 V
Lead Channel Pacing Threshold Pulse Width: 0.4 ms
Lead Channel Pacing Threshold Pulse Width: 0.4 ms
Lead Channel Sensing Intrinsic Amplitude: 2.5 mV
Lead Channel Sensing Intrinsic Amplitude: 2.5 mV
Lead Channel Sensing Intrinsic Amplitude: 2.625 mV
Lead Channel Sensing Intrinsic Amplitude: 3.25 mV
Lead Channel Setting Pacing Amplitude: 2 V
Lead Channel Setting Pacing Amplitude: 2 V
Lead Channel Setting Pacing Pulse Width: 1 ms
Lead Channel Setting Sensing Sensitivity: 0.9 mV
Zone Setting Status: 755011
Zone Setting Status: 755011

## 2023-08-22 MED ORDER — LIDOCAINE-PRILOCAINE 2.5-2.5 % EX CREA: 1.0000 | TOPICAL_CREAM | CUTANEOUS | 1 refills | Status: DC | PRN

## 2023-08-22 MED ORDER — DULOXETINE HCL 60 MG PO CPEP: 60.0000 mg | ORAL_CAPSULE | Freq: Every day | ORAL | 3 refills | Status: DC

## 2023-08-22 NOTE — Patient Instructions (Signed)
VISIT SUMMARY:  During today's visit, we addressed your persistent pain around the PEG tube site, reviewed your thyroid function, and discussed your bronchiectasis and physical therapy needs. We have made some adjustments to your treatment plan to help manage your symptoms more effectively.  YOUR PLAN:  -PEG TUBE SITE PAIN: You are experiencing chronic pain around your PEG tube site, which is worsened by movement. This pain is both superficial and deep. We have prescribed Emla cream for topical application to help numb the area and Duloxetine (Cymbalta) to manage the chronic pain.  -HYPOTHYROIDISM: Your last thyroid panel showed a low TSH level, which suggests that you might be receiving too much thyroid hormone, putting you at risk for hyperthyroid symptoms. We will repeat your thyroid panel to check your current TSH level and adjust your Levothyroxine dosage based on the results.  -BRONCHIECTASIS: Bronchiectasis is a condition where the airways in your lungs are damaged, leading to mucus build-up and infections. You have completed your course of Doxycycline and are not currently experiencing symptoms. Please continue with your follow-up appointments with your pulmonologist as planned.  -PHYSICAL THERAPY: You have requested outpatient physical therapy to help with your mobility and overall physical health. We have submitted a referral for you to start this therapy.  -GENERAL HEALTH MAINTENANCE: We will ensure all your necessary medication refills are sent to the pharmacy. Additionally, we will schedule a follow-up appointment to assess your response to the new pain management regimen and review your lab results.  INSTRUCTIONS:  Please apply the Emla cream around your PEG tube site as directed and start taking Duloxetine (Cymbalta) for pain management. We will repeat your thyroid panel to check your TSH level and adjust your Levothyroxine dosage accordingly. Continue with your follow-up  appointments with your pulmonologist and neurologist. We have also submitted a referral for outpatient physical therapy. Ensure you get your medication refills from the pharmacy. Schedule a follow-up appointment to review your response to the new pain management plan and lab results.

## 2023-08-22 NOTE — Progress Notes (Signed)
Subjective:  Patient ID: Cynthia Underwood, female    DOB: 02-05-1971  Age: 52 y.o. MRN: [id number]  CC: Medical Management of Chronic Issues (Referral to PT)   HPI Cynthia Underwood is a 52 y.o. 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  History of PEG tube and  history of trach tube (status post removal), bronchiectasis.  Interval History: Discussed the use of AI scribe software for clinical note transcription with the patient, who gave verbal consent to proceed.  She presents with persistent pain around her PEG tube site. The discomfort started about a month ago and has not improved despite an emergency room visit two weeks ago.  CT scan revealed no acute process in the abdomen, PEG tube in the body of the stomach appearing normal.  The pain is both superficial and deep, and is exacerbated by movement of the PEG tube. She denies any discharge around the PEG tube site and has not been applying any dressing or cream around the tube. She has not been seen by interventional radiology for the PEG tube and manages the feeds at home with assistance.  The patient also reports diarrhea, but denies constipation. She has been experiencing secretions related to her bronchiectasis, but has completed a course of doxycycline. She is due to see her pulmonologist in four months and her neurologist at the end of the month.  The patient's last thyroid panel in September showed a low TSH. She has been taking levothyroxine for her hypothyroidism.   She is requesting referral to PT.    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   Sickle cell trait (HCC)    Watery eyes    Left   Weakness of both legs     Past Surgical History:  Procedure Laterality Date   BREAST BIOPSY Right    EYE SURGERY Left 2017   "related to blockage in my nose"   EYE SURGERY Right 05/2019   INSERT / REPLACE / REMOVE PACEMAKER  02/15/2017   IR GASTROSTOMY TUBE MOD SED  01/01/2023   PACEMAKER IMPLANT N/A 02/15/2017   Procedure: Pacemaker Implant;  Surgeon: Duke Salvia, MD;  [facility] INVASIVE CV LAB;  Service: Cardiovascular;  Laterality: N/A;   RIGHT/LEFT HEART CATH AND CORONARY ANGIOGRAPHY N/A 11/16/2022   Procedure: RIGHT/LEFT HEART CATH AND CORONARY ANGIOGRAPHY;  Surgeon: Laurey Morale, MD;  Location: [facility] INVASIVE CV LAB;  Service: Cardiovascular;  Laterality: N/A;   THYROIDECTOMY N/A 04/21/2021   Procedure: TOTAL THYROIDECTOMY;  Surgeon: Axel Filler, MD;  Location: [facility] OR;  Service: General;  Laterality: N/A;    Family History  Problem Relation Age of Onset   Cancer Father 51       Deceased   Heart disease Father    COPD Mother    Diabetes Maternal Uncle    Heart disease Maternal Uncle    Heart disease Paternal Grandmother    Diabetes Paternal Grandmother    Hypertension Paternal Grandmother    Neuromuscular disorder Maternal Aunt  Myotonic dystrophy   Neuromuscular disorder Cousin        Myotonic dystrophy   Neuromuscular disorder Sister        Myotonic dystrophy   Colon cancer Neg Hx    Colon polyps Neg Hx    Esophageal cancer Neg Hx    Rectal cancer Neg Hx    Stomach cancer Neg Hx     Social History   Socioeconomic History   Marital status: Single    Spouse name: Not on file   Number of children: 0   Years of education: 1.5 Collge   Highest education level: Not on file  Occupational History   Occupation: Unemployed    Employer: OTHER  Tobacco Use   Smoking status: Former     Current packs/day: 0.00    Types: Cigarettes    Start date: 10/04/1988    Quit date: 10/04/2016    Years since quitting: 6.8   Smokeless tobacco: Never  Vaping Use   Vaping status: Never Used  Substance and Sexual Activity   Alcohol use: Not Currently    Alcohol/week: 0.0 standard drinks of alcohol    Comment:  few times a year   Drug use: Not Currently    Types: Marijuana    Comment: occ    Sexual activity: Not Currently    Birth control/protection: None, Condom  Other Topics Concern   Not on file  Social History Narrative   Patient lives at home with her aunt.   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  She moved to [facility] in August 2015.   She has been on disability since 2010.   Education: 1 1/2 years of college.   Caffeine - coke 2 cans/day   Exercise - no   Social Drivers of Corporate investment banker Strain: Low Risk  (08/22/2023)   Overall Financial Resource Strain (CARDIA)    Difficulty of Paying Living Expenses: Not hard at all  Food Insecurity: No Food Insecurity (08/22/2023)   Hunger Vital Sign    Worried About Running Out of Food in the Last Year: Never true    Ran Out of Food in the Last Year: Never true  Transportation Needs: No Transportation Needs (08/22/2023)   PRAPARE - Administrator, Civil Service (Medical): No    Lack of Transportation (Non-Medical): No  Physical Activity: Inactive (08/22/2023)   Exercise Vital Sign    Days of Exercise per Week: 0 days    Minutes of Exercise per Session: 0 min  Stress: No Stress Concern Present (08/22/2023)   [employer] of Occupational Health - Occupational Stress Questionnaire    Feeling of Stress : Not at all  Social Connections: Moderately Isolated (08/22/2023)   Social Connection and Isolation Panel [NHANES]    Frequency of Communication with Friends and Family: Three times a week    Frequency of Social Gatherings with Friends and Family: Twice a week    Attends Religious  Services: Never    Database administrator or Organizations: Yes    Attends Banker Meetings: Never    Marital Status: Never married    Allergies  Allergen Reactions   Penicillin G Nausea And Vomiting, Rash and Other (See Comments)    Sore throat Has had cephalosporins in past   Penicillins Nausea And Vomiting, Rash and Other (See Comments)    Fever Has patient had a PCN reaction causing immediate rash, facial/tongue/throat swelling, SOB or lightheadedness with hypotension: Yes  Has patient had a PCN reaction causing severe rash involving mucus membranes or skin necrosis: Unknown Has patient had a PCN reaction that required hospitalization: No Has patient had a PCN reaction occurring within the last 10 years: No If all of the above answers are "NO", then may proceed with Cephalosporin use.     Outpatient Medications Prior to Visit  Medication Sig Dispense Refill   acetaminophen (TYLENOL) 500 MG tablet Place 500 mg into feeding tube every 6 (six) hours as needed for mild pain.     guaifenesin (HUMIBID E) 400 MG TABS tablet 400 mg 3 (three) times daily. Place 400mg  into feeding tube three times a day     hydrocortisone-pramoxine (ANALPRAM HC) 2.5-1 % rectal cream Place 1 Application rectally 3 (three) times daily. 30 g 3   ipratropium-albuterol (DUONEB) 0.5-2.5 (3) MG/3ML SOLN Inhale 3 mLs into the lungs every 4 (four) hours as needed. SMARTSIG:1 Unspecified Via Inhaler Every 4 Hours PRN 360 mL 3   levothyroxine (SYNTHROID) 100 MCG tablet Place 1 tablet (100 mcg total) into feeding tube daily before breakfast. 90 tablet 0   metoCLOPramide (REGLAN) 5 MG tablet Place 1 tablet (5 mg total) into feeding tube 3 (three) times daily before meals. 30 tablet 0   metoprolol tartrate (LOPRESSOR) 25 MG tablet Place 0.5 tablets (12.5 mg total) into feeding tube 2 (two) times daily. 180 tablet 0   Misc. Devices MISC Eval & titrate pt for poc for portable oxygen container. If patient  qualifies, dispense at 1-6 pulse dose. Please fax to [phone]. Diagnosis-aspiration pneumonia 1 each 0   morphine (MSIR) 15 MG tablet Take 1 tablet (15 mg total) by mouth every 6 (six) hours as needed for severe pain (pain score 7-10). 15 tablet 0   Nutritional Supplements (FEEDING SUPPLEMENT, JEVITY 1.5 CAL/FIBER,) LIQD Place 237 mLs into feeding tube 4 (four) times daily. FWF before and after each feeding 1000 mL 30   pantoprazole (PROTONIX) 40 MG tablet 40 mg daily. Place 40mg  into the feeding tube daily     QUEtiapine (SEROQUEL) 25 MG tablet Place 1 tablet (25 mg total) into feeding tube at bedtime.     scopolamine (TRANSDERM-SCOP) 1 MG/3DAYS Place 1 patch onto the skin every 3 (three) days.     spironolactone (ALDACTONE) 25 MG tablet Place 0.5 tablets (12.5 mg total) into feeding tube daily. 90 tablet 0   Water For Irrigation, Sterile (FREE WATER) SOLN free water via tube before and after each feeding     doxycycline (VIBRAMYCIN) 25 MG/5ML SUSR Place 20 mLs (100 mg total) into feeding tube 2 (two) times daily. 540 mL 0   No facility-administered medications prior to visit.     ROS Review of Systems  Constitutional:  Negative for activity change and appetite change.  HENT:  Negative for sinus pressure and sore throat.   Respiratory:  Negative for chest tightness, shortness of breath and wheezing.   Cardiovascular:  Negative for chest pain and palpitations.  Gastrointestinal:  Positive for diarrhea. Negative for abdominal distention, abdominal pain and constipation.  Genitourinary: Negative.   Musculoskeletal: Negative.   Psychiatric/Behavioral:  Negative for behavioral problems and dysphoric mood.     Objective:  BP 93/63   Pulse 81   Ht 5\' 3"  (1.6 m)   Wt 94 lb 3.2 oz (42.7 kg)   LMP 12/03/2016 (Approximate)   SpO2 91%   BMI 16.69 kg/m      08/22/2023   10:23 AM 08/10/2023   12:32  AM 08/10/2023   12:00 AM  BP/Weight  Systolic BP 93 90 91  Diastolic BP 63  58 62  Wt. (Lbs) 94.2    BMI 16.69 kg/m2        Physical Exam Constitutional:      Appearance: She is well-developed and underweight.  Cardiovascular:     Rate and Rhythm: Normal rate.     Heart sounds: Normal heart sounds. No murmur heard. Pulmonary:     Effort: Pulmonary effort is normal.     Breath sounds: Normal breath sounds. No wheezing or rales.  Chest:     Chest wall: No tenderness.  Abdominal:     General: Bowel sounds are normal. There is no distension.     Palpations: Abdomen is soft. There is no mass.     Tenderness: There is no abdominal tenderness.     Comments: PEG tube in place, no evidence of discharge around tube site. Tenderness on palpation around the tube and diffusely in abdomen  Musculoskeletal:        General: Normal range of motion.     Right lower leg: No edema.     Left lower leg: No edema.  Neurological:     Mental Status: She is alert and oriented to person, place, and time.  Psychiatric:        Mood and Affect: Mood normal.        Latest Ref Rng & Units 08/09/2023    8:38 PM 05/28/2023    2:54 PM 05/24/2023    4:56 AM  CMP  Glucose 70 - 99 mg/dL 623   85   BUN 6 - 20 mg/dL 12   <5   Creatinine 7.62 - 1.00 mg/dL 8.31   5.17   Sodium 616 - 145 mmol/L 141   142   Potassium 3.5 - 5.1 mmol/L 4.8  4.3  4.1   Chloride 98 - 111 mmol/L 104   103   CO2 22 - 32 mmol/L 28   29   Calcium 8.9 - 10.3 mg/dL 9.6   9.2   Total Protein 6.5 - 8.1 g/dL 7.5     Total Bilirubin <1.2 mg/dL 0.9     Alkaline Phos 38 - 126 U/L 51     AST 15 - 41 U/L 34     ALT 0 - 44 U/L 21       Lipid Panel     Component Value Date/Time   CHOL 214 (H) 01/10/2018 0847   TRIG 113 01/10/2018 0847   HDL 57 01/10/2018 0847   CHOLHDL 3.8 01/10/2018 0847   CHOLHDL 3.5 08/26/2014 1516   VLDL 20 08/26/2014 1516   LDLCALC 134 (H) 01/10/2018 0847    CBC    Component Value Date/Time   WBC 9.3 08/09/2023 2038   RBC 4.45 08/09/2023 2038   HGB 14.0 08/09/2023 2038   HGB  12.6 10/04/2022 1638   HCT 43.1 08/09/2023 2038   HCT 37.6 10/04/2022 1638   PLT PLATELET CLUMPS NOTED ON SMEAR, UNABLE TO ESTIMATE 08/09/2023 2038   PLT 192 10/04/2022 1638   MCV 96.9 08/09/2023 2038   MCV 94 10/04/2022 1638   MCH 31.5 08/09/2023 2038   MCHC 32.5 08/09/2023 2038   RDW 14.5 08/09/2023 2038   RDW 15.1 10/04/2022 1638   LYMPHSABS 1.9 08/09/2023 2038   LYMPHSABS 2.1 10/04/2022 1638   MONOABS 0.4 08/09/2023 2038   EOSABS 0.1 08/09/2023 2038   EOSABS 0.1 10/04/2022 1638   BASOSABS 0.0 08/09/2023  2038   BASOSABS 0.0 10/04/2022 1638    Lab Results  Component Value Date   HGBA1C 5.6 11/15/2022   Lab Results  Component Value Date   TSH 0.226 (L) 05/28/2023     Assessment & Plan:      PEG Tube Site Pain Chronic pain around PEG tube site, exacerbated by movement. No signs of infection or discharge. Pain appears to be both superficial and deep. -Prescribe Emla cream for topical application around PEG tube site. -Start Duloxetine (Cymbalta) for chronic pain management.  Hypothyroidism Last TSH level was low, indicating potential over-treatment and risk of hyperthyroid symptoms. -Order repeat thyroid panel to assess current TSH level. -Adjust Levothyroxine dosage based on lab results.  Bronchiectasis Patient has completed course of Doxycycline. No current symptoms reported. -Continue follow-up with pulmonologist as planned.  Myotonic dystrophy Patient requests outpatient physical therapy. -Submit referral for outpatient physical therapy. -Continue to follow-up with neurology  Mobitz type II heart block -Status post pacemaker -Continue follow-up with cardiology  Hypotension -Med list reveals she is on metoprolol and spironolactone -On further questioning she states she has not been taking either as she was advised by cardiology to stop this  General Health Maintenance / Followup Plans -Ensure all necessary medication refills are sent to the  pharmacy. -Schedule follow-up appointment to assess response to new pain management regimen and review lab results.          Meds ordered this encounter  Medications   lidocaine-prilocaine (EMLA) cream    Sig: Apply 1 Application topically as needed.    Dispense:  30 g    Refill:  1   DULoxetine (CYMBALTA) 60 MG capsule    Sig: Take 1 capsule (60 mg total) by mouth daily. For chronic pain    Dispense:  30 capsule    Refill:  3    Follow-up: Return in about 6 months (around 02/20/2024) for Chronic medical conditions.       Hoy Register, MD, FAAFP. [facility] [place], [place] [phone]   08/22/2023, 12:03 PM

## 2023-08-26 ENCOUNTER — Other Ambulatory Visit: Payer: Self-pay | Admitting: Family Medicine

## 2023-09-04 ENCOUNTER — Encounter: Payer: Self-pay | Admitting: Neurology

## 2023-09-04 ENCOUNTER — Ambulatory Visit: Payer: 59 | Attending: Family Medicine

## 2023-09-04 ENCOUNTER — Ambulatory Visit (INDEPENDENT_AMBULATORY_CARE_PROVIDER_SITE_OTHER): Payer: 59 | Admitting: Neurology

## 2023-09-04 ENCOUNTER — Ambulatory Visit: Payer: 59 | Admitting: Speech Pathology

## 2023-09-04 ENCOUNTER — Telehealth: Payer: Self-pay | Admitting: Family Medicine

## 2023-09-04 VITALS — BP 108/64 | HR 78 | Ht 63.0 in | Wt 87.2 lb

## 2023-09-04 DIAGNOSIS — G7111 Myotonic muscular dystrophy: Secondary | ICD-10-CM

## 2023-09-04 DIAGNOSIS — R1310 Dysphagia, unspecified: Secondary | ICD-10-CM

## 2023-09-04 DIAGNOSIS — Z931 Gastrostomy status: Secondary | ICD-10-CM | POA: Diagnosis not present

## 2023-09-04 DIAGNOSIS — J690 Pneumonitis due to inhalation of food and vomit: Secondary | ICD-10-CM | POA: Diagnosis not present

## 2023-09-04 NOTE — Therapy (Signed)
 OUTPATIENT SPEECH LANGUAGE PATHOLOGY TREATMENT NOTE   Patient Name: Cynthia Underwood MRN: [id number] DOB:05/10/1971, 52 y.o., female Today's Date: 09/04/2023  PCP: Delbert Clam, MD REFERRING PROVIDER: Delbert Clam, MD  END OF SESSION:  End of Session - 09/04/23 1007     Visit Number 2    Number of Visits 17    Date for SLP Re-Evaluation 10/15/23    SLP Start Time 1007    SLP Stop Time  1042    SLP Time Calculation (min) 35 min    Activity Tolerance Patient tolerated treatment well             Past Medical History:  Diagnosis Date   Bifascicular block 10/02/2014   Cataract    Dyspnea    Excess ear wax    Multinodular goiter    Pneumonia    Presence of permanent cardiac pacemaker    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   Sickle cell trait (HCC)    Watery eyes    Left   Weakness of both legs    Past Surgical History:  Procedure Laterality Date   BREAST BIOPSY Right    EYE SURGERY Left 2017   related to blockage in my nose   EYE SURGERY Right 05/2019   INSERT / REPLACE / REMOVE PACEMAKER  02/15/2017   IR GASTROSTOMY TUBE MOD SED  01/01/2023   PACEMAKER IMPLANT N/A 02/15/2017   Procedure: Pacemaker Implant;  Surgeon: Fernande Elspeth BROCKS, MD;  Location: [facility] INVASIVE CV LAB;  Service: Cardiovascular;  Laterality: N/A;   RIGHT/LEFT HEART CATH AND CORONARY ANGIOGRAPHY N/A 11/16/2022   Procedure: RIGHT/LEFT HEART CATH AND CORONARY ANGIOGRAPHY;  Surgeon: Rolan Ezra RAMAN, MD;  Location: [facility] INVASIVE CV LAB;  Service: Cardiovascular;  Laterality: N/A;   THYROIDECTOMY N/A 04/21/2021   Procedure: TOTAL THYROIDECTOMY;  Surgeon: Rubin Calamity, MD;  Location: [facility] OR;  Service: General;  Laterality: N/A;   Patient Active Problem List   Diagnosis Date Noted   Aspiration pneumonia (HCC) 05/21/2023   S/P percutaneous endoscopic gastrostomy (PEG) tube placement (HCC) 03/13/2023   Diarrhea 01/23/2023   SIRS (systemic inflammatory  response syndrome) (HCC) 01/23/2023   Chronic respiratory failure with hypoxia (HCC) 01/23/2023   Dysphagia 01/23/2023   Chronic systolic CHF (congestive heart failure) (HCC) 01/23/2023   Acute on chronic respiratory failure with hypoxia (HCC) 12/07/2022   Tracheostomy status (HCC) 12/07/2022   Generalized anxiety disorder 12/06/2022   Acute systolic heart failure (HCC) 11/26/2022   Drug-induced torsades de pointes (HCC) 11/17/2022   Acute systolic CHF (congestive heart failure) (HCC) 11/16/2022   Acute hypoxemic respiratory failure (HCC) 11/15/2022   Acute on chronic combined systolic and diastolic CHF (congestive heart failure) (HCC) 11/15/2022   Protein-calorie malnutrition, severe 11/15/2022   Cardiac arrest (HCC) 11/14/2022   Healthcare-associated pneumonia 11/11/2022   Hypothyroidism 11/07/2021   Thyroid  nodule 04/21/2021   S/P total thyroidectomy 04/21/2021   Sinus node dysfunction (HCC) 03/10/2020   Weakness of both legs    Watery eyes    Sickle cell trait (HCC)    Presence of permanent cardiac pacemaker    Excess ear wax    Depression    Anemia    Cardiac pacemaker in situ 03/01/2017   Mobitz type 2 second degree heart block 02/15/2017   Symptomatic advanced heart block 02/12/2017   SOB (shortness of breath) 10/02/2014   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   Bifascicular block 10/02/2014  Infection due to trichomonas vaginalis 09/15/2014   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 Reports has  been talking a lot.   PERTINENT HISTORY: myotonic dystophy, hypothyroidism, anxiety. PEG dependence.   PAIN:  Are you having pain? No  FALLS: Has patient fallen in last 6 months?  No  LIVING ENVIRONMENT: Lives with: lives with their family Lives in: House/apartment  PLOF:  Level of assistance: Independent with ADLs, Independent with IADLs Employment: On disability  PATIENT GOALS: Ability to tolerate p.o.  OBJECTIVE:  Note: Objective measures were completed at Evaluation unless otherwise noted. OBJECTIVE:   INSTRUMENTAL SWALLOW STUDY FINDINGS (MBSS) 06/18/2023 Clinical Impression Pt continues to exhibit signifcant pharyngoesophageal dysphagia marked by reduced tongue base retraction, decreased laryngeal elevation, no epiglottic deflection and reduced anterior hyoid excursion. Majority of boluses remained in valleculae and pyriform sinuses with reduced UES opening. Thin liquid with teaspoon penetrated near cord level, cups sip thin penetrated after the swallow (PAS 3), nectar thick penetrated from residue and puree while performing a chin tuck spilled into airway (PAS 3). Pt did throat clear several times throughout study, however overall sensation was reduced. She demonstrated significant difficulty initiating a second swallow stating that her mouth was dry in attempts to clear residue. A left and right head turn and dry swallows were not effective. Esophageal scan revealed stasis in distal esophagus. Recommend pt continue NPO status and use PEG for nutrition. She may have sips of thin water  (only) after oral care, however pt states that she does not like water . She also states that she gets hungry despite her tube feedings. Pt and cousin report she has not been having home health ST and SLP recommends home health ST to work on pharyngeal strength.  COGNITION: Overall cognitive status: Within functional limits for tasks assessed  SUBJECTIVE DYSPHAGIA REPORTS:  Date of onset: March  2024 Reported symptoms: coughing with both solids and liquids, globus sensation, xerostomia, shortness of breath with PO, and weak voice  Current diet:  PEG dependent, thin liquid (water ) PO  Co-morbid voice changes: Yes  Weight Loss: 20 lbs   FACTORS WHICH MAY INCREASE RISK OF ADVERSE EVENT IN PRESENCE OF ASPIRATION:  General health: frail or deconditioned  Risk factors: reduced respiratory function, weak cough, GERD or other GI disease, and tube present (PEG)     ORAL MOTOR EXAMINATION: Overall status: Impaired: overall reduced strength evidenced   CLINICAL SWALLOW ASSESSMENT:   Dentition: dentures (upper), some natural dentition on bottom -- eats without teeth  Vocal quality at baseline: hoarse, low vocal intensity, and vocal fatigue Patient directly observed with POs: Yes: thin liquids  Feeding: able to feed self Liquids provided by: teaspoon and cup Yale Swallow Protocol: Pass Oral phase signs and symptoms: anterior loss/spillage Pharyngeal phase signs and symptoms: multiple swallows, wet vocal quality, delayed throat clear, and delayed cough  PATIENT REPORTED OUTCOME MEASURES (PROM): deferred   TODAY'S TREATMENT:     09/04/23: Pt has been drinking water  to practice swallowing Queried completion of hard swallows per last session, pt endorses completing x20 BID, every other day. Had some mashed potatoes, ground pot roast and green beans for  holiday meal- 2-3 bites of each. Pt reports sensation of it going down, denies globus or sensation of residue. Has had some success with increasing oral care to BID but occasional falls asleep prior to completing in evening. Addressed problem solving for increased compliance with BID oral care-- pt to set alarm for 7pm to remind her to brush teeth to mitigate change of falling asleep without completion.     DYSPHAGIA TREATMENT:   Current diet:  PEG dependent Patient directly observed with POs: Yes: thin liquids  Feeding: able to feed  self Liquids provided by: cup Oral phase signs and symptoms:  na Pharyngeal phase signs and symptoms: immediate throat clear and delayed throat clear Therapeutic exercises: Effortful Swallow and CTAR Types of cueing: verbal and tactile Amount of cueing: moderate Treatment comments: demonstrates understanding of prescribed interventions with increased independence as session progressed.     08/16/23: Education provided on evaluation results and SLP's recommendations. Pt verbalizes agreement with POC, all questions answered to satisfaction. Initiated training re: effortful swallow with tsp thin liquid as dysphagia exercise.  Patient able to demonstrate x 20 effortful swallows this date no overt signs or symptoms of aspiration noted.  Education provided on mitigating risk associated with aspiration with high emphasis placed on routine 3 times daily oral care.   PATIENT EDUCATION: Education details: see above Person educated: Patient and Caregiver cousin Education method: Explanation, Demonstration, Verbal cues, and Handouts Education comprehension: verbalized understanding, returned demonstration, and needs further education   ASSESSMENT:  CLINICAL IMPRESSION: Patient is a 52 y.o. F who was seen today for dysphagia evaluation.Evaluation reveals severe dysphagia. Pt is PEG dependent. See above for physiologic deficits per instrumental swallow study. Addtionally, pt demonstrates hypophonia. Defers to address at this time, is more concerned with swallow function. Pt reports 1x day oral care, water  PO, did consume some solids on Thanksgiving without overt difficulty. Prior to onset of dysphagia, pt ate soft solid diet d/t dentition. Pt would benefit from skilled ST to address aforementioned deficits to improve QoL.    OBJECTIVE IMPAIRMENTS: include dysphagia. These impairments are limiting patient from safety when swallowing. Factors affecting potential to achieve goals and functional outcome are  severity of impairments. Patient will benefit from skilled SLP services to address above impairments and improve overall function.  REHAB POTENTIAL: Good   GOALS: Goals reviewed with patient? Yes  SHORT TERM GOALS: Target date: 09/14/2023  Pt will report daily HEP completion over 1 week period  Baseline: Goal status: INITIAL  2.  Pt will accurately demonstrate dysphagia exercises with min-A Baseline:  Goal status: INITIAL  3.  Pt will increase oral care BID over one week period Baseline:  Goal status: INITIAL   LONG TERM GOALS: Target date: 10/12/2023  Pt will be independent with dysphagia HEP Baseline:  Goal status: INITIAL  2.  Pt will complete MBSS  Baseline:  Goal status: INITIAL  3.  Pt will utilize swallow compensations and strategies to support PO consumption of least restrictive diet based on function demonstrated on repeat instrumental swallow study Baseline:  Goal status: INITIAL   PLAN:  SLP FREQUENCY: 2x/week  SLP DURATION: 8 weeks  PLANNED INTERVENTIONS: 92526 Treatment of swallowing function, Re-evaluation, Aspiration precaution training, Pharyngeal strengthening exercises, Diet toleration management , Trials of upgraded texture/liquids, SLP instruction and feedback, Compensatory strategies, and Patient/family education    Harlene LITTIE Ned, CCC-SLP 09/04/2023, 10:07 AM

## 2023-09-04 NOTE — Telephone Encounter (Signed)
   Pt came in this morning was requesting to see if she could get something different then the type of meds that was prescribed to her she said its hard for her to get it down DULOXETINE (CYMBALTA)

## 2023-09-04 NOTE — Patient Instructions (Addendum)
 Hard Swallow  Tsp of water , hard swallow + Second hard swallow with saliva = 2 hard swallows per tsp  Rest after 10 swallows   Complete x20 for 40 hard swallows total    CTAR - Chin Tuck Against Resistance              - Place towel, ball or pool noodle under your chin             - Hold for 60 seconds 2-3x  a day             - Pulse up and down 20x 2-3x a day

## 2023-09-04 NOTE — Progress Notes (Signed)
 Follow-up Visit   Date: 09/04/23    [name] MRN: [id number] DOB: 04/18/1971   Interim History: Cynthia Underwood is a 52 y.o. 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  The patient was accompanied to the clinic by self.  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  Disease complicated by cataracts, cardiac arrhthymias s/p PPM, dysphagia, and dyspnea. Over the past year, Cynthia Underwood overall health has significantly declined after several hospitalizations for pneumonitis.  Cynthia Underwood is now s/p PEG for dysphagia and also on supplemental oxygen  via nasal canula.  Cynthia Underwood has lost weight and reports getting exhausted very easily.  Cynthia Underwood continues to be independent with ADLs and has assistance with IADLs. As compared to prior visits, Cynthia Underwood does appear very tired with understandably low mood.  Cynthia Underwood has a number of chronic conditions due to complication from Myotonic dystrophy and because of Cynthia Underwood chronic debility, I discussed palliative care, which Cynthia Underwood would like to think about. Cynthia Underwood primary issue currently is chronic abdominal pain at the site of Cynthia Underwood PEG.  Cynthia Underwood had this performed by IR in March during Cynthia Underwood hospitalization. I suggested that Cynthia Underwood contact [facility].  Cynthia Underwood will be continuing speech therapy and PT.     Return to clinic in 6 months   ----------------------------------------------------------------------- History of present illness: Starting around the age of 64, Cynthia Underwood started having lock jaw and stiffness of the hands.  Over the years, Cynthia Underwood developed worsening stiffness of Cynthia Underwood fingers and hands.  Because Cynthia Underwood Cynthia Underwood, who is also a patient of mine, had known myotonic dystrophy, Cynthia Underwood ultimately was genetically  tested at the age of 33 which confirmed the diagnosis of myotonic dystrophy type 1. Cynthia Underwood has a strong family history of DM1 including Cynthia Underwood, cousins x 2, and younger Underwood.  Cynthia Underwood does not have any children and has no future plans for pregnancy.  Symptoms were relatively stable until Cynthia Underwood mid-30s and then started developing fatigue, weakness, daytime sleepiness, and shortness of breath with exertion.  Cynthia Underwood walks independently but was told previously told to use leg braces.  Over the past few years, Cynthia Underwood noticed intermittent difficulty swallowing liquids. Cynthia Underwood underwent barium swallow which showed signs of aspiration and Cynthia Underwood was recommended to use a straw and use chin tuck position.    Cynthia Underwood was seeing several neurologists over the years at [facility] in [place] .  Cynthia Underwood is not working and has been on disability since 2010.  Cynthia Underwood established care with me in February 2016.   In fall of 2016, Cynthia Underwood had to move Cynthia Underwood bedroom to the [place] because of difficulty climbing stairs.  Since completing home PT/OT, Cynthia Underwood muscle strength has improved and sometimes, Cynthia Underwood is able to walk without AFO.  By 2017, Cynthia Underwood was able to climb stairs and went back to sleeping upstairs. Cynthia Underwood was started in Cymbalta  30mg  for mood, fatigue and myalgias which significantly helped, but self-discontinued this in 2018 due to feeling it was ineffective.  In 2022, Cynthia Underwood was tried on gabapentin , but this did not help.   Cynthia Underwood followed by cardiology for abnormal EKG with RBBB and LAFB as well as myocardial thinning.  In 2018, Cynthia Underwood had dual chamber PPM implanted by Dr. Fernande.  Cynthia Underwood is also undergoing pulmonology work-up for dyspnea, which is suggestive  of restrictive changes, consistent with muscular weakness due to DM1.    UPDATE 05/19/2022:  Cynthia Underwood is here for follow-up visit.  Cynthia Underwood continues to have weakness in the arms and legs, difficulty with grip/reaching for objects and climbing stairs.  Cynthia Underwood does not regularly wear Cynthia Underwood AFOs.  Fortunately, Cynthia Underwood  denies any falls.  Cynthia Underwood is has noticed improved swallow after Cynthia Underwood thyroid  was removed earlier this year.  Cynthia Underwood PCP has increased mirtazapine  to 30mg /d, but this has not improved Cynthia Underwood sleep.  Prior sleep study did not show OSA.   UPDATE 09/04/2023:  Cynthia Underwood is here for follow-up visit.  Unfortunately, Cynthia Underwood generalized health had declined over the past year and Cynthia Underwood has been hospitalized for pneumonia several times.  Because of progressive dyspnea, Cynthia Underwood is on supplemental oxygen  3L.  Cynthia Underwood underwent PEG placement in April, but continues to have pain at the site of PEG.  Overall, energy is very low.  Cynthia Underwood has generalized weakness and gets tired very quickly.   Medications:  Current Outpatient Medications on File Prior to Visit  Medication Sig Dispense Refill   acetaminophen  (TYLENOL ) 500 MG tablet Place 500 mg into feeding tube every 6 (six) hours as needed for mild pain.     guaifenesin  (HUMIBID E) 400 MG TABS tablet 400 mg 3 (three) times daily. Place 400mg  into feeding tube three times a day     hydrocortisone -pramoxine (ANALPRAM HC) 2.5-1 % rectal cream Place 1 Application rectally 3 (three) times daily. 30 g 3   ipratropium-albuterol  (DUONEB) 0.5-2.5 (3) MG/3ML SOLN Inhale 3 mLs into the lungs every 4 (four) hours as needed. SMARTSIG:1 Unspecified Via Inhaler Every 4 Hours PRN 360 mL 3   levothyroxine  (SYNTHROID ) 100 MCG tablet PLACE 1 TABLET (100 MCG TOTAL) INTO FEEDING TUBE DAILY BEFORE BREAKFAST. 90 tablet 0   lidocaine -prilocaine  (EMLA ) cream Apply 1 Application topically as needed. 30 g 1   metoCLOPramide  (REGLAN ) 5 MG tablet Place 1 tablet (5 mg total) into feeding tube 3 (three) times daily before meals. 30 tablet 0   metoprolol  tartrate (LOPRESSOR ) 25 MG tablet Place 0.5 tablets (12.5 mg total) into feeding tube 2 (two) times daily. 180 tablet 0   Misc. Devices MISC Eval & titrate pt for poc for portable oxygen  container. If patient qualifies, dispense at 1-6 pulse dose. Please fax to  [phone]. Diagnosis-aspiration pneumonia 1 each 0   Nutritional Supplements (FEEDING SUPPLEMENT, JEVITY 1.5 CAL/FIBER,) LIQD Place 237 mLs into feeding tube 4 (four) times daily. FWF before and after each feeding 1000 mL 30   scopolamine  (TRANSDERM-SCOP) 1 MG/3DAYS Place 1 patch onto the skin every 3 (three) days.     spironolactone  (ALDACTONE ) 25 MG tablet Place 0.5 tablets (12.5 mg total) into feeding tube daily. 90 tablet 0   Water  For Irrigation, Sterile (FREE WATER ) SOLN 100ml free water  via tube before and after each feeding     DULoxetine  (CYMBALTA ) 60 MG capsule Take 1 capsule (60 mg total) by mouth daily. For chronic pain (Patient not taking: Reported on 09/04/2023) 30 capsule 3   morphine  (MSIR) 15 MG tablet Take 1 tablet (15 mg total) by mouth every 6 (six) hours as needed for severe pain (pain score 7-10). (Patient not taking: Reported on 09/04/2023) 15 tablet 0   pantoprazole  (PROTONIX ) 40 MG tablet 40 mg daily. Place 40mg  into the feeding tube daily (Patient not taking: Reported on 09/04/2023)     QUEtiapine  (SEROQUEL ) 25 MG tablet Place 1 tablet (25 mg total) into feeding tube at  bedtime. (Patient not taking: Reported on 09/04/2023)     No current facility-administered medications on file prior to visit.    Allergies:  Allergies  Allergen Reactions   Penicillin G Nausea And Vomiting, Rash and Other (See Comments)    Sore throat Has had cephalosporins in past   Penicillins Nausea And Vomiting, Rash and Other (See Comments)    Fever Has patient had a PCN reaction causing immediate rash, facial/tongue/throat swelling, SOB or lightheadedness with hypotension: Yes Has patient had a PCN reaction causing severe rash involving mucus membranes or skin necrosis: Unknown Has patient had a PCN reaction that required hospitalization: No Has patient had a PCN reaction occurring within the last 10 years: No If all of the above answers are NO, then may proceed with Cephalosporin  use.     Vital Signs:  BP 108/64   Pulse 78   Ht 5' 3 (1.6 m)   Wt 87 lb 3.2 oz (39.6 kg)   LMP 12/03/2016 (Approximate)   SpO2 95% Comment: On 2L  BMI 15.45 kg/m   Neurological Exam: MENTAL STATUS including orientation to time, place, person, recent and remote memory, attention span and concentration, language, and fund of knowledge is normal.  Speech is mildly dysarthric and severely hypophonic.    CRANIAL NERVES:  Normal conjugate, extra-ocular eye movements in all directions of gaze. Mild bilateral ptosis. Typical mytonic facies. Mild facial weakness with buccinator testing. Tongue strength is 5-/5  MOTOR:  Generalized loss of muscle bulk. Mild grip myotonia bilaterally.  Right Upper Extremity:    Left Upper Extremity:    Deltoid  4/5   Deltoid  4/5   Biceps  4/5   Biceps  4/5   Triceps  4/5   Triceps  4/5   Wrist extensors  4/5   Wrist extensors  4/5   Wrist flexors  4/5   Wrist flexors  4/5   Finger extensors  4/5   Finger extensors  4/5   Finger flexors  4/5   Finger flexors  4/5   Dorsal interossei  4-/5   Dorsal interossei  4-/5   Abductor pollicis  4/5   Abductor pollicis  4/5   Tone (Ashworth scale)  0  Tone (Ashworth scale)  0   Right Lower Extremity:    Left Lower Extremity:    Hip flexors  4/5   Hip flexors  4/5   Hip extensors  4/5   Hip extensors  4/5   Knee flexors  4/5   Knee flexors  4/5   Knee extensors  5-/5   Knee extensors  5-/5   Dorsiflexors  4/5   Dorsiflexors  4/5   Plantarflexors  5-/5   Plantarflexors  5-/5   Tone (Ashworth scale)  0  Tone (Ashworth scale)  0   COORDINATION/GAIT:   Gait is stable, mild dragging of the feet bilaterally, unassisted    Data: Lab Results  Component Value Date   HGBA1C 5.6 11/15/2022    Total time spent reviewing records, interview, history/exam, documentation, and coordination of care on day of encounter:  25 min    Thank Cynthia Underwood for allowing me to participate in patient's care.  If I can answer any  additional questions, I would be pleased to do so.    Sincerely,    Sair Faulcon K. Tobie, DO

## 2023-09-04 NOTE — Telephone Encounter (Signed)
Routing to PCP for review.

## 2023-09-05 LAB — T3

## 2023-09-05 LAB — T4, FREE

## 2023-09-05 LAB — TSH

## 2023-09-06 ENCOUNTER — Telehealth: Payer: Self-pay

## 2023-09-06 MED ORDER — METHOCARBAMOL 500 MG PO TABS: 500.0000 mg | ORAL_TABLET | Freq: Three times a day (TID) | ORAL | 1 refills | Status: DC | PRN

## 2023-09-06 NOTE — Telephone Encounter (Signed)
 Pt returned call. Shared provider's note. She will come in to have labs redrawn. Please place order.  Hello, Your thyroid  test collected 2 days ago were canceled by the lab.  When next you are at a [facility] facility, please have your thyroid  labs drawn so we can adjust your levothyroxine  dose accordingly. -Dr. Newlin  Written by Corrina Sabin, MD on 09/06/2023 11:38 AM EST

## 2023-09-06 NOTE — Telephone Encounter (Signed)
 I have sent a prescription for Robaxin to the pharmacy instead since she is unable to take Cymbalta.

## 2023-09-06 NOTE — Telephone Encounter (Signed)
Mailbox is currently full. 

## 2023-09-06 NOTE — Addendum Note (Signed)
 Addended by: Hoy Register on: 09/06/2023 11:26 AM   Modules accepted: Orders

## 2023-09-11 ENCOUNTER — Ambulatory Visit: Payer: 59 | Attending: Family Medicine | Admitting: Speech Pathology

## 2023-09-11 ENCOUNTER — Ambulatory Visit: Payer: 59

## 2023-09-11 ENCOUNTER — Other Ambulatory Visit: Payer: Self-pay

## 2023-09-11 DIAGNOSIS — M6281 Muscle weakness (generalized): Secondary | ICD-10-CM

## 2023-09-11 DIAGNOSIS — R2681 Unsteadiness on feet: Secondary | ICD-10-CM

## 2023-09-11 DIAGNOSIS — Z9181 History of falling: Secondary | ICD-10-CM | POA: Insufficient documentation

## 2023-09-11 DIAGNOSIS — R1310 Dysphagia, unspecified: Secondary | ICD-10-CM | POA: Insufficient documentation

## 2023-09-11 DIAGNOSIS — R29898 Other symptoms and signs involving the musculoskeletal system: Secondary | ICD-10-CM | POA: Diagnosis not present

## 2023-09-11 DIAGNOSIS — R2689 Other abnormalities of gait and mobility: Secondary | ICD-10-CM | POA: Insufficient documentation

## 2023-09-11 NOTE — Therapy (Signed)
 OUTPATIENT PHYSICAL THERAPY NEURO EVALUATION   Patient Name: Cynthia Underwood MRN: [id number] DOB:1971-08-24, 53 y.o., female Today's Date: 09/11/2023   PCP:Dr. Delbert REFERRING PROVIDER: Delbert Clam, MD  END OF SESSION:  PT End of Session - 09/11/23 1159     Visit Number 1    Number of Visits 13    Date for PT Re-Evaluation 11/06/23    Authorization Type [employer] Medicare/Medicaid    PT Start Time 1150    PT Stop Time 1235    PT Time Calculation (min) 45 min    Equipment Utilized During Treatment Gait belt    Activity Tolerance Patient tolerated treatment well    Behavior During Therapy WFL for tasks assessed/performed             Past Medical History:  Diagnosis Date   Bifascicular block 10/02/2014   Cataract    Dyspnea    Excess ear wax    Multinodular goiter    Pneumonia    Presence of permanent cardiac pacemaker    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   Sickle cell trait (HCC)    Watery eyes    Left   Weakness of both legs    Past Surgical History:  Procedure Laterality Date   BREAST BIOPSY Right    EYE SURGERY Left 2017   related to blockage in my nose   EYE SURGERY Right 05/2019   INSERT / REPLACE / REMOVE PACEMAKER  02/15/2017   IR GASTROSTOMY TUBE MOD SED  01/01/2023   PACEMAKER IMPLANT N/A 02/15/2017   Procedure: Pacemaker Implant;  Surgeon: Fernande Elspeth BROCKS, MD;  Location: [facility] INVASIVE CV LAB;  Service: Cardiovascular;  Laterality: N/A;   RIGHT/LEFT HEART CATH AND CORONARY ANGIOGRAPHY N/A 11/16/2022   Procedure: RIGHT/LEFT HEART CATH AND CORONARY ANGIOGRAPHY;  Surgeon: Rolan Ezra RAMAN, MD;  Location: [facility] INVASIVE CV LAB;  Service: Cardiovascular;  Laterality: N/A;   THYROIDECTOMY N/A 04/21/2021   Procedure: TOTAL THYROIDECTOMY;  Surgeon: Rubin Calamity, MD;  Location: [facility] OR;  Service: General;  Laterality: N/A;   Patient Active Problem List   Diagnosis Date Noted   Aspiration pneumonia (HCC) 05/21/2023    S/P percutaneous endoscopic gastrostomy (PEG) tube placement (HCC) 03/13/2023   Diarrhea 01/23/2023   SIRS (systemic inflammatory response syndrome) (HCC) 01/23/2023   Chronic respiratory failure with hypoxia (HCC) 01/23/2023   Dysphagia 01/23/2023   Chronic systolic CHF (congestive heart failure) (HCC) 01/23/2023   Acute on chronic respiratory failure with hypoxia (HCC) 12/07/2022   Tracheostomy status (HCC) 12/07/2022   Generalized anxiety disorder 12/06/2022   Acute systolic heart failure (HCC) 11/26/2022   Drug-induced torsades de pointes (HCC) 11/17/2022   Acute systolic CHF (congestive heart failure) (HCC) 11/16/2022   Acute hypoxemic respiratory failure (HCC) 11/15/2022   Acute on chronic combined systolic and diastolic CHF (congestive heart failure) (HCC) 11/15/2022   Protein-calorie malnutrition, severe 11/15/2022   Cardiac arrest (HCC) 11/14/2022   Healthcare-associated pneumonia 11/11/2022   Hypothyroidism 11/07/2021   Thyroid  nodule 04/21/2021   S/P total thyroidectomy 04/21/2021   Sinus node dysfunction (HCC) 03/10/2020   Weakness of both legs    Watery eyes    Sickle cell trait (HCC)    Presence of permanent cardiac pacemaker    Excess ear wax    Depression    Anemia    Cardiac pacemaker in situ 03/01/2017   Mobitz type 2 second degree heart block 02/15/2017   Symptomatic advanced heart block 02/12/2017   SOB (  shortness of breath) 10/02/2014   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   Bifascicular block 10/02/2014   Infection due to trichomonas vaginalis 09/15/2014   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    ONSET DATE: 08/22/2023  REFERRING DIAG: G71.11 (ICD-10-CM) - Myotonic dystrophy, type 1 (HCC)  THERAPY DIAG:  Muscle weakness (generalized)  Other abnormalities of gait and mobility  Unsteadiness on feet  Rationale for Evaluation and Treatment: Rehabilitation  SUBJECTIVE:                                                                                                                                                                                              SUBJECTIVE STATEMENT: Pt lives with cousin at home. Pt lives in home with 12 steps to go up and rail on R side. Patient has myotonic dystrophy and wants to work on getting her legs stronger and improve her walking. Pt is on supplemental O2 on 3 L. Pt denies falls within last 6 months.  Pt accompanied by: self  PERTINENT HISTORY: Starting around the age of 64, she started having lock jaw and stiffness of the hands.  Over the years, she developed worsening stiffness of her fingers and hands.  she ultimately was genetically tested at the age of 32 which confirmed the diagnosis of myotonic dystrophy type 1. She has a strong family history of DM1 including maternal aunt, cousins x 2, and younger sister.  She does not have any children and has no future plans for pregnancy.   Symptoms were relatively stable until her mid-30s and then started developing fatigue, weakness, daytime sleepiness, and shortness of breath with exertion.  She walks independently but was told previously told to use leg braces.  Over the past few years, she noticed intermittent difficulty swallowing liquids. She underwent barium swallow which showed signs of aspiration and she was recommended to use a straw and use chin tuck position.     She was seeing several neurologists over the years at [facility] in [place] .  She is not working and has been on disability since 2010.  PAIN:  Are you having pain? Yes: NPRS scale: 8/10 Pain location: GI tube insertion Pain description: achy Aggravating factors: none Relieving factors: none  PRECAUTIONS: None  RED FLAGS: None   WEIGHT BEARING RESTRICTIONS: No  FALLS: Has patient fallen in last 6 months? No  LIVING ENVIRONMENT: Lives with:  cousin Lives in: House/apartment Stairs: Yes: Internal: 12 steps; on right going up Has  following equipment at home: Vannie - 4 wheeled  PLOF: Needs assistance with ADLs and Needs assistance with homemaking  PATIENT GOALS: improve walking.   OBJECTIVE:  Note: Objective measures were completed at Evaluation unless otherwise noted. COGNITION: Overall cognitive status: Within functional limits for tasks assessed  LOWER EXTREMITY ROM:     Active  Right Eval Left Eval  Hip flexion    Hip extension    Hip abduction    Hip adduction    Hip internal rotation    Hip external rotation    Knee flexion    Knee extension    Ankle dorsiflexion    Ankle plantarflexion    Ankle inversion    Ankle eversion     (Blank rows = not tested)  LOWER EXTREMITY MMT:    MMT Right Eval Left Eval  Hip flexion    Hip extension    Hip abduction    Hip adduction    Hip internal rotation    Hip external rotation    Knee flexion    Knee extension    Ankle dorsiflexion    Ankle plantarflexion    Ankle inversion    Ankle eversion    (Blank rows = not tested)  BED MOBILITY:  Sit to supine Complete Independence Supine to sit Complete Independence Rolling to Right Complete Independence Rolling to Left Complete Independence  TRANSFERS: Assistive device utilized: [employer]ant - 4 wheeled  Sit to stand: Modified independence Stand to sit: Modified independence Chair to chair: Modified independence  GAIT: Gait pattern: decreased arm swing- Right, decreased arm swing- Left, decreased step length- Right, decreased step length- Left, Right foot flat, Left foot flat, knee flexed in stance- Right, knee flexed in stance- Left, decreased trunk rotation, trunk flexed, poor foot clearance- Right, and poor foot clearance- Left Distance walked: 530' Assistive device utilized: Environmental Consultant - 4 wheeled Level of assistance: SBA   FUNCTIONAL TESTS:  5 times sit to stand: 13 sec no UE support 6 minute walk test: SpO2 before walk was 96% 530 feet with RW and one standing break. SpO2 during break wa 98%,  pt on 3L of O2.                                                                                                                                TREATMENT DATE: N/A   Discussed evaluation findings and discussed goals of therapy with patient.  PATIENT EDUCATION: Education details: see above Person educated: Patient Education method: Explanation Education comprehension: verbalized understanding  HOME EXERCISE PROGRAM: TBD  GOALS: Goals reviewed with patient? Yes  SHORT TERM GOALS: Target date: 10/09/2023    Patient will be 50% compliant with Hep to self manage her symptoms.  Baseline: TBD Goal status: INITIAL     LONG TERM GOALS: Target date: 11/06/2023    Patient will demo >700' of ambulation in 6 minutes with RW to improve walking endurance.  Baseline: 530' RW (09/11/23) Goal status: INITIAL  2.  Patient will be able to perform SLS of >5 sec on R and L LE  to improve static balance.  Baseline: 1 sec on L LE; 2 sec on R LE (09/11/23) Goal status: INITIAL  3.  Pt will perform 5x sit to stand without UE support in <10 sec to improve overall functional strength.  Baseline: 13 sec without UE support (09/11/23) Goal status: INITIAL  4.  Pt will demo 100% compliance with HEP to self manage her symptoms . Baseline: TBD Goal status: INITIAL   ASSESSMENT:  CLINICAL IMPRESSION: Patient is a 53 y.o. female who was seen today for physical therapy evaluation and treatment for gait and mobility disorder and muscle weakness due to myotonic dystrophy. Patient demo decreased functional strength with 5x sit to stand and decreased gait endurance (6 MWT) and decreased static balance (single leg stance). Due to limited cardiovascular endurance (pt currently on 3 L Of O2), patient demonstrates decreased standing and walking endurance. Due to poor static balance and limited endurance, patient will benefit from shower chair to improve safety during the shower and reduce fall risk. Patient will  benefit from skilled PT to address these impairments and improve overall function. .   OBJECTIVE IMPAIRMENTS: Abnormal gait, decreased activity tolerance, decreased balance, decreased endurance, decreased mobility, difficulty walking, decreased strength, impaired flexibility, impaired tone, impaired UE functional use, postural dysfunction, and pain.   ACTIVITY LIMITATIONS: carrying, lifting, bending, standing, squatting, stairs, transfers, and bathing  PARTICIPATION LIMITATIONS: meal prep, cleaning, laundry, driving, shopping, community activity, and yard work  PERSONAL FACTORS: Age and Time since onset of injury/illness/exacerbation are also affecting patient's functional outcome.   REHAB POTENTIAL: Good  CLINICAL DECISION MAKING: Stable/uncomplicated  EVALUATION COMPLEXITY: Low  PLAN:  PT FREQUENCY: 2x/week  PT DURATION: 6 weeks  PLANNED INTERVENTIONS: 97164- PT Re-evaluation, 97110-Therapeutic exercises, 97530- Therapeutic activity, 97112- Neuromuscular re-education, 97535- Self Care, [id number]- Manual therapy, [phone]- Gait training, Patient/Family education, Balance training, Stair training, Joint mobilization, DME instructions, Cryotherapy, and Moist heat  PLAN FOR NEXT SESSION:  Aquatic therapy discussed as pt was previously receiving it. Pt does't want aquatic therapy during this episode. Issue HEP Check on orders for shower chair.   Raj LOISE Blanch, PT 09/11/2023, 12:00 PM

## 2023-09-11 NOTE — Therapy (Signed)
 OUTPATIENT SPEECH LANGUAGE PATHOLOGY TREATMENT NOTE   Patient Name: Cynthia Underwood MRN: [id number] DOB:03-12-1971, 53 y.o., female Today's Date: 09/11/2023  PCP: Delbert Clam, MD REFERRING PROVIDER: Delbert Clam, MD  END OF SESSION:  End of Session - 09/11/23 1227     Visit Number 3    Number of Visits 17    Date for SLP Re-Evaluation 10/15/23    SLP Start Time 1227    SLP Stop Time  1302    SLP Time Calculation (min) 35 min    Activity Tolerance Patient tolerated treatment well             Past Medical History:  Diagnosis Date   Bifascicular block 10/02/2014   Cataract    Dyspnea    Excess ear wax    Multinodular goiter    Pneumonia    Presence of permanent cardiac pacemaker    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   Sickle cell trait (HCC)    Watery eyes    Left   Weakness of both legs    Past Surgical History:  Procedure Laterality Date   BREAST BIOPSY Right    EYE SURGERY Left 2017   related to blockage in my nose   EYE SURGERY Right 05/2019   INSERT / REPLACE / REMOVE PACEMAKER  02/15/2017   IR GASTROSTOMY TUBE MOD SED  01/01/2023   PACEMAKER IMPLANT N/A 02/15/2017   Procedure: Pacemaker Implant;  Surgeon: Fernande Elspeth BROCKS, MD;  Location: [facility] INVASIVE CV LAB;  Service: Cardiovascular;  Laterality: N/A;   RIGHT/LEFT HEART CATH AND CORONARY ANGIOGRAPHY N/A 11/16/2022   Procedure: RIGHT/LEFT HEART CATH AND CORONARY ANGIOGRAPHY;  Surgeon: Rolan Ezra RAMAN, MD;  Location: [facility] INVASIVE CV LAB;  Service: Cardiovascular;  Laterality: N/A;   THYROIDECTOMY N/A 04/21/2021   Procedure: TOTAL THYROIDECTOMY;  Surgeon: Rubin Calamity, MD;  Location: [facility] OR;  Service: General;  Laterality: N/A;   Patient Active Problem List   Diagnosis Date Noted   Aspiration pneumonia (HCC) 05/21/2023   S/P percutaneous endoscopic gastrostomy (PEG) tube placement (HCC) 03/13/2023   Diarrhea 01/23/2023   SIRS (systemic inflammatory  response syndrome) (HCC) 01/23/2023   Chronic respiratory failure with hypoxia (HCC) 01/23/2023   Dysphagia 01/23/2023   Chronic systolic CHF (congestive heart failure) (HCC) 01/23/2023   Acute on chronic respiratory failure with hypoxia (HCC) 12/07/2022   Tracheostomy status (HCC) 12/07/2022   Generalized anxiety disorder 12/06/2022   Acute systolic heart failure (HCC) 11/26/2022   Drug-induced torsades de pointes (HCC) 11/17/2022   Acute systolic CHF (congestive heart failure) (HCC) 11/16/2022   Acute hypoxemic respiratory failure (HCC) 11/15/2022   Acute on chronic combined systolic and diastolic CHF (congestive heart failure) (HCC) 11/15/2022   Protein-calorie malnutrition, severe 11/15/2022   Cardiac arrest (HCC) 11/14/2022   Healthcare-associated pneumonia 11/11/2022   Hypothyroidism 11/07/2021   Thyroid  nodule 04/21/2021   S/P total thyroidectomy 04/21/2021   Sinus node dysfunction (HCC) 03/10/2020   Weakness of both legs    Watery eyes    Sickle cell trait (HCC)    Presence of permanent cardiac pacemaker    Excess ear wax    Depression    Anemia    Cardiac pacemaker in situ 03/01/2017   Mobitz type 2 second degree heart block 02/15/2017   Symptomatic advanced heart block 02/12/2017   SOB (shortness of breath) 10/02/2014   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   Bifascicular block 10/02/2014  Infection due to trichomonas vaginalis 09/15/2014   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 Reports to increased weakness over  past week. Presents with productive cough.   PERTINENT HISTORY: myotonic dystophy, hypothyroidism, anxiety. PEG dependence.   PAIN:  Are you having pain?  8/10 G-tube site, improves with pain meds, eating/drinking worsen pain   FALLS: Has patient fallen in last 6 months?  No  LIVING ENVIRONMENT: Lives with: lives with their family Lives in: House/apartment  PLOF:  Level of assistance: Independent with ADLs, Independent with IADLs Employment: On disability  PATIENT GOALS: Ability to tolerate p.o.  OBJECTIVE:  Note: Objective measures were completed at Evaluation unless otherwise noted. OBJECTIVE:   INSTRUMENTAL SWALLOW STUDY FINDINGS (MBSS) 06/18/2023 Clinical Impression Pt continues to exhibit signifcant pharyngoesophageal dysphagia marked by reduced tongue base retraction, decreased laryngeal elevation, no epiglottic deflection and reduced anterior hyoid excursion. Majority of boluses remained in valleculae and pyriform sinuses with reduced UES opening. Thin liquid with teaspoon penetrated near cord level, cups sip thin penetrated after the swallow (PAS 3), nectar thick penetrated from residue and puree while performing a chin tuck spilled into airway (PAS 3). Pt did throat clear several times throughout study, however overall sensation was reduced. She demonstrated significant difficulty initiating a second swallow stating that her mouth was dry in attempts to clear residue. A left and right head turn and dry swallows were not effective. Esophageal scan revealed stasis in distal esophagus. Recommend pt continue NPO status and use PEG for nutrition. She may have sips of thin water  (only) after oral care, however pt states that she does not like water . She also states that she gets hungry despite her tube feedings. Pt and cousin report she has not been having home health ST and SLP recommends home health ST to work on pharyngeal strength.  COGNITION: Overall cognitive status: Within  functional limits for tasks assessed  SUBJECTIVE DYSPHAGIA REPORTS:  Date of onset: March 2024 Reported symptoms: coughing with both solids and liquids, globus sensation, xerostomia, shortness of breath with PO, and weak voice  Current diet:  PEG dependent, thin liquid (water ) PO  Co-morbid voice changes: Yes  Weight Loss: 20 lbs   FACTORS WHICH MAY INCREASE RISK OF ADVERSE EVENT IN PRESENCE OF ASPIRATION:  General health: frail or deconditioned  Risk factors: reduced respiratory function, weak cough, GERD or other GI disease, and tube present (PEG)     ORAL MOTOR EXAMINATION: Overall status: Impaired: overall reduced strength evidenced   CLINICAL SWALLOW ASSESSMENT:   Dentition: dentures (upper), some natural dentition on bottom -- eats without teeth  Vocal quality at baseline: hoarse, low vocal intensity, and vocal fatigue Patient directly observed with POs: Yes: thin liquids  Feeding: able to feed self Liquids provided by: teaspoon and cup Yale Swallow Protocol: Pass Oral phase signs and symptoms: anterior loss/spillage Pharyngeal phase signs and symptoms: multiple swallows, wet vocal quality, delayed throat clear, and delayed cough  PATIENT REPORTED OUTCOME MEASURES (PROM): deferred   TODAY'S TREATMENT:     09/10/22: Reports ate 2/3 chicken sandwich following last tx session. Pt denies globus or residue sensation. Reviewed HEP, reviewed recommendation for  BID oral care, at minimum. Education provided re: nature and severity of pt's swallow deficits and potential sequelae 2/2 to, she demonstrates limited understanding. Led pt through iteration of dysphagia exercises, see below.      DYSPHAGIA TREATMENT:   Current diet:  PEG dependent Patient directly observed with POs: Yes: thin liquids  Feeding: able to feed self Liquids provided by: cup Oral phase signs and symptoms:  na Pharyngeal phase signs and symptoms: immediate throat clear and delayed throat clear Therapeutic  exercises:  Effortful Swallow: x30 with every third using thin liquid stimulus  CTAR: :60 second isometric hold x1, 3 second isokinetic pulsing x10  [place]ako: x5 attempt with pt able to demonstrate laryngeal elevation but unable to elicit complete swallow  Types of cueing: verbal and tactile Amount of cueing: moderate Treatment comments: demonstrates understanding of prescribed interventions with increased independence as session progressed.     PATIENT EDUCATION: Education details: see above Person educated: Patient and Caregiver cousin Education method: Explanation, Demonstration, Verbal cues, and Handouts Education comprehension: verbalized understanding, returned demonstration, and needs further education   ASSESSMENT:  CLINICAL IMPRESSION: Patient is a 53 y.o. F who was seen today for dysphagia evaluation.Evaluation reveals severe dysphagia. Pt is PEG dependent. See above for physiologic deficits per instrumental swallow study. Addtionally, pt demonstrates hypophonia. Defers to address at this time, is more concerned with swallow function. Pt reports 1x day oral care, water  PO, did consume some solids on Thanksgiving without overt difficulty. Prior to onset of dysphagia, pt ate soft solid diet d/t dentition. Pt would benefit from skilled ST to address aforementioned deficits to improve QoL.    OBJECTIVE IMPAIRMENTS: include dysphagia. These impairments are limiting patient from safety when swallowing. Factors affecting potential to achieve goals and functional outcome are severity of impairments. Patient will benefit from skilled SLP services to address above impairments and improve overall function.  REHAB POTENTIAL: Good   GOALS: Goals reviewed with patient? Yes  SHORT TERM GOALS: Target date: 09/14/2023  Pt will report daily HEP completion over 1 week period  Baseline: Goal status: INITIAL  2.  Pt will accurately demonstrate dysphagia exercises with min-A Baseline:  Goal  status: INITIAL  3.  Pt will increase oral care BID over one week period Baseline:  Goal status: INITIAL   LONG TERM GOALS: Target date: 10/12/2023  Pt will be independent with dysphagia HEP Baseline:  Goal status: INITIAL  2.  Pt will complete MBSS  Baseline:  Goal status: INITIAL  3.  Pt will utilize swallow compensations and strategies to support PO consumption of least restrictive diet based on function demonstrated on repeat instrumental swallow study Baseline:  Goal status: INITIAL   PLAN:  SLP FREQUENCY: 2x/week  SLP DURATION: 8 weeks  PLANNED INTERVENTIONS: 92526 Treatment of swallowing function, Re-evaluation, Aspiration precaution training, Pharyngeal strengthening exercises, Diet toleration management , Trials of upgraded texture/liquids, SLP instruction and feedback, Compensatory strategies, and Patient/family education    Harlene LITTIE Ned, CCC-SLP 09/11/2023, 12:28 PM

## 2023-09-12 ENCOUNTER — Other Ambulatory Visit: Payer: Self-pay | Admitting: Family Medicine

## 2023-09-12 ENCOUNTER — Ambulatory Visit: Payer: 59 | Admitting: Licensed Clinical Social Worker

## 2023-09-12 MED ORDER — MISC. DEVICES MISC: 0 refills | Status: DC

## 2023-09-13 ENCOUNTER — Ambulatory Visit: Payer: Self-pay

## 2023-09-13 NOTE — Patient Instructions (Signed)
 Visit Information  Thank you for taking time to visit with me today. Please don't hesitate to contact me if I can be of assistance to you.   Following are the goals we discussed today:   Goals Addressed             This Visit's Progress    To be able to eat and drink normally and regain my speech   On track    Care Coordination Interventions: Completed outbound call with patient and sister Tahirih Lair  Evaluation of current treatment plan related to Impaired Speech and Swallowing and patient's adherence to plan as established by provider Discussed and reviewed ST plan of care with sister Camillia, including short term and long term goals per outpatient ST;  GOALS: Goals reviewed with patient? Yes SHORT TERM GOALS: Target date: 09/14/2023   Pt will report daily HEP completion over 1 week period  Baseline: Goal status: INITIAL 2.  Pt will accurately demonstrate dysphagia exercises with min-A Baseline:  Goal status: INITIAL 3.  Pt will increase oral care BID over one week period Baseline:  Goal status: INITIAL LONG TERM GOALS: Target date: 10/12/2023   Pt will be independent with dysphagia HEP Baseline:  Goal status: INITIAL 2.  Pt will complete MBSS  Baseline:  Goal status: INITIAL 3.  Pt will utilize swallow compensations and strategies to support PO consumption of least restrictive diet based on function demonstrated on repeat instrumental swallow study Baseline:  Goal status: INITIAL  PLAN: SLP FREQUENCY: 2x/week SLP DURATION: 8 weeks PLANNED INTERVENTIONS: 92526 Treatment of swallowing function, Re-evaluation, Aspiration precaution training, Pharyngeal strengthening exercises, Diet toleration management , Trials of upgraded texture/liquids, SLP instruction and feedback, Compensatory strategies, and Patient/family education Discussed with sister Camillia, patient remains to be stable and has gained approximately 5 lbs Reviewed and discussed patients next scheduled follow up  with Dr. Newlin is scheduled for 02/20/24 @4 :10 PM        Our next appointment is by telephone on 11/12/23 at 09:30 AM  Please call the care guide team at [phone] if you need to cancel or reschedule your appointment.   If you are experiencing a Mental Health or Behavioral Health Crisis or need someone to talk to, please call 1-800-273-TALK (toll free, 24 hour hotline)  Patient verbalizes understanding of instructions and care plan provided today and agrees to view in MyChart. Active MyChart status and patient understanding of how to access instructions and care plan via MyChart confirmed with patient.     Clayborne Ly RN BSN CCM [place]  [facility], [facility] Nurse Care Coordinator  Direct Dial: [phone] Website: [email or web address]

## 2023-09-13 NOTE — Patient Outreach (Signed)
  Care Coordination   Follow Up Visit Note   09/13/2023 Name: Cynthia Underwood MRN: [id number] DOB: 05/02/1971  Cynthia Underwood is a 53 y.o. year old female who sees Newlin, Enobong, MD for primary care. I spoke with  Cynthia Underwood by phone today.  What matters to the patients health and wellness today?  Patient would like to continue to gain weight and avoid aspirating.    Goals Addressed             This Visit's Progress    To be able to eat and drink normally and regain my speech   On track    Care Coordination Interventions: Completed outbound call with patient and sister Zerenity Bowron  Evaluation of current treatment plan related to Impaired Speech and Swallowing and patient's adherence to plan as established by provider Discussed and reviewed ST plan of care with sister Camillia, including short term and long term goals per outpatient ST;  GOALS: Goals reviewed with patient? Yes SHORT TERM GOALS: Target date: 09/14/2023   Pt will report daily HEP completion over 1 week period  Baseline: Goal status: INITIAL 2.  Pt will accurately demonstrate dysphagia exercises with min-A Baseline:  Goal status: INITIAL 3.  Pt will increase oral care BID over one week period Baseline:  Goal status: INITIAL LONG TERM GOALS: Target date: 10/12/2023   Pt will be independent with dysphagia HEP Baseline:  Goal status: INITIAL 2.  Pt will complete MBSS  Baseline:  Goal status: INITIAL 3.  Pt will utilize swallow compensations and strategies to support PO consumption of least restrictive diet based on function demonstrated on repeat instrumental swallow study Baseline:  Goal status: INITIAL  PLAN: SLP FREQUENCY: 2x/week SLP DURATION: 8 weeks PLANNED INTERVENTIONS: 92526 Treatment of swallowing function, Re-evaluation, Aspiration precaution training, Pharyngeal strengthening exercises, Diet toleration management , Trials of upgraded texture/liquids, SLP instruction and feedback,  Compensatory strategies, and Patient/family education Discussed with sister Camillia, patient remains to be stable and has gained approximately 5 lbs Reviewed and discussed patients next scheduled follow up with Dr. Newlin is scheduled for 02/20/24 @4 :10 PM    Interventions Today    Flowsheet Row Most Recent Value  Chronic Disease   Chronic disease during today's visit Other  [dysphagia]  General Interventions   General Interventions Discussed/Reviewed General Interventions Discussed, General Interventions Reviewed, Doctor Visits, Durable Medical Equipment (DME)  Doctor Visits Discussed/Reviewed Doctor Visits Discussed, Doctor Visits Reviewed, PCP  Durable Medical Equipment (DME) Vannie, Oxygen   Exercise Interventions   Exercise Discussed/Reviewed Physical Activity, Exercise Reviewed, Exercise Discussed  Physical Activity Discussed/Reviewed Physical Activity Discussed, Physical Activity Reviewed, Home Exercise Program (HEP)  Education Interventions   Education Provided Provided Education  Provided Verbal Education On Exercise, When to see the doctor  Safety Interventions   Safety Discussed/Reviewed Safety Discussed, Safety Reviewed, Fall Risk, Home Safety  Home Safety Assistive Devices          SDOH assessments and interventions completed:  No     Care Coordination Interventions:  Yes, provided   Follow up plan: Follow up call scheduled for 11/12/23 @9 :30 AM    Encounter Outcome:  Patient Visit Completed

## 2023-09-14 ENCOUNTER — Ambulatory Visit: Payer: 59 | Admitting: Speech Pathology

## 2023-09-14 ENCOUNTER — Ambulatory Visit: Payer: 59

## 2023-09-14 ENCOUNTER — Other Ambulatory Visit: Payer: Self-pay | Admitting: Family Medicine

## 2023-09-14 DIAGNOSIS — R2681 Unsteadiness on feet: Secondary | ICD-10-CM | POA: Diagnosis not present

## 2023-09-14 DIAGNOSIS — M6281 Muscle weakness (generalized): Secondary | ICD-10-CM

## 2023-09-14 DIAGNOSIS — R2689 Other abnormalities of gait and mobility: Secondary | ICD-10-CM

## 2023-09-14 DIAGNOSIS — R1310 Dysphagia, unspecified: Secondary | ICD-10-CM | POA: Diagnosis not present

## 2023-09-14 DIAGNOSIS — Z9181 History of falling: Secondary | ICD-10-CM | POA: Diagnosis not present

## 2023-09-14 DIAGNOSIS — R29898 Other symptoms and signs involving the musculoskeletal system: Secondary | ICD-10-CM | POA: Diagnosis not present

## 2023-09-14 MED ORDER — MISC. DEVICES MISC: 0 refills | Status: DC

## 2023-09-14 NOTE — Therapy (Signed)
 OUTPATIENT PHYSICAL THERAPY NEURO EVALUATION   Patient Name: Cynthia Underwood MRN: [id number] DOB:February 15, 1971, 53 y.o., female Today's Date: 09/14/2023   PCP:Dr. Delbert REFERRING PROVIDER: Delbert Clam, MD  END OF SESSION:  PT End of Session - 09/14/23 0859     Visit Number 2    Number of Visits 13    Date for PT Re-Evaluation 11/06/23    Authorization Type [employer]HC Medicare/Medicaid    PT Start Time 0845    PT Stop Time 0930    PT Time Calculation (min) 45 min    Activity Tolerance Patient tolerated treatment well    Behavior During Therapy [facility] for tasks assessed/performed             Past Medical History:  Diagnosis Date   Bifascicular block 10/02/2014   Cataract    Dyspnea    Excess ear wax    Multinodular goiter    Pneumonia    Presence of permanent cardiac pacemaker    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   Sickle cell trait (HCC)    Watery eyes    Left   Weakness of both legs    Past Surgical History:  Procedure Laterality Date   BREAST BIOPSY Right    EYE SURGERY Left 2017   related to blockage in my nose   EYE SURGERY Right 05/2019   INSERT / REPLACE / REMOVE PACEMAKER  02/15/2017   IR GASTROSTOMY TUBE MOD SED  01/01/2023   PACEMAKER IMPLANT N/A 02/15/2017   Procedure: Pacemaker Implant;  Surgeon: Fernande Elspeth BROCKS, MD;  Location: [facility] INVASIVE CV LAB;  Service: Cardiovascular;  Laterality: N/A;   RIGHT/LEFT HEART CATH AND CORONARY ANGIOGRAPHY N/A 11/16/2022   Procedure: RIGHT/LEFT HEART CATH AND CORONARY ANGIOGRAPHY;  Surgeon: Rolan Ezra RAMAN, MD;  Location: [facility] INVASIVE CV LAB;  Service: Cardiovascular;  Laterality: N/A;   THYROIDECTOMY N/A 04/21/2021   Procedure: TOTAL THYROIDECTOMY;  Surgeon: Rubin Calamity, MD;  Location: [facility];  Service: General;  Laterality: N/A;   Patient Active Problem List   Diagnosis Date Noted   Aspiration pneumonia (HCC) 05/21/2023   S/P percutaneous endoscopic gastrostomy (PEG)  tube placement (HCC) 03/13/2023   Diarrhea 01/23/2023   SIRS (systemic inflammatory response syndrome) (HCC) 01/23/2023   Chronic respiratory failure with hypoxia (HCC) 01/23/2023   Dysphagia 01/23/2023   Chronic systolic CHF (congestive heart failure) (HCC) 01/23/2023   Acute on chronic respiratory failure with hypoxia (HCC) 12/07/2022   Tracheostomy status (HCC) 12/07/2022   Generalized anxiety disorder 12/06/2022   Acute systolic heart failure (HCC) 11/26/2022   Drug-induced torsades de pointes (HCC) 11/17/2022   Acute systolic CHF (congestive heart failure) (HCC) 11/16/2022   Acute hypoxemic respiratory failure (HCC) 11/15/2022   Acute on chronic combined systolic and diastolic CHF (congestive heart failure) (HCC) 11/15/2022   Protein-calorie malnutrition, severe 11/15/2022   Cardiac arrest (HCC) 11/14/2022   Healthcare-associated pneumonia 11/11/2022   Hypothyroidism 11/07/2021   Thyroid  nodule 04/21/2021   S/P total thyroidectomy 04/21/2021   Sinus node dysfunction (HCC) 03/10/2020   Weakness of both legs    Watery eyes    Sickle cell trait (HCC)    Presence of permanent cardiac pacemaker    Excess ear wax    Depression    Anemia    Cardiac pacemaker in situ 03/01/2017   Mobitz type 2 second degree heart block 02/15/2017   Symptomatic advanced heart block 02/12/2017   SOB (shortness of breath) 10/02/2014   RBBB (right bundle  branch block with left anterior fascicular block) 10/02/2014   Bifascicular block 10/02/2014   Infection due to trichomonas vaginalis 09/15/2014   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 Pt accompanied by: self  PERTINENT HISTORY: Starting around the age of 71, she started having lock jaw and stiffness of the hands.  Over the years, she developed worsening stiffness of her fingers and hands.  she ultimately was genetically tested at the age of 53 which confirmed the diagnosis of myotonic dystrophy type 1. She has a strong family history of DM1 including maternal aunt, cousins x 2, and younger sister.  She does not have any children and has no future plans for pregnancy.   Symptoms were relatively stable until her mid-30s and then started developing fatigue, weakness, daytime sleepiness, and shortness of breath with exertion.  She walks independently but was told previously told to use leg braces.  Over the past few years, she noticed intermittent difficulty swallowing liquids. She underwent barium swallow which showed signs of aspiration and she was recommended to use a straw and use chin tuck position.     She was seeing several neurologists over the years at [facility] in [place] .  She is not working and has been on disability since 2010.  PAIN:  Are you having pain? Yes: NPRS scale: 8/10 Pain location: GI tube insertion Pain description: achy Aggravating factors: none Relieving factors: none  PRECAUTIONS: None  RED FLAGS: None   WEIGHT BEARING RESTRICTIONS: No  FALLS: Has patient fallen in last 6 months? No  LIVING ENVIRONMENT: Lives with:  cousin Lives in: House/apartment Stairs: Yes: Internal: 12 steps; on right going up Has following equipment at home: Walker - 4 wheeled  PLOF: Needs assistance with ADLs and Needs assistance with homemaking  PATIENT GOALS: improve walking.   OBJECTIVE:  Note: Objective measures were completed at Evaluation unless otherwise noted. COGNITION: Overall cognitive  status: Within functional limits for tasks assessed  LOWER EXTREMITY ROM:     Active  Right Eval Left Eval  Hip flexion    Hip extension    Hip abduction  Hip adduction    Hip internal rotation    Hip external rotation    Knee flexion    Knee extension    Ankle dorsiflexion    Ankle plantarflexion    Ankle inversion    Ankle eversion     (Blank rows = not tested)  LOWER EXTREMITY MMT:    MMT Right Eval Left Eval  Hip flexion    Hip extension    Hip abduction    Hip adduction    Hip internal rotation    Hip external rotation    Knee flexion    Knee extension    Ankle dorsiflexion    Ankle plantarflexion    Ankle inversion    Ankle eversion    (Blank rows = not tested)  BED MOBILITY:  Sit to supine Complete Independence Supine to sit Complete Independence Rolling to Right Complete Independence Rolling to Left Complete Independence  TRANSFERS: Assistive device utilized: [employer]ant - 4 wheeled  Sit to stand: Modified independence Stand to sit: Modified independence Chair to chair: Modified independence  GAIT: Gait pattern: decreased arm swing- Right, decreased arm swing- Left, decreased step length- Right, decreased step length- Left, Right foot flat, Left foot flat, knee flexed in stance- Right, knee flexed in stance- Left, decreased trunk rotation, trunk flexed, poor foot clearance- Right, and poor foot clearance- Left Distance walked: 530' Assistive device utilized: Environmental Consultant - 4 wheeled Level of assistance: SBA   FUNCTIONAL TESTS:  5 times sit to stand: 13 sec no UE support 6 minute walk test: SpO2 before walk was 96% 530 feet with RW and one standing break. SpO2 during break wa 98%, pt on 3L of O2.  Ankle AROM: (09/14/23)  Dorsiflexion: R = 10 deg; L = lacks 8 deg to neutral with increased supination noted in L foot.  Plantarflexion: R = 40 deg; L  = 20 deg                                                                                                                               TREATMENT DATE: N/A   09/14/23 Pt did not bring her rolator today Attempted single knee to chest stretch but patient was feeling pain in anterior hip bil so deferred Supine hooklying marching: 20x R and L Supine double knee to chest: 2 x 10 SLR: 3 x 5 R and L Supine bridge: 2 x 10 Standing heel and toe raises: pt didn't have adequate strength or ROM to lift body weight high so deferred and performed in sitting Seated ankle PF and DF: 2 x 10 bil Passive stretching of L ankle into plantaflexion, dorsiflexion, eversion Supine hooklying manually resisted ankle dorsiflexion: 20x L only  PATIENT EDUCATION: Education details: see above Person educated: Patient Education method: Explanation Education comprehension: verbalized understanding  HOME EXERCISE PROGRAM: Access Code: [email or web address] URL: [email or web address] Date: 09/14/2023 Prepared by: Raj Blanch  Exercises - Supine March  - 1 x daily - 7  x weekly - 2 sets - 10 reps - Bilateral Bent Leg Lift  - 1 x daily - 7 x weekly - 2 sets - 10 reps - Supine Straight Leg Raises  - 1 x daily - 7 x weekly - 2 sets - 10 reps - Supine Bridge  - 1 x daily - 7 x weekly - 2 sets - 10 reps - Sidelying Hip Abduction  - 1 x daily - 7 x weekly - 2 sets - 10 reps - Seated Heel Toe Raises  - 1 x daily - 7 x weekly - 2 sets - 10 reps - Seated Heel Raise  - 1 x daily - 7 x weekly - 2 sets - 10 reps  GOALS: Goals reviewed with patient? Yes  SHORT TERM GOALS: Target date: 10/09/2023    Patient will be 50% compliant with Hep to self manage her symptoms.  Baseline: TBD Goal status: INITIAL     LONG TERM GOALS: Target date: 11/06/2023    Patient will demo >700' of ambulation in 6 minutes with RW to improve walking endurance.  Baseline: 530' RW (09/11/23) Goal status: INITIAL  2.  Patient will be able to perform SLS of >5 sec on R and L LE to improve static balance.  Baseline: 1 sec on L LE; 2 sec on R LE  (09/11/23) Goal status: INITIAL  3.  Pt will perform 5x sit to stand without UE support in <10 sec to improve overall functional strength.  Baseline: 13 sec without UE support (09/11/23) Goal status: INITIAL  4.  Pt will demo 100% compliance with HEP to self manage her symptoms . Baseline: TBD Goal status: INITIAL   ASSESSMENT:  CLINICAL IMPRESSION: Today's session focused on establishing supine bed exercises for HEP. Pt also demonstrated decreased overall Ankle ROM in left ankle compared to Right. .   OBJECTIVE IMPAIRMENTS: Abnormal gait, decreased activity tolerance, decreased balance, decreased endurance, decreased mobility, difficulty walking, decreased strength, impaired flexibility, impaired tone, impaired UE functional use, postural dysfunction, and pain.   ACTIVITY LIMITATIONS: carrying, lifting, bending, standing, squatting, stairs, transfers, and bathing  PARTICIPATION LIMITATIONS: meal prep, cleaning, laundry, driving, shopping, community activity, and yard work  PERSONAL FACTORS: Age and Time since onset of injury/illness/exacerbation are also affecting patient's functional outcome.   REHAB POTENTIAL: Good  CLINICAL DECISION MAKING: Stable/uncomplicated  EVALUATION COMPLEXITY: Low  PLAN:  PT FREQUENCY: 2x/week  PT DURATION: 6 weeks  PLANNED INTERVENTIONS: 97164- PT Re-evaluation, 97110-Therapeutic exercises, 97530- Therapeutic activity, 97112- Neuromuscular re-education, 97535- Self Care, [id number]- Manual therapy, [phone]- Gait training, Patient/Family education, Balance training, Stair training, Joint mobilization, DME instructions, Cryotherapy, and Moist heat  PLAN FOR NEXT SESSION:  Aquatic therapy discussed as pt was previously receiving it. Pt does't want aquatic therapy during this episode. Issue HEP Check on orders for shower chair.- didn't find orders, sent message to Dr. Delbert on 1/10   Raj LOISE Blanch, PT 09/14/2023, 9:34 AM

## 2023-09-18 ENCOUNTER — Ambulatory Visit: Payer: 59 | Admitting: Speech Pathology

## 2023-09-18 DIAGNOSIS — R1310 Dysphagia, unspecified: Secondary | ICD-10-CM | POA: Diagnosis not present

## 2023-09-18 DIAGNOSIS — R2689 Other abnormalities of gait and mobility: Secondary | ICD-10-CM | POA: Diagnosis not present

## 2023-09-18 DIAGNOSIS — Z9181 History of falling: Secondary | ICD-10-CM | POA: Diagnosis not present

## 2023-09-18 DIAGNOSIS — M6281 Muscle weakness (generalized): Secondary | ICD-10-CM | POA: Diagnosis not present

## 2023-09-18 DIAGNOSIS — R29898 Other symptoms and signs involving the musculoskeletal system: Secondary | ICD-10-CM | POA: Diagnosis not present

## 2023-09-18 DIAGNOSIS — R2681 Unsteadiness on feet: Secondary | ICD-10-CM | POA: Diagnosis not present

## 2023-09-18 NOTE — Patient Instructions (Signed)
 SWALLOWING EXERCISES Effortful Swallows - Squeeze hard with the muscles in your neck while you swallow your  Saliva, water , or puree - water  or puree 1 swallow, 1-2 saliva swallows -complete at least 30 repetitions 2x day   Mendelsohn Maneuver -  swallow as tight as you  for 5 seconds - Start to swallow, and keep your Adam's apple up by squeezing tight with the muscles of the throat - Hold the squeeze for 5-7 seconds and then relax - Repeat 15 times, 2x times a day         3. CTAR - Chin Tuck Against Resistance              - Place towel, ball or pool noodle under your chin             - Hold for 60 seconds 2-3x  a day             - Pulse up and down 20x 2-3x a day

## 2023-09-18 NOTE — Therapy (Signed)
 OUTPATIENT SPEECH LANGUAGE PATHOLOGY TREATMENT NOTE   Patient Name: Cynthia Underwood MRN: [id number] DOB:August 29, 1971, 53 y.o., female Today's Date: 09/18/2023  PCP: Delbert Clam, MD REFERRING PROVIDER: Delbert Clam, MD  END OF SESSION:  End of Session - 09/18/23 0933     Visit Number 4    Number of Visits 17    Date for SLP Re-Evaluation 10/15/23    SLP Start Time 0933    SLP Stop Time  1003    SLP Time Calculation (min) 30 min    Activity Tolerance Patient tolerated treatment well             Past Medical History:  Diagnosis Date   Bifascicular block 10/02/2014   Cataract    Dyspnea    Excess ear wax    Multinodular goiter    Pneumonia    Presence of permanent cardiac pacemaker    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   Sickle cell trait (HCC)    Watery eyes    Left   Weakness of both legs    Past Surgical History:  Procedure Laterality Date   BREAST BIOPSY Right    EYE SURGERY Left 2017   related to blockage in my nose   EYE SURGERY Right 05/2019   INSERT / REPLACE / REMOVE PACEMAKER  02/15/2017   IR GASTROSTOMY TUBE MOD SED  01/01/2023   PACEMAKER IMPLANT N/A 02/15/2017   Procedure: Pacemaker Implant;  Surgeon: Fernande Elspeth BROCKS, MD;  Location: [facility] INVASIVE CV LAB;  Service: Cardiovascular;  Laterality: N/A;   RIGHT/LEFT HEART CATH AND CORONARY ANGIOGRAPHY N/A 11/16/2022   Procedure: RIGHT/LEFT HEART CATH AND CORONARY ANGIOGRAPHY;  Surgeon: Rolan Ezra RAMAN, MD;  Location: [facility] INVASIVE CV LAB;  Service: Cardiovascular;  Laterality: N/A;   THYROIDECTOMY N/A 04/21/2021   Procedure: TOTAL THYROIDECTOMY;  Surgeon: Rubin Calamity, MD;  Location: [facility] OR;  Service: General;  Laterality: N/A;   Patient Active Problem List   Diagnosis Date Noted   Aspiration pneumonia (HCC) 05/21/2023   S/P percutaneous endoscopic gastrostomy (PEG) tube placement (HCC) 03/13/2023   Diarrhea 01/23/2023   SIRS (systemic inflammatory  response syndrome) (HCC) 01/23/2023   Chronic respiratory failure with hypoxia (HCC) 01/23/2023   Dysphagia 01/23/2023   Chronic systolic CHF (congestive heart failure) (HCC) 01/23/2023   Acute on chronic respiratory failure with hypoxia (HCC) 12/07/2022   Tracheostomy status (HCC) 12/07/2022   Generalized anxiety disorder 12/06/2022   Acute systolic heart failure (HCC) 11/26/2022   Drug-induced torsades de pointes (HCC) 11/17/2022   Acute systolic CHF (congestive heart failure) (HCC) 11/16/2022   Acute hypoxemic respiratory failure (HCC) 11/15/2022   Acute on chronic combined systolic and diastolic CHF (congestive heart failure) (HCC) 11/15/2022   Protein-calorie malnutrition, severe 11/15/2022   Cardiac arrest (HCC) 11/14/2022   Healthcare-associated pneumonia 11/11/2022   Hypothyroidism 11/07/2021   Thyroid  nodule 04/21/2021   S/P total thyroidectomy 04/21/2021   Sinus node dysfunction (HCC) 03/10/2020   Weakness of both legs    Watery eyes    Sickle cell trait (HCC)    Presence of permanent cardiac pacemaker    Excess ear wax    Depression    Anemia    Cardiac pacemaker in situ 03/01/2017   Mobitz type 2 second degree heart block 02/15/2017   Symptomatic advanced heart block 02/12/2017   SOB (shortness of breath) 10/02/2014   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   Bifascicular block 10/02/2014  Infection due to trichomonas vaginalis 09/15/2014   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 Reports effortful  swallows have been going well but still having trouble with CTAR with Masako exercises   PERTINENT HISTORY: myotonic dystophy, hypothyroidism, anxiety. PEG dependence.   PAIN:  Are you having pain?  8/10 G-tube site, improves with pain meds, eating/drinking worsen pain   FALLS: Has patient fallen in last 6 months?  No  LIVING ENVIRONMENT: Lives with: lives with their family Lives in: House/apartment  PLOF:  Level of assistance: Independent with ADLs, Independent with IADLs Employment: On disability  PATIENT GOALS: Ability to tolerate p.o.  OBJECTIVE:  Note: Objective measures were completed at Evaluation unless otherwise noted. OBJECTIVE:   INSTRUMENTAL SWALLOW STUDY FINDINGS (MBSS) 06/18/2023 Clinical Impression Pt continues to exhibit signifcant pharyngoesophageal dysphagia marked by reduced tongue base retraction, decreased laryngeal elevation, no epiglottic deflection and reduced anterior hyoid excursion. Majority of boluses remained in valleculae and pyriform sinuses with reduced UES opening. Thin liquid with teaspoon penetrated near cord level, cups sip thin penetrated after the swallow (PAS 3), nectar thick penetrated from residue and puree while performing a chin tuck spilled into airway (PAS 3). Pt did throat clear several times throughout study, however overall sensation was reduced. She demonstrated significant difficulty initiating a second swallow stating that her mouth was dry in attempts to clear residue. A left and right head turn and dry swallows were not effective. Esophageal scan revealed stasis in distal esophagus. Recommend pt continue NPO status and use PEG for nutrition. She may have sips of thin water  (only) after oral care, however pt states that she does not like water . She also states that she gets hungry despite her tube feedings. Pt and cousin report she has not been having home health ST and SLP recommends home health ST to work on pharyngeal  strength.  COGNITION: Overall cognitive status: Within functional limits for tasks assessed  SUBJECTIVE DYSPHAGIA REPORTS:  Date of onset: March 2024 Reported symptoms: coughing with both solids and liquids, globus sensation, xerostomia, shortness of breath with PO, and weak voice  Current diet:  PEG dependent, thin liquid (water ) PO  Co-morbid voice changes: Yes  Weight Loss: 20 lbs   FACTORS WHICH MAY INCREASE RISK OF ADVERSE EVENT IN PRESENCE OF ASPIRATION:  General health: frail or deconditioned  Risk factors: reduced respiratory function, weak cough, GERD or other GI disease, and tube present (PEG)     ORAL MOTOR EXAMINATION: Overall status: Impaired: overall reduced strength evidenced   CLINICAL SWALLOW ASSESSMENT:   Dentition: dentures (upper), some natural dentition on bottom -- eats without teeth  Vocal quality at baseline: hoarse, low vocal intensity, and vocal fatigue Patient directly observed with POs: Yes: thin liquids  Feeding: able to feed self Liquids provided by: teaspoon and cup Yale Swallow Protocol: Pass Oral phase signs and symptoms: anterior loss/spillage Pharyngeal phase signs and symptoms: multiple swallows, wet vocal quality, delayed throat clear, and delayed cough  PATIENT REPORTED OUTCOME MEASURES (PROM): deferred   TODAY'S TREATMENT:     09/18/23: Pt reports increased oral care to BID. Pt completes teach back and demonstration  of effortful swallow and chin tuck against resistance (CTAR). Reviewed need for increased effort during hard swallow exercise to improve accuracy of exercise execution.    DYSPHAGIA TREATMENT:   Current diet:  PEG dependent Patient directly observed with POs: Yes: dysphagia 1 (puree) and thin liquids  Feeding: able to feed self Liquids provided by: cup, puree via tsp Oral phase signs and symptoms:  na Pharyngeal phase signs and symptoms: immediate cough x2 Therapeutic exercises:  Effortful Swallow: every third using puree  every third trial  CTAR: :60 second isometric hold x1, 3 second isokinetic pulsing x10  Mendelsohn: x5 with verbal cues for tongue lifting to aid in hold at top of swallow Types of cueing: verbal and tactile Amount of cueing: moderate Treatment comments: demonstrates understanding of prescribed interventions with increased independence as session progressed.     PATIENT EDUCATION: Education details: see above Person educated: Patient and Caregiver cousin Education method: Explanation, Demonstration, Verbal cues, and Handouts Education comprehension: verbalized understanding, returned demonstration, and needs further education   ASSESSMENT:  CLINICAL IMPRESSION: Patient is a 53 y.o. F who was seen today for dysphagia treatment session. Pt compliant with HEP. Evidencing mod-I for prescribed exercises during therapy session. Reduced insight into deficits evidenced. Continues to benefit from skilled ST to address dysphagia to improve QoL.    OBJECTIVE IMPAIRMENTS: include dysphagia. These impairments are limiting patient from safety when swallowing. Factors affecting potential to achieve goals and functional outcome are severity of impairments. Patient will benefit from skilled SLP services to address above impairments and improve overall function.  REHAB POTENTIAL: Good   GOALS: Goals reviewed with patient? Yes  SHORT TERM GOALS: Target date: 09/14/2023   Pt will report daily HEP completion over 1 week period  Baseline: Goal status: MET  2.  Pt will accurately demonstrate dysphagia exercises with min-A Baseline:  Goal status: MET  3.  Pt will increase oral care BID over one week period Baseline:  Goal status: MET   LONG TERM GOALS: Target date: 10/12/2023  Pt will be independent with dysphagia HEP Baseline:  Goal status: INITIAL  2.  Pt will complete MBSS  Baseline:  Goal status: INITIAL  3.  Pt will utilize swallow compensations and strategies to support PO  consumption of least restrictive diet based on function demonstrated on repeat instrumental swallow study Baseline:  Goal status: INITIAL   PLAN:  SLP FREQUENCY: 2x/week  SLP DURATION: 8 weeks  PLANNED INTERVENTIONS: 92526 Treatment of swallowing function, Re-evaluation, Aspiration precaution training, Pharyngeal strengthening exercises, Diet toleration management , Trials of upgraded texture/liquids, SLP instruction and feedback, Compensatory strategies, and Patient/family education    Harlene LITTIE Ned, CCC-SLP 09/18/2023, 9:38 AM

## 2023-09-19 ENCOUNTER — Ambulatory Visit: Payer: 59 | Admitting: Licensed Clinical Social Worker

## 2023-09-21 ENCOUNTER — Encounter: Payer: Self-pay | Admitting: Speech Pathology

## 2023-09-21 ENCOUNTER — Ambulatory Visit: Payer: 59

## 2023-09-21 ENCOUNTER — Ambulatory Visit: Payer: 59 | Admitting: Speech Pathology

## 2023-09-21 DIAGNOSIS — Z9181 History of falling: Secondary | ICD-10-CM | POA: Diagnosis not present

## 2023-09-21 DIAGNOSIS — R2689 Other abnormalities of gait and mobility: Secondary | ICD-10-CM | POA: Diagnosis not present

## 2023-09-21 DIAGNOSIS — R2681 Unsteadiness on feet: Secondary | ICD-10-CM | POA: Diagnosis not present

## 2023-09-21 DIAGNOSIS — R29898 Other symptoms and signs involving the musculoskeletal system: Secondary | ICD-10-CM | POA: Diagnosis not present

## 2023-09-21 DIAGNOSIS — R1310 Dysphagia, unspecified: Secondary | ICD-10-CM | POA: Diagnosis not present

## 2023-09-21 DIAGNOSIS — M6281 Muscle weakness (generalized): Secondary | ICD-10-CM | POA: Diagnosis not present

## 2023-09-21 NOTE — Therapy (Signed)
OUTPATIENT SPEECH LANGUAGE PATHOLOGY TREATMENT NOTE   Patient Name: Cynthia Underwood MRN: [id number] DOB:[birth year], 53 y.o., female Today's Date: 09/21/2023  PCP: Hoy Register, MD REFERRING PROVIDER: Hoy Register, MD  END OF SESSION:   End of Session - 09/21/23 1037     Visit Number 5    Number of Visits 17    Date for SLP Re-Evaluation 10/15/23    SLP Start Time 0935    SLP Stop Time  1010    SLP Time Calculation (min) 35 min    Activity Tolerance Patient tolerated treatment well             Past Medical History:  Diagnosis Date   Bifascicular block 10/02/2014   Cataract    Dyspnea    Excess ear wax    Multinodular goiter    Pneumonia    Presence of permanent cardiac pacemaker    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   Sickle cell trait (HCC)    Watery eyes    Left   Weakness of both legs    Past Surgical History:  Procedure Laterality Date   BREAST BIOPSY Right    EYE SURGERY Left 2017   "related to blockage in my nose"   EYE SURGERY Right 05/2019   INSERT / REPLACE / REMOVE PACEMAKER  02/15/2017   IR GASTROSTOMY TUBE MOD SED  01/01/2023   PACEMAKER IMPLANT N/A 02/15/2017   Procedure: Pacemaker Implant;  Surgeon: Duke Salvia, MD;  Location: [facility] INVASIVE CV LAB;  Service: Cardiovascular;  Laterality: N/A;   RIGHT/LEFT HEART CATH AND CORONARY ANGIOGRAPHY N/A 11/16/2022   Procedure: RIGHT/LEFT HEART CATH AND CORONARY ANGIOGRAPHY;  Surgeon: Laurey Morale, MD;  Location: [facility] INVASIVE CV LAB;  Service: Cardiovascular;  Laterality: N/A;   THYROIDECTOMY N/A 04/21/2021   Procedure: TOTAL THYROIDECTOMY;  Surgeon: Axel Filler, MD;  Location: [facility] OR;  Service: General;  Laterality: N/A;   Patient Active Problem List   Diagnosis Date Noted   Aspiration pneumonia (HCC) 05/21/2023   S/P percutaneous endoscopic gastrostomy (PEG) tube placement (HCC) 03/13/2023   Diarrhea 01/23/2023   SIRS (systemic inflammatory  response syndrome) (HCC) 01/23/2023   Chronic respiratory failure with hypoxia (HCC) 01/23/2023   Dysphagia 01/23/2023   Chronic systolic CHF (congestive heart failure) (HCC) 01/23/2023   Acute on chronic respiratory failure with hypoxia (HCC) 12/07/2022   Tracheostomy status (HCC) 12/07/2022   Generalized anxiety disorder 12/06/2022   Acute systolic heart failure (HCC) 11/26/2022   Drug-induced torsades de pointes (HCC) 11/17/2022   Acute systolic CHF (congestive heart failure) (HCC) 11/16/2022   Acute hypoxemic respiratory failure (HCC) 11/15/2022   Acute on chronic combined systolic and diastolic CHF (congestive heart failure) (HCC) 11/15/2022   Protein-calorie malnutrition, severe 11/15/2022   Cardiac arrest (HCC) 11/14/2022   Healthcare-associated pneumonia 11/11/2022   Hypothyroidism 11/07/2021   Thyroid nodule 04/21/2021   S/P total thyroidectomy 04/21/2021   Sinus node dysfunction (HCC) 03/10/2020   Weakness of both legs    Watery eyes    Sickle cell trait (HCC)    Presence of permanent cardiac pacemaker    Excess ear wax    Depression    Anemia    Cardiac pacemaker in situ 03/01/2017   Mobitz type 2 second degree heart block 02/15/2017   Symptomatic advanced heart block 02/12/2017   SOB (shortness of breath) 10/02/2014   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   Bifascicular block 10/02/2014  Infection due to trichomonas vaginalis 09/15/2014   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  PERTINENT  HISTORY: myotonic dystophy, hypothyroidism, anxiety. PEG dependence.   PAIN:  Are you having pain? No  FALLS: Has patient fallen in last 6 months?  No  LIVING ENVIRONMENT: Lives with: lives with their family Lives in: House/apartment  PLOF:  Level of assistance: Independent with ADLs, Independent with IADLs Employment: On disability  PATIENT GOALS: Ability to tolerate p.o.  OBJECTIVE:  Note: Objective measures were completed at Evaluation unless otherwise noted. OBJECTIVE:   INSTRUMENTAL SWALLOW STUDY FINDINGS (MBSS) 06/18/2023 Clinical Impression Pt continues to exhibit signifcant pharyngoesophageal dysphagia marked by reduced tongue base retraction, decreased laryngeal elevation, no epiglottic deflection and reduced anterior hyoid excursion. Majority of boluses remained in valleculae and pyriform sinuses with reduced UES opening. Thin liquid with teaspoon penetrated near cord level, cups sip thin penetrated after the swallow (PAS 3), nectar thick penetrated from residue and puree while performing a chin tuck spilled into airway (PAS 3). Pt did throat clear several times throughout study, however overall sensation was reduced. She demonstrated significant difficulty initiating a second swallow stating that her mouth was dry in attempts to clear residue. A left and right head turn and dry swallows were not effective. Esophageal scan revealed stasis in distal esophagus. Recommend pt continue NPO status and use PEG for nutrition. She may have sips of thin water (only) after oral care, however pt states that she does not like water. She also states that she gets hungry despite her tube feedings. Pt and cousin report she has not been having home health ST and SLP recommends home health ST to work on pharyngeal strength.  COGNITION: Overall cognitive status: Within functional limits for tasks assessed  SUBJECTIVE DYSPHAGIA REPORTS:  Date of onset: March 2024 Reported symptoms: coughing with  both solids and liquids, globus sensation, xerostomia, shortness of breath with PO, and weak voice  Current diet:  PEG dependent, thin liquid (water) PO  Co-morbid voice changes: Yes  Weight Loss: 20 lbs   FACTORS WHICH MAY INCREASE RISK OF ADVERSE EVENT IN PRESENCE OF ASPIRATION:  General health: frail or deconditioned  Risk factors: reduced respiratory function, weak cough, GERD or other GI disease, and tube present (PEG)     ORAL MOTOR EXAMINATION: Overall status: Impaired: overall reduced strength evidenced   CLINICAL SWALLOW ASSESSMENT:   Dentition: dentures (upper), some natural dentition on bottom -- eats without teeth  Vocal quality at baseline: hoarse, low vocal intensity, and vocal fatigue Patient directly observed with POs: Yes: thin liquids  Feeding: able to feed self Liquids provided by: teaspoon and cup Yale Swallow Protocol: Pass Oral phase signs and symptoms: anterior loss/spillage Pharyngeal phase signs and symptoms: multiple swallows, wet vocal quality, delayed throat clear, and delayed cough  PATIENT REPORTED OUTCOME MEASURES (PROM): deferred   TODAY'S TREATMENT:     09/21/23: Pt reports completing ~10 effortful swallows per meal at home. Due to reported ease with the exercise ST recommended increasing from 10 to 30 swallows each meal, and daily practice with the Mendelson technique. ST introduced Shaker technique targeting UES opening  and improved pharyngeal clearing of boluses. Pt required max-A and physical support. ST plans to continue practice with new exercise in sessions and reviewed need for increased practice with exercises at home.     DYSPHAGIA TREATMENT:   Current diet:  PEG dependent Patient directly observed with POs: Yes: dysphagia 1 (puree) and thin liquids  Feeding: able to feed self Liquids provided by: cup, puree via tsp Oral phase signs and symptoms:  na Pharyngeal phase signs and symptoms: na Therapeutic exercises:  Effortful Swallow: x10  puree, x10 thin liquid Mendelsohn: x5 patient successfully delayed laryngeal elevation with min-A Shaker: x5 3 second holds with A from SLP Types of cueing: verbal and tactile Amount of cueing: moderate Treatment comments: demonstrates understanding of prescribed interventions with increased independence as session progressed.     PATIENT EDUCATION: Education details: see above Person educated: Patient and Caregiver cousin Education method: Explanation, Demonstration, Verbal cues, and Handouts Education comprehension: verbalized understanding, returned demonstration, and needs further education   ASSESSMENT:  CLINICAL IMPRESSION: Patient is a 53 y.o. F who was seen today for dysphagia treatment session. Pt compliant with HEP. Evidencing mod-I for prescribed exercises during therapy session. Reduced insight into deficits evidenced. Continues to benefit from skilled ST to address dysphagia to improve QoL.    OBJECTIVE IMPAIRMENTS: include dysphagia. These impairments are limiting patient from safety when swallowing. Factors affecting potential to achieve goals and functional outcome are severity of impairments. Patient will benefit from skilled SLP services to address above impairments and improve overall function.  REHAB POTENTIAL: Good   GOALS: Goals reviewed with patient? Yes  SHORT TERM GOALS: Target date: 09/14/2023   Pt will report daily HEP completion over 1 week period  Baseline: Goal status: MET  2.  Pt will accurately demonstrate dysphagia exercises with min-A Baseline:  Goal status: MET  3.  Pt will increase oral care BID over one week period Baseline:  Goal status: MET   LONG TERM GOALS: Target date: 10/12/2023  Pt will be independent with dysphagia HEP Baseline:  Goal status: IN PROGRESS  2.  Pt will complete MBSS  Baseline:  Goal status: INITIAL  3.  Pt will utilize swallow compensations and strategies to support PO consumption of least restrictive diet  based on function demonstrated on repeat instrumental swallow study Baseline:  Goal status: IN PROGRESS    PLAN:  SLP FREQUENCY: 2x/week  SLP DURATION: 8 weeks  PLANNED INTERVENTIONS: 92526 Treatment of swallowing function, Re-evaluation, Aspiration precaution training, Pharyngeal strengthening exercises, Diet toleration management , Trials of upgraded texture/liquids, SLP instruction and feedback, Compensatory strategies, and Patient/family education    [place], Student-SLP 09/21/2023, 9:37 AM

## 2023-09-24 NOTE — Addendum Note (Signed)
Addended by: Geralyn Flash D on: 09/24/2023 04:45 PM   Modules accepted: Orders

## 2023-09-24 NOTE — Progress Notes (Signed)
Remote pacemaker transmission.   

## 2023-09-25 ENCOUNTER — Ambulatory Visit: Payer: 59 | Admitting: Speech Pathology

## 2023-09-25 ENCOUNTER — Ambulatory Visit: Payer: 59 | Admitting: Physical Therapy

## 2023-09-25 DIAGNOSIS — M6281 Muscle weakness (generalized): Secondary | ICD-10-CM

## 2023-09-25 DIAGNOSIS — R1310 Dysphagia, unspecified: Secondary | ICD-10-CM

## 2023-09-25 DIAGNOSIS — Z9181 History of falling: Secondary | ICD-10-CM | POA: Diagnosis not present

## 2023-09-25 DIAGNOSIS — R2689 Other abnormalities of gait and mobility: Secondary | ICD-10-CM

## 2023-09-25 DIAGNOSIS — R2681 Unsteadiness on feet: Secondary | ICD-10-CM

## 2023-09-25 DIAGNOSIS — R29898 Other symptoms and signs involving the musculoskeletal system: Secondary | ICD-10-CM

## 2023-09-25 NOTE — Therapy (Signed)
OUTPATIENT SPEECH LANGUAGE PATHOLOGY TREATMENT NOTE   Patient Name: Cynthia Underwood MRN: [id number] DOB:[birth year], 53 y.o., female Today's Date: 09/25/2023  PCP: Hoy Register, MD REFERRING PROVIDER: Hoy Register, MD  END OF SESSION:  End of Session - 09/25/23 1306     Visit Number 6    Number of Visits 17    Date for SLP Re-Evaluation 10/15/23    SLP Start Time 1230    SLP Stop Time  0108    SLP Time Calculation (min) 758 min    Activity Tolerance Patient tolerated treatment well             Past Medical History:  Diagnosis Date   Bifascicular block 10/02/2014   Cataract    Dyspnea    Excess ear wax    Multinodular goiter    Pneumonia    Presence of permanent cardiac pacemaker    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   Sickle cell trait (HCC)    Watery eyes    Left   Weakness of both legs    Past Surgical History:  Procedure Laterality Date   BREAST BIOPSY Right    EYE SURGERY Left 2017   "related to blockage in my nose"   EYE SURGERY Right 05/2019   INSERT / REPLACE / REMOVE PACEMAKER  02/15/2017   IR GASTROSTOMY TUBE MOD SED  01/01/2023   PACEMAKER IMPLANT N/A 02/15/2017   Procedure: Pacemaker Implant;  Surgeon: Duke Salvia, MD;  Location: [facility] INVASIVE CV LAB;  Service: Cardiovascular;  Laterality: N/A;   RIGHT/LEFT HEART CATH AND CORONARY ANGIOGRAPHY N/A 11/16/2022   Procedure: RIGHT/LEFT HEART CATH AND CORONARY ANGIOGRAPHY;  Surgeon: Laurey Morale, MD;  Location: [facility] INVASIVE CV LAB;  Service: Cardiovascular;  Laterality: N/A;   THYROIDECTOMY N/A 04/21/2021   Procedure: TOTAL THYROIDECTOMY;  Surgeon: Axel Filler, MD;  Location: [facility] OR;  Service: General;  Laterality: N/A;   Patient Active Problem List   Diagnosis Date Noted   Aspiration pneumonia (HCC) 05/21/2023   S/P percutaneous endoscopic gastrostomy (PEG) tube placement (HCC) 03/13/2023   Diarrhea 01/23/2023   SIRS (systemic inflammatory  response syndrome) (HCC) 01/23/2023   Chronic respiratory failure with hypoxia (HCC) 01/23/2023   Dysphagia 01/23/2023   Chronic systolic CHF (congestive heart failure) (HCC) 01/23/2023   Acute on chronic respiratory failure with hypoxia (HCC) 12/07/2022   Tracheostomy status (HCC) 12/07/2022   Generalized anxiety disorder 12/06/2022   Acute systolic heart failure (HCC) 11/26/2022   Drug-induced torsades de pointes (HCC) 11/17/2022   Acute systolic CHF (congestive heart failure) (HCC) 11/16/2022   Acute hypoxemic respiratory failure (HCC) 11/15/2022   Acute on chronic combined systolic and diastolic CHF (congestive heart failure) (HCC) 11/15/2022   Protein-calorie malnutrition, severe 11/15/2022   Cardiac arrest (HCC) 11/14/2022   Healthcare-associated pneumonia 11/11/2022   Hypothyroidism 11/07/2021   Thyroid nodule 04/21/2021   S/P total thyroidectomy 04/21/2021   Sinus node dysfunction (HCC) 03/10/2020   Weakness of both legs    Watery eyes    Sickle cell trait (HCC)    Presence of permanent cardiac pacemaker    Excess ear wax    Depression    Anemia    Cardiac pacemaker in situ 03/01/2017   Mobitz type 2 second degree heart block 02/15/2017   Symptomatic advanced heart block 02/12/2017   SOB (shortness of breath) 10/02/2014   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   Bifascicular block 10/02/2014  Infection due to trichomonas vaginalis 09/15/2014   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    ONSET DATE: 11/2022   REFERRING DIAG:  J69.0 (ICD-10-CM) - Aspiration pneumonia of right middle lobe, unspecified aspiration pneumonia type (HCC)  Z93.1 (ICD-10-CM) - S/P percutaneous endoscopic gastrostomy (PEG) tube placement (HCC)    THERAPY DIAG:  Dysphagia, unspecified type  Muscle weakness (generalized)  Rationale for Evaluation and Treatment: Rehabilitation  SUBJECTIVE:   SUBJECTIVE STATEMENT: Pt reports ease with effortful  swallows at home, practicing effortful swallows every other day, ST educated on need for consistent daily practice   PERTINENT HISTORY: myotonic dystophy, hypothyroidism, anxiety. PEG dependence.   PAIN:  Are you having pain? No  FALLS: Has patient fallen in last 6 months?  No  LIVING ENVIRONMENT: Lives with: lives with their family Lives in: House/apartment  PLOF:  Level of assistance: Independent with ADLs, Independent with IADLs Employment: On disability  PATIENT GOALS: Ability to tolerate p.o.  OBJECTIVE:  Note: Objective measures were completed at Evaluation unless otherwise noted. OBJECTIVE:   INSTRUMENTAL SWALLOW STUDY FINDINGS (MBSS) 06/18/2023 Clinical Impression Pt continues to exhibit signifcant pharyngoesophageal dysphagia marked by reduced tongue base retraction, decreased laryngeal elevation, no epiglottic deflection and reduced anterior hyoid excursion. Majority of boluses remained in valleculae and pyriform sinuses with reduced UES opening. Thin liquid with teaspoon penetrated near cord level, cups sip thin penetrated after the swallow (PAS 3), nectar thick penetrated from residue and puree while performing a chin tuck spilled into airway (PAS 3). Pt did throat clear several times throughout study, however overall sensation was reduced. She demonstrated significant difficulty initiating a second swallow stating that her mouth was dry in attempts to clear residue. A left and right head turn and dry swallows were not effective. Esophageal scan revealed stasis in distal esophagus. Recommend pt continue NPO status and use PEG for nutrition. She may have sips of thin water (only) after oral care, however pt states that she does not like water. She also states that she gets hungry despite her tube feedings. Pt and cousin report she has not been having home health ST and SLP recommends home health ST to work on pharyngeal strength.  COGNITION: Overall cognitive status: Within  functional limits for tasks assessed  SUBJECTIVE DYSPHAGIA REPORTS:  Date of onset: March 2024 Reported symptoms: coughing with both solids and liquids, globus sensation, xerostomia, shortness of breath with PO, and weak voice  Current diet:  PEG dependent, thin liquid (water) PO  Co-morbid voice changes: Yes  Weight Loss: 20 lbs   FACTORS WHICH MAY INCREASE RISK OF ADVERSE EVENT IN PRESENCE OF ASPIRATION:  General health: frail or deconditioned  Risk factors: reduced respiratory function, weak cough, GERD or other GI disease, and tube present (PEG)     ORAL MOTOR EXAMINATION: Overall status: Impaired: overall reduced strength evidenced   CLINICAL SWALLOW ASSESSMENT:   Dentition: dentures (upper), some natural dentition on bottom -- eats without teeth  Vocal quality at baseline: hoarse, low vocal intensity, and vocal fatigue Patient directly observed with POs: Yes: thin liquids  Feeding: able to feed self Liquids provided by: teaspoon and cup Yale Swallow Protocol: Pass Oral phase signs and symptoms: anterior loss/spillage Pharyngeal phase signs and symptoms: multiple swallows, wet vocal quality, delayed throat clear, and delayed cough  PATIENT REPORTED OUTCOME MEASURES (PROM): deferred   TODAY'S TREATMENT:     09/25/23: Pt reports completing ~30 effortful swallows every other day at home. Due to reported ease with the exercise and recommendations offered at  prior session; ST reminded pt of recommendation of 30 effortful swallows everyday 1-2 times a day, and daily practice with the Mendelson technique. Pt reports to ongoing CTAR completion but currently easy with using rolled up towel. SLP provided alternatives to increase intensity: using fist under chin for increased resistance or towel hooked under chin while pulling up. ST plans to continue practice with new exercise in sessions and reviewed need for increased practice with exercises at home.     DYSPHAGIA TREATMENT:   Current  diet:  PEG dependent Patient directly observed with POs: Yes: dysphagia 1 (puree) and thin liquids  Feeding: able to feed self Liquids provided by: cup, puree via tsp Oral phase signs and symptoms:  na Pharyngeal phase signs and symptoms: na Therapeutic exercises:  Effortful Swallow: x5 puree, x15 thin liquid x15 without food/drink. Mendelsohn: x5 patient successfully delayed laryngeal elevation with min-A. Types of cueing: verbal and tactile Amount of cueing: moderate Treatment comments: demonstrates understanding of prescribed interventions with increased independence as session progressed.     PATIENT EDUCATION: Education details: see above Person educated: Patient and Caregiver cousin Education method: Explanation, Demonstration, Verbal cues, and Handouts Education comprehension: verbalized understanding, returned demonstration, and needs further education   ASSESSMENT:  CLINICAL IMPRESSION: Patient is a 53 y.o. F who was seen today for dysphagia treatment session. Pt mostly compliant with HEP. Evidencing mod-I for prescribed exercises during therapy session. Reduced insight into deficits evidenced. Continues to benefit from skilled ST to address dysphagia to improve QoL.    OBJECTIVE IMPAIRMENTS: include dysphagia. These impairments are limiting patient from safety when swallowing. Factors affecting potential to achieve goals and functional outcome are severity of impairments. Patient will benefit from skilled SLP services to address above impairments and improve overall function.  REHAB POTENTIAL: Good   GOALS: Goals reviewed with patient? Yes  SHORT TERM GOALS: Target date: 09/14/2023   Pt will report daily HEP completion over 1 week period  Baseline: Goal status: MET  2.  Pt will accurately demonstrate dysphagia exercises with min-A Baseline:  Goal status: MET  3.  Pt will increase oral care BID over one week period Baseline:  Goal status: MET   LONG TERM  GOALS: Target date: 10/12/2023  Pt will be independent with dysphagia HEP Baseline:  Goal status: IN PROGRESS  2.  Pt will complete MBSS  Baseline:  Goal status: INITIAL  3.  Pt will utilize swallow compensations and strategies to support PO consumption of least restrictive diet based on function demonstrated on repeat instrumental swallow study Baseline:  Goal status: IN PROGRESS    PLAN:  SLP FREQUENCY: 2x/week  SLP DURATION: 8 weeks  PLANNED INTERVENTIONS: 92526 Treatment of swallowing function, Re-evaluation, Aspiration precaution training, Pharyngeal strengthening exercises, Diet toleration management , Trials of upgraded texture/liquids, SLP instruction and feedback, Compensatory strategies, and Patient/family education    [place], Student-SLP 09/25/2023, 1:07 PM

## 2023-09-25 NOTE — Patient Instructions (Signed)
30 hard swallows 1 or 2 times every day  15 of the adams apple holds every day  10 times for 30 seconds with fist to chin or using towel every day

## 2023-09-25 NOTE — Therapy (Signed)
OUTPATIENT PHYSICAL THERAPY NEURO TREATMENT   Patient Name: Cynthia Underwood MRN: [id number] DOB:1970/11/26, 52 y.o., female Today's Date: 09/25/2023   PCP:[name] REFERRING PROVIDER: Hoy Register, MD  END OF SESSION:  PT End of Session - 09/25/23 1144     Visit Number 3    Number of Visits 13    Date for PT Re-Evaluation 11/06/23    Authorization Type UHC Medicare/Medicaid    PT Start Time 1143    PT Stop Time 1228    PT Time Calculation (min) 45 min    Activity Tolerance Patient tolerated treatment well    Behavior During Therapy [facility] for tasks assessed/performed              Past Medical History:  Diagnosis Date   Bifascicular block 10/02/2014   Cataract    Dyspnea    Excess ear wax    Multinodular goiter    Pneumonia    Presence of permanent cardiac pacemaker    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   Sickle cell trait (HCC)    Watery eyes    Left   Weakness of both legs    Past Surgical History:  Procedure Laterality Date   BREAST BIOPSY Right    EYE SURGERY Left 2017   "related to blockage in my nose"   EYE SURGERY Right 05/2019   INSERT / REPLACE / REMOVE PACEMAKER  02/15/2017   IR GASTROSTOMY TUBE MOD SED  01/01/2023   PACEMAKER IMPLANT N/A 02/15/2017   Procedure: Pacemaker Implant;  Surgeon: Duke Salvia, MD;  Location: [facility] INVASIVE CV LAB;  Service: Cardiovascular;  Laterality: N/A;   RIGHT/LEFT HEART CATH AND CORONARY ANGIOGRAPHY N/A 11/16/2022   Procedure: RIGHT/LEFT HEART CATH AND CORONARY ANGIOGRAPHY;  Surgeon: Laurey Morale, MD;  Location: [facility] INVASIVE CV LAB;  Service: Cardiovascular;  Laterality: N/A;   THYROIDECTOMY N/A 04/21/2021   Procedure: TOTAL THYROIDECTOMY;  Surgeon: Axel Filler, MD;  Location: [facility] OR;  Service: General;  Laterality: N/A;   Patient Active Problem List   Diagnosis Date Noted   Aspiration pneumonia (HCC) 05/21/2023   S/P percutaneous endoscopic gastrostomy (PEG)  tube placement (HCC) 03/13/2023   Diarrhea 01/23/2023   SIRS (systemic inflammatory response syndrome) (HCC) 01/23/2023   Chronic respiratory failure with hypoxia (HCC) 01/23/2023   Dysphagia 01/23/2023   Chronic systolic CHF (congestive heart failure) (HCC) 01/23/2023   Acute on chronic respiratory failure with hypoxia (HCC) 12/07/2022   Tracheostomy status (HCC) 12/07/2022   Generalized anxiety disorder 12/06/2022   Acute systolic heart failure (HCC) 11/26/2022   Drug-induced torsades de pointes (HCC) 11/17/2022   Acute systolic CHF (congestive heart failure) (HCC) 11/16/2022   Acute hypoxemic respiratory failure (HCC) 11/15/2022   Acute on chronic combined systolic and diastolic CHF (congestive heart failure) (HCC) 11/15/2022   Protein-calorie malnutrition, severe 11/15/2022   Cardiac arrest (HCC) 11/14/2022   Healthcare-associated pneumonia 11/11/2022   Hypothyroidism 11/07/2021   Thyroid nodule 04/21/2021   S/P total thyroidectomy 04/21/2021   Sinus node dysfunction (HCC) 03/10/2020   Weakness of both legs    Watery eyes    Sickle cell trait (HCC)    Presence of permanent cardiac pacemaker    Excess ear wax    Depression    Anemia    Cardiac pacemaker in situ 03/01/2017   Mobitz type 2 second degree heart block 02/15/2017   Symptomatic advanced heart block 02/12/2017   SOB (shortness of breath) 10/02/2014   RBBB (right  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) 10/02/2014   Bifascicular block 10/02/2014   Infection due to trichomonas vaginalis 09/15/2014   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    ONSET DATE: 08/22/2023  REFERRING DIAG: G71.11 (ICD-10-CM) - Myotonic dystrophy, type 1 (HCC)  THERAPY DIAG:  Muscle weakness (generalized)  Unsteadiness on feet  Other abnormalities of gait and mobility  Other symptoms and signs involving the musculoskeletal system  History of falling  Rationale for Evaluation and Treatment: Rehabilitation  SUBJECTIVE:                                                                                                                                                                                              SUBJECTIVE STATEMENT: Pt denies any falls since last visit, pt reports having some pain her legs but its "fine". Pt was worked very hard last PT session.  Pt accompanied by: self  PERTINENT HISTORY: Starting around the age of 20, she started having lock jaw and stiffness of the hands.  Over the years, she developed worsening stiffness of her fingers and hands.  she ultimately was genetically tested at the age of 78 which confirmed the diagnosis of myotonic dystrophy type 1. She has a strong family history of DM1 including maternal aunt, cousins x 2, and younger sister.  She does not have any children and has no future plans for pregnancy.   Symptoms were relatively stable until her mid-30s and then started developing fatigue, weakness, daytime sleepiness, and shortness of breath with exertion.  She walks independently but was told previously told to use leg braces.  Over the past few years, she noticed intermittent difficulty swallowing liquids. She underwent barium swallow which showed signs of aspiration and she was recommended to use a straw and use chin tuck position.     She was seeing several neurologists over the years at [address] in [place].  She is not working and has been on disability since 2010.  PAIN:  Are you having pain? Yes: NPRS scale: 8/10 Pain location: GI tube insertion Pain description: achy Aggravating factors: none Relieving factors: none  PRECAUTIONS: Fall, ICD/Pacemaker, and Other: G-Tube  RED FLAGS: None   WEIGHT BEARING RESTRICTIONS: No  FALLS: Has patient fallen in last 6 months? No  LIVING ENVIRONMENT: Lives with:  cousin Lives in: House/apartment Stairs: Yes: Internal: 12 steps; on right going up Has following equipment at home: Walker - 4 wheeled  PLOF:  Needs assistance with ADLs and Needs assistance with homemaking  PATIENT GOALS: improve walking.   OBJECTIVE:  Note: Objective measures were completed at Evaluation unless otherwise noted.  COGNITION: Overall cognitive status: Within functional limits for tasks assessed  LOWER EXTREMITY ROM:     Active  Right Eval Left Eval  Hip flexion    Hip extension    Hip abduction    Hip adduction    Hip internal rotation    Hip external rotation    Knee flexion    Knee extension    Ankle dorsiflexion    Ankle plantarflexion    Ankle inversion    Ankle eversion     (Blank rows = not tested)  LOWER EXTREMITY MMT:    MMT Right Eval Left Eval  Hip flexion    Hip extension    Hip abduction    Hip adduction    Hip internal rotation    Hip external rotation    Knee flexion    Knee extension    Ankle dorsiflexion    Ankle plantarflexion    Ankle inversion    Ankle eversion    (Blank rows = not tested)  BED MOBILITY:  Sit to supine Complete Independence Supine to sit Complete Independence Rolling to Right Complete Independence Rolling to Left Complete Independence  TRANSFERS: Assistive device utilized: Environmental consultant - 4 wheeled  Sit to stand: Modified independence Stand to sit: Modified independence Chair to chair: Modified independence  GAIT: Gait pattern: decreased arm swing- Right, decreased arm swing- Left, decreased step length- Right, decreased step length- Left, Right foot flat, Left foot flat, knee flexed in stance- Right, knee flexed in stance- Left, decreased trunk rotation, trunk flexed, poor foot clearance- Right, and poor foot clearance- Left Distance walked: 530' Assistive device utilized: Environmental consultant - 4 wheeled Level of assistance: SBA   FUNCTIONAL TESTS:  5 times sit to stand: 13 sec no UE support 6 minute walk test: SpO2 before walk was 96% 530 feet with RW and one standing break. SpO2 during break wa 98%, pt on 3L of O2.  Ankle AROM: (09/14/23)  Dorsiflexion:  R = 10 deg; L = lacks 8 deg to neutral with increased supination noted in L foot.  Plantarflexion: R = 40 deg; L  = 20 deg                                                                                                                              TREATMENT:  TherEx SciFit multi-peaks level 3 for 8 minutes using BUE/BLEs for neural priming for reciprocal movement, dynamic cardiovascular warmup and increased amplitude of stepping. RPE of 8/10 following activity.  Seated gastroc stretch with gait belt 3 x 30 sec each B Supine modified Thomas stretch 3 x 30 sec each B  Standing gastroc stretch at wall 2 x 30 sec each B Standing hip flexor stretch at bottom of stairs with one LE on 2nd step, 2 x 30 sec each B   Added stretches to HEP, see bolded below   PATIENT EDUCATION: Education details: continue HEP, added to HEP Person educated: Patient Education method:  Explanation, Demonstration, and Handouts Education comprehension: verbalized understanding, returned demonstration, and needs further education  HOME EXERCISE PROGRAM: Access Code: [email or web address] URL: [email or web address] Date: 09/14/2023 Prepared by: Lavone Nian  Exercises - Supine March  - 1 x daily - 7 x weekly - 2 sets - 10 reps - Bilateral Bent Leg Lift  - 1 x daily - 7 x weekly - 2 sets - 10 reps - Supine Straight Leg Raises  - 1 x daily - 7 x weekly - 2 sets - 10 reps - Supine Bridge  - 1 x daily - 7 x weekly - 2 sets - 10 reps - Sidelying Hip Abduction  - 1 x daily - 7 x weekly - 2 sets - 10 reps - Seated Heel Toe Raises  - 1 x daily - 7 x weekly - 2 sets - 10 reps - Seated Heel Raise  - 1 x daily - 7 x weekly - 2 sets - 10 reps - Modified Thomas Stretch  - 1 x daily - 7 x weekly - 1 sets - 3-5 reps - 30 sec hold - Standing Gastroc Stretch at Counter  - 1 x daily - 7 x weekly - 1 sets - 3-5 reps - 30 sec hold - Standing Hip Flexor Stretch  - 1 x daily - 7 x weekly - 1 sets - 3-5 reps - 30 sec  hold   GOALS: Goals reviewed with patient? Yes  SHORT TERM GOALS: Target date: 10/09/2023    Patient will be 50% compliant with Hep to self manage her symptoms.  Baseline: TBD Goal status: INITIAL     LONG TERM GOALS: Target date: 11/06/2023    Patient will demo >700' of ambulation in 6 minutes with RW to improve walking endurance.  Baseline: 530' RW (09/11/23) Goal status: INITIAL  2.  Patient will be able to perform SLS of >5 sec on R and L LE to improve static balance.  Baseline: 1 sec on L LE; 2 sec on R LE (09/11/23) Goal status: INITIAL  3.  Pt will perform 5x sit to stand without UE support in <10 sec to improve overall functional strength.  Baseline: 13 sec without UE support (09/11/23) Goal status: INITIAL  4.  Pt will demo 100% compliance with HEP to self manage her symptoms . Baseline: TBD Goal status: INITIAL   ASSESSMENT:  CLINICAL IMPRESSION: Emphasis of skilled PT session on working on LE stretching to address tightness in hip flexors and gastroc. Trialed various positions for stretching to give patient options and provided handout at end of session. Pt continues to benefit from skilled PT services to work on improving her strength, ROM, and aerobic endurance. Continue POC.   OBJECTIVE IMPAIRMENTS: Abnormal gait, decreased activity tolerance, decreased balance, decreased endurance, decreased mobility, difficulty walking, decreased strength, impaired flexibility, impaired tone, impaired UE functional use, postural dysfunction, and pain.   ACTIVITY LIMITATIONS: carrying, lifting, bending, standing, squatting, stairs, transfers, and bathing  PARTICIPATION LIMITATIONS: meal prep, cleaning, laundry, driving, shopping, community activity, and yard work  PERSONAL FACTORS: Age and Time since onset of injury/illness/exacerbation are also affecting patient's functional outcome.   REHAB POTENTIAL: Good  CLINICAL DECISION MAKING: Stable/uncomplicated  EVALUATION  COMPLEXITY: Low  PLAN:  PT FREQUENCY: 2x/week  PT DURATION: 6 weeks  PLANNED INTERVENTIONS: 97164- PT Re-evaluation, 97110-Therapeutic exercises, 97530- Therapeutic activity, 97112- Neuromuscular re-education, 97535- Self Care, 16109- Manual therapy, [phone]- Gait training, Patient/Family education, Balance training, Stair training, Joint mobilization, DME instructions, Cryotherapy, and Moist heat  PLAN  FOR NEXT SESSION:  Aquatic therapy discussed as pt was previously receiving it. Pt does't want aquatic therapy during this episode. How is HEP? Add/adjust PRN, work on global endurance, functional strengthening, ROM/stretching  Check on orders for shower chair.- didn't find orders, sent message to [name] on 1/10, still no orders 1/21   Peter Congo, PT Peter Congo, PT, DPT, CSRS  09/25/2023, 12:34 PM

## 2023-09-28 ENCOUNTER — Ambulatory Visit: Payer: 59 | Admitting: Speech Pathology

## 2023-09-28 ENCOUNTER — Encounter: Payer: Self-pay | Admitting: Speech Pathology

## 2023-09-28 ENCOUNTER — Ambulatory Visit: Payer: 59

## 2023-09-28 DIAGNOSIS — R2681 Unsteadiness on feet: Secondary | ICD-10-CM

## 2023-09-28 DIAGNOSIS — R29898 Other symptoms and signs involving the musculoskeletal system: Secondary | ICD-10-CM | POA: Diagnosis not present

## 2023-09-28 DIAGNOSIS — R2689 Other abnormalities of gait and mobility: Secondary | ICD-10-CM

## 2023-09-28 DIAGNOSIS — Z9181 History of falling: Secondary | ICD-10-CM | POA: Diagnosis not present

## 2023-09-28 DIAGNOSIS — M6281 Muscle weakness (generalized): Secondary | ICD-10-CM

## 2023-09-28 DIAGNOSIS — R1310 Dysphagia, unspecified: Secondary | ICD-10-CM

## 2023-09-28 NOTE — Therapy (Signed)
OUTPATIENT PHYSICAL THERAPY NEURO TREATMENT   Patient Name: Cynthia Underwood MRN: [id number] DOB:[birth year], 53 y.o., female Today's Date: 09/28/2023   PCP:[name] REFERRING PROVIDER: Hoy Register, MD  END OF SESSION:  PT End of Session - 09/28/23 0848     Visit Number 4    Number of Visits 13    Date for PT Re-Evaluation 11/06/23    Authorization Type [employer] Medicare/Medicaid    PT Start Time [phone]    PT Stop Time 0930    PT Time Calculation (min) 40 min    Equipment Utilized During Treatment Gait belt    Activity Tolerance Patient tolerated treatment well    Behavior During Therapy WFL for tasks assessed/performed              Past Medical History:  Diagnosis Date   Bifascicular block 10/02/2014   Cataract    Dyspnea    Excess ear wax    Multinodular goiter    Pneumonia    Presence of permanent cardiac pacemaker    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   Sickle cell trait (HCC)    Watery eyes    Left   Weakness of both legs    Past Surgical History:  Procedure Laterality Date   BREAST BIOPSY Right    EYE SURGERY Left 2017   "related to blockage in my nose"   EYE SURGERY Right 05/2019   INSERT / REPLACE / REMOVE PACEMAKER  02/15/2017   IR GASTROSTOMY TUBE MOD SED  01/01/2023   PACEMAKER IMPLANT N/A 02/15/2017   Procedure: Pacemaker Implant;  Surgeon: Duke Salvia, MD;  Location: [facility] INVASIVE CV LAB;  Service: Cardiovascular;  Laterality: N/A;   RIGHT/LEFT HEART CATH AND CORONARY ANGIOGRAPHY N/A 11/16/2022   Procedure: RIGHT/LEFT HEART CATH AND CORONARY ANGIOGRAPHY;  Surgeon: Laurey Morale, MD;  Location: [facility] INVASIVE CV LAB;  Service: Cardiovascular;  Laterality: N/A;   THYROIDECTOMY N/A 04/21/2021   Procedure: TOTAL THYROIDECTOMY;  Surgeon: Axel Filler, MD;  Location: [facility] OR;  Service: General;  Laterality: N/A;   Patient Active Problem List   Diagnosis Date Noted   Aspiration pneumonia (HCC)  05/21/2023   S/P percutaneous endoscopic gastrostomy (PEG) tube placement (HCC) 03/13/2023   Diarrhea 01/23/2023   SIRS (systemic inflammatory response syndrome) (HCC) 01/23/2023   Chronic respiratory failure with hypoxia (HCC) 01/23/2023   Dysphagia 01/23/2023   Chronic systolic CHF (congestive heart failure) (HCC) 01/23/2023   Acute on chronic respiratory failure with hypoxia (HCC) 12/07/2022   Tracheostomy status (HCC) 12/07/2022   Generalized anxiety disorder 12/06/2022   Acute systolic heart failure (HCC) 11/26/2022   Drug-induced torsades de pointes (HCC) 11/17/2022   Acute systolic CHF (congestive heart failure) (HCC) 11/16/2022   Acute hypoxemic respiratory failure (HCC) 11/15/2022   Acute on chronic combined systolic and diastolic CHF (congestive heart failure) (HCC) 11/15/2022   Protein-calorie malnutrition, severe 11/15/2022   Cardiac arrest (HCC) 11/14/2022   Healthcare-associated pneumonia 11/11/2022   Hypothyroidism 11/07/2021   Thyroid nodule 04/21/2021   S/P total thyroidectomy 04/21/2021   Sinus node dysfunction (HCC) 03/10/2020   Weakness of both legs    Watery eyes    Sickle cell trait (HCC)    Presence of permanent cardiac pacemaker    Excess ear wax    Depression    Anemia    Cardiac pacemaker in situ 03/01/2017   Mobitz type 2 second degree heart block 02/15/2017   Symptomatic advanced heart block 02/12/2017  SOB (shortness of breath) 10/02/2014   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   Bifascicular block 10/02/2014   Infection due to trichomonas vaginalis 09/15/2014   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 No falls.  Pt accompanied by: self  PERTINENT HISTORY: Starting around the age of 52, she started having lock jaw and stiffness of the hands.  Over the years, she developed worsening stiffness of her fingers and hands.  she ultimately was genetically tested at the age of 72 which confirmed the diagnosis of myotonic dystrophy type 1. She has a strong family history of DM1 including maternal aunt, cousins x 2, and younger sister.  She does not have any children and has no future plans for pregnancy.   Symptoms were relatively stable until her mid-30s and then started developing fatigue, weakness, daytime sleepiness, and shortness of breath with exertion.  She walks independently but was told previously told to use leg braces.  Over the past few years, she noticed intermittent difficulty swallowing liquids. She underwent barium swallow which showed signs of aspiration and she was recommended to use a straw and use chin tuck position.     She was seeing several neurologists over the years at [address] in [place].  She is not working and has been on disability since 2010.  PAIN:  Are you having pain? Yes: NPRS scale: 8/10 Pain location: GI tube insertion Pain description: achy Aggravating factors: none Relieving factors: none  PRECAUTIONS: Fall, ICD/Pacemaker, and Other: G-Tube  RED FLAGS: None   WEIGHT BEARING RESTRICTIONS: No  FALLS: Has patient fallen in last 6 months? No  LIVING ENVIRONMENT: Lives with:  cousin Lives in: House/apartment Stairs: Yes: Internal: 12 steps; on right going up Has following equipment at home: Walker - 4 wheeled  PLOF: Needs assistance with ADLs and Needs assistance with homemaking  PATIENT GOALS: improve walking.   OBJECTIVE:   Note: Objective measures were completed at Evaluation unless otherwise noted. COGNITION: Overall cognitive status: Within functional limits for tasks assessed  LOWER EXTREMITY ROM:     Active  Right Eval  Left Eval  Hip flexion    Hip extension    Hip abduction    Hip adduction    Hip internal rotation    Hip external rotation    Knee flexion    Knee extension    Ankle dorsiflexion    Ankle plantarflexion    Ankle inversion    Ankle eversion     (Blank rows = not tested)  LOWER EXTREMITY MMT:    MMT Right Eval Left Eval  Hip flexion    Hip extension    Hip abduction    Hip adduction    Hip internal rotation    Hip external rotation    Knee flexion    Knee extension    Ankle dorsiflexion    Ankle plantarflexion    Ankle inversion    Ankle eversion    (Blank rows = not tested)  BED MOBILITY:  Sit to supine Complete Independence Supine to sit Complete Independence Rolling to Right Complete Independence Rolling to Left Complete Independence  TRANSFERS: Assistive device utilized: Environmental consultant - 4 wheeled  Sit to stand: Modified independence Stand to sit: Modified independence Chair to chair: Modified independence  GAIT: Gait pattern: decreased arm swing- Right, decreased arm swing- Left, decreased step length- Right, decreased step length- Left, Right foot flat, Left foot flat, knee flexed in stance- Right, knee flexed in stance- Left, decreased trunk rotation, trunk flexed, poor foot clearance- Right, and poor foot clearance- Left Distance walked: 530' Assistive device utilized: Environmental consultant - 4 wheeled Level of assistance: SBA   FUNCTIONAL TESTS:  5 times sit to stand: 13 sec no UE support 6 minute walk test: SpO2 before walk was 96% 530 feet with RW and one standing break. SpO2 during break wa 98%, pt on 3L of O2.  Ankle AROM: (09/14/23)  Dorsiflexion: R = 10 deg; L = lacks 8 deg to neutral with increased supination noted in L foot.  Plantarflexion: R = 40 deg; L   = 20 deg                                                                                                                              TREATMENT:  TherEx SciFit: level 5 for 10' pt educated on controlling pace to make sure she is moving for the entirety of 10'. Pt reported pain in back of the knees. Seat was adjusted (bwd) and pt reported alleviation of her knee discomfort.   Supine hooklying lower trunk rotations: 20x Supine hooklying marching: 2 x 10 R and L Supine double knee to chest with 500 g ball between knees: 3 x 5 SLR: 2  x 8 R and L Bridge: 2 x 10 Manually stretched left gastroc and soleus  Walking fwd and bwd: 5 x 10 feet, cues for arm swings for balance Walking laterally: 3 x 10 feet R and L  Added stretches to HEP, see bolded below   PATIENT EDUCATION: Education details: continue HEP,  added to HEP Person educated: Patient Education method: Explanation, Demonstration, and Handouts Education comprehension: verbalized understanding, returned demonstration, and needs further education  HOME EXERCISE PROGRAM: Access Code: [email or web address] URL: [email or web address] Date: 09/14/2023 Prepared by: Lavone Nian  Exercises - Supine March  - 1 x daily - 7 x weekly - 2 sets - 10 reps - Bilateral Bent Leg Lift  - 1 x daily - 7 x weekly - 2 sets - 10 reps - Supine Straight Leg Raises  - 1 x daily - 7 x weekly - 2 sets - 10 reps - Supine Bridge  - 1 x daily - 7 x weekly - 2 sets - 10 reps - Sidelying Hip Abduction  - 1 x daily - 7 x weekly - 2 sets - 10 reps - Seated Heel Toe Raises  - 1 x daily - 7 x weekly - 2 sets - 10 reps - Seated Heel Raise  - 1 x daily - 7 x weekly - 2 sets - 10 reps - Modified Thomas Stretch  - 1 x daily - 7 x weekly - 1 sets - 3-5 reps - 30 sec hold - Standing Gastroc Stretch at Counter  - 1 x daily - 7 x weekly - 1 sets - 3-5 reps - 30 sec hold - Standing Hip Flexor Stretch  - 1 x daily - 7 x weekly - 1 sets - 3-5 reps - 30 sec  hold   GOALS: Goals reviewed with patient? Yes  SHORT TERM GOALS: Target date: 10/09/2023    Patient will be 50% compliant with Hep to self manage her symptoms.  Baseline: TBD Goal status: INITIAL     LONG TERM GOALS: Target date: 11/06/2023    Patient will demo >700' of ambulation in 6 minutes with RW to improve walking endurance.  Baseline: 530' RW (09/11/23) Goal status: INITIAL  2.  Patient will be able to perform SLS of >5 sec on R and L LE to improve static balance.  Baseline: 1 sec on L LE; 2 sec on R LE (09/11/23) Goal status: INITIAL  3.  Pt will perform 5x sit to stand without UE support in <10 sec to improve overall functional strength.  Baseline: 13 sec without UE support (09/11/23) Goal status: INITIAL  4.  Pt will demo 100% compliance with HEP to self manage her symptoms . Baseline: TBD Goal status: INITIAL   ASSESSMENT:  CLINICAL IMPRESSION: Tolerated session well with exercise progression and gait progression.   OBJECTIVE IMPAIRMENTS: Abnormal gait, decreased activity tolerance, decreased balance, decreased endurance, decreased mobility, difficulty walking, decreased strength, impaired flexibility, impaired tone, impaired UE functional use, postural dysfunction, and pain.   ACTIVITY LIMITATIONS: carrying, lifting, bending, standing, squatting, stairs, transfers, and bathing  PARTICIPATION LIMITATIONS: meal prep, cleaning, laundry, driving, shopping, community activity, and yard work  PERSONAL FACTORS: Age and Time since onset of injury/illness/exacerbation are also affecting patient's functional outcome.   REHAB POTENTIAL: Good  CLINICAL DECISION MAKING: Stable/uncomplicated  EVALUATION COMPLEXITY: Low  PLAN:  PT FREQUENCY: 2x/week  PT DURATION: 6 weeks  PLANNED INTERVENTIONS: 97164- PT Re-evaluation, 97110-Therapeutic exercises, 97530- Therapeutic activity, 97112- Neuromuscular re-education, 97535- Self Care, 16109- Manual therapy, [phone]- Gait  training, Patient/Family education, Balance training, Stair training, Joint mobilization, DME instructions, Cryotherapy, and Moist heat  PLAN FOR NEXT SESSION:  Aquatic therapy discussed as pt was previously receiving it. Pt does't want aquatic therapy during this episode. How is HEP? Add/adjust PRN, work on global endurance, functional strengthening, ROM/stretching  Check on orders  for shower chair.- didn't find orders, sent message to [name] on 1/10, still no orders 1/21   Ileana Ladd, PT  09/28/2023, 8:48 AM

## 2023-09-28 NOTE — Therapy (Signed)
OUTPATIENT SPEECH LANGUAGE PATHOLOGY TREATMENT NOTE   Patient Name: Cynthia Underwood MRN: [id number] DOB:1971-06-02, 53 y.o., female Today's Date: 09/28/2023  PCP: Hoy Register, MD REFERRING PROVIDER: Hoy Register, MD  END OF SESSION:  End of Session - 09/28/23 1209     Visit Number 7    Number of Visits 17    Date for SLP Re-Evaluation 10/15/23    SLP Start Time 0933    SLP Stop Time  1015    SLP Time Calculation (min) 42 min    Activity Tolerance Patient tolerated treatment well              Past Medical History:  Diagnosis Date   Bifascicular block 10/02/2014   Cataract    Dyspnea    Excess ear wax    Multinodular goiter    Pneumonia    Presence of permanent cardiac pacemaker    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   Sickle cell trait (HCC)    Watery eyes    Left   Weakness of both legs    Past Surgical History:  Procedure Laterality Date   BREAST BIOPSY Right    EYE SURGERY Left 2017   "related to blockage in my nose"   EYE SURGERY Right 05/2019   INSERT / REPLACE / REMOVE PACEMAKER  02/15/2017   IR GASTROSTOMY TUBE MOD SED  01/01/2023   PACEMAKER IMPLANT N/A 02/15/2017   Procedure: Pacemaker Implant;  Surgeon: Duke Salvia, MD;  Location: [facility] INVASIVE CV LAB;  Service: Cardiovascular;  Laterality: N/A;   RIGHT/LEFT HEART CATH AND CORONARY ANGIOGRAPHY N/A 11/16/2022   Procedure: RIGHT/LEFT HEART CATH AND CORONARY ANGIOGRAPHY;  Surgeon: Laurey Morale, MD;  Location: [facility] INVASIVE CV LAB;  Service: Cardiovascular;  Laterality: N/A;   THYROIDECTOMY N/A 04/21/2021   Procedure: TOTAL THYROIDECTOMY;  Surgeon: Axel Filler, MD;  Location: [facility] OR;  Service: General;  Laterality: N/A;   Patient Active Problem List   Diagnosis Date Noted   Aspiration pneumonia (HCC) 05/21/2023   S/P percutaneous endoscopic gastrostomy (PEG) tube placement (HCC) 03/13/2023   Diarrhea 01/23/2023   SIRS (systemic inflammatory  response syndrome) (HCC) 01/23/2023   Chronic respiratory failure with hypoxia (HCC) 01/23/2023   Dysphagia 01/23/2023   Chronic systolic CHF (congestive heart failure) (HCC) 01/23/2023   Acute on chronic respiratory failure with hypoxia (HCC) 12/07/2022   Tracheostomy status (HCC) 12/07/2022   Generalized anxiety disorder 12/06/2022   Acute systolic heart failure (HCC) 11/26/2022   Drug-induced torsades de pointes (HCC) 11/17/2022   Acute systolic CHF (congestive heart failure) (HCC) 11/16/2022   Acute hypoxemic respiratory failure (HCC) 11/15/2022   Acute on chronic combined systolic and diastolic CHF (congestive heart failure) (HCC) 11/15/2022   Protein-calorie malnutrition, severe 11/15/2022   Cardiac arrest (HCC) 11/14/2022   Healthcare-associated pneumonia 11/11/2022   Hypothyroidism 11/07/2021   Thyroid nodule 04/21/2021   S/P total thyroidectomy 04/21/2021   Sinus node dysfunction (HCC) 03/10/2020   Weakness of both legs    Watery eyes    Sickle cell trait (HCC)    Presence of permanent cardiac pacemaker    Excess ear wax    Depression    Anemia    Cardiac pacemaker in situ 03/01/2017   Mobitz type 2 second degree heart block 02/15/2017   Symptomatic advanced heart block 02/12/2017   SOB (shortness of breath) 10/02/2014   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   Bifascicular block 10/02/2014  Infection due to trichomonas vaginalis 09/15/2014   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   PERTINENT HISTORY: myotonic dystophy, hypothyroidism, anxiety. PEG dependence.   PAIN:  Are you having pain? No  FALLS: Has patient fallen in last 6 months?  No  LIVING ENVIRONMENT: Lives with: lives with their family Lives in: House/apartment  PLOF:  Level of assistance: Independent with ADLs, Independent with IADLs Employment: On disability  PATIENT GOALS: Ability to tolerate p.o.  OBJECTIVE:  Note: Objective measures were completed at Evaluation unless otherwise noted. OBJECTIVE:   INSTRUMENTAL SWALLOW STUDY FINDINGS (MBSS) 06/18/2023 Clinical Impression Pt continues to exhibit signifcant pharyngoesophageal dysphagia marked by reduced tongue base retraction, decreased laryngeal elevation, no epiglottic deflection and reduced anterior hyoid excursion. Majority of boluses remained in valleculae and pyriform sinuses with reduced UES opening. Thin liquid with teaspoon penetrated near cord level, cups sip thin penetrated after the swallow (PAS 3), nectar thick penetrated from residue and puree while performing a chin tuck spilled into airway (PAS 3). Pt did throat clear several times throughout study, however overall sensation was reduced. She demonstrated significant difficulty initiating a second swallow stating that her mouth was dry in attempts to clear residue. A left and right head turn and dry swallows were not effective. Esophageal scan revealed stasis in distal esophagus. Recommend pt continue NPO status and use PEG for nutrition. She may have sips of thin water (only) after oral care, however pt states that she does not like water. She also states that she gets hungry despite her tube feedings. Pt and cousin report she has not been having home health ST and SLP recommends home health ST to work on pharyngeal strength.  COGNITION: Overall cognitive status: Within  functional limits for tasks assessed  SUBJECTIVE DYSPHAGIA REPORTS:  Date of onset: March 2024 Reported symptoms: coughing with both solids and liquids, globus sensation, xerostomia, shortness of breath with PO, and weak voice  Current diet:  PEG dependent, thin liquid (water) PO  Co-morbid voice changes: Yes  Weight Loss: 20 lbs   FACTORS WHICH MAY INCREASE RISK OF ADVERSE EVENT IN PRESENCE OF ASPIRATION:  General health: frail or deconditioned  Risk factors: reduced respiratory function, weak cough, GERD or other GI disease, and tube present (PEG)     ORAL MOTOR EXAMINATION: Overall status: Impaired: overall reduced strength evidenced   CLINICAL SWALLOW ASSESSMENT:   Dentition: dentures (upper), some natural dentition on bottom -- eats without teeth  Vocal quality at baseline: hoarse, low vocal intensity, and vocal fatigue Patient directly observed with POs: Yes: thin liquids  Feeding: able to feed self Liquids provided by: teaspoon and cup Yale Swallow Protocol: Pass Oral phase signs and symptoms: anterior loss/spillage Pharyngeal phase signs and symptoms: multiple swallows, wet vocal quality, delayed throat clear, and delayed cough  PATIENT REPORTED OUTCOME MEASURES (PROM): deferred   TODAY'S TREATMENT:     09/28/23: ST facilitated Shaker exercise targeting increased strength/control of pharyngeal muscles. Pt completed the targeted exercise effectively with improved muscle control given min-A.  ST implemented practice sucking against resistance to continue targeting improved swallow function. Pt completed cup of apple sauce w/ straw sucking against resistance given mod/min-A and completed ~30 effortful swallows. Pt reported daily practice with effortful swallows. ST plans to continue practice implementing exercises in future sessions and reviewed need for increased practice with exercises at home.  DYSPHAGIA TREATMENT:   Current diet: PEG dependent Patient directly observed with  POs: Yes: dysphagia 1 (puree) and thin liquids  Feeding: able to feed self Liquids provided by: cup, puree via tsp Oral phase signs and symptoms: na Pharyngeal phase signs and symptoms: na Therapeutic exercises:  Effortful Swallow: multiple trials with puree and thin liquid Shaker: x 20 with min-A. Types of cueing: verbal and tactile Amount of cueing: moderate Treatment comments: demonstrates understanding of prescribed interventions with increased independence as session progressed.     PATIENT EDUCATION: Education details: see above Person educated: Patient and Caregiver cousin Education method: Explanation, Demonstration, Verbal cues, and Handouts Education comprehension: verbalized understanding, returned demonstration, and needs further education   ASSESSMENT:  CLINICAL IMPRESSION: Patient is a 53 y.o. F who was seen today for dysphagia treatment session. Pt mostly compliant with HEP. Evidencing mod-I for prescribed exercises during therapy session. Reduced insight into deficits evidenced. Continues to benefit from skilled ST to address dysphagia to improve QoL.    OBJECTIVE IMPAIRMENTS: include dysphagia. These impairments are limiting patient from safety when swallowing. Factors affecting potential to achieve goals and functional outcome are severity of impairments. Patient will benefit from skilled SLP services to address above impairments and improve overall function.  REHAB POTENTIAL: Good   GOALS: Goals reviewed with patient? Yes  SHORT TERM GOALS: Target date: 09/14/2023   Pt will report daily HEP completion over 1 week period  Baseline: Goal status: MET  2.  Pt will accurately demonstrate dysphagia exercises with min-A Baseline:  Goal status: MET  3.  Pt will increase oral care BID over one week period Baseline:  Goal status: MET   LONG TERM GOALS: Target date: 10/12/2023  Pt will be independent with dysphagia HEP Baseline:  Goal status: IN  PROGRESS  2.  Pt will complete MBSS  Baseline:  Goal status: INITIAL  3.  Pt will utilize swallow compensations and strategies to support PO consumption of least restrictive diet based on function demonstrated on repeat instrumental swallow study Baseline:  Goal status: IN PROGRESS    PLAN:  SLP FREQUENCY: 2x/week  SLP DURATION: 8 weeks  PLANNED INTERVENTIONS: 92526 Treatment of swallowing function, Re-evaluation, Aspiration precaution training, Pharyngeal strengthening exercises, Diet toleration management , Trials of upgraded texture/liquids, SLP instruction and feedback, Compensatory strategies, and Patient/family education    [place], Student-SLP 09/28/2023, 12:11 PM

## 2023-10-01 ENCOUNTER — Other Ambulatory Visit: Payer: Self-pay | Admitting: Family Medicine

## 2023-10-01 ENCOUNTER — Ambulatory Visit: Payer: 59

## 2023-10-01 DIAGNOSIS — R2681 Unsteadiness on feet: Secondary | ICD-10-CM

## 2023-10-01 DIAGNOSIS — R2689 Other abnormalities of gait and mobility: Secondary | ICD-10-CM

## 2023-10-01 DIAGNOSIS — M6281 Muscle weakness (generalized): Secondary | ICD-10-CM | POA: Diagnosis not present

## 2023-10-01 DIAGNOSIS — Z9181 History of falling: Secondary | ICD-10-CM | POA: Diagnosis not present

## 2023-10-01 DIAGNOSIS — R1310 Dysphagia, unspecified: Secondary | ICD-10-CM | POA: Diagnosis not present

## 2023-10-01 DIAGNOSIS — R29898 Other symptoms and signs involving the musculoskeletal system: Secondary | ICD-10-CM | POA: Diagnosis not present

## 2023-10-01 NOTE — Therapy (Unsigned)
OUTPATIENT SPEECH LANGUAGE PATHOLOGY TREATMENT NOTE   Patient Name: Cynthia Underwood MRN: [id number] DOB:[birth year], 53 y.o., female Today's Date: 10/02/2023  PCP: Hoy Register, MD REFERRING PROVIDER: Hoy Register, MD  END OF SESSION:  End of Session - 10/02/23 1220     Visit Number 8    Number of Visits 17    Date for SLP Re-Evaluation 10/15/23    SLP Start Time 1227    SLP Stop Time  1300    SLP Time Calculation (min) 33 min    Activity Tolerance Patient tolerated treatment well               Past Medical History:  Diagnosis Date   Bifascicular block 10/02/2014   Cataract    Dyspnea    Excess ear wax    Multinodular goiter    Pneumonia    Presence of permanent cardiac pacemaker    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   Sickle cell trait (HCC)    Watery eyes    Left   Weakness of both legs    Past Surgical History:  Procedure Laterality Date   BREAST BIOPSY Right    EYE SURGERY Left 2017   "related to blockage in my nose"   EYE SURGERY Right 05/2019   INSERT / REPLACE / REMOVE PACEMAKER  02/15/2017   IR GASTROSTOMY TUBE MOD SED  01/01/2023   PACEMAKER IMPLANT N/A 02/15/2017   Procedure: Pacemaker Implant;  Surgeon: Duke Salvia, MD;  Location: [facility] INVASIVE CV LAB;  Service: Cardiovascular;  Laterality: N/A;   RIGHT/LEFT HEART CATH AND CORONARY ANGIOGRAPHY N/A 11/16/2022   Procedure: RIGHT/LEFT HEART CATH AND CORONARY ANGIOGRAPHY;  Surgeon: Laurey Morale, MD;  Location: [facility] INVASIVE CV LAB;  Service: Cardiovascular;  Laterality: N/A;   THYROIDECTOMY N/A 04/21/2021   Procedure: TOTAL THYROIDECTOMY;  Surgeon: Axel Filler, MD;  Location: [facility] OR;  Service: General;  Laterality: N/A;   Patient Active Problem List   Diagnosis Date Noted   Aspiration pneumonia (HCC) 05/21/2023   S/P percutaneous endoscopic gastrostomy (PEG) tube placement (HCC) 03/13/2023   Diarrhea 01/23/2023   SIRS (systemic inflammatory  response syndrome) (HCC) 01/23/2023   Chronic respiratory failure with hypoxia (HCC) 01/23/2023   Dysphagia 01/23/2023   Chronic systolic CHF (congestive heart failure) (HCC) 01/23/2023   Acute on chronic respiratory failure with hypoxia (HCC) 12/07/2022   Tracheostomy status (HCC) 12/07/2022   Generalized anxiety disorder 12/06/2022   Acute systolic heart failure (HCC) 11/26/2022   Drug-induced torsades de pointes (HCC) 11/17/2022   Acute systolic CHF (congestive heart failure) (HCC) 11/16/2022   Acute hypoxemic respiratory failure (HCC) 11/15/2022   Acute on chronic combined systolic and diastolic CHF (congestive heart failure) (HCC) 11/15/2022   Protein-calorie malnutrition, severe 11/15/2022   Cardiac arrest (HCC) 11/14/2022   Healthcare-associated pneumonia 11/11/2022   Hypothyroidism 11/07/2021   Thyroid nodule 04/21/2021   S/P total thyroidectomy 04/21/2021   Sinus node dysfunction (HCC) 03/10/2020   Weakness of both legs    Watery eyes    Sickle cell trait (HCC)    Presence of permanent cardiac pacemaker    Excess ear wax    Depression    Anemia    Cardiac pacemaker in situ 03/01/2017   Mobitz type 2 second degree heart block 02/15/2017   Symptomatic advanced heart block 02/12/2017   SOB (shortness of breath) 10/02/2014   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   Bifascicular block 10/02/2014  Infection due to trichomonas vaginalis 09/15/2014   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   PERTINENT HISTORY: myotonic dystophy, hypothyroidism, anxiety. PEG dependence.   PAIN:  Are you having pain? No  FALLS: Has patient fallen in last 6 months?  No  LIVING ENVIRONMENT: Lives with: lives with their family Lives in: House/apartment  PLOF:  Level of assistance: Independent with ADLs, Independent with IADLs Employment: On disability  PATIENT GOALS: Ability to tolerate p.o.  OBJECTIVE:  Note: Objective measures were completed at Evaluation unless otherwise noted. OBJECTIVE:   INSTRUMENTAL SWALLOW STUDY FINDINGS (MBSS) 06/18/2023 Clinical Impression Pt continues to exhibit signifcant pharyngoesophageal dysphagia marked by reduced tongue base retraction, decreased laryngeal elevation, no epiglottic deflection and reduced anterior hyoid excursion. Majority of boluses remained in valleculae and pyriform sinuses with reduced UES opening. Thin liquid with teaspoon penetrated near cord level, cups sip thin penetrated after the swallow (PAS 3), nectar thick penetrated from residue and puree while performing a chin tuck spilled into airway (PAS 3). Pt did throat clear several times throughout study, however overall sensation was reduced. She demonstrated significant difficulty initiating a second swallow stating that her mouth was dry in attempts to clear residue. A left and right head turn and dry swallows were not effective. Esophageal scan revealed stasis in distal esophagus. Recommend pt continue NPO status and use PEG for nutrition. She may have sips of thin water (only) after oral care, however pt states that she does not like water. She also states that she gets hungry despite her tube feedings. Pt and cousin report she has not been having home health ST and SLP recommends home health ST to work on pharyngeal strength.  COGNITION: Overall cognitive status: Within functional limits for tasks  assessed  SUBJECTIVE DYSPHAGIA REPORTS:  Date of onset: March 2024 Reported symptoms: coughing with both solids and liquids, globus sensation, xerostomia, shortness of breath with PO, and weak voice  Current diet:  PEG dependent, thin liquid (water) PO  Co-morbid voice changes: Yes  Weight Loss: 20 lbs   FACTORS WHICH MAY INCREASE RISK OF ADVERSE EVENT IN PRESENCE OF ASPIRATION:  General health: frail or deconditioned  Risk factors: reduced respiratory function, weak cough, GERD or other GI disease, and tube present (PEG)     ORAL MOTOR EXAMINATION: Overall status: Impaired: overall reduced strength evidenced   CLINICAL SWALLOW ASSESSMENT:   Dentition: dentures (upper), some natural dentition on bottom -- eats without teeth  Vocal quality at baseline: hoarse, low vocal intensity, and vocal fatigue Patient directly observed with POs: Yes: thin liquids  Feeding: able to feed self Liquids provided by: teaspoon and cup Yale Swallow Protocol: Pass Oral phase signs and symptoms: anterior loss/spillage Pharyngeal phase signs and symptoms: multiple swallows, wet vocal quality, delayed throat clear, and delayed cough  PATIENT REPORTED OUTCOME MEASURES (PROM): deferred   TODAY'S TREATMENT:     10/02/23: Discussed swallow HEP, in which pt reports completing effortful swallows at home. Not completing specific number per patient report; reportedly completing effortful swallows with sips of  water throughout day. Recommended aiming for effortful swallows 50x/day at minimum. Reviewed importance of maximal effort to achieve greatest benefit during effortful swallows. With additional review of HEP in patient instructions, pt endorsed completing Mendelsohn and CTAR. Denied difficulty or need for review. Introduced Masako swallow today to address reduced BOT retraction. Modified exercise to elevate tongue tip up versus protrusion out d/t difficulty demonstrated. Pt completed effortful swallows x10 with  tsp sips of water and modified masakos x7. Intermittent delayed coughing noted; cued strong cough to clear. Updated HEP to include 10 Masako swallows per day. Denied additional questions.   09/28/23: ST facilitated Shaker exercise targeting increased strength/control of pharyngeal muscles. Pt completed the targeted exercise effectively with improved muscle control given min-A. ST implemented practice sucking against resistance to continue targeting improved swallow function. Pt completed cup of apple sauce w/ straw sucking against resistance given mod/min-A and completed ~30 effortful swallows. Pt reported daily practice with effortful swallows. ST plans to continue practice implementing exercises in future sessions and reviewed need for increased practice with exercises at home.  DYSPHAGIA TREATMENT:   Current diet: PEG dependent Patient directly observed with POs: Yes: dysphagia 1 (puree) and thin liquids  Feeding: able to feed self Liquids provided by: cup, puree via tsp Oral phase signs and symptoms: na Pharyngeal phase signs and symptoms: na Therapeutic exercises:  Effortful Swallow: multiple trials with puree and thin liquid Shaker: x 20 with min-A. Types of cueing: verbal and tactile Amount of cueing: moderate Treatment comments: demonstrates understanding of prescribed interventions with increased independence as session progressed.     PATIENT EDUCATION: Education details: see above Person educated: Patient and Caregiver cousin Education method: Explanation, Demonstration, Verbal cues, and Handouts Education comprehension: verbalized understanding, returned demonstration, and needs further education   ASSESSMENT:  CLINICAL IMPRESSION: Patient is a 53 y.o. F who was seen today for dysphagia treatment session. Pt mostly compliant with HEP. Evidencing mod-I for prescribed exercises during therapy session. Reduced insight into deficits evidenced. Continues to benefit from skilled ST  to address dysphagia to improve QoL.    OBJECTIVE IMPAIRMENTS: include dysphagia. These impairments are limiting patient from safety when swallowing. Factors affecting potential to achieve goals and functional outcome are severity of impairments. Patient will benefit from skilled SLP services to address above impairments and improve overall function.  REHAB POTENTIAL: Good   GOALS: Goals reviewed with patient? Yes  SHORT TERM GOALS: Target date: 09/14/2023   Pt will report daily HEP completion over 1 week period  Baseline: Goal status: MET  2.  Pt will accurately demonstrate dysphagia exercises with min-A Baseline:  Goal status: MET  3.  Pt will increase oral care BID over one week period Baseline:  Goal status: MET   LONG TERM GOALS: Target date: 10/12/2023  Pt will be independent with dysphagia HEP Baseline:  Goal status: IN PROGRESS  2.  Pt will complete MBSS  Baseline:  Goal status: IN PROGRESS  3.  Pt will utilize swallow compensations and strategies to support PO consumption of least restrictive diet based on function demonstrated on repeat instrumental swallow study Baseline:  Goal status: IN PROGRESS    PLAN:  SLP FREQUENCY: 2x/week  SLP DURATION: 8 weeks  PLANNED INTERVENTIONS: 92526 Treatment of swallowing function, Re-evaluation, Aspiration precaution training, Pharyngeal strengthening exercises, Diet toleration management , Trials of upgraded texture/liquids, SLP instruction and feedback, Compensatory strategies, and Patient/family education    Gracy Racer, CCC-SLP 10/02/2023, 1:01 PM

## 2023-10-01 NOTE — Therapy (Signed)
OUTPATIENT PHYSICAL THERAPY NEURO TREATMENT   Patient Name: Cynthia Underwood MRN: [id number] DOB:[birth year], 53 y.o., female Today's Date: 10/01/2023   PCP:[name] REFERRING PROVIDER: Hoy Register, MD  END OF SESSION:  PT End of Session - 10/01/23 1325     Visit Number 5    Number of Visits 13    Date for PT Re-Evaluation 11/06/23    Authorization Type UHC Medicare/Medicaid    PT Start Time 1320    PT Stop Time 1405    PT Time Calculation (min) 45 min    Equipment Utilized During Treatment Gait belt    Activity Tolerance Patient tolerated treatment well    Behavior During Therapy WFL for tasks assessed/performed              Past Medical History:  Diagnosis Date   Bifascicular block 10/02/2014   Cataract    Dyspnea    Excess ear wax    Multinodular goiter    Pneumonia    Presence of permanent cardiac pacemaker    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   Sickle cell trait (HCC)    Watery eyes    Left   Weakness of both legs    Past Surgical History:  Procedure Laterality Date   BREAST BIOPSY Right    EYE SURGERY Left 2017   "related to blockage in my nose"   EYE SURGERY Right 05/2019   INSERT / REPLACE / REMOVE PACEMAKER  02/15/2017   IR GASTROSTOMY TUBE MOD SED  01/01/2023   PACEMAKER IMPLANT N/A 02/15/2017   Procedure: Pacemaker Implant;  Surgeon: Duke Salvia, MD;  Location: [facility] INVASIVE CV LAB;  Service: Cardiovascular;  Laterality: N/A;   RIGHT/LEFT HEART CATH AND CORONARY ANGIOGRAPHY N/A 11/16/2022   Procedure: RIGHT/LEFT HEART CATH AND CORONARY ANGIOGRAPHY;  Surgeon: Laurey Morale, MD;  Location: [facility] INVASIVE CV LAB;  Service: Cardiovascular;  Laterality: N/A;   THYROIDECTOMY N/A 04/21/2021   Procedure: TOTAL THYROIDECTOMY;  Surgeon: Axel Filler, MD;  Location: [facility] OR;  Service: General;  Laterality: N/A;   Patient Active Problem List   Diagnosis Date Noted   Aspiration pneumonia (HCC)  05/21/2023   S/P percutaneous endoscopic gastrostomy (PEG) tube placement (HCC) 03/13/2023   Diarrhea 01/23/2023   SIRS (systemic inflammatory response syndrome) (HCC) 01/23/2023   Chronic respiratory failure with hypoxia (HCC) 01/23/2023   Dysphagia 01/23/2023   Chronic systolic CHF (congestive heart failure) (HCC) 01/23/2023   Acute on chronic respiratory failure with hypoxia (HCC) 12/07/2022   Tracheostomy status (HCC) 12/07/2022   Generalized anxiety disorder 12/06/2022   Acute systolic heart failure (HCC) 11/26/2022   Drug-induced torsades de pointes (HCC) 11/17/2022   Acute systolic CHF (congestive heart failure) (HCC) 11/16/2022   Acute hypoxemic respiratory failure (HCC) 11/15/2022   Acute on chronic combined systolic and diastolic CHF (congestive heart failure) (HCC) 11/15/2022   Protein-calorie malnutrition, severe 11/15/2022   Cardiac arrest (HCC) 11/14/2022   Healthcare-associated pneumonia 11/11/2022   Hypothyroidism 11/07/2021   Thyroid nodule 04/21/2021   S/P total thyroidectomy 04/21/2021   Sinus node dysfunction (HCC) 03/10/2020   Weakness of both legs    Watery eyes    Sickle cell trait (HCC)    Presence of permanent cardiac pacemaker    Excess ear wax    Depression    Anemia    Cardiac pacemaker in situ 03/01/2017   Mobitz type 2 second degree heart block 02/15/2017   Symptomatic advanced heart block 02/12/2017  SOB (shortness of breath) 10/02/2014   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   Bifascicular block 10/02/2014   Infection due to trichomonas vaginalis 09/15/2014   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 No falls.  Pt accompanied by: self  PERTINENT HISTORY: Starting around the age of 44, she started having lock jaw and stiffness of the hands.  Over the years, she developed worsening stiffness of her fingers and hands.  she ultimately was genetically tested at the age of 69 which confirmed the diagnosis of myotonic dystrophy type 1. She has a strong family history of DM1 including maternal aunt, cousins x 2, and younger sister.  She does not have any children and has no future plans for pregnancy.   Symptoms were relatively stable until her mid-30s and then started developing fatigue, weakness, daytime sleepiness, and shortness of breath with exertion.  She walks independently but was told previously told to use leg braces.  Over the past few years, she noticed intermittent difficulty swallowing liquids. She underwent barium swallow which showed signs of aspiration and she was recommended to use a straw and use chin tuck position.     She was seeing several neurologists over the years at [address] in [place].  She is not working and has been on disability since 2010.  PAIN:  Are you having pain? Yes: NPRS scale: 8/10 Pain location: GI tube insertion Pain description: achy Aggravating factors: none Relieving factors: none  PRECAUTIONS: Fall, ICD/Pacemaker, and Other: G-Tube  RED FLAGS: None   WEIGHT BEARING RESTRICTIONS: No  FALLS: Has patient fallen in last 6 months? No  LIVING ENVIRONMENT: Lives with:  cousin Lives in: House/apartment Stairs: Yes: Internal: 12 steps; on right going up Has following equipment at home: Walker - 4 wheeled  PLOF: Needs assistance with ADLs and Needs assistance with homemaking  PATIENT GOALS: improve walking.   OBJECTIVE:   Note: Objective measures were completed at Evaluation unless otherwise noted. COGNITION: Overall cognitive status: Within functional limits for tasks assessed  LOWER EXTREMITY ROM:     Active  Right Eval  Left Eval  Hip flexion    Hip extension    Hip abduction    Hip adduction    Hip internal rotation    Hip external rotation    Knee flexion    Knee extension    Ankle dorsiflexion    Ankle plantarflexion    Ankle inversion    Ankle eversion     (Blank rows = not tested)  LOWER EXTREMITY MMT:    MMT Right Eval Left Eval  Hip flexion    Hip extension    Hip abduction    Hip adduction    Hip internal rotation    Hip external rotation    Knee flexion    Knee extension    Ankle dorsiflexion    Ankle plantarflexion    Ankle inversion    Ankle eversion    (Blank rows = not tested)  BED MOBILITY:  Sit to supine Complete Independence Supine to sit Complete Independence Rolling to Right Complete Independence Rolling to Left Complete Independence  TRANSFERS: Assistive device utilized: Environmental consultant - 4 wheeled  Sit to stand: Modified independence Stand to sit: Modified independence Chair to chair: Modified independence  GAIT: Gait pattern: decreased arm swing- Right, decreased arm swing- Left, decreased step length- Right, decreased step length- Left, Right foot flat, Left foot flat, knee flexed in stance- Right, knee flexed in stance- Left, decreased trunk rotation, trunk flexed, poor foot clearance- Right, and poor foot clearance- Left Distance walked: 530' Assistive device utilized: Environmental consultant - 4 wheeled Level of assistance: SBA   FUNCTIONAL TESTS:  5 times sit to stand: 13 sec no UE support 6 minute walk test: SpO2 before walk was 96% 530 feet with RW and one standing break. SpO2 during break wa 98%, pt on 3L of O2.  Ankle AROM: (09/14/23)  Dorsiflexion: R = 10 deg; L = lacks 8 deg to neutral with increased supination noted in L foot.  Plantarflexion: R = 40 deg; L   = 20 deg                                                                                                                              TREATMENT: Pt was wearing O2 when received from PT but didn't wear O2 during the therapy session. TherEx Supine hooklying lower trunk rotations: 20x Supine hooklying marching: 3 x 10 R and L, 1lbs at each ankle Supine double knee to chest with 500 g ball between knees + 1lbs at each ankle: 3 x 5 SLR: 2  x 10 R and L, 1lbs Bridge: 2 x 10 SLS on floor: 10 x 5" holds  R and L- pt was able to stand for 16 sec on R LE and 15 sec on L LE for her best trial Tandem walk: fwd only: 4 x 10 feet  Walking fwd and bwd: 5 x 10 feet, cues for arm swings for balance Walking laterally: 3 x 10 feet R and L  SPO2 was at 94% , 113 bpm HR after above exercise without added supplemental O2.   Scifit: level 1 for 8' SPO2 monitored every 2 min, at 2' 95%133 bpm 4'; after 8' 91 % and 123bpm, Pt was asked to put back on her supplemental O2 at end of the session for 2' (pt chose 4 L). Patient was asymptomatic without dyspnea. .   Her SpO2 was at 98% and HR 76bpm before patient left.   Added stretches to HEP, see bolded below   PATIENT EDUCATION: Education details: continue HEP, added to HEP Person educated: Patient Education method: Explanation, Demonstration, and Handouts Education comprehension: verbalized understanding, returned demonstration, and needs further education  HOME EXERCISE PROGRAM: Access Code: [email or web address] URL: [email or web address] Date: 09/14/2023 Prepared by: Lavone Nian  Exercises - Supine March  - 1 x daily - 7 x weekly - 2 sets - 10 reps - Bilateral Bent Leg Lift  - 1 x daily - 7 x weekly - 2 sets - 10 reps - Supine Straight Leg Raises  - 1 x daily - 7 x weekly - 2 sets - 10 reps - Supine Bridge  - 1 x daily - 7 x weekly - 2 sets - 10 reps - Sidelying Hip Abduction  - 1 x daily - 7 x weekly - 2 sets - 10 reps - Seated Heel Toe Raises   - 1 x daily - 7 x weekly - 2 sets - 10 reps - Seated Heel Raise  - 1 x daily - 7 x weekly - 2 sets - 10 reps - Modified Thomas Stretch  - 1 x daily - 7 x weekly - 1 sets - 3-5 reps - 30 sec hold - Standing Gastroc Stretch at Counter  - 1 x daily - 7 x weekly - 1 sets - 3-5 reps - 30 sec hold - Standing Hip Flexor Stretch  - 1 x daily - 7 x weekly - 1 sets - 3-5 reps - 30 sec hold   GOALS: Goals reviewed with patient? Yes  SHORT TERM GOALS: Target date: 10/09/2023    Patient will be 50% compliant with Hep to self manage her symptoms.  Baseline: TBD Goal status: INITIAL     LONG TERM GOALS: Target date: 11/06/2023    Patient will demo >700' of ambulation in 6 minutes with RW to improve walking endurance.  Baseline: 530' RW (09/11/23) Goal status: INITIAL  2.  Patient will be able to perform SLS of >5 sec on R and L LE to improve static balance.  Baseline: 1 sec on L LE; 2 sec on R LE (09/11/23); >15 sec  Rand L (10/01/23) Goal status: goal met  3.  Pt will perform 5x sit to stand without UE support in <10 sec to improve overall functional strength.  Baseline: 13 sec without UE support (09/11/23) Goal status: INITIAL  4.  Pt will demo 100% compliance with HEP to self manage her symptoms . Baseline: TBD Goal status: INITIAL   ASSESSMENT:  CLINICAL IMPRESSION: Tolerated progression of exercises well. Significantly improved SLS noted compared to evaluation and Met LTG#2 today and progressing well towards other goals.   OBJECTIVE IMPAIRMENTS: Abnormal gait, decreased activity tolerance, decreased balance, decreased endurance, decreased mobility, difficulty walking, decreased strength, impaired flexibility, impaired tone, impaired UE functional use, postural dysfunction, and pain.   ACTIVITY LIMITATIONS: carrying, lifting, bending, standing, squatting, stairs, transfers, and bathing  PARTICIPATION LIMITATIONS: meal prep, cleaning, laundry, driving, shopping, community activity, and  yard work  PERSONAL  FACTORS: Age and Time since onset of injury/illness/exacerbation are also affecting patient's functional outcome.   REHAB POTENTIAL: Good  CLINICAL DECISION MAKING: Stable/uncomplicated  EVALUATION COMPLEXITY: Low  PLAN:  PT FREQUENCY: 2x/week  PT DURATION: 6 weeks  PLANNED INTERVENTIONS: 97164- PT Re-evaluation, 97110-Therapeutic exercises, 97530- Therapeutic activity, 97112- Neuromuscular re-education, 97535- Self Care, 78295- Manual therapy, [phone]- Gait training, Patient/Family education, Balance training, Stair training, Joint mobilization, DME instructions, Cryotherapy, and Moist heat  PLAN FOR NEXT SESSION:  Continue to work on core strength, dynamic gait and mobility, cardiopulmonary endurance.      Ileana Ladd, PT  10/01/2023, 2:00 PM

## 2023-10-02 ENCOUNTER — Ambulatory Visit: Payer: 59 | Admitting: Physical Therapy

## 2023-10-02 ENCOUNTER — Ambulatory Visit: Payer: 59

## 2023-10-02 DIAGNOSIS — Z9181 History of falling: Secondary | ICD-10-CM | POA: Diagnosis not present

## 2023-10-02 DIAGNOSIS — R1310 Dysphagia, unspecified: Secondary | ICD-10-CM

## 2023-10-02 DIAGNOSIS — R2681 Unsteadiness on feet: Secondary | ICD-10-CM

## 2023-10-02 DIAGNOSIS — M6281 Muscle weakness (generalized): Secondary | ICD-10-CM

## 2023-10-02 DIAGNOSIS — R2689 Other abnormalities of gait and mobility: Secondary | ICD-10-CM

## 2023-10-02 DIAGNOSIS — R29898 Other symptoms and signs involving the musculoskeletal system: Secondary | ICD-10-CM | POA: Diagnosis not present

## 2023-10-02 NOTE — Patient Instructions (Addendum)
Recommendations:  Count number of repetitions when you do your exercises  Aim for 50 reps of effortful swallows per day  New exercises to complete daily: Modified Masako (tongue out swallow) Elevate your tongue tip below your front teeth and then swallow while holding your tongue there  Aim for at least 10 per day

## 2023-10-02 NOTE — Therapy (Signed)
OUTPATIENT PHYSICAL THERAPY NEURO TREATMENT   Patient Name: Cynthia Underwood MRN: [id number] DOB:Jan 27, 1971, 53 y.o., female Today's Date: 10/02/2023   PCP:[name] REFERRING PROVIDER: Hoy Register, MD  END OF SESSION:  PT End of Session - 10/02/23 1150     Visit Number 6    Number of Visits 13    Date for PT Re-Evaluation 11/06/23    Authorization Type UHC Medicare/Medicaid    PT Start Time 1148   pt arrived late   PT Stop Time 1226    PT Time Calculation (min) 38 min    Equipment Utilized During Treatment Gait belt    Activity Tolerance Patient tolerated treatment well    Behavior During Therapy WFL for tasks assessed/performed               Past Medical History:  Diagnosis Date   Bifascicular block 10/02/2014   Cataract    Dyspnea    Excess ear wax    Multinodular goiter    Pneumonia    Presence of permanent cardiac pacemaker    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   Sickle cell trait (HCC)    Watery eyes    Left   Weakness of both legs    Past Surgical History:  Procedure Laterality Date   BREAST BIOPSY Right    EYE SURGERY Left 2017   "related to blockage in my nose"   EYE SURGERY Right 05/2019   INSERT / REPLACE / REMOVE PACEMAKER  02/15/2017   IR GASTROSTOMY TUBE MOD SED  01/01/2023   PACEMAKER IMPLANT N/A 02/15/2017   Procedure: Pacemaker Implant;  Surgeon: Duke Salvia, MD;  Location: [facility] INVASIVE CV LAB;  Service: Cardiovascular;  Laterality: N/A;   RIGHT/LEFT HEART CATH AND CORONARY ANGIOGRAPHY N/A 11/16/2022   Procedure: RIGHT/LEFT HEART CATH AND CORONARY ANGIOGRAPHY;  Surgeon: Laurey Morale, MD;  Location: [facility] INVASIVE CV LAB;  Service: Cardiovascular;  Laterality: N/A;   THYROIDECTOMY N/A 04/21/2021   Procedure: TOTAL THYROIDECTOMY;  Surgeon: Axel Filler, MD;  Location: [facility] OR;  Service: General;  Laterality: N/A;   Patient Active Problem List   Diagnosis Date Noted   Aspiration  pneumonia (HCC) 05/21/2023   S/P percutaneous endoscopic gastrostomy (PEG) tube placement (HCC) 03/13/2023   Diarrhea 01/23/2023   SIRS (systemic inflammatory response syndrome) (HCC) 01/23/2023   Chronic respiratory failure with hypoxia (HCC) 01/23/2023   Dysphagia 01/23/2023   Chronic systolic CHF (congestive heart failure) (HCC) 01/23/2023   Acute on chronic respiratory failure with hypoxia (HCC) 12/07/2022   Tracheostomy status (HCC) 12/07/2022   Generalized anxiety disorder 12/06/2022   Acute systolic heart failure (HCC) 11/26/2022   Drug-induced torsades de pointes (HCC) 11/17/2022   Acute systolic CHF (congestive heart failure) (HCC) 11/16/2022   Acute hypoxemic respiratory failure (HCC) 11/15/2022   Acute on chronic combined systolic and diastolic CHF (congestive heart failure) (HCC) 11/15/2022   Protein-calorie malnutrition, severe 11/15/2022   Cardiac arrest (HCC) 11/14/2022   Healthcare-associated pneumonia 11/11/2022   Hypothyroidism 11/07/2021   Thyroid nodule 04/21/2021   S/P total thyroidectomy 04/21/2021   Sinus node dysfunction (HCC) 03/10/2020   Weakness of both legs    Watery eyes    Sickle cell trait (HCC)    Presence of permanent cardiac pacemaker    Excess ear wax    Depression    Anemia    Cardiac pacemaker in situ 03/01/2017   Mobitz type 2 second degree heart block 02/15/2017   Symptomatic advanced  heart block 02/12/2017   SOB (shortness of breath) 10/02/2014   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   Bifascicular block 10/02/2014   Infection due to trichomonas vaginalis 09/15/2014   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    ONSET DATE: 08/22/2023  REFERRING DIAG: G71.11 (ICD-10-CM) - Myotonic dystrophy, type 1 (HCC)  THERAPY DIAG:  Muscle weakness (generalized)  Unsteadiness on feet  Other abnormalities of gait and mobility  Rationale for Evaluation and Treatment: Rehabilitation  SUBJECTIVE:                                                                                                                                                                                              SUBJECTIVE STATEMENT: Pt wearing her portable O2 to clinic, SpO2 93% at rest, was on 3L O2 but turns it off at start of PT session.  Pt denies any falls or acute changes since last visit, denies any pain today.  Pt accompanied by: self  PERTINENT HISTORY: Starting around the age of 29, she started having lock jaw and stiffness of the hands.  Over the years, she developed worsening stiffness of her fingers and hands.  she ultimately was genetically tested at the age of 10 which confirmed the diagnosis of myotonic dystrophy type 1. She has a strong family history of DM1 including maternal aunt, cousins x 2, and younger sister.  She does not have any children and has no future plans for pregnancy.   Symptoms were relatively stable until her mid-30s and then started developing fatigue, weakness, daytime sleepiness, and shortness of breath with exertion.  She walks independently but was told previously told to use leg braces.  Over the past few years, she noticed intermittent difficulty swallowing liquids. She underwent barium swallow which showed signs of aspiration and she was recommended to use a straw and use chin tuck position.     She was seeing several neurologists over the years at [address] in [place].  She is not working and has been on disability since 2010.  PAIN:  Are you having pain? Yes: NPRS scale: 8/10 Pain location: GI tube insertion Pain description: achy Aggravating factors: none Relieving factors: none  PRECAUTIONS: Fall, ICD/Pacemaker, and Other: G-Tube  RED FLAGS: None   WEIGHT BEARING RESTRICTIONS: No  FALLS: Has patient fallen in last 6 months? No  LIVING ENVIRONMENT: Lives with:  cousin Lives in: House/apartment Stairs: Yes: Internal: 12 steps; on right going up Has following  equipment at home: Dan Humphreys - 4 wheeled  PLOF: Needs assistance with ADLs and Needs assistance with homemaking  PATIENT GOALS: improve  walking.   OBJECTIVE:  Note: Objective measures were completed at Evaluation unless otherwise noted. COGNITION: Overall cognitive status: Within functional limits for tasks assessed  LOWER EXTREMITY ROM:     Active  Right Eval Left Eval  Hip flexion    Hip extension    Hip abduction    Hip adduction    Hip internal rotation    Hip external rotation    Knee flexion    Knee extension    Ankle dorsiflexion    Ankle plantarflexion    Ankle inversion    Ankle eversion     (Blank rows = not tested)  LOWER EXTREMITY MMT:    MMT Right Eval Left Eval  Hip flexion    Hip extension    Hip abduction    Hip adduction    Hip internal rotation    Hip external rotation    Knee flexion    Knee extension    Ankle dorsiflexion    Ankle plantarflexion    Ankle inversion    Ankle eversion    (Blank rows = not tested)  BED MOBILITY:  Sit to supine Complete Independence Supine to sit Complete Independence Rolling to Right Complete Independence Rolling to Left Complete Independence  TRANSFERS: Assistive device utilized: Environmental consultant - 4 wheeled  Sit to stand: Modified independence Stand to sit: Modified independence Chair to chair: Modified independence  GAIT: Gait pattern: decreased arm swing- Right, decreased arm swing- Left, decreased step length- Right, decreased step length- Left, Right foot flat, Left foot flat, knee flexed in stance- Right, knee flexed in stance- Left, decreased trunk rotation, trunk flexed, poor foot clearance- Right, and poor foot clearance- Left Distance walked: 530' Assistive device utilized: Environmental consultant - 4 wheeled Level of assistance: SBA   FUNCTIONAL TESTS:  5 times sit to stand: 13 sec no UE support 6 minute walk test: SpO2 before walk was 96% 530 feet with RW and one standing break. SpO2 during break wa 98%, pt on 3L  of O2.  Ankle AROM: (09/14/23)  Dorsiflexion: R = 10 deg; L = lacks 8 deg to neutral with increased supination noted in L foot.  Plantarflexion: R = 40 deg; L  = 20 deg                                                                                                                              TREATMENT:  TherEx SciFit multi-peaks level 1 for 5 minutes using BUE/BLEs for neural priming for reciprocal movement, dynamic cardiovascular warmup and increased amplitude of stepping. RPE of 1/10 following activity. Pt on room air during exercise, SpO2 94%.  Sit to stands with 4# weighted ball chest press 3 x 10 reps  Standing palloff press with blue bungee 3 x 10 reps from L/R side each  Resisted gait in // bars with no UE support with blue bungee: Forwards 6 x 10 ft Backwards 4 x 10 ft L/R 3 x 10 ft  each direction  Attempted to assess SpO2 again during session, unable to obtain a reading even with use of NBP-40.   PATIENT EDUCATION: Education details: continue HEP Person educated: Patient Education method: Medical illustrator Education comprehension: verbalized understanding, returned demonstration, and needs further education  HOME EXERCISE PROGRAM: Access Code: [email or web address] URL: [email or web address] Date: 09/14/2023 Prepared by: Lavone Nian  Exercises - Supine March  - 1 x daily - 7 x weekly - 2 sets - 10 reps - Bilateral Bent Leg Lift  - 1 x daily - 7 x weekly - 2 sets - 10 reps - Supine Straight Leg Raises  - 1 x daily - 7 x weekly - 2 sets - 10 reps - Supine Bridge  - 1 x daily - 7 x weekly - 2 sets - 10 reps - Sidelying Hip Abduction  - 1 x daily - 7 x weekly - 2 sets - 10 reps - Seated Heel Toe Raises  - 1 x daily - 7 x weekly - 2 sets - 10 reps - Seated Heel Raise  - 1 x daily - 7 x weekly - 2 sets - 10 reps - Modified Thomas Stretch  - 1 x daily - 7 x weekly - 1 sets - 3-5 reps - 30 sec hold - Standing Gastroc Stretch at Counter  - 1 x daily - 7 x  weekly - 1 sets - 3-5 reps - 30 sec hold - Standing Hip Flexor Stretch  - 1 x daily - 7 x weekly - 1 sets - 3-5 reps - 30 sec hold   GOALS: Goals reviewed with patient? Yes  SHORT TERM GOALS: Target date: 10/09/2023    Patient will be 50% compliant with Hep to self manage her symptoms.  Baseline: TBD Goal status: INITIAL     LONG TERM GOALS: Target date: 11/06/2023    Patient will demo >700' of ambulation in 6 minutes with RW to improve walking endurance.  Baseline: 530' RW (09/11/23) Goal status: INITIAL  2.  Patient will be able to perform SLS of >5 sec on R and L LE to improve static balance.  Baseline: 1 sec on L LE; 2 sec on R LE (09/11/23); >15 sec  Rand L (10/01/23) Goal status: goal met  3.  Pt will perform 5x sit to stand without UE support in <10 sec to improve overall functional strength.  Baseline: 13 sec without UE support (09/11/23) Goal status: INITIAL  4.  Pt will demo 100% compliance with HEP to self manage her symptoms . Baseline: TBD Goal status: INITIAL   ASSESSMENT:  CLINICAL IMPRESSION: Emphasis of skilled PT session on monitoring SpO2 at rest and with activity as able as well as working on core strengthening and functional LE strengthening. Pt on room air during session and SpO2 stays at 94% and higher when able to assess it. Pt does require frequent seated rest breaks between activities. Pt continues to benefit from skilled PT services to work towards LTGs. Continue POC.   OBJECTIVE IMPAIRMENTS: Abnormal gait, decreased activity tolerance, decreased balance, decreased endurance, decreased mobility, difficulty walking, decreased strength, impaired flexibility, impaired tone, impaired UE functional use, postural dysfunction, and pain.   ACTIVITY LIMITATIONS: carrying, lifting, bending, standing, squatting, stairs, transfers, and bathing  PARTICIPATION LIMITATIONS: meal prep, cleaning, laundry, driving, shopping, community activity, and yard work  PERSONAL  FACTORS: Age and Time since onset of injury/illness/exacerbation are also affecting patient's functional outcome.   REHAB POTENTIAL: Good  CLINICAL DECISION MAKING: Stable/uncomplicated  EVALUATION  COMPLEXITY: Low  PLAN:  PT FREQUENCY: 2x/week  PT DURATION: 6 weeks  PLANNED INTERVENTIONS: 97164- PT Re-evaluation, 97110-Therapeutic exercises, 97530- Therapeutic activity, 97112- Neuromuscular re-education, 97535- Self Care, 16109- Manual therapy, [phone]- Gait training, Patient/Family education, Balance training, Stair training, Joint mobilization, DME instructions, Cryotherapy, and Moist heat  PLAN FOR NEXT SESSION:  Continue to work on core strength, dynamic gait and mobility, cardiopulmonary endurance, monitor SpO2 as able.      Peter Congo, PT Peter Congo, PT, DPT, CSRS   10/02/2023, 12:28 PM

## 2023-10-04 NOTE — BH Specialist Note (Signed)
 Integrated Behavioral Health Follow Up In-Person Visit  MRN: [id number] Name: Cynthia Underwood  Number of Integrated Behavioral Health Clinician visits: 2- Second Visit  Session Start time: [phone]   Session End time: 1700  Total time in minutes: 52   Types of Service: Individual psychotherapy  Interpretor:No. Interpretor Name and Language: n/a  Subjective:  Cynthia Underwood is a 53 y.o. female accompanied by  herself. Patient was referred by PCP for depression. Patient reports the following symptoms/concerns: lack of sleep,sadness, hunger Duration of problem: for about 2 years; Severity of problem: moderate  Objective: Mood: Depressed and Affect: Depressed Risk of harm to self or others: No plan to harm self or others  Life Context: Family and Social: pt lives with her cousin, who often helps her and take her to appointments. School/Work: not able to work. Self-Care: watching movies Life Changes: started oxygen, limited to doing things, feeding tub   Patient and/or Family's Strengths/Protective Factors: Concrete supports in place (healthy food, safe environments, etc.)  Goals Addressed: Patient will:  Reduce symptoms of: depression   Increase knowledge and/or ability of: coping skills and healthy habits   Demonstrate ability to: Increase healthy adjustment to current life circumstances and Increase adequate support systems for patient/family  Progress towards Goals: Revised and Ongoing  Interventions: Interventions utilized:  Supportive Counseling Standardized Assessments completed: Not Needed  Patient and/or Family Response: Met with patient in regards to follow up of her feeding structure.  Patient shared that it has been confirmed that she cannot have any solid.  Patient discusses her holiday gatherings.  Patient shared that she really enjoyed being with her family and she has started a new hobby of crocheting.  Patient Centered Plan: Patient is on the following  Treatment Plan(s): Depression  Assessment: Patient currently experiencing sadness and lack of social circle.   Patient may benefit from ongoing support from [facility] and more family and friend engagement  Plan: Follow up with behavioral health clinician on : 4 weeks Behavioral recommendations:  ongoing support from [facility] and more family and friend engagement Referral(s): Integrated [employer] (In Clinic) "From scale of 1-10, how likely are you to follow plan?": not sure   Vassie Loll, LCSWA

## 2023-10-05 ENCOUNTER — Ambulatory Visit: Payer: 59

## 2023-10-05 ENCOUNTER — Encounter: Payer: Self-pay | Admitting: Gastroenterology

## 2023-10-05 ENCOUNTER — Ambulatory Visit: Payer: 59 | Admitting: Speech Pathology

## 2023-10-05 DIAGNOSIS — R2689 Other abnormalities of gait and mobility: Secondary | ICD-10-CM | POA: Diagnosis not present

## 2023-10-05 DIAGNOSIS — R2681 Unsteadiness on feet: Secondary | ICD-10-CM | POA: Diagnosis not present

## 2023-10-05 DIAGNOSIS — R29898 Other symptoms and signs involving the musculoskeletal system: Secondary | ICD-10-CM | POA: Diagnosis not present

## 2023-10-05 DIAGNOSIS — Z9181 History of falling: Secondary | ICD-10-CM | POA: Diagnosis not present

## 2023-10-05 DIAGNOSIS — M6281 Muscle weakness (generalized): Secondary | ICD-10-CM | POA: Diagnosis not present

## 2023-10-05 DIAGNOSIS — R1310 Dysphagia, unspecified: Secondary | ICD-10-CM | POA: Diagnosis not present

## 2023-10-05 NOTE — Patient Instructions (Signed)
10 Masako Swallows (tongue behind teeth - keep it forward when you swallow) every day! (On commercial breaks)  50 effortful swallows (these can be dry swallows)   Continue holding exercise - holding up adams apple with tongue on the roof of your mouth.

## 2023-10-05 NOTE — Therapy (Signed)
OUTPATIENT SPEECH LANGUAGE PATHOLOGY TREATMENT NOTE   Patient Name: Cynthia Underwood MRN: [id number] DOB:03-22-1971, 53 y.o., female Today's Date: 10/05/2023  PCP: Hoy Register, MD REFERRING PROVIDER: Hoy Register, MD  END OF SESSION:  End of Session - 10/05/23 1022     Visit Number 9    Number of Visits 17    Date for SLP Re-Evaluation 10/15/23    SLP Start Time 0930    SLP Stop Time  1015    SLP Time Calculation (min) 45 min    Activity Tolerance Patient tolerated treatment well             Past Medical History:  Diagnosis Date   Bifascicular block 10/02/2014   Cataract    Dyspnea    Excess ear wax    Multinodular goiter    Pneumonia    Presence of permanent cardiac pacemaker    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   Sickle cell trait (HCC)    Watery eyes    Left   Weakness of both legs    Past Surgical History:  Procedure Laterality Date   BREAST BIOPSY Right    EYE SURGERY Left 2017   "related to blockage in my nose"   EYE SURGERY Right 05/2019   INSERT / REPLACE / REMOVE PACEMAKER  02/15/2017   IR GASTROSTOMY TUBE MOD SED  01/01/2023   PACEMAKER IMPLANT N/A 02/15/2017   Procedure: Pacemaker Implant;  Surgeon: Duke Salvia, MD;  Location: [facility] INVASIVE CV LAB;  Service: Cardiovascular;  Laterality: N/A;   RIGHT/LEFT HEART CATH AND CORONARY ANGIOGRAPHY N/A 11/16/2022   Procedure: RIGHT/LEFT HEART CATH AND CORONARY ANGIOGRAPHY;  Surgeon: Laurey Morale, MD;  Location: [facility] INVASIVE CV LAB;  Service: Cardiovascular;  Laterality: N/A;   THYROIDECTOMY N/A 04/21/2021   Procedure: TOTAL THYROIDECTOMY;  Surgeon: Axel Filler, MD;  Location: [facility] OR;  Service: General;  Laterality: N/A;   Patient Active Problem List   Diagnosis Date Noted   Aspiration pneumonia (HCC) 05/21/2023   S/P percutaneous endoscopic gastrostomy (PEG) tube placement (HCC) 03/13/2023   Diarrhea 01/23/2023   SIRS (systemic inflammatory  response syndrome) (HCC) 01/23/2023   Chronic respiratory failure with hypoxia (HCC) 01/23/2023   Dysphagia 01/23/2023   Chronic systolic CHF (congestive heart failure) (HCC) 01/23/2023   Acute on chronic respiratory failure with hypoxia (HCC) 12/07/2022   Tracheostomy status (HCC) 12/07/2022   Generalized anxiety disorder 12/06/2022   Acute systolic heart failure (HCC) 11/26/2022   Drug-induced torsades de pointes (HCC) 11/17/2022   Acute systolic CHF (congestive heart failure) (HCC) 11/16/2022   Acute hypoxemic respiratory failure (HCC) 11/15/2022   Acute on chronic combined systolic and diastolic CHF (congestive heart failure) (HCC) 11/15/2022   Protein-calorie malnutrition, severe 11/15/2022   Cardiac arrest (HCC) 11/14/2022   Healthcare-associated pneumonia 11/11/2022   Hypothyroidism 11/07/2021   Thyroid nodule 04/21/2021   S/P total thyroidectomy 04/21/2021   Sinus node dysfunction (HCC) 03/10/2020   Weakness of both legs    Watery eyes    Sickle cell trait (HCC)    Presence of permanent cardiac pacemaker    Excess ear wax    Depression    Anemia    Cardiac pacemaker in situ 03/01/2017   Mobitz type 2 second degree heart block 02/15/2017   Symptomatic advanced heart block 02/12/2017   SOB (shortness of breath) 10/02/2014   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   Bifascicular block 10/02/2014  Infection due to trichomonas vaginalis 09/15/2014   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   PERTINENT HISTORY: myotonic dystophy, hypothyroidism, anxiety. PEG dependence.   PAIN:  Are you having pain? No  FALLS: Has patient fallen in last 6 months?  No  LIVING ENVIRONMENT: Lives with: lives with their family Lives in: House/apartment  PLOF:  Level of assistance: Independent with ADLs, Independent with IADLs Employment: On disability  PATIENT GOALS: Ability to tolerate p.o.  OBJECTIVE:  Note: Objective measures were completed at Evaluation unless otherwise noted. OBJECTIVE:   INSTRUMENTAL SWALLOW STUDY FINDINGS (MBSS) 06/18/2023 Clinical Impression Pt continues to exhibit signifcant pharyngoesophageal dysphagia marked by reduced tongue base retraction, decreased laryngeal elevation, no epiglottic deflection and reduced anterior hyoid excursion. Majority of boluses remained in valleculae and pyriform sinuses with reduced UES opening. Thin liquid with teaspoon penetrated near cord level, cups sip thin penetrated after the swallow (PAS 3), nectar thick penetrated from residue and puree while performing a chin tuck spilled into airway (PAS 3). Pt did throat clear several times throughout study, however overall sensation was reduced. She demonstrated significant difficulty initiating a second swallow stating that her mouth was dry in attempts to clear residue. A left and right head turn and dry swallows were not effective. Esophageal scan revealed stasis in distal esophagus. Recommend pt continue NPO status and use PEG for nutrition. She may have sips of thin water (only) after oral care, however pt states that she does not like water. She also states that she gets hungry despite her tube feedings. Pt and cousin report she has not been having home health ST and SLP recommends home health ST to work on pharyngeal strength.  COGNITION: Overall cognitive status: Within functional limits for tasks  assessed  SUBJECTIVE DYSPHAGIA REPORTS:  Date of onset: March 2024 Reported symptoms: coughing with both solids and liquids, globus sensation, xerostomia, shortness of breath with PO, and weak voice  Current diet:  PEG dependent, thin liquid (water) PO  Co-morbid voice changes: Yes  Weight Loss: 20 lbs   FACTORS WHICH MAY INCREASE RISK OF ADVERSE EVENT IN PRESENCE OF ASPIRATION:  General health: frail or deconditioned  Risk factors: reduced respiratory function, weak cough, GERD or other GI disease, and tube present (PEG)     ORAL MOTOR EXAMINATION: Overall status: Impaired: overall reduced strength evidenced   CLINICAL SWALLOW ASSESSMENT:   Dentition: dentures (upper), some natural dentition on bottom -- eats without teeth  Vocal quality at baseline: hoarse, low vocal intensity, and vocal fatigue Patient directly observed with POs: Yes: thin liquids  Feeding: able to feed self Liquids provided by: teaspoon and cup Yale Swallow Protocol: Pass Oral phase signs and symptoms: anterior loss/spillage Pharyngeal phase signs and symptoms: multiple swallows, wet vocal quality, delayed throat clear, and delayed cough  PATIENT REPORTED OUTCOME MEASURES (PROM): deferred   TODAY'S TREATMENT:     10/05/23: Pt reported difficulty completing masako swallows and 50x effortful swallows each day d/t stomach pains from drinking water. SLP reminded pt effortful swallows can be completed  dry with out taking sips, and pt agreed to completing sufficient practice at home. SLP facilitated Shaker exercise to target increased strength/control of pharyngeal muscles and pt participated successfully. Pt demonstrated increased strength performing the exercise with min-A and held positioning for the technique for ~15 seconds without support from SLP.    10/02/23: Discussed swallow HEP, in which pt reports completing effortful swallows at home. Not completing specific number per patient report; reportedly completing  effortful swallows with sips of water throughout day. Recommended aiming for effortful swallows 50x/day at minimum. Reviewed importance of maximal effort to achieve greatest benefit during effortful swallows. With additional review of HEP in patient instructions, pt endorsed completing Mendelsohn and CTAR. Denied difficulty or need for review. Introduced Masako swallow today to address reduced BOT retraction. Modified exercise to elevate tongue tip up versus protrusion out d/t difficulty demonstrated. Pt completed effortful swallows x10 with tsp sips of water and modified masakos x7. Intermittent delayed coughing noted; cued strong cough to clear. Updated HEP to include 10 Masako swallows per day. Denied additional questions.   PATIENT EDUCATION: Education details: see above Person educated: Patient and Caregiver cousin Education method: Explanation, Demonstration, Verbal cues, and Handouts Education comprehension: verbalized understanding, returned demonstration, and needs further education   ASSESSMENT:  CLINICAL IMPRESSION: Patient is a 53 y.o. F who was seen today for dysphagia treatment session. Pt mostly compliant with HEP. Evidencing mod-I for prescribed exercises during therapy session. Reduced insight into deficits evidenced. Continues to benefit from skilled ST to address dysphagia to improve QoL.    OBJECTIVE IMPAIRMENTS: include dysphagia. These impairments are limiting patient from safety when swallowing. Factors affecting potential to achieve goals and functional outcome are severity of impairments. Patient will benefit from skilled SLP services to address above impairments and improve overall function.  REHAB POTENTIAL: Good   GOALS: Goals reviewed with patient? Yes  SHORT TERM GOALS: Target date: 09/14/2023   Pt will report daily HEP completion over 1 week period  Baseline: Goal status: MET  2.  Pt will accurately demonstrate dysphagia exercises with min-A Baseline:   Goal status: MET  3.  Pt will increase oral care BID over one week period Baseline:  Goal status: MET   LONG TERM GOALS: Target date: 10/12/2023  Pt will be independent with dysphagia HEP Baseline:  Goal status: IN PROGRESS  2.  Pt will complete MBSS  Baseline:  Goal status: IN PROGRESS  3.  Pt will utilize swallow compensations and strategies to support PO consumption of least restrictive diet based on function demonstrated on repeat instrumental swallow study Baseline:  Goal status: IN PROGRESS    PLAN:  SLP FREQUENCY: 2x/week  SLP DURATION: 8 weeks  PLANNED INTERVENTIONS: 92526 Treatment of swallowing function, Re-evaluation, Aspiration precaution training, Pharyngeal strengthening exercises, Diet toleration management , Trials of upgraded texture/liquids, SLP instruction and feedback, Compensatory strategies, and Patient/family education    Lott, Student-SLP 10/05/2023, 10:23 AM

## 2023-10-09 ENCOUNTER — Ambulatory Visit: Payer: 59

## 2023-10-09 ENCOUNTER — Encounter: Payer: 59 | Admitting: Speech Pathology

## 2023-10-11 ENCOUNTER — Ambulatory Visit: Payer: 59

## 2023-10-12 ENCOUNTER — Ambulatory Visit: Payer: 59 | Attending: Family Medicine | Admitting: Speech Pathology

## 2023-10-12 ENCOUNTER — Ambulatory Visit: Payer: 59 | Admitting: Physical Therapy

## 2023-10-12 DIAGNOSIS — Z9181 History of falling: Secondary | ICD-10-CM

## 2023-10-12 DIAGNOSIS — R29898 Other symptoms and signs involving the musculoskeletal system: Secondary | ICD-10-CM

## 2023-10-12 DIAGNOSIS — R1310 Dysphagia, unspecified: Secondary | ICD-10-CM | POA: Diagnosis not present

## 2023-10-12 DIAGNOSIS — R2681 Unsteadiness on feet: Secondary | ICD-10-CM | POA: Insufficient documentation

## 2023-10-12 DIAGNOSIS — M6281 Muscle weakness (generalized): Secondary | ICD-10-CM | POA: Insufficient documentation

## 2023-10-12 DIAGNOSIS — R2689 Other abnormalities of gait and mobility: Secondary | ICD-10-CM | POA: Diagnosis not present

## 2023-10-12 NOTE — Therapy (Signed)
 OUTPATIENT PHYSICAL THERAPY NEURO TREATMENT   Patient Name: Cynthia Underwood MRN: [id number] DOB:04-11-1971, 53 y.o., female Today's Date: 10/12/2023   PCP:Dr. Delbert REFERRING PROVIDER: Delbert Clam, MD  END OF SESSION:  PT End of Session - 10/12/23 0847     Visit Number 7    Number of Visits 13    Date for PT Re-Evaluation 11/06/23    Authorization Type UHC Medicare/Medicaid    PT Start Time 0845    PT Stop Time 0930    PT Time Calculation (min) 45 min    Equipment Utilized During Treatment Gait belt    Activity Tolerance Patient tolerated treatment well    Behavior During Therapy WFL for tasks assessed/performed                Past Medical History:  Diagnosis Date   Bifascicular block 10/02/2014   Cataract    Dyspnea    Excess ear wax    Multinodular goiter    Pneumonia    Presence of permanent cardiac pacemaker    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   Sickle cell trait (HCC)    Watery eyes    Left   Weakness of both legs    Past Surgical History:  Procedure Laterality Date   BREAST BIOPSY Right    EYE SURGERY Left 2017   related to blockage in my nose   EYE SURGERY Right 05/2019   INSERT / REPLACE / REMOVE PACEMAKER  02/15/2017   IR GASTROSTOMY TUBE MOD SED  01/01/2023   PACEMAKER IMPLANT N/A 02/15/2017   Procedure: Pacemaker Implant;  Surgeon: Fernande Elspeth BROCKS, MD;  Location: [facility] INVASIVE CV LAB;  Service: Cardiovascular;  Laterality: N/A;   RIGHT/LEFT HEART CATH AND CORONARY ANGIOGRAPHY N/A 11/16/2022   Procedure: RIGHT/LEFT HEART CATH AND CORONARY ANGIOGRAPHY;  Surgeon: Rolan Ezra RAMAN, MD;  Location: [facility] INVASIVE CV LAB;  Service: Cardiovascular;  Laterality: N/A;   THYROIDECTOMY N/A 04/21/2021   Procedure: TOTAL THYROIDECTOMY;  Surgeon: Rubin Calamity, MD;  Location: [facility] OR;  Service: General;  Laterality: N/A;   Patient Active Problem List   Diagnosis Date Noted   Aspiration pneumonia (HCC)  05/21/2023   S/P percutaneous endoscopic gastrostomy (PEG) tube placement (HCC) 03/13/2023   Diarrhea 01/23/2023   SIRS (systemic inflammatory response syndrome) (HCC) 01/23/2023   Chronic respiratory failure with hypoxia (HCC) 01/23/2023   Dysphagia 01/23/2023   Chronic systolic CHF (congestive heart failure) (HCC) 01/23/2023   Acute on chronic respiratory failure with hypoxia (HCC) 12/07/2022   Tracheostomy status (HCC) 12/07/2022   Generalized anxiety disorder 12/06/2022   Acute systolic heart failure (HCC) 11/26/2022   Drug-induced torsades de pointes (HCC) 11/17/2022   Acute systolic CHF (congestive heart failure) (HCC) 11/16/2022   Acute hypoxemic respiratory failure (HCC) 11/15/2022   Acute on chronic combined systolic and diastolic CHF (congestive heart failure) (HCC) 11/15/2022   Protein-calorie malnutrition, severe 11/15/2022   Cardiac arrest (HCC) 11/14/2022   Healthcare-associated pneumonia 11/11/2022   Hypothyroidism 11/07/2021   Thyroid  nodule 04/21/2021   S/P total thyroidectomy 04/21/2021   Sinus node dysfunction (HCC) 03/10/2020   Weakness of both legs    Watery eyes    Sickle cell trait (HCC)    Presence of permanent cardiac pacemaker    Excess ear wax    Depression    Anemia    Cardiac pacemaker in situ 03/01/2017   Mobitz type 2 second degree heart block 02/15/2017   Symptomatic advanced heart block 02/12/2017  SOB (shortness of breath) 10/02/2014   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   Bifascicular block 10/02/2014   Infection due to trichomonas vaginalis 09/15/2014   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    ONSET DATE: 08/22/2023  REFERRING DIAG: G71.11 (ICD-10-CM) - Myotonic dystrophy, type 1 (HCC)  THERAPY DIAG:  Muscle weakness (generalized)  Unsteadiness on feet  Other abnormalities of gait and mobility  History of falling  Other symptoms and signs involving the musculoskeletal system  Rationale  for Evaluation and Treatment: Rehabilitation  SUBJECTIVE:                                                                                                                                                                                             SUBJECTIVE STATEMENT: Pt not wearing her portable O2 today, reports she forgot it at home. Pt reports she fell in the bathroom last Saturday, not sure how it happened, got scraped up but no other injuries, unsure if she hit her head, was able to get back up herself. Denies any pain today.  Pt reports her HEP is going alright, no questions over it.   Pt accompanied by: self  PERTINENT HISTORY: Starting around the age of 49, she started having lock jaw and stiffness of the hands.  Over the years, she developed worsening stiffness of her fingers and hands.  she ultimately was genetically tested at the age of 34 which confirmed the diagnosis of myotonic dystrophy type 1. She has a strong family history of DM1 including maternal aunt, cousins x 2, and younger sister.  She does not have any children and has no future plans for pregnancy.   Symptoms were relatively stable until her mid-30s and then started developing fatigue, weakness, daytime sleepiness, and shortness of breath with exertion.  She walks independently but was told previously told to use leg braces.  Over the past few years, she noticed intermittent difficulty swallowing liquids. She underwent barium swallow which showed signs of aspiration and she was recommended to use a straw and use chin tuck position.     She was seeing several neurologists over the years at [facility] in [place] .  She is not working and has been on disability since 2010.  PAIN:  Are you having pain? No  PRECAUTIONS: Fall, ICD/Pacemaker, and Other: G-Tube  RED FLAGS: None   WEIGHT BEARING RESTRICTIONS: No  FALLS: Has patient fallen in last 6 months? No  LIVING ENVIRONMENT: Lives with:  cousin Lives  in: House/apartment Stairs: Yes: Internal: 12 steps; on right going up Has following equipment at home: Walker - 4  wheeled  PLOF: Needs assistance with ADLs and Needs assistance with homemaking  PATIENT GOALS: improve walking.   OBJECTIVE:  Note: Objective measures were completed at Evaluation unless otherwise noted. COGNITION: Overall cognitive status: Within functional limits for tasks assessed  LOWER EXTREMITY ROM:     Active  Right Eval Left Eval  Hip flexion    Hip extension    Hip abduction    Hip adduction    Hip internal rotation    Hip external rotation    Knee flexion    Knee extension    Ankle dorsiflexion    Ankle plantarflexion    Ankle inversion    Ankle eversion     (Blank rows = not tested)  LOWER EXTREMITY MMT:    MMT Right Eval Left Eval  Hip flexion    Hip extension    Hip abduction    Hip adduction    Hip internal rotation    Hip external rotation    Knee flexion    Knee extension    Ankle dorsiflexion    Ankle plantarflexion    Ankle inversion    Ankle eversion    (Blank rows = not tested)  BED MOBILITY:  Sit to supine Complete Independence Supine to sit Complete Independence Rolling to Right Complete Independence Rolling to Left Complete Independence  TRANSFERS: Assistive device utilized: Environmental Consultant - 4 wheeled  Sit to stand: Modified independence Stand to sit: Modified independence Chair to chair: Modified independence  GAIT: Gait pattern: decreased arm swing- Right, decreased arm swing- Left, decreased step length- Right, decreased step length- Left, Right foot flat, Left foot flat, knee flexed in stance- Right, knee flexed in stance- Left, decreased trunk rotation, trunk flexed, poor foot clearance- Right, and poor foot clearance- Left Distance walked: 530' Assistive device utilized: Environmental Consultant - 4 wheeled Level of assistance: SBA   FUNCTIONAL TESTS:  5 times sit to stand: 13 sec no UE support 6 minute walk test: SpO2 before  walk was 96% 530 feet with RW and one standing break. SpO2 during break wa 98%, pt on 3L of O2.  Ankle AROM: (09/14/23)  Dorsiflexion: R = 10 deg; L = lacks 8 deg to neutral with increased supination noted in L foot.  Plantarflexion: R = 40 deg; L  = 20 deg                                                                                                                              TREATMENT:  TherAct  SpO2 98% at rest on room air.  SciFit multi-peaks level 3 for 5 minutes using BUE/BLEs for neural priming for reciprocal movement, dynamic cardiovascular warmup and increased amplitude of stepping. RPE of 1/10 following activity. Pt on room air during exercise, SpO2 93%.  Circuits: Resisted sit to stands with green bungee 3 x 10 reps Standing alt L/R 12 step taps with BUE support at bottom of stairs 3 x 10 reps B Gait  across compliant surface in // bars with alt L/R gumdrop taps no UE support 3 x 10 ft Decreased balance/stability noted with onset of fatigue, SBA to CGA for balance  Circuit of static stance on airex with no UE support: 2# weighted dowel chest press 3 x 10 reps 2# weighted dowel OH lift 3 x 10 reps Standing alt L/R gumdrop taps 3 x 5 reps B  SpO2 98% following circuit activities.   PATIENT EDUCATION: Education details: continue HEP Person educated: Patient Education method: Medical Illustrator Education comprehension: verbalized understanding, returned demonstration, and needs further education  HOME EXERCISE PROGRAM: Access Code: [email or web address] URL: [email or web address] Date: 09/14/2023 Prepared by: Raj Blanch  Exercises - Supine March  - 1 x daily - 7 x weekly - 2 sets - 10 reps - Bilateral Bent Leg Lift  - 1 x daily - 7 x weekly - 2 sets - 10 reps - Supine Straight Leg Raises  - 1 x daily - 7 x weekly - 2 sets - 10 reps - Supine Bridge  - 1 x daily - 7 x weekly - 2 sets - 10 reps - Sidelying Hip Abduction  - 1 x daily - 7 x weekly - 2  sets - 10 reps - Seated Heel Toe Raises  - 1 x daily - 7 x weekly - 2 sets - 10 reps - Seated Heel Raise  - 1 x daily - 7 x weekly - 2 sets - 10 reps - Modified Thomas Stretch  - 1 x daily - 7 x weekly - 1 sets - 3-5 reps - 30 sec hold - Standing Gastroc Stretch at Counter  - 1 x daily - 7 x weekly - 1 sets - 3-5 reps - 30 sec hold - Standing Hip Flexor Stretch  - 1 x daily - 7 x weekly - 1 sets - 3-5 reps - 30 sec hold   GOALS: Goals reviewed with patient? Yes  SHORT TERM GOALS: Target date: 10/09/2023    Patient will be 50% compliant with Hep to self manage her symptoms.  Baseline: TBD Goal status: MET     LONG TERM GOALS: Target date: 11/06/2023    Patient will demo >700' of ambulation in 6 minutes with RW to improve walking endurance.  Baseline: 530' RW (09/11/23) Goal status: INITIAL  2.  Patient will be able to perform SLS of >5 sec on R and L LE to improve static balance.  Baseline: 1 sec on L LE; 2 sec on R LE (09/11/23); >15 sec  Rand L (10/01/23) Goal status: goal met  3.  Pt will perform 5x sit to stand without UE support in <10 sec to improve overall functional strength.  Baseline: 13 sec without UE support (09/11/23) Goal status: INITIAL  4.  Pt will demo 100% compliance with HEP to self manage her symptoms . Baseline: TBD Goal status: INITIAL   ASSESSMENT:  CLINICAL IMPRESSION: Emphasis of skilled PT session on monitoring SpO2 at rest and with activity as able as well as working on core strengthening, functional LE strengthening, and static and dynamic balance. Pt on room air during session and SpO2 stays at 98% and higher even with activity. Pt does require frequent seated rest breaks between activities. Pt has met 1/1 STG due to being 50% compliant with her initial HEP. Pt continues to benefit from skilled PT services to work towards LTGs. Continue POC.   OBJECTIVE IMPAIRMENTS: Abnormal gait, decreased activity tolerance, decreased balance, decreased endurance,  decreased mobility,  difficulty walking, decreased strength, impaired flexibility, impaired tone, impaired UE functional use, postural dysfunction, and pain.   ACTIVITY LIMITATIONS: carrying, lifting, bending, standing, squatting, stairs, transfers, and bathing  PARTICIPATION LIMITATIONS: meal prep, cleaning, laundry, driving, shopping, community activity, and yard work  PERSONAL FACTORS: Age and Time since onset of injury/illness/exacerbation are also affecting patient's functional outcome.   REHAB POTENTIAL: Good  CLINICAL DECISION MAKING: Stable/uncomplicated  EVALUATION COMPLEXITY: Low  PLAN:  PT FREQUENCY: 2x/week  PT DURATION: 6 weeks  PLANNED INTERVENTIONS: 97164- PT Re-evaluation, 97110-Therapeutic exercises, 97530- Therapeutic activity, 97112- Neuromuscular re-education, 97535- Self Care, [id number]- Manual therapy, [phone]- Gait training, Patient/Family education, Balance training, Stair training, Joint mobilization, DME instructions, Cryotherapy, and Moist heat  PLAN FOR NEXT SESSION:  Continue to work on core strength, dynamic gait and mobility, cardiopulmonary endurance, monitor SpO2 as able. SLS, sit to stand onto airex or wedge; quadruped?     Sylvie Mifsud, PT [place]ell [place], PT, DPT, CSRS   10/12/2023, 9:31 AM

## 2023-10-12 NOTE — Therapy (Signed)
 OUTPATIENT SPEECH LANGUAGE PATHOLOGY TREATMENT NOTE   Patient Name: Cynthia Underwood MRN: [id number] DOB:Aug 28, 1971, 53 y.o., female Today's Date: 10/12/2023  PCP: Delbert Clam, MD REFERRING PROVIDER: Delbert Clam, MD  END OF SESSION:   End of Session - 10/12/23 0905     Visit Number 10    Number of Visits 17    Date for SLP Re-Evaluation 11/09/23   +4 weeks for recert   SLP Start Time 0930    SLP Stop Time  1015    SLP Time Calculation (min) 45 min    Activity Tolerance Patient tolerated treatment well             Past Medical History:  Diagnosis Date   Bifascicular block 10/02/2014   Cataract    Dyspnea    Excess ear wax    Multinodular goiter    Pneumonia    Presence of permanent cardiac pacemaker    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   Sickle cell trait (HCC)    Watery eyes    Left   Weakness of both legs    Past Surgical History:  Procedure Laterality Date   BREAST BIOPSY Right    EYE SURGERY Left 2017   related to blockage in my nose   EYE SURGERY Right 05/2019   INSERT / REPLACE / REMOVE PACEMAKER  02/15/2017   IR GASTROSTOMY TUBE MOD SED  01/01/2023   PACEMAKER IMPLANT N/A 02/15/2017   Procedure: Pacemaker Implant;  Surgeon: Fernande Elspeth BROCKS, MD;  Location: [facility] INVASIVE CV LAB;  Service: Cardiovascular;  Laterality: N/A;   RIGHT/LEFT HEART CATH AND CORONARY ANGIOGRAPHY N/A 11/16/2022   Procedure: RIGHT/LEFT HEART CATH AND CORONARY ANGIOGRAPHY;  Surgeon: Rolan Ezra RAMAN, MD;  Location: [facility] INVASIVE CV LAB;  Service: Cardiovascular;  Laterality: N/A;   THYROIDECTOMY N/A 04/21/2021   Procedure: TOTAL THYROIDECTOMY;  Surgeon: Rubin Calamity, MD;  Location: [facility] [place] OR;  Service: General;  Laterality: N/A;   Patient Active Problem List   Diagnosis Date Noted   Aspiration pneumonia (HCC) 05/21/2023   S/P percutaneous endoscopic gastrostomy (PEG) tube placement (HCC) 03/13/2023   Diarrhea 01/23/2023   SIRS  (systemic inflammatory response syndrome) (HCC) 01/23/2023   Chronic respiratory failure with hypoxia (HCC) 01/23/2023   Dysphagia 01/23/2023   Chronic systolic CHF (congestive heart failure) (HCC) 01/23/2023   Acute on chronic respiratory failure with hypoxia (HCC) 12/07/2022   Tracheostomy status (HCC) 12/07/2022   Generalized anxiety disorder 12/06/2022   Acute systolic heart failure (HCC) 11/26/2022   Drug-induced torsades de pointes (HCC) 11/17/2022   Acute systolic CHF (congestive heart failure) (HCC) 11/16/2022   Acute hypoxemic respiratory failure (HCC) 11/15/2022   Acute on chronic combined systolic and diastolic CHF (congestive heart failure) (HCC) 11/15/2022   Protein-calorie malnutrition, severe 11/15/2022   Cardiac arrest (HCC) 11/14/2022   Healthcare-associated pneumonia 11/11/2022   Hypothyroidism 11/07/2021   Thyroid  nodule 04/21/2021   S/P total thyroidectomy 04/21/2021   Sinus node dysfunction (HCC) 03/10/2020   Weakness of both legs    Watery eyes    Sickle cell trait (HCC)    Presence of permanent cardiac pacemaker    Excess ear wax    Depression    Anemia    Cardiac pacemaker in situ 03/01/2017   Mobitz type 2 second degree heart block 02/15/2017   Symptomatic advanced heart block 02/12/2017   SOB (shortness of breath) 10/02/2014   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  Bifascicular block 10/02/2014   Infection due to trichomonas vaginalis 09/15/2014   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  Rationale for Evaluation and Treatment: Rehabilitation  SUBJECTIVE:   SUBJECTIVE STATEMENT: Pt shared no practice with swallowing techniques over  the previous week due to travel.  PERTINENT HISTORY: myotonic dystophy, hypothyroidism, anxiety. PEG dependence.   PAIN:  Are you having pain? No  FALLS: Has patient fallen in last 6 months?  No  LIVING ENVIRONMENT: Lives with: lives with their family Lives in: House/apartment  PLOF:  Level of assistance: Independent with ADLs, Independent with IADLs Employment: On disability  PATIENT GOALS: Ability to tolerate p.o.  OBJECTIVE:  Note: Objective measures were completed at Evaluation unless otherwise noted. OBJECTIVE:   INSTRUMENTAL SWALLOW STUDY FINDINGS (MBSS) 06/18/2023 Clinical Impression Pt continues to exhibit signifcant pharyngoesophageal dysphagia marked by reduced tongue base retraction, decreased laryngeal elevation, no epiglottic deflection and reduced anterior hyoid excursion. Majority of boluses remained in valleculae and pyriform sinuses with reduced UES opening. Thin liquid with teaspoon penetrated near cord level, cups sip thin penetrated after the swallow (PAS 3), nectar thick penetrated from residue and puree while performing a chin tuck spilled into airway (PAS 3). Pt did throat clear several times throughout study, however overall sensation was reduced. She demonstrated significant difficulty initiating a second swallow stating that her mouth was dry in attempts to clear residue. A left and right head turn and dry swallows were not effective. Esophageal scan revealed stasis in distal esophagus. Recommend pt continue NPO status and use PEG for nutrition. She may have sips of thin water  (only) after oral care, however pt states that she does not like water . She also states that she gets hungry despite her tube feedings. Pt and cousin report she has not been having home health ST and SLP recommends home health ST to work on pharyngeal strength.  COGNITION: Overall cognitive status: Within functional limits for tasks assessed  SUBJECTIVE DYSPHAGIA REPORTS:  Date of onset:  March 2024 Reported symptoms: coughing with both solids and liquids, globus sensation, xerostomia, shortness of breath with PO, and weak voice  Current diet:  PEG dependent, thin liquid (water ) PO  Co-morbid voice changes: Yes  Weight Loss: 20 lbs   FACTORS WHICH MAY INCREASE RISK OF ADVERSE EVENT IN PRESENCE OF ASPIRATION:  General health: frail or deconditioned  Risk factors: reduced respiratory function, weak cough, GERD or other GI disease, and tube present (PEG)     ORAL MOTOR EXAMINATION: Overall status: Impaired: overall reduced strength evidenced  CLINICAL SWALLOW ASSESSMENT:   Dentition: dentures (upper), some natural dentition on bottom -- eats without teeth  Vocal quality at baseline: hoarse, low vocal intensity, and vocal fatigue Patient directly observed with POs: Yes: thin liquids  Feeding: able to feed self Liquids provided by: teaspoon and cup Yale Swallow Protocol: Pass Oral phase signs and symptoms: anterior loss/spillage Pharyngeal phase signs and symptoms: multiple swallows, wet vocal quality, delayed throat clear, and delayed cough  PATIENT REPORTED OUTCOME MEASURES (PROM): deferred   TODAY'S TREATMENT:  10/12/23: ST guided pt through Shaker exercises, pt completed 2 sets of 10 with min assist, and completed chin tuck hold for 25 seconds w/out additional support. Pt shared no practice with exercises over the  past week d/t travel and ST reminded pt of the necessity for daily practice. ST initiated practice with effortful swallows with apple sauce to target strengthened, functional swallowing with a bolus. Pt participated effectively with min/mod cues. ST then reviewed and facilitated Mendelson Maneuver technique and Masako swallow exercise. Pt completed 15 trials of Mendelson with min-A, and required additional cues for the [facility] exercise.  ST inquired with pt regarding follow up MBS to further assess swallow and progress made in therapy, pt agreed. Exercise tracker  provided for increased compliance with HEP, pt to fill out daily and bring with to next appointment.   10/05/23: Pt reported difficulty completing masako swallows and 50x effortful swallows each day d/t stomach pains from drinking water . SLP reminded pt effortful swallows can be completed dry with out taking sips, and pt agreed to completing sufficient practice at home. SLP facilitated Shaker exercise to target increased strength/control of pharyngeal muscles and pt participated successfully. Pt demonstrated increased strength performing the exercise with min-A and held positioning for the technique for ~15 seconds without support from SLP.    10/02/23: Discussed swallow HEP, in which pt reports completing effortful swallows at home. Not completing specific number per patient report; reportedly completing effortful swallows with sips of water  throughout day. Recommended aiming for effortful swallows 50x/day at minimum. Reviewed importance of maximal effort to achieve greatest benefit during effortful swallows. With additional review of HEP in patient instructions, pt endorsed completing Mendelsohn and CTAR. Denied difficulty or need for review. Introduced Masako swallow today to address reduced BOT retraction. Modified exercise to elevate tongue tip up versus protrusion out d/t difficulty demonstrated. Pt completed effortful swallows x10 with tsp sips of water  and modified masakos x7. Intermittent delayed coughing noted; cued strong cough to clear. Updated HEP to include 10 Masako swallows per day. Denied additional questions.   PATIENT EDUCATION: Education details: see above Person educated: Patient and Caregiver cousin Education method: Explanation, Demonstration, Verbal cues, and Handouts Education comprehension: verbalized understanding, returned demonstration, and needs further education   ASSESSMENT:  CLINICAL IMPRESSION: Patient is a 53 y.o. F who was seen today for dysphagia treatment session.  Pt mostly compliant with HEP. Evidencing mod-I for prescribed exercises during therapy session. Reduced insight into deficits evidenced. Continues to benefit from skilled ST to address dysphagia to improve QoL. Ordered repeat MBSS this date to determine efficacy of current regimen and updated recommendation per current function/safety.   OBJECTIVE IMPAIRMENTS: include dysphagia. These impairments are limiting patient from safety when swallowing. Factors affecting potential to achieve goals and functional outcome are severity of impairments. Patient will benefit from skilled SLP services to address above impairments and improve overall function.  REHAB POTENTIAL: Good   GOALS: Goals reviewed with patient? Yes  SHORT TERM GOALS: Target date: 09/14/2023   Pt will report daily HEP completion over 1 week period  Baseline: Goal status: MET  2.  Pt will accurately demonstrate dysphagia exercises with min-A Baseline:  Goal status: MET  3.  Pt will increase oral care BID over one week period Baseline:  Goal status: MET   LONG TERM GOALS: Target date:11/09/2023 (+4 weeks for recert)   Pt will be independent with dysphagia HEP Baseline:  Goal status: IN PROGRESS  2.  Pt will complete MBSS  Baseline:  Goal status: IN PROGRESS  3.  Pt will utilize swallow compensations and strategies to support PO consumption of least restrictive diet based on function demonstrated on repeat instrumental swallow study Baseline:  Goal status: IN  PROGRESS    PLAN:  SLP FREQUENCY: 2x/week  SLP DURATION: 8 weeks + 4 weeks for recert, 12 weeks total   PLANNED INTERVENTIONS: [id number] Treatment of swallowing function, Re-evaluation, Aspiration precaution training, Pharyngeal strengthening exercises, Diet toleration management , Trials of upgraded texture/liquids, SLP instruction and feedback, Compensatory strategies, and Patient/family education  Speech Therapy Progress Note  Dates of Reporting Period:  08/17/2023 to 10/12/2023  Objective Reports of Subjective Statement: Pt seen for x10 ST visits targeting oropharyngeal dysphagia. Pt reporting daily completion of HEP per SLP recommendation.    Objective Measurements: Currently completing effortful swallow, Mendelsohn, modified masako, Shaker, chin tuck against resistance given mod-A verbal cues and occasional min-A for head lift during Shaker.    Goal Update: see goals section  Plan: continue per POC  Reason Skilled Services are Required: for improved swallow safety and efficacy, facilitate decreased dependence on enteral feeding via PEG   Chailyn Racette, Student-SLP 10/12/2023, 7:47 AM

## 2023-10-15 NOTE — Therapy (Signed)
OUTPATIENT SPEECH LANGUAGE PATHOLOGY TREATMENT NOTE   Patient Name: Cynthia Underwood MRN: [id number] DOB:[birth year], 53 y.o., female Today's Date: 10/16/2023  PCP: Hoy Register, MD REFERRING PROVIDER: Hoy Register, MD  END OF SESSION:  End of Session - 10/16/23 1107     Visit Number 11    Number of Visits 17    Date for SLP Re-Evaluation 11/09/23    SLP Start Time 1142    SLP Stop Time  1222    SLP Time Calculation (min) 40 min    Activity Tolerance Patient tolerated treatment well             End of Session - 10/16/23 1107     Visit Number 11    Number of Visits 17    Date for SLP Re-Evaluation 11/09/23    SLP Start Time 1142    SLP Stop Time  1222    SLP Time Calculation (min) 40 min    Activity Tolerance Patient tolerated treatment well              Past Medical History:  Diagnosis Date   Bifascicular block 10/02/2014   Cataract    Dyspnea    Excess ear wax    Multinodular goiter    Pneumonia    Presence of permanent cardiac pacemaker    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   Sickle cell trait (HCC)    Watery eyes    Left   Weakness of both legs    Past Surgical History:  Procedure Laterality Date   BREAST BIOPSY Right    EYE SURGERY Left 2017   "related to blockage in my nose"   EYE SURGERY Right 05/2019   INSERT / REPLACE / REMOVE PACEMAKER  02/15/2017   IR GASTROSTOMY TUBE MOD SED  01/01/2023   PACEMAKER IMPLANT N/A 02/15/2017   Procedure: Pacemaker Implant;  Surgeon: Duke Salvia, MD;  Location: [facility] INVASIVE CV LAB;  Service: Cardiovascular;  Laterality: N/A;   RIGHT/LEFT HEART CATH AND CORONARY ANGIOGRAPHY N/A 11/16/2022   Procedure: RIGHT/LEFT HEART CATH AND CORONARY ANGIOGRAPHY;  Surgeon: Laurey Morale, MD;  Location: [facility] INVASIVE CV LAB;  Service: Cardiovascular;  Laterality: N/A;   THYROIDECTOMY N/A 04/21/2021   Procedure: TOTAL THYROIDECTOMY;  Surgeon: Axel Filler, MD;   Location: [facility] OR;  Service: General;  Laterality: N/A;   Patient Active Problem List   Diagnosis Date Noted   Aspiration pneumonia (HCC) 05/21/2023   S/P percutaneous endoscopic gastrostomy (PEG) tube placement (HCC) 03/13/2023   Diarrhea 01/23/2023   SIRS (systemic inflammatory response syndrome) (HCC) 01/23/2023   Chronic respiratory failure with hypoxia (HCC) 01/23/2023   Dysphagia 01/23/2023   Chronic systolic CHF (congestive heart failure) (HCC) 01/23/2023   Acute on chronic respiratory failure with hypoxia (HCC) 12/07/2022   Tracheostomy status (HCC) 12/07/2022   Generalized anxiety disorder 12/06/2022   Acute systolic heart failure (HCC) 11/26/2022   Drug-induced torsades de pointes (HCC) 11/17/2022   Acute systolic CHF (congestive heart failure) (HCC) 11/16/2022   Acute hypoxemic respiratory failure (HCC) 11/15/2022   Acute on chronic combined systolic and diastolic CHF (congestive heart failure) (HCC) 11/15/2022   Protein-calorie malnutrition, severe 11/15/2022   Cardiac arrest (HCC) 11/14/2022   Healthcare-associated pneumonia 11/11/2022   Hypothyroidism 11/07/2021   Thyroid nodule 04/21/2021   S/P total thyroidectomy 04/21/2021   Sinus node dysfunction (HCC) 03/10/2020   Weakness of both legs    Watery eyes    Sickle cell trait (HCC)  Presence of permanent cardiac pacemaker    Excess ear wax    Depression    Anemia    Cardiac pacemaker in situ 03/01/2017   Mobitz type 2 second degree heart block 02/15/2017   Symptomatic advanced heart block 02/12/2017   SOB (shortness of breath) 10/02/2014   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   Bifascicular block 10/02/2014   Infection due to trichomonas vaginalis 09/15/2014   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  Accompanied by: self  PERTINENT HISTORY: myotonic dystophy, hypothyroidism, anxiety. PEG dependence.   PAIN:  Are you having pain? No  FALLS: Has patient fallen in last 6 months?  No  LIVING ENVIRONMENT: Lives with: lives with their family Lives in: House/apartment  PLOF:  Level of assistance: Independent with ADLs, Independent with IADLs Employment: On disability  PATIENT GOALS: Ability to tolerate p.o.  OBJECTIVE:  Note: Objective measures were completed at Evaluation unless otherwise noted. OBJECTIVE:   INSTRUMENTAL SWALLOW STUDY FINDINGS (MBSS) 06/18/2023 Clinical Impression Pt continues to exhibit signifcant pharyngoesophageal dysphagia marked by reduced tongue base retraction, decreased laryngeal elevation, no epiglottic deflection and reduced anterior hyoid excursion. Majority of boluses remained in valleculae and pyriform sinuses with reduced UES opening. Thin liquid with teaspoon penetrated near cord level, cups sip thin penetrated after the swallow (PAS 3), nectar thick penetrated from residue and puree while performing a chin tuck spilled into airway (PAS 3). Pt did throat clear several times throughout study, however overall sensation was reduced. She demonstrated significant difficulty initiating a second swallow stating that her mouth was dry in attempts to clear residue. A left and right head turn and dry swallows were not effective. Esophageal scan revealed stasis in distal esophagus. Recommend pt continue NPO status and use PEG for nutrition. She may have sips of thin water (only) after oral care, however pt states that she does not like water. She also states that she gets hungry despite her tube feedings. Pt and cousin report  she has not been having home health ST and SLP recommends home health ST to work on pharyngeal strength.  COGNITION: Overall cognitive status: Within functional limits for tasks assessed  SUBJECTIVE DYSPHAGIA REPORTS:  Date of onset: March 2024 Reported symptoms: coughing with both solids and liquids, globus sensation, xerostomia, shortness of breath with PO, and weak voice  Current diet:  PEG dependent, thin liquid (water) PO  Co-morbid voice changes: Yes  Weight Loss: 20 lbs   FACTORS WHICH MAY INCREASE RISK OF ADVERSE EVENT IN PRESENCE OF ASPIRATION:  General health: frail or deconditioned  Risk factors: reduced respiratory function, weak cough, GERD or other GI disease, and tube present (PEG)     ORAL MOTOR EXAMINATION: Overall status: Impaired: overall reduced strength evidenced  CLINICAL SWALLOW ASSESSMENT:   Dentition: dentures (upper), some natural dentition on bottom -- eats without teeth  Vocal quality at baseline: hoarse, low vocal intensity, and vocal fatigue Patient directly observed with POs: Yes: thin liquids  Feeding: able to feed self Liquids provided by: teaspoon and cup Yale Swallow Protocol: Pass Oral phase signs and symptoms: anterior loss/spillage Pharyngeal phase  signs and symptoms: multiple swallows, wet vocal quality, delayed throat clear, and delayed cough  PATIENT REPORTED OUTCOME MEASURES (PROM): deferred   TODAY'S TREATMENT:  10-16-23: Reported difficulty with tongue tip elevation/protrusion while completing Masako. Endorsed completing 2-3 reps at a time prior to needing break. Initiated education and instruction of tongue press x20 to increase lingual strength with rare min A required. Pt reported limited pharyngeal muscle engagement during tongue press d/t wearing dentures. Recommended including tongue press into HEP. Reviewed remaining swallow exercises. Demonstrated Mendelsohn exercises x10 with extra time, effortful swallows x10 with small, single  cup sips, Masako swallows x10, CTAR isokinetic x10, and CTAR isometric 60 seconds x3. Provided MBSS scheduling number to call to schedule swallow study.   10/12/23: ST guided pt through Shaker exercises, pt completed 2 sets of 10 with min assist, and completed chin tuck hold for 25 seconds w/out additional support. Pt shared no practice with exercises over the past week d/t travel and ST reminded pt of the necessity for daily practice. ST initiated practice with effortful swallows with apple sauce to target strengthened, functional swallowing with a bolus. Pt participated effectively with min/mod cues. ST then reviewed and facilitated Mendelson Maneuver technique and Masako swallow exercise. Pt completed 15 trials of Mendelson with min-A, and required additional cues for the [facility] exercise.  ST inquired with pt regarding follow up MBS to further assess swallow and progress made in therapy, pt agreed. Exercise tracker provided for increased compliance with HEP, pt to fill out daily and bring with to next appointment.   10/05/23: Pt reported difficulty completing masako swallows and 50x effortful swallows each day d/t stomach pains from drinking water. SLP reminded pt effortful swallows can be completed dry with out taking sips, and pt agreed to completing sufficient practice at home. SLP facilitated Shaker exercise to target increased strength/control of pharyngeal muscles and pt participated successfully. Pt demonstrated increased strength performing the exercise with min-A and held positioning for the technique for ~15 seconds without support from SLP.    10/02/23: Discussed swallow HEP, in which pt reports completing effortful swallows at home. Not completing specific number per patient report; reportedly completing effortful swallows with sips of water throughout day. Recommended aiming for effortful swallows 50x/day at minimum. Reviewed importance of maximal effort to achieve greatest benefit during effortful  swallows. With additional review of HEP in patient instructions, pt endorsed completing Mendelsohn and CTAR. Denied difficulty or need for review. Introduced Masako swallow today to address reduced BOT retraction. Modified exercise to elevate tongue tip up versus protrusion out d/t difficulty demonstrated. Pt completed effortful swallows x10 with tsp sips of water and modified masakos x7. Intermittent delayed coughing noted; cued strong cough to clear. Updated HEP to include 10 Masako swallows per day. Denied additional questions.   PATIENT EDUCATION: Education details: see above Person educated: Patient and Caregiver cousin Education method: Explanation, Demonstration, Verbal cues, and Handouts Education comprehension: verbalized understanding, returned demonstration, and needs further education   ASSESSMENT:  CLINICAL IMPRESSION: Patient is a 53 y.o. F who was seen today for dysphagia treatment session. Pt mostly compliant with HEP. Evidencing mod-I for prescribed exercises during therapy session. Reduced insight into deficits evidenced. Continues to benefit from skilled ST to address dysphagia to improve QoL. Ordered repeat MBSS to determine efficacy of current regimen and updated recommendation per current function/safety.   OBJECTIVE IMPAIRMENTS: include dysphagia. These impairments are limiting patient from safety when swallowing. Factors affecting potential to achieve goals and functional outcome are severity of  impairments. Patient will benefit from skilled SLP services to address above impairments and improve overall function.  REHAB POTENTIAL: Good   GOALS: Goals reviewed with patient? Yes  SHORT TERM GOALS: Target date: 09/14/2023   Pt will report daily HEP completion over 1 week period  Baseline: Goal status: MET  2.  Pt will accurately demonstrate dysphagia exercises with min-A Baseline:  Goal status: MET  3.  Pt will increase oral care BID over one week period Baseline:   Goal status: MET   LONG TERM GOALS: Target date:11/09/2023 (+4 weeks for recert)   Pt will be independent with dysphagia HEP Baseline:  Goal status: IN PROGRESS  2.  Pt will complete MBSS  Baseline:  Goal status: IN PROGRESS  3.  Pt will utilize swallow compensations and strategies to support PO consumption of least restrictive diet based on function demonstrated on repeat instrumental swallow study Baseline:  Goal status: IN PROGRESS    PLAN:  SLP FREQUENCY: 2x/week  SLP DURATION: 8 weeks + 4 weeks for recert, 12 weeks total   PLANNED INTERVENTIONS: [id number] Treatment of swallowing function, Re-evaluation, Aspiration precaution training, Pharyngeal strengthening exercises, Diet toleration management , Trials of upgraded texture/liquids, SLP instruction and feedback, Compensatory strategies, and Patient/family education   Gracy Racer, CCC-SLP 10/16/2023, 12:22 PM

## 2023-10-16 ENCOUNTER — Ambulatory Visit: Payer: 59 | Admitting: Physical Therapy

## 2023-10-16 ENCOUNTER — Ambulatory Visit: Payer: 59

## 2023-10-16 ENCOUNTER — Other Ambulatory Visit (HOSPITAL_COMMUNITY): Payer: Self-pay | Admitting: *Deleted

## 2023-10-16 DIAGNOSIS — R2681 Unsteadiness on feet: Secondary | ICD-10-CM | POA: Diagnosis not present

## 2023-10-16 DIAGNOSIS — R2689 Other abnormalities of gait and mobility: Secondary | ICD-10-CM | POA: Diagnosis not present

## 2023-10-16 DIAGNOSIS — Z9181 History of falling: Secondary | ICD-10-CM

## 2023-10-16 DIAGNOSIS — R29898 Other symptoms and signs involving the musculoskeletal system: Secondary | ICD-10-CM | POA: Diagnosis not present

## 2023-10-16 DIAGNOSIS — R1310 Dysphagia, unspecified: Secondary | ICD-10-CM

## 2023-10-16 DIAGNOSIS — M6281 Muscle weakness (generalized): Secondary | ICD-10-CM | POA: Diagnosis not present

## 2023-10-16 NOTE — Patient Instructions (Signed)
Bring your swallow exercise list next session so we can update the repetition number if needed   Expect a call to schedule your swallow study

## 2023-10-16 NOTE — Therapy (Signed)
OUTPATIENT PHYSICAL THERAPY NEURO TREATMENT   Patient Name: Cynthia Underwood MRN: [id number] DOB:05-07-1971, 53 y.o., female Today's Date: 10/16/2023   PCP:[name] REFERRING PROVIDER: Hoy Register, MD  END OF SESSION:  PT End of Session - 10/16/23 1102     Visit Number 8    Number of Visits 13    Date for PT Re-Evaluation 11/06/23    Authorization Type UHC Medicare/Medicaid    PT Start Time 1100    PT Stop Time 1140    PT Time Calculation (min) 40 min    Equipment Utilized During Treatment Gait belt    Activity Tolerance Patient tolerated treatment well    Behavior During Therapy WFL for tasks assessed/performed                 Past Medical History:  Diagnosis Date   Bifascicular block 10/02/2014   Cataract    Dyspnea    Excess ear wax    Multinodular goiter    Pneumonia    Presence of permanent cardiac pacemaker    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   Sickle cell trait (HCC)    Watery eyes    Left   Weakness of both legs    Past Surgical History:  Procedure Laterality Date   BREAST BIOPSY Right    EYE SURGERY Left 2017   "related to blockage in my nose"   EYE SURGERY Right 05/2019   INSERT / REPLACE / REMOVE PACEMAKER  02/15/2017   IR GASTROSTOMY TUBE MOD SED  01/01/2023   PACEMAKER IMPLANT N/A 02/15/2017   Procedure: Pacemaker Implant;  Surgeon: Duke Salvia, MD;  Location: [facility] INVASIVE CV LAB;  Service: Cardiovascular;  Laterality: N/A;   RIGHT/LEFT HEART CATH AND CORONARY ANGIOGRAPHY N/A 11/16/2022   Procedure: RIGHT/LEFT HEART CATH AND CORONARY ANGIOGRAPHY;  Surgeon: Laurey Morale, MD;  Location: [facility] INVASIVE CV LAB;  Service: Cardiovascular;  Laterality: N/A;   THYROIDECTOMY N/A 04/21/2021   Procedure: TOTAL THYROIDECTOMY;  Surgeon: Axel Filler, MD;  Location: [facility] OR;  Service: General;  Laterality: N/A;   Patient Active Problem List   Diagnosis Date Noted   Aspiration pneumonia (HCC)  05/21/2023   S/P percutaneous endoscopic gastrostomy (PEG) tube placement (HCC) 03/13/2023   Diarrhea 01/23/2023   SIRS (systemic inflammatory response syndrome) (HCC) 01/23/2023   Chronic respiratory failure with hypoxia (HCC) 01/23/2023   Dysphagia 01/23/2023   Chronic systolic CHF (congestive heart failure) (HCC) 01/23/2023   Acute on chronic respiratory failure with hypoxia (HCC) 12/07/2022   Tracheostomy status (HCC) 12/07/2022   Generalized anxiety disorder 12/06/2022   Acute systolic heart failure (HCC) 11/26/2022   Drug-induced torsades de pointes (HCC) 11/17/2022   Acute systolic CHF (congestive heart failure) (HCC) 11/16/2022   Acute hypoxemic respiratory failure (HCC) 11/15/2022   Acute on chronic combined systolic and diastolic CHF (congestive heart failure) (HCC) 11/15/2022   Protein-calorie malnutrition, severe 11/15/2022   Cardiac arrest (HCC) 11/14/2022   Healthcare-associated pneumonia 11/11/2022   Hypothyroidism 11/07/2021   Thyroid nodule 04/21/2021   S/P total thyroidectomy 04/21/2021   Sinus node dysfunction (HCC) 03/10/2020   Weakness of both legs    Watery eyes    Sickle cell trait (HCC)    Presence of permanent cardiac pacemaker    Excess ear wax    Depression    Anemia    Cardiac pacemaker in situ 03/01/2017   Mobitz type 2 second degree heart block 02/15/2017   Symptomatic advanced heart block  02/12/2017   SOB (shortness of breath) 10/02/2014   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   Bifascicular block 10/02/2014   Infection due to trichomonas vaginalis 09/15/2014   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    ONSET DATE: 08/22/2023  REFERRING DIAG: G71.11 (ICD-10-CM) - Myotonic dystrophy, type 1 (HCC)  THERAPY DIAG:  Muscle weakness (generalized)  Unsteadiness on feet  Other abnormalities of gait and mobility  History of falling  Rationale for Evaluation and Treatment: Rehabilitation  SUBJECTIVE:                                                                                                                                                                                              SUBJECTIVE STATEMENT: Pt reports this week has been "a lot", having to pack up and move by the end of this week unexpectedly. Pt reports feeling just tired, no pain. Pt denies any falls since last visit. Pt was tired after last visit but it wasn't too bad.  Pt wearing her portable O2 today, on 3L O2 at rest.   Pt accompanied by: self  PERTINENT HISTORY: Starting around the age of 38, she started having lock jaw and stiffness of the hands.  Over the years, she developed worsening stiffness of her fingers and hands.  she ultimately was genetically tested at the age of 51 which confirmed the diagnosis of myotonic dystrophy type 1. She has a strong family history of DM1 including maternal aunt, cousins x 2, and younger sister.  She does not have any children and has no future plans for pregnancy.   Symptoms were relatively stable until her mid-30s and then started developing fatigue, weakness, daytime sleepiness, and shortness of breath with exertion.  She walks independently but was told previously told to use leg braces.  Over the past few years, she noticed intermittent difficulty swallowing liquids. She underwent barium swallow which showed signs of aspiration and she was recommended to use a straw and use chin tuck position.     She was seeing several neurologists over the years at [address] in [place].  She is not working and has been on disability since 2010.  PAIN:  Are you having pain? No  PRECAUTIONS: Fall, ICD/Pacemaker, and Other: G-Tube  RED FLAGS: None   WEIGHT BEARING RESTRICTIONS: No  FALLS: Has patient fallen in last 6 months? No  LIVING ENVIRONMENT: Lives with:  cousin Lives in: House/apartment Stairs: Yes: Internal: 12 steps; on right going up Has following equipment at home:  Walker - 4 wheeled  PLOF: Needs assistance with ADLs and Needs assistance with  homemaking  PATIENT GOALS: improve walking.   OBJECTIVE:  Note: Objective measures were completed at Evaluation unless otherwise noted. COGNITION: Overall cognitive status: Within functional limits for tasks assessed  LOWER EXTREMITY ROM:     Active  Right Eval Left Eval  Hip flexion    Hip extension    Hip abduction    Hip adduction    Hip internal rotation    Hip external rotation    Knee flexion    Knee extension    Ankle dorsiflexion    Ankle plantarflexion    Ankle inversion    Ankle eversion     (Blank rows = not tested)  LOWER EXTREMITY MMT:    MMT Right Eval Left Eval  Hip flexion    Hip extension    Hip abduction    Hip adduction    Hip internal rotation    Hip external rotation    Knee flexion    Knee extension    Ankle dorsiflexion    Ankle plantarflexion    Ankle inversion    Ankle eversion    (Blank rows = not tested)  BED MOBILITY:  Sit to supine Complete Independence Supine to sit Complete Independence Rolling to Right Complete Independence Rolling to Left Complete Independence  TRANSFERS: Assistive device utilized: Environmental consultant - 4 wheeled  Sit to stand: Modified independence Stand to sit: Modified independence Chair to chair: Modified independence  GAIT: Gait pattern: decreased arm swing- Right, decreased arm swing- Left, decreased step length- Right, decreased step length- Left, Right foot flat, Left foot flat, knee flexed in stance- Right, knee flexed in stance- Left, decreased trunk rotation, trunk flexed, poor foot clearance- Right, and poor foot clearance- Left Distance walked: 530' Assistive device utilized: Environmental consultant - 4 wheeled Level of assistance: SBA   FUNCTIONAL TESTS:  5 times sit to stand: 13 sec no UE support 6 minute walk test: SpO2 before walk was 96% 530 feet with RW and one standing break. SpO2 during break wa 98%, pt on 3L of O2.  Ankle  AROM: (09/14/23)  Dorsiflexion: R = 10 deg; L = lacks 8 deg to neutral with increased supination noted in L foot.  Plantarflexion: R = 40 deg; L  = 20 deg                                                                                                                              TREATMENT:  TherAct  SpO2 100% at rest on 3L O2. Pt removes her O2 during PT session by choice, see vitals below.  SciFit multi-peaks level 5 for 8 minutes using BUE/BLEs for neural priming for reciprocal movement, dynamic cardiovascular warmup and increased amplitude of stepping. RPE of 3/10 following activity. Pt on room air during exercise, SpO2 96%.  For functional UE strengthening: Seated 5# weighted dowel rod volleyball x 15 reps Progression to standing 5# weighted dowel rod volleyball 3 x 15 reps  NMR In quadruped on  mat table with CGA to min A for balance: Alt UE lifts x 10 reps B Alt LE lifts x 10 reps B  In various kneeling positions on red mat on floor: Tall kneel mini-squats x 10 reps, difficulty achieving full ROM due to hip flexor and quad tightness Half kneel balance 3 x 30 sec each L/R with hands on thighs and CGA  SpO2 assessed via session, remains at 96% and higher on room air during session.  PATIENT EDUCATION: Education details: continue HEP especially LE stretches due to ongoing hip tightness Person educated: Patient Education method: Medical illustrator Education comprehension: verbalized understanding, returned demonstration, and needs further education  HOME EXERCISE PROGRAM: Access Code: [email or web address] URL: [email or web address] Date: 09/14/2023 Prepared by: Lavone Nian  Exercises - Supine March  - 1 x daily - 7 x weekly - 2 sets - 10 reps - Bilateral Bent Leg Lift  - 1 x daily - 7 x weekly - 2 sets - 10 reps - Supine Straight Leg Raises  - 1 x daily - 7 x weekly - 2 sets - 10 reps - Supine Bridge  - 1 x daily - 7 x weekly - 2 sets - 10 reps - Sidelying  Hip Abduction  - 1 x daily - 7 x weekly - 2 sets - 10 reps - Seated Heel Toe Raises  - 1 x daily - 7 x weekly - 2 sets - 10 reps - Seated Heel Raise  - 1 x daily - 7 x weekly - 2 sets - 10 reps - Modified Thomas Stretch  - 1 x daily - 7 x weekly - 1 sets - 3-5 reps - 30 sec hold - Standing Gastroc Stretch at Counter  - 1 x daily - 7 x weekly - 1 sets - 3-5 reps - 30 sec hold - Standing Hip Flexor Stretch  - 1 x daily - 7 x weekly - 1 sets - 3-5 reps - 30 sec hold   GOALS: Goals reviewed with patient? Yes  SHORT TERM GOALS: Target date: 10/09/2023    Patient will be 50% compliant with Hep to self manage her symptoms.  Baseline: TBD Goal status: MET     LONG TERM GOALS: Target date: 11/06/2023    Patient will demo >700' of ambulation in 6 minutes with RW to improve walking endurance.  Baseline: 530' RW (09/11/23) Goal status: INITIAL  2.  Patient will be able to perform SLS of >5 sec on R and L LE to improve static balance.  Baseline: 1 sec on L LE; 2 sec on R LE (09/11/23); >15 sec  Rand L (10/01/23) Goal status: goal met  3.  Pt will perform 5x sit to stand without UE support in <10 sec to improve overall functional strength.  Baseline: 13 sec without UE support (09/11/23) Goal status: INITIAL  4.  Pt will demo 100% compliance with HEP to self manage her symptoms . Baseline: TBD Goal status: INITIAL   ASSESSMENT:  CLINICAL IMPRESSION: Emphasis of skilled PT session on monitoring SpO2 at rest and with activity as well as working on functional UE and LE strengthening. Pt on room air during session and SpO2 stays at 96% and higher even with activity. Pt does require frequent seated rest breaks between activities. Pt continues to benefit from skilled PT services to work towards LTGs. Continue POC.   OBJECTIVE IMPAIRMENTS: Abnormal gait, decreased activity tolerance, decreased balance, decreased endurance, decreased mobility, difficulty walking, decreased strength, impaired  flexibility, impaired tone, impaired  UE functional use, postural dysfunction, and pain.   ACTIVITY LIMITATIONS: carrying, lifting, bending, standing, squatting, stairs, transfers, and bathing  PARTICIPATION LIMITATIONS: meal prep, cleaning, laundry, driving, shopping, community activity, and yard work  PERSONAL FACTORS: Age and Time since onset of injury/illness/exacerbation are also affecting patient's functional outcome.   REHAB POTENTIAL: Good  CLINICAL DECISION MAKING: Stable/uncomplicated  EVALUATION COMPLEXITY: Low  PLAN:  PT FREQUENCY: 2x/week  PT DURATION: 6 weeks  PLANNED INTERVENTIONS: [id number]- PT Re-evaluation, 97110-Therapeutic exercises, 97530- Therapeutic activity, 97112- Neuromuscular re-education, 97535- Self Care, [id number]- Manual therapy, [phone]- Gait training, Patient/Family education, Balance training, Stair training, Joint mobilization, DME instructions, Cryotherapy, and Moist heat  PLAN FOR NEXT SESSION:  Continue to work on core strength, dynamic gait and mobility, cardiopulmonary endurance, monitor SpO2 as able. SLS, sit to stand onto airex or wedge; quadruped if tolerated?     Peter Congo, PT Peter Congo, PT, DPT, CSRS   10/16/2023, 11:44 AM

## 2023-10-18 ENCOUNTER — Ambulatory Visit: Payer: 59

## 2023-10-18 DIAGNOSIS — R1310 Dysphagia, unspecified: Secondary | ICD-10-CM | POA: Diagnosis not present

## 2023-10-18 DIAGNOSIS — M6281 Muscle weakness (generalized): Secondary | ICD-10-CM

## 2023-10-18 DIAGNOSIS — R29898 Other symptoms and signs involving the musculoskeletal system: Secondary | ICD-10-CM | POA: Diagnosis not present

## 2023-10-18 DIAGNOSIS — Z9181 History of falling: Secondary | ICD-10-CM

## 2023-10-18 DIAGNOSIS — R2689 Other abnormalities of gait and mobility: Secondary | ICD-10-CM | POA: Diagnosis not present

## 2023-10-18 DIAGNOSIS — R2681 Unsteadiness on feet: Secondary | ICD-10-CM | POA: Diagnosis not present

## 2023-10-18 NOTE — Therapy (Signed)
OUTPATIENT PHYSICAL THERAPY NEURO TREATMENT   Patient Name: Cynthia Underwood MRN: [id number] DOB:Jul 30, 1971, 53 y.o., female Today's Date: 10/18/2023   PCP:[name] REFERRING PROVIDER: Hoy Register, MD  END OF SESSION:  PT End of Session - 10/18/23 1102     Visit Number 9    Number of Visits 13    Date for PT Re-Evaluation 11/06/23    Authorization Type UHC Medicare/Medicaid    PT Start Time 1105    PT Stop Time 1150    PT Time Calculation (min) 45 min    Equipment Utilized During Treatment Gait belt    Activity Tolerance Patient tolerated treatment well    Behavior During Therapy WFL for tasks assessed/performed                 Past Medical History:  Diagnosis Date   Bifascicular block 10/02/2014   Cataract    Dyspnea    Excess ear wax    Multinodular goiter    Pneumonia    Presence of permanent cardiac pacemaker    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   Sickle cell trait (HCC)    Watery eyes    Left   Weakness of both legs    Past Surgical History:  Procedure Laterality Date   BREAST BIOPSY Right    EYE SURGERY Left 2017   "related to blockage in my nose"   EYE SURGERY Right 05/2019   INSERT / REPLACE / REMOVE PACEMAKER  02/15/2017   IR GASTROSTOMY TUBE MOD SED  01/01/2023   PACEMAKER IMPLANT N/A 02/15/2017   Procedure: Pacemaker Implant;  Surgeon: Duke Salvia, MD;  Location: [facility] INVASIVE CV LAB;  Service: Cardiovascular;  Laterality: N/A;   RIGHT/LEFT HEART CATH AND CORONARY ANGIOGRAPHY N/A 11/16/2022   Procedure: RIGHT/LEFT HEART CATH AND CORONARY ANGIOGRAPHY;  Surgeon: Laurey Morale, MD;  Location: [facility] INVASIVE CV LAB;  Service: Cardiovascular;  Laterality: N/A;   THYROIDECTOMY N/A 04/21/2021   Procedure: TOTAL THYROIDECTOMY;  Surgeon: Axel Filler, MD;  Location: [facility] OR;  Service: General;  Laterality: N/A;   Patient Active Problem List   Diagnosis Date Noted   Aspiration pneumonia (HCC)  05/21/2023   S/P percutaneous endoscopic gastrostomy (PEG) tube placement (HCC) 03/13/2023   Diarrhea 01/23/2023   SIRS (systemic inflammatory response syndrome) (HCC) 01/23/2023   Chronic respiratory failure with hypoxia (HCC) 01/23/2023   Dysphagia 01/23/2023   Chronic systolic CHF (congestive heart failure) (HCC) 01/23/2023   Acute on chronic respiratory failure with hypoxia (HCC) 12/07/2022   Tracheostomy status (HCC) 12/07/2022   Generalized anxiety disorder 12/06/2022   Acute systolic heart failure (HCC) 11/26/2022   Drug-induced torsades de pointes (HCC) 11/17/2022   Acute systolic CHF (congestive heart failure) (HCC) 11/16/2022   Acute hypoxemic respiratory failure (HCC) 11/15/2022   Acute on chronic combined systolic and diastolic CHF (congestive heart failure) (HCC) 11/15/2022   Protein-calorie malnutrition, severe 11/15/2022   Cardiac arrest (HCC) 11/14/2022   Healthcare-associated pneumonia 11/11/2022   Hypothyroidism 11/07/2021   Thyroid nodule 04/21/2021   S/P total thyroidectomy 04/21/2021   Sinus node dysfunction (HCC) 03/10/2020   Weakness of both legs    Watery eyes    Sickle cell trait (HCC)    Presence of permanent cardiac pacemaker    Excess ear wax    Depression    Anemia    Cardiac pacemaker in situ 03/01/2017   Mobitz type 2 second degree heart block 02/15/2017   Symptomatic advanced heart block  02/12/2017   SOB (shortness of breath) 10/02/2014   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   Bifascicular block 10/02/2014   Infection due to trichomonas vaginalis 09/15/2014   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    ONSET DATE: 08/22/2023  REFERRING DIAG: G71.11 (ICD-10-CM) - Myotonic dystrophy, type 1 (HCC)  THERAPY DIAG:  Muscle weakness (generalized)  Other abnormalities of gait and mobility  Unsteadiness on feet  History of falling  Rationale for Evaluation and Treatment: Rehabilitation  SUBJECTIVE:                                                                                                                                                                                              SUBJECTIVE STATEMENT: Pt reports her legs were shaky after last sessio but felt fine after couple of hours. No carryover into next day.  Pt wearing her portable O2 today, on 3L O2 at rest.   Pt accompanied by: self  PERTINENT HISTORY: Starting around the age of 15, she started having lock jaw and stiffness of the hands.  Over the years, she developed worsening stiffness of her fingers and hands.  she ultimately was genetically tested at the age of 46 which confirmed the diagnosis of myotonic dystrophy type 1. She has a strong family history of DM1 including maternal aunt, cousins x 2, and younger sister.  She does not have any children and has no future plans for pregnancy.   Symptoms were relatively stable until her mid-30s and then started developing fatigue, weakness, daytime sleepiness, and shortness of breath with exertion.  She walks independently but was told previously told to use leg braces.  Over the past few years, she noticed intermittent difficulty swallowing liquids. She underwent barium swallow which showed signs of aspiration and she was recommended to use a straw and use chin tuck position.     She was seeing several neurologists over the years at [address] in [place].  She is not working and has been on disability since 2010.  PAIN:  Are you having pain? No  PRECAUTIONS: Fall, ICD/Pacemaker, and Other: G-Tube  RED FLAGS: None   WEIGHT BEARING RESTRICTIONS: No  FALLS: Has patient fallen in last 6 months? No  LIVING ENVIRONMENT: Lives with:  cousin Lives in: House/apartment Stairs: Yes: Internal: 12 steps; on right going up Has following equipment at home: Walker - 4 wheeled  PLOF: Needs assistance with ADLs and Needs assistance with homemaking  PATIENT GOALS: improve  walking.   OBJECTIVE:  Note: Objective measures were completed at Evaluation unless otherwise noted. COGNITION: Overall cognitive status: Within  functional limits for tasks assessed  LOWER EXTREMITY ROM:     Active  Right Eval Left Eval  Hip flexion    Hip extension    Hip abduction    Hip adduction    Hip internal rotation    Hip external rotation    Knee flexion    Knee extension    Ankle dorsiflexion    Ankle plantarflexion    Ankle inversion    Ankle eversion     (Blank rows = not tested)  LOWER EXTREMITY MMT:    MMT Right Eval Left Eval  Hip flexion    Hip extension    Hip abduction    Hip adduction    Hip internal rotation    Hip external rotation    Knee flexion    Knee extension    Ankle dorsiflexion    Ankle plantarflexion    Ankle inversion    Ankle eversion    (Blank rows = not tested)  BED MOBILITY:  Sit to supine Complete Independence Supine to sit Complete Independence Rolling to Right Complete Independence Rolling to Left Complete Independence  TRANSFERS: Assistive device utilized: Environmental consultant - 4 wheeled  Sit to stand: Modified independence Stand to sit: Modified independence Chair to chair: Modified independence  GAIT: Gait pattern: decreased arm swing- Right, decreased arm swing- Left, decreased step length- Right, decreased step length- Left, Right foot flat, Left foot flat, knee flexed in stance- Right, knee flexed in stance- Left, decreased trunk rotation, trunk flexed, poor foot clearance- Right, and poor foot clearance- Left Distance walked: 530' Assistive device utilized: Environmental consultant - 4 wheeled Level of assistance: SBA   FUNCTIONAL TESTS:  5 times sit to stand: 13 sec no UE support 6 minute walk test: SpO2 before walk was 96% 530 feet with RW and one standing break. SpO2 during break wa 98%, pt on 3L of O2.  Ankle AROM: (09/14/23)  Dorsiflexion: R = 10 deg; L = lacks 8 deg to neutral with increased supination noted in L foot.   Plantarflexion: R = 40 deg; L  = 20 deg                                                                                                                              TREATMENT:  TherAct  SpO2 100% at rest on 3L O2. Pt removes her O2 during PT session by choice, vitals without supplemental O2 was 94%  SciFit multi-peaks level 5 for 8 minutes using BUE/BLEs for neural priming for reciprocal movement, dynamic cardiovascular warmup and increased amplitude of stepping. RPE of 3/10 following activity. Pt on room air during exercise, SpO2 96%.  In quadruped on mat with bil UE on table: tall kneeling to squat: 2 x 10, SpO2 measured after which was at 99% with 81bpm. Pt needed sitting break  Tall kneeling across body touches to cone: 15x R and L, moved the cones further out after every 5 reps to each  side to challenge her reach, seated break afterwards Alt UE lifts x 10 reps B Alt LE lifts x 10 reps B SpO2 after was 96% Partial tandem stance with head/body turns: 10x R and L Tandem stance on floor: 30 sec R and L 4lb medicine ball overheadl lift: bil: 10x Seated 4lb ball trunk twist to tap cone on each side: 10x Sit to stand with 4lb ball 10x Standing toe touches: 5x, unable to touch toes due to decreased flexibility in hamstrings/gluts SciFit manual level 5 for 8 minutes using BUE/BLEs for neural priming for reciprocal movement, dynamic cardiovascular warmup and increased amplitude of stepping. RPE of 3/10 following activity. Pt on room air during exercise, SpO2 96%. We discussed continued compliance with HEP. We discussed bed exercises to be done when she is feeling weaker and more standing/dynamic exercises to be done when she is feeling stronger. Discussed walking 5-10 min with walker 3x/week. Discussed potentially getting new referral in 6 months after discharge (in 2 sessions) to reassess/progress if she is able to maintain her progress.   PATIENT EDUCATION: Education details: continue HEP  especially LE stretches due to ongoing hip tightness Person educated: Patient Education method: Medical illustrator Education comprehension: verbalized understanding, returned demonstration, and needs further education  HOME EXERCISE PROGRAM: Access Code: [email or web address] URL: [email or web address] Date: 09/14/2023 Prepared by: Lavone Nian  Exercises - Supine March  - 1 x daily - 7 x weekly - 2 sets - 10 reps - Bilateral Bent Leg Lift  - 1 x daily - 7 x weekly - 2 sets - 10 reps - Supine Straight Leg Raises  - 1 x daily - 7 x weekly - 2 sets - 10 reps - Supine Bridge  - 1 x daily - 7 x weekly - 2 sets - 10 reps - Sidelying Hip Abduction  - 1 x daily - 7 x weekly - 2 sets - 10 reps - Seated Heel Toe Raises  - 1 x daily - 7 x weekly - 2 sets - 10 reps - Seated Heel Raise  - 1 x daily - 7 x weekly - 2 sets - 10 reps - Modified Thomas Stretch  - 1 x daily - 7 x weekly - 1 sets - 3-5 reps - 30 sec hold - Standing Gastroc Stretch at Counter  - 1 x daily - 7 x weekly - 1 sets - 3-5 reps - 30 sec hold - Standing Hip Flexor Stretch  - 1 x daily - 7 x weekly - 1 sets - 3-5 reps - 30 sec hold   GOALS: Goals reviewed with patient? Yes  SHORT TERM GOALS: Target date: 10/09/2023    Patient will be 50% compliant with Hep to self manage her symptoms.  Baseline: pt alternates PT and SLP exercises daily Goal status: MET     LONG TERM GOALS: Target date: 11/06/2023    Patient will demo >700' of ambulation in 6 minutes with RW to improve walking endurance.  Baseline: 530' RW (09/11/23) Goal status: INITIAL  2.  Patient will be able to perform SLS of >5 sec on R and L LE to improve static balance.  Baseline: 1 sec on L LE; 2 sec on R LE (09/11/23); >15 sec  Rand L (10/01/23) Goal status: goal met  3.  Pt will perform 5x sit to stand without UE support in <10 sec to improve overall functional strength.  Baseline: 13 sec without UE support (09/11/23) Goal status: INITIAL  4.  Pt  will  demo 100% compliance with HEP to self manage her symptoms . Baseline: TBD Goal status: INITIAL   ASSESSMENT:  CLINICAL IMPRESSION: Pt is tolerating dynamic strengthening and balance exercises well without any adverse muscle aches/soreness or reduced function.    OBJECTIVE IMPAIRMENTS: Abnormal gait, decreased activity tolerance, decreased balance, decreased endurance, decreased mobility, difficulty walking, decreased strength, impaired flexibility, impaired tone, impaired UE functional use, postural dysfunction, and pain.   ACTIVITY LIMITATIONS: carrying, lifting, bending, standing, squatting, stairs, transfers, and bathing  PARTICIPATION LIMITATIONS: meal prep, cleaning, laundry, driving, shopping, community activity, and yard work  PERSONAL FACTORS: Age and Time since onset of injury/illness/exacerbation are also affecting patient's functional outcome.   REHAB POTENTIAL: Good  CLINICAL DECISION MAKING: Stable/uncomplicated  EVALUATION COMPLEXITY: Low  PLAN:  PT FREQUENCY: 2x/week  PT DURATION: 6 weeks  PLANNED INTERVENTIONS: 97164- PT Re-evaluation, 97110-Therapeutic exercises, 97530- Therapeutic activity, 97112- Neuromuscular re-education, 97535- Self Care, 16109- Manual therapy, [phone]- Gait training, Patient/Family education, Balance training, Stair training, Joint mobilization, DME instructions, Cryotherapy, and Moist heat  PLAN FOR NEXT SESSION:  POC discussed with patient on discharging in next 2 sessions with updated HEP. Reassess walking and sit to stand (LTG #1 and 3) next session; Update HEP (consider functional exercises- sit to stand, standing lumbar flexion, SLS, tandem stance, eyes closed balance activities). Continue to work on core strength, dynamic gait and mobility, cardiopulmonary endurance, monitor SpO2 as able. SLS, sit to stand onto airex or wedge; quadruped if tolerated?     Ileana Ladd, PT Peter Congo, PT, DPT, CSRS   10/18/2023, 11:52  AM

## 2023-10-19 ENCOUNTER — Ambulatory Visit: Payer: Self-pay

## 2023-10-19 ENCOUNTER — Telehealth: Payer: Self-pay | Admitting: Pharmacist

## 2023-10-19 NOTE — Telephone Encounter (Signed)
Can either Baclofen or Flexeril be administered through the PEG tube? She has prolonged QT syndrome, history of Heart block and so Tizanidine is contraindicated.

## 2023-10-19 NOTE — Telephone Encounter (Signed)
[name],   Pt has PEG tube and methocarbamol cannot be administered via PEG tube. Received call from pharmacy. Upon review, it looks like tizanidine is able to be administered via PEG. If appropriate, would you consider changing to tizanidine instead?

## 2023-10-22 ENCOUNTER — Telehealth: Payer: Self-pay

## 2023-10-22 ENCOUNTER — Other Ambulatory Visit: Payer: Self-pay | Admitting: Pharmacist

## 2023-10-22 MED ORDER — CYCLOBENZAPRINE HCL 10 MG PO TABS: ORAL_TABLET | ORAL | 1 refills | Status: DC

## 2023-10-22 MED ORDER — CYCLOBENZAPRINE HCL 10 MG PO TABS: 10.0000 mg | ORAL_TABLET | Freq: Three times a day (TID) | ORAL | 1 refills | Status: DC | PRN

## 2023-10-22 NOTE — Telephone Encounter (Signed)
Can we get this patient a referral in [place]?

## 2023-10-22 NOTE — Telephone Encounter (Signed)
Thanks, I have changed it to Flexeril.

## 2023-10-22 NOTE — Addendum Note (Signed)
Addended by: Hoy Register on: 10/22/2023 04:50 PM   Modules accepted: Orders

## 2023-10-22 NOTE — Patient Instructions (Signed)
Visit Information  Thank you for taking time to visit with me today. Please don't hesitate to contact me if I can be of assistance to you.   Following are the goals we discussed today:   Goals Addressed             This Visit's Progress    To establish with registered dietician for management of protein/caloric intake   On track    Care Coordination Interventions: Evaluation of current treatment plan related to caloric malnutrition w/G-tube and patient's adherence to plan as established by provider Determined patient has yet to follow up with a Nutritionist, she feels she is not taking in adequate amount of nutrition including protein  Placed joint outbound call with patient and [facility] Medicare, spoke with representative who provided a list of in network Nutritionists located in patient's zip code area [place], [phone], [address], [place], [phone], same address  Danie Chandler, [phone], [address], [employer], [phone], [address], [place], [phone], [address], [address], [place] Discussed with patient this RN will locate an in network provider from the list to ensure they can assist with her nutritional needs based on her diagnosis, after which a referral will be requested from her PCP Discussed plans with patient for ongoing care coordination follow up and provided patient with direct contact information for nurse care coordinator Patient will continue to administer her feedings via PEG as directed Patient will continue to monitor her weight and report persistent weight loss to her PCP Patient will report new symptoms or concerns to her PCP promptly Patient will keep all scheduled follow up appointments as directed  Patient will continue to work with nurse care coordinator for ongoing chronic disease management and care coordination needs with next scheduled call  set for 10/24/23 @12 :00 PM     To optimize treatment of abdominal pain/spasms       Care Coordination Interventions: Evaluation of current treatment plan related to abdominal pain/spasms and patient's adherence to plan as established by provider Determined patient is experiencing abdominal pain and or spasms near her G-tube site Discussed patient is often kept awake during the nighttime secondary to having this pain occur on most nights Determined patient reported her symptoms to her PCP, unfortunately, the PCP prescribed an oral muscle relaxer and patient will need a formula that can be administered via her G tube Sent in basket message to [employer] requesting assistance with identifying an alternate medication  Reply received from [facility] stating Tizanidine can be administered via G-tube, per Franky Macho he entered a  telephone note for [name] to review and consider switching Robaxin to the Tizanidine, noted Cyclobenzaprine may be most appropriate, however a new Rx was not sent in as of yet  Sent additional in basket message to [name] requesting the above stated  Placed outbound call to [facility], spoke with Nena Alexander, who will send a message to [name] regarding this issue             Our next appointment is by telephone on 10/24/23 at 12:00 PM  Please call the care guide team at [phone] if you need to cancel or reschedule your appointment.   If you are experiencing a Mental Health or Behavioral Health Crisis or need someone to talk to, please call 1-800-273-TALK (toll free, 24 hour hotline)  Patient verbalizes understanding of instructions and care plan provided today  and agrees to view in [place]. Active MyChart status and patient understanding of how to access instructions and care plan via MyChart confirmed with patient.     [name] RN BSN CCM [place]  [facility], [facility] Nurse Care Coordinator  Direct  Dial: [phone] Website: [email or web address]

## 2023-10-22 NOTE — Telephone Encounter (Signed)
Copied from CRM [id number]. Topic: Clinical - Prescription Issue >> Oct 22, 2023  4:17 PM Ivette P wrote: Reason for CRM: Angie Lette  - Pt [name] prescribed a medication methocarbamol (ROBAXIN) 500 MG tablet - on 09/06/2023 medication, Pt is not able to take orally, Pt is not allowed to take medication orally, Luke the pharmacist. cyclobenzaprine is an alternate medicine that can be given through [place], would like a follow up on inquiry. Pt is having Persistent spasm [phone] - [name], Nurse Care Coordinator

## 2023-10-22 NOTE — Patient Outreach (Signed)
Care Coordination   Follow Up Visit Note   10/19/2023 Name: Cynthia Underwood MRN: [id number] DOB: Nov 07, 1970  Cynthia Underwood is a 53 y.o. year old female who sees Hoy Register, MD for primary care. I spoke with  Cynthia Underwood by phone today.  What matters to the patients health and wellness today?  Patient would like to establish with a Nutritionist for optimal nutritional management. Patient would like to have G-tube spasms better managed by her doctor.     Goals Addressed             This Visit's Progress    To establish with registered dietician for management of protein/caloric intake   On track    Care Coordination Interventions: Evaluation of current treatment plan related to caloric malnutrition w/G-tube and patient's adherence to plan as established by provider Determined patient has yet to follow up with a Nutritionist, she feels she is not taking in adequate amount of nutrition including protein  Placed joint outbound call with patient and [facility] Medicare, spoke with representative who provided a list of in network Nutritionists located in patient's zip code area [place], [phone], [address] [place], [phone], same address  Danie Chandler, [phone], [address], [employer], [phone], [address], [place], [phone], [address], [address], [place] Discussed with patient this RN will locate an in network provider from the list to ensure they can assist with her nutritional needs based on her diagnosis, after which a referral will be requested from her PCP Discussed plans with patient for ongoing care coordination follow up and provided patient with direct contact information for nurse care coordinator Patient will continue to administer her feedings via PEG as directed Patient will continue to monitor her weight and report persistent weight loss to  her PCP Patient will report new symptoms or concerns to her PCP promptly Patient will keep all scheduled follow up appointments as directed  Patient will continue to work with nurse care coordinator for ongoing chronic disease management and care coordination needs with next scheduled call set for 10/24/23 @12 :00 PM     To optimize treatment of abdominal pain/spasms       Care Coordination Interventions: Evaluation of current treatment plan related to abdominal pain/spasms and patient's adherence to plan as established by provider Determined patient is experiencing abdominal pain and or spasms near her G-tube site Discussed patient is often kept awake during the nighttime secondary to having this pain occur on most nights Determined patient reported her symptoms to her PCP, unfortunately, the PCP prescribed an oral muscle relaxer and patient will need a formula that can be administered via her G tube Sent in basket message to [employer] requesting assistance with identifying an alternate medication  Reply received from [facility] stating Tizanidine can be administered via G-tube, per Franky Macho he entered a  telephone note for [name] to review and consider switching Robaxin to the Tizanidine, noted Cyclobenzaprine may be most appropriate, however a new Rx was not sent in as of yet  Sent additional in basket message to [name] requesting the above stated  Placed outbound call to [facility], spoke with Evette P, who will send a message to [name] regarding this issue         Interventions Today    Flowsheet Row Most Recent Value  Chronic Disease   Chronic disease during today's visit Other  [  Myotonic dystrophy,  Nutritional Imbalance,  PEG tube]  General Interventions   General Interventions Discussed/Reviewed General Interventions Discussed, General Interventions Reviewed, Doctor Visits, Communication with  Doctor Visits Discussed/Reviewed Doctor Visits  Reviewed, Doctor Visits Discussed, PCP, Specialist  Communication with Pharmacists, PCP/Specialists  [facility] Medicare Member Services,  [place] [employer] R[employer],  [name]  Education Interventions   Education Provided Provided Education  Provided Verbal Education On Nutrition  Nutrition Interventions   Nutrition Discussed/Reviewed Nutrition Discussed, Nutrition Reviewed  Pharmacy Interventions   Pharmacy Dicussed/Reviewed Pharmacy Topics Reviewed, Pharmacy Topics Discussed, Medications and their functions          SDOH assessments and interventions completed:  No      Care Coordination Interventions:  Yes, provided   Follow up plan: Follow up call scheduled for 10/24/23 @12 :00 PM    Encounter Outcome:  Patient Visit Completed

## 2023-10-23 ENCOUNTER — Ambulatory Visit: Payer: 59 | Admitting: Speech Pathology

## 2023-10-23 ENCOUNTER — Ambulatory Visit: Payer: 59

## 2023-10-23 DIAGNOSIS — R29898 Other symptoms and signs involving the musculoskeletal system: Secondary | ICD-10-CM | POA: Diagnosis not present

## 2023-10-23 DIAGNOSIS — M6281 Muscle weakness (generalized): Secondary | ICD-10-CM

## 2023-10-23 DIAGNOSIS — R1310 Dysphagia, unspecified: Secondary | ICD-10-CM

## 2023-10-23 DIAGNOSIS — R2689 Other abnormalities of gait and mobility: Secondary | ICD-10-CM | POA: Diagnosis not present

## 2023-10-23 DIAGNOSIS — R2681 Unsteadiness on feet: Secondary | ICD-10-CM | POA: Diagnosis not present

## 2023-10-23 DIAGNOSIS — Z9181 History of falling: Secondary | ICD-10-CM

## 2023-10-23 NOTE — Therapy (Signed)
OUTPATIENT PHYSICAL THERAPY NEURO TREATMENT   Patient Name: Cynthia Underwood MRN: [id number] DOB:03/25/1971, 53 y.o., female Today's Date: 10/23/2023   PCP:[name] REFERRING PROVIDER: Hoy Register, MD  END OF SESSION:  PT End of Session - 10/23/23 1109     Visit Number 10    Number of Visits 13    Date for PT Re-Evaluation 11/06/23    Authorization Type [facility] Medicare/Medicaid    Equipment Utilized During Treatment Gait belt    Activity Tolerance Patient tolerated treatment well    Behavior During Therapy [facility] for tasks assessed/performed                 Past Medical History:  Diagnosis Date   Bifascicular block 10/02/2014   Cataract    Dyspnea    Excess ear wax    Multinodular goiter    Pneumonia    Presence of permanent cardiac pacemaker    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   Sickle cell trait (HCC)    Watery eyes    Left   Weakness of both legs    Past Surgical History:  Procedure Laterality Date   BREAST BIOPSY Right    EYE SURGERY Left 2017   "related to blockage in my nose"   EYE SURGERY Right 05/2019   INSERT / REPLACE / REMOVE PACEMAKER  02/15/2017   IR GASTROSTOMY TUBE MOD SED  01/01/2023   PACEMAKER IMPLANT N/A 02/15/2017   Procedure: Pacemaker Implant;  Surgeon: Duke Salvia, MD;  Location: [facility] INVASIVE CV LAB;  Service: Cardiovascular;  Laterality: N/A;   RIGHT/LEFT HEART CATH AND CORONARY ANGIOGRAPHY N/A 11/16/2022   Procedure: RIGHT/LEFT HEART CATH AND CORONARY ANGIOGRAPHY;  Surgeon: Laurey Morale, MD;  Location: [facility] INVASIVE CV LAB;  Service: Cardiovascular;  Laterality: N/A;   THYROIDECTOMY N/A 04/21/2021   Procedure: TOTAL THYROIDECTOMY;  Surgeon: Axel Filler, MD;  Location: [facility] OR;  Service: General;  Laterality: N/A;   Patient Active Problem List   Diagnosis Date Noted   Aspiration pneumonia (HCC) 05/21/2023   S/P percutaneous endoscopic gastrostomy (PEG) tube placement (HCC)  03/13/2023   Diarrhea 01/23/2023   SIRS (systemic inflammatory response syndrome) (HCC) 01/23/2023   Chronic respiratory failure with hypoxia (HCC) 01/23/2023   Dysphagia 01/23/2023   Chronic systolic CHF (congestive heart failure) (HCC) 01/23/2023   Acute on chronic respiratory failure with hypoxia (HCC) 12/07/2022   Tracheostomy status (HCC) 12/07/2022   Generalized anxiety disorder 12/06/2022   Acute systolic heart failure (HCC) 11/26/2022   Drug-induced torsades de pointes (HCC) 11/17/2022   Acute systolic CHF (congestive heart failure) (HCC) 11/16/2022   Acute hypoxemic respiratory failure (HCC) 11/15/2022   Acute on chronic combined systolic and diastolic CHF (congestive heart failure) (HCC) 11/15/2022   Protein-calorie malnutrition, severe 11/15/2022   Cardiac arrest (HCC) 11/14/2022   Healthcare-associated pneumonia 11/11/2022   Hypothyroidism 11/07/2021   Thyroid nodule 04/21/2021   S/P total thyroidectomy 04/21/2021   Sinus node dysfunction (HCC) 03/10/2020   Weakness of both legs    Watery eyes    Sickle cell trait (HCC)    Presence of permanent cardiac pacemaker    Excess ear wax    Depression    Anemia    Cardiac pacemaker in situ 03/01/2017   Mobitz type 2 second degree heart block 02/15/2017   Symptomatic advanced heart block 02/12/2017   SOB (shortness of breath) 10/02/2014   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  Bifascicular block 10/02/2014   Infection due to trichomonas vaginalis 09/15/2014   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  Pt wearing her portable O2 today, on 3L O2 at rest.   Pt accompanied by: self  PERTINENT HISTORY: Starting around the age of 89, she started having lock jaw and stiffness of the hands.  Over the years, she developed worsening stiffness of her fingers and hands.  she ultimately was genetically tested at the age of 78 which confirmed the diagnosis of myotonic dystrophy type 1. She has a strong family history of DM1 including maternal aunt, cousins x 2, and younger sister.  She does not have any children and has no future plans for pregnancy.   Symptoms were relatively stable until her mid-30s and then started developing fatigue, weakness, daytime sleepiness, and shortness of breath with exertion.  She walks independently but was told previously told to use leg braces.  Over the past few years, she noticed intermittent difficulty swallowing liquids. She underwent barium swallow which showed signs of aspiration and she was recommended to use a straw and use chin tuck position.     She was seeing several neurologists over the years at [address] in [place].  She is not working and has been on disability since 2010.  PAIN:  Are you having pain? No  PRECAUTIONS: Fall, ICD/Pacemaker, and Other: G-Tube  RED FLAGS: None   WEIGHT BEARING RESTRICTIONS: No  FALLS: Has patient fallen in last 6 months? No  LIVING ENVIRONMENT: Lives with:  cousin Lives in: House/apartment Stairs: Yes: Internal: 12 steps; on right going up Has following equipment at home: Walker - 4 wheeled  PLOF: Needs assistance with ADLs and Needs assistance with homemaking  PATIENT GOALS: improve walking.   OBJECTIVE:  Note: Objective measures were completed at Evaluation unless otherwise noted. COGNITION: Overall cognitive status: Within functional limits for  tasks assessed  LOWER EXTREMITY ROM:     Active  Right Eval Left Eval  Hip flexion    Hip extension    Hip abduction    Hip adduction  Hip internal rotation    Hip external rotation    Knee flexion    Knee extension    Ankle dorsiflexion    Ankle plantarflexion    Ankle inversion    Ankle eversion     (Blank rows = not tested)  LOWER EXTREMITY MMT:    MMT Right Eval Left Eval  Hip flexion    Hip extension    Hip abduction    Hip adduction    Hip internal rotation    Hip external rotation    Knee flexion    Knee extension    Ankle dorsiflexion    Ankle plantarflexion    Ankle inversion    Ankle eversion    (Blank rows = not tested)  BED MOBILITY:  Sit to supine Complete Independence Supine to sit Complete Independence Rolling to Right Complete Independence Rolling to Left Complete Independence  TRANSFERS: Assistive device utilized: Environmental consultant - 4 wheeled  Sit to stand: Modified independence Stand to sit: Modified independence Chair to chair: Modified independence  GAIT: Gait pattern: decreased arm swing- Right, decreased arm swing- Left, decreased step length- Right, decreased step length- Left, Right foot flat, Left foot flat, knee flexed in stance- Right, knee flexed in stance- Left, decreased trunk rotation, trunk flexed, poor foot clearance- Right, and poor foot clearance- Left Distance walked: 530' Assistive device utilized: Environmental consultant - 4 wheeled Level of assistance: SBA   FUNCTIONAL TESTS:  5 times sit to stand: 13 sec no UE support (eval);  6 minute walk test: SpO2 before walk was 96% 530 feet with RW and one standing break. SpO2 during break wa 98%pt on 3L of O2.; 10/23/23: 1168' without AD, SpO2 before 98% and post 100% without supplemental O2.  Ankle AROM: (09/14/23)  Dorsiflexion: R = 10 deg; L = lacks 8 deg to neutral with increased supination noted in L foot.  Plantarflexion: R = 40 deg; L  = 20 deg                                                                                                                               TREATMENT:    Pt removes her O2 during PT session by choice, vitals without supplemental O2 was 94%  6 minute walk test performed  Forward seated lumbar flexion stretch: 3 x 10" holds Standing lumbar flexion: 3x Standing lumbar flexion: 10 lbs 5x Seated unilateral hamstring stretch: 3 x 30" R and L Reviewed goals with patient, walking program of upto 10 min 3x/day or as patient is able to, prioritizing walking over exercises, we discussed plan for next session for discharge and review of her HEP. We discussed compliance with HEP.  PATIENT EDUCATION: Education details: continue HEP especially LE stretches due to ongoing hip tightness Person educated: Patient Education method: Medical illustrator Education comprehension: verbalized understanding, returned demonstration, and needs further education  HOME EXERCISE PROGRAM: Access Code: [email or web address] URL: [email or web address] Date: 09/14/2023 Prepared by: Lavone Nian  Exercises - Supine March  - 1 x daily - 7 x weekly - 2 sets - 10 reps - Bilateral Bent Leg Lift  - 1 x daily - 7 x weekly - 2 sets - 10 reps - Supine Straight Leg Raises  - 1 x daily - 7 x weekly - 2 sets - 10 reps - Supine Bridge  - 1 x daily - 7 x weekly - 2 sets - 10 reps - Sidelying Hip Abduction  - 1 x daily - 7 x weekly - 2 sets - 10 reps - Seated Heel Toe Raises  - 1 x daily - 7 x weekly - 2 sets - 10 reps - Seated Heel Raise  - 1 x daily - 7 x weekly - 2 sets - 10 reps - Modified Thomas Stretch  - 1 x daily - 7 x weekly - 1 sets - 3-5 reps - 30 sec hold - Standing Gastroc Stretch at Counter  - 1 x daily - 7 x weekly - 1 sets - 3-5 reps - 30 sec hold - Standing Hip Flexor Stretch  - 1 x daily - 7 x weekly - 1 sets - 3-5 reps - 30 sec hold   GOALS: Goals reviewed with patient? Yes  SHORT TERM GOALS: Target date: 10/09/2023    Patient will be 50% compliant  with Hep to self manage her symptoms.  Baseline: pt alternates PT and SLP exercises daily Goal status: MET     LONG TERM GOALS: Target date: 11/06/2023    Patient will demo >700' of ambulation in 6 minutes with RW to improve walking endurance.  Baseline: 530' RW (09/11/23); 1168' without AD (10/23/23) Goal status: Goal met  2.  Patient will be able to perform SLS of >5 sec on R and L LE to improve static balance.  Baseline: 1 sec on L LE; 2 sec on R LE (09/11/23); >15 sec  Rand L (10/01/23) Goal status: goal met  3.  Pt will perform 5x sit to stand without UE support in <10 sec to improve overall functional strength.  Baseline: 13 sec without UE support (09/11/23); 13 sec without UE support (10/23/23) Goal status: INITIAL  4.  Pt will demo 100% compliance with HEP to self manage her symptoms . Baseline: TBD Goal status: goal met    ASSESSMENT:  CLINICAL IMPRESSION: Pt demo significant improvement in her 6 minute walk test compared to initial evaluation. Patient has met LTG#1 and LTG#4 today. Pt demo no significant improvement in her sit to stand goal (LTG#3). Overall patient has made a significant progress in therapy and will benefit from one more session to finalize HEP.  OBJECTIVE IMPAIRMENTS: Abnormal gait, decreased activity tolerance, decreased balance, decreased endurance, decreased mobility, difficulty walking, decreased strength, impaired flexibility, impaired tone, impaired UE functional use, postural dysfunction, and pain.   ACTIVITY LIMITATIONS: carrying, lifting, bending, standing, squatting, stairs, transfers, and bathing  PARTICIPATION LIMITATIONS: meal prep, cleaning, laundry, driving, shopping, community activity, and yard work  PERSONAL FACTORS: Age and Time since onset of injury/illness/exacerbation are also affecting patient's functional outcome.   REHAB POTENTIAL: Good  CLINICAL DECISION MAKING: Stable/uncomplicated  EVALUATION COMPLEXITY: Low  PLAN:  PT  FREQUENCY: 2x/week  PT DURATION: 6 weeks  PLANNED INTERVENTIONS: 97164- PT Re-evaluation, 97110-Therapeutic exercises, 97530- Therapeutic activity, 97112- Neuromuscular re-education, 97535- Self Care, 16109- Manual therapy, [id number]- Gait training, Patient/Family education, Balance training, Stair training, Joint mobilization, DME instructions, Cryotherapy, and Moist heat  PLAN FOR NEXT SESSION:  POC discussed with  patient on discharging in next 2 sessions with updated HEP. Reassess walking and sit to stand (LTG #1 and 3) next session; Update HEP (consider functional exercises- sit to stand, standing lumbar flexion, SLS, tandem stance, eyes closed balance activities). Continue to work on core strength, dynamic gait and mobility, cardiopulmonary endurance, monitor SpO2 as able. SLS, sit to stand onto airex or wedge; quadruped if tolerated?     Ileana Ladd, PT    10/23/2023, 11:10 AM

## 2023-10-23 NOTE — Therapy (Signed)
OUTPATIENT SPEECH LANGUAGE PATHOLOGY TREATMENT NOTE   Patient Name: Cynthia Underwood MRN: [id number] DOB:17-Nov-1970, 53 y.o., female Today's Date: 10/23/2023  PCP: Hoy Register, MD REFERRING PROVIDER: Hoy Register, MD  END OF SESSION:  End of Session - 10/23/23 1104     Visit Number 12    Number of Visits 17    Date for SLP Re-Evaluation 11/09/23    SLP Start Time 1145    SLP Stop Time  1230    SLP Time Calculation (min) 45 min    Activity Tolerance Patient tolerated treatment well              Past Medical History:  Diagnosis Date   Bifascicular block 10/02/2014   Cataract    Dyspnea    Excess ear wax    Multinodular goiter    Pneumonia    Presence of permanent cardiac pacemaker    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   Sickle cell trait (HCC)    Watery eyes    Left   Weakness of both legs    Past Surgical History:  Procedure Laterality Date   BREAST BIOPSY Right    EYE SURGERY Left 2017   "related to blockage in my nose"   EYE SURGERY Right 05/2019   INSERT / REPLACE / REMOVE PACEMAKER  02/15/2017   IR GASTROSTOMY TUBE MOD SED  01/01/2023   PACEMAKER IMPLANT N/A 02/15/2017   Procedure: Pacemaker Implant;  Surgeon: Duke Salvia, MD;  Location: [facility] INVASIVE CV LAB;  Service: Cardiovascular;  Laterality: N/A;   RIGHT/LEFT HEART CATH AND CORONARY ANGIOGRAPHY N/A 11/16/2022   Procedure: RIGHT/LEFT HEART CATH AND CORONARY ANGIOGRAPHY;  Surgeon: Laurey Morale, MD;  Location: [facility] INVASIVE CV LAB;  Service: Cardiovascular;  Laterality: N/A;   THYROIDECTOMY N/A 04/21/2021   Procedure: TOTAL THYROIDECTOMY;  Surgeon: Axel Filler, MD;  Location: [facility] OR;  Service: General;  Laterality: N/A;   Patient Active Problem List   Diagnosis Date Noted   Aspiration pneumonia (HCC) 05/21/2023   S/P percutaneous endoscopic gastrostomy (PEG) tube placement (HCC) 03/13/2023   Diarrhea 01/23/2023   SIRS (systemic inflammatory  response syndrome) (HCC) 01/23/2023   Chronic respiratory failure with hypoxia (HCC) 01/23/2023   Dysphagia 01/23/2023   Chronic systolic CHF (congestive heart failure) (HCC) 01/23/2023   Acute on chronic respiratory failure with hypoxia (HCC) 12/07/2022   Tracheostomy status (HCC) 12/07/2022   Generalized anxiety disorder 12/06/2022   Acute systolic heart failure (HCC) 11/26/2022   Drug-induced torsades de pointes (HCC) 11/17/2022   Acute systolic CHF (congestive heart failure) (HCC) 11/16/2022   Acute hypoxemic respiratory failure (HCC) 11/15/2022   Acute on chronic combined systolic and diastolic CHF (congestive heart failure) (HCC) 11/15/2022   Protein-calorie malnutrition, severe 11/15/2022   Cardiac arrest (HCC) 11/14/2022   Healthcare-associated pneumonia 11/11/2022   Hypothyroidism 11/07/2021   Thyroid nodule 04/21/2021   S/P total thyroidectomy 04/21/2021   Sinus node dysfunction (HCC) 03/10/2020   Weakness of both legs    Watery eyes    Sickle cell trait (HCC)    Presence of permanent cardiac pacemaker    Excess ear wax    Depression    Anemia    Cardiac pacemaker in situ 03/01/2017   Mobitz type 2 second degree heart block 02/15/2017   Symptomatic advanced heart block 02/12/2017   SOB (shortness of breath) 10/02/2014   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   Bifascicular block 10/02/2014  Infection due to trichomonas vaginalis 09/15/2014   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 Accompanied by: self  PERTINENT HISTORY: myotonic dystophy, hypothyroidism, anxiety. PEG dependence.   PAIN:  Are you having pain? No  FALLS: Has patient fallen in last 6 months?  No  LIVING ENVIRONMENT: Lives with: lives with their family Lives in: House/apartment  PLOF:  Level of assistance: Independent with ADLs, Independent with IADLs Employment: On disability  PATIENT GOALS: Ability to tolerate p.o.  OBJECTIVE:  Note: Objective measures were completed at Evaluation unless otherwise noted. OBJECTIVE:   INSTRUMENTAL SWALLOW STUDY FINDINGS (MBSS) 06/18/2023 Clinical Impression Pt continues to exhibit signifcant pharyngoesophageal dysphagia marked by reduced tongue base retraction, decreased laryngeal elevation, no epiglottic deflection and reduced anterior hyoid excursion. Majority of boluses remained in valleculae and pyriform sinuses with reduced UES opening. Thin liquid with teaspoon penetrated near cord level, cups sip thin penetrated after the swallow (PAS 3), nectar thick penetrated from residue and puree while performing a chin tuck spilled into airway (PAS 3). Pt did throat clear several times throughout study, however overall sensation was reduced. She demonstrated significant difficulty initiating a second swallow stating that her mouth was dry in attempts to clear residue. A left and right head turn and dry swallows were not effective. Esophageal scan revealed stasis in distal esophagus. Recommend pt continue NPO status and use PEG for nutrition. She may have sips of thin water (only) after oral care, however pt states that she does not like water. She also states that she gets hungry despite her tube feedings. Pt and cousin report she has not been having home health ST and SLP recommends home health ST to work on pharyngeal strength.  COGNITION: Overall cognitive status: Within functional limits for tasks assessed  SUBJECTIVE DYSPHAGIA  REPORTS:  Date of onset: March 2024 Reported symptoms: coughing with both solids and liquids, globus sensation, xerostomia, shortness of breath with PO, and weak voice  Current diet:  PEG dependent, thin liquid (water) PO  Co-morbid voice changes: Yes  Weight Loss: 20 lbs   FACTORS WHICH MAY INCREASE RISK OF ADVERSE EVENT IN PRESENCE OF ASPIRATION:  General health: frail or deconditioned  Risk factors: reduced respiratory function, weak cough, GERD or other GI disease, and tube present (PEG)     ORAL MOTOR EXAMINATION: Overall status: Impaired: overall reduced strength evidenced  CLINICAL SWALLOW ASSESSMENT:   Dentition: dentures (upper), some natural dentition on bottom -- eats without teeth  Vocal quality at baseline: hoarse, low vocal intensity, and vocal fatigue Patient directly observed with POs: Yes: thin liquids  Feeding: able to feed self Liquids provided by: teaspoon and cup Yale Swallow Protocol: Pass Oral phase signs and symptoms: anterior loss/spillage Pharyngeal phase signs and symptoms: multiple swallows, wet vocal quality, delayed throat clear, and delayed cough  PATIENT REPORTED OUTCOME MEASURES (PROM): deferred   TODAY'S TREATMENT:  10-23-23: Pt reported occasional completion of effortful swallows at home, but minimal practice with other exercises in HEP. ST reviewed each exercise on patient's HEP to facilitate pt understanding of expectations and ensure exercises are completed appropriately.  ST initiated effortful swallows and pt completed 50 in total given min-A. Pt demonstrated x10 Mendelson exercises and 10x Masako swallow with max/mod-A, a model and verbal prompting. ST initiated pt demonstration of positioning and recall of CTAR with pt completing demonstration given min-A and requiring clarification regarding repetitions recommended for daily practice. SLP provided handout and exercise schedule to aid continued practice of HEP, and ensure exercises are completed  appropriately and on a regular basis. Scheduled follow up appointment for after MBS. Will revisit POC at that time based on instrumental findings.   10-16-23: Reported difficulty with tongue tip elevation/protrusion while completing Masako. Endorsed completing 2-3 reps at a time prior to needing break. Initiated education and instruction of tongue press x20 to increase lingual strength with rare min A required. Pt reported limited pharyngeal muscle engagement during tongue press d/t wearing dentures. Recommended including tongue press into HEP. Reviewed remaining swallow exercises. Demonstrated Mendelsohn exercises x10 with extra time, effortful swallows x10 with small, single cup sips, Masako swallows x10, CTAR isokinetic x10, and CTAR isometric 60 seconds x3. Provided MBSS scheduling number to call to schedule swallow study.   10/12/23: ST guided pt through Shaker exercises, pt completed 2 sets of 10 with min assist, and completed chin tuck hold for 25 seconds w/out additional support. Pt shared no practice with exercises over the past week d/t travel and ST reminded pt of the necessity for daily practice. ST initiated practice with effortful swallows with apple sauce to target strengthened, functional swallowing with a bolus. Pt participated effectively with min/mod cues. ST then reviewed and facilitated Mendelson Maneuver technique and Masako swallow exercise. Pt completed 15 trials of Mendelson with min-A, and required additional cues for the [facility] exercise.  ST inquired with pt regarding follow up MBS to further assess swallow and progress made in therapy, pt agreed. Exercise tracker provided for increased compliance with HEP, pt to fill out daily and bring with to next appointment.   10/05/23: Pt reported difficulty completing masako swallows and 50x effortful swallows each day d/t stomach pains from drinking water. SLP reminded pt effortful swallows can be completed dry with out taking sips, and pt agreed  to completing sufficient practice at home. SLP facilitated Shaker exercise to target increased strength/control of pharyngeal muscles and pt participated successfully. Pt demonstrated increased strength performing the exercise with min-A and held positioning for the technique for ~15 seconds without support from SLP.    10/02/23: Discussed swallow HEP, in which pt reports completing effortful swallows at home. Not completing specific number per patient report; reportedly completing effortful swallows with sips of water throughout day. Recommended aiming for effortful swallows 50x/day at minimum. Reviewed importance of maximal effort to achieve greatest benefit during effortful swallows. With additional review of HEP in patient instructions, pt endorsed completing Mendelsohn and CTAR. Denied difficulty or need for review. Introduced Masako swallow today to address reduced BOT retraction. Modified exercise to elevate tongue tip up versus protrusion out d/t difficulty demonstrated. Pt completed effortful swallows x10 with tsp sips of water and modified masakos x7. Intermittent delayed coughing noted; cued strong cough to clear. Updated HEP to include 10 Masako swallows per day. Denied additional questions.   PATIENT EDUCATION: Education details: see above Person educated: Patient and Caregiver cousin Education method: Explanation, Demonstration, Verbal cues, and Handouts Education comprehension: verbalized understanding, returned demonstration, and needs further education   ASSESSMENT:  CLINICAL IMPRESSION: Patient is a 53 y.o. F who was seen today for dysphagia treatment session. Pt mostly compliant  with HEP. Evidencing mod-I for prescribed exercises during therapy session. Reduced insight into deficits evidenced. Continues to benefit from skilled ST to address dysphagia to improve QoL. Pt MBSS is scheduled for March 6th to determine efficacy of current regimen and updated recommendation per current  function/safety. Scheduled follow up appointment for after MBS. Will revisit POC at that time based on instrumental findings.   OBJECTIVE IMPAIRMENTS: include dysphagia. These impairments are limiting patient from safety when swallowing. Factors affecting potential to achieve goals and functional outcome are severity of impairments. Patient will benefit from skilled SLP services to address above impairments and improve overall function.  REHAB POTENTIAL: Good   GOALS: Goals reviewed with patient? Yes  SHORT TERM GOALS: Target date: 09/14/2023   Pt will report daily HEP completion over 1 week period  Baseline: Goal status: MET  2.  Pt will accurately demonstrate dysphagia exercises with min-A Baseline:  Goal status: MET  3.  Pt will increase oral care BID over one week period Baseline:  Goal status: MET   LONG TERM GOALS: Target date:11/09/2023 (+4 weeks for recert)   Pt will be independent with dysphagia HEP Baseline:  Goal status: IN PROGRESS  2.  Pt will complete MBSS  Baseline:  Goal status: IN PROGRESS  3.  Pt will utilize swallow compensations and strategies to support PO consumption of least restrictive diet based on function demonstrated on repeat instrumental swallow study Baseline:  Goal status: IN PROGRESS    PLAN:  SLP FREQUENCY: 2x/week  SLP DURATION: 8 weeks + 4 weeks for recert, 12 weeks total   PLANNED INTERVENTIONS: 92526 Treatment of swallowing function, Re-evaluation, Aspiration precaution training, Pharyngeal strengthening exercises, Diet toleration management , Trials of upgraded texture/liquids, SLP instruction and feedback, Compensatory strategies, and Patient/family education   [place], Student-SLP 10/23/2023, 12:52 PM

## 2023-10-23 NOTE — Patient Instructions (Addendum)
10 Masako Swallows (tongue behind teeth - keep it forward when you swallow) every day! (On commercial breaks)   50 Effortful Swallows (these can be dry swallows)    "Half Swallow" exercise - holding up adams apple with tongue on the roof of your mouth.   CTAR - Chin Tuck Against Resistance  - Place towel, ball or pool noodle under your chin - Hold for 60 seconds 2-3x  a day - Pulse up and down 20x 2-3x a day

## 2023-10-24 ENCOUNTER — Ambulatory Visit: Payer: Self-pay

## 2023-10-24 ENCOUNTER — Other Ambulatory Visit: Payer: Self-pay | Admitting: Family Medicine

## 2023-10-24 DIAGNOSIS — Z931 Gastrostomy status: Secondary | ICD-10-CM

## 2023-10-24 DIAGNOSIS — E43 Unspecified severe protein-calorie malnutrition: Secondary | ICD-10-CM

## 2023-10-24 NOTE — Patient Outreach (Signed)
  Care Coordination   Follow Up Visit Note   10/24/2023 Name: Cynthia Underwood MRN: [id number] DOB: Jan 05, 1971  Cynthia Underwood is a 53 y.o. year old female who sees Cynthia Register, MD for primary care. I spoke with Cynthia Underwood by phone today.  What matters to the patients health and wellness today?  Patient would like to establish with outpatient clinical nutritionist to help manage tube feedings and gain weight.     Goals Addressed             This Visit's Progress    To establish with registered dietician for management of protein/caloric intake   On track    Care Coordination Interventions: Evaluation of current treatment plan related to caloric malnutrition w/G-tube and patient's adherence to plan as established by provider Placed outbound call to [facility] Outpatient Clinical Nutritionist, discussed the referral process and confirmed this nutritionist will assess patient patient for nutritional needs including the appropriate tube feeding and help to improve her BMI Determined PCP may send a referral via EPIC and or fax to [phone], discussed the [facility] location is booked out until August 2025, the [facility] location is booked out to May 2025, discussed patient can be placed on a cancellation list  Sent in basket message to PCP requesting the nutritional referral be placed via EPIC and or send by fax as stated above  Spoke with sister Cynthia Underwood, informed her of the nutritionist referral and request for PCP to place referral, educated on next steps, instructed patient to schedule with the [facility] location if able to be seen earlier, instructed to add patient to the cancellation list  Discussed plans with patient for ongoing care coordination follow up and provided patient with direct contact information for nurse care coordinator Patient will continue to administer her feedings via PEG as directed Patient will continue to monitor her weight and report  persistent weight loss to her PCP Patient will wait to hear from [facility] Outpatient Nutritionist to schedule her initial visit  Patient will keep all scheduled follow up appointments as directed  Patient will continue to work with nurse care coordinator for ongoing chronic disease management and care coordination needs with next scheduled call set for 11/07/23 @1 :00 PM    Interventions Today    Flowsheet Row Most Recent Value  Chronic Disease   Chronic disease during today's visit Other  [dysphagia,  myotonic dystrophy,  malnutrition]  General Interventions   General Interventions Discussed/Reviewed Communication with, General Interventions Discussed, General Interventions Reviewed  Communication with PCP/Specialists  [[name],  [facility] Clinical Nutritionist]  Exercise Interventions   Exercise Discussed/Reviewed Weight Managment  Weight Management Weight maintenance  Education Interventions   Education Provided Provided Education  Provided Verbal Education On Nutrition, Medication  Nutrition Interventions   Nutrition Discussed/Reviewed Nutrition Discussed, Nutrition Reviewed  Pharmacy Interventions   Pharmacy Dicussed/Reviewed Pharmacy Topics Reviewed, Pharmacy Topics Discussed, Medications and their functions          SDOH assessments and interventions completed:  No     Care Coordination Interventions:  Yes, provided   Follow up plan: Follow up call scheduled for 11/07/23 @1 :00 PM    Encounter Outcome:  Patient Visit Completed

## 2023-10-24 NOTE — Patient Instructions (Signed)
Visit Information  Thank you for taking time to visit with me today. Please don't hesitate to contact me if I can be of assistance to you.   Following are the goals we discussed today:   Goals Addressed             This Visit's Progress    To establish with registered dietician for management of protein/caloric intake   On track    Care Coordination Interventions: Evaluation of current treatment plan related to caloric malnutrition w/G-tube and patient's adherence to plan as established by provider Placed outbound call to [facility] Outpatient Clinical Nutritionist, discussed the referral process and confirmed this nutritionist will assess patient patient for nutritional needs including the appropriate tube feeding and help to improve her BMI Determined PCP may send a referral via EPIC and or fax to [phone], discussed the [facility] location is booked out until August 2025, the [facility] location is booked out to May 2025, discussed patient can be placed on a cancellation list  Sent in basket message to PCP requesting the nutritional referral be placed via EPIC and or send by fax as stated above  Spoke with sister [name], informed her of the nutritionist referral and request for PCP to place referral, educated on next steps, instructed patient to schedule with the [facility] location if able to be seen earlier, instructed to add patient to the cancellation list  Discussed plans with patient for ongoing care coordination follow up and provided patient with direct contact information for nurse care coordinator Patient will continue to administer her feedings via PEG as directed Patient will continue to monitor her weight and report persistent weight loss to her PCP Patient will wait to hear from [facility] Outpatient Nutritionist to schedule her initial visit  Patient will keep all scheduled follow up appointments as directed  Patient will continue to  work with nurse care coordinator for ongoing chronic disease management and care coordination needs with next scheduled call set for 11/07/23 @1 :00 PM        Our next appointment is by telephone on 11/07/23 at 1:00 PM  Please call the care guide team at [phone] if you need to cancel or reschedule your appointment.   If you are experiencing a Mental Health or Behavioral Health Crisis or need someone to talk to, please call 1-800-273-TALK (toll free, 24 hour hotline)  Patient verbalizes understanding of instructions and care plan provided today and agrees to view in MyChart. Active MyChart status and patient understanding of how to access instructions and care plan via MyChart confirmed with patient.     [name] RN BSN CCM [place]  [facility], [facility] Nurse Care Coordinator  Direct Dial: [phone] Website: [email or web address]

## 2023-10-24 NOTE — Telephone Encounter (Signed)
Call to patient unable to reach VM left. Call was to give patient the information listed below.   Referral  was sent  06/2023  to  [facility]   Referral notes  show   they were unable to contact patient .  Patient will need to contact them to schedule  an appointment  .  They closed  the referral  Type Date User Summary Attachment Provider Comments 06/12/2023 12:20 PM Cheryll Dessert E [facility] Referral - Note: Placed in [facility] [address] [place], [place] [address] PH# [phone]

## 2023-10-25 ENCOUNTER — Ambulatory Visit: Payer: 59

## 2023-10-25 DIAGNOSIS — R2681 Unsteadiness on feet: Secondary | ICD-10-CM | POA: Diagnosis not present

## 2023-10-25 DIAGNOSIS — R29898 Other symptoms and signs involving the musculoskeletal system: Secondary | ICD-10-CM | POA: Diagnosis not present

## 2023-10-25 DIAGNOSIS — M6281 Muscle weakness (generalized): Secondary | ICD-10-CM | POA: Diagnosis not present

## 2023-10-25 DIAGNOSIS — R1310 Dysphagia, unspecified: Secondary | ICD-10-CM | POA: Diagnosis not present

## 2023-10-25 DIAGNOSIS — R2689 Other abnormalities of gait and mobility: Secondary | ICD-10-CM | POA: Diagnosis not present

## 2023-10-25 DIAGNOSIS — Z9181 History of falling: Secondary | ICD-10-CM | POA: Diagnosis not present

## 2023-10-25 NOTE — Therapy (Signed)
OUTPATIENT PHYSICAL THERAPY NEURO TREATMENT   Patient Name: Cynthia Underwood MRN: [id number] DOB:[birth year], 53 y.o., female Today's Date: 10/25/2023 PHYSICAL THERAPY DISCHARGE SUMMARY  Visits from Start of Care: 11  Current functional level related to goals / functional outcomes: Independent with gait and transfers without AD   Remaining deficits: none   Education / Equipment: HEP   Patient agrees to discharge. Patient goals were partially met. Patient is being discharged due to maximized rehab potential.    PCP:[name] REFERRING PROVIDER: Hoy Register, MD  END OF SESSION:  PT End of Session - 10/25/23 1121     Visit Number 11    Number of Visits 13    Date for PT Re-Evaluation 11/06/23    Authorization Type UHC Medicare/Medicaid    PT Start Time 1115    PT Stop Time 1200    PT Time Calculation (min) 45 min    Equipment Utilized During Treatment Gait belt    Activity Tolerance Patient tolerated treatment well    Behavior During Therapy WFL for tasks assessed/performed                 Past Medical History:  Diagnosis Date   Bifascicular block 10/02/2014   Cataract    Dyspnea    Excess ear wax    Multinodular goiter    Pneumonia    Presence of permanent cardiac pacemaker    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   Sickle cell trait (HCC)    Watery eyes    Left   Weakness of both legs    Past Surgical History:  Procedure Laterality Date   BREAST BIOPSY Right    EYE SURGERY Left 2017   "related to blockage in my nose"   EYE SURGERY Right 05/2019   INSERT / REPLACE / REMOVE PACEMAKER  02/15/2017   IR GASTROSTOMY TUBE MOD SED  01/01/2023   PACEMAKER IMPLANT N/A 02/15/2017   Procedure: Pacemaker Implant;  Surgeon: Duke Salvia, MD;  Location: [facility] INVASIVE CV LAB;  Service: Cardiovascular;  Laterality: N/A;   RIGHT/LEFT HEART CATH AND CORONARY ANGIOGRAPHY N/A 11/16/2022   Procedure: RIGHT/LEFT HEART  CATH AND CORONARY ANGIOGRAPHY;  Surgeon: Laurey Morale, MD;  Location: [facility] INVASIVE CV LAB;  Service: Cardiovascular;  Laterality: N/A;   THYROIDECTOMY N/A 04/21/2021   Procedure: TOTAL THYROIDECTOMY;  Surgeon: Axel Filler, MD;  Location: [facility] OR;  Service: General;  Laterality: N/A;   Patient Active Problem List   Diagnosis Date Noted   Aspiration pneumonia (HCC) 05/21/2023   S/P percutaneous endoscopic gastrostomy (PEG) tube placement (HCC) 03/13/2023   Diarrhea 01/23/2023   SIRS (systemic inflammatory response syndrome) (HCC) 01/23/2023   Chronic respiratory failure with hypoxia (HCC) 01/23/2023   Dysphagia 01/23/2023   Chronic systolic CHF (congestive heart failure) (HCC) 01/23/2023   Acute on chronic respiratory failure with hypoxia (HCC) 12/07/2022   Tracheostomy status (HCC) 12/07/2022   Generalized anxiety disorder 12/06/2022   Acute systolic heart failure (HCC) 11/26/2022   Drug-induced torsades de pointes (HCC) 11/17/2022   Acute systolic CHF (congestive heart failure) (HCC) 11/16/2022   Acute hypoxemic respiratory failure (HCC) 11/15/2022   Acute on chronic combined systolic and diastolic CHF (congestive heart failure) (HCC) 11/15/2022   Protein-calorie malnutrition, severe 11/15/2022   Cardiac arrest (HCC) 11/14/2022   Healthcare-associated pneumonia 11/11/2022   Hypothyroidism 11/07/2021   Thyroid nodule 04/21/2021   S/P total thyroidectomy 04/21/2021   Sinus node dysfunction (HCC) 03/10/2020   Weakness of  both legs    Watery eyes    Sickle cell trait (HCC)    Presence of permanent cardiac pacemaker    Excess ear wax    Depression    Anemia    Cardiac pacemaker in situ 03/01/2017   Mobitz type 2 second degree heart block 02/15/2017   Symptomatic advanced heart block 02/12/2017   SOB (shortness of breath) 10/02/2014   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   Bifascicular block 10/02/2014   Infection due to trichomonas  vaginalis 09/15/2014   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  Pt wearing her portable O2 today, on 3L O2 at rest.   Pt accompanied by: self  PERTINENT HISTORY: Starting around the age of 11, she started having lock jaw and stiffness of the hands.  Over the years, she developed worsening stiffness of her fingers and hands.  she ultimately was genetically tested at the age of 37 which confirmed the diagnosis of myotonic dystrophy type 1. She has a strong family history of DM1 including maternal aunt, cousins x 2, and younger sister.  She does not have any children and has no future plans for pregnancy.   Symptoms were relatively stable until her mid-30s and then started developing fatigue, weakness, daytime sleepiness, and shortness of breath with exertion.  She walks independently but was told previously told to use leg braces.  Over the past few years, she noticed intermittent difficulty swallowing liquids. She underwent barium swallow which showed signs of aspiration and she was recommended to use a straw and use chin tuck position.     She was seeing several neurologists over the years at [address] in [place].  She is not working and has been on disability since 2010.  PAIN:  Are you having pain? No  PRECAUTIONS: Fall, ICD/Pacemaker, and Other: G-Tube  RED FLAGS: None   WEIGHT BEARING RESTRICTIONS: No  FALLS: Has patient fallen in last 6 months?  No  LIVING ENVIRONMENT: Lives with:  cousin Lives in: House/apartment Stairs: Yes: Internal: 12 steps; on right going up Has following equipment at  home: Dan Humphreys - 4 wheeled  PLOF: Needs assistance with ADLs and Needs assistance with homemaking  PATIENT GOALS: improve walking.   OBJECTIVE:  Note: Objective measures were completed at Evaluation unless otherwise noted. COGNITION: Overall cognitive status: Within functional limits for tasks assessed  LOWER EXTREMITY ROM:     Active  Right Eval Left Eval  Hip flexion    Hip extension    Hip abduction    Hip adduction    Hip internal rotation    Hip external rotation    Knee flexion    Knee extension    Ankle dorsiflexion    Ankle plantarflexion    Ankle inversion    Ankle eversion     (Blank rows = not tested)  LOWER EXTREMITY MMT:    MMT Right Eval Left Eval  Hip flexion    Hip extension    Hip abduction    Hip adduction    Hip internal rotation    Hip external rotation    Knee flexion    Knee extension    Ankle dorsiflexion    Ankle plantarflexion    Ankle inversion    Ankle eversion    (Blank rows = not tested)  BED MOBILITY:  Sit to supine Complete Independence Supine to sit Complete Independence Rolling to Right Complete Independence Rolling to Left Complete Independence  TRANSFERS: Assistive device utilized: Environmental consultant - 4 wheeled  Sit to stand: Modified independence Stand to sit: Modified independence Chair to chair: Modified independence  GAIT: Gait pattern: decreased arm swing- Right, decreased arm swing- Left, decreased step length- Right, decreased step length- Left, Right foot flat, Left foot flat, knee flexed in stance- Right, knee flexed in stance- Left, decreased trunk rotation, trunk flexed, poor foot clearance- Right, and poor foot clearance- Left Distance walked: 530' Assistive device utilized: Environmental consultant - 4 wheeled Level of assistance: SBA   FUNCTIONAL TESTS:  5 times sit to stand: 13  sec no UE support (eval);  6 minute walk test: SpO2 before walk was 96% 530 feet with RW and one standing break. SpO2 during break wa 98%pt on 3L of O2.; 10/23/23: 1168' without AD, SpO2 before 98% and post 100% without supplemental O2.  Ankle AROM: (09/14/23)  Dorsiflexion: R = 10 deg; L = lacks 8 deg to neutral with increased supination noted in L foot.  Plantarflexion: R = 40 deg; L  = 20 deg                                                                                                                              TREATMENT:    Pt removes her O2 during PT session by choice  Forward seated lumbar flexion stretch: 3 x 10" holds Sit to stand: no HHA: 10x  Standing lumbar flexion: 3x SLS: 3 x 15" R and L Tandem stance: 2 x 30" R and L Tandem walk: fwd and bwd: 3 x 10 feet each way  Printed  out final HEP and separated her HEP in "in bed strengthening exercises", standing strengthening/balance exercises and stretching exercises. Pt educated on various ways she can pick and choose which exercises to perform. We discussed prioritizing walking program as she feels appropriate Spent time on verbally reviewing her exercises.   PATIENT EDUCATION: Education details: continue HEP especially LE stretches due to ongoing hip tightness Person educated: Patient Education method: Medical illustrator Education comprehension: verbalized understanding, returned demonstration, and needs further education  HOME EXERCISE PROGRAM: Access Code: [email or web address] URL: [email or web address] Date: 09/14/2023 Prepared by: Lavone Nian  Exercises - Supine March  - 1 x daily - 7 x weekly - 2 sets - 10 reps - Bilateral Bent Leg Lift  - 1 x daily - 7 x weekly - 2 sets - 10 reps - Supine Straight Leg Raises  - 1 x daily - 7 x weekly - 2 sets - 10 reps - Supine Bridge  - 1 x daily - 7 x weekly - 2 sets - 10 reps - Sidelying Hip Abduction  - 1 x daily - 7 x weekly - 2 sets - 10 reps - Seated  Heel Toe Raises  - 1 x daily - 7 x weekly - 2 sets - 10 reps - Seated Heel Raise  - 1 x daily - 7 x weekly - 2 sets - 10 reps - Modified Thomas Stretch  - 1 x daily - 7 x weekly - 1 sets - 3-5 reps - 30 sec hold - Standing Gastroc Stretch at Counter  - 1 x daily - 7 x weekly - 1 sets - 3-5 reps - 30 sec hold - Standing Hip Flexor Stretch  - 1 x daily - 7 x weekly - 1 sets - 3-5 reps - 30 sec hold   GOALS: Goals reviewed with patient? Yes  SHORT TERM GOALS: Target date: 10/09/2023    Patient will be 50% compliant with Hep to self manage her symptoms.  Baseline: pt alternates PT and SLP exercises daily Goal status: MET     LONG TERM GOALS: Target date: 11/06/2023    Patient will demo >700' of ambulation in 6 minutes with RW to improve walking endurance.  Baseline: 530' RW (09/11/23); 1168' without AD (10/23/23) Goal status: Goal met  2.  Patient will be able to perform SLS of >5 sec on R and L LE to improve static balance.  Baseline: 1 sec on L LE; 2 sec on R LE (09/11/23); >15 sec  Rand L (10/01/23) Goal status: goal met  3.  Pt will perform 5x sit to stand without UE support in <10 sec to improve overall functional strength.  Baseline: 13 sec without UE support (09/11/23); 13 sec without UE support (10/23/23) Goal status: not met  4.  Pt will demo 100% compliance with HEP to self manage her symptoms . Baseline: TBD Goal status: goal met    ASSESSMENT:  CLINICAL IMPRESSION: Pt has been seen for total of 11 sessions for skilled physical therapy for her gait and mobility disorder. Pt has progressed well and currently has met 3/4 long term goals. LTG#3 was not met but is within normal functional limits for the patient. Pt reports good compliance with HEP and has currently reached maximum potential from skilled physical therapy. Patient will be discharged from skilled PT with independent home exercise program.   OBJECTIVE IMPAIRMENTS: Abnormal gait, decreased activity tolerance,  decreased balance, decreased endurance, decreased mobility, difficulty walking, decreased strength, impaired flexibility, impaired tone, impaired UE  functional use, postural dysfunction, and pain.   ACTIVITY LIMITATIONS: carrying, lifting, bending, standing, squatting, stairs, transfers, and bathing  PARTICIPATION LIMITATIONS: meal prep, cleaning, laundry, driving, shopping, community activity, and yard work  PERSONAL FACTORS: Age and Time since onset of injury/illness/exacerbation are also affecting patient's functional outcome.   REHAB POTENTIAL: Good  CLINICAL DECISION MAKING: Stable/uncomplicated  EVALUATION COMPLEXITY: Low  PLAN: Discharge from skilled PT    Ileana Ladd, PT    10/25/2023, 1:12 PM

## 2023-11-07 ENCOUNTER — Ambulatory Visit: Payer: Self-pay

## 2023-11-07 NOTE — Telephone Encounter (Signed)
 Copied from CRM [id number]. Topic: Referral - Status >> Nov 07, 2023  1:11 PM Yolanda T wrote: Reason for CRM: [facility] Coordinator called to let Arna Medici know the referral needs to be resent to [facility] at [phone] as patient is a non cancer patient. Any additional question and Lawanna Kobus can be reached at [phone].

## 2023-11-08 ENCOUNTER — Other Ambulatory Visit (HOSPITAL_COMMUNITY): Payer: Self-pay | Admitting: Family Medicine

## 2023-11-08 ENCOUNTER — Ambulatory Visit (HOSPITAL_COMMUNITY)
Admission: RE | Admit: 2023-11-08 | Discharge: 2023-11-08 | Disposition: A | Discharge status: Home / Self Care | Payer: 59 | Source: Ambulatory Visit | Attending: Family Medicine | Admitting: Family Medicine

## 2023-11-08 DIAGNOSIS — R1312 Dysphagia, oropharyngeal phase: Secondary | ICD-10-CM | POA: Diagnosis not present

## 2023-11-08 DIAGNOSIS — R1310 Dysphagia, unspecified: Secondary | ICD-10-CM

## 2023-11-08 NOTE — Patient Instructions (Signed)
 Visit Information  Thank you for taking time to visit with me today. Please don't hesitate to contact me if I can be of assistance to you.   Following are the goals we discussed today:   Goals Addressed             This Visit's Progress    To establish with registered dietician for management of protein/caloric intake   On track    Care Coordination Interventions: Evaluation of current treatment plan related to caloric malnutrition w/G-tube and patient's adherence to plan as established by provider Placed outbound call to [facility] Clinical Nutritionist, confirmed the referral previously placed by [name] on 10/25/23 was not received Discussed the referral should be faxed to the outpatient non-cancer nutrition clinic at fax #[phone] Left voice message for referral coordinator for [name], requested she resend the referral to the above stated fax number ASAP Sent in basket message to [name] to advise of the referral status Spoke with patient, updated her on the Nutrition referral status and next steps Discussed plans with patient for ongoing care coordination follow up and provided patient with direct contact information for nurse care coordinator Scheduled nurse follow up call with patient for 11/26/23 @1 :30 PM     To optimize treatment of abdominal pain/spasms   Not on track    Care Coordination Interventions: Evaluation of current treatment plan related to abdominal pain/spasms and patient's adherence to plan as established by provider Spoke with patient who reports the tizanidine prescribed for abdominal pain/spasms worsened her symptoms, she reports ongoing and persistent pain especially during the nighttime when trying to sleep  Sent in basket message to [name] to advise of patient's persistent pain/spasms and requested additional recommendations Discussed plans with patient for ongoing care coordination follow up and provided  patient with direct contact information for nurse care coordinator Scheduled nurse follow up call with patient for 11/26/23 @1 :30 PM          Our next appointment is by telephone on 11/26/23 at 1:30 PM  Please call the care guide team at [phone] if you need to cancel or reschedule your appointment.   If you are experiencing a Mental Health or Behavioral Health Crisis or need someone to talk to, please call 1-800-273-TALK (toll free, 24 hour hotline)  Patient verbalizes understanding of instructions and care plan provided today and agrees to view in MyChart. Active MyChart status and patient understanding of how to access instructions and care plan via MyChart confirmed with patient.     [name] RN BSN CCM [place]  [facility], [facility] Health Nurse Care Coordinator  Direct Dial: [phone] Website: [email or web address]

## 2023-11-08 NOTE — Patient Outreach (Signed)
 Care Coordination   Follow Up Visit Note   11/07/2023 Name: Cynthia Underwood MRN: [id number] DOB: [birth year]  Cynthia Underwood is a 53 y.o. year old female who sees Cynthia Register, MD for primary care. I spoke with  Cynthia Underwood by phone today.  What matters to the patients health and wellness today?  Patient would like to establish with a Nutritionist. She would like to have reduced pain from her G-tube.     Goals Addressed             This Visit's Progress    To establish with registered dietician for management of protein/caloric intake   On track    Care Coordination Interventions: Evaluation of current treatment plan related to caloric malnutrition w/G-tube and patient's adherence to plan as established by provider Placed outbound call to [facility] Clinical Nutritionist, confirmed the referral previously placed by [name] on 10/25/23 was not received Discussed the referral should be faxed to the outpatient non-cancer nutrition clinic at fax #[phone] Left voice message for referral coordinator for [name], requested she resend the referral to the above stated fax number ASAP Sent in basket message to [name] to advise of the referral status Spoke with patient, updated her on the Nutrition referral status and next steps Discussed plans with patient for ongoing care coordination follow up and provided patient with direct contact information for nurse care coordinator Scheduled nurse follow up call with patient for 11/26/23 @1 :30 PM     To optimize treatment of abdominal pain/spasms   Not on track    Care Coordination Interventions: Evaluation of current treatment plan related to abdominal pain/spasms and patient's adherence to plan as established by provider Spoke with patient who reports the tizanidine prescribed for abdominal pain/spasms worsened her symptoms, she reports ongoing and persistent pain especially during the  nighttime when trying to sleep  Sent in basket message to [name] to advise of patient's persistent pain/spasms and requested additional recommendations Discussed plans with patient for ongoing care coordination follow up and provided patient with direct contact information for nurse care coordinator Scheduled nurse follow up call with patient for 11/26/23 @1 :30 PM      Interventions Today    Flowsheet Row Most Recent Value  Chronic Disease   Chronic disease during today's visit Other  [dysphagia,  myotonic dystrophy,  malnutrition]  General Interventions   General Interventions Discussed/Reviewed General Interventions Discussed, General Interventions Reviewed, Doctor Visits, Communication with  Doctor Visits Discussed/Reviewed Doctor Visits Reviewed, Doctor Visits Discussed, Specialist  Communication with PCP/Specialists  [[facility] clinic,  left message for [facility] referral coordinator,  in basket to [name]  Exercise Interventions   Exercise Discussed/Reviewed Weight Managment  Weight Management Weight maintenance  Education Interventions   Education Provided Provided Education  Provided Verbal Education On Nutrition, Medication, When to see the doctor  Nutrition Interventions   Nutrition Discussed/Reviewed Nutrition Discussed, Nutrition Reviewed, Supplemental nutrition, Increasing proteins  Pharmacy Interventions   Pharmacy Dicussed/Reviewed Pharmacy Topics Discussed, Pharmacy Topics Reviewed, Medications and their functions          SDOH assessments and interventions completed:  No     Care Coordination Interventions:  Yes, provided   Follow up plan: Follow up call scheduled for 11/26/23 @1 :30 PM    Encounter Outcome:  Patient Visit Completed

## 2023-11-09 ENCOUNTER — Encounter: Payer: Self-pay | Admitting: Family Medicine

## 2023-11-09 ENCOUNTER — Other Ambulatory Visit: Payer: Self-pay | Admitting: Family Medicine

## 2023-11-09 MED ORDER — BACLOFEN 10 MG PO TABS: 10.0000 mg | ORAL_TABLET | Freq: Three times a day (TID) | ORAL | 0 refills | Status: DC | PRN

## 2023-11-13 ENCOUNTER — Ambulatory Visit: Payer: Self-pay | Attending: Family Medicine | Admitting: Speech Pathology

## 2023-11-13 DIAGNOSIS — R1310 Dysphagia, unspecified: Secondary | ICD-10-CM | POA: Diagnosis not present

## 2023-11-13 NOTE — Progress Notes (Signed)
 Modified Barium Swallow Study  Patient Details  Name: Cynthia Underwood MRN: [id number] Date of Birth: 18-Dec-1970  Today's Date: 11/13/2023  Modified Barium Swallow completed.  Full report located under Chart Review in the Imaging Section.  History of Present Illness Pt is a 53 YO F with history of dysphagia, related to myonic dystrophy. PEG dependent. Followed by OP ST, with implementation of robust exercise program to address previously demonstrated physiologic deficits.   Clinical Impression The pt presents with oropharyngeal dysphagia accepting trials of thin, mildly thick, moderately thick liquids, puree, soft and regular solid. During the oral phase, some inter-labial escape to buccal cavity noted, however pt regained control of bolus and swallowed bolus. Pt also demonstrated prolonged mastication and lingual movement, with some oral residue left behind. The swallow was initiated at the level of the pyriform sinuses. During the pharyngeal stage, trace column of air was noted between the soft palate and pharyngeal wall. Minimal hyoid excursion, laryngeal elevation, and reduced epiglottic closure were also observed. These deficits resulted in trace amounts of penetration with all consistencies. Pt also demonstrated no attempt to clear unless prompted. Reduced tongue base retraction also noted. Pharyngeal residue also collected on 3+ pharyngeal structures with some residue cleared when pt was cued to complete an effortful swallow. Some distension noted with minimal/delayed UES opening and obstruction of flow. Pt also indicated some difficulty with dry swallows' d/t dry mouth and some potential fatigue. SLP recommended pt attend f/u visit at outpatient clinic to determine next steps and review compensatory strategies for swallow safety. Factors that may increase risk of adverse event in presence of aspiration [employer]oots 2021): Poor general health and/or compromised immunity;Reduced cognitive  function;Frail or deconditioned;Dependence for feeding and/or oral hygiene;Presence of tubes (ETT, trach, NG, etc.);Weak cough  Swallow Evaluation Recommendations Recommendations: Alternative means of nutrition - G Tube Liquid Administration via: Cup Medication Administration: Other (Comment) (PEG) Supervision: Patient able to self-feed Swallowing strategies  : Minimize environmental distractions;Small bites/sips;effortful swallow;Multiple dry swallows after each bite/sip;Follow solids with liquids;Clear throat intermittently Postural changes: Position pt fully upright for meals Oral care recommendations: Oral care QID (4x/day);Oral care before PO      Maia Breslow 11/13/2023,1:34 PM

## 2023-11-13 NOTE — Patient Instructions (Addendum)
 You are still at risk for aspiration.   During your swallow study, there was a little bit of material that went towards your airway. Also, after you swallow there is residue that stays in your throat.   If you choose to eat food by mouth, manage the risks by.... Small bites, small sips Swallow twice for every bite/sip to help clear throat residue  Daily oral care with brush and paste, twice a day at minimum  Take care of your overall health Stay active, get up and move around Be upright, out of bed for all intake  Continue doing your swallow exercises   SWALLOWING EXERCISES Effortful Swallows - Squeeze hard with the muscles in your neck while you swallow your  saliva or a sip of water - Repeat 20 times, 2-3 times a day, and use whenever you eat or drink  [place] Swallow - swallow with your tongue sticking out - Stick tongue out and gently bite tongue with your teeth - Swallow, while holding your tongue with your teeth - Repeat 20 times, 2-3 times a day  [employer] -  swallow as tight as you  for 5 seconds - Start to swallow, and keep your Adam's apple up by squeezing tight with the muscles of the throat - Hold the squeeze for 5-7 seconds and then relax - Repeat 20 times, 2-3 times a day        6. CTAR - Chin Tuck Against Resistance              - Place towel, ball or pool noodle under your chin             - Hold for 60 seconds 2-3x  a day             - Pulse up and down 20x 2-3x a day

## 2023-11-13 NOTE — Therapy (Signed)
 OUTPATIENT SPEECH LANGUAGE PATHOLOGY TREATMENT NOTE   Patient Name: Cynthia Underwood MRN: [id number] DOB:01-Oct-1970, 53 y.o., female Today's Date: 11/13/2023  PCP: Hoy Register, MD REFERRING PROVIDER: Hoy Register, MD  END OF SESSION:  End of Session - 11/13/23 1316     Visit Number 13    Number of Visits 17    Date for SLP Re-Evaluation 11/09/23    SLP Start Time 1316    SLP Stop Time  1400    SLP Time Calculation (min) 44 min    Activity Tolerance Patient tolerated treatment well               Past Medical History:  Diagnosis Date   Bifascicular block 10/02/2014   Cataract    Dyspnea    Excess ear wax    Multinodular goiter    Pneumonia    Presence of permanent cardiac pacemaker    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   Sickle cell trait (HCC)    Watery eyes    Left   Weakness of both legs    Past Surgical History:  Procedure Laterality Date   BREAST BIOPSY Right    EYE SURGERY Left 2017   "related to blockage in my nose"   EYE SURGERY Right 05/2019   INSERT / REPLACE / REMOVE PACEMAKER  02/15/2017   IR GASTROSTOMY TUBE MOD SED  01/01/2023   PACEMAKER IMPLANT N/A 02/15/2017   Procedure: Pacemaker Implant;  Surgeon: Duke Salvia, MD;  Location: [facility] INVASIVE CV LAB;  Service: Cardiovascular;  Laterality: N/A;   RIGHT/LEFT HEART CATH AND CORONARY ANGIOGRAPHY N/A 11/16/2022   Procedure: RIGHT/LEFT HEART CATH AND CORONARY ANGIOGRAPHY;  Surgeon: Laurey Morale, MD;  Location: [facility] INVASIVE CV LAB;  Service: Cardiovascular;  Laterality: N/A;   THYROIDECTOMY N/A 04/21/2021   Procedure: TOTAL THYROIDECTOMY;  Surgeon: Axel Filler, MD;  Location: [facility] OR;  Service: General;  Laterality: N/A;   Patient Active Problem List   Diagnosis Date Noted   Aspiration pneumonia (HCC) 05/21/2023   S/P percutaneous endoscopic gastrostomy (PEG) tube placement (HCC) 03/13/2023   Diarrhea 01/23/2023   SIRS (systemic  inflammatory response syndrome) (HCC) 01/23/2023   Chronic respiratory failure with hypoxia (HCC) 01/23/2023   Dysphagia 01/23/2023   Chronic systolic CHF (congestive heart failure) (HCC) 01/23/2023   Acute on chronic respiratory failure with hypoxia (HCC) 12/07/2022   Tracheostomy status (HCC) 12/07/2022   Generalized anxiety disorder 12/06/2022   Acute systolic heart failure (HCC) 11/26/2022   Drug-induced torsades de pointes (HCC) 11/17/2022   Acute systolic CHF (congestive heart failure) (HCC) 11/16/2022   Acute hypoxemic respiratory failure (HCC) 11/15/2022   Acute on chronic combined systolic and diastolic CHF (congestive heart failure) (HCC) 11/15/2022   Protein-calorie malnutrition, severe 11/15/2022   Cardiac arrest (HCC) 11/14/2022   Healthcare-associated pneumonia 11/11/2022   Hypothyroidism 11/07/2021   Thyroid nodule 04/21/2021   S/P total thyroidectomy 04/21/2021   Sinus node dysfunction (HCC) 03/10/2020   Weakness of both legs    Watery eyes    Sickle cell trait (HCC)    Presence of permanent cardiac pacemaker    Excess ear wax    Depression    Anemia    Cardiac pacemaker in situ 03/01/2017   Mobitz type 2 second degree heart block 02/15/2017   Symptomatic advanced heart block 02/12/2017   SOB (shortness of breath) 10/02/2014   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   Bifascicular block 10/02/2014  Infection due to trichomonas vaginalis 09/15/2014   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   Accompanied by: self  PERTINENT HISTORY: myotonic dystophy, hypothyroidism, anxiety. PEG dependence.   PAIN:  Are you having pain? No  FALLS: Has patient fallen in last 6 months?  No  LIVING ENVIRONMENT: Lives with: lives with their family Lives in: House/apartment  PLOF:  Level of assistance: Independent with ADLs, Independent with IADLs Employment: On disability  PATIENT GOALS: Ability to tolerate p.o.  OBJECTIVE:  Note: Objective measures were completed at Evaluation unless otherwise noted. OBJECTIVE:   INSTRUMENTAL SWALLOW STUDY FINDINGS (MBSS) 06/18/2023 Clinical Impression Pt continues to exhibit signifcant pharyngoesophageal dysphagia marked by reduced tongue base retraction, decreased laryngeal elevation, no epiglottic deflection and reduced anterior hyoid excursion. Majority of boluses remained in valleculae and pyriform sinuses with reduced UES opening. Thin liquid with teaspoon penetrated near cord level, cups sip thin penetrated after the swallow (PAS 3), nectar thick penetrated from residue and puree while performing a chin tuck spilled into airway (PAS 3). Pt did throat clear several times throughout study, however overall sensation was reduced. She demonstrated significant difficulty initiating a second swallow stating that her mouth was dry in attempts to clear residue. A left and right head turn and dry swallows were not effective. Esophageal scan revealed stasis in distal esophagus. Recommend pt continue NPO status and use PEG for nutrition. She may have sips of thin water (only) after oral care, however pt states that she does not like water. She also states that she gets hungry despite her tube feedings. Pt and cousin report she has not been having home health ST and SLP recommends home health ST to work on pharyngeal strength.  COGNITION: Overall cognitive status: Within functional limits for tasks assessed  SUBJECTIVE DYSPHAGIA REPORTS:  Date of onset: March  2024 Reported symptoms: coughing with both solids and liquids, globus sensation, xerostomia, shortness of breath with PO, and weak voice  Current diet:  PEG dependent, thin liquid (water) PO  Co-morbid voice changes: Yes  Weight Loss: 20 lbs   FACTORS WHICH MAY INCREASE RISK OF ADVERSE EVENT IN PRESENCE OF ASPIRATION:  General health: frail or deconditioned  Risk factors: reduced respiratory function, weak cough, GERD or other GI disease, and tube present (PEG)     ORAL MOTOR EXAMINATION: Overall status: Impaired: overall reduced strength evidenced  CLINICAL SWALLOW ASSESSMENT:   Dentition: dentures (upper), some natural dentition on bottom -- eats without teeth  Vocal quality at baseline: hoarse, low vocal intensity, and vocal fatigue Patient directly observed with POs: Yes: thin liquids  Feeding: able to feed self Liquids provided by: teaspoon and cup Yale Swallow Protocol: Pass Oral phase signs and symptoms: anterior loss/spillage Pharyngeal phase signs and symptoms: multiple swallows, wet vocal quality, delayed throat clear, and delayed cough  PATIENT REPORTED OUTCOME MEASURES (PROM): deferred   TODAY'S TREATMENT:  11-13-23: SLP reviewed MBSS findings with pt. Provided patient education regarding recommendation for ongoing utilization of PEG for primary nutrition. Advised of risk of aspiration and potential sequela. Discussed how to mitigate aspiration risk via positioning, swallow strategies, oral care, and  general upkeep of health & activity. Following pt education, pt able to teach back salient points with exception of need for multiple swallows per bolus d/t pharyngeal residue. Re-education provided to reinforce. Reviewed swallow HEP, with pt able to demonstrate all exercises. Recommend pt continue to engage in dysphagia HEP despite d/c to optimize retention of swallow function.   10-23-23: Pt reported occasional completion of effortful swallows at home, but minimal practice with  other exercises in HEP. ST reviewed each exercise on patient's HEP to facilitate pt understanding of expectations and ensure exercises are completed appropriately. ST initiated effortful swallows and pt completed 50 in total given min-A. Pt demonstrated x10 Mendelson exercises and 10x Masako swallow with max/mod-A, a model and verbal prompting. ST initiated pt demonstration of positioning and recall of CTAR with pt completing demonstration given min-A and requiring clarification regarding repetitions recommended for daily practice. SLP provided handout and exercise schedule to aid continued practice of HEP, and ensure exercises are completed appropriately and on a regular basis. Scheduled follow up appointment for after MBS. Will revisit POC at that time based on instrumental findings.   10-16-23: Reported difficulty with tongue tip elevation/protrusion while completing Masako. Endorsed completing 2-3 reps at a time prior to needing break. Initiated education and instruction of tongue press x20 to increase lingual strength with rare min A required. Pt reported limited pharyngeal muscle engagement during tongue press d/t wearing dentures. Recommended including tongue press into HEP. Reviewed remaining swallow exercises. Demonstrated Mendelsohn exercises x10 with extra time, effortful swallows x10 with small, single cup sips, Masako swallows x10, CTAR isokinetic x10, and CTAR isometric 60 seconds x3. Provided MBSS scheduling number to call to schedule swallow study.   10/12/23: ST guided pt through Shaker exercises, pt completed 2 sets of 10 with min assist, and completed chin tuck hold for 25 seconds w/out additional support. Pt shared no practice with exercises over the past week d/t travel and ST reminded pt of the necessity for daily practice. ST initiated practice with effortful swallows with apple sauce to target strengthened, functional swallowing with a bolus. Pt participated effectively with min/mod cues. ST  then reviewed and facilitated Mendelson Maneuver technique and Masako swallow exercise. Pt completed 15 trials of Mendelson with min-A, and required additional cues for the [facility] exercise.  ST inquired with pt regarding follow up MBS to further assess swallow and progress made in therapy, pt agreed. Exercise tracker provided for increased compliance with HEP, pt to fill out daily and bring with to next appointment.   10/05/23: Pt reported difficulty completing masako swallows and 50x effortful swallows each day d/t stomach pains from drinking water. SLP reminded pt effortful swallows can be completed dry with out taking sips, and pt agreed to completing sufficient practice at home. SLP facilitated Shaker exercise to target increased strength/control of pharyngeal muscles and pt participated successfully. Pt demonstrated increased strength performing the exercise with min-A and held positioning for the technique for ~15 seconds without support from SLP.    10/02/23: Discussed swallow HEP, in which pt reports completing effortful swallows at home. Not completing specific number per patient report; reportedly completing effortful swallows with sips of water throughout day. Recommended aiming for effortful swallows 50x/day at minimum. Reviewed importance of maximal effort to achieve greatest benefit during effortful swallows. With additional review of HEP in patient instructions, pt endorsed completing Mendelsohn and CTAR. Denied difficulty or need for review. Introduced Masako swallow today to address reduced BOT retraction. Modified exercise to elevate tongue tip  up versus protrusion out d/t difficulty demonstrated. Pt completed effortful swallows x10 with tsp sips of water and modified masakos x7. Intermittent delayed coughing noted; cued strong cough to clear. Updated HEP to include 10 Masako swallows per day. Denied additional questions.   PATIENT EDUCATION: Education details: see above Person educated:  Patient and Caregiver cousin Education method: Explanation, Demonstration, Verbal cues, and Handouts Education comprehension: verbalized understanding, returned demonstration, and needs further education   ASSESSMENT:  CLINICAL IMPRESSION: Patient is a 53 y.o. F who was seen today for dysphagia treatment session. Pt mostly compliant with HEP. Evidencing mod-I for prescribed exercises during therapy session. Reduced insight into deficits evidenced. Continues to benefit from skilled ST to address dysphagia to improve QoL. MBSS  completed March 6th shows modest improvements but ongoing recommendation for continuing to utilize PEG as primary nutrition source. Pt appears to have maxmized rehab potential, agreeable to d/c. Recommend ongoing HEP completion.   OBJECTIVE IMPAIRMENTS: include dysphagia. These impairments are limiting patient from safety when swallowing. Factors affecting potential to achieve goals and functional outcome are severity of impairments. Patient will benefit from skilled SLP services to address above impairments and improve overall function.  REHAB POTENTIAL: Good   GOALS: Goals reviewed with patient? Yes  SHORT TERM GOALS: Target date: 09/14/2023   Pt will report daily HEP completion over 1 week period  Baseline: Goal status: MET  2.  Pt will accurately demonstrate dysphagia exercises with min-A Baseline:  Goal status: MET  3.  Pt will increase oral care BID over one week period Baseline:  Goal status: MET   LONG TERM GOALS: Target date:11/09/2023 (+4 weeks for recert)   Pt will be independent with dysphagia HEP Baseline:  Goal status: MET  2.  Pt will complete MBSS  Baseline: 11/08/2023 Goal status: MET  3.  Pt will utilize swallow compensations and strategies to support PO consumption of least restrictive diet based on function demonstrated on repeat instrumental swallow study Baseline:  Goal status: MET   PLAN:  SLP FREQUENCY: 2x/week  SLP  DURATION: 8 weeks + 4 weeks for recert, 12 weeks total   PLANNED INTERVENTIONS: 92526 Treatment of swallowing function, Re-evaluation, Aspiration precaution training, Pharyngeal strengthening exercises, Diet toleration management , Trials of upgraded texture/liquids, SLP instruction and feedback, Compensatory strategies, and Patient/family education   [place], Student-SLP 11/13/2023, 1:16 PM   SPEECH THERAPY DISCHARGE SUMMARY  Visits from Start of Care: 13  Current functional level related to goals / functional outcomes: Design and implementation of robust HEP targeting physiologic deficits. Pt is mod-I with prescribed exercises and precautions.    Remaining deficits: Moderate oropharyngeal dysphagia, PEG dependence    Education / Equipment: Safe swallow strategies, aspiration precautions, pharyngeal strengthening exercises, HEP   Patient agrees to discharge. Patient goals were met. Patient is being discharged due to maximized rehab potential.

## 2023-11-19 ENCOUNTER — Ambulatory Visit (INDEPENDENT_AMBULATORY_CARE_PROVIDER_SITE_OTHER): Payer: Self-pay

## 2023-11-19 DIAGNOSIS — I441 Atrioventricular block, second degree: Secondary | ICD-10-CM | POA: Diagnosis not present

## 2023-11-20 LAB — CUP PACEART REMOTE DEVICE CHECK
Battery Remaining Longevity: 36 mo
Battery Voltage: 2.94 V
Brady Statistic AP VP Percent: 74.58 %
Brady Statistic AP VS Percent: 0 %
Brady Statistic AS VP Percent: 25.42 %
Brady Statistic AS VS Percent: 0 %
Brady Statistic RA Percent Paced: 74.55 %
Brady Statistic RV Percent Paced: 100 %
Date Time Interrogation Session: 20250317020244
Implantable Lead Connection Status: 753985
Implantable Lead Connection Status: 753985
Implantable Lead Implant Date: 20180614
Implantable Lead Implant Date: 20180614
Implantable Lead Location: 753859
Implantable Lead Location: 753860
Implantable Lead Model: 3830
Implantable Lead Model: 5076
Implantable Pulse Generator Implant Date: 20180614
Lead Channel Impedance Value: 323 Ohm
Lead Channel Impedance Value: 380 Ohm
Lead Channel Impedance Value: 418 Ohm
Lead Channel Impedance Value: 532 Ohm
Lead Channel Pacing Threshold Amplitude: 0.75 V
Lead Channel Pacing Threshold Amplitude: 1.375 V
Lead Channel Pacing Threshold Pulse Width: 0.4 ms
Lead Channel Pacing Threshold Pulse Width: 0.4 ms
Lead Channel Sensing Intrinsic Amplitude: 2.75 mV
Lead Channel Sensing Intrinsic Amplitude: 2.75 mV
Lead Channel Sensing Intrinsic Amplitude: 4.125 mV
Lead Channel Sensing Intrinsic Amplitude: 4.125 mV
Lead Channel Setting Pacing Amplitude: 2 V
Lead Channel Setting Pacing Amplitude: 2 V
Lead Channel Setting Pacing Pulse Width: 1 ms
Lead Channel Setting Sensing Sensitivity: 0.9 mV
Zone Setting Status: 755011
Zone Setting Status: 755011

## 2023-11-26 ENCOUNTER — Ambulatory Visit: Payer: Self-pay

## 2023-11-26 NOTE — Patient Outreach (Signed)
 Care Coordination   Follow Up Visit Note   11/26/2023 Name: Cynthia Underwood MRN: [id number] DOB: July 22, 1971  Cynthia Underwood is a 53 y.o. year old female who sees Hoy Register, MD for primary care. I spoke with sister Dellanira Dillow by phone today.  What matters to the patients health and wellness today?  Sister [name] would like to check to see if the patient scheduled her initial visit with the [facility] Nutritionist.     Goals Addressed             This Visit's Progress    To be able to eat and drink normally and regain my speech   On track    Care Coordination Interventions: Completed outbound call with sister Chrystie Hagwood  Evaluation of current treatment plan related to Impaired Speech and Swallowing and patient's adherence to plan as established by provider Discussed and reviewed ST plan of care with sister [name], discussed patient has been discharged from this service due to meeting all goals 11-13-23: SLP reviewed MBSS findings with pt. Provided patient education regarding recommendation for ongoing utilization of PEG for primary nutrition. Advised of risk of aspiration and potential sequela. Discussed how to mitigate aspiration risk via positioning, swallow strategies, oral care, and general upkeep of health & activity. Following pt education, pt able to teach back salient points with exception of need for multiple swallows per bolus d/t pharyngeal residue. Re-education provided to reinforce. Reviewed swallow HEP, with pt able to demonstrate all exercises. Recommend pt continue to engage in dysphagia HEP despite d/c to optimize retention of swallow function.  Discussed with sister [name], patient continues to administer Jevity via her PEG tube without difficulty  Discussed sister, patient continues to adhere to her ST HEP as directed  Reviewed and discussed patients next scheduled follow up with [name] is scheduled for 02/20/24 @4 :10 PM Discussed plans with patient for  ongoing care coordination follow up with Juanell Fairly RN and provided patient with direct contact information for nurse care coordinator Scheduled nurse follow up call with patient for 12/25/23 @3 :00 PM     To establish with registered dietician for management of protein/caloric intake   On track    Care Coordination Interventions: Placed successful call to sister [name] Evaluation of current treatment plan related to caloric malnutrition w/G-tube and patient's adherence to plan as established by provider Discussed sister [name] is unsure if patient scheduled her initial nutritionist visit, she will check with her and encourage her to do so if needed  Discussed plans with patient for ongoing care coordination follow up with Juanell Fairly RN and provided patient with direct contact information for nurse care coordinator Scheduled nurse follow up call with patient for 12/25/23 @3 :00 PM     To optimize treatment of abdominal pain/spasms   On track    Care Coordination Interventions: Completed successful outbound call with sister Izzy Doubek Evaluation of current treatment plan related to abdominal pain/spasms and patient's adherence to plan as established by provider Reviewed and discussed with sister [name], patient's next upcoming scheduled follow up with [facility] scheduled for 12/04/23 @2 :30 PM Discussed plans with patient for ongoing care coordination follow up with Juanell Fairly RN  Scheduled nurse follow up call with patient for 12/25/23 @3 :00 PM      Interventions Today    Flowsheet Row Most Recent Value  Chronic Disease   Chronic disease during today's visit Other  [dsyphagia,  impaired speech,  impaired physical mobility]  General Interventions  General Interventions Discussed/Reviewed General Interventions Discussed, General Interventions Reviewed, Doctor Visits, Communication with, Labs  Doctor Visits Discussed/Reviewed Doctor Visits Discussed, Doctor Visits Reviewed  Communication with  RN  Juanell Fairly RN]  Exercise Interventions   Exercise Discussed/Reviewed Physical Activity, Exercise Reviewed, Exercise Discussed, Assistive device use and maintanence  Physical Activity Discussed/Reviewed Physical Activity Discussed, Physical Activity Reviewed, Home Exercise Program (HEP)  Education Interventions   Education Provided Provided Education  Provided Verbal Education On Nutrition, Medication, Exercise, When to see the doctor  Nutrition Interventions   Nutrition Discussed/Reviewed Nutrition Discussed, Nutrition Reviewed  Pharmacy Interventions   Pharmacy Dicussed/Reviewed Pharmacy Topics Discussed, Pharmacy Topics Reviewed  Safety Interventions   Safety Discussed/Reviewed Safety Discussed, Safety Reviewed, Fall Risk, Home Safety  Home Safety Assistive Devices          SDOH assessments and interventions completed:  No     Care Coordination Interventions:  Yes, provided   Follow up plan: Follow up call scheduled for 12/25/23 @2 :00 PM    Encounter Outcome:  Patient Visit Completed

## 2023-11-26 NOTE — Patient Instructions (Signed)
 Visit Information  Thank you for taking time to visit with me today. Please don't hesitate to contact me if I can be of assistance to you.   Following are the goals we discussed today:   Goals Addressed             This Visit's Progress    To be able to eat and drink normally and regain my speech   On track    Care Coordination Interventions: Completed outbound call with sister Lyliana Dicenso  Evaluation of current treatment plan related to Impaired Speech and Swallowing and patient's adherence to plan as established by provider Discussed and reviewed ST plan of care with sister [name], discussed patient has been discharged from this service due to meeting all goals 11-13-23: SLP reviewed MBSS findings with pt. Provided patient education regarding recommendation for ongoing utilization of PEG for primary nutrition. Advised of risk of aspiration and potential sequela. Discussed how to mitigate aspiration risk via positioning, swallow strategies, oral care, and general upkeep of health & activity. Following pt education, pt able to teach back salient points with exception of need for multiple swallows per bolus d/t pharyngeal residue. Re-education provided to reinforce. Reviewed swallow HEP, with pt able to demonstrate all exercises. Recommend pt continue to engage in dysphagia HEP despite d/c to optimize retention of swallow function.  Discussed with sister [name], patient continues to administer Jevity via her PEG tube without difficulty  Discussed sister, patient continues to adhere to her ST HEP as directed  Reviewed and discussed patients next scheduled follow up with [name] is scheduled for 02/20/24 @4 :10 PM Discussed plans with patient for ongoing care coordination follow up with Juanell Fairly RN and provided patient with direct contact information for nurse care coordinator Scheduled nurse follow up call with patient for 12/25/23 @3 :00 PM     To establish with registered dietician for  management of protein/caloric intake   On track    Care Coordination Interventions: Placed successful call to sister [name] Evaluation of current treatment plan related to caloric malnutrition w/G-tube and patient's adherence to plan as established by provider Discussed sister [name] is unsure if patient scheduled her initial nutritionist visit, she will check with her and encourage her to do so if needed  Discussed plans with patient for ongoing care coordination follow up with Juanell Fairly RN and provided patient with direct contact information for nurse care coordinator Scheduled nurse follow up call with patient for 12/25/23 @3 :00 PM     To optimize treatment of abdominal pain/spasms   On track    Care Coordination Interventions: Completed successful outbound call with sister Madeleine Fenn Evaluation of current treatment plan related to abdominal pain/spasms and patient's adherence to plan as established by provider Reviewed and discussed with sister [name], patient's next upcoming scheduled follow up with [facility] scheduled for 12/04/23 @2 :30 PM Discussed plans with patient for ongoing care coordination follow up with Juanell Fairly RN  Scheduled nurse follow up call with patient for 12/25/23 @3 :00 PM          Our next appointment is by telephone on 12/25/23 at 3:00 PM  Please call the care guide team at [phone] if you need to cancel or reschedule your appointment.   If you are experiencing a Mental Health or Behavioral Health Crisis or need someone to talk to, please call 1-800-273-TALK (toll free, 24 hour hotline)  Patient verbalizes understanding of instructions and care plan provided today and agrees to view in MyChart. Active  MyChart status and patient understanding of how to access instructions and care plan via MyChart confirmed with patient.     [name] RN BSN CCM [place]  [facility], [facility] Nurse Care Coordinator  Direct Dial:  [phone] Website: [email or web address]

## 2023-11-30 ENCOUNTER — Other Ambulatory Visit: Payer: Self-pay | Admitting: Family Medicine

## 2023-12-04 ENCOUNTER — Encounter: Payer: Self-pay | Admitting: Physician Assistant

## 2023-12-04 ENCOUNTER — Ambulatory Visit (INDEPENDENT_AMBULATORY_CARE_PROVIDER_SITE_OTHER): Payer: 59 | Admitting: Physician Assistant

## 2023-12-04 VITALS — HR 80 | Ht 63.0 in | Wt 93.0 lb

## 2023-12-04 DIAGNOSIS — Z931 Gastrostomy status: Secondary | ICD-10-CM | POA: Diagnosis not present

## 2023-12-04 DIAGNOSIS — Z860101 Personal history of adenomatous and serrated colon polyps: Secondary | ICD-10-CM | POA: Diagnosis not present

## 2023-12-04 DIAGNOSIS — E46 Unspecified protein-calorie malnutrition: Secondary | ICD-10-CM | POA: Diagnosis not present

## 2023-12-04 NOTE — Progress Notes (Signed)
 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 Lavon Paganini, who presents to clinic today for follow-up of her malnutrition and G-tube.    10/07/2019 colonoscopy with two 1-2 mm polyps in transverse and ascending colon one 10 mm polyp in the ascending colon as well as nonbleeding internal hemorrhoids.  Pathology showed adenomatous polyps.  Repeat recommended 3 years.    05/20/2023-05/25/2023 patient admitted to the hospital for aspiration pneumonia.  At that time discharged with n.p.o. by mouth and continue tube feedings.    05/22/2023 modified barium swallow study time they recommended n.p.o. with only feeding through the G-tube.    06/12/2023 patient seen in clinic by me and at that time having trouble given that a G-tube was placed and they were never advised on nutrition excetra.  They are trying to get her to gain weight and also wondered if she would ever get the G-tube removed.  At that point provided them with a phone number for the nutrition at [facility] as well as speech therapy/pathology to arrange home health visits, explained that we did not manage G-tubes or feedings.  We decided to wait on colonoscopy and have her follow-up in 6 months to rediscuss surveillance colonoscopy.    08/09/2023 CTAP with contrast for pain at the PEG tube insertion site with percutaneous gastrostomy tube in place in the body of the stomach, stable dense consolidation bilateral lobes, renal cyst otherwise normal.    11/08/2023 modified barium swallow with speech pathology at that time they recommended follow-up visit in outpatient clinic to  determine next best steps.    Today, the patient has gained 8 pounds since her last visit here.  She tells me she saw speech pathology and is able to eat small bites of actual real food just not a lot.  She continues with G-tube feedings though is not sure who is actually managing her prescription for nutrition.  Apparently never followed with a nutritionist.  Her main complaint for me today is that she is having pain around the G-tube site worse if she sits up or moves around, better if she lays down.  Apparently this pain has been there for 1 to 2 months.  She is still on oxygen.    Denies fever, chills or weight loss.  Past Medical History:  Diagnosis Date   Bifascicular block 10/02/2014   Cataract    Dyspnea    Excess ear wax    Multinodular goiter    Pneumonia    Presence of permanent cardiac pacemaker    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   Sickle cell trait (HCC)    Watery eyes    Left   Weakness of both legs     Past Surgical History:  Procedure Laterality Date   BREAST BIOPSY Right    EYE SURGERY Left 2017   "related to blockage in my nose"   EYE SURGERY Right 05/2019   INSERT / REPLACE / REMOVE PACEMAKER  02/15/2017   IR GASTROSTOMY TUBE MOD SED  01/01/2023   PACEMAKER IMPLANT N/A 02/15/2017   Procedure: Pacemaker Implant;  Surgeon: Duke Salvia, MD;  Location: [facility] INVASIVE CV LAB;  Service: Cardiovascular;  Laterality: N/A;   RIGHT/LEFT HEART CATH AND CORONARY ANGIOGRAPHY N/A 11/16/2022   Procedure: RIGHT/LEFT HEART CATH AND CORONARY ANGIOGRAPHY;  Surgeon: Laurey Morale, MD;  Location: [facility] INVASIVE CV LAB;  Service: Cardiovascular;  Laterality: N/A;   THYROIDECTOMY N/A 04/21/2021   Procedure: TOTAL THYROIDECTOMY;  Surgeon: Axel Filler, MD;  Location: [facility] OR;  Service: General;  Laterality: N/A;    Current Outpatient Medications  Medication Sig Dispense Refill   acetaminophen (TYLENOL) 500 MG tablet Place 500 mg into  feeding tube every 6 (six) hours as needed for mild pain.     guaifenesin (HUMIBID E) 400 MG TABS tablet 400 mg 3 (three) times daily. Place 400mg  into feeding tube three times a day     ipratropium-albuterol (DUONEB) 0.5-2.5 (3) MG/3ML SOLN Inhale 3 mLs into the lungs every 4 (four) hours as needed. SMARTSIG:1 Unspecified Via Inhaler Every 4 Hours PRN 360 mL 3   levothyroxine (SYNTHROID) 100 MCG tablet PLACE 1 TABLET (100 MCG TOTAL) INTO FEEDING TUBE DAILY BEFORE BREAKFAST. 30 tablet 0   metoprolol tartrate (LOPRESSOR) 25 MG tablet Place 0.5 tablets (12.5 mg total) into feeding tube 2 (two) times daily. 180 tablet 0   Misc. Devices MISC Eval & titrate pt for poc for portable oxygen container. If patient qualifies, dispense at 1-6 pulse dose. Please fax to [phone]. Diagnosis-aspiration pneumonia 1 each 0   Misc. Devices MISC Shower chair.  Diagnosis myotonic dystrophy 1 each 0   Nutritional Supplements (FEEDING SUPPLEMENT, JEVITY 1.5 CAL/FIBER,) LIQD Place 237 mLs into feeding tube 4 (four) times daily. FWF before and after each feeding 1000 mL 30   spironolactone (ALDACTONE) 25 MG tablet PLACE 0.5 TABLETS (12.5 MG TOTAL) INTO FEEDING TUBE DAILY. 45 tablet 1   Water For Irrigation, Sterile (FREE WATER) SOLN free water via tube before and after each feeding     No current facility-administered medications for this visit.    Allergies as of 12/04/2023 - Review Complete 12/04/2023  Allergen Reaction Noted   Penicillin g Nausea And Vomiting, Rash, and Other (See Comments) 07/27/2015   Penicillins Nausea And Vomiting, Rash, and Other (See Comments) 05/16/2014    Family History  Problem Relation Age of Onset   Cancer Father 77       Deceased   Heart disease Father    COPD Mother    Diabetes Maternal Uncle    Heart disease Maternal Uncle    Heart disease Paternal Grandmother    Diabetes Paternal Grandmother    Hypertension Paternal Grandmother    Neuromuscular disorder  Maternal Aunt        Myotonic dystrophy   Neuromuscular disorder Cousin        Myotonic dystrophy   Neuromuscular disorder Sister        Myotonic dystrophy   Colon cancer Neg Hx    Colon polyps Neg Hx    Esophageal cancer Neg Hx    Rectal cancer Neg Hx    Stomach cancer Neg Hx     Social History   Socioeconomic History   Marital status: Single    Spouse name: Not on file   Number of children: 0   Years of education: 1.5 Collge   Highest education level: Not on file  Occupational History   Occupation: Unemployed    Employer: OTHER  Tobacco Use   Smoking status: Former    Current packs/day: 0.00    Types: Cigarettes  Start date: 10/04/1988    Quit date: 10/04/2016    Years since quitting: 7.1   Smokeless tobacco: Never  Vaping Use   Vaping status: Never Used  Substance and Sexual Activity   Alcohol use: Not Currently    Alcohol/week: 0.0 standard drinks of alcohol    Comment:  few times a year   Drug use: Not Currently    Types: Marijuana    Comment: occ    Sexual activity: Not Currently    Birth control/protection: None, Condom  Other Topics Concern   Not on file  Social History Narrative   Patient lives at home with her aunt.   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  She moved to [facility] in August 2015.   She has been on disability since 2010.   Education: 1 1/2 years of college.   Caffeine - coke 2 cans/day   Exercise - no   Social Drivers of Corporate investment banker Strain: Low Risk  (08/22/2023)   Overall Financial Resource Strain (CARDIA)    Difficulty of Paying Living Expenses: Not hard at all  Food Insecurity: No Food Insecurity (08/22/2023)   Hunger Vital Sign    Worried About Running Out of Food in the Last Year: Never true    Ran Out of Food in the Last Year: Never true  Transportation Needs: No Transportation Needs (08/22/2023)   PRAPARE - Administrator, Civil Service (Medical): No    Lack of Transportation  (Non-Medical): No  Physical Activity: Inactive (08/22/2023)   Exercise Vital Sign    Days of Exercise per Week: 0 days    Minutes of Exercise per Session: 0 min  Stress: No Stress Concern Present (08/22/2023)   [employer] of Occupational Health - Occupational Stress Questionnaire    Feeling of Stress : Not at all  Social Connections: Moderately Isolated (08/22/2023)   Social Connection and Isolation Panel [NHANES]    Frequency of Communication with Friends and Family: Three times a week    Frequency of Social Gatherings with Friends and Family: Twice a week    Attends Religious Services: Never    Database administrator or Organizations: Yes    Attends Banker Meetings: Never    Marital Status: Never married  Intimate Partner Violence: Not At Risk (08/22/2023)   Humiliation, Afraid, Rape, and Kick questionnaire    Fear of Current or Ex-Partner: No    Emotionally Abused: No    Physically Abused: No    Sexually Abused: No    Review of Systems:    Constitutional: No weight loss, fever or chills Cardiovascular: No chest pain, chest pressure or palpitations   Respiratory:+on O2 Gastrointestinal: See HPI and otherwise negative   Physical Exam:  Vital signs: Pulse 80   Ht 5\' 3"  (1.6 m)   Wt 93 lb (42.2 kg)   LMP 12/03/2016 (Approximate)   BMI 16.47 kg/m   (unable to hear BP)  Constitutional:   Pleasant thin, cachectic AA female appears to be in NAD, Well developed, Well nourished, alert and cooperative Respiratory: Respirations even and unlabored. Lungs clear to auscultation bilaterally.   No wheezes, crackles, or rhonchi. On O2 via [place] Cardiovascular: Normal S1, S2. No MRG. Regular rate and rhythm. No peripheral edema, cyanosis or pallor.  Gastrointestinal:  Soft, nondistended, nontender. No rebound or guarding. Normal bowel sounds. No appreciable masses or hepatomegaly. +Gtube with no erythema or pain to palpation externally around drain, some  more pain to left  of g tube with palpation and in epigastrum Psychiatric: Oriented to person, place and time. Demonstrates good judgement and reason without abnormal affect or behaviors.  RELEVANT LABS AND IMAGING: CBC    Component Value Date/Time   WBC 9.3 08/09/2023 2038   RBC 4.45 08/09/2023 2038   HGB 14.0 08/09/2023 2038   HGB 12.6 10/04/2022 1638   HCT 43.1 08/09/2023 2038   HCT 37.6 10/04/2022 1638   PLT PLATELET CLUMPS NOTED ON SMEAR, UNABLE TO ESTIMATE 08/09/2023 2038   PLT 192 10/04/2022 1638   MCV 96.9 08/09/2023 2038   MCV 94 10/04/2022 1638   MCH 31.5 08/09/2023 2038   MCHC 32.5 08/09/2023 2038   RDW 14.5 08/09/2023 2038   RDW 15.1 10/04/2022 1638   LYMPHSABS 1.9 08/09/2023 2038   LYMPHSABS 2.1 10/04/2022 1638   MONOABS 0.4 08/09/2023 2038   EOSABS 0.1 08/09/2023 2038   EOSABS 0.1 10/04/2022 1638   BASOSABS 0.0 08/09/2023 2038   BASOSABS 0.0 10/04/2022 1638    CMP     Component Value Date/Time   NA 141 08/09/2023 2038   NA 143 10/04/2022 1638   K 4.8 08/09/2023 2038   CL 104 08/09/2023 2038   CO2 28 08/09/2023 2038   GLUCOSE 103 (H) 08/09/2023 2038   BUN 12 08/09/2023 2038   BUN 12 10/04/2022 1638   CREATININE 0.54 08/09/2023 2038   CREATININE 0.74 02/14/2018 1225   CALCIUM 9.6 08/09/2023 2038   PROT 7.5 08/09/2023 2038   PROT 7.1 10/04/2022 1638   ALBUMIN 3.6 08/09/2023 2038   ALBUMIN 4.7 10/04/2022 1638   AST 34 08/09/2023 2038   AST 26 02/14/2018 1225   ALT 21 08/09/2023 2038   ALT 15 02/14/2018 1225   ALKPHOS 51 08/09/2023 2038   BILITOT 0.9 08/09/2023 2038   BILITOT 0.5 10/04/2022 1638   BILITOT 0.3 02/14/2018 1225   GFRNONAA >60 08/09/2023 2038   GFRNONAA >60 02/14/2018 1225   GFRAA 92 09/11/2018 1132   GFRAA >60 02/14/2018 1225    Assessment: 1.  G-tube pain: Patient tells me this is only been going on for 2 months but it looks like she had a CT for the same reason back in December with no cause seen; consider gastritis/irritation from G-tube insertion  site internally versus gastritis versus other 2.  Malnutrition: Patient is actually gained 8 pounds since being seen last 3.  History of adenomatous polyps: Overdue for repeat colonoscopy, if this were pursued would need to be in the hospital given her deconditioning and oxygen use  Plan: 1.  Will discuss case with Dr. Lavon Paganini.  Patient may benefit from an EGD for further evaluation given ongoing pain around G-tube insertion site.  She has already had a CT in December which I reviewed.  She is also due for a surveillance colonoscopy, but is quite deconditioned, weak and on oxygen. 2.  For now continue what she is doing.  Could consider PPI through G-tube to possibly help with irritation. 3.  Patient to follow in clinic per recommendations after Dr. Lavon Paganini review his case.  Hyacinth Meeker, PA-C [facility] 12/04/2023, 2:21 PM  Cc: Hoy Register, MD

## 2023-12-04 NOTE — Patient Instructions (Signed)
 _______________________________________________________  If your blood pressure at your visit was 140/90 or greater, please contact your primary care physician to follow up on this.  _______________________________________________________  If you are age 55 or older, your body mass index should be between 23-30. Your Body mass index is 16.47 kg/m. If this is out of the aforementioned range listed, please consider follow up with your Primary Care Provider.  If you are age 40 or younger, your body mass index should be between 19-25. Your Body mass index is 16.47 kg/m. If this is out of the aformentioned range listed, please consider follow up with your Primary Care Provider.   ________________________________________________________  The [facility] providers would like to encourage you to use [facility] to communicate with providers for non-urgent requests or questions.  Due to long hold times on the telephone, sending your provider a message by [facility] may be a faster and more efficient way to get a response.  Please allow 48 business hours for a response.  Please remember that this is for non-urgent requests.  _______________________________________________________  Thank you for trusting me with your gastrointestinal care!   Vicente Serene, PA

## 2023-12-06 ENCOUNTER — Telehealth: Payer: Self-pay

## 2023-12-06 ENCOUNTER — Other Ambulatory Visit: Payer: Self-pay

## 2023-12-06 DIAGNOSIS — Z931 Gastrostomy status: Secondary | ICD-10-CM

## 2023-12-06 DIAGNOSIS — E46 Unspecified protein-calorie malnutrition: Secondary | ICD-10-CM

## 2023-12-06 DIAGNOSIS — R1010 Upper abdominal pain, unspecified: Secondary | ICD-10-CM

## 2023-12-06 NOTE — Telephone Encounter (Signed)
 Referral to IR for evaluation and management of the feeding tube. Patient is aware.  EGD scheduled for 02/19/24 and instructions will be mailed. Patient is aware of this also.

## 2023-12-06 NOTE — Telephone Encounter (Signed)
-----   Message from Unk Lightning sent at 12/06/2023 12:11 PM EDT ----- Regarding: FW: complicated Please see Dr. Elana Alm note below.  Patient needs clinic IR eval for G-tube management and on EGD at the hospital with her at next available appointment.  Thanks, JL L ----- Message ----- From: Napoleon Form, MD Sent: 12/06/2023  11:50 AM EDT To: Unk Lightning, PA Subject: RE: complicated                                Patient will need to follow-up with nutrition and also will need follow-up IR for G- tube management, please place consult for IR evaluation, and resuming they placed the G-tube.  She will benefit from EGD to exclude any ulceration on the gastric mucosa or issues.  Please schedule EGD at hospital next available. ----- Message ----- From: Unk Lightning, PA Sent: 12/04/2023   3:12 PM EDT To: Napoleon Form, MD Subject: complicated                                    Can you look over my last note for this patient.  She is having pain at her G-tube insertion site.  I know were not the ones who did this and it does not look external just wondering if maybe she would benefit from an EGD but she is also very debilitated on oxygen excetra, let me know what you think.  Thanks, JL L

## 2023-12-11 ENCOUNTER — Ambulatory Visit

## 2023-12-13 ENCOUNTER — Ambulatory Visit (HOSPITAL_COMMUNITY)
Admission: RE | Admit: 2023-12-13 | Discharge: 2023-12-13 | Disposition: A | Discharge status: Home / Self Care | Source: Ambulatory Visit | Attending: Student

## 2023-12-13 ENCOUNTER — Ambulatory Visit (HOSPITAL_COMMUNITY)
Admission: RE | Admit: 2023-12-13 | Discharge: 2023-12-13 | Disposition: A | Discharge status: Home / Self Care | Source: Ambulatory Visit | Attending: Gastroenterology | Admitting: Gastroenterology

## 2023-12-13 ENCOUNTER — Other Ambulatory Visit (HOSPITAL_COMMUNITY): Payer: Self-pay | Admitting: Student

## 2023-12-13 ENCOUNTER — Other Ambulatory Visit: Payer: Self-pay | Admitting: Gastroenterology

## 2023-12-13 DIAGNOSIS — E46 Unspecified protein-calorie malnutrition: Secondary | ICD-10-CM

## 2023-12-13 DIAGNOSIS — R109 Unspecified abdominal pain: Secondary | ICD-10-CM | POA: Diagnosis not present

## 2023-12-13 DIAGNOSIS — Z431 Encounter for attention to gastrostomy: Secondary | ICD-10-CM | POA: Diagnosis not present

## 2023-12-13 DIAGNOSIS — K9429 Other complications of gastrostomy: Secondary | ICD-10-CM | POA: Diagnosis not present

## 2023-12-13 DIAGNOSIS — Z931 Gastrostomy status: Secondary | ICD-10-CM

## 2023-12-13 DIAGNOSIS — Z4682 Encounter for fitting and adjustment of non-vascular catheter: Secondary | ICD-10-CM | POA: Diagnosis not present

## 2023-12-13 HISTORY — PX: IR REPLACE G-TUBE SIMPLE WO FLUORO: IMG2323

## 2023-12-13 MED ORDER — DIATRIZOATE MEGLUMINE & SODIUM 66-10 % PO SOLN
ORAL | Status: AC
Start: 2023-12-13 — End: ?
  Filled 2023-12-13: qty 30

## 2023-12-13 MED ORDER — LIDOCAINE VISCOUS HCL 2 % MT SOLN
OROMUCOSAL | Status: AC
  Filled 2023-12-13: qty 15

## 2023-12-13 NOTE — Progress Notes (Signed)
 Referring Physician(s): Napoleon Form  Supervising Physician: Irish Lack  Patient Status:  [facility] - In-pt  Chief Complaint: Abdominal pain  Subjective: Cynthia Underwood is a 53 year old female with history of myotonic dystrophy complicated by heart block requiring a pacemaker who had an episode of cardiac arrest in the Spring of 2024 ultimately requiring trach and gastrostomy tube placement.  Gastrostomy tube placed 01/01/24 by Dr. Loreta Ave, 20 Fr pull through. She has since been able to decannulate her trach and has been eating PO for comfort/pleasure feeds in supplement to her regular enteral nutrition support.  She presents for evaluation today due to painful gastrostomy tube.    Ms. Quinteros reports the tube has been tender for several weeks even up to the last few months.  Denies any traumatic event or issue with the tube.  She is not aware of any pulling on the tube.  Reports pain is worst when sitting or standing.  She can lie down without discomfort.  Using the tube does not contribute to any significant change in symptoms.  She reports the tube is functioning well although does occasionally get clogged and needs de=clogging which has always been done successfully at home.  She desire replacement/exchange if possible as the tube has been present for nearly a year.   Allergies: Penicillin g and Penicillins  Medications: Prior to Admission medications   Medication Sig Start Date End Date Taking? Authorizing Provider  acetaminophen (TYLENOL) 500 MG tablet Place 500 mg into feeding tube every 6 (six) hours as needed for mild pain.    [provider]  guaifenesin (HUMIBID E) 400 MG TABS tablet 400 mg 3 (three) times daily. Place 400mg  into feeding tube three times a day 01/19/23   [provider]  ipratropium-albuterol (DUONEB) 0.5-2.5 (3) MG/3ML SOLN Inhale 3 mLs into the lungs every 4 (four) hours as needed. SMARTSIG:1 Unspecified Via Inhaler Every 4 Hours PRN 05/09/23    Martina Sinner, MD  levothyroxine (SYNTHROID) 100 MCG tablet PLACE 1 TABLET (100 MCG TOTAL) INTO FEEDING TUBE DAILY BEFORE BREAKFAST. 11/30/23   Hoy Register, MD  metoprolol tartrate (LOPRESSOR) 25 MG tablet Place 0.5 tablets (12.5 mg total) into feeding tube 2 (two) times daily. 06/14/23   Hoy Register, MD  Misc. Devices MISC Eval & titrate pt for poc for portable oxygen container. If patient qualifies, dispense at 1-6 pulse dose. Please fax to [phone]. Diagnosis-aspiration pneumonia 06/26/23   Hoy Register, MD  Misc. Devices MISC Shower chair.  Diagnosis myotonic dystrophy 09/14/23   Hoy Register, MD  Nutritional Supplements (FEEDING SUPPLEMENT, JEVITY 1.5 CAL/FIBER,) LIQD Place 237 mLs into feeding tube 4 (four) times daily. FWF before and after each feeding 05/25/23   Glade Lloyd, MD  spironolactone (ALDACTONE) 25 MG tablet PLACE 0.5 TABLETS (12.5 MG TOTAL) INTO FEEDING TUBE DAILY. 10/01/23   Hoy Register, MD  Water For Irrigation, Sterile (FREE WATER) SOLN free water via tube before and after each feeding 05/25/23   Glade Lloyd, MD     Vital Signs: LMP 12/03/2016 (Approximate)   Physical Exam NAD, alert Abdomen: soft, mild tenderness around the insertion site. NO erythema or irritation.  No oozing. No skin breakdown. No discoloration or extreme wear of the tube.   Imaging: No results found.  Labs:  CBC: Recent Labs    05/22/23 0556 05/23/23 0944 05/24/23 0456 08/09/23 2038  WBC 6.2 4.3 5.3 9.3  HGB 9.3* 10.1* 11.6* 14.0  HCT 29.9* 31.2* 37.7 43.1  PLT  259 286 351 PLATELET CLUMPS NOTED ON SMEAR, UNABLE TO ESTIMATE    COAGS: Recent Labs    01/01/23 0736 05/20/23 2256  INR 1.6* 1.3*    BMP: Recent Labs    05/22/23 0556 05/23/23 0944 05/24/23 0456 05/28/23 1454 08/09/23 2038  NA 141 138 142  --  141  K 3.8 3.2* 4.1 4.3 4.8  CL 106 102 103  --  104  CO2 24 29 29   --  28  GLUCOSE 64* 86 85  --  103*  BUN <5* <5* <5*  --  12   CALCIUM 8.3* 8.5* 9.2  --  9.6  CREATININE 0.50 0.43* 0.51  --  0.54  GFRNONAA >60 >60 >60  --  >60    LIVER FUNCTION TESTS: Recent Labs    01/22/23 2311 05/04/23 2227 05/20/23 2256 08/09/23 2038  BILITOT 0.4 0.6 0.7 0.9  AST 24 33 23 34  ALT 39 25 13 21   ALKPHOS 71 62 73 51  PROT 7.5 8.7* 8.7* 7.5  ALBUMIN 2.6* 3.9 3.0* 3.6    Assessment and Plan: Protein-calorie malnutrition Patient s/p 20 Fr pull through gastrostomy tube placement 01/01/2023.  She reports ongoing abdominal pain and tenderness near her tube insertion site which appears musculoskeletal in nature likely due to her thin frame with large bore tube.  After discussion, plan was made to replacement of tube at the patient's request.  She desires a new tube and with exchange, we should be able to downsize from 20Fr pull through to 63Fr balloon which may help with her tolerance of the tube.  Consent obtained. Tract lubricated with viscous lidocaine.  Successful bedside removal of the 20Fr gastrostomy tube with cap intact.  Successful placement of a 63F Enfit balloon-retention gastrostomy tube.  Balloon filled with 8mL sterile saline.  Connector/adaptor replaced for patient with traditional supplies at home.   KUB obtained for verification of placement prior to discharge.   Electronically Signed: Hoyt Koch, PA 12/13/2023, 11:35 AM   I spent a total of 25 Minutes at the the patient's bedside AND on the patient's hospital floor or unit, greater than 50% of which was counseling/coordinating care for protein-calorie malnutrition.

## 2023-12-14 ENCOUNTER — Other Ambulatory Visit: Payer: Self-pay | Admitting: Gastroenterology

## 2023-12-14 ENCOUNTER — Encounter (HOSPITAL_COMMUNITY): Payer: Self-pay | Admitting: Radiology

## 2023-12-14 DIAGNOSIS — Z931 Gastrostomy status: Secondary | ICD-10-CM

## 2023-12-14 DIAGNOSIS — E46 Unspecified protein-calorie malnutrition: Secondary | ICD-10-CM

## 2023-12-17 ENCOUNTER — Encounter: Payer: Self-pay | Admitting: Internal Medicine

## 2023-12-19 ENCOUNTER — Telehealth: Payer: Self-pay | Admitting: Family Medicine

## 2023-12-19 NOTE — Telephone Encounter (Signed)
 Could you please do rx and have Dr. Adan Holms sign it

## 2023-12-19 NOTE — Telephone Encounter (Signed)
  Copied from CRM [id number]. Topic: Clinical - Prescription Issue >> Dec 19, 2023  9:30 AM Hobson Luna F wrote:  Reason for CRM: Ailene Housekeeper is calling in because [facility] needs a new prescription for patient's Nutritional Supplements (FEEDING SUPPLEMENT, JEVITY 1.5 CAL/FIBER,) LIQD [[id number]], please fax it to [phone]

## 2023-12-20 ENCOUNTER — Telehealth: Payer: Self-pay

## 2023-12-20 NOTE — Telephone Encounter (Signed)
 Copied from CRM [id number]. Topic: General - Call Back - No Documentation >> Dec 20, 2023  5:07 PM Ethelle Herb L wrote: Reason for CRM: Patient returning call to [facility], pt will call back after checking voicemail to see if message was left for her. >> Dec 20, 2023  5:16 PM DeAngela L wrote: Patient states there was no VM left for her so she is calling to ask what he phone call was concerning

## 2023-12-20 NOTE — Telephone Encounter (Addendum)
 PCP is not the Clinical research associate of this prescription. Call to patient and patient's niece VICTORIA BLACK . Patient does not have enough JEVITY  to get through the weekend. VICTORIA voiced that they have been receiving JEVITY  from [facility]. Call to   Lenon Radar, Clinical Liasion with [facility]. Explained patient situation to [facility] he voiced that he would try to get enough JEVITY to the patient until Monday.

## 2023-12-24 MED ORDER — JEVITY 1.5 CAL/FIBER PO LIQD: 237.0000 mL | Freq: Four times a day (QID) | ORAL | 12 refills | Status: AC

## 2023-12-24 NOTE — Telephone Encounter (Signed)
 Noted. See previous noted.

## 2023-12-24 NOTE — Telephone Encounter (Signed)
Confirmation of faxed received

## 2023-12-24 NOTE — Addendum Note (Signed)
 Addended by: Rumaldo Difatta on: 12/24/2023 08:50 AM   Modules accepted: Orders

## 2023-12-24 NOTE — Telephone Encounter (Signed)
 I have printed out the prescription.  Okay to fax to DME company.

## 2023-12-24 NOTE — Telephone Encounter (Signed)
Faxed to [facility] 

## 2023-12-25 ENCOUNTER — Other Ambulatory Visit: Payer: Self-pay

## 2023-12-25 DIAGNOSIS — E43 Unspecified severe protein-calorie malnutrition: Secondary | ICD-10-CM | POA: Diagnosis not present

## 2023-12-25 DIAGNOSIS — I5043 Acute on chronic combined systolic (congestive) and diastolic (congestive) heart failure: Secondary | ICD-10-CM | POA: Diagnosis not present

## 2023-12-25 DIAGNOSIS — Z931 Gastrostomy status: Secondary | ICD-10-CM | POA: Diagnosis not present

## 2023-12-25 DIAGNOSIS — R1310 Dysphagia, unspecified: Secondary | ICD-10-CM | POA: Diagnosis not present

## 2023-12-25 DIAGNOSIS — G931 Anoxic brain damage, not elsewhere classified: Secondary | ICD-10-CM | POA: Diagnosis not present

## 2023-12-26 NOTE — Patient Outreach (Signed)
 Complex Care Management   Visit Note  12/26/2023  Name:  Cynthia Underwood MRN: [id number] DOB: [birth year]  Situation: Referral received for Complex Care Management related to  Imbalanced Nutrition and Impaired Swallowing  I obtained verbal consent from Patient.  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   Sickle cell trait (HCC)    Watery eyes    Left   Weakness of both legs     Assessment: Patient Reported Symptoms:  Cognitive Cognitive Status: Alert and oriented to person, place, and time, Insightful and able to interpret abstract concepts      Neurological Neurological Review of Symptoms: No symptoms reported    HEENT        Cardiovascular Cardiovascular Symptoms Reported: No symptoms reported Cardiovascular Conditions: Heart failure (Sickle cell trait) Cardiovascular Management Strategies: Medical device (Pacemaker intact)  Respiratory Respiratory Symptoms Reported: Shortness of breath (Shortness of breath with exertion at baseline, wears 3LO2) Respiratory Conditions: Shortness of breath Respiratory Self-Management Outcome: 4 (good)  Endocrine Patient reports the following symptoms related to hypoglycemia or hyperglycemia : No symptoms reported Endocrine Conditions: Thyroid  disorder Endocrine Management Strategies: Medication therapy Endocrine Self-Management Outcome: 4 (good)  Gastrointestinal Gastrointestinal Symptoms Reported:  (Gastro-Tube intact, uses Jevity for nutrition, crushes pills to insert into G-Tube.  She was having pain at g-tube insertions site but resolved since tubing was changed. She is aware she is scheduled for [facility]/24 at [facility].) Gastrointestinal  Management Strategies: Nutrition support Enteral Nutrition Time Frame: Bolus Gastrointestinal Self-Management Outcome: 4 (good) Gastrointestinal Comment: Per Gastro visit summary dated 12/04/23, patient has gained 8lbs since last visit. Nutrition Risk Screen (CP): Tube feeding or parenteral nutrition  Genitourinary Genitourinary Symptoms Reported: No symptoms reported    Integumentary Integumentary Symptoms Reported: No symptoms reported    Musculoskeletal Musculoskelatal Symptoms Reviewed: Unsteady gait Musculoskeletal Conditions: Other Other Musculoskeletal Conditions: Myotonic dystrophy Musculoskeletal Management Strategies: Medical device (Uses walker when ambulating outside, no ambulation device inside the home.)      Psychosocial Psychosocial Symptoms Reported: No symptoms reported     Quality of Family Relationships: helpful, involved, supportive Do you feel physically threatened by others?: No      12/25/2023    3:32 PM  Depression screen PHQ 2/9  Decreased Interest 0  Down, Depressed, Hopeless 0  PHQ - 2 Score 0    There were no vitals filed for this visit.  Medications Reviewed Today     Reviewed by Isadore Marble, RN (Registered Nurse) on 12/25/23 at 1528  Med List Status: <None>   Medication Order Taking? Sig Documenting Provider Last Dose Status Informant  acetaminophen  (TYLENOL ) 500 MG tablet [id number] Yes Place 500 mg into feeding tube every 6 (six) hours as needed for mild pain. [provider] Taking Active Family Member  guaifenesin  (HUMIBID E) 400 MG TABS tablet [id number] Yes 400 mg 3 (three) times daily. Place 400mg  into feeding tube three times a day [provider] Taking Active Family Member  ipratropium-albuterol  (DUONEB) 0.5-2.5 (3) MG/3ML SOLN [id number] Yes Inhale 3 mLs into the lungs every 4 (four) hours as needed. SMARTSIG:1 Unspecified Via Inhaler Every 4 Hours PRN Wilfredo Hanly,  MD Taking Active Family Member  levothyroxine   (SYNTHROID ) 100 MCG tablet [id number] Yes PLACE 1 TABLET (100 MCG TOTAL) INTO FEEDING TUBE DAILY BEFORE BREAKFAST. Newlin, Enobong, MD Taking Active   metoprolol  tartrate (LOPRESSOR ) 25 MG tablet [id number] Yes Place 0.5 tablets (12.5 mg total) into feeding tube 2 (two) times daily. Newlin, Enobong, MD Taking Active   Misc. Devices MISC [id number] Yes Eval & titrate pt for poc for portable oxygen  container. If patient qualifies, dispense at 1-6 pulse dose. Please fax to [phone]. Diagnosis-aspiration pneumonia Joaquin Mulberry, MD Taking Active   Misc. Devices MISC [id number] Yes Shower chair.  Diagnosis myotonic dystrophy Joaquin Mulberry, MD Taking Active   Nutritional Supplements (FEEDING SUPPLEMENT, JEVITY 1.5 CAL/FIBER,) LIQD [id number] Yes Place 237 mLs into feeding tube 4 (four) times daily. FWF before and after each feeding Newlin, Enobong, MD Taking Active   spironolactone  (ALDACTONE ) 25 MG tablet [id number] Yes PLACE 0.5 TABLETS (12.5 MG TOTAL) INTO FEEDING TUBE DAILY. Joaquin Mulberry, MD Taking Active   Water  For Irrigation, Sterile (FREE WATER ) SOLN [id number] Yes free water  via tube before and after each feeding Audria Leather, MD Taking Active             Recommendation:   Specialty provider follow-up Scheduled for EGD 02/19/24 at 8:00am at [facility] She is inquiring of a shower seat that she can get through her insurance, will place call to PCP office for order.  She will perform exercises at home as recommended by Speech Therapy.   Follow Up Plan:   Telephone follow up appointment date/time:  Tuesday, May 6th at 2:00pm   Jurline Olmsted BSN, CCM [place]  [facility] Population Health RN Care Manager Direct Dial: [phone]  Fax: [phone]

## 2023-12-26 NOTE — Patient Instructions (Signed)
 Visit Information  Thank you for taking time to visit with me today. Please don't hesitate to contact me if I can be of assistance to you before our next scheduled appointment.  Your next care management appointment is by telephone on Tuesday, May 6th  at 2:00pm.   Please call the care guide team at [phone] if you need to cancel, schedule, or reschedule an appointment.   Please  if you are experiencing a Mental Health or Behavioral Health Crisis or need someone to talk to.  Jurline Olmsted BSN, CCM [place]  [facility] Population Health RN Care Manager Direct Dial: [phone]  Fax: [phone]

## 2024-01-04 NOTE — Addendum Note (Signed)
 Addended by: Edra Govern D on: 01/04/2024 05:18 PM   Modules accepted: Orders

## 2024-01-04 NOTE — Progress Notes (Signed)
 Remote pacemaker transmission.

## 2024-01-08 ENCOUNTER — Other Ambulatory Visit: Payer: Self-pay

## 2024-01-09 ENCOUNTER — Other Ambulatory Visit: Payer: Self-pay | Admitting: Family Medicine

## 2024-01-09 NOTE — Patient Instructions (Signed)
 Visit Information  Thank you for taking time to visit with me today. Please don't hesitate to contact me if I can be of assistance to you before our next scheduled appointment.  Your next care management appointment is by telephone on Tuesday, June 3rd at 2:00pm.  Please call the care guide team at [phone] if you need to cancel, schedule, or reschedule an appointment.   Please call the Suicide and Crisis Lifeline: 988 if you are experiencing a Mental Health or Behavioral Health Crisis or need someone to talk to.  Jurline Olmsted BSN, CCM [place]  [facility] Population Health RN Care Manager Direct Dial: [phone]  Fax: [phone]

## 2024-01-09 NOTE — Patient Outreach (Signed)
 Complex Care Management   Visit Note  01/09/2024  Name:  Cynthia Underwood MRN: [id number] DOB: May 07, 1971  Situation: Referral received for Complex Care Management related to  Impaired Speech and Difficulty Swallowing  I obtained verbal consent from Patient.  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   Sickle cell trait (HCC)    Watery eyes    Left   Weakness of both legs     Assessment: Patient Reported Symptoms:  Cognitive Cognitive Status: Alert and oriented to person, place, and time, Insightful and able to interpret abstract concepts (Speaks in whispering tones, at baseline)      Neurological Neurological Review of Symptoms: No symptoms reported    HEENT HEENT Symptoms Reported: No symptoms reported      Cardiovascular Cardiovascular Symptoms Reported: No symptoms reported    Respiratory Respiratory Symptoms Reported: No symptoms reported    Endocrine Patient reports the following symptoms related to hypoglycemia or hyperglycemia : No symptoms reported    Gastrointestinal Gastrointestinal Symptoms Reported: No symptoms reported (Uses Miralax  if she gets constipated) Gastrointestinal Management Strategies: Nutrition support Enteral Nutrition Time Frame: Bolus Gastrointestinal Self-Management Outcome: 4 (good) Nutrition Risk Screen (CP): Difficulty chewing/swallowing, Tube feeding or parenteral nutrition  Genitourinary Genitourinary Symptoms Reported: No symptoms reported    Integumentary Integumentary Symptoms Reported: No symptoms reported    Musculoskeletal Musculoskelatal Symptoms Reviewed: Unsteady gait (Continues to use walker when ambulating outside)         Psychosocial Psychosocial Symptoms Reported: No symptoms reported            12/25/2023    3:32 PM  Depression screen PHQ 2/9  Decreased Interest 0  Down, Depressed, Hopeless 0  PHQ - 2 Score 0    There were no vitals filed for this visit.  Medications Reviewed Today     Reviewed by Isadore Marble, RN (Registered Nurse) on 01/08/24 at 1436  Med List Status: <None>   Medication Order Taking? Sig Documenting Provider Last Dose Status Informant  acetaminophen  (TYLENOL ) 500 MG tablet [id number]  Place 500 mg into feeding tube every 6 (six) hours as needed for mild pain. [provider]  Active Family Member  guaifenesin  (HUMIBID E) 400 MG TABS tablet [id number]  400 mg 3 (three) times daily. Place 400mg  into feeding tube three times a day [provider]  Active Family Member  ipratropium-albuterol  (DUONEB) 0.5-2.5 (3) MG/3ML SOLN [id number]  Inhale 3 mLs into the lungs every 4 (four) hours as needed. SMARTSIG:1 Unspecified Via Inhaler Every 4 Hours PRN Wilfredo Hanly, MD  Active Family Member  levothyroxine  (SYNTHROID ) 100 MCG tablet [id number] Yes PLACE 1 TABLET (100 MCG TOTAL) INTO FEEDING TUBE DAILY BEFORE BREAKFAST. Newlin, Enobong, MD Taking Active   metoprolol  tartrate (LOPRESSOR ) 25 MG tablet [id number] Yes Place 0.5 tablets (12.5 mg total) into feeding tube 2 (two) times daily. Newlin, Enobong, MD Taking Active   Misc. Devices MISC [id number]  Eval & titrate pt for poc for portable oxygen  container. If patient qualifies, dispense at 1-6 pulse dose. Please fax to [phone]. Diagnosis-aspiration pneumonia Joaquin Mulberry, MD  Active   Misc. Devices MISC [id number]  Shower chair.  Diagnosis myotonic dystrophy Joaquin Mulberry, MD  Active   Nutritional Supplements (FEEDING SUPPLEMENT, JEVITY 1.5 CAL/FIBER,) LIQD [id number]  Place 237 mLs into feeding tube 4 (four) times daily. FWF before and after each feeding Newlin, Enobong, MD  Active   spironolactone   (ALDACTONE ) 25 MG tablet [id number]  PLACE 0.5 TABLETS (12.5 MG TOTAL) INTO FEEDING TUBE DAILY. Newlin, Enobong, MD  Active   Water  For Irrigation, Sterile (FREE WATER ) SOLN [id number]  free water  via tube before and after each feeding Audria Leather, MD  Active             Recommendation:   She will follow up with PCP at next office visit scheduled 01/22/24 regarding prescribing vitamins and stool softeners that can be crushed and placed in G-tube.  She will attend EGD scheduled 02/19/24 to further assess pain at G-tube site, she denies pain at site today.    Follow Up Plan:   Telephone follow-up Tuesday, June 3rd at 2:00pm.   Jurline Olmsted BSN, CCM [place]  [facility] Population Health RN Care Manager Direct Dial: [phone]  Fax: [phone]

## 2024-01-15 ENCOUNTER — Ambulatory Visit: Attending: Family Medicine

## 2024-01-15 VITALS — Wt 90.0 lb

## 2024-01-15 DIAGNOSIS — Z0000 Encounter for general adult medical examination without abnormal findings: Secondary | ICD-10-CM

## 2024-01-15 NOTE — Progress Notes (Addendum)
 Because this visit was a virtual/telehealth visit,  certain criteria was not obtained, such a blood pressure, CBG if applicable, and timed get up and go. Any medications not marked as "taking" were not mentioned during the medication reconciliation part of the visit. Any vitals not documented were not able to be obtained due to this being a telehealth visit or patient was unable to self-report a recent blood pressure reading due to a lack of equipment at home via telehealth. Vitals that have been documented are verbally provided by the patient.   Subjective:   Cynthia CROTTS is a 53 y.o. who presents for a Medicare Wellness preventive visit.  As a reminder, Annual Wellness Visits don't include a physical exam, and some assessments may be limited, especially if this visit is performed virtually. We may recommend an in-person visit if needed.  Visit Complete: Virtual I connected with  Cynthia Underwood on 01/15/24 by a audio enabled telemedicine application and verified that I am speaking with the correct person using two identifiers.  Patient Location: Home  Provider Location: Office/Clinic  I discussed the limitations of evaluation and management by telemedicine. The patient expressed understanding and agreed to proceed.  Vital Signs: Because this visit was a virtual/telehealth visit, some criteria may be missing or patient reported. Any vitals not documented were not able to be obtained and vitals that have been documented are patient reported.  VideoDeclined- This patient declined Librarian, academic. Therefore the visit was completed with audio only.  Persons Participating in Visit: Patient.  AWV Questionnaire: Yes: Patient Medicare AWV questionnaire was completed by the patient on 01/11/2024; I have confirmed that all information answered by patient is correct and no changes since this date.  Cardiac Risk Factors include: family history of premature  cardiovascular disease;sedentary lifestyle     Objective:     Today's Vitals   01/15/24 1656  Weight: 90 lb (40.8 kg)  PainSc: 10-Worst pain ever  PainLoc: Toe   Body mass index is 15.94 kg/m.     01/15/2024    4:58 PM 09/11/2023   11:57 AM 09/04/2023    8:45 AM 08/17/2023    8:46 AM 08/09/2023    4:24 PM 05/21/2023    6:00 AM 05/04/2023    7:47 PM  Advanced Directives  Does Patient Have a Medical Advance Directive? Yes Yes Yes Yes No Yes No  Type of Estate agent of [place];Living will Healthcare Power of [place];Living will Healthcare Power of [place];Living will   Healthcare Power of Attorney   Does patient want to make changes to medical advance directive?    No - Patient declined  No - Patient declined   Copy of Healthcare Power of Attorney in Chart? No - copy requested     No - copy requested   Would patient like information on creating a medical advance directive?      No - Patient declined No - Patient declined    Current Medications (verified) Outpatient Encounter Medications as of 01/15/2024  Medication Sig   acetaminophen  (TYLENOL ) 500 MG tablet Place 500 mg into feeding tube every 6 (six) hours as needed for mild pain.   guaifenesin  (HUMIBID E) 400 MG TABS tablet 400 mg 3 (three) times daily. Place 400mg  into feeding tube three times a day   ipratropium-albuterol  (DUONEB) 0.5-2.5 (3) MG/3ML SOLN Inhale 3 mLs into the lungs every 4 (four) hours as needed. SMARTSIG:1 Unspecified Via Inhaler Every 4 Hours PRN   levothyroxine  (  SYNTHROID ) 100 MCG tablet Take 1 tablet (100 mcg total) by mouth daily before breakfast. Must have office visit for refills   metoprolol  tartrate (LOPRESSOR ) 25 MG tablet Place 0.5 tablets (12.5 mg total) into feeding tube 2 (two) times daily.   Misc. Devices MISC Eval & titrate pt for poc for portable oxygen  container. If patient qualifies, dispense at 1-6 pulse dose. Please fax to [phone]. Diagnosis-aspiration pneumonia    Misc. Devices MISC Shower chair.  Diagnosis myotonic dystrophy   Nutritional Supplements (FEEDING SUPPLEMENT, JEVITY 1.5 CAL/FIBER,) LIQD Place 237 mLs into feeding tube 4 (four) times daily. FWF before and after each feeding   spironolactone  (ALDACTONE ) 25 MG tablet PLACE 0.5 TABLETS (12.5 MG TOTAL) INTO FEEDING TUBE DAILY.   Water  For Irrigation, Sterile (FREE WATER ) SOLN 100ml free water  via tube before and after each feeding   No facility-administered encounter medications on file as of 01/15/2024.    Allergies (verified) Penicillin g and Penicillins   History: Past Medical History:  Diagnosis Date   Bifascicular block 10/02/2014   Cataract    Dyspnea    Excess ear wax    Multinodular goiter    Pneumonia    Presence of permanent cardiac pacemaker    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   Sickle cell trait (HCC)    Watery eyes    Left   Weakness of both legs    Past Surgical History:  Procedure Laterality Date   BREAST BIOPSY Right    EYE SURGERY Left 2017   "related to blockage in my nose"   EYE SURGERY Right 05/2019   INSERT / REPLACE / REMOVE PACEMAKER  02/15/2017   IR GASTROSTOMY TUBE MOD SED  01/01/2023   IR REPLACE G-TUBE SIMPLE WO FLUORO  12/13/2023   PACEMAKER IMPLANT N/A 02/15/2017   Procedure: Pacemaker Implant;  Surgeon: Verona Goodwill, MD;  Location: [facility] INVASIVE CV LAB;  Service: Cardiovascular;  Laterality: N/A;   RIGHT/LEFT HEART CATH AND CORONARY ANGIOGRAPHY N/A 11/16/2022   Procedure: RIGHT/LEFT HEART CATH AND CORONARY ANGIOGRAPHY;  Surgeon: Darlis Eisenmenger, MD;  Location[facility] IN[facility]AS[facility] CV LAB;  Service: Cardiovascular;  Laterality: N/A;   THYROIDECTOMY N/A 04/21/2021   Procedure: TOTAL THYROIDECTOMY;  Surgeon: Shela Derby, MD;  Location: [facility] OR;  Service: General;  Laterality: N/A;   Family History  Problem Relation Age of Onset   Cancer Father 110       Deceased   Heart disease Father    COPD Mother     Diabetes Maternal Uncle    Heart disease Maternal Uncle    Heart disease Paternal Grandmother    Diabetes Paternal Grandmother    Hypertension Paternal Grandmother    Neuromuscular disorder Maternal Aunt        Myotonic dystrophy   Neuromuscular disorder Cousin        Myotonic dystrophy   Neuromuscular disorder Sister        Myotonic dystrophy   Colon cancer Neg Hx    Colon polyps Neg Hx    Esophageal cancer Neg Hx    Rectal cancer Neg Hx    Stomach cancer Neg Hx    Social History   Socioeconomic History   Marital status: Single    Spouse name: Not on file   Number of children: 0   Years of education: 1.5 Collge   Highest education level: Some college, no degree  Occupational History   Occupation: Designer, industrial/product: OTHER  Tobacco Use   Smoking status: Former    Current packs/day: 0.00    Types: Cigarettes    Start date: 10/04/1988    Quit date: 10/04/2016    Years since quitting: 7.2   Smokeless tobacco: Never  Vaping Use   Vaping status: Never Used  Substance and Sexual Activity   Alcohol use: Not Currently    Alcohol/week: 0.0 standard drinks of alcohol    Comment:  few times a year   Drug use: Not Currently    Types: Marijuana    Comment: occ    Sexual activity: Not Currently    Birth control/protection: None, Condom  Other Topics Concern   Not on file  Social History Narrative   Patient lives at home with her aunt.   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  She moved to [facility] in August 2015.   She has been on disability since 2010.   Education: 1 1/2 years of college.   Caffeine - coke 2 cans/day   Exercise - no   Social Drivers of Corporate investment banker Strain: Low Risk  (01/15/2024)   Overall Financial Resource Strain (CARDIA)    Difficulty of Paying Living Expenses: Not hard at all  Food Insecurity: No Food Insecurity (01/15/2024)   Hunger Vital Sign    Worried About Running Out of Food in the Last Year: Never true     Ran Out of Food in the Last Year: Never true  Transportation Needs: No Transportation Needs (01/15/2024)   PRAPARE - Administrator, Civil Service (Medical): No    Lack of Transportation (Non-Medical): No  Physical Activity: Patient Declined (01/15/2024)   Exercise Vital Sign    Days of Exercise per Week: Patient declined    Minutes of Exercise per Session: Patient declined  Stress: No Stress Concern Present (01/15/2024)   [employer] of Occupational Health - Occupational Stress Questionnaire    Feeling of Stress : Only a little  Social Connections: Unknown (01/15/2024)   Social Connection and Isolation Panel [NHANES]    Frequency of Communication with Friends and Family: More than three times a week    Frequency of Social Gatherings with Friends and Family: Twice a week    Attends Religious Services: Patient declined    Database administrator or Organizations: No    Attends Engineer, structural: Never    Marital Status: Never married    Tobacco Counseling Counseling given: Not Answered    Clinical Intake:  Pre-visit preparation completed: Yes  Pain : 0-10 Pain Score: 10-Worst pain ever Pain Type: Acute pain Pain Location: Toe (Comment which one)     BMI - recorded: 15.94 Nutritional Status: BMI <19  Underweight Nutritional Risks: None Diabetes: No  Lab Results  Component Value Date   HGBA1C 5.6 11/15/2022   HGBA1C 5.5 11/14/2022   HGBA1C 5.7 01/09/2017     How often do you need to have someone help you when you read instructions, pamphlets, or other written materials from your doctor or pharmacy?: 1 - Never What is the last grade level you completed in school?: SOME COLLEGE  Interpreter Needed?: No  Information entered by :: Druscilla Gerhard, LPN.   Activities of Daily Living     01/11/2024    4:56 PM 05/21/2023    6:00 AM  In your present state of health, do you have any difficulty performing the following activities:  Hearing?  0 0  Vision? 0  0  Difficulty concentrating or making decisions? 0 0  Walking or climbing stairs? 1 1  Dressing or bathing? 0 1  Doing errands, shopping? 0 0  Preparing Food and eating ? N   Using the Toilet? N   In the past six months, have you accidently leaked urine? N   Do you have problems with loss of bowel control? Y   Managing your Medications? N   Managing your Finances? N   Housekeeping or managing your Housekeeping? N     Patient Care Team: Joaquin Mulberry, MD as PCP - General (Family Medicine) Jacqueline Matsu, MD as PCP - Cardiology (Cardiology) Verona Goodwill, MD as PCP - Electrophysiology (Cardiology) Patel, Donika K, DO as Consulting Physician (Neurology) Augustin Leber, RN as [facility] Augustin Leber, RN Slusher, Rojean Cleaves, RN as Registered Nurse  Indicate any recent Medical Services you may have received from other than [facility] providers in the past year (date may be approximate).     Assessment:    This is a routine wellness examination for Safina.  Hearing/Vision screen Hearing Screening - Comments:: Patient denied any hearing difficulty.   No hearing aids.  Vision Screening - Comments:: Patient does wear corrective lenses.     Goals Addressed             This Visit's Progress    01/15/2024: I want to put on some weight, increase my appetite and start eating solid foods.         Depression Screen     01/15/2024    4:58 PM 12/25/2023    3:32 PM 08/22/2023   10:25 AM 07/11/2023    4:08 PM 05/16/2023    2:10 PM 03/13/2023   11:15 AM 11/08/2022    2:28 PM  PHQ 2/9 Scores  PHQ - 2 Score 0 0 3 3 2  0 0  PHQ- 9 Score 8  8 8 9 3 7     Fall Risk     01/15/2024    5:01 PM 01/11/2024    4:56 PM 09/04/2023    8:45 AM 08/22/2023   10:25 AM 07/23/2023    4:16 PM  Fall Risk   Falls in the past year? 0 0 0 0 0  Number falls in past yr: 0  0 0 0  Injury with Fall? 0 0 0 0 0  Risk for fall due to : No Fall Risks   No Fall Risks   Follow up Falls  prevention discussed;Falls evaluation completed  Falls evaluation completed Falls evaluation completed     MEDICARE RISK AT HOME:  Medicare Risk at Home Any stairs in or around the home?: No If so, are there any without handrails?: No Home free of loose throw rugs in walkways, pet beds, electrical cords, etc?: No Adequate lighting in your home to reduce risk of falls?: Yes Life alert?: Yes Use of a cane, walker or w/c?: Yes Grab bars in the bathroom?: Yes Shower chair or bench in shower?: Yes Elevated toilet seat or a handicapped toilet?: No  TIMED UP AND GO:  Was the test performed?  No  Cognitive Function: 6CIT completed    01/15/2024    5:01 PM 10/16/2022    3:42 PM 07/25/2021    4:03 PM  MMSE - Mini Mental State Exam  Not completed: Unable to complete    Orientation to time  5 5  Orientation to Place  5 5  Registration  3 3  Attention/ Calculation  5  5  Recall  3 3  Language- name 2 objects  2 2  Language- repeat  1 1  Language- follow 3 step command  3 3  Language- read & follow direction  1 1  Write a sentence  1 1  Copy design  1 1  Total score  30 30        01/15/2024    5:07 PM 10/16/2022    3:42 PM  6CIT Screen  What Year? 0 points 0 points  What month? 0 points 0 points  What time? 0 points 0 points  Count back from 20 0 points 0 points  Months in reverse 0 points 0 points  Repeat phrase 0 points 0 points  Total Score 0 points 0 points    Immunizations Immunization History  Administered Date(s) Administered   Influenza, Seasonal, Injecte, Preservative Fre 05/16/2023   Influenza,inj,Quad PF,6+ Mos 08/11/2014, 07/01/2015, 08/04/2019, 07/12/2020, 07/25/2021, 11/08/2022   PNEUMOCOCCAL CONJUGATE-20 07/25/2021   Pneumococcal Polysaccharide-23 08/04/2019   Tdap 01/02/2018    Screening Tests Health Maintenance  Topic Date Due   Zoster Vaccines- Shingrix (1 of 2) Never done   Colonoscopy  10/06/2022   COVID-19 Vaccine (1 - 2024-25 season) Never done    MAMMOGRAM  07/21/2023   INFLUENZA VACCINE  04/04/2024   Medicare Annual Wellness (AWV)  01/14/2025   Cervical Cancer Screening (HPV/Pap Cotest)  11/08/2027   DTaP/Tdap/Td (2 - Td or Tdap) 01/03/2028   Pneumococcal Vaccine 32-65 Years old  Completed   Hepatitis C Screening  Completed   HIV Screening  Completed   HPV VACCINES  Aged Out   Meningococcal B Vaccine  Aged Out    Health Maintenance  Health Maintenance Due  Topic Date Due   Zoster Vaccines- Shingrix (1 of 2) Never done   Colonoscopy  10/06/2022   COVID-19 Vaccine (1 - 2024-25 season) Never done   MAMMOGRAM  07/21/2023   Health Maintenance Items Addressed: Mammogram ordered, Referral sent to GI for colonoscopy  Additional Screening:  Vision Screening: Recommended annual ophthalmology exams for early detection of glaucoma and other disorders of the eye.  Dental Screening: Recommended annual dental exams for proper oral hygiene  Community Resource Referral / Chronic Care Management: CRR required this visit?  No   CCM required this visit?  No   Plan:    I have personally reviewed and noted the following in the patient's chart:   Medical and social history Use of alcohol, tobacco or illicit drugs  Current medications and supplements including opioid prescriptions. Patient is not currently taking opioid prescriptions. Functional ability and status Nutritional status Physical activity Advanced directives List of other physicians Hospitalizations, surgeries, and ER visits in previous 12 months Vitals Screenings to include cognitive, depression, and falls Referrals and appointments  In addition, I have reviewed and discussed with patient certain preventive protocols, quality metrics, and best practice recommendations. A written personalized care plan for preventive services as well as general preventive health recommendations were provided to patient.   Margette Sheldon, LPN   1/61/0960   After Visit  Summary: (MyChart) Due to this being a telephonic visit, the after visit summary with patients personalized plan was offered to patient via MyChart   Notes: Colonoscopy and Mammogram referral placed during this visit.

## 2024-01-15 NOTE — Patient Instructions (Addendum)
 Ms. Linz , Thank you for taking time out of your busy schedule to complete your Annual Wellness Visit with me. I enjoyed our conversation and look forward to speaking with you again next year. I, as well as your care team,  appreciate your ongoing commitment to your health goals. Please review the following plan we discussed and let me know if I can assist you in the future. Your Game plan/ To Do List    Referrals: If you haven't heard from the office you've been referred to, please reach out to them at the phone provided.   Follow up Visits: Next Medicare AWV with our clinical staff: 01/21/2024 at 4:50 pm Phone Visit   Have you seen your provider in the last 6 months (3 months if uncontrolled diabetes)? Yes Next Office Visit with your provider: 01/22/2024 at 2:30 pm Office Visit  Clinician Recommendations:  Aim for 30 minutes of exercise or brisk walking, 6-8 glasses of water , and 5 servings of fruits and vegetables each day.       This is a list of the screening recommended for you and due dates:  Health Maintenance  Topic Date Due   Zoster (Shingles) Vaccine (1 of 2) Never done   Colon Cancer Screening  10/06/2022   COVID-19 Vaccine (1 - 2024-25 season) Never done   Mammogram  07/21/2023   Flu Shot  04/04/2024   Medicare Annual Wellness Visit  01/14/2025   Pap with HPV screening  11/08/2027   DTaP/Tdap/Td vaccine (2 - Td or Tdap) 01/03/2028   Pneumococcal Vaccination  Completed   Hepatitis C Screening  Completed   HIV Screening  Completed   HPV Vaccine  Aged Out   Meningitis B Vaccine  Aged Out    Advanced directives: (Declined) Advance directive discussed with you today. Even though you declined this today, please call our office should you change your mind, and we can give you the proper paperwork for you to fill out. Advance Care Planning is important because it:  [x]  Makes sure you receive the medical care that is consistent with your values, goals, and preferences  [x]  It  provides guidance to your family and loved ones and reduces their decisional burden about whether or not they are making the right decisions based on your wishes.  Follow the link provided in your after visit summary or read over the paperwork we have mailed to you to help you started getting your Advance Directives in place. If you need assistance in completing these, please reach out to us  so that we can help you!  See attachments for Preventive Care and Fall Prevention Tips.

## 2024-01-22 ENCOUNTER — Encounter: Payer: Self-pay | Admitting: Family Medicine

## 2024-01-22 ENCOUNTER — Ambulatory Visit: Attending: Family Medicine | Admitting: Family Medicine

## 2024-01-22 VITALS — BP 86/61 | HR 87 | Ht 63.0 in | Wt 95.0 lb

## 2024-01-22 DIAGNOSIS — J9611 Chronic respiratory failure with hypoxia: Secondary | ICD-10-CM

## 2024-01-22 DIAGNOSIS — G7111 Myotonic muscular dystrophy: Secondary | ICD-10-CM

## 2024-01-22 DIAGNOSIS — I441 Atrioventricular block, second degree: Secondary | ICD-10-CM

## 2024-01-22 DIAGNOSIS — I959 Hypotension, unspecified: Secondary | ICD-10-CM | POA: Diagnosis not present

## 2024-01-22 DIAGNOSIS — Z931 Gastrostomy status: Secondary | ICD-10-CM

## 2024-01-22 DIAGNOSIS — E890 Postprocedural hypothyroidism: Secondary | ICD-10-CM

## 2024-01-22 MED ORDER — GERITOL TONIC PO LIQD: 10.0000 mL | Freq: Two times a day (BID) | ORAL | 1 refills | Status: AC

## 2024-01-22 NOTE — Patient Instructions (Signed)
 [facility]: Address: [address], [place], [place] [address] Hours:  Open ? Closes 5?PM Phone: [phone]

## 2024-01-22 NOTE — Progress Notes (Addendum)
 Subjective:  Patient ID: Cynthia Underwood, female    DOB: 1970/09/13  Age: 53 y.o. MRN: [id number]  CC: Medical Management of Chronic Issues (Oxygen  recert/Discuss Vitamins)     Discussed the use of AI scribe software for clinical note transcription with the patient, who gave verbal consent to proceed.  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  History of PEG tube and  history of trach tube (status post removal), bronchiectasis. who presents for recertification of oxygen  therapy and medication management.  She uses three liters of oxygen , increasing to four liters when experiencing leg pain. She feels tired and relies on a PEG tube for nutrition. Pulse oximetry on room air is 94% and on exertion is 88%, on 3 L of oxygen  is 97%.   Her blood pressure is low, with recent readings of 80/57 and 86/61. She takes metoprolol  and spironolactone . She experiences no lightheadedness or dizziness. She has not seen her electrophysiologist Dr. Rodolfo Clan since 07/2023 and has also not seen her cardiologist Dr. Starr Eddy in a while.  She is under the care of a neurologist but is not on any medications from her. She continues to take levothyroxine  for hypothyroidism and has received refills for this month.  She is interested in taking liquid vitamins due to difficulty with pill forms and is considering options like Geritol. She cannot use the stool test kit for colon cancer screening due to unformed stools and is awaiting an appointment for a colonoscopy.  Mammogram appointment comes up tomorrow.    Past Medical History:  Diagnosis Date   Bifascicular block 10/02/2014    Cataract    Dyspnea    Excess ear wax    Multinodular goiter    Pneumonia    Presence of permanent cardiac pacemaker    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   Sickle cell trait (HCC)    Watery eyes    Left   Weakness of both legs     Past Surgical History:  Procedure Laterality Date   BREAST BIOPSY Right    EYE SURGERY Left 2017   "related to blockage in my nose"   EYE SURGERY Right 05/2019   INSERT / REPLACE / REMOVE PACEMAKER  02/15/2017   IR GASTROSTOMY TUBE MOD SED  01/01/2023   IR REPLACE G-TUBE SIMPLE WO FLUORO  12/13/2023   PACEMAKER IMPLANT N/A 02/15/2017   Procedure: Pacemaker Implant;  Surgeon: Verona Goodwill, MD;  Location: [facility] INVASIVE CV LAB;  Service: Cardiovascular;  Laterality: N/A;   RIGHT/LEFT HEART CATH AND CORONARY ANGIOGRAPHY N/A 11/16/2022   Procedure: RIGHT/LEFT HEART CATH AND CORONARY ANGIOGRAPHY;  Surgeon: Darlis Eisenmenger, MD;  Location: [facility] INVASIVE CV LAB;  Service: Cardiovascular;  Laterality: N/A;   THYROIDECTOMY N/A 04/21/2021   Procedure: TOTAL THYROIDECTOMY;  Surgeon: Shela Derby, MD;  Location: [facility] OR;  Service: General;  Laterality: N/A;    Family History  Problem Relation Age of Onset   Cancer Father 69       Deceased   Heart disease Father    COPD Mother    Diabetes Maternal Uncle    Heart disease Maternal Uncle    Heart disease Paternal Grandmother    Diabetes  Paternal Grandmother    Hypertension Paternal Grandmother    Neuromuscular disorder Maternal Aunt        Myotonic dystrophy   Neuromuscular disorder Cousin        Myotonic dystrophy   Neuromuscular disorder Sister        Myotonic dystrophy   Colon cancer Neg Hx    Colon polyps Neg Hx    Esophageal cancer Neg Hx    Rectal cancer Neg Hx    Stomach cancer Neg Hx     Social History   Socioeconomic History   Marital status: Single    Spouse name: Not on file   Number of children: 0   Years of education: 1.5 Collge   Highest  education level: Some college, no degree  Occupational History   Occupation: Designer, industrial/product: OTHER  Tobacco Use   Smoking status: Former    Current packs/day: 0.00    Types: Cigarettes    Start date: 10/04/1988    Quit date: 10/04/2016    Years since quitting: 7.3   Smokeless tobacco: Never  Vaping Use   Vaping status: Never Used  Substance and Sexual Activity   Alcohol use: Not Currently    Alcohol/week: 0.0 standard drinks of alcohol    Comment:  few times a year   Drug use: Not Currently    Types: Marijuana    Comment: occ    Sexual activity: Not Currently    Birth control/protection: None, Condom  Other Topics Concern   Not on file  Social History Narrative   Patient lives at home with her aunt.   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  She moved to [facility] in August 2015.   She has been on disability since 2010.   Education: 1 1/2 years of college.   Caffeine - coke 2 cans/day   Exercise - no   Social Drivers of Corporate investment banker Strain: Low Risk  (01/15/2024)   Overall Financial Resource Strain (CARDIA)    Difficulty of Paying Living Expenses: Not hard at all  Food Insecurity: No Food Insecurity (01/15/2024)   Hunger Vital Sign    Worried About Running Out of Food in the Last Year: Never true    Ran Out of Food in the Last Year: Never true  Transportation Needs: No Transportation Needs (01/15/2024)   PRAPARE - Administrator, Civil Service (Medical): No    Lack of Transportation (Non-Medical): No  Physical Activity: Patient Declined (01/15/2024)   Exercise Vital Sign    Days of Exercise per Week: Patient declined    Minutes of Exercise per Session: Patient declined  Stress: No Stress Concern Present (01/15/2024)   [employer] of Occupational Health - Occupational Stress Questionnaire    Feeling of Stress : Only a little  Social Connections: Unknown (01/15/2024)   Social Connection and Isolation Panel [NHANES]     Frequency of Communication with Friends and Family: More than three times a week    Frequency of Social Gatherings with Friends and Family: Twice a week    Attends Religious Services: Patient declined    Database administrator or Organizations: No    Attends Banker Meetings: Never    Marital Status: Never married    Allergies  Allergen Reactions   Penicillin G Nausea And Vomiting, Rash and Other (See Comments)    Sore throat Has had cephalosporins in past   Penicillins Nausea And Vomiting,  Rash and Other (See Comments)    Fever Has patient had a PCN reaction causing immediate rash, facial/tongue/throat swelling, SOB or lightheadedness with hypotension: Yes Has patient had a PCN reaction causing severe rash involving mucus membranes or skin necrosis: Unknown Has patient had a PCN reaction that required hospitalization: No Has patient had a PCN reaction occurring within the last 10 years: No If all of the above answers are "NO", then may proceed with Cephalosporin use.     Outpatient Medications Prior to Visit  Medication Sig Dispense Refill   acetaminophen  (TYLENOL ) 500 MG tablet Place 500 mg into feeding tube every 6 (six) hours as needed for mild pain.     guaifenesin  (HUMIBID E) 400 MG TABS tablet 400 mg 3 (three) times daily. Place 400mg  into feeding tube three times a day     ipratropium-albuterol  (DUONEB) 0.5-2.5 (3) MG/3ML SOLN Inhale 3 mLs into the lungs every 4 (four) hours as needed. SMARTSIG:1 Unspecified Via Inhaler Every 4 Hours PRN 360 mL 3   levothyroxine  (SYNTHROID ) 100 MCG tablet Take 1 tablet (100 mcg total) by mouth daily before breakfast. Must have office visit for refills 30 tablet 0   metoprolol  tartrate (LOPRESSOR ) 25 MG tablet Place 0.5 tablets (12.5 mg total) into feeding tube 2 (two) times daily. 180 tablet 0   Misc. Devices MISC Eval & titrate pt for poc for portable oxygen  container. If patient qualifies, dispense at 1-6 pulse dose. Please  fax to [phone]. Diagnosis-aspiration pneumonia 1 each 0   Misc. Devices MISC Shower chair.  Diagnosis myotonic dystrophy 1 each 0   Nutritional Supplements (FEEDING SUPPLEMENT, JEVITY 1.5 CAL/FIBER,) LIQD Place 237 mLs into feeding tube 4 (four) times daily. FWF before and after each feeding 1000 mL 12   spironolactone  (ALDACTONE ) 25 MG tablet PLACE 0.5 TABLETS (12.5 MG TOTAL) INTO FEEDING TUBE DAILY. 45 tablet 1   Water  For Irrigation, Sterile (FREE WATER ) SOLN 100ml free water  via tube before and after each feeding     No facility-administered medications prior to visit.     ROS Review of Systems  Constitutional:  Negative for activity change and appetite change.  HENT:  Negative for sinus pressure and sore throat.   Respiratory:  Negative for chest tightness, shortness of breath and wheezing.   Cardiovascular:  Negative for chest pain and palpitations.  Gastrointestinal:  Negative for abdominal distention, abdominal pain and constipation.  Genitourinary: Negative.   Musculoskeletal: Negative.   Psychiatric/Behavioral:  Negative for behavioral problems and dysphoric mood.     Objective:  BP (!) 86/61   Pulse 87   Ht 5\' 3"  (1.6 m)   Wt 95 lb (43.1 kg)   LMP 12/03/2016 (Approximate)   SpO2 94%   BMI 16.83 kg/m      01/22/2024    2:43 PM 01/15/2024    4:56 PM 12/04/2023    2:07 PM  BP/Weight  Systolic BP 86    Diastolic BP 61    Wt. (Lbs) 95 90 93  BMI 16.83 kg/m2 15.94 kg/m2 16.47 kg/m2      Physical Exam Constitutional:      Appearance: She is well-developed.     Comments: Underweight  HENT:     Nose:     Comments: On 3 L oxygen  via nasal cannula Cardiovascular:     Rate and Rhythm: Normal rate.     Heart sounds: Normal heart sounds. No murmur heard. Pulmonary:     Effort: Pulmonary effort is normal.  Breath sounds: Normal breath sounds. No wheezing or rales.  Chest:     Chest wall: No tenderness.  Abdominal:     General: Bowel sounds are  normal. There is no distension.     Palpations: Abdomen is soft. There is no mass.     Tenderness: There is no abdominal tenderness.     Comments: PEG tube in place  Musculoskeletal:        General: Normal range of motion.     Right lower leg: No edema.     Left lower leg: No edema.  Neurological:     Mental Status: She is alert and oriented to person, place, and time.  Psychiatric:        Mood and Affect: Mood normal.        Latest Ref Rng & Units 08/09/2023    8:38 PM 05/28/2023    2:54 PM 05/24/2023    4:56 AM  CMP  Glucose 70 - 99 mg/dL 454   85   BUN 6 - 20 mg/dL 12   <5   Creatinine 0.98 - 1.00 mg/dL 1.19   1.47   Sodium 829 - 145 mmol/L 141   142   Potassium 3.5 - 5.1 mmol/L 4.8  4.3  4.1   Chloride 98 - 111 mmol/L 104   103   CO2 22 - 32 mmol/L 28   29   Calcium  8.9 - 10.3 mg/dL 9.6   9.2   Total Protein 6.5 - 8.1 g/dL 7.5     Total Bilirubin <1.2 mg/dL 0.9     Alkaline Phos 38 - 126 U/L 51     AST 15 - 41 U/L 34     ALT 0 - 44 U/L 21       Lipid Panel     Component Value Date/Time   CHOL 214 (H) 01/10/2018 0847   TRIG 113 01/10/2018 0847   HDL 57 01/10/2018 0847   CHOLHDL 3.8 01/10/2018 0847   CHOLHDL 3.5 08/26/2014 1516   VLDL 20 08/26/2014 1516   LDLCALC 134 (H) 01/10/2018 0847    CBC    Component Value Date/Time   WBC 9.3 08/09/2023 2038   RBC 4.45 08/09/2023 2038   HGB 14.0 08/09/2023 2038   HGB 12.6 10/04/2022 1638   HCT 43.1 08/09/2023 2038   HCT 37.6 10/04/2022 1638   PLT PLATELET CLUMPS NOTED ON SMEAR, UNABLE TO ESTIMATE 08/09/2023 2038   PLT 192 10/04/2022 1638   MCV 96.9 08/09/2023 2038   MCV 94 10/04/2022 1638   MCH 31.5 08/09/2023 2038   MCHC 32.5 08/09/2023 2038   RDW 14.5 08/09/2023 2038   RDW 15.1 10/04/2022 1638   LYMPHSABS 1.9 08/09/2023 2038   LYMPHSABS 2.1 10/04/2022 1638   MONOABS 0.4 08/09/2023 2038   EOSABS 0.1 08/09/2023 2038   EOSABS 0.1 10/04/2022 1638   BASOSABS 0.0 08/09/2023 2038   BASOSABS 0.0 10/04/2022 1638     Lab Results  Component Value Date   HGBA1C 5.6 11/15/2022   Lab Results  Component Value Date   TSH CANCELED 09/04/2023       1. Postoperative hypothyroidism (Primary) Currently on levothyroxine  Will send off thyroid  panel and adjust regimen accordingly Unfortunately obtaining a blood draw has been challenging and she will have to go to the [facility] center to have this drawn - T4, free; Future - TSH; Future - T3; Future  2. S/P percutaneous endoscopic gastrostomy (PEG) tube placement (HCC) Continue with tube feeds  3. Myotonic  dystrophy, type 1 (HCC) Continue to follow-up with neurology - Basic Metabolic Panel; Future  4. Mobitz type 2 second degree heart block Status post pacemaker placement Not recently seen by EP and cardiology and has been advised to schedule an appointment  5. Chronic respiratory failure with hypoxia (HCC) Remains on 3 L of oxygen  at rest Oxygen  recertification performed  6.  Hypotension Soft blood pressure - asymptomatic She is currently on metoprolol  and spironolactone  I have sent a message to her cardiologist Dr. Starr Eddy to determine if we can discontinue her antihypertensive or if she is a candidate for midodrine   Meds ordered this encounter  Medications   Iron-Vitamins (GERITOL) LIQD    Sig: Take 10 mLs by mouth 2 (two) times daily.    Dispense:  354 mL    Refill:  1    Follow-up: Return in about 6 months (around 07/24/2024) for Chronic medical conditions, cancel previous appointment.   Addendum 01/23/2024: Received message from Dr. Rodolfo Clan that her metoprolol  can be discontinued due to hypotension and she will be placed on the schedule with one of the clinicians at the clinic.  I informed the patient about this on the phone.    Joaquin Mulberry, MD, FAAFP. [facility] [place], [place] [phone]   01/22/2024, 5:53 PM

## 2024-01-23 ENCOUNTER — Ambulatory Visit
Admission: RE | Admit: 2024-01-23 | Discharge: 2024-01-23 | Disposition: A | Discharge status: Home / Self Care | Source: Ambulatory Visit | Attending: Family Medicine | Admitting: Family Medicine

## 2024-01-23 DIAGNOSIS — Z1231 Encounter for screening mammogram for malignant neoplasm of breast: Secondary | ICD-10-CM | POA: Diagnosis not present

## 2024-01-23 DIAGNOSIS — Z1239 Encounter for other screening for malignant neoplasm of breast: Secondary | ICD-10-CM

## 2024-01-24 ENCOUNTER — Telehealth: Payer: Self-pay | Admitting: Family Medicine

## 2024-01-24 NOTE — Telephone Encounter (Signed)
 FYI

## 2024-01-24 NOTE — Telephone Encounter (Signed)
 FYI Copied from CRM [id number]. Topic: General - Other >> Jan 24, 2024  2:54 PM Santiya F wrote:  Reason for CRM: Patient is calling in to speak with Dr. Newlin. Patient wants to let her know that she wont be able to complete her labs for [facility] until next Tuesday the 27.

## 2024-01-26 ENCOUNTER — Ambulatory Visit: Payer: Self-pay | Admitting: Family Medicine

## 2024-02-05 ENCOUNTER — Other Ambulatory Visit: Payer: Self-pay

## 2024-02-05 NOTE — Patient Outreach (Addendum)
 Complex Care Management   Visit Note  02/05/2024  Name:  Cynthia Underwood MRN: [id number] DOB: 11-26-1970  Situation: Referral received for Complex Care Management related to Impaired Speech and Impaired Swallowing I obtained verbal consent from Patient.  Visit completed with Cynthia Underwood  on the phone. She had PCP appt 01/22/24, metoprolol  was stopped due to low BP's, Geritol liquid vitamins added, and patient was able to get blood work performed at [facility] yesterday (02/04/24).    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   Sickle cell trait (HCC)    Watery eyes    Left   Weakness of both legs     Assessment: Patient Reported Symptoms:  Cognitive Cognitive Status: Alert and oriented to person, place, and time, Normal speech and language skills, Insightful and able to interpret abstract concepts (Patient speaks with quiet voice due to impaired speech and swallowing) Cognitive/Intellectual Conditions Management [RPT]: None reported or documented in medical history or problem list   Healing Pattern: Unsure  Neurological Neurological Review of Symptoms: No symptoms reported    HEENT HEENT Symptoms Reported: No symptoms reported      Cardiovascular Cardiovascular Symptoms Reported: No symptoms reported    Respiratory Respiratory Symptoms Reported: No symptoms reported    Endocrine Patient reports the following symptoms related to hypoglycemia or hyperglycemia : Not assessed Is patient diabetic?: No    Gastrointestinal Gastrointestinal Symptoms Reported: No symptoms reported Enteral Nutrition Time Frame: [place] Gastrointestinal Self-Management Outcome: 4 (good) Nutrition Risk Screen (CP): Difficulty chewing/swallowing, Tube feeding or  parenteral nutrition  Genitourinary Genitourinary Symptoms Reported: No symptoms reported    Integumentary Integumentary Symptoms Reported: No symptoms reported    Musculoskeletal Musculoskelatal Symptoms Reviewed: Unsteady gait   Falls in the past year?: No    Psychosocial Psychosocial Symptoms Reported: No symptoms reported            01/22/2024    2:46 PM  Depression screen PHQ 2/9  Decreased Interest 0  Down, Depressed, Hopeless 0  PHQ - 2 Score 0  Altered sleeping 2  Tired, decreased energy 2  Change in appetite 0  Feeling bad or failure about yourself  0  Trouble concentrating 0  Moving slowly or fidgety/restless 0  Suicidal thoughts 0  PHQ-9 Score 4    There were no vitals filed for this visit.  Medications Reviewed Today     Reviewed by Isadore Marble, RN (Registered Nurse) on 02/05/24 at 1513  Med List Status: <None>   Medication Order Taking? Sig Documenting Provider Last Dose Status Informant  acetaminophen  (TYLENOL ) 500 MG tablet [id number]  Place 500 mg into feeding tube every 6 (six) hours as needed for mild pain. [provider]  Active Family Member  guaifenesin  (HUMIBID E) 400 MG TABS tablet [id number]  400 mg 3 (three) times daily. Place 400mg  into feeding tube three times a day [provider]  Active Family Member  ipratropium-albuterol  (DUONEB) 0.5-2.5 (3) MG/3ML SOLN [id number]  Inhale 3 mLs into the lungs every 4 (four) hours as needed. SMARTSIG:1 Unspecified Via Inhaler Every 4 Hours PRN Wilfredo Hanly, MD  Active Family Member  Iron-Vitamins [place]) [place] [id number]  Take 10 mLs by mouth 2 (two)  times daily. Newlin, Enobong, MD  Active            Med Note Gomez Lathe, Yoko Mcgahee A   Tue Feb 05, 2024  3:13 PM) She will be picking up from pharmacy when it is back in stock.   levothyroxine  (SYNTHROID ) 100 MCG tablet [id number]  Take 1 tablet (100 mcg total) by mouth daily before breakfast. Must have office visit for refills Newlin, Enobong,  MD  Active   metoprolol  tartrate (LOPRESSOR ) 25 MG tablet [id number] No Place 0.5 tablets (12.5 mg total) into feeding tube 2 (two) times daily.  Patient not taking: Reported on 02/05/2024   Newlin, Enobong, MD Not Taking Active   Misc. Devices MISC [id number]  Eval & titrate pt for poc for portable oxygen  container. If patient qualifies, dispense at 1-6 pulse dose. Please fax to [phone]. Diagnosis-aspiration pneumonia Joaquin Mulberry, MD  Active   Misc. Devices MISC [id number]  Shower chair.  Diagnosis myotonic dystrophy Joaquin Mulberry, MD  Active   Nutritional Supplements (FEEDING SUPPLEMENT, JEVITY 1.5 CAL/FIBER,) LIQD [id number]  Place 237 mLs into feeding tube 4 (four) times daily. FWF before and after each feeding Newlin, Enobong, MD  Active   spironolactone  (ALDACTONE ) 25 MG tablet [id number]  PLACE 0.5 TABLETS (12.5 MG TOTAL) INTO FEEDING TUBE DAILY. Newlin, Enobong, MD  Active   Water  For Irrigation, Sterile (FREE WATER ) SOLN [id number]  free water  via tube before and after each feeding Audria Leather, MD  Active             Recommendation:   Continue Current Plan of Care -She has an upcoming EGD scheduled 02/19/24 -PCP collaborated with Dr. Klein/cardiology to stop metoprolol  due to low BP's - patient has stopped as ordered.   Follow Up Plan:   Telephone follow-up : 02/20/24  Jurline Olmsted BSN, CCM [place]  [facility] RN Care Manager Direct Dial: [phone]  Fax: [phone]

## 2024-02-05 NOTE — Patient Instructions (Addendum)
 Visit Information  Thank you for taking time to visit with me today. Please don't hesitate to contact me if I can be of assistance to you before our next scheduled appointment.  Please take you blood pressure and pulse daily since stopping Metoprolol  and log to show doctor at your next Cardiology appointment.  Your next care management appointment is by telephone on Wednesday, June 18th  at 2:00pm   Please call the care guide team at [phone] if you need to cancel, schedule, or reschedule an appointment.   Reminder to ALL patients/family/friends, please call the [place]  National Suicide Prevention Lifeline: 775-071-4469 or TTY: 951-408-8990 TTY (862)168-7881) to talk to a trained counselor if you are experiencing a Mental Health or Behavioral Health Crisis or need someone to talk to.  Jurline Olmsted BSN, CCM [place]  [facility] Population Health RN Care Manager Direct Dial: [phone]  Fax: [phone]

## 2024-02-12 ENCOUNTER — Other Ambulatory Visit: Payer: Self-pay

## 2024-02-12 ENCOUNTER — Telehealth: Payer: Self-pay

## 2024-02-12 ENCOUNTER — Encounter (HOSPITAL_COMMUNITY): Payer: Self-pay | Admitting: Gastroenterology

## 2024-02-12 ENCOUNTER — Other Ambulatory Visit: Payer: Self-pay | Admitting: Family Medicine

## 2024-02-12 NOTE — Progress Notes (Signed)
 Cynthia Underwood  PCP-Newlin MD Cardiologist-Klein MD PulmonologistDiania Fortes MD  EKG-07/10/23 Echo-05/21/23 Cath-11/16/22 Stress-2018 ICD/PM-PM GLP1-n/a Blood Thinner- n/a   HX: RBBB,Sickle cell trait, Resp failure, PEG, Vfib arrest, Myotonic dystrophy. Patient last saw her pulm MD in 07/2023, f/u 4-19months, on 3L02. Last visit with her EP MD was 07/10/23. She did see her PCP 01/22/24 and they did mention she hadnt seen her EP or cardiologist in a while and her BP was running low so they recommended her call them to make an appt. Per pt she said they've called but no appt yet. Currently uses 3L02 and walker, no new issues, feels her breathing is good unless has to walk a lot.  Anesthesia Review- Yes-Needs medical cardiac clearance, office notified 6/10.

## 2024-02-12 NOTE — Telephone Encounter (Signed)
 [facility] Pre-operative Risk Assessment     Request for surgical clearance:     Endoscopy Procedure  What type of surgery is being performed?     EGD  When is this surgery scheduled?     02/19/24  What type of clearance is required ?  Medical Are there any medications that need to be held prior to surgery and how long? No  Practice name and name of physician performing surgery?      [facility] by Dr Leonia Raman  What is your office phone and fax number?      Phone- [phone]  Fax- [phone]  Anesthesia type (None, local, MAC, general) ?       MAC   Please route your response to Heber Little, LPN

## 2024-02-13 ENCOUNTER — Telehealth: Payer: Self-pay | Admitting: Family Medicine

## 2024-02-13 ENCOUNTER — Other Ambulatory Visit: Payer: Self-pay

## 2024-02-13 NOTE — Telephone Encounter (Signed)
 Noted

## 2024-02-13 NOTE — Telephone Encounter (Signed)
 Copied from CRM [id number]. Topic: General - Other >> Feb 13, 2024 12:54 PM Shardie S wrote:  Reason for CRM: Athena Bland with [employer] is requesting to have forms returned. Documents were received on 02/07/2024 Fax# [phone] Callback # [phone]

## 2024-02-13 NOTE — Telephone Encounter (Signed)
 Preop clearance appt now scheduled, patient agreed to appt date and time.

## 2024-02-14 NOTE — Progress Notes (Signed)
 Cardiology Office Note:  .   Date:  02/14/2024  ID:  Cynthia Underwood, DOB [birth year], MRN [id number] PCP: Joaquin Mulberry, MD  [facility] Providers Cardiologist:  Gaylyn Keas, MD Electrophysiologist:  Richardo Chandler, MD {  History of Present Illness: Cynthia Underwood is a 53 y.o. female w/PMHx of  COPD on home O2, DM, Myotonic dystrophy type I Goiter > thyroidectomy > hypothyroidism Sickle cell trait RBBB Advanced heart block w/PPM  FAMILY > father also had heart transplant. Has family history of sarcoidosis  PATIENT >  cMRI in 2017 that showed myocardial thinning associated with hypokinesis and subendocardial late gadolinium enhancement in the mid inferior and ineferolateral walls. She was referred for FDG-PET that was negative for evidence of sarcoidosis   Hospitalized 11/11/2022 with hypoxic respiratory failure , LLL CAP > suffered cardiac arrest/VF 11/14/22 Echo w/biventricular failure. LVEF severely reduced < 20% w/ diffuse HK. RV severely reduced.  [facility] 11/16/2022 showed normal cors, NICM, near normal filling pressures and PA pressure, Felt to have excellent CO despite discrepancy between Thermo and Fick COs.  EP brought on board Dr. Rodolfo Clan  VF arrest began with a long-short cycle with a coupling interval of > 600 ms, which is highly sensitive and speicific for torsades.   Given use of azithromycin  and potassium depletion, would consider this a reversible cause.  QT on EKG 3/14 remains quiet long at ~560-570.    LRL increased to 90 to overdrive pace During her stay required re-intubation felt secondary to respiratory muscle weakness 2/2 her dustrophy Once QT improved LRL reduced and intervals extended to try and promote intrinsic conduction (reduce RV pacing) ULTIMATELY required [facility] Recurrent need for vent support  Discharged 12/06/22 >> to [facility] vent/trach, cortrack   Admitted 01/22/23 > 01/30/23 NOTE of vent/trach de cannulated at this time Aspiration  PNA Chronic hypoxic resp failure Diarrhea  Admitted 05/20/23 > 05/25/23 Again aspiration PNA Sepsis Repeat echo showed EF of 55 to 60%.   OUT PATIENT Her only cards f/u was a single visit with Dr. Rodolfo Clan on 07/10/23 Her AV delays shortened to improve hemodynamics  + remotes  Pending EGD 02/19/24 RCRI score is one (0.9%)   Today's visit is scheduled as a pre-procedure evaluation ROS:   She denies any cardiac/PPM concerns Denies CP, palpitations or cardiac awareness of any kind Ambulates with a walker at her baseline and wears O2 with ambulation, not at rest or HS, this also her baseline She denies any changes to her exertional capacity No dizzy spells, near syncope or syncope  She was having low BPs and her metoprolol  stopped ~ 3 weeks or so ago Denies any unusual weakness, symptoms    Device information MDT dual chamber PPM implanted 02/15/2017 RV lead reported to be HIS position with septal capture    Studies Reviewed: Aaron Aas    EKG done today and reviewed by myself:  AV paced 79bpm, QT given her paced QRS duration is OK  DEVICE interrogation done today and reviewed by myself Battery and lead measurements are good RV threshold is <1.0/1.0 (chronic PW programmed at 1.0) > given hx, did not bring her to full LOC She has episodes labeled/binned in AF Markers only for most, some EGMs None appear to be true AF Some clearly are FF on the A channel as well as PACs No HVR episodes  05/21/2023: TTE 1. Left ventricular ejection fraction, by estimation, is 55 to 60%. The  left ventricle has normal function. The left  ventricle has no regional  wall motion abnormalities. Left ventricular diastolic parameters were  normal.   2. Right ventricular systolic function is normal. The right ventricular  size is normal. There is normal pulmonary artery systolic pressure.   3. A small pericardial effusion is present.   4. The mitral valve is normal in structure. No evidence of mitral valve   regurgitation. No evidence of mitral stenosis.   5. The aortic valve is tricuspid. Aortic valve regurgitation is not  visualized. No aortic stenosis is present.   6. The inferior vena cava is normal in size with greater than 50%  respiratory variability, suggesting right atrial pressure of 3 mmHg.    [facility] 11/16/2022 - Normal cors, near normal filling pressures, good CO, though difference between pick and thermo   Echo 11/15/2022 - LVEF < 20%, severely reduced RV, trivial MR, RAP ~ 15 mm Hg   CT Angio PE 11/11/2022 - No PE, few patchy ground-glass opacities in RML and BLL. Mild LL bronchial thickening and bronchiolectasis, small pericardial effusion   Echo 11/14/21 1. LVEF is severely depressed with diffuse hypokinesis;  inferior/inferolateral akinesis Compared to echo from 2022 this is a  significant change. . Left ventricular ejection fraction, by estimation,  is 20%. The left ventricle has severely decreased  function.   2. Right ventricular systolic function is severely reduced. The right  ventricular size is normal. There is normal pulmonary artery systolic  pressure.   3. The mitral valve is normal in structure. Trivial mitral valve  regurgitation.   4. The aortic valve is normal in structure. Aortic valve regurgitation is  not visualized.   5. The inferior vena cava is dilated in size with <50% respiratory  variability, suggesting right atrial pressure of 15 mmHg.     Risk Assessment/Calculations:    Physical Exam:   VS:  LMP 12/03/2016 (Approximate)    Wt Readings from Last 3 Encounters:  01/22/24 95 lb (43.1 kg)  01/15/24 90 lb (40.8 kg)  12/04/23 93 lb (42.2 kg)    GEN: Well nourished, somewhat chronically ill appearing, very thin body habitus NECK: No JVD; No carotid bruits CARDIAC: RRR, no murmurs, rubs, gallops RESPIRATORY:  CTA b/l without rales, wheezing or rhonchi  ABDOMEN: feeding tube in place appears clean and dry EXTREMITIES:  No edema; No deformity    PPM site: is stable, no thinning, fluctuation, tethering, she is very thin, no signs of errosion  ASSESSMENT AND PLAN: .    PPM intact function She is dependent today at 45 RV output increased to 2.5V (clinic standard)   Hx of VF arrest In the environment of meds > azithromycin /QT prolonging and hypokalemia Felt reversible cause Will get a BMET today, check K+ Discussed her hx of prolonged QT felt to be triggered by low K+ and medications I have added prolonged QT to her diagnosis list so it flags for meds in our system  NICM Recovered LVEF No symptoms or exam findings to suggest change  4.  Pre-procedure evaluation Pending EGD purely from a cardiac perspective She has no CAD Her LVEF by her echo Sept 2024 had normalized/recovered RCRI score is one (0.9%) no cardiac contraindication to an EGD no particular pacer management necessary for EGD though I think given her co morbid conditions she is a higher risk patient for procedures/surgeries > medical clearance from her other medical conditions is deferred to her PMD AVOID QT PROLONGING DRUGS   Dispo: remotes as usual, back in clinic with EP again  in a year, sooner if needed  Signed, Debbie Fails, PA-C

## 2024-02-15 ENCOUNTER — Ambulatory Visit: Attending: Physician Assistant | Admitting: Physician Assistant

## 2024-02-15 ENCOUNTER — Telehealth: Payer: Self-pay | Admitting: Physician Assistant

## 2024-02-15 ENCOUNTER — Telehealth: Payer: Self-pay

## 2024-02-15 ENCOUNTER — Encounter: Payer: Self-pay | Admitting: Physician Assistant

## 2024-02-15 VITALS — BP 112/60 | HR 79 | Ht 63.0 in | Wt 97.8 lb

## 2024-02-15 DIAGNOSIS — Z950 Presence of cardiac pacemaker: Secondary | ICD-10-CM

## 2024-02-15 DIAGNOSIS — I428 Other cardiomyopathies: Secondary | ICD-10-CM

## 2024-02-15 DIAGNOSIS — R9431 Abnormal electrocardiogram [ECG] [EKG]: Secondary | ICD-10-CM | POA: Diagnosis not present

## 2024-02-15 DIAGNOSIS — I495 Sick sinus syndrome: Secondary | ICD-10-CM | POA: Diagnosis not present

## 2024-02-15 DIAGNOSIS — E876 Hypokalemia: Secondary | ICD-10-CM

## 2024-02-15 DIAGNOSIS — I469 Cardiac arrest, cause unspecified: Secondary | ICD-10-CM

## 2024-02-15 DIAGNOSIS — Z01818 Encounter for other preprocedural examination: Secondary | ICD-10-CM | POA: Diagnosis not present

## 2024-02-15 LAB — BASIC METABOLIC PANEL WITH GFR
BUN/Creatinine Ratio: 23 (ref 9–23)
BUN: 16 mg/dL (ref 6–24)
CO2: 24 mmol/L (ref 20–29)
Calcium: 9.8 mg/dL (ref 8.7–10.2)
Chloride: 98 mmol/L (ref 96–106)
Creatinine, Ser: 0.7 mg/dL (ref 0.57–1.00)
Glucose: 88 mg/dL (ref 70–99)
Potassium: 4.4 mmol/L (ref 3.5–5.2)
Sodium: 144 mmol/L (ref 134–144)
eGFR: 103 mL/min/{1.73_m2} (ref >59)

## 2024-02-15 NOTE — Patient Instructions (Signed)
 Medication Instructions:   Your physician recommends that you continue on your current medications as directed. Please refer to the Current Medication list given to you today.  *If you need a refill on your cardiac medications before your next appointment, please call your pharmacy*   Lab Work:  PLEASE GO DOWN STAIRS  [facility]  FIRST FLOOR   ( GET OFF ELEVATORS WALK TOWARDS WAITING AREA LAB LOCATED BY PHARMACY): BMET TODAY     If you have labs (blood work) drawn today and your tests are completely normal, you will receive your results only by: MyChart Message (if you have MyChart) OR A paper copy in the mail If you have any lab test that is abnormal or we need to change your treatment, we will call you to review the results.   Testing/Procedures:  NONE ORDERED  TODAY    Follow-Up: At [facility] you and your health needs are our priority.  As part of our continuing mission to provide you with exceptional heart care, our providers are all part of one team.  This team includes your primary Cardiologist (physician) and Advanced Practice Providers or APPs (Physician Assistants and Nurse Practitioners) who all work together to provide you with the care you need, when you need it.  Your next appointment:    6 month(s)   Provider:    You may see Mertha Abrahams, PA-C     We recommend signing up for the patient portal called MyChart.  Sign up information is provided on this After Visit Summary.  MyChart is used to connect with patients for Virtual Visits (Telemedicine).  Patients are able to view lab/test results, encounter notes, upcoming appointments, etc.  Non-urgent messages can be sent to your provider as well.   To learn more about what you can do with MyChart, go to [email or web address].   Other Instructions

## 2024-02-15 NOTE — Telephone Encounter (Signed)
 Spoke with patient. The cardiology note indicates they want input from the patient's PCP to decide on the medical clearance. Patient is advised. We have taken her off the schedule for Tuesday 02/19/24 pending this decision. Telephone encounter sent to Dr Newlin with date given as to be decided. Patient has informed me she cannot have this done 04/14/24 which is the next opening on the hospital schedule.

## 2024-02-15 NOTE — Telephone Encounter (Signed)
 Pre-operative Risk Assessment     Request for surgical clearance:     Endoscopy Procedure  What type of surgery is being performed?     colonoscopy  When is this surgery scheduled?     TBD  What type of clearance is required ?   Medical  Practice name and name of physician performing surgery?      [facility] with Dr Willy Harvest  What is your office phone and fax number?      Phone- [phone]  Fax- [phone]  Anesthesia type (None, local, MAC, general) ?       MAC   Please route your response to Heber Little, LPN

## 2024-02-15 NOTE — Telephone Encounter (Signed)
 Patient called and stated that she would like to speak to [facility] regarding her procedure for Tuesday. Patient is requesting a call back. Please advise.

## 2024-02-18 ENCOUNTER — Ambulatory Visit (INDEPENDENT_AMBULATORY_CARE_PROVIDER_SITE_OTHER): Payer: Self-pay

## 2024-02-18 DIAGNOSIS — I469 Cardiac arrest, cause unspecified: Secondary | ICD-10-CM | POA: Diagnosis not present

## 2024-02-18 LAB — CUP PACEART REMOTE DEVICE CHECK
Battery Remaining Longevity: 32 mo
Battery Voltage: 2.92 V
Brady Statistic AP VP Percent: 82.17 %
Brady Statistic AP VS Percent: 0 %
Brady Statistic AS VP Percent: 17.83 %
Brady Statistic AS VS Percent: 0 %
Brady Statistic RA Percent Paced: 82.16 %
Brady Statistic RV Percent Paced: 100 %
Date Time Interrogation Session: 20250616020552
Implantable Lead Connection Status: 753985
Implantable Lead Connection Status: 753985
Implantable Lead Implant Date: 20180614
Implantable Lead Implant Date: 20180614
Implantable Lead Location: 753859
Implantable Lead Location: 753860
Implantable Lead Model: 3830
Implantable Lead Model: 5076
Implantable Pulse Generator Implant Date: 20180614
Lead Channel Impedance Value: 304 Ohm
Lead Channel Impedance Value: 361 Ohm
Lead Channel Impedance Value: 399 Ohm
Lead Channel Impedance Value: 494 Ohm
Lead Channel Pacing Threshold Amplitude: 0.75 V
Lead Channel Pacing Threshold Amplitude: 1.75 V
Lead Channel Pacing Threshold Pulse Width: 0.4 ms
Lead Channel Pacing Threshold Pulse Width: 0.4 ms
Lead Channel Sensing Intrinsic Amplitude: 2.375 mV
Lead Channel Sensing Intrinsic Amplitude: 2.375 mV
Lead Channel Sensing Intrinsic Amplitude: 4.125 mV
Lead Channel Sensing Intrinsic Amplitude: 4.125 mV
Lead Channel Setting Pacing Amplitude: 2 V
Lead Channel Setting Pacing Amplitude: 2.5 V
Lead Channel Setting Pacing Pulse Width: 1 ms
Lead Channel Setting Sensing Sensitivity: 0.9 mV
Zone Setting Status: 755011
Zone Setting Status: 755011

## 2024-02-18 NOTE — Telephone Encounter (Signed)
 At this time general clearance will need to come from cardiology due to history of second-degree heart block status post pacemaker.  Medically she is not on any medications that would affect colonoscopy.  Thanks.

## 2024-02-18 NOTE — Telephone Encounter (Signed)
 Patient calling in regards to previous note. Please advise.   Thank you

## 2024-02-18 NOTE — Telephone Encounter (Signed)
 Cardiology note and PCP note brought to the attention of Endo. Trying to get the EGD put back on the schedule for tomorrow. The patient is aware.

## 2024-02-19 ENCOUNTER — Encounter (HOSPITAL_COMMUNITY): Payer: Self-pay | Admitting: Gastroenterology

## 2024-02-19 ENCOUNTER — Ambulatory Visit: Payer: Self-pay | Admitting: Physician Assistant

## 2024-02-19 ENCOUNTER — Ambulatory Visit: Payer: Self-pay | Admitting: Cardiology

## 2024-02-19 ENCOUNTER — Ambulatory Visit (HOSPITAL_COMMUNITY): Admitting: Anesthesiology

## 2024-02-19 ENCOUNTER — Other Ambulatory Visit: Payer: Self-pay

## 2024-02-19 ENCOUNTER — Encounter (HOSPITAL_COMMUNITY)
Admission: RE | Disposition: A | Discharge status: Home / Self Care | Payer: Self-pay | Source: Home / Self Care | Attending: Gastroenterology

## 2024-02-19 DIAGNOSIS — K209 Esophagitis, unspecified without bleeding: Secondary | ICD-10-CM | POA: Diagnosis not present

## 2024-02-19 DIAGNOSIS — R1010 Upper abdominal pain, unspecified: Secondary | ICD-10-CM | POA: Diagnosis present

## 2024-02-19 DIAGNOSIS — R1013 Epigastric pain: Secondary | ICD-10-CM | POA: Insufficient documentation

## 2024-02-19 DIAGNOSIS — K3189 Other diseases of stomach and duodenum: Secondary | ICD-10-CM | POA: Diagnosis not present

## 2024-02-19 DIAGNOSIS — K31A0 Gastric intestinal metaplasia, unspecified: Secondary | ICD-10-CM

## 2024-02-19 DIAGNOSIS — D573 Sickle-cell trait: Secondary | ICD-10-CM | POA: Insufficient documentation

## 2024-02-19 DIAGNOSIS — Z931 Gastrostomy status: Secondary | ICD-10-CM | POA: Diagnosis not present

## 2024-02-19 DIAGNOSIS — Z87891 Personal history of nicotine dependence: Secondary | ICD-10-CM | POA: Insufficient documentation

## 2024-02-19 DIAGNOSIS — Z79899 Other long term (current) drug therapy: Secondary | ICD-10-CM | POA: Insufficient documentation

## 2024-02-19 DIAGNOSIS — E039 Hypothyroidism, unspecified: Secondary | ICD-10-CM

## 2024-02-19 DIAGNOSIS — J9611 Chronic respiratory failure with hypoxia: Secondary | ICD-10-CM

## 2024-02-19 DIAGNOSIS — I5043 Acute on chronic combined systolic (congestive) and diastolic (congestive) heart failure: Secondary | ICD-10-CM

## 2024-02-19 DIAGNOSIS — G8929 Other chronic pain: Secondary | ICD-10-CM | POA: Insufficient documentation

## 2024-02-19 DIAGNOSIS — I452 Bifascicular block: Secondary | ICD-10-CM | POA: Diagnosis not present

## 2024-02-19 DIAGNOSIS — K2100 Gastro-esophageal reflux disease with esophagitis, without bleeding: Secondary | ICD-10-CM | POA: Diagnosis not present

## 2024-02-19 DIAGNOSIS — R109 Unspecified abdominal pain: Secondary | ICD-10-CM | POA: Diagnosis not present

## 2024-02-19 DIAGNOSIS — Z79890 Hormone replacement therapy (postmenopausal): Secondary | ICD-10-CM | POA: Diagnosis not present

## 2024-02-19 HISTORY — PX: ESOPHAGOGASTRODUODENOSCOPY: SHX5428

## 2024-02-19 SURGERY — EGD (ESOPHAGOGASTRODUODENOSCOPY)
Anesthesia: Monitor Anesthesia Care

## 2024-02-19 MED ORDER — LIDOCAINE 2% (20 MG/ML) 5 ML SYRINGE
INTRAMUSCULAR | Status: DC | PRN
  Administered 2024-02-19: 50 mg via INTRAVENOUS

## 2024-02-19 MED ORDER — SODIUM CHLORIDE 0.9 % IV SOLN: INTRAVENOUS | Status: DC

## 2024-02-19 MED ORDER — PANTOPRAZOLE SODIUM 40 MG PO TBEC
40.0000 mg | DELAYED_RELEASE_TABLET | Freq: Every day | ORAL | 3 refills | Status: DC
Start: 2024-02-19 — End: 2024-02-19

## 2024-02-19 MED ORDER — PROPOFOL 10 MG/ML IV BOLUS
INTRAVENOUS | Status: DC | PRN
  Administered 2024-02-19: 50 mg via INTRAVENOUS

## 2024-02-19 MED ORDER — ESOMEPRAZOLE MAGNESIUM 40 MG PO PACK: 40.0000 mg | PACK | Freq: Every day | ORAL | 3 refills | Status: AC

## 2024-02-19 MED ORDER — PROPOFOL 500 MG/50ML IV EMUL
INTRAVENOUS | Status: DC | PRN
  Administered 2024-02-19: 125 ug/kg/min via INTRAVENOUS

## 2024-02-19 MED ORDER — SODIUM CHLORIDE 0.9 % IV SOLN
INTRAVENOUS | Status: AC | PRN
  Administered 2024-02-19: 500 mL via INTRAMUSCULAR

## 2024-02-19 NOTE — Discharge Instructions (Signed)

## 2024-02-19 NOTE — Op Note (Addendum)
 [facility] Patient Name: Cynthia Underwood Procedure Date: 02/19/2024 MRN: [id number] Attending MD: Sergio Dandy , MD, [id number] Date of Birth: 08-13-1971 CSN: [id number] Age: 53 Admit Type: Outpatient Procedure:                Upper GI endoscopy Indications:              Epigastric abdominal pain Providers:                Sergio Dandy, MD, Suzann Ernst, RN, Judith Novak, Technician Referring MD:              Medicines:                Monitored Anesthesia Care Complications:            No immediate complications. Estimated Blood Loss:     Estimated blood loss was minimal. Procedure:                Pre-Anesthesia Assessment:                           - Prior to the procedure, a History and Physical                            was performed, and patient medications and                            allergies were reviewed. The patient's tolerance of                            previous anesthesia was also reviewed. The risks                            and benefits of the procedure and the sedation                            options and risks were discussed with the patient.                            All questions were answered, and informed consent                            was obtained. Prior Anticoagulants: The patient has                            taken no anticoagulant or antiplatelet agents. ASA                            Grade Assessment: III - A patient with severe                            systemic disease. After reviewing the risks and  benefits, the patient was deemed in satisfactory                            condition to undergo the procedure.                           After obtaining informed consent, the endoscope was                            passed under direct vision. Throughout the                            procedure, the patient's blood pressure, pulse, and                             oxygen  saturations were monitored continuously. The                            [id number]-H190 ([id number]) Olympus endoscope was introduced                            through the mouth, and advanced to the second part                            of duodenum. The upper GI endoscopy was                            accomplished without difficulty. The patient                            tolerated the procedure well. Scope In: Scope Out: Findings:      LA Grade C (one or more mucosal breaks continuous between tops of 2 or       more mucosal folds, less than 75% circumference) esophagitis with no       bleeding was found 34 to 36 cm from the incisors.      There was evidence of an intact gastrostomy with a patent G-tube present       in the gastric antrum. This was characterized by healthy appearing       mucosa.      The exam of the stomach was otherwise normal.      A single 3 mm mucosal nodule with a localized distribution was found in       the duodenal bulb. The nodule was Paris classification Is (protruding,       sessile). Biopsies were taken with a cold forceps for histology.      The second portion of the duodenum was normal. Impression:               - LA Grade C reflux esophagitis with no bleeding.                           - Intact gastrostomy with a patent G-tube present                            characterized by healthy appearing  mucosa.                           - Mucosal nodule found in the duodenum. Biopsied.                           - Normal second portion of the duodenum. Moderate Sedation:      N/A Recommendation:           - Resume previous diet.                           - Continue present medications.                           - Await pathology results.                           - Use Nexium 40 mg packet daily X 3 months and then                            decrease to as needed for heartburn and epigastric                            pain                           - Follow an  antireflux regimen. Procedure Code(s):        --- Professional ---                           [phone], Esophagogastroduodenoscopy, flexible,                            transoral; with biopsy, single or multiple Diagnosis Code(s):        --- Professional ---                           K21.00, Gastro-esophageal reflux disease with                            esophagitis, without bleeding                           Z93.1, Gastrostomy status                           K31.89, Other diseases of stomach and duodenum                           R10.13, Epigastric pain CPT copyright 2022 American Medical Association. All rights reserved. The codes documented in this report are preliminary and upon coder review may  be revised to meet current compliance requirements. [name], MD 02/19/2024 8:58:32 AM This report has been signed electronically. Number of Addenda: 0

## 2024-02-19 NOTE — H&P (Signed)
 [facility] History and Physical   Primary Care Physician:  Joaquin Mulberry, MD   Reason for Procedure:   Epigastric abdominal pain  Plan:    EGD     HPI: Cynthia Underwood is a 53 y.o. female here for EGD for evaluation of chronic epigastric pain.  The risks and benefits as well as alternatives of endoscopic procedure(s) have been discussed and reviewed. All questions answered. The patient agrees to proceed.    Past Medical History:  Diagnosis Date   Bifascicular block 10/02/2014   Cataract    Dyspnea    Excess ear wax    Multinodular goiter    Pneumonia    Presence of permanent cardiac pacemaker    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   Sickle cell trait (HCC)    Watery eyes    Left   Weakness of both legs     Past Surgical History:  Procedure Laterality Date   BREAST BIOPSY Right    EYE SURGERY Left 2017   related to blockage in my nose   EYE SURGERY Right 05/2019   INSERT / REPLACE / REMOVE PACEMAKER  02/15/2017   IR GASTROSTOMY TUBE MOD SED  01/01/2023   IR REPLACE G-TUBE SIMPLE WO FLUORO  12/13/2023   PACEMAKER IMPLANT N/A 02/15/2017   Procedure: Pacemaker Implant;  Surgeon: Verona Goodwill, MD;  Location: [facility] INVASIVE CV LAB;  Service: Cardiovascular;  Laterality: N/A;   RIGHT/LEFT HEART CATH AND CORONARY ANGIOGRAPHY N/A 11/16/2022   Procedure: RIGHT/LEFT HEART CATH AND CORONARY ANGIOGRAPHY;  Surgeon: Darlis Eisenmenger, MD;  Location: [facility] INVASIVE CV LAB;  Service: Cardiovascular;  Laterality: N/A;   THYROIDECTOMY N/A 04/21/2021   Procedure: TOTAL THYROIDECTOMY;  Surgeon: Shela Derby, MD;  Location: [facility] OR;  Service: General;  Laterality: N/A;    Prior to Admission medications   Medication Sig Start Date End Date Taking? Authorizing Provider  levothyroxine  (SYNTHROID ) 100 MCG tablet Take 1 tablet (100 mcg total) by mouth daily before breakfast. Must have office visit for refills 01/09/24  Yes Newlin,  Enobong, MD  Nutritional Supplements (FEEDING SUPPLEMENT, JEVITY 1.5 CAL/FIBER,) LIQD Place 237 mLs into feeding tube 4 (four) times daily. FWF before and after each feeding 12/24/23  Yes Newlin, Enobong, MD  spironolactone  (ALDACTONE ) 25 MG tablet PLACE 0.5 TABLETS (12.5 MG TOTAL) INTO FEEDING TUBE DAILY. 10/01/23  Yes Newlin, Enobong, MD  acetaminophen  (TYLENOL ) 500 MG tablet Place 500 mg into feeding tube every 6 (six) hours as needed for mild pain.    [provider]  guaifenesin  (HUMIBID E) 400 MG TABS tablet 400 mg 3 (three) times daily. Place 400mg  into feeding tube three times a day Patient not taking: Reported on 02/15/2024 01/19/23   [provider]  ipratropium-albuterol  (DUONEB) 0.5-2.5 (3) MG/3ML SOLN Inhale 3 mLs into the lungs every 4 (four) hours as needed. SMARTSIG:1 Unspecified Via Inhaler Every 4 Hours PRN Patient not taking: Reported on 02/15/2024 05/09/23   Wilfredo Hanly, MD  Iron-Vitamins (GERITOL) LIQD Take 10 mLs by mouth 2 (two) times daily. Patient not taking: Reported on 02/15/2024 01/22/24   Newlin, Enobong, MD  metoprolol  tartrate (LOPRESSOR ) 25 MG tablet PLACE 0.5 TABLETS (12.5 MG TOTAL) INTO FEEDING TUBE 2 (TWO) TIMES DAILY. Patient not taking: Reported on 02/15/2024 02/12/24   Newlin, Enobong, MD  Misc. Devices MISC Eval & titrate pt for poc for portable oxygen  container. If patient qualifies, dispense at 1-6 pulse dose. Please fax to [phone].  Diagnosis-aspiration pneumonia 06/26/23   Joaquin Mulberry, MD  Misc. Devices MISC Shower chair.  Diagnosis myotonic dystrophy 09/14/23   Joaquin Mulberry, MD  Water  For Irrigation, Sterile (FREE WATER ) SOLN free water  via tube before and after each feeding 05/25/23   Audria Leather, MD    Current Facility-Administered Medications  Medication Dose Route Frequency Provider Last Rate Last Admin   0.9 %  sodium chloride  infusion   Intravenous Continuous Karrisa Didio V, MD       0.9 %  sodium  chloride infusion    Continuous PRN Gemayel Mascio V, MD 10 mL/hr at 02/19/24 0743 500 mL at 02/19/24 0743    Allergies as of 12/06/2023 - Review Complete 12/04/2023  Allergen Reaction Noted   Penicillin g Nausea And Vomiting, Rash, and Other (See Comments) 07/27/2015   Penicillins Nausea And Vomiting, Rash, and Other (See Comments) 05/16/2014    Family History  Problem Relation Age of Onset   Cancer Father 55       Deceased   Heart disease Father    COPD Mother    Diabetes Maternal Uncle    Heart disease Maternal Uncle    Heart disease Paternal Grandmother    Diabetes Paternal Grandmother    Hypertension Paternal Grandmother    Neuromuscular disorder Maternal Aunt        Myotonic dystrophy   Neuromuscular disorder Cousin        Myotonic dystrophy   Neuromuscular disorder Sister        Myotonic dystrophy   Colon cancer Neg Hx    Colon polyps Neg Hx    Esophageal cancer Neg Hx    Rectal cancer Neg Hx    Stomach cancer Neg Hx     Social History   Socioeconomic History   Marital status: Single    Spouse name: Not on file   Number of children: 0   Years of education: 1.5 Collge   Highest education level: Some college, no degree  Occupational History   Occupation: Unemployed    Employer: OTHER  Tobacco Use   Smoking status: Former    Current packs/day: 0.00    Types: Cigarettes    Start date: 10/04/1988    Quit date: 10/04/2016    Years since quitting: 7.3   Smokeless tobacco: Never  Vaping Use   Vaping status: Never Used  Substance and Sexual Activity   Alcohol use: Not Currently    Alcohol/week: 0.0 standard drinks of alcohol    Comment:  few times a year   Drug use: Not Currently    Types: Marijuana    Comment: occ    Sexual activity: Not Currently    Birth control/protection: None, Condom  Other Topics Concern   Not on file  Social History Narrative   Patient lives at home with her aunt.   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  She  moved to [place] in August 2015.   She has been on disability since 2010.   Education: 1 1/2 years of college.   Caffeine - coke 2 cans/day   Exercise - no   Social Drivers of Corporate investment banker Strain: Low Risk  (01/15/2024)   Overall Financial Resource Strain (CARDIA)    Difficulty of Paying Living Expenses: Not hard at all  Food Insecurity: No Food Insecurity (01/15/2024)   Hunger Vital Sign    Worried About Running Out of Food in the Last Year: Never true    Ran  Out of Food in the Last Year: Never true  Transportation Needs: No Transportation Needs (01/15/2024)   PRAPARE - Administrator, Civil Service (Medical): No    Lack of Transportation (Non-Medical): No  Physical Activity: Patient Declined (01/15/2024)   Exercise Vital Sign    Days of Exercise per Week: Patient declined    Minutes of Exercise per Session: Patient declined  Stress: No Stress Concern Present (01/15/2024)   [employer] of Occupational Health - Occupational Stress Questionnaire    Feeling of Stress : Only a little  Social Connections: Unknown (01/15/2024)   Social Connection and Isolation Panel    Frequency of Communication with Friends and Family: More than three times a week    Frequency of Social Gatherings with Friends and Family: Twice a week    Attends Religious Services: Patient declined    Database administrator or Organizations: No    Attends Banker Meetings: Never    Marital Status: Never married  Intimate Partner Violence: Not At Risk (01/15/2024)   Humiliation, Afraid, Rape, and Kick questionnaire    Fear of Current or Ex-Partner: No    Emotionally Abused: No    Physically Abused: No    Sexually Abused: No    Review of Systems:  All other review of systems negative except as mentioned in the HPI.  Physical Exam: Vital signs in last 24 hours: Temp:  [98.1 F (36.7 C)] 98.1 F (36.7 C) (06/17 0727) Pulse Rate:  [72] 72 (06/17 0727) Resp:  [10] 10  (06/17 0727) BP: (104)/(71) 104/71 (06/17 0727) SpO2:  [100 %] 100 % (06/17 0727) Weight:  [44 kg] 44 kg (06/17 0727)   General:   Alert, NAD Lungs:  Clear .   Heart:  Regular rate and rhythm Abdomen:  Soft, nontender and nondistended. Neuro/Psych:  Alert and cooperative. Normal mood and affect. A and O x 3   K. Veena Marquan Vokes , MD [phone]

## 2024-02-19 NOTE — Anesthesia Preprocedure Evaluation (Addendum)
 Anesthesia Evaluation  Patient identified by MRN, date of birth, ID band Patient awake    Reviewed: Allergy & Precautions, NPO status , Patient's Chart, lab work & pertinent test results  Airway Mallampati: II  TM Distance: >3 FB Neck ROM: Full    Dental  (+) Edentulous Upper   Pulmonary shortness of breath and Long-Term Oxygen  Therapy, pneumonia, Patient abstained from smoking., former smoker H/o trach   Pulmonary exam normal        Cardiovascular +CHF  Normal cardiovascular exam+ pacemaker      Neuro/Psych  PSYCHIATRIC DISORDERS Anxiety Depression     Neuromuscular disease    GI/Hepatic negative GI ROS, Neg liver ROS,,,  Endo/Other  Hypothyroidism    Renal/GU negative Renal ROS     Musculoskeletal negative musculoskeletal ROS (+)    Abdominal   Peds  Hematology  (+) Blood dyscrasia, Sickle cell trait   Anesthesia Other Findings Pain of upper abdomen Abdominal pain-G-Tube in-situ    Reproductive/Obstetrics                             Anesthesia Physical Anesthesia Plan  ASA: 4  Anesthesia Plan: MAC   Post-op Pain Management:    Induction:   PONV Risk Score and Plan: 2 and Propofol  infusion and Treatment may vary due to age or medical condition  Airway Management Planned: Nasal Cannula  Additional Equipment:   Intra-op Plan:   Post-operative Plan:   Informed Consent: I have reviewed the patients History and Physical, chart, labs and discussed the procedure including the risks, benefits and alternatives for the proposed anesthesia with the patient or authorized representative who has indicated his/her understanding and acceptance.       Plan Discussed with: CRNA  Anesthesia Plan Comments:        Anesthesia Quick Evaluation

## 2024-02-19 NOTE — Transfer of Care (Signed)
 Immediate Anesthesia Transfer of Care Note  Patient: Cynthia Underwood  Procedure(s) Performed: EGD (ESOPHAGOGASTRODUODENOSCOPY)  Patient Location: Endoscopy Unit  Anesthesia Type:MAC  Level of Consciousness: drowsy and patient cooperative  Airway & Oxygen  Therapy: Patient Spontanous Breathing and Patient connected to face mask oxygen   Post-op Assessment: Report given to RN and Post -op Vital signs reviewed and stable  Post vital signs: Reviewed and stable  Last Vitals:  Vitals Value Taken Time  BP    Temp    Pulse    Resp    SpO2      Last Pain:  Vitals:   02/19/24 0727  PainSc: 5          Complications: No notable events documented.

## 2024-02-20 ENCOUNTER — Ambulatory Visit: Payer: Self-pay | Admitting: Gastroenterology

## 2024-02-20 ENCOUNTER — Other Ambulatory Visit: Payer: Self-pay

## 2024-02-20 ENCOUNTER — Ambulatory Visit: Payer: 59 | Admitting: Family Medicine

## 2024-02-20 LAB — SURGICAL PATHOLOGY

## 2024-02-20 NOTE — Anesthesia Postprocedure Evaluation (Signed)
 Anesthesia Post Note  Patient: Cynthia Underwood  Procedure(s) Performed: EGD (ESOPHAGOGASTRODUODENOSCOPY)     Patient location during evaluation: Endoscopy Anesthesia Type: MAC Level of consciousness: awake Pain management: pain level controlled Vital Signs Assessment: post-procedure vital signs reviewed and stable Respiratory status: spontaneous breathing, nonlabored ventilation and respiratory function stable Cardiovascular status: blood pressure returned to baseline and stable Postop Assessment: no apparent nausea or vomiting Anesthetic complications: no   No notable events documented.  Last Vitals:  Vitals:   02/19/24 0900 02/19/24 0910  BP: 104/74 111/67  Pulse: 70 70  Resp: 10 20  Temp:    SpO2: 100% 100%    Last Pain:  Vitals:   02/19/24 0910  TempSrc:   PainSc: 0-No pain                 Kardell Virgil P Shandricka Monroy

## 2024-02-20 NOTE — Patient Instructions (Signed)
 Visit Information  Thank you for taking time to visit with me today. Please don't hesitate to contact me if I can be of assistance to you before our next scheduled appointment.  Your next care management appointment is by telephone on Wednesday, July 2nd at 1:00pm with Augustin Leber    Please call the care guide team at [phone] if you need to cancel, schedule, or reschedule an appointment.   A reminder to ALL patients/family/friends, please call the [place]  National Suicide Prevention Lifeline: (570)792-2567 or TTY: 671-032-2191 TTY (901) 130-3626) to talk to a trained counselor if you are experiencing a Mental Health or Behavioral Health Crisis or need someone to talk to.  Jurline Olmsted BSN, CCM [place]  VBCI Population Health RN Care Manager Direct Dial: [phone]  Fax: [phone]

## 2024-02-20 NOTE — Patient Outreach (Addendum)
 Complex Care Management   Visit Note  02/20/2024  Name:  Cynthia Underwood MRN: [id number] DOB: 1970-09-14  Situation: Referral received for Complex Care Management related to Impaired Swallowing, PEG  I obtained verbal consent from Patient.  Visit completed with Cynthia Underwood  on the phone.  Reports EGD performed 02/19/24, she has no concerns today, requests refill on spironolactone .   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   Sickle cell trait (HCC)    Watery eyes    Left   Weakness of both legs     Assessment: Patient Reported Symptoms:  Cognitive Cognitive Status: Alert and oriented to person, place, and time, Normal speech and language skills, Insightful and able to interpret abstract concepts      Neurological Neurological Review of Symptoms: No symptoms reported    HEENT HEENT Symptoms Reported: Not assessed      Cardiovascular Cardiovascular Symptoms Reported: No symptoms reported    Respiratory Respiratory Symptoms Reported: No symptoms reported    Endocrine Patient reports the following symptoms related to hypoglycemia or hyperglycemia : Not assessed    Gastrointestinal Gastrointestinal Symptoms Reported: No symptoms reported      Genitourinary Genitourinary Symptoms Reported: No symptoms reported    Integumentary      Musculoskeletal Musculoskelatal Symptoms Reviewed: Not assessed        Psychosocial Psychosocial Symptoms Reported: No symptoms reported            01/22/2024    2:46 PM  Depression screen PHQ 2/9  Decreased Interest 0  Down, Depressed, Hopeless 0  PHQ - 2 Score 0  Altered sleeping 2  Tired, decreased energy 2  Change in appetite 0  Feeling bad or failure about yourself  0  Trouble  concentrating 0  Moving slowly or fidgety/restless 0  Suicidal thoughts 0  PHQ-9 Score 4    There were no vitals filed for this visit.  Medications Reviewed Today     Reviewed by Isadore Marble, RN (Registered Nurse) on 02/20/24 at 1412  Med List Status: <None>   Medication Order Taking? Sig Documenting Provider Last Dose Status Informant  acetaminophen  (TYLENOL ) 500 MG tablet [id number]  Place 500 mg into feeding tube every 6 (six) hours as needed for mild pain. [provider]  Active Family Member  esomeprazole (NEXIUM) 40 MG packet [id number]  Take 40 mg by mouth daily before breakfast. Nandigam, Kavitha V, MD  Active   guaifenesin  (HUMIBID E) 400 MG TABS tablet [id number]  400 mg 3 (three) times daily. Place 400mg  into feeding tube three times a day  Patient not taking: Reported on 02/15/2024   [provider]  Active Family Member  ipratropium-albuterol  (DUONEB) 0.5-2.5 (3) MG/3ML SOLN [id number]  Inhale 3 mLs into the lungs every 4 (four) hours as needed. SMARTSIG:1 Unspecified Via Inhaler Every 4 Hours PRN  Patient not taking: Reported on 02/15/2024   Wilfredo Hanly, MD  Active Family Member  Iron-Vitamins [place]) [place] [id number]  Take 10 mLs by mouth 2 (two) times daily.  Patient not taking: Reported on 02/15/2024   Newlin, Enobong, MD  Active            Med Note Gomez Lathe, Bing Duffey A  Tue Feb 05, 2024  3:13 PM) She will be picking up from pharmacy when it is back in stock.   levothyroxine  (SYNTHROID ) 100 MCG tablet [id number]  Take 1 tablet (100 mcg total) by mouth daily before breakfast. Must have office visit for refills Newlin, Enobong, MD  Active   metoprolol  tartrate (LOPRESSOR ) 25 MG tablet [id number]  PLACE 0.5 TABLETS (12.5 MG TOTAL) INTO FEEDING TUBE 2 (TWO) TIMES DAILY.  Patient not taking: Reported on 02/15/2024   Newlin, Enobong, MD  Active   Misc. Devices MISC [id number]  Eval & titrate pt for poc for portable oxygen  container. If patient qualifies,  dispense at 1-6 pulse dose. Please fax to [phone]. Diagnosis-aspiration pneumonia Joaquin Mulberry, MD  Active   Misc. Devices MISC [id number]  Shower chair.  Diagnosis myotonic dystrophy Joaquin Mulberry, MD  Active   Nutritional Supplements (FEEDING SUPPLEMENT, JEVITY 1.5 CAL/FIBER,) LIQD [id number] Yes Place 237 mLs into feeding tube 4 (four) times daily. FWF before and after each feeding Newlin, Enobong, MD  Active   spironolactone  (ALDACTONE ) 25 MG tablet [id number] Yes PLACE 0.5 TABLETS (12.5 MG TOTAL) INTO FEEDING TUBE DAILY. Newlin, Enobong, MD  Active   Water  For Irrigation, Sterile (FREE WATER ) SOLN [id number]  100ml free water  via tube before and after each feeding Audria Leather, MD  Active             Recommendation:   Specialty provider follow-up : reminded of upcoming appt with Neurology on 03/03/24 Continue Current Plan of Care -Patient is awaiting results from pathology report from EGD 02/19/24   Follow Up Plan:   Telephone follow-up two weeks.  Jurline Olmsted BSN, CCM [place]  VBCI Population Health RN Care Manager Direct Dial: [phone]  Fax: [phone]

## 2024-02-22 ENCOUNTER — Encounter (HOSPITAL_COMMUNITY): Payer: Self-pay | Admitting: Gastroenterology

## 2024-03-03 ENCOUNTER — Encounter: Payer: Self-pay | Admitting: Neurology

## 2024-03-03 ENCOUNTER — Ambulatory Visit (INDEPENDENT_AMBULATORY_CARE_PROVIDER_SITE_OTHER): Payer: 59 | Admitting: Neurology

## 2024-03-03 VITALS — BP 106/75 | HR 87 | Ht 63.0 in | Wt 100.0 lb

## 2024-03-03 DIAGNOSIS — M6281 Muscle weakness (generalized): Secondary | ICD-10-CM

## 2024-03-03 DIAGNOSIS — R5383 Other fatigue: Secondary | ICD-10-CM | POA: Diagnosis not present

## 2024-03-03 DIAGNOSIS — G7111 Myotonic muscular dystrophy: Secondary | ICD-10-CM

## 2024-03-03 MED ORDER — MODAFINIL 100 MG PO TABS
100.0000 mg | ORAL_TABLET | Freq: Every day | ORAL | 3 refills | Status: AC
Start: 2024-03-03 — End: ?

## 2024-03-03 NOTE — Progress Notes (Signed)
 Follow-up Visit   Date: 03/03/24    Cynthia Underwood MRN: [id number] DOB: [birth year]   Interim History: Cynthia Underwood is a 53 y.o. 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  The patient was accompanied to the clinic by self.  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  Disease complicated by cataracts, cardiac arrhthymias s/p PPM, dysphagia s/p PEG (2024), and dyspnea on supplemental oxygen  (2024).  Today, she reports ongoing fatigue and generalized weakness.  She was previously on Cymbalta  for fatigue and myalgia, however now with a PEG, capsule cannot be used.  Start modafinil 100mg  for fatigue Start physical therapy for generalized weakness  Return to clinic in 6 months    ----------------------------------------------------------------------- History of present illness: Starting around the age of 37, she started having lock jaw and stiffness of the hands.  Over the years, she developed worsening stiffness of her fingers and hands.  Because her maternal aunt, who is also a patient of mine, had known myotonic dystrophy, she ultimately was genetically tested at the age of 26 which confirmed the diagnosis of myotonic dystrophy type 1. She has a strong family history of DM1 including maternal aunt, cousins x 2, and younger sister.  She does not have any children and has no future plans for pregnancy.  Symptoms were relatively stable until her mid-30s and then started developing fatigue, weakness, daytime sleepiness, and shortness of breath with exertion.  She walks independently but was told previously told to use leg braces.  Over the past few years, she noticed intermittent difficulty swallowing  liquids. She underwent barium swallow which showed signs of aspiration and she was recommended to use a straw and use chin tuck position.    She was seeing several neurologists over the years at [facility] in [place] .  She is not working and has been on disability since 2010.  She established care with me in February 2016.   In fall of 2016, she had to move her bedroom to the [place] because of difficulty climbing stairs.  Since completing home PT/OT, her muscle strength has improved and sometimes, she is able to walk without AFO.  By 2017, she was able to climb stairs and went back to sleeping upstairs. She was started in Cymbalta  30mg  for mood, fatigue and myalgias which significantly helped, but self-discontinued this in 2018 due to feeling it was ineffective.  In 2022, she was tried on gabapentin , but this did not help.   She followed by cardiology for abnormal EKG with RBBB and LAFB as well as myocardial thinning.  In 2018, she had dual chamber PPM implanted by Dr. Fernande.  She is also undergoing pulmonology work-up for dyspnea, which is suggestive of restrictive changes, consistent with muscular weakness due to DM1.    UPDATE 05/19/2022:  She is here for follow-up visit.  She continues to have weakness in the arms and legs, difficulty with grip/reaching for objects and climbing stairs.  She does not regularly wear her AFOs.  Fortunately, she denies any falls.  She is has noticed improved swallow after her thyroid  was removed earlier this year.  Her PCP has increased mirtazapine  to 30mg /d, but this has not improved her sleep.  Prior sleep study did not show OSA.   UPDATE 09/04/2023:  She is here for follow-up visit.  Unfortunately, her generalized health had declined over the past year and she has been hospitalized for pneumonia several times.  Because of progressive dyspnea, she is on supplemental oxygen  3L.  She underwent PEG placement in April, but continues to have pain at the site of  PEG.  Overall, energy is very low.  She has generalized weakness and gets tired very quickly.   UPDATE 03/03/2024:  She is here for follow-up visit.  She had PEG changed which has improved her abdominal pain.  There has been no significant change in her overall motor strength.  She does report feeling very fatigued all the time.  She is able to swallow soft foods and small tablets by mouth.   Medications:  Current Outpatient Medications on File Prior to Visit  Medication Sig Dispense Refill   acetaminophen  (TYLENOL ) 500 MG tablet Place 500 mg into feeding tube every 6 (six) hours as needed for mild pain.     esomeprazole  (NEXIUM ) 40 MG packet Take 40 mg by mouth daily before breakfast. 90 each 3   levothyroxine  (SYNTHROID ) 100 MCG tablet Take 1 tablet (100 mcg total) by mouth daily before breakfast. Must have office visit for refills 30 tablet 0   Misc. Devices MISC Eval & titrate pt for poc for portable oxygen  container. If patient qualifies, dispense at 1-6 pulse dose. Please fax to [phone]. Diagnosis-aspiration pneumonia 1 each 0   Misc. Devices MISC Shower chair.  Diagnosis myotonic dystrophy 1 each 0   Nutritional Supplements (FEEDING SUPPLEMENT, JEVITY 1.5 CAL/FIBER,) LIQD Place 237 mLs into feeding tube 4 (four) times daily. FWF before and after each feeding 1000 mL 12   spironolactone  (ALDACTONE ) 25 MG tablet PLACE 0.5 TABLETS (12.5 MG TOTAL) INTO FEEDING TUBE DAILY. 45 tablet 1   Water  For Irrigation, Sterile (FREE WATER ) SOLN 100ml free water  via tube before and after each feeding     guaifenesin  (HUMIBID E) 400 MG TABS tablet 400 mg 3 (three) times daily. Place 400mg  into feeding tube three times a day (Patient not taking: Reported on 03/03/2024)     ipratropium-albuterol  (DUONEB) 0.5-2.5 (3) MG/3ML SOLN Inhale 3 mLs into the lungs every 4 (four) hours as needed. SMARTSIG:1 Unspecified Via Inhaler Every 4 Hours PRN (Patient not taking: Reported on 03/03/2024) 360 mL 3    Iron-Vitamins (GERITOL) LIQD Take 10 mLs by mouth 2 (two) times daily. (Patient not taking: Reported on 03/03/2024) 354 mL 1   metoprolol  tartrate (LOPRESSOR ) 25 MG tablet PLACE 0.5 TABLETS (12.5 MG TOTAL) INTO FEEDING TUBE 2 (TWO) TIMES DAILY. (Patient not taking: Reported on 03/03/2024) 90 tablet 1   No current facility-administered medications on file prior to visit.    Allergies:  Allergies  Allergen Reactions   Penicillin G Nausea And Vomiting, Rash and Other (See Comments)    Sore throat Has had cephalosporins in past   Penicillins Nausea And Vomiting, Rash and Other (See Comments)    Fever Has patient had a PCN reaction causing immediate rash, facial/tongue/throat swelling, SOB or lightheadedness with hypotension: Yes Has patient had a PCN reaction causing severe rash involving mucus membranes or skin necrosis: Unknown Has patient had a PCN reaction that required hospitalization: No Has patient had a PCN reaction occurring within the last 10 years: No If all of the above answers are NO, then may proceed with Cephalosporin use.     Vital Signs:  BP 106/75   Pulse 87   Ht 5' 3 (1.6  m)   Wt 100 lb (45.4 kg)   LMP 12/03/2016 (Approximate)   SpO2 97%   BMI 17.71 kg/m   Neurological Exam: MENTAL STATUS including orientation to time, place, person, recent and remote memory, attention span and concentration, language, and fund of knowledge is normal.  Speech is mildly dysarthric and severely hypophonic.    CRANIAL NERVES:  Normal conjugate, extra-ocular eye movements in all directions of gaze. Mild bilateral ptosis. Typical mytonic facies. Mild facial weakness.  MOTOR:  Generalized loss of muscle bulk. Mild grip myotonia bilaterally.  Right Upper Extremity:    Left Upper Extremity:    Deltoid  5-/5   Deltoid  5-/5   Biceps  5-/5   Biceps  5-/5   Triceps  5-/5   Triceps  5-/5   Wrist extensors  4/5   Wrist extensors  4/5   Wrist flexors  4/5   Wrist flexors  4/5   Finger  extensors  4/5   Finger extensors  4/5   Finger flexors  4/5   Finger flexors  4/5   Dorsal interossei  4-/5   Dorsal interossei  4-/5   Abductor pollicis  4/5   Abductor pollicis  4/5   Tone (Ashworth scale)  0  Tone (Ashworth scale)  0   Right Lower Extremity:    Left Lower Extremity:    Hip flexors  4/5   Hip flexors  4/5   Knee flexors  4/5   Knee flexors  4/5   Knee extensors  5-/5   Knee extensors  5-/5   Dorsiflexors  4/5   Dorsiflexors  4/5   Plantarflexors  5-/5   Plantarflexors  5-/5   Tone (Ashworth scale)  0  Tone (Ashworth scale)  0   COORDINATION/GAIT:   Gait is stable, mild dragging of the feet bilaterally, assisted with rollator.    Data: Lab Results  Component Value Date   HGBA1C 5.6 11/15/2022    Thank you for allowing me to participate in patient's care.  If I can answer any additional questions, I would be pleased to do so.    Sincerely,    Deisy Ozbun K. Tobie, DO

## 2024-03-05 ENCOUNTER — Other Ambulatory Visit: Payer: Self-pay

## 2024-03-05 DIAGNOSIS — I469 Cardiac arrest, cause unspecified: Secondary | ICD-10-CM

## 2024-03-05 DIAGNOSIS — I5043 Acute on chronic combined systolic (congestive) and diastolic (congestive) heart failure: Secondary | ICD-10-CM

## 2024-03-05 DIAGNOSIS — Z789 Other specified health status: Secondary | ICD-10-CM

## 2024-03-05 DIAGNOSIS — Z931 Gastrostomy status: Secondary | ICD-10-CM

## 2024-03-06 NOTE — Patient Outreach (Signed)
 Complex Care Management   Visit Note  03/06/2024  Name:  Cynthia Underwood MRN: [id number] DOB: [birth year]  Situation: Referral received for Complex Care Management related to Heart Failure I obtained verbal consent from Patient.  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   Sickle cell trait (HCC)    Watery eyes    Left   Weakness of both legs     Assessment: Patient Reported Symptoms:  Cognitive Cognitive Status: Able to follow simple commands, Alert and oriented to person, place, and time, Insightful and able to interpret abstract concepts, Normal speech and language skills      Neurological Neurological Review of Symptoms: No symptoms reported Neurological Management Strategies: Weight management  HEENT HEENT Symptoms Reported: No symptoms reported      Cardiovascular Cardiovascular Symptoms Reported: No symptoms reported    Respiratory Respiratory Symptoms Reported: No symptoms reported    Endocrine Endocrine Symptoms Reported: No symptoms reported    Gastrointestinal Gastrointestinal Symptoms Reported: Constipation, Diarrhea Gastrointestinal Management Strategies: Nutrition support, Medication therapy Enteral Nutrition Time Frame: Bolus    Genitourinary Genitourinary Symptoms Reported: No symptoms reported    Integumentary Integumentary Symptoms Reported: No symptoms reported    Musculoskeletal Musculoskelatal Symptoms Reviewed: Joint pain Musculoskeletal Management Strategies: Medical device Musculoskeletal Comment: uses walker Falls in the past year?: No    Psychosocial       Quality of Family Relationships: supportive Do you feel physically threatened by others?: No       03/05/2024    1:38 PM  Depression screen PHQ 2/9  Decreased Interest 0  Down, Depressed, Hopeless 0  PHQ - 2 Score 0    There were no vitals filed for this visit.  Medications Reviewed Today     Reviewed by Weyman Corning, RN (Registered Nurse) on 03/05/24 at 1323  Med List Status: <None>   Medication Order Taking? Sig Documenting Provider Last Dose Status Informant  acetaminophen  (TYLENOL ) 500 MG tablet [id number] Yes Place 500 mg into feeding tube every 6 (six) hours as needed for mild pain. [provider]  Active Family Member  esomeprazole  (NEXIUM ) 40 MG packet [id number] Yes Take 40 mg by mouth daily before breakfast. Nandigam, Kavitha V, MD  Active   guaifenesin  (HUMIBID E) 400 MG TABS tablet [id number]  400 mg 3 (three) times daily. Place 400mg  into feeding tube three times a day  Patient not taking: Reported on 03/05/2024   [provider]  Active Family Member  ipratropium-albuterol  (DUONEB) 0.5-2.5 (3) MG/3ML SOLN [id number]  Inhale 3 mLs into the lungs every 4 (four) hours as needed. SMARTSIG:1 Unspecified Via Inhaler Every 4 Hours PRN  Patient not taking: Reported on 03/03/2024   Kara Dorn NOVAK, MD  Active Family Member  Iron-Vitamins [place]) [place] [id number]  Take 10 mLs by mouth 2 (two) times daily.  Patient not taking: Reported on 03/05/2024   Newlin, Enobong, MD  Active            Med Note CARLISLE, [place] A   Tue Feb 05, 2024  3:13 PM) She will be picking up from pharmacy when it is back in stock.   levothyroxine  (SYNTHROID ) 100  MCG tablet [id number] Yes Take 1 tablet (100 mcg total) by mouth daily before breakfast. Must have office visit for refills Newlin, Enobong, MD  Active   metoprolol  tartrate (LOPRESSOR ) 25 MG tablet [id number]  PLACE 0.5 TABLETS (12.5 MG TOTAL) INTO FEEDING TUBE 2 (TWO) TIMES DAILY.  Patient not taking: Reported on 03/05/2024   Newlin, Enobong, MD  Active   Misc. Devices MISC [id number]  Eval & titrate pt for poc for portable oxygen   container. If patient qualifies, dispense at 1-6 pulse dose. Please fax to [phone]. Diagnosis-aspiration pneumonia Delbert Clam, MD  Active   Misc. Devices MISC [id number]  Shower chair.  Diagnosis myotonic dystrophy Delbert Clam, MD  Active   modafinil (PROVIGIL) 100 MG tablet [id number] Yes Take 1 tablet (100 mg total) by mouth daily. Patel, Donika K, DO  Active   Nutritional Supplements (FEEDING SUPPLEMENT, JEVITY 1.5 CAL/FIBER,) LIQD [id number] Yes Place 237 mLs into feeding tube 4 (four) times daily. FWF before and after each feeding Newlin, Enobong, MD  Active   spironolactone  (ALDACTONE ) 25 MG tablet [id number] Yes PLACE 0.5 TABLETS (12.5 MG TOTAL) INTO FEEDING TUBE DAILY. Newlin, Enobong, MD  Active   Water  For Irrigation, Sterile (FREE WATER ) LARRA[place] [id number] Yes free water  via tube before and after each feeding Cheryle Page, MD  Active             Recommendation:   PCP Follow-up  Follow Up Plan:   Telephone follow up appointment date/time:  04/09/24  130 pm  Wilbert Diver RN, BSN, [facility] [place]  [facility], [facility]    Care Coordinator Phone: [phone]

## 2024-03-06 NOTE — Patient Instructions (Signed)
 Visit Information  Thank you for taking time to visit with me today. Please don't hesitate to contact me if I can be of assistance to you before our next scheduled appointment.  Your next care management appointment is by telephone on 8/6/130 pm at 130 pm   Please call the care guide team at [phone] if you need to cancel, schedule, or reschedule an appointment.   Please call 1-800-273-TALK (toll free, 24 hour hotline) go to [facility] [address], [place] [phone]) call 911 if you are experiencing a Mental Health or Behavioral Health Crisis or need someone to talk to.  Wilbert Diver RN, BSN, [facility] [place]  [facility], [facility]    Care Coordinator Phone: [phone]

## 2024-03-10 ENCOUNTER — Other Ambulatory Visit: Payer: Self-pay | Admitting: Family Medicine

## 2024-03-10 ENCOUNTER — Telehealth: Payer: Self-pay | Admitting: *Deleted

## 2024-03-10 ENCOUNTER — Ambulatory Visit: Attending: Neurology | Admitting: Physical Therapy

## 2024-03-10 DIAGNOSIS — R2681 Unsteadiness on feet: Secondary | ICD-10-CM | POA: Diagnosis not present

## 2024-03-10 DIAGNOSIS — M79604 Pain in right leg: Secondary | ICD-10-CM | POA: Insufficient documentation

## 2024-03-10 DIAGNOSIS — R5383 Other fatigue: Secondary | ICD-10-CM | POA: Insufficient documentation

## 2024-03-10 DIAGNOSIS — G7111 Myotonic muscular dystrophy: Secondary | ICD-10-CM | POA: Diagnosis present

## 2024-03-10 DIAGNOSIS — R2689 Other abnormalities of gait and mobility: Secondary | ICD-10-CM | POA: Insufficient documentation

## 2024-03-10 DIAGNOSIS — M6281 Muscle weakness (generalized): Secondary | ICD-10-CM | POA: Insufficient documentation

## 2024-03-10 DIAGNOSIS — M79605 Pain in left leg: Secondary | ICD-10-CM | POA: Insufficient documentation

## 2024-03-10 DIAGNOSIS — R29898 Other symptoms and signs involving the musculoskeletal system: Secondary | ICD-10-CM | POA: Diagnosis not present

## 2024-03-10 MED ORDER — MISC. DEVICES MISC: 0 refills | Status: AC

## 2024-03-10 NOTE — Therapy (Signed)
 OUTPATIENT PHYSICAL THERAPY NEURO EVALUATION   Patient Name: Cynthia Underwood MRN: [id number] DOB:July 29, 1971, 53 y.o., female Today's Date: 03/10/2024   PCP: Delbert Clam, MD REFERRING PROVIDER: Tobie Tonita POUR, DO  END OF SESSION:  PT End of Session - 03/10/24 1517     Visit Number 1    Number of Visits 9   with eval   Date for PT Re-Evaluation 04/21/24    Authorization Type UHC Dual    PT Start Time 1515    PT Stop Time 1553    PT Time Calculation (min) 38 min    Equipment Utilized During Treatment Gait belt    Activity Tolerance Patient tolerated treatment well    Behavior During Therapy WFL for tasks assessed/performed          Past Medical History:  Diagnosis Date   Bifascicular block 10/02/2014   Cataract    Dyspnea    Excess ear wax    Multinodular goiter    Pneumonia    Presence of permanent cardiac pacemaker    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   Sickle cell trait (HCC)    Watery eyes    Left   Weakness of both legs    Past Surgical History:  Procedure Laterality Date   BREAST BIOPSY Right    ESOPHAGOGASTRODUODENOSCOPY N/A 02/19/2024   Procedure: EGD (ESOPHAGOGASTRODUODENOSCOPY);  Surgeon: Nandigam, Kavitha V, MD;  Location: [facility];  Service: Gastroenterology;  Laterality: N/A;   EYE SURGERY Left 2017   related to blockage in my nose   EYE SURGERY Right 05/2019   INSERT / REPLACE / REMOVE PACEMAKER  02/15/2017   IR GASTROSTOMY TUBE MOD SED  01/01/2023   IR REPLACE G-TUBE SIMPLE WO FLUORO  12/13/2023   PACEMAKER IMPLANT N/A 02/15/2017   Procedure: Pacemaker Implant;  Surgeon: Fernande Elspeth BROCKS, MD;  Location: [facility] INVASIVE CV LAB;  Service: Cardiovascular;  Laterality: N/A;   RIGHT/LEFT HEART CATH AND CORONARY ANGIOGRAPHY N/A 11/16/2022   Procedure: RIGHT/LEFT HEART CATH AND CORONARY ANGIOGRAPHY;  Surgeon: Rolan Ezra RAMAN, MD;  Location: [facility] INVASIVE CV LAB;  Service: Cardiovascular;  Laterality: N/A;    THYROIDECTOMY N/A 04/21/2021   Procedure: TOTAL THYROIDECTOMY;  Surgeon: Rubin Calamity, MD;  Location: [facility] OR;  Service: General;  Laterality: N/A;   Patient Active Problem List   Diagnosis Date Noted   Pain of upper abdomen 02/19/2024   Duodenal nodule 02/19/2024   Aspiration pneumonia (HCC) 05/21/2023   Gastrointestinal tube in situ (HCC) 03/13/2023   Diarrhea 01/23/2023   SIRS (systemic inflammatory response syndrome) (HCC) 01/23/2023   Chronic respiratory failure with hypoxia (HCC) 01/23/2023   Dysphagia 01/23/2023   Chronic systolic CHF (congestive heart failure) (HCC) 01/23/2023   Acute on chronic respiratory failure with hypoxia (HCC) 12/07/2022   Tracheostomy status (HCC) 12/07/2022   Generalized anxiety disorder 12/06/2022   Acute systolic heart failure (HCC) 11/26/2022   Drug-induced torsades de pointes (HCC) 11/17/2022   Acute systolic CHF (congestive heart failure) (HCC) 11/16/2022   Acute hypoxemic respiratory failure (HCC) 11/15/2022   Acute on chronic combined systolic and diastolic CHF (congestive heart failure) (HCC) 11/15/2022   Protein-calorie malnutrition, severe 11/15/2022   Cardiac arrest (HCC) 11/14/2022   Healthcare-associated pneumonia 11/11/2022   Hypothyroidism 11/07/2021   Thyroid  nodule 04/21/2021   S/P total thyroidectomy 04/21/2021   Sinus node dysfunction (HCC) 03/10/2020   Weakness of both legs    Watery eyes    Sickle cell trait (HCC)  Presence of permanent cardiac pacemaker    Excess ear wax    Depression    Anemia    Cardiac pacemaker in situ 03/01/2017   Mobitz type 2 second degree heart block 02/15/2017   Symptomatic advanced heart block 02/12/2017   SOB (shortness of breath) 10/02/2014   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   Bifascicular block 10/02/2014   Infection due to trichomonas vaginalis 09/15/2014   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    ONSET DATE: 03/03/2024 (referral  date)  REFERRING DIAG: G71.11 (ICD-10-CM) - Myotonic dystrophy, type 1 (HCC) R53.83 (ICD-10-CM) - Other fatigue M62.81 (ICD-10-CM) - Muscle weakness (generalized)  THERAPY DIAG:  Muscle weakness (generalized)  Unsteadiness on feet  Other abnormalities of gait and mobility  Dystrophy, myotonica (HCC)  Other symptoms and signs involving the musculoskeletal system  Pain in right leg  Rationale for Evaluation and Treatment: Rehabilitation  SUBJECTIVE:                                                                                                                                                                                             SUBJECTIVE STATEMENT:  Pt familiar to this clinic, last seen Jan-Feb 2025 for her myotonic dystrophy. Pt reports that new onset of R leg pain brings her back to therapy. She reports that this pain has been going on for about a month, no specific injury. Pt continues to wear oxygen  most of the time at home, supposed to wear is while she is sleeping but doesn't due to the noise.  Pt accompanied by: self, pt takes transportation to her appointments (does not drive)  PERTINENT HISTORY: PMH: myotonic dystrophy type I complicated by cardiac arryhtmia s/p PPM (June 2018), dysphagia s/p PEG, and dyspnea on supplemental oxygen  via nasal canula and hyperlipidemia  History of current illness: Starting around the age of 28, she started having lock jaw and stiffness of the hands.  Over the years, she developed worsening stiffness of her fingers and hands.  she ultimately was genetically tested at the age of 60 which confirmed the diagnosis of myotonic dystrophy type 1. She has a strong family history of DM1 including maternal aunt, cousins x 2, and younger sister.  She does not have any children and has no future plans for pregnancy.   Symptoms were relatively stable until her mid-30s and then started developing fatigue, weakness, daytime sleepiness, and shortness of  breath with exertion.  She walks independently but was told previously told to use leg braces.  Over the past few years, she noticed intermittent difficulty swallowing liquids. She underwent barium swallow which  showed signs of aspiration and she was recommended to use a straw and use chin tuck position.     She was seeing several neurologists over the years at [facility] in [place] .  She is not working and has been on disability since 2010.  PAIN:  Are you having pain? Yes: NPRS scale: 6/10 Pain location: R hip/groin area Pain description: achy pain Aggravating factors: trying to lift leg up, sometimes bothers her when walking, sitting down for extended period of time Relieving factors: Tylenol  if she can't stand the pain  PRECAUTIONS: Fall, ICD/Pacemaker, and Other: PEG tube, supplemental O2  RED FLAGS: None   WEIGHT BEARING RESTRICTIONS: No  FALLS: Has patient fallen in last 6 months? No  LIVING ENVIRONMENT: Lives with: lives with their family Lives in: House/apartment Stairs: No (one level home) Has following equipment at home: Walker - 4 wheeled  PLOF: Independent with gait, Independent with transfers, and Requires assistive device for independence  PATIENT GOALS: stop the pain  OBJECTIVE:  Note: Objective measures were completed at Evaluation unless otherwise noted.  DIAGNOSTIC FINDINGS: None updated or relevant to this POC  COGNITION: Overall cognitive status: Within functional limits for tasks assessed   SENSATION: Decreased light touch in RLE  COORDINATION: Not assessed  EDEMA:  None  POSTURE: rounded shoulders, forward head, and posterior pelvic tilt  PALPATION: tenderness to palpation at R anterior hip region and down into R quads; pain with hip IR, with compression through joint, with hip flexion overpressure, mild pain with R hip distraction  LOWER EXTREMITY ROM:     Passive  Right Eval Left Eval  Hip flexion Pain with overpressure    Hip extension    Hip abduction    Hip adduction    Hip internal rotation Pain with PROM   Hip external rotation    Knee flexion    Knee extension    Ankle dorsiflexion    Ankle plantarflexion    Ankle inversion    Ankle eversion     (Blank rows = not tested)  LOWER EXTREMITY MMT:    MMT Right Eval Left Eval  Hip flexion Not tested due to pain 3-  Hip extension    Hip abduction    Hip adduction    Hip internal rotation    Hip external rotation    Knee flexion 3- 3-  Knee extension 3- 3-  Ankle dorsiflexion 3 3  Ankle plantarflexion    Ankle inversion    Ankle eversion    (Blank rows = not tested)  BED MOBILITY:  Mod I per pt report, sleeps in regular bed, no bedrails  TRANSFERS: Sit to stand: Modified independence  Assistive device utilized: Environmental consultant - 4 wheeled     Stand to sit: Modified independence  Assistive device utilized: Environmental consultant - 4 wheeled     Chair to chair: Modified independence  Assistive device utilized: Environmental consultant - 4 wheeled       RAMP:  Not tested  CURB:  Not tested  STAIRS: Not tested GAIT: Findings:  Gait pattern: decreased hip/knee flexion- Right, decreased hip/knee flexion- Left, and trunk flexed Distance walked: various clinic distances Assistive device utilized: Walker - 4 wheeled Level of assistance: Modified independence Comments: see above   FUNCTIONAL TESTS:    [facility] PT Assessment - 03/10/24 1529       Ambulation/Gait   Gait velocity 32.8 ft over 14.25 sec = 2.3 ft/sec      6 minute walk test results  Aerobic Endurance Distance Walked 1060   RPE 10/10     Standardized Balance Assessment   Standardized Balance Assessment Timed Up and Go Test;Five Times Sit to Stand    Five times sit to stand comments  12.68 sec   no UE     Timed Up and Go Test   TUG Normal TUG    Normal TUG (seconds) 14.12   with rollator                                                                                                                                        TREATMENT DATE:  PT Evaluation  Self-Care/Home Management Pt initially on supplemental O2 when received from lobby, she elects to remove O2 during PT Evaluation.  At rest on RA: SpO2 95%, HR 97 bpm Following on RA: SpO2 98%, HR 115 bpm   PATIENT EDUCATION: Education details: Eval findings, PT POC, results of OM and functional implications Person educated: Patient Education method: Explanation and Demonstration Education comprehension: verbalized understanding, returned demonstration, and needs further education  HOME EXERCISE PROGRAM: To be initiated and/or reviewed from previous POC [id number]   GOALS: Goals reviewed with patient? Yes  SHORT TERM GOALS=LONG TERM GOALS due to length of POC   LONG TERM GOALS: Target date: 04/10/2024   Pt will be independent with final HEP for improved strength, balance, transfers and gait as well as for independence with management of pain symptoms. Baseline:  Goal status: INITIAL  2.  Pt will improve gait velocity to at least 2.5 ft/sec for improved gait efficiency and performance at mod I level  Baseline: 2.3 ft/sec mod I with rollator (7/7) Goal status: INITIAL  3.  Pt will report </= 4/10 pain in her RLE at the most to demonstrate improved function and decreased pain level. Baseline: 6/10 (7/7) Goal status: INITIAL    ASSESSMENT:  CLINICAL IMPRESSION: Patient is a 53 year old female referred to Neuro OPPT for muscle weakness and fatigue related to her myotonic dystrophy, type 1.  Pt's PMH is significant for: myotonic dystrophy type I complicated by cardiac arryhtmia s/p PPM (June 2018), dysphagia s/p PEG, and dyspnea on supplemental oxygen  via nasal canula and hyperlipidemia. The following deficits were present during the exam: decreased BLE strength, decreased R hip ROM, increased pain in R hip region, and impaired endurance. Based on her LE weakness and fall history, pt is an increased risk for falls. Pt would  benefit from skilled PT to address these impairments and functional limitations to maximize functional mobility independence.   OBJECTIVE IMPAIRMENTS: cardiopulmonary status limiting activity, decreased activity tolerance, decreased balance, decreased endurance, decreased knowledge of condition, decreased ROM, decreased strength, improper body mechanics, postural dysfunction, and pain.   ACTIVITY LIMITATIONS: carrying, lifting, bending, sitting, standing, squatting, stairs, and transfers  PARTICIPATION LIMITATIONS: meal prep, cleaning, laundry, driving, shopping, and community activity  PERSONAL FACTORS: Time since onset of  injury/illness/exacerbation and 3+ comorbidities:   myotonic dystrophy type I complicated by cardiac arryhtmia s/p PPM (June 2018), dysphagia s/p PEG, and dyspnea on supplemental oxygen  via nasal canula and hyperlipidemiaare also affecting patient's functional outcome.   REHAB POTENTIAL: Fair chronic nature of her myotonic dystrophy type 1, has been seen for PT for multiple POC previously  CLINICAL DECISION MAKING: Stable/uncomplicated  EVALUATION COMPLEXITY: Low  PLAN:  PT FREQUENCY: 2x/week  PT DURATION: 4 weeks  PLANNED INTERVENTIONS: 97164- PT Re-evaluation, 97750- Physical Performance Testing, 97110-Therapeutic exercises, 97530- Therapeutic activity, [id number]- Neuromuscular re-education, 97535- Self Care, [id number]- Manual therapy, [id number]- Gait training, [phone]- Aquatic Therapy, [phone]- Electrical stimulation (manual), [phone] (1-2 muscles), 20561 (3+ muscles)- Dry Needling, Patient/Family education, Balance training, Stair training, Taping, Joint mobilization, Spinal mobilization, DME instructions, Cryotherapy, and Moist heat  PLAN FOR NEXT SESSION: review previous HEP/initiate new HEP to address impaired endurance and R hip/LE pain (R hip stretches and strengthening)   Waddell Southgate, PT Waddell Southgate, PT, DPT, CSRS  03/10/2024, 4:15 PM

## 2024-03-10 NOTE — Progress Notes (Unsigned)
 Complex Care Management Care Guide Note  03/10/2024 Name: Cynthia Underwood MRN: [id number] DOB: [birth year]  Cynthia Underwood is a 53 y.o. year old female who is a primary care patient of Newlin, Enobong, MD and is actively engaged with the care management team. I reached out to Cynthia VEAR Reef by phone today to assist with scheduling  with the Licensed Clinical Social Worker BSW.  Follow up plan: Unsuccessful telephone outreach attempt made. A HIPAA compliant phone message was left for the patient providing contact information and requesting a return call.  Harlene Satterfield  [facility] Health  [facility], [facility] Guide  Direct Dial: [phone]  Fax [phone]

## 2024-03-11 NOTE — Progress Notes (Unsigned)
 Complex Care Management Note Care Guide Note  03/11/2024 Name: Cynthia Underwood MRN: [id number] DOB: 12-13-1970   Complex Care Management Outreach Attempts: A second unsuccessful outreach was attempted today to offer the patient with information about available complex care management services.  Follow Up Plan:  Additional outreach attempts will be made to offer the patient complex care management information and services.   Encounter Outcome:  No Answer  Harlene Satterfield  [facility] Health  [facility], [facility] Guide  Direct Dial: [phone]  Fax [phone]

## 2024-03-12 ENCOUNTER — Other Ambulatory Visit (HOSPITAL_COMMUNITY): Payer: Self-pay

## 2024-03-12 ENCOUNTER — Telehealth: Payer: Self-pay

## 2024-03-12 NOTE — Progress Notes (Signed)
 Complex Care Management Care Guide Note  03/12/2024 Name: Cynthia Underwood MRN: [id number] DOB: 1971/01/13  Cynthia Underwood is a 53 y.o. year old female who is a primary care patient of Newlin, Enobong, MD and is actively engaged with the care management team. I reached out to Cynthia Underwood by phone today to assist with re-scheduling  with the RN Case Manager Licensed Clinical Social Worker.  Follow up plan: Unsuccessful telephone outreach attempt made. A HIPAA compliant phone message was left for the patient providing contact information and requesting a return call. No further outreach attempts will be made at this time. We have been unable to contact the patient to reschedule for complex care management services.   Harlene Satterfield  [facility] Health  Value-Based Care Institute, [facility] Guide  Direct Dial: [phone]  Fax [phone]

## 2024-03-12 NOTE — Telephone Encounter (Signed)
 Pharmacy Patient Advocate Encounter   Received notification from CoverMyMeds that prior authorization for Modafinil  100MG  tablets is required/requested.   Insurance verification completed.   The patient is insured through [facility] Medicare Part D.   Per test claim: PA required; PA submitted to above mentioned insurance via CoverMyMeds Key/confirmation #/EOC [facility] Status is pending

## 2024-03-13 NOTE — Telephone Encounter (Signed)
 Pharmacy Patient Advocate Encounter  Received notification from [employer] Medicare Part D that Prior Authorization for Modafinil  100MG  tablets has been DENIED.  Full denial letter will be uploaded to the media tab. See denial reason below.  Medicare allows us  to cover a drug only when it is a Part D drug. A Part D drug is one that is used for a medically accepted indication. A medically accepted indication means the use is approved by the Food and Drug Administration (FDA) OR the use is supported by one of the following accepted references:  (1) [facility] Formulary Service Drug Information (2) [employer] DRUGDEX Information System MODAFINIL  TAB 100MG  is not FDA approved for your medical conditions(s): Other fatigue. These condition(s) are not supported by one of the accepted references. Therefore your drug is denied because it is not being used for a medically accepted indication.  PA #/Case ID/Reference #: [id number]

## 2024-03-14 ENCOUNTER — Ambulatory Visit: Admitting: Physical Therapy

## 2024-03-14 DIAGNOSIS — R2689 Other abnormalities of gait and mobility: Secondary | ICD-10-CM

## 2024-03-14 DIAGNOSIS — R5383 Other fatigue: Secondary | ICD-10-CM | POA: Diagnosis not present

## 2024-03-14 DIAGNOSIS — M6281 Muscle weakness (generalized): Secondary | ICD-10-CM | POA: Diagnosis not present

## 2024-03-14 DIAGNOSIS — M79604 Pain in right leg: Secondary | ICD-10-CM | POA: Diagnosis not present

## 2024-03-14 DIAGNOSIS — M79605 Pain in left leg: Secondary | ICD-10-CM | POA: Diagnosis not present

## 2024-03-14 DIAGNOSIS — R29898 Other symptoms and signs involving the musculoskeletal system: Secondary | ICD-10-CM | POA: Diagnosis not present

## 2024-03-14 DIAGNOSIS — R2681 Unsteadiness on feet: Secondary | ICD-10-CM

## 2024-03-14 NOTE — Therapy (Signed)
 OUTPATIENT PHYSICAL THERAPY NEURO TREATMENT   Patient Name: Cynthia Underwood MRN: [id number] DOB:August 31, 1971, 53 y.o., female Today's Date: 03/14/2024   PCP: Delbert Clam, MD REFERRING PROVIDER: Tobie Tonita POUR, DO  END OF SESSION:  PT End of Session - 03/14/24 1405     Visit Number 2    Number of Visits 9   with eval   Date for PT Re-Evaluation 04/21/24    Authorization Type [employer] Dual    PT Start Time 1404    PT Stop Time 1447    PT Time Calculation (min) 43 min    Equipment Utilized During Treatment Oxygen     Activity Tolerance Patient tolerated treatment well    Behavior During Therapy WFL for tasks assessed/performed          Past Medical History:  Diagnosis Date   Bifascicular block 10/02/2014   Cataract    Dyspnea    Excess ear wax    Multinodular goiter    Pneumonia    Presence of permanent cardiac pacemaker    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   Sickle cell trait (HCC)    Watery eyes    Left   Weakness of both legs    Past Surgical History:  Procedure Laterality Date   BREAST BIOPSY Right    ESOPHAGOGASTRODUODENOSCOPY N/A 02/19/2024   Procedure: EGD (ESOPHAGOGASTRODUODENOSCOPY);  Surgeon: Nandigam, Kavitha V, MD;  Location: [facility];  Service: Gastroenterology;  Laterality: N/A;   EYE SURGERY Left 2017   related to blockage in my nose   EYE SURGERY Right 05/2019   INSERT / REPLACE / REMOVE PACEMAKER  02/15/2017   IR GASTROSTOMY TUBE MOD SED  01/01/2023   IR REPLACE G-TUBE SIMPLE WO FLUORO  12/13/2023   PACEMAKER IMPLANT N/A 02/15/2017   Procedure: Pacemaker Implant;  Surgeon: Fernande Elspeth BROCKS, MD;  Location: [facility] INVASIVE CV LAB;  Service: Cardiovascular;  Laterality: N/A;   RIGHT/LEFT HEART CATH AND CORONARY ANGIOGRAPHY N/A 11/16/2022   Procedure: RIGHT/LEFT HEART CATH AND CORONARY ANGIOGRAPHY;  Surgeon: Rolan Ezra RAMAN, MD;  Location: [facility] INVASIVE CV LAB;  Service: Cardiovascular;  Laterality: N/A;    THYROIDECTOMY N/A 04/21/2021   Procedure: TOTAL THYROIDECTOMY;  Surgeon: Rubin Calamity, MD;  Location: [facility] OR;  Service: General;  Laterality: N/A;   Patient Active Problem List   Diagnosis Date Noted   Pain of upper abdomen 02/19/2024   Duodenal nodule 02/19/2024   Aspiration pneumonia (HCC) 05/21/2023   Gastrointestinal tube in situ (HCC) 03/13/2023   Diarrhea 01/23/2023   SIRS (systemic inflammatory response syndrome) (HCC) 01/23/2023   Chronic respiratory failure with hypoxia (HCC) 01/23/2023   Dysphagia 01/23/2023   Chronic systolic CHF (congestive heart failure) (HCC) 01/23/2023   Acute on chronic respiratory failure with hypoxia (HCC) 12/07/2022   Tracheostomy status (HCC) 12/07/2022   Generalized anxiety disorder 12/06/2022   Acute systolic heart failure (HCC) 11/26/2022   Drug-induced torsades de pointes (HCC) 11/17/2022   Acute systolic CHF (congestive heart failure) (HCC) 11/16/2022   Acute hypoxemic respiratory failure (HCC) 11/15/2022   Acute on chronic combined systolic and diastolic CHF (congestive heart failure) (HCC) 11/15/2022   Protein-calorie malnutrition, severe 11/15/2022   Cardiac arrest (HCC) 11/14/2022   Healthcare-associated pneumonia 11/11/2022   Hypothyroidism 11/07/2021   Thyroid  nodule 04/21/2021   S/P total thyroidectomy 04/21/2021   Sinus node dysfunction (HCC) 03/10/2020   Weakness of both legs    Watery eyes    Sickle cell trait (HCC)  Presence of permanent cardiac pacemaker    Excess ear wax    Depression    Anemia    Cardiac pacemaker in situ 03/01/2017   Mobitz type 2 second degree heart block 02/15/2017   Symptomatic advanced heart block 02/12/2017   SOB (shortness of breath) 10/02/2014   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   Bifascicular block 10/02/2014   Infection due to trichomonas vaginalis 09/15/2014   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    ONSET DATE: 03/03/2024 (referral  date)  REFERRING DIAG: G71.11 (ICD-10-CM) - Myotonic dystrophy, type 1 (HCC) R53.83 (ICD-10-CM) - Other fatigue M62.81 (ICD-10-CM) - Muscle weakness (generalized)  THERAPY DIAG:  Muscle weakness (generalized)  Unsteadiness on feet  Other abnormalities of gait and mobility  Pain in right leg  Rationale for Evaluation and Treatment: Rehabilitation  SUBJECTIVE:                                                                                                                                                                                             SUBJECTIVE STATEMENT:  Pt presents w/rollator and portable oxygen  concentrator. Removed O2 upon entry to clinic. Denies changes since last session. 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  Over the years, she developed worsening stiffness of her fingers and hands.  she ultimately was genetically tested at the age of 2 which confirmed the diagnosis of myotonic dystrophy type 1. She has a strong family history of DM1 including maternal aunt, cousins x 2, and younger sister.  She does not have any children and has no future plans for pregnancy.   Symptoms were relatively stable until her mid-30s and then started developing fatigue, weakness, daytime sleepiness, and shortness of breath with exertion.  She walks independently but was told previously told to use leg braces.  Over the past few years, she noticed intermittent difficulty swallowing liquids. She underwent barium swallow which showed signs of aspiration and she was recommended to use a straw and use chin tuck  position.     She was seeing several neurologists over the years at [facility] in [place] .  She is not working and has been on disability since 2010.  PAIN:  Are you having pain? Yes: NPRS scale:  6/10 Pain location: R hip/groin area Pain description: achy pain Aggravating factors: trying to lift leg up, sometimes bothers her when walking, sitting down for extended period of time Relieving factors: Tylenol  if she can't stand the pain  PRECAUTIONS: Fall, ICD/Pacemaker, and Other: PEG tube, supplemental O2  RED FLAGS: None   WEIGHT BEARING RESTRICTIONS: No  FALLS: Has patient fallen in last 6 months? No  LIVING ENVIRONMENT: Lives with: lives with their family Lives in: House/apartment Stairs: No (one level home) Has following equipment at home: Walker - 4 wheeled  PLOF: Independent with gait, Independent with transfers, and Requires assistive device for independence  PATIENT GOALS: stop the pain  OBJECTIVE:  Note: Objective measures were completed at Evaluation unless otherwise noted.  DIAGNOSTIC FINDINGS: None updated or relevant to this POC  COGNITION: Overall cognitive status: Within functional limits for tasks assessed   SENSATION: Decreased light touch in RLE  COORDINATION: Not assessed  EDEMA:  None  POSTURE: rounded shoulders, forward head, and posterior pelvic tilt  PALPATION: tenderness to palpation at R anterior hip region and down into R quads; pain with hip IR, with compression through joint, with hip flexion overpressure, mild pain with R hip distraction  LOWER EXTREMITY ROM:     Passive  Right Eval Left Eval  Hip flexion Pain with overpressure   Hip extension    Hip abduction    Hip adduction    Hip internal rotation Pain with PROM   Hip external rotation    Knee flexion    Knee extension    Ankle dorsiflexion    Ankle plantarflexion    Ankle inversion    Ankle eversion     (Blank rows = not tested)  LOWER EXTREMITY MMT:     MMT Right Eval Left Eval  Hip flexion Not tested due to pain 3-  Hip extension    Hip abduction    Hip adduction    Hip internal rotation    Hip external rotation    Knee flexion 3- 3-  Knee extension 3- 3-  Ankle dorsiflexion 3 3  Ankle plantarflexion    Ankle inversion    Ankle eversion    (Blank rows = not tested)  BED MOBILITY:  Mod I per pt report, sleeps in regular bed, no bedrails  TRANSFERS: Sit to stand: Modified independence  Assistive device utilized: Environmental consultant - 4 wheeled     Stand to sit: Modified independence  Assistive device utilized: Environmental consultant - 4 wheeled     Chair to chair: Modified independence  Assistive device utilized: Environmental consultant - 4 wheeled       RAMP:  Not tested  CURB:  Not tested  STAIRS: Not tested GAIT: Findings:  Gait pattern: decreased hip/knee flexion- Right, decreased hip/knee flexion- Left, and trunk flexed Distance walked: various clinic distances Assistive device utilized: Walker - 4 wheeled Level of assistance: Modified independence Comments: see above   FUNCTIONAL TESTS:  TREATMENT DATE:   Ther Act  SciFit multi-peaks level 2.0 for 8 minutes using BUE/BLEs for neural priming for reciprocal movement, dynamic cardiovascular warmup and increased amplitude of stepping. RPE of 4/10 following activity and SpO2 at 95% on room air.  The following were performed for improved hip adductor mobility, pain modulation and BLE strength. Added to HEP (see bolded below):  Attempted modified thomas stretch on RLE, but too painful for pt to tolerate  Supine butterfly stretch, 10 reps of 10-15s holds.  Supine hip adductor ball squeezes w/5s isometric hold, x15 reps  Supine LTRs, x12 reps per side.  Sidelying clamshells, x20 reps per side. Increased difficulty performing on R side > L side due to weakness  Pt extremely  TTP along R hip adductor w/significant trigger point, so discussed potential to trial TPDN for pain relief. Pt reports she will think about this and let therapist know next week.   PATIENT EDUCATION: Education details: Initial HEP, brief education on TPDN  Person educated: Patient Education method: Explanation, Demonstration, Tactile cues, Verbal cues, and Handouts Education comprehension: verbalized understanding, returned demonstration, verbal cues required, tactile cues required, and needs further education  HOME EXERCISE PROGRAM: Access Code: [email or web address] URL: [email or web address] Date: 03/14/2024 Prepared by: Marlon Renn Stille  Exercises - Supine Butterfly Groin Stretch  - 1 x daily - 7 x weekly - 10 reps - 10-15 seconds hold - Supine Hip Adduction Isometric with Ball  - 1 x daily - 7 x weekly - 1-2 sets - 15-20 reps - 3-5 seconds hold - Supine Lower Trunk Rotation  - 1 x daily - 7 x weekly - 3 sets - 10 reps - Clamshell  - 1 x daily - 7 x weekly - 3 sets - 10 reps  GOALS: Goals reviewed with patient? Yes  SHORT TERM GOALS=LONG TERM GOALS due to length of POC   LONG TERM GOALS: Target date: 04/10/2024   Pt will be independent with final HEP for improved strength, balance, transfers and gait as well as for independence with management of pain symptoms. Baseline:  Goal status: INITIAL  2.  Pt will improve gait velocity to at least 2.5 ft/sec for improved gait efficiency and performance at mod I level  Baseline: 2.3 ft/sec mod I with rollator (7/7) Goal status: INITIAL  3.  Pt will report </= 4/10 pain in her RLE at the most to demonstrate improved function and decreased pain level. Baseline: 6/10 (7/7) Goal status: INITIAL    ASSESSMENT:  CLINICAL IMPRESSION: Emphasis of skilled PT session on establishing initial HEP for improved hip abductor/adductor strength and reduced pain. Pt extremely TTP along R hip adductor and could not tolerate modified thomas stretch.  Pt demonstrates functional weakness of R glute med as well as trigger point in R hip adductor, so will continue to work on functional hip strength for reduced pain and stress on adductors. Continue POC.    OBJECTIVE IMPAIRMENTS: cardiopulmonary status limiting activity, decreased activity tolerance, decreased balance, decreased endurance, decreased knowledge of condition, decreased ROM, decreased strength, improper body mechanics, postural dysfunction, and pain.   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   REHAB POTENTIAL: Fair chronic  nature of her myotonic dystrophy type 1, has been seen for PT for multiple POC previously  CLINICAL DECISION MAKING: Stable/uncomplicated  EVALUATION COMPLEXITY: Low  PLAN:  PT FREQUENCY: 2x/week  PT DURATION: 4 weeks  PLANNED INTERVENTIONS: [id number]- PT Re-evaluation, 97750- Physical Performance Testing, 97110-Therapeutic exercises, 97530- Therapeutic activity, [id number]- Neuromuscular re-education, 97535- Self Care, [id number]- Manual therapy, [id number]- Gait training, [phone]- Aquatic Therapy, [phone]- Electrical stimulation (manual), [phone] (1-2 muscles), [id number] (3+ muscles)- Dry Needling, Patient/Family education, Balance training, Stair training, Taping, Joint mobilization, Spinal mobilization, DME instructions, Cryotherapy, and Moist heat  PLAN FOR NEXT SESSION: Discuss TPDN to L adductors (she was interested on 7/11). review previous HEP/initiate new HEP to address impaired endurance and R hip/LE pain (R hip  stretches and strengthening), R hip adductor mobility, R glute med strength   Elea Holtzclaw E Talib Headley, PT, DPT  03/14/2024, 2:47 PM

## 2024-03-18 ENCOUNTER — Telehealth: Payer: Self-pay | Admitting: Neurology

## 2024-03-18 NOTE — Telephone Encounter (Signed)
 1. Which medications need refilled? (List name and dosage, if known) provigil   2. Which pharmacy/location is medication to be sent to? (include street and city if local pharmacy) [facility] [address]

## 2024-03-18 NOTE — Telephone Encounter (Signed)
 Please let pt know that her insurance has denied provigil .  Does she want to try Cymbalta  again?  She was taking this several years ago.  It comes as a powder that she can mix with water  and give through the PEG.

## 2024-03-20 MED ORDER — DRIZALMA SPRINKLE 20 MG PO CSDR: DELAYED_RELEASE_CAPSULE | ORAL | 5 refills | Status: AC

## 2024-03-20 NOTE — Telephone Encounter (Signed)
 Rx has been sent

## 2024-03-21 ENCOUNTER — Ambulatory Visit: Admitting: Physical Therapy

## 2024-03-21 DIAGNOSIS — R5383 Other fatigue: Secondary | ICD-10-CM | POA: Diagnosis not present

## 2024-03-21 DIAGNOSIS — M79605 Pain in left leg: Secondary | ICD-10-CM | POA: Diagnosis not present

## 2024-03-21 DIAGNOSIS — R2689 Other abnormalities of gait and mobility: Secondary | ICD-10-CM

## 2024-03-21 DIAGNOSIS — M6281 Muscle weakness (generalized): Secondary | ICD-10-CM

## 2024-03-21 DIAGNOSIS — R29898 Other symptoms and signs involving the musculoskeletal system: Secondary | ICD-10-CM | POA: Diagnosis not present

## 2024-03-21 DIAGNOSIS — R2681 Unsteadiness on feet: Secondary | ICD-10-CM | POA: Diagnosis not present

## 2024-03-21 DIAGNOSIS — M79604 Pain in right leg: Secondary | ICD-10-CM

## 2024-03-21 NOTE — Therapy (Signed)
 OUTPATIENT PHYSICAL THERAPY NEURO TREATMENT   Patient Name: Cynthia Underwood MRN: [id number] DOB:10/12/1970, 53 y.o., female Today's Date: 03/21/2024   PCP: Delbert Clam, MD REFERRING PROVIDER: Tobie Tonita POUR, DO  END OF SESSION:  PT End of Session - 03/21/24 1316     Visit Number 3    Number of Visits 9   with eval   Date for PT Re-Evaluation 04/21/24    Authorization Type UHC Dual    PT Start Time 1315    PT Stop Time 1400    PT Time Calculation (min) 45 min    Equipment Utilized During Treatment Oxygen     Activity Tolerance Patient limited by pain    Behavior During Therapy [facility] for tasks assessed/performed           Past Medical History:  Diagnosis Date   Bifascicular block 10/02/2014   Cataract    Dyspnea    Excess ear wax    Multinodular goiter    Pneumonia    Presence of permanent cardiac pacemaker    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   Sickle cell trait (HCC)    Watery eyes    Left   Weakness of both legs    Past Surgical History:  Procedure Laterality Date   BREAST BIOPSY Right    ESOPHAGOGASTRODUODENOSCOPY N/A 02/19/2024   Procedure: EGD (ESOPHAGOGASTRODUODENOSCOPY);  Surgeon: Nandigam, Kavitha V, MD;  Location: [facility];  Service: Gastroenterology;  Laterality: N/A;   EYE SURGERY Left 2017   related to blockage in my nose   EYE SURGERY Right 05/2019   INSERT / REPLACE / REMOVE PACEMAKER  02/15/2017   IR GASTROSTOMY TUBE MOD SED  01/01/2023   IR REPLACE G-TUBE SIMPLE WO FLUORO  12/13/2023   PACEMAKER IMPLANT N/A 02/15/2017   Procedure: Pacemaker Implant;  Surgeon: Fernande Elspeth BROCKS, MD;  Location: [facility] INVASIVE CV LAB;  Service: Cardiovascular;  Laterality: N/A;   RIGHT/LEFT HEART CATH AND CORONARY ANGIOGRAPHY N/A 11/16/2022   Procedure: RIGHT/LEFT HEART CATH AND CORONARY ANGIOGRAPHY;  Surgeon: Rolan Ezra RAMAN, MD;  Location: [facility];  Service: Cardiovascular;  Laterality: N/A;    THYROIDECTOMY N/A 04/21/2021   Procedure: TOTAL THYROIDECTOMY;  Surgeon: Rubin Calamity, MD;  Location: [facility] OR;  Service: General;  Laterality: N/A;   Patient Active Problem List   Diagnosis Date Noted   Pain of upper abdomen 02/19/2024   Duodenal nodule 02/19/2024   Aspiration pneumonia (HCC) 05/21/2023   Gastrointestinal tube in situ (HCC) 03/13/2023   Diarrhea 01/23/2023   SIRS (systemic inflammatory response syndrome) (HCC) 01/23/2023   Chronic respiratory failure with hypoxia (HCC) 01/23/2023   Dysphagia 01/23/2023   Chronic systolic CHF (congestive heart failure) (HCC) 01/23/2023   Acute on chronic respiratory failure with hypoxia (HCC) 12/07/2022   Tracheostomy status (HCC) 12/07/2022   Generalized anxiety disorder 12/06/2022   Acute systolic heart failure (HCC) 11/26/2022   Drug-induced torsades de pointes (HCC) 11/17/2022   Acute systolic CHF (congestive heart failure) (HCC) 11/16/2022   Acute hypoxemic respiratory failure (HCC) 11/15/2022   Acute on chronic combined systolic and diastolic CHF (congestive heart failure) (HCC) 11/15/2022   Protein-calorie malnutrition, severe 11/15/2022   Cardiac arrest (HCC) 11/14/2022   Healthcare-associated pneumonia 11/11/2022   Hypothyroidism 11/07/2021   Thyroid  nodule 04/21/2021   S/P total thyroidectomy 04/21/2021   Sinus node dysfunction (HCC) 03/10/2020   Weakness of both legs    Watery eyes    Sickle cell trait (HCC)  Presence of permanent cardiac pacemaker    Excess ear wax    Depression    Anemia    Cardiac pacemaker in situ 03/01/2017   Mobitz type 2 second degree heart block 02/15/2017   Symptomatic advanced heart block 02/12/2017   SOB (shortness of breath) 10/02/2014   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   Bifascicular block 10/02/2014   Infection due to trichomonas vaginalis 09/15/2014   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    ONSET DATE: 03/03/2024 (referral  date)  REFERRING DIAG: G71.11 (ICD-10-CM) - Myotonic dystrophy, type 1 (HCC) R53.83 (ICD-10-CM) - Other fatigue M62.81 (ICD-10-CM) - Muscle weakness (generalized)  THERAPY DIAG:  Muscle weakness (generalized)  Unsteadiness on feet  Other abnormalities of gait and mobility  Pain in right leg  Other symptoms and signs involving the musculoskeletal system  Rationale for Evaluation and Treatment: Rehabilitation  SUBJECTIVE:                                                                                                                                                                                             SUBJECTIVE STATEMENT:  Pt indicates that she is not doing well today. Pt wearing 3L O2 upon arrival, elects to remove it for PT session. Pt states that now her L leg is hurting, her hip, her knee and her foot (mostly the foot). Pt is having to take Tylenol  every 4-6 hours pain management. She reports that it started hurting about 3 days ago.  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  Over the years, she developed worsening stiffness of her fingers and hands.  she ultimately was genetically tested at the age of 62 which confirmed the diagnosis of myotonic dystrophy type 1. She has a strong family history of DM1 including maternal aunt, cousins x 2, and younger sister.  She does not have any children and has no future plans for pregnancy.   Symptoms were relatively stable until her mid-30s and then started developing fatigue, weakness, daytime sleepiness, and shortness of breath with exertion.  She walks independently but was told  previously told to use leg braces.  Over the past few years, she noticed intermittent difficulty swallowing liquids. She underwent barium swallow which showed signs of aspiration and she was recommended to use  a straw and use chin tuck position.     She was seeing several neurologists over the years at [facility] in [place] .  She is not working and has been on disability since 2010.  PAIN:  Are you having pain? Yes: NPRS scale: 6/10 Pain location: R hip/groin area Pain description: achy pain Aggravating factors: trying to lift leg up, sometimes bothers her when walking, sitting down for extended period of time Relieving factors: Tylenol  if she can't stand the pain  Are you having pain? Yes: NPRS scale: 10/10 Pain location: LLE but especially her L foot Pain description: it just hurts Aggravating factors: wakes her up because it is hurting Relieving factors: Tylenol  every 4-6 hours  PRECAUTIONS: Fall, ICD/Pacemaker, and Other: PEG tube, supplemental O2  RED FLAGS: None   WEIGHT BEARING RESTRICTIONS: No  FALLS: Has patient fallen in last 6 months? No  LIVING ENVIRONMENT: Lives with: lives with their family Lives in: House/apartment Stairs: No (one level home) Has following equipment at home: Walker - 4 wheeled  PLOF: Independent with gait, Independent with transfers, and Requires assistive device for independence  PATIENT GOALS: stop the pain  OBJECTIVE:  Note: Objective measures were completed at Evaluation unless otherwise noted.  DIAGNOSTIC FINDINGS: None updated or relevant to this POC  COGNITION: Overall cognitive status: Within functional limits for tasks assessed   SENSATION: Decreased light touch in RLE  COORDINATION: Not assessed  EDEMA:  None  POSTURE: rounded shoulders, forward head, and posterior pelvic tilt  PALPATION: tenderness to palpation at R anterior hip region and down into R quads; pain with hip IR, with compression through  joint, with hip flexion overpressure, mild pain with R hip distraction  LOWER EXTREMITY ROM:     Passive  Right Eval Left Eval  Hip flexion Pain with overpressure   Hip extension    Hip abduction    Hip adduction    Hip internal rotation Pain with PROM   Hip external rotation    Knee flexion    Knee extension    Ankle dorsiflexion    Ankle plantarflexion    Ankle inversion    Ankle eversion     (Blank rows = not tested)  LOWER EXTREMITY MMT:    MMT Right Eval Left Eval  Hip flexion Not tested due to pain 3-  Hip extension    Hip abduction    Hip adduction    Hip internal rotation    Hip external rotation    Knee flexion 3- 3-  Knee extension 3- 3-  Ankle dorsiflexion 3 3  Ankle plantarflexion    Ankle inversion    Ankle eversion    (Blank rows = not tested)  BED MOBILITY:  Mod I per pt report, sleeps in regular bed, no bedrails  TRANSFERS: Sit to stand: Modified independence  Assistive device utilized: Environmental consultant - 4 wheeled     Stand to sit: Modified independence  Assistive device utilized: Environmental consultant - 4 wheeled     Chair to chair: Modified independence  Assistive device utilized: Environmental consultant - 4 wheeled       RAMP:  Not tested  CURB:  Not tested  STAIRS: Not tested GAIT: Findings:  Gait pattern: decreased hip/knee flexion- Right, decreased hip/knee flexion- Left, and trunk flexed Distance walked: various clinic distances Assistive device utilized: Walker - 4 wheeled Level of assistance: Modified independence Comments: see above   FUNCTIONAL TESTS:  TREATMENT DATE:    Ther Act  Assessment of her LLE: No hip pain with PROM Some pain with HS stretch  Assessment of her L foot Pain with DF/PF PROM More pain with eversion/inversion PROM Sensitivity to light touch on skin  Encouraged her to reach out to PCP or go to  Urgent Care regarding her new onset of LLE pain this date. No visible bruising to area, no edema. Pt reports no falls or other injuries that would have led to onset of this pain.  Manual Therapy To address ongoing R hip adductor and HS pain: Cupping with progression from 2nd smallest cup to 3rd smallest cup. Pt feels nothing with 2nd smallest cup but is very sensitive to pressure with 3rd smallest cup. Pt also sensitive to STM and TPR to this area. Educated her on TPDN and provided handouts but will wait to perform this treatment as the area is so sensitive today.  TherEx To address ongoing muscle pain and tightness: Reviewed butterfly stretch, encouraged her to continue to work on this Added seated hamstring stretch with belt 3 x 30 sec each on RLE  PATIENT EDUCATION: Education details: continue HEP, added to HEP, seek out Urgent Care or contact PCP about new onset of LLE pain, TPDN Person educated: Patient Education method: Explanation, Demonstration, Tactile cues, Verbal cues, and Handouts Education comprehension: verbalized understanding, returned demonstration, verbal cues required, tactile cues required, and needs further education  HOME EXERCISE PROGRAM: Access Code: [email or web address] URL: [email or web address] Date: 03/14/2024 Prepared by: Marlon Plaster  Exercises - Supine Butterfly Groin Stretch  - 1 x daily - 7 x weekly - 10 reps - 10-15 seconds hold - Supine Hip Adduction Isometric with Ball  - 1 x daily - 7 x weekly - 1-2 sets - 15-20 reps - 3-5 seconds hold - Supine Lower Trunk Rotation  - 1 x daily - 7 x weekly - 3 sets - 10 reps - Clamshell  - 1 x daily - 7 x weekly - 3 sets - 10 reps - Seated Hamstring Stretch with Strap  - 1 x daily - 7 x weekly - 1 sets - 3-5 reps - 30 sec hold  GOALS: Goals reviewed with patient? Yes  SHORT TERM GOALS=LONG TERM GOALS due to length of POC   LONG TERM GOALS: Target date: 04/10/2024   Pt will be independent with final HEP  for improved strength, balance, transfers and gait as well as for independence with management of pain symptoms. Baseline:  Goal status: INITIAL  2.  Pt will improve gait velocity to at least 2.5 ft/sec for improved gait efficiency and performance at mod I level  Baseline: 2.3 ft/sec mod I with rollator (7/7) Goal status: INITIAL  3.  Pt will report </= 4/10 pain in her RLE at the most to demonstrate improved function and decreased pain level. Baseline: 6/10 (7/7) Goal status: INITIAL    ASSESSMENT:  CLINICAL IMPRESSION: Emphasis of skilled PT session on assessing her LLE due to new onset of pain in this limb this session as well as performing manual therapy to address tight R hip adductors and hamstrings. See above regarding LLE, encouraged patien to seek further medical care for her LLE pain. Pt very sensitive to pressure in her RLE so deferred DN this visit but performed manual therapy and cupping to tolerance. She can benefit from TPDN in future sessions when area has calmed down a bit. Continue POC.    OBJECTIVE IMPAIRMENTS: cardiopulmonary status limiting activity, decreased activity tolerance,  decreased balance, decreased endurance, decreased knowledge of condition, decreased ROM, decreased strength, improper body mechanics, postural dysfunction, and pain.   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   REHAB POTENTIAL: Fair chronic nature of her myotonic dystrophy type 1, has been seen for PT for multiple POC  previously  CLINICAL DECISION MAKING: Stable/uncomplicated  EVALUATION COMPLEXITY: Low  PLAN:  PT FREQUENCY: 2x/week  PT DURATION: 4 weeks  PLANNED INTERVENTIONS: 97164- PT Re-evaluation, 97750- Physical Performance Testing, 97110-Therapeutic exercises, 97530- Therapeutic activity, [id number]- Neuromuscular re-education, 97535- Self Care, [id number]- Manual therapy, [id number]- Gait training, [phone]- Aquatic Therapy, [phone]- Electrical stimulation (manual), [phone] (1-2 muscles), 20561 (3+ muscles)- Dry Needling, Patient/Family education, Balance training, Stair training, Taping, Joint mobilization, Spinal mobilization, DME instructions, Cryotherapy, and Moist heat  PLAN FOR NEXT SESSION: did she go to urgent care for LLE pain? Discuss TPDN to L adductors (she was interested on 7/11). review previous HEP/initiate new HEP to address impaired endurance and R hip/LE pain (R hip stretches and strengthening), R hip adductor mobility, R glute med strength   Waddell Southgate, PT Waddell Southgate, PT, DPT, CSRS   03/21/2024, 2:02 PM

## 2024-03-22 ENCOUNTER — Emergency Department (HOSPITAL_BASED_OUTPATIENT_CLINIC_OR_DEPARTMENT_OTHER)
Admission: EM | Admit: 2024-03-22 | Discharge: 2024-03-22 | Disposition: A | Discharge status: Home / Self Care | Attending: Emergency Medicine | Admitting: Emergency Medicine

## 2024-03-22 ENCOUNTER — Emergency Department (HOSPITAL_BASED_OUTPATIENT_CLINIC_OR_DEPARTMENT_OTHER)

## 2024-03-22 ENCOUNTER — Other Ambulatory Visit: Payer: Self-pay

## 2024-03-22 ENCOUNTER — Encounter (HOSPITAL_BASED_OUTPATIENT_CLINIC_OR_DEPARTMENT_OTHER): Payer: Self-pay

## 2024-03-22 DIAGNOSIS — Z87891 Personal history of nicotine dependence: Secondary | ICD-10-CM | POA: Diagnosis not present

## 2024-03-22 DIAGNOSIS — M79605 Pain in left leg: Secondary | ICD-10-CM | POA: Insufficient documentation

## 2024-03-22 DIAGNOSIS — J449 Chronic obstructive pulmonary disease, unspecified: Secondary | ICD-10-CM | POA: Insufficient documentation

## 2024-03-22 DIAGNOSIS — Z79890 Hormone replacement therapy (postmenopausal): Secondary | ICD-10-CM | POA: Diagnosis not present

## 2024-03-22 DIAGNOSIS — E039 Hypothyroidism, unspecified: Secondary | ICD-10-CM | POA: Insufficient documentation

## 2024-03-22 DIAGNOSIS — N281 Cyst of kidney, acquired: Secondary | ICD-10-CM | POA: Diagnosis not present

## 2024-03-22 LAB — CBC
HCT: 43.3 % (ref 36.0–46.0)
Hemoglobin: 14.1 g/dL (ref 12.0–15.0)
MCH: 31.4 pg (ref 26.0–34.0)
MCHC: 32.6 g/dL (ref 30.0–36.0)
MCV: 96.4 fL (ref 80.0–100.0)
Platelets: 174 K/uL (ref 150–400)
RBC: 4.49 MIL/uL (ref 3.87–5.11)
RDW: 14.2 % (ref 11.5–15.5)
WBC: 4.6 K/uL (ref 4.0–10.5)
nRBC: 0 % (ref 0.0–0.2)

## 2024-03-22 LAB — BASIC METABOLIC PANEL WITH GFR
Anion gap: 11 (ref 5–15)
BUN: 15 mg/dL (ref 6–20)
CO2: 28 mmol/L (ref 22–32)
Calcium: 10.5 mg/dL — ABNORMAL HIGH (ref 8.9–10.3)
Chloride: 104 mmol/L (ref 98–111)
Creatinine, Ser: 0.77 mg/dL (ref 0.44–1.00)
GFR, Estimated: >60 mL/min (ref >60)
Glucose, Bld: 81 mg/dL (ref 70–99)
Potassium: 3.9 mmol/L (ref 3.5–5.1)
Sodium: 143 mmol/L (ref 135–145)

## 2024-03-22 LAB — HCG, SERUM, QUALITATIVE: Preg, Serum: NEGATIVE

## 2024-03-22 MED ORDER — MORPHINE SULFATE (PF) 4 MG/ML IV SOLN
4.0000 mg | Freq: Once | INTRAVENOUS | Status: AC
  Administered 2024-03-22: 4 mg via INTRAVENOUS
  Filled 2024-03-22: qty 1

## 2024-03-22 MED ORDER — LIDOCAINE 5 % EX PTCH: 1.0000 | MEDICATED_PATCH | CUTANEOUS | 0 refills | Status: AC

## 2024-03-22 MED ORDER — IOHEXOL 350 MG/ML SOLN
100.0000 mL | Freq: Once | INTRAVENOUS | Status: AC | PRN
  Administered 2024-03-22: 100 mL via INTRAVENOUS

## 2024-03-22 MED ORDER — KETOROLAC TROMETHAMINE 15 MG/ML IJ SOLN
15.0000 mg | Freq: Once | INTRAMUSCULAR | Status: AC
  Administered 2024-03-22: 15 mg via INTRAMUSCULAR
  Filled 2024-03-22: qty 1

## 2024-03-22 NOTE — ED Triage Notes (Signed)
 Onset two days of left leg pain.  Pain from hip knee and foot.  States foot pain worse.  Constant pain   Present to triage in wheel chair.   States difficulty walking

## 2024-03-22 NOTE — ED Provider Notes (Signed)
 [place] EMERGENCY DEPARTMENT AT [facility] Provider Note   CSN: [id number] Arrival date & time: 03/22/24  1049     Patient presents with: Leg Pain   Cynthia Underwood is a 53 y.o. female.   53 year old female with history of myotonic dystrophy, hypoxic respiratory failure, cardiac arrest/V-fib March 2024, COPD, hypothyroidism, sickle cell trait presenting with left lower extremity pain.  Symptoms have been ongoing for 3 to 4 days, she denies known injury/inciting event, states it hurts from my hip to my foot , says this has not happened before.  She describes the pain as aching, in the bone.  She has been using Tylenol  with minimal relief, she has not been able to sleep at night secondary to the pain.  She is on 3 L of oxygen  at baseline and ambulates with a walker when she is out in public, ambulates unassisted at home.   Leg Pain      Prior to Admission medications   Medication Sig Start Date End Date Taking? Authorizing Provider  acetaminophen  (TYLENOL ) 500 MG tablet Place 500 mg into feeding tube every 6 (six) hours as needed for mild pain.    [provider]  DULoxetine  HCl (DRIZALMA SPRINKLE ) 20 MG CSDR Open capsule and mix pellets with 50mL water , then administer via PEG. 03/20/24   Patel, Donika K, DO  esomeprazole  (NEXIUM ) 40 MG packet Take 40 mg by mouth daily before breakfast. 02/19/24   Nandigam, Kavitha V, MD  guaifenesin  (HUMIBID E) 400 MG TABS tablet 400 mg 3 (three) times daily. Place 400mg  into feeding tube three times a day Patient not taking: Reported on 03/05/2024 01/19/23   [provider]  ipratropium-albuterol  (DUONEB) 0.5-2.5 (3) MG/3ML SOLN Inhale 3 mLs into the lungs every 4 (four) hours as needed. SMARTSIG:1 Unspecified Via Inhaler Every 4 Hours PRN Patient not taking: Reported on 03/03/2024 05/09/23   Kara Dorn NOVAK, MD  Iron-Vitamins (GERITOL) LIQD Take 10 mLs by mouth 2 (two) times daily. Patient not taking: Reported on  03/05/2024 01/22/24   Newlin, Enobong, MD  levothyroxine  (SYNTHROID ) 100 MCG tablet Take 1 tablet (100 mcg total) by mouth daily before breakfast. Must have office visit for refills 01/09/24   Newlin, Enobong, MD  metoprolol  tartrate (LOPRESSOR ) 25 MG tablet PLACE 0.5 TABLETS (12.5 MG TOTAL) INTO FEEDING TUBE 2 (TWO) TIMES DAILY. Patient not taking: Reported on 03/05/2024 02/12/24   Newlin, Enobong, MD  Misc. Devices MISC Eval & titrate pt for poc for portable oxygen  container. If patient qualifies, dispense at 1-6 pulse dose. Please fax to [phone]. Diagnosis-aspiration pneumonia 06/26/23   Delbert Clam, MD  Misc. Devices MISC Shower chair.  Diagnosis myotonic dystrophy 03/10/24   Delbert Clam, MD  modafinil  (PROVIGIL ) 100 MG tablet Take 1 tablet (100 mg total) by mouth daily. 03/03/24   Patel, Donika K, DO  Nutritional Supplements (FEEDING SUPPLEMENT, JEVITY 1.5 CAL/FIBER,) LIQD Place 237 mLs into feeding tube 4 (four) times daily. FWF before and after each feeding 12/24/23   Newlin, Enobong, MD  spironolactone  (ALDACTONE ) 25 MG tablet PLACE 0.5 TABLETS (12.5 MG TOTAL) INTO FEEDING TUBE DAILY. 10/01/23   Newlin, Enobong, MD  Water  For Irrigation, Sterile (FREE WATER ) SOLN 100ml free water  via tube before and after each feeding 05/25/23   Cheryle Page, MD    Allergies: Penicillin g and Penicillins    Review of Systems  Updated Vital Signs  Vitals:   03/22/24 1103 03/22/24 1300 03/22/24 1545 03/22/24 1547  BP:  104/78 121/78  Pulse:  84 70   Resp:  17 11   Temp:    (!) 97.5 F (36.4 C)  TempSrc:    Oral  SpO2:  100% 100%   Weight: 45 kg     Height: 5' 3 (1.6 m)        Physical Exam Vitals and nursing note reviewed.  Constitutional:      Comments: Thin, poor muscle tone  HENT:     Head: Normocephalic.  Eyes:     Extraocular Movements: Extraocular movements intact.  Cardiovascular:     Rate and Rhythm: Normal rate.     Pulses:          Dorsalis pedis pulses are 2+ on  the left side.       Posterior tibial pulses are 2+ on the left side.  Pulmonary:     Effort: Pulmonary effort is normal.  Musculoskeletal:     Right lower leg: No edema.     Left lower leg: No edema.     Comments: No obvious point tenderness to the left lower extremity, patient with full range of motion. Diffuse muscle atrophy at baseline.   Skin:    General: Skin is warm and dry.  Neurological:     Mental Status: She is alert and oriented to person, place, and time.     Sensory: No sensory deficit.     (all labs ordered are listed, but only abnormal results are displayed) Labs Reviewed - No data to display  EKG: None  Radiology: No results found.   Procedures   Medications Ordered in the ED  ketorolac  (TORADOL ) 15 MG/ML injection 15 mg (15 mg Intramuscular Given 03/22/24 1319)  morphine  (PF) 4 MG/ML injection 4 mg (4 mg Intravenous Given 03/22/24 1504)  iohexol  (OMNIPAQUE ) 350 MG/ML injection 100 mL (100 mLs Intravenous Contrast Given 03/22/24 1559)    Clinical Course as of 03/22/24 1540  Sat Mar 22, 2024  [id number] 53 year old female with history of myotonic dystrophy chronic weakness dysphagia.  She is on chronic oxygen .  Complaining of left leg pain for days to a week.  No known trauma.  She said she had a history of right leg pain for months but now it is in the left leg.  No back pain.  Getting labs and imaging.  May need input from neurology [MB]    Clinical Course User Index [MB] Towana Ozell BROCKS, MD                                 Medical Decision Making This patient presents to the ED for concern of leg pain, this involves an extensive number of treatment options, and is a complaint that carries with it a high risk of complications and morbidity.  The differential diagnosis includes myotonic dystrophy complication, electrolyte disturbance, arterial insufficiency, chronic pain syndrome.   Co morbidities that complicate the patient evaluation  Myotonic dystrophy,  COPD on oxygen , cardiac arrest, hypothyroidism   Additional history obtained:  Additional history obtained from record review External records from outside source obtained and reviewed including PCP and neurology notes   Lab Tests:  I Ordered, and personally interpreted labs.  The pertinent results include: CBC unremarkable.  BMP notable for mild hypercalcemia, otherwise unremarkable.  Serum hCG negative.   Imaging Studies ordered:  I ordered imaging studies including CT runoff study  I independently visualized and interpreted imaging which showed No acute arterial abnormality. Patent  lower extremity vasculature to the proximal calf. Cannot assess calf vessels due to contrast bolus timing.  I agree with the radiologist interpretation   Cardiac Monitoring: / EKG:  The patient was maintained on a cardiac monitor.  I personally viewed and interpreted the cardiac monitored which showed an underlying rhythm of: NSR  Problem List / ED Course / Critical interventions / Medication management  I ordered medication including Toradol  and morphine  for pain Reevaluation of the patient after these medicines showed that the patient improved I have reviewed the patients home medicines and have made adjustments as needed   Social Determinants of Health:  Former tobacco use   Test / Admission - Considered:  Physical exam is largely unremarkable as above, full range of motion in the left lower extremity, good pulses, no focal tenderness to palpation, notable for diffuse muscle wasting which is consistent with her diagnosis of myotonic dystrophy, no sensory deficits or radicular type symptoms.  Patient has a complex medical history including myotonic dystrophy, it is unclear if this leg pain is secondary to her underlying conditions but it does appear to be different from her baseline.  I did proceed with CT runoff study of the lower extremities to assess for vascular causes that may be  contributing to this discomfort, imaging was reassuring as above.  I suspect that patient's pain may be related to her chronic medical conditions, I advised her to continue Tylenol  through her PEG tube as previously directed, will provide lidocaine  patches and recommendation for Voltaren gel to be applied to the affected extremity for additional relief of pain.  Pain control is difficult in this patient given that all of her medications must be instilled via her PEG tube.  I do feel that this patient would benefit from a pain management referral, I discussed this in depth with her and advised her to speak with her primary care provider in regard to this.  She is scheduled to follow-up with her neurologist later this year.  Patient voiced understanding and is in agreement with this plan.  Return precautions discussed, she is appropriate for discharge at this time.    Amount and/or Complexity of Data Reviewed Labs: ordered. Radiology: ordered.  Risk Prescription drug management.       Final diagnoses:  Leg pain, left    ED Discharge Orders          Ordered    lidocaine  (LIDODERM ) 5 %  Every 24 hours        03/22/24 1759               Cynthia Rocky SAILOR, PA-C 03/22/24 1808    Towana Ozell BROCKS, MD 03/23/24 [phone]

## 2024-03-22 NOTE — Discharge Instructions (Addendum)
 Follow-up with your primary care provider this week to discuss pain control management for your recurrent left leg pain.  You may benefit from a referral to a pain management specialist, please discuss this with your primary care provider.  Follow-up with your neurologist as previously discussed.  Continue Tylenol  as previously directed.  Start lidocaine  patches, apply to the affected extremity daily as needed for pain.  You may apply Voltaren gel to the affected extremity, this can be found over-the-counter, please follow the directions as indicated on the packaging.  Return to the emergency department if your symptoms worsen.

## 2024-03-25 ENCOUNTER — Ambulatory Visit: Admitting: Physical Therapy

## 2024-03-25 VITALS — BP 96/66 | HR 93

## 2024-03-25 DIAGNOSIS — R5383 Other fatigue: Secondary | ICD-10-CM | POA: Diagnosis not present

## 2024-03-25 DIAGNOSIS — M79605 Pain in left leg: Secondary | ICD-10-CM

## 2024-03-25 DIAGNOSIS — M79604 Pain in right leg: Secondary | ICD-10-CM

## 2024-03-25 DIAGNOSIS — R1310 Dysphagia, unspecified: Secondary | ICD-10-CM | POA: Diagnosis not present

## 2024-03-25 DIAGNOSIS — Z931 Gastrostomy status: Secondary | ICD-10-CM | POA: Diagnosis not present

## 2024-03-25 DIAGNOSIS — M6281 Muscle weakness (generalized): Secondary | ICD-10-CM

## 2024-03-25 DIAGNOSIS — R2681 Unsteadiness on feet: Secondary | ICD-10-CM | POA: Diagnosis not present

## 2024-03-25 DIAGNOSIS — G931 Anoxic brain damage, not elsewhere classified: Secondary | ICD-10-CM | POA: Diagnosis not present

## 2024-03-25 DIAGNOSIS — R2689 Other abnormalities of gait and mobility: Secondary | ICD-10-CM | POA: Diagnosis not present

## 2024-03-25 DIAGNOSIS — I5043 Acute on chronic combined systolic (congestive) and diastolic (congestive) heart failure: Secondary | ICD-10-CM | POA: Diagnosis not present

## 2024-03-25 DIAGNOSIS — E43 Unspecified severe protein-calorie malnutrition: Secondary | ICD-10-CM | POA: Diagnosis not present

## 2024-03-25 DIAGNOSIS — R29898 Other symptoms and signs involving the musculoskeletal system: Secondary | ICD-10-CM | POA: Diagnosis not present

## 2024-03-25 NOTE — Therapy (Addendum)
 OUTPATIENT PHYSICAL THERAPY NEURO TREATMENT   Patient Name: KRYSTYNA CLECKLEY MRN: [id number] DOB:24-May-1971, 53 y.o., female Today's Date: 03/25/2024   PCP: Delbert Clam, MD REFERRING PROVIDER: Tobie Tonita POUR, DO  END OF SESSION:  PT End of Session - 03/25/24 1552     Visit Number 4    Number of Visits 9   with eval   Date for PT Re-Evaluation 04/21/24    Authorization Type [employer] Dual    PT Start Time 1536   Therapist running late   PT Stop Time 1619    PT Time Calculation (min) 43 min    Equipment Utilized During Treatment Oxygen     Activity Tolerance Patient limited by pain    Behavior During Therapy [facility] for tasks assessed/performed           Past Medical History:  Diagnosis Date   Bifascicular block 10/02/2014   Cataract    Dyspnea    Excess ear wax    Multinodular goiter    Pneumonia    Presence of permanent cardiac pacemaker    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   Sickle cell trait (HCC)    Watery eyes    Left   Weakness of both legs    Past Surgical History:  Procedure Laterality Date   BREAST BIOPSY Right    ESOPHAGOGASTRODUODENOSCOPY N/A 02/19/2024   Procedure: EGD (ESOPHAGOGASTRODUODENOSCOPY);  Surgeon: Nandigam, Kavitha V, MD;  Location: [facility];  Service: Gastroenterology;  Laterality: N/A;   EYE SURGERY Left 2017   related to blockage in my nose   EYE SURGERY Right 05/2019   INSERT / REPLACE / REMOVE PACEMAKER  02/15/2017   IR GASTROSTOMY TUBE MOD SED  01/01/2023   IR REPLACE G-TUBE SIMPLE WO FLUORO  12/13/2023   PACEMAKER IMPLANT N/A 02/15/2017   Procedure: Pacemaker Implant;  Surgeon: Fernande Elspeth BROCKS, MD;  Location: [facility] INVASIVE CV LAB;  Service: Cardiovascular;  Laterality: N/A;   RIGHT/LEFT HEART CATH AND CORONARY ANGIOGRAPHY N/A 11/16/2022   Procedure: RIGHT/LEFT HEART CATH AND CORONARY ANGIOGRAPHY;  Surgeon: Rolan Ezra RAMAN, MD;  Location: [facility] INVASIVE CV LAB;  Service: Cardiovascular;   Laterality: N/A;   THYROIDECTOMY N/A 04/21/2021   Procedure: TOTAL THYROIDECTOMY;  Surgeon: Rubin Calamity, MD;  Location: [facility] OR;  Service: General;  Laterality: N/A;   Patient Active Problem List   Diagnosis Date Noted   Pain of upper abdomen 02/19/2024   Duodenal nodule 02/19/2024   Aspiration pneumonia (HCC) 05/21/2023   Gastrointestinal tube in situ (HCC) 03/13/2023   Diarrhea 01/23/2023   SIRS (systemic inflammatory response syndrome) (HCC) 01/23/2023   Chronic respiratory failure with hypoxia (HCC) 01/23/2023   Dysphagia 01/23/2023   Chronic systolic CHF (congestive heart failure) (HCC) 01/23/2023   Acute on chronic respiratory failure with hypoxia (HCC) 12/07/2022   Tracheostomy status (HCC) 12/07/2022   Generalized anxiety disorder 12/06/2022   Acute systolic heart failure (HCC) 11/26/2022   Drug-induced torsades de pointes (HCC) 11/17/2022   Acute systolic CHF (congestive heart failure) (HCC) 11/16/2022   Acute hypoxemic respiratory failure (HCC) 11/15/2022   Acute on chronic combined systolic and diastolic CHF (congestive heart failure) (HCC) 11/15/2022   Protein-calorie malnutrition, severe 11/15/2022   Cardiac arrest (HCC) 11/14/2022   Healthcare-associated pneumonia 11/11/2022   Hypothyroidism 11/07/2021   Thyroid  nodule 04/21/2021   S/P total thyroidectomy 04/21/2021   Sinus node dysfunction (HCC) 03/10/2020   Weakness of both legs    Watery eyes    Sickle cell  trait (HCC)    Presence of permanent cardiac pacemaker    Excess ear wax    Depression    Anemia    Cardiac pacemaker in situ 03/01/2017   Mobitz type 2 second degree heart block 02/15/2017   Symptomatic advanced heart block 02/12/2017   SOB (shortness of breath) 10/02/2014   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   Bifascicular block 10/02/2014   Infection due to trichomonas vaginalis 09/15/2014   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    ONSET DATE:  03/03/2024 (referral date)  REFERRING DIAG: G71.11 (ICD-10-CM) - Myotonic dystrophy, type 1 (HCC) R53.83 (ICD-10-CM) - Other fatigue M62.81 (ICD-10-CM) - Muscle weakness (generalized)  THERAPY DIAG:  Muscle weakness (generalized)  Unsteadiness on feet  Other abnormalities of gait and mobility  Pain in right leg  Pain in left leg  Rationale for Evaluation and Treatment: Rehabilitation  SUBJECTIVE:                                                                                                                                                                                             SUBJECTIVE STATEMENT:  Pt presents w/rollator. States she went to the ED for her leg pain and was provided w/Voltaren and Lidocaine  patches. Used the Voltaren for the first time today and states her pain moved from her foot to her ankle but did not really help. Could not get an appointment to see her PCP until 8/26 to ask about pain management. No falls.    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  Over the years, she developed worsening stiffness of her fingers and hands.  she ultimately was genetically tested at the age of 48 which confirmed the diagnosis of myotonic dystrophy type 1. She has a strong family history of DM1 including maternal aunt, cousins x 2, and younger sister.  She does not have any children and has no future plans for pregnancy.   Symptoms were relatively stable until her mid-30s and then started developing fatigue, weakness, daytime sleepiness, and shortness of breath with exertion.  She walks independently but was told previously told  to use leg braces.  Over the past few years, she noticed intermittent difficulty swallowing liquids. She underwent barium swallow which showed signs of aspiration and she was  recommended to use a straw and use chin tuck position.     She was seeing several neurologists over the years at [facility] in [place] .  She is not working and has been on disability since 2010.  PAIN:  Are you having pain? Yes: NPRS scale: 6/10 Pain location: R hip/groin area Pain description: achy pain Aggravating factors: trying to lift leg up, sometimes bothers her when walking, sitting down for extended period of time Relieving factors: Tylenol  if she can't stand the pain  Are you having pain? Yes: NPRS scale: 10/10 Pain location: LLE but especially her L foot Pain description: it just hurts Aggravating factors: wakes her up because it is hurting Relieving factors: Tylenol  every 4-6 hours  PRECAUTIONS: Fall, ICD/Pacemaker, and Other: PEG tube, supplemental O2  RED FLAGS: None   WEIGHT BEARING RESTRICTIONS: No  FALLS: Has patient fallen in last 6 months? No  LIVING ENVIRONMENT: Lives with: lives with their family Lives in: House/apartment Stairs: No (one level home) Has following equipment at home: Walker - 4 wheeled  PLOF: Independent with gait, Independent with transfers, and Requires assistive device for independence  PATIENT GOALS: stop the pain  OBJECTIVE:  Note: Objective measures were completed at Evaluation unless otherwise noted.  DIAGNOSTIC FINDINGS: None updated or relevant to this POC  COGNITION: Overall cognitive status: Within functional limits for tasks assessed   SENSATION: Decreased light touch in RLE  COORDINATION: Not assessed  EDEMA:  None  POSTURE: rounded shoulders, forward head, and posterior pelvic tilt  PALPATION: tenderness to palpation at R anterior hip region and down into R quads; pain with hip IR, with compression through joint, with hip  flexion overpressure, mild pain with R hip distraction  LOWER EXTREMITY ROM:     Passive  Right Eval Left Eval  Hip flexion Pain with overpressure   Hip extension    Hip abduction    Hip adduction    Hip internal rotation Pain with PROM   Hip external rotation    Knee flexion    Knee extension    Ankle dorsiflexion    Ankle plantarflexion    Ankle inversion    Ankle eversion     (Blank rows = not tested)  LOWER EXTREMITY MMT:    MMT Right Eval Left Eval  Hip flexion Not tested due to pain 3-  Hip extension    Hip abduction    Hip adduction    Hip internal rotation    Hip external rotation    Knee flexion 3- 3-  Knee extension 3- 3-  Ankle dorsiflexion 3 3  Ankle plantarflexion    Ankle inversion    Ankle eversion    (Blank rows = not tested)  BED MOBILITY:  Mod I per pt report, sleeps in regular bed, no bedrails  TRANSFERS: Sit to stand: Modified independence  Assistive device utilized: Environmental consultant - 4 wheeled     Stand to sit: Modified independence  Assistive device utilized: Environmental consultant - 4 wheeled     Chair to chair: Modified independence  Assistive device utilized: Environmental consultant - 4 wheeled       RAMP:  Not tested  CURB:  Not tested  STAIRS: Not tested GAIT: Findings:  Gait pattern: decreased hip/knee flexion- Right, decreased hip/knee flexion- Left, and trunk flexed Distance walked: various clinic distances Assistive device utilized: Walker - 4 wheeled Level of assistance: Modified independence Comments: see above   FUNCTIONAL TESTS:       VITALS Vitals:   03/25/24  1555 03/25/24 1603  BP: (!) 82/57 96/66  Pulse: 98 93                                                                                                                                TREATMENT DATE:   Self-care/home management  Assessed vitals in seated and supine position (see above) and pt hypotensive. Pt reports she has not ingested much water  today and unable to provide in clinic due to  PEG tube. Pt denied lightheadedness/dizziness.  Discussed pt reaching out to Dr. Newlin regarding pain management referral prior to her appointment on 8/26. Pt reports she will try. Informed pt she can also ask Dr. Tobie, pt verbalized understanding.   Ther Act  Trigger Point Dry Needling  Initial Treatment: Pt instructed on Dry Needling rational, procedures, and possible side effects. Pt instructed to expect mild to moderate muscle soreness later in the day and/or into the next day.  Pt instructed in methods to reduce muscle soreness. Pt instructed to continue prescribed HEP. Patient was educated on signs and symptoms of infection and other risk factors and advised to seek medical attention should they occur.  Patient verbalized understanding of these instructions and education.   Patient Verbal Consent Given: Yes Education Handout Provided: Previously Provided Muscles Treated: R hip adductors Electrical Stimulation Performed: No Treatment Response/Outcome: deep ache/pressure; muscle twitch detected     PATIENT EDUCATION: Education details: continue HEP, reach out to PCP/Neurologist regarding need for pain management referral  Person educated: Patient Education method: Explanation, Demonstration, Tactile cues, and Verbal cues Education comprehension: verbalized understanding, returned demonstration, verbal cues required, tactile cues required, and needs further education  HOME EXERCISE PROGRAM: Access Code: [email or web address] URL: [email or web address] Date: 03/14/2024 Prepared by: Marlon Marilyne Haseley  Exercises - Supine Butterfly Groin Stretch  - 1 x daily - 7 x weekly - 10 reps - 10-15 seconds hold - Supine Hip Adduction Isometric with Ball  - 1 x daily - 7 x weekly - 1-2 sets - 15-20 reps - 3-5 seconds hold - Supine Lower Trunk Rotation  - 1 x daily - 7 x weekly - 3 sets - 10 reps - Clamshell  - 1 x daily - 7 x weekly - 3 sets - 10 reps - Seated Hamstring Stretch with Strap   - 1 x daily - 7 x weekly - 1 sets - 3-5 reps - 30 sec hold  GOALS: Goals reviewed with patient? Yes  SHORT TERM GOALS=LONG TERM GOALS due to length of POC   LONG TERM GOALS: Target date: 04/10/2024   Pt will be independent with final HEP for improved strength, balance, transfers and gait as well as for independence with management of pain symptoms. Baseline:  Goal status: INITIAL  2.  Pt will improve gait velocity to at least 2.5 ft/sec for improved gait efficiency and performance at mod I level  Baseline: 2.3 ft/sec mod I with rollator (7/7) Goal status: INITIAL  3.  Pt will report </= 4/10 pain in her RLE at the most to demonstrate improved function and decreased pain level. Baseline: 6/10 (7/7) Goal status: INITIAL    ASSESSMENT:  CLINICAL IMPRESSION: Session limited due to pt's high pain level and hypotension. Pt reports she went to ED for her leg pain and was given Voltaren gel and Lidocaine  patches. Tried the Voltaren today but did not get much pain relief. Is working on her HEP at home but does not get any pain relief. Is not sleeping well and reports her pain is constant. MD in ED recommended pt seek referral for pain management via PCP, but pt unable to see PCP until 8/26. Recommended pt reach out to PCP to ask for pain management referral prior to her appointment as her pain is not improving w/conservative treatment. Pt's BP low today, pt reports she has not had much water . Tried TPDN to R hip adductor for pain relief, which pt tolerated well. Continue POC.    OBJECTIVE IMPAIRMENTS: cardiopulmonary status limiting activity, decreased activity tolerance, decreased balance, decreased endurance, decreased knowledge of condition, decreased ROM, decreased strength, improper body mechanics, postural dysfunction, and pain.   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   REHAB POTENTIAL: Fair chronic nature of her myotonic dystrophy type 1, has been seen for PT for multiple POC previously  CLINICAL DECISION MAKING: Stable/uncomplicated  EVALUATION COMPLEXITY: Low  PLAN:  PT FREQUENCY: 2x/week  PT DURATION: 4 weeks  PLANNED INTERVENTIONS: [id number]- PT Re-evaluation, 97750- Physical Performance Testing, 97110-Therapeutic exercises, 97530- Therapeutic activity, [id number]- Neuromuscular re-education, 97535- Self Care, [id number]- Manual therapy, [id number]- Gait training, [phone]- Aquatic Therapy, [phone]- Electrical stimulation (manual), [phone] (1-2 muscles), [id number] (3+ muscles)- Dry Needling, Patient/Family education, Balance training, Stair training, Taping, Joint mobilization, Spinal mobilization, DME instructions, Cryotherapy, and Moist heat  PLAN FOR NEXT SESSION:  How did she feel after TPDN? Did she reach out to PCP?  review previous HEP/initiate new HEP to address impaired endurance and R hip/LE pain (R hip stretches and strengthening), R hip adductor mobility, R glute med strength   Cutter Passey E Niall Illes, PT, DPT Taylor Turkalo, PT, DPT, CSRS    03/25/2024, 4:20 PM

## 2024-03-27 ENCOUNTER — Ambulatory Visit: Admitting: Physical Therapy

## 2024-03-27 VITALS — BP 80/61 | HR 62

## 2024-03-27 DIAGNOSIS — R2689 Other abnormalities of gait and mobility: Secondary | ICD-10-CM

## 2024-03-27 DIAGNOSIS — M79605 Pain in left leg: Secondary | ICD-10-CM

## 2024-03-27 DIAGNOSIS — M6281 Muscle weakness (generalized): Secondary | ICD-10-CM

## 2024-03-27 DIAGNOSIS — R2681 Unsteadiness on feet: Secondary | ICD-10-CM

## 2024-03-27 DIAGNOSIS — R5383 Other fatigue: Secondary | ICD-10-CM | POA: Diagnosis not present

## 2024-03-27 DIAGNOSIS — M79604 Pain in right leg: Secondary | ICD-10-CM

## 2024-03-27 DIAGNOSIS — R29898 Other symptoms and signs involving the musculoskeletal system: Secondary | ICD-10-CM | POA: Diagnosis not present

## 2024-03-27 NOTE — Therapy (Signed)
 OUTPATIENT PHYSICAL THERAPY NEURO TREATMENT   Patient Name: Cynthia MCCLAY MRN: [id number] DOB:Jan 24, 1971, 53 y.o., female Today's Date: 03/27/2024   PCP: Delbert Clam, MD REFERRING PROVIDER: Tobie Tonita POUR, DO  END OF SESSION:  PT End of Session - 03/27/24 1436     Visit Number 5    Number of Visits 9   with eval   Date for PT Re-Evaluation 04/21/24    Authorization Type [employer] Dual    PT Start Time 1435    PT Stop Time 1517    PT Time Calculation (min) 42 min    Activity Tolerance Patient tolerated treatment well;Treatment limited secondary to medical complications (Comment)   Hypotension   Behavior During Therapy [facility] for tasks assessed/performed            Past Medical History:  Diagnosis Date   Bifascicular block 10/02/2014   Cataract    Dyspnea    Excess ear wax    Multinodular goiter    Pneumonia    Presence of permanent cardiac pacemaker    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   Sickle cell trait (HCC)    Watery eyes    Left   Weakness of both legs    Past Surgical History:  Procedure Laterality Date   BREAST BIOPSY Right    ESOPHAGOGASTRODUODENOSCOPY N/A 02/19/2024   Procedure: EGD (ESOPHAGOGASTRODUODENOSCOPY);  Surgeon: Nandigam, Kavitha V, MD;  Location: [facility];  Service: Gastroenterology;  Laterality: N/A;   EYE SURGERY Left 2017   related to blockage in my nose   EYE SURGERY Right 05/2019   INSERT / REPLACE / REMOVE PACEMAKER  02/15/2017   IR GASTROSTOMY TUBE MOD SED  01/01/2023   IR REPLACE G-TUBE SIMPLE WO FLUORO  12/13/2023   PACEMAKER IMPLANT N/A 02/15/2017   Procedure: Pacemaker Implant;  Surgeon: Fernande Elspeth BROCKS, MD;  Location: [facility] INVASIVE CV LAB;  Service: Cardiovascular;  Laterality: N/A;   RIGHT/LEFT HEART CATH AND CORONARY ANGIOGRAPHY N/A 11/16/2022   Procedure: RIGHT/LEFT HEART CATH AND CORONARY ANGIOGRAPHY;  Surgeon: Rolan Ezra RAMAN, MD;  Location: [facility] INVASIVE CV LAB;  Service:  Cardiovascular;  Laterality: N/A;   THYROIDECTOMY N/A 04/21/2021   Procedure: TOTAL THYROIDECTOMY;  Surgeon: Rubin Calamity, MD;  Location: [facility] OR;  Service: General;  Laterality: N/A;   Patient Active Problem List   Diagnosis Date Noted   Pain of upper abdomen 02/19/2024   Duodenal nodule 02/19/2024   Aspiration pneumonia (HCC) 05/21/2023   Gastrointestinal tube in situ (HCC) 03/13/2023   Diarrhea 01/23/2023   SIRS (systemic inflammatory response syndrome) (HCC) 01/23/2023   Chronic respiratory failure with hypoxia (HCC) 01/23/2023   Dysphagia 01/23/2023   Chronic systolic CHF (congestive heart failure) (HCC) 01/23/2023   Acute on chronic respiratory failure with hypoxia (HCC) 12/07/2022   Tracheostomy status (HCC) 12/07/2022   Generalized anxiety disorder 12/06/2022   Acute systolic heart failure (HCC) 11/26/2022   Drug-induced torsades de pointes (HCC) 11/17/2022   Acute systolic CHF (congestive heart failure) (HCC) 11/16/2022   Acute hypoxemic respiratory failure (HCC) 11/15/2022   Acute on chronic combined systolic and diastolic CHF (congestive heart failure) (HCC) 11/15/2022   Protein-calorie malnutrition, severe 11/15/2022   Cardiac arrest (HCC) 11/14/2022   Healthcare-associated pneumonia 11/11/2022   Hypothyroidism 11/07/2021   Thyroid  nodule 04/21/2021   S/P total thyroidectomy 04/21/2021   Sinus node dysfunction (HCC) 03/10/2020   Weakness of both legs    Watery eyes    Sickle cell trait (HCC)  Presence of permanent cardiac pacemaker    Excess ear wax    Depression    Anemia    Cardiac pacemaker in situ 03/01/2017   Mobitz type 2 second degree heart block 02/15/2017   Symptomatic advanced heart block 02/12/2017   SOB (shortness of breath) 10/02/2014   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   Bifascicular block 10/02/2014   Infection due to trichomonas vaginalis 09/15/2014   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     ONSET DATE: 03/03/2024 (referral date)  REFERRING DIAG: G71.11 (ICD-10-CM) - Myotonic dystrophy, type 1 (HCC) R53.83 (ICD-10-CM) - Other fatigue M62.81 (ICD-10-CM) - Muscle weakness (generalized)  THERAPY DIAG:  Muscle weakness (generalized)  Unsteadiness on feet  Other abnormalities of gait and mobility  Pain in right leg  Pain in left leg  Rationale for Evaluation and Treatment: Rehabilitation  SUBJECTIVE:                                                                                                                                                                                             SUBJECTIVE STATEMENT:  Pt presents w/rollator. States she was fine after TPDN. Still having pain but it is better, is mostly using Voltaren. Got an appointment to see Dr. Newlin next week. No falls.    Pt accompanied by: self, pt takes transportation to her appointments (does not drive)  PERTINENT HISTORY: PMH: myotonic dystrophy type I complicated by cardiac arryhtmia s/p PPM (June 2018), dysphagia s/p PEG, and dyspnea on supplemental oxygen  via nasal canula and hyperlipidemia  History of current illness: Starting around the age of 60, she started having lock jaw and stiffness of the hands.  Over the years, she developed worsening stiffness of her fingers and hands.  she ultimately was genetically tested at the age of 47 which confirmed the diagnosis of myotonic dystrophy type 1. She has a strong family history of DM1 including maternal aunt, cousins x 2, and younger sister.  She does not have any children and has no future plans for pregnancy.   Symptoms were relatively stable until her mid-30s and then started developing fatigue, weakness, daytime sleepiness, and shortness of breath with exertion.  She walks independently but was told previously told to use leg braces.  Over the past few years, she noticed intermittent difficulty swallowing liquids. She underwent barium swallow which  showed signs of aspiration and she was recommended to use a straw and use chin tuck position.     She was seeing several neurologists over the years at [facility] in [place] .  She is not working and has been  on disability since 2010.  PAIN:  Are you having pain? Yes: NPRS scale: 4/10 Pain location: R hip/groin area Pain description: achy pain Aggravating factors: trying to lift leg up, sometimes bothers her when walking, sitting down for extended period of time Relieving factors: Tylenol  if she can't stand the pain  Are you having pain? Yes: NPRS scale: 10/10 Pain location: LLE but especially her L foot Pain description: it just hurts Aggravating factors: wakes her up because it is hurting Relieving factors: Tylenol  every 4-6 hours  PRECAUTIONS: Fall, ICD/Pacemaker, and Other: PEG tube, supplemental O2  RED FLAGS: None   WEIGHT BEARING RESTRICTIONS: No  FALLS: Has patient fallen in last 6 months? No  LIVING ENVIRONMENT: Lives with: lives with their family Lives in: House/apartment Stairs: No (one level home) Has following equipment at home: Walker - 4 wheeled  PLOF: Independent with gait, Independent with transfers, and Requires assistive device for independence  PATIENT GOALS: stop the pain  OBJECTIVE:  Note: Objective measures were completed at Evaluation unless otherwise noted.  DIAGNOSTIC FINDINGS: None updated or relevant to this POC  COGNITION: Overall cognitive status: Within functional limits for tasks assessed   SENSATION: Decreased light touch in RLE  COORDINATION: Not assessed  EDEMA:  None  POSTURE: rounded shoulders, forward head, and posterior pelvic tilt  PALPATION: tenderness to palpation at R anterior hip region and down into R quads; pain with hip IR, with compression through joint, with hip flexion overpressure, mild pain with R hip distraction  LOWER EXTREMITY ROM:     Passive  Right Eval Left Eval  Hip flexion Pain with  overpressure   Hip extension    Hip abduction    Hip adduction    Hip internal rotation Pain with PROM   Hip external rotation    Knee flexion    Knee extension    Ankle dorsiflexion    Ankle plantarflexion    Ankle inversion    Ankle eversion     (Blank rows = not tested)  LOWER EXTREMITY MMT:    MMT Right Eval Left Eval  Hip flexion Not tested due to pain 3-  Hip extension    Hip abduction    Hip adduction    Hip internal rotation    Hip external rotation    Knee flexion 3- 3-  Knee extension 3- 3-  Ankle dorsiflexion 3 3  Ankle plantarflexion    Ankle inversion    Ankle eversion    (Blank rows = not tested)  BED MOBILITY:  Mod I per pt report, sleeps in regular bed, no bedrails  TRANSFERS: Sit to stand: Modified independence  Assistive device utilized: Environmental consultant - 4 wheeled     Stand to sit: Modified independence  Assistive device utilized: Environmental consultant - 4 wheeled     Chair to chair: Modified independence  Assistive device utilized: Environmental consultant - 4 wheeled       RAMP:  Not tested  CURB:  Not tested  STAIRS: Not tested GAIT: Findings:  Gait pattern: decreased hip/knee flexion- Right, decreased hip/knee flexion- Left, and trunk flexed Distance walked: various clinic distances Assistive device utilized: Walker - 4 wheeled Level of assistance: Modified independence Comments: see above   FUNCTIONAL TESTS:       VITALS Vitals:   03/27/24 1439 03/27/24 1451 03/27/24 1501  BP: (!) 89/68 (!) 86/62 Comment: After SciFit (!) 80/61  Pulse: 92 82 62  TREATMENT DATE:   Self-care/home management/Ther Act  Assessed vitals in seated position (see above) and pt's BP continues to be low. Pt denied lightheadedness/dizziness. Continued to assess throughout session  SciFit multi-peaks level 5.0 for 8 minutes using BUE/BLEs for neural priming for  reciprocal movement, dynamic cardiovascular warmup and increased amplitude of stepping. RPE of 0/10 following activity  Reassessed BP following SciFit (see above) and BP dropped. Assessed vitals in standing and BP at 80/61 mmHg, but not orthostatic. Discussed concerns w/low BP and pt in agreement to have therapist message cardiologist Earley Bihari, MD) and inform her of low BP readings. Pt reports she is unable to tell when her BP is low as she is always fatigued and states her urine is a light yellow color so she does not think she is dehydrated.    PATIENT EDUCATION: Education details: continue HEP, plan to reach out to cardiologist regarding low BP Person educated: Patient Education method: Explanation Education comprehension: verbalized understanding  HOME EXERCISE PROGRAM: Access Code: [email or web address] URL: [email or web address] Date: 03/14/2024 Prepared by: Marlon Shanaya Schneck  Exercises - Supine Butterfly Groin Stretch  - 1 x daily - 7 x weekly - 10 reps - 10-15 seconds hold - Supine Hip Adduction Isometric with Ball  - 1 x daily - 7 x weekly - 1-2 sets - 15-20 reps - 3-5 seconds hold - Supine Lower Trunk Rotation  - 1 x daily - 7 x weekly - 3 sets - 10 reps - Clamshell  - 1 x daily - 7 x weekly - 3 sets - 10 reps - Seated Hamstring Stretch with Strap  - 1 x daily - 7 x weekly - 1 sets - 3-5 reps - 30 sec hold  GOALS: Goals reviewed with patient? Yes  SHORT TERM GOALS=LONG TERM GOALS due to length of POC   LONG TERM GOALS: Target date: 04/10/2024   Pt will be independent with final HEP for improved strength, balance, transfers and gait as well as for independence with management of pain symptoms. Baseline:  Goal status: INITIAL  2.  Pt will improve gait velocity to at least 2.5 ft/sec for improved gait efficiency and performance at mod I level  Baseline: 2.3 ft/sec mod I with rollator (7/7) Goal status: INITIAL  3.  Pt will report </= 4/10 pain in her RLE at the most to  demonstrate improved function and decreased pain level. Baseline: 6/10 (7/7) Goal status: INITIAL    ASSESSMENT:  CLINICAL IMPRESSION: Session limited due to low BP. Had pt perform 8 minutes on SciFit to boost BP, but BP lower after activity. Pt is not orthostatic but understands concerns w/lower BP readings. Will plan on messaging cardiologist to inform her of low BP w/pt permission. Continue POC.    OBJECTIVE IMPAIRMENTS: cardiopulmonary status limiting activity, decreased activity tolerance, decreased balance, decreased endurance, decreased knowledge of condition, decreased ROM, decreased strength, improper body mechanics, postural dysfunction, and pain.   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   REHAB POTENTIAL: Fair chronic nature of her myotonic dystrophy type 1, has been seen for PT for multiple POC previously  CLINICAL DECISION MAKING: Stable/uncomplicated  EVALUATION COMPLEXITY: Low  PLAN:  PT FREQUENCY: 2x/week  PT DURATION: 4 weeks  PLANNED INTERVENTIONS: [id number]- PT Re-evaluation, 97750- Physical Performance Testing, 97110-Therapeutic exercises, 97530- Therapeutic activity, [id number]- Neuromuscular re-education, 97535- Self Care, [id number]- Manual therapy, [id number]- Gait training, [phone]- Aquatic Therapy, [phone]- Electrical stimulation (manual), [phone] (1-2 muscles), [id number] (3+ muscles)- Dry Needling, Patient/Family education, Balance training, Stair training, Taping, Joint mobilization, Spinal mobilization, DME instructions,  Cryotherapy, and Moist heat  PLAN FOR NEXT SESSION: Check BP. Did we hear back from cardiologist?  review previous HEP/initiate new HEP to address impaired endurance and R hip/LE pain (R hip stretches and strengthening), R hip adductor mobility, R glute med strength   Rion Catala E Torri Langston, PT, DPT  03/27/2024, 3:17 PM

## 2024-03-27 NOTE — Progress Notes (Signed)
 Remote pacemaker transmission.

## 2024-04-01 ENCOUNTER — Telehealth: Payer: Self-pay | Admitting: Family Medicine

## 2024-04-01 ENCOUNTER — Ambulatory Visit: Admitting: Physical Therapy

## 2024-04-01 VITALS — BP 92/67 | HR 79

## 2024-04-01 DIAGNOSIS — R2689 Other abnormalities of gait and mobility: Secondary | ICD-10-CM | POA: Diagnosis not present

## 2024-04-01 DIAGNOSIS — M79605 Pain in left leg: Secondary | ICD-10-CM | POA: Diagnosis not present

## 2024-04-01 DIAGNOSIS — R2681 Unsteadiness on feet: Secondary | ICD-10-CM

## 2024-04-01 DIAGNOSIS — M6281 Muscle weakness (generalized): Secondary | ICD-10-CM | POA: Diagnosis not present

## 2024-04-01 DIAGNOSIS — M79604 Pain in right leg: Secondary | ICD-10-CM | POA: Diagnosis not present

## 2024-04-01 DIAGNOSIS — R29898 Other symptoms and signs involving the musculoskeletal system: Secondary | ICD-10-CM

## 2024-04-01 DIAGNOSIS — R5383 Other fatigue: Secondary | ICD-10-CM | POA: Diagnosis not present

## 2024-04-01 NOTE — Therapy (Signed)
 OUTPATIENT PHYSICAL THERAPY NEURO TREATMENT   Patient Name: Cynthia Underwood MRN: [id number] DOB:1970-12-26, 53 y.o., female Today's Date: 04/01/2024   PCP: Delbert Clam, MD REFERRING PROVIDER: Tobie Tonita POUR, DO  END OF SESSION:  PT End of Session - 04/01/24 1402     Visit Number 6    Number of Visits 9   with eval   Date for PT Re-Evaluation 04/21/24    Authorization Type [employer] Dual    PT Start Time 1400    PT Stop Time 1442    PT Time Calculation (min) 42 min    Activity Tolerance Patient tolerated treatment well    Behavior During Therapy WFL for tasks assessed/performed             Past Medical History:  Diagnosis Date   Bifascicular block 10/02/2014   Cataract    Dyspnea    Excess ear wax    Multinodular goiter    Pneumonia    Presence of permanent cardiac pacemaker    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   Sickle cell trait (HCC)    Watery eyes    Left   Weakness of both legs    Past Surgical History:  Procedure Laterality Date   BREAST BIOPSY Right    ESOPHAGOGASTRODUODENOSCOPY N/A 02/19/2024   Procedure: EGD (ESOPHAGOGASTRODUODENOSCOPY);  Surgeon: Nandigam, Kavitha V, MD;  Location: [facility];  Service: Gastroenterology;  Laterality: N/A;   EYE SURGERY Left 2017   related to blockage in my nose   EYE SURGERY Right 05/2019   INSERT / REPLACE / REMOVE PACEMAKER  02/15/2017   IR GASTROSTOMY TUBE MOD SED  01/01/2023   IR REPLACE G-TUBE SIMPLE WO FLUORO  12/13/2023   PACEMAKER IMPLANT N/A 02/15/2017   Procedure: Pacemaker Implant;  Surgeon: Fernande Elspeth BROCKS, MD;  Location: [facility] INVASIVE CV LAB;  Service: Cardiovascular;  Laterality: N/A;   RIGHT/LEFT HEART CATH AND CORONARY ANGIOGRAPHY N/A 11/16/2022   Procedure: RIGHT/LEFT HEART CATH AND CORONARY ANGIOGRAPHY;  Surgeon: Rolan Ezra RAMAN, MD;  Location: [facility] INVASIVE CV LAB;  Service: Cardiovascular;  Laterality: N/A;   THYROIDECTOMY N/A 04/21/2021   Procedure:  TOTAL THYROIDECTOMY;  Surgeon: Rubin Calamity, MD;  Location: [facility] OR;  Service: General;  Laterality: N/A;   Patient Active Problem List   Diagnosis Date Noted   Pain of upper abdomen 02/19/2024   Duodenal nodule 02/19/2024   Aspiration pneumonia (HCC) 05/21/2023   Gastrointestinal tube in situ (HCC) 03/13/2023   Diarrhea 01/23/2023   SIRS (systemic inflammatory response syndrome) (HCC) 01/23/2023   Chronic respiratory failure with hypoxia (HCC) 01/23/2023   Dysphagia 01/23/2023   Chronic systolic CHF (congestive heart failure) (HCC) 01/23/2023   Acute on chronic respiratory failure with hypoxia (HCC) 12/07/2022   Tracheostomy status (HCC) 12/07/2022   Generalized anxiety disorder 12/06/2022   Acute systolic heart failure (HCC) 11/26/2022   Drug-induced torsades de pointes (HCC) 11/17/2022   Acute systolic CHF (congestive heart failure) (HCC) 11/16/2022   Acute hypoxemic respiratory failure (HCC) 11/15/2022   Acute on chronic combined systolic and diastolic CHF (congestive heart failure) (HCC) 11/15/2022   Protein-calorie malnutrition, severe 11/15/2022   Cardiac arrest (HCC) 11/14/2022   Healthcare-associated pneumonia 11/11/2022   Hypothyroidism 11/07/2021   Thyroid  nodule 04/21/2021   S/P total thyroidectomy 04/21/2021   Sinus node dysfunction (HCC) 03/10/2020   Weakness of both legs    Watery eyes    Sickle cell trait (HCC)    Presence of permanent cardiac pacemaker  Excess ear wax    Depression    Anemia    Cardiac pacemaker in situ 03/01/2017   Mobitz type 2 second degree heart block 02/15/2017   Symptomatic advanced heart block 02/12/2017   SOB (shortness of breath) 10/02/2014   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   Bifascicular block 10/02/2014   Infection due to trichomonas vaginalis 09/15/2014   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    ONSET DATE: 03/03/2024 (referral date)  REFERRING DIAG: G71.11 (ICD-10-CM) -  Myotonic dystrophy, type 1 (HCC) R53.83 (ICD-10-CM) - Other fatigue M62.81 (ICD-10-CM) - Muscle weakness (generalized)  THERAPY DIAG:  Muscle weakness (generalized)  Unsteadiness on feet  Other abnormalities of gait and mobility  Pain in right leg  Pain in left leg  Other symptoms and signs involving the musculoskeletal system  Rationale for Evaluation and Treatment: Rehabilitation  SUBJECTIVE:                                                                                                                                                                                             SUBJECTIVE STATEMENT:  Pt denies any acute changes since last visit. She reports that her pain is about the same in her LLE, pt reports that when she washes her inner thigh on her R leg the skin turns red and it will stay red for a while, does not burn or sting. Pt was having a lot more pain on Saturday, it did feel better initially after dry needling.  Pt sees her PCP tomorrow, plan to ask for a referral to pain management.  Pt enters clinic with her rollator, not wearing her O2.  Pt accompanied by: self, pt takes transportation to her appointments (does not drive)  PERTINENT HISTORY: PMH: myotonic dystrophy type I complicated by cardiac arryhtmia s/p PPM (June 2018), dysphagia s/p PEG, and dyspnea on supplemental oxygen  via nasal canula and hyperlipidemia  History of current illness: Starting around the age of 57, she started having lock jaw and stiffness of the hands.  Over the years, she developed worsening stiffness of her fingers and hands.  she ultimately was genetically tested at the age of 51 which confirmed the diagnosis of myotonic dystrophy type 1. She has a strong family history of DM1 including maternal aunt, cousins x 2, and younger sister.  She does not have any children and has no future plans for pregnancy.   Symptoms were relatively stable until her mid-30s and then started developing  fatigue, weakness, daytime sleepiness, and shortness of breath with exertion.  She walks independently but was told previously told to use leg  braces.  Over the past few years, she noticed intermittent difficulty swallowing liquids. She underwent barium swallow which showed signs of aspiration and she was recommended to use a straw and use chin tuck position.     She was seeing several neurologists over the years at [facility] in [place] .  She is not working and has been on disability since 2010.  PAIN:  Are you having pain? Yes: NPRS scale: 4/10 Pain location: R hip/groin area Pain description: achy pain Aggravating factors: trying to lift leg up, sometimes bothers her when walking, sitting down for extended period of time Relieving factors: Tylenol  if she can't stand the pain  Are you having pain? Yes: NPRS scale: 10/10 Pain location: LLE but especially her L foot Pain description: it just hurts Aggravating factors: wakes her up because it is hurting Relieving factors: Tylenol  every 4-6 hours  PRECAUTIONS: Fall, ICD/Pacemaker, and Other: PEG tube, supplemental O2  RED FLAGS: None   WEIGHT BEARING RESTRICTIONS: No  FALLS: Has patient fallen in last 6 months? No  LIVING ENVIRONMENT: Lives with: lives with their family Lives in: House/apartment Stairs: No (one level home) Has following equipment at home: Walker - 4 wheeled  PLOF: Independent with gait, Independent with transfers, and Requires assistive device for independence  PATIENT GOALS: stop the pain  OBJECTIVE:  Note: Objective measures were completed at Evaluation unless otherwise noted.  DIAGNOSTIC FINDINGS: None updated or relevant to this POC  COGNITION: Overall cognitive status: Within functional limits for tasks assessed   SENSATION: Decreased light touch in RLE  COORDINATION: Not assessed  EDEMA:  None  POSTURE: rounded shoulders, forward head, and posterior pelvic tilt  PALPATION:  tenderness to palpation at R anterior hip region and down into R quads; pain with hip IR, with compression through joint, with hip flexion overpressure, mild pain with R hip distraction  LOWER EXTREMITY ROM:     Passive  Right Eval Left Eval  Hip flexion Pain with overpressure   Hip extension    Hip abduction    Hip adduction    Hip internal rotation Pain with PROM   Hip external rotation    Knee flexion    Knee extension    Ankle dorsiflexion    Ankle plantarflexion    Ankle inversion    Ankle eversion     (Blank rows = not tested)  LOWER EXTREMITY MMT:    MMT Right Eval Left Eval  Hip flexion Not tested due to pain 3-  Hip extension    Hip abduction    Hip adduction    Hip internal rotation    Hip external rotation    Knee flexion 3- 3-  Knee extension 3- 3-  Ankle dorsiflexion 3 3  Ankle plantarflexion    Ankle inversion    Ankle eversion    (Blank rows = not tested)  BED MOBILITY:  Mod I per pt report, sleeps in regular bed, no bedrails  TRANSFERS: Sit to stand: Modified independence  Assistive device utilized: Environmental consultant - 4 wheeled     Stand to sit: Modified independence  Assistive device utilized: Environmental consultant - 4 wheeled     Chair to chair: Modified independence  Assistive device utilized: Environmental consultant - 4 wheeled       RAMP:  Not tested  CURB:  Not tested  STAIRS: Not tested GAIT: Findings:  Gait pattern: decreased hip/knee flexion- Right, decreased hip/knee flexion- Left, and trunk flexed Distance walked: various clinic distances Assistive device utilized: Environmental consultant -  4 wheeled Level of assistance: Modified independence Comments: see above   FUNCTIONAL TESTS:       VITALS Vitals:   04/01/24 1413  BP: 92/67  Pulse: 79                                                                                                                                  TREATMENT DATE:   Self-Care/Home Management/Ther Act  Therapist did hear back from patient's  cardiologist Earley Bihari, MD), however she has not seen the patient for at least a year and reports that patient is currently being managed by Dr. [facility]il [facility]. Pt was previously seeing Dr. Fernande who has retired, has not yet had an appointment with Dr. Rolan. Assessed vitals in seated position (see above) and pt's BP continues to be low, however improved as compared to previous session. SciFit multi-peaks level 3.0 for 8 minutes using BUE/BLEs for neural priming for reciprocal movement, dynamic cardiovascular warmup and increased amplitude of stepping. Pt reports legs feel funny like she worked out the kinks, unable to give an RPE at end of exercise. To address ongoing LE pain and muscle tightness: Modified Thomas stretch 3 x 30 sec each B (Added to HEP, see bolded below) Supine butterfly stretch 3 x 30 sec B Supine heel slides x 10 reps B   PATIENT EDUCATION: Education details: continue HEP as tolerated, with PCP tomorrow discuss referral to pain management and ongoing BP issues Person educated: Patient Education method: Explanation, Demonstration, and Handouts Education comprehension: verbalized understanding, returned demonstration, and needs further education  HOME EXERCISE PROGRAM: Access Code: [email or web address] URL: [email or web address] Date: 03/14/2024 Prepared by: Marlon Plaster  Exercises - Supine Butterfly Groin Stretch  - 1 x daily - 7 x weekly - 10 reps - 10-15 seconds hold - Supine Hip Adduction Isometric with Ball  - 1 x daily - 7 x weekly - 1-2 sets - 15-20 reps - 3-5 seconds hold - Supine Lower Trunk Rotation  - 1 x daily - 7 x weekly - 3 sets - 10 reps - Clamshell  - 1 x daily - 7 x weekly - 3 sets - 10 reps - Seated Hamstring Stretch with Strap  - 1 x daily - 7 x weekly - 1 sets - 3-5 reps - 30 sec hold - Modified Thomas Stretch  - 1 x daily - 7 x weekly - 1 sets - 3-5 reps - 30 sec hold  GOALS: Goals reviewed with patient?  Yes  SHORT TERM GOALS=LONG TERM GOALS due to length of POC   LONG TERM GOALS: Target date: 04/10/2024   Pt will be independent with final HEP for improved strength, balance, transfers and gait as well as for independence with management of pain symptoms. Baseline:  Goal status: INITIAL  2.  Pt will improve gait velocity to at least 2.5 ft/sec for improved gait efficiency and performance at mod I level  Baseline: 2.3 ft/sec mod I with rollator (7/7) Goal status: INITIAL  3.  Pt will report </= 4/10 pain in her RLE at the most to demonstrate improved function and decreased pain level. Baseline: 6/10 (7/7) Goal status: INITIAL    ASSESSMENT:  CLINICAL IMPRESSION: Emphasis of skilled PT session on continuing to monitor BP, working on global endurance training, and working on stretching and strengthening of LE to address ongoing pain and muscle tightness. Pt with ongoing low BP but much improved as compared to previous PT session. She continues to exhibit ongoing LE pain, has an appointment with her PCP tomorrow to discuss a referral to pain management. She continues to benefit from skilled PT services to work towards increased independence with management of her pain symptoms. Continue POC.    OBJECTIVE IMPAIRMENTS: cardiopulmonary status limiting activity, decreased activity tolerance, decreased balance, decreased endurance, decreased knowledge of condition, decreased ROM, decreased strength, improper body mechanics, postural dysfunction, and pain.   ACTIVITY LIMITATIONS: carrying, lifting, bending, sitting, standing, squatting, stairs, and transfers  PARTICIPATION LIMITATIONS: meal prep, cleaning, laundry, driving, shopping, and community activity  PERSONAL FACTORS: Time since onset of injury/illness/exacerbation and 3+ comorbidities:   myotonic dystrophy type I complicated by cardiac arryhtmia s/p PPM (June 2018), dysphagia s/p PEG, and dyspnea on supplemental oxygen  via nasal canula  and hyperlipidemiaare also affecting patient's functional outcome.   REHAB POTENTIAL: Fair chronic nature of her myotonic dystrophy type 1, has been seen for PT for multiple POC previously  CLINICAL DECISION MAKING: Stable/uncomplicated  EVALUATION COMPLEXITY: Low  PLAN:  PT FREQUENCY: 2x/week  PT DURATION: 4 weeks  PLANNED INTERVENTIONS: 97164- PT Re-evaluation, 97750- Physical Performance Testing, 97110-Therapeutic exercises, 97530- Therapeutic activity, [id number]- Neuromuscular re-education, 97535- Self Care, [id number]- Manual therapy, [id number]- Gait training, [phone]- Aquatic Therapy, [phone]- Electrical stimulation (manual), [phone] (1-2 muscles), [id number] (3+ muscles)- Dry Needling, Patient/Family education, Balance training, Stair training, Taping, Joint mobilization, Spinal mobilization, DME instructions, Cryotherapy, and Moist heat  PLAN FOR NEXT SESSION: Check BP. How as visit with PCP? Do we need to message new cardiologist? review previous HEP/initiate new HEP to address impaired endurance and R hip/LE pain (R hip stretches and strengthening), R hip adductor mobility, R glute med strength, SciFit level 5   Waddell Southgate, PT Waddell Southgate, PT, DPT, CSRS   04/01/2024, 2:42 PM

## 2024-04-01 NOTE — Telephone Encounter (Signed)
 Called patient to confirm upcoming appointment 04/02/2024 at 1:50 pm. Patient appointment has been successfully confirmed

## 2024-04-02 ENCOUNTER — Encounter: Payer: Self-pay | Admitting: Family Medicine

## 2024-04-02 ENCOUNTER — Ambulatory Visit: Attending: Family Medicine | Admitting: Family Medicine

## 2024-04-02 VITALS — BP 91/63 | HR 87 | Ht 63.0 in | Wt 100.4 lb

## 2024-04-02 DIAGNOSIS — M79605 Pain in left leg: Secondary | ICD-10-CM | POA: Diagnosis not present

## 2024-04-02 DIAGNOSIS — G7111 Myotonic muscular dystrophy: Secondary | ICD-10-CM | POA: Diagnosis not present

## 2024-04-02 DIAGNOSIS — E890 Postprocedural hypothyroidism: Secondary | ICD-10-CM

## 2024-04-02 DIAGNOSIS — M79604 Pain in right leg: Secondary | ICD-10-CM | POA: Diagnosis not present

## 2024-04-02 NOTE — Progress Notes (Signed)
 Subjective:  Patient ID: Cynthia Underwood, female    DOB: November 03, 1970  Age: 53 y.o. MRN: [id number]  CC: Leg Pain (Referral to pain management.)     Discussed the use of AI scribe software for clinical note transcription with the patient, who gave verbal consent to proceed.  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  History of PEG tube and  history of trach tube (status post removal), bronchiectasis.  who presents with bilateral leg pain.  She experiences bilateral leg pain, with the left leg more severely affected, extending from the hip to the foot. The pain is severe enough to disrupt sleep. Tylenol  administered via PEG tube was ineffective. Last Saturday, she visited the hospital due to the pain's severity. A CT angiogram showed no abnormalities in the lower extremity blood vessels. She was prescribed lidoderm  patches for pain management. She describes the pain as a constant ache, particularly affecting her left leg and right foot, with additional pain in the ankle and big toe, complicating shoe-tying. She has not obtained Cymbalta  prescribed by her neurologist due to insurance issues.  She takes levothyroxine  regularly but has not had a recent thyroid  labs as the last time I ordered labs she was a hard stick and it was difficult to obtain this in the clinic.  She then went on to have labs with [facility] but only a BMP was drawn.  Past Medical History:  Diagnosis Date   Bifascicular block 10/02/2014   Cataract    Dyspnea    Excess ear wax    Multinodular goiter    Pneumonia    Presence of permanent cardiac pacemaker    RBBB (right bundle branch block with  left anterior fascicular block) 10/02/2014   Sickle cell trait (HCC)    Watery eyes    Left   Weakness of both legs     Past Surgical History:  Procedure Laterality Date   BREAST BIOPSY Right    ESOPHAGOGASTRODUODENOSCOPY N/A 02/19/2024   Procedure: EGD (ESOPHAGOGASTRODUODENOSCOPY);  Surgeon: Nandigam, Kavitha V, MD;  Location: [facility];  Service: Gastroenterology;  Laterality: N/A;   EYE SURGERY Left 2017   related to blockage in my nose   EYE SURGERY Right 05/2019   INSERT / REPLACE / REMOVE PACEMAKER  02/15/2017   IR GASTROSTOMY TUBE MOD SED  01/01/2023   IR REPLACE G-TUBE SIMPLE WO FLUORO  12/13/2023   PACEMAKER IMPLANT N/A 02/15/2017   Procedure: Pacemaker Implant;  Surgeon: Fernande Elspeth BROCKS, MD;  Location: [facility] INVASIVE CV LAB;  Service: Cardiovascular;  Laterality: N/A;   RIGHT/LEFT HEART CATH AND CORONARY ANGIOGRAPHY N/A 11/16/2022   Procedure: RIGHT/LEFT HEART CATH AND CORONARY ANGIOGRAPHY;  Surgeon: Rolan Ezra RAMAN, MD;  Location: [facility] INVASIVE CV LAB;  Service: Cardiovascular;  Laterality: N/A;   THYROIDECTOMY N/A 04/21/2021   Procedure: TOTAL THYROIDECTOMY;  Surgeon: Rubin Calamity, MD;  Location: [facility] OR;  Service: General;  Laterality: N/A;    Family History  Problem Relation Age of Onset   Cancer Father 42       Deceased   Heart disease Father    COPD Mother    Diabetes Maternal Uncle    Heart disease Maternal Uncle    Heart disease Paternal Grandmother  Diabetes Paternal Grandmother    Hypertension Paternal Grandmother    Neuromuscular disorder Maternal Aunt        Myotonic dystrophy   Neuromuscular disorder Cousin        Myotonic dystrophy   Neuromuscular disorder Sister        Myotonic dystrophy   Colon cancer Neg Hx    Colon polyps Neg Hx    Esophageal cancer Neg Hx    Rectal cancer Neg Hx    Stomach cancer Neg Hx     Social History   Socioeconomic History   Marital status: Single    Spouse name: Not on file   Number of children: 0   Years of  education: 1.5 Collge   Highest education level: Some college, no degree  Occupational History   Occupation: Designer, industrial/product: OTHER  Tobacco Use   Smoking status: Former    Current packs/day: 0.00    Types: Cigarettes    Start date: 10/04/1988    Quit date: 10/04/2016    Years since quitting: 7.4   Smokeless tobacco: Never  Vaping Use   Vaping status: Never Used  Substance and Sexual Activity   Alcohol use: Not Currently    Alcohol/week: 0.0 standard drinks of alcohol    Comment:  few times a year   Drug use: Not Currently    Types: Marijuana    Comment: occ    Sexual activity: Not Currently    Birth control/protection: None, Condom  Other Topics Concern   Not on file  Social History Narrative   Patient lives at home with her aunt.   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  She moved to [facility] in August 2015.   She has been on disability since 2010.   Education: 1 1/2 years of college.   Caffeine - coke 2 cans/day   Exercise - no   Social Drivers of Corporate investment banker Strain: Low Risk  (01/15/2024)   Overall Financial Resource Strain (CARDIA)    Difficulty of Paying Living Expenses: Not hard at all  Food Insecurity: No Food Insecurity (03/05/2024)   Hunger Vital Sign    Worried About Running Out of Food in the Last Year: Never true    Ran Out of Food in the Last Year: Never true  Transportation Needs: No Transportation Needs (03/05/2024)   PRAPARE - Administrator, Civil Service (Medical): No    Lack of Transportation (Non-Medical): No  Physical Activity: Patient Declined (01/15/2024)   Exercise Vital Sign    Days of Exercise per Week: Patient declined    Minutes of Exercise per Session: Patient declined  Stress: No Stress Concern Present (01/15/2024)   [employer] of Occupational Health - Occupational Stress Questionnaire    Feeling of Stress : Only a little  Social Connections: Unknown (01/15/2024)   Social Connection  and Isolation Panel    Frequency of Communication with Friends and Family: More than three times a week    Frequency of Social Gatherings with Friends and Family: Twice a week    Attends Religious Services: Patient declined    Database administrator or Organizations: No    Attends Banker Meetings: Never    Marital Status: Never married    Allergies  Allergen Reactions   Penicillin G Nausea And Vomiting, Rash and Other (See Comments)    Sore throat Has had cephalosporins in past   Penicillins Nausea And Vomiting,  Rash and Other (See Comments)    Fever Has patient had a PCN reaction causing immediate rash, facial/tongue/throat swelling, SOB or lightheadedness with hypotension: Yes Has patient had a PCN reaction causing severe rash involving mucus membranes or skin necrosis: Unknown Has patient had a PCN reaction that required hospitalization: No Has patient had a PCN reaction occurring within the last 10 years: No If all of the above answers are NO, then may proceed with Cephalosporin use.     Outpatient Medications Prior to Visit  Medication Sig Dispense Refill   acetaminophen  (TYLENOL ) 500 MG tablet Place 500 mg into feeding tube every 6 (six) hours as needed for mild pain.     DULoxetine  HCl (DRIZALMA SPRINKLE ) 20 MG CSDR Open capsule and mix pellets with 50mL water , then administer via PEG. 30 capsule 5   esomeprazole  (NEXIUM ) 40 MG packet Take 40 mg by mouth daily before breakfast. 90 each 3   levothyroxine  (SYNTHROID ) 100 MCG tablet Take 1 tablet (100 mcg total) by mouth daily before breakfast. Must have office visit for refills 30 tablet 0   lidocaine  (LIDODERM ) 5 % Place 1 patch onto the skin daily. Remove & Discard patch within 12 hours or as directed by MD 30 patch 0   Misc. Devices MISC Eval & titrate pt for poc for portable oxygen  container. If patient qualifies, dispense at 1-6 pulse dose. Please fax to [phone]. Diagnosis-aspiration pneumonia 1 each 0    Misc. Devices MISC Shower chair.  Diagnosis myotonic dystrophy 1 each 0   modafinil  (PROVIGIL ) 100 MG tablet Take 1 tablet (100 mg total) by mouth daily. 30 tablet 3   Nutritional Supplements (FEEDING SUPPLEMENT, JEVITY 1.5 CAL/FIBER,) LIQD Place 237 mLs into feeding tube 4 (four) times daily. 100ml FWF before and after each feeding 1000 mL 12   spironolactone  (ALDACTONE ) 25 MG tablet PLACE 0.5 TABLETS (12.5 MG TOTAL) INTO FEEDING TUBE DAILY. 45 tablet 1   Water  For Irrigation, Sterile (FREE WATER ) SOLN 100ml free water  via tube before and after each feeding     guaifenesin  (HUMIBID E) 400 MG TABS tablet 400 mg 3 (three) times daily. Place 400mg  into feeding tube three times a day (Patient not taking: Reported on 04/02/2024)     ipratropium-albuterol  (DUONEB) 0.5-2.5 (3) MG/3ML SOLN Inhale 3 mLs into the lungs every 4 (four) hours as needed. SMARTSIG:1 Unspecified Via Inhaler Every 4 Hours PRN (Patient not taking: Reported on 04/02/2024) 360 mL 3   Iron-Vitamins (GERITOL) LIQD Take 10 mLs by mouth 2 (two) times daily. (Patient not taking: Reported on 04/02/2024) 354 mL 1   metoprolol  tartrate (LOPRESSOR ) 25 MG tablet PLACE 0.5 TABLETS (12.5 MG TOTAL) INTO FEEDING TUBE 2 (TWO) TIMES DAILY. (Patient not taking: Reported on 03/05/2024) 90 tablet 1   No facility-administered medications prior to visit.     ROS Review of Systems  Constitutional:  Negative for activity change and appetite change.  HENT:  Negative for sinus pressure and sore throat.   Respiratory:  Negative for chest tightness, shortness of breath and wheezing.   Cardiovascular:  Negative for chest pain and palpitations.  Gastrointestinal:  Negative for abdominal distention, abdominal pain and constipation.  Genitourinary: Negative.   Musculoskeletal:        See HPI  Psychiatric/Behavioral:  Negative for behavioral problems and dysphoric mood.     Objective:  BP 91/63   Pulse 87   Ht 5' 3 (1.6 m)   Wt 100 lb 6.4 oz (45.5 kg)    LMP  12/03/2016 (Approximate)   SpO2 98%   BMI 17.79 kg/m      04/02/2024    1:46 PM 04/01/2024    2:13 PM 03/27/2024    3:01 PM  BP/Weight  Systolic BP 91 92 80  Diastolic BP 63 67 61  Wt. (Lbs) 100.4    BMI 17.79 kg/m2        Physical Exam Constitutional:      Appearance: She is well-developed.  Cardiovascular:     Rate and Rhythm: Normal rate.     Heart sounds: Normal heart sounds. No murmur heard. Pulmonary:     Effort: Pulmonary effort is normal.     Breath sounds: Normal breath sounds. No wheezing or rales.  Chest:     Chest wall: No tenderness.  Abdominal:     General: Bowel sounds are normal. There is no distension.     Palpations: Abdomen is soft. There is no mass.     Tenderness: There is no abdominal tenderness.     Comments: PEG tube in place  Musculoskeletal:     Right lower leg: No edema.     Left lower leg: No edema.     Comments: Tenderness on deep palpation of bilateral thigh but greater in the left lower extremity and tenderness extends to the ankle and left to. No erythema or warmth of the left toe.  Neurological:     Mental Status: She is alert and oriented to person, place, and time.  Psychiatric:        Mood and Affect: Mood normal.        Latest Ref Rng & Units 03/22/2024    3:00 PM 02/15/2024    9:43 AM 08/09/2023    8:38 PM  CMP  Glucose 70 - 99 mg/dL 81  88  896   BUN 6 - 20 mg/dL 15  16  12    Creatinine 0.44 - 1.00 mg/dL 9.22  9.29  9.45   Sodium 135 - 145 mmol/L 143  144  141   Potassium 3.5 - 5.1 mmol/L 3.9  4.4  4.8   Chloride 98 - 111 mmol/L 104  98  104   CO2 22 - 32 mmol/L 28  24  28    Calcium  8.9 - 10.3 mg/dL 89.4  9.8  9.6   Total Protein 6.5 - 8.1 g/dL   7.5   Total Bilirubin <1.2 mg/dL   0.9   Alkaline Phos 38 - 126 U/L   51   AST 15 - 41 U/L   34   ALT 0 - 44 U/L   21     Lipid Panel     Component Value Date/Time   CHOL 214 (H) 01/10/2018 0847   TRIG 113 01/10/2018 0847   HDL 57 01/10/2018 0847   CHOLHDL 3.8  01/10/2018 0847   CHOLHDL 3.5 08/26/2014 1516   VLDL 20 08/26/2014 1516   LDLCALC 134 (H) 01/10/2018 0847    CBC    Component Value Date/Time   WBC 4.6 03/22/2024 1500   RBC 4.49 03/22/2024 1500   HGB 14.1 03/22/2024 1500   HGB 12.6 10/04/2022 1638   HCT 43.3 03/22/2024 1500   HCT 37.6 10/04/2022 1638   PLT 174 03/22/2024 1500   PLT 192 10/04/2022 1638   MCV 96.4 03/22/2024 1500   MCV 94 10/04/2022 1638   MCH 31.4 03/22/2024 1500   MCHC 32.6 03/22/2024 1500   RDW 14.2 03/22/2024 1500   RDW 15.1 10/04/2022 1638   LYMPHSABS  1.9 08/09/2023 2038   LYMPHSABS 2.1 10/04/2022 1638   MONOABS 0.4 08/09/2023 2038   EOSABS 0.1 08/09/2023 2038   EOSABS 0.1 10/04/2022 1638   BASOSABS 0.0 08/09/2023 2038   BASOSABS 0.0 10/04/2022 1638    Lab Results  Component Value Date   HGBA1C 5.6 11/15/2022    Lab Results  Component Value Date   TSH CANCELED 09/04/2023       Assessment & Plan Pain in both lower extremities in the setting of myotonic dystrophy Chronic pain in both lower extremities, more severe in the left leg, with no vascular abnormalities. Differential includes myotonic dystrophy-related pain, bone issues, or blood clots. No infection or electrolyte imbalance. Insurance issues with Cymbalta . - Order x-ray of both femurs to assess for bone abnormalities. - Order ultrasound of the left leg to rule out blood clots. - Refer to pain management clinic for further evaluation and management. - Follow up on Cymbalta  prescription with the oncologist and pharmacy. - Communicate x-ray results via MyChart once available.  Hypothyroidism Hypothyroidism management requires monitoring thyroid  function tests. Recent test results from [facility] did not include a TSH after we called them to verify this. - I will have her stop by [facility] again to get thyroid  function panel as she is a hard stick and it is difficult to obtain her labs here in the clinic.     No orders of the defined types  were placed in this encounter.   Follow-up: Return in about 6 months (around 10/03/2024) for cancel previous appointment, Chronic medical conditions.       Corrina Sabin, MD, FAAFP. [facility] [place], [place] [phone]   04/02/2024, 3:09 PM

## 2024-04-02 NOTE — Patient Instructions (Signed)
 VISIT SUMMARY:  Today, we discussed your ongoing bilateral leg pain, particularly in the left leg, and your hypothyroidism management. We reviewed your recent hospital visit and the results of your CT angiogram, which showed no abnormalities. We also addressed the issues with your Cymbalta  prescription and planned further diagnostic tests and referrals to better manage your pain.  YOUR PLAN:  -PAIN IN BOTH LOWER EXTREMITIES: Your chronic leg pain, especially in the left leg, may be related to your myotonic dystrophy, bone issues, or blood clots. We will perform an x-ray of both femurs to check for bone problems and an ultrasound of your left leg to rule out blood clots. You will also be referred to a pain management clinic for further evaluation and treatment. We will follow up on your Cymbalta  prescription with your oncologist and pharmacy. Once the x-ray results are available, we will communicate them to you via MyChart.  -HYPOTHYROIDISM: Hypothyroidism is a condition where your thyroid  gland does not produce enough thyroid  hormone. We need to monitor your thyroid  function tests to ensure your levothyroxine  dosage is correct. We will contact [facility] to obtain your recent test results and adjust your medication if necessary. Your follow-up appointment on August 26th has been canceled as it is no longer needed.  INSTRUCTIONS:  Please complete the x-ray of both femurs and the ultrasound of your left leg as soon as possible. Follow up with the pain management clinic for further evaluation. We will update you on your Cymbalta  prescription and thyroid  function test results once we have more information.

## 2024-04-04 ENCOUNTER — Ambulatory Visit: Attending: Neurology | Admitting: Physical Therapy

## 2024-04-04 VITALS — BP 89/65 | HR 86

## 2024-04-04 DIAGNOSIS — Z9181 History of falling: Secondary | ICD-10-CM | POA: Diagnosis not present

## 2024-04-04 DIAGNOSIS — R2681 Unsteadiness on feet: Secondary | ICD-10-CM | POA: Diagnosis not present

## 2024-04-04 DIAGNOSIS — M79605 Pain in left leg: Secondary | ICD-10-CM | POA: Diagnosis not present

## 2024-04-04 DIAGNOSIS — M79604 Pain in right leg: Secondary | ICD-10-CM | POA: Insufficient documentation

## 2024-04-04 DIAGNOSIS — M6281 Muscle weakness (generalized): Secondary | ICD-10-CM | POA: Diagnosis not present

## 2024-04-04 DIAGNOSIS — R2689 Other abnormalities of gait and mobility: Secondary | ICD-10-CM | POA: Insufficient documentation

## 2024-04-04 DIAGNOSIS — R29898 Other symptoms and signs involving the musculoskeletal system: Secondary | ICD-10-CM | POA: Insufficient documentation

## 2024-04-04 NOTE — Therapy (Signed)
 OUTPATIENT PHYSICAL THERAPY NEURO TREATMENT   Patient Name: Cynthia Underwood MRN: [id number] DOB:[birth year], 53 y.o., female Today's Date: 04/04/2024   PCP: Delbert Clam, MD REFERRING PROVIDER: Tobie Tonita POUR, DO  END OF SESSION:  PT End of Session - 04/04/24 1314     Visit Number 7    Number of Visits 9   with eval   Date for PT Re-Evaluation 04/21/24    Authorization Type [employer] Dual    PT Start Time 1312    PT Stop Time 1353    PT Time Calculation (min) 41 min    Activity Tolerance Patient limited by pain    Behavior During Therapy [facility] for tasks assessed/performed              Past Medical History:  Diagnosis Date   Bifascicular block 10/02/2014   Cataract    Dyspnea    Excess ear wax    Multinodular goiter    Pneumonia    Presence of permanent cardiac pacemaker    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   Sickle cell trait (HCC)    Watery eyes    Left   Weakness of both legs    Past Surgical History:  Procedure Laterality Date   BREAST BIOPSY Right    ESOPHAGOGASTRODUODENOSCOPY N/A 02/19/2024   Procedure: EGD (ESOPHAGOGASTRODUODENOSCOPY);  Surgeon: Nandigam, Kavitha V, MD;  Location: [facility];  Service: Gastroenterology;  Laterality: N/A;   EYE SURGERY Left 2017   related to blockage in my nose   EYE SURGERY Right 05/2019   INSERT / REPLACE / REMOVE PACEMAKER  02/15/2017   IR GASTROSTOMY TUBE MOD SED  01/01/2023   IR REPLACE G-TUBE SIMPLE WO FLUORO  12/13/2023   PACEMAKER IMPLANT N/A 02/15/2017   Procedure: Pacemaker Implant;  Surgeon: Fernande Elspeth BROCKS, MD;  Location: [facility] INVASIVE CV LAB;  Service: Cardiovascular;  Laterality: N/A;   RIGHT/LEFT HEART CATH AND CORONARY ANGIOGRAPHY N/A 11/16/2022   Procedure: RIGHT/LEFT HEART CATH AND CORONARY ANGIOGRAPHY;  Surgeon: Rolan Ezra RAMAN, MD;  Location: [facility] INVASIVE CV LAB;  Service: Cardiovascular;  Laterality: N/A;   THYROIDECTOMY N/A 04/21/2021   Procedure: TOTAL  THYROIDECTOMY;  Surgeon: Rubin Calamity, MD;  Location: [facility] OR;  Service: General;  Laterality: N/A;   Patient Active Problem List   Diagnosis Date Noted   Pain of upper abdomen 02/19/2024   Duodenal nodule 02/19/2024   Aspiration pneumonia (HCC) 05/21/2023   Gastrointestinal tube in situ (HCC) 03/13/2023   Diarrhea 01/23/2023   SIRS (systemic inflammatory response syndrome) (HCC) 01/23/2023   Chronic respiratory failure with hypoxia (HCC) 01/23/2023   Dysphagia 01/23/2023   Chronic systolic CHF (congestive heart failure) (HCC) 01/23/2023   Acute on chronic respiratory failure with hypoxia (HCC) 12/07/2022   Tracheostomy status (HCC) 12/07/2022   Generalized anxiety disorder 12/06/2022   Acute systolic heart failure (HCC) 11/26/2022   Drug-induced torsades de pointes (HCC) 11/17/2022   Acute systolic CHF (congestive heart failure) (HCC) 11/16/2022   Acute hypoxemic respiratory failure (HCC) 11/15/2022   Acute on chronic combined systolic and diastolic CHF (congestive heart failure) (HCC) 11/15/2022   Protein-calorie malnutrition, severe 11/15/2022   Cardiac arrest (HCC) 11/14/2022   Healthcare-associated pneumonia 11/11/2022   Hypothyroidism 11/07/2021   Thyroid  nodule 04/21/2021   S/P total thyroidectomy 04/21/2021   Sinus node dysfunction (HCC) 03/10/2020   Weakness of both legs    Watery eyes    Sickle cell trait (HCC)    Presence of permanent cardiac  pacemaker    Excess ear wax    Depression    Anemia    Cardiac pacemaker in situ 03/01/2017   Mobitz type 2 second degree heart block 02/15/2017   Symptomatic advanced heart block 02/12/2017   SOB (shortness of breath) 10/02/2014   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   Bifascicular block 10/02/2014   Infection due to trichomonas vaginalis 09/15/2014   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    ONSET DATE: 03/03/2024 (referral date)  REFERRING DIAG: G71.11 (ICD-10-CM) - Myotonic  dystrophy, type 1 (HCC) R53.83 (ICD-10-CM) - Other fatigue M62.81 (ICD-10-CM) - Muscle weakness (generalized)  THERAPY DIAG:  Muscle weakness (generalized)  Unsteadiness on feet  Other abnormalities of gait and mobility  Pain in right leg  Pain in left leg  Other symptoms and signs involving the musculoskeletal system  Rationale for Evaluation and Treatment: Rehabilitation  SUBJECTIVE:                                                                                                                                                                                             SUBJECTIVE STATEMENT:  Pt enters clinic with her rollator, not wearing her O2.  Pt reports she is not doing well today because of her left leg pain that continues. She did have a follow-up with her PCP Dr. Delbert yesterday to address her ongoing LLE pain. She received a referral to pain management and is scheduled to see them 05/06/24 for a consultation and Dr. Newlin also ordered an US  and xray of her LE as well as a blood draw. Pt scheduled for US  next week, not yet scheduled for other testing.  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  Over the years, she developed worsening stiffness of her fingers and hands.  she ultimately was genetically tested at the age of 18 which confirmed the diagnosis of myotonic dystrophy type 1. She has a strong family history of DM1 including maternal aunt, cousins x 2, and younger sister.  She does not have any children and has no future plans for pregnancy.   Symptoms were relatively stable until her mid-30s and then started developing  fatigue, weakness, daytime sleepiness, and shortness of breath with exertion.  She walks independently but was told previously told to  use leg braces.  Over the past few years, she noticed intermittent difficulty swallowing liquids. She underwent barium swallow which showed signs of aspiration and she was recommended to use a straw and use chin tuck position.     She was seeing several neurologists over the years at [facility] in [place] .  She is not working and has been on disability since 2010.  PAIN:  Are you having pain? Yes: NPRS scale: 4/10 Pain location: R hip/groin area Pain description: achy pain Aggravating factors: trying to lift leg up, sometimes bothers her when walking, sitting down for extended period of time Relieving factors: Tylenol  if she can't stand the pain  Are you having pain? Yes: NPRS scale: 10/10 Pain location: LLE but especially her L foot Pain description: it just hurts Aggravating factors: wakes her up because it is hurting Relieving factors: Tylenol  every 4-6 hours  PRECAUTIONS: Fall, ICD/Pacemaker, and Other: PEG tube, supplemental O2  RED FLAGS: None   WEIGHT BEARING RESTRICTIONS: No  FALLS: Has patient fallen in last 6 months? No  LIVING ENVIRONMENT: Lives with: lives with their family Lives in: House/apartment Stairs: No (one level home) Has following equipment at home: Walker - 4 wheeled  PLOF: Independent with gait, Independent with transfers, and Requires assistive device for independence  PATIENT GOALS: stop the pain  OBJECTIVE:  Note: Objective measures were completed at Evaluation unless otherwise noted.  DIAGNOSTIC FINDINGS: None updated or relevant to this POC  COGNITION: Overall cognitive status: Within functional limits for tasks assessed   SENSATION: Decreased light touch in RLE  COORDINATION: Not assessed  EDEMA:  None  POSTURE: rounded shoulders, forward head, and posterior pelvic tilt  PALPATION:  tenderness to palpation at R anterior hip region and down into R quads; pain with hip IR, with compression through joint, with hip flexion overpressure, mild pain with R hip distraction  LOWER EXTREMITY ROM:     Passive  Right Eval Left Eval  Hip flexion Pain with overpressure   Hip extension    Hip abduction    Hip adduction    Hip internal rotation Pain with PROM   Hip external rotation    Knee flexion    Knee extension    Ankle dorsiflexion    Ankle plantarflexion    Ankle inversion    Ankle eversion     (Blank rows = not tested)  LOWER EXTREMITY MMT:    MMT Right Eval Left Eval  Hip flexion Not tested due to pain 3-  Hip extension    Hip abduction    Hip adduction    Hip internal rotation    Hip external rotation    Knee flexion 3- 3-  Knee extension 3- 3-  Ankle dorsiflexion 3 3  Ankle plantarflexion    Ankle inversion    Ankle eversion    (Blank rows = not tested)  BED MOBILITY:  Mod I per pt report, sleeps in regular bed, no bedrails  TRANSFERS: Sit to stand: Modified independence  Assistive device utilized: Environmental consultant - 4 wheeled     Stand to sit: Modified independence  Assistive device utilized: Environmental consultant - 4 wheeled     Chair to chair: Modified independence  Assistive device utilized: Environmental consultant - 4 wheeled       RAMP:  Not tested  CURB:  Not tested  STAIRS: Not tested GAIT: Findings:  Gait pattern: decreased hip/knee flexion- Right, decreased hip/knee flexion- Left, and trunk flexed Distance walked: various clinic distances Assistive device utilized:  Walker - 4 wheeled Level of assistance: Modified independence Comments: see above   FUNCTIONAL TESTS:       VITALS Vitals:   04/04/24 1325 04/04/24 1414  BP: (!) 81/60 (!) 89/65  Pulse: 78 86                                                                                                                                   TREATMENT DATE:   Self-Care/Home Management/Ther Act  Assessed vitals  in LUE in sitting prior to intervention, BP remains low. 81/60, HR 78 SciFit multi-peaks level 5.0 for 8 minutes using BUE/BLEs for neural priming for reciprocal movement, dynamic cardiovascular warmup and increased amplitude of stepping. Reassessed vitals following warm-up on SciFit: 89/65, HR 86 To address ongoing LE pain and muscle tightness: Supine hip add squeeze 2 x 10 reps with 5 sec hold Supine SKFO x 10 reps B Supine heel slides x 10 reps B Discussed PT POC with plan to d/c after 2 visits next week, pt to follow-up with pain management and reach out to her cardiologist to schedule   PATIENT EDUCATION: Education details: continue HEP as tolerated, d/c plan Person educated: Patient Education method: Medical illustrator Education comprehension: verbalized understanding, returned demonstration, and needs further education  HOME EXERCISE PROGRAM: Access Code: [email or web address] URL: [email or web address] Date: 03/14/2024 Prepared by: Marlon Plaster  Exercises - Supine Butterfly Groin Stretch  - 1 x daily - 7 x weekly - 10 reps - 10-15 seconds hold - Supine Hip Adduction Isometric with Ball  - 1 x daily - 7 x weekly - 1-2 sets - 15-20 reps - 3-5 seconds hold - Supine Lower Trunk Rotation  - 1 x daily - 7 x weekly - 3 sets - 10 reps - Clamshell  - 1 x daily - 7 x weekly - 3 sets - 10 reps - Seated Hamstring Stretch with Strap  - 1 x daily - 7 x weekly - 1 sets - 3-5 reps - 30 sec hold - Modified Thomas Stretch  - 1 x daily - 7 x weekly - 1 sets - 3-5 reps - 30 sec hold  GOALS: Goals reviewed with patient? Yes  SHORT TERM GOALS=LONG TERM GOALS due to length of POC   LONG TERM GOALS: Target date: 04/10/2024   Pt will be independent with final HEP for improved strength, balance, transfers and gait as well as for independence with management of pain symptoms. Baseline:  Goal status: INITIAL  2.  Pt will improve gait velocity to at least 2.5 ft/sec for improved  gait efficiency and performance at mod I level  Baseline: 2.3 ft/sec mod I with rollator (7/7) Goal status: INITIAL  3.  Pt will report </= 4/10 pain in her RLE at the most to demonstrate improved function and decreased pain level. Baseline: 6/10 (7/7) Goal status: INITIAL    ASSESSMENT:  CLINICAL IMPRESSION: Emphasis of skilled PT session on continuing  to monitor BP, working on global endurance training, and working on stretching and strengthening of LE to address ongoing pain and muscle tightness. Pt with ongoing low BP that does improve with exercise this session. She continues to exhibit ongoing LE pain, has an appointment with pain management in early September. She continues to benefit from skilled PT services to work towards increased independence with management of her pain symptoms. Plan to d/c at end of this POC, dicussed this with patient today. Continue POC.    OBJECTIVE IMPAIRMENTS: cardiopulmonary status limiting activity, decreased activity tolerance, decreased balance, decreased endurance, decreased knowledge of condition, decreased ROM, decreased strength, improper body mechanics, postural dysfunction, and pain.   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   REHAB POTENTIAL: Fair chronic nature of her myotonic dystrophy type 1, has been seen for PT for multiple POC previously  CLINICAL DECISION MAKING: Stable/uncomplicated  EVALUATION COMPLEXITY: Low  PLAN:  PT FREQUENCY: 2x/week  PT DURATION: 4  weeks  PLANNED INTERVENTIONS: 97164- PT Re-evaluation, 97750- Physical Performance Testing, 97110-Therapeutic exercises, 97530- Therapeutic activity, [id number]- Neuromuscular re-education, 97535- Self Care, [id number]- Manual therapy, [id number]- Gait training, [phone]- Aquatic Therapy, [phone]- Electrical stimulation (manual), [phone] (1-2 muscles), [id number] (3+ muscles)- Dry Needling, Patient/Family education, Balance training, Stair training, Taping, Joint mobilization, Spinal mobilization, DME instructions, Cryotherapy, and Moist heat  PLAN FOR NEXT SESSION: Check BP. Review previous HEP/initiate new HEP to address impaired endurance and R hip/LE pain (R hip stretches and strengthening), R hip adductor mobility, R glute med strength, SciFit level 5   Waddell Southgate, PT Waddell Southgate, PT, DPT, CSRS   04/04/2024, 2:14 PM

## 2024-04-05 ENCOUNTER — Other Ambulatory Visit: Payer: Self-pay | Admitting: Cardiology

## 2024-04-07 ENCOUNTER — Ambulatory Visit: Admitting: Physical Therapy

## 2024-04-07 VITALS — BP 82/54 | HR 72

## 2024-04-07 DIAGNOSIS — R2681 Unsteadiness on feet: Secondary | ICD-10-CM

## 2024-04-07 DIAGNOSIS — M6281 Muscle weakness (generalized): Secondary | ICD-10-CM | POA: Diagnosis not present

## 2024-04-07 DIAGNOSIS — M79605 Pain in left leg: Secondary | ICD-10-CM

## 2024-04-07 DIAGNOSIS — R2689 Other abnormalities of gait and mobility: Secondary | ICD-10-CM

## 2024-04-07 DIAGNOSIS — R29898 Other symptoms and signs involving the musculoskeletal system: Secondary | ICD-10-CM | POA: Diagnosis not present

## 2024-04-07 DIAGNOSIS — M79604 Pain in right leg: Secondary | ICD-10-CM

## 2024-04-07 DIAGNOSIS — Z9181 History of falling: Secondary | ICD-10-CM | POA: Diagnosis not present

## 2024-04-07 NOTE — Therapy (Signed)
 OUTPATIENT PHYSICAL THERAPY NEURO TREATMENT   Patient Name: Cynthia Underwood MRN: [id number] DOB:14-Oct-1970, 53 y.o., female Today's Date: 04/07/2024   PCP: Delbert Clam, MD REFERRING PROVIDER: Tobie Tonita POUR, DO  END OF SESSION:  PT End of Session - 04/07/24 1511     Visit Number 8    Number of Visits 9   with eval   Date for PT Re-Evaluation 04/21/24    Authorization Type [employer] Dual    PT Start Time 1510    PT Stop Time 1607    PT Time Calculation (min) 57 min    Activity Tolerance Patient tolerated treatment well    Behavior During Therapy WFL for tasks assessed/performed             Past Medical History:  Diagnosis Date   Bifascicular block 10/02/2014   Cataract    Dyspnea    Excess ear wax    Multinodular goiter    Pneumonia    Presence of permanent cardiac pacemaker    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   Sickle cell trait (HCC)    Watery eyes    Left   Weakness of both legs    Past Surgical History:  Procedure Laterality Date   BREAST BIOPSY Right    ESOPHAGOGASTRODUODENOSCOPY N/A 02/19/2024   Procedure: EGD (ESOPHAGOGASTRODUODENOSCOPY);  Surgeon: Nandigam, Kavitha V, MD;  Location: [facility];  Service: Gastroenterology;  Laterality: N/A;   EYE SURGERY Left 2017   related to blockage in my nose   EYE SURGERY Right 05/2019   INSERT / REPLACE / REMOVE PACEMAKER  02/15/2017   IR GASTROSTOMY TUBE MOD SED  01/01/2023   IR REPLACE G-TUBE SIMPLE WO FLUORO  12/13/2023   PACEMAKER IMPLANT N/A 02/15/2017   Procedure: Pacemaker Implant;  Surgeon: Fernande Elspeth BROCKS, MD;  Location: [facility] INVASIVE CV LAB;  Service: Cardiovascular;  Laterality: N/A;   RIGHT/LEFT HEART CATH AND CORONARY ANGIOGRAPHY N/A 11/16/2022   Procedure: RIGHT/LEFT HEART CATH AND CORONARY ANGIOGRAPHY;  Surgeon: Rolan Ezra RAMAN, MD;  Location: [facility] INVASIVE CV LAB;  Service: Cardiovascular;  Laterality: N/A;   THYROIDECTOMY N/A 04/21/2021   Procedure:  TOTAL THYROIDECTOMY;  Surgeon: Rubin Calamity, MD;  Location: [facility] [place] [facility];  Service: General;  Laterality: N/A;   Patient Active Problem List   Diagnosis Date Noted   Pain of upper abdomen 02/19/2024   Duodenal nodule 02/19/2024   Aspiration pneumonia (HCC) 05/21/2023   Gastrointestinal tube in situ (HCC) 03/13/2023   Diarrhea 01/23/2023   SIRS (systemic inflammatory response syndrome) (HCC) 01/23/2023   Chronic respiratory failure with hypoxia (HCC) 01/23/2023   Dysphagia 01/23/2023   Chronic systolic CHF (congestive heart failure) (HCC) 01/23/2023   Acute on chronic respiratory failure with hypoxia (HCC) 12/07/2022   Tracheostomy status (HCC) 12/07/2022   Generalized anxiety disorder 12/06/2022   Acute systolic heart failure (HCC) 11/26/2022   Drug-induced torsades de pointes (HCC) 11/17/2022   Acute systolic CHF (congestive heart failure) (HCC) 11/16/2022   Acute hypoxemic respiratory failure (HCC) 11/15/2022   Acute on chronic combined systolic and diastolic CHF (congestive heart failure) (HCC) 11/15/2022   Protein-calorie malnutrition, severe 11/15/2022   Cardiac arrest (HCC) 11/14/2022   Healthcare-associated pneumonia 11/11/2022   Hypothyroidism 11/07/2021   Thyroid  nodule 04/21/2021   S/P total thyroidectomy 04/21/2021   Sinus node dysfunction (HCC) 03/10/2020   Weakness of both legs    Watery eyes    Sickle cell trait (HCC)    Presence of permanent cardiac pacemaker  Excess ear wax    Depression    Anemia    Cardiac pacemaker in situ 03/01/2017   Mobitz type 2 second degree heart block 02/15/2017   Symptomatic advanced heart block 02/12/2017   SOB (shortness of breath) 10/02/2014   RBBB (right bundle branch block with left anterior fascicular block) 10/02/2014   Bifascicular block 10/02/2014   Infection due to trichomonas vaginalis 09/15/2014   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    ONSET DATE: 03/03/2024 (referral date)  REFERRING DIAG: G71.11 (ICD-10-CM) -  Myotonic dystrophy, type 1 (HCC) R53.83 (ICD-10-CM) - Other fatigue M62.81 (ICD-10-CM) - Muscle weakness (generalized)  THERAPY DIAG:  Muscle weakness (generalized)  Unsteadiness on feet  Other abnormalities of gait and mobility  Pain in right leg  Pain in left leg  Rationale for Evaluation and Treatment: Rehabilitation  SUBJECTIVE:                                                                                                                                                                                             SUBJECTIVE STATEMENT:  Pt enters clinic with her rollator, not wearing her O2.  Pt reports she is doing better today. Has an ultrasound tomorrow to rule out DVT in LLE and has bilateral LE x-rays scheduled on 9/2 to look at femurs.  Pain in legs is a bit better today but still bothersome.    Pt accompanied by: self, pt takes transportation to her appointments (does not drive)  PERTINENT HISTORY: PMH: myotonic dystrophy type I complicated by cardiac arryhtmia s/p PPM (June 2018), dysphagia s/p PEG, and dyspnea on supplemental oxygen  via nasal canula and hyperlipidemia  History of current illness: Starting around the age of 61, she started having lock jaw and stiffness of the hands.  Over the years, she developed worsening stiffness of her fingers and hands.  she ultimately was genetically tested at the age of 53 which confirmed the diagnosis of myotonic dystrophy type 1. She has a strong family history of DM1 including maternal aunt, cousins x 2, and younger sister.  She does not have any children and has no future plans for pregnancy.   Symptoms were relatively stable until her mid-30s and then started developing fatigue, weakness, daytime sleepiness, and shortness of breath with exertion.  She walks independently but was told previously told to use leg braces.  Over the past few years, she noticed intermittent difficulty swallowing liquids. She underwent barium swallow  which showed signs of aspiration and she was recommended to use a straw and use chin tuck position.     She was seeing several neurologists over the years at  [facility] in [place] .  She is not working and has been on disability since 2010.  PAIN:  Are you having pain? Yes: NPRS scale: 5/10 Pain location: R hip/groin area, BLEs Pain description: achy pain Aggravating factors: trying to lift leg up, sometimes bothers her when walking, sitting down for extended period of time Relieving factors: Tylenol  if she can't stand the pain   PRECAUTIONS: Fall, ICD/Pacemaker, and Other: PEG tube, supplemental O2  RED FLAGS: None   WEIGHT BEARING RESTRICTIONS: No  FALLS: Has patient fallen in last 6 months? No  LIVING ENVIRONMENT: Lives with: lives with their family Lives in: House/apartment Stairs: No (one level home) Has following equipment at home: Walker - 4 wheeled  PLOF: Independent with gait, Independent with transfers, and Requires assistive device for independence  PATIENT GOALS: stop the pain  OBJECTIVE:  Note: Objective measures were completed at Evaluation unless otherwise noted.  DIAGNOSTIC FINDINGS: None updated or relevant to this POC  COGNITION: Overall cognitive status: Within functional limits for tasks assessed   SENSATION: Decreased light touch in RLE  COORDINATION: Not assessed  EDEMA:  None  POSTURE: rounded shoulders, forward head, and posterior pelvic tilt  PALPATION: tenderness to palpation at R anterior hip region and down into R quads; pain with hip IR, with compression through joint, with hip flexion overpressure, mild pain with R hip distraction  LOWER EXTREMITY ROM:     Passive  Right Eval Left Eval  Hip flexion Pain with overpressure   Hip extension    Hip abduction    Hip adduction    Hip internal rotation Pain with PROM   Hip external rotation    Knee flexion    Knee extension    Ankle dorsiflexion    Ankle plantarflexion     Ankle inversion    Ankle eversion     (Blank rows = not tested)  LOWER EXTREMITY MMT:    MMT Right Eval Left Eval  Hip flexion Not tested due to pain 3-  Hip extension    Hip abduction    Hip adduction    Hip internal rotation    Hip external rotation    Knee flexion 3- 3-  Knee extension 3- 3-  Ankle dorsiflexion 3 3  Ankle plantarflexion    Ankle inversion    Ankle eversion    (Blank rows = not tested)  BED MOBILITY:  Mod I per pt report, sleeps in regular bed, no bedrails  TRANSFERS: Sit to stand: Modified independence  Assistive device utilized: Environmental consultant - 4 wheeled     Stand to sit: Modified independence  Assistive device utilized: Environmental consultant - 4 wheeled     Chair to chair: Modified independence  Assistive device utilized: Environmental consultant - 4 wheeled       RAMP:  Not tested  CURB:  Not tested  STAIRS: Not tested GAIT: Findings:  Gait pattern: decreased hip/knee flexion- Right, decreased hip/knee flexion- Left, and trunk flexed Distance walked: various clinic distances Assistive device utilized: Walker - 4 wheeled Level of assistance: Modified independence Comments: see above   FUNCTIONAL TESTS:       VITALS Vitals:   04/07/24 1531 04/07/24 1557 04/07/24 1558 04/07/24 1602  BP: (!) 84/54 Comment: Following SciFit (!) 99/59 (!) 76/54 (!) 82/54 Comment: End of session  Pulse: 80 83 77 72  TREATMENT DATE:   Self-Care/Home Management/Ther Act  Assessed vitals in LUE in sitting prior to intervention, BP remains low (see above). Pt denies lightheadedness or dizziness but reports being very fatigued today. Had to get up early for another MD appointment.  SciFit multi-peaks level 7.5 for 8 minutes using BUE/BLEs for neural priming for reciprocal movement, dynamic cardiovascular warmup and increased amplitude of stepping. Reassessed vitals  following warm-up on SciFit (see above) and BP not improved from initial reading.  At ballet bar, glute med kickbacks w/yellow resistance band around ankles, x12 reps per side for improved glute med strength and single leg stability. Min cues for proper technique to facilitate glute med > max. Added to HEP (see bolded below). Also added movement without resistance to HEP if resistance is too much.  Quadruped adductor rocks, x10 reps, for improved hip adductor stretch. Added to HEP (see bolded below). Educated pt on modifying quadruped position by using pillows to prop on elbows as quadruped is painful for her.  Modified quadruped glute kickbacks (on forearms), x8 reps per side for improved posterior chain strength.  Single leg glute bridges w/LE elevated on green dynadisc, x6 reps per side, for improved single leg stability and posterior chain strength.  Assessed vitals at end of session in supine (99/59 mmHg) and seated (76/54 mmHg) and pt denied symptoms of orthostatics. After several minutes seated, assessed vitals again and BP back to baseline (82/54 mmHg).     PATIENT EDUCATION: Education details: Updates to HEP Person educated: Patient Education method: Programmer, multimedia, Facilities manager, Verbal cues, and Handouts Education comprehension: verbalized understanding, returned demonstration, verbal cues required, and needs further education  HOME EXERCISE PROGRAM: Access Code: [email or web address] URL: [email or web address] Date: 03/14/2024 Prepared by: Marlon Koty Anctil  Exercises - Supine Butterfly Groin Stretch  - 1 x daily - 7 x weekly - 10 reps - 10-15 seconds hold - Supine Hip Adduction Isometric with Ball  - 1 x daily - 7 x weekly - 1-2 sets - 15-20 reps - 3-5 seconds hold - Supine Lower Trunk Rotation  - 1 x daily - 7 x weekly - 3 sets - 10 reps - Clamshell  - 1 x daily - 7 x weekly - 3 sets - 10 reps - Seated Hamstring Stretch with Strap  - 1 x daily - 7 x weekly - 1 sets - 3-5 reps - 30  sec hold - Modified Thomas Stretch  - 1 x daily - 7 x weekly - 1 sets - 3-5 reps - 30 sec hold - Diagonal Hip Extension with Resistance  - 1 x daily - 7 x weekly - 3 sets - 10 reps - Diagonal Hip Extension  - 1 x daily - 7 x weekly - 3 sets - 10 reps - Quadruped Adductor Stretch  - 1 x daily - 7 x weekly - 3 sets - 10 reps  GOALS: Goals reviewed with patient? Yes  SHORT TERM GOALS=LONG TERM GOALS due to length of POC   LONG TERM GOALS: Target date: 04/10/2024   Pt will be independent with final HEP for improved strength, balance, transfers and gait as well as for independence with management of pain symptoms. Baseline:  Goal status: INITIAL  2.  Pt will improve gait velocity to at least 2.5 ft/sec for improved gait efficiency and performance at mod I level  Baseline: 2.3 ft/sec mod I with rollator (7/7) Goal status: INITIAL  3.  Pt will report </= 4/10 pain in her RLE at the most to demonstrate improved  function and decreased pain level. Baseline: 6/10 (7/7) Goal status: INITIAL    ASSESSMENT:  CLINICAL IMPRESSION: Emphasis of skilled PT session on continuing to monitor BP, posterior chain strengthening and mobility. Pt continues to be limited by low BP and elevated pain levels but tolerated session well w/short rest breaks. Noted drop in BP when pt transitioned from supine > sitting, but BP did recover after several minutes of seated rest. Added to HEP to work on hip adductor mobility and glute med strength today, both of which were challenging for pt. Continue POC.    OBJECTIVE IMPAIRMENTS: cardiopulmonary status limiting activity, decreased activity tolerance, decreased balance, decreased endurance, decreased knowledge of condition, decreased ROM, decreased strength, improper body mechanics, postural dysfunction, and pain.   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   REHAB POTENTIAL: Fair chronic nature of her myotonic dystrophy type 1, has been seen for PT for multiple POC previously  CLINICAL DECISION MAKING: Stable/uncomplicated  EVALUATION COMPLEXITY: Low  PLAN:  PT FREQUENCY: 2x/week  PT DURATION: 4 weeks  PLANNED INTERVENTIONS: [id number]- PT Re-evaluation, 97750- Physical Performance Testing, 97110-Therapeutic exercises, 97530- Therapeutic activity, [id number]- Neuromuscular re-education, 97535- Self Care, [id number]- Manual therapy, [id number]- Gait training, [phone]- Aquatic Therapy, [phone]- Electrical stimulation (manual), [phone] (1-2 muscles), [id number] (3+ muscles)- Dry Needling, Patient/Family education, Balance training, Stair training, Taping, Joint mobilization, Spinal mobilization, DME instructions, Cryotherapy, and Moist heat  PLAN FOR NEXT SESSION: Check BP. Goals and DC    Tanylah Schnoebelen E Jeiry Birnbaum, PT, DPT   04/07/2024, 4:15 PM

## 2024-04-08 ENCOUNTER — Ambulatory Visit: Payer: Self-pay | Admitting: Family Medicine

## 2024-04-08 ENCOUNTER — Encounter: Payer: Self-pay | Admitting: Neurology

## 2024-04-08 ENCOUNTER — Ambulatory Visit (HOSPITAL_COMMUNITY)
Admission: RE | Admit: 2024-04-08 | Discharge: 2024-04-08 | Disposition: A | Discharge status: Home / Self Care | Source: Ambulatory Visit | Attending: Family Medicine | Admitting: Family Medicine

## 2024-04-08 DIAGNOSIS — M79604 Pain in right leg: Secondary | ICD-10-CM | POA: Diagnosis not present

## 2024-04-08 DIAGNOSIS — M79605 Pain in left leg: Secondary | ICD-10-CM | POA: Insufficient documentation

## 2024-04-09 ENCOUNTER — Telehealth

## 2024-04-10 ENCOUNTER — Ambulatory Visit: Admitting: Physical Therapy

## 2024-04-10 VITALS — BP 85/59 | HR 73

## 2024-04-10 DIAGNOSIS — M79604 Pain in right leg: Secondary | ICD-10-CM | POA: Diagnosis not present

## 2024-04-10 DIAGNOSIS — R2689 Other abnormalities of gait and mobility: Secondary | ICD-10-CM

## 2024-04-10 DIAGNOSIS — Z9181 History of falling: Secondary | ICD-10-CM | POA: Diagnosis not present

## 2024-04-10 DIAGNOSIS — R2681 Unsteadiness on feet: Secondary | ICD-10-CM | POA: Diagnosis not present

## 2024-04-10 DIAGNOSIS — M6281 Muscle weakness (generalized): Secondary | ICD-10-CM

## 2024-04-10 DIAGNOSIS — M79605 Pain in left leg: Secondary | ICD-10-CM | POA: Diagnosis not present

## 2024-04-10 DIAGNOSIS — R29898 Other symptoms and signs involving the musculoskeletal system: Secondary | ICD-10-CM | POA: Diagnosis not present

## 2024-04-10 NOTE — Therapy (Signed)
 OUTPATIENT PHYSICAL THERAPY NEURO TREATMENT - DISCHARGE NOTE   Patient Name: Cynthia Underwood MRN: [id number] DOB:December 13, 1970, 53 y.o., female Today's Date: 04/10/2024   PCP: Delbert Clam, MD REFERRING PROVIDER: Patel, Donika K, DO  PHYSICAL THERAPY DISCHARGE SUMMARY  Visits from Start of Care: 9  Current functional level related to goals / functional outcomes: Mod I with rollator   Remaining deficits: Ongoing chronic pain, impaired endurance, impaired strength, and impaired balance in the context of myotonic dystrophy type 1   Education / Equipment: Handout for final HEP, continue to use rollator   Patient agrees to discharge. Patient goals were partially met. Patient is being discharged due to maximized rehab potential.    END OF SESSION:  PT End of Session - 04/10/24 1452     Visit Number 9    Number of Visits 9   with eval   Date for PT Re-Evaluation 04/21/24    Authorization Type UHC Dual    PT Start Time 1450    PT Stop Time 1520   d/c   PT Time Calculation (min) 30 min    Equipment Utilized During Treatment Gait belt    Activity Tolerance Patient tolerated treatment well    Behavior During Therapy WFL for tasks assessed/performed              Past Medical History:  Diagnosis Date   Bifascicular block 10/02/2014   Cataract    Dyspnea    Excess ear wax    Multinodular goiter    Pneumonia    Presence of permanent cardiac pacemaker    RBBB (right bundle branch block with left anterior fascicular block) 10/02/2014   Sickle cell trait (HCC)    Watery eyes    Left   Weakness of both legs    Past Surgical History:  Procedure Laterality Date   BREAST BIOPSY Right    ESOPHAGOGASTRODUODENOSCOPY N/A 02/19/2024   Procedure: EGD (ESOPHAGOGASTRODUODENOSCOPY);  Surgeon: Nandigam, Kavitha V, MD;  Location: [facility];  Service: Gastroenterology;  Laterality: N/A;   EYE SURGERY Left 2017   related to blockage in my nose   EYE SURGERY Right 05/2019    INSERT / REPLACE / REMOVE PACEMAKER  02/15/2017   IR GASTROSTOMY TUBE MOD SED  01/01/2023   IR REPLACE G-TUBE SIMPLE WO FLUORO  12/13/2023   PACEMAKER IMPLANT N/A 02/15/2017   Procedure: Pacemaker Implant;  Surgeon: Fernande Elspeth BROCKS, MD;  Location: [facility] INVASIVE CV LAB;  Service: Cardiovascular;  Laterality: N/A;   RIGHT/LEFT HEART CATH AND CORONARY ANGIOGRAPHY N/A 11/16/2022   Procedure: RIGHT/LEFT HEART CATH AND CORONARY ANGIOGRAPHY;  Surgeon: Rolan Ezra RAMAN, MD;  Location: [facility] INVASIVE CV LAB;  Service: Cardiovascular;  Laterality: N/A;   THYROIDECTOMY N/A 04/21/2021   Procedure: TOTAL THYROIDECTOMY;  Surgeon: Rubin Calamity, MD;  Location: [facility] OR;  Service: General;  Laterality: N/A;   Patient Active Problem List   Diagnosis Date Noted   Pain of upper abdomen 02/19/2024   Duodenal nodule 02/19/2024   Aspiration pneumonia (HCC) 05/21/2023   Gastrointestinal tube in situ (HCC) 03/13/2023   Diarrhea 01/23/2023   SIRS (systemic inflammatory response syndrome) (HCC) 01/23/2023   Chronic respiratory failure with hypoxia (HCC) 01/23/2023   Dysphagia 01/23/2023   Chronic systolic CHF (congestive heart failure) (HCC) 01/23/2023   Acute on chronic respiratory failure with hypoxia (HCC) 12/07/2022   Tracheostomy status (HCC) 12/07/2022   Generalized anxiety disorder 12/06/2022   Acute systolic heart failure (HCC) 11/26/2022   Drug-induced torsades de pointes (HCC) 11/17/2022  Acute systolic CHF (congestive heart failure) (HCC) 11/16/2022   Acute hypoxemic respiratory failure (HCC) 11/15/2022   Acute on chronic combined systolic and diastolic CHF (congestive heart failure) (HCC) 11/15/2022   Protein-calorie malnutrition, severe 11/15/2022   Cardiac arrest (HCC) 11/14/2022   Healthcare-associated pneumonia 11/11/2022   Hypothyroidism 11/07/2021   Thyroid  nodule 04/21/2021   S/P total thyroidectomy 04/21/2021   Sinus node dysfunction (HCC) 03/10/2020   Weakness of both legs    Watery eyes     Sickle cell trait (HCC)    Presence of permanent cardiac pacemaker    Excess ear wax    Depression    Anemia    Cardiac pacemaker in situ 03/01/2017   Mobitz type 2 second degree heart block 02/15/2017   Symptomatic advanced heart block 02/12/2017   SOB (shortness of breath) 10/02/2014   RBBB (right bundle branch block with left anterior fascicular block) 10/02/2014   Bifascicular block 10/02/2014   Infection due to trichomonas vaginalis 09/15/2014   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    ONSET DATE: 03/03/2024 (referral date)  REFERRING DIAG: G71.11 (ICD-10-CM) - Myotonic dystrophy, type 1 (HCC) R53.83 (ICD-10-CM) - Other fatigue M62.81 (ICD-10-CM) - Muscle weakness (generalized)  THERAPY DIAG:  Muscle weakness (generalized)  Unsteadiness on feet  Other abnormalities of gait and mobility  Pain in right leg  Pain in left leg  Other symptoms and signs involving the musculoskeletal system  History of falling  Rationale for Evaluation and Treatment: Rehabilitation  SUBJECTIVE:                                                                                                                                                                                             SUBJECTIVE STATEMENT:  Pt enters clinic with her rollator, removes her O2 at beginning of session.  Pt denies any acute changes since last visit, no falls. Pt reports that her pain is doing a little bit better. Reports 5/10 today in her R groin area. She reports that the US  ruled out blood clots. BLE xrays scheduled 9/2 to look at her femurs.  Pt accompanied by: self, pt takes transportation to her appointments (does not drive)  PERTINENT HISTORY: PMH: myotonic dystrophy type I complicated by cardiac arryhtmia s/p PPM (June 2018), dysphagia s/p PEG, and dyspnea on supplemental oxygen  via nasal canula and hyperlipidemia  History of current illness: Starting around the age of 60, she started having lock jaw and  stiffness of the hands.  Over the years, she developed worsening stiffness of her fingers and hands.  she ultimately was genetically tested at the age of 8 which confirmed the diagnosis  of myotonic dystrophy type 1. She has a strong family history of DM1 including maternal aunt, cousins x 2, and younger sister.  She does not have any children and has no future plans for pregnancy.   Symptoms were relatively stable until her mid-30s and then started developing fatigue, weakness, daytime sleepiness, and shortness of breath with exertion.  She walks independently but was told previously told to use leg braces.  Over the past few years, she noticed intermittent difficulty swallowing liquids. She underwent barium swallow which showed signs of aspiration and she was recommended to use a straw and use chin tuck position.     She was seeing several neurologists over the years at [facility] in [place] .  She is not working and has been on disability since 2010.  PAIN:  Are you having pain? Yes: NPRS scale: 5/10 Pain location: R hip/groin area, BLEs Pain description: achy pain Aggravating factors: trying to lift leg up, sometimes bothers her when walking, sitting down for extended period of time Relieving factors: Tylenol  if she can't stand the pain   PRECAUTIONS: Fall, ICD/Pacemaker, and Other: PEG tube, supplemental O2  RED FLAGS: None   WEIGHT BEARING RESTRICTIONS: No  FALLS: Has patient fallen in last 6 months? No  LIVING ENVIRONMENT: Lives with: lives with their family Lives in: House/apartment Stairs: No (one level home) Has following equipment at home: Walker - 4 wheeled  PLOF: Independent with gait, Independent with transfers, and Requires assistive device for independence  PATIENT GOALS: stop the pain  OBJECTIVE:  Note: Objective measures were completed at Evaluation unless otherwise noted.  DIAGNOSTIC FINDINGS: None updated or relevant to this  POC  COGNITION: Overall cognitive status: Within functional limits for tasks assessed   SENSATION: Decreased light touch in RLE  COORDINATION: Not assessed  EDEMA:  None  POSTURE: rounded shoulders, forward head, and posterior pelvic tilt  PALPATION: tenderness to palpation at R anterior hip region and down into R quads; pain with hip IR, with compression through joint, with hip flexion overpressure, mild pain with R hip distraction  LOWER EXTREMITY ROM:     Passive  Right Eval Left Eval  Hip flexion Pain with overpressure   Hip extension    Hip abduction    Hip adduction    Hip internal rotation Pain with PROM   Hip external rotation    Knee flexion    Knee extension    Ankle dorsiflexion    Ankle plantarflexion    Ankle inversion    Ankle eversion     (Blank rows = not tested)  LOWER EXTREMITY MMT:    MMT Right Eval Left Eval  Hip flexion Not tested due to pain 3-  Hip extension    Hip abduction    Hip adduction    Hip internal rotation    Hip external rotation    Knee flexion 3- 3-  Knee extension 3- 3-  Ankle dorsiflexion 3 3  Ankle plantarflexion    Ankle inversion    Ankle eversion    (Blank rows = not tested)  BED MOBILITY:  Mod I per pt report, sleeps in regular bed, no bedrails  TRANSFERS: Sit to stand: Modified independence  Assistive device utilized: Environmental consultant - 4 wheeled     Stand to sit: Modified independence  Assistive device utilized: Environmental consultant - 4 wheeled     Chair to chair: Modified independence  Assistive device utilized: Environmental consultant - 4 wheeled       RAMP:  Not  tested  CURB:  Not tested  STAIRS: Not tested GAIT: Findings:  Gait pattern: decreased hip/knee flexion- Right, decreased hip/knee flexion- Left, and trunk flexed Distance walked: various clinic distances Assistive device utilized: Walker - 4 wheeled Level of assistance: Modified independence Comments: see above   FUNCTIONAL TESTS:    [facility] PT Assessment - 04/10/24 1516        Ambulation/Gait   Gait velocity 32.8 ft over 11.13 sec = 2.95 ft/sec            VITALS Vitals:   04/10/24 1457 04/10/24 1523  BP: 94/68 (!) 85/59  Pulse: 82 73                                                                                                                                 TREATMENT DATE:   Self-Care/Home Management/Ther Act  Assessed vitals in LUE in sitting prior to intervention, BP remains low (see above). Pt denies lightheadedness or dizziness but reports being very fatigued today.  SciFit multi-peaks level 5 for 8 minutes using BUE/BLEs for neural priming for reciprocal movement, dynamic cardiovascular warmup and increased amplitude of stepping. Reassessed vitals following warm-up on SciFit (see above) and BP not improved from initial reading, is lower than initial reading.  Encouraged her to have her pain management appointment and follow-up with cardiology prior to return to PT in a few months to ensure she is more medically stable and better able to tolerate therapy. For LTG assessment:  [facility] PT Assessment - 04/10/24 1516       Ambulation/Gait   Gait velocity 32.8 ft over 11.13 sec = 2.95 ft/sec           PATIENT EDUCATION: Education details: continue HEP, results of OM and functional implications, plan to d/c from OPPT this date - can return in a few months once she has met with pain management and cardiology and is more medically stable, will need a new referral Person educated: Patient Education method: Explanation Education comprehension: verbalized understanding  HOME EXERCISE PROGRAM: Access Code: [email or web address] URL: [email or web address] Date: 03/14/2024 Prepared by: Marlon Plaster  Exercises - Supine Butterfly Groin Stretch  - 1 x daily - 7 x weekly - 10 reps - 10-15 seconds hold - Supine Hip Adduction Isometric with Ball  - 1 x daily - 7 x weekly - 1-2 sets - 15-20 reps - 3-5 seconds hold - Supine Lower Trunk Rotation   - 1 x daily - 7 x weekly - 3 sets - 10 reps - Clamshell  - 1 x daily - 7 x weekly - 3 sets - 10 reps - Seated Hamstring Stretch with Strap  - 1 x daily - 7 x weekly - 1 sets - 3-5 reps - 30 sec hold - Modified Thomas Stretch  - 1 x daily - 7 x weekly - 1 sets - 3-5 reps - 30 sec hold - Diagonal Hip Extension with Resistance  - 1  x daily - 7 x weekly - 3 sets - 10 reps - Diagonal Hip Extension  - 1 x daily - 7 x weekly - 3 sets - 10 reps - Quadruped Adductor Stretch  - 1 x daily - 7 x weekly - 3 sets - 10 reps  GOALS: Goals reviewed with patient? Yes  SHORT TERM GOALS=LONG TERM GOALS due to length of POC   LONG TERM GOALS: Target date: 04/10/2024   Pt will be independent with final HEP for improved strength, balance, transfers and gait as well as for independence with management of pain symptoms. Baseline:  Goal status: MET  2.  Pt will improve gait velocity to at least 2.5 ft/sec for improved gait efficiency and performance at mod I level  Baseline: 2.3 ft/sec mod I with rollator (7/7), 2.95 ft/sec mod I with rollator (8/7) Goal status: MET   3.  Pt will report </= 4/10 pain in her RLE at the most to demonstrate improved function and decreased pain level. Baseline: 6/10 (7/7), 5/10 (8/7) Goal status: NOT MET    ASSESSMENT:  CLINICAL IMPRESSION: Emphasis of skilled PT session on continuing to assess BP and BP response to exercise as well as assessing LTG with plan to d/c from OPPT services this date. Pt's BP remains low and does not increase with physical activity this session. She remains asymptomatic to BP changes and readings. She has met 2/3 LTG due to being independent with her final HEP and improving her gait speed form 2.3 ft/sec initially to 2.95 ft/sec this date. Her pain overall improved but she did not quite meet the goal for her pain level. Plan to d/c from OPPT service and have her continue independently with her HEP. She can return to PT in a few months with a new  referral once she is more medically stable in terms of pain management and BP.    OBJECTIVE IMPAIRMENTS: cardiopulmonary status limiting activity, decreased activity tolerance, decreased balance, decreased endurance, decreased knowledge of condition, decreased ROM, decreased strength, improper body mechanics, postural dysfunction, and pain.   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   REHAB POTENTIAL: Fair chronic nature of her myotonic dystrophy type 1, has been seen for PT for multiple POC previously  CLINICAL DECISION MAKING: Stable/uncomplicated  EVALUATION COMPLEXITY: Low  PLAN: discharge from PT   Waddell Southgate, PT Waddell Southgate, PT, DPT, CSRS    04/10/2024, 3:26 PM

## 2024-04-11 ENCOUNTER — Other Ambulatory Visit: Payer: Self-pay | Admitting: Family Medicine

## 2024-04-14 ENCOUNTER — Ambulatory Visit (HOSPITAL_COMMUNITY)
Admission: RE | Admit: 2024-04-14 | Discharge: 2024-02-19 | Disposition: A | Discharge status: Home / Self Care | Attending: Gastroenterology | Admitting: Gastroenterology

## 2024-04-14 ENCOUNTER — Other Ambulatory Visit: Payer: Self-pay | Admitting: Family Medicine

## 2024-04-15 LAB — T3: T3, Total: 52 ng/dL — ABNORMAL LOW (ref 71–180)

## 2024-04-15 LAB — T4, FREE: Free T4: 0.58 ng/dL — ABNORMAL LOW (ref 0.82–1.77)

## 2024-04-16 ENCOUNTER — Ambulatory Visit: Payer: Self-pay | Admitting: Family Medicine

## 2024-04-16 DIAGNOSIS — E890 Postprocedural hypothyroidism: Secondary | ICD-10-CM

## 2024-04-16 LAB — SPECIMEN STATUS REPORT

## 2024-04-16 LAB — TSH: TSH: 34 u[IU]/mL — ABNORMAL HIGH (ref 0.450–4.500)

## 2024-04-16 MED ORDER — LEVOTHYROXINE SODIUM 112 MCG PO TABS: 112.0000 ug | ORAL_TABLET | Freq: Every day | ORAL | 1 refills | Status: DC

## 2024-04-29 ENCOUNTER — Inpatient Hospital Stay: Admitting: Family Medicine

## 2024-04-29 ENCOUNTER — Telehealth: Payer: Self-pay

## 2024-04-29 NOTE — Telephone Encounter (Signed)
 Copied from CRM [id number]. Topic: Referral - Question >> Apr 28, 2024  5:04 PM Delon DASEN wrote: Reason for CRM: need referral for [facility] for feeding tube- [phone]

## 2024-04-30 NOTE — Telephone Encounter (Signed)
 LVM for return phone call.  More information is needed to place referral.

## 2024-05-06 DIAGNOSIS — G8929 Other chronic pain: Secondary | ICD-10-CM | POA: Diagnosis not present

## 2024-05-06 DIAGNOSIS — M5416 Radiculopathy, lumbar region: Secondary | ICD-10-CM | POA: Diagnosis not present

## 2024-05-07 ENCOUNTER — Other Ambulatory Visit: Payer: Self-pay | Admitting: Nurse Practitioner

## 2024-05-07 DIAGNOSIS — M5416 Radiculopathy, lumbar region: Secondary | ICD-10-CM

## 2024-05-14 ENCOUNTER — Other Ambulatory Visit: Payer: Self-pay | Admitting: Family Medicine

## 2024-05-15 NOTE — Telephone Encounter (Signed)
 Requested medication (s) are due for refill today: yes  Requested medication (s) are on the active medication list: yes  Last refill:  04/14/24 #30  Future visit scheduled: yes  Notes to clinic:  med not delegated to NT to RF   Requested Prescriptions  Pending Prescriptions Disp Refills   metoCLOPramide  (REGLAN ) 5 MG tablet [Pharmacy Med Name: METOCLOPRAMIDE  5 MG TABLET] 30 tablet 0    Sig: PLACE 1 TABLET (5 MG TOTAL) INTO FEEDING TUBE 3 (THREE) TIMES DAILY BEFORE MEALS.     Not Delegated - Gastroenterology: Antiemetics - metoclopramide  Failed - 05/15/2024 10:42 AM      Failed - This refill cannot be delegated      Passed - Cr in normal range and within 360 days    Creatinine  Date Value Ref Range Status  02/14/2018 0.74 0.60 - 1.10 mg/dL Final   Creatinine, Ser  Date Value Ref Range Status  03/22/2024 0.77 0.44 - 1.00 mg/dL Final         Passed - Valid encounter within last 6 months    Recent Outpatient Visits           1 month ago Myotonic dystrophy, type 1 (HCC)   [facility] Of [place] Delbert Clam, MD   3 months ago Postoperative hypothyroidism   [facility] Of [place]. [facility] Delbert Clam, MD   8 months ago Postoperative hypothyroidism   [facility] Dept Of [place]. [facility] Delbert Clam, MD   1 year ago Bronchiectasis without complication [facility])   [facility] Of [place]. [facility] Delbert Clam, MD   1 year ago Diarrhea, unspecified type   [facility] Of [place]. [facility] Delbert Clam, MD       Future Appointments             In 4 months Delbert Clam, MD [facility] - A Dept Of . [facility] [place]

## 2024-05-19 ENCOUNTER — Ambulatory Visit (INDEPENDENT_AMBULATORY_CARE_PROVIDER_SITE_OTHER): Payer: Self-pay

## 2024-05-19 DIAGNOSIS — I469 Cardiac arrest, cause unspecified: Secondary | ICD-10-CM | POA: Diagnosis not present

## 2024-05-20 LAB — CUP PACEART REMOTE DEVICE CHECK
Battery Remaining Longevity: 24 mo
Battery Voltage: 2.91 V
Brady Statistic AP VP Percent: 61.98 %
Brady Statistic AP VS Percent: 0 %
Brady Statistic AS VP Percent: 38.02 %
Brady Statistic AS VS Percent: 0 %
Brady Statistic RA Percent Paced: 61.96 %
Brady Statistic RV Percent Paced: 100 %
Date Time Interrogation Session: 20250915021933
Implantable Lead Connection Status: 753985
Implantable Lead Connection Status: 753985
Implantable Lead Implant Date: 20180614
Implantable Lead Implant Date: 20180614
Implantable Lead Location: 753859
Implantable Lead Location: 753860
Implantable Lead Model: 3830
Implantable Lead Model: 5076
Implantable Pulse Generator Implant Date: 20180614
Lead Channel Impedance Value: 285 Ohm
Lead Channel Impedance Value: 361 Ohm
Lead Channel Impedance Value: 399 Ohm
Lead Channel Impedance Value: 513 Ohm
Lead Channel Pacing Threshold Amplitude: 0.75 V
Lead Channel Pacing Threshold Amplitude: 1.375 V
Lead Channel Pacing Threshold Pulse Width: 0.4 ms
Lead Channel Pacing Threshold Pulse Width: 0.4 ms
Lead Channel Sensing Intrinsic Amplitude: 1.875 mV
Lead Channel Sensing Intrinsic Amplitude: 1.875 mV
Lead Channel Sensing Intrinsic Amplitude: 4.125 mV
Lead Channel Sensing Intrinsic Amplitude: 4.125 mV
Lead Channel Setting Pacing Amplitude: 2 V
Lead Channel Setting Pacing Amplitude: 2.5 V
Lead Channel Setting Pacing Pulse Width: 1 ms
Lead Channel Setting Sensing Sensitivity: 0.9 mV
Zone Setting Status: 755011
Zone Setting Status: 755011

## 2024-05-21 DIAGNOSIS — E43 Unspecified severe protein-calorie malnutrition: Secondary | ICD-10-CM | POA: Diagnosis not present

## 2024-05-21 DIAGNOSIS — R1310 Dysphagia, unspecified: Secondary | ICD-10-CM | POA: Diagnosis not present

## 2024-05-21 DIAGNOSIS — G931 Anoxic brain damage, not elsewhere classified: Secondary | ICD-10-CM | POA: Diagnosis not present

## 2024-05-21 DIAGNOSIS — Z931 Gastrostomy status: Secondary | ICD-10-CM | POA: Diagnosis not present

## 2024-05-22 ENCOUNTER — Ambulatory Visit: Payer: Self-pay | Admitting: Cardiology

## 2024-05-24 ENCOUNTER — Encounter (HOSPITAL_COMMUNITY): Payer: Self-pay | Admitting: *Deleted

## 2024-05-24 ENCOUNTER — Other Ambulatory Visit: Payer: Self-pay

## 2024-05-24 ENCOUNTER — Emergency Department (HOSPITAL_COMMUNITY)
Admission: EM | Admit: 2024-05-24 | Discharge: 2024-05-24 | Disposition: A | Discharge status: Home / Self Care | Attending: Emergency Medicine | Admitting: Emergency Medicine

## 2024-05-24 DIAGNOSIS — K9423 Gastrostomy malfunction: Secondary | ICD-10-CM | POA: Insufficient documentation

## 2024-05-24 NOTE — Discharge Instructions (Addendum)
 Return for any problem.  ?

## 2024-05-24 NOTE — ED Triage Notes (Signed)
 Pt has leaking around tube feeding ports, # 18 G tube.

## 2024-05-24 NOTE — ED Provider Notes (Signed)
 [place] EMERGENCY DEPARTMENT AT [facility] Provider Note   CSN: [id number] Arrival date & time: 05/24/24  1728     Patient presents with: Peg tube leaking   Cynthia Underwood is a 53 y.o. female.   53 year old female with prior medical history as detailed below presents for evaluation.  Patient reports that her feeding tube Wilfred valve broke.  She request a replacement.  After some difficulty, a replacement Lopez valve with ENFit adapter found.  This was fitted to the patient's G-tube without difficulty.  G-tube is functional with appropriate valve.  The history is provided by the patient and medical records.       Prior to Admission medications   Medication Sig Start Date End Date Taking? Authorizing Provider  acetaminophen  (TYLENOL ) 500 MG tablet Place 500 mg into feeding tube every 6 (six) hours as needed for mild pain.    [provider]  DULoxetine  HCl (DRIZALMA SPRINKLE ) 20 MG CSDR Open capsule and mix pellets with 50mL water , then administer via PEG. 03/20/24   Patel, Donika K, DO  esomeprazole  (NEXIUM ) 40 MG packet Take 40 mg by mouth daily before breakfast. 02/19/24   Nandigam, Kavitha V, MD  guaifenesin  (HUMIBID E) 400 MG TABS tablet 400 mg 3 (three) times daily. Place 400mg  into feeding tube three times a day Patient not taking: Reported on 04/02/2024 01/19/23   [provider]  ipratropium-albuterol  (DUONEB) 0.5-2.5 (3) MG/3ML SOLN Inhale 3 mLs into the lungs every 4 (four) hours as needed. SMARTSIG:1 Unspecified Via Inhaler Every 4 Hours PRN Patient not taking: Reported on 04/02/2024 05/09/23   Kara Dorn NOVAK, MD  Iron-Vitamins (GERITOL) LIQD Take 10 mLs by mouth 2 (two) times daily. Patient not taking: Reported on 04/02/2024 01/22/24   Newlin, Enobong, MD  levothyroxine  (SYNTHROID ) 112 MCG tablet Place 1 tablet (112 mcg total) into feeding tube daily before breakfast. 04/16/24   Delbert Clam, MD  lidocaine  (LIDODERM ) 5 % Place 1 patch  onto the skin daily. Remove & Discard patch within 12 hours or as directed by MD 03/22/24   Glendia, Rocky SAILOR, PA-C  metoCLOPramide  (REGLAN ) 5 MG tablet PLACE 1 TABLET (5 MG TOTAL) INTO FEEDING TUBE 3 (THREE) TIMES DAILY BEFORE MEALS. 05/16/24   Newlin, Enobong, MD  metoprolol  tartrate (LOPRESSOR ) 25 MG tablet PLACE 0.5 TABLETS (12.5 MG TOTAL) INTO FEEDING TUBE 2 (TWO) TIMES DAILY. Patient not taking: Reported on 03/05/2024 02/12/24   Newlin, Enobong, MD  Misc. Devices MISC Eval & titrate pt for poc for portable oxygen  container. If patient qualifies, dispense at 1-6 pulse dose. Please fax to [phone]. Diagnosis-aspiration pneumonia 06/26/23   Delbert Clam, MD  Misc. Devices MISC Shower chair.  Diagnosis myotonic dystrophy 03/10/24   Delbert Clam, MD  modafinil  (PROVIGIL ) 100 MG tablet Take 1 tablet (100 mg total) by mouth daily. 03/03/24   Patel, Donika K, DO  Nutritional Supplements (FEEDING SUPPLEMENT, JEVITY 1.5 CAL/FIBER,) LIQD Place 237 mLs into feeding tube 4 (four) times daily. FWF before and after each feeding 12/24/23   Newlin, Enobong, MD  spironolactone  (ALDACTONE ) 25 MG tablet PLACE 0.5 TABLETS (12.5 MG TOTAL) INTO FEEDING TUBE DAILY. 10/01/23   Newlin, Enobong, MD  Water  For Irrigation, Sterile (FREE WATER ) SOLN 100ml free water  via tube before and after each feeding 05/25/23   Cheryle Page, MD    Allergies: Penicillin g and Penicillins    Review of Systems  All other systems reviewed and are negative.   Updated Vital Signs BP 93/61 (  BP Location: Right Arm)   Pulse 85   Temp (!) 97.4 F (36.3 C) (Oral)   Resp 20   Ht 5' 3 (1.6 m)   Wt 45.4 kg   LMP 12/03/2016 (Approximate)   SpO2 94%   BMI 17.71 kg/m   Physical Exam Vitals and nursing note reviewed.  Constitutional:      General: She is not in acute distress.    Appearance: Normal appearance. She is well-developed.  HENT:     Head: Normocephalic and atraumatic.  Eyes:     Conjunctiva/sclera: Conjunctivae  normal.     Pupils: Pupils are equal, round, and reactive to light.  Cardiovascular:     Rate and Rhythm: Normal rate and regular rhythm.     Heart sounds: Normal heart sounds.  Pulmonary:     Effort: Pulmonary effort is normal. No respiratory distress.     Breath sounds: Normal breath sounds.  Abdominal:     General: There is no distension.     Palpations: Abdomen is soft.     Tenderness: There is no abdominal tenderness.     Comments: G-tube in place.  Lopez valve with ENFIT adapter is broken.   Musculoskeletal:        General: No deformity. Normal range of motion.     Cervical back: Normal range of motion and neck supple.  Skin:    General: Skin is warm and dry.  Neurological:     General: No focal deficit present.     Mental Status: She is alert and oriented to person, place, and time.        (all labs ordered are listed, but only abnormal results are displayed) Labs Reviewed - No data to display  EKG: None  Radiology: No results found.   Procedures   Medications Ordered in the ED - No data to display                                  Medical Decision Making Patient requests replacement of her LOPEZ adapter with ENFIT adapter.   After replacement of the Adapter patient is happy and is appropriate for discharge.  Importance of close follow-up stressed.  Strict return precautions given understood.        Final diagnoses:  PEG tube malfunction [facility])    ED Discharge Orders     None          Laurice Maude BROCKS, MD 05/24/24 2219

## 2024-05-26 NOTE — Progress Notes (Signed)
 Remote PPM Transmission

## 2024-05-29 ENCOUNTER — Other Ambulatory Visit: Payer: Self-pay | Admitting: Nurse Practitioner

## 2024-05-29 DIAGNOSIS — M5416 Radiculopathy, lumbar region: Secondary | ICD-10-CM

## 2024-06-03 DIAGNOSIS — M5416 Radiculopathy, lumbar region: Secondary | ICD-10-CM | POA: Diagnosis not present

## 2024-06-03 DIAGNOSIS — G8929 Other chronic pain: Secondary | ICD-10-CM | POA: Diagnosis not present

## 2024-06-05 ENCOUNTER — Ambulatory Visit
Admission: RE | Admit: 2024-06-05 | Discharge: 2024-06-05 | Disposition: A | Discharge status: Home / Self Care | Source: Ambulatory Visit | Attending: Nurse Practitioner | Admitting: Nurse Practitioner

## 2024-06-05 DIAGNOSIS — M5416 Radiculopathy, lumbar region: Secondary | ICD-10-CM | POA: Diagnosis not present

## 2024-06-09 ENCOUNTER — Telehealth: Payer: Self-pay | Admitting: Family Medicine

## 2024-06-09 NOTE — Telephone Encounter (Signed)
 1st attempt lvm to call back resch appt pcp not in the office

## 2024-06-11 ENCOUNTER — Ambulatory Visit: Admitting: Family Medicine

## 2024-06-13 ENCOUNTER — Other Ambulatory Visit: Payer: Self-pay | Admitting: Family Medicine

## 2024-06-16 ENCOUNTER — Ambulatory Visit: Payer: Self-pay

## 2024-06-16 NOTE — Telephone Encounter (Signed)
 FYI Only or Action Required?: FYI only for provider.  Patient was last seen in primary care on 04/02/2024 by Newlin, Enobong, MD.  Called Nurse Triage reporting [place].  Symptoms began 1-2 days ago.  Interventions attempted: OTC medications: Tylenol .  Symptoms are: gradually worsening.  Triage Disposition: See Physician Within 24 Hours  Patient/caregiver understands and will follow disposition?: Yes                             Copied from CRM [id number]. Topic: Clinical - Red Word Triage >> Jun 16, 2024 11:26 AM Corin V wrote: [facility] that prompted transfer to Nurse Triage: Patient has a bump on her buttocks that is causing severe pain. Even brushing it will cause pain. Reason for Disposition  [1] Swelling is painful to touch AND [2] no fever  Answer Assessment - Initial Assessment Questions 1. APPEARANCE of SWELLING: What does it look like?     Unsure, states she cannot see area 2. SIZE: How large is the swelling? (e.g., inches, cm; or compare to size of pinhead, tip of pen, eraser, coin, pea, grape, ping pong ball)      Estimates size of a nickel to quarter 3. LOCATION: Where is the swelling located?     Right buttocks  4. ONSET: When did the swelling start?     1-2 days ago  5. COLOR: What color is it? Is there more than one color?     Unsure 6. PAIN: Is there any pain? If Yes, ask: How bad is the pain? (Scale 1-10; or mild, moderate, severe)       Rates pain a 10 7. ITCH: Does it itch? If Yes, ask: How bad is the itch?      Denies  8. CAUSE: What do you think caused the swelling?     Unsure  9 OTHER SYMPTOMS: Do you have any other symptoms? (e.g., fever)     Denies fever    No availability in office. Advised UC. Patient verbalized understanding and agreed to go.  Protocols used: Skin Lump or Localized Swelling-A-AH

## 2024-06-16 NOTE — Telephone Encounter (Signed)
 Noted

## 2024-06-17 ENCOUNTER — Encounter (HOSPITAL_COMMUNITY): Payer: Self-pay

## 2024-06-17 ENCOUNTER — Ambulatory Visit (HOSPITAL_COMMUNITY)
Admission: RE | Admit: 2024-06-17 | Discharge: 2024-06-17 | Disposition: A | Discharge status: Home / Self Care | Source: Ambulatory Visit | Attending: Emergency Medicine | Admitting: Emergency Medicine

## 2024-06-17 VITALS — BP 92/57 | HR 74 | Temp 97.5°F | Resp 16

## 2024-06-17 DIAGNOSIS — L0231 Cutaneous abscess of buttock: Secondary | ICD-10-CM | POA: Diagnosis not present

## 2024-06-17 MED ORDER — CEFTRIAXONE SODIUM 1 G IJ SOLR
INTRAMUSCULAR | Status: AC
  Filled 2024-06-17: qty 10

## 2024-06-17 MED ORDER — LIDOCAINE HCL (PF) 1 % IJ SOLN
INTRAMUSCULAR | Status: AC
  Filled 2024-06-17: qty 2

## 2024-06-17 MED ORDER — CEFTRIAXONE SODIUM 1 G IJ SOLR
1.0000 g | Freq: Once | INTRAMUSCULAR | Status: AC
  Administered 2024-06-17: 1 g via INTRAMUSCULAR

## 2024-06-17 MED ORDER — SULFAMETHOXAZOLE-TRIMETHOPRIM 200-40 MG/5ML PO SUSP: 160.0000 mg | Freq: Two times a day (BID) | ORAL | 0 refills | Status: AC

## 2024-06-17 MED ORDER — SULFAMETHOXAZOLE-TRIMETHOPRIM 800-160 MG PO TABS: 1.0000 | ORAL_TABLET | Freq: Two times a day (BID) | ORAL | 0 refills | Status: DC

## 2024-06-17 NOTE — Discharge Instructions (Addendum)
 The abscess on your right buttock is firm and not amenable to drainage at this time.   For treatment, we provided you with an injection of a third-generation cephalosporin called ceftriaxone  for rapid treatment of the bacteria causing your pain and swelling at this time.    Please pick up and begin taking Bactrim  20 mL twice daily for the next 10 days.  Your first dose should be given this evening around 6 to 8 PM.  You are welcome to continue applying a warm compress to the area if you wish but this is not required.  If the lesion on your right buttock becomes worse and not better despite these treatment recommendations, please go to the emergency room for further evaluation.  Ultrasound may be needed to better determine the depth and breadth of the infection.  Thank you for visiting [facility] today.  We appreciate the opportunity to participate in your care.

## 2024-06-17 NOTE — ED Triage Notes (Signed)
 Pt c/o abscess to rt side of upper buttocks since Saturday. Denies drainage. States took hot shower.

## 2024-06-17 NOTE — ED Provider Notes (Signed)
 [facility]    CSN: [id number] Arrival date & time: 06/17/24  1037    HISTORY   Chief Complaint  Patient presents with   Skin Ulcer    Painful mass on buttocks. - Entered by patient   Abscess   HPI KIMBA LOTTES is a pleasant, 53 y.o. female who presents to urgent care today. Patient here with caregiver.  Patient complains of an abscess that appeared on her right buttock 4 days ago.  Patient states the lesion has not drained but is very tender to touch.  Patient states the area is swollen and warm.  Patient states she took a hot shower which did not seem to alleviate the pain or swelling.  Patient denies history of frequent abscesses, states she has never had a similar abscess on her right buttock before.  Patient states she has a PEG tube in place and is not able to take any medications by mouth.  The history is provided by the patient and a caregiver.  Abscess  Past Medical History:  Diagnosis Date   Bifascicular block 10/02/2014   Cataract    Dyspnea    Excess ear wax    Multinodular goiter    Pneumonia    Presence of permanent cardiac pacemaker    RBBB (right bundle branch block with left anterior fascicular block) 10/02/2014   Sickle cell trait    Watery eyes    Left   Weakness of both legs    Patient Active Problem List   Diagnosis Date Noted   Pain of upper abdomen 02/19/2024   Duodenal nodule 02/19/2024   Aspiration pneumonia (HCC) 05/21/2023   Gastrointestinal tube in situ (HCC) 03/13/2023   Diarrhea 01/23/2023   SIRS (systemic inflammatory response syndrome) (HCC) 01/23/2023   Chronic respiratory failure with hypoxia (HCC) 01/23/2023   Dysphagia 01/23/2023   Chronic systolic CHF (congestive heart failure) (HCC) 01/23/2023   Acute on chronic respiratory failure with hypoxia (HCC) 12/07/2022   Tracheostomy status (HCC) 12/07/2022   Generalized anxiety disorder 12/06/2022   Acute systolic heart failure (HCC) 11/26/2022   Drug-induced  torsades de pointes (HCC) 11/17/2022   Acute systolic CHF (congestive heart failure) (HCC) 11/16/2022   Acute hypoxemic respiratory failure (HCC) 11/15/2022   Acute on chronic combined systolic and diastolic CHF (congestive heart failure) (HCC) 11/15/2022   Protein-calorie malnutrition, severe 11/15/2022   Cardiac arrest (HCC) 11/14/2022   Healthcare-associated pneumonia 11/11/2022   Hypothyroidism 11/07/2021   Thyroid  nodule 04/21/2021   S/P total thyroidectomy 04/21/2021   Sinus node dysfunction (HCC) 03/10/2020   Weakness of both legs    Watery eyes    Sickle cell trait    Presence of permanent cardiac pacemaker    Excess ear wax    Depression    Anemia    Cardiac pacemaker in situ 03/01/2017   Mobitz type 2 second degree heart block 02/15/2017   Symptomatic advanced heart block 02/12/2017   SOB (shortness of breath) 10/02/2014   RBBB (right bundle branch block with left anterior fascicular block) 10/02/2014   Bifascicular block 10/02/2014   Infection due to trichomonas vaginalis 09/15/2014   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   Past Surgical History:  Procedure Laterality Date   BREAST BIOPSY Right    ESOPHAGOGASTRODUODENOSCOPY N/A 02/19/2024   Procedure: EGD (ESOPHAGOGASTRODUODENOSCOPY);  Surgeon: Nandigam, Kavitha V, MD;  Location: [facility];  Service: Gastroenterology;  Laterality: N/A;   EYE SURGERY Left 2017   related to blockage in my nose   EYE SURGERY  Right 05/2019   INSERT / REPLACE / REMOVE PACEMAKER  02/15/2017   IR GASTROSTOMY TUBE MOD SED  01/01/2023   IR REPLACE G-TUBE SIMPLE WO FLUORO  12/13/2023   PACEMAKER IMPLANT N/A 02/15/2017   Procedure: Pacemaker Implant;  Surgeon: Fernande Elspeth BROCKS, MD;  Location: [facility] INVASIVE CV LAB;  Service: Cardiovascular;  Laterality: N/A;   RIGHT/LEFT HEART CATH AND CORONARY ANGIOGRAPHY N/A 11/16/2022   Procedure: RIGHT/LEFT HEART CATH AND CORONARY ANGIOGRAPHY;  Surgeon: Rolan Ezra RAMAN, MD;  Location: [facility] INVASIVE CV LAB;   Service: Cardiovascular;  Laterality: N/A;   THYROIDECTOMY N/A 04/21/2021   Procedure: TOTAL THYROIDECTOMY;  Surgeon: Rubin Calamity, MD;  Location: [facility] OR;  Service: General;  Laterality: N/A;   OB History     Gravida  2   Para      Term      Preterm      AB  2   Living  0      SAB      IAB  2   Ectopic      Multiple      Live Births             Home Medications    Prior to Admission medications   Medication Sig Start Date End Date Taking? Authorizing Provider  sulfamethoxazole -trimethoprim  (BACTRIM ) 200-40 MG/5ML suspension Take 20 mLs (160 mg of trimethoprim  total) by mouth 2 (two) times daily for 10 days. 06/17/24 06/27/24 Yes Joesph Shaver Scales, PA-C  acetaminophen  (TYLENOL ) 500 MG tablet Place 500 mg into feeding tube every 6 (six) hours as needed for mild pain.    [provider]  DULoxetine  HCl (DRIZALMA SPRINKLE ) 20 MG CSDR Open capsule and mix pellets with 50mL water , then administer via PEG. 03/20/24   Patel, Donika K, DO  esomeprazole  (NEXIUM ) 40 MG packet Take 40 mg by mouth daily before breakfast. 02/19/24   Nandigam, Kavitha V, MD  Iron-Vitamins (GERITOL) LIQD Take 10 mLs by mouth 2 (two) times daily. Patient not taking: Reported on 04/02/2024 01/22/24   Newlin, Enobong, MD  levothyroxine  (SYNTHROID ) 112 MCG tablet Place 1 tablet (112 mcg total) into feeding tube daily before breakfast. 04/16/24   Delbert Clam, MD  lidocaine  (LIDODERM ) 5 % Place 1 patch onto the skin daily. Remove & Discard patch within 12 hours or as directed by MD 03/22/24   Glendia Rocky SAILOR, PA-C  metoCLOPramide  (REGLAN ) 5 MG tablet PLACE 1 TABLET (5 MG TOTAL) INTO FEEDING TUBE 3 (THREE) TIMES DAILY BEFORE MEALS. 06/14/24   Vicci Barnie NOVAK, MD  Misc. Devices MISC Eval & titrate pt for poc for portable oxygen  container. If patient qualifies, dispense at 1-6 pulse dose. Please fax to [phone]. Diagnosis-aspiration pneumonia 06/26/23   Delbert Clam, MD  Misc. Devices  MISC Shower chair.  Diagnosis myotonic dystrophy 03/10/24   Delbert Clam, MD  modafinil  (PROVIGIL ) 100 MG tablet Take 1 tablet (100 mg total) by mouth daily. 03/03/24   Patel, Donika K, DO  Nutritional Supplements (FEEDING SUPPLEMENT, JEVITY 1.5 CAL/FIBER,) LIQD Place 237 mLs into feeding tube 4 (four) times daily. FWF before and after each feeding 12/24/23   Newlin, Enobong, MD  spironolactone  (ALDACTONE ) 25 MG tablet PLACE 0.5 TABLETS (12.5 MG TOTAL) INTO FEEDING TUBE DAILY. 10/01/23   Newlin, Enobong, MD  Water  For Irrigation, Sterile (FREE WATER ) SOLN 100ml free water  via tube before and after each feeding 05/25/23   Cheryle Page, MD    Family History Family History  Problem Relation Age of  Onset   Cancer Father 48       Deceased   Heart disease Father    COPD Mother    Diabetes Maternal Uncle    Heart disease Maternal Uncle    Heart disease Paternal Grandmother    Diabetes Paternal Grandmother    Hypertension Paternal Grandmother    Neuromuscular disorder Maternal Aunt        Myotonic dystrophy   Neuromuscular disorder Cousin        Myotonic dystrophy   Neuromuscular disorder Sister        Myotonic dystrophy   Colon cancer Neg Hx    Colon polyps Neg Hx    Esophageal cancer Neg Hx    Rectal cancer Neg Hx    Stomach cancer Neg Hx    Social History Social History   Tobacco Use   Smoking status: Former    Current packs/day: 0.00    Types: Cigarettes    Start date: 10/04/1988    Quit date: 10/04/2016    Years since quitting: 7.7   Smokeless tobacco: Never  Vaping Use   Vaping status: Never Used  Substance Use Topics   Alcohol use: Not Currently    Alcohol/week: 0.0 standard drinks of alcohol    Comment:  few times a year   Drug use: Not Currently    Types: Marijuana    Comment: occ    Allergies   Penicillins  Review of Systems Review of Systems Pertinent findings revealed after performing a 14 point review of systems has been noted in the history of present  illness.  Physical Exam Vital Signs BP (!) 92/57 (BP Location: Right Arm)   Pulse 74   Temp (!) 97.5 F (36.4 C) (Oral)   Resp 16   LMP 12/03/2016 (Approximate)   SpO2 93%   No data found.  Physical Exam Vitals and nursing note reviewed.  Constitutional:      General: She is awake. She is not in acute distress.    Appearance: Normal appearance. She is well-developed and well-groomed. She is not ill-appearing.  Musculoskeletal:       Legs:  Neurological:     Mental Status: She is alert.  Psychiatric:        Behavior: Behavior is cooperative.     Visual Acuity Right Eye Distance:   Left Eye Distance:   Bilateral Distance:    Right Eye Near:   Left Eye Near:    Bilateral Near:     UC Couse / Diagnostics / Procedures:     Radiology No results found.  Procedures Procedures (including critical care time) EKG  Pending results:  Labs Reviewed - No data to display  Medications Ordered in UC: Medications  cefTRIAXone  (ROCEPHIN ) injection 1 g (has no administration in time range)    UC Diagnoses / Final Clinical Impressions(s)   I have reviewed the triage vital signs and the nursing notes.  Pertinent labs & imaging results that were available during my care of the patient were reviewed by me and considered in my medical decision making (see chart for details).    Final diagnoses:  Abscess of buttock, right   Patient advised area is not amenable to being drained at this time.  Patient reports a history of penicillin but has been given ceftriaxone  multiple times in the past, patient provided with an injection of ceftriaxone  during her visit today for rapid relief of infection.  Patient advised to pick up and begin taking Bactrim  20 mL twice daily  for the next 10 days.  Conservative care recommended.  Return precautions advised.  Please see discharge instructions below for details of plan of care as provided to patient. ED Prescriptions     Medication Sig  Dispense Auth. Provider   sulfamethoxazole -trimethoprim  (BACTRIM ) 200-40 MG/5ML suspension Take 20 mLs (160 mg of trimethoprim  total) by mouth 2 (two) times daily for 10 days. 400 mL Joesph Shaver Scales, PA-C      PDMP not reviewed this encounter.  Pending results:  Labs Reviewed - No data to display    Discharge Instructions      The abscess on your right buttock is firm and not amenable to drainage at this time.   For treatment, we provided you with an injection of a third-generation cephalosporin called ceftriaxone  for rapid treatment of the bacteria causing your pain and swelling at this time.    Please pick up and begin taking Bactrim  20 mL twice daily for the next 10 days.  Your first dose should be given this evening around 6 to 8 PM.  You are welcome to continue applying a warm compress to the area if you wish but this is not required.  If the lesion on your right buttock becomes worse and not better despite these treatment recommendations, please go to the emergency room for further evaluation.  Ultrasound may be needed to better determine the depth and breadth of the infection.  Thank you for visiting [facility] today.  We appreciate the opportunity to participate in your care.    Disposition Upon Discharge:  Condition: stable for discharge home  Patient presented with an acute illness with associated systemic symptoms and significant discomfort requiring urgent management. In my opinion, this is a condition that a prudent lay person (someone who possesses an average knowledge of health and medicine) may potentially expect to result in complications if not addressed urgently such as respiratory distress, impairment of bodily function or dysfunction of bodily organs.   Routine symptom specific, illness specific and/or disease specific instructions were discussed with the patient and/or caregiver at length.   As such, the patient has been evaluated and  assessed, work-up was performed and treatment was provided in alignment with urgent care protocols and evidence based medicine.  Patient/parent/caregiver has been advised that the patient may require follow up for further testing and treatment if the symptoms continue in spite of treatment, as clinically indicated and appropriate.  Patient/parent/caregiver has been advised to return to the [facility] or PCP if no better; to PCP or the Emergency Department if new signs and symptoms develop, or if the current signs or symptoms continue to change or worsen for further workup, evaluation and treatment as clinically indicated and appropriate  The patient will follow up with their current PCP if and as advised. If the patient does not currently have a PCP we will assist them in obtaining one.   The patient may need specialty follow up if the symptoms continue, in spite of conservative treatment and management, for further workup, evaluation, consultation and treatment as clinically indicated and appropriate.  Patient/parent/caregiver verbalized understanding and agreement of plan as discussed.  All questions were addressed during visit.  Please see discharge instructions below for further details of plan.  This office note has been dictated using Teaching laboratory technician.  Unfortunately, this method of dictation can sometimes lead to typographical or grammatical errors.  I apologize for your inconvenience in advance if this occurs.  Please do not hesitate to  reach out to me if clarification is needed.      Joesph Shaver Scales, [place] 06/17/24 1208

## 2024-06-24 DIAGNOSIS — M791 Myalgia, unspecified site: Secondary | ICD-10-CM | POA: Diagnosis not present

## 2024-06-24 DIAGNOSIS — R1310 Dysphagia, unspecified: Secondary | ICD-10-CM | POA: Diagnosis not present

## 2024-06-24 DIAGNOSIS — E43 Unspecified severe protein-calorie malnutrition: Secondary | ICD-10-CM | POA: Diagnosis not present

## 2024-06-24 DIAGNOSIS — Z931 Gastrostomy status: Secondary | ICD-10-CM | POA: Diagnosis not present

## 2024-06-24 DIAGNOSIS — G931 Anoxic brain damage, not elsewhere classified: Secondary | ICD-10-CM | POA: Diagnosis not present

## 2024-06-24 DIAGNOSIS — I5043 Acute on chronic combined systolic (congestive) and diastolic (congestive) heart failure: Secondary | ICD-10-CM | POA: Diagnosis not present

## 2024-06-30 ENCOUNTER — Ambulatory Visit: Attending: Family Medicine

## 2024-06-30 ENCOUNTER — Telehealth: Payer: Self-pay | Admitting: Family Medicine

## 2024-06-30 DIAGNOSIS — E890 Postprocedural hypothyroidism: Secondary | ICD-10-CM | POA: Diagnosis not present

## 2024-06-30 NOTE — Telephone Encounter (Signed)
 Patient is here at [facility] for labs.

## 2024-06-30 NOTE — Telephone Encounter (Signed)
 Copied from CRM [id number]. Topic: Clinical - Request for Lab/Test Order >> Jun 30, 2024  9:15 AM Avram MATSU wrote:  Reason for CRM: patient has orders for labs and stated her orders came on three sheets. The patient was told she needed one sheet. I informed the pt I see the orders in her chart already. [facility] is [facility] the [facility] right now.

## 2024-06-30 NOTE — Telephone Encounter (Signed)
 Noted

## 2024-07-01 ENCOUNTER — Ambulatory Visit: Payer: Self-pay | Admitting: Family Medicine

## 2024-07-01 DIAGNOSIS — E890 Postprocedural hypothyroidism: Secondary | ICD-10-CM

## 2024-07-01 LAB — TSH: TSH: 46.4 u[IU]/mL — ABNORMAL HIGH (ref 0.450–4.500)

## 2024-07-01 LAB — T4, FREE: Free T4: 0.67 ng/dL — ABNORMAL LOW (ref 0.82–1.77)

## 2024-07-01 LAB — T3: T3, Total: 46 ng/dL — ABNORMAL LOW (ref 71–180)

## 2024-07-01 MED ORDER — LEVOTHYROXINE SODIUM 125 MCG PO TABS: 125.0000 ug | ORAL_TABLET | Freq: Every day | ORAL | 1 refills | Status: DC

## 2024-07-03 DIAGNOSIS — M7918 Myalgia, other site: Secondary | ICD-10-CM | POA: Diagnosis not present

## 2024-07-10 ENCOUNTER — Ambulatory Visit: Admitting: Family Medicine

## 2024-07-13 ENCOUNTER — Other Ambulatory Visit: Payer: Self-pay | Admitting: Family Medicine

## 2024-08-11 ENCOUNTER — Ambulatory Visit: Admitting: Family Medicine

## 2024-08-12 ENCOUNTER — Other Ambulatory Visit: Payer: Self-pay | Admitting: Family Medicine

## 2024-08-21 ENCOUNTER — Telehealth: Payer: Self-pay | Admitting: Family Medicine

## 2024-08-21 NOTE — Telephone Encounter (Signed)
 Copied from CRM #[id number]. Topic: General - Other >> Aug 21, 2024 11:44 AM Zebedee SAUNDERS wrote:  Reason for CRM: Received call from [employer] per [facility] ph: [phone] if request for pt's oxygen  was received on 12/11&15/2025 which shows it was in Media. Please complete request.

## 2024-08-21 NOTE — Telephone Encounter (Signed)
Order has been received and will be faxed once complete.

## 2024-09-02 ENCOUNTER — Ambulatory Visit: Admitting: Neurology

## 2024-09-09 ENCOUNTER — Ambulatory Visit: Admitting: Neurology

## 2024-09-16 ENCOUNTER — Ambulatory Visit

## 2024-09-16 DIAGNOSIS — I469 Cardiac arrest, cause unspecified: Secondary | ICD-10-CM

## 2024-09-17 ENCOUNTER — Encounter: Payer: Self-pay | Admitting: Family Medicine

## 2024-09-17 ENCOUNTER — Ambulatory Visit: Payer: Self-pay | Admitting: Student in an Organized Health Care Education/Training Program

## 2024-09-17 ENCOUNTER — Ambulatory Visit: Attending: Family Medicine | Admitting: Family Medicine

## 2024-09-17 VITALS — BP 93/61 | HR 90 | Ht 63.0 in | Wt 109.4 lb

## 2024-09-17 DIAGNOSIS — G7111 Myotonic muscular dystrophy: Secondary | ICD-10-CM | POA: Diagnosis not present

## 2024-09-17 DIAGNOSIS — I441 Atrioventricular block, second degree: Secondary | ICD-10-CM

## 2024-09-17 DIAGNOSIS — Z931 Gastrostomy status: Secondary | ICD-10-CM | POA: Diagnosis not present

## 2024-09-17 DIAGNOSIS — E890 Postprocedural hypothyroidism: Secondary | ICD-10-CM | POA: Diagnosis not present

## 2024-09-17 DIAGNOSIS — N951 Menopausal and female climacteric states: Secondary | ICD-10-CM | POA: Diagnosis not present

## 2024-09-17 LAB — CUP PACEART REMOTE DEVICE CHECK
Battery Remaining Longevity: 20 mo
Battery Voltage: 2.89 V
Brady Statistic AP VP Percent: 72.86 %
Brady Statistic AP VS Percent: 8.57 %
Brady Statistic AS VP Percent: 18.38 %
Brady Statistic AS VS Percent: 0.19 %
Brady Statistic RA Percent Paced: 81.42 %
Brady Statistic RV Percent Paced: 91.24 %
Date Time Interrogation Session: 20260113160648
Implantable Lead Connection Status: 753985
Implantable Lead Connection Status: 753985
Implantable Lead Implant Date: 20180614
Implantable Lead Implant Date: 20180614
Implantable Lead Location: 753859
Implantable Lead Location: 753860
Implantable Lead Model: 3830
Implantable Lead Model: 5076
Implantable Pulse Generator Implant Date: 20180614
Lead Channel Impedance Value: 285 Ohm
Lead Channel Impedance Value: 361 Ohm
Lead Channel Impedance Value: 399 Ohm
Lead Channel Impedance Value: 513 Ohm
Lead Channel Pacing Threshold Amplitude: 0.75 V
Lead Channel Pacing Threshold Amplitude: 1.5 V
Lead Channel Pacing Threshold Pulse Width: 0.4 ms
Lead Channel Pacing Threshold Pulse Width: 0.4 ms
Lead Channel Sensing Intrinsic Amplitude: 1.875 mV
Lead Channel Sensing Intrinsic Amplitude: 1.875 mV
Lead Channel Sensing Intrinsic Amplitude: 4.25 mV
Lead Channel Sensing Intrinsic Amplitude: 4.25 mV
Lead Channel Setting Pacing Amplitude: 2 V
Lead Channel Setting Pacing Amplitude: 2.5 V
Lead Channel Setting Pacing Pulse Width: 1 ms
Lead Channel Setting Sensing Sensitivity: 0.9 mV
Zone Setting Status: 755011
Zone Setting Status: 755011

## 2024-09-17 MED ORDER — VEOZAH 45 MG PO TABS: 1.0000 | ORAL_TABLET | Freq: Every day | ORAL | 1 refills | Status: AC

## 2024-09-17 NOTE — Progress Notes (Signed)
 "  Subjective:  Patient ID: Cynthia Underwood, female    DOB: [birth year]  Age: 54 y.o. MRN: [id number]  CC: Medical Management of Chronic Issues     Discussed the use of AI scribe software for clinical note transcription with the patient, who gave verbal consent to proceed.  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  History of PEG tube and  history of trach tube (status post removal), bronchiectasis.   She has ben adherent with Levothyroxine  but has been on 112mcg and never received the 125mcg even though it appears on her med list. She states her dose was increased from 100 to 112mcg and not 112 to as intended.  She sees pain management for chronic groin and leg pains. Cymbalta  appears on her med list but she denies taking it as she never received it. She Complains of hot flashes which are uncontrolled during the day and night, worse at night and sometimes it alternates with cold intolerance.  Past Medical History:  Diagnosis Date   Bifascicular block 10/02/2014   Cataract    Dyspnea    Excess ear wax    Multinodular goiter    Pneumonia    Presence of permanent cardiac pacemaker    RBBB (right bundle branch block with left anterior fascicular block) 10/02/2014   Sickle cell trait    Watery eyes    Left   Weakness of both legs     Past Surgical History:  Procedure Laterality Date   BREAST BIOPSY Right    ESOPHAGOGASTRODUODENOSCOPY N/A 02/19/2024   Procedure: EGD (ESOPHAGOGASTRODUODENOSCOPY);  Surgeon: Nandigam, Kavitha V, MD;  Location: [facility];  Service: Gastroenterology;  Laterality: N/A;   EYE SURGERY Left 2017   related to blockage  in my nose   EYE SURGERY Right 05/2019   INSERT / REPLACE / REMOVE PACEMAKER  02/15/2017   IR GASTROSTOMY TUBE MOD SED  01/01/2023   IR REPLACE G-TUBE SIMPLE WO FLUORO  12/13/2023   PACEMAKER IMPLANT N/A 02/15/2017   Procedure: Pacemaker Implant;  Surgeon: Fernande Elspeth BROCKS, MD;  Location: [facility] INVASIVE CV LAB;  Service: Cardiovascular;  Laterality: N/A;   RIGHT/LEFT HEART CATH AND CORONARY ANGIOGRAPHY N/A 11/16/2022   Procedure: RIGHT/LEFT HEART CATH AND CORONARY ANGIOGRAPHY;  Surgeon: Rolan Ezra RAMAN, MD;  Location: [facility] INVASIVE CV LAB;  Service: Cardiovascular;  Laterality: N/A;   THYROIDECTOMY N/A 04/21/2021   Procedure: TOTAL THYROIDECTOMY;  Surgeon: Rubin Calamity, MD;  Location: [facility];  Service: General;  Laterality: N/A;    Family History  Problem Relation Age of Onset   Cancer Father 4       Deceased   Heart disease Father    COPD Mother    Diabetes Maternal Uncle    Heart disease Maternal Uncle    Heart disease Paternal Grandmother    Diabetes Paternal Grandmother    Hypertension Paternal Grandmother    Neuromuscular disorder Maternal Aunt        Myotonic dystrophy   Neuromuscular disorder Cousin        Myotonic dystrophy   Neuromuscular disorder Sister        Myotonic dystrophy   Colon cancer Neg Hx    Colon polyps Neg Hx    Esophageal  cancer Neg Hx    Rectal cancer Neg Hx    Stomach cancer Neg Hx     Social History   Socioeconomic History   Marital status: Single    Spouse name: Not on file   Number of children: 0   Years of education: 1.5 Collge   Highest education level: Some college, no degree  Occupational History   Occupation: Unemployed    Employer: OTHER  Tobacco Use   Smoking status: Former    Current packs/day: 0.00    Types: Cigarettes    Start date: 10/04/1988    Quit date: 10/04/2016    Years since quitting: 7.9   Smokeless tobacco: Never  Vaping Use   Vaping status: Never Used  Substance and Sexual Activity   Alcohol use: Not Currently     Alcohol/week: 0.0 standard drinks of alcohol    Comment:  few times a year   Drug use: Not Currently    Types: Marijuana    Comment: occ    Sexual activity: Not Currently    Birth control/protection: None, Condom  Other Topics Concern   Not on file  Social History Narrative   Patient lives at home with her aunt.   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  She moved to [facility] in August 2015.   She has been on disability since 2010.   Education: 1 1/2 years of college.   Caffeine - coke 2 cans/day   Exercise - no   Social Drivers of Health   Tobacco Use: Medium Risk (09/17/2024)   Patient History    Smoking Tobacco Use: Former    Smokeless Tobacco Use: Never    Passive Exposure: Not on file  Financial Resource Strain: Low Risk (01/15/2024)   Overall Financial Resource Strain (CARDIA)    Difficulty of Paying Living Expenses: Not hard at all  Food Insecurity: No Food Insecurity (03/05/2024)   Epic    Worried About Radiation Protection Practitioner of Food in the Last Year: Never true    Ran Out of Food in the Last Year: Never true  Transportation Needs: No Transportation Needs (03/05/2024)   Epic    Lack of Transportation (Medical): No    Lack of Transportation (Non-Medical): No  Physical Activity: Patient Declined (01/15/2024)   Exercise Vital Sign    Days of Exercise per Week: Patient declined    Minutes of Exercise per Session: Patient declined  Stress: No Stress Concern Present (01/15/2024)   [employer] of Occupational Health - Occupational Stress Questionnaire    Feeling of Stress : Only a little  Social Connections: Unknown (01/15/2024)   Social Connection and Isolation Panel    Frequency of Communication with Friends and Family: More than three times a week    Frequency of Social Gatherings with Friends and Family: Twice a week    Attends Religious Services: Patient declined    Database Administrator or Organizations: No    Attends Banker Meetings: Never     Marital Status: Never married  Depression (PHQ2-9): Medium Risk (09/17/2024)   Depression (PHQ2-9)    PHQ-2 Score: 5  Alcohol Screen: Low Risk (01/15/2024)   Alcohol Screen    Last Alcohol Screening Score (AUDIT): 0  Housing: Low Risk (03/05/2024)   Epic    Unable to Pay for Housing in the Last Year: No    Number of Times Moved in the Last Year: 0    Homeless in the Last Year: No  Utilities: Not At Risk (03/05/2024)   Epic    Threatened with loss of utilities: No  Health Literacy: Adequate Health Literacy (01/15/2024)   B1300 Health Literacy    Frequency of need for help with medical instructions: Never    Allergies[1]  Outpatient Medications Prior to Visit  Medication Sig Dispense Refill   acetaminophen  (TYLENOL ) 500 MG tablet Place 500 mg into feeding tube every 6 (six) hours as needed for mild pain.     DULoxetine  HCl (DRIZALMA SPRINKLE ) 20 MG CSDR Open capsule and mix pellets with 50mL water , then administer via PEG. 30 capsule 5   esomeprazole  (NEXIUM ) 40 MG packet Take 40 mg by mouth daily before breakfast. 90 each 3   levothyroxine  (SYNTHROID ) 125 MCG tablet Place 1 tablet (125 mcg total) into feeding tube daily before breakfast. 90 tablet 1   lidocaine  (LIDODERM ) 5 % Place 1 patch onto the skin daily. Remove & Discard patch within 12 hours or as directed by MD 30 patch 0   metoprolol  tartrate (LOPRESSOR ) 25 MG tablet PLACE 0.5 TABLETS (12.5 MG TOTAL) INTO FEEDING TUBE 2 (TWO) TIMES DAILY. 90 tablet 1   Misc. Devices MISC Eval & titrate pt for poc for portable oxygen  container. If patient qualifies, dispense at 1-6 pulse dose. Please fax to [phone]. Diagnosis-aspiration pneumonia 1 each 0   Misc. Devices MISC Shower chair.  Diagnosis myotonic dystrophy 1 each 0   modafinil  (PROVIGIL ) 100 MG tablet Take 1 tablet (100 mg total) by mouth daily. 30 tablet 3   Nutritional Supplements (FEEDING SUPPLEMENT, JEVITY 1.5 CAL/FIBER,) LIQD Place 237 mLs into feeding tube 4 (four) times  daily. FWF before and after each feeding 1000 mL 12   spironolactone  (ALDACTONE ) 25 MG tablet PLACE 0.5 TABLETS (12.5 MG TOTAL) INTO FEEDING TUBE DAILY. 45 tablet 1   Water  For Irrigation, Sterile (FREE WATER ) SOLN 100ml free water  via tube before and after each feeding     metoCLOPramide  (REGLAN ) 5 MG tablet PLACE 1 TABLET (5 MG TOTAL) INTO FEEDING TUBE 3 (THREE) TIMES DAILY BEFORE MEALS. 60 tablet 0   Iron-Vitamins (GERITOL) LIQD Take 10 mLs by mouth 2 (two) times daily. (Patient not taking: Reported on 09/17/2024) 354 mL 1   No facility-administered medications prior to visit.     ROS Review of Systems  Constitutional:  Negative for activity change and appetite change.  HENT:  Negative for sinus pressure and sore throat.   Respiratory:  Negative for chest tightness, shortness of breath and wheezing.   Cardiovascular:  Negative for chest pain and palpitations.  Gastrointestinal:  Negative for abdominal distention, abdominal pain and constipation.  Genitourinary: Negative.   Musculoskeletal:        See HPI  Psychiatric/Behavioral:  Negative for behavioral problems and dysphoric mood.     Objective:  BP 93/61   Pulse 90   Ht 5' 3 (1.6 m)   Wt 109 lb 6.4 oz (49.6 kg)   LMP 12/03/2016   SpO2 95%   BMI 19.38 kg/m      09/17/2024    3:11 PM 06/17/2024   11:02 AM 05/24/2024    5:57 PM  BP/Weight  Systolic BP 93 92   Diastolic BP 61 57   Wt. (Lbs) 109.4  100  BMI 19.38 kg/m2  17.71 kg/m2      Physical Exam Constitutional:      Appearance: She is well-developed.     Comments: Underweight  Cardiovascular:     Rate and Rhythm: Normal rate.  Heart sounds: Normal heart sounds. No murmur heard. Pulmonary:     Effort: Pulmonary effort is normal.     Breath sounds: Normal breath sounds. No wheezing or rales.  Chest:     Chest wall: No tenderness.  Abdominal:     General: Bowel sounds are normal. There is no distension.     Palpations: Abdomen is soft. There is no  mass.     Tenderness: There is no abdominal tenderness.     Comments: PEG tube in place  Musculoskeletal:        General: Normal range of motion.     Right lower leg: No edema.     Left lower leg: No edema.  Neurological:     Mental Status: She is alert and oriented to person, place, and time.  Psychiatric:        Mood and Affect: Mood normal.        Latest Ref Rng & Units 03/22/2024    3:00 PM 02/15/2024    9:43 AM 08/09/2023    8:38 PM  CMP  Glucose 70 - 99 mg/dL 81  88  896   BUN 6 - 20 mg/dL 15  16  12    Creatinine 0.44 - 1.00 mg/dL 9.22  9.29  9.45   Sodium 135 - 145 mmol/L 143  144  141   Potassium 3.5 - 5.1 mmol/L 3.9  4.4  4.8   Chloride 98 - 111 mmol/L 104  98  104   CO2 22 - 32 mmol/L 28  24  28    Calcium  8.9 - 10.3 mg/dL 89.4  9.8  9.6   Total Protein 6.5 - 8.1 g/dL   7.5   Total Bilirubin <1.2 mg/dL   0.9   Alkaline Phos 38 - 126 U/L   51   AST 15 - 41 U/L   34   ALT 0 - 44 U/L   21     Lipid Panel     Component Value Date/Time   CHOL 214 (H) 01/10/2018 0847   TRIG 113 01/10/2018 0847   HDL 57 01/10/2018 0847   CHOLHDL 3.8 01/10/2018 0847   CHOLHDL 3.5 08/26/2014 1516   VLDL 20 08/26/2014 1516   LDLCALC 134 (H) 01/10/2018 0847    CBC    Component Value Date/Time   WBC 4.6 03/22/2024 1500   RBC 4.49 03/22/2024 1500   HGB 14.1 03/22/2024 1500   HGB 12.6 10/04/2022 1638   HCT 43.3 03/22/2024 1500   HCT 37.6 10/04/2022 1638   PLT 174 03/22/2024 1500   PLT 192 10/04/2022 1638   MCV 96.4 03/22/2024 1500   MCV 94 10/04/2022 1638   MCH 31.4 03/22/2024 1500   MCHC 32.6 03/22/2024 1500   RDW 14.2 03/22/2024 1500   RDW 15.1 10/04/2022 1638   LYMPHSABS 1.9 08/09/2023 2038   LYMPHSABS 2.1 10/04/2022 1638   MONOABS 0.4 08/09/2023 2038   EOSABS 0.1 08/09/2023 2038   EOSABS 0.1 10/04/2022 1638   BASOSABS 0.0 08/09/2023 2038   BASOSABS 0.0 10/04/2022 1638    Lab Results  Component Value Date   HGBA1C 5.6 11/15/2022    Lab Results  Component Value  Date   TSH 46.400 (H) 06/30/2024      1. Postoperative hypothyroidism (Primary) Uncontrolled from last set of labs Will order thyroid  panel and adjust regimen accordingly - T4, free - TSH - T3  2. Myotonic dystrophy, type 1 (HCC) With muscle wasting and mobility issues Ambulating with a walker  Continue to follow-up with neurology  3. S/P percutaneous endoscopic gastrostomy (PEG) tube placement (HCC) Able Continue regular feeds  4. Mobitz type 2 second degree heart block Status post pacemaker Continue spironolactone  and metoprolol   5. Vasomotor symptoms due to menopause Placed on Veozah  If not covered we will consider switching her to Effexor Cymbalta  appears on her med list from neurology but she states she has not been taking it. - Basic Metabolic Panel     Meds ordered this encounter  Medications   Fezolinetant  (VEOZAH ) 45 MG TABS    Sig: Take 1 tablet (45 mg total) by mouth daily.    Dispense:  90 tablet    Refill:  1    Follow-up: 6 months for chronic medical conditions      Corrina Sabin, MD, FAAFP. [facility] [place], [place] [phone]   09/17/2024, 6:17 PM     [1]  Allergies Allergen Reactions   Penicillins Nausea And Vomiting, Rash and Other (See Comments)    Patient tolerates cefdinir , cefepime  and ceftriaxone   Has patient had a PCN reaction causing immediate rash, facial/tongue/throat swelling, SOB or lightheadedness with hypotension: Yes Has patient had a PCN reaction causing severe rash involving mucus membranes or skin necrosis: Unknown Has patient had a PCN reaction that required hospitalization: No Has patient had a PCN reaction occurring within the last 10 years: No If all of the above answers are NO, then may proceed with Cephalosporin use.    "

## 2024-09-17 NOTE — Patient Instructions (Signed)
 Menopause: What to Know Menopause is the time in your life when your menstrual periods stop. It marks the end of your ability to get pregnant. It can be defined as not having a period for 12 months without another medical cause. The time when you start to move into menopause is called perimenopause. It often happens between ages 67-55. It can last for many years. During perimenopause, hormone levels change in your body. This can cause symptoms and affect your health. Menopause may make you more likely to have: Bones that are weak and break more easily. Depression. This is when you feel sad or hopeless. Arteries that harden and get narrow. These can cause heart attacks and strokes. What are the causes? In most cases, menopause is a natural change to your body and hormone levels that happens as you get older. But in some cases, it may be caused by changes that aren't natural. These include: Surgery to take out both ovaries. Side effects from some medicines. What increases the risk? You're more likely to go through menopause early if: You have an abnormal growth (tumor) of the pituitary gland in your brain. You have a disease that affects your ovaries. You've had certain treatments for cancer. These include: Chemotherapy. Hormone therapy. Radiation therapy on the area between your hips (pelvis). You smoke a lot or drink a lot of alcohol. Other people in your family have gone through menopause early. You're very thin. What are the signs or symptoms? You may have: Hot flashes. Irregular periods. Night sweats. Changes in how you feel about sex. You may: Have less of a sex drive. Feel more discomfort around your sexuality. Vaginal dryness and thinning of the vaginal walls. This may make it hurt to have sex. Skin changes, such as: Dry skin. New wrinkles. Headaches. Other symptoms may include: Trouble sleeping. Mood swings. Memory problems. Weight gain. Hair growth on your face and  chest. Bladder infections or trouble peeing. How is this diagnosed? You may be diagnosed based on: Your medical history. An exam. Your age. Your history of menstrual periods. Your symptoms. Hormone tests. How is this treated? In some cases, no treatment is needed. Talk with your health care provider about if you should get treated. Treatments may include: Menopausal hormone therapy (MHT). Medicines to treat certain symptoms. Acupuncture. Vitamin or herbal supplements. Before you start treatment, let your provider know if you or anyone in your family has or has had: Heart disease. Breast cancer. Blood clots. Diabetes. Osteoporosis. Follow these instructions at home: Eating and drinking  Eat a balanced diet. It should include: Fresh fruits and vegetables. Whole grains. Lean protein. Low-fat dairy. Eat lots of foods that have calcium and vitamin D in them. These can help keep your bones healthy. Foods and drinks that are rich in calcium include: Yogurt and low-fat milk. Beans. Almonds. Sardines. Broccoli and kale. To help prevent hot flashes, stay away from: Alcohol. Drinks with caffeine in them. Spicy foods. Lifestyle Do not smoke, vape, or use nicotine or tobacco. Get 7-8 hours of sleep each night. If you have hot flashes, you may want to: Dress in layers. Avoid things that may trigger hot flashes, like warm places or stress. Take slow, deep breaths when a hot flash starts. Keep a fan in your home and office. Find ways to manage stress. You may want to try: Deep breathing. Meditation. Writing in a journal. Ask your provider about going to group therapy. Therapy can help you get support from others who are going  through menopause. General instructions  Talk with your provider before you take any herbal supplements. Keep track of your symptoms. Track: When they start. How often you have them. How long they last. Use vaginal lubricants or moisturizers. These  can help with: Vaginal dryness. Comfort during sex. Contact a health care provider if: You're older than 55 and still get periods. You have pain during sex. You haven't had a period for 12 months and then start to bleed from your vagina. It hurts to pee. You get very bad headaches. Get help right away if: You're very depressed. You have a lot of bleeding from your vagina. Your heart is beating too fast. You have very bad belly pain or indigestion that doesn't go away with medicines. This information is not intended to replace advice given to you by your health care provider. Make sure you discuss any questions you have with your health care provider. Document Revised: 04/26/2023 Document Reviewed: 04/26/2023 Elsevier Patient Education  2024 [employer].

## 2024-09-18 ENCOUNTER — Encounter: Payer: Self-pay | Admitting: Family Medicine

## 2024-09-18 ENCOUNTER — Ambulatory Visit: Payer: Self-pay | Admitting: Family Medicine

## 2024-09-18 LAB — BASIC METABOLIC PANEL WITH GFR
BUN/Creatinine Ratio: 23 (ref 9–23)
BUN: 13 mg/dL (ref 6–24)
CO2: 25 mmol/L (ref 20–29)
Calcium: 9.9 mg/dL (ref 8.7–10.2)
Chloride: 108 mmol/L — ABNORMAL HIGH (ref 96–106)
Creatinine, Ser: 0.57 mg/dL (ref 0.57–1.00)
Glucose: 82 mg/dL (ref 70–99)
Potassium: 5 mmol/L (ref 3.5–5.2)
Sodium: 149 mmol/L — ABNORMAL HIGH (ref 134–144)
eGFR: 107 mL/min/1.73

## 2024-09-18 LAB — TSH: TSH: 15.3 u[IU]/mL — ABNORMAL HIGH (ref 0.450–4.500)

## 2024-09-18 LAB — T4, FREE: Free T4: 0.88 ng/dL (ref 0.82–1.77)

## 2024-09-18 LAB — T3: T3, Total: 85 ng/dL (ref 71–180)

## 2024-09-18 MED ORDER — LEVOTHYROXINE SODIUM 125 MCG PO TABS: 125.0000 ug | ORAL_TABLET | Freq: Every day | ORAL | 1 refills | Status: AC

## 2024-09-19 NOTE — Progress Notes (Signed)
 Remote PPM Transmission

## 2024-09-25 ENCOUNTER — Telehealth: Payer: Self-pay

## 2024-09-25 NOTE — Telephone Encounter (Signed)
 Pharmacy Patient Advocate Encounter   Received notification from CoverMyMeds that prior authorization for VEOZAH  is required/requested.   Insurance verification completed.   The patient is insured through [facility].   Per test claim: PA required; PA submitted to above mentioned insurance via CoverMyMeds Key/confirmation #/[id number] Status is pending

## 2024-09-25 NOTE — Telephone Encounter (Signed)
 Pharmacy Patient Advocate Encounter  Received notification from [employer] that Prior Authorization for VEOZAH  has been APPROVED from 09/25/2024 to 03/25/2025   PA #/Case ID/Reference #: [id number]

## 2024-09-26 ENCOUNTER — Other Ambulatory Visit: Payer: Self-pay

## 2024-10-06 ENCOUNTER — Ambulatory Visit: Admitting: Family Medicine

## 2024-10-06 ENCOUNTER — Telehealth: Payer: Self-pay | Admitting: Family Medicine

## 2024-10-06 NOTE — Telephone Encounter (Signed)
 Contacted pt left vm to resch appt our office closed due to the weather, if the pt calls back please resch appt

## 2024-11-27 ENCOUNTER — Ambulatory Visit: Payer: Self-pay | Admitting: Family Medicine

## 2024-12-16 ENCOUNTER — Ambulatory Visit

## 2024-12-24 ENCOUNTER — Ambulatory Visit: Admitting: Neurology

## 2025-01-20 ENCOUNTER — Ambulatory Visit: Payer: Self-pay

## 2025-03-17 ENCOUNTER — Ambulatory Visit

## 2025-06-16 ENCOUNTER — Ambulatory Visit
